# Patient Record
Sex: Male | Born: 1940 | Race: White | Hispanic: No | Marital: Married | Smoking: Former smoker
Health system: Southern US, Community
[De-identification: ages and names within clinical notes are randomized; demographics above are authoritative.]

## PROBLEM LIST (undated history)

## (undated) DIAGNOSIS — M545 Low back pain, unspecified: Secondary | ICD-10-CM

## (undated) DIAGNOSIS — I255 Ischemic cardiomyopathy: Secondary | ICD-10-CM

## (undated) DIAGNOSIS — I509 Heart failure, unspecified: Secondary | ICD-10-CM

## (undated) DIAGNOSIS — I4821 Permanent atrial fibrillation: Secondary | ICD-10-CM

## (undated) DIAGNOSIS — I350 Nonrheumatic aortic (valve) stenosis: Secondary | ICD-10-CM

## (undated) DIAGNOSIS — I499 Cardiac arrhythmia, unspecified: Secondary | ICD-10-CM

## (undated) DIAGNOSIS — E7800 Pure hypercholesterolemia, unspecified: Secondary | ICD-10-CM

## (undated) DIAGNOSIS — I482 Chronic atrial fibrillation, unspecified: Secondary | ICD-10-CM

## (undated) DIAGNOSIS — I2510 Atherosclerotic heart disease of native coronary artery without angina pectoris: Secondary | ICD-10-CM

## (undated) DIAGNOSIS — I10 Essential (primary) hypertension: Secondary | ICD-10-CM

## (undated) DIAGNOSIS — G609 Hereditary and idiopathic neuropathy, unspecified: Secondary | ICD-10-CM

## (undated) DIAGNOSIS — E785 Hyperlipidemia, unspecified: Secondary | ICD-10-CM

## (undated) DIAGNOSIS — I4892 Unspecified atrial flutter: Secondary | ICD-10-CM

## (undated) DIAGNOSIS — G8929 Other chronic pain: Secondary | ICD-10-CM

## (undated) HISTORY — DX: Permanent atrial fibrillation: I48.21

## (undated) HISTORY — PX: BACK SURGERY: SHX140

## (undated) HISTORY — DX: Heart failure, unspecified: I50.9

## (undated) HISTORY — PX: REVISION TOTAL HIP ARTHROPLASTY: SHX766

## (undated) HISTORY — PX: SHOULDER OPEN ROTATOR CUFF REPAIR: SHX2407

## (undated) HISTORY — DX: Hyperlipidemia, unspecified: E78.5

## (undated) HISTORY — DX: Hereditary and idiopathic neuropathy, unspecified: G60.9

## (undated) HISTORY — PX: LUMBAR DISC SURGERY: SHX700

## (undated) HISTORY — DX: Nonrheumatic aortic (valve) stenosis: I35.0

## (undated) HISTORY — DX: Atherosclerotic heart disease of native coronary artery without angina pectoris: I25.10

## (undated) HISTORY — PX: JOINT REPLACEMENT: SHX530

## (undated) HISTORY — PX: CARDIAC CATHETERIZATION: SHX172

---
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
## 1997-09-03 HISTORY — PX: TOTAL HIP ARTHROPLASTY: SHX124

## 1998-01-17 ENCOUNTER — Ambulatory Visit (HOSPITAL_COMMUNITY): Admission: RE | Admit: 1998-01-17 | Discharge: 1998-01-17 | Payer: Self-pay | Admitting: Cardiovascular Disease

## 1998-04-11 ENCOUNTER — Inpatient Hospital Stay (HOSPITAL_COMMUNITY): Admission: AD | Admit: 1998-04-11 | Discharge: 1998-04-12 | Payer: Self-pay | Admitting: Psychiatry

## 1998-05-15 ENCOUNTER — Inpatient Hospital Stay (HOSPITAL_COMMUNITY): Admission: RE | Admit: 1998-05-15 | Discharge: 1998-05-17 | Payer: Self-pay | Admitting: Cardiology

## 1998-08-01 ENCOUNTER — Ambulatory Visit (HOSPITAL_COMMUNITY): Admission: RE | Admit: 1998-08-01 | Discharge: 1998-08-01 | Payer: Self-pay | Admitting: Cardiology

## 1998-08-08 ENCOUNTER — Ambulatory Visit (HOSPITAL_COMMUNITY): Admission: RE | Admit: 1998-08-08 | Discharge: 1998-08-08 | Payer: Self-pay | Admitting: Neurosurgery

## 1998-08-08 ENCOUNTER — Encounter: Payer: Self-pay | Admitting: Neurosurgery

## 1998-09-07 ENCOUNTER — Encounter: Payer: Self-pay | Admitting: Neurosurgery

## 1998-09-09 ENCOUNTER — Encounter: Payer: Self-pay | Admitting: Neurosurgery

## 1998-09-09 ENCOUNTER — Inpatient Hospital Stay (HOSPITAL_COMMUNITY): Admission: RE | Admit: 1998-09-09 | Discharge: 1998-09-10 | Payer: Self-pay | Admitting: Neurosurgery

## 2001-07-16 ENCOUNTER — Encounter: Payer: Self-pay | Admitting: *Deleted

## 2001-07-16 ENCOUNTER — Inpatient Hospital Stay (HOSPITAL_COMMUNITY): Admission: EM | Admit: 2001-07-16 | Discharge: 2001-07-22 | Payer: Self-pay | Admitting: *Deleted

## 2001-07-18 ENCOUNTER — Encounter: Payer: Self-pay | Admitting: Thoracic Surgery (Cardiothoracic Vascular Surgery)

## 2001-07-19 ENCOUNTER — Encounter: Payer: Self-pay | Admitting: Thoracic Surgery (Cardiothoracic Vascular Surgery)

## 2001-07-20 ENCOUNTER — Encounter: Payer: Self-pay | Admitting: Thoracic Surgery (Cardiothoracic Vascular Surgery)

## 2001-08-11 ENCOUNTER — Encounter: Payer: Self-pay | Admitting: Thoracic Surgery (Cardiothoracic Vascular Surgery)

## 2001-08-11 ENCOUNTER — Encounter
Admission: RE | Admit: 2001-08-11 | Discharge: 2001-08-11 | Payer: Self-pay | Admitting: Thoracic Surgery (Cardiothoracic Vascular Surgery)

## 2001-09-03 HISTORY — PX: CORONARY ARTERY BYPASS GRAFT: SHX141

## 2001-09-08 ENCOUNTER — Encounter (HOSPITAL_COMMUNITY): Admission: RE | Admit: 2001-09-08 | Discharge: 2001-10-08 | Payer: Self-pay | Admitting: *Deleted

## 2001-10-09 ENCOUNTER — Encounter (HOSPITAL_COMMUNITY): Admission: RE | Admit: 2001-10-09 | Discharge: 2001-11-08 | Payer: Self-pay | Admitting: *Deleted

## 2001-11-10 ENCOUNTER — Encounter (HOSPITAL_COMMUNITY): Admission: RE | Admit: 2001-11-10 | Discharge: 2001-12-10 | Payer: Self-pay | Admitting: *Deleted

## 2001-11-18 ENCOUNTER — Ambulatory Visit (HOSPITAL_COMMUNITY): Admission: RE | Admit: 2001-11-18 | Discharge: 2001-11-18 | Payer: Self-pay | Admitting: Urology

## 2001-11-18 ENCOUNTER — Encounter: Payer: Self-pay | Admitting: Urology

## 2001-12-12 ENCOUNTER — Encounter (HOSPITAL_COMMUNITY): Admission: RE | Admit: 2001-12-12 | Discharge: 2002-01-11 | Payer: Self-pay | Admitting: *Deleted

## 2002-06-04 ENCOUNTER — Ambulatory Visit (HOSPITAL_COMMUNITY): Admission: RE | Admit: 2002-06-04 | Discharge: 2002-06-04 | Payer: Self-pay | Admitting: Internal Medicine

## 2002-06-04 HISTORY — PX: COLONOSCOPY: SHX174

## 2002-07-24 ENCOUNTER — Encounter: Payer: Self-pay | Admitting: *Deleted

## 2002-07-24 ENCOUNTER — Ambulatory Visit (HOSPITAL_COMMUNITY): Admission: RE | Admit: 2002-07-24 | Discharge: 2002-07-24 | Payer: Self-pay | Admitting: *Deleted

## 2002-11-23 ENCOUNTER — Ambulatory Visit (HOSPITAL_COMMUNITY): Admission: RE | Admit: 2002-11-23 | Discharge: 2002-11-23 | Payer: Self-pay | Admitting: *Deleted

## 2003-05-03 ENCOUNTER — Encounter: Payer: Self-pay | Admitting: Family Medicine

## 2003-05-03 ENCOUNTER — Ambulatory Visit (HOSPITAL_COMMUNITY): Admission: RE | Admit: 2003-05-03 | Discharge: 2003-05-03 | Payer: Self-pay | Admitting: Family Medicine

## 2003-05-27 ENCOUNTER — Ambulatory Visit (HOSPITAL_COMMUNITY): Admission: RE | Admit: 2003-05-27 | Discharge: 2003-05-27 | Payer: Self-pay | Admitting: Orthopedic Surgery

## 2003-06-01 ENCOUNTER — Encounter (HOSPITAL_COMMUNITY): Admission: RE | Admit: 2003-06-01 | Discharge: 2003-07-01 | Payer: Self-pay | Admitting: Orthopedic Surgery

## 2003-07-06 ENCOUNTER — Encounter (HOSPITAL_COMMUNITY): Admission: RE | Admit: 2003-07-06 | Discharge: 2003-08-05 | Payer: Self-pay | Admitting: Orthopedic Surgery

## 2004-01-18 ENCOUNTER — Encounter: Admission: RE | Admit: 2004-01-18 | Discharge: 2004-01-18 | Payer: Self-pay | Admitting: Orthopedic Surgery

## 2004-05-03 ENCOUNTER — Inpatient Hospital Stay (HOSPITAL_COMMUNITY): Admission: RE | Admit: 2004-05-03 | Discharge: 2004-05-06 | Payer: Self-pay | Admitting: Orthopedic Surgery

## 2004-06-20 ENCOUNTER — Ambulatory Visit (HOSPITAL_COMMUNITY): Admission: RE | Admit: 2004-06-20 | Discharge: 2004-06-20 | Payer: Self-pay | Admitting: Urology

## 2005-07-02 ENCOUNTER — Ambulatory Visit: Payer: Self-pay | Admitting: Internal Medicine

## 2005-07-10 ENCOUNTER — Ambulatory Visit (HOSPITAL_COMMUNITY): Admission: RE | Admit: 2005-07-10 | Discharge: 2005-07-10 | Payer: Self-pay | Admitting: Internal Medicine

## 2005-07-25 ENCOUNTER — Ambulatory Visit: Payer: Self-pay | Admitting: Internal Medicine

## 2005-11-23 ENCOUNTER — Ambulatory Visit: Payer: Self-pay | Admitting: Internal Medicine

## 2005-12-12 ENCOUNTER — Ambulatory Visit: Payer: Self-pay | Admitting: Internal Medicine

## 2005-12-19 ENCOUNTER — Ambulatory Visit: Payer: Self-pay | Admitting: Internal Medicine

## 2005-12-19 ENCOUNTER — Ambulatory Visit (HOSPITAL_COMMUNITY): Admission: RE | Admit: 2005-12-19 | Discharge: 2005-12-20 | Payer: Self-pay | Admitting: Internal Medicine

## 2006-01-08 ENCOUNTER — Ambulatory Visit: Payer: Self-pay | Admitting: Internal Medicine

## 2006-01-18 ENCOUNTER — Ambulatory Visit: Payer: Self-pay | Admitting: Internal Medicine

## 2006-03-06 ENCOUNTER — Emergency Department (HOSPITAL_COMMUNITY): Admission: EM | Admit: 2006-03-06 | Discharge: 2006-03-06 | Payer: Self-pay | Admitting: Emergency Medicine

## 2006-07-16 ENCOUNTER — Ambulatory Visit: Payer: Self-pay | Admitting: Internal Medicine

## 2006-09-03 HISTORY — PX: TOTAL HIP ARTHROPLASTY: SHX124

## 2006-09-04 ENCOUNTER — Inpatient Hospital Stay (HOSPITAL_COMMUNITY): Admission: RE | Admit: 2006-09-04 | Discharge: 2006-09-07 | Payer: Self-pay | Admitting: Orthopedic Surgery

## 2010-05-03 ENCOUNTER — Encounter (HOSPITAL_COMMUNITY): Admission: RE | Admit: 2010-05-03 | Discharge: 2010-05-03 | Payer: Self-pay | Admitting: Orthopedic Surgery

## 2010-11-02 DIAGNOSIS — I509 Heart failure, unspecified: Secondary | ICD-10-CM

## 2010-11-02 HISTORY — DX: Heart failure, unspecified: I50.9

## 2010-11-28 ENCOUNTER — Other Ambulatory Visit (HOSPITAL_COMMUNITY): Payer: Self-pay | Admitting: Orthopedic Surgery

## 2010-11-28 ENCOUNTER — Other Ambulatory Visit: Payer: Self-pay | Admitting: Orthopedic Surgery

## 2010-11-28 ENCOUNTER — Ambulatory Visit (HOSPITAL_COMMUNITY)
Admission: RE | Admit: 2010-11-28 | Discharge: 2010-11-28 | Disposition: A | Discharge status: Home / Self Care | Payer: Medicare Other | Source: Ambulatory Visit | Attending: Orthopedic Surgery | Admitting: Orthopedic Surgery

## 2010-11-28 ENCOUNTER — Encounter (HOSPITAL_COMMUNITY): Payer: Medicare Other

## 2010-11-28 DIAGNOSIS — T84038A Mechanical loosening of other internal prosthetic joint, initial encounter: Secondary | ICD-10-CM

## 2010-11-28 DIAGNOSIS — T84099A Other mechanical complication of unspecified internal joint prosthesis, initial encounter: Secondary | ICD-10-CM | POA: Insufficient documentation

## 2010-11-28 DIAGNOSIS — Y831 Surgical operation with implant of artificial internal device as the cause of abnormal reaction of the patient, or of later complication, without mention of misadventure at the time of the procedure: Secondary | ICD-10-CM | POA: Insufficient documentation

## 2010-11-28 DIAGNOSIS — I517 Cardiomegaly: Secondary | ICD-10-CM | POA: Insufficient documentation

## 2010-11-28 DIAGNOSIS — Z01818 Encounter for other preprocedural examination: Secondary | ICD-10-CM | POA: Insufficient documentation

## 2010-11-28 DIAGNOSIS — Z96649 Presence of unspecified artificial hip joint: Secondary | ICD-10-CM

## 2010-11-28 DIAGNOSIS — Z01812 Encounter for preprocedural laboratory examination: Secondary | ICD-10-CM | POA: Insufficient documentation

## 2010-11-28 LAB — COMPREHENSIVE METABOLIC PANEL
AST: 23 U/L (ref 0–37)
Albumin: 4.4 g/dL (ref 3.5–5.2)
Calcium: 9.4 mg/dL (ref 8.4–10.5)
Chloride: 106 mEq/L (ref 96–112)
Creatinine, Ser: 0.8 mg/dL (ref 0.4–1.5)
GFR calc non Af Amer: >60 mL/min (ref >60)
Total Protein: 7.5 g/dL (ref 6.0–8.3)

## 2010-11-28 LAB — URINALYSIS, ROUTINE W REFLEX MICROSCOPIC
Hgb urine dipstick: NEGATIVE
Nitrite: NEGATIVE
Protein, ur: NEGATIVE mg/dL
Specific Gravity, Urine: 1.015 (ref 1.005–1.030)

## 2010-11-28 LAB — CBC
HCT: 43.5 % (ref 39.0–52.0)
Hemoglobin: 14.3 g/dL (ref 13.0–17.0)
RBC: 4.5 MIL/uL (ref 4.22–5.81)
WBC: 5.8 10*3/uL (ref 4.0–10.5)

## 2010-11-28 LAB — PROTIME-INR: Prothrombin Time: 24.4 seconds — ABNORMAL HIGH (ref 11.6–15.2)

## 2010-12-06 ENCOUNTER — Inpatient Hospital Stay (HOSPITAL_COMMUNITY): Payer: Medicare Other

## 2010-12-06 ENCOUNTER — Inpatient Hospital Stay (HOSPITAL_COMMUNITY)
Admission: RE | Admit: 2010-12-06 | Discharge: 2010-12-08 | DRG: 468 | Disposition: A | Discharge status: Home Health | Payer: Medicare Other | Source: Ambulatory Visit | Attending: Orthopedic Surgery | Admitting: Orthopedic Surgery

## 2010-12-06 DIAGNOSIS — I4891 Unspecified atrial fibrillation: Secondary | ICD-10-CM | POA: Diagnosis present

## 2010-12-06 DIAGNOSIS — M1610 Unilateral primary osteoarthritis, unspecified hip: Secondary | ICD-10-CM | POA: Diagnosis present

## 2010-12-06 DIAGNOSIS — Z96649 Presence of unspecified artificial hip joint: Secondary | ICD-10-CM

## 2010-12-06 DIAGNOSIS — T84099A Other mechanical complication of unspecified internal joint prosthesis, initial encounter: Principal | ICD-10-CM | POA: Diagnosis present

## 2010-12-06 DIAGNOSIS — I2510 Atherosclerotic heart disease of native coronary artery without angina pectoris: Secondary | ICD-10-CM | POA: Diagnosis present

## 2010-12-06 DIAGNOSIS — Z951 Presence of aortocoronary bypass graft: Secondary | ICD-10-CM

## 2010-12-06 DIAGNOSIS — Y831 Surgical operation with implant of artificial internal device as the cause of abnormal reaction of the patient, or of later complication, without mention of misadventure at the time of the procedure: Secondary | ICD-10-CM | POA: Diagnosis present

## 2010-12-06 DIAGNOSIS — M169 Osteoarthritis of hip, unspecified: Secondary | ICD-10-CM | POA: Diagnosis present

## 2010-12-06 DIAGNOSIS — Z01812 Encounter for preprocedural laboratory examination: Secondary | ICD-10-CM

## 2010-12-06 LAB — GRAM STAIN

## 2010-12-06 LAB — PROTIME-INR
INR: 1.09 (ref 0.00–1.49)
Prothrombin Time: 14.3 seconds (ref 11.6–15.2)

## 2010-12-06 LAB — TYPE AND SCREEN

## 2010-12-07 LAB — BASIC METABOLIC PANEL
BUN: 13 mg/dL (ref 6–23)
CO2: 28 mEq/L (ref 19–32)
Calcium: 8.9 mg/dL (ref 8.4–10.5)
Chloride: 103 mEq/L (ref 96–112)
Creatinine, Ser: 0.9 mg/dL (ref 0.4–1.5)
GFR calc Af Amer: >60 mL/min (ref >60)
Glucose, Bld: 182 mg/dL — ABNORMAL HIGH (ref 70–99)

## 2010-12-07 LAB — CBC
HCT: 36.6 % — ABNORMAL LOW (ref 39.0–52.0)
MCH: 31.8 pg (ref 26.0–34.0)
MCHC: 33.3 g/dL (ref 30.0–36.0)
MCV: 95.3 fL (ref 78.0–100.0)
Platelets: 152 10*3/uL (ref 150–400)
RDW: 12.5 % (ref 11.5–15.5)
WBC: 8.7 10*3/uL (ref 4.0–10.5)

## 2010-12-08 LAB — BASIC METABOLIC PANEL
BUN: 15 mg/dL (ref 6–23)
Chloride: 108 mEq/L (ref 96–112)
Creatinine, Ser: 0.91 mg/dL (ref 0.4–1.5)
GFR calc non Af Amer: >60 mL/min (ref >60)
Glucose, Bld: 161 mg/dL — ABNORMAL HIGH (ref 70–99)
Potassium: 4.6 mEq/L (ref 3.5–5.1)

## 2010-12-08 LAB — PROTIME-INR: Prothrombin Time: 14.8 seconds (ref 11.6–15.2)

## 2010-12-08 LAB — CBC
Hemoglobin: 10.9 g/dL — ABNORMAL LOW (ref 13.0–17.0)
MCH: 32.3 pg (ref 26.0–34.0)
MCHC: 33.1 g/dL (ref 30.0–36.0)
RDW: 12.9 % (ref 11.5–15.5)

## 2010-12-10 LAB — BODY FLUID CULTURE

## 2010-12-11 LAB — ANAEROBIC CULTURE: Gram Stain: NONE SEEN

## 2010-12-11 NOTE — H&P (Addendum)
NAME:  Leonard, Cisneros NO.:  [id number]  MEDICAL RECORD NO.:  [id number]           PATIENT TYPE:  LOCATION:                                 FACILITY:  PHYSICIAN:  Ollen Gross, M.D.    DATE OF BIRTH:  1941-09-03  DATE OF ADMISSION: DATE OF DISCHARGE:                             HISTORY & PHYSICAL   CHIEF COMPLAINT:  Left hip pain.  BRIEF HISTORY:  Mr. Leonard Cisneros came in to see Dr. Lequita Halt for recheck on the left hip back in March.  He is about 4-1/2 to 5 years out from a left total hip arthroplasty.  He is complaining of worsening pain and he did have documentation of loosening of the femoral component when he was seen in September.  X-rays and bone scan suggested this.  At that point, he was not ready to do anything surgically.  On his visit in March, things have progressed.  He was having more and more pain with weightbearing.  He continues to state that he not really have any pain at rest.  Radiographs taken in March show definitive loosening around the femoral stem as well as broken component of one of the flutes on the femoral stem.  So, he now presents for revision arthroplasty.  In addition, it is requested that C-reactive protein and sed rate be drawn preoperatively to look for signs of infection.  If those levels are elevated preoperatively, Dr. Lequita Halt is requesting a preoperative aspiration of the left hip.  MEDICATION ALLERGIES:  None.  PRIMARY CARE PHYSICIAN:  Donna Bernard, M.D.  CARDIOLOGIST:  Nicki Guadalajara, M.D.  CURRENT MEDICATIONS: 1. Metoprolol. 2. Ramipril. 3. Diltiazem. 4. Warfarin which he stopped 5 days prior to surgery.  PAST MEDICAL HISTORY: 1. History of shingles. 2. Dentures. 3. Atrial fibrillation. 4. Bypass surgery. 5. Thyroid disease.  PAST SURGICAL HISTORY: 1. Right hip replacement in 2000. 2. Back surgery in 2003. 3. Coronary artery bypass in 2004. 4. Rotator cuff surgery. 5. Hip revision arthroplasty on the  right in 2005. 6. Left total hip arthroplasty in 2008.  FAMILY HISTORY:  His father passed at age of 49, he had cancer.  Mother passed at the age of 71, she had respiratory problems.  SOCIAL HISTORY:  The patient is married.  He is retired.  He admits past use of tobacco products.  States that he drinks alcohol socially.  He has 2 children.  He lives at home with his family.  He does plan to go home following his hospital stay.  REVIEW OF SYSTEMS:  GENERAL:  Negative for fevers, chills or weight change.  HEENT:  NEURO:  Positive for hearing loss and dentures. Negative for insomnia or blurred vision.  DERMATOLOGIC:  Negative for rash or lesion.  RESPIRATORY:  Negative for shortness of breath at rest or with exertion.  CARDIOVASCULAR:  Negative for chest pain or palpitations.  GI:  Negative for nausea, vomiting, diarrhea.  GU: Negative for hematuria, dysuria.  MUSCULOSKELETAL:  Positive for back pain and morning stiffness.  PHYSICAL EXAMINATION:  VITAL SIGNS:  Pulse 78, respirations 18, blood pressure 128/76 in the left arm. GENERAL:  Mr. Leonard Cisneros is alert and oriented x3.  He is a pleasant 70 year old male.  He is of stated height of 6 feet 1 inch and a stated weight of 218 pounds.  He is in no apparent distress.  He is accompanied today by his wife. HEENT:  Normocephalic, atraumatic.  Extraocular movements intact.  The patient wears reading glasses. NECK:  Supple.  Full range of motion without lymphadenopathy. CHEST:  Lungs are clear to auscultation bilaterally without wheezes. HEART:  Regular rate and rhythm without murmur. ABDOMEN:  Bowel sounds present in all 4 quadrants. EXTREMITIES:  Left hip is able to be flexed to 100 degrees, 20 degrees of internal rotation, 30 degrees of external rotation and 30 degrees of abduction; all with pain. SKIN:  Unremarkable. NEUROLOGIC:  Intact. PERIPHERAL VASCULAR:  Carotid pulses 2+ bilaterally without bruit.  RADIOGRAPH:  AP and lateral  views of the patient's left hip revealed loosening around the femoral stem and one of the flutes of the closed component of the stem is broken off.  IMPRESSION:  Left hip pain with failure of prosthesis and questionable infection.  PLAN:  Left total hip arthroplasty revision to be performed by Dr. Lequita Halt.     Rozell Searing, PAC   ______________________________ Ollen Gross, M.D.    LD/MEDQ  D:  12/06/2010  T:  12/06/2010  Job:  [id number]  cc:   Donna Bernard, M.D. Fax: [phone]  Nicki Guadalajara, M.D. Fax: [phone]  Electronically Signed by Ollen Gross M.D. on 12/11/2010 06:39:04 AM Electronically Signed by Rozell Searing  on 12/11/2010 08:44:14 AM

## 2010-12-11 NOTE — Op Note (Signed)
NAME:  Leonard Cisneros, Leonard Cisneros NO.:  [id number]  MEDICAL RECORD NO.:  [id number]           PATIENT TYPE:  I  LOCATION:  1605                         FACILITY:  [facility]  PHYSICIAN:  Ollen Gross, M.D.    DATE OF BIRTH:  13-Jan-1941  DATE OF PROCEDURE:  12/06/2010 DATE OF DISCHARGE:                              OPERATIVE REPORT   PREOPERATIVE DIAGNOSIS:  Failed left total-hip arthroplasty.  POSTOPERATIVE DIAGNOSIS:  Failed left total-hip arthroplasty.  PROCEDURE:  Left total-hip arthroplasty revision.  SURGEON:  Ollen Gross, M.D.  ASSISTANT:  Alexzandrew L. Perkins, P.A.C.  ANESTHESIA:  General.  ESTIMATED BLOOD LOSS:  600.  DRAINS:  Hemovac x1.  COMPLICATIONS:  None.  CONDITION:  Stable to recovery room.  BRIEF CLINICAL NOTE:  Leonard Cisneros is a 70 year old male who had a left total- hip arthroplasty done in January 2008.  He did well initially and then over the past year has noticed thigh pain.  X-rays suggested loosening and a bone scan in the fall of last year confirmed loosening of the femoral component.  He was not having a tremendous amount of pain and wanted to hold off on doing anything surgical at the time.  We saw him again about 2 months ago and the pain had increased significantly.  The femoral stem had broken.  He presents now for revision of at least the femoral and possibly total-hip revision.  PROCEDURE IN DETAIL:  After successful administration of general anesthetic, the patient was placed in the right lateral decubitus position with the left side up and held with the hip positioner.  The left lower extremity was isolated from his perineum with plastic drapes and prepped and draped in the usual sterile fashion.  The previous posterolateral incision was re-utilized.  The skin was cut with a 10- blade through subcutaneous tissue to the fascia lata, which was incised in line with the skin incision.  We did not encounter any fluid in that space.   Sciatic nerve was palpated and protected and then the short rotators and capsule were isolated off the femur.  The previous repair had healed well.  When we got into the joint capsule, there was some clear fluid present and some fibrinous debris consistent with finding mechanical loosening.  This fluid was sent for a stat Gram stain, which showed no organisms and no white cells.  The tissue was debrided.  I did not see any metallosis.  We cleared off the rim around the acetabular component and then I dislocated the hip.  I removed the femoral head, which was a 44 +0.  There was a little corrosion at the interface between the head and the neck.  The femoral component was grossly moving within the canal.  I cleared the soft tissue around the proximal femur and then cleared out some overgrowth of the greater trochanter.  I was then able to remove the femoral component easily and the stem and sleeve of the S-ROM together.  The broken portion of the stem, which was one of the "clothespins" of the component was identified and was easily removed with the pituitary rongeur.  We curetted  out the soft tissue from the canal.  There was __________ distally and I drilled through this and then reamed through it.  I passed a long instrument down the canal and found that we were in the femoral canal and did not perforate out of it. I then used the thin shaft reamers to ream up to 19.5 mm for placement of a 19.5 Solution stem.  We then broached up for a large body.  The femur was then retracted anteriorly and gained acetabular exposure. The cup was in excellent position.  I was able to remove the metal liner from the acetabular shell.  There were two dome screws, which were both removed and I removed the apex hole eliminator also.  The impactor was threaded into the cup and the cup and pelvis were moving as a solid unit, thus there was no loosening of the cup.  We then replaced the apex hole eliminator  and placed a 36-mm neutral +4 Marathon polyethylene liner for the 62 acetabular shell.  This was impacted into position.  We then placed the trial 8-inch bowed 19.5 Solution stem with a large proximal body in about 25 degrees of anteversion.  It was then impacted down to the appropriate level and then a 36 +1.5 head was placed.  Hip was reduced with outstanding stability.  There was full extension, full external rotation, 70 degrees flexion, 40 degrees adduction, 90 degrees internal rotation and 90 degrees of flexion and 70 degrees of internal rotation.  The hip was then dislocated and trials were removed.  We then gained access to the proximal femur with retractors and impacted the 8- inches bowed Solution stem which was 19.5 mm with a large proximal body. It was impacted in the same anteversion as the trial and at the same depth as the trial.  We then placed a 36 +1.5 trial head and reduced the hip with the same stability parameters.  We removed the trial and placed the 36 +1.5 permanent head.  The same stability parameters were noted. By placing the left leg on top of the right, it was within about 5 mm of being exactly equal.  The wound was then copiously irrigated with saline solution.  X-rays were taken AP and lateral to show the distal aspect of the stem and I could not see any fractures along the femoral shaft or along the tip of the stem.  The posterior structures were then reattached to the femur through drill holes.  The fascia lata was closed over a Hemovac drain with interrupted #1 Vicryl, subcutaneous closed with #1-0 and #2-0 Vicryl and skin closed with staples.  Drains hooked to suction.  The catheter for the Marcaine pain pump was placed and the pump was initiated.  Incisions were cleaned and dried and a bulky sterile dressing applied.  He was placed into a knee immobilizer, awakened and transferred to recovery in stable condition.     Ollen Gross,  M.D.     FA/MEDQ  D:  12/06/2010  T:  12/07/2010  Job:  [id number]  Electronically Signed by Ollen Gross M.D. on 12/11/2010 06:39:06 AM

## 2011-01-01 NOTE — Discharge Summary (Signed)
NAME:  Leonard Cisneros, Leonard Cisneros NO.:  [id number]  MEDICAL RECORD NO.:  [id number]           PATIENT TYPE:  I  LOCATION:  1605                         FACILITY:  [facility]  PHYSICIAN:  Ollen Gross, M.D.    DATE OF BIRTH:  1941/03/09  DATE OF ADMISSION:  12/06/2010 DATE OF DISCHARGE:  12/08/2010                              DISCHARGE SUMMARY   ADMITTING DIAGNOSES: 1. Failed left total hip arthroplasty. 2. History of shingles. 3. Atrial fibrillation. 4. 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 5. Hypothyroidism.  DISCHARGE DIAGNOSIS: 1. Failed left total hip arthroplasty status post left total hip     arthroplasty revision. 2. History of shingles. 3. Atrial fibrillation. 4. 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 5. Hypothyroidism.  PROCEDURE:  December 06, 2010, revision left total hip arthroplasty. Surgeon Dr. Lequita Halt, assistant, Avel Peace, PA-C.  Anesthesia general.  CONSULTS:  None.  BRIEF HISTORY:  The patient is a 70 year old male with a left total hip done January 2008, initially did well but noticed thigh pain.  X-rays suggested loosening bone scan and confirmed loosening of the femoral component and now presents for hip arthroplasty.  LABORATORY DATA:  Preop CBC is not scanned into the chart at the night of access.  Serial CBCs were followed.  Hemoglobin dropped down to 12.2. Last night, H and H is 10.9 and 32.9.  Serial pro times followed.  Last night, PT/INR 14.8 and 1.14.  Chem panel on admission was not scanned to the chart but BMETs were followed for 48 hours.  Electrolytes remained within normal limits.  Glucose was 182 on postop day #1, back down to 161 on postop day #2.  X-RAYS:  Portable pelvis and hip films show left hip prosthesis and femur films 2 views, no evidence of hardware complication involving the distal aspect of the femoral component.  HOSPITAL COURSE:  The patient was admitted to [facility], taken to OR, underwent above-stated procedure without complication.  The patient tolerated the procedure well, later transferred from recovery room to the orthopedic floor, started on p.o. and IV analgesics, doing very well on the morning of day one.  With the amount of surgery he had, he was placed back on his Coumadin and given Lovenox bridge.  He has a history of atrial fib and he was started back on his home meds except his ACE inhibitor which was held because of his low pressure.  He had excellent urinary output.  Hemovac drain placed at surgery was pulled on day one.  He started getting up and actually did pretty well with physical therapy and wanted to go home after the hospital stay.  He was seen on day 2 and actually doing very well, walking well with his therapy, tolerating his meds.  His INR had not yet become therapeutic but we have decided we will send him home on Lovenox and he was discharged home at that time.  DISCHARGE PLAN: 1. Discharged home on December 08, 2010. 2. Discharge diagnoses, please see above. 3. Discharge meds, Lovenox, Robaxin, Percocet, Coumadin, diltiazem,     metoprolol, ramipril.  FOLLOWUP:  Follow  up in 2 weeks.  ACTIVITY:  Partial weightbearing 25% to 50% hip precaution total protocol.  DIET:  Heart-healthy diet.  DISPOSITION:  Home.  CONDITION ON DISCHARGE:  Improved.     Alexzandrew L. Julien Girt, P.A.C.   ______________________________ Ollen Gross, M.D.    ALP/MEDQ  D:  12/21/2010  T:  12/21/2010  Job:  [id number]  cc:   Nicki Guadalajara, M.D. Fax: [phone]  W. Simone Curia, M.D. Fax: [phone]  Electronically Signed by Patrica Duel P.A.C. on 12/25/2010 07:58:19 AM Electronically Signed by Ollen Gross M.D. on 01/01/2011 07:09:57 AM

## 2011-01-19 NOTE — Op Note (Signed)
   NAME:  Leonard Cisneros, LANGSTON NO.:  [id number]   MEDICAL RECORD NO.:  [id number]                   PATIENT TYPE:  OIB   LOCATION:  2899                                 FACILITY:  [facility]   PHYSICIAN:  Darlin Priestly, M.D.             DATE OF BIRTH:  Apr 19, 1941   DATE OF PROCEDURE:  11/23/2002  DATE OF DISCHARGE:                                 OPERATIVE REPORT   PROCEDURE:  Direct current cardioversion.   INDICATIONS:  The patient is a 70 year old male patient of Kirk Ruths, M.D., with a history of CAD, status post coronary bypass surgery  and chronic atrial fibrillation.  The patient was previously on Rythmol;  however, he continued to have episodes of PAF and ultimately was switched to  amiodarone.  He did recently have a Holter monitor revealing intermittent  episodes of atrial flutter and repeat office visit with recurrent atrial  flutter.  He is now brought for repeat attempt at cardioversion.   DESCRIPTION OF PROCEDURE:  After obtaining informed consent, patient brought  to the short stay C section in a fasting state.  ECG monitoring was  established.  The patient then received sodium penthothal by Sheldon Silvan,  M.D.  After adequate anesthesia, the patient then received one biphasic  shock at 100 joules with restoration of sinus rhythm.  He remained  hemodynamically stable.  He awoke in a satisfactory condition.   CONCLUSION:  Successful direct current cardioversion to sinus rhythm.                                               Darlin Priestly, M.D.    RHM/MEDQ  D:  11/23/2002  T:  11/23/2002  Job:  [id number]   cc:   Kirk Ruths, M.D.  [address]  [place]  [place] [address]  Fax: [phone]

## 2011-01-19 NOTE — Op Note (Signed)
NAME:  THANOS, COUSINEAU NO.:  [id number]   MEDICAL RECORD NO.:  [id number]          PATIENT TYPE:  INP   LOCATION:  X005                         FACILITY:  [facility]   PHYSICIAN:  Ollen Gross, M.D.    DATE OF BIRTH:  1940/12/01   DATE OF PROCEDURE:  09/04/2006  DATE OF DISCHARGE:                               OPERATIVE REPORT   PREOPERATIVE DIAGNOSIS:  Osteoarthritis, left hip.   PROCEDURE:  Left total hip arthroplasty.   SURGEON:  Ollen Gross, M.D.   ASSISTANT:  Alexzandrew L. Julien Girt, P.A.   ANESTHESIA:  General.   ESTIMATED BLOOD LOSS:  300.   DRAINS:  Hemovac x1.   COMPLICATIONS:  None.   CONDITION:  Stable to recovery.   BRIEF CLINICAL NOTE:  Leonard Cisneros is a 70 year old male who has developed  end-stage osteoarthritis of the left hip, now with intractable pain.  He  presents for total hip arthroplasty.  I had previously done an  acetabular revision on his right side.   PROCEDURE IN DETAIL:  After successful initiation of general anesthetic,  the patient is placed in the right lateral decubitus position with the  left side up and held with the hip positioner.  The left lower extremity  is isolated from his perineum with plastic drapes and prepped and draped  in the usual sterile fashion.  A short posterolateral incision is made  with a 10 blade through subcutaneous tissue to the level of fascia lata,  which is incised in line with the skin incision.  The sciatic nerve was  palpated and protected and the short rotators isolated off the femur.  Capsulectomy is performed and the hip is dislocated.  The center of the  femoral head is marked and the trial prosthesis placed such that the  center of the trial head corresponds to the center of his native head.  Osteotomy lines are marked on the femoral neck and osteotomy made with  an oscillating saw.  The head is then removed and the femur retracted  anteriorly to gain acetabular exposure.   Acetabular  labrum and osteophytes are removed.  Reaming begins at 47 mm,  coursing in increments of 2 up to 61 mm and then a 62 mm pinnacle  acetabular shell is placed in anatomic position and transfixed with two  domed screws.  The permanent apex hole eliminator and permanent 44 mm  neutral Ultramet metal liner is placed for a metal-on-metal hip  replacement.   On the femoral side we prepared with the canal finder and irrigation.  Axial reaming is performed to 15.5 mm, proximal reaming to an oversized  22-F and the sleeve is machined to a large.  The 22-F large trial sleeve  with the internal diameter 20 is then placed and then a 20 x 15 stem  with a 36 +12 neck is placed matching native anteversion.  The trial 44-  0 head is placed.  The 0 is a little tight so we went to the 44 -3.  We  had excellent soft tissue tension.  There was excellent stability with  full extension, flexion, and  rotation to 70 degrees flexion, 40 degrees  adduction and about 90 degrees internal rotation in 90 degrees of  flexion and about 70 degrees of internal rotation.  By placing the left  leg on the right, I felt as though the leg lengths are equal.  The  trials are removed and the permanent 20-F large sleeve, this is the  oversized sleeve, is placed with the 20 x 15 stem and 36 +12 neck,  matching native anteversion.  Permanent 44 -3 head is placed.  The hip  is reduced with great stability as in the trial.  The wound is copiously  irrigated with saline solution and short rotators reattached to the  femur through drill holes.  Fascia lata is closed over a Hemovac drain  with interrupted #1 Vicryl, subcu closed with #1 and #2-0 Vicryl and  subcuticular running 4-0 Monocryl.  The drain is hooked to suction,  incision cleaned and dried and Steri-Strips and a bulky sterile dressing  applied.  He is placed into a knee immobilizer, awakened, and  transported to recovery in stable condition.      Ollen Gross, M.D.   Electronically Signed     FA/MEDQ  D:  09/04/2006  T:  09/04/2006  Job:  [id number]

## 2011-01-19 NOTE — Op Note (Signed)
NAME:  Leonard Cisneros, Leonard Cisneros NO.:  [id number]   MEDICAL RECORD NO.:  [id number]                   PATIENT TYPE:  INP   LOCATION:  0004                                 FACILITY:  [facility]   PHYSICIAN:  Ollen Gross, M.D.                 DATE OF BIRTH:  21-Jun-1941   DATE OF PROCEDURE:  05/03/2004  DATE OF DISCHARGE:                                 OPERATIVE REPORT   PREOPERATIVE DIAGNOSES:  Loose acetabular component right total hip.   POSTOPERATIVE DIAGNOSES:  Loose acetabular component right total hip.   PROCEDURE:  Right acetabular revision with bone grafting.   SURGEON:  Ollen Gross, M.D.   ASSISTANT:  Madlyn Frankel. Charlann Boxer, M.D.   ANESTHESIA:  General.   ESTIMATED BLOOD LOSS:  400   DRAINS:  Hemovac x1.   COMPLICATIONS:  None.   CONDITION:  Stable to recovery.   BRIEF CLINICAL NOTE:  Leonard Cisneros is a 70 year old male who had a right total  hip arthroplasty done in [place] approximately 6-7 years ago.  He has had  progressive groin pain over the past year and radiographs show massive  osteolysis with complete loosening of the cup, central defect of the  acetabulum and questionable discontinuity of the pelvis. He presents now for  acetabular and possible total hip revision.   DESCRIPTION OF PROCEDURE:  After successful administration of general  anesthetic, the patient was placed in the left lateral decubitus position  with the right side up and help with the hip positioner. The right lower  extremity was isolated from his perineum with plastic drapes and prepped and  draped in the usual sterile fashion.  A posterolateral incision was made  with a 10 blade through the subcutaneous tissue to the fascia lata which was  incised in line with the skin incision. The sciatic nerve was palpated and  protected and the posterior pseudocapsule removed off the femur.  He did not  have a significant amount of fluid in the joint. There was a tremendous  amount of  osteolytic debris present. I debrided some of that and dislocated  the hip removing the femoral head.  I inspected the femoral component and  although there was some proximal lysis, there was no evidence of any  structural bone loss and the component was found to be stable both through  torsional stresses and axial stresses.  I then retracted the femur  anteriorly to gain acetabular exposure.  The acetabular liner came easily  dislodged from the cup but the cup was grossly loose and easily removed that  just by picking it up and even needing to dislodge it from the bone. We then  assessed the deformity and as a central defect about 3 cm in diameter and  some superior bone loss but no significant loss of either the anterior or  posterior column.  The pelvic columns are contiguous and there is no  discontinuity.  I then reamed centrally with the 57 reamer just to remove  the rest of the membrane off of the bone and subsequently reamed up to a 63.  We placed 30 mL of allograft bone using the I.C. graft chamber bone as well  as symphony graft material.  This effectively filled in the defect and gave  the concentric appearance to the acetabulum.  A 64 acetabular shell pinnacle  is then impacted in anatomic position and transfixed with four dome screws  with excellent purchase.  I put the impactor back into the cup and the  entire pelvis moved as a single unit this indicating stability of the  construct. The trial 32 mm neutral plus 4 liner is placed. He had a PCA  femoral stem and we used the 32 plus 5 PCA head. The hip is reduced with  excellent stability throughout. By placing the right leg on top of the left,  the leg lengths were found to be equal. The trials were then removed and the  permanent 32 mm neutral plus 4 marathon liner was placed into the acetabular  shell and the permanent 32 plus 5 PCA head is placed onto the femoral stem.  The hip is then reduced with the same stability  parameters. The wound was  copiously irrigated with antibiotic solution and the posterior tissue was  reattached to the femur through drill holes. The fascia lata was closed over  a Hemovac drain with interrupted #1 Vicryl, subcu closed with #1 and 2-0  Vicryl and skin with staples. Drains hooked to suction. Incision cleaned and  dried and bulky sterile dressing applied. He was placed into a knee  immobilizer, awakened and transported to recovery in stable condition.                                               Ollen Gross, M.D.    FA/MEDQ  D:  05/03/2004  T:  05/04/2004  Job:  [id number]

## 2011-01-19 NOTE — H&P (Signed)
NAME:  Leonard Cisneros, Leonard Cisneros NO.:  [id number]   MEDICAL RECORD NO.:  [id number]          PATIENT TYPE:  INP   LOCATION:  NA                           FACILITY:  [facility]   PHYSICIAN:  Ollen Gross, M.D.    DATE OF BIRTH:  12/14/1940   DATE OF ADMISSION:  09/04/2006  DATE OF DISCHARGE:                              HISTORY & PHYSICAL   DATE OF OFFICE VISIT/HISTORY AND PHYSICAL:  08/13/2006   CHIEF COMPLAINT:  Left hip pain.   HISTORY OF PRESENT ILLNESS:  The patient is a 70 year old male who has  been seen by Dr. Lequita Halt for ongoing left hip pain.  He is known to have  end-stage arthritis.  The left hip pain has been progressive in nature.  He has undergone an intra-articular injection back in the summer of this  past year.  He states the injection made a big difference, however was  only limited in its relief.  The hip pain has continued to progress in  nature, to the point where he would like to have something done about  it.  He has been seen preoperatively by Cardiology and felt to be stable  for up-and-coming surgery.  He has had a recent myocardial perfusion  scan.  Post-stress myocardial perfusion scan showed a normal pattern of  perfusion in all the regions, felt to be essentially a low-risk scan.  No significant ischemia was demonstrated.  It was compared to a previous  study, where there was no significant change.  Risks and benefits of the  procedure have been discussed with the patient, and he would like to  proceed with surgery.   ALLERGIES:  No known drug allergies.   CURRENT MEDICATIONS:  1. Cardizem.  2. Altace.  3. Metoprolol.  4. Coumadin, which will be stopped prior to surgery.   PAST MEDICAL HISTORY:  Hypertension.  Hypercholesterolemia.  Coronary  arterial disease.  History of paroxysmal atrial fibrillation.  Transient  hyperthyroidism secondary to medications, but this was resolved.   PAST SURGICAL HISTORY:  He has had a cardiac  catheterization, which led  to coronary artery bypass grafting of three vessels.  He has had a right  total hip in 1999.  He had lumbar surgery in 2000, right rotator cuff  repair in 2001, revision of the right total hip in August 2005.  He has  also had a cardiac ablation procedure.  He has recently undergone a  cardiac stress test, and he has a previously documented ejection  fraction of about 61%, but this was dating back to 2005.   SOCIAL HISTORY:  Married.  Event organiser, Management consultant.  Nonsmoker.  Occasional beer and wine.  Two children.   FAMILY HISTORY:  Mother deceased, age 14, with history of emphysema.  Father deceased, age 88, with a history of cancer.  Brother deceased  with a history of heart disease and bypass grafting at age 69.   REVIEW OF SYSTEMS:  GENERAL:  No fevers, chills, night sweats.  NEUROLOGIC:  No seizures, syncope, paralysis.  RESPIRATORY:  No shortness of breath, productive cough, or hemoptysis.  CARDIOVASCULAR:  No chest pain, PND, orthopnea.  GI:  No nausea, vomiting, diarrhea, constipation.  GU:  No dysuria, hematuria, discharge.  MUSCULOSKELETAL:  Left hip.   PHYSICAL EXAMINATION:  VITAL SIGNS:  Pulse 80, respirations 12, blood  pressure 122/78.  GENERAL:  [age]-year-old white male, well nourished, well  developed, in no acute distress.  He is alert, oriented, and  cooperative, appears to be a good historian.  HEENT:  Normocephalic, atraumatic.  Pupils are round and reactive.  Oropharynx clear.  EOMs intact.  NECK:  Supple.  No carotid bruits are appreciated.  HEART:  He does have an irregular rhythm, sounds like somewhat of a  split S1 and normal S2, secondary to ectopic beats, which is felt to be  due to his atrial fibrillation.  ABDOMEN:  Soft, nontender.  Bowel sounds present.  RECTAL/BREASTS/GENITALIA:  Not done--not pertinent to present illness.  EXTREMITIES:  Left hip:  Flexion 90, zero internal rotation, zero  external  rotation, abduction/adduction of 30 degrees, antalgic gait.   IMPRESSION:  1. Osteoarthritis, left hip.  2. Hypertension.  3. Hypercholesterolemia.  4. Coronary arterial disease.  5. History of transient hyperthyroidism secondary to medications, but      this is resolved.  6. Paroxysmal atrial fibrillation.   PLAN:  Patient admitted to [facility] to undergo a left total  hip arthroplasty.  Surgery will be performed by Dr. Ollen Gross.  He  has been seen by Dr. Jenne Campus preoperatively.  His Coumadin was stopped  preoperatively 5 days, and he was placed on Lovenox for 4 days, subcu  injection, per recommendations by Dr. Jenne Campus, and he will undergo  Lovenox and Coumadin treatment postoperatively until his INR is  therapeutic.  Dr. Jenne Campus will also be notified __________  be consulted  to assist with cardiac management of the patient in the postoperative  period.      Alexzandrew L. Julien Girt, P.A.      Ollen Gross, M.D.  Electronically Signed    ALP/MEDQ  D:  09/03/2006  T:  09/03/2006  Job:  [id number]   cc:   Darlin Priestly, MD  Fax: [phone]

## 2011-01-19 NOTE — Op Note (Signed)
NAME:  Leonard, Cisneros NO.:  [id number]   MEDICAL RECORD NO.:  [id number]                   PATIENT TYPE:  AMB   LOCATION:  DAY                                  FACILITY:  [facility]   PHYSICIAN:  Vickki Hearing, M.D.           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  He was referred to [place] by Dr. Gerda Diss.  He had increasing pain over the last  one to two months.  It became really bad, and he sought medical attention by  Dr. Gerda Diss.  He was sent for an MRI and started on Vicodin for pain relief.  His MRI showed a partial rotator cuff tear, a large AC joint inferior  impinging spur, and he had obvious impingement syndrome with rotator cuff  weakness.   INDICATIONS FOR PROCEDURE:  Pain and weakness of the right shoulder.   PREOPERATIVE DIAGNOSIS:  Partial rotator cuff tear, impingement syndrome,  osteoarthritis of the acromioclavicular joint, right shoulder.   POSTOPERATIVE DIAGNOSIS:  Partial rotator cuff tear, impingement syndrome,  osteoarthritis of the acromioclavicular joint, right shoulder.   PROCEDURE:  Open rotator cuff repair and distal clavicle excision.   SURGEON:  Vickki Hearing, M.D.   ASSISTANT:  None.   ANESTHESIA:  General.   OPERATIVE FINDINGS:  There was a superior partial tear of his rotator cuff,  but greater than 50%.  It was a longitudinal tear.  He had a large  acromioclavicular spur inferiorly impinging on the rotator cuff.   DESCRIPTION OF PROCEDURE:  The patient was identified as Leonard Cisneros.  His right shoulder was marked for surgery.  His medical record and consent  form were used to confirm the proper procedure.  He was given Ancef and  taken to the operating room for general  anesthesia.  He was placed in the  beachchair position.  He was prepped and draped using sterile technique.  Time out was taken.  The procedure and patient were confirmed to be correct.   We proceeded with an anterior incision extended over the [facility] joint and into  the anterior third of the deltoid and middle third raphe.  The subcutaneous  tissue was divided down to the deltoid fascia.  The deltoid fascia was split  in continuity with the superior musculature and periosteum.  Subperiosteal  dissection exposed the AC joint.  The distal clavicle was taken  approximately 1 cm.  Bone wax was placed over the distal end of the bone.  A  rasp was used to smooth out the inferior surface of the clavicle.  Acromioplasty was performed as well.  The rasp was also used to flatten and  smooth it.  The [facility] ligament was released.   At this time, inspection of the rotator cuff was performed.  There was a  longitudinal tear in the supraspinatus tendon.  There was no detachment.  The humeral head was completely covered when viewed in internal, neutral,  and external rotation.   Side-to-side sutures were placed, #4, with #2 Tycron suture.   The wound was irrigated, and 10 cc of 0.5% Sensorcaine was placed in the  joint through the rotator cuff, and the remaining 20 were placed in the  subcutaneous tissue.  Layered closure was done with interrupted  nonabsorbable sutures for the subperiosteal and deltoid repair and then a 2-  0 Vicryl was used to close the subcutaneous tissue.  Staples were used to  close the skin.  A sterile dressing  and shoulder immobilizer were applied.   The patient was extubated and taken to the recovery room in stable  condition.   PLAN:  The plan is for him to go home unless there is a pain control issue.  If that is noted, he will be kept for observation.      ___________________________________________                                            Vickki Hearing, M.D.    SEH/MEDQ  D:  05/27/2003  T:  05/27/2003  Job:  [id number]

## 2011-01-19 NOTE — Op Note (Signed)
NAME:  Leonard Cisneros, Leonard Cisneros                        ACCOUNT NO.:  [id number]   MEDICAL RECORD NO.:  [id number]                   PATIENT TYPE:  AMB   LOCATION:  DAY                                  FACILITY:  [facility]   PHYSICIAN:  Gerrit Friends. Rourk, M.D.               DATE OF BIRTH:  [birth year]   DATE OF PROCEDURE:  06/04/2002  DATE OF DISCHARGE:                                 OPERATIVE REPORT   PROCEDURE:  Screening colonoscopy.   ENDOSCOPIST:  Gerrit Friends. Rourk, M.D.   INDICATIONS FOR PROCEDURE:  The patient is a 70 year old gentleman devoid of  any lower GI tract symptoms.  He has never had his lower GI tract imaged,  referred for screening colonoscopy.  No family history of colorectal  neoplasia.  Colonoscopy is now being done as a screening maneuver.  He is on  Coumadin for atrial fibrillation which he stopped taking 4 days earlier.  His ProTime today is 13.0.  Colonoscopy is now being done as a screening  maneuver.  This approach has been discussed with the patient at length.  The  potential risks, benefits, and alternatives have been reviewed, questions  answered.  He is agreeable.  Please see my handwritten H&P for more  information.   DESCRIPTION OF PROCEDURE:  The patient was placed in the left lateral  decubitus position.  O2 saturation, blood pressure, pulse and respirations  were monitored throughout the entire procedure.   CONSCIOUS SEDATION:  Versed 2 mg IV, Demerol 50 mg IV in divided doses.   INSTRUMENT:  Olympus video chip adult colonoscope.   FINDINGS:  Digital rectal exam revealed no abnormalities.   ENDOSCOPIC FINDINGS:  The prep was good.   RECTUM:  Examination of the rectal mucosa including a retroflex view of the  anal verge revealed no abnormalities.   COLON:  The colonic mucosa was surveyed from the rectosigmoid junction  through the left transverse and right colon to the area of the appendiceal  orifice, ileocecal valve, and cecum.  These structures were  well seen and  photographed for the record.  The colonic mucosa appeared entirely normal to  the cecum.   From the level of the cecum and ileocecal valve the scope was slowly  withdrawn.  All previously mentioned mucosal surfaces were again seen; and,  again, no abnormalities were observed.  The patient tolerated the procedure  well and was reacted in endoscopy.   IMPRESSION:  1. Normal colon.  2. Normal rectum.    RECOMMENDATIONS:  1. Repeat colonoscopy in 10 years.  2. The patient is to resume his Coumadin today.                                               Gerrit Friends. Rourk, M.D.    RMR/MEDQ  D:  06/04/2002  T:  06/05/2002  Job:  [id number]   cc:   Dr. Gerda Diss

## 2011-01-19 NOTE — Discharge Summary (Signed)
NAME:  Leonard Cisneros, Leonard Cisneros NO.:  [id number]   MEDICAL RECORD NO.:  [id number]          PATIENT TYPE:  OIB   LOCATION:  6522                         FACILITY:  [facility]   PHYSICIAN:  Doylene Canning. Ladona Ridgel, M.D.  DATE OF BIRTH:  11-26-1940   DATE OF ADMISSION:  12/19/2005  DATE OF DISCHARGE:  12/20/2005                                 DISCHARGE SUMMARY   This patient has no known drug allergies.   PRINCIPAL DIAGNOSIS:  1.  Discharging day one status post electrophysiology study/radiofrequency      catheter ablation of typical atrial flutter.  2.  Failed amiodarone therapy secondary to visual disturbances.   SECONDARY DIAGNOSES:  1.  History of coronary artery disease status post coronary artery bypass      graft surgery 2003.  2.  Degenerative joint disease status post right hip surgery 1999 with      revision in 2006.  3.  Hypertension.  4.  Dyslipidemia.  5.  Ongoing Coumadin therapy.   PROCEDURE:  December 19, 2005:  Electrophysiology study/radiofrequency catheter  ablation of typical counterclockwise reentry tachycardia with restoration of  sinus rhythm.  Bidirectional block was created at the atrial flutter  isthmus, Dr. Lewayne Bunting.  Patient has had no post procedural  complications.   BRIEF HISTORY:  Leonard Cisneros is a 70 year old male.  He has a history of  coronary artery disease.  He is status post CABG.  He has a history of  atrial flutter.  This has been treated with amiodarone but he developed  vision problems and this was discontinued.  The patient has now developed a  recurrence of atrial flutter.  He was seen by Dr. Jenne Campus and referred for  additional evaluation and treatment.   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  Treatment  options have been discussed with the patient.  He wishes to proceed with  electrophysiology study and catheter ablation.  Risks and benefits have been  described to the patient.  He wishes to proceed.   HOSPITAL COURSE:  Patient presented electively on April 18 to [facility].  He underwent electrophysiology study and radiofrequency catheter  ablation as mentioned above by Dr. Lewayne Bunting.  Patient has been observed  for a post procedure period of about 18 hours.  He has remained in sinus  rhythm, has no complaints.  His right groin has no hematoma.  His vital  signs have been stable.  His laboratory studies for this admission:  White  cells 5.3, hemoglobin 14.7, hematocrit 42, platelets 145.  PT was 22.3, INR  3.  Serum electrolytes:  Sodium 141, potassium 4.2, chloride 106, carbonate  27, glucose 110, BUN 12, creatinine 0.7.  Patient discharges on the  following diet:  A low sodium, low cholesterol diet.  He is asked not to  lift anything heavier than 10 pounds the next two weeks and to avoid driving  for the next two days.   DISCHARGE MEDICATIONS:  1.  Cardizem 240 mg daily.  2.  Metoprolol 100 mg daily.  3.  Coumadin 10 mg daily  except for 15 mg Monday, Wednesday, Friday.  4.  UroXatral 10 mg daily.  5.  Altace 2.5 mg daily.   He has follow-up with Dr. Lewayne Bunting at [facility] [address] and this will be for Wednesday, May 18 at 4:00.      Maple Mirza, P.A.    ______________________________  Doylene Canning. Ladona Ridgel, M.D.    GM/MEDQ  D:  12/20/2005  T:  12/21/2005  Job:  [id number]   cc:   Leonard Cisneros, M.D.  Fax: [phone]   Darlin Priestly, MD  Fax: [phone]

## 2011-01-19 NOTE — H&P (Signed)
NAME:  Leonard Cisneros, Leonard Cisneros NO.:  [id number]   MEDICAL RECORD NO.:  [id number]                   PATIENT TYPE:  INP   LOCATION:  NA                                   FACILITY:  [facility]   PHYSICIAN:  Ollen Gross, M.D.                 DATE OF BIRTH:  10/14/1940   DATE OF ADMISSION:  05/03/2004  DATE OF DISCHARGE:                                HISTORY & PHYSICAL   Date of office visit and history and physical:  April 27, 2004.   CHIEF COMPLAINT:  Right hip pain.   HISTORY OF PRESENT ILLNESS:  The patient is a 70 year old male seen by Dr.  Lequita Halt for ongoing progressive right hip pain.  He states he had a hip  replacement done in 1999 by Dr. Cecelia Byars.  He initially did well but over  the past year or so he has had slight discomfort that has worsened over the  past several months.  He describes the pain as laterally and in the groin.  It occurs at rest as well as with activity, hurts with every step.  Lately  it has been more difficult to walk.  He is not having any opposite-side hip  pain, no back pain, no lower extremity weakness.  He is seen in the office,  and x-rays and CT which were initially ordered by Dr. Madelon Lips were brought  over with the patient.  X-ray shows well-ingrowth femoral component with no  evidence of loosening, but the acetabular component looks grossly loose with  migration superior medially.  It is felt that he has a grossly unstable  acetabular component and he felt he would benefit from undergoing surgical  intervention.  Risks and benefits have been discussed and the patient is  subsequently admitted to the hospital.  He has been seen by Darlin Priestly, M.D. preoperatively, who felt he is at low cardiovascular risk.   ALLERGIES:  No known drug allergies.   CURRENT MEDICATIONS:  Cardizem, amiodarone, Altace, Lipitor, metoprolol.   PAST MEDICAL HISTORY:  1. Hypertension.  2. Hypercholesterolemia.  3. Coronary arterial  disease.   PAST SURGICAL HISTORY:  1. Cardiac catheterization, status post coronary artery bypass grafting of     three vessels.  2. Right total hip arthroplasty in 1999.  3. Lumbar surgery in 2000.  4. Right rotator cuff repair in October 2004.   SOCIAL HISTORY:  Married.  He works as an Art gallery manager.  Nonsmoker.  Occasional wine and beer daily.  Two children.  He has  a two-story home with 12 steps.   FAMILY HISTORY:  Mother deceased at age 75 with a history of emphysema.  Father deceased, age 16, with a history of cancer.  He also has a brother  deceased with a history of heart disease and CABG at age 6.   REVIEW OF SYSTEMS:  GENERAL:  No fevers, chills, night  sweats.  NEUROLOGIC:  No seizure, syncope, or paralysis.  RESPIRATORY:  No shortness of breath,  productive cough, hemoptysis.  CARDIOVASCULAR:  No chest pain, angina, or  orthopnea.  GASTROINTESTINAL:  No nausea, vomiting, diarrhea, or  constipation.  GENITOURINARY:  No dysuria, hematuria, or discharge.  MUSCULOSKELETAL:  Pertinent to the hip, found in the history of present  illness.   PHYSICAL EXAMINATION:  VITAL SIGNS:  Pulse 56, respirations 12, blood  pressure 122/68.  GENERAL:  A 70 year old white male, well-developed, well-nourished, large-  framed.  He is alert and oriented and cooperative.  Appears to be a good  historian.  HEENT:  Normocephalic, atraumatic.  Pupils equal, round, and reactive to  light.  Oropharynx clear.  EOMs are intact.  NECK:  Supple, no carotid bruits.  CHEST:  Clear anterior and posterior chest walls.  No rhonchi, rales, or  wheezing.  CARDIAC:  He has a bradycardic rhythm, a pulse of 56, although it is a  regular rhythm.  No murmurs, rubs, thrills, palpitations.  ABDOMEN:  Soft, nontender, bowel sounds present.  RECTAL, BREASTS, GENITALIA:  Not done, not pertinent to present illness.  EXTREMITIES:  Right hip:  He only has hip flexion of about 110 degrees,   internal rotation of 20, external rotation of 30, abduction of 30.  He does  ambulate with a moderately antalgic gait.  Motor function is intact.   IMPRESSION:  1. Loose right total hip arthroplasty.  2. Hypertension.  3. Hypercholesterolemia.  4. Coronary arterial disease.  5. Status post coronary artery bypass graft of three vessels.  6. Left ventricular systolic function estimated at approximately 61% per     stress test.   PLAN:  The patient will be admitted to [facility] to undergo  revision right total hip arthroplasty.  Surgery will be performed by Ollen Gross, M.D.     Alexzandrew L. Julien Girt, P.A.              Ollen Gross, M.D.    ALP/MEDQ  D:  04/27/2004  T:  04/28/2004  Job:  [id number]   cc:   Donna Bernard, M.D.  [address]  [place]  [place] [address]  Fax: [phone]   Darlin Priestly, M.D.  [phone] [address], [address]  [place]  [place] [address]  Fax: [phone]

## 2011-01-19 NOTE — Op Note (Signed)
NAME:  Leonard Cisneros, Leonard Cisneros NO.:  [id number]   MEDICAL RECORD NO.:  [id number]          PATIENT TYPE:  OIB   LOCATION:  6522                         FACILITY:  [facility]   PHYSICIAN:  Doylene Canning. Ladona Ridgel, M.D.  DATE OF BIRTH:  1940/10/31   DATE OF PROCEDURE:  12/19/2005  DATE OF DISCHARGE:                                 OPERATIVE REPORT   PROCEDURES PERFORMED:  1.  Electrophysiology study; and,  2.  Catheter ablation of atrial flutter.   CARDIOLOGIST:  Doylene Canning. Ladona Ridgel, M.D.   INDICATIONS:  The patient is an 70 year old male with known coronary disease  status post bypass surgery and a history of atrial flutter for many years.  He was treated with amiodarone, but developed vision problems and had the  amiodarone discontinued.  He developed recurrent atrial flutter and was  referred for ablation.   DESCRIPTION OF THE PROCEDURE:  After informed consent was obtained the  patient was brought to the diagnostic EP lab in the fasting state.  After  the usual preparation and draping intravenous fentanyl and midazolam were  given for sedation.  A 6 French hexapolar catheter was inserted  percutaneously into the right jugular vein and advanced to the coronary  sinus.  A 7 [place] __________  halo catheter was inserted percutaneously in  the right femoral vein and advanced to the right atrium.  A 5 French  quadripolar catheter was inserted percutaneously in the right femoral vein  and advanced to the right atrium.  Mapping was subsequently was carried out.  The patient was in sinus rhythm.  Programmed atrial stimulation was carried  out from the coronary sinus with a paced cycle length of 600 msec.  The S1  and S2 interval was stepwise decreased from 540 msec down to 320 msec where  an AV nodal ERP was observed.  During programmed atrial stimulation there  were no AV shunts, no echo beats and no inducible SVT.   Next, rapid atrial pacing was carried out in the coronary sinus and the  high  right atrial at a paced cycle length of 600 msec, which was stepwise  decreased down to 380 msec where AV Wenckebach was observed.  During rapid  atrial pacing the P-R interval was less than the R interval and there was no  inducible SVT.  Next, additional rapid atrial pacing was carried out from  the coronary sinus at 290 msec and was stepwise decreased down to 180 msec  where atrial flutter was induced.  This was typical counterclockwise atrial  flutter with a cycle length of 230 msec.  Mapping was carried out  demonstrating typical tricuspid annular re-entry.   A 7 French quadripolar ablation catheter was then maneuvered into the right  atrium and mapping was carried out. This demonstrated the usual size and  orientation the atrial flutter isthmus.  Four radiofrequency energy  applications were delivered resulting in restoration of sinus rhythm and  creation of bidirectional isthmus block.  A single bonus radiofrequency  energy application was delivered and the patient was observed for 30  minutes, and had no recurrent atrial  flutter isthmus conduction.   At this point rapid ventricular pacing was carried out from the RV apex by  way of the ablation catheter and stepwise decreased down to 310 msec where  PA Wenckebach was observed.  During rapid ventricular pacing the __________  was midline and decremental. Next, programmed ventricular stimulation was  carried out from the RV apex to the paced cycle length of 500 msec.  The S1  and S2 interval were stepwise decreased from 440 msec down to 250 msec with  ventricular contractions observed.  During programmed stimulation the atrial  activation was again midline and decremental.  At this point additional  rapid atrial pacing was carried out demonstrating residual isthmus block.   The catheters were removed.  Hemostasis was assured.   The patient was returned to his room in satisfactory condition.   COMPLICATIONS:  There were no  immediately procedural complications.   RESULTS:  Echocardiogram:  Baseline ECG demonstrates sinus rhythm with  normal axis and intervals.   Baseline Intervals:  The sinus node cycle length was 709 msec.  H-V interval  54 msec.  QRS duration was 80 msec.   Rapid Ventricular Pacing:  Rapid ventricular pacing following ablation  demonstrated a V-A Wenckebach cycle length of 310 msec.  During rapid  ventricular pacing the atrial activation was midline and decremental.   Programmed Stimulation:  Programmed stimulation was carried out from the RV  apex and had a basic __________  cycle length of 500 msec.  The S1 and S2  interval was stepwise decreased from 440 msec down to 250 msec where  ventricular contractions were observed.  During programmed ventricular  stimulation the atrial activation was midline and decremental.   Rapid Atrial Pacing:  Rapid atrial pacing was carried out from the coronary  sinus and the high right atrium, and paced rapid cycle lengths of 600 msec  and stepwise decreased down to 380 msec where A-V Wenckebach was observed.  Additional decrements were carried out down to 180 msec where atrial flutter  was induced.   Programmed Atrial Stimulation:  Programmed atrial stimulation was carried  out from the coronary sinus to a paced cycle length of 600 msec.  The S1 and  S2 interval was stepwise decreased from 540 msec down to 320 msec where the  AV node ERP was observed.  During programmed atrial stimulation V-A  reactivation was midline and decremental.   __________  rhythm was observed from atrial flutter initiation, after atrial  pacing duration sustained, cycle length 230 msec and method of termination  was with catheter ablation.   A-H Mapping:  Mapping of the atrial flutter demonstrated typical  counterclockwise tricuspid annular re-entry atrial flutter with the usual size and orientation of the atrial flutter isthmus.   Radiofrequency Energy Application:  A  total of five radiofrequency energy  applications were delivered.  During the fourth radiofrequency energy  application sinus rhythm was restored and atrial flutter isthmus block was  demonstrated.  A single bonus radiofrequency energy application was  delivered.  The patient was observed for 30 minutes.   CONCLUSION:  The study demonstrated successful electrophysiologic study and  radiofrequency catheter ablation of typical atrial flutter with a total of  five radiofrequency energy applications delivered at the usual atrial  flutter isthmus resulting in termination of atrial flutter, restoration of  sinus rhythm, increased bidirectional block and the atrial flutter isthmus.           ______________________________  Doylene Canning. Ladona Ridgel, M.D.  GWT/MEDQ  D:  12/19/2005  T:  12/20/2005  Job:  [id number]   cc:   Darlin Priestly, MD  Fax: [phone]   W. Simone Curia, M.D.  Fax: [phone]

## 2011-01-19 NOTE — Consult Note (Signed)
[place]  Patient:    Leonard Cisneros, Leonard Cisneros Visit Number: [id number] MRN: [id number]          Service Type: MED Location: [phone] Attending Physician:  Darlin Priestly Dictated by:   Salvatore Decent Cornelius Moras, M.D. Proc. Date: 07/17/01 Admit Date:  07/16/2001   CC:         [name], M.D.  [name], M.D.   Consultation Report  REQUESTING PHYSICIAN:  [name], M.D.  PRIMARY CARE PHYSICIAN:  [name], M.D.  REASON FOR CONSULTATION:  Severe two-vessel coronary artery disease with class II progressive angina and positive stress Cardiolite exam.  HISTORY OF PRESENT ILLNESS:  Mr. Leonard Cisneros is a 70 year old gentleman from [place] with history of atrial flutter originally diagnosed in 1999 for which was initially followed by Armanda Magic, M.D.  Apparently the patient had multiple failed cardioversion and ultimately was treated with Rythmol and returned to sinus rhythm.  He presented in October with progressive symptoms of dyspnea on exertion and exertional chest discomfort.  He underwent stress Cardiolite exam on July 14, 2001, which documented anterior wall ischemia associated with chest pain and recurrent atrial flutter.  The patient was subsequently was admitted to the hospital and underwent cardiac catheterization today.  This was performed by Dr. Jenne Campus and demonstrated severe two-vessel coronary artery disease with mild to moderate left ventricular dysfunction.  Percutaneous coronary intervention is felt not to be appropriate for anatomical concerns and cardiac surgical consultation is requested for revascularization.  REVIEW OF SYSTEMS:  Notable for the evidence of any chest pain or shortness of breath occurring at rest.  The patient has otherwise been fairly healthy and active physically.  He reports class II symptoms of progressive exertional shortness of breath and chest pain.  He has occasional palpitations,  but denies any previous syncopal episodes or near syncopal spells.  He reports no problems with orthopnea, PND, or lower extremity edema.  The remainder of his review of systems is notable for the following categories and associated data. GENERAL:  The patient is otherwise well and reports no recent change in appetite or constitutional symptoms.  He does have progressive exertional fatigue.  RESPIRATORY:  Negative, notable for the absence of any productive cough, wheezing, or hemoptysis.  GASTROINTESTINAL:  Notable for occasional dyspepsia.  The patient reports normal bowel function with no history of hematemesis, hematochezia, melena, or constipation.  NEUROLOGIC:  Negative. The patient specifically denies any history of TIA or amaurosis fugax. MUSCULOSKELETAL:  Notable for arthritis involving his right hip, right knee, and lower back.  This is stable from a symptomatic standpoint and not limiting significantly.  GENITOURINARY:  Negative.  The patient specifically denies any dysuria or urinary frequency.  INFECTIOUS:  The patient specifically denies any fevers or chills.  HEMATOLOGIC:  Negative and notable for the absence of any problems with bleeding diaphysis, frequent epistaxis, or easy bruising. ENDOCRINE:  Negative.  PSYCHIATRIC:  Negative.  PERIPHERAL VASCULAR:  Negative and notable for the absence of any symptoms of claudication.  HEENT:  Negative and notable for normal dentition.  The patient wears glasses for reading purposes.  His eye sight has not changed recently.  PAST MEDICAL HISTORY: 1. History of atrial fibrillation, atrial flutter as previously described.    The patient otherwise has no previous cardiac history and underwent cardiac    catheterization in March 1999 which revealed insignificant coronary disease    at that time.  He has no documented history of myocardial infarction  or    previous episode of congestive heart failure. 2. Hypertension.  His lipid status is  unknown.  He does not have diabetes.  He reports no previous stroke.  FAMILY HISTORY:  Notable in that he had one brother who suffered myocardial infarction and underwent bypass surgery in his late 9s.  There is otherwise no history of significant coronary artery disease in his family.  SOCIAL HISTORY:  The patient lives with his wife and works as a Brewing technologist for houses.  He is very active physically.  He is a nonsmoker.  He has history of some smoking in the remote past, although he quit completely more than 35 years ago.  He denies any history of any excessive alcohol consumption.  MEDICATIONS PRIOR TO ADMISSION: 1. Rythmol 300 mg p.o. 3 x q.d. 2. Cardizem CD 300 mg p.o. 1 x q.d. 3. Enteric coated aspirin 325 mg p.o. 1 x q.d. 4. He takes a multivitamin q.d.  ALLERGIES:  No known drug allergies or sensitivities.  PHYSICAL EXAMINATION:  VITAL SIGNS:  Heart rate in 70s and on channel telemetry monitor the patient is now in atrial flutter with variable ventricular response.  He is afebrile and normotensive.  GENERAL:  Well-appearing white male who appears his stated age in no acute distress.  SKIN:  Clean and dry and healthy-appearing throughout.  HEENT:  Within normal limits.  NECK:  Supple.  There is no cervical, supraclavicular lymphadenopathy.  There is no jugulovenous distention.  There are no carotid bruits.  LUNGS:  Auscultation of the chest reveals clear and symmetrical breath sounds bilaterally.  No wheezes or rhonchi noted.  HEART:  Irregular rhythm.  No murmurs, rubs or gallops are noted.  ABDOMEN:  Soft and nontender.  There is mild obesity.  No palpable masses are identified.  The liver edge is not enlarged.  EXTREMITIES:  Warm and well perfused.  There is no lower extremity edema. Distal pulses are palpable in both lower legs at the ankles.  RECTAL/GU:  Deferred.   NEUROLOGIC:  Grossly nonfocal and symmetric  throughout.  DIAGNOSTIC TESTS:  Cardiac catheterization performed today is reviewed.  This demonstrates severe two-vessel coronary artery disease with mild to moderate left ventricular dysfunction.  Specifically, there is severe high-grade proximal stenosis of the left anterior descending coronary artery disease with 99% stenosis.  There is 99% ostial stenosis of a medium sized ramus intermediate branch.  The left circumflex coronary artery has no significant disease.  The right coronary artery has a 60% stenosis in the mid portion of the vessel.  Left ventricular function appears mild to moderately reduced with ejection fraction estimated at approximately 40% with moderate anterior apical hypokinesis.  IMPRESSION:  Severe two-vessel coronary artery disease with mild to moderate left ventricular dysfunction and class II progressive angina.  I believe that Mr. Stene would certainly benefit from elective coronary artery bypass graft.  PLAN:  I have outlined at length the indications, risks, and potential benefits of coronary artery bypass grafting with Mr. [place] and his wife. They understand and accept all associated risks of surgery including, but not limited to, risks of death, stroke, myocardial infarction, bleeding requiring blood transfusion, arrhythmia, infection, and recurrent coronary artery disease.  The associated risks and potential benefits of use of bilateral internal mammary artery harvest and grafting as conduit has been discussed. All questions have been addressed.  We plan surgery tomorrow morning, first case. Dictated by:   Salvatore Decent Cornelius Moras, M.D. Attending Physician:  Darlin Priestly DD:  07/17/01 TD:  07/17/01 Job: [id number]

## 2011-01-19 NOTE — Discharge Summary (Signed)
NAME:  Leonard Cisneros, Leonard Cisneros NO.:  [id number]   MEDICAL RECORD NO.:  [id number]          PATIENT TYPE:  INP   LOCATION:  0482                         [facility]ACILITY[facility]  [facility]   PHYSICIAN:  Ollen Gross, M.D.    DATE OF BIRTH:  [birth year]   DATE OF ADMISSION:  05/03/2004  DATE OF DISCHARGE:  05/06/2004                                 DISCHARGE SUMMARY   ADMISSION DIAGNOSES:  1.  Loose right total hip arthroplasty.  2.  Hypertension.  3.  Hypercholesterolemia.  4.  Coronary arterial disease.  5.  Status post coronary artery bypass grafting of the great vessels.  6.  Left ventricular systolic function, estimated at approximately 61% per      stress test.   DISCHARGE DIAGNOSES:  1.  Loose acetabular component, right hip, status post right acetabular      revision with bone grafting.  2.  Mild postoperative anemia.  3.  Mild hyponatremia.  4.  Hypertension.  5.  Hypercholesterolemia.  6.  Coronary arterial disease.  7.  Status post coronary artery bypass grafting of the great vessels.  8.  Left ventricular systolic function, estimated at approximately 61% per      stress test.   PROCEDURE:  On May 03, 2004, right acetabular revision with bone  grafting.   SURGEON:  Ollen Gross, M.D.   ASSISTANT:  Madlyn Frankel. Charlann Boxer, M.D.   ANESTHESIA:  General.   BLOOD LOSS:  About 400 cc.   DRAINS:  Hemovac x1.   CONSULTS:  None.   BRIEF HISTORY:  Mr. Carmer is a 70 year old male with a right total hip done  in [place] about 6-7 years ago.  He has had progressive groin pain.  Radiograph  shows massive osteolysis with complete loosening of the tops with the cup,  central defect of the acetabulum in connection with discontinuity of the  pelvis, now presents for acetabular and possible hip revision.   LABORATORY DATA:  CBC showed a hemoglobin of 15.1, hematocrit 43.5, white  cell count 5.5, differential within normal limits with the exception of  eosinophils elevated at 7.   Postop H&H 12.7 and 36.2.  Last noted H&H 11.3  and 32.8.  PT/PTT preop 12.1 and 26, respectively with an INR of 0.9.  Serial pro times followed.  Last noted PT/INR of 13.9 and 1.1.  Chem panel  on admission all within normal limits with the exception of an ALT, which  was elevated at 50.  Serial BMETs are followed.  Sodium dropped from 138 to  134.  Glucose 109 and 150.  Calcium dropped to 8.3.  Urinalysis negative.  Blood group type O+.   Right hip films:  Superomedial migration, protrusion and deformity, and  right bipolar total hip arthroplasty.  Findings consistent with loosening.  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   Two-view chest:  Chronic lung changes.  Prior CABG.  No active disease.   Pelvis film from May 03, 2004:  Satisfactory right total hip  arthroplasty.   HOSPITAL COURSE:  The patient admitted to [facility] , taken to  the OR, and underwent the  above-stated procedure without complications.  The  patient tolerated the procedure well and was later sent to the recovery room  and then to the orthopedic floor for continued postoperative care.  Vital  signs were followed.  Hemovac drain placed at the time of surgery.  By day  #1, patient had already ambulated 150 feet and was doing well with pain  control.  By day #2, did so well they felt like he would be going home very  quickly, and arrangements were made.  PCA and IV's were discontinued on day  #2.  Incision was healing well after the dressing change.  Did so well by  day #3 that the patient was ready to go home.   DISCHARGE PLAN:  1.  Patient was discharged home.  2.  Discharge diagnoses:  Please see above.  3.  Discharge meds:  Percocet, Robaxin, Coumadin.  4.  Follow up in 1-1/2 from discharge, which is about two weeks from the      date of surgery.  Contact the office for an appointment.   DISPOSITION:  Home.   CONDITION ON DISCHARGE:  Improved.     Alex   ALP/MEDQ  D:  06/14/2004   T:  06/14/2004  Job:  [id number]   cc:   Donna Bernard, M.D.  [address]  [place]  [place] [address]  Fax: [phone]   Darlin Priestly, MD  [phone] [address]  [place]  [place] [address]  Fax: [phone]

## 2011-01-19 NOTE — Cardiovascular Report (Signed)
[place]. [facility]  Patient:    Leonard Cisneros, Leonard Cisneros Visit Number: [id number] MRN: [id number]          Service Type: MED Location: [phone] Attending Physician:  Darlin Priestly Dictated by:   [name], M.D. Proc. Date: 07/17/01 Admit Date:  07/16/2001                          Cardiac Catheterization  PROCEDURES: 1. Left heart catheterization. 2. Coronary angiography. 3. Left ventriculogram.  ATTENDING:  [name], M.D.  COMPLICATIONS:  None.  INDICATIONS:  [name] is a 70 year old white male with a history of tobacco abuse, hypertension, a history of cardiac catheterization in 1999 with no significant CAD.  The patient recently developed chest pain and underwent treadmill Cardiolite on July 14, 2001, revealing anterior wall ischemia with recurrent atrial flutter.  He is now brought for definitive catheterization.  DESCRIPTION OF PROCEDURE:  After giving informed written consent, the patient was brought to the cardiac catheterization lab where the right and left groins were shaved, prepped, and draped in the usual sterile fashion.  ECG monitoring was established.  Using the modified Seldinger technique, a 6-French arterial sheath was inserted in the right femoral artery.  The 6-French diagnostic catheters were then used to perform diagnostic angiography.  FINDINGS: 1. This reveals a short left main with no significant disease. 2. The LAD is a medium-sized vessel which courses toward the mid ventricle and    gives rise to one diagonal branch.  The LAD is noted to have a 99% proximal    stenotic lesion.  The first diagonal is a medium-sized vessel with no    significant disease. 3. The left coronary artery also gives rise to a medium-sized ramus    intermedius which has a 99% ostial stenosis. 4. The left circumflex is a large vessel which coursed in the AV groove and    gave rise to two obtuse marginal branches.  There is  no significant disease    in the AV groove circumflex or OM-1 or 2. 5. The right coronary artery is a large vessel which is dominant and gives    rise to a PDA.  There is a 60% mid RCA stenotic lesion which appears hazy    with a small aneurysm noted.  There is TIMI-3 flow to the distal vessel.  Left ventriculogram reveals a mildly depressed EF of 35-40% with anterolateral, apical, and inferoapical hypokinesis.  HEMODYNAMICS:  Systemic arterial pressure 120/78, LV systemic pressure 118/12, LVEDP of 14.  CONCLUSIONS: 1. Significant two-vessel coronary artery disease. 2. Moderately depressed left ventricular systolic function. Dictated by:   [name], M.D. Attending Physician:  Darlin Priestly DD:  07/17/01 TD:  07/17/01 Job: [id number]

## 2011-01-19 NOTE — Discharge Summary (Signed)
NAME:  Leonard Cisneros, [place] NO.:  [id number]   MEDICAL RECORD NO.:  [id number]          PATIENT TYPE:  INP   LOCATION:  1437                         FACILITY:  [facility]   PHYSICIAN:  Ollen Gross, M.D.    DATE OF BIRTH:  April 10, 1941   DATE OF ADMISSION:  09/04/2006  DATE OF DISCHARGE:  09/07/2006                               DISCHARGE SUMMARY   ADMISSION DIAGNOSES:  1. Osteoarthritis, left hip.  2. Hypertension.  3. Hypercholesterolemia.  4. Coronary arterial disease.  5. History of transient hyperthyroidism secondary to medications but      resolved.  6. Paroxysmal atrial fibrillation.   DISCHARGE DIAGNOSES:  1. Osteoarthritis, left hip, status post left total hip arthroplasty.  2. Mild postoperative acute blood loss anemia.  Did not require      transfusion.  3. Postoperative hyponatremia, improved.  4. Chronic atrial fibrillation with rapid ventricular response,      improved.  5. Hypertension.  6. Hypercholesterolemia.  7. Coronary arterial disease.  8. History of transient hyperthyroidism secondary to medications but      resolved.  9. Paroxysmal atrial fibrillation.   PROCEDURE:  On September 04, 2006, left total hip arthroplasty.   SURGEON:  Ollen Gross, M.D.   ASSISTANT:  Alexzandrew L. Julien Girt, P.A.-C.   ANESTHESIA:  General.   CONSULTS:  [facility], cardiology consult.   BRIEF HISTORY:  Patient is a 70 year old male who has developed end-  stage arthritis of the left hip with intractable pain.  Now presents for  a total hip arthroplasty.   LABORATORY DATA:  Preop CBC showed a hemoglobin of 15.8, hematocrit 45,  white cell count 7.5.  He does have a hemoglobin of 12.6, drifted down  to 11.7.  Last noted H&H 10 and 28.  PT/PTT preop 14.5 and 28,  respectively.  INR 1.1.  Serial pro times followed:  Last noted PT/INR  are 17.8 and 1.4.  Chem panel on admission, all within normal limits  with the exception of an elevated AST  of 42, elevated ALP of 146, and  slightly elevated total bilirubin of 1.5.  Serial BMETs are followed.  Sodium dropped from 140 to 134, back up to 136.  Preop UA:  Small  leukocyte esterase, rare epithelials, 0-2 white cells, rare bacteria.  Blood group type O+.   EKG preop, atrial fibrillation, irregular atrial activity.  Pulse rate  98.  Confirmed by Dr. Jenne Campus.  This is dated February 04, 2006.  Follow-up  EKG on September 05, 2006, atrial fibrillation with rapid ventricular  response.  When compared, atrial fibrillation has replaced normal sinus  rhythm, confirmed by Dr. Dione Booze.   Chest x-ray on August 22, 2006, stable chest x-ray with no evidence of  acute cardiopulmonary process.   Left hip films on August 22, 2006, marked arthritis, left hip, with a  right total hip prosthesis with no gross hardware complication.  Postop  pelvis film, no complications about left total hip arthroplasty.   HOSPITAL COURSE:  Patient was admitted to [facility],  tolerated the procedure well, and was later transferred  to the recovery  room and then to the orthopedic floor.  Started on PCA and p.o.  analgesics for pain control following surgery.  Cardiology was consulted  to assist with cardiac management of the patient on postop.  Patient was  seen by [facility] for Dr. Jenne Campus.  Was noted to  be stable after surgery.  Coumadin was placed for DVT prophylaxis.  The  rhythm and rate were stable.  Did not need telemetry bed immediately  after surgery.  Had a fairly difficult night due to pain but was feeling  a little bit better.  On the morning of day #1, could already tell a  difference.  The deep pain was improved.  It was more surgical pain.  Hemovac drain placed at the time of surgery was pulled without  difficulty, and fluids were reduced.  The PCA was discontinued and  weaned over to p.o. meds.  Unfortunately, later that day, the patient  started having a rapid  ventricular rate.  EKG was done and had chronic A  fib with rapid ventricular response.  Cardiology was consulted.  Started  on IV Cardizem and switched to a telemetry bed.  Placed on Lovenox and  concurrent treatment with Coumadin.  The patient responded to the IV  Cardizem and Cardizem drip well.  That was on the afternoon of day #1.   By day #2, the patient was doing much better.  Rate was under better  control using the Cardizem drip.  Fluids were reduced and were able to  hep-lock the IV once the Cardizem was discontinued.  Continued on the  Lovenox until the patient was therapeutic.  The dressing was changed on  day #2.  The incision looked good.  Was down on the telemetry floor but  did get up and ambulate well.  Went about 140 feet and later 200 feet  that day, doing very well from the orthopedic standpoint.  He was  improving from the cardiology standpoint.  The rate was improved.  He  was switched over to p.o. meds after the Cardizem was discontinued.  Continued to progress well.   By the following day of September 07, 2006, he was cleared from a cardiac  standpoint.  His rate was stable.  He is doing well from an orthopedic  standpoint and was discharged home.   DISCHARGE PLAN:  1. Patient was discharged home on September 07, 2006.  2. Discharge diagnoses:  Please see above.  3. Discharge meds:  Percocet, Robaxin, Coumadin.  4. Diet:  Cardiac diet.  5. Activity:  Partial weightbearing, 25-50%, left lower extremity.      Home health PT, home health nursing, total hip protocol.  6. Followup:  Two weeks from the date of surgery.  Contact the office      for an appointment to follow up with Dr. Despina Hick.  Patient is also      instructed by cardiology to follow up with Dr. Jenne Campus in about 3-4      weeks.  Given Lovenox for a couple of additional days until INR      therapeutic.   DISPOSITION:  Home.   CONDITION ON DISCHARGE:  Improving.     Alexzandrew L. Julien Girt,  P.A.      Ollen Gross, M.D.  Electronically Signed    ALP/MEDQ  D:  10/15/2006  T:  10/15/2006  Job:  [id number]   cc:   Darlin Priestly, MD  Fax: [phone]

## 2011-01-19 NOTE — Discharge Summary (Signed)
[place]. [facility]  Patient:    Leonard Cisneros, Leonard Cisneros Visit Number: [id number] MRN: [id number]          Service Type: MED Location: 2000 2004 01 Attending Physician:  Tressie Stalker Dictated by:   Tollie Pizza. Collins, P.A.-C. Admit Date:  07/16/2001 Disc. Date: 07/22/01   CC:         Leonard Cisneros, M.D.  Donna Bernard, M.D.   Discharge Summary  PRIMARY ADMISSION DIAGNOSES: 1. Severe two vessel coronary artery disease. 2. Class IV progressive angina.  ADDITIONAL DIAGNOSES: 1. History of atrial fibrillation and atrial flutter. 2. Hypertension.  PROCEDURES PERFORMED: 1. Cardiac catheterization. 2. Coronary artery bypass grafting x 3 with left internal mammary artery to    the LAD, right internal mammary artery to the right coronary artery,    saphenous vein graft to ramus intermedius.  HISTORY OF PRESENT ILLNESS:  The patient is a 70 year old white male who has a known history of atrial fibrillation and atrial flutter which has been present since 1999.  He has been followed by Armanda Magic, M.D., in the past and failed multiple attempts at direct current cardioversion.  He was finally able to convert to normal sinus rhythm with Rythmol.  He had done well until October at which point he presented with increasing dyspnea on exertion as well as exertional chest pressure. A Cardiolite study was performed on July 14, 2001, which showed anterior wall ischemia with chest pain and recurrent atrial flutter.  He was admitted for further evaluation and treatment.  HOSPITAL COURSE:  He underwent cardiac catheterization on July 16, 2001, performed by Leonard Cisneros, M.D., which showed severe two-vessel coronary artery disease with mild to moderate left ventricular dysfunction.  These lesions were felt to be not amenable to percutaneous intervention, therefore, a cardiothoracic surgery consultation was obtained.  He was seen by Salvatore Decent. Cornelius Moras, M.D.,  and it was agreed that he should proceed with surgery.  He was taken to the operating room on July 18, 2001, where he underwent CABG x 3 with the above noted grafts.  He tolerated the procedure well and was transferred to the ICU in stable condition.  He was extubated shortly after surgery and was hemodynamically stable on postop day #1.  At that time he was felt to be ready for transfer to the step-down unit.  Since that time, he has done well postoperatively.  He has been ambulating in the halls without difficulty.  He has maintained normal sinus rhythm and has been restarted on home dose of Rythmol.  He is tolerating a regular diet and having normal bowel and bladder function.  His incisions are healing well.  He has remained afebrile and all vital signs have been stable.  He has been somewhat volume overloaded and has been started on Lasix and is diuresing nicely on his current dose.  It is felt if he remains stable and continues to do well, he will be ready for discharge home on November 70, 2002.  DISCHARGE MEDICATIONS: 1. Enteric coated aspirin 325 mg q.d. 2. Rythmol 300 mg q.8h. 3. Lopressor 25 mg b.i.d. 4. Lasix 40 mg q.d. x 1 week. 5. K-Dur 20 mEq q.d. x 1 week. 6. Tylox one to two q.4h. p.r.n. for pain.  DISCHARGE FOLLOW-UP:  He will see Dr. Jenne Campus in the office in two weeks.  He will then follow up in three weeks with Dr. Cornelius Moras in the [facility] office.  He was asked to have a chest x-ray  at [facility] one hour prior to his appointment with Dr. Cornelius Moras and should bring his chest x-ray with him to the [facility] office.  DISCHARGE INSTRUCTIONS:  He is asked to refrain from driving, lifting anything heavier than 10 pounds or any strenuous activity. He should shower daily and clean his incisions with soap and water.  If his incisions become reddened or develop purulent drainage or if he develops fever greater than 101, he is asked to call our office immediately. He is  also asked to continue a low fat, low sodium diet.  If any problems occur in the interim, he is asked to call our office immediately. Dictated by:   Tollie Pizza Collins, P.A.-C. Attending Physician:  Tressie Stalker DD:  07/21/01 TD:  07/21/01 Job: [id number]

## 2011-01-19 NOTE — Op Note (Signed)
[place]. [facility]  Patient:    Leonard Cisneros, Leonard Cisneros Visit Number: [id number] MRN: [id number]          Service Type: MED Location: 2300 2309 [id number] Attending Physician:  Tressie Stalker Dictated by:   Salvatore Decent. Cornelius Moras, M.D. Proc. Date: 07/18/01 Admit Date:  07/16/2001                             Operative Report  PREOPERATIVE DIAGNOSIS:  Severe three-vessel coronary artery disease with class II progressive angina.  POSTOPERATIVE DIAGNOSIS:  Severe three-vessel coronary artery disease with class II progressive angina.  PROCEDURE:  Median sternotomy for coronary artery bypass grafting x3 (left internal mammary artery to distal left anterior descending coronary artery, right internal mammary artery to distal right coronary artery, saphenous vein graft to ramus intermediate branch).  SURGEON:  Salvatore Decent. Cornelius Moras, M.D.  ASSISTANT:  Mr. Maple Mirza.  ANESTHESIA:  General.  BRIEF CLINICAL NOTE:  The patient is a 70 year old gentleman from [place], followed by Leonard Cisneros, M.D., and referred by Leonard Cisneros, M.D., for management of coronary artery disease.  The patient has a history of atrial fibrillation and atrial flutter, as well as hypertension.  He has no previous history of coronary artery disease.  He presents with a six-week history of progressive symptoms of angina.  He underwent exercise Cardiolite exam, which documented both symptoms and radionuclide evidence of acute ischemia. Elective cardiac catheter performed by Leonard Cisneros, M.D., documents the presence of severe two-vessel coronary artery disease, not amenable to percutaneous-based therapy.  There is mild to moderate left ventricular dysfunction with ejection fraction estimated at 35-40%.  A full consultation note has been dictated previously.  The patient and his wife have been counseled at length regarding the indications and the potential benefits of coronary artery bypass  grafting. They understand and accept all associated risks of surgery, including but not limited to risk of death, stroke, myocardial infarction, bleeding requiring blood transfusion, arrhythmia, infection, recurrent coronary artery disease. All of their questions have been addressed.  OPERATIVE NOTE IN DETAIL:  The patient is brought to the operating room on the above-mentioned date, and invasive hemodynamic monitoring is established by the anesthesia service under the care and direction of Burna Forts, M.D. Intravenous antibiotics are administered.  The patient is placed in the supine position on the operating table.  Following induction with general endotracheal anesthesia, the patients chest, abdomen, both groins, and both lower extremities are prepared and draped in a sterile manner.  A median sternotomy incision is performed, and the left internal mammary artery is dissected from the chest wall and prepared for bypass graft.  The left internal mammary artery is notably good quality conduit.  Subsequently, the right internal mammary artery is also dissected from the chest wall and prepared for bypass graft.  This vessel is also felt to be a good quality conduit.  Simultaneously, a segment of saphenous vein is obtained from the patients right lower leg through a longitudinal incision.  The saphenous vein is notably good quality conduit.  The patient is heparinized systemically.  The pericardium is opened.  The ascending aorta is normal in appearance, although slightly enlarged.  The ascending aorta and the right atrium are cannulated for cardiopulmonary bypass.  Adequate heparinization is verified. Cardiopulmonary bypass is begun, and the surface of heart is inspected.  The distal sites are selected for coronary bypass grafting. A portion of the saphenous vein and  both internal mammary arteries are prepared for bypass grafting and trimmed to appropriate length.  A temperature  probe is placed in the left ventricular septum, and a styrofoam pad is placed to protect the left phrenic nerve from thermal injury.  A cardioplegic catheter is placed in the ascending aorta.  The patient is cooled to 32 degrees systemic temperature.  The aortic cross clamp is applied, and cardioplegia is delivered in antegrade fashion through the aortic root.  Ice saline slush is applied for topical hypothermia.  The initial cardioplegic arrest is rapid and excellent, although myocardial cooling is a little bit slow and requires a large dose of cardioplegia to sustain septal temperature below 15 degrees centigrade.  Repeat doses of cardioplegia are administered intermittently throughout the cross-clamped portion of the operation, both through the aortic root and down the subsequently placed vein graft to maintain septal temperature below 15 degrees centigrade.  The following distal coronary anastomoses are performed:  1. The ramus intermediate branch is grafted with a saphenous vein graft in an end-to-side fashion.  This coronary measures 1.8 mm in diameter and is of good quality.  It is intramyocardial. 2. The distal right coronary artery is grafted with the right internal mammary artery in an end-to-side fashion.  This coronary measures 2.2 mm in diameter and is of good quality. 3. The distal left anterior descending coronary artery is grafted with a left internal mammary artery in an end-to-side fashion.  This coronary measures 2.0 mm in diameter and is of good quality.  The single proximal saphenous vein anastomosis is performed directly to the ascending aorta prior to removal of the aortic cross clamp.  The septal temperature is noted to rise rapidly and dramatically upon reperfusion of the internal mammary artery grafts.  The aortic cross clamp is removed after total cross clamped time is 70 minutes.  The heart begins to beat spontaneously, and normal sinus rhythm resumes.   The proximal and all distal anastomoses are inspected for hemostasis and appropriate graft orientation.  Epicardial pacing wires are fixed to the right  ventricular outflow tract into the right atrial appendage.  The patient is rewarmed to greater than 37 degrees centigrade temperature.  The patient is weaned from cardiopulmonary bypass without difficulty.  The patients rhythm ______ from bypass is normal sinus rhythm.  No inotropic support is required. Total cardiopulmonary bypass time of the operation is 88 minutes.  The venous and arterial cannulae are removed uneventfully. Protamine is administered to reverse anticoagulation. The mediastinum and both left and right pleural spaces are irrigated with saline solution containing ______. Meticulous surgical hemostasis is ascertained.  The mediastinum and both left and right pleural spaces are drained with four chest tubes placed through separate stab incisions inferiorly.  The median sternotomy is closed in routine fashion.  The right lower extremity incision is closed in multiple layers in routine fashion.  All skin incisions are closed with subcuticular skin closures.  The patient tolerated the procedure well and is transported to the surgical intensive care unit in stable condition. There are no intraoperative complications.  All sponge and instrument counts are verified as correct at the completion of the operation.  One needle count is incorrect, consisting of a very tiny needle from an 8-0 suture, which was lost off the back table. Radiograph obtained in the intensive care unit verifies the absence of any foreign bodies.  The patient was transported to the surgical intensive care unit in a stable condition.  No blood products were  administered. Dictated by:   Salvatore Decent Cornelius Moras, M.D. Attending Physician:  Tressie Stalker DD:  07/18/01 TD:  07/18/01 Job: [id number]

## 2011-01-25 ENCOUNTER — Ambulatory Visit (HOSPITAL_COMMUNITY)
Admission: RE | Admit: 2011-01-25 | Discharge: 2011-01-25 | Disposition: A | Discharge status: Home / Self Care | Payer: Medicare Other | Source: Ambulatory Visit | Attending: Family Medicine | Admitting: Family Medicine

## 2011-01-25 ENCOUNTER — Other Ambulatory Visit: Payer: Self-pay | Admitting: Family Medicine

## 2011-01-25 DIAGNOSIS — M25512 Pain in left shoulder: Secondary | ICD-10-CM

## 2011-01-25 DIAGNOSIS — W19XXXA Unspecified fall, initial encounter: Secondary | ICD-10-CM | POA: Insufficient documentation

## 2011-01-25 DIAGNOSIS — M25519 Pain in unspecified shoulder: Secondary | ICD-10-CM | POA: Insufficient documentation

## 2011-01-25 DIAGNOSIS — IMO0002 Reserved for concepts with insufficient information to code with codable children: Secondary | ICD-10-CM | POA: Insufficient documentation

## 2011-01-25 DIAGNOSIS — R52 Pain, unspecified: Secondary | ICD-10-CM

## 2011-01-25 DIAGNOSIS — R0781 Pleurodynia: Secondary | ICD-10-CM

## 2011-01-25 DIAGNOSIS — R0789 Other chest pain: Secondary | ICD-10-CM | POA: Insufficient documentation

## 2011-11-13 HISTORY — PX: US ECHOCARDIOGRAPHY: HXRAD669

## 2012-01-22 ENCOUNTER — Other Ambulatory Visit (HOSPITAL_COMMUNITY): Payer: Self-pay | Admitting: Orthopedic Surgery

## 2012-01-22 DIAGNOSIS — T84031A Mechanical loosening of internal left hip prosthetic joint, initial encounter: Secondary | ICD-10-CM

## 2012-01-24 ENCOUNTER — Other Ambulatory Visit (HOSPITAL_COMMUNITY): Payer: Medicare Other

## 2012-01-29 ENCOUNTER — Encounter (HOSPITAL_COMMUNITY): Payer: Self-pay

## 2012-01-29 ENCOUNTER — Encounter (HOSPITAL_COMMUNITY)
Admission: RE | Admit: 2012-01-29 | Discharge: 2012-01-29 | Disposition: A | Discharge status: Home / Self Care | Payer: Medicare Other | Source: Ambulatory Visit | Attending: Orthopedic Surgery | Admitting: Orthopedic Surgery

## 2012-01-29 DIAGNOSIS — T84031A Mechanical loosening of internal left hip prosthetic joint, initial encounter: Secondary | ICD-10-CM

## 2012-01-29 DIAGNOSIS — M25559 Pain in unspecified hip: Secondary | ICD-10-CM | POA: Insufficient documentation

## 2012-01-29 MED ORDER — TECHNETIUM TC 99M MEDRONATE IV KIT
25.0000 | PACK | Freq: Once | INTRAVENOUS | Status: AC | PRN
  Administered 2012-01-29: 24.7 via INTRAVENOUS

## 2012-06-12 ENCOUNTER — Telehealth: Payer: Self-pay

## 2012-06-12 NOTE — Telephone Encounter (Signed)
Pt received a letter that it is time to have his next colonoscopy. His last was in 06/2002 by [facility].  His next was recommended in 10 years. He is on coumadin and will need an OV prior to colonoscopy. He works part time at [employer] and will get his schedule tomorrow. He will either call Friday morning before noon or Mon and ask to speak with [facility] for an appt next week.

## 2012-06-17 ENCOUNTER — Encounter: Payer: Self-pay | Admitting: Internal Medicine

## 2012-06-17 NOTE — Telephone Encounter (Signed)
Pt has OV on 06/18/2012.

## 2012-06-18 ENCOUNTER — Ambulatory Visit (INDEPENDENT_AMBULATORY_CARE_PROVIDER_SITE_OTHER): Payer: Medicare Other | Admitting: Urgent Care

## 2012-06-18 ENCOUNTER — Other Ambulatory Visit: Payer: Self-pay | Admitting: Internal Medicine

## 2012-06-18 ENCOUNTER — Encounter: Payer: Self-pay | Admitting: Urgent Care

## 2012-06-18 VITALS — BP 125/79 | HR 88 | Temp 97.6°F | Ht 73.0 in | Wt 228.8 lb

## 2012-06-18 DIAGNOSIS — Z1211 Encounter for screening for malignant neoplasm of colon: Secondary | ICD-10-CM

## 2012-06-18 DIAGNOSIS — Z79899 Other long term (current) drug therapy: Secondary | ICD-10-CM

## 2012-06-18 DIAGNOSIS — Z0000 Encounter for general adult medical examination without abnormal findings: Secondary | ICD-10-CM | POA: Insufficient documentation

## 2012-06-18 MED ORDER — PEG 3350-KCL-NA BICARB-NACL 420 G PO SOLR: 4000.0000 mL | ORAL | Status: DC

## 2012-06-18 NOTE — Progress Notes (Signed)
Primary Care Physician:  Harlow Asa, MD Primary Gastroenterologist:  Dr. Jena Gauss  Chief Complaint  Patient presents with  . Colonoscopy    screening    HPI:  Leonard Cisneros is a 71 y.o. male here to set up screening colonoscopy.   He is doing very well.  He is on chronic coumadin for atrial fibrillation.  Denies any lower GI symptoms including constipation, diarrhea, rectal bleeding, melena or weight loss.  Last colonoscopy 10 years ago was normal.   Past Medical History  Diagnosis Date  . Atrial fibrillation     chronic coumadin    Past Surgical History  Procedure Date  . Colonoscopy 06/04/2002    Normal colon and rectum  . Hip surgery     x4  both hips  . Back surgery   . Rotator cuff repair   . Triple by-pass     Current Outpatient Prescriptions  Medication Sig Dispense Refill  . diltiazem (CARDIZEM CD) 240 MG 24 hr capsule Take 240 mg by mouth daily.       [name] ibuprofen (ADVIL,MOTRIN) 200 MG tablet Take 200 mg by mouth every 6 (six) hours as needed.      . metoprolol succinate (TOPROL-XL) 50 MG 24 hr tablet Take 50 mg by mouth daily.       . ramipril (ALTACE) 2.5 MG capsule Take 2.5 mg by mouth daily.       [name] warfarin (COUMADIN) 7.5 MG tablet Take 7.5 mg by mouth daily. Monday,Wednesday, Friday 7.5 mg Tuesday, Thursday, Saturday, and Sunday 10 mg        Allergies as of 06/18/2012  . (No Known Allergies)   Family History:  There is no known family history of colorectal carcinoma , liver disease, or inflammatory bowel disease.  History   Social History  . Marital Status: Married    Spouse Name: N/A    Number of Children: N/A  . Years of Education: N/A   Occupational History  . Not on file.   Social History Main Topics  . Smoking status: Former Smoker -- 0.5 packs/day    Types: Cigarettes  . Smokeless tobacco: Not on file   Comment: Quit about 40 years ago  . Alcohol Use: Yes     a glass of wine with dinner/ beer on the weekend  . Drug Use: No  . Sexually  Active: Not on file   Review of Systems: Gen: Denies any fever, chills, sweats, anorexia, fatigue, weakness, malaise, weight loss, and sleep disorder CV: Denies chest pain, angina, palpitations, syncope, orthopnea, PND, peripheral edema, and claudication. Resp: Denies dyspnea at rest, dyspnea with exercise, cough, sputum, wheezing, coughing up blood, and pleurisy. GI: Denies vomiting blood, jaundice, and fecal incontinence.   Denies dysphagia or odynophagia. GU : Denies urinary burning, blood in urine, urinary frequency, urinary hesitancy, nocturnal urination, and urinary incontinence. MS: Denies joint pain, limitation of movement, and swelling, stiffness, low back pain, extremity pain. Denies muscle weakness, cramps, atrophy.  Derm: Denies rash, itching, dry skin, hives, moles, warts, or unhealing ulcers.  Psych: Denies depression, anxiety, memory loss, suicidal ideation, hallucinations, paranoia, and confusion. Heme: Denies bruising, bleeding, and enlarged lymph nodes. Neuro:  Denies any headaches, dizziness, paresthesias. Endo:  Denies any problems with DM, thyroid, adrenal function.  Physical Exam: BP 125/79  Pulse 88  Temp 97.6 F (36.4 C) (Temporal)  Ht 6\' 1"  (1.854 m)  Wt 228 lb 12.8 oz (103.783 kg)  BMI 30.19 kg/m2 No LMP for male patient. General:  Alert,  Well-developed, well-nourished, pleasant and cooperative in NAD Head:  Normocephalic and atraumatic. Eyes:  Sclera clear, no icterus.   Conjunctiva pink. Ears:  Normal auditory acuity. Nose:  No deformity, discharge, or lesions. Mouth:  No deformity or lesions,oropharynx pink & moist. Neck:  Supple; no masses or thyromegaly. Lungs:  Clear throughout to auscultation.   No wheezes, crackles, or rhonchi. No acute distress. Heart:  HR irregularly regular. Abdomen:  Normal bowel sounds.  No bruits.  Soft, non-tender and non-distended without masses, hepatosplenomegaly or hernias noted.  No guarding or rebound tenderness.     Rectal:  Deferred. Msk:  Symmetrical without gross deformities. Normal posture. Pulses:  Normal pulses noted. Extremities:  No clubbing or edema. Neurologic:  Alert and  oriented x4;  grossly normal neurologically. Skin:  Intact without significant lesions or rashes. Lymph Nodes:  No significant cervical adenopathy. Psych:  Alert and cooperative. Normal mood and affect.

## 2012-06-18 NOTE — Assessment & Plan Note (Signed)
See screening colonoscopy

## 2012-06-18 NOTE — Patient Instructions (Addendum)
Colonoscopy with Dr. Rourk.  

## 2012-06-18 NOTE — Progress Notes (Signed)
Faxed to PCP

## 2012-06-18 NOTE — Assessment & Plan Note (Signed)
Leonard Cisneros is a pleasant 71 y.o. male due for average risk screening colonoscopy.  He is on chronic coumadin for atrial fibrillation.  We did discuss the fact that he is at a slightly higher risk of GI bleeding given the fact that he is on Coumadin, however the benefits of remaining on the Coumadin outweigh the risk of bleeding. We discussed the life threatening nature of an embolic event. He agrees to remain on Coumadin for this procedure. He also understands that a second procedure may be required since he is on Coumadin if he shows signs of significant bleeding or is in need of significant intervention that cannot be performed while on Coumadin. Other risks discussed include reaction to the medication, perforation, and infection. He agrees with all the above and consent will be obtained.

## 2012-06-26 ENCOUNTER — Other Ambulatory Visit: Payer: Self-pay | Admitting: Family Medicine

## 2012-06-26 DIAGNOSIS — G629 Polyneuropathy, unspecified: Secondary | ICD-10-CM

## 2012-06-26 DIAGNOSIS — I878 Other specified disorders of veins: Secondary | ICD-10-CM

## 2012-06-30 ENCOUNTER — Ambulatory Visit (HOSPITAL_COMMUNITY)
Admission: RE | Admit: 2012-06-30 | Discharge: 2012-06-30 | Disposition: A | Discharge status: Home / Self Care | Payer: Medicare Other | Source: Ambulatory Visit | Attending: Family Medicine | Admitting: Family Medicine

## 2012-06-30 DIAGNOSIS — I872 Venous insufficiency (chronic) (peripheral): Secondary | ICD-10-CM | POA: Insufficient documentation

## 2012-06-30 DIAGNOSIS — G629 Polyneuropathy, unspecified: Secondary | ICD-10-CM

## 2012-06-30 DIAGNOSIS — I878 Other specified disorders of veins: Secondary | ICD-10-CM

## 2012-06-30 DIAGNOSIS — G5790 Unspecified mononeuropathy of unspecified lower limb: Secondary | ICD-10-CM | POA: Insufficient documentation

## 2012-06-30 DIAGNOSIS — M79609 Pain in unspecified limb: Secondary | ICD-10-CM | POA: Insufficient documentation

## 2012-07-03 ENCOUNTER — Encounter (HOSPITAL_COMMUNITY): Payer: Self-pay | Admitting: Pharmacy Technician

## 2012-07-08 ENCOUNTER — Encounter (HOSPITAL_COMMUNITY): Payer: Self-pay | Admitting: *Deleted

## 2012-07-08 ENCOUNTER — Ambulatory Visit (HOSPITAL_COMMUNITY)
Admission: RE | Admit: 2012-07-08 | Discharge: 2012-07-08 | Disposition: A | Discharge status: Home / Self Care | Payer: Medicare Other | Source: Ambulatory Visit | Attending: Internal Medicine | Admitting: Internal Medicine

## 2012-07-08 ENCOUNTER — Encounter (HOSPITAL_COMMUNITY)
Admission: RE | Disposition: A | Discharge status: Home / Self Care | Payer: Self-pay | Source: Ambulatory Visit | Attending: Internal Medicine

## 2012-07-08 DIAGNOSIS — D126 Benign neoplasm of colon, unspecified: Secondary | ICD-10-CM

## 2012-07-08 DIAGNOSIS — Z1211 Encounter for screening for malignant neoplasm of colon: Secondary | ICD-10-CM

## 2012-07-08 DIAGNOSIS — Z7901 Long term (current) use of anticoagulants: Secondary | ICD-10-CM | POA: Insufficient documentation

## 2012-07-08 HISTORY — PX: COLONOSCOPY: SHX5424

## 2012-07-08 SURGERY — COLONOSCOPY
Anesthesia: Moderate Sedation

## 2012-07-08 MED ORDER — MEPERIDINE HCL 100 MG/ML IJ SOLN
INTRAMUSCULAR | Status: DC | PRN
  Administered 2012-07-08: 50 mg via INTRAVENOUS
  Administered 2012-07-08: 25 mg via INTRAVENOUS

## 2012-07-08 MED ORDER — STERILE WATER FOR IRRIGATION IR SOLN
Status: DC | PRN
  Administered 2012-07-08: 11:00:00

## 2012-07-08 MED ORDER — SODIUM CHLORIDE 0.45 % IV SOLN
INTRAVENOUS | Status: DC
  Administered 2012-07-08: 11:00:00 via INTRAVENOUS

## 2012-07-08 MED ORDER — MIDAZOLAM HCL 5 MG/5ML IJ SOLN
INTRAMUSCULAR | Status: AC
  Filled 2012-07-08: qty 10

## 2012-07-08 MED ORDER — MEPERIDINE HCL 100 MG/ML IJ SOLN
INTRAMUSCULAR | Status: AC
  Filled 2012-07-08: qty 1

## 2012-07-08 MED ORDER — MIDAZOLAM HCL 5 MG/5ML IJ SOLN
INTRAMUSCULAR | Status: DC | PRN
  Administered 2012-07-08: 1 mg via INTRAVENOUS
  Administered 2012-07-08: 2 mg via INTRAVENOUS

## 2012-07-08 NOTE — Interval H&P Note (Signed)
History and Physical Interval Note:  07/08/2012 11:20 AM  Leonard Cisneros  has presented today for surgery, with the diagnosis of Screening Colonoscopy  The various methods of treatment have been discussed with the patient and family. After consideration of risks, benefits and other options for treatment, the patient has consented to  Procedure(s) (LRB) with comments: COLONOSCOPY (N/A) - 11:30 as a surgical intervention .  The patient's history has been reviewed, patient examined, no change in status, stable for surgery.  I have reviewed the patient's chart and labs.  Questions were answered to the patient's satisfaction.     Leonard Cisneros  Patient here for a screening colonoscopy.  He remains on Coumadin per plan.The risks, benefits, limitations, alternatives and imponderables have been reviewed with the patient. Questions have been answered. All parties are agreeable.

## 2012-07-08 NOTE — Op Note (Signed)
[facility] [address] [place], [address]   COLONOSCOPY PROCEDURE REPORT  PATIENT: Leonard Cisneros, Leonard Cisneros  MR#:         [id number] BIRTHDATE: 08-01-1941 , 70  yrs. old GENDER: Male ENDOSCOPIST: R.  Roetta Sessions, MD FACP FACG REFERRED BY:  Simone Curia, M.D. PROCEDURE DATE:  07/08/2012 PROCEDURE:     Colonoscopy with biopsy  INDICATIONS:  Average risk colorectal cancer screening; Patient allowed to remain on Coumadin for the screening examination  INFORMED CONSENT:  The risks, benefits, alternatives and imponderables including but not limited to bleeding, perforation as well as the possibility of a missed lesion have been reviewed.  The potential for biopsy, lesion removal, etc. have also been discussed.  Questions have been answered.  All parties agreeable. Please see the history and physical in the medical record for more information.  MEDICATIONS: Versed 3 mg IV Demerol 75 mg IV in divided doses.  DESCRIPTION OF PROCEDURE:  After a digital rectal exam was performed, the Pentax Colonoscope [id number]  colonoscope was advanced from the anus through the rectum and colon to the area of the cecum, ileocecal valve and appendiceal orifice.  The cecum was deeply intubated.  These structures were well-seen and photographed for the record.  From the level of the cecum and ileocecal valve, the scope was slowly and cautiously withdrawn.  The mucosal surfaces were carefully surveyed utilizing scope tip deflection to facilitate fold flattening as needed.  The scope was pulled down into the rectum where a thorough examination including retroflexion was performed.    FINDINGS:  Adequate preparation. Normal rectum. The patient had a single diminutive 3 mm polyp in the mid ascending segment; otherwise, the remainder of the colonic mucosa appeared normal.  THERAPEUTIC / DIAGNOSTIC MANEUVERS PERFORMED:  The single above-mentioned polyp was gently pinched off with the  biopsy forceps. There was essentially nil bleeding with this maneuver.  COMPLICATIONS: None  CECAL WITHDRAWAL TIME:  10 minutes  IMPRESSION:  Single diminutive colon polyp - Removed as described above  RECOMMENDATIONS: Followup on pathology.   _______________________________ eSigned:  R. Roetta Sessions, MD FACP [facility] 07/08/2012 11:51 AM   CC:

## 2012-07-08 NOTE — H&P (View-Only) (Signed)
Primary Care Physician:  LUKING,W S, MD Primary Gastroenterologist:  Dr. Rourk  Chief Complaint  Patient presents with  . Colonoscopy    screening    HPI:  Leonard Cisneros is a 70 y.o. male here to set up screening colonoscopy.   He is doing very well.  He is on chronic coumadin for atrial fibrillation.  Denies any lower GI symptoms including constipation, diarrhea, rectal bleeding, melena or weight loss.  Last colonoscopy 10 years ago was normal.   Past Medical History  Diagnosis Date  . Atrial fibrillation     chronic coumadin    Past Surgical History  Procedure Date  . Colonoscopy 06/04/2002    Normal colon and rectum  . Hip surgery     x4  both hips  . Back surgery   . Rotator cuff repair   . Triple by-pass     Current Outpatient Prescriptions  Medication Sig Dispense Refill  . diltiazem (CARDIZEM CD) 240 MG 24 hr capsule Take 240 mg by mouth daily.       . ibuprofen (ADVIL,MOTRIN) 200 MG tablet Take 200 mg by mouth every 6 (six) hours as needed.      . metoprolol succinate (TOPROL-XL) 50 MG 24 hr tablet Take 50 mg by mouth daily.       . ramipril (ALTACE) 2.5 MG capsule Take 2.5 mg by mouth daily.       . warfarin (COUMADIN) 7.5 MG tablet Take 7.5 mg by mouth daily. Monday,Wednesday, Friday 7.5 mg Tuesday, Thursday, Saturday, and Sunday 10 mg        Allergies as of 06/18/2012  . (No Known Allergies)   Family History:  There is no known family history of colorectal carcinoma , liver disease, or inflammatory bowel disease.  History   Social History  . Marital Status: Married    Spouse Name: N/A    Number of Children: N/A  . Years of Education: N/A   Occupational History  . Not on file.   Social History Main Topics  . Smoking status: Former Smoker -- 0.5 packs/day    Types: Cigarettes  . Smokeless tobacco: Not on file   Comment: Quit about 40 years ago  . Alcohol Use: Yes     a glass of wine with dinner/ beer on the weekend  . Drug Use: No  . Sexually  Active: Not on file   Review of Systems: Gen: Denies any fever, chills, sweats, anorexia, fatigue, weakness, malaise, weight loss, and sleep disorder CV: Denies chest pain, angina, palpitations, syncope, orthopnea, PND, peripheral edema, and claudication. Resp: Denies dyspnea at rest, dyspnea with exercise, cough, sputum, wheezing, coughing up blood, and pleurisy. GI: Denies vomiting blood, jaundice, and fecal incontinence.   Denies dysphagia or odynophagia. GU : Denies urinary burning, blood in urine, urinary frequency, urinary hesitancy, nocturnal urination, and urinary incontinence. MS: Denies joint pain, limitation of movement, and swelling, stiffness, low back pain, extremity pain. Denies muscle weakness, cramps, atrophy.  Derm: Denies rash, itching, dry skin, hives, moles, warts, or unhealing ulcers.  Psych: Denies depression, anxiety, memory loss, suicidal ideation, hallucinations, paranoia, and confusion. Heme: Denies bruising, bleeding, and enlarged lymph nodes. Neuro:  Denies any headaches, dizziness, paresthesias. Endo:  Denies any problems with DM, thyroid, adrenal function.  Physical Exam: BP 125/79  Pulse 88  Temp 97.6 F (36.4 C) (Temporal)  Ht 6' 1" (1.854 m)  Wt 228 lb 12.8 oz (103.783 kg)  BMI 30.19 kg/m2 No LMP for male patient. General:     Alert,  Well-developed, well-nourished, pleasant and cooperative in NAD Head:  Normocephalic and atraumatic. Eyes:  Sclera clear, no icterus.   Conjunctiva pink. Ears:  Normal auditory acuity. Nose:  No deformity, discharge, or lesions. Mouth:  No deformity or lesions,oropharynx pink & moist. Neck:  Supple; no masses or thyromegaly. Lungs:  Clear throughout to auscultation.   No wheezes, crackles, or rhonchi. No acute distress. Heart:  HR irregularly regular. Abdomen:  Normal bowel sounds.  No bruits.  Soft, non-tender and non-distended without masses, hepatosplenomegaly or hernias noted.  No guarding or rebound tenderness.     Rectal:  Deferred. Msk:  Symmetrical without gross deformities. Normal posture. Pulses:  Normal pulses noted. Extremities:  No clubbing or edema. Neurologic:  Alert and  oriented x4;  grossly normal neurologically. Skin:  Intact without significant lesions or rashes. Lymph Nodes:  No significant cervical adenopathy. Psych:  Alert and cooperative. Normal mood and affect.   

## 2012-07-10 ENCOUNTER — Encounter: Payer: Self-pay | Admitting: *Deleted

## 2012-07-10 ENCOUNTER — Encounter: Payer: Self-pay | Admitting: Internal Medicine

## 2012-07-10 ENCOUNTER — Encounter (HOSPITAL_COMMUNITY): Payer: Self-pay | Admitting: Internal Medicine

## 2012-10-29 ENCOUNTER — Other Ambulatory Visit (HOSPITAL_COMMUNITY): Payer: Self-pay | Admitting: Cardiovascular Disease

## 2012-10-29 DIAGNOSIS — I2510 Atherosclerotic heart disease of native coronary artery without angina pectoris: Secondary | ICD-10-CM

## 2012-10-29 DIAGNOSIS — I119 Hypertensive heart disease without heart failure: Secondary | ICD-10-CM

## 2012-10-29 DIAGNOSIS — I4891 Unspecified atrial fibrillation: Secondary | ICD-10-CM

## 2012-11-18 ENCOUNTER — Encounter (HOSPITAL_COMMUNITY): Payer: Medicare Other

## 2012-11-21 ENCOUNTER — Ambulatory Visit (HOSPITAL_COMMUNITY)
Admission: RE | Admit: 2012-11-21 | Discharge: 2012-11-21 | Disposition: A | Discharge status: Home / Self Care | Payer: Medicare Other | Source: Ambulatory Visit | Attending: Cardiovascular Disease | Admitting: Cardiovascular Disease

## 2012-11-21 DIAGNOSIS — I2510 Atherosclerotic heart disease of native coronary artery without angina pectoris: Secondary | ICD-10-CM

## 2012-11-21 DIAGNOSIS — I4891 Unspecified atrial fibrillation: Secondary | ICD-10-CM | POA: Insufficient documentation

## 2012-11-21 DIAGNOSIS — I119 Hypertensive heart disease without heart failure: Secondary | ICD-10-CM

## 2012-11-21 MED ORDER — TECHNETIUM TC 99M SESTAMIBI GENERIC - CARDIOLITE
10.5000 | Freq: Once | INTRAVENOUS | Status: AC | PRN
  Administered 2012-11-21: 11 via INTRAVENOUS

## 2012-11-21 MED ORDER — TECHNETIUM TC 99M SESTAMIBI GENERIC - CARDIOLITE
30.9000 | Freq: Once | INTRAVENOUS | Status: AC | PRN
  Administered 2012-11-21: 30.9 via INTRAVENOUS

## 2012-11-21 NOTE — Procedures (Addendum)
[place] [facility] [address] [place] [address] [place] [address] [phone]  Cardiology Nuclear Med Study  Leonard Cisneros is a 72 y.o. male     MRN : [id number]     DOB: 02-11-1941  Procedure Date: 11/21/2012  Nuclear Med Background Indication for Stress Test:  Graft Patency and Abnormal EKG History:  CAD;CABG X3-07/18/2001;PTCA 07/17/2001 Cardiac Risk Factors: Family History - CAD, Hypertension, Lipids and Overweight  Symptoms:  PT DENIES BEING SYMPTOMATIC    Nuclear Pre-Procedure Caffeine/Decaff Intake:  7:00pm NPO After: 5:00am   IV Site: R Hand  IV 0.9% NS with Angio Cath:  22g  Chest Size (in):  48" IV Started by: Emmit Pomfret, RN  Height: 6\' 1"  (1.854 m)  Cup Size: n/a  BMI:  Body mass index is 27.45 kg/(m^2). Weight:  208 lb (94.348 kg)   Tech Comments:  N/A    Nuclear Med Study 1 or 2 day study: 1 day  Stress Test Type:  Stress  Order Authorizing Provider:  Nicki Guadalajara, MD   Resting Radionuclide: Technetium 55m Sestamibi  Resting Radionuclide Dose: 10.5 mCi   Stress Radionuclide:  Technetium 35m Sestamibi  Stress Radionuclide Dose: 30.9 mCi           Stress Protocol Rest HR: 116 Stress HR: 176  Rest BP: 146/88 Stress BP: 177/97  Exercise Time (min): 5:20 METS: 7.0   Predicted Max HR: 149 bpm % Max HR: 118.12 bpm Rate Pressure Product: 21308  Dose of Adenosine (mg):  n/a Dose of Lexiscan: n/a mg  Dose of Atropine (mg): n/a Dose of Dobutamine: n/a mcg/kg/min (at max HR)  Stress Test Technologist: Esperanza Sheets, CCT Nuclear Technologist: Gonzella Lex, CNMT   Rest Procedure:  Myocardial perfusion imaging was performed at rest 45 minutes following the intravenous administration of Technetium 73m Sestamibi. Stress Procedure:  The patient performed treadmill exercise using a Bruce  Protocol for 5:20 minutes. The patient stopped due to SOB and bilateral hip discomfort and denied any chest pain.  There were no  significant ST-T wave changes.  Technetium 65m Sestamibi was injected at peak exercise and myocardial perfusion imaging was performed after a brief delay.  Transient Ischemic Dilatation (Normal <1.22):  1.18 Lung/Heart Ratio (Normal <0.45):  0.34 QGS EDV:n/a   ml QGS ESV:  n/a ml LV Ejection Fraction: Study not gated   Rest ECG: Atrial Fibrilliation  Stress ECG: A-fib with RVR, upsloping mild ST depression  QPS Raw Data Images:  Normal; no motion artifact; normal heart/lung ratio. Stress Images:  Normal homogeneous uptake in all areas of the myocardium. Rest Images:  Normal homogeneous uptake in all areas of the myocardium. Subtraction (SDS):  No evidence of ischemia.  Impression Exercise Capacity:  Poor exercise capacity. BP Response:  Hypertensive blood pressure response. Clinical Symptoms:  Shortness of breath, hip pain, rest tachycardia ECG Impression:  A-fib with RVR, no ischemia Comparison with Prior Nuclear Study: No significant change from previous study in 2012 which was negative for ischemia.  Overall Impression:  Low risk stress nuclear study.  THR was achieved, but only because the patient has a-fib and RVR. Rest HR was 157 and target HR was 126. He only reached Stage 2 of the Bruce protocol. Caution is advised in interpreting images for prognostic purposes due to poor exercise capacity, however, there is no reversible ischemia.  Lexiscan imaging is advised in future studies.  LV Wall Motion:  Not gated due to atrial fibrillation and rapid ventricular response.  Chrystie Nose, MD, [facility] Board Certified in Nuclear Cardiology Attending Cardiologist [facility]  Chrystie Nose, MD  11/21/2012 1:29 PM

## 2013-01-14 ENCOUNTER — Encounter: Payer: Self-pay | Admitting: *Deleted

## 2013-01-28 ENCOUNTER — Other Ambulatory Visit: Payer: Self-pay | Admitting: *Deleted

## 2013-01-28 MED ORDER — METOPROLOL SUCCINATE ER 50 MG PO TB24: 50.0000 mg | ORAL_TABLET | Freq: Every day | ORAL | Status: DC

## 2013-01-28 NOTE — Telephone Encounter (Signed)
Metoprolol ER refilled taking Qd w/5 refills

## 2013-02-27 ENCOUNTER — Other Ambulatory Visit: Payer: Self-pay

## 2013-02-27 MED ORDER — RAMIPRIL 2.5 MG PO CAPS: 2.5000 mg | ORAL_CAPSULE | Freq: Every day | ORAL | Status: DC

## 2013-02-27 NOTE — Telephone Encounter (Signed)
Rx was sent to pharmacy electronically. 

## 2013-03-04 ENCOUNTER — Other Ambulatory Visit: Payer: Self-pay

## 2013-03-04 MED ORDER — DILTIAZEM HCL ER COATED BEADS 240 MG PO CP24: 240.0000 mg | ORAL_CAPSULE | Freq: Every day | ORAL | Status: DC

## 2013-03-04 NOTE — Telephone Encounter (Signed)
Rx was sent to pharmacy electronically. 

## 2013-03-11 ENCOUNTER — Other Ambulatory Visit: Payer: Self-pay | Admitting: Cardiovascular Disease

## 2013-03-12 LAB — PROTIME-INR
INR: 3.47 — ABNORMAL HIGH (ref <1.50)
Prothrombin Time: 33.2 seconds — ABNORMAL HIGH (ref 11.6–15.2)

## 2013-04-27 ENCOUNTER — Other Ambulatory Visit: Payer: Self-pay | Admitting: Pharmacist Clinician (PhC)/ Clinical Pharmacy Specialist

## 2013-04-27 MED ORDER — WARFARIN SODIUM 7.5 MG PO TABS: ORAL_TABLET | ORAL | Status: DC

## 2013-05-13 ENCOUNTER — Other Ambulatory Visit: Payer: Self-pay | Admitting: Cardiovascular Disease

## 2013-05-14 LAB — PROTIME-INR
INR: 3.17 — ABNORMAL HIGH (ref <1.50)
Prothrombin Time: 31 seconds — ABNORMAL HIGH (ref 11.6–15.2)

## 2013-06-15 ENCOUNTER — Other Ambulatory Visit: Payer: Self-pay | Admitting: Cardiovascular Disease

## 2013-06-16 LAB — PROTIME-INR: Prothrombin Time: 37.8 seconds — ABNORMAL HIGH (ref 11.6–15.2)

## 2013-06-17 ENCOUNTER — Other Ambulatory Visit: Payer: Self-pay | Admitting: Pharmacist Clinician (PhC)/ Clinical Pharmacy Specialist

## 2013-06-18 ENCOUNTER — Ambulatory Visit (INDEPENDENT_AMBULATORY_CARE_PROVIDER_SITE_OTHER): Payer: Medicare Other | Admitting: Pharmacist Clinician (PhC)/ Clinical Pharmacy Specialist

## 2013-06-18 ENCOUNTER — Telehealth: Payer: Self-pay | Admitting: Pharmacist Clinician (PhC)/ Clinical Pharmacy Specialist

## 2013-06-18 DIAGNOSIS — I482 Chronic atrial fibrillation, unspecified: Secondary | ICD-10-CM | POA: Insufficient documentation

## 2013-06-18 DIAGNOSIS — Z7901 Long term (current) use of anticoagulants: Secondary | ICD-10-CM

## 2013-06-18 DIAGNOSIS — I4891 Unspecified atrial fibrillation: Secondary | ICD-10-CM

## 2013-06-18 NOTE — Telephone Encounter (Signed)
Patient returned your call.

## 2013-06-18 NOTE — Telephone Encounter (Signed)
See anticoag appt

## 2013-07-03 NOTE — Progress Notes (Signed)
Forwarded result to kristen per Dr. Tresa Endo.

## 2013-07-04 LAB — HM COLONOSCOPY: HM COLON: NORMAL

## 2013-07-21 ENCOUNTER — Other Ambulatory Visit: Payer: Self-pay | Admitting: Cardiovascular Disease

## 2013-07-21 LAB — PROTIME-INR: INR: 2.05 — ABNORMAL HIGH (ref <1.50)

## 2013-07-29 ENCOUNTER — Other Ambulatory Visit: Payer: Self-pay | Admitting: *Deleted

## 2013-07-29 MED ORDER — METOPROLOL SUCCINATE ER 50 MG PO TB24: 50.0000 mg | ORAL_TABLET | Freq: Every day | ORAL | Status: DC

## 2013-07-29 NOTE — Telephone Encounter (Signed)
Rx was sent to pharmacy electronically. 

## 2013-08-04 ENCOUNTER — Ambulatory Visit (INDEPENDENT_AMBULATORY_CARE_PROVIDER_SITE_OTHER): Payer: Medicare Other | Admitting: Pharmacist Clinician (PhC)/ Clinical Pharmacy Specialist

## 2013-08-04 ENCOUNTER — Telehealth: Payer: Self-pay | Admitting: Cardiovascular Disease

## 2013-08-04 DIAGNOSIS — I4891 Unspecified atrial fibrillation: Secondary | ICD-10-CM

## 2013-08-04 DIAGNOSIS — Z7901 Long term (current) use of anticoagulants: Secondary | ICD-10-CM

## 2013-08-04 NOTE — Telephone Encounter (Signed)
Pt likely referring to PT/INR results on 11.18.14.  Message forwarded to K. Alvstad, PharmD.

## 2013-08-04 NOTE — Telephone Encounter (Signed)
Wants PT results from 2 weeks ago.Had this is at [facility] at [employer].

## 2013-09-07 ENCOUNTER — Other Ambulatory Visit: Payer: Self-pay | Admitting: *Deleted

## 2013-09-07 MED ORDER — DILTIAZEM HCL ER COATED BEADS 240 MG PO CP24: 240.0000 mg | ORAL_CAPSULE | Freq: Every day | ORAL | Status: DC

## 2013-09-09 ENCOUNTER — Ambulatory Visit (HOSPITAL_COMMUNITY)
Admission: RE | Admit: 2013-09-09 | Discharge: 2013-09-09 | Disposition: A | Discharge status: Home / Self Care | Payer: Medicare Other | Source: Ambulatory Visit | Attending: Family Medicine | Admitting: Family Medicine

## 2013-09-09 ENCOUNTER — Encounter: Payer: Self-pay | Admitting: Family Medicine

## 2013-09-09 ENCOUNTER — Other Ambulatory Visit: Payer: Self-pay | Admitting: Cardiovascular Disease

## 2013-09-09 ENCOUNTER — Ambulatory Visit (INDEPENDENT_AMBULATORY_CARE_PROVIDER_SITE_OTHER): Payer: Medicare Other | Admitting: Family Medicine

## 2013-09-09 VITALS — BP 120/88 | Ht 73.0 in | Wt 226.0 lb

## 2013-09-09 DIAGNOSIS — M25519 Pain in unspecified shoulder: Secondary | ICD-10-CM

## 2013-09-09 DIAGNOSIS — M25512 Pain in left shoulder: Secondary | ICD-10-CM

## 2013-09-09 NOTE — Progress Notes (Signed)
   Subjective:    Patient ID: Leonard Cisneros, male    DOB: 05-Oct-1940, 73 y.o.   MRN: [id number]  Coronary Artery Disease Presents for follow-up visit. The symptoms have been stable. Compliance with diet is good. Compliance with medications is good.  Arm Pain  The incident occurred more than 1 week ago. The injury mechanism was a fall. The pain is present in the upper left arm. The quality of the pain is described as aching. The pain does not radiate. The pain is moderate. The pain has been intermittent since the incident. Nothing aggravates the symptoms. He has tried nothing for the symptoms. The treatment provided no relief.   Shoulder pain, back in august took a tumble out of a chair, then fell, got better  achey distal mid arm, worse in the morning  pAin with rot of left shoulder  No noct awakening  Sleeps ok with it,  Sleeps on left sie   Review of Systems No neck pain no back pain no loss and hand strength. No numbness and hand.    Objective:   Physical Exam  Alert no apparent distress. Lungs clear. Heart regular in rhythm. H&T normal. Left shoulder positive impingement sign.      Assessment & Plan:  Impression probable rotator cuff injury discussed plan x-ray of all shoulder. No anti-inflammatories because of Coumadin. No pain meds at patient request. Or so consult rationale discussed. WSL

## 2013-09-10 LAB — PROTIME-INR
INR: 2.64 — AB (ref <1.50)
PROTHROMBIN TIME: 27.5 s — AB (ref 11.6–15.2)

## 2013-09-10 NOTE — Progress Notes (Signed)
Patient notified and verbalized understanding of results 

## 2013-09-22 ENCOUNTER — Ambulatory Visit (INDEPENDENT_AMBULATORY_CARE_PROVIDER_SITE_OTHER): Payer: Medicare Other | Admitting: Pharmacist Clinician (PhC)/ Clinical Pharmacy Specialist

## 2013-09-22 ENCOUNTER — Telehealth: Payer: Self-pay | Admitting: *Deleted

## 2013-09-22 DIAGNOSIS — Z7901 Long term (current) use of anticoagulants: Secondary | ICD-10-CM

## 2013-09-22 DIAGNOSIS — I4891 Unspecified atrial fibrillation: Secondary | ICD-10-CM

## 2013-09-22 NOTE — Telephone Encounter (Signed)
Message forwarded to K. Alvstad, PharmD.  

## 2013-09-22 NOTE — Telephone Encounter (Signed)
Pt stated that he had PT/INR drawn on January 7th and he would like for someone to call him back with the results. He stated that this happens often where he does not get a call back.   TK

## 2013-09-22 NOTE — Telephone Encounter (Signed)
See anticoag note

## 2013-09-30 ENCOUNTER — Other Ambulatory Visit: Payer: Self-pay | Admitting: *Deleted

## 2013-09-30 ENCOUNTER — Other Ambulatory Visit: Payer: Self-pay | Admitting: Pharmacist Clinician (PhC)/ Clinical Pharmacy Specialist

## 2013-09-30 MED ORDER — RAMIPRIL 2.5 MG PO CAPS: 2.5000 mg | ORAL_CAPSULE | Freq: Every day | ORAL | Status: DC

## 2013-10-06 ENCOUNTER — Other Ambulatory Visit: Payer: Self-pay | Admitting: *Deleted

## 2013-10-22 ENCOUNTER — Ambulatory Visit (INDEPENDENT_AMBULATORY_CARE_PROVIDER_SITE_OTHER): Payer: Medicare Other | Admitting: Orthopedic Surgery

## 2013-10-22 ENCOUNTER — Encounter: Payer: Self-pay | Admitting: Orthopedic Surgery

## 2013-10-22 VITALS — BP 122/69 | Ht 73.0 in | Wt 232.0 lb

## 2013-10-22 DIAGNOSIS — S43429A Sprain of unspecified rotator cuff capsule, initial encounter: Secondary | ICD-10-CM

## 2013-10-22 DIAGNOSIS — M75102 Unspecified rotator cuff tear or rupture of left shoulder, not specified as traumatic: Secondary | ICD-10-CM | POA: Insufficient documentation

## 2013-10-22 DIAGNOSIS — M719 Bursopathy, unspecified: Secondary | ICD-10-CM

## 2013-10-22 DIAGNOSIS — M67919 Unspecified disorder of synovium and tendon, unspecified shoulder: Secondary | ICD-10-CM

## 2013-10-22 NOTE — Progress Notes (Signed)
Patient ID: Leonard Cisneros, male   DOB: 09/23/1940, 72 y.o.   MRN: [id number]  Chief Complaint  Patient presents with  . Shoulder Pain    Left shoulder pain, Had a fall August 2014. Referred by Dr Mickie Hillier    History. 73 year old male status post right rotator cuff repair several years ago fell onto his left shoulder last year. Initially did well but over the last few months has noted increased pain and pain when he is performing exercises such as biceps curls with pain starting in the anterior shoulder radiating into the posterior aspect of the upper arm. He reports he can do things overhead straight overhead but has difficulty with her light.  Past family social history and review of systems have been recorded Review of Systems  Musculoskeletal: Positive for joint pain.  Neurological: Positive for tingling.       Gait unsteady   Endo/Heme/Allergies: Positive for environmental allergies. Bruises/bleeds easily.     His exam shows the following. Vital signs are stable. General appearance is normal, the patient is alert and oriented x3 with normal mood and affect. Ambulation shows minimal limp and abnormality secondary to previous surgeries but overall acceptable ambulation without assistive device  The left shoulder shows tenderness over the anterior joint line and biceps tendon this is exacerbated by the impingement maneuver and there is weakness in the supraspinatus tendon is passive range of motion is normal his active range of motion shows 150 of forward elevation and 45 of external rotation. The shoulder is stable in abduction external rotation and the skin is clean dry and intact pulses are intact in the left upper extremity sensation is normal  He has normal contour the biceps muscle bilaterally with resisted elbow flexion  X-rays show no fracture  Impression rotator cuff syndrome versus rotator cuff tear  Recommend subacromial injection and home exercise program followup 6  weeks  If no improvement recommend MRI of the shoulder  Procedure inject subacromial space left shoulder Diagnosis rotator cuff syndrome left shoulder Medication Depo-Medrol 40 mg, 1 cc and lidocaine 1% 3 cc Verbal consent Timeout completed  The injection site was cleaned with alcohol and sprayed with ethyl chloride. From a posterior approach a 20-gauge needle was injected in the subacromial space. The medication went in easily. There were no complications. The wound was covered with a sterile bandage. Appropriate precautions were given.

## 2013-10-22 NOTE — Patient Instructions (Addendum)
You have received a steroid shot. 15% of patients experience increased pain at the injection site with in the next 24 hours. This is best treated with ice and tylenol extra strength 2 tabs every 8 hours. If you are still having pain please call the office.   Diagnosis: Rotator cuff syndrome   Home exercises

## 2013-10-26 ENCOUNTER — Other Ambulatory Visit: Payer: Self-pay | Admitting: Cardiovascular Disease

## 2013-10-27 LAB — PROTIME-INR
INR: 2.92 — ABNORMAL HIGH (ref <1.50)
Prothrombin Time: 29.7 seconds — ABNORMAL HIGH (ref 11.6–15.2)

## 2013-11-05 ENCOUNTER — Ambulatory Visit (INDEPENDENT_AMBULATORY_CARE_PROVIDER_SITE_OTHER): Payer: Medicare Other | Admitting: Pharmacist Clinician (PhC)/ Clinical Pharmacy Specialist

## 2013-11-05 ENCOUNTER — Telehealth: Payer: Self-pay | Admitting: Cardiovascular Disease

## 2013-11-05 DIAGNOSIS — I4891 Unspecified atrial fibrillation: Secondary | ICD-10-CM

## 2013-11-05 DIAGNOSIS — Z7901 Long term (current) use of anticoagulants: Secondary | ICD-10-CM

## 2013-11-05 NOTE — Telephone Encounter (Signed)
Message forwarded to Tommy Medal, PharmD.

## 2013-11-05 NOTE — Telephone Encounter (Signed)
Pt had his PT on 10-26-13 at [facility] never received his results. He says this happen all the time since he started going to [facility] in [place].

## 2013-12-15 ENCOUNTER — Encounter: Payer: Self-pay | Admitting: Orthopedic Surgery

## 2013-12-15 ENCOUNTER — Ambulatory Visit (INDEPENDENT_AMBULATORY_CARE_PROVIDER_SITE_OTHER): Payer: Medicare Other | Admitting: Orthopedic Surgery

## 2013-12-15 VITALS — BP 125/54 | Ht 73.0 in | Wt 232.0 lb

## 2013-12-15 DIAGNOSIS — M719 Bursopathy, unspecified: Secondary | ICD-10-CM

## 2013-12-15 DIAGNOSIS — M75102 Unspecified rotator cuff tear or rupture of left shoulder, not specified as traumatic: Secondary | ICD-10-CM

## 2013-12-15 DIAGNOSIS — M67919 Unspecified disorder of synovium and tendon, unspecified shoulder: Secondary | ICD-10-CM

## 2013-12-15 NOTE — Progress Notes (Signed)
Patient ID: Leonard Cisneros, male   DOB: December 04, 1940, 73 y.o.   MRN: [id number]  Chief Complaint  Patient presents with  . Follow-up    recheck left shoulder s/p i njection and exercises    73 year old male fell injured left shoulder now doing well without pain has regained his normal range of motion is good strength in his rotator cuff   BP 125/54  Ht 6\' 1"  (1.854 m)  Wt 232 lb (105.235 kg)  BMI 30.62 kg/m2 General appearance is normal, the patient is alert and oriented x3 with normal mood and affect.  He will continue exercising at the gym followup with [place] as needed

## 2013-12-15 NOTE — Patient Instructions (Signed)
As tolerated

## 2013-12-29 ENCOUNTER — Other Ambulatory Visit: Payer: Self-pay | Admitting: Internal Medicine

## 2013-12-29 LAB — PT WITH INR/FINGERSTICK
INR, fingerstick: 3.1 — ABNORMAL HIGH (ref 0.80–1.20)
PT, fingerstick: 36.9 seconds — ABNORMAL HIGH (ref 10.4–12.5)

## 2013-12-29 LAB — PROTIME-INR: INR: 3.1 — AB (ref <=1.1)

## 2013-12-31 ENCOUNTER — Encounter: Payer: Self-pay | Admitting: *Deleted

## 2013-12-31 ENCOUNTER — Ambulatory Visit (INDEPENDENT_AMBULATORY_CARE_PROVIDER_SITE_OTHER): Payer: Medicare Other | Admitting: Pharmacist Clinician (PhC)/ Clinical Pharmacy Specialist

## 2013-12-31 DIAGNOSIS — I4891 Unspecified atrial fibrillation: Secondary | ICD-10-CM

## 2013-12-31 DIAGNOSIS — Z7901 Long term (current) use of anticoagulants: Secondary | ICD-10-CM

## 2014-01-04 ENCOUNTER — Ambulatory Visit (INDEPENDENT_AMBULATORY_CARE_PROVIDER_SITE_OTHER): Payer: Medicare Other | Admitting: Cardiovascular Disease

## 2014-01-04 ENCOUNTER — Encounter: Payer: Self-pay | Admitting: Cardiovascular Disease

## 2014-01-04 VITALS — BP 118/78 | HR 67 | Ht 73.0 in | Wt 226.6 lb

## 2014-01-04 DIAGNOSIS — R5383 Other fatigue: Secondary | ICD-10-CM

## 2014-01-04 DIAGNOSIS — Z7901 Long term (current) use of anticoagulants: Secondary | ICD-10-CM

## 2014-01-04 DIAGNOSIS — R5381 Other malaise: Secondary | ICD-10-CM

## 2014-01-04 DIAGNOSIS — Z951 Presence of aortocoronary bypass graft: Secondary | ICD-10-CM | POA: Insufficient documentation

## 2014-01-04 DIAGNOSIS — I4891 Unspecified atrial fibrillation: Secondary | ICD-10-CM

## 2014-01-04 DIAGNOSIS — I2510 Atherosclerotic heart disease of native coronary artery without angina pectoris: Secondary | ICD-10-CM

## 2014-01-04 DIAGNOSIS — Z79899 Other long term (current) drug therapy: Secondary | ICD-10-CM

## 2014-01-04 DIAGNOSIS — E782 Mixed hyperlipidemia: Secondary | ICD-10-CM

## 2014-01-04 NOTE — Patient Instructions (Signed)
Your physician recommends that you schedule a follow-up appointment in: 1 year  Your physician recommends that you return for lab work

## 2014-01-04 NOTE — Progress Notes (Signed)
Patient ID: Leonard Cisneros, male   DOB: 02-Jun-1941, 73 y.o.   MRN: [id number]     HPI: Leonard Cisneros is a 73 y.o. male who presents to the office today for a 1 year follow up cardiology evaluation.  [name] has established coronary artery disease in 2003 underwent CABG surgery by Dr. Roxy Manns with LIMA to the LAD, RIMA to the distal RCA, and vein graft to his ramus intermediate vessel.  Initially, he did have postoperative atrial fibrillation, which was controlled with Rythmol.  He is status post atrial flutter ablation by Dr. [name].  Over the last several years.  He has been in permanent atrial fibrillation and has been on warfarin anticoagulation.  His last nuclear perfusion study in March 2004 was essentially unchanged from February 2012 and did not reveal significant scar or ischemia.  He does have a history of her left hip surgery with revision.  Over the past year, he has been maintained on diltiazem CD 240 mg and metoprolol extended release 50 mg for rate control.  He also is on low-dose ACE inhibitor with [place] 2.5 mg daily.  He exercises regularly, typically 6 days per week.  He walks.  He is unaware of any rapid heartbeat.  He denies presyncope or syncope.  He denies anginal symptoms.  He's not had blood work drawn in over a year.  Past Medical History  Diagnosis Date  . Atrial fibrillation     chronic coumadin  . CAD (coronary artery disease)   . CHF (congestive heart failure)     Past Surgical History  Procedure Laterality Date  . Colonoscopy  06/04/2002    Normal colon and rectum  . Hip surgery      x4  both hips  . Back surgery    . Rotator cuff repair    . Coronary artery bypass graft  2003    LIMA to LAD,LIMA to distal RCA,SVG to ramus intermediate vessel.  . 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 Colonoscopy  07/08/2012    Procedure: COLONOSCOPY;  Surgeon: [name], MD;   Location: [facility];  Service: Endoscopy;  Laterality: N/A;  11:30  . US echocardiography  11/13/2011    LV mildly dilated,mild concentric LVH,LA mod - severely dilated,RA mildly dilated,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    Allergies  Allergen Reactions  . Augmentin [Amoxicillin-Pot Clavulanate] Nausea And Vomiting    Current Outpatient Prescriptions  Medication Sig Dispense Refill  . diltiazem (CARDIZEM CD) 240 MG 24 hr capsule Take 1 capsule (240 mg total) by mouth daily.  30 capsule  3  . ibuprofen (ADVIL,MOTRIN) 200 MG tablet Take 200 mg by mouth every 6 (six) hours as needed. Pain.      . metoprolol succinate (TOPROL-XL) 50 MG 24 hr tablet Take 1 tablet (50 mg total) by mouth daily.  30 tablet  5  . ramipril (ALTACE) 2.5 MG capsule Take 1 capsule (2.5 mg total) by mouth daily.  30 capsule  5  . warfarin (COUMADIN) 7.5 MG tablet TAKE 1 TO 1 AND 1/2 TABLETS BY MOUTH DAILY AS DIRECTED  40 tablet  5   No current facility-administered medications for this visit.    History   Social History  . Marital Status: Married    Spouse Name: N/A    Number of  Children: 2  . Years of Education: N/A   Occupational History  . 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 Smoking status: Former Smoker -- 0.50 packs/day for 2 years    Types: Cigarettes    Quit date: 09/03/1966  . Smokeless tobacco: Not on file     Comment: Quit about 40 years ago  . Alcohol Use: Yes     Comment: a glass of wine with dinner/ beer on the weekend  . Drug Use: No  . Sexual Activity: Not on file   Other Topics Concern  . Not on file   Social History Narrative  . No narrative on file    Family History  Problem Relation Age of Onset  . Colon cancer Neg Hx   . Heart attack Brother     ROS General: Negative; No fevers, chills, or night sweats HEENT: Negative; No changes in vision or hearing, sinus  congestion, difficulty swallowing Pulmonary: Negative; No cough, wheezing, shortness of breath, hemoptysis Cardiovascular: As per history of present illness, positive for CABG revascularization surgery, positive for atrial flutter ablation, positive for permanent atrial fibrillation; No chest pain, presyncope, syncope, palpatations GI: Negative; No nausea, vomiting, diarrhea, or abdominal pain GU: Negative; No dysuria, hematuria, or difficulty voiding Musculoskeletal: Positive for left hip surgery with revision; no myalgias, joint pain, or weakness Hematologic: Negative; no easy bruising, bleeding Endocrine: Negative; no heat/cold intolerance; no diabetes Neuro: Negative; no changes in balance, headaches Skin: Negative; No rashes or skin lesions Psychiatric: Negative; No behavioral problems, depression Sleep: Negative; No snoring,  daytime sleepiness, hypersomnolence, bruxism, restless legs, hypnogognic hallucinations. Other comprehensive 14 point system review is negative     Physical Exam BP 118/78  Pulse 67  Ht _0  (1.854 m)  Wt 226 lb 9.6 oz (102.785 kg)  BMI 29.90 kg/m2 General: Alert, oriented, no distress.  Skin: normal turgor, no rashes, warm and dry HEENT: Normocephalic, atraumatic. Pupils equal round and reactive to light; sclera anicteric; extraocular muscles intact, No lid lag; Nose without nasal septal hypertrophy; Mouth/Parynx benign; Mallinpatti scale 2 Neck: No JVD, no carotid bruits; normal carotid upstroke Lungs: clear to ausculatation and percussion bilaterally; no wheezing or rales, normal inspiratory and expiratory effort Chest wall: without tenderness to palpitation Heart: PMI not displaced, RRR, s1 s2 normal, 1/6systolic murmur, No diastolic murmur, no rubs, gallops, thrills, or heaves Abdomen: soft, nontender; no hepatosplenomehaly, BS+; abdominal aorta nontender and not dilated by palpation. Back: no CVA tenderness Pulses: 2+  Musculoskeletal: full range of  motion, normal strength, no joint deformities Extremities: Pulses 2+, no clubbing cyanosis or edema, Homan's sign negative  Neurologic: grossly nonfocal; Cranial nerves grossly wnl Psychologic: Normal mood and affect    ECG (independently read by me): Atrial fibrillation at 67 beats per minute.  Rate control.  QTc interval 420 ms.  LABS:  BMET    Component Value Date/Time   NA 140 12/08/2010 0446   K 4.6 12/08/2010 0446   CL 108 12/08/2010 0446   CO2 27 12/08/2010 0446   GLUCOSE 161* 12/08/2010 0446   BUN 15 12/08/2010 0446   CREATININE 0.91 12/08/2010 0446   CALCIUM 8.7 12/08/2010 0446   GFRNONAA >60 12/08/2010 0446   GFRAA  Value: >60        The eGFR has been calculated using the MDRD equation. This calculation has not been validated in all clinical situations. eGFR's persistently <60 mL/min signify possible Chronic Kidney Disease. 12/08/2010 0446  Hepatic Function Panel     Component Value Date/Time   PROT 7.5 11/28/2010 0925   ALBUMIN 4.4 11/28/2010 0925   AST 23 11/28/2010 0925   ALT 28 11/28/2010 0925   ALKPHOS 75 11/28/2010 0925   BILITOT 1.1 11/28/2010 0925     CBC    Component Value Date/Time   WBC 11.4* 12/08/2010 0446   RBC 3.37* 12/08/2010 0446   HGB 10.9* 12/08/2010 0446   HCT 32.9* 12/08/2010 0446   PLT 145* 12/08/2010 0446   MCV 97.6 12/08/2010 0446   MCH 32.3 12/08/2010 0446   MCHC 33.1 12/08/2010 0446   RDW 12.9 12/08/2010 0446     BNP No results found for this basename: probnp    Lipid Panel  No results found for this basename: chol, trig, hdl, cholhdl, vldl, ldlcalc     RADIOLOGY: No results found.    ASSESSMENT AND PLAN: Mr. Leonard Cisneros is now 73 years old and is 12 years status post CABG revascularization surgery by Dr. Roxy Manns and has bilateral arterial conduits to his LAD and RCA, as well as a vein graft to the intermediate vessel.  He is in permanent atrial fibrillation and is on chronic Coumadin therapy.  He tells me his INR last week was 3.1.  I am recommending  a complete set of blood work be obtained in the fasting state.  Currently, his atrial fibrillation rate is controlled with combination Cardizem, and metoprolol extended release.  He is on low-dose ACE inhibition for his CAD.  Presently he's not on any lipid-lowering therapy.  Two years ago his LDL was elevated at 116.  In the past he had been on atorvastatin.  We will contact him regarding the results of his blood work and adjustments will be made if necessary to his medical regimen.  I encouraged him on his continued exercise program.  We discussed weight loss.  He has lost 4 pounds since last year.  I will see him in one year for cardiology reevaluation.     [name], MD, [facility]  01/04/2014 9:21 AM

## 2014-01-05 ENCOUNTER — Encounter: Payer: Self-pay | Admitting: Cardiovascular Disease

## 2014-01-05 ENCOUNTER — Other Ambulatory Visit: Payer: Self-pay | Admitting: *Deleted

## 2014-01-05 MED ORDER — DILTIAZEM HCL ER COATED BEADS 240 MG PO CP24: 240.0000 mg | ORAL_CAPSULE | Freq: Every day | ORAL | Status: DC

## 2014-01-05 NOTE — Telephone Encounter (Signed)
Rx was sent to pharmacy electronically. 

## 2014-01-13 LAB — COMPREHENSIVE METABOLIC PANEL
ALBUMIN: 4.3 g/dL (ref 3.5–5.2)
ALT: 22 U/L (ref 0–53)
AST: 20 U/L (ref 0–37)
Alkaline Phosphatase: 55 U/L (ref 39–117)
BUN: 14 mg/dL (ref 6–23)
CALCIUM: 9 mg/dL (ref 8.4–10.5)
CHLORIDE: 108 meq/L (ref 96–112)
CO2: 24 meq/L (ref 19–32)
CREATININE: 0.8 mg/dL (ref 0.50–1.35)
Glucose, Bld: 96 mg/dL (ref 70–99)
POTASSIUM: 4.4 meq/L (ref 3.5–5.3)
Sodium: 140 mEq/L (ref 135–145)
Total Bilirubin: 0.7 mg/dL (ref 0.2–1.2)
Total Protein: 6.5 g/dL (ref 6.0–8.3)

## 2014-01-13 LAB — LIPID PANEL
CHOL/HDL RATIO: 2.8 ratio
CHOLESTEROL: 163 mg/dL (ref 0–200)
HDL: 59 mg/dL (ref >39)
LDL Cholesterol: 87 mg/dL (ref 0–99)
Triglycerides: 84 mg/dL (ref <150)
VLDL: 17 mg/dL (ref 0–40)

## 2014-01-13 LAB — CBC
HCT: 40.5 % (ref 39.0–52.0)
HEMOGLOBIN: 14.2 g/dL (ref 13.0–17.0)
MCH: 33 pg (ref 26.0–34.0)
MCHC: 35.1 g/dL (ref 30.0–36.0)
MCV: 94.2 fL (ref 78.0–100.0)
Platelets: 185 10*3/uL (ref 150–400)
RBC: 4.3 MIL/uL (ref 4.22–5.81)
RDW: 14.2 % (ref 11.5–15.5)
WBC: 5.1 10*3/uL (ref 4.0–10.5)

## 2014-01-14 LAB — TSH: TSH: 1.901 u[IU]/mL (ref 0.350–4.500)

## 2014-01-21 ENCOUNTER — Encounter: Payer: Self-pay | Admitting: *Deleted

## 2014-01-28 ENCOUNTER — Other Ambulatory Visit: Payer: Self-pay

## 2014-01-28 MED ORDER — METOPROLOL SUCCINATE ER 50 MG PO TB24: 50.0000 mg | ORAL_TABLET | Freq: Every day | ORAL | Status: DC

## 2014-01-28 NOTE — Telephone Encounter (Signed)
Rx was sent to pharmacy electronically. 

## 2014-03-10 ENCOUNTER — Other Ambulatory Visit: Payer: Self-pay | Admitting: Pharmacist Clinician (PhC)/ Clinical Pharmacy Specialist

## 2014-03-10 ENCOUNTER — Ambulatory Visit (INDEPENDENT_AMBULATORY_CARE_PROVIDER_SITE_OTHER): Payer: Medicare Other | Admitting: Pharmacist

## 2014-03-10 DIAGNOSIS — Z7901 Long term (current) use of anticoagulants: Secondary | ICD-10-CM

## 2014-03-10 DIAGNOSIS — I4891 Unspecified atrial fibrillation: Secondary | ICD-10-CM

## 2014-03-10 LAB — PT WITH INR/FINGERSTICK
INR, fingerstick: 2.8 — ABNORMAL HIGH (ref 0.80–1.20)
PT, fingerstick: 33.6 seconds — ABNORMAL HIGH (ref 10.4–12.5)

## 2014-03-29 ENCOUNTER — Encounter: Payer: Self-pay | Admitting: Family Medicine

## 2014-03-29 ENCOUNTER — Ambulatory Visit (INDEPENDENT_AMBULATORY_CARE_PROVIDER_SITE_OTHER): Payer: Medicare Other | Admitting: Family Medicine

## 2014-03-29 VITALS — BP 132/86 | Ht 73.0 in | Wt 223.4 lb

## 2014-03-29 DIAGNOSIS — M216X9 Other acquired deformities of unspecified foot: Secondary | ICD-10-CM

## 2014-03-29 DIAGNOSIS — G609 Hereditary and idiopathic neuropathy, unspecified: Secondary | ICD-10-CM

## 2014-03-29 DIAGNOSIS — M21371 Foot drop, right foot: Secondary | ICD-10-CM

## 2014-03-29 DIAGNOSIS — L0291 Cutaneous abscess, unspecified: Secondary | ICD-10-CM

## 2014-03-29 DIAGNOSIS — L0390 Cellulitis, unspecified: Secondary | ICD-10-CM

## 2014-03-29 MED ORDER — CLINDAMYCIN HCL 150 MG PO CAPS: 150.0000 mg | ORAL_CAPSULE | Freq: Four times a day (QID) | ORAL | Status: DC

## 2014-03-29 NOTE — Progress Notes (Signed)
   Subjective:    Patient ID: Leonard Cisneros, male    DOB: Jan 04, 1941, 73 y.o.   MRN: [id number]  HPI  Patietn arrives with cut to bottom of right foot-occurred Sat in house-not sure what he stepped on but wonders if something is in it due to level of pain.  Started hurting yesterday significantly   Felt shooting pain and worse  No fever or chills  History of progressive numbness in the feet. See prior note. We did some tremor workup in the past 4 peripheral neuropathy. He feels if anything it's gotten somewhat worse.  Review of Systems No headache no chest pain no back pain no vomiting no diarrhea ROS otherwise negative    Objective:   Physical Exam  Alert no acute distress. Lungs clear heart rare rhythm HET normal feet sensation diminished distally pulses intact. Impulse foot erythema tenderness warmth small puncture wound noted. Sent for x-ray. Of note x-ray showed foreign body      Assessment & Plan:  Impression 1 cellulitis discussed allergic to penicillin Augmentin etc. #2 peripheral neuropathy progressive plan appropriate blood work. Initiate clindamycin. Surgery referral. WSL

## 2014-03-30 ENCOUNTER — Other Ambulatory Visit: Payer: Self-pay | Admitting: *Deleted

## 2014-03-30 ENCOUNTER — Telehealth: Payer: Self-pay | Admitting: Family Medicine

## 2014-03-30 ENCOUNTER — Ambulatory Visit (HOSPITAL_COMMUNITY)
Admission: RE | Admit: 2014-03-30 | Discharge: 2014-03-30 | Disposition: A | Discharge status: Home / Self Care | Payer: Medicare Other | Source: Ambulatory Visit | Attending: Family Medicine | Admitting: Family Medicine

## 2014-03-30 DIAGNOSIS — Z189 Retained foreign body fragments, unspecified material: Secondary | ICD-10-CM

## 2014-03-30 DIAGNOSIS — S91309A Unspecified open wound, unspecified foot, initial encounter: Secondary | ICD-10-CM

## 2014-03-30 DIAGNOSIS — M7989 Other specified soft tissue disorders: Secondary | ICD-10-CM | POA: Diagnosis not present

## 2014-03-30 LAB — FOLATE: Folate: 10.1 ng/mL

## 2014-03-30 LAB — HEMOGLOBIN A1C
HEMOGLOBIN A1C: 5.5 % (ref <5.7)
Mean Plasma Glucose: 111 mg/dL (ref <117)

## 2014-03-30 LAB — VITAMIN B12: VITAMIN B 12: 360 pg/mL (ref 211–911)

## 2014-03-30 MED ORDER — RAMIPRIL 2.5 MG PO CAPS: 2.5000 mg | ORAL_CAPSULE | Freq: Every day | ORAL | Status: DC

## 2014-03-30 MED ORDER — OXYCODONE-ACETAMINOPHEN 5-325 MG PO TABS: ORAL_TABLET | ORAL | Status: DC

## 2014-03-30 NOTE — Progress Notes (Signed)
Pt notified and verbalized understanding. Percocet waiting up front for pt to pick up. He will then make a f/u infection assessment with Dr. Richardson Landry on Friday. Referral is in for surgery. Pt has crutches at home that he will use to keep weight off of foot.

## 2014-03-30 NOTE — Telephone Encounter (Signed)
Calling to get results to xray he had on his foot yesterday, also would like Rx for percocet.

## 2014-03-30 NOTE — Telephone Encounter (Signed)
One q 4 to 6 prn pain

## 2014-03-30 NOTE — Telephone Encounter (Signed)
Would like a rx for percocet for the pain in his right foot

## 2014-03-30 NOTE — Telephone Encounter (Signed)
Yes, I put in the referral for the surgery.   Need the directions for the percocet, please.

## 2014-03-30 NOTE — Telephone Encounter (Signed)
Tell pt i already had signed off on his xray earleir and you were all to call him shortly see results. Perc 5/325, also have pt wear crutches, rx if necessary to keep weight off involved foot pending ref

## 2014-03-30 NOTE — Telephone Encounter (Signed)
Pt notified and verbalized understanding. Percocet waiting up front for pt to pick up. He will then make a f/u infection assessment with Dr. Richardson Landry on Friday. Referral is in for surgery. Pt has crutches at home that he will use to keep weight off of foot.

## 2014-03-31 ENCOUNTER — Encounter (HOSPITAL_COMMUNITY): Payer: Self-pay | Admitting: Emergency Medicine

## 2014-03-31 ENCOUNTER — Inpatient Hospital Stay (HOSPITAL_COMMUNITY)
Admission: EM | Admit: 2014-03-31 | Discharge: 2014-04-02 | DRG: 908 | Disposition: A | Discharge status: Home / Self Care | Payer: Medicare Other | Attending: Internal Medicine | Admitting: Internal Medicine

## 2014-03-31 DIAGNOSIS — I4891 Unspecified atrial fibrillation: Secondary | ICD-10-CM | POA: Diagnosis present

## 2014-03-31 DIAGNOSIS — I482 Chronic atrial fibrillation, unspecified: Secondary | ICD-10-CM

## 2014-03-31 DIAGNOSIS — Z1211 Encounter for screening for malignant neoplasm of colon: Secondary | ICD-10-CM

## 2014-03-31 DIAGNOSIS — Z6829 Body mass index (BMI) 29.0-29.9, adult: Secondary | ICD-10-CM | POA: Diagnosis not present

## 2014-03-31 DIAGNOSIS — R7989 Other specified abnormal findings of blood chemistry: Secondary | ICD-10-CM | POA: Diagnosis present

## 2014-03-31 DIAGNOSIS — I509 Heart failure, unspecified: Secondary | ICD-10-CM | POA: Diagnosis present

## 2014-03-31 DIAGNOSIS — S90851A Superficial foreign body, right foot, initial encounter: Secondary | ICD-10-CM

## 2014-03-31 DIAGNOSIS — Z1881 Retained glass fragments: Secondary | ICD-10-CM | POA: Diagnosis not present

## 2014-03-31 DIAGNOSIS — R7309 Other abnormal glucose: Secondary | ICD-10-CM

## 2014-03-31 DIAGNOSIS — Z96649 Presence of unspecified artificial hip joint: Secondary | ICD-10-CM | POA: Diagnosis not present

## 2014-03-31 DIAGNOSIS — I2510 Atherosclerotic heart disease of native coronary artery without angina pectoris: Secondary | ICD-10-CM | POA: Diagnosis present

## 2014-03-31 DIAGNOSIS — L03115 Cellulitis of right lower limb: Secondary | ICD-10-CM

## 2014-03-31 DIAGNOSIS — L02619 Cutaneous abscess of unspecified foot: Secondary | ICD-10-CM

## 2014-03-31 DIAGNOSIS — M795 Residual foreign body in soft tissue: Secondary | ICD-10-CM

## 2014-03-31 DIAGNOSIS — Z7901 Long term (current) use of anticoagulants: Secondary | ICD-10-CM

## 2014-03-31 DIAGNOSIS — E7489 Other specified disorders of carbohydrate metabolism: Secondary | ICD-10-CM

## 2014-03-31 DIAGNOSIS — M7989 Other specified soft tissue disorders: Secondary | ICD-10-CM | POA: Diagnosis present

## 2014-03-31 DIAGNOSIS — S91309A Unspecified open wound, unspecified foot, initial encounter: Secondary | ICD-10-CM | POA: Diagnosis present

## 2014-03-31 DIAGNOSIS — R791 Abnormal coagulation profile: Secondary | ICD-10-CM

## 2014-03-31 DIAGNOSIS — M75102 Unspecified rotator cuff tear or rupture of left shoulder, not specified as traumatic: Secondary | ICD-10-CM

## 2014-03-31 DIAGNOSIS — W268XXA Contact with other sharp object(s), not elsewhere classified, initial encounter: Secondary | ICD-10-CM | POA: Diagnosis present

## 2014-03-31 DIAGNOSIS — E785 Hyperlipidemia, unspecified: Secondary | ICD-10-CM | POA: Diagnosis present

## 2014-03-31 DIAGNOSIS — Z951 Presence of aortocoronary bypass graft: Secondary | ICD-10-CM | POA: Diagnosis not present

## 2014-03-31 DIAGNOSIS — L089 Local infection of the skin and subcutaneous tissue, unspecified: Secondary | ICD-10-CM

## 2014-03-31 DIAGNOSIS — Z8249 Family history of ischemic heart disease and other diseases of the circulatory system: Secondary | ICD-10-CM

## 2014-03-31 DIAGNOSIS — Z79899 Other long term (current) drug therapy: Secondary | ICD-10-CM

## 2014-03-31 DIAGNOSIS — L03119 Cellulitis of unspecified part of limb: Secondary | ICD-10-CM | POA: Diagnosis not present

## 2014-03-31 DIAGNOSIS — I739 Peripheral vascular disease, unspecified: Secondary | ICD-10-CM | POA: Diagnosis present

## 2014-03-31 DIAGNOSIS — E669 Obesity, unspecified: Secondary | ICD-10-CM

## 2014-03-31 DIAGNOSIS — Z87891 Personal history of nicotine dependence: Secondary | ICD-10-CM | POA: Diagnosis not present

## 2014-03-31 DIAGNOSIS — I10 Essential (primary) hypertension: Secondary | ICD-10-CM | POA: Diagnosis present

## 2014-03-31 LAB — CBC WITH DIFFERENTIAL/PLATELET
Basophils Absolute: 0 10*3/uL (ref 0.0–0.1)
Basophils Relative: 0 % (ref 0–1)
EOS ABS: 0.4 10*3/uL (ref 0.0–0.7)
Eosinophils Relative: 4 % (ref 0–5)
HCT: 38.9 % — ABNORMAL LOW (ref 39.0–52.0)
HEMOGLOBIN: 13.6 g/dL (ref 13.0–17.0)
Lymphocytes Relative: 16 % (ref 12–46)
Lymphs Abs: 1.6 10*3/uL (ref 0.7–4.0)
MCH: 33.7 pg (ref 26.0–34.0)
MCHC: 35 g/dL (ref 30.0–36.0)
MCV: 96.5 fL (ref 78.0–100.0)
MONOS PCT: 11 % (ref 3–12)
Monocytes Absolute: 1.1 10*3/uL — ABNORMAL HIGH (ref 0.1–1.0)
NEUTROS PCT: 69 % (ref 43–77)
Neutro Abs: 6.9 10*3/uL (ref 1.7–7.7)
Platelets: 156 10*3/uL (ref 150–400)
RBC: 4.03 MIL/uL — ABNORMAL LOW (ref 4.22–5.81)
RDW: 13.3 % (ref 11.5–15.5)
WBC: 10 10*3/uL (ref 4.0–10.5)

## 2014-03-31 LAB — BASIC METABOLIC PANEL
Anion gap: 14 (ref 5–15)
BUN: 17 mg/dL (ref 6–23)
CO2: 20 mEq/L (ref 19–32)
Calcium: 8.8 mg/dL (ref 8.4–10.5)
Chloride: 106 mEq/L (ref 96–112)
Creatinine, Ser: 0.84 mg/dL (ref 0.50–1.35)
GFR, EST NON AFRICAN AMERICAN: 85 mL/min — AB (ref >90)
Glucose, Bld: 115 mg/dL — ABNORMAL HIGH (ref 70–99)
POTASSIUM: 4.3 meq/L (ref 3.7–5.3)
Sodium: 140 mEq/L (ref 137–147)

## 2014-03-31 LAB — PROTIME-INR
INR: 4.09 — ABNORMAL HIGH (ref 0.00–1.49)
INR: 4.71 — AB (ref 0.00–1.49)
PROTHROMBIN TIME: 44.3 s — AB (ref 11.6–15.2)
Prothrombin Time: 39.7 seconds — ABNORMAL HIGH (ref 11.6–15.2)

## 2014-03-31 MED ORDER — METOPROLOL SUCCINATE ER 50 MG PO TB24
50.0000 mg | ORAL_TABLET | Freq: Every day | ORAL | Status: DC
  Administered 2014-04-01 – 2014-04-02 (×2): 50 mg via ORAL
  Filled 2014-03-31 (×2): qty 1

## 2014-03-31 MED ORDER — VANCOMYCIN HCL IN DEXTROSE 1-5 GM/200ML-% IV SOLN
1000.0000 mg | INTRAVENOUS | Status: AC
  Administered 2014-03-31: 1000 mg via INTRAVENOUS
  Filled 2014-03-31: qty 200

## 2014-03-31 MED ORDER — OXYCODONE-ACETAMINOPHEN 5-325 MG PO TABS
1.0000 | ORAL_TABLET | ORAL | Status: DC | PRN
  Administered 2014-03-31 – 2014-04-01 (×2): 2 via ORAL
  Administered 2014-04-01: 1 via ORAL
  Administered 2014-04-01 – 2014-04-02 (×2): 2 via ORAL
  Filled 2014-03-31 (×3): qty 2
  Filled 2014-03-31: qty 1
  Filled 2014-03-31: qty 2

## 2014-03-31 MED ORDER — VANCOMYCIN HCL IN DEXTROSE 1-5 GM/200ML-% IV SOLN
1000.0000 mg | Freq: Once | INTRAVENOUS | Status: AC
  Administered 2014-03-31: 1000 mg via INTRAVENOUS
  Filled 2014-03-31: qty 200

## 2014-03-31 MED ORDER — SODIUM CHLORIDE 0.9 % IJ SOLN
3.0000 mL | Freq: Two times a day (BID) | INTRAMUSCULAR | Status: DC
  Administered 2014-03-31 – 2014-04-01 (×4): 3 mL via INTRAVENOUS

## 2014-03-31 MED ORDER — DILTIAZEM HCL ER COATED BEADS 240 MG PO CP24
240.0000 mg | ORAL_CAPSULE | Freq: Every day | ORAL | Status: DC
  Administered 2014-04-01: 240 mg via ORAL
  Filled 2014-03-31: qty 1

## 2014-03-31 MED ORDER — RAMIPRIL 2.5 MG PO CAPS
2.5000 mg | ORAL_CAPSULE | Freq: Every day | ORAL | Status: DC
  Administered 2014-04-01: 2.5 mg via ORAL
  Filled 2014-03-31: qty 1

## 2014-03-31 MED ORDER — CEFAZOLIN SODIUM-DEXTROSE 2-3 GM-% IV SOLR
2.0000 g | Freq: Three times a day (TID) | INTRAVENOUS | Status: DC
  Administered 2014-03-31 – 2014-04-02 (×7): 2 g via INTRAVENOUS
  Filled 2014-03-31 (×16): qty 50

## 2014-03-31 MED ORDER — VANCOMYCIN HCL 10 G IV SOLR
1250.0000 mg | Freq: Two times a day (BID) | INTRAVENOUS | Status: DC
  Administered 2014-03-31 – 2014-04-02 (×4): 1250 mg via INTRAVENOUS
  Filled 2014-03-31 (×10): qty 1250

## 2014-03-31 MED ORDER — PHYTONADIONE 5 MG PO TABS
5.0000 mg | ORAL_TABLET | Freq: Once | ORAL | Status: AC
  Administered 2014-03-31: 5 mg via ORAL
  Filled 2014-03-31: qty 1

## 2014-03-31 NOTE — ED Notes (Signed)
Called floor to give report, unit secretary reported unaware that pt is assigned to bed 334. Receiving unit will call back to give update.

## 2014-03-31 NOTE — ED Provider Notes (Signed)
CSN: [id number]     Arrival date & time 03/31/14  4235 History   First MD Initiated Contact with Patient 03/31/14 [phone]     Chief Complaint  Patient presents with  . Foot Pain    stepped on a piece of glass sat. pain bottom of right foot hurting     (Consider location/radiation/quality/duration/timing/severity/associated sxs/prior Treatment) Patient is a 73 y.o. male presenting with lower extremity pain. The history is provided by the patient.  Foot Pain  He injured his right foot about 4 days ago and saw his PCP at 2 days ago. He was noted to have some infection in his foot and was started on clindamycin and was sent for x-rays of the foot. X-rays were done yesterday and did show a foreign body which is presumed to be a piece of glass. He was going to be referred to a surgeon but he was concerned because his foot was getting more red and more swollen in spite of the antibiotic. He has not had any fever, chills, sweats. He has pain in the foot when he walks on it but not at rest. He does have peripheral neuropathy.  Past Medical History  Diagnosis Date  . Atrial fibrillation     chronic coumadin  . CAD (coronary artery disease)   . CHF (congestive heart failure)    Past Surgical History  Procedure Laterality Date  . Colonoscopy  06/04/2002    Normal colon and rectum  . Hip surgery      x4  both hips  . Back surgery    . Rotator cuff repair    . Coronary artery bypass graft  2003    LIMA to LAD,LIMA to distal RCA,SVG to ramus intermediate vessel.  . 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 Colonoscopy  07/08/2012    Procedure: COLONOSCOPY;  Surgeon: [name], MD;  Location: [facility];  Service: Endoscopy;  Laterality: N/A;  11:30  . US echocardiography  11/13/2011    LV mildly dilated,mild concentric LVH,LA mod - severely dilated,RA mildly dilated,mild to mod. mitral annular ca+,mild to mod  MR,aortic root ca+ w/mild dilatation,bicuspid AOV cannot be excluded.   Family History  Problem Relation Age of Onset  . Colon cancer Neg Hx   . Heart attack Brother    History  Substance Use Topics  . Smoking status: Former Smoker -- 0.50 packs/day for 2 years    Types: Cigarettes    Quit date: 09/03/1966  . Smokeless tobacco: Not on file     Comment: Quit about 40 years ago  . Alcohol Use: Yes     Comment: a glass of wine with dinner/ beer on the weekend    Review of Systems  All other systems reviewed and are negative.     Allergies  Augmentin  Home Medications   Prior to Admission medications   Medication Sig Start Date End Date Taking? Authorizing Provider  clindamycin (CLEOCIN) 150 MG capsule Take 1 capsule (150 mg total) by mouth 4 (four) times daily. 03/29/14   [name], MD  diltiazem (CARDIZEM CD) 240 MG 24 hr capsule Take 1 capsule (240 mg total) by mouth daily. 01/05/14   [name], MD  ibuprofen (ADVIL,MOTRIN) 200 MG tablet Take 200 mg by mouth every 6 (six) hours as needed. Pain.    Historical Provider, MD  metoprolol succinate (TOPROL-XL) 50 MG 24  hr tablet Take 1 tablet (50 mg total) by mouth daily. 01/28/14   [name], MD  oxyCODONE-acetaminophen (PERCOCET/ROXICET) 5-325 MG per tablet Take 1 tablet every 4-6 hours for pain as needed. 03/30/14   [name], MD  ramipril (ALTACE) 2.5 MG capsule Take 1 capsule (2.5 mg total) by mouth daily. 03/30/14   [name], MD  warfarin (COUMADIN) 7.5 MG tablet TAKE 1 TO 1 AND 1/2 TABLETS BY MOUTH DAILY AS DIRECTED 09/30/13   Tommy Medal, RPH-CPP   There were no vitals taken for this visit. Physical Exam  Nursing note and vitals reviewed.  73 year old male, resting comfortably and in no acute distress. Vital signs are significant for mild hypertension with blood pressure 144/75. Oxygen saturation is 96%, which is normal. Head is normocephalic and atraumatic. PERRLA, EOMI. Oropharynx is  clear. Neck is nontender and supple without adenopathy or JVD. Back is nontender and there is no CVA tenderness. Lungs are clear without rales, wheezes, or rhonchi. Chest is nontender. Heart has regular rate and rhythm without murmur. Abdomen is soft, flat, nontender without masses or hepatosplenomegaly and peristalsis is normoactive. Extremities: Venous stasis changes are present bilaterally. The right foot is very erythematous almost to the point of being violaceous with a mild to moderate swelling. The erythema does extend to the mid calf but there no lymphangitic streaks. The right foot and distal lower leg are warm to the touch compared with the left leg. Capillary refill is delayed. There is no clear puncture or laceration of the right foot but there is an indurated area overlying the second toe just distal to the MTP joint. Skin is warm and dry without rash. Neurologic: Mental status is normal, cranial nerves are intact, there are no motor deficits. Decreased sensation is noted in the feet and distal lower leg with normal sensation starting at about midcalf consistent with peripheral neuropathy.   ED Course  Procedures (including critical care time) Labs Review Results for orders placed during the hospital encounter of 03/31/14  CBC WITH DIFFERENTIAL      Result Value Ref Range   WBC 10.0  4.0 - 10.5 K/uL   RBC 4.03 (*) 4.22 - 5.81 MIL/uL   Hemoglobin 13.6  13.0 - 17.0 g/dL   HCT 38.9 (*) 39.0 - 52.0 %   MCV 96.5  78.0 - 100.0 fL   MCH 33.7  26.0 - 34.0 pg   MCHC 35.0  30.0 - 36.0 g/dL   RDW 13.3  11.5 - 15.5 %   Platelets 156  150 - 400 K/uL   Neutrophils Relative % 69  43 - 77 %   Neutro Abs 6.9  1.7 - 7.7 K/uL   Lymphocytes Relative 16  12 - 46 %   Lymphs Abs 1.6  0.7 - 4.0 K/uL   Monocytes Relative 11  3 - 12 %   Monocytes Absolute 1.1 (*) 0.1 - 1.0 K/uL   Eosinophils Relative 4  0 - 5 %   Eosinophils Absolute 0.4  0.0 - 0.7 K/uL   Basophils Relative 0  0 - 1 %    Basophils Absolute 0.0  0.0 - 0.1 K/uL  BASIC METABOLIC PANEL      Result Value Ref Range   Sodium 140  137 - 147 mEq/L   Potassium 4.3  3.7 - 5.3 mEq/L   Chloride 106  96 - 112 mEq/L   CO2 20  19 - 32 mEq/L   Glucose, Bld 115 (*)  70 - 99 mg/dL   BUN 17  6 - 23 mg/dL   Creatinine, Ser 0.84  0.50 - 1.35 mg/dL   Calcium 8.8  8.4 - 10.5 mg/dL   GFR calc non Af Amer 85 (*) >90 mL/min   GFR calc Af Amer >90  >90 mL/min   Anion gap 14  5 - 15  PROTIME-INR      Result Value Ref Range   Prothrombin Time 39.7 (*) 11.6 - 15.2 seconds   INR 4.09 (*) 0.00 - 1.49   Imaging Review Dg Foot Complete Right  03/30/2014   CLINICAL DATA:  Recent puncture wound, possible foreign body  EXAM: RIGHT FOOT COMPLETE - 3+ VIEW  COMPARISON:  None.  FINDINGS: No acute fracture or dislocation is noted. Diffuse vascular calcifications are noted. On the lateral projection there is a 4 mm radiopaque density identified projecting in the soft tissues just beyond the metatarsal-phalangeal articulation. This is not well appreciated on the frontal or oblique images unlikely obscured by overlying structures.  IMPRESSION: No acute fracture is noted.  Apparent soft tissue foreign body, possible shard of glass lying in the ball of the foot just beyond the metatarsophalangeal articulation.   Electronically Signed   By: Inez Catalina M.D.   On: 03/30/2014 08:42   Images viewed by me.  MDM   Final diagnoses:  Cellulitis of right foot  Foreign body in right foot with infection, initial encounter  Supratherapeutic INR    Cellulitis of the right foot secondary to foreign body. Mrs. getting worse in spite of oral antibiotics. He'll need to be admitted for IV antibiotics as well as surgical referral to have the foreign body removed. Case is discussed with Dr. Verlon Au of [facility] who agrees to come and see the patient and also Dr. Arnoldo Morale of general surgery who also agrees to come and see the patient. Of note, he does have  atrial fibrillation and is on warfarin. INR is slightly high at 4.09.    Delora Fuel, MD 78/24/23 [id number]

## 2014-03-31 NOTE — Progress Notes (Signed)
ANTICOAGULATION CONSULT NOTE - Initial Consult  Pharmacy Consult for Heparin Indication: atrial fibrillation  Allergies  Allergen Reactions  . Augmentin [Amoxicillin-Pot Clavulanate] Nausea And Vomiting    Patient Measurements: Height: 6\' 1"  (185.4 cm) Weight: 222 lb (100.699 kg) IBW/kg (Calculated) : 79.9 Heparin Dosing Weight: 100 kg  Vital Signs: Temp: 97.9 F (36.6 C) (07/29 1501) Temp src: Oral (07/29 1501) BP: 120/59 mmHg (07/29 1501) Pulse Rate: 65 (07/29 1501)  Labs:  Recent Labs  03/31/14 0640 03/31/14 1547  HGB 13.6  --   HCT 38.9*  --   PLT 156  --   LABPROT 39.7* 44.3*  INR 4.09* 4.71*  CREATININE 0.84  --     Estimated Creatinine Clearance: 99.2 ml/min (by C-G formula based on Cr of 0.84).   Medical History: Past Medical History  Diagnosis Date  . Atrial fibrillation     chronic coumadin  . CAD (coronary artery disease)   . CHF (congestive heart failure)     Medications:  Prescriptions prior to admission  Medication Sig Dispense Refill  . clindamycin (CLEOCIN) 150 MG capsule Take 1 capsule (150 mg total) by mouth 4 (four) times daily.  40 capsule  0  . diltiazem (CARDIZEM CD) 240 MG 24 hr capsule Take 1 capsule (240 mg total) by mouth daily.  30 capsule  11  . ibuprofen (ADVIL,MOTRIN) 200 MG tablet Take 200 mg by mouth every 6 (six) hours as needed. Pain.      . metoprolol succinate (TOPROL-XL) 50 MG 24 hr tablet Take 1 tablet (50 mg total) by mouth daily.  30 tablet  11  . oxyCODONE-acetaminophen (PERCOCET/ROXICET) 5-325 MG per tablet Take 1 tablet by mouth every 4 (four) hours as needed for moderate pain or severe pain.      . ramipril (ALTACE) 2.5 MG capsule Take 1 capsule (2.5 mg total) by mouth daily.  30 capsule  9  . warfarin (COUMADIN) 7.5 MG tablet Take 7.5-10 mg by mouth See admin instructions. 7.5 mg on Sunday, Tuesday, Thursday, Saturday.  10 mg on Monday, Wednesday, Friday.        Assessment: 73 yo M on chronic warfarin for Afib.   Home dose listed above.  INR elevated on admission.  He was admitted with foreign body in foot and needs surgery.   He received 5mg  IV Vit K x1 today.  Repeat INR still elevated.  No bleeding noted.   Goal of Therapy:  Heparin level 0.3-0.7 units/ml Monitor platelets by anticoagulation protocol: Yes INR 2-3   Plan:  Start heparin once INR <2 F/U INR in am  Biagio Borg 03/31/2014,4:35 PM

## 2014-03-31 NOTE — ED Notes (Signed)
Saw dr Wolfgang Phoenix for foot and was given antibiotics for it

## 2014-03-31 NOTE — ED Notes (Signed)
Surgeon at bedside.  

## 2014-03-31 NOTE — Consult Note (Signed)
Reason for Consult: Foreign body with cellulitis, right foot Referring Physician: Hospitalist  Leonard Cisneros is an 73 y.o. male.  HPI: Patient is a 73 year old white male who was found on x-ray of his right foot to have a foreign body, possible sliver of glass, in the right foot. He was being followed by Dr. Mickie Cisneros as an outpatient. Developed worsening redness and swelling in the right foot presented emergency room for further evaluation treatment. He is anticoagulated and his INR is 4.  Past Medical History  Diagnosis Date  . Atrial fibrillation     chronic coumadin  . CAD (coronary artery disease)   . CHF (congestive heart failure)     Past Surgical History  Procedure Laterality Date  . Colonoscopy  06/04/2002    Normal colon and rectum  . Hip surgery      x4  both hips  . Back surgery    . Rotator cuff repair    . Coronary artery bypass graft  2003    LIMA to LAD,LIMA to distal RCA,SVG to ramus intermediate vessel.  . 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 Colonoscopy  07/08/2012    Procedure: COLONOSCOPY;  Surgeon: Leonard Dolin, MD;  [facility];  Service: Endoscopy;  Laterality: N/A;  11:30  . US echocardiography  11/13/2011    LV mildly dilated,mild concentric LVH,LA mod - severely dilated,RA mildly dilated,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    Family History  Problem Relation Age of Onset  . Colon cancer Neg Hx   . Heart attack Brother     Social History:  reports that he quit smoking about 47 years ago. His smoking use included Cigarettes. He has a 1 pack-year smoking history. He does not have any smokeless tobacco history on file. He reports that he drinks alcohol. He reports that he does not use illicit drugs.  Allergies:  Allergies  Allergen Reactions  . Augmentin [Amoxicillin-Pot  Clavulanate] Nausea And Vomiting    Medications: I have reviewed the patient's current medications.  Results for orders placed during the hospital encounter of 03/31/14 (from the past 48 hour(s))  CBC WITH DIFFERENTIAL     Status: Abnormal   Collection Time    03/31/14  6:40 AM      Result Value Ref Range   WBC 10.0  4.0 - 10.5 K/uL   RBC 4.03 (*) 4.22 - 5.81 MIL/uL   Hemoglobin 13.6  13.0 - 17.0 g/dL   HCT 38.9 (*) 39.0 - 52.0 %   MCV 96.5  78.0 - 100.0 fL   MCH 33.7  26.0 - 34.0 pg   MCHC 35.0  30.0 - 36.0 g/dL   RDW 13.3  11.5 - 15.5 %   Platelets 156  150 - 400 K/uL   Neutrophils Relative % 69  43 - 77 %   Neutro Abs 6.9  1.7 - 7.7 K/uL   Lymphocytes Relative 16  12 - 46 %   Lymphs Abs 1.6  0.7 - 4.0 K/uL   Monocytes Relative 11  3 - 12 %   Monocytes Absolute 1.1 (*) 0.1 - 1.0 K/uL   Eosinophils Relative 4  0 - 5 %   Eosinophils Absolute 0.4  0.0 - 0.7 K/uL   Basophils Relative 0  0 - 1 %   Basophils Absolute  0.0  0.0 - 0.1 K/uL  BASIC METABOLIC PANEL     Status: Abnormal   Collection Time    03/31/14  6:40 AM      Result Value Ref Range   Sodium 140  137 - 147 mEq/L   Potassium 4.3  3.7 - 5.3 mEq/L   Chloride 106  96 - 112 mEq/L   CO2 20  19 - 32 mEq/L   Glucose, Bld 115 (*) 70 - 99 mg/dL   BUN 17  6 - 23 mg/dL   Creatinine, Ser 0.84  0.50 - 1.35 mg/dL   Calcium 8.8  8.4 - 10.5 mg/dL   GFR calc non Af Amer 85 (*) >90 mL/min   GFR calc Af Amer >90  >90 mL/min   Comment: (NOTE)     The eGFR has been calculated using the CKD EPI equation.     This calculation has not been validated in all clinical situations.     eGFR's persistently <90 mL/min signify possible Chronic Kidney     Disease.   Anion gap 14  5 - 15  PROTIME-INR     Status: Abnormal   Collection Time    03/31/14  6:40 AM      Result Value Ref Range   Prothrombin Time 39.7 (*) 11.6 - 15.2 seconds   INR 4.09 (*) 0.00 - 1.49    Dg Foot Complete Right  03/30/2014   CLINICAL DATA:  Recent puncture  wound, possible foreign body  EXAM: RIGHT FOOT COMPLETE - 3+ VIEW  COMPARISON:  None.  FINDINGS: No acute fracture or dislocation is noted. Diffuse vascular calcifications are noted. On the lateral projection there is a 4 mm radiopaque density identified projecting in the soft tissues just beyond the metatarsal-phalangeal articulation. This is not well appreciated on the frontal or oblique images unlikely obscured by overlying structures.  IMPRESSION: No acute fracture is noted.  Apparent soft tissue foreign body, possible shard of glass lying in the ball of the foot just beyond the metatarsophalangeal articulation.   Electronically Signed   By: Leonard Cisneros M.D.   On: 03/30/2014 08:42    ROS: See chart Blood pressure 127/76, pulse 67, temperature 97.8 F (36.6 C), temperature source Oral, resp. rate 20, height $RemoveBe'6\' 1"'IwNLwssxd$  (1.854 m), weight 100.699 kg (222 lb), SpO2 98.00%. Physical Exam: Pleasant white male no acute distress. Right foot with punctate wound along the saw the right foot at the second to third metatarsal head. No purulent drainage noted. Redness is noted around this region and also in the distal dorsal aspect of the right foot. Pulses are difficult to feel do to peripheral vascular disease. He also has evidence of vascular insufficiency. No open wound or gangrenous changes noted.  Assessment/Plan: Impression: Cellulitis of right foot secondary to foreign body, over anticoagulated Plan: [name] by the hospitalist to start IV antibiotics and I intend to taken to the operating room on Friday for exploration and removal of the foreign body in his right foot.  Leonard Cisneros A 03/31/2014, 1:37 PM

## 2014-03-31 NOTE — ED Notes (Signed)
Pt states "surgeon will perform surgery tomorrow afternoon, depending if my PT and INR levels come down."

## 2014-03-31 NOTE — Progress Notes (Signed)
ANTIBIOTIC CONSULT NOTE - INITIAL  Pharmacy Consult for Ancef & Vancomycin Indication: cellulitis  Allergies  Allergen Reactions  . Augmentin [Amoxicillin-Pot Clavulanate] Nausea And Vomiting    Patient Measurements: Height: 6\' 1"  (185.4 cm) Weight: 222 lb (100.699 kg) IBW/kg (Calculated) : 79.9  Vital Signs: Temp: 97.8 F (36.6 C) (07/29 0626) Temp src: Oral (07/29 0626) BP: 106/70 mmHg (07/29 0800) Pulse Rate: 75 (07/29 0800) Intake/Output from previous day:   Intake/Output from this shift:    Labs:  Recent Labs  03/31/14 0640  WBC 10.0  HGB 13.6  PLT 156  CREATININE 0.84   Estimated Creatinine Clearance: 99.2 ml/min (by C-G formula based on Cr of 0.84). No results found for this basename: VANCOTROUGH, VANCOPEAK, VANCORANDOM, GENTTROUGH, GENTPEAK, GENTRANDOM, TOBRATROUGH, TOBRAPEAK, TOBRARND, AMIKACINPEAK, AMIKACINTROU, AMIKACIN,  in the last 72 hours   Microbiology: No results found for this or any previous visit (from the past 720 hour(s)).  Medical History: Past Medical History  Diagnosis Date  . Atrial fibrillation     chronic coumadin  . CAD (coronary artery disease)   . CHF (congestive heart failure)     Medications:  Scheduled:    Assessment: 73 yo M admitted with foreign body in soft tissue of right foot.  He has failed outpatient therapy.   He is currently afebrile with normal WBC. Surgery consult pending.  Renal function is at patient's baseline.   Vancomycin 7/29>> Ancef 7/29>>  Goal of Therapy:  Vancomycin trough level 10-15 mcg/ml  Plan:  Ancef 2gm IV q8h Vancomycin 1gm IV x1 for total 2gm loading dose, then 1250mg  IV q12h Check Vancomycin trough at steady state Monitor renal function and cx data   Biagio Borg 03/31/2014,8:55 AM

## 2014-03-31 NOTE — ED Notes (Signed)
Dr. Verlon Au at bedside. NT obtaining EKG per verbal order from Dr. Verlon Au.

## 2014-03-31 NOTE — H&P (Signed)
[facility] History and Physical  Leonard Cisneros YBO:[id number] DOB: [birth year] DOA: 03/31/2014  Referring physician: ED PCP: Rubbie Battiest, MD  Specialists: Gen surgery  Chief Complaint: foot pain  HPI: 73 y/o ? h/o Afib CHad2Vasc2 score=2 on coumadin, CAD s/p CABG2003, EF in 11/2011 45-50%, Nuc study 11/21/12 low risk, HLD, rotator cuff syndrome, tubular adenoma on colonoscopy 07/08/12.  He was outside on 724/15 with his family having a meal and felt a twinge of pain and noted blood coming out of his right lower foot. He states that [facility] bandage on and sought medical care at his current care physician's office who gave him an antibiotic clindamycin 150 4 times a day to take to treat this. Because it was not improving an x-ray was performed yesterday showing soft tissue foreign body in the ball of the foot beyond the MTP articulation. He denies any constitutional symptoms as below He currently is not having chest pain He took his Coumadin this morning    Review of Systems: The patient denies  Nausea Vomiting Dark stool explained by stool Shortness of breath Weakness on one side of the body Fever Chills Blurred vision Double vision  Past Medical History  Diagnosis Date  . Atrial fibrillation     chronic coumadin  . CAD (coronary artery disease)   . CHF (congestive heart failure)    Past Surgical History  Procedure Laterality Date  . Colonoscopy  06/04/2002    Normal colon and rectum  . Hip surgery      x4  both hips  . Back surgery    . Rotator cuff repair    . Coronary artery bypass graft  2003    LIMA to LAD,LIMA to distal RCA,SVG to ramus intermediate vessel.  . 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 Colonoscopy  07/08/2012    Procedure: COLONOSCOPY;  Surgeon: [name], MD;  [facility];  Service: Endoscopy;  Laterality: N/A;  11:30  . US echocardiography  11/13/2011     LV mildly dilated,mild concentric LVH,LA mod - severely dilated,RA mildly dilated,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   Social History:  History   Social History Narrative  . No narrative on file    Allergies  Allergen Reactions  . Augmentin [Amoxicillin-Pot Clavulanate] Nausea And Vomiting    Family History  Problem Relation Age of Onset  . Colon cancer Neg Hx   . Heart attack Brother     Prior to Admission medications   Medication Sig Start Date End Date Taking? Authorizing Provider  clindamycin (CLEOCIN) 150 MG capsule Take 1 capsule (150 mg total) by mouth 4 (four) times daily. 03/29/14  Yes [name], MD  diltiazem (CARDIZEM CD) 240 MG 24 hr capsule Take 1 capsule (240 mg total) by mouth daily. 01/05/14  Yes [name], MD  metoprolol succinate (TOPROL-XL) 50 MG 24 hr tablet Take 1 tablet (50 mg total) by mouth daily. 01/28/14  Yes [name], MD  oxyCODONE-acetaminophen (PERCOCET/ROXICET) 5-325 MG per tablet Take 1 tablet every 4-6 hours for pain as needed. 03/30/14  Yes [name], MD  ramipril (ALTACE) 2.5 MG capsule Take 1 capsule (2.5 mg total) by mouth daily. 03/30/14  Yes [name], MD  warfarin (COUMADIN) 7.5 MG tablet TAKE 1 TO 1 AND 1/2 TABLETS BY MOUTH  DAILY AS DIRECTED 09/30/13  Yes Tommy Medal, RPH-CPP  ibuprofen (ADVIL,MOTRIN) 200 MG tablet Take 200 mg by mouth every 6 (six) hours as needed. Pain.    Historical Provider, MD   Physical Exam: Filed Vitals:   03/31/14 0626 03/31/14 0700  BP: 144/75 116/68  Pulse: 92 84  Temp: 97.8 F (36.6 C)   TempSrc: Oral   Resp: 16 20  Height: 6\' 1"  (1.854 m)   Weight: 100.699 kg (222 lb)   SpO2: 96% 93%     General:  EOMI NCAT  Eyes: PERRLA  ENT: Soft supple no JVD noted no bruits  Neck: Thick neck, Mallampati 2  Cardiovascular: S1-S2 irregularly irregular but rate controlled  Respiratory: Clinically clear  no added sound  Abdomen: Soft nontender nondistended no rebound  Skin: Diffuse erythema over dorsum of right foot compared to left foot. Puncture wound located in the middle of the forefoot with surrounding erythema  Musculoskeletal: Range of motion Moving all 4 limbs equally  Psychiatric: Euthymic  Neurologic: Grossly intact Maj. cranial nerves intact, major muscle groups moving equally without issue. Power 5/5.  Labs on Admission:  Basic Metabolic Panel:  Recent Labs Lab 03/31/14 0640  NA 140  K 4.3  CL 106  CO2 20  GLUCOSE 115*  BUN 17  CREATININE 0.84  CALCIUM 8.8   Liver Function Tests: No results found for this basename: AST, ALT, ALKPHOS, BILITOT, PROT, ALBUMIN,  in the last 168 hours No results found for this basename: LIPASE, AMYLASE,  in the last 168 hours No results found for this basename: AMMONIA,  in the last 168 hours CBC:  Recent Labs Lab 03/31/14 0640  WBC 10.0  NEUTROABS 6.9  HGB 13.6  HCT 38.9*  MCV 96.5  PLT 156   Cardiac Enzymes: No results found for this basename: CKTOTAL, CKMB, CKMBINDEX, TROPONINI,  in the last 168 hours  BNP (last 3 results) No results found for this basename: PROBNP,  in the last 8760 hours CBG: No results found for this basename: GLUCAP,  in the last 168 hours  Radiological Exams on Admission: Dg Foot Complete Right  03/30/2014   CLINICAL DATA:  Recent puncture wound, possible foreign body  EXAM: RIGHT FOOT COMPLETE - 3+ VIEW  COMPARISON:  None.  FINDINGS: No acute fracture or dislocation is noted. Diffuse vascular calcifications are noted. On the lateral projection there is a 4 mm radiopaque density identified projecting in the soft tissues just beyond the metatarsal-phalangeal articulation. This is not well appreciated on the frontal or oblique images unlikely obscured by overlying structures.  IMPRESSION: No acute fracture is noted.  Apparent soft tissue foreign body, possible shard of glass lying in the ball of the  foot just beyond the metatarsophalangeal articulation.   Electronically Signed   By: Inez Catalina M.D.   On: 03/30/2014 08:42    EKG: Independently reviewed. Nature fibrillation, QRS axis 90, no ST-T wave elevations. Shows A. fib at baseline  Assessment/Plan Principal Problem:   Foreign body (FB) in soft tissue-patient has failed outpatient therapy in addition has a radio opaque foreign body in right foot.  Will give 5 milligrams of vitamin K and right now as patient's INR on admission was 4.09-potentially secondary to use of clindamycin for this.  I presume he has had a tetanus shot. He received vancomycin in the emergency room which we will continue. Added Cefzil for MSSA/group B strep coverage. General surgery has been consulted.  Active Problems:   Chronic atrial fibrillation-CHad2Vasc2 score=3.  If his INR comes below 2.0 he'll need to be on a separate for secondary prophylaxis of stroke.  Monitor INR this evening as well as tomorrow morning. Continue her Toprol 50 daily for rate control   CAD (coronary artery disease)-currently stable. Presumably not on antiplatelet agent as is on systemic anticoagulation with Coumadin. Continue metoprolol XL 50 daily, ramipril 2.5 daily   Impaired glucose metabolism- monitor CBG orders for sliding scale will be given of 140   Morbidly obese-outpatient discussion as per PCP   55 minutes   inpatient Telemetry Full code   [place], [facility] Pager [phone]   If 7PM-7AM, please contact night-coverage [email or web address] Password [email or web address] 03/31/2014, 8:08 AM

## 2014-03-31 NOTE — ED Notes (Signed)
Pt room assignment changed to 320. Report attempted. Receiving RN called at same time, report given.

## 2014-04-01 ENCOUNTER — Inpatient Hospital Stay (HOSPITAL_COMMUNITY): Payer: Medicare Other

## 2014-04-01 LAB — CBC
HEMATOCRIT: 38.1 % — AB (ref 39.0–52.0)
Hemoglobin: 12.9 g/dL — ABNORMAL LOW (ref 13.0–17.0)
MCH: 33.2 pg (ref 26.0–34.0)
MCHC: 33.9 g/dL (ref 30.0–36.0)
MCV: 97.9 fL (ref 78.0–100.0)
Platelets: 181 10*3/uL (ref 150–400)
RBC: 3.89 MIL/uL — ABNORMAL LOW (ref 4.22–5.81)
RDW: 13.4 % (ref 11.5–15.5)
WBC: 9.6 10*3/uL (ref 4.0–10.5)

## 2014-04-01 LAB — COMPREHENSIVE METABOLIC PANEL
ALK PHOS: 116 U/L (ref 39–117)
ALT: 66 U/L — ABNORMAL HIGH (ref 0–53)
ANION GAP: 10 (ref 5–15)
AST: 46 U/L — ABNORMAL HIGH (ref 0–37)
Albumin: 3.5 g/dL (ref 3.5–5.2)
BILIRUBIN TOTAL: 1.5 mg/dL — AB (ref 0.3–1.2)
BUN: 15 mg/dL (ref 6–23)
CHLORIDE: 106 meq/L (ref 96–112)
CO2: 26 mEq/L (ref 19–32)
Calcium: 9.1 mg/dL (ref 8.4–10.5)
Creatinine, Ser: 0.85 mg/dL (ref 0.50–1.35)
GFR calc non Af Amer: 85 mL/min — ABNORMAL LOW (ref >90)
Glucose, Bld: 106 mg/dL — ABNORMAL HIGH (ref 70–99)
Potassium: 4.9 mEq/L (ref 3.7–5.3)
Sodium: 142 mEq/L (ref 137–147)
Total Protein: 6.9 g/dL (ref 6.0–8.3)

## 2014-04-01 LAB — TYPE AND SCREEN
ABO/RH(D): O POS
Antibody Screen: NEGATIVE

## 2014-04-01 LAB — GLUCOSE, CAPILLARY: Glucose-Capillary: 98 mg/dL (ref 70–99)

## 2014-04-01 LAB — PROTIME-INR
INR: 2.93 — ABNORMAL HIGH (ref 0.00–1.49)
INR: 1.8 — AB (ref 0.00–1.49)
PROTHROMBIN TIME: 30.6 s — AB (ref 11.6–15.2)
Prothrombin Time: 20.9 seconds — ABNORMAL HIGH (ref 11.6–15.2)

## 2014-04-01 MED ORDER — HEPARIN BOLUS VIA INFUSION
2000.0000 [IU] | Freq: Once | INTRAVENOUS | Status: AC
  Administered 2014-04-01: 2000 [IU] via INTRAVENOUS
  Filled 2014-04-01: qty 2000

## 2014-04-01 MED ORDER — PHYTONADIONE 5 MG PO TABS
5.0000 mg | ORAL_TABLET | Freq: Once | ORAL | Status: AC
  Administered 2014-04-01: 5 mg via ORAL
  Filled 2014-04-01: qty 1

## 2014-04-01 MED ORDER — CHLORHEXIDINE GLUCONATE 4 % EX LIQD
1.0000 "application " | Freq: Once | CUTANEOUS | Status: AC
  Administered 2014-04-01: 1 via TOPICAL
  Filled 2014-04-01: qty 15

## 2014-04-01 MED ORDER — HEPARIN (PORCINE) IN NACL 100-0.45 UNIT/ML-% IJ SOLN
1300.0000 [IU]/h | INTRAMUSCULAR | Status: DC
  Administered 2014-04-01: 1300 [IU]/h via INTRAVENOUS
  Filled 2014-04-01: qty 250

## 2014-04-01 NOTE — Progress Notes (Signed)
[place] for Heparin Indication: atrial fibrillation  Allergies  Allergen Reactions  . Augmentin [Amoxicillin-Pot Clavulanate] Nausea And Vomiting    Patient Measurements: Height: 6\' 1"  (185.4 cm) Weight: 222 lb (100.699 kg) IBW/kg (Calculated) : 79.9 Heparin Dosing Weight: 100 kg  Vital Signs: Temp: 98.8 F (37.1 C) (07/30 1500) Temp src: Oral (07/30 1500) BP: 110/73 mmHg (07/30 1500) Pulse Rate: 67 (07/30 1500)  Labs:  Recent Labs  03/31/14 0640 03/31/14 1547 04/01/14 0536 04/01/14 1538  HGB 13.6  --  12.9*  --   HCT 38.9*  --  38.1*  --   PLT 156  --  181  --   LABPROT 39.7* 44.3* 30.6* 20.9*  INR 4.09* 4.71* 2.93* 1.80*  CREATININE 0.84  --  0.85  --     Estimated Creatinine Clearance: 98 ml/min (by C-G formula based on Cr of 0.85).   Medical History: Past Medical History  Diagnosis Date  . Atrial fibrillation     chronic coumadin  . CAD (coronary artery disease)   . CHF (congestive heart failure)     Medications:  Prescriptions prior to admission  Medication Sig Dispense Refill  . clindamycin (CLEOCIN) 150 MG capsule Take 1 capsule (150 mg total) by mouth 4 (four) times daily.  40 capsule  0  . diltiazem (CARDIZEM CD) 240 MG 24 hr capsule Take 1 capsule (240 mg total) by mouth daily.  30 capsule  11  . ibuprofen (ADVIL,MOTRIN) 200 MG tablet Take 200 mg by mouth every 6 (six) hours as needed. Pain.      . metoprolol succinate (TOPROL-XL) 50 MG 24 hr tablet Take 1 tablet (50 mg total) by mouth daily.  30 tablet  11  . oxyCODONE-acetaminophen (PERCOCET/ROXICET) 5-325 MG per tablet Take 1 tablet by mouth every 4 (four) hours as needed for moderate pain or severe pain.      . ramipril (ALTACE) 2.5 MG capsule Take 1 capsule (2.5 mg total) by mouth daily.  30 capsule  9  . warfarin (COUMADIN) 7.5 MG tablet Take 7.5-10 mg by mouth See admin instructions. 7.5 mg on Sunday, Tuesday, Thursday, Saturday.  10 mg on Monday,  Wednesday, Friday.        Assessment: 73 yo M on chronic warfarin for Afib.  Home dose listed above.  INR elevated on admission.  He was admitted with foreign body in foot and needs surgery.   He received 5mg  IV Vit K x1 on 7/29 and Vit K 5mg  PO + FFP earlier today. No bleeding noted.  Noted plans for surgery tomorrow once INR appropriately reversed.  Discussed with Dr Jolee Ewing begin heparin for INR<2 and discontinue the heparin 4hrs prior to surgery.  Goal of Therapy:  Heparin level 0.3-0.7 units/ml Monitor platelets by anticoagulation protocol: Yes INR 2-3   Plan:  Heparin 2000 unit IV bolus (1/2 bolus since INR elevated) Heparin infusion at 1300 units/hr Check 6 hr heparin level Stop heparin 4hrs prior to surgery (~6am) Daily heparin level & CBC while on heparin F/U plans to resume Coumadin post-op  Biagio Borg 04/01/2014,5:00 PM

## 2014-04-01 NOTE — Progress Notes (Signed)
[place] for Heparin Indication: atrial fibrillation  Allergies  Allergen Reactions  . Augmentin [Amoxicillin-Pot Clavulanate] Nausea And Vomiting    Patient Measurements: Height: 6\' 1"  (185.4 cm) Weight: 222 lb (100.699 kg) IBW/kg (Calculated) : 79.9 Heparin Dosing Weight: 100 kg  Vital Signs: Temp: 98.6 F (37 C) (07/30 0604) Temp src: Oral (07/30 0604) BP: 136/64 mmHg (07/30 0604) Pulse Rate: 72 (07/30 0604)  Labs:  Recent Labs  03/31/14 0640 03/31/14 1547 04/01/14 0536  HGB 13.6  --  12.9*  HCT 38.9*  --  38.1*  PLT 156  --  181  LABPROT 39.7* 44.3* 30.6*  INR 4.09* 4.71* 2.93*  CREATININE 0.84  --  0.85    Estimated Creatinine Clearance: 98 ml/min (by C-G formula based on Cr of 0.85).   Medical History: Past Medical History  Diagnosis Date  . Atrial fibrillation     chronic coumadin  . CAD (coronary artery disease)   . CHF (congestive heart failure)     Medications:  Prescriptions prior to admission  Medication Sig Dispense Refill  . clindamycin (CLEOCIN) 150 MG capsule Take 1 capsule (150 mg total) by mouth 4 (four) times daily.  40 capsule  0  . diltiazem (CARDIZEM CD) 240 MG 24 hr capsule Take 1 capsule (240 mg total) by mouth daily.  30 capsule  11  . ibuprofen (ADVIL,MOTRIN) 200 MG tablet Take 200 mg by mouth every 6 (six) hours as needed. Pain.      . metoprolol succinate (TOPROL-XL) 50 MG 24 hr tablet Take 1 tablet (50 mg total) by mouth daily.  30 tablet  11  . oxyCODONE-acetaminophen (PERCOCET/ROXICET) 5-325 MG per tablet Take 1 tablet by mouth every 4 (four) hours as needed for moderate pain or severe pain.      . ramipril (ALTACE) 2.5 MG capsule Take 1 capsule (2.5 mg total) by mouth daily.  30 capsule  9  . warfarin (COUMADIN) 7.5 MG tablet Take 7.5-10 mg by mouth See admin instructions. 7.5 mg on Sunday, Tuesday, Thursday, Saturday.  10 mg on Monday, Wednesday, Friday.        Assessment: 73 yo M on  chronic warfarin for Afib.  Home dose listed above.  INR elevated on admission.  He was admitted with foreign body in foot and needs surgery.   He received 5mg  IV Vit K x1 on 7/29.  Repeat INR still elevated. Vit K 5mg  PO ordered for today. No bleeding noted.  Noted plans for surgery tomorrow if INR appropriately reversed.  Goal of Therapy:  Heparin level 0.3-0.7 units/ml Monitor platelets by anticoagulation protocol: Yes INR 2-3   Plan:  Start heparin once INR <2 F/U INR in am  Biagio Borg 04/01/2014,8:18 AM

## 2014-04-01 NOTE — Care Management Note (Addendum)
    Page 1 of 1   04/02/2014     2:32:43 PM CARE MANAGEMENT NOTE 04/02/2014  Patient:  Leonard Cisneros, Leonard Cisneros   Account Number:  [id number]  Date Initiated:  04/01/2014  Documentation initiated by:  Jolene Provost  Subjective/Objective Assessment:   Patient from home, lives with wife. Patient is independent with ADL's and has no DME needs. Patient is chronically taking coumadin for his A-fib and it is managed by Dr. Claiborne Billings through [facility].     Action/Plan:   If HH is needed at discharge patient requests [place] at discharge. Will continue to follow for CM needs.   Anticipated DC Date:     Anticipated DC Plan:        DC Planning Services  CM consult      Choice offered to / List presented to:             Status of service:  Completed, signed off Medicare Important Message given?  YES (If response is "NO", the following Medicare IM given date fields will be blank) Date Medicare IM given:  04/02/2014 Medicare IM given by:  Jolene Provost Date Additional Medicare IM given:   Additional Medicare IM given by:    Discharge Disposition:  HOME/SELF CARE  Per UR Regulation:    If discussed at Long Length of Stay Meetings, dates discussed:    Comments:  04/02/14 [place], RN BSN CM Pt discharged home today. No CM needs noted. No HH needs at this time.  04/02/2014 [place], RN, MSN, PCCN 04/01/2014 [place], RN, MSN, [employer]

## 2014-04-01 NOTE — Progress Notes (Signed)
UR chart review completed.  

## 2014-04-01 NOTE — Progress Notes (Signed)
Right foot is slightly improved. INR still elevated. It is coming down. We'll proceed with removal of foreign body in right foot tomorrow once INR has improved. The risks and benefits of the procedure were fully explained to the patient, who gave informed consent.

## 2014-04-01 NOTE — Progress Notes (Signed)
Note: This document was prepared with digital dictation and possible smart phrase technology. Any transcriptional errors that result from this process are unintentional.   CASHIS RILL WPY:[id number] DOB: 10-Mar-1941 DOA: 03/31/2014 PCP: Rubbie Battiest, MD  Brief narrative: 73 y/o ? h/o Afib CHad2Vasc2 score=2 on coumadin, CAD s/p CABG2003, EF in 11/2011 45-50%, Nuc study 11/21/12 low risk, HLD, rotator cuff syndrome, tubular adenoma on colonoscopy 07/08/12 admitted with increasing warmth, erythema RLE 2/2 to FB impacted in R forefoot in a setting of previously Rx cellulitis.  Foot xray showed glass shard INR on admit 4.  Gen surgery consulted    Past medical history-As per Problem list Chart reviewed as below- reviewed  Consultants:  Surgery  Procedures:  None yet  Antibiotics:  Vacnomycin 7/29  Cefazolin 7/29   Subjective  Alert pleasant in nad Pain improved   Objective    Interim History:   Telemetry: nsr   Objective: Filed Vitals:   04/01/14 1255 04/01/14 1315 04/01/14 1330 04/01/14 1419  BP: 106/63 104/65 110/53 115/66  Pulse: 74 91 73 74  Temp: 98.6 F (37 C) 98.7 F (37.1 C) 98.9 F (37.2 C) 98.6 F (37 C)  TempSrc: Oral Oral Oral Oral  Resp: 18 18 18 20   Height:      Weight:      SpO2: 96% 94% 98% 99%    Intake/Output Summary (Last 24 hours) at 04/01/14 1453 Last data filed at 04/01/14 1315  Gross per 24 hour  Intake 1432.5 ml  Output      0 ml  Net 1432.5 ml    Exam:  General: eomi ncat Cardiovascular: s1 s2 no m/r/g irreg Respiratory: clear no added sound Abdomen: soft, nt nd  Skin-RLE looks less erythematous Neuro intact  Data Reviewed: Basic Metabolic Panel:  Recent Labs Lab 03/31/14 0640 04/01/14 0536  NA 140 142  K 4.3 4.9  CL 106 106  CO2 20 26  GLUCOSE 115* 106*  BUN 17 15  CREATININE 0.84 0.85  CALCIUM 8.8 9.1   Liver Function Tests:  Recent Labs Lab 04/01/14 0536  AST 46*  ALT 66*  ALKPHOS 116  BILITOT  1.5*  PROT 6.9  ALBUMIN 3.5   No results found for this basename: LIPASE, AMYLASE,  in the last 168 hours No results found for this basename: AMMONIA,  in the last 168 hours CBC:  Recent Labs Lab 03/31/14 0640 04/01/14 0536  WBC 10.0 9.6  NEUTROABS 6.9  --   HGB 13.6 12.9*  HCT 38.9* 38.1*  MCV 96.5 97.9  PLT 156 181   Cardiac Enzymes: No results found for this basename: CKTOTAL, CKMB, CKMBINDEX, TROPONINI,  in the last 168 hours BNP: No components found with this basename: POCBNP,  CBG:  Recent Labs Lab 04/01/14 0718  GLUCAP 98    No results found for this or any previous visit (from the past 240 hour(s)).   Studies:              All Imaging reviewed and is as per above notation   Scheduled Meds: .  ceFAZolin (ANCEF) IV  2 g Intravenous Q8H  . diltiazem  240 mg Oral Daily  . metoprolol succinate  50 mg Oral Daily  . ramipril  2.5 mg Oral Daily  . sodium chloride  3 mL Intravenous Q12H  . vancomycin  1,250 mg Intravenous Q12H   Continuous Infusions:    Assessment/Plan:  Foreign body (FB) in soft tissue- cont vancomycin continue Cefzil for MSSA/group  B strep coverage. General surgery to perform surgery am if INR below 1.7.  Chronic atrial fibrillation-CHad2Vasc2 score=3. INR 2.4.  Given 2 U ffp as well as vit k on 7/30.  Pharmacy to dose heparin once INR below 2.0.  Continue her Toprol 50 daily for rate control  CAD (coronary artery disease)-currently stable. Not on antiplatelet agent as is on systemic anticoagulation with Coumadin. Continue metoprolol XL 50 daily, ramipril 2.5 daily  Impaired glucose metabolism- monitor CBG orders for sliding scale will be given of 140  Morbidly obese-outpatient discussion as per PCP Elevated LFT's-could be fatty liver.  Would recommend GI input as an OP.   Code Status: Full Family Communication:  None bedside Disposition Plan: inpatient tele   Verneita Griffes, MD  [facility] Pager [phone] 04/01/2014, 2:53 PM     LOS: 1 day

## 2014-04-02 ENCOUNTER — Ambulatory Visit: Payer: Medicare Other | Admitting: Family Medicine

## 2014-04-02 ENCOUNTER — Encounter (HOSPITAL_COMMUNITY): Payer: Self-pay | Admitting: *Deleted

## 2014-04-02 ENCOUNTER — Inpatient Hospital Stay (HOSPITAL_COMMUNITY): Payer: Medicare Other | Admitting: Anesthesiology

## 2014-04-02 ENCOUNTER — Encounter (HOSPITAL_COMMUNITY): Payer: Medicare Other | Admitting: Anesthesiology

## 2014-04-02 ENCOUNTER — Encounter (HOSPITAL_COMMUNITY)
Admission: EM | Disposition: A | Discharge status: Home / Self Care | Payer: Self-pay | Source: Home / Self Care | Attending: Family Medicine

## 2014-04-02 DIAGNOSIS — L089 Local infection of the skin and subcutaneous tissue, unspecified: Secondary | ICD-10-CM

## 2014-04-02 DIAGNOSIS — I4891 Unspecified atrial fibrillation: Secondary | ICD-10-CM

## 2014-04-02 DIAGNOSIS — Z7901 Long term (current) use of anticoagulants: Secondary | ICD-10-CM

## 2014-04-02 DIAGNOSIS — S90859A Superficial foreign body, unspecified foot, initial encounter: Secondary | ICD-10-CM

## 2014-04-02 HISTORY — PX: FOREIGN BODY REMOVAL: SHX962

## 2014-04-02 LAB — PREPARE FRESH FROZEN PLASMA
Unit division: 0
Unit division: 0

## 2014-04-02 LAB — PROTIME-INR
INR: 1.36 (ref 0.00–1.49)
PROTHROMBIN TIME: 16.8 s — AB (ref 11.6–15.2)

## 2014-04-02 LAB — BASIC METABOLIC PANEL
ANION GAP: 14 (ref 5–15)
BUN: 14 mg/dL (ref 6–23)
CALCIUM: 9.2 mg/dL (ref 8.4–10.5)
CO2: 24 mEq/L (ref 19–32)
Chloride: 102 mEq/L (ref 96–112)
Creatinine, Ser: 0.78 mg/dL (ref 0.50–1.35)
GFR calc Af Amer: >90 mL/min (ref >90)
GFR, EST NON AFRICAN AMERICAN: 88 mL/min — AB (ref >90)
Glucose, Bld: 118 mg/dL — ABNORMAL HIGH (ref 70–99)
Potassium: 4.1 mEq/L (ref 3.7–5.3)
SODIUM: 140 meq/L (ref 137–147)

## 2014-04-02 LAB — GLUCOSE, CAPILLARY: Glucose-Capillary: 117 mg/dL — ABNORMAL HIGH (ref 70–99)

## 2014-04-02 LAB — HEPARIN LEVEL (UNFRACTIONATED): Heparin Unfractionated: <0.1 IU/mL — ABNORMAL LOW (ref 0.30–0.70)

## 2014-04-02 LAB — SURGICAL PCR SCREEN
MRSA, PCR: NEGATIVE
Staphylococcus aureus: NEGATIVE

## 2014-04-02 SURGERY — FOREIGN BODY REMOVAL ADULT
Anesthesia: General | Site: Foot | Laterality: Right

## 2014-04-02 MED ORDER — FENTANYL CITRATE 0.05 MG/ML IJ SOLN: 25.0000 ug | INTRAMUSCULAR | Status: DC | PRN

## 2014-04-02 MED ORDER — FENTANYL CITRATE 0.05 MG/ML IJ SOLN
INTRAMUSCULAR | Status: AC
  Filled 2014-04-02: qty 2

## 2014-04-02 MED ORDER — PROPOFOL 10 MG/ML IV EMUL
INTRAVENOUS | Status: AC
  Filled 2014-04-02: qty 20

## 2014-04-02 MED ORDER — 0.9 % SODIUM CHLORIDE (POUR BTL) OPTIME
TOPICAL | Status: DC | PRN
  Administered 2014-04-02: 1000 mL

## 2014-04-02 MED ORDER — MIDAZOLAM HCL 2 MG/2ML IJ SOLN
INTRAMUSCULAR | Status: AC
  Filled 2014-04-02: qty 2

## 2014-04-02 MED ORDER — LIDOCAINE HCL 1 % IJ SOLN
INTRAMUSCULAR | Status: DC | PRN
  Administered 2014-04-02: 50 mg via INTRADERMAL

## 2014-04-02 MED ORDER — LIDOCAINE HCL (PF) 1 % IJ SOLN
INTRAMUSCULAR | Status: AC
  Filled 2014-04-02: qty 5

## 2014-04-02 MED ORDER — MIDAZOLAM HCL 2 MG/2ML IJ SOLN
1.0000 mg | INTRAMUSCULAR | Status: DC | PRN
  Administered 2014-04-02 (×2): 2 mg via INTRAVENOUS

## 2014-04-02 MED ORDER — LACTATED RINGERS IV SOLN: INTRAVENOUS | Status: DC

## 2014-04-02 MED ORDER — ONDANSETRON HCL 4 MG/2ML IJ SOLN
INTRAMUSCULAR | Status: AC
  Filled 2014-04-02: qty 2

## 2014-04-02 MED ORDER — KETOROLAC TROMETHAMINE 30 MG/ML IJ SOLN
30.0000 mg | Freq: Once | INTRAMUSCULAR | Status: AC
  Administered 2014-04-02: 30 mg via INTRAVENOUS
  Filled 2014-04-02: qty 1

## 2014-04-02 MED ORDER — PROPOFOL 10 MG/ML IV BOLUS
INTRAVENOUS | Status: DC | PRN
  Administered 2014-04-02: 100 mg via INTRAVENOUS
  Administered 2014-04-02: 20 mg via INTRAVENOUS

## 2014-04-02 MED ORDER — MIDAZOLAM HCL 5 MG/5ML IJ SOLN
INTRAMUSCULAR | Status: DC | PRN
  Administered 2014-04-02 (×2): 1 mg via INTRAVENOUS

## 2014-04-02 MED ORDER — FENTANYL CITRATE 0.05 MG/ML IJ SOLN
25.0000 ug | INTRAMUSCULAR | Status: AC
  Administered 2014-04-02 (×2): 25 ug via INTRAVENOUS

## 2014-04-02 MED ORDER — FENTANYL CITRATE 0.05 MG/ML IJ SOLN
INTRAMUSCULAR | Status: DC | PRN
  Administered 2014-04-02: 25 ug via INTRAVENOUS
  Administered 2014-04-02: 50 ug via INTRAVENOUS
  Administered 2014-04-02: 25 ug via INTRAVENOUS

## 2014-04-02 MED ORDER — ONDANSETRON HCL 4 MG/2ML IJ SOLN
4.0000 mg | Freq: Once | INTRAMUSCULAR | Status: AC
  Administered 2014-04-02: 4 mg via INTRAVENOUS

## 2014-04-02 MED ORDER — DOXYCYCLINE HYCLATE 100 MG PO TABS: 100.0000 mg | ORAL_TABLET | Freq: Two times a day (BID) | ORAL | Status: DC

## 2014-04-02 MED ORDER — POVIDONE-IODINE 10 % EX OINT
TOPICAL_OINTMENT | CUTANEOUS | Status: AC
  Filled 2014-04-02: qty 1

## 2014-04-02 MED ORDER — SULFAMETHOXAZOLE-TMP DS 800-160 MG PO TABS: 1.0000 | ORAL_TABLET | Freq: Two times a day (BID) | ORAL | Status: DC

## 2014-04-02 MED ORDER — POVIDONE-IODINE 10 % EX OINT
TOPICAL_OINTMENT | CUTANEOUS | Status: DC | PRN
  Administered 2014-04-02: 1 via TOPICAL

## 2014-04-02 MED ORDER — LACTATED RINGERS IV SOLN
INTRAVENOUS | Status: DC
  Administered 2014-04-02: 11:00:00 via INTRAVENOUS

## 2014-04-02 MED ORDER — OXYCODONE-ACETAMINOPHEN 5-325 MG PO TABS: 1.0000 | ORAL_TABLET | ORAL | Status: DC | PRN

## 2014-04-02 MED ORDER — ONDANSETRON HCL 4 MG/2ML IJ SOLN: 4.0000 mg | Freq: Once | INTRAMUSCULAR | Status: DC | PRN

## 2014-04-02 MED ORDER — HEPARIN (PORCINE) IN NACL 100-0.45 UNIT/ML-% IJ SOLN: 1600.0000 [IU]/h | INTRAMUSCULAR | Status: AC

## 2014-04-02 SURGICAL SUPPLY — 27 items
BAG HAMPER (MISCELLANEOUS) ×2 IMPLANT
BLADE SURG 15 STRL LF DISP TIS (BLADE) ×1 IMPLANT
BLADE SURG 15 STRL SS (BLADE) ×1
CLOTH BEACON ORANGE TIMEOUT ST (SAFETY) ×2 IMPLANT
COVER LIGHT HANDLE STERIS (MISCELLANEOUS) ×4 IMPLANT
COVER MAYO STAND XLG (DRAPE) ×2 IMPLANT
DRESSING COVERLET 3X1 FLEXIBLE (GAUZE/BANDAGES/DRESSINGS) ×2 IMPLANT
DRSG TEGADERM 2-3/8X2-3/4 SM (GAUZE/BANDAGES/DRESSINGS) ×2 IMPLANT
ELECT NEEDLE TIP 2.8 STRL (NEEDLE) ×2 IMPLANT
ELECT REM PT RETURN 9FT ADLT (ELECTROSURGICAL) ×2
ELECTRODE REM PT RTRN 9FT ADLT (ELECTROSURGICAL) ×1 IMPLANT
GLOVE BIOGEL PI IND STRL 7.0 (GLOVE) ×2 IMPLANT
GLOVE BIOGEL PI INDICATOR 7.0 (GLOVE) ×2
GLOVE SS BIOGEL STRL SZ 6.5 (GLOVE) ×1 IMPLANT
GLOVE SUPERSENSE BIOGEL SZ 6.5 (GLOVE) ×1
GLOVE SURG SS PI 7.5 STRL IVOR (GLOVE) ×4 IMPLANT
GOWN STRL REUS W/TWL LRG LVL3 (GOWN DISPOSABLE) ×6 IMPLANT
KIT ROOM TURNOVER APOR (KITS) ×2 IMPLANT
MANIFOLD NEPTUNE II (INSTRUMENTS) ×2 IMPLANT
NS IRRIG 1000ML POUR BTL (IV SOLUTION) ×2 IMPLANT
PACK MINOR (CUSTOM PROCEDURE TRAY) ×2 IMPLANT
PAD ARMBOARD 7.5X6 YLW CONV (MISCELLANEOUS) ×2 IMPLANT
SET BASIN LINEN APH (SET/KITS/TRAYS/PACK) ×2 IMPLANT
SOL PREP PROV IODINE SCRUB 4OZ (MISCELLANEOUS) ×2 IMPLANT
SPONGE GAUZE 2X2 8PLY STRL LF (GAUZE/BANDAGES/DRESSINGS) ×2 IMPLANT
SUT VIC AB 4-0 PS2 27 (SUTURE) ×2 IMPLANT
SYR BULB IRRIGATION 50ML (SYRINGE) ×2 IMPLANT

## 2014-04-02 NOTE — Anesthesia Postprocedure Evaluation (Signed)
  Anesthesia Post-op Note  Patient: Leonard Cisneros  Procedure(s) Performed: Procedure(s): FOREIGN BODY REMOVAL RIGHT FOOT (Right)  Patient Location: PACU  Anesthesia Type:General  Level of Consciousness: awake, alert , oriented and patient cooperative  Airway and Oxygen Therapy: Patient Spontanous Breathing  Post-op Pain: 3 /10, mild  Post-op Assessment: Post-op Vital signs reviewed, [name] Cardiovascular Status Stable, Respiratory Function Stable, Patent Airway and Pain level controlled  Post-op Vital Signs: Reviewed and stable  Last Vitals:  Filed Vitals:   04/02/14 1130  BP: 120/78  Pulse:   Temp:   Resp: 19    Complications: No apparent anesthesia complications

## 2014-04-02 NOTE — Transfer of Care (Signed)
Immediate Anesthesia Transfer of Care Note  Patient: Leonard Cisneros  Procedure(s) Performed: Procedure(s): FOREIGN BODY REMOVAL RIGHT FOOT (Right)  Patient Location: PACU  Anesthesia Type:General  Level of Consciousness: awake, alert  and patient cooperative  Airway & Oxygen Therapy: Patient Spontanous Breathing and Patient connected to face mask oxygen  Post-op Assessment: Report given to PACU RN, Post -op Vital signs reviewed and stable and Patient moving all extremities  Post vital signs: Reviewed and stable  Complications: No apparent anesthesia complications

## 2014-04-02 NOTE — Anesthesia Preprocedure Evaluation (Signed)
Anesthesia Evaluation  Patient identified by MRN, date of birth, ID band Patient awake    Reviewed: Allergy & Precautions, H&P , NPO status , [name] Chart, lab work & pertinent test results  Airway Mallampati: II TM Distance: >3 FB Neck ROM: Full    Dental  (+) Partial Upper   Pulmonary former smoker,  breath sounds clear to auscultation        Cardiovascular + CAD, + CABG and +CHF + dysrhythmias Atrial Fibrillation Rhythm:Regular Rate:Normal     Neuro/Psych    GI/Hepatic negative GI ROS,   Endo/Other    Renal/GU      Musculoskeletal   Abdominal   Peds  Hematology   Anesthesia Other Findings   Reproductive/Obstetrics                           Anesthesia Physical Anesthesia Plan  ASA: III  Anesthesia Plan: General   Post-op Pain Management:    Induction: Intravenous  Airway Management Planned: LMA  Additional Equipment:   Intra-op Plan:   Post-operative Plan: Extubation in OR  Informed Consent: I have reviewed the patients History and Physical, chart, labs and discussed the procedure including the risks, benefits and alternatives for the proposed anesthesia with the patient or authorized representative who has indicated his/her understanding and acceptance.     Plan Discussed with:   Anesthesia Plan Comments:         Anesthesia Quick Evaluation

## 2014-04-02 NOTE — Op Note (Signed)
Patient:  Leonard Cisneros  DOB:  1941-02-28  MRN:  [id number]   Preop Diagnosis:  Foreign body, right foot  Postop Diagnosis:  Same  Procedure:  Removal foreign body, right foot  Surgeon:  Aviva Signs, M.D.  Anes:  General  Indications:  Patient is a 73 year old white male with multiple medical problems who presents with cellulitis and abscess of the right foot. This occurred after sustaining an injury to the right foot. Stepped on some broken glass. His INR had to be corrected prior to surgical intervention. X-ray confirms a shard of glass in the right foot. The risks and benefits of the procedure were fully explained to the patient, who gave informed consent.  Procedure note:  The patient is placed the supine position. After general anesthesia was administered, the right foot was prepped and draped using usual sterile technique with Betadine. Surgical site confirmation was performed.  On the sole of the right foot, puncture wound is noted. This was slightly enlarged and probe. Some purulent fluid was noted. 2 small slivers of glass were removed. The rest of the wound is examined and no other foreign bodies were noted. Betadine ointment and dry sterile dressings were applied.  All tape and needle counts were correct at the end of the procedure. Patient was awakened and transferred to PACU in stable condition.  Complications:  None  EBL:  Minimal  Specimen:  Glass, disposed of

## 2014-04-02 NOTE — Progress Notes (Signed)
AVS reviewed with patient and patient's wife.  Prescription provided to patient.  Verbalized understanding of discharge instructions, wound care, physician follow-up, and medications.  Patient removed his IV.  MD aware.  Bleeding to site d/t heparin/Coumadin.  Bleeding stopped.  Site WNL.  Patient transported by NT via w/c to main entrance for discharge.  Patient stable at time of discharge.

## 2014-04-02 NOTE — Progress Notes (Signed)
[facility] for Heparin Indication: atrial fibrillation  Allergies  Allergen Reactions  . Augmentin [Amoxicillin-Pot Clavulanate] Nausea And Vomiting    Patient Measurements: Height: 6\' 1"  (185.4 cm) Weight: 222 lb (100.699 kg) IBW/kg (Calculated) : 79.9 Heparin Dosing Weight: 100 kg  Vital Signs: Temp: 98.6 F (37 C) (07/31 0641) Temp src: Oral (07/31 0641) BP: 121/74 mmHg (07/31 0641) Pulse Rate: 83 (07/31 0641)  Labs:  Recent Labs  03/31/14 0640  04/01/14 0536 04/01/14 1538 04/02/14 0013 04/02/14 0611  HGB 13.6  --  12.9*  --   --   --   HCT 38.9*  --  38.1*  --   --   --   PLT 156  --  181  --   --   --   LABPROT 39.7*  < > 30.6* 20.9*  --  16.8*  INR 4.09*  < > 2.93* 1.80*  --  1.36  HEPARINUNFRC  --   --   --   --  <0.10*  --   CREATININE 0.84  --  0.85  --   --  0.78  < > = values in this interval not displayed.  Estimated Creatinine Clearance: 104.1 ml/min (by C-G formula based on Cr of 0.78).   Medical History: Past Medical History  Diagnosis Date  . Atrial fibrillation     chronic coumadin  . CAD (coronary artery disease)   . CHF (congestive heart failure)     Medications:  Prescriptions prior to admission  Medication Sig Dispense Refill  . clindamycin (CLEOCIN) 150 MG capsule Take 1 capsule (150 mg total) by mouth 4 (four) times daily.  40 capsule  0  . diltiazem (CARDIZEM CD) 240 MG 24 hr capsule Take 1 capsule (240 mg total) by mouth daily.  30 capsule  11  . ibuprofen (ADVIL,MOTRIN) 200 MG tablet Take 200 mg by mouth every 6 (six) hours as needed. Pain.      . metoprolol succinate (TOPROL-XL) 50 MG 24 hr tablet Take 1 tablet (50 mg total) by mouth daily.  30 tablet  11  . oxyCODONE-acetaminophen (PERCOCET/ROXICET) 5-325 MG per tablet Take 1 tablet by mouth every 4 (four) hours as needed for moderate pain or severe pain.      . ramipril (ALTACE) 2.5 MG capsule Take 1 capsule (2.5 mg total) by mouth daily.  30  capsule  9  . warfarin (COUMADIN) 7.5 MG tablet Take 7.5-10 mg by mouth See admin instructions. 7.5 mg on Sunday, Tuesday, Thursday, Saturday.  10 mg on Monday, Wednesday, Friday.        Assessment: 73 yo M on chronic warfarin for Afib.  Home dose listed above.  INR elevated on admission.  He was admitted with foreign body in foot and needs surgery.   He received 5mg  IV Vit K x1 on 7/29 and Vit K 5mg  PO + FFP on7/30. No bleeding noted.  Discussed with Dr Delsa Grana started for [facility]. Initial heparin level sub-therapeutic.   Goal of Therapy:  Heparin level 0.3-0.7 units/ml Monitor platelets by anticoagulation protocol: Yes INR 2-3   Plan:  Increase Heparin infusion at 1600 units/hr Stop heparin 4hrs prior to surgery (~6am) F/U [facility] plans post-op  Biagio Borg 04/02/2014,8:23 AM

## 2014-04-02 NOTE — Progress Notes (Signed)
Right toes pink in color/warm to touch. Right foot red in color with mild edema. Moves right leg without difficulty.

## 2014-04-02 NOTE — Discharge Instructions (Signed)
Keep wound clean and dry daily.  Apply antibiotic ointment.

## 2014-04-02 NOTE — Progress Notes (Deleted)
Physician Discharge Summary  Leonard Cisneros DPO:[id number] DOB: 1941-04-12 DOA: 03/31/2014  PCP: Rubbie Battiest, MD  Admit date: 03/31/2014 Discharge date: 04/02/2014  Time spent: 35 minutes  Recommendations for Outpatient Follow-up:  1. Follow up with Surgery as an outpatient  Discharge Diagnoses:  Principal Problem:   Foreign body (FB) in soft tissue Active Problems:   High risk medication use   CAD (coronary artery disease)   Impaired glucose metabolism   Morbidly obese   Discharge Condition: stable  Diet recommendation: heart healthy  Filed Weights   03/31/14 0626 04/02/14 1015  Weight: 100.699 kg (222 lb) 100.699 kg (222 lb)    History of present illness:  73 y/o ? h/o Afib CHad2Vasc2 score=2 on coumadin, CAD s/p CABG2003, EF in 11/2011 45-50%, Nuc study 11/21/12 low risk, HLD, rotator cuff syndrome, tubular adenoma on colonoscopy 07/08/12. He was outside on 724/15 with his family having a meal and felt a twinge of pain and noted blood coming out of his right lower foot. He states that [facility] bandage on and sought medical care at his current care physician's office who gave him an antibiotic clindamycin 150 4 times a day to take to treat this. Because it was not improving an x-ray was performed yesterday showing soft tissue foreign body in the ball of the foot beyond the MTP articulation.  He denies any constitutional symptoms as below   Hospital Course:  Foreign body (FB) in soft tissue - started empirically on  vancomycin continue Cefzil for MSSA/group B strep coverage.  - General surgery to perform surgery to piece of glass. - de-escalated treatment to doxy. - pt want to go home i explained to him that it would benfit to be in house one more day but still wanted to go home despite risk.  Chronic atrial fibrillation: - CHad2Vasc2 score=3. INR 2.4. Given 2 U ffp as well as vit k on 7/30.  - resume coumadin as an outpatient  CAD (coronary artery disease): -currently  stable. Not on antiplatelet agent as is on systemic anticoagulation with Coumadin. Continue metoprolol XL 50 daily, ramipril 2.5 daily .   Procedures:  I and D 7.31.2015  Consultations:  General surgery  Discharge Exam: Filed Vitals:   04/02/14 1319  BP: 132/84  Pulse: 80  Temp: 97.8 F (36.6 C)  Resp: 20    General: A&O x3 Cardiovascular: IRR Respiratory: good air movement CTA B/L  Discharge Instructions You were cared for by a hospitalist during your hospital stay. If you have any questions about your discharge medications or the care you received while you were in the hospital after you are discharged, you can call the unit and asked to speak with the hospitalist on call if the hospitalist that took care of you is not available. Once you are discharged, your primary care physician will handle any further medical issues. Please note that NO REFILLS for any discharge medications will be authorized once you are discharged, as it is imperative that you return to your primary care physician (or establish a relationship with a primary care physician if you do not have one) for your aftercare needs so that they can reassess your need for medications and monitor your lab values.      Discharge Instructions   Diet - low sodium heart healthy    Complete by:  As directed      Increase activity slowly    Complete by:  As directed  Medication List    STOP taking these medications       clindamycin 150 MG capsule  Commonly known as:  CLEOCIN     ibuprofen 200 MG tablet  Commonly known as:  ADVIL,MOTRIN      TAKE these medications       diltiazem 240 MG 24 hr capsule  Commonly known as:  CARDIZEM CD  Take 1 capsule (240 mg total) by mouth daily.     doxycycline 100 MG tablet  Commonly known as:  VIBRA-TABS  Take 1 tablet (100 mg total) by mouth every 12 (twelve) hours.     metoprolol succinate 50 MG 24 hr tablet  Commonly known as:  TOPROL-XL  Take 1  tablet (50 mg total) by mouth daily.     oxyCODONE-acetaminophen 5-325 MG per tablet  Commonly known as:  PERCOCET/ROXICET  Take 1 tablet by mouth every 4 (four) hours as needed for moderate pain or severe pain.     ramipril 2.5 MG capsule  Commonly known as:  ALTACE  Take 1 capsule (2.5 mg total) by mouth daily.     warfarin 7.5 MG tablet  Commonly known as:  COUMADIN  Take 7.5-10 mg by mouth See admin instructions. 7.5 mg on Sunday, Tuesday, Thursday, Saturday.  10 mg on Monday, Wednesday, Friday.       Allergies  Allergen Reactions  . Augmentin [Amoxicillin-Pot Clavulanate] Nausea And Vomiting   Follow-up Information   Follow up with [name], MD. Schedule an appointment as soon as possible for a visit on 04/15/2014. (As needed)    Specialty:  General Surgery   Contact information:   [address] [place] [address] [phone]        The results of significant diagnostics from this hospitalization (including imaging, microbiology, ancillary and laboratory) are listed below for reference.    Significant Diagnostic Studies: Dg Chest 2 View  04/01/2014   CLINICAL DATA:  Preop, hypertension  EXAM: CHEST  2 VIEW  COMPARISON:  01/25/2011  FINDINGS: Borderline cardiomegaly. Status post CABG. No acute infiltrate or pleural effusion. No pulmonary edema. Degenerative changes thoracic spine.  IMPRESSION: Borderline cardiomegaly.  Status post CABG.  No active disease.   Electronically Signed   By: Lahoma Crocker M.D.   On: 04/01/2014 09:29   Dg Foot Complete Right  03/30/2014   CLINICAL DATA:  Recent puncture wound, possible foreign body  EXAM: RIGHT FOOT COMPLETE - 3+ VIEW  COMPARISON:  None.  FINDINGS: No acute fracture or dislocation is noted. Diffuse vascular calcifications are noted. On the lateral projection there is a 4 mm radiopaque density identified projecting in the soft tissues just beyond the metatarsal-phalangeal articulation. This is not well appreciated on  the frontal or oblique images unlikely obscured by overlying structures.  IMPRESSION: No acute fracture is noted.  Apparent soft tissue foreign body, possible shard of glass lying in the ball of the foot just beyond the metatarsophalangeal articulation.   Electronically Signed   By: Inez Catalina M.D.   On: 03/30/2014 08:42    Microbiology: Recent Results (from the past 240 hour(s))  SURGICAL PCR SCREEN     Status: None   Collection Time    04/02/14  1:00 AM      Result Value Ref Range Status   MRSA, PCR NEGATIVE  NEGATIVE Final   Staphylococcus aureus NEGATIVE  NEGATIVE Final   Comment:            The Xpert SA Assay (FDA  approved for NASAL specimens     in patients over 9 years of age),     is one component of     a comprehensive surveillance     program.  Test performance has     been validated by [employer] for patients greater     than or equal to 68 year old.     It is not intended     to diagnose infection nor to     guide or monitor treatment.     Labs: Basic Metabolic Panel:  Recent Labs Lab 03/31/14 0640 04/01/14 0536 04/02/14 0611  NA 140 142 140  K 4.3 4.9 4.1  CL 106 106 102  CO2 20 26 24   GLUCOSE 115* 106* 118*  BUN 17 15 14   CREATININE 0.84 0.85 0.78  CALCIUM 8.8 9.1 9.2   Liver Function Tests:  Recent Labs Lab 04/01/14 0536  AST 46*  ALT 66*  ALKPHOS 116  BILITOT 1.5*  PROT 6.9  ALBUMIN 3.5   No results found for this basename: LIPASE, AMYLASE,  in the last 168 hours No results found for this basename: AMMONIA,  in the last 168 hours CBC:  Recent Labs Lab 03/31/14 0640 04/01/14 0536  WBC 10.0 9.6  NEUTROABS 6.9  --   HGB 13.6 12.9*  HCT 38.9* 38.1*  MCV 96.5 97.9  PLT 156 181   Cardiac Enzymes: No results found for this basename: CKTOTAL, CKMB, CKMBINDEX, TROPONINI,  in the last 168 hours BNP: BNP (last 3 results) No results found for this basename: PROBNP,  in the last 8760 hours CBG:  Recent Labs Lab  04/01/14 0718 04/02/14 0726  GLUCAP 98 117*       Signed:  FELIZ ORTIZ, ABRAHAM  [facility] 04/02/2014, 1:53 PM

## 2014-04-02 NOTE — Anesthesia Procedure Notes (Signed)
Procedure Name: LMA Insertion Date/Time: 04/02/2014 11:54 AM Performed by: Charmaine Downs Pre-anesthesia Checklist: Emergency Drugs available, Patient identified, Suction available and Patient being monitored Patient Re-evaluated:Patient Re-evaluated prior to inductionOxygen Delivery Method: Circle system utilized Preoxygenation: Pre-oxygenation with 100% oxygen Intubation Type: IV induction Ventilation: Mask ventilation without difficulty LMA: LMA inserted LMA Size: 4.0 Tube type: Oral Number of attempts: 1 Placement Confirmation: breath sounds checked- equal and bilateral and positive ETCO2 Tube secured with: Tape Dental Injury: Teeth and Oropharynx as per pre-operative assessment

## 2014-04-03 NOTE — Discharge Summary (Signed)
Physician Discharge Summary  Leonard Cisneros:[id number] DOB: 1940/10/21 DOA: 03/31/2014  PCP: Rubbie Battiest, MD  Admit date: 03/31/2014 Discharge date: 04/03/2014  Time spent: 35 minutes  Recommendations for Outpatient Follow-up:  1. Follow up with Surgery as an outpatient  Discharge Diagnoses:  Principal Problem:   Foreign body (FB) in soft tissue Active Problems:   High risk medication use   CAD (coronary artery disease)   Impaired glucose metabolism   Morbidly obese   Discharge Condition: stable  Diet recommendation: heart healthy  Filed Weights   03/31/14 0626 04/02/14 1015  Weight: 100.699 kg (222 lb) 100.699 kg (222 lb)    History of present illness:  73 y/o ? h/o Afib CHad2Vasc2 score=2 on coumadin, CAD s/p CABG2003, EF in 11/2011 45-50%, Nuc study 11/21/12 low risk, HLD, rotator cuff syndrome, tubular adenoma on colonoscopy 07/08/12. He was outside on 724/15 with his family having a meal and felt a twinge of pain and noted blood coming out of his right lower foot. He states that [facility] bandage on and sought medical care at his current care physician's office who gave him an antibiotic clindamycin 150 4 times a day to take to treat this. Because it was not improving an x-ray was performed yesterday showing soft tissue foreign body in the ball of the foot beyond the MTP articulation.  He denies any constitutional symptoms as below   Hospital Course:  Foreign body (FB) in soft tissue - started empirically on  vancomycin continue Cefzil for MSSA/group B strep coverage.  - General surgery to perform surgery to piece of glass. - de-escalated treatment to doxy. - pt want to go home i explained to him that it would benfit to be in house one more day but still wanted to go home despite risk.  Chronic atrial fibrillation: - CHad2Vasc2 score=3. INR 2.4. Given 2 U ffp as well as vit k on 7/30.  - resume coumadin as an outpatient  CAD (coronary artery disease): -currently stable.  Not on antiplatelet agent as is on systemic anticoagulation with Coumadin. Continue metoprolol XL 50 daily, ramipril 2.5 daily .   Procedures:  I and D 7.31.2015  Consultations:  General surgery  Discharge Exam: Filed Vitals:   04/02/14 1420  BP: 111/72  Pulse: 84  Temp: 97.9 F (36.6 C)  Resp: 20    General: A&O x3 Cardiovascular: IRR Respiratory: good air movement CTA B/L  Discharge Instructions You were cared for by a hospitalist during your hospital stay. If you have any questions about your discharge medications or the care you received while you were in the hospital after you are discharged, you can call the unit and asked to speak with the hospitalist on call if the hospitalist that took care of you is not available. Once you are discharged, your primary care physician will handle any further medical issues. Please note that NO REFILLS for any discharge medications will be authorized once you are discharged, as it is imperative that you return to your primary care physician (or establish a relationship with a primary care physician if you do not have one) for your aftercare needs so that they can reassess your need for medications and monitor your lab values.      Discharge Instructions   Diet - low sodium heart healthy    Complete by:  As directed      Increase activity slowly    Complete by:  As directed  Medication List    STOP taking these medications       clindamycin 150 MG capsule  Commonly known as:  CLEOCIN     ibuprofen 200 MG tablet  Commonly known as:  ADVIL,MOTRIN      TAKE these medications       diltiazem 240 MG 24 hr capsule  Commonly known as:  CARDIZEM CD  Take 1 capsule (240 mg total) by mouth daily.     doxycycline 100 MG tablet  Commonly known as:  VIBRA-TABS  Take 1 tablet (100 mg total) by mouth every 12 (twelve) hours.     metoprolol succinate 50 MG 24 hr tablet  Commonly known as:  TOPROL-XL  Take 1 tablet (50 mg  total) by mouth daily.     oxyCODONE-acetaminophen 5-325 MG per tablet  Commonly known as:  PERCOCET/ROXICET  Take 1 tablet by mouth every 4 (four) hours as needed for moderate pain or severe pain.     ramipril 2.5 MG capsule  Commonly known as:  ALTACE  Take 1 capsule (2.5 mg total) by mouth daily.     warfarin 7.5 MG tablet  Commonly known as:  COUMADIN  Take 7.5-10 mg by mouth See admin instructions. 7.5 mg on Sunday, Tuesday, Thursday, Saturday.  10 mg on Monday, Wednesday, Friday.       Allergies  Allergen Reactions  . Augmentin [Amoxicillin-Pot Clavulanate] Nausea And Vomiting   Follow-up Information   Follow up with [name], MD. Schedule an appointment as soon as possible for a visit on 04/15/2014. (As needed)    Specialty:  General Surgery   Contact information:   [address] [place] [address] [phone]        The results of significant diagnostics from this hospitalization (including imaging, microbiology, ancillary and laboratory) are listed below for reference.    Significant Diagnostic Studies: Dg Chest 2 View  04/01/2014   CLINICAL DATA:  Preop, hypertension  EXAM: CHEST  2 VIEW  COMPARISON:  01/25/2011  FINDINGS: Borderline cardiomegaly. Status post CABG. No acute infiltrate or pleural effusion. No pulmonary edema. Degenerative changes thoracic spine.  IMPRESSION: Borderline cardiomegaly.  Status post CABG.  No active disease.   Electronically Signed   By: Lahoma Crocker M.D.   On: 04/01/2014 09:29   Dg Foot Complete Right  03/30/2014   CLINICAL DATA:  Recent puncture wound, possible foreign body  EXAM: RIGHT FOOT COMPLETE - 3+ VIEW  COMPARISON:  None.  FINDINGS: No acute fracture or dislocation is noted. Diffuse vascular calcifications are noted. On the lateral projection there is a 4 mm radiopaque density identified projecting in the soft tissues just beyond the metatarsal-phalangeal articulation. This is not well appreciated on the frontal or  oblique images unlikely obscured by overlying structures.  IMPRESSION: No acute fracture is noted.  Apparent soft tissue foreign body, possible shard of glass lying in the ball of the foot just beyond the metatarsophalangeal articulation.   Electronically Signed   By: Inez Catalina M.D.   On: 03/30/2014 08:42    Microbiology: Recent Results (from the past 240 hour(s))  SURGICAL PCR SCREEN     Status: None   Collection Time    04/02/14  1:00 AM      Result Value Ref Range Status   MRSA, PCR NEGATIVE  NEGATIVE Final   Staphylococcus aureus NEGATIVE  NEGATIVE Final   Comment:            The Xpert SA Assay (FDA  approved for NASAL specimens     in patients over 69 years of age),     is one component of     a comprehensive surveillance     program.  Test performance has     been validated by [employer] for patients greater     than or equal to 58 year old.     It is not intended     to diagnose infection nor to     guide or monitor treatment.     Labs: Basic Metabolic Panel:  Recent Labs Lab 03/31/14 0640 04/01/14 0536 04/02/14 0611  NA 140 142 140  K 4.3 4.9 4.1  CL 106 106 102  CO2 20 26 24   GLUCOSE 115* 106* 118*  BUN 17 15 14   CREATININE 0.84 0.85 0.78  CALCIUM 8.8 9.1 9.2   Liver Function Tests:  Recent Labs Lab 04/01/14 0536  AST 46*  ALT 66*  ALKPHOS 116  BILITOT 1.5*  PROT 6.9  ALBUMIN 3.5   No results found for this basename: LIPASE, AMYLASE,  in the last 168 hours No results found for this basename: AMMONIA,  in the last 168 hours CBC:  Recent Labs Lab 03/31/14 0640 04/01/14 0536  WBC 10.0 9.6  NEUTROABS 6.9  --   HGB 13.6 12.9*  HCT 38.9* 38.1*  MCV 96.5 97.9  PLT 156 181   Cardiac Enzymes: No results found for this basename: CKTOTAL, CKMB, CKMBINDEX, TROPONINI,  in the last 168 hours BNP: BNP (last 3 results) No results found for this basename: PROBNP,  in the last 8760 hours CBG:  Recent Labs Lab 04/01/14 0718  04/02/14 0726  GLUCAP 98 117*       Signed:  FELIZ ORTIZ, Nikiya Starn  [facility] 04/03/2014, 7:26 AM

## 2014-04-04 ENCOUNTER — Encounter: Payer: Self-pay | Admitting: Family Medicine

## 2014-04-04 DIAGNOSIS — G64 Other disorders of peripheral nervous system: Secondary | ICD-10-CM | POA: Insufficient documentation

## 2014-04-04 DIAGNOSIS — G609 Hereditary and idiopathic neuropathy, unspecified: Secondary | ICD-10-CM | POA: Insufficient documentation

## 2014-04-04 DIAGNOSIS — G629 Polyneuropathy, unspecified: Secondary | ICD-10-CM

## 2014-04-04 HISTORY — DX: Polyneuropathy, unspecified: G62.9

## 2014-04-26 ENCOUNTER — Other Ambulatory Visit: Payer: Self-pay

## 2014-04-27 ENCOUNTER — Other Ambulatory Visit: Payer: Self-pay | Admitting: Pharmacist Clinician (PhC)/ Clinical Pharmacy Specialist

## 2014-04-27 MED ORDER — WARFARIN SODIUM 7.5 MG PO TABS: ORAL_TABLET | ORAL | Status: DC

## 2014-04-28 ENCOUNTER — Other Ambulatory Visit: Payer: Self-pay | Admitting: Pharmacist Clinician (PhC)/ Clinical Pharmacy Specialist

## 2014-04-29 LAB — PROTIME-INR
INR: 2.74 — ABNORMAL HIGH (ref <1.50)
Prothrombin Time: 29 seconds — ABNORMAL HIGH (ref 11.6–15.2)

## 2014-04-30 ENCOUNTER — Ambulatory Visit (INDEPENDENT_AMBULATORY_CARE_PROVIDER_SITE_OTHER): Payer: Medicare Other | Admitting: Pharmacist Clinician (PhC)/ Clinical Pharmacy Specialist

## 2014-04-30 DIAGNOSIS — Z7901 Long term (current) use of anticoagulants: Secondary | ICD-10-CM

## 2014-04-30 DIAGNOSIS — I4891 Unspecified atrial fibrillation: Secondary | ICD-10-CM

## 2014-06-25 ENCOUNTER — Other Ambulatory Visit: Payer: Self-pay | Admitting: Pharmacist Clinician (PhC)/ Clinical Pharmacy Specialist

## 2014-06-26 LAB — PROTIME-INR
INR: 3.27 — AB (ref <1.50)
Prothrombin Time: 33.3 seconds — ABNORMAL HIGH (ref 11.6–15.2)

## 2014-06-28 ENCOUNTER — Ambulatory Visit (INDEPENDENT_AMBULATORY_CARE_PROVIDER_SITE_OTHER): Payer: Medicare Other | Admitting: Pharmacist Clinician (PhC)/ Clinical Pharmacy Specialist

## 2014-06-28 DIAGNOSIS — I4891 Unspecified atrial fibrillation: Secondary | ICD-10-CM

## 2014-07-23 ENCOUNTER — Telehealth: Payer: Self-pay | Admitting: Cardiovascular Disease

## 2014-07-23 ENCOUNTER — Other Ambulatory Visit: Payer: Self-pay | Admitting: Pharmacist Clinician (PhC)/ Clinical Pharmacy Specialist

## 2014-07-23 NOTE — Telephone Encounter (Signed)
Ok to do vein puncture rather than finger stick for pt/inr

## 2014-07-24 LAB — PROTIME-INR
INR: 4.44 — ABNORMAL HIGH (ref <1.50)
Prothrombin Time: 42.3 seconds — ABNORMAL HIGH (ref 11.6–15.2)

## 2014-07-26 ENCOUNTER — Ambulatory Visit (INDEPENDENT_AMBULATORY_CARE_PROVIDER_SITE_OTHER): Payer: Medicare Other | Admitting: Pharmacist Clinician (PhC)/ Clinical Pharmacy Specialist

## 2014-07-26 DIAGNOSIS — I4891 Unspecified atrial fibrillation: Secondary | ICD-10-CM

## 2014-08-13 ENCOUNTER — Other Ambulatory Visit: Payer: Self-pay | Admitting: Cardiovascular Disease

## 2014-08-14 LAB — PROTIME-INR
INR: 2.92 — ABNORMAL HIGH
Prothrombin Time: 30.5 s — ABNORMAL HIGH (ref 11.6–15.2)

## 2014-08-17 ENCOUNTER — Ambulatory Visit (INDEPENDENT_AMBULATORY_CARE_PROVIDER_SITE_OTHER): Payer: Medicare Other | Admitting: Pharmacist Clinician (PhC)/ Clinical Pharmacy Specialist

## 2014-08-17 DIAGNOSIS — I4891 Unspecified atrial fibrillation: Secondary | ICD-10-CM

## 2014-09-21 ENCOUNTER — Other Ambulatory Visit: Payer: Self-pay | Admitting: Internal Medicine

## 2014-09-21 DIAGNOSIS — Z7901 Long term (current) use of anticoagulants: Secondary | ICD-10-CM | POA: Diagnosis not present

## 2014-09-21 DIAGNOSIS — Z86718 Personal history of other venous thrombosis and embolism: Secondary | ICD-10-CM | POA: Diagnosis not present

## 2014-09-21 LAB — PROTIME-INR
INR: 2.51 — AB (ref <1.50)
Prothrombin Time: 27.1 seconds — ABNORMAL HIGH (ref 11.6–15.2)

## 2014-09-23 ENCOUNTER — Ambulatory Visit (INDEPENDENT_AMBULATORY_CARE_PROVIDER_SITE_OTHER): Payer: Self-pay | Admitting: Pharmacist Clinician (PhC)/ Clinical Pharmacy Specialist

## 2014-09-23 DIAGNOSIS — I4891 Unspecified atrial fibrillation: Secondary | ICD-10-CM

## 2014-11-04 ENCOUNTER — Telehealth: Payer: Self-pay | Admitting: *Deleted

## 2014-11-04 NOTE — Telephone Encounter (Signed)
Faxed renewal for standing PT order

## 2014-11-22 DIAGNOSIS — D225 Melanocytic nevi of trunk: Secondary | ICD-10-CM | POA: Diagnosis not present

## 2014-11-22 DIAGNOSIS — Z08 Encounter for follow-up examination after completed treatment for malignant neoplasm: Secondary | ICD-10-CM | POA: Diagnosis not present

## 2014-11-22 DIAGNOSIS — L570 Actinic keratosis: Secondary | ICD-10-CM | POA: Diagnosis not present

## 2014-11-22 DIAGNOSIS — X32XXXD Exposure to sunlight, subsequent encounter: Secondary | ICD-10-CM | POA: Diagnosis not present

## 2014-11-22 DIAGNOSIS — Z85828 Personal history of other malignant neoplasm of skin: Secondary | ICD-10-CM | POA: Diagnosis not present

## 2014-12-09 ENCOUNTER — Other Ambulatory Visit: Payer: Self-pay | Admitting: Internal Medicine

## 2014-12-09 DIAGNOSIS — Z86718 Personal history of other venous thrombosis and embolism: Secondary | ICD-10-CM | POA: Diagnosis not present

## 2014-12-09 DIAGNOSIS — Z7901 Long term (current) use of anticoagulants: Secondary | ICD-10-CM | POA: Diagnosis not present

## 2014-12-10 ENCOUNTER — Ambulatory Visit (INDEPENDENT_AMBULATORY_CARE_PROVIDER_SITE_OTHER): Payer: Medicare Other | Admitting: Pharmacist Clinician (PhC)/ Clinical Pharmacy Specialist

## 2014-12-10 DIAGNOSIS — I4891 Unspecified atrial fibrillation: Secondary | ICD-10-CM

## 2014-12-10 LAB — PROTIME-INR
INR: 2.96 — ABNORMAL HIGH (ref <1.50)
Prothrombin Time: 30.8 seconds — ABNORMAL HIGH (ref 11.6–15.2)

## 2015-01-03 ENCOUNTER — Other Ambulatory Visit: Payer: Self-pay

## 2015-01-03 ENCOUNTER — Other Ambulatory Visit: Payer: Self-pay | Admitting: Pharmacist Clinician (PhC)/ Clinical Pharmacy Specialist

## 2015-01-03 MED ORDER — RAMIPRIL 2.5 MG PO CAPS: 2.5000 mg | ORAL_CAPSULE | Freq: Every day | ORAL | Status: DC

## 2015-01-03 MED ORDER — WARFARIN SODIUM 7.5 MG PO TABS: ORAL_TABLET | ORAL | Status: DC

## 2015-01-03 MED ORDER — DILTIAZEM HCL ER COATED BEADS 240 MG PO CP24
240.0000 mg | ORAL_CAPSULE | Freq: Every day | ORAL | Status: DC
Start: 2015-01-03 — End: 2015-01-26

## 2015-01-03 NOTE — Telephone Encounter (Signed)
Rx(s) sent to pharmacy electronically.  

## 2015-01-12 ENCOUNTER — Telehealth: Payer: Self-pay | Admitting: Cardiovascular Disease

## 2015-01-12 DIAGNOSIS — Z7901 Long term (current) use of anticoagulants: Secondary | ICD-10-CM

## 2015-01-12 NOTE — Telephone Encounter (Signed)
Leonard Cisneros is calling because the [facility] lab is no longer in [place] and he needs a fax order to [facility] at [facility] for his standing coumadin appointments . Please call if you have any questions .[name] Thanks

## 2015-01-12 NOTE — Telephone Encounter (Signed)
Orders entered for protime INR for [facility].

## 2015-01-18 ENCOUNTER — Telehealth: Payer: Self-pay | Admitting: Cardiovascular Disease

## 2015-01-18 ENCOUNTER — Other Ambulatory Visit: Payer: Self-pay | Admitting: Cardiovascular Disease

## 2015-01-18 DIAGNOSIS — Z7901 Long term (current) use of anticoagulants: Secondary | ICD-10-CM | POA: Diagnosis not present

## 2015-01-18 NOTE — Telephone Encounter (Signed)
Leonard Cisneros needs standing PT/INR order. Informed Lonn Georgia that order was placed. Requisition print out faxed to [phone]

## 2015-01-19 LAB — PROTIME-INR
INR: 2.7 — ABNORMAL HIGH (ref 0.8–1.2)
Prothrombin Time: 28.3 s — ABNORMAL HIGH (ref 9.1–12.0)

## 2015-01-26 ENCOUNTER — Other Ambulatory Visit: Payer: Self-pay | Admitting: *Deleted

## 2015-01-26 MED ORDER — METOPROLOL SUCCINATE ER 50 MG PO TB24: 50.0000 mg | ORAL_TABLET | Freq: Every day | ORAL | Status: DC

## 2015-01-26 MED ORDER — DILTIAZEM HCL ER COATED BEADS 240 MG PO CP24: 240.0000 mg | ORAL_CAPSULE | Freq: Every day | ORAL | Status: DC

## 2015-01-26 NOTE — Telephone Encounter (Signed)
Metoprolol and Diltiazem refilled electronically #20 only - needs appt for future refills.

## 2015-02-21 ENCOUNTER — Other Ambulatory Visit: Payer: Self-pay | Admitting: *Deleted

## 2015-02-21 MED ORDER — METOPROLOL SUCCINATE ER 50 MG PO TB24: 50.0000 mg | ORAL_TABLET | Freq: Every day | ORAL | Status: DC

## 2015-02-21 MED ORDER — DILTIAZEM HCL ER COATED BEADS 240 MG PO CP24: 240.0000 mg | ORAL_CAPSULE | Freq: Every day | ORAL | Status: DC

## 2015-02-21 NOTE — Telephone Encounter (Signed)
ERX sent to pharmacy 

## 2015-02-23 ENCOUNTER — Other Ambulatory Visit: Payer: Self-pay | Admitting: *Deleted

## 2015-02-23 MED ORDER — RAMIPRIL 2.5 MG PO CAPS: 2.5000 mg | ORAL_CAPSULE | Freq: Every day | ORAL | Status: DC

## 2015-03-22 ENCOUNTER — Telehealth: Payer: Self-pay | Admitting: Cardiovascular Disease

## 2015-03-22 ENCOUNTER — Other Ambulatory Visit: Payer: Self-pay | Admitting: Cardiovascular Disease

## 2015-03-22 DIAGNOSIS — Z7901 Long term (current) use of anticoagulants: Secondary | ICD-10-CM | POA: Diagnosis not present

## 2015-03-22 LAB — COAGUCHEK XS/INR WAIVED
INR: 2.2 — AB (ref 0.9–1.1)
PROTHROMBIN TIME: 26.9 s

## 2015-03-22 NOTE — Telephone Encounter (Signed)
protime standing order faxed to [phone].

## 2015-03-23 ENCOUNTER — Other Ambulatory Visit: Payer: Self-pay | Admitting: *Deleted

## 2015-03-23 ENCOUNTER — Ambulatory Visit (INDEPENDENT_AMBULATORY_CARE_PROVIDER_SITE_OTHER): Payer: Medicare Other | Admitting: Pharmacist Clinician (PhC)/ Clinical Pharmacy Specialist

## 2015-03-23 DIAGNOSIS — I4891 Unspecified atrial fibrillation: Secondary | ICD-10-CM | POA: Diagnosis not present

## 2015-03-23 MED ORDER — RAMIPRIL 2.5 MG PO CAPS: 2.5000 mg | ORAL_CAPSULE | Freq: Every day | ORAL | Status: DC

## 2015-04-18 ENCOUNTER — Ambulatory Visit (INDEPENDENT_AMBULATORY_CARE_PROVIDER_SITE_OTHER): Payer: Medicare Other | Admitting: Cardiovascular Disease

## 2015-04-18 VITALS — BP 132/64 | HR 67 | Ht 72.0 in | Wt 223.8 lb

## 2015-04-18 DIAGNOSIS — I25810 Atherosclerosis of coronary artery bypass graft(s) without angina pectoris: Secondary | ICD-10-CM

## 2015-04-18 DIAGNOSIS — Z79899 Other long term (current) drug therapy: Secondary | ICD-10-CM

## 2015-04-18 DIAGNOSIS — E669 Obesity, unspecified: Secondary | ICD-10-CM | POA: Diagnosis not present

## 2015-04-18 DIAGNOSIS — I4891 Unspecified atrial fibrillation: Secondary | ICD-10-CM

## 2015-04-18 DIAGNOSIS — I10 Essential (primary) hypertension: Secondary | ICD-10-CM

## 2015-04-18 NOTE — Patient Instructions (Signed)
Your physician has requested that you have en exercise stress myoview. For further information please visit [email or web address]. Please follow instruction sheet, as given. This will be scheduled for March 2017.  Your physician recommends that you return for lab work fasting.  Your physician recommends that you schedule a follow-up appointment in: March 2017.

## 2015-04-20 ENCOUNTER — Other Ambulatory Visit: Payer: Self-pay | Admitting: Cardiovascular Disease

## 2015-04-20 ENCOUNTER — Encounter: Payer: Self-pay | Admitting: Cardiovascular Disease

## 2015-04-20 DIAGNOSIS — I10 Essential (primary) hypertension: Secondary | ICD-10-CM | POA: Insufficient documentation

## 2015-04-20 DIAGNOSIS — I25810 Atherosclerosis of coronary artery bypass graft(s) without angina pectoris: Secondary | ICD-10-CM | POA: Diagnosis not present

## 2015-04-20 DIAGNOSIS — I4891 Unspecified atrial fibrillation: Secondary | ICD-10-CM | POA: Diagnosis not present

## 2015-04-20 DIAGNOSIS — E669 Obesity, unspecified: Secondary | ICD-10-CM | POA: Insufficient documentation

## 2015-04-20 DIAGNOSIS — Z79899 Other long term (current) drug therapy: Secondary | ICD-10-CM | POA: Diagnosis not present

## 2015-04-20 NOTE — Progress Notes (Signed)
Patient ID: DRAE MITZEL, male   DOB: 11/14/1940, 74 y.o.   MRN: [id number]     HPI: Leonard Cisneros is a 74 y.o. male who presents to the office today for a 15 month follow up cardiology evaluation.  Leonard Cisneros has established CAD and in 2003 underwent CABG surgery by Dr. Roxy Manns with LIMA to the LAD, RIMA to the distal RCA, and vein graft to his ramus intermediate vessel.  Initially, he did have postoperative atrial fibrillation, which was controlled with Rythmol.  He is status post atrial flutter ablation by Dr. [name].  Over the last several years he has been in permanent atrial fibrillation and has been on warfarin anticoagulation.  His last nuclear perfusion study in March 2014 was essentially unchanged from February 2012 and did not reveal significant scar or ischemia.  He has a history of her left hip surgery with revision.  He exercises regularly, typically 6 days per week.  He walks for at least 35-45 minutes.  He denies any associated chest pressure.  He is unaware of fast heartbeats.  He denies presyncope or syncope.  He is unaware of any rapid heartbeat.  He denies presyncope or syncope.  He presents for follow-up evaluation.  Past Medical History  Diagnosis Date  . Atrial fibrillation     chronic coumadin  . CAD (coronary artery disease)   . CHF (congestive heart failure)   . Hypertension   . Dysrhythmia     afib  . Arthritis     knee left  . Unspecified hereditary and idiopathic peripheral neuropathy 04/04/2014    Past Surgical History  Procedure Laterality Date  . Colonoscopy  06/04/2002    Normal colon and rectum  . Hip surgery      x4  both hips  . Back surgery    . Rotator cuff repair    . Coronary artery bypass graft  2003    LIMA to LAD,LIMA to distal RCA,SVG to ramus intermediate vessel.  . 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 Colonoscopy  07/08/2012    Procedure: COLONOSCOPY;   Surgeon: [name], MD;  Location: [facility];  Service: Endoscopy;  Laterality: N/A;  11:30  . US echocardiography  11/13/2011    LV mildly dilated,mild concentric LVH,LA mod - severely dilated,RA mildly dilated,mild to mod. 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  . Foreign body removal Right 04/02/2014    Procedure: FOREIGN BODY REMOVAL RIGHT FOOT;  Surgeon: [name], MD;  Location: [facility] ORS;  Service: General;  Laterality: Right;    Allergies  Allergen Reactions  . Augmentin [Amoxicillin-Pot Clavulanate] Nausea And Vomiting    Current Outpatient Prescriptions  Medication Sig Dispense Refill  . diltiazem (CARDIZEM CD) 240 MG 24 hr capsule Take 1 capsule (240 mg total) by mouth daily. Keep September appt 30 capsule 2  . doxycycline (VIBRA-TABS) 100 MG tablet Take 1 tablet (100 mg total) by mouth every 12 (twelve) hours. 20 tablet 0  . metoprolol succinate (TOPROL-XL) 50 MG 24 hr tablet Take 1 tablet (50 mg total) by mouth daily. Keep September appt 30 tablet 2  . oxyCODONE-acetaminophen (PERCOCET/ROXICET) 5-325 MG per tablet Take 1 tablet by mouth every 4 (four) hours as needed for moderate pain or severe pain.    . ramipril (ALTACE) 2.5 MG capsule Take 1 capsule (2.5 mg  total) by mouth daily. KEEP OV. 30 capsule 1  . warfarin (COUMADIN) 7.5 MG tablet TAKE 1 TO 1.5 TABLETS BY MOUTH DAILY AS DIRECTED BY COUMADIN CLINIC 40 tablet 5   No current facility-administered medications for this visit.    Social History   Social History  . Marital Status: Married    Spouse Name: N/A  . Number of Children: 2  . Years of Education: N/A   Occupational History  . 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 Smoking status: Former Smoker -- 0.50 packs/day for 2 years    Types: Cigarettes    Quit date: 09/03/1966  . Smokeless tobacco: Not on file     Comment: Quit about 40 years ago  . Alcohol Use: Yes      Comment: a glass of wine with dinner/ beer on the weekend  . Drug Use: No  . Sexual Activity: Not on file   Other Topics Concern  . Not on file   Social History Narrative    Family History  Problem Relation Age of Onset  . Colon cancer Neg Hx   . Heart attack Brother     ROS General: Negative; No fevers, chills, or night sweats HEENT: Negative; No changes in vision or hearing, sinus congestion, difficulty swallowing Pulmonary: Negative; No cough, wheezing, shortness of breath, hemoptysis Cardiovascular: As per history of present illness, positive for CABG revascularization surgery, positive for atrial flutter ablation, positive for permanent atrial fibrillation; No chest pain, presyncope, syncope, palpatations GI: Negative; No nausea, vomiting, diarrhea, or abdominal pain GU: Negative; No dysuria, hematuria, or difficulty voiding Musculoskeletal: Positive for left hip surgery with revision; no myalgias, joint pain, or weakness Hematologic: Negative; no easy bruising, bleeding Endocrine: Negative; no heat/cold intolerance; no diabetes Neuro: Negative; no changes in balance, headaches Skin: Negative; No rashes or skin lesions Psychiatric: Negative; No behavioral problems, depression Sleep: Negative; No snoring,  daytime sleepiness, hypersomnolence, bruxism, restless legs, hypnogognic hallucinations. Other comprehensive 14 point system review is negative     Physical Exam BP 132/64 mmHg  Pulse 67  Ht 6' (1.829 m)  Wt 223 lb 12.8 oz (101.515 kg)  BMI 30.35 kg/m2  Wt Readings from Last 3 Encounters:  04/18/15 223 lb 12.8 oz (101.515 kg)  04/02/14 222 lb (100.699 kg)  03/29/14 223 lb 6.4 oz (101.334 kg)   General: Alert, oriented, no distress.  Skin: normal turgor, no rashes, warm and dry HEENT: Normocephalic, atraumatic. Pupils equal round and reactive to light; sclera anicteric; extraocular muscles intact, No lid lag; Nose without nasal septal hypertrophy; Mouth/Parynx  benign; Mallinpatti scale 2 Neck: No JVD, no carotid bruits; normal carotid upstroke Lungs: clear to ausculatation and percussion bilaterally; no wheezing or rales, normal inspiratory and expiratory effort Chest wall: without tenderness to palpitation Heart: PMI not displaced, irregularly irregular rhythm with a controlled ventricular rate in the 60s, s1 s2 normal, 1/6systolic murmur, No diastolic murmur, no rubs, gallops, thrills, or heaves Abdomen: soft, nontender; no hepatosplenomehaly, BS+; abdominal aorta nontender and not dilated by palpation. Back: no CVA tenderness Pulses: 2+  Musculoskeletal: full range of motion, normal strength, no joint deformities Extremities: Pulses 2+, no clubbing cyanosis or edema, Homan's sign negative  Neurologic: grossly nonfocal; Cranial nerves grossly wnl Psychologic: Normal mood and affect  ECG (independently read by me): Atrial fibrillation at 67 bpm.  No significant ST-T changes.  May 2015 ECG (independently read by me): Atrial fibrillation at 67 beats per minute.  Rate control.  QTc interval 420 ms.  LABS:  BMP Latest Ref Rng 04/02/2014 04/01/2014 03/31/2014  Glucose 70 - 99 mg/dL 118(H) 106(H) 115(H)  BUN 6 - 23 mg/dL _0 Creatinine 0.50 - 1.35 mg/dL 0.78 0.85 0.84  Sodium 137 - 147 mEq/L 140 142 140  Potassium 3.7 - 5.3 mEq/L 4.1 4.9 4.3  Chloride 96 - 112 mEq/L 102 106 106  CO2 19 - 32 mEq/L _1 Calcium 8.4 - 10.5 mg/dL 9.2 9.1 8.8   Hepatic Function Latest Ref Rng 04/01/2014 01/13/2014 11/28/2010  Total Protein 6.0 - 8.3 g/dL 6.9 6.5 7.5  Albumin 3.5 - 5.2 g/dL 3.5 4.3 4.4  AST 0 - 37 U/L 46(H) 20 23  ALT 0 - 53 U/L 66(H) 22 28  Alk Phosphatase 39 - 117 U/L 116 55 75  Total Bilirubin 0.3 - 1.2 mg/dL 1.5(H) 0.7 1.1   CBC Latest Ref Rng 04/01/2014 03/31/2014 01/13/2014  WBC 4.0 - 10.5 K/uL 9.6 10.0 5.1  Hemoglobin 13.0 - 17.0 g/dL 12.9(L) 13.6 14.2  Hematocrit 39.0 - 52.0 % 38.1(L) 38.9(L) 40.5  Platelets 150 - 400 K/uL 181 156  185   Lab Results  Component Value Date   MCV 97.9 04/01/2014   MCV 96.5 03/31/2014   MCV 94.2 01/13/2014   Lab Results  Component Value Date   TSH 1.901 01/13/2014   Lab Results  Component Value Date   HGBA1C 5.5 03/29/2014   Lipid Panel     Component Value Date/Time   CHOL 163 01/13/2014 0737   TRIG 84 01/13/2014 0737   HDL 59 01/13/2014 0737   CHOLHDL 2.8 01/13/2014 0737   VLDL 17 01/13/2014 0737   LDLCALC 87 01/13/2014 0737    RADIOLOGY: No results found.    ASSESSMENT AND PLAN: Leonard Cisneros is a 74 year old WM who is is 13 years status post CABG revascularization surgery by Dr. Roxy Manns and has bilateral arterial conduits to his LAD and RCA, as well as a vein graft to the intermediate vessel.  He is in permanent atrial fibrillation and is on chronic Coumadin therapy.  He has a history of hypertension and his blood pressure today is stable on ramipril2.5 mg, Toprol-XL 50 mg and Cardizem CD 240 mg daily.  He is on Coumadin for anticoagulation and denies any bleeding.  I am recommending a complete set of blood work.  He is not on any lipid lowering therapy.  Target LDL is less than 70.  We discussed continued exercise.  His body mass index is 30.35 compatible with mild obesity and weight reduction was recommended.  His atrial fibrillation rate is controlled.  In March 2017.  I'm scheduling him for a 3-year follow-up nuclear perfusion study to assess his coronary perfusion/graft patency.  I will see him in the office for follow-up evaluation.   [name], MD, [facility]  04/20/2015 5:20 PM

## 2015-04-21 LAB — CBC WITH DIFFERENTIAL/PLATELET
BASOS: 0 %
Basophils Absolute: 0 10*3/uL (ref 0.0–0.2)
EOS (ABSOLUTE): 0.6 10*3/uL — ABNORMAL HIGH (ref 0.0–0.4)
EOS: 8 %
Hematocrit: 41.9 % (ref 37.5–51.0)
Hemoglobin: 15 g/dL (ref 12.6–17.7)
LYMPHS ABS: 1.7 10*3/uL (ref 0.7–3.1)
Lymphs: 24 %
MCH: 33.6 pg — AB (ref 26.6–33.0)
MCHC: 35.8 g/dL — AB (ref 31.5–35.7)
MCV: 94 fL (ref 79–97)
MONOS ABS: 0.8 10*3/uL (ref 0.1–0.9)
Monocytes: 12 %
NEUTROS ABS: 3.9 10*3/uL (ref 1.4–7.0)
Neutrophils: 56 %
PLATELETS: 192 10*3/uL (ref 150–379)
RBC: 4.46 x10E6/uL (ref 4.14–5.80)
RDW: 13.6 % (ref 12.3–15.4)
WBC: 7 10*3/uL (ref 3.4–10.8)

## 2015-04-21 LAB — LIPID PANEL W/O CHOL/HDL RATIO
CHOLESTEROL TOTAL: 208 mg/dL — AB (ref 100–199)
HDL: 88 mg/dL (ref >39)
LDL Calculated: 109 mg/dL — ABNORMAL HIGH (ref 0–99)
Triglycerides: 54 mg/dL (ref 0–149)
VLDL CHOLESTEROL CAL: 11 mg/dL (ref 5–40)

## 2015-04-21 LAB — COMPREHENSIVE METABOLIC PANEL
A/G RATIO: 2 (ref 1.1–2.5)
ALK PHOS: 95 IU/L (ref 39–117)
ALT: 22 IU/L (ref 0–44)
AST: 22 IU/L (ref 0–40)
Albumin: 4.5 g/dL (ref 3.5–4.8)
BILIRUBIN TOTAL: 0.5 mg/dL (ref 0.0–1.2)
BUN / CREAT RATIO: 21 (ref 10–22)
BUN: 19 mg/dL (ref 8–27)
CHLORIDE: 108 mmol/L (ref 97–108)
CO2: 24 mmol/L (ref 18–29)
Calcium: 9.2 mg/dL (ref 8.6–10.2)
Creatinine, Ser: 0.91 mg/dL (ref 0.76–1.27)
GFR calc Af Amer: 96 mL/min/{1.73_m2} (ref >59)
GFR calc non Af Amer: 83 mL/min/{1.73_m2} (ref >59)
GLUCOSE: 97 mg/dL (ref 65–99)
Globulin, Total: 2.2 g/dL (ref 1.5–4.5)
POTASSIUM: 4.4 mmol/L (ref 3.5–5.2)
Sodium: 143 mmol/L (ref 134–144)
Total Protein: 6.7 g/dL (ref 6.0–8.5)

## 2015-04-21 LAB — COAGUCHEK XS/INR WAIVED
INR: 3.4 — ABNORMAL HIGH (ref 0.9–1.1)
Prothrombin Time: 40.8 s

## 2015-04-21 LAB — TSH: TSH: 2.22 u[IU]/mL (ref 0.450–4.500)

## 2015-04-25 ENCOUNTER — Ambulatory Visit (INDEPENDENT_AMBULATORY_CARE_PROVIDER_SITE_OTHER): Payer: Medicare Other | Admitting: Pharmacist Clinician (PhC)/ Clinical Pharmacy Specialist

## 2015-04-25 ENCOUNTER — Other Ambulatory Visit: Payer: Self-pay | Admitting: *Deleted

## 2015-04-25 DIAGNOSIS — I4891 Unspecified atrial fibrillation: Secondary | ICD-10-CM

## 2015-04-25 MED ORDER — RAMIPRIL 2.5 MG PO CAPS
2.5000 mg | ORAL_CAPSULE | Freq: Every day | ORAL | Status: DC
Start: 2015-04-25 — End: 2015-05-24

## 2015-04-26 ENCOUNTER — Telehealth: Payer: Self-pay | Admitting: *Deleted

## 2015-04-26 NOTE — Telephone Encounter (Signed)
-----   Message from [name], MD sent at 04/25/2015  1:40 PM EDT ----- Labs good x pt is s/p cabg; if can take atorvastatin start 20 mg for LDL 109; INR to [facility]

## 2015-04-26 NOTE — Telephone Encounter (Signed)
Patient returned a call to me. Lab results and recommendations were given. Patient states that he was taken off of atorvastatin a few years back due to some type of problem. He does not remember why. Message will be sent to Dr. Claiborne Billings for further recommendations.

## 2015-04-26 NOTE — Telephone Encounter (Signed)
-----   Message from [name], MD sent at 04/25/2015  1:40 PM EDT ----- Labs good x pt is s/p cabg; if can take atorvastatin start 20 mg for LDL 109; INR to [facility]

## 2015-04-28 NOTE — Telephone Encounter (Signed)
If can't take a statin then try zetia 10 mg

## 2015-05-04 ENCOUNTER — Other Ambulatory Visit: Payer: Self-pay | Admitting: *Deleted

## 2015-05-04 DIAGNOSIS — Z79899 Other long term (current) drug therapy: Secondary | ICD-10-CM

## 2015-05-04 DIAGNOSIS — E785 Hyperlipidemia, unspecified: Secondary | ICD-10-CM

## 2015-05-04 MED ORDER — EZETIMIBE 10 MG PO TABS: 10.0000 mg | ORAL_TABLET | Freq: Every day | ORAL | Status: DC

## 2015-05-04 NOTE — Telephone Encounter (Signed)
Informed patient Dr. Claiborne Billings would like for him to try zetia. Patient is agreeable. Prescription sent to [facility] in [place]. Lab order sent to patient per protocol to have follow up blood work in 8 weeks.

## 2015-05-13 ENCOUNTER — Other Ambulatory Visit: Payer: Self-pay | Admitting: Cardiovascular Disease

## 2015-05-13 NOTE — Telephone Encounter (Signed)
Rx request sent to pharmacy.  

## 2015-05-20 ENCOUNTER — Ambulatory Visit: Payer: Medicare Other | Admitting: Cardiovascular Disease

## 2015-05-24 ENCOUNTER — Other Ambulatory Visit: Payer: Self-pay | Admitting: Cardiovascular Disease

## 2015-05-24 NOTE — Telephone Encounter (Signed)
Rx request sent to pharmacy.  

## 2015-05-26 ENCOUNTER — Other Ambulatory Visit: Payer: Self-pay | Admitting: Cardiovascular Disease

## 2015-05-26 DIAGNOSIS — Z7901 Long term (current) use of anticoagulants: Secondary | ICD-10-CM | POA: Diagnosis not present

## 2015-05-26 LAB — COAGUCHEK XS/INR WAIVED
INR: 3.4 — AB (ref 0.9–1.1)
PROTHROMBIN TIME: 40.4 s

## 2015-06-02 ENCOUNTER — Ambulatory Visit (INDEPENDENT_AMBULATORY_CARE_PROVIDER_SITE_OTHER): Payer: Medicare Other | Admitting: Pharmacist Clinician (PhC)/ Clinical Pharmacy Specialist

## 2015-06-02 DIAGNOSIS — I4891 Unspecified atrial fibrillation: Secondary | ICD-10-CM

## 2015-06-13 ENCOUNTER — Encounter: Payer: Self-pay | Admitting: Family Medicine

## 2015-06-13 ENCOUNTER — Ambulatory Visit (INDEPENDENT_AMBULATORY_CARE_PROVIDER_SITE_OTHER): Payer: Medicare Other | Admitting: Family Medicine

## 2015-06-13 VITALS — BP 138/82 | Ht 73.0 in | Wt 224.2 lb

## 2015-06-13 DIAGNOSIS — M21371 Foot drop, right foot: Secondary | ICD-10-CM | POA: Diagnosis not present

## 2015-06-13 DIAGNOSIS — M792 Neuralgia and neuritis, unspecified: Secondary | ICD-10-CM | POA: Diagnosis not present

## 2015-06-13 NOTE — Progress Notes (Signed)
   Subjective:    Patient ID: Leonard Cisneros, male    DOB: [birth year], 74 y.o.   MRN: [id number]  Foot Pain This is a recurrent problem. The current episode started more than 1 year ago. The problem occurs intermittently. Nothing aggravates the symptoms.   Patient in today for pain to right great toe. Going on al ong time, year or so ish  Sudden quick shock like feeling in th4e big toe  Sudden painful shock like feeling  Patient saw an advertisement for peripheral arterial disease at his cardiologist's office. Concerned that he may have this.  Patient states pain is like an electric shock that last about 2 seconds. Pain re occurs within a few moments.  Had back surgery many years ago. Involved fairly severe neuropathic pain into the right leg. Notes has had numbness since. Also sense of weakness at times. In addition has now developed this progressive pain.  Patient states no other concerns this visit.   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 Along with a spinal etiology status post surgery with some residual weakness discussed at length patient wishes to hold off on major workup now 25 minutes spent most in discussion offer medication patient also wishes to hold off WSL

## 2015-06-27 ENCOUNTER — Other Ambulatory Visit: Payer: Self-pay | Admitting: Cardiovascular Disease

## 2015-06-27 DIAGNOSIS — Z7901 Long term (current) use of anticoagulants: Secondary | ICD-10-CM | POA: Diagnosis not present

## 2015-06-27 LAB — COAGUCHEK XS/INR WAIVED
INR: 2.7 — ABNORMAL HIGH (ref 0.9–1.1)
PROTHROMBIN TIME: 32.8 s

## 2015-06-29 ENCOUNTER — Ambulatory Visit (INDEPENDENT_AMBULATORY_CARE_PROVIDER_SITE_OTHER): Payer: Medicare Other | Admitting: Pharmacist Clinician (PhC)/ Clinical Pharmacy Specialist

## 2015-06-29 DIAGNOSIS — Z7901 Long term (current) use of anticoagulants: Secondary | ICD-10-CM

## 2015-06-29 DIAGNOSIS — I4891 Unspecified atrial fibrillation: Secondary | ICD-10-CM

## 2015-07-05 ENCOUNTER — Encounter: Payer: Self-pay | Admitting: Family Medicine

## 2015-07-05 ENCOUNTER — Ambulatory Visit (INDEPENDENT_AMBULATORY_CARE_PROVIDER_SITE_OTHER): Payer: Medicare Other | Admitting: Family Medicine

## 2015-07-05 VITALS — BP 110/70 | Ht 71.5 in | Wt 224.0 lb

## 2015-07-05 DIAGNOSIS — Z23 Encounter for immunization: Secondary | ICD-10-CM

## 2015-07-05 DIAGNOSIS — Z0000 Encounter for general adult medical examination without abnormal findings: Secondary | ICD-10-CM

## 2015-07-05 DIAGNOSIS — Z125 Encounter for screening for malignant neoplasm of prostate: Secondary | ICD-10-CM

## 2015-07-05 DIAGNOSIS — M792 Neuralgia and neuritis, unspecified: Secondary | ICD-10-CM

## 2015-07-05 NOTE — Progress Notes (Signed)
Subjective:    Patient ID: Leonard Cisneros, male    DOB: Jan 12, 1941, 74 y.o.   MRN: [id number]  HPI AWV- Annual Wellness Visit  The patient was seen for their annual wellness visit. The patient's past medical history, surgical history, and family history were reviewed. Pertinent vaccines were reviewed ( tetanus, pneumonia, shingles, flu) The patient's medication list was reviewed and updated.  The height and weight were entered. The patient's current BMI is: 30.81  Cognitive screening was completed. Outcome of Mini - Cog: passed  Falls within the past 6 months: none  Current tobacco usage: non smoker (All patients who use tobacco were given written and verbal information on quitting)  Recent listing of emergency department/hospitalizations over the past year were reviewed.  current specialist the patient sees on a regular basis: cardiology   Medicare annual wellness visit patient questionnaire was reviewed.  A written screening schedule for the patient for the next 5-10 years was given. Appropriate discussion of followup regarding next visit was discussed.  Last colonoscopy- 2 years ago, normal. Pt due in two years  Pneumonia shot due, will give today  Already had flu shot   Walks every day 35 to 40 minutres  Sees card once per yr,  warchin diet well   Diet good good variety    Patient notes ongoing back pain low back in nature. Also hip pain also numbness into legs. Wonders if he needs to see a neurosurgeon again. Please see prior note.  Also fitness centr three times per wk fo r45 min   Patient has no new concerns at this time.    Review of Systems  Constitutional: Negative for fever, activity change and appetite change.  HENT: Negative for congestion and rhinorrhea.   Eyes: Negative for discharge.  Respiratory: Negative for cough and wheezing.   Cardiovascular: Negative for chest pain.  Gastrointestinal: Negative for vomiting, abdominal pain and blood in  stool.  Genitourinary: Negative for frequency and difficulty urinating.  Musculoskeletal: Negative for neck pain.  Skin: Negative for rash.  Allergic/Immunologic: Negative for environmental allergies and food allergies.  Neurological: Negative for weakness and headaches.  Psychiatric/Behavioral: Negative for agitation.  All other systems reviewed and are negative.      Objective:   Physical Exam  Constitutional: He appears well-developed and well-nourished.  HENT:  Head: Normocephalic and atraumatic.  Right Ear: External ear normal.  Left Ear: External ear normal.  Nose: Nose normal.  Mouth/Throat: Oropharynx is clear and moist.  Eyes: EOM are normal. Pupils are equal, round, and reactive to light.  Neck: Normal range of motion. Neck supple. No thyromegaly present.  Cardiovascular: Normal rate, regular rhythm and normal heart sounds.   No murmur heard. Pulmonary/Chest: Effort normal and breath sounds normal. No respiratory distress. He has no wheezes.  Abdominal: Soft. Bowel sounds are normal. He exhibits no distension and no mass. There is no tenderness.  Genitourinary: Penis normal.  Musculoskeletal: Normal range of motion. He exhibits no edema.  Lymphadenopathy:    He has no cervical adenopathy.  Neurological: He is alert. He exhibits normal muscle tone.  Skin: Skin is warm and dry. No erythema.  Psychiatric: He has a normal mood and affect. His behavior is normal. Judgment normal.  Vitals reviewed.   Negative straight leg raise, positive pain percussion low back. Some crepitations of knees. See prior neurological result      Assessment & Plan:  Impression 1 wellness exam #2 chronic back leg pain. Not clear if this  is true sciatica discussed. Neurosurgery referral would require an MRI. This is discussed also. After discussion patient elects to maintain same approach for now. Pneumonia shot today. Flu shot Ivor given. Appropriate blood work. Diet exercise discussed WSL

## 2015-07-08 ENCOUNTER — Other Ambulatory Visit: Payer: Self-pay | Admitting: Cardiovascular Disease

## 2015-07-08 DIAGNOSIS — Z125 Encounter for screening for malignant neoplasm of prostate: Secondary | ICD-10-CM | POA: Diagnosis not present

## 2015-07-08 DIAGNOSIS — E785 Hyperlipidemia, unspecified: Secondary | ICD-10-CM | POA: Diagnosis not present

## 2015-07-08 DIAGNOSIS — Z79899 Other long term (current) drug therapy: Secondary | ICD-10-CM | POA: Diagnosis not present

## 2015-07-09 LAB — COMPREHENSIVE METABOLIC PANEL
A/G RATIO: 2.1 (ref 1.1–2.5)
ALBUMIN: 4.1 g/dL (ref 3.5–4.8)
ALK PHOS: 69 IU/L (ref 39–117)
ALT: 23 IU/L (ref 0–44)
AST: 24 IU/L (ref 0–40)
BUN / CREAT RATIO: 23 — AB (ref 10–22)
BUN: 19 mg/dL (ref 8–27)
Bilirubin Total: 0.8 mg/dL (ref 0.0–1.2)
CO2: 21 mmol/L (ref 18–29)
CREATININE: 0.84 mg/dL (ref 0.76–1.27)
Calcium: 8.9 mg/dL (ref 8.6–10.2)
Chloride: 102 mmol/L (ref 97–106)
GFR calc Af Amer: 100 mL/min/{1.73_m2} (ref >59)
GFR calc non Af Amer: 87 mL/min/{1.73_m2} (ref >59)
GLOBULIN, TOTAL: 2 g/dL (ref 1.5–4.5)
Glucose: 90 mg/dL (ref 65–99)
POTASSIUM: 4.7 mmol/L (ref 3.5–5.2)
SODIUM: 139 mmol/L (ref 136–144)
Total Protein: 6.1 g/dL (ref 6.0–8.5)

## 2015-07-09 LAB — LIPID PANEL W/O CHOL/HDL RATIO
CHOLESTEROL TOTAL: 153 mg/dL (ref 100–199)
HDL: 80 mg/dL (ref >39)
LDL CALC: 62 mg/dL (ref 0–99)
TRIGLYCERIDES: 53 mg/dL (ref 0–149)
VLDL Cholesterol Cal: 11 mg/dL (ref 5–40)

## 2015-07-09 LAB — PSA: Prostate Specific Ag, Serum: 0.9 ng/mL (ref 0.0–4.0)

## 2015-07-12 ENCOUNTER — Encounter: Payer: Self-pay | Admitting: Family Medicine

## 2015-07-15 ENCOUNTER — Other Ambulatory Visit: Payer: Self-pay | Admitting: Cardiovascular Disease

## 2015-07-15 ENCOUNTER — Other Ambulatory Visit: Payer: Self-pay | Admitting: *Deleted

## 2015-07-15 MED ORDER — WARFARIN SODIUM 7.5 MG PO TABS
ORAL_TABLET | ORAL | Status: DC
Start: 2015-07-15 — End: 2015-08-12

## 2015-07-15 NOTE — Telephone Encounter (Signed)
Refill done as requested 

## 2015-07-25 ENCOUNTER — Other Ambulatory Visit: Payer: Self-pay | Admitting: Cardiovascular Disease

## 2015-07-25 DIAGNOSIS — Z7901 Long term (current) use of anticoagulants: Secondary | ICD-10-CM | POA: Diagnosis not present

## 2015-07-25 LAB — COAGUCHEK XS/INR WAIVED
INR: 3.1 — ABNORMAL HIGH (ref 0.9–1.1)
Prothrombin Time: 37.8 s

## 2015-08-01 ENCOUNTER — Encounter: Payer: Self-pay | Admitting: *Deleted

## 2015-08-02 ENCOUNTER — Telehealth: Payer: Self-pay | Admitting: Cardiovascular Disease

## 2015-08-02 NOTE — Telephone Encounter (Signed)
Pt wants to know if he needs to continue taking Zetia. He had his lab work,but never received results and whether he needs to continue taking the Zetia.

## 2015-08-02 NOTE — Telephone Encounter (Signed)
Returned call to patient.Leonard Cisneros mailed lab results 08/01/15.Advised labs good,continue Zetia.

## 2015-08-09 ENCOUNTER — Ambulatory Visit (INDEPENDENT_AMBULATORY_CARE_PROVIDER_SITE_OTHER): Payer: Medicare Other | Admitting: Pharmacist Clinician (PhC)/ Clinical Pharmacy Specialist

## 2015-08-09 DIAGNOSIS — I4891 Unspecified atrial fibrillation: Secondary | ICD-10-CM

## 2015-08-09 DIAGNOSIS — Z7901 Long term (current) use of anticoagulants: Secondary | ICD-10-CM

## 2015-08-12 ENCOUNTER — Other Ambulatory Visit: Payer: Self-pay | Admitting: Cardiovascular Disease

## 2015-08-31 ENCOUNTER — Other Ambulatory Visit: Payer: Self-pay | Admitting: Cardiovascular Disease

## 2015-08-31 DIAGNOSIS — Z7901 Long term (current) use of anticoagulants: Secondary | ICD-10-CM | POA: Diagnosis not present

## 2015-08-31 LAB — COAGUCHEK XS/INR WAIVED
INR: 2.8 — AB (ref 0.9–1.1)
Prothrombin Time: 33.5 s

## 2015-08-31 LAB — POCT INR: INR: 2.8

## 2015-09-06 ENCOUNTER — Ambulatory Visit (INDEPENDENT_AMBULATORY_CARE_PROVIDER_SITE_OTHER): Payer: Medicare Other | Admitting: Pharmacist Clinician (PhC)/ Clinical Pharmacy Specialist

## 2015-09-06 DIAGNOSIS — Z7901 Long term (current) use of anticoagulants: Secondary | ICD-10-CM

## 2015-09-06 DIAGNOSIS — I4891 Unspecified atrial fibrillation: Secondary | ICD-10-CM

## 2015-09-16 ENCOUNTER — Other Ambulatory Visit: Payer: Self-pay | Admitting: Cardiovascular Disease

## 2015-09-21 ENCOUNTER — Other Ambulatory Visit: Payer: Self-pay | Admitting: Cardiovascular Disease

## 2015-09-21 NOTE — Telephone Encounter (Signed)
Rx request sent to pharmacy.  

## 2015-10-11 ENCOUNTER — Other Ambulatory Visit: Payer: Self-pay | Admitting: Cardiovascular Disease

## 2015-10-11 DIAGNOSIS — Z7901 Long term (current) use of anticoagulants: Secondary | ICD-10-CM | POA: Diagnosis not present

## 2015-10-11 LAB — COAGUCHEK XS/INR WAIVED
INR: 2.8 — ABNORMAL HIGH (ref 0.9–1.1)
Prothrombin Time: 33.8 s

## 2015-10-12 ENCOUNTER — Ambulatory Visit (INDEPENDENT_AMBULATORY_CARE_PROVIDER_SITE_OTHER): Payer: Medicare Other | Admitting: Pharmacist Clinician (PhC)/ Clinical Pharmacy Specialist

## 2015-10-12 DIAGNOSIS — I4891 Unspecified atrial fibrillation: Secondary | ICD-10-CM

## 2015-10-12 DIAGNOSIS — Z7901 Long term (current) use of anticoagulants: Secondary | ICD-10-CM

## 2015-10-26 ENCOUNTER — Ambulatory Visit (INDEPENDENT_AMBULATORY_CARE_PROVIDER_SITE_OTHER): Payer: Medicare Other | Admitting: Orthopedic Surgery

## 2015-10-26 ENCOUNTER — Encounter: Payer: Self-pay | Admitting: Orthopedic Surgery

## 2015-10-26 ENCOUNTER — Ambulatory Visit (INDEPENDENT_AMBULATORY_CARE_PROVIDER_SITE_OTHER): Payer: Medicare Other

## 2015-10-26 VITALS — BP 110/70 | Ht 71.5 in | Wt 237.0 lb

## 2015-10-26 DIAGNOSIS — M25512 Pain in left shoulder: Secondary | ICD-10-CM

## 2015-10-26 DIAGNOSIS — M67912 Unspecified disorder of synovium and tendon, left shoulder: Secondary | ICD-10-CM

## 2015-10-26 DIAGNOSIS — M7522 Bicipital tendinitis, left shoulder: Secondary | ICD-10-CM | POA: Diagnosis not present

## 2015-10-26 MED ORDER — PREDNISONE 5 MG (21) PO TBPK: ORAL_TABLET | ORAL | Status: DC

## 2015-10-26 NOTE — Patient Instructions (Addendum)
Pick prescription up at pharmacy  Biceps Tendon Tendinitis (Proximal) and Tenosynovitis With Rehab Tendonitis and tenosynovitis involve inflammation of the tendon and the tendon lining (sheath). The proximal biceps tendon is vulnerable to tendonitis and tenosynovitis, which causes pain and discomfort in the front of the shoulder and upper arm. The tendon lining secretes a fluid that helps lubricate the tendon, allowing for proper function without pain. When the tendon and its lining become inflamed, the tendon can no longer glide smoothly, causing pain. The proximal biceps tendon connects the biceps muscle to two bones of the shoulder. It is important for proper function of the elbow and turning the palm upward (supination) using the wrist. Proximal biceps tendon tendinitis may include a grade 1 or 2 strain of the tendon. Grade 1 strains involve a slight pull of the tendon without signs of tearing and no observed tendon lengthening. There is also no loss of strength. Grade 2 strains involve small tears in the tendon fibers. The tendon or muscle is stretched and strength is usually decreased.  SYMPTOMS   Pain, tenderness, swelling, warmth, or redness over the front of the shoulder.  Pain that gets worse with shoulder and elbow use, especially against resistance.  Limited motion of the shoulder or elbow.  Crackling sound (crepitation) when the tendon or shoulder is moved or touched. CAUSES  The symptoms of biceps tendonitis are due to inflammation of the tendon. Inflammation may be caused by:  Strain from sudden increase in amount or intensity of activity.  Direct blow or injury to the elbow (uncommon).  Overuse or repetitive elbow bending or wrist rotation, particularly when turning the palm up, or with elbow hyperextension. RISK INCREASES WITH:  Sports that involve contact or overhead arm activity (throwing sports, gymnastics, weightlifting, bodybuilding, rock climbing).  Heavy  labor.  Poor strength and flexibility.  Failure to warm up properly before activity. PREVENTION  Warm up and stretch properly before activity.  Allow time for recovery between activities.  Maintain physical fitness:  Strength, flexibility, and endurance.  Cardiovascular fitness.  Learn and use proper exercise technique. PROGNOSIS  With proper treatment, proximal biceps tendon tendonitis and tenosynovitis is usually curable within 6 weeks. Healing is usually quicker if the cause was a direct blow, not overuse.  RELATED COMPLICATIONS   Longer healing time if not properly treated or if not given enough time to heal.  Chronically inflamed tendon that causes persistent pain with activity, that may progress to constant pain and potentially rupture of the tendon.  Recurring symptoms, especially if activity is resumed too soon or with overuse, a direct blow, or use of poor exercise technique. TREATMENT Treatment first involves ice and medicine, to reduce pain and inflammation. It is helpful to modify activities that cause pain, to reduce the chances of causing the condition to get worse. Strengthening and stretching exercises should be performed to promote proper use of the muscles of the shoulder. These exercises may be performed at home or with a therapist. Other treatments may be given such as ultrasound or heat therapy. A corticosteroid injection may be recommended to help reduce inflammation of the tendon lining. Surgery is usually not necessary. Sometimes, if symptoms last for greater than 6 months, surgery will be advised to detach the tendon and re-insert it into the arm bone. Surgery to correct other shoulder problems that may be contributing to tendinitis may be advised before surgery for the tendinitis itself.  MEDICATION  If pain medicine is needed, nonsteroidal anti-inflammatory medicines (aspirin and  ibuprofen), or other minor pain relievers (acetaminophen), are often  advised.  Do not take pain medicine for 7 days before surgery.  Prescription pain relievers may be given if your caregiver thinks they are needed. Use only as directed and only as much as you need.  Corticosteroid injections may be given. These injections should only be used on the most severe cases, as one can only receive a limited number of them. HEAT AND COLD   Cold treatment (icing) should be applied for 10 to 15 minutes every 2 to 3 hours for inflammation and pain, and immediately after activity that aggravates your symptoms. Use ice packs or an ice massage.  Heat treatment may be used before performing stretching and strengthening activities prescribed by your caregiver, physical therapist, or athletic trainer. Use a heat pack or a warm water soak. SEEK MEDICAL CARE IF:   Symptoms get worse or do not improve in 2 weeks, despite treatment.  New, unexplained symptoms develop. (Drugs used in treatment may produce side effects.) EXERCISES hold for 2 sec  Repeat 10 x 3 sets, once daily for all exercises   RANGE OF MOTION (ROM) AND EXERCISES - Biceps Tendon (Proximal) and Tenosynovitis These exercises may help you when beginning to rehabilitate your injury. Your symptoms may go away with or without further involvement from your physician, physical therapist, or athletic trainer. While completing these exercises, remember:   Restoring tissue flexibility helps normal motion to return to the joints. This allows healthier, less painful movement and activity.  An effective stretch should be held for at least 30 seconds.  A stretch should never be painful. You should only feel a gentle lengthening or release in the stretched tissue. STRETCH - Flexion, Standing  Stand with good posture. With an underhand grip on your right / left hand and an overhand grip on the opposite hand, grasp a broomstick or cane so that your hands are a little more than shoulder width apart.  Keeping your right /  left elbow straight and shoulder muscles relaxed, push the stick with your opposite hand to raise your right / left arm in front of your body and then overhead. Raise your arm until you feel a stretch in your right / left shoulder, but before you have increased shoulder pain.  Try to avoid shrugging your right / left shoulder as your arm rises, by keeping your shoulder blade tucked down and toward your mid-back spine. Hold for __________ seconds.  Slowly return to the starting position. Repeat __________ times. Complete this exercise __________ times per day. STRETCH - Abduction, Supine  Lie on your back. With an underhand grip on your right / left hand and an overhand grip on the opposite hand, grasp a broomstick or cane so that your hands are a little more than shoulder width apart.  Keeping your right / left elbow straight and shoulder muscles relaxed, push the stick with your opposite hand to raise your right / left arm out to the side of your body and then overhead. Raise your arm until you feel a stretch in your right / left shoulder, but before you have increased shoulder pain.  Try to avoid shrugging your right / left shoulder as your arm rises, by keeping your shoulder blade tucked down and toward your mid-back spine. Hold for __________ seconds.  Slowly return to the starting position. Repeat __________ times. Complete this exercise __________ times per day. ROM - Flexion, Active-Assisted  Lie on your back. You may bend your  knees for comfort.  Grasp a broomstick or cane so your hands are about shoulder width apart. Your right / left hand should grip the end of the stick so that your hand is positioned "thumbs-up," as if you were about to shake hands.  Using your healthy arm to lead, raise your right / left arm overhead until you feel a gentle stretch in your shoulder. Hold for __________ seconds.  Use the stick to assist in returning your right / left arm to its starting  position. Repeat __________ times. Complete this exercise __________ times per day.  STRETCH - Flexion, Standing   Stand facing a wall. Walk your right / left fingers up the wall until you feel a moderate stretch in your shoulder. As your hand gets higher, you may need to step closer to the wall or use a door frame to walk through.  Try to avoid shrugging your right / left shoulder as your arm rises, by keeping your shoulder blade tucked down and toward your mid-back spine.  Hold for __________ seconds. Use your other hand, if needed, to ease out of the stretch and return to the starting position. Repeat __________ times. Complete this exercise __________ times per day.  ROM - Internal Rotation   Using underhand grips, grasp a stick behind your back with both hands.  While standing upright with good posture, slide the stick up your back until you feel a mild stretch in the front of your shoulder.  Hold for __________ seconds. Slowly return to your starting position. Repeat __________ times. Complete this exercise __________ times per day.  STRETCH - Internal Rotation  Place your right / left hand behind your back, palm-up.  Throw a towel or belt over your opposite shoulder. Grasp the towel with your right / left hand.  While keeping an upright posture, gently pull up on the towel until you feel a stretch in the front of your right / left shoulder.  Avoid shrugging your right / left shoulder as your arm rises, by keeping your shoulder blade tucked down and toward your mid-back spine.  Hold for __________ seconds. Release the stretch by lowering your opposite hand. Repeat __________ times. Complete this exercise __________ times per day. STRENGTHENING EXERCISES - Biceps Tendon Tendinitis (Proximal) and Tenosynovitis These exercises may help you regain your strength after your physician has discontinued your restraint in a cast or brace. They may resolve your symptoms with or without  further involvement from your physician, physical therapist or athletic trainer. While completing these exercises, remember:   Muscles can gain both the endurance and the strength needed for everyday activities through controlled exercises.  Complete these exercises as instructed by your physician, physical therapist or athletic trainer. Increase the resistance and repetitions only as guided.  You may experience muscle soreness or fatigue, but the pain or discomfort you are trying to eliminate should never worsen during these exercises. If this pain does get worse, stop and make sure you are following the directions exactly. If the pain is still present after adjustments, discontinue the exercise until you can discuss the trouble with your caregiver. STRENGTH - Elbow Flexors, Isometric  Stand or sit upright on a firm surface. Place your right / left arm so that your hand is palm-up and at the height of your waist.  Place your opposite hand on top of your forearm. Gently push down as your right / left arm resists. Push as hard as you can with both arms, without causing any  pain or movement at your right / left elbow. Hold this stationary position for __________ seconds.  Gradually release the tension in both arms. Allow your muscles to relax completely before repeating. Repeat __________ times. Complete this exercise __________ times per day. STRENGTH - Shoulder Flexion, Isometric  With good posture and facing a wall, stand or sit about 4-6 inches away.  Keeping your right / left elbow straight, gently press the top of your fist into the wall. Increase the pressure gradually until you are pressing as hard as you can, without shrugging your shoulder or increasing any shoulder discomfort.  Hold for __________ seconds.  Release the tension slowly. Relax your shoulder muscles completely before you start the next repetition. Repeat __________ times. Complete this exercise __________ times per day.   STRENGTH - Elbow Flexors, Supinated  With good posture, stand or sit on a firm chair without armrests. Allow your right / left arm to rest at your side with your palm facing forward.  Holding a __________ weight, or gripping a rubber exercise band or tubing,  bring your hand toward your shoulder.  Allow your muscles to control the resistance as your hand returns to your side. Repeat __________ times. Complete this exercise __________ times per day.  STRENGTH - Shoulder Flexion  Stand or sit with good posture. Grasp a __________ weight, or an exercise band or tubing, so that your hand is "thumbs-up," like when you shake hands.  Slowly lift your right / left arm as far as you can, without increasing any shoulder pain. At first, many people can only raise their hand to shoulder height.  Avoid shrugging your right / left shoulder as your arm rises, by keeping your shoulder blade tucked down and toward your mid-back spine.  Hold for __________ seconds. Control the descent of your hand as you slowly return to your starting position. Repeat __________ times. Complete this exercise __________ times per day.   This information is not intended to replace advice given to you by your health care provider. Make sure you discuss any questions you have with your health care provider.   Document Released: 08/20/2005 Document Revised: 09/10/2014 Document Reviewed: 12/02/2008 Elsevier Interactive Patient Education 2016 [place] have received an injection of steroids into the joint. 15% of patients will have increased pain within the 24 hours postinjection.   This is transient and will go away.   We recommend that you use ice packs on the injection site for 20 minutes every 2 hours and extra strength Tylenol 2 tablets every 8 as needed until the pain resolves.  If you continue to have pain after taking the Tylenol and using the ice please call the office for further instructions.

## 2015-10-26 NOTE — Progress Notes (Signed)
Patient ID: Leonard Cisneros, male   DOB: 1941/05/11, 75 y.o.   MRN: [id number]  Chief Complaint  Patient presents with  . Shoulder Pain    Left shoulder pain    HPI Leonard Cisneros is a 75 y.o. male.  Presents for evaluation of left shoulder  Patient says he's been working out in his usual fashion and then over the last 4-5 weeks noted bilateral shoulder pain left worse than right painful forward elevation with aching pain in the left shoulder anterior joint line and decreased range of motion mild weakness is also noted  Review of systems troubles with his left hip has had a revision has some mild back pain  Bowel bladder function intact  No numbness or tingling or cervical spine symptoms  Review of Systems Review of Systems  See review of systems   Past Medical History  Diagnosis Date  . Atrial fibrillation (HCC)     chronic coumadin  . CAD (coronary artery disease)   . CHF (congestive heart failure) ([place])   . Hypertension   . Dysrhythmia     afib  . Arthritis     knee left  . Unspecified hereditary and idiopathic peripheral neuropathy 04/04/2014    Past Surgical History  Procedure Laterality Date  . Colonoscopy  06/04/2002    Normal colon and rectum  . Hip surgery      x4  both hips  . Back surgery    . Rotator cuff repair    . Coronary artery bypass graft  2003    LIMA to LAD,LIMA to distal RCA,SVG to ramus intermediate vessel.  . 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 Colonoscopy  07/08/2012    Procedure: COLONOSCOPY;  Surgeon: [name], MD;  Location: [facility];  Service: Endoscopy;  Laterality: N/A;  11:30  . US echocardiography  11/13/2011    LV mildly dilated,mild concentric LVH,LA mod - severely dilated,RA mildly dilated,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  . Foreign body removal Right  04/02/2014    Procedure: FOREIGN BODY REMOVAL RIGHT FOOT;  Surgeon: [name], MD;  Location: [facility];  Service: General;  Laterality: Right;    Family History  Problem Relation Age of Onset  . Colon cancer Neg Hx   . Heart attack Brother     Social History Social History  Substance Use Topics  . Smoking status: Former Smoker -- 0.50 packs/day for 2 years    Types: Cigarettes    Quit date: 09/03/1966  . Smokeless tobacco: None     Comment: Quit about 40 years ago  . Alcohol Use: Yes     Comment: a glass of wine with dinner/ beer on the weekend    Allergies  Allergen Reactions  . Augmentin [Amoxicillin-Pot Clavulanate] Nausea And Vomiting    Current Outpatient Prescriptions  Medication Sig Dispense Refill  . diltiazem (DILACOR XR) 240 MG 24 hr capsule TAKE 1 CAPSULE BY MOUTH ONCE DAILY. 30 capsule 5  . ezetimibe (ZETIA) 10 MG tablet Take 1 tablet (10 mg total) by mouth daily. 90 tablet 3  . metoprolol succinate (TOPROL-XL) 50 MG 24 hr tablet TAKE 1 TABLET BY MOUTH ONCE DAILY. 30 tablet 5  . oxyCODONE-acetaminophen (PERCOCET/ROXICET) 5-325 MG per tablet Take 1 tablet by mouth every 4 (four) hours as needed for moderate pain  or severe pain.    . ramipril (ALTACE) 2.5 MG capsule take 1 capsule by mouth once daily 15 capsule 6  . warfarin (COUMADIN) 7.5 MG tablet TAKE 1 TO 1 & 1/2 TABLET BY MOUTH ONCE DAILY AS DIRECTED. 40 tablet 0   No current facility-administered medications for this visit.       Physical Exam Physical Exam Blood pressure 110/70, height 5' 11.5" (1.816 m), weight 237 lb (107.502 kg). Appearance, there are no abnormalities in terms of appearance the patient was well-developed and well-nourished. The grooming and hygiene were normal.  Mental status orientation, there was normal alertness and orientation Mood pleasant Ambulatory status normal with no assistive devices  Examination of the left shoulder reveals tenderness in the anterior joint line  rotator interval Range of motion flexion = 110 active 150 passive Range of motion external rotation = 50 Range of motion internal rotation = sacral level I  The patient is stable in abduction external rotation  Rotator cuff strength internal and external rotation grade 5 Strength of the supraspinatus grade  4  /5  Skin warm dry and intact without laceration or ulceration or erythema Neurologic examination normal sensation Vascular examination normal pulses with warm extremity and normal capillary refill  The opposite extremity right shoulder shows passive range of motion 150 active 110 Internal rotation approximately T12  Data Reviewed Imaging of the left shoulder are independently reviewed and I interpreted these as proximal migration of the humerus break in [place]'s line of the shoulder greater tuberosity sclerosis type II acromion  This is consistent with chronic rotator cuff disease and possible chronic rotator cuff tear  Assessment  Chronic rotator cuff disease  Biceps tendinitis  Inject left biceps tendon  He gave verbal consent, timeout to confirm injection site, alcohol and ethyl chloride to prep the skin injection given with 25-gauge needle 40 mg of Depo-Medrol 1% lidocaine 3 mL  Tolerated well without crepitation    Plan  Injection, physical therapy at home Return in 6 weeks

## 2015-11-11 ENCOUNTER — Other Ambulatory Visit: Payer: Self-pay | Admitting: Cardiovascular Disease

## 2015-11-22 ENCOUNTER — Ambulatory Visit: Payer: Medicare Other | Admitting: Orthopedic Surgery

## 2015-11-28 ENCOUNTER — Other Ambulatory Visit: Payer: Self-pay | Admitting: Cardiovascular Disease

## 2015-11-28 DIAGNOSIS — Z7901 Long term (current) use of anticoagulants: Secondary | ICD-10-CM | POA: Diagnosis not present

## 2015-11-28 LAB — PROTIME-INR: INR: 3.5 — AB (ref <=1.1)

## 2015-11-29 ENCOUNTER — Ambulatory Visit: Payer: Medicare Other | Admitting: Orthopedic Surgery

## 2015-11-29 LAB — COAGUCHEK XS/INR WAIVED
INR: 3.5 — AB (ref 0.9–1.1)
PROTHROMBIN TIME: 42.4 s

## 2015-11-30 ENCOUNTER — Encounter: Payer: Self-pay | Admitting: Orthopedic Surgery

## 2015-11-30 ENCOUNTER — Ambulatory Visit (INDEPENDENT_AMBULATORY_CARE_PROVIDER_SITE_OTHER): Payer: Medicare Other | Admitting: Orthopedic Surgery

## 2015-11-30 VITALS — BP 114/85 | Ht 71.5 in | Wt 237.0 lb

## 2015-11-30 DIAGNOSIS — M7522 Bicipital tendinitis, left shoulder: Secondary | ICD-10-CM | POA: Diagnosis not present

## 2015-11-30 DIAGNOSIS — M25512 Pain in left shoulder: Secondary | ICD-10-CM | POA: Diagnosis not present

## 2015-11-30 NOTE — Progress Notes (Signed)
Patient ID: Leonard Cisneros, male   DOB: [birth year], 75 y.o.   MRN: [id number]  Chief Complaint  Patient presents with  . Follow-up    FOLLOW UP LEFT BICEPS TENDONITIS    HPI status post injection biceps tendon for biceps tendinitis. He did well until last Thursday symptoms started to return  Presents for reevaluation  ROS as noted on previous visit with his left hip status post revision and mild back pain  BP 114/85 mmHg  Ht 5' 11.5" (1.816 m)  Wt 237 lb (107.502 kg)  BMI 32.60 kg/m2  Physical Exam Tenderness over the left biceps tendon Ortho Exam    ASSESSMENT AND PLAN   Second injection given left biceps tendon   A steroid injection was performed at left biceps tendon using 1% plain Lidocaine and 40 mg of Depo-Medrol and 1% lidocaine as stated  Tolerated well, patient  Patient stable at discharge   Return in 4 weeks

## 2015-12-02 ENCOUNTER — Telehealth (HOSPITAL_COMMUNITY): Payer: Self-pay

## 2015-12-02 NOTE — Telephone Encounter (Signed)
Encounter complete. 

## 2015-12-05 ENCOUNTER — Ambulatory Visit (INDEPENDENT_AMBULATORY_CARE_PROVIDER_SITE_OTHER): Payer: Medicare Other | Admitting: Pharmacist Clinician (PhC)/ Clinical Pharmacy Specialist

## 2015-12-05 DIAGNOSIS — I4891 Unspecified atrial fibrillation: Secondary | ICD-10-CM

## 2015-12-05 DIAGNOSIS — Z7901 Long term (current) use of anticoagulants: Secondary | ICD-10-CM

## 2015-12-07 ENCOUNTER — Ambulatory Visit (HOSPITAL_COMMUNITY)
Admission: RE | Admit: 2015-12-07 | Discharge: 2015-12-07 | Disposition: A | Discharge status: Home / Self Care | Payer: Medicare Other | Source: Ambulatory Visit | Attending: Cardiovascular Disease | Admitting: Cardiovascular Disease

## 2015-12-07 DIAGNOSIS — I4891 Unspecified atrial fibrillation: Secondary | ICD-10-CM | POA: Diagnosis not present

## 2015-12-07 DIAGNOSIS — I10 Essential (primary) hypertension: Secondary | ICD-10-CM | POA: Insufficient documentation

## 2015-12-07 DIAGNOSIS — Z8249 Family history of ischemic heart disease and other diseases of the circulatory system: Secondary | ICD-10-CM | POA: Insufficient documentation

## 2015-12-07 DIAGNOSIS — E663 Overweight: Secondary | ICD-10-CM | POA: Insufficient documentation

## 2015-12-07 DIAGNOSIS — Z87891 Personal history of nicotine dependence: Secondary | ICD-10-CM | POA: Insufficient documentation

## 2015-12-07 DIAGNOSIS — I25810 Atherosclerosis of coronary artery bypass graft(s) without angina pectoris: Secondary | ICD-10-CM | POA: Insufficient documentation

## 2015-12-07 DIAGNOSIS — Z6832 Body mass index (BMI) 32.0-32.9, adult: Secondary | ICD-10-CM | POA: Insufficient documentation

## 2015-12-07 LAB — MYOCARDIAL PERFUSION IMAGING
CHL CUP MPHR: 146 {beats}/min
CHL CUP NUCLEAR SDS: 3
CHL CUP RESTING HR STRESS: 91 {beats}/min
CHL RATE OF PERCEIVED EXERTION: 17
CSEPEDS: 31 s
CSEPEW: 7 METS
CSEPPHR: 176 {beats}/min
Exercise duration (min): 5 min
NUC STRESS TID: 1.12
Percent HR: 120 %
SRS: 1
SSS: 4

## 2015-12-07 MED ORDER — TECHNETIUM TC 99M SESTAMIBI GENERIC - CARDIOLITE: 31.0000 | Freq: Once | INTRAVENOUS | Status: DC | PRN

## 2015-12-07 MED ORDER — TECHNETIUM TC 99M SESTAMIBI GENERIC - CARDIOLITE: 10.5000 | Freq: Once | INTRAVENOUS | Status: DC | PRN

## 2015-12-14 ENCOUNTER — Other Ambulatory Visit: Payer: Self-pay | Admitting: Cardiovascular Disease

## 2015-12-15 ENCOUNTER — Other Ambulatory Visit: Payer: Self-pay | Admitting: Cardiovascular Disease

## 2015-12-15 NOTE — Telephone Encounter (Signed)
Ramipril refilled 12/15/15

## 2015-12-15 NOTE — Telephone Encounter (Signed)
Rx request sent to pharmacy.  

## 2015-12-28 ENCOUNTER — Ambulatory Visit: Payer: Medicare Other | Admitting: Orthopedic Surgery

## 2016-01-09 ENCOUNTER — Encounter: Payer: Self-pay | Admitting: *Deleted

## 2016-01-11 ENCOUNTER — Telehealth: Payer: Self-pay | Admitting: Pharmacist

## 2016-01-11 NOTE — Telephone Encounter (Signed)
Called patient to follow-up since INR is overdue. He stated he would go to the lab tomorrow then immediately hung up.

## 2016-01-12 ENCOUNTER — Other Ambulatory Visit: Payer: Self-pay | Admitting: Cardiovascular Disease

## 2016-01-12 DIAGNOSIS — Z7901 Long term (current) use of anticoagulants: Secondary | ICD-10-CM | POA: Diagnosis not present

## 2016-01-12 LAB — COAGUCHEK XS/INR WAIVED
INR: 2.8 — ABNORMAL HIGH (ref 0.9–1.1)
Prothrombin Time: 33.5 s

## 2016-01-18 ENCOUNTER — Ambulatory Visit (INDEPENDENT_AMBULATORY_CARE_PROVIDER_SITE_OTHER): Payer: Medicare Other | Admitting: Pharmacist Clinician (PhC)/ Clinical Pharmacy Specialist

## 2016-01-18 DIAGNOSIS — Z7901 Long term (current) use of anticoagulants: Secondary | ICD-10-CM

## 2016-01-18 DIAGNOSIS — I4891 Unspecified atrial fibrillation: Secondary | ICD-10-CM

## 2016-01-24 ENCOUNTER — Other Ambulatory Visit: Payer: Self-pay

## 2016-01-24 NOTE — Telephone Encounter (Signed)
patient called in to have meds sent over to [facility] because his meds will be cheaper.  meds sent over to pharmacy, patient notified.

## 2016-01-26 ENCOUNTER — Other Ambulatory Visit: Payer: Self-pay | Admitting: Cardiovascular Disease

## 2016-01-26 ENCOUNTER — Other Ambulatory Visit: Payer: Self-pay | Admitting: *Deleted

## 2016-01-26 ENCOUNTER — Ambulatory Visit
Admission: RE | Admit: 2016-01-26 | Discharge: 2016-01-26 | Disposition: A | Discharge status: Home / Self Care | Payer: Medicare Other | Source: Ambulatory Visit | Attending: Cardiovascular Disease | Admitting: Cardiovascular Disease

## 2016-01-26 ENCOUNTER — Ambulatory Visit (INDEPENDENT_AMBULATORY_CARE_PROVIDER_SITE_OTHER): Payer: Medicare Other | Admitting: Cardiovascular Disease

## 2016-01-26 ENCOUNTER — Encounter: Payer: Self-pay | Admitting: Cardiovascular Disease

## 2016-01-26 VITALS — BP 127/78 | HR 75 | Ht 72.0 in | Wt 235.6 lb

## 2016-01-26 DIAGNOSIS — E785 Hyperlipidemia, unspecified: Secondary | ICD-10-CM

## 2016-01-26 DIAGNOSIS — D689 Coagulation defect, unspecified: Secondary | ICD-10-CM

## 2016-01-26 DIAGNOSIS — I10 Essential (primary) hypertension: Secondary | ICD-10-CM

## 2016-01-26 DIAGNOSIS — I2510 Atherosclerotic heart disease of native coronary artery without angina pectoris: Secondary | ICD-10-CM

## 2016-01-26 DIAGNOSIS — R0789 Other chest pain: Secondary | ICD-10-CM

## 2016-01-26 DIAGNOSIS — I25110 Atherosclerotic heart disease of native coronary artery with unstable angina pectoris: Secondary | ICD-10-CM

## 2016-01-26 DIAGNOSIS — Z01818 Encounter for other preprocedural examination: Secondary | ICD-10-CM

## 2016-01-26 DIAGNOSIS — I208 Other forms of angina pectoris: Secondary | ICD-10-CM

## 2016-01-26 DIAGNOSIS — Z7901 Long term (current) use of anticoagulants: Secondary | ICD-10-CM | POA: Diagnosis not present

## 2016-01-26 DIAGNOSIS — E669 Obesity, unspecified: Secondary | ICD-10-CM

## 2016-01-26 DIAGNOSIS — I4891 Unspecified atrial fibrillation: Secondary | ICD-10-CM | POA: Diagnosis not present

## 2016-01-26 DIAGNOSIS — R079 Chest pain, unspecified: Secondary | ICD-10-CM | POA: Diagnosis not present

## 2016-01-26 MED ORDER — METOPROLOL SUCCINATE ER 50 MG PO TB24: ORAL_TABLET | ORAL | Status: DC

## 2016-01-26 MED ORDER — RAMIPRIL 2.5 MG PO CAPS: 2.5000 mg | ORAL_CAPSULE | Freq: Every day | ORAL | Status: DC

## 2016-01-26 MED ORDER — WARFARIN SODIUM 7.5 MG PO TABS: 7.5000 mg | ORAL_TABLET | Freq: Every day | ORAL | Status: DC

## 2016-01-26 MED ORDER — NITROGLYCERIN 0.4 MG SL SUBL: 0.4000 mg | SUBLINGUAL_TABLET | SUBLINGUAL | Status: DC | PRN

## 2016-01-26 MED ORDER — ASPIRIN EC 81 MG PO TBEC: 81.0000 mg | DELAYED_RELEASE_TABLET | Freq: Every day | ORAL | Status: DC

## 2016-01-26 MED ORDER — DILTIAZEM HCL ER 240 MG PO CP24: 240.0000 mg | ORAL_CAPSULE | Freq: Every day | ORAL | Status: DC

## 2016-01-26 MED ORDER — EZETIMIBE 10 MG PO TABS: 10.0000 mg | ORAL_TABLET | Freq: Every day | ORAL | Status: DC

## 2016-01-26 MED ORDER — METOPROLOL SUCCINATE ER 50 MG PO TB24: 50.0000 mg | ORAL_TABLET | Freq: Every day | ORAL | Status: DC

## 2016-01-26 MED ORDER — ISOSORBIDE MONONITRATE ER 30 MG PO TB24: 30.0000 mg | ORAL_TABLET | Freq: Every day | ORAL | Status: DC

## 2016-01-26 NOTE — Patient Instructions (Signed)
Your physician has recommended you make the following change in your medication:   1.) start Aspirin 81 mg daily.  2.) start new prescription for isosorbide 30 mg .  3.) the metoprolol has been increased to 75 mg daily ( 1.5 tablets).  4.) HOLD YOUR WARFARIN SUN, MON, TUES  Your physician has requested that you have a cardiac catheterization. Cardiac catheterization is used to diagnose and/or treat various heart conditions. Doctors may recommend this procedure for a number of different reasons. The most common reason is to evaluate chest pain. Chest pain can be a symptom of coronary artery disease (CAD), and cardiac catheterization can show whether plaque is narrowing or blocking your heart's arteries. This procedure is also used to evaluate the valves, as well as measure the blood flow and oxygen levels in different parts of your heart. For further information please visit [email or web address].  THIS WILL BE DONE ON Wednesday MAY 31st  Following your catheterization, you will not be allowed to drive for 3 days.  No lifting, pushing, or pulling greater that 10 pounds is allowed for 1 week.  You will be required to have the following tests prior to the procedure:  1. Blood work-the blood work can be done no more than 7 days prior to the procedure.  It can be done at any [facility] lab.  There is one downstairs on the first floor of this building and one in the [facility] building [id number]).  2. Chest Xray-the chest xray order has already been placed at the [place].      Puncture site

## 2016-01-27 ENCOUNTER — Other Ambulatory Visit: Payer: Self-pay | Admitting: Cardiovascular Disease

## 2016-01-27 NOTE — Telephone Encounter (Signed)
REFILL 

## 2016-01-28 ENCOUNTER — Encounter: Payer: Self-pay | Admitting: Cardiovascular Disease

## 2016-01-28 DIAGNOSIS — E785 Hyperlipidemia, unspecified: Secondary | ICD-10-CM | POA: Insufficient documentation

## 2016-01-28 DIAGNOSIS — I2089 Other forms of angina pectoris: Secondary | ICD-10-CM | POA: Insufficient documentation

## 2016-01-28 DIAGNOSIS — I208 Other forms of angina pectoris: Secondary | ICD-10-CM | POA: Insufficient documentation

## 2016-01-28 NOTE — Progress Notes (Signed)
Patient ID: Leonard Cisneros, male   DOB: 05-13-1941, 75 y.o.   MRN: [id number]     Primary M.D.: Dr. [name]  HPI: Leonard Cisneros is a 75 y.o. male who presents to the office today for a 9 month follow up cardiology evaluation.  Chief complaint is that of recurrent episodes of exertionally precipitated chest pain.  Leonard Cisneros has CAD and in 2003 underwent CABG surgery by Dr. Roxy Manns with LIMA to the LAD, RIMA to the distal RCA, and vein graft to his ramus intermediate vessel.  Initially, he had postoperative atrial fibrillation, which was controlled with Rythmol.  He is status post atrial flutter ablation by Dr. [name].  Over the last several years he has been in permanent atrial fibrillation and has been on warfarin anticoagulation.  His last nuclear perfusion study in March 2014 was essentially unchanged from February 2012 and did not reveal significant scar or ischemia.  He has a history of her left hip surgery with revision.  When I saw him last year he was exercising regularly, typically 6 days per week.   He denied any associated chest pressure.  On 12/07/2015.  He underwent a nuclear stress test for three-year follow-up evaluation.  At that time, he still remained asymptomatic.  On the stress test he developed 1-2 mm of asymptomatic horizontal ST segment depression in the inferolateral leads at peak stress, and had a hypertensive blood pressure response to exercise.  Scintigraphic images continues to show normal perfusion.  However, over the past month he has started to notice that when he pushes his lawnmower.  He has to stop multiple times to cut the grass because of development of a chest pressure and shortness of breath.  In addition, he has noticed that even with walking without pushing a lawnmower.  He has developed chest discomfort described as tightness and shortness of breath.  He denies any symptoms which occur at rest.  He presents for evaluation.   Past Medical History    Diagnosis Date  . Atrial fibrillation (HCC)     chronic coumadin  . CAD (coronary artery disease)   . CHF (congestive heart failure) ([place])   . Hypertension   . Dysrhythmia     afib  . Arthritis     knee left  . Unspecified hereditary and idiopathic peripheral neuropathy 04/04/2014    Past Surgical History  Procedure Laterality Date  . Colonoscopy  06/04/2002    Normal colon and rectum  . Hip surgery      x4  both hips  . Back surgery    . Rotator cuff repair    . Coronary artery bypass graft  2003    LIMA to LAD,LIMA to distal RCA,SVG to ramus intermediate vessel.  . 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 Colonoscopy  07/08/2012    Procedure: COLONOSCOPY;  Surgeon: [name], MD;  Location: [facility];  Service: Endoscopy;  Laterality: N/A;  11:30  . US echocardiography  11/13/2011    LV mildly dilated,mild concentric LVH,LA mod - severely dilated,RA mildly dilated,mild to mod. 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  . Foreign body removal Right 04/02/2014    Procedure: FOREIGN BODY REMOVAL RIGHT FOOT;  Surgeon: [name], MD;  Location: [facility] ORS;  Service: General;  Laterality: Right;    Allergies  Allergen Reactions  .  Augmentin [Amoxicillin-Pot Clavulanate] Nausea And Vomiting    Current Outpatient Prescriptions  Medication Sig Dispense Refill  . ezetimibe (ZETIA) 10 MG tablet Take 1 tablet (10 mg total) by mouth daily. (Patient taking differently: Take 10 mg by mouth every morning. ) 90 tablet 0  . metoprolol succinate (TOPROL-XL) 50 MG 24 hr tablet Take 1.5 tablets daily ( 75 mg). (Patient taking differently: Take 75 mg by mouth every morning. ) 90 tablet 0  . oxyCODONE-acetaminophen (PERCOCET/ROXICET) 5-325 MG per tablet Take 1 tablet by mouth every 4 (four) hours as needed for moderate pain or severe pain.    [name] warfarin  (COUMADIN) 7.5 MG tablet Take 1 tablet (7.5 mg total) by mouth daily. (Patient taking differently: Take 7.5 mg by mouth every morning. ) 90 tablet 0  . aspirin EC 81 MG tablet Take 1 tablet (81 mg total) by mouth daily. (Patient taking differently: Take 81 mg by mouth every morning. ) 90 tablet 3  . diltiazem (DILACOR XR) 240 MG 24 hr capsule TAKE 1 CAPSULE BY MOUTH ONCE DAILY. 30 capsule 2  . ibuprofen (ADVIL,MOTRIN) 200 MG tablet Take 400 mg by mouth 3 (three) times daily as needed for moderate pain.    . isosorbide mononitrate (IMDUR) 30 MG 24 hr tablet Take 1 tablet (30 mg total) by mouth daily. (Patient taking differently: Take 30 mg by mouth every morning. ) 30 tablet 3  . nitroGLYCERIN (NITROSTAT) 0.4 MG SL tablet Place 1 tablet (0.4 mg total) under the tongue every 5 (five) minutes as needed for chest pain. 25 tablet 3  . ramipril (ALTACE) 2.5 MG capsule TAKE 1 CAPSULE BY MOUTH ONCE DAILY. 30 capsule 3   No current facility-administered medications for this visit.    Social History   Social History  . Marital Status: Married    Spouse Name: N/A  . Number of Children: 2  . Years of Education: N/A   Occupational History  . 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 Smoking status: Former Smoker -- 0.50 packs/day for 2 years    Types: Cigarettes    Quit date: 09/03/1966  . Smokeless tobacco: Not on file     Comment: Quit about 40 years ago  . Alcohol Use: 0.0 oz/week    0 Standard drinks or equivalent per week     Comment: a glass of wine with dinner/ beer on the weekend  . Drug Use: No  . Sexual Activity: Not on file   Other Topics Concern  . Not on file   Social History Narrative    Family History  Problem Relation Age of Onset  . Colon cancer Neg Hx   . Heart attack Brother     ROS General: Negative; No fevers, chills, or night sweats HEENT: Negative; No changes in vision or hearing, sinus congestion, difficulty swallowing Pulmonary: Negative;  No cough, wheezing, shortness of breath, hemoptysis Cardiovascular:   See HPI;positive for CABG revascularization surgery, positive for atrial flutter ablation, positive for permanent atrial fibrillation;  GI: Negative; No nausea, vomiting, diarrhea, or abdominal pain GU: Negative; No dysuria, hematuria, or difficulty voiding Musculoskeletal: Positive for left hip surgery with revision; no myalgias, joint pain, or weakness Hematologic: Negative; no easy bruising, bleeding Endocrine: Negative; no heat/cold intolerance; no diabetes Neuro: Negative; no changes in balance, headaches Skin: Negative; No rashes or skin lesions Psychiatric: Negative; No behavioral problems, depression Sleep: Negative; No snoring,  daytime sleepiness, hypersomnolence, bruxism, restless legs, hypnogognic hallucinations. Other  comprehensive 14 point system review is negative   Physical Exam BP 127/78 mmHg  Pulse 75  Ht 6' (1.829 m)  Wt 235 lb 9.6 oz (106.867 kg)  BMI 31.95 kg/m2   Repeat blood pressure by me was 158/78.  Wt Readings from Last 3 Encounters:  01/26/16 235 lb 9.6 oz (106.867 kg)  12/07/15 237 lb (107.502 kg)  11/30/15 237 lb (107.502 kg)   General: Alert, oriented, no distress.  Skin: normal turgor, no rashes, warm and dry HEENT: Normocephalic, atraumatic. Pupils equal round and reactive to light; sclera anicteric; extraocular muscles intact, No lid lag; Nose without nasal septal hypertrophy; Mouth/Parynx benign; Mallinpatti scale 2 Neck: No JVD, no carotid bruits; normal carotid upstroke Lungs: clear to ausculatation and percussion bilaterally; no wheezing or rales, normal inspiratory and expiratory effort Chest wall: without tenderness to palpitation Heart: PMI not displaced, irregularly irregular rhythm with a controlled ventricular rate in the 60s, s1 s2 normal, 1/6systolic murmur, No diastolic murmur, no rubs, gallops, thrills, or heaves Abdomen: soft, nontender; no hepatosplenomehaly, BS+;  abdominal aorta nontender and not dilated by palpation. Back: no CVA tenderness Pulses: 2+  Musculoskeletal: full range of motion, normal strength, no joint deformities Extremities: Pulses 2+, no clubbing cyanosis or edema, Homan's sign negative  Neurologic: grossly nonfocal; Cranial nerves grossly wnl Psychologic: Normal mood and affect  ECG (independently read by me): Atrial fibrillation with a ventricular rate at 70 bpm.  One PVC.  June 2016 ECG (independently read by me): Atrial fibrillation at 67 bpm.  No significant ST-T changes.  May 2015 ECG (independently read by me): Atrial fibrillation at 67 beats per minute.  Rate control.  QTc interval 420 ms.  LABS:  BMP Latest Ref Rng 07/08/2015 04/20/2015 04/02/2014  Glucose 65 - 99 mg/dL 90 97 118(H)  BUN 8 - 27 mg/dL _0 Creatinine 0.76 - 1.27 mg/dL 0.84 0.91 0.78  BUN/Creat Ratio 10 - 22 23(H) 21 -  Sodium 136 - 144 mmol/L 139 143 140  Potassium 3.5 - 5.2 mmol/L 4.7 4.4 4.1  Chloride 97 - 106 mmol/L 102 108 102  CO2 18 - 29 mmol/L _1 Calcium 8.6 - 10.2 mg/dL 8.9 9.2 9.2   Hepatic Function Latest Ref Rng 07/08/2015 04/20/2015 04/01/2014  Total Protein 6.0 - 8.5 g/dL 6.1 6.7 6.9  Albumin 3.5 - 4.8 g/dL 4.1 4.5 3.5  AST 0 - 40 IU/L 24 22 46(H)  ALT 0 - 44 IU/L 23 22 66(H)  Alk Phosphatase 39 - 117 IU/L 69 95 116  Total Bilirubin 0.0 - 1.2 mg/dL 0.8 0.5 1.5(H)   CBC Latest Ref Rng 04/20/2015 04/01/2014 03/31/2014  WBC 3.4 - 10.8 x10E3/uL 7.0 9.6 10.0  Hemoglobin 13.0 - 17.0 g/dL - 12.9(L) 13.6  Hematocrit 37.5 - 51.0 % 41.9 38.1(L) 38.9(L)  Platelets 150 - 379 x10E3/uL 192 181 156   Lab Results  Component Value Date   MCV 94 04/20/2015   MCV 97.9 04/01/2014   MCV 96.5 03/31/2014   Lab Results  Component Value Date   TSH 2.220 04/20/2015   Lab Results  Component Value Date   HGBA1C 5.5 03/29/2014   Lipid Panel     Component Value Date/Time   CHOL 153 07/08/2015 1119   CHOL 163 01/13/2014 0737   TRIG 53  07/08/2015 1119   HDL 80 07/08/2015 1119   HDL 59 01/13/2014 0737   CHOLHDL 2.8 01/13/2014 0737   VLDL 17 01/13/2014 0737   LDLCALC 62 07/08/2015  [place] 01/13/2014 0737    RADIOLOGY: No results found.    ASSESSMENT AND PLAN: Leonard Cisneros is a 75 year old WM who is is 14 years status post CABG revascularization surgery by Dr. Roxy Manns and has bilateral arterial conduits to his LAD and RCA, as well as a vein graft to the intermediate vessel.  He has permanent atrial fibrillation and is on chronic Coumadin therapy.  He has a history of hypertension and has been on long-acting diltiazem at 240 mg, Toprol-XL 50 mg, and ramipril 2.5 mg daily.  He has not been on a statin but has been on Zetia 10 mg and his LDL in November 2016 was 62.  I reviewed his most recent nuclear stress test with him in detail.  This was done as a three-year follow-up surveillance evaluation.  At the time he was asymptomatic but developed 1-2 mm of horizontal ST segment depression and had normal scintigraphic images.  However, over the past month.  He has developed symptomatic exertionally precipitated chest tightness compatible with angina pectoris.  He denies any rest symptomatology or awareness of tachycardia dysrhythmias.  I am recommending he increase his beta blocker and will take Toprol-XL 75 mg daily.  I am also adding isosorbide mononitrate 30 mg.  It is my recommendation that he undergo definitive repeat cardiac catheterization in light of his change in symptomatology and suggestion of recurrent  progressive angina.  He will need to hold his Coumadin for at least 3-4 days prior to the procedure.  His catheterization will be scheduled for Wednesday, 02/01/2016, and he will hold his Coumadin on Sunday, Monday and Tuesday.  I have recommended he initiate a 81 mg aspirin. The risks and benefits of a cardiac catheterization including, but not limited to, death, stroke, MI, kidney damage and bleeding were discussed  with the patient who indicates understanding and agrees to proceed.  Laboratory will be obtained prior to his procedure.  Time spent: 40 minutes  [name], MD, [facility]  01/28/2016 1:19 PM

## 2016-01-30 LAB — COMPREHENSIVE METABOLIC PANEL
A/G RATIO: 1.9 (ref 1.2–2.2)
ALK PHOS: 74 IU/L (ref 39–117)
ALT: 22 IU/L (ref 0–44)
AST: 23 IU/L (ref 0–40)
Albumin: 4.3 g/dL (ref 3.5–4.8)
BILIRUBIN TOTAL: 0.8 mg/dL (ref 0.0–1.2)
BUN/Creatinine Ratio: 18 (ref 10–24)
BUN: 14 mg/dL (ref 8–27)
CHLORIDE: 106 mmol/L (ref 96–106)
CO2: 21 mmol/L (ref 18–29)
Calcium: 9.3 mg/dL (ref 8.6–10.2)
Creatinine, Ser: 0.8 mg/dL (ref 0.76–1.27)
GFR calc Af Amer: 102 mL/min/{1.73_m2} (ref >59)
GFR calc non Af Amer: 88 mL/min/{1.73_m2} (ref >59)
Globulin, Total: 2.3 g/dL (ref 1.5–4.5)
Glucose: 100 mg/dL — ABNORMAL HIGH (ref 65–99)
POTASSIUM: 4.9 mmol/L (ref 3.5–5.2)
Sodium: 144 mmol/L (ref 134–144)
Total Protein: 6.6 g/dL (ref 6.0–8.5)

## 2016-01-30 LAB — CBC, NO DIFFERENTIAL/PLATELET
Hematocrit: 39.4 % (ref 37.5–51.0)
Hemoglobin: 13.7 g/dL (ref 12.6–17.7)
MCH: 33.1 pg — AB (ref 26.6–33.0)
MCHC: 34.8 g/dL (ref 31.5–35.7)
MCV: 95 fL (ref 79–97)
RBC: 4.14 x10E6/uL (ref 4.14–5.80)
RDW: 14.6 % (ref 12.3–15.4)
WBC: 7.3 10*3/uL (ref 3.4–10.8)

## 2016-01-30 LAB — PROTIME-INR
INR: 3.2 — AB (ref 0.8–1.2)
Prothrombin Time: 32.4 s — ABNORMAL HIGH (ref 9.1–12.0)

## 2016-01-30 LAB — PTT FACTOR INHIBITOR (MIXING STUDY)
APTT 1 1 NORMAL PLASMA: 25.8 s (ref 22.9–30.2)
APTT 11 NP INCUB. MIX CTL: 29 s (ref 22.9–30.2)
APTT: 33.1 s — AB (ref 22.9–30.2)
aPTT 1:1 Mix Saline: 48.6 s
aPTT 1:1 NP Mix, 60 Min,Incub.: 27.2 s (ref 22.9–30.2)

## 2016-02-01 ENCOUNTER — Ambulatory Visit (HOSPITAL_COMMUNITY)
Admission: RE | Admit: 2016-02-01 | Discharge: 2016-02-02 | Disposition: A | Discharge status: Home / Self Care | Payer: Medicare Other | Source: Ambulatory Visit | Attending: Cardiovascular Disease | Admitting: Cardiovascular Disease

## 2016-02-01 ENCOUNTER — Encounter (HOSPITAL_COMMUNITY)
Admission: RE | Disposition: A | Discharge status: Home / Self Care | Payer: Self-pay | Source: Ambulatory Visit | Attending: Cardiovascular Disease

## 2016-02-01 ENCOUNTER — Encounter (HOSPITAL_COMMUNITY): Payer: Self-pay | Admitting: General Practice

## 2016-02-01 DIAGNOSIS — I10 Essential (primary) hypertension: Secondary | ICD-10-CM | POA: Diagnosis present

## 2016-02-01 DIAGNOSIS — Z955 Presence of coronary angioplasty implant and graft: Secondary | ICD-10-CM

## 2016-02-01 DIAGNOSIS — Z7982 Long term (current) use of aspirin: Secondary | ICD-10-CM | POA: Insufficient documentation

## 2016-02-01 DIAGNOSIS — Z87891 Personal history of nicotine dependence: Secondary | ICD-10-CM | POA: Insufficient documentation

## 2016-02-01 DIAGNOSIS — I482 Chronic atrial fibrillation, unspecified: Secondary | ICD-10-CM | POA: Diagnosis present

## 2016-02-01 DIAGNOSIS — M549 Dorsalgia, unspecified: Secondary | ICD-10-CM | POA: Insufficient documentation

## 2016-02-01 DIAGNOSIS — I25710 Atherosclerosis of autologous vein coronary artery bypass graft(s) with unstable angina pectoris: Secondary | ICD-10-CM | POA: Insufficient documentation

## 2016-02-01 DIAGNOSIS — Z6831 Body mass index (BMI) 31.0-31.9, adult: Secondary | ICD-10-CM | POA: Diagnosis not present

## 2016-02-01 DIAGNOSIS — Z7901 Long term (current) use of anticoagulants: Secondary | ICD-10-CM | POA: Insufficient documentation

## 2016-02-01 DIAGNOSIS — I255 Ischemic cardiomyopathy: Secondary | ICD-10-CM | POA: Diagnosis not present

## 2016-02-01 DIAGNOSIS — G8929 Other chronic pain: Secondary | ICD-10-CM | POA: Diagnosis not present

## 2016-02-01 DIAGNOSIS — E785 Hyperlipidemia, unspecified: Secondary | ICD-10-CM | POA: Diagnosis present

## 2016-02-01 DIAGNOSIS — G609 Hereditary and idiopathic neuropathy, unspecified: Secondary | ICD-10-CM | POA: Diagnosis not present

## 2016-02-01 DIAGNOSIS — I2510 Atherosclerotic heart disease of native coronary artery without angina pectoris: Secondary | ICD-10-CM | POA: Diagnosis present

## 2016-02-01 DIAGNOSIS — Z880 Allergy status to penicillin: Secondary | ICD-10-CM | POA: Diagnosis not present

## 2016-02-01 DIAGNOSIS — I509 Heart failure, unspecified: Secondary | ICD-10-CM | POA: Insufficient documentation

## 2016-02-01 DIAGNOSIS — I110 Hypertensive heart disease with heart failure: Secondary | ICD-10-CM | POA: Diagnosis not present

## 2016-02-01 DIAGNOSIS — I25700 Atherosclerosis of coronary artery bypass graft(s), unspecified, with unstable angina pectoris: Secondary | ICD-10-CM | POA: Diagnosis not present

## 2016-02-01 DIAGNOSIS — I2582 Chronic total occlusion of coronary artery: Secondary | ICD-10-CM | POA: Insufficient documentation

## 2016-02-01 DIAGNOSIS — M1990 Unspecified osteoarthritis, unspecified site: Secondary | ICD-10-CM | POA: Diagnosis not present

## 2016-02-01 DIAGNOSIS — Z01818 Encounter for other preprocedural examination: Secondary | ICD-10-CM

## 2016-02-01 DIAGNOSIS — Z9861 Coronary angioplasty status: Secondary | ICD-10-CM

## 2016-02-01 DIAGNOSIS — E669 Obesity, unspecified: Secondary | ICD-10-CM

## 2016-02-01 HISTORY — DX: Unspecified atrial flutter: I48.92

## 2016-02-01 HISTORY — DX: Ischemic cardiomyopathy: I25.5

## 2016-02-01 HISTORY — DX: Essential (primary) hypertension: I10

## 2016-02-01 HISTORY — DX: Low back pain, unspecified: M54.50

## 2016-02-01 HISTORY — DX: Other chronic pain: G89.29

## 2016-02-01 HISTORY — DX: Low back pain: M54.5

## 2016-02-01 HISTORY — DX: Pure hypercholesterolemia, unspecified: E78.00

## 2016-02-01 HISTORY — PX: CORONARY ANGIOPLASTY WITH STENT PLACEMENT: SHX49

## 2016-02-01 HISTORY — DX: Chronic atrial fibrillation, unspecified: I48.20

## 2016-02-01 HISTORY — PX: CARDIAC CATHETERIZATION: SHX172

## 2016-02-01 LAB — POCT ACTIVATED CLOTTING TIME: Activated Clotting Time: 441 seconds

## 2016-02-01 LAB — PLATELET COUNT: PLATELETS: 176 10*3/uL (ref 150–400)

## 2016-02-01 LAB — PROTIME-INR
INR: 1.3 (ref 0.00–1.49)
Prothrombin Time: 16.3 seconds — ABNORMAL HIGH (ref 11.6–15.2)

## 2016-02-01 SURGERY — LEFT HEART CATH AND CORS/GRAFTS ANGIOGRAPHY

## 2016-02-01 MED ORDER — SODIUM CHLORIDE 0.9 % IV SOLN
INTRAVENOUS | Status: DC
  Administered 2016-02-01: 10:00:00 via INTRAVENOUS

## 2016-02-01 MED ORDER — NITROGLYCERIN 1 MG/10 ML FOR IR/CATH LAB
INTRA_ARTERIAL | Status: DC | PRN
  Administered 2016-02-01: 200 ug via INTRACORONARY

## 2016-02-01 MED ORDER — FENTANYL CITRATE (PF) 100 MCG/2ML IJ SOLN
INTRAMUSCULAR | Status: AC
  Filled 2016-02-01: qty 2

## 2016-02-01 MED ORDER — IOPAMIDOL (ISOVUE-370) INJECTION 76%
INTRAVENOUS | Status: AC
  Filled 2016-02-01: qty 100

## 2016-02-01 MED ORDER — NITROGLYCERIN 1 MG/10 ML FOR IR/CATH LAB
INTRA_ARTERIAL | Status: AC
  Filled 2016-02-01: qty 10

## 2016-02-01 MED ORDER — WARFARIN SODIUM 7.5 MG PO TABS: 7.5000 mg | ORAL_TABLET | Freq: Every day | ORAL | Status: DC

## 2016-02-01 MED ORDER — CLOPIDOGREL BISULFATE 75 MG PO TABS
75.0000 mg | ORAL_TABLET | Freq: Every day | ORAL | Status: DC
  Administered 2016-02-02: 09:00:00 75 mg via ORAL
  Filled 2016-02-01: qty 1

## 2016-02-01 MED ORDER — HEPARIN (PORCINE) IN NACL 2-0.9 UNIT/ML-% IJ SOLN
INTRAMUSCULAR | Status: AC
  Filled 2016-02-01: qty 500

## 2016-02-01 MED ORDER — HEPARIN (PORCINE) IN NACL 2-0.9 UNIT/ML-% IJ SOLN
INTRAMUSCULAR | Status: AC
  Filled 2016-02-01: qty 1000

## 2016-02-01 MED ORDER — DIAZEPAM 5 MG PO TABS
5.0000 mg | ORAL_TABLET | ORAL | Status: AC
  Administered 2016-02-01: 5 mg via ORAL

## 2016-02-01 MED ORDER — ISOSORBIDE MONONITRATE ER 30 MG PO TB24
30.0000 mg | ORAL_TABLET | ORAL | Status: DC
  Administered 2016-02-02: 30 mg via ORAL
  Filled 2016-02-01: qty 1

## 2016-02-01 MED ORDER — DIAZEPAM 5 MG PO TABS: 5.0000 mg | ORAL_TABLET | ORAL | Status: DC | PRN

## 2016-02-01 MED ORDER — METOPROLOL SUCCINATE ER 50 MG PO TB24
75.0000 mg | ORAL_TABLET | ORAL | Status: DC
  Administered 2016-02-02: 75 mg via ORAL
  Filled 2016-02-01: qty 1

## 2016-02-01 MED ORDER — ASPIRIN EC 81 MG PO TBEC
81.0000 mg | DELAYED_RELEASE_TABLET | Freq: Every day | ORAL | Status: DC
  Administered 2016-02-02: 09:00:00 81 mg via ORAL
  Filled 2016-02-01: qty 1

## 2016-02-01 MED ORDER — DILTIAZEM HCL ER 240 MG PO CP24
240.0000 mg | ORAL_CAPSULE | Freq: Every day | ORAL | Status: DC
  Administered 2016-02-02: 240 mg via ORAL
  Filled 2016-02-01 (×2): qty 1

## 2016-02-01 MED ORDER — CLOPIDOGREL BISULFATE 300 MG PO TABS
ORAL_TABLET | ORAL | Status: DC | PRN
  Administered 2016-02-01: 600 mg via ORAL

## 2016-02-01 MED ORDER — MIDAZOLAM HCL 2 MG/2ML IJ SOLN
INTRAMUSCULAR | Status: DC | PRN
  Administered 2016-02-01: 1 mg via INTRAVENOUS
  Administered 2016-02-01: 2 mg via INTRAVENOUS
  Administered 2016-02-01: 1 mg via INTRAVENOUS

## 2016-02-01 MED ORDER — BIVALIRUDIN BOLUS VIA INFUSION - CUPID
INTRAVENOUS | Status: DC | PRN
  Administered 2016-02-01: 78.9 mg via INTRAVENOUS

## 2016-02-01 MED ORDER — LIDOCAINE HCL (PF) 1 % IJ SOLN
INTRAMUSCULAR | Status: AC
  Filled 2016-02-01: qty 30

## 2016-02-01 MED ORDER — FENTANYL CITRATE (PF) 100 MCG/2ML IJ SOLN
INTRAMUSCULAR | Status: DC | PRN
  Administered 2016-02-01 (×2): 25 ug via INTRAVENOUS
  Administered 2016-02-01: 50 ug via INTRAVENOUS

## 2016-02-01 MED ORDER — ASPIRIN EC 81 MG PO TBEC: 81.0000 mg | DELAYED_RELEASE_TABLET | Freq: Every day | ORAL | Status: DC

## 2016-02-01 MED ORDER — NITROGLYCERIN 0.4 MG SL SUBL: 0.4000 mg | SUBLINGUAL_TABLET | SUBLINGUAL | Status: DC | PRN

## 2016-02-01 MED ORDER — WARFARIN - PHYSICIAN DOSING INPATIENT: Freq: Every day | Status: DC

## 2016-02-01 MED ORDER — RAMIPRIL 2.5 MG PO CAPS
2.5000 mg | ORAL_CAPSULE | Freq: Every day | ORAL | Status: DC
  Administered 2016-02-02: 2.5 mg via ORAL
  Filled 2016-02-01: qty 1

## 2016-02-01 MED ORDER — IOPAMIDOL (ISOVUE-370) INJECTION 76%
INTRAVENOUS | Status: DC | PRN
  Administered 2016-02-01: 290 mL via INTRAVENOUS

## 2016-02-01 MED ORDER — WARFARIN SODIUM 5 MG PO TABS
5.0000 mg | ORAL_TABLET | Freq: Once | ORAL | Status: AC
  Administered 2016-02-01: 21:00:00 5 mg via ORAL
  Filled 2016-02-01: qty 1

## 2016-02-01 MED ORDER — SODIUM CHLORIDE 0.9% FLUSH
3.0000 mL | Freq: Two times a day (BID) | INTRAVENOUS | Status: DC
  Administered 2016-02-01: 21:00:00 3 mL via INTRAVENOUS

## 2016-02-01 MED ORDER — HEPARIN (PORCINE) IN NACL 2-0.9 UNIT/ML-% IJ SOLN
INTRAMUSCULAR | Status: DC | PRN
  Administered 2016-02-01: 1500 mL

## 2016-02-01 MED ORDER — ASPIRIN EC 81 MG PO TBEC: 81.0000 mg | DELAYED_RELEASE_TABLET | ORAL | Status: DC

## 2016-02-01 MED ORDER — SODIUM CHLORIDE 0.9% FLUSH: 3.0000 mL | INTRAVENOUS | Status: DC | PRN

## 2016-02-01 MED ORDER — SODIUM CHLORIDE 0.9% FLUSH: 3.0000 mL | Freq: Two times a day (BID) | INTRAVENOUS | Status: DC

## 2016-02-01 MED ORDER — CLOPIDOGREL BISULFATE 300 MG PO TABS
ORAL_TABLET | ORAL | Status: AC
  Filled 2016-02-01: qty 2

## 2016-02-01 MED ORDER — SODIUM CHLORIDE 0.9 % IV SOLN
250.0000 mg | INTRAVENOUS | Status: DC | PRN
  Administered 2016-02-01: 1.75 mg/kg/h via INTRAVENOUS

## 2016-02-01 MED ORDER — DIAZEPAM 5 MG PO TABS
ORAL_TABLET | ORAL | Status: AC
  Administered 2016-02-01: 5 mg via ORAL
  Filled 2016-02-01: qty 1

## 2016-02-01 MED ORDER — SODIUM CHLORIDE 0.9 % IV SOLN: 250.0000 mL | INTRAVENOUS | Status: DC | PRN

## 2016-02-01 MED ORDER — SODIUM CHLORIDE 0.9 % IV SOLN
INTRAVENOUS | Status: DC
  Administered 2016-02-01: 16:00:00 via INTRAVENOUS

## 2016-02-01 MED ORDER — LIDOCAINE HCL (PF) 1 % IJ SOLN
INTRAMUSCULAR | Status: DC | PRN
  Administered 2016-02-01: 20 mL

## 2016-02-01 MED ORDER — SODIUM CHLORIDE 0.9% FLUSH
3.0000 mL | INTRAVENOUS | Status: DC | PRN
Start: 2016-02-01 — End: 2016-02-02

## 2016-02-01 MED ORDER — MIDAZOLAM HCL 2 MG/2ML IJ SOLN
INTRAMUSCULAR | Status: AC
  Filled 2016-02-01: qty 2

## 2016-02-01 MED ORDER — HEPARIN SODIUM (PORCINE) 1000 UNIT/ML IJ SOLN
INTRAMUSCULAR | Status: AC
  Filled 2016-02-01: qty 1

## 2016-02-01 MED ORDER — ACETAMINOPHEN 325 MG PO TABS: 650.0000 mg | ORAL_TABLET | ORAL | Status: DC | PRN

## 2016-02-01 MED ORDER — ONDANSETRON HCL 4 MG/2ML IJ SOLN: 4.0000 mg | Freq: Four times a day (QID) | INTRAMUSCULAR | Status: DC | PRN

## 2016-02-01 MED ORDER — EZETIMIBE 10 MG PO TABS
10.0000 mg | ORAL_TABLET | ORAL | Status: DC
  Administered 2016-02-02: 10 mg via ORAL
  Filled 2016-02-01: qty 1

## 2016-02-01 MED ORDER — ATORVASTATIN CALCIUM 80 MG PO TABS
80.0000 mg | ORAL_TABLET | Freq: Every day | ORAL | Status: DC
  Filled 2016-02-01: qty 1

## 2016-02-01 MED ORDER — BIVALIRUDIN 250 MG IV SOLR
INTRAVENOUS | Status: AC
  Filled 2016-02-01: qty 250

## 2016-02-01 SURGICAL SUPPLY — 24 items
BALLN EUPHORA RX 2.0X10 (BALLOONS) ×2
BALLN EUPHORA RX 2.25X10 (BALLOONS) ×2 IMPLANT
BALLN ~~LOC~~ EMERGE MR 2.75X8 (BALLOONS) ×2
BALLOON EUPHORA RX 2.0X10 (BALLOONS) ×1 IMPLANT
BALLOON ~~LOC~~ EMERGE MR 2.75X8 (BALLOONS) ×1 IMPLANT
CATH INFINITI 5 FR 3DRC (CATHETERS) ×2 IMPLANT
CATH INFINITI 5 FR IM (CATHETERS) ×2 IMPLANT
CATH INFINITI 5 FR LCB (CATHETERS) ×2 IMPLANT
CATH INFINITI 5FR MULTPACK ANG (CATHETERS) ×2 IMPLANT
CATH VISTA GUIDE 6FR LCB (CATHETERS) ×2 IMPLANT
GUIDEWIRE ANGLED .035X260CM (WIRE) ×2 IMPLANT
KIT ENCORE 26 ADVANTAGE (KITS) ×2 IMPLANT
KIT HEART LEFT (KITS) ×2 IMPLANT
PACK CARDIAC CATHETERIZATION (CUSTOM PROCEDURE TRAY) ×2 IMPLANT
SHEATH PINNACLE 5F 10CM (SHEATH) ×2 IMPLANT
SHEATH PINNACLE 6F 10CM (SHEATH) ×2 IMPLANT
STENT NIRXCELL 2.5X12 (Permanent Stent) ×2 IMPLANT
SYR MEDRAD MARK V 150ML (SYRINGE) ×2 IMPLANT
TRANSDUCER W/STOPCOCK (MISCELLANEOUS) ×2 IMPLANT
TUBING CIL FLEX 10 FLL-RA (TUBING) ×2 IMPLANT
WIRE COUGAR XT STRL 190CM (WIRE) ×2 IMPLANT
WIRE EMERALD 3MM-J .035X150CM (WIRE) ×2 IMPLANT
WIRE EMERALD 3MM-J .035X260CM (WIRE) ×2 IMPLANT
WIRE HI TORQ VERSACORE J 260CM (WIRE) ×2 IMPLANT

## 2016-02-01 NOTE — H&P (View-Only) (Signed)
Patient ID: Leonard Cisneros, male   DOB: 05-13-1941, 75 y.o.   MRN: [id number]     Primary M.D.: Dr. [name]  HPI: Leonard Cisneros is a 75 y.o. male who presents to the office today for a 9 month follow up cardiology evaluation.  Chief complaint is that of recurrent episodes of exertionally precipitated chest pain.  Leonard Cisneros has CAD and in 2003 underwent CABG surgery by Dr. Roxy Manns with LIMA to the LAD, RIMA to the distal RCA, and vein graft to his ramus intermediate vessel.  Initially, he had postoperative atrial fibrillation, which was controlled with Rythmol.  He is status post atrial flutter ablation by Dr. [name].  Over the last several years he has been in permanent atrial fibrillation and has been on warfarin anticoagulation.  His last nuclear perfusion study in March 2014 was essentially unchanged from February 2012 and did not reveal significant scar or ischemia.  He has a history of her left hip surgery with revision.  When I saw him last year he was exercising regularly, typically 6 days per week.   He denied any associated chest pressure.  On 12/07/2015.  He underwent a nuclear stress test for three-year follow-up evaluation.  At that time, he still remained asymptomatic.  On the stress test he developed 1-2 mm of asymptomatic horizontal ST segment depression in the inferolateral leads at peak stress, and had a hypertensive blood pressure response to exercise.  Scintigraphic images continues to show normal perfusion.  However, over the past month he has started to notice that when he pushes his lawnmower.  He has to stop multiple times to cut the grass because of development of a chest pressure and shortness of breath.  In addition, he has noticed that even with walking without pushing a lawnmower.  He has developed chest discomfort described as tightness and shortness of breath.  He denies any symptoms which occur at rest.  He presents for evaluation.   Past Medical History    Diagnosis Date  . Atrial fibrillation (HCC)     chronic coumadin  . CAD (coronary artery disease)   . CHF (congestive heart failure) ([place])   . Hypertension   . Dysrhythmia     afib  . Arthritis     knee left  . Unspecified hereditary and idiopathic peripheral neuropathy 04/04/2014    Past Surgical History  Procedure Laterality Date  . Colonoscopy  06/04/2002    Normal colon and rectum  . Hip surgery      x4  both hips  . Back surgery    . Rotator cuff repair    . Coronary artery bypass graft  2003    LIMA to LAD,LIMA to distal RCA,SVG to ramus intermediate vessel.  . 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 Colonoscopy  07/08/2012    Procedure: COLONOSCOPY;  Surgeon: [name], MD;  Location: [facility];  Service: Endoscopy;  Laterality: N/A;  11:30  . US echocardiography  11/13/2011    LV mildly dilated,mild concentric LVH,LA mod - severely dilated,RA mildly dilated,mild to mod. 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  . Foreign body removal Right 04/02/2014    Procedure: FOREIGN BODY REMOVAL RIGHT FOOT;  Surgeon: [name], MD;  Location: [facility];  Service: General;  Laterality: Right;    Allergies  Allergen Reactions  .  Augmentin [Amoxicillin-Pot Clavulanate] Nausea And Vomiting    Current Outpatient Prescriptions  Medication Sig Dispense Refill  . ezetimibe (ZETIA) 10 MG tablet Take 1 tablet (10 mg total) by mouth daily. (Patient taking differently: Take 10 mg by mouth every morning. ) 90 tablet 0  . metoprolol succinate (TOPROL-XL) 50 MG 24 hr tablet Take 1.5 tablets daily ( 75 mg). (Patient taking differently: Take 75 mg by mouth every morning. ) 90 tablet 0  . oxyCODONE-acetaminophen (PERCOCET/ROXICET) 5-325 MG per tablet Take 1 tablet by mouth every 4 (four) hours as needed for moderate pain or severe pain.    [name] warfarin  (COUMADIN) 7.5 MG tablet Take 1 tablet (7.5 mg total) by mouth daily. (Patient taking differently: Take 7.5 mg by mouth every morning. ) 90 tablet 0  . aspirin EC 81 MG tablet Take 1 tablet (81 mg total) by mouth daily. (Patient taking differently: Take 81 mg by mouth every morning. ) 90 tablet 3  . diltiazem (DILACOR XR) 240 MG 24 hr capsule TAKE 1 CAPSULE BY MOUTH ONCE DAILY. 30 capsule 2  . ibuprofen (ADVIL,MOTRIN) 200 MG tablet Take 400 mg by mouth 3 (three) times daily as needed for moderate pain.    . isosorbide mononitrate (IMDUR) 30 MG 24 hr tablet Take 1 tablet (30 mg total) by mouth daily. (Patient taking differently: Take 30 mg by mouth every morning. ) 30 tablet 3  . nitroGLYCERIN (NITROSTAT) 0.4 MG SL tablet Place 1 tablet (0.4 mg total) under the tongue every 5 (five) minutes as needed for chest pain. 25 tablet 3  . ramipril (ALTACE) 2.5 MG capsule TAKE 1 CAPSULE BY MOUTH ONCE DAILY. 30 capsule 3   No current facility-administered medications for this visit.    Social History   Social History  . Marital Status: Married    Spouse Name: N/A  . Number of Children: 2  . Years of Education: N/A   Occupational History  . 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 Smoking status: Former Smoker -- 0.50 packs/day for 2 years    Types: Cigarettes    Quit date: 09/03/1966  . Smokeless tobacco: Not on file     Comment: Quit about 40 years ago  . Alcohol Use: 0.0 oz/week    0 Standard drinks or equivalent per week     Comment: a glass of wine with dinner/ beer on the weekend  . Drug Use: No  . Sexual Activity: Not on file   Other Topics Concern  . Not on file   Social History Narrative    Family History  Problem Relation Age of Onset  . Colon cancer Neg Hx   . Heart attack Brother     ROS General: Negative; No fevers, chills, or night sweats HEENT: Negative; No changes in vision or hearing, sinus congestion, difficulty swallowing Pulmonary: Negative;  No cough, wheezing, shortness of breath, hemoptysis Cardiovascular:   See HPI;positive for CABG revascularization surgery, positive for atrial flutter ablation, positive for permanent atrial fibrillation;  GI: Negative; No nausea, vomiting, diarrhea, or abdominal pain GU: Negative; No dysuria, hematuria, or difficulty voiding Musculoskeletal: Positive for left hip surgery with revision; no myalgias, joint pain, or weakness Hematologic: Negative; no easy bruising, bleeding Endocrine: Negative; no heat/cold intolerance; no diabetes Neuro: Negative; no changes in balance, headaches Skin: Negative; No rashes or skin lesions Psychiatric: Negative; No behavioral problems, depression Sleep: Negative; No snoring,  daytime sleepiness, hypersomnolence, bruxism, restless legs, hypnogognic hallucinations. Other  comprehensive 14 point system review is negative   Physical Exam BP 127/78 mmHg  Pulse 75  Ht 6' (1.829 m)  Wt 235 lb 9.6 oz (106.867 kg)  BMI 31.95 kg/m2   Repeat blood pressure by me was 158/78.  Wt Readings from Last 3 Encounters:  01/26/16 235 lb 9.6 oz (106.867 kg)  12/07/15 237 lb (107.502 kg)  11/30/15 237 lb (107.502 kg)   General: Alert, oriented, no distress.  Skin: normal turgor, no rashes, warm and dry HEENT: Normocephalic, atraumatic. Pupils equal round and reactive to light; sclera anicteric; extraocular muscles intact, No lid lag; Nose without nasal septal hypertrophy; Mouth/Parynx benign; Mallinpatti scale 2 Neck: No JVD, no carotid bruits; normal carotid upstroke Lungs: clear to ausculatation and percussion bilaterally; no wheezing or rales, normal inspiratory and expiratory effort Chest wall: without tenderness to palpitation Heart: PMI not displaced, irregularly irregular rhythm with a controlled ventricular rate in the 60s, s1 s2 normal, 1/6systolic murmur, No diastolic murmur, no rubs, gallops, thrills, or heaves Abdomen: soft, nontender; no hepatosplenomehaly, BS+;  abdominal aorta nontender and not dilated by palpation. Back: no CVA tenderness Pulses: 2+  Musculoskeletal: full range of motion, normal strength, no joint deformities Extremities: Pulses 2+, no clubbing cyanosis or edema, Homan's sign negative  Neurologic: grossly nonfocal; Cranial nerves grossly wnl Psychologic: Normal mood and affect  ECG (independently read by me): Atrial fibrillation with a ventricular rate at 70 bpm.  One PVC.  June 2016 ECG (independently read by me): Atrial fibrillation at 67 bpm.  No significant ST-T changes.  May 2015 ECG (independently read by me): Atrial fibrillation at 67 beats per minute.  Rate control.  QTc interval 420 ms.  LABS:  BMP Latest Ref Rng 07/08/2015 04/20/2015 04/02/2014  Glucose 65 - 99 mg/dL 90 97 118(H)  BUN 8 - 27 mg/dL _0 Creatinine 0.76 - 1.27 mg/dL 0.84 0.91 0.78  BUN/Creat Ratio 10 - 22 23(H) 21 -  Sodium 136 - 144 mmol/L 139 143 140  Potassium 3.5 - 5.2 mmol/L 4.7 4.4 4.1  Chloride 97 - 106 mmol/L 102 108 102  CO2 18 - 29 mmol/L _1 Calcium 8.6 - 10.2 mg/dL 8.9 9.2 9.2   Hepatic Function Latest Ref Rng 07/08/2015 04/20/2015 04/01/2014  Total Protein 6.0 - 8.5 g/dL 6.1 6.7 6.9  Albumin 3.5 - 4.8 g/dL 4.1 4.5 3.5  AST 0 - 40 IU/L 24 22 46(H)  ALT 0 - 44 IU/L 23 22 66(H)  Alk Phosphatase 39 - 117 IU/L 69 95 116  Total Bilirubin 0.0 - 1.2 mg/dL 0.8 0.5 1.5(H)   CBC Latest Ref Rng 04/20/2015 04/01/2014 03/31/2014  WBC 3.4 - 10.8 x10E3/uL 7.0 9.6 10.0  Hemoglobin 13.0 - 17.0 g/dL - 12.9(L) 13.6  Hematocrit 37.5 - 51.0 % 41.9 38.1(L) 38.9(L)  Platelets 150 - 379 x10E3/uL 192 181 156   Lab Results  Component Value Date   MCV 94 04/20/2015   MCV 97.9 04/01/2014   MCV 96.5 03/31/2014   Lab Results  Component Value Date   TSH 2.220 04/20/2015   Lab Results  Component Value Date   HGBA1C 5.5 03/29/2014   Lipid Panel     Component Value Date/Time   CHOL 153 07/08/2015 1119   CHOL 163 01/13/2014 0737   TRIG 53  07/08/2015 1119   HDL 80 07/08/2015 1119   HDL 59 01/13/2014 0737   CHOLHDL 2.8 01/13/2014 0737   VLDL 17 01/13/2014 0737   LDLCALC 62 07/08/2015  [place] 01/13/2014 0737    RADIOLOGY: No results found.    ASSESSMENT AND PLAN: Leonard Cisneros is a 75 year old WM who is is 14 years status post CABG revascularization surgery by Dr. Roxy Manns and has bilateral arterial conduits to his LAD and RCA, as well as a vein graft to the intermediate vessel.  He has permanent atrial fibrillation and is on chronic Coumadin therapy.  He has a history of hypertension and has been on long-acting diltiazem at 240 mg, Toprol-XL 50 mg, and ramipril 2.5 mg daily.  He has not been on a statin but has been on Zetia 10 mg and his LDL in November 2016 was 62.  I reviewed his most recent nuclear stress test with him in detail.  This was done as a three-year follow-up surveillance evaluation.  At the time he was asymptomatic but developed 1-2 mm of horizontal ST segment depression and had normal scintigraphic images.  However, over the past month.  He has developed symptomatic exertionally precipitated chest tightness compatible with angina pectoris.  He denies any rest symptomatology or awareness of tachycardia dysrhythmias.  I am recommending he increase his beta blocker and will take Toprol-XL 75 mg daily.  I am also adding isosorbide mononitrate 30 mg.  It is my recommendation that he undergo definitive repeat cardiac catheterization in light of his change in symptomatology and suggestion of recurrent  progressive angina.  He will need to hold his Coumadin for at least 3-4 days prior to the procedure.  His catheterization will be scheduled for Wednesday, 02/01/2016, and he will hold his Coumadin on Sunday, Monday and Tuesday.  I have recommended he initiate a 81 mg aspirin. The risks and benefits of a cardiac catheterization including, but not limited to, death, stroke, MI, kidney damage and bleeding were discussed  with the patient who indicates understanding and agrees to proceed.  Laboratory will be obtained prior to his procedure.  Time spent: 40 minutes  [name], MD, [facility]  01/28/2016 1:19 PM

## 2016-02-01 NOTE — Interval H&P Note (Signed)
Cath Lab Visit (complete for each Cath Lab visit)  Clinical Evaluation Leading to the Procedure:   ACS: No.  Non-ACS:    Anginal Classification: CCS III  Anti-ischemic medical therapy: Maximal Therapy (2 or more classes of medications)  Non-Invasive Test Results: No non-invasive testing performed  Prior CABG: Previous CABG      History and Physical Interval Note:  02/01/2016 12:34 PM  [name]  has presented today for surgery, with the diagnosis of cad  The various methods of treatment have been discussed with the patient and family. After consideration of risks, benefits and other options for treatment, the patient has consented to  Procedure(s): Left Heart Cath and Coronary Angiography (N/A) as a surgical intervention .  The patient's history has been reviewed, patient examined, no change in status, stable for surgery.  I have reviewed the patient's chart and labs.  Questions were answered to the patient's satisfaction.     [name]

## 2016-02-01 NOTE — Care Management Note (Signed)
Case Management Note  Patient Details  Name: KYRYN BONNICI MRN: [id number] Date of Birth: April 12, 1941  Subjective/Objective:     Patient lives with wife at home, indep pta with cane.  Will be on plavix.  NCM will cont to follow for dc needs.               Action/Plan:   Expected Discharge Date:                  Expected Discharge Plan:  Home/Self Care  In-House Referral:     Discharge planning Services  CM Consult  Post Acute Care Choice:    Choice offered to:     DME Arranged:    DME Agency:     HH Arranged:    [place] Agency:     Status of Service:  Completed, signed off  Medicare Important Message Given:    Date Medicare IM Given:    Medicare IM give by:    Date Additional Medicare IM Given:    Additional Medicare Important Message give by:     If discussed at [place] of Stay Meetings, dates discussed:    Additional Comments:  Zenon Mayo, RN 02/01/2016, 6:09 PM

## 2016-02-01 NOTE — Research (Signed)
[facility] Research Study Informed Consent   Subject Name: Leonard Cisneros  Subject met inclusion and exclusion criteria.  The informed consent form, study requirements and expectations were reviewed with the subject and questions and concerns were addressed prior to the signing of the consent form.  The subject verbalized understanding of the trial requirements.  The subject agreed to participate in the trial and signed the informed consent.  The informed consent was obtained prior to performance of any protocol-specific procedures for the subject.  A copy of the signed informed consent was given to the subject and a copy was placed in the subject's medical record.    Blossom Hoops 02/01/2016, 2:37 PM

## 2016-02-01 NOTE — Progress Notes (Signed)
Site area: right groin  Site Prior to Removal:  Level 0  Pressure Applied For 20 MINUTES    Minutes Beginning at 1740  Manual:   Yes.    Patient Status During Pull:  Stable   Post Pull Groin Site:  Level 0  Post Pull Instructions Given:  Yes.    Post Pull Pulses Present:  Yes.    Dressing Applied:  Yes.    Comments:  Rechecked at 1830 with no change in assessment, dressing dry and intact, palpable pedal pulse.

## 2016-02-02 ENCOUNTER — Encounter (HOSPITAL_COMMUNITY): Payer: Self-pay | Admitting: Cardiovascular Disease

## 2016-02-02 DIAGNOSIS — I509 Heart failure, unspecified: Secondary | ICD-10-CM | POA: Diagnosis not present

## 2016-02-02 DIAGNOSIS — Z87891 Personal history of nicotine dependence: Secondary | ICD-10-CM | POA: Diagnosis not present

## 2016-02-02 DIAGNOSIS — I25700 Atherosclerosis of coronary artery bypass graft(s), unspecified, with unstable angina pectoris: Secondary | ICD-10-CM | POA: Diagnosis not present

## 2016-02-02 DIAGNOSIS — I482 Chronic atrial fibrillation: Secondary | ICD-10-CM

## 2016-02-02 DIAGNOSIS — M549 Dorsalgia, unspecified: Secondary | ICD-10-CM | POA: Diagnosis not present

## 2016-02-02 DIAGNOSIS — I255 Ischemic cardiomyopathy: Secondary | ICD-10-CM

## 2016-02-02 DIAGNOSIS — M1990 Unspecified osteoarthritis, unspecified site: Secondary | ICD-10-CM | POA: Diagnosis not present

## 2016-02-02 DIAGNOSIS — G8929 Other chronic pain: Secondary | ICD-10-CM | POA: Diagnosis not present

## 2016-02-02 DIAGNOSIS — Z7982 Long term (current) use of aspirin: Secondary | ICD-10-CM | POA: Diagnosis not present

## 2016-02-02 DIAGNOSIS — I2582 Chronic total occlusion of coronary artery: Secondary | ICD-10-CM | POA: Diagnosis not present

## 2016-02-02 DIAGNOSIS — I25710 Atherosclerosis of autologous vein coronary artery bypass graft(s) with unstable angina pectoris: Secondary | ICD-10-CM | POA: Diagnosis not present

## 2016-02-02 DIAGNOSIS — E785 Hyperlipidemia, unspecified: Secondary | ICD-10-CM | POA: Diagnosis not present

## 2016-02-02 DIAGNOSIS — E669 Obesity, unspecified: Secondary | ICD-10-CM

## 2016-02-02 DIAGNOSIS — Z880 Allergy status to penicillin: Secondary | ICD-10-CM | POA: Diagnosis not present

## 2016-02-02 DIAGNOSIS — I110 Hypertensive heart disease with heart failure: Secondary | ICD-10-CM | POA: Diagnosis not present

## 2016-02-02 DIAGNOSIS — Z7901 Long term (current) use of anticoagulants: Secondary | ICD-10-CM | POA: Diagnosis not present

## 2016-02-02 LAB — CBC
HEMATOCRIT: 39.4 % (ref 39.0–52.0)
Hemoglobin: 12.8 g/dL — ABNORMAL LOW (ref 13.0–17.0)
MCH: 31.8 pg (ref 26.0–34.0)
MCHC: 32.5 g/dL (ref 30.0–36.0)
MCV: 97.8 fL (ref 78.0–100.0)
Platelets: 166 10*3/uL (ref 150–400)
RBC: 4.03 MIL/uL — ABNORMAL LOW (ref 4.22–5.81)
RDW: 13.7 % (ref 11.5–15.5)
WBC: 6.9 10*3/uL (ref 4.0–10.5)

## 2016-02-02 LAB — MAGNESIUM: Magnesium: 2 mg/dL (ref 1.7–2.4)

## 2016-02-02 LAB — BASIC METABOLIC PANEL
Anion gap: 6 (ref 5–15)
BUN: 11 mg/dL (ref 6–20)
CALCIUM: 9.1 mg/dL (ref 8.9–10.3)
CO2: 24 mmol/L (ref 22–32)
Chloride: 109 mmol/L (ref 101–111)
Creatinine, Ser: 0.86 mg/dL (ref 0.61–1.24)
GFR calc Af Amer: >60 mL/min (ref >60)
GLUCOSE: 102 mg/dL — AB (ref 65–99)
Potassium: 4.8 mmol/L (ref 3.5–5.1)
Sodium: 139 mmol/L (ref 135–145)

## 2016-02-02 LAB — PROTIME-INR
INR: 1.22 (ref 0.00–1.49)
PROTHROMBIN TIME: 15.6 s — AB (ref 11.6–15.2)

## 2016-02-02 MED ORDER — CLOPIDOGREL BISULFATE 75 MG PO TABS: 75.0000 mg | ORAL_TABLET | Freq: Every day | ORAL | Status: DC

## 2016-02-02 MED ORDER — ANGIOPLASTY BOOK
Freq: Once | Status: AC
  Administered 2016-02-02: 05:00:00
  Filled 2016-02-02: qty 1

## 2016-02-02 MED ORDER — ATORVASTATIN CALCIUM 80 MG PO TABS: 80.0000 mg | ORAL_TABLET | Freq: Every evening | ORAL | Status: DC

## 2016-02-02 NOTE — Progress Notes (Signed)
Patient: Leonard Cisneros / Admit Date: 02/01/2016 / Date of Encounter: 02/02/2016, 8:36 AM   Subjective: Feeling great. No CP or SOB.   Objective: Telemetry: chronic afib - HR variable, mid 40s yesterday PM transiently, 60s-90s this AM Physical Exam: Blood pressure 158/70, pulse 100, temperature 97.7 F (36.5 C), temperature source Oral, resp. rate 23, height 6' (1.829 m), weight 231 lb 7.7 oz (105 kg), SpO2 96 %.  Body mass index is 31.39 kg/(m^2). General: Well developed, well nourished WM, in no acute distress. Head: Normocephalic, atraumatic, sclera non-icteric, no xanthomas, nares are without discharge. Neck: Negative for carotid bruits. JVP not elevated. Lungs: Clear bilaterally to auscultation without wheezes, rales, or rhonchi. Breathing is unlabored. Heart: Irregularly irregular S1 S2 without murmurs, rubs, or gallops.  Abdomen: Soft, non-tender, non-distended with normoactive bowel sounds. No rebound/guarding. Extremities: No clubbing or cyanosis. No edema. Distal pedal pulses are 2+ and equal bilaterally. Right Groin cath site without hematoma, ecchymosis, or bruit. Neuro: Alert and oriented X 3. Moves all extremities spontaneously. Psych:  Responds to questions appropriately with a normal affect.   Intake/Output Summary (Last 24 hours) at 02/02/16 0836 Last data filed at 02/02/16 0620  Gross per 24 hour  Intake  487.5 ml  Output   1800 ml  Net -1312.5 ml    Inpatient Medications:  . aspirin EC  81 mg Oral Daily  . atorvastatin  80 mg Oral q1800  . clopidogrel  75 mg Oral Q breakfast  . diltiazem  240 mg Oral Daily  . ezetimibe  10 mg Oral BH-q7a  . isosorbide mononitrate  30 mg Oral BH-q7a  . metoprolol succinate  75 mg Oral BH-q7a  . ramipril  2.5 mg Oral Daily  . sodium chloride flush  3 mL Intravenous Q12H  . warfarin  7.5 mg Oral q1800  . Warfarin - Physician Dosing Inpatient   Does not apply q1800   Infusions:  . sodium chloride 150 mL/hr at 02/01/16 1545      Labs:  Recent Labs  02/02/16 0602  NA 139  K 4.8  CL 109  CO2 24  GLUCOSE 102*  BUN 11  CREATININE 0.86  CALCIUM 9.1  MG 2.0   No results for input(s): AST, ALT, ALKPHOS, BILITOT, PROT, ALBUMIN in the last 72 hours.  Recent Labs  02/01/16 1019 02/02/16 0602  WBC  --  6.9  HGB  --  12.8*  HCT  --  39.4  MCV  --  97.8  PLT 176 166    Radiology/Studies:  Dg Chest 2 View  01/26/2016  CLINICAL DATA:  Chest pain. EXAM: CHEST  2 VIEW COMPARISON:  04/01/2014. FINDINGS: The heart is enlarged. Previous CABG has been performed. Calcified tortuous aorta. There is mild vascular congestion but no focal infiltrates or congestive failure. No acute osseous findings. IMPRESSION: No active cardiopulmonary disease. Electronically Signed   By: Staci Righter M.D.   On: 01/26/2016 15:11     Assessment and Plan  88M with CAD s/p CABG 2003, post-op AF after CABG which later became permanent afib (on Coumadin), atrial flutter ablation, HTN presented for elective cath due to recurrent angina.  1. CAD s/p CABG 2003 with recent angina - s/p BMS to SVG-ramus intermediate, patent LIMA-mLAD, patent RIMA-dRCA. Per cath notes, triple therapy with ASA/Plavix/Coumadin x 1 month then transition to dual therapy. Per d/w Dr. Claiborne Billings, stop Plavix after 1 month. Continue BB, statin, zetia.  2. Chronic atrial fib - continue rate control and anticoagulation.  3. Essential HTN - BP variable. Follow at home.  4. ICM EF 50-55% with mild distal inferior hypocontractility - continue ACEI.  Signed, Melina Copa PA-C Pager: [phone]   Patient seen and examined. Agree with assessment and plan. Feels well; no recurrent chest pain.  Received coumadin 5 mg last night;  Resume home dose today.  Groin site stable.  DC today. Triple therapy x 1 month; then with BMS can dc plavix and continue with ASA/Coumadin.   [name], MD, [facility] 02/02/2016 8:51 AM

## 2016-02-02 NOTE — Progress Notes (Signed)
CARDIAC REHAB PHASE I   PRE:  Rate/Rhythm: 88 afib  BP:  Sitting: 115/68        SaO2: 96 RA  MODE:  Ambulation: 500 ft   POST:  Rate/Rhythm: 113 afib  BP:  Sitting: 158/70         SaO2: 97 RA  Pt ambulated 500 ft on RA, handheld assist, steady gait, tolerated well.  Pt c/o mild DOE, chronic back pain to which he attributes his shortness of breath, denies any other complaints, declined rest stop. Completed PCI/stent education.  Reviewed risk factors, stent book, anti-platelet therapy, stent card, activity restrictions, ntg, exercise, heart healthy diet,  portion control, and phase 2 cardiac rehab. Pt verbalized understanding, receptive to education. Pt agrees to phase 2 cardiac rehab referral, will send to [facility] per pt request. Pt to recliner after walk, call bell within reach.   [phone]  Lenna Sciara, RN, BSN 02/02/2016 8:44 AM

## 2016-02-02 NOTE — Discharge Summary (Signed)
Discharge Summary    Patient ID: Leonard Cisneros,  MRN: [id number], DOB/AGE: 75/13/1942 75 y.o.  Admit date: 02/01/2016 Discharge date: 02/02/2016  Primary Care Provider: Mickie Hillier Primary Cardiologist: Claiborne Billings  Discharge Diagnoses    Principal Problem:   Coronary artery disease involving coronary bypass graft of native heart with unstable angina pectoris [facility]) Active Problems:   Chronic atrial fibrillation ([place])   Essential hypertension   Hyperlipidemia LDL goal <70   CAD (coronary artery disease) of artery bypass graft   Cardiomyopathy, ischemic   Obesity   Allergies Allergies  Allergen Reactions  . Augmentin [Amoxicillin-Pot Clavulanate] Nausea And Vomiting    Diagnostic Studies/Procedures    Cardiac catheterization this admission, please see full report and below for summary. _____________   Hospital Course     Consultants: N/A   Leonard Cisneros is a 75M with CAD s/p CABG 2003, post-op AF after CABG which later became permanent afib (on Coumadin), atrial flutter ablation, HTN presented for elective cath due to recurrent angina. He was recently seen in the office for symptoms of shortness of breath and chest pressure. Coumadin was held in prep for [facility] - see full report for details. He ultimately received BMS to SVG-ramus intermediate,otherwise patent LIMA-mLAD, patent RIMA-dRCA. The plan is for triple therapy with ASA/Plavix/Coumadin x 1 month then transition to dual therapy by dropping Plavix. Bleeding precautions reviewed with patient. The patient did well post-procedure. He ambulated with cardiac rehab without complication. Dr. Claiborne Billings has seen and examined the patient today and feels he is stable for discharge. He was instructed to avoid ibuprofen (on his med list previously) given triple therapy. INR was 3.2 one week prior thus will continue Coumadin at 7.5mg  daily (previously was taking 1.5 tablets one day per week). INR check scheduled for 6/5.  Statin started this  admission. If the patient is tolerating statin at time of follow-up appointment, would consider rechecking liver function/lipid panel in 6-8 weeks. _____________  Discharge Vitals Blood pressure 158/70, pulse 100, temperature 97.7 F (36.5 C), temperature source Oral, resp. rate 23, height 6' (1.829 m), weight 231 lb 7.7 oz (105 kg), SpO2 96 %.  Filed Weights   02/01/16 0946 02/02/16 0600  Weight: 232 lb (105.235 kg) 231 lb 7.7 oz (105 kg)    Labs & Radiologic Studies     CBC  Recent Labs  02/01/16 1019 02/02/16 0602  WBC  --  6.9  HGB  --  12.8*  HCT  --  39.4  MCV  --  97.8  PLT 176 [id number]   Basic Metabolic Panel  Recent Labs  02/02/16 0602  NA 139  K 4.8  CL 109  CO2 24  GLUCOSE 102*  BUN 11  CREATININE 0.86  CALCIUM 9.1  MG 2.0   Dg Chest 2 View  01/26/2016  CLINICAL DATA:  Chest pain. EXAM: CHEST  2 VIEW COMPARISON:  04/01/2014. FINDINGS: The heart is enlarged. Previous CABG has been performed. Calcified tortuous aorta. There is mild vascular congestion but no focal infiltrates or congestive failure. No acute osseous findings. IMPRESSION: No active cardiopulmonary disease. Electronically Signed   By: Staci Righter M.D.   On: 01/26/2016 15:11    Disposition   Pt is being discharged home today in good condition.  Follow-up Plans & Appointments    Follow-up Information    Follow up with [facility].   Specialty:  Cardiology   Why:  Coumadin Clinic visit 02/06/16 at 8am   Contact information:  [address] [place] [phone]      Follow up with [name], MD.   Specialty:  Cardiology   Why:  02/13/16 at 8am   Contact information:   [address] [place] [address] [phone]      Discharge Instructions    Amb Referral to Cardiac Rehabilitation    Complete by:  As directed   Diagnosis:  Coronary Stents     Diet - low sodium heart healthy    Complete by:  As directed        Increase activity slowly    Complete by:  As directed   No driving for 2 days. No lifting over 5 lbs for 1 week. No sexual activity for 1 week. Keep procedure site clean & dry. If you notice increased pain, swelling, bleeding or pus, call/return!  You may shower, but no soaking baths/hot tubs/pools for 1 week. If you notice any bleeding such as blood in stool, black tarry stools, blood in urine, nosebleeds or any other unusual bleeding, call your doctor immediately.  You will continue aspirin, Plavix, and Coumadin all together for 1 month. After 30 days, please stop Plavix. You will then continue aspirin and Coumadin together. Your doctor has started a new cholesterol medicine called atorvastatin. Patients taking blood thinners should generally stay away from medicines like ibuprofen, Advil, Motrin, naproxen, and Aleve due to risk of stomach bleeding. You may take Tylenol as directed or talk to your primary doctor about alternatives.           Discharge Medications   Current Discharge Medication List    START taking these medications   Details  atorvastatin (LIPITOR) 80 MG tablet Take 1 tablet (80 mg total) by mouth every evening. Qty: 30 tablet, Refills: 6    clopidogrel (PLAVIX) 75 MG tablet Take 1 tablet (75 mg total) by mouth daily. For 30 days then stop. Qty: 30 tablet, Refills: 0      CONTINUE these medications which have NOT CHANGED   Details  aspirin EC 81 MG tablet Take 1 tablet (81 mg total) by mouth daily.     diltiazem (DILACOR XR) 240 MG 24 hr capsule TAKE 1 CAPSULE BY MOUTH ONCE DAILY.     ezetimibe (ZETIA) 10 MG tablet Take 1 tablet (10 mg total) by mouth daily.     isosorbide mononitrate (IMDUR) 30 MG 24 hr tablet Take 1 tablet (30 mg total) by mouth daily.     metoprolol succinate (TOPROL-XL) 50 MG 24 hr tablet Take 1.5 tablets daily ( 75 mg).     nitroGLYCERIN (NITROSTAT) 0.4 MG SL tablet Place 1 tablet (0.4 mg total) under the tongue every 5 (five)  minutes as needed for chest pain.     ramipril (ALTACE) 2.5 MG capsule TAKE 1 CAPSULE BY MOUTH ONCE DAILY.     warfarin (COUMADIN) 7.5 MG tablet Take 1 tablet (7.5 mg total) by mouth daily.     oxyCODONE-acetaminophen (PERCOCET/ROXICET) 5-325 MG per tablet Take 1 tablet by mouth every 4 (four) hours as needed for moderate pain or severe pain.      STOP taking these medications     ibuprofen (ADVIL,MOTRIN) 200 MG tablet           Outstanding Labs/Studies   NA  Duration of Discharge Encounter   Greater than 30 minutes including physician time.  Signed, Nedra Hai Breyana Follansbee PA-C 02/02/2016, 10:21 AM

## 2016-02-06 ENCOUNTER — Ambulatory Visit (INDEPENDENT_AMBULATORY_CARE_PROVIDER_SITE_OTHER): Payer: Medicare Other | Admitting: Pharmacist

## 2016-02-06 DIAGNOSIS — I482 Chronic atrial fibrillation, unspecified: Secondary | ICD-10-CM

## 2016-02-06 DIAGNOSIS — Z7901 Long term (current) use of anticoagulants: Secondary | ICD-10-CM | POA: Diagnosis not present

## 2016-02-06 LAB — POCT INR: INR: 1.2

## 2016-02-13 ENCOUNTER — Encounter: Payer: Self-pay | Admitting: Cardiovascular Disease

## 2016-02-13 ENCOUNTER — Ambulatory Visit (INDEPENDENT_AMBULATORY_CARE_PROVIDER_SITE_OTHER): Payer: Medicare Other | Admitting: Cardiovascular Disease

## 2016-02-13 VITALS — BP 124/62 | HR 78 | Ht 72.0 in | Wt 230.0 lb

## 2016-02-13 DIAGNOSIS — I25700 Atherosclerosis of coronary artery bypass graft(s), unspecified, with unstable angina pectoris: Secondary | ICD-10-CM

## 2016-02-13 DIAGNOSIS — Z7901 Long term (current) use of anticoagulants: Secondary | ICD-10-CM | POA: Diagnosis not present

## 2016-02-13 DIAGNOSIS — E785 Hyperlipidemia, unspecified: Secondary | ICD-10-CM | POA: Diagnosis not present

## 2016-02-13 DIAGNOSIS — E669 Obesity, unspecified: Secondary | ICD-10-CM

## 2016-02-13 DIAGNOSIS — I25110 Atherosclerotic heart disease of native coronary artery with unstable angina pectoris: Secondary | ICD-10-CM

## 2016-02-13 DIAGNOSIS — I2510 Atherosclerotic heart disease of native coronary artery without angina pectoris: Secondary | ICD-10-CM

## 2016-02-13 DIAGNOSIS — I10 Essential (primary) hypertension: Secondary | ICD-10-CM | POA: Diagnosis not present

## 2016-02-13 NOTE — Patient Instructions (Signed)
Your physician has recommended you make the following change in your medication:   1.) stop CLOPIDOGREL AND ZETIA.  Your physician recommends that you return for lab work in:  2 months.  Your physician recommends that you schedule a follow-up appointment in:  3 months.

## 2016-02-13 NOTE — Progress Notes (Signed)
Patient ID: Leonard Cisneros, male   DOB: 10-Jun-1941, 75 y.o.   MRN: [id number]     Primary M.D.: Dr. [name]  HPI: Leonard Cisneros is a 75 y.o. male who presents to the office today in follow-up of his recent cardiac catheterization and percutaneous coronary intervention.  Leonard Cisneros has CAD and in 2003 underwent CABG surgery by Dr. Roxy Manns with LIMA to the LAD, RIMA to the distal RCA, and vein graft to his ramus intermediate vessel.  Initially, he had postoperative atrial fibrillation, which was controlled with Rythmol.  He is status post atrial flutter ablation by Dr. [name].  Over the last several years he has been in permanent atrial fibrillation and has been on warfarin anticoagulation.  His last nuclear perfusion study in March 2014 was essentially unchanged from February 2012 and did not reveal significant scar or ischemia.  He has a history of her left hip surgery with revision.  When I saw him last year he was exercising regularly, typically 6 days per week.   He denied any associated chest pressure.  On 12/07/2015  a nuclear stress test was done for a three-year follow-up evaluation.  At that time, he still remained asymptomatic.  On the stress test he developed 1-2 mm of asymptomatic horizontal ST segment depression in the inferolateral leads at peak stress, and had a hypertensive blood pressure response to exercise.  Scintigraphic images continues to show normal perfusion.  However, one month later in May.  He developed increasing episodes of exertionally precipitated chest pain.  I saw him on 01/26/2016.  I was concerned that he had developed significant anginal symptoms.  Recommended definitive cardiac catheterization.  This was done on 02/01/2016.  He was found to have low normal LV function with a subtle region of distal inferior hypocontractility.  There was significant 2 vessel, native CAD with total occlusion of the LAD and ramus intermediate, and its origin, normal left  circumflex coronary artery, and RCA with 70% proximal diffuse 50% mid stenosis with a "flush and fill "phenomena in the distal RCA due to competitive filling from the [place] graft.  He had a patent LIMA supplying the mid LAD and a patent RIMA supplying the distal RCA.  However, the vein graft which had supplied the ramus immediate vessel had a 99% near ostial subtotal stenosis with thrombus.  He underwent successful intervention with ultimate insertion of a 2.512 mm NirXLcell bare-metal stent postdilated to 2.64 mm with a 99% stenosis being reduced to 0%.  With his history of atrial fibrillation and chronic Coumadin therapy.  A bare metal stent rather than a DES stent was placed.  Subtotally, he has noticed complete resolution of prior symptomatology.  He has been active.  He will be participating in cardiac rehabilitation and [facility] hospital commencing in 2 weeks.  Past Medical History  Diagnosis Date  . Chronic atrial fibrillation (HCC)     a. initially post op CABG then chronic. On Coumadin.  [name] CAD (coronary artery disease)     a. s/p CABG 2003. b. 01/2016: unstable angina - s/p BMS to SVG-ramus intermediate, patent LIMA-mLAD, patent RIMA-dRCA.   [name] CHF (congestive heart failure) ([place])   . Essential hypertension   . Unspecified hereditary and idiopathic peripheral neuropathy 04/04/2014  . High cholesterol   . Chronic lower back pain   . Ischemic cardiomyopathy     a. Cath 01/2016 -  EF 50-55% with mild distal inferior hypocontractility.  . Atrial flutter ([place])  a. s/p remote ablation by Dr. Ladona Ridgel.    Past Surgical History  Procedure Laterality Date  . Colonoscopy  06/04/2002    Normal colon and rectum  . Joint replacement    . Lumbar disc surgery    . Shoulder open rotator cuff repair Right   . Coronary artery bypass graft  2003    LIMA to LAD,LIMA to distal RCA,SVG to ramus intermediate vessel.  . Total hip arthroplasty Right 1999  . Total hip arthroplasty Left 2008  . Colonoscopy   07/08/2012    Procedure: COLONOSCOPY;  Surgeon: Corbin Ade, MD;  Location: [facility] ENDO SUITE;  Service: Endoscopy;  Laterality: N/A;  11:30  . US echocardiography  11/13/2011    LV mildly dilated,mild concentric LVH,LA mod - severely dilated,RA mildly dilated,mild to mod. 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  . Foreign body removal Right 04/02/2014    Procedure: FOREIGN BODY REMOVAL RIGHT FOOT;  Surgeon: Dalia Heading, MD;  Location: [facility] ORS;  Service: General;  Laterality: Right;  . Revision total hip arthroplasty Bilateral 2005-2012    right-left  . Joint replacement    . Back surgery    . Cardiac catheterization  ~ 2000; 2003  . Coronary angioplasty with stent placement  02/01/2016  . Cardiac catheterization N/A 02/01/2016    Procedure: Left Heart Cath and Cors/Grafts Angiography;  Surgeon: Lennette Bihari, MD;  Location: [facility] INVASIVE CV LAB;  Service: Cardiovascular;  Laterality: N/A;  . Cardiac catheterization N/A 02/01/2016    Procedure: Coronary Stent Intervention;  Surgeon: Lennette Bihari, MD;  Location: [facility] INVASIVE CV LAB;  Service: Cardiovascular;  Laterality: N/A;    Allergies  Allergen Reactions  . Augmentin [Amoxicillin-Pot Clavulanate] Nausea And Vomiting    Current Outpatient Prescriptions  Medication Sig Dispense Refill  . aspirin EC 81 MG tablet Take 1 tablet (81 mg total) by mouth daily. 90 tablet 3  . atorvastatin (LIPITOR) 80 MG tablet Take 1 tablet (80 mg total) by mouth every evening. 30 tablet 6  . diltiazem (DILACOR XR) 240 MG 24 hr capsule TAKE 1 CAPSULE BY MOUTH ONCE DAILY. 30 capsule 2  . isosorbide mononitrate (IMDUR) 30 MG 24 hr tablet Take 1 tablet (30 mg total) by mouth daily. 30 tablet 3  . metoprolol succinate (TOPROL-XL) 50 MG 24 hr tablet Take 1.5 tablets daily ( 75 mg). 90 tablet 0  . nitroGLYCERIN (NITROSTAT) 0.4 MG SL tablet Place 1 tablet (0.4 mg total) under the tongue every 5 (five) minutes  as needed for chest pain. 25 tablet 3  . oxyCODONE-acetaminophen (PERCOCET/ROXICET) 5-325 MG per tablet Take 1 tablet by mouth every 4 (four) hours as needed for moderate pain or severe pain.    . ramipril (ALTACE) 2.5 MG capsule TAKE 1 CAPSULE BY MOUTH ONCE DAILY. 30 capsule 3  . warfarin (COUMADIN) 7.5 MG tablet Take 1 tablet (7.5 mg total) by mouth daily. 90 tablet 0   No current facility-administered medications for this visit.    Social History   Social History  . Marital Status: Married    Spouse Name: N/A  . Number of Children: 2  . Years of Education: N/A   Occupational History  . 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 Smoking status: Former Smoker -- 0.50 packs/day for 2 years    Types: Cigarettes    Quit date: 09/03/1966  . Smokeless tobacco: Never Used  . Alcohol  Use: 6.6 oz/week    0 Standard drinks or equivalent, 7 Glasses of wine, 4 Cans of beer per week     Comment: a glass of wine with dinner/ beer on the weekend  . Drug Use: No  . Sexual Activity: Yes   Other Topics Concern  . Not on file   Social History Narrative    Family History  Problem Relation Age of Onset  . Colon cancer Neg Hx   . Heart attack Brother     ROS General: Negative; No fevers, chills, or night sweats HEENT: Negative; No changes in vision or hearing, sinus congestion, difficulty swallowing Pulmonary: Negative; No cough, wheezing, shortness of breath, hemoptysis Cardiovascular:   See HPI;positive for CABG revascularization surgery, positive for atrial flutter ablation, positive for permanent atrial fibrillation;  GI: Negative; No nausea, vomiting, diarrhea, or abdominal pain GU: Negative; No dysuria, hematuria, or difficulty voiding Musculoskeletal: Positive for left hip surgery with revision; no myalgias, joint pain, or weakness Hematologic: Negative; no easy bruising, bleeding Endocrine: Negative; no heat/cold intolerance; no diabetes Neuro: Negative; no  changes in balance, headaches Skin: Negative; No rashes or skin lesions Psychiatric: Negative; No behavioral problems, depression Sleep: Negative; No snoring,  daytime sleepiness, hypersomnolence, bruxism, restless legs, hypnogognic hallucinations. Other comprehensive 14 point system review is negative   Physical Exam BP 124/62 mmHg  Pulse 78  Ht 6' (1.829 m)  Wt 230 lb (104.327 kg)  BMI 31.19 kg/m2   Wt Readings from Last 3 Encounters:  02/13/16 230 lb (104.327 kg)  02/02/16 231 lb 7.7 oz (105 kg)  01/26/16 235 lb 9.6 oz (106.867 kg)   General: Alert, oriented, no distress.  Skin: normal turgor, no rashes, warm and dry HEENT: Normocephalic, atraumatic. Pupils equal round and reactive to light; sclera anicteric; extraocular muscles intact, No lid lag; Nose without nasal septal hypertrophy; Mouth/Parynx benign; Mallinpatti scale 2 Neck: No JVD, no carotid bruits; normal carotid upstroke Lungs: clear to ausculatation and percussion bilaterally; no wheezing or rales, normal inspiratory and expiratory effort Chest wall: without tenderness to palpitation Heart: PMI not displaced, irregularly irregular rhythm with a controlled ventricular rate in the 60s, s1 s2 normal, 1/6systolic murmur, No diastolic murmur, no rubs, gallops, thrills, or heaves Abdomen: soft, nontender; no hepatosplenomehaly, BS+; abdominal aorta nontender and not dilated by palpation. Back: no CVA tenderness Pulses: 2+; right groin catheterization site well-healed.   Musculoskeletal: full range of motion, normal strength, no joint deformities Extremities: Pulses 2+, no clubbing cyanosis or edema, Homan's sign negative  Neurologic: grossly nonfocal; Cranial nerves grossly wnl Psychologic: Normal mood and affect  ECG (independently read by me): Atrial fibriillation of 78 bpm.  No ST segment abnormalities.  01/26/2016 ECG (independently read by me): Atrial fibrillation with a ventricular rate at 70 bpm.  One PVC.  June  2016 ECG (independently read by me): Atrial fibrillation at 67 bpm.  No significant ST-T changes.  May 2015 ECG (independently read by me): Atrial fibrillation at 67 beats per minute.  Rate control.  QTc interval 420 ms.  LABS:  BMP Latest Ref Rng 02/02/2016 01/26/2016 07/08/2015  Glucose 65 - 99 mg/dL 102(H) 100(H) 90  BUN 6 - 20 mg/dL '11 14 19  '$ Creatinine 0.61 - 1.24 mg/dL 0.86 0.80 0.84  BUN/Creat Ratio 10 - 24 - 18 23(H)  Sodium 135 - 145 mmol/L 139 144 139  Potassium 3.5 - 5.1 mmol/L 4.8 4.9 4.7  Chloride 101 - 111 mmol/L 109 106 102  CO2 22 - 32 mmol/L  $'24 21 21  'B$ Calcium 8.9 - 10.3 mg/dL 9.1 9.3 8.9   Hepatic Function Latest Ref Rng 01/26/2016 07/08/2015 04/20/2015  Total Protein 6.0 - 8.5 g/dL 6.6 6.1 6.7  Albumin 3.5 - 4.8 g/dL 4.3 4.1 4.5  AST 0 - 40 IU/L '23 24 22  '$ ALT 0 - 44 IU/L '22 23 22  '$ Alk Phosphatase 39 - 117 IU/L 74 69 95  Total Bilirubin 0.0 - 1.2 mg/dL 0.8 0.8 0.5   CBC Latest Ref Rng 02/02/2016 02/01/2016 01/26/2016  WBC 4.0 - 10.5 K/uL 6.9 - 7.3  Hemoglobin 13.0 - 17.0 g/dL 12.8(L) - -  Hematocrit 39.0 - 52.0 % 39.4 - 39.4  Platelets 150 - 400 K/uL 166 176 -   Lab Results  Component Value Date   MCV 97.8 02/02/2016   MCV 95 01/26/2016   MCV 94 04/20/2015   Lab Results  Component Value Date   TSH 2.220 04/20/2015   Lab Results  Component Value Date   HGBA1C 5.5 03/29/2014   Lipid Panel     Component Value Date/Time   CHOL 153 07/08/2015 1119   CHOL 163 01/13/2014 0737   TRIG 53 07/08/2015 1119   HDL 80 07/08/2015 1119   HDL 59 01/13/2014 0737   CHOLHDL 2.8 01/13/2014 0737   VLDL 17 01/13/2014 0737   LDLCALC 62 07/08/2015 1119   LDLCALC 87 01/13/2014 0737    RADIOLOGY: No results found.    ASSESSMENT AND PLAN: Leonard Cisneros is a 75 year old WM who is is 14 years status post CABG revascularization surgery by Dr. Roxy Manns and has bilateral arterial conduits to his LAD and RCA, as well as a vein graft to the intermediate vessel.  He has permanent  atrial fibrillation and is on chronic Coumadin therapy.  He has a history of hypertension and has been on long-acting diltiazem at 240 mg, Toprol-XL 50 mg, and ramipril 2.5 mg daily.  He had been on a statin but had been on Zetia 10 mg and his LDL in November 2016 was 62.  When I last saw him, he had developed symptoms suggestive of unstable angina.  Previously a nuclear stress test revealed ST segment changes without significant abnormality, but over the month prior to his catheterization.  His chest symptoms complex had significantly change.  I reviewed his catheterization findings in detail.  He was found to have subtotal stenosis of the vein graft supplying his ramus immediate vessel, which was successfully intervened upon and treated with a bare metal stent.  He is on triple therapy including aspirin, Plavix, and Coumadin for one month.  After one month Plavix will be discontinued.  He was started on high potency statin therapy following his intervention and I will discontinue his Zetia.  He used to be on warfarin for anticoagulation.  We discussed participation in the cardiac rehabilitation program.  His blood pressure today is stable on ramipril 2.5 mg, Toprol-XL 75 mg, diltiazem 240 mg, and his isosorbide.  He is in atrial fibrillation with a controlled ventricular rate with metoprolol and diltiazem for rate control.  He is on Coumadin for anticoagulation.  There is no bleeding.  He is asymptomatic with reference to recurrent chest pain.  In 3 months repeat blood work will be obtained and I will see him in the office for follow-up evaluation.  Time spent: 25 minutes  [name], MD, [facility]  02/13/2016 1:29 PM

## 2016-02-15 ENCOUNTER — Encounter: Payer: Self-pay | Admitting: *Deleted

## 2016-02-27 ENCOUNTER — Other Ambulatory Visit: Payer: Self-pay | Admitting: Cardiovascular Disease

## 2016-02-27 NOTE — Telephone Encounter (Signed)
Rx request sent to pharmacy.  

## 2016-03-01 ENCOUNTER — Encounter: Payer: Self-pay | Admitting: Orthopaedic Surgery

## 2016-03-01 ENCOUNTER — Encounter (HOSPITAL_COMMUNITY)
Admission: RE | Admit: 2016-03-01 | Discharge: 2016-03-01 | Disposition: A | Discharge status: Home / Self Care | Payer: Medicare Other | Source: Ambulatory Visit | Attending: Cardiovascular Disease | Admitting: Cardiovascular Disease

## 2016-03-01 ENCOUNTER — Ambulatory Visit (INDEPENDENT_AMBULATORY_CARE_PROVIDER_SITE_OTHER): Payer: Medicare Other | Admitting: Orthopaedic Surgery

## 2016-03-01 VITALS — BP 108/65 | HR 96 | Temp 97.7°F | Resp 16 | Ht 70.0 in | Wt 224.0 lb

## 2016-03-01 VITALS — BP 122/70 | HR 87 | Ht 72.0 in | Wt 227.6 lb

## 2016-03-01 DIAGNOSIS — Z955 Presence of coronary angioplasty implant and graft: Secondary | ICD-10-CM

## 2016-03-01 DIAGNOSIS — M25512 Pain in left shoulder: Secondary | ICD-10-CM

## 2016-03-01 DIAGNOSIS — E7800 Pure hypercholesterolemia, unspecified: Secondary | ICD-10-CM | POA: Diagnosis not present

## 2016-03-01 DIAGNOSIS — Z79899 Other long term (current) drug therapy: Secondary | ICD-10-CM | POA: Insufficient documentation

## 2016-03-01 DIAGNOSIS — I110 Hypertensive heart disease with heart failure: Secondary | ICD-10-CM | POA: Diagnosis not present

## 2016-03-01 DIAGNOSIS — I509 Heart failure, unspecified: Secondary | ICD-10-CM | POA: Insufficient documentation

## 2016-03-01 DIAGNOSIS — Z87891 Personal history of nicotine dependence: Secondary | ICD-10-CM | POA: Insufficient documentation

## 2016-03-01 DIAGNOSIS — I2510 Atherosclerotic heart disease of native coronary artery without angina pectoris: Secondary | ICD-10-CM | POA: Diagnosis not present

## 2016-03-01 DIAGNOSIS — I255 Ischemic cardiomyopathy: Secondary | ICD-10-CM | POA: Diagnosis not present

## 2016-03-01 DIAGNOSIS — Z7982 Long term (current) use of aspirin: Secondary | ICD-10-CM | POA: Insufficient documentation

## 2016-03-01 DIAGNOSIS — M7522 Bicipital tendinitis, left shoulder: Secondary | ICD-10-CM | POA: Diagnosis not present

## 2016-03-01 DIAGNOSIS — I482 Chronic atrial fibrillation: Secondary | ICD-10-CM | POA: Insufficient documentation

## 2016-03-01 DIAGNOSIS — I4892 Unspecified atrial flutter: Secondary | ICD-10-CM | POA: Diagnosis not present

## 2016-03-01 DIAGNOSIS — M67912 Unspecified disorder of synovium and tendon, left shoulder: Secondary | ICD-10-CM

## 2016-03-01 NOTE — Progress Notes (Signed)
Cardiac/Pulmonary Rehab Medication Review by a Pharmacist  Does the patient  feel that his/her medications are working for him/her?  yes  Has the patient been experiencing any side effects to the medications prescribed?  no  Does the patient measure his/her own blood pressure or blood glucose at home?  no   Does the patient have any problems obtaining medications due to transportation or finances?   no  Understanding of regimen: fair Understanding of indications: fair Potential of compliance: excellent  Questions asked to Determine Patient Understanding of Medication Regimen:  1. What is the name of the medication?  2. What is the medication used for?  3. When should it be taken?  4. How much should be taken?  5. How will you take it?  6. What side effects should you report?  Understanding Defined as: Excellent: All questions above are correct Good: Questions 1-4 are correct Fair: Questions 1-2 are correct  Poor: 1 or none of the above questions are correct   Pharmacist comments: Pt does not c/o any side effects.  Does not check BP at home.  Pt states INR is typically very stable.  No problems identified.  Med list updated.   Hart Robinsons A 03/01/2016 3:49 PM

## 2016-03-01 NOTE — Progress Notes (Signed)
CC:  My left shoulder is hurting again  He has chronic left shoulder rotator cuff symptoms and recurrent biceps tendinitis.  He has more pain recently.  He has no known trauma.  Examination of left Upper Extremity is done.  Inspection:   Overall:  Elbow non-tender without crepitus or defects, forearm non-tender without crepitus or defects, wrist non-tender without crepitus or defects, hand non-tender.    Shoulder: with glenohumeral joint tenderness, without effusion.   Upper arm: without swelling and tenderness   Range of motion:   Overall:  Full range of motion of the elbow, full range of motion of wrist and full range of motion in fingers.   Shoulder:  left  160 degrees forward flexion; 140 degrees abduction; 35 degrees internal rotation, 35 degrees external rotation, 20 degrees extension, 40 degrees adduction.   Stability:   Overall:  Shoulder, elbow and wrist stable   Strength and Tone:   Overall full shoulder muscles strength, full upper arm strength and normal upper arm bulk and tone.  PROCEDURE NOTE:  The patient request injection, verbal consent was obtained.  The left shoulder was prepped appropriately after time out was performed.   Sterile technique was observed and injection of 1 cc of Depo-Medrol 40 mg with several cc's of plain xylocaine. Anesthesia was provided by ethyl chloride and a 20-gauge needle was used to inject the shoulder area. A posterior approach was used.  The injection was tolerated well.  A band aid dressing was applied.  The patient was advised to apply ice later today and tomorrow to the injection sight as needed.  Encounter Diagnoses  Name Primary?  . Left shoulder pain Yes  . Biceps tendonitis on left   . Disorder of left rotator cuff    Return as needed  Continue exercises.  Call if any problem.  Electronically Signed Sanjuana Kava, MD 6/29/20179:09 AM

## 2016-03-01 NOTE — Progress Notes (Signed)
Cardiac Individual Treatment Plan  Patient Details  Name: Leonard Cisneros MRN: [id number] Date of Birth: [birth year] Referring Provider:        CARDIAC REHAB PHASE II EXERCISE from 03/01/2016 in [place]   Referring Provider  Dr. Claiborne Billings      Initial Encounter Date:       CARDIAC REHAB PHASE II EXERCISE from 03/01/2016 in [place]   Date  03/01/16   Referring Provider  Dr. Claiborne Billings      Visit Diagnosis: Stented coronary artery  [name] Home Medications on Admission:  Current outpatient prescriptions:  .  aspirin EC 81 MG tablet, Take 1 tablet (81 mg total) by mouth daily., Disp: 90 tablet, Rfl: 3 .  atorvastatin (LIPITOR) 80 MG tablet, Take 1 tablet (80 mg total) by mouth every evening., Disp: 30 tablet, Rfl: 6 .  diltiazem (DILACOR XR) 240 MG 24 hr capsule, TAKE 1 CAPSULE BY MOUTH ONCE DAILY., Disp: 30 capsule, Rfl: 2 .  isosorbide mononitrate (IMDUR) 30 MG 24 hr tablet, Take 1 tablet (30 mg total) by mouth daily., Disp: 30 tablet, Rfl: 3 .  metoprolol succinate (TOPROL-XL) 50 MG 24 hr tablet, TAKE 1 & 1/2 TABLET BY MOUTH ONCE DAILY., Disp: 90 tablet, Rfl: 1 .  nitroGLYCERIN (NITROSTAT) 0.4 MG SL tablet, Place 1 tablet (0.4 mg total) under the tongue every 5 (five) minutes as needed for chest pain., Disp: 25 tablet, Rfl: 3 .  oxyCODONE-acetaminophen (PERCOCET/ROXICET) 5-325 MG per tablet, Take 1 tablet by mouth every 4 (four) hours as needed for moderate pain or severe pain., Disp: , Rfl:  .  ramipril (ALTACE) 2.5 MG capsule, TAKE 1 CAPSULE BY MOUTH ONCE DAILY., Disp: 30 capsule, Rfl: 3 .  warfarin (COUMADIN) 7.5 MG tablet, Take 1 tablet (7.5 mg total) by mouth daily., Disp: 90 tablet, Rfl: 0  Past Medical History: Past Medical History  Diagnosis Date  . Chronic atrial fibrillation (HCC)     a. initially post op CABG then chronic. On Coumadin.  [name] CAD (coronary artery disease)     a. s/p CABG 2003. b. 01/2016: unstable angina - s/p BMS  to SVG-ramus intermediate, patent LIMA-mLAD, patent RIMA-dRCA.   [name] CHF (congestive heart failure) ([place])   . Essential hypertension   . Unspecified hereditary and idiopathic peripheral neuropathy 04/04/2014  . High cholesterol   . Chronic lower back pain   . Ischemic cardiomyopathy     a. Cath 01/2016 -  EF 50-55% with mild distal inferior hypocontractility.  . Atrial flutter ([place])     a. s/p remote ablation by Dr. [name].    Tobacco Use: History  Smoking status  . Former Smoker -- 0.50 packs/day for 2 years  . Types: Cigarettes  . Quit date: 09/03/1966  Smokeless tobacco  . Never Used    Labs:     Recent Review Flowsheet Data    Labs for ITP Cardiac and Pulmonary Rehab Latest Ref Rng 01/13/2014 03/29/2014 04/20/2015 07/08/2015   Cholestrol 100 - 199 mg/dL 163 - 208(H) 153   LDLCALC 0 - 99 mg/dL 87 - 109(H) 62   HDL >39 mg/dL 59 - 88 80   Trlycerides 0 - 149 mg/dL 84 - 54 53   Hemoglobin A1c <5.7 % - 5.5 - -      Capillary Blood Glucose: Lab Results  Component Value Date   GLUCAP 117* 04/02/2014   GLUCAP 98 04/01/2014     Exercise Target Goals: Date: 03/01/16  Exercise Program Goal:  Individual exercise prescription set with THRR, safety & activity barriers. Participant demonstrates ability to understand and report RPE using BORG scale, to self-measure pulse accurately, and to acknowledge the importance of the exercise prescription.  Exercise Prescription Goal: Starting with aerobic activity 30 plus minutes a day, 3 days per week for initial exercise prescription. Provide home exercise prescription and guidelines that participant acknowledges understanding prior to discharge.  Activity Barriers & Risk Stratification:     Activity Barriers & Cardiac Risk Stratification - 03/01/16 1639    Activity Barriers & Cardiac Risk Stratification   Activity Barriers Back Problems;Left Hip Replacement;Right Hip Replacement   Cardiac Risk Stratification High      6 Minute  Walk:     6 Minute Walk      03/01/16 1706       6 Minute Walk   Phase Initial     Distance 1100 feet     Walk Time 6 minutes     # of Rest Breaks 0     MPH 2.08     METS 2.59     RPE 13     Perceived Dyspnea  11     VO2 Peak 10.31     Symptoms Yes (comment)     Comments Back pain 7/10 during the walk. Pain stopped after 2 minute rest     Resting HR 87 bpm     Resting BP 122/78 mmHg     Max Ex. HR 134 bpm     Max Ex. BP 172/74 mmHg     2 Minute Post BP 138/74 mmHg        Initial Exercise Prescription:     Initial Exercise Prescription - 03/01/16 1700    Date of Initial Exercise RX and Referring Provider   Date 03/01/16   Referring Provider Dr. Claiborne Billings   Treadmill   MPH 1.3   Grade 0   Minutes 15   METs 1.9   NuStep   Level 2   Watts 21   Minutes 20   METs 1.9   Arm Ergometer   Level 2   Watts 29   Minutes 20   METs 3   Prescription Details   Frequency (times per week) 3   Duration Progress to 30 minutes of continuous aerobic without signs/symptoms of physical distress   Intensity   THRR REST +  30   THRR 40-80% of Max Heartrate 111-122-134   Ratings of Perceived Exertion 11-13   Perceived Dyspnea 0-4   Progression   Progression Continue to progress workloads to maintain intensity without signs/symptoms of physical distress.   Resistance Training   Training Prescription Yes   Weight 1   Reps 10-12      Perform Capillary Blood Glucose checks as needed.  Exercise Prescription Changes:   Exercise Comments:    Discharge Exercise Prescription (Final Exercise Prescription Changes):   Nutrition:  Target Goals: Understanding of nutrition guidelines, daily intake of sodium 1500mg , cholesterol 200mg , calories 30% from fat and 7% or less from saturated fats, daily to have 5 or more servings of fruits and vegetables.  Biometrics:     Pre Biometrics - 03/01/16 1711    Pre Biometrics   Height 6' (1.829 m)   Weight 227 lb 9.6 oz (103.239 kg)    Waist Circumference 43.5 inches   Hip Circumference 45 inches   Waist to Hip Ratio 0.97 %   BMI (Calculated) 30.9   Triceps Skinfold 12 mm   % Body Fat  29.2 %   Grip Strength 56 kg   Flexibility 0 in  Back problems and has had Total Hip Replacementx4   Single Leg Stand 2 seconds       Nutrition Therapy Plan and Nutrition Goals:   Nutrition Discharge: Rate Your Plate Scores:     Nutrition Assessments - 03/01/16 1714    MEDFICTS Scores   Pre Score 22      Nutrition Goals Re-Evaluation:   Psychosocial: Target Goals: Acknowledge presence or absence of depression, maximize coping skills, provide positive support system. Participant is able to verbalize types and ability to use techniques and skills needed for reducing stress and depression.  Initial Review & Psychosocial Screening:     Initial Psych Review & Screening - 03/01/16 1718    Initial Review   Current issues with --  None   Family Dynamics   Good Support System? Yes   Concerns --  None   Barriers   Psychosocial barriers to participate in program There are no identifiable barriers or psychosocial needs.   Screening Interventions   Interventions Encouraged to exercise      Quality of Life Scores:     Quality of Life - 03/01/16 1719    Quality of Life Scores   Health/Function Pre 24.8 %   Socioeconomic Pre 29.64 %   Psych/Spiritual Pre 27.21 %   Family Pre 27.6 %   GLOBAL Pre 26.71 %      PHQ-9:     Recent Review Flowsheet Data    Depression screen [facility] 2/9 03/01/2016 07/05/2015   Decreased Interest 0 0   Down, Depressed, Hopeless 0 0   PHQ - 2 Score 0 0   Altered sleeping 0 -   Tired, decreased energy 0 -   Change in appetite 0 -   Feeling bad or failure about yourself  0 -   Trouble concentrating 0 -   Moving slowly or fidgety/restless 0 -   Suicidal thoughts 0 -   PHQ-9 Score 0 -   Difficult doing work/chores Not difficult at all -      Psychosocial Evaluation and Intervention:      Psychosocial Evaluation - 03/01/16 1719    Psychosocial Evaluation & Interventions   Interventions Encouraged to exercise with the program and follow exercise prescription  No referral needed. Patient is not depressed.    Continued Psychosocial Services Needed No      Psychosocial Re-Evaluation:   Vocational Rehabilitation: Provide vocational rehab assistance to qualifying candidates.   Vocational Rehab Evaluation & Intervention:     Vocational Rehab - 03/01/16 1705    Initial Vocational Rehab Evaluation & Intervention   Assessment shows need for Vocational Rehabilitation No      Education: Education Goals: Education classes will be provided on a weekly basis, covering required topics. Participant will state understanding/return demonstration of topics presented.  Learning Barriers/Preferences:     Learning Barriers/Preferences - 03/01/16 1640    Learning Barriers/Preferences   Learning Barriers (p) None   Learning Preferences (p) Written Material  Reading      Education Topics: Hypertension, Hypertension Reduction -Define heart disease and high blood pressure. Discus how high blood pressure affects the body and ways to reduce high blood pressure.   Exercise and Your Heart -Discuss why it is important to exercise, the FITT principles of exercise, normal and abnormal responses to exercise, and how to exercise safely.   Angina -Discuss definition of angina, causes of angina, treatment of angina, and how to  decrease risk of having angina.   Cardiac Medications -Review what the following cardiac medications are used for, how they affect the body, and side effects that may occur when taking the medications.  Medications include Aspirin, Beta blockers, calcium channel blockers, ACE Inhibitors, angiotensin receptor blockers, diuretics, digoxin, and antihyperlipidemics.   Congestive Heart Failure -Discuss the definition of CHF, how to live with CHF, the signs and  symptoms of CHF, and how keep track of weight and sodium intake.   Heart Disease and Intimacy -Discus the effect sexual activity has on the heart, how changes occur during intimacy as we age, and safety during sexual activity.   Smoking Cessation / COPD -Discuss different methods to quit smoking, the health benefits of quitting smoking, and the definition of COPD.   Nutrition I: Fats -Discuss the types of cholesterol, what cholesterol does to the heart, and how cholesterol levels can be controlled.   Nutrition II: Labels -Discuss the different components of food labels and how to read food label   Heart Parts and Heart Disease -Discuss the anatomy of the heart, the pathway of blood circulation through the heart, and these are affected by heart disease.   Stress I: Signs and Symptoms -Discuss the causes of stress, how stress may lead to anxiety and depression, and ways to limit stress.   Stress II: Relaxation -Discuss different types of relaxation techniques to limit stress.   Warning Signs of Stroke / TIA -Discuss definition of a stroke, what the signs and symptoms are of a stroke, and how to identify when someone is having stroke.   Knowledge Questionnaire Score:     Knowledge Questionnaire Score - 03/01/16 1704    Knowledge Questionnaire Score   Pre Score 22/24      Core Components/Risk Factors/Patient Goals at Admission:     Personal Goals and Risk Factors at Admission - 03/01/16 1714    Core Components/Risk Factors/Patient Goals on Admission    Weight Management Weight Maintenance   Increase Strength and Stamina Yes   Intervention Provide advice, education, support and counseling about physical activity/exercise needs.;Develop an individualized exercise prescription for aerobic and resistive training based on initial evaluation findings, risk stratification, comorbidities and participant's personal goals.   Expected Outcomes Achievement of increased  cardiorespiratory fitness and enhanced flexibility, muscular endurance and strength shown through measurements of functional capacity and personal statement of participant.   Personal Goal Other Yes   Personal Goal Go back to walking 45 minutes per day, Get back to normal ADL's   Intervention Come to Cardiac Rehab 3 days a week and to supplement workout 2 days week at home or at local gym.    Expected Outcomes To regain strength and stamina      Core Components/Risk Factors/Patient Goals Review:    Core Components/Risk Factors/Patient Goals at Discharge (Final Review):    ITP Comments:     ITP Comments      03/01/16 1728           ITP Comments Mr. Conner is a 75 year old Caucasian male that has been through our Cardiac Rehab program before 14 years ago after having CABGx3 in 2004. He is eager to get started.           Comments: Patient arrived for 1st visit/orientation/education at 1430. Patient was referred to CR by Dr. [name] due to Stent Placement(Z95.5). During orientation advised patient on arrival and appointment times what to wear, what to do before, during and after exercise. Reviewed  attendance and class policy. Talked about inclement weather and class consultation policy. Pt is scheduled to return Cardiac Rehab on 03/05/16. Pt was advised to come to class 15 minutes before class starts. He was also given instructions on meeting with the dietician and attending the Family Structure classes. Pt is eager to get started. Patient tolerated the exercise walk test for assessment and functional fitness for 31 minutes. Patient did c/o back pain during test at 7/10. Pain subsided after 2 minute rest. Patient was measured for the equipment. Discussed equipment safety with patient. Took patient pre-anthropometric measurements. Patient finished visit at 1645.

## 2016-03-05 ENCOUNTER — Encounter (HOSPITAL_COMMUNITY)
Admission: RE | Admit: 2016-03-05 | Discharge: 2016-03-05 | Disposition: A | Discharge status: Home / Self Care | Payer: Medicare Other | Source: Ambulatory Visit | Attending: Cardiovascular Disease | Admitting: Cardiovascular Disease

## 2016-03-05 DIAGNOSIS — Z955 Presence of coronary angioplasty implant and graft: Secondary | ICD-10-CM | POA: Diagnosis not present

## 2016-03-05 DIAGNOSIS — I2510 Atherosclerotic heart disease of native coronary artery without angina pectoris: Secondary | ICD-10-CM | POA: Insufficient documentation

## 2016-03-05 DIAGNOSIS — I509 Heart failure, unspecified: Secondary | ICD-10-CM | POA: Diagnosis not present

## 2016-03-05 DIAGNOSIS — Z7982 Long term (current) use of aspirin: Secondary | ICD-10-CM | POA: Insufficient documentation

## 2016-03-05 DIAGNOSIS — I255 Ischemic cardiomyopathy: Secondary | ICD-10-CM | POA: Diagnosis not present

## 2016-03-05 DIAGNOSIS — Z87891 Personal history of nicotine dependence: Secondary | ICD-10-CM | POA: Diagnosis not present

## 2016-03-05 DIAGNOSIS — I4892 Unspecified atrial flutter: Secondary | ICD-10-CM | POA: Insufficient documentation

## 2016-03-05 DIAGNOSIS — I110 Hypertensive heart disease with heart failure: Secondary | ICD-10-CM | POA: Insufficient documentation

## 2016-03-05 DIAGNOSIS — E7800 Pure hypercholesterolemia, unspecified: Secondary | ICD-10-CM | POA: Diagnosis not present

## 2016-03-05 DIAGNOSIS — I482 Chronic atrial fibrillation: Secondary | ICD-10-CM | POA: Diagnosis not present

## 2016-03-05 DIAGNOSIS — Z79899 Other long term (current) drug therapy: Secondary | ICD-10-CM | POA: Diagnosis not present

## 2016-03-05 NOTE — Progress Notes (Signed)
Daily Session Note  Patient Details  Name: Leonard Cisneros MRN: [id number] Date of Birth: 11-Sep-1940 Referring Provider:        CARDIAC REHAB PHASE II EXERCISE from 03/01/2016 in [place]   Referring Provider  Dr. Claiborne Billings      Encounter Date: 03/05/2016  Check In:     Session Check In - 03/05/16 0815    Check-In   Location [facility]   Staff Present Diane Angelina Pih, MS, EP, [facility], Exercise Physiologist;Gregory Luther Parody, BS, EP, Exercise Physiologist;Win Guajardo Wynetta Emery, RN, BSN   Supervising physician immediately available to respond to emergencies See telemetry face sheet for immediately available MD   Medication changes reported     No   Fall or balance concerns reported    No   Warm-up and Cool-down Performed as group-led instruction   Resistance Training Performed Yes   VAD Patient? No   Pain Assessment   Currently in Pain? No/denies   Pain Score 0-No pain   Multiple Pain Sites No      Capillary Blood Glucose: No results found for this or any previous visit (from the past 24 hour(s)).   Goals Met:  Independence with exercise equipment Exercise tolerated well No report of cardiac concerns or symptoms Strength training completed today  Goals Unmet:  Not Applicable  Comments: Check out 9:15.   Dr. Kate Sable is Medical Director for [facility] Cardiac and Pulmonary Rehab.

## 2016-03-07 ENCOUNTER — Encounter (HOSPITAL_COMMUNITY)
Admission: RE | Admit: 2016-03-07 | Discharge: 2016-03-07 | Disposition: A | Discharge status: Home / Self Care | Payer: Medicare Other | Source: Ambulatory Visit | Attending: Cardiovascular Disease | Admitting: Cardiovascular Disease

## 2016-03-07 ENCOUNTER — Other Ambulatory Visit: Payer: Self-pay | Admitting: Cardiovascular Disease

## 2016-03-07 ENCOUNTER — Telehealth: Payer: Self-pay | Admitting: Pharmacist

## 2016-03-07 DIAGNOSIS — I509 Heart failure, unspecified: Secondary | ICD-10-CM | POA: Diagnosis not present

## 2016-03-07 DIAGNOSIS — Z7982 Long term (current) use of aspirin: Secondary | ICD-10-CM | POA: Diagnosis not present

## 2016-03-07 DIAGNOSIS — I255 Ischemic cardiomyopathy: Secondary | ICD-10-CM | POA: Diagnosis not present

## 2016-03-07 DIAGNOSIS — E7800 Pure hypercholesterolemia, unspecified: Secondary | ICD-10-CM | POA: Diagnosis not present

## 2016-03-07 DIAGNOSIS — I4892 Unspecified atrial flutter: Secondary | ICD-10-CM | POA: Diagnosis not present

## 2016-03-07 DIAGNOSIS — Z955 Presence of coronary angioplasty implant and graft: Secondary | ICD-10-CM | POA: Diagnosis not present

## 2016-03-07 DIAGNOSIS — I110 Hypertensive heart disease with heart failure: Secondary | ICD-10-CM | POA: Diagnosis not present

## 2016-03-07 DIAGNOSIS — I2510 Atherosclerotic heart disease of native coronary artery without angina pectoris: Secondary | ICD-10-CM | POA: Diagnosis not present

## 2016-03-07 DIAGNOSIS — Z7901 Long term (current) use of anticoagulants: Secondary | ICD-10-CM | POA: Diagnosis not present

## 2016-03-07 DIAGNOSIS — I482 Chronic atrial fibrillation: Secondary | ICD-10-CM | POA: Diagnosis not present

## 2016-03-07 DIAGNOSIS — Z79899 Other long term (current) drug therapy: Secondary | ICD-10-CM | POA: Diagnosis not present

## 2016-03-07 DIAGNOSIS — Z87891 Personal history of nicotine dependence: Secondary | ICD-10-CM | POA: Diagnosis not present

## 2016-03-07 LAB — COAGUCHEK XS/INR WAIVED
INR: 5.5 — ABNORMAL HIGH (ref 0.9–1.1)
Prothrombin Time: 65.4 s

## 2016-03-07 NOTE — Progress Notes (Signed)
Daily Session Note  Patient Details  Name: Leonard Cisneros MRN: [id number] Date of Birth: Sep 16, 1940 Referring Provider:        CARDIAC REHAB PHASE II EXERCISE from 03/01/2016 in [place]   Referring Provider  Dr. Claiborne Billings      Encounter Date: 03/07/2016  Check In:     Session Check In - 03/07/16 0815    Check-In   Location [facility]   Staff Present Diane Angelina Pih, MS, EP, [facility], Exercise Physiologist;Gregory Luther Parody, BS, EP, Exercise Physiologist;Trini Christiansen Wynetta Emery, RN, BSN   Supervising physician immediately available to respond to emergencies See telemetry face sheet for immediately available MD   Medication changes reported     No   Fall or balance concerns reported    No   Warm-up and Cool-down Performed as group-led instruction   Resistance Training Performed Yes   VAD Patient? No   Pain Assessment   Currently in Pain? No/denies   Pain Score 0-No pain   Multiple Pain Sites No      Capillary Blood Glucose: No results found for this or any previous visit (from the past 24 hour(s)).   Goals Met:  Independence with exercise equipment Exercise tolerated well No report of cardiac concerns or symptoms Strength training completed today  Goals Unmet:  Not Applicable  Comments: Check out 9:15.   Dr. Kate Sable is Medical Director for [facility] Cardiac and Pulmonary Rehab.

## 2016-03-07 NOTE — Telephone Encounter (Signed)
Spoke to patient about going to lab to have INR checked as he is about 2 week overdue. He states he was planning to go tomorrow.

## 2016-03-08 ENCOUNTER — Ambulatory Visit (INDEPENDENT_AMBULATORY_CARE_PROVIDER_SITE_OTHER): Payer: Medicare Other | Admitting: Pharmacist

## 2016-03-08 DIAGNOSIS — Z7901 Long term (current) use of anticoagulants: Secondary | ICD-10-CM

## 2016-03-08 DIAGNOSIS — I482 Chronic atrial fibrillation, unspecified: Secondary | ICD-10-CM

## 2016-03-09 ENCOUNTER — Encounter (HOSPITAL_COMMUNITY)
Admission: RE | Admit: 2016-03-09 | Discharge: 2016-03-09 | Disposition: A | Discharge status: Home / Self Care | Payer: Medicare Other | Source: Ambulatory Visit | Attending: Cardiovascular Disease | Admitting: Cardiovascular Disease

## 2016-03-09 DIAGNOSIS — I255 Ischemic cardiomyopathy: Secondary | ICD-10-CM | POA: Diagnosis not present

## 2016-03-09 DIAGNOSIS — Z79899 Other long term (current) drug therapy: Secondary | ICD-10-CM | POA: Diagnosis not present

## 2016-03-09 DIAGNOSIS — I482 Chronic atrial fibrillation: Secondary | ICD-10-CM | POA: Diagnosis not present

## 2016-03-09 DIAGNOSIS — Z955 Presence of coronary angioplasty implant and graft: Secondary | ICD-10-CM | POA: Diagnosis not present

## 2016-03-09 DIAGNOSIS — I110 Hypertensive heart disease with heart failure: Secondary | ICD-10-CM | POA: Diagnosis not present

## 2016-03-09 DIAGNOSIS — I4892 Unspecified atrial flutter: Secondary | ICD-10-CM | POA: Diagnosis not present

## 2016-03-09 DIAGNOSIS — Z7982 Long term (current) use of aspirin: Secondary | ICD-10-CM | POA: Diagnosis not present

## 2016-03-09 DIAGNOSIS — I2510 Atherosclerotic heart disease of native coronary artery without angina pectoris: Secondary | ICD-10-CM | POA: Diagnosis not present

## 2016-03-09 DIAGNOSIS — I509 Heart failure, unspecified: Secondary | ICD-10-CM | POA: Diagnosis not present

## 2016-03-09 DIAGNOSIS — Z87891 Personal history of nicotine dependence: Secondary | ICD-10-CM | POA: Diagnosis not present

## 2016-03-09 DIAGNOSIS — E7800 Pure hypercholesterolemia, unspecified: Secondary | ICD-10-CM | POA: Diagnosis not present

## 2016-03-09 NOTE — Progress Notes (Signed)
Daily Session Note  Patient Details  Name: CARSYN TAUBMAN MRN: [id number] Date of Birth: 19-Dec-1940 Referring Provider:        CARDIAC REHAB PHASE II EXERCISE from 03/01/2016 in [place]   Referring Provider  Dr. Claiborne Billings      Encounter Date: 03/09/2016  Check In:     Session Check In - 03/09/16 1545    Check-In   Location [facility]   Staff Present Diane Angelina Pih, MS, EP, [facility], Exercise Physiologist;Gregory Luther Parody, BS, EP, Exercise Physiologist;Zimir Kittleson Wynetta Emery, RN, BSN   Supervising physician immediately available to respond to emergencies See telemetry face sheet for immediately available MD   Medication changes reported     No   Fall or balance concerns reported    No   Warm-up and Cool-down Performed as group-led instruction   Resistance Training Performed Yes   VAD Patient? No   Pain Assessment   Currently in Pain? No/denies   Pain Score 0-No pain   Multiple Pain Sites No      Capillary Blood Glucose: No results found for this or any previous visit (from the past 24 hour(s)).   Goals Met:  Independence with exercise equipment Exercise tolerated well No report of cardiac concerns or symptoms Strength training completed today  Goals Unmet:  Not Applicable  Comments: Check out 445   Dr. Kate Sable is Medical Director for [place] and Pulmonary Rehab.

## 2016-03-12 ENCOUNTER — Encounter (HOSPITAL_COMMUNITY): Payer: Medicare Other

## 2016-03-14 ENCOUNTER — Encounter (HOSPITAL_COMMUNITY): Payer: Medicare Other

## 2016-03-16 ENCOUNTER — Encounter (HOSPITAL_COMMUNITY): Payer: Medicare Other

## 2016-03-19 ENCOUNTER — Encounter (HOSPITAL_COMMUNITY): Payer: Medicare Other

## 2016-03-21 ENCOUNTER — Encounter (HOSPITAL_COMMUNITY): Payer: Medicare Other

## 2016-03-22 NOTE — Progress Notes (Signed)
Discharge Summary  Patient Details  Name: Leonard Cisneros MRN: [id number] Date of Birth: 1941-03-17 Referring Provider:            [place] from 03/01/2016 in [place]   Referring Provider  Dr. Claiborne Billings       Number of Visits: 4  Reason for Discharge:  Early Exit:  Patient stopped coming after 4 sessions d/t he felt the program was not challenging enough b/c he was exercising on his own.   Smoking History:  History  Smoking status  . Former Smoker -- 0.50 packs/day for 2 years  . Types: Cigarettes  . Quit date: 09/03/1966  Smokeless tobacco  . Never Used    Diagnosis:  Stented coronary artery  ADL UCSD:   Initial Exercise Prescription:     Initial Exercise Prescription - 03/01/16 1700    Date of Initial Exercise RX and Referring Provider   Date 03/01/16   Referring Provider Dr. Claiborne Billings   Treadmill   MPH 1.3   Grade 0   Minutes 15   METs 1.9   NuStep   Level 2   Watts 21   Minutes 20   METs 1.9   Arm Ergometer   Level 2   Watts 29   Minutes 20   METs 3   Prescription Details   Frequency (times per week) 3   Duration Progress to 30 minutes of continuous aerobic without signs/symptoms of physical distress   Intensity   THRR REST +  30   THRR 40-80% of Max Heartrate 111-122-134   Ratings of Perceived Exertion 11-13   Perceived Dyspnea 0-4   Progression   Progression Continue to progress workloads to maintain intensity without signs/symptoms of physical distress.   Resistance Training   Training Prescription Yes   Weight 1   Reps 10-12      Discharge Exercise Prescription (Final Exercise Prescription Changes):     Exercise Prescription Changes - 03/20/16 1200    Exercise Review   Progression Yes   Response to Exercise   Blood Pressure (Admit) 110/70 mmHg   Blood Pressure (Exercise) 118/70 mmHg   Blood Pressure (Exit) 110/70 mmHg   Heart Rate (Admit) 47 bpm   Heart Rate (Exercise) 94 bpm   Heart  Rate (Exit) 78 bpm   Rating of Perceived Exertion (Exercise) 8   Symptoms None   Duration Progress to 30 minutes of continuous aerobic without signs/symptoms of physical distress   Intensity Rest + 30   Progression   Progression Continue to progress workloads to maintain intensity without signs/symptoms of physical distress.   Resistance Training   Training Prescription Yes   Weight 3   Reps 10-12   Treadmill   MPH 1.5   Grade 0   Minutes 15   METs 2.1   NuStep   Level 2   Watts 17   Minutes 20   METs 3.68   Arm Ergometer   Level 2   Watts 29   Minutes 20   METs 3   Home Exercise Plan   Plans to continue exercise at Home   Frequency Add 2 additional days to program exercise sessions.      Functional Capacity:     6 Minute Walk      03/01/16 1706       6 Minute Walk   Phase Initial     Distance 1100 feet     Walk Time 6 minutes     #  of Rest Breaks 0     MPH 2.08     METS 2.59     RPE 13     Perceived Dyspnea  11     VO2 Peak 10.31     Symptoms Yes (comment)     Comments Back pain 7/10 during the walk. Pain stopped after 2 minute rest     Resting HR 87 bpm     Resting BP 122/78 mmHg     Max Ex. HR 134 bpm     Max Ex. BP 172/74 mmHg     2 Minute Post BP 138/74 mmHg        Psychological, QOL, Others - Outcomes: PHQ 2/9: Depression screen [facility] 2/9 03/01/2016 07/05/2015  Decreased Interest 0 0  Down, Depressed, Hopeless 0 0  PHQ - 2 Score 0 0  Altered sleeping 0 -  Tired, decreased energy 0 -  Change in appetite 0 -  Feeling bad or failure about yourself  0 -  Trouble concentrating 0 -  Moving slowly or fidgety/restless 0 -  Suicidal thoughts 0 -  PHQ-9 Score 0 -  Difficult doing work/chores Not difficult at all -    Quality of Life:     Quality of Life - 03/01/16 1719    Quality of Life Scores   Health/Function Pre 24.8 %   Socioeconomic Pre 29.64 %   Psych/Spiritual Pre 27.21 %   Family Pre 27.6 %   GLOBAL Pre 26.71 %      Personal  Goals: Goals established at orientation with interventions provided to work toward goal.     Personal Goals and Risk Factors at Admission - 03/01/16 1714    Core Components/Risk Factors/Patient Goals on Admission    Weight Management Weight Maintenance   Increase Strength and Stamina Yes   Intervention Provide advice, education, support and counseling about physical activity/exercise needs.;Develop an individualized exercise prescription for aerobic and resistive training based on initial evaluation findings, risk stratification, comorbidities and participant's personal goals.   Expected Outcomes Achievement of increased cardiorespiratory fitness and enhanced flexibility, muscular endurance and strength shown through measurements of functional capacity and personal statement of participant.   Personal Goal Other Yes   Personal Goal Go back to walking 45 minutes per day, Get back to normal ADL's   Intervention Come to Cardiac Rehab 3 days a week and to supplement workout 2 days week at home or at local gym.    Expected Outcomes To regain strength and stamina       Personal Goals Discharge:   Nutrition & Weight - Outcomes:     Pre Biometrics - 03/01/16 1711    Pre Biometrics   Height 6' (1.829 m)   Weight 227 lb 9.6 oz (103.239 kg)   Waist Circumference 43.5 inches   Hip Circumference 45 inches   Waist to Hip Ratio 0.97 %   BMI (Calculated) 30.9   Triceps Skinfold 12 mm   % Body Fat 29.2 %   Grip Strength 56 kg   Flexibility 0 in  Back problems and has had Total Hip Replacementx4   Single Leg Stand 2 seconds       Nutrition:   Nutrition Discharge:     Nutrition Assessments - 03/01/16 1714    MEDFICTS Scores   Pre Score 22      Education Questionnaire Score:     Knowledge Questionnaire Score - 03/01/16 1704    Knowledge Questionnaire Score   Pre Score 22/24

## 2016-03-23 ENCOUNTER — Encounter (HOSPITAL_COMMUNITY): Payer: Medicare Other

## 2016-03-26 ENCOUNTER — Encounter (HOSPITAL_COMMUNITY): Payer: Medicare Other

## 2016-03-27 ENCOUNTER — Other Ambulatory Visit: Payer: Self-pay | Admitting: Cardiovascular Disease

## 2016-03-27 ENCOUNTER — Encounter: Payer: Self-pay | Admitting: Family Medicine

## 2016-03-27 ENCOUNTER — Ambulatory Visit (INDEPENDENT_AMBULATORY_CARE_PROVIDER_SITE_OTHER): Payer: Medicare Other | Admitting: Family Medicine

## 2016-03-27 VITALS — BP 118/72 | Ht 70.0 in | Wt 225.6 lb

## 2016-03-27 DIAGNOSIS — M792 Neuralgia and neuritis, unspecified: Secondary | ICD-10-CM | POA: Diagnosis not present

## 2016-03-27 DIAGNOSIS — Z7901 Long term (current) use of anticoagulants: Secondary | ICD-10-CM | POA: Diagnosis not present

## 2016-03-27 LAB — COAGUCHEK XS/INR WAIVED
INR: 4.7 — ABNORMAL HIGH (ref 0.9–1.1)
Prothrombin Time: 56.4 s

## 2016-03-27 MED ORDER — OXYCODONE-ACETAMINOPHEN 5-325 MG PO TABS: 1.0000 | ORAL_TABLET | Freq: Two times a day (BID) | ORAL | 0 refills | Status: DC | PRN

## 2016-03-27 NOTE — Progress Notes (Signed)
   Subjective:    Patient ID: Leonard Cisneros, male    DOB: [birth year], 75 y.o.   MRN: [id number]  HPI Patient arrives with c/o chronic hip and back pain-is going on a trip to [place] on Friday and would like rx of pain med for trip overseas.  Going to [place] on Friday  Needs some pain med on hand during a flare  Using tylenol as needed  huritn g both hip and low back, walked thirty five min notes imro  Has had two surg times two, no need for more per surg    Review of Systems No headache, no major weight loss or weight gain, no chest pain no back pain abdominal pain no change in bowel habits complete ROS otherwise negative     Objective:   Physical Exam  Alert vitals stable, NAD. Blood pressure good on repeat. HEENT normal. Lungs clear. Heart regular rate and rhythm. Low back tender to percussion left hip painful with rotation      Assessment & Plan:  Impression chronic back pain and left hip pain. Post surgery. Generally does okay with over-the-counter is. Has experienced recent flare and would like something stronger for his trip overseas plan oxycodone prescribed. Proper use side effects discussed WSL

## 2016-03-28 ENCOUNTER — Ambulatory Visit: Payer: Medicare Other | Admitting: Orthopedic Surgery

## 2016-03-28 ENCOUNTER — Encounter (HOSPITAL_COMMUNITY): Payer: Medicare Other

## 2016-03-29 ENCOUNTER — Telehealth: Payer: Self-pay | Admitting: Cardiovascular Disease

## 2016-03-29 ENCOUNTER — Ambulatory Visit (INDEPENDENT_AMBULATORY_CARE_PROVIDER_SITE_OTHER): Payer: Medicare Other | Admitting: Pharmacist Clinician (PhC)/ Clinical Pharmacy Specialist

## 2016-03-29 DIAGNOSIS — I482 Chronic atrial fibrillation, unspecified: Secondary | ICD-10-CM

## 2016-03-29 DIAGNOSIS — Z7901 Long term (current) use of anticoagulants: Secondary | ICD-10-CM

## 2016-03-29 NOTE — Telephone Encounter (Signed)
Received call from [place] at [employer] in [place].  Pt had coumadin check 7/25 and still didn't have an order placed, needed order faxed to them.  Order placed and faxed to [phone].  Results from 7/25 routed to [facility].

## 2016-03-30 ENCOUNTER — Encounter (HOSPITAL_COMMUNITY): Payer: Medicare Other

## 2016-04-02 ENCOUNTER — Encounter (HOSPITAL_COMMUNITY): Payer: Medicare Other

## 2016-04-04 ENCOUNTER — Encounter (HOSPITAL_COMMUNITY): Payer: Medicare Other

## 2016-04-06 ENCOUNTER — Encounter (HOSPITAL_COMMUNITY): Payer: Medicare Other

## 2016-04-09 ENCOUNTER — Encounter (HOSPITAL_COMMUNITY): Payer: Medicare Other

## 2016-04-09 ENCOUNTER — Other Ambulatory Visit: Payer: Self-pay | Admitting: Cardiovascular Disease

## 2016-04-11 ENCOUNTER — Encounter (HOSPITAL_COMMUNITY): Payer: Medicare Other

## 2016-04-13 ENCOUNTER — Other Ambulatory Visit: Payer: Self-pay | Admitting: Cardiovascular Disease

## 2016-04-13 ENCOUNTER — Encounter (HOSPITAL_COMMUNITY): Payer: Medicare Other

## 2016-04-13 NOTE — Telephone Encounter (Signed)
REFILL 

## 2016-04-16 ENCOUNTER — Encounter (HOSPITAL_COMMUNITY): Payer: Medicare Other

## 2016-04-16 ENCOUNTER — Other Ambulatory Visit: Payer: Self-pay | Admitting: Cardiovascular Disease

## 2016-04-16 DIAGNOSIS — Z7901 Long term (current) use of anticoagulants: Secondary | ICD-10-CM | POA: Diagnosis not present

## 2016-04-16 LAB — COAGUCHEK XS/INR WAIVED
INR: 3 — ABNORMAL HIGH (ref 0.9–1.1)
Prothrombin Time: 36.5 s

## 2016-04-18 ENCOUNTER — Encounter (HOSPITAL_COMMUNITY): Payer: Medicare Other

## 2016-04-20 ENCOUNTER — Encounter (HOSPITAL_COMMUNITY): Payer: Medicare Other

## 2016-04-23 ENCOUNTER — Encounter (HOSPITAL_COMMUNITY): Payer: Medicare Other

## 2016-04-23 ENCOUNTER — Ambulatory Visit (INDEPENDENT_AMBULATORY_CARE_PROVIDER_SITE_OTHER): Payer: Medicare Other | Admitting: Pharmacist Clinician (PhC)/ Clinical Pharmacy Specialist

## 2016-04-23 DIAGNOSIS — I482 Chronic atrial fibrillation, unspecified: Secondary | ICD-10-CM

## 2016-04-23 DIAGNOSIS — Z7901 Long term (current) use of anticoagulants: Secondary | ICD-10-CM

## 2016-04-25 ENCOUNTER — Encounter (HOSPITAL_COMMUNITY): Payer: Medicare Other

## 2016-04-27 ENCOUNTER — Encounter (HOSPITAL_COMMUNITY): Payer: Medicare Other

## 2016-04-30 ENCOUNTER — Encounter (HOSPITAL_COMMUNITY): Payer: Medicare Other

## 2016-05-02 ENCOUNTER — Encounter (HOSPITAL_COMMUNITY): Payer: Medicare Other

## 2016-05-04 ENCOUNTER — Encounter (HOSPITAL_COMMUNITY): Payer: Medicare Other

## 2016-05-07 ENCOUNTER — Other Ambulatory Visit: Payer: Self-pay | Admitting: Cardiovascular Disease

## 2016-05-07 ENCOUNTER — Encounter (HOSPITAL_COMMUNITY): Payer: Medicare Other

## 2016-05-09 ENCOUNTER — Encounter (HOSPITAL_COMMUNITY): Payer: Medicare Other

## 2016-05-11 ENCOUNTER — Encounter (HOSPITAL_COMMUNITY): Payer: Medicare Other

## 2016-05-14 ENCOUNTER — Encounter (HOSPITAL_COMMUNITY): Payer: Medicare Other

## 2016-05-16 ENCOUNTER — Encounter (HOSPITAL_COMMUNITY): Payer: Medicare Other

## 2016-05-18 ENCOUNTER — Ambulatory Visit (INDEPENDENT_AMBULATORY_CARE_PROVIDER_SITE_OTHER): Payer: Medicare Other | Admitting: Cardiovascular Disease

## 2016-05-18 ENCOUNTER — Encounter: Payer: Self-pay | Admitting: Cardiovascular Disease

## 2016-05-18 ENCOUNTER — Encounter (HOSPITAL_COMMUNITY): Payer: Medicare Other

## 2016-05-18 VITALS — BP 104/60 | HR 65 | Ht 71.75 in | Wt 220.4 lb

## 2016-05-18 DIAGNOSIS — I10 Essential (primary) hypertension: Secondary | ICD-10-CM

## 2016-05-18 DIAGNOSIS — I482 Chronic atrial fibrillation, unspecified: Secondary | ICD-10-CM

## 2016-05-18 DIAGNOSIS — I2510 Atherosclerotic heart disease of native coronary artery without angina pectoris: Secondary | ICD-10-CM | POA: Diagnosis not present

## 2016-05-18 DIAGNOSIS — Z7901 Long term (current) use of anticoagulants: Secondary | ICD-10-CM | POA: Diagnosis not present

## 2016-05-18 DIAGNOSIS — E785 Hyperlipidemia, unspecified: Secondary | ICD-10-CM

## 2016-05-18 NOTE — Patient Instructions (Signed)
Medication Instructions:  Your physician recommends that you continue on your current medications as directed. Please refer to the Current Medication list given to you today.   Labwork: None ordered  Testing/Procedures: None ordered  Follow-Up: Your physician wants you to follow-up in: 6 months with Dr.Kelly You will receive a reminder letter in the mail two months in advance. If you don't receive a letter, please call our office to schedule the follow-up appointment.   Any Other Special Instructions Will Be Listed Below (If Applicable).     If you need a refill on your cardiac medications before your next appointment, please call your pharmacy.

## 2016-05-19 NOTE — Progress Notes (Signed)
Patient ID: Leonard Cisneros, male   DOB: 1940/11/26, 75 y.o.   MRN: [id number]     Primary M.D.: Dr. [name]  HPI: Leonard Cisneros is a 75 y.o. male who presents to the office today for a 3 month follow-up of his recent cardiac catheterization and percutaneous coronary intervention.  Leonard Cisneros has CAD and in 2003 underwent CABG surgery by Dr. Roxy Cisneros with LIMA to the LAD, RIMA to the distal RCA, and vein graft to his ramus intermediate vessel.  Initially, he had postoperative atrial fibrillation, which was controlled with Rythmol.  He is status post atrial flutter ablation by Dr. Lovena Cisneros.  Over the last several years he has been in permanent atrial fibrillation and has been on warfarin anticoagulation.  His last nuclear perfusion study in March 2014 was essentially unchanged from February 2012 and did not reveal significant scar or ischemia.  He has a history of her left hip surgery with revision.  When I saw him last year he was exercising regularly, typically 6 days per week.   He denied any associated chest pressure.  On 12/07/2015  a nuclear stress test was done for a three-year follow-up evaluation.  At that time, he still remained asymptomatic.  On the stress test he developed 1-2 mm of asymptomatic horizontal ST segment depression in the inferolateral leads at peak stress, and had a hypertensive blood pressure response to exercise.  Scintigraphic images continues to show normal perfusion.  However, one month later in May.  He developed increasing episodes of exertionally precipitated chest pain.  I saw him on 01/26/2016.  I was concerned that he had developed significant anginal symptoms.  Recommended definitive cardiac catheterization.  This was done on 02/01/2016.  He was found to have low normal LV function with a subtle region of distal inferior hypocontractility.  There was significant 2 vessel, native CAD with total occlusion of the LAD and ramus intermediate, and its origin, normal  left circumflex coronary artery, and RCA with 70% proximal diffuse 50% mid stenosis with a "flush and fill "phenomena in the distal RCA due to competitive filling from the [place] graft.  He had a patent LIMA supplying the mid LAD and a patent RIMA supplying the distal RCA.  However, the vein graft which had supplied the ramus immediate vessel had a 99% near ostial subtotal stenosis with thrombus.  He underwent successful intervention with ultimate insertion of a 2.512 mm NirXLcell bare-metal stent postdilated to 2.64 mm with a 99% stenosis being reduced to 0%.  With his history of atrial fibrillation and chronic Coumadin therap a bare metal stent rather than a DES stent was placed.  He has noticed complete resolution of prior symptomatology.  He has been active.  He will be participating in cardiac rehabilitation and [facility] hospital commencing in 2 weeks.  Since I saw him 3 months ago, he feels great.  He is walking daily.  He is doing aggressive yardwork.  He denies palpitations.  He presents for follow-up evaluation  Past Medical History:  Diagnosis Date  . Atrial flutter (Leonard Cisneros)    a. s/p remote ablation by Dr. Lovena Cisneros.  Leonard Cisneros CAD (coronary artery disease)    a. s/p CABG 2003. b. 01/2016: unstable angina - s/p BMS to SVG-ramus intermediate, patent LIMA-mLAD, patent RIMA-dRCA.   Leonard Cisneros CHF (congestive heart failure) ([place])   . Chronic atrial fibrillation (HCC)    a. initially post op CABG then chronic. On Coumadin.  . Chronic lower back pain   .  Essential hypertension   . High cholesterol   . Ischemic cardiomyopathy    a. Cath 01/2016 -  EF 50-55% with mild distal inferior hypocontractility.  Leonard Cisneros Unspecified hereditary and idiopathic peripheral neuropathy 04/04/2014    Past Surgical History:  Procedure Laterality Date  . BACK SURGERY    . CARDIAC CATHETERIZATION  ~ 2000; 2003  . CARDIAC CATHETERIZATION N/A 02/01/2016   Procedure: Left Heart Cath and Cors/Grafts Angiography;  Surgeon: Leonard Sine, MD;   Location: [place] [facility];  Service: Cardiovascular;  Laterality: N/A;  . CARDIAC CATHETERIZATION N/A 02/01/2016   Procedure: Coronary Stent Intervention;  Surgeon: Leonard Sine, MD;  Location: [place] [facility];  Service: Cardiovascular;  Laterality: N/A;  . COLONOSCOPY  06/04/2002   Normal colon and rectum  . COLONOSCOPY  07/08/2012   Procedure: COLONOSCOPY;  Surgeon: [name], MD;  [facility];  Service: Endoscopy;  Laterality: N/A;  11:30  . CORONARY ANGIOPLASTY WITH STENT PLACEMENT  02/01/2016  . CORONARY ARTERY BYPASS GRAFT  2003   LIMA to LAD,LIMA to distal RCA,SVG to ramus intermediate vessel.  . FOREIGN BODY REMOVAL Right 04/02/2014   Procedure: FOREIGN BODY REMOVAL RIGHT FOOT;  Surgeon: Leonard So, MD;  Location: [facility];  Service: General;  Laterality: Right;  . JOINT REPLACEMENT    . JOINT REPLACEMENT    . LUMBAR DISC SURGERY    . REVISION TOTAL HIP ARTHROPLASTY Bilateral 2005-2012   right-left  . SHOULDER OPEN ROTATOR CUFF REPAIR Right   . TOTAL HIP ARTHROPLASTY Right 1999  . TOTAL HIP ARTHROPLASTY Left 2008  . US ECHOCARDIOGRAPHY  11/13/2011   LV mildly dilated,mild concentric LVH,LA mod - severely dilated,RA mildly dilated,mild to mod. 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    Allergies  Allergen Reactions  . Augmentin [Amoxicillin-Pot Clavulanate] Nausea And Vomiting    Current Outpatient Prescriptions  Medication Sig Dispense Refill  . aspirin EC 81 MG tablet Take 1 tablet (81 mg total) by mouth daily. 90 tablet 3  . atorvastatin (LIPITOR) 80 MG tablet Take 1 tablet (80 mg total) by mouth every evening. 30 tablet 6  . diltiazem (DILACOR XR) 240 MG 24 hr capsule Take 1 capsule (240 mg total) by mouth daily. KEEP OV. 30 capsule 0  . isosorbide mononitrate (IMDUR) 30 MG 24 hr tablet TAKE ONE TABLET BY MOUTH ONCE DAILY. 30 tablet 8  . metoprolol succinate (TOPROL-XL) 50 MG 24 hr tablet  TAKE 1 & 1/2 TABLET BY MOUTH ONCE DAILY. 90 tablet 1  . nitroGLYCERIN (NITROSTAT) 0.4 MG SL tablet Place 1 tablet (0.4 mg total) under the tongue every 5 (five) minutes as needed for chest pain. 25 tablet 3  . oxyCODONE-acetaminophen (PERCOCET/ROXICET) 5-325 MG tablet Take 1 tablet by mouth 2 (two) times daily as needed for moderate pain or severe pain. 36 tablet 0  . ramipril (ALTACE) 2.5 MG capsule TAKE 1 CAPSULE BY MOUTH ONCE DAILY. 30 capsule 8  . warfarin (COUMADIN) 7.5 MG tablet TAKE 1 TO 1 & 1/2 TABLET BY MOUTH ONCE DAILY AS DIRECTED. 40 tablet 1   No current facility-administered medications for this visit.     Social History   Social History  . Marital status: Married    Spouse name: N/A  . Number of children: 2  . Years of education: N/A   Occupational History  . 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  Smoking status: Former Smoker    Packs/day: 0.50    Years: 2.00    Types: Cigarettes    Quit date: 09/03/1966  . Smokeless tobacco: Never Used  . Alcohol use 6.6 oz/week    7 Glasses of wine, 4 Cans of beer per week     Comment: a glass of wine with dinner/ beer on the weekend  . Drug use: No  . Sexual activity: Yes   Other Topics Concern  . Not on file   Social History Narrative  . No narrative on file    Family History  Problem Relation Age of Onset  . Heart attack Brother   . Colon cancer Neg Hx     ROS General: Negative; No fevers, chills, or night sweats HEENT: Negative; No changes in vision or hearing, sinus congestion, difficulty swallowing Pulmonary: Negative; No cough, wheezing, shortness of breath, hemoptysis Cardiovascular:   See HPI;positive for CABG revascularization surgery, positive for atrial flutter ablation, positive for permanent atrial fibrillation;  GI: Negative; No nausea, vomiting, diarrhea, or abdominal pain GU: Negative; No dysuria, hematuria, or difficulty voiding Musculoskeletal: Positive for left hip  surgery with revision; no myalgias, joint pain, or weakness Hematologic: Negative; no easy bruising, bleeding Endocrine: Negative; no heat/cold intolerance; no diabetes Neuro: Negative; no changes in balance, headaches Skin: Negative; No rashes or skin lesions Psychiatric: Negative; No behavioral problems, depression Sleep: Negative; No snoring,  daytime sleepiness, hypersomnolence, bruxism, restless legs, hypnogognic hallucinations. Other comprehensive 14 point system review is negative   Physical Exam BP 104/60 (BP Location: Left Arm, Patient Position: Sitting, Cuff Size: Normal)   Pulse 65   Ht 5' 11.75" (1.822 m)   Wt 220 lb 6.4 oz (100 kg)   BMI 30.10 kg/m    Repeat BP 110/70  Wt Readings from Last 3 Encounters:  05/18/16 220 lb 6.4 oz (100 kg)  03/27/16 225 lb 9.6 oz (102.3 kg)  03/01/16 227 lb 9.6 oz (103.2 kg)   General: Alert, oriented, no distress.  Skin: normal turgor, no rashes, warm and dry HEENT: Normocephalic, atraumatic. Pupils equal round and reactive to light; sclera anicteric; extraocular muscles intact, No lid lag; Nose without nasal septal hypertrophy; Mouth/Parynx benign; Mallinpatti scale 2 Neck: No JVD, no carotid bruits; normal carotid upstroke Lungs: clear to ausculatation and percussion bilaterally; no wheezing or rales, normal inspiratory and expiratory effort Chest wall: without tenderness to palpitation Heart: PMI not displaced, irregularly irregular rhythm with a controlled ventricular rate in the 60s, s1 s2 normal, 1/6systolic murmur, No diastolic murmur, no rubs, gallops, thrills, or heaves Abdomen: soft, nontender; no hepatosplenomehaly, BS+; abdominal aorta nontender and not dilated by palpation. Back: no CVA tenderness Pulses: 2+; right groin catheterization site well-healed.   Musculoskeletal: full range of motion, normal strength, no joint deformities Extremities: Pulses 2+, no clubbing cyanosis or edema, Homan's sign negative  Neurologic:  grossly nonfocal; Cranial nerves grossly wnl Psychologic: Normal mood and affect  ECG (independently read by me): Atrial fibrillation with controlled ventricular response at 65 bpm. QTc interval 436 ms.  June 2017 ECG (independently read by me): Atrial fibriillation of 78 bpm.  No ST segment abnormalities.  01/26/2016 ECG (independently read by me): Atrial fibrillation with a ventricular rate at 70 bpm.  One PVC.  June 2016 ECG (independently read by me): Atrial fibrillation at 67 bpm.  No significant ST-T changes.  May 2015 ECG (independently read by me): Atrial fibrillation at 67 beats per minute.  Rate control.  QTc interval 420 ms.  LABS:  BMP Latest Ref Rng & Units 02/02/2016 01/26/2016 07/08/2015  Glucose 65 - 99 mg/dL 102(H) 100(H) 90  BUN 6 - 20 mg/dL '11 14 19  '$ Creatinine 0.61 - 1.24 mg/dL 0.86 0.80 0.84  BUN/Creat Ratio 10 - 24 - 18 23(H)  Sodium 135 - 145 mmol/L 139 144 139  Potassium 3.5 - 5.1 mmol/L 4.8 4.9 4.7  Chloride 101 - 111 mmol/L 109 106 102  CO2 22 - 32 mmol/L '24 21 21  '$ Calcium 8.9 - 10.3 mg/dL 9.1 9.3 8.9   Hepatic Function Latest Ref Rng & Units 01/26/2016 07/08/2015 04/20/2015  Total Protein 6.0 - 8.5 g/dL 6.6 6.1 6.7  Albumin 3.5 - 4.8 g/dL 4.3 4.1 4.5  AST 0 - 40 IU/L '23 24 22  '$ ALT 0 - 44 IU/L '22 23 22  '$ Alk Phosphatase 39 - 117 IU/L 74 69 95  Total Bilirubin 0.0 - 1.2 mg/dL 0.8 0.8 0.5   CBC Latest Ref Rng & Units 02/02/2016 02/01/2016 01/26/2016  WBC 4.0 - 10.5 K/uL 6.9 - 7.3  Hemoglobin 13.0 - 17.0 g/dL 12.8(L) - -  Hematocrit 39.0 - 52.0 % 39.4 - 39.4  Platelets 150 - 400 K/uL 166 176 -   Lab Results  Component Value Date   MCV 97.8 02/02/2016   MCV 95 01/26/2016   MCV 94 04/20/2015   Lab Results  Component Value Date   TSH 2.220 04/20/2015   Lab Results  Component Value Date   HGBA1C 5.5 03/29/2014   Lipid Panel     Component Value Date/Time   CHOL 153 07/08/2015 1119   TRIG 53 07/08/2015 1119   HDL 80 07/08/2015 1119   CHOLHDL 2.8  01/13/2014 0737   VLDL 17 01/13/2014 0737   LDLCALC 62 07/08/2015 1119    RADIOLOGY: No results found.    ASSESSMENT AND PLAN: Leonard. Kallon [name] is a 75 year old WM who is is 14 years status post CABG revascularization surgery by Dr. Roxy Cisneros in 2003 and has bilateral arterial conduits to his LAD and RCA, as well as a vein graft to the intermediate vessel.  He has permanent atrial fibrillation and is on chronic Coumadin therapy.  He has a history of hypertension and has been on long-acting diltiazem at 240 mg, Toprol-XL 50 mg, and ramipril 2.5 mg daily.  He had been on a statin but had been on Zetia 10 mg and his LDL in November 2016 was 62.  He had developed symptoms suggestive of unstable angina.which led to repeat cardiac catheterization on 02/01/2016.  He was found to have subtotal stenosis of the vein graft supplying his ramus immediate vessel, which was successfully intervened upon and treated with a bare metal stent.  He was on triple therapy including aspirin, Plavix, and Coumadin for one month and after one month Plavix was discontinued.  He was started on high potency statin therapy following his intervention and I discontinued his Zetia.  He used to be on warfarin for anticoagulation.  We discussed participation in the cardiac rehabilitation program.  His blood pressure today is excellent at 110/70 on ramipril 2.5 mg, Toprol-XL 75 mg, diltiazem 240 mg, and his isosorbide.  He is in atrial fibrillation with a controlled ventricular rate with metoprolol and diltiazem for rate control.  He is on Coumadin for anticoagulation.  There is no bleeding.  He feels great following his intervention.  He will continue current therapy.  I will see him in 6 months for cardiology reevaluation.  Time spent: 25 minutes  Leonard Sine, MD, [facility]  05/19/2016 6:24 PM

## 2016-05-21 ENCOUNTER — Encounter (HOSPITAL_COMMUNITY): Payer: Medicare Other

## 2016-05-23 ENCOUNTER — Encounter (HOSPITAL_COMMUNITY): Payer: Medicare Other

## 2016-05-25 ENCOUNTER — Encounter (HOSPITAL_COMMUNITY): Payer: Medicare Other

## 2016-05-31 ENCOUNTER — Other Ambulatory Visit: Payer: Self-pay | Admitting: Cardiovascular Disease

## 2016-05-31 DIAGNOSIS — Z7901 Long term (current) use of anticoagulants: Secondary | ICD-10-CM | POA: Diagnosis not present

## 2016-05-31 LAB — COAGUCHEK XS/INR WAIVED
INR: 2.9 — AB (ref 0.9–1.1)
PROTHROMBIN TIME: 35.3 s

## 2016-06-04 ENCOUNTER — Ambulatory Visit (INDEPENDENT_AMBULATORY_CARE_PROVIDER_SITE_OTHER): Payer: Medicare Other | Admitting: Pharmacist Clinician (PhC)/ Clinical Pharmacy Specialist

## 2016-06-04 DIAGNOSIS — I482 Chronic atrial fibrillation, unspecified: Secondary | ICD-10-CM

## 2016-06-14 ENCOUNTER — Other Ambulatory Visit: Payer: Self-pay

## 2016-06-14 ENCOUNTER — Other Ambulatory Visit: Payer: Self-pay | Admitting: Cardiovascular Disease

## 2016-06-14 MED ORDER — DILTIAZEM HCL ER 240 MG PO CP24: 240.0000 mg | ORAL_CAPSULE | Freq: Every day | ORAL | 3 refills | Status: DC

## 2016-06-25 DIAGNOSIS — L570 Actinic keratosis: Secondary | ICD-10-CM | POA: Diagnosis not present

## 2016-06-25 DIAGNOSIS — X32XXXD Exposure to sunlight, subsequent encounter: Secondary | ICD-10-CM | POA: Diagnosis not present

## 2016-06-25 DIAGNOSIS — D225 Melanocytic nevi of trunk: Secondary | ICD-10-CM | POA: Diagnosis not present

## 2016-06-25 DIAGNOSIS — B078 Other viral warts: Secondary | ICD-10-CM | POA: Diagnosis not present

## 2016-07-04 ENCOUNTER — Other Ambulatory Visit: Payer: Self-pay | Admitting: Cardiovascular Disease

## 2016-07-04 NOTE — Telephone Encounter (Signed)
Rx(s) sent to pharmacy electronically.  

## 2016-07-10 ENCOUNTER — Telehealth: Payer: Self-pay

## 2016-07-10 ENCOUNTER — Ambulatory Visit (INDEPENDENT_AMBULATORY_CARE_PROVIDER_SITE_OTHER): Payer: Medicare Other | Admitting: Pharmacist

## 2016-07-10 ENCOUNTER — Other Ambulatory Visit: Payer: Self-pay | Admitting: Cardiovascular Disease

## 2016-07-10 DIAGNOSIS — I482 Chronic atrial fibrillation, unspecified: Secondary | ICD-10-CM

## 2016-07-10 DIAGNOSIS — Z7901 Long term (current) use of anticoagulants: Secondary | ICD-10-CM | POA: Diagnosis not present

## 2016-07-10 LAB — COAGUCHEK XS/INR WAIVED
INR: 5.1 — ABNORMAL HIGH (ref 0.9–1.1)
Prothrombin Time: 61.1 s

## 2016-07-10 LAB — PROTIME-INR: INR: 5.1 — AB (ref <=1.1)

## 2016-07-10 NOTE — Telephone Encounter (Signed)
Received call from [place] with [employer] calling to report patient's INR 5.1.Message given to our pharmacist Claiborne Billings.

## 2016-08-01 ENCOUNTER — Ambulatory Visit (INDEPENDENT_AMBULATORY_CARE_PROVIDER_SITE_OTHER): Payer: Medicare Other | Admitting: Pharmacist

## 2016-08-01 ENCOUNTER — Other Ambulatory Visit: Payer: Self-pay | Admitting: Cardiovascular Disease

## 2016-08-01 DIAGNOSIS — I482 Chronic atrial fibrillation, unspecified: Secondary | ICD-10-CM

## 2016-08-01 DIAGNOSIS — Z7901 Long term (current) use of anticoagulants: Secondary | ICD-10-CM | POA: Diagnosis not present

## 2016-08-01 LAB — COAGUCHEK XS/INR WAIVED
INR: 3.7 — ABNORMAL HIGH (ref 0.9–1.1)
Prothrombin Time: 44.9 s

## 2016-08-01 LAB — POCT INR: INR: 3.7

## 2016-08-08 ENCOUNTER — Other Ambulatory Visit: Payer: Self-pay | Admitting: Cardiovascular Disease

## 2016-08-10 ENCOUNTER — Ambulatory Visit: Payer: Medicare Other | Admitting: Orthopedic Surgery

## 2016-08-14 ENCOUNTER — Ambulatory Visit (INDEPENDENT_AMBULATORY_CARE_PROVIDER_SITE_OTHER): Payer: Medicare Other | Admitting: Orthopedic Surgery

## 2016-08-14 ENCOUNTER — Encounter: Payer: Self-pay | Admitting: Orthopedic Surgery

## 2016-08-14 DIAGNOSIS — G8929 Other chronic pain: Secondary | ICD-10-CM

## 2016-08-14 DIAGNOSIS — M25512 Pain in left shoulder: Secondary | ICD-10-CM | POA: Diagnosis not present

## 2016-08-14 DIAGNOSIS — M75122 Complete rotator cuff tear or rupture of left shoulder, not specified as traumatic: Secondary | ICD-10-CM | POA: Diagnosis not present

## 2016-08-14 MED ORDER — HYDROCODONE-ACETAMINOPHEN 5-325 MG PO TABS: 1.0000 | ORAL_TABLET | Freq: Four times a day (QID) | ORAL | 0 refills | Status: DC | PRN

## 2016-08-14 NOTE — Progress Notes (Signed)
Patient ID: Leonard Cisneros, male   DOB: 1941-04-30, 75 y.o.   MRN: [id number]  Chief Complaint  Patient presents with  . Follow-up    left shoulder pain    HPI Leonard Cisneros is a 75 y.o. male.  75 year old male previously seen for biceps tendinitis and rotator cuff disease presents back after 2 injections in his left subacromial space 1 in his biceps tendon with increasing pain weakness loss of motion and difficulties with activities of daily living such as putting his shirt or coat on    Review of Systems Review of Systems  Musculoskeletal:       NO NECK PAIN   Neurological: Negative for numbness.    Past Medical History:  Diagnosis Date  . Atrial flutter ([place])    a. s/p remote ablation by Dr. [name].  [name] CAD (coronary artery disease)    a. s/p CABG 2003. b. 01/2016: unstable angina - s/p BMS to SVG-ramus intermediate, patent LIMA-mLAD, patent RIMA-dRCA.   [name] CHF (congestive heart failure) ([place])   . Chronic atrial fibrillation (HCC)    a. initially post op CABG then chronic. On Coumadin.  . Chronic lower back pain   . Essential hypertension   . High cholesterol   . Ischemic cardiomyopathy    a. Cath 01/2016 -  EF 50-55% with mild distal inferior hypocontractility.  [name] Unspecified hereditary and idiopathic peripheral neuropathy 04/04/2014    Past Surgical History:  Procedure Laterality Date  . BACK SURGERY    . CARDIAC CATHETERIZATION  ~ 2000; 2003  . CARDIAC CATHETERIZATION N/A 02/01/2016   Procedure: Left Heart Cath and Cors/Grafts Angiography;  Surgeon: [name], MD;  Location: [place] [facility];  Service: Cardiovascular;  Laterality: N/A;  . CARDIAC CATHETERIZATION N/A 02/01/2016   Procedure: Coronary Stent Intervention;  Surgeon: [name], MD;  Location: [place] [facility];  Service: Cardiovascular;  Laterality: N/A;  . COLONOSCOPY  06/04/2002   Normal colon and rectum  . COLONOSCOPY  07/08/2012   Procedure: COLONOSCOPY;  Surgeon: [name], MD;   [facility];  Service: Endoscopy;  Laterality: N/A;  11:30  . CORONARY ANGIOPLASTY WITH STENT PLACEMENT  02/01/2016  . CORONARY ARTERY BYPASS GRAFT  2003   LIMA to LAD,LIMA to distal RCA,SVG to ramus intermediate vessel.  . FOREIGN BODY REMOVAL Right 04/02/2014   Procedure: FOREIGN BODY REMOVAL RIGHT FOOT;  Surgeon: [name], MD;  Location: [facility];  Service: General;  Laterality: Right;  . JOINT REPLACEMENT    . JOINT REPLACEMENT    . LUMBAR DISC SURGERY    . REVISION TOTAL HIP ARTHROPLASTY Bilateral 2005-2012   right-left  . SHOULDER OPEN ROTATOR CUFF REPAIR Right   . TOTAL HIP ARTHROPLASTY Right 1999  . TOTAL HIP ARTHROPLASTY Left 2008  . 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    Family History  Problem Relation Age of Onset  . Heart attack Brother   . Colon cancer Neg Hx     Social History Social History  Substance Use Topics  . Smoking status: Former Smoker    Packs/day: 0.50    Years: 2.00    Types: Cigarettes    Quit date: 09/03/1966  . Smokeless tobacco: Never Used  . Alcohol use 6.6 oz/week    7 Glasses of wine, 4 Cans  of beer per week     Comment: a glass of wine with dinner/ beer on the weekend    Allergies  Allergen Reactions  . Augmentin [Amoxicillin-Pot Clavulanate] Nausea And Vomiting    Current Outpatient Prescriptions  Medication Sig Dispense Refill  . aspirin EC 81 MG tablet Take 1 tablet (81 mg total) by mouth daily. 90 tablet 3  . atorvastatin (LIPITOR) 80 MG tablet Take 1 tablet (80 mg total) by mouth every evening. 30 tablet 6  . diltiazem (DILACOR XR) 240 MG 24 hr capsule Take 1 capsule (240 mg total) by mouth daily. KEEP OV. 90 capsule 3  . isosorbide mononitrate (IMDUR) 30 MG 24 hr tablet TAKE ONE TABLET BY MOUTH ONCE DAILY. 30 tablet 8  .  metoprolol succinate (TOPROL-XL) 50 MG 24 hr tablet TAKE 1 & 1/2 TABLET BY MOUTH ONCE DAILY. 90 tablet 1  . nitroGLYCERIN (NITROSTAT) 0.4 MG SL tablet Place 1 tablet (0.4 mg total) under the tongue every 5 (five) minutes as needed for chest pain. 25 tablet 3  . ramipril (ALTACE) 2.5 MG capsule TAKE 1 CAPSULE BY MOUTH ONCE DAILY. 30 capsule 8  . warfarin (COUMADIN) 7.5 MG tablet Take 1/2 to 1 tablet by mouth daily as directed by coumadin clinic 90 tablet 0  . HYDROcodone-acetaminophen (NORCO/VICODIN) 5-325 MG tablet Take 1 tablet by mouth every 6 (six) hours as needed for moderate pain. 30 tablet 0   No current facility-administered medications for this visit.     Physical Exam There were no vitals taken for this visit. Physical Exam  Constitutional: He is oriented to person, place, and time. He appears well-developed and well-nourished. No distress.  Cardiovascular: Normal rate and intact distal pulses.   Neurological: He is alert and oriented to person, place, and time.  Skin: Skin is warm and dry. No rash noted. He is not diaphoretic. No erythema. No pallor.  Psychiatric: He has a normal mood and affect. His behavior is normal. Judgment and thought content normal.   Ambulatory status  no assistive devices Right Shoulder Exam   Tenderness  The patient is experiencing no tenderness.    Range of Motion  Active Abduction: normal  Forward Flexion: normal   Muscle Strength  The patient has normal right shoulder strength.  Other  Erythema: absent Scars: present Sensation: normal Pulse: present   Left Shoulder Exam   Tenderness  The patient is experiencing tenderness in the acromion.  Range of Motion  Active Abduction: abnormal  Passive Abduction: normal  Forward Flexion: abnormal  External Rotation: normal   Muscle Strength  Abduction: 3/5  Supraspinatus: 3/5   Tests  Apprehension: positive Drop Arm: negative Impingement: positive  Other  Erythema:  absent Sensation: normal Pulse: present        Data Reviewed Imaging of the LEFT shoulder are independently reviewed and I interpreted these as Abnormal with proximal migration of the humerus break in Shenton's line of the shoulder greater tuberosity sclerosis suggestive of chronic rotator cuff insufficiency  Assessment  Encounter Diagnoses  Name Primary?  . Pain in joint of left shoulder Yes  . Chronic left shoulder pain   . Complete tear of left rotator cuff       Plan  Injection left subacromial space  Procedure note the subacromial injection shoulder left   Verbal consent was obtained to inject the  Left   Shoulder  Timeout was completed to confirm the injection site is a subacromial space of the  left  shoulder  Medication used Depo-Medrol 40 mg and lidocaine 1% 3 cc  Anesthesia was provided by ethyl chloride  The injection was performed in the left  posterior subacromial space. After pinning the skin with alcohol and anesthetized the skin with ethyl chloride the subacromial space was injected using a 20-gauge needle. There were no complications  Sterile dressing was applied.   MRI left shoulder

## 2016-08-17 ENCOUNTER — Ambulatory Visit (HOSPITAL_COMMUNITY)
Admission: RE | Admit: 2016-08-17 | Discharge: 2016-08-17 | Disposition: A | Discharge status: Home / Self Care | Payer: Medicare Other | Source: Ambulatory Visit | Attending: Orthopedic Surgery | Admitting: Orthopedic Surgery

## 2016-08-17 DIAGNOSIS — M25512 Pain in left shoulder: Secondary | ICD-10-CM

## 2016-08-17 DIAGNOSIS — G8929 Other chronic pain: Secondary | ICD-10-CM | POA: Diagnosis not present

## 2016-08-17 DIAGNOSIS — M75102 Unspecified rotator cuff tear or rupture of left shoulder, not specified as traumatic: Secondary | ICD-10-CM | POA: Insufficient documentation

## 2016-08-17 DIAGNOSIS — S43402A Unspecified sprain of left shoulder joint, initial encounter: Secondary | ICD-10-CM | POA: Diagnosis not present

## 2016-08-21 ENCOUNTER — Ambulatory Visit (INDEPENDENT_AMBULATORY_CARE_PROVIDER_SITE_OTHER): Payer: Medicare Other | Admitting: Orthopedic Surgery

## 2016-08-21 ENCOUNTER — Encounter: Payer: Self-pay | Admitting: Orthopedic Surgery

## 2016-08-21 DIAGNOSIS — M25512 Pain in left shoulder: Secondary | ICD-10-CM

## 2016-08-21 DIAGNOSIS — M75122 Complete rotator cuff tear or rupture of left shoulder, not specified as traumatic: Secondary | ICD-10-CM

## 2016-08-21 NOTE — Progress Notes (Signed)
Patient ID: Leonard Cisneros, male   DOB: 02-28-1941, 75 y.o.   MRN: [id number]  Follow up   Chief Complaint  Patient presents with  . Follow-up    MRI review left shoudler   Past Medical History:  Diagnosis Date  . Atrial flutter ([place])    a. s/p remote ablation by Dr. [name].  [name] CAD (coronary artery disease)    a. s/p CABG 2003. b. 01/2016: unstable angina - s/p BMS to SVG-ramus intermediate, patent LIMA-mLAD, patent RIMA-dRCA.   [name] CHF (congestive heart failure) ([place])   . Chronic atrial fibrillation (HCC)    a. initially post op CABG then chronic. On Coumadin.  . Chronic lower back pain   . Essential hypertension   . High cholesterol   . Ischemic cardiomyopathy    a. Cath 01/2016 -  EF 50-55% with mild distal inferior hypocontractility.  [name] Unspecified hereditary and idiopathic peripheral neuropathy 04/04/2014   Review of Systems  Respiratory: Negative.   Cardiovascular: Negative.     MRI   IMPRESSION: 1. 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     Electronically Signed   By: Kathreen Devoid   On: 08/17/2016 15:10  Addendum to MRI. After careful review of the MRI that there is at the musculotendinous junction. Making this a somewhat difficult repair. Although not retracted very far or large anterior to posterior the tendon is attached to bone where the musculotendinous junction is a side of the tear   Note patient received injection got significantly better has full range of motion his shoulder minimal pain  He will call us if he gets worse over the next few months otherwise will see him in 6 months for follow-up visit  He is using Norco for back pain 1-2 tablets per day and he can call for refill when needed

## 2016-08-29 ENCOUNTER — Other Ambulatory Visit: Payer: Self-pay | Admitting: Physician Assistant

## 2016-08-31 ENCOUNTER — Other Ambulatory Visit: Payer: Self-pay | Admitting: Cardiovascular Disease

## 2016-08-31 ENCOUNTER — Ambulatory Visit (INDEPENDENT_AMBULATORY_CARE_PROVIDER_SITE_OTHER): Payer: Medicare Other | Admitting: Pharmacist

## 2016-08-31 DIAGNOSIS — I482 Chronic atrial fibrillation, unspecified: Secondary | ICD-10-CM

## 2016-08-31 DIAGNOSIS — Z7901 Long term (current) use of anticoagulants: Secondary | ICD-10-CM | POA: Diagnosis not present

## 2016-08-31 LAB — COAGUCHEK XS/INR WAIVED
INR: 1.6 — AB (ref 0.9–1.1)
Prothrombin Time: 19.8 s

## 2016-08-31 LAB — PROTIME-INR: INR: 1.6 — AB (ref <=1.1)

## 2016-09-12 ENCOUNTER — Other Ambulatory Visit: Payer: Self-pay | Admitting: Cardiovascular Disease

## 2016-09-12 ENCOUNTER — Other Ambulatory Visit: Payer: Self-pay | Admitting: Physician Assistant

## 2016-09-12 NOTE — Telephone Encounter (Signed)
Atorvastatin refilled 08/29/16 #90 with 2 refills

## 2016-10-01 ENCOUNTER — Other Ambulatory Visit: Payer: Self-pay | Admitting: Physician Assistant

## 2016-10-03 ENCOUNTER — Other Ambulatory Visit: Payer: Self-pay | Admitting: Cardiovascular Disease

## 2016-10-03 ENCOUNTER — Ambulatory Visit (INDEPENDENT_AMBULATORY_CARE_PROVIDER_SITE_OTHER): Payer: Medicare Other | Admitting: Pharmacist

## 2016-10-03 DIAGNOSIS — I482 Chronic atrial fibrillation, unspecified: Secondary | ICD-10-CM

## 2016-10-03 DIAGNOSIS — Z7901 Long term (current) use of anticoagulants: Secondary | ICD-10-CM | POA: Diagnosis not present

## 2016-10-03 LAB — COAGUCHEK XS/INR WAIVED
INR: 2.2 — ABNORMAL HIGH (ref 0.9–1.1)
Prothrombin Time: 26.3 s

## 2016-10-03 LAB — PROTIME-INR: INR: 2.2 — AB (ref <=1.1)

## 2016-10-11 ENCOUNTER — Other Ambulatory Visit: Payer: Self-pay | Admitting: Physician Assistant

## 2016-10-19 ENCOUNTER — Telehealth: Payer: Self-pay | Admitting: Orthopedic Surgery

## 2016-10-19 NOTE — Telephone Encounter (Signed)
Hydrocodone-Acetaminophen 5/325 mg  Qty 30 Tablets   ° ° °Take 1 tablet by mouth every 6 (six) hours as needed for moderate pain. °

## 2016-10-19 NOTE — Telephone Encounter (Signed)
ROUTING TO DR HARRISON FOR APPROVAL 

## 2016-10-22 ENCOUNTER — Encounter: Payer: Self-pay | Admitting: Orthopedic Surgery

## 2016-10-22 ENCOUNTER — Ambulatory Visit (HOSPITAL_COMMUNITY)
Admission: RE | Admit: 2016-10-22 | Discharge: 2016-10-22 | Disposition: A | Discharge status: Home / Self Care | Payer: Medicare HMO | Source: Ambulatory Visit | Attending: Orthopedic Surgery | Admitting: Orthopedic Surgery

## 2016-10-22 ENCOUNTER — Ambulatory Visit (INDEPENDENT_AMBULATORY_CARE_PROVIDER_SITE_OTHER): Payer: Medicare HMO | Admitting: Orthopedic Surgery

## 2016-10-22 VITALS — BP 154/78 | HR 78 | Wt 233.0 lb

## 2016-10-22 DIAGNOSIS — M1711 Unilateral primary osteoarthritis, right knee: Secondary | ICD-10-CM | POA: Diagnosis not present

## 2016-10-22 DIAGNOSIS — M25061 Hemarthrosis, right knee: Secondary | ICD-10-CM | POA: Diagnosis not present

## 2016-10-22 DIAGNOSIS — M25561 Pain in right knee: Secondary | ICD-10-CM | POA: Diagnosis present

## 2016-10-22 DIAGNOSIS — M25512 Pain in left shoulder: Secondary | ICD-10-CM | POA: Diagnosis not present

## 2016-10-22 MED ORDER — HYDROCODONE-ACETAMINOPHEN 5-325 MG PO TABS: 1.0000 | ORAL_TABLET | Freq: Four times a day (QID) | ORAL | 0 refills | Status: DC | PRN

## 2016-10-22 NOTE — Progress Notes (Signed)
Patient ID: Leonard Cisneros, male   DOB: 1941/07/14, 76 y.o.   MRN: [id number]  Chief Complaint  Patient presents with  . Knee Pain    right knee pain    76 years old no trauma to the knee other than out blowing some leaves acute pain swelling decreased range of motion right knee now 1 week    Review of Systems  Constitutional: Negative for malaise/fatigue and weight loss.  Neurological: Negative for tingling and weakness.    Past Medical History:  Diagnosis Date  . Atrial flutter ([place])    a. s/p remote ablation by Dr. [name].  [name] CAD (coronary artery disease)    a. s/p CABG 2003. b. 01/2016: unstable angina - s/p BMS to SVG-ramus intermediate, patent LIMA-mLAD, patent RIMA-dRCA.   [name] CHF (congestive heart failure) ([place])   . Chronic atrial fibrillation (HCC)    a. initially post op CABG then chronic. On Coumadin.  . Chronic lower back pain   . Essential hypertension   . High cholesterol   . Ischemic cardiomyopathy    a. Cath 01/2016 -  EF 50-55% with mild distal inferior hypocontractility.  [name] Unspecified hereditary and idiopathic peripheral neuropathy 04/04/2014    Past Surgical History:  Procedure Laterality Date  . BACK SURGERY    . CARDIAC CATHETERIZATION  ~ 2000; 2003  . CARDIAC CATHETERIZATION N/A 02/01/2016   Procedure: Left Heart Cath and Cors/Grafts Angiography;  Surgeon: [name], MD;  Location: [place] [facility];  Service: Cardiovascular;  Laterality: N/A;  . CARDIAC CATHETERIZATION N/A 02/01/2016   Procedure: Coronary Stent Intervention;  Surgeon: [name], MD;  Location: [place] [facility];  Service: Cardiovascular;  Laterality: N/A;  . COLONOSCOPY  06/04/2002   Normal colon and rectum  . COLONOSCOPY  07/08/2012   Procedure: COLONOSCOPY;  Surgeon: [name], MD;  [facility];  Service: Endoscopy;  Laterality: N/A;  11:30  . CORONARY ANGIOPLASTY WITH STENT PLACEMENT  02/01/2016  . CORONARY ARTERY BYPASS GRAFT  2003   LIMA to LAD,LIMA to  distal RCA,SVG to ramus intermediate vessel.  . FOREIGN BODY REMOVAL Right 04/02/2014   Procedure: FOREIGN BODY REMOVAL RIGHT FOOT;  Surgeon: [name], MD;  Location: [facility];  Service: General;  Laterality: Right;  . JOINT REPLACEMENT    . JOINT REPLACEMENT    . LUMBAR DISC SURGERY    . REVISION TOTAL HIP ARTHROPLASTY Bilateral 2005-2012   right-left  . SHOULDER OPEN ROTATOR CUFF REPAIR Right   . TOTAL HIP ARTHROPLASTY Right 1999  . TOTAL HIP ARTHROPLASTY Left 2008  . 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    PHYSICAL EXAM  There were no vitals taken for this visit. GENERAL appearance reveals no gross abnormalities, normal development grooming and hygiene   MENTAL STATUS we note that the patient is awake alert and oriented to person place and time MOOD/AFFECT ARE NORMAL   GAIT reveals mild limp in the effected limb  EXAM OF THE right  KNEE SKIN no erythema lacerations or ecchymosis  INSPECTION mild tenderness over the lateral  joint line and moderate  joint effusion  ROM is 5-120 STABILITY tests for the acl (anterior drawer) and pcl posterior drawer are normal. the collateral ligaments are stable with varus valgus stress testing  MOTOR GRADE 5/5 in the quad  muscles    examination of the  left knee  No effusion   VASC 2+ dorsalis pedis pulse normal capillary refill excellent warmth to the extremity  NEURO normal sensation and no pathologic reflexes  LYMPH deferred noncontributory   IMAGING STUDIES  I have independently reviewed the x-rays and I interpreted the x-rays as follows:  Arthritis with effusion right knee  Dx   primary osteoarthritis of the  Right  knee  PLAN  Aspiration injection   Meds ordered this encounter  Medications  . HYDROcodone-acetaminophen  (NORCO/VICODIN) 5-325 MG tablet    Sig: Take 1 tablet by mouth every 6 (six) hours as needed for moderate pain.    Dispense:  30 tablet    Refill:  0   Procedure note injection and aspiration right knee joint  Verbal consent was obtained to aspirate and inject the right knee joint   Timeout was completed to confirm the site of aspiration and injection  An 18-gauge needle was used to aspirate the knee joint from a suprapatellar lateral approach.  The medications used were 40 mg of Depo-Medrol and 1% lidocaine 3 cc  Anesthesia was provided by ethyl chloride and the skin was prepped with alcohol.  After cleaning the skin with alcohol an 18-gauge needle was used to aspirate the right knee joint.  We obtained 30  cc of old blood   We follow this by injection of 40 mg of Depo-Medrol and 3 cc 1% lidocaine.  There were no complications. A sterile bandage was applied.    3:06 PM Arther Abbott, MD 10/22/2016

## 2016-10-31 ENCOUNTER — Other Ambulatory Visit: Payer: Self-pay | Admitting: *Deleted

## 2016-10-31 MED ORDER — ATORVASTATIN CALCIUM 80 MG PO TABS: 80.0000 mg | ORAL_TABLET | Freq: Every evening | ORAL | 0 refills | Status: DC

## 2016-10-31 NOTE — Telephone Encounter (Signed)
E-SENT TO PHARMACY 90 DAY SUPPLY  NO REFILLS ,NEED APPOINTMENT FOR 2018

## 2016-11-07 ENCOUNTER — Other Ambulatory Visit: Payer: Self-pay | Admitting: Cardiovascular Disease

## 2016-11-07 NOTE — Telephone Encounter (Signed)
REFILL 

## 2016-11-08 ENCOUNTER — Other Ambulatory Visit: Payer: Self-pay | Admitting: Cardiovascular Disease

## 2016-11-08 DIAGNOSIS — Z7901 Long term (current) use of anticoagulants: Secondary | ICD-10-CM | POA: Diagnosis not present

## 2016-11-08 LAB — PROTIME-INR: INR: 2.1 — AB (ref <=1.1)

## 2016-11-09 ENCOUNTER — Ambulatory Visit (INDEPENDENT_AMBULATORY_CARE_PROVIDER_SITE_OTHER): Payer: Medicare HMO | Admitting: Pharmacist Clinician (PhC)/ Clinical Pharmacy Specialist

## 2016-11-09 DIAGNOSIS — I482 Chronic atrial fibrillation, unspecified: Secondary | ICD-10-CM

## 2016-11-09 LAB — COAGUCHEK XS/INR WAIVED
INR: 2.1 — ABNORMAL HIGH (ref 0.9–1.1)
PROTHROMBIN TIME: 25.5 s

## 2016-12-18 ENCOUNTER — Other Ambulatory Visit: Payer: Self-pay | Admitting: Cardiovascular Disease

## 2016-12-18 ENCOUNTER — Ambulatory Visit (INDEPENDENT_AMBULATORY_CARE_PROVIDER_SITE_OTHER): Payer: Medicare HMO | Admitting: Pharmacist Clinician (PhC)/ Clinical Pharmacy Specialist

## 2016-12-18 DIAGNOSIS — I482 Chronic atrial fibrillation, unspecified: Secondary | ICD-10-CM

## 2016-12-18 DIAGNOSIS — Z7901 Long term (current) use of anticoagulants: Secondary | ICD-10-CM | POA: Diagnosis not present

## 2016-12-18 LAB — COAGUCHEK XS/INR WAIVED
INR: 3.1 — AB (ref 0.9–1.1)
PROTHROMBIN TIME: 37.8 s

## 2016-12-21 ENCOUNTER — Other Ambulatory Visit: Payer: Self-pay

## 2016-12-21 MED ORDER — RAMIPRIL 2.5 MG PO CAPS: 2.5000 mg | ORAL_CAPSULE | Freq: Every day | ORAL | 1 refills | Status: DC

## 2016-12-21 NOTE — Telephone Encounter (Signed)
Rx(s) sent to pharmacy electronically.  

## 2016-12-24 ENCOUNTER — Telehealth: Payer: Self-pay | Admitting: Orthopedic Surgery

## 2016-12-24 NOTE — Telephone Encounter (Signed)
ROUTING TO DR HARRISON TO ADVISE 

## 2016-12-24 NOTE — Telephone Encounter (Signed)
SCHEDULE APPT FIRST AVAILABLE

## 2016-12-24 NOTE — Telephone Encounter (Signed)
Are you saying that the shoulder injection was 3 months ago ??

## 2016-12-24 NOTE — Telephone Encounter (Signed)
Patient came by office relaying that his left shoulder pain is recurring; he is asking if he may schedule appointment for another injection.  Chart notes indicate last injection done at office visit 10/22/16 - on right knee, and shoulder problem was also addressed..  Please advise.  [name] cell ph# is [phone]

## 2016-12-25 NOTE — Telephone Encounter (Signed)
Spoke with patient; appointment scheduled; aware. 

## 2016-12-28 ENCOUNTER — Encounter: Payer: Self-pay | Admitting: Orthopedic Surgery

## 2016-12-28 ENCOUNTER — Ambulatory Visit (INDEPENDENT_AMBULATORY_CARE_PROVIDER_SITE_OTHER): Payer: Medicare HMO | Admitting: Orthopedic Surgery

## 2016-12-28 DIAGNOSIS — M25512 Pain in left shoulder: Secondary | ICD-10-CM

## 2016-12-28 DIAGNOSIS — G8929 Other chronic pain: Secondary | ICD-10-CM

## 2016-12-28 MED ORDER — HYDROCODONE-ACETAMINOPHEN 5-325 MG PO TABS: 1.0000 | ORAL_TABLET | Freq: Four times a day (QID) | ORAL | 0 refills | Status: DC | PRN

## 2016-12-28 NOTE — Progress Notes (Signed)
Chief Complaint  Patient presents with  . Follow-up    Recheck left shoulder.    76 year old male with pain in his left shoulder thought to be to rotator cuff disease  Injection worked well last time he would like another one  Procedure note the subacromial injection shoulder left   Verbal consent was obtained to inject the  Left   Shoulder  Timeout was completed to confirm the injection site is a subacromial space of the  left  shoulder  Medication used Depo-Medrol 40 mg and lidocaine 1% 3 cc  Anesthesia was provided by ethyl chloride  The injection was performed in the left  posterior subacromial space. After pinning the skin with alcohol and anesthetized the skin with ethyl chloride the subacromial space was injected using a 20-gauge needle. There were no complications  Sterile dressing was applied.  Meds ordered this encounter  Medications  . HYDROcodone-acetaminophen (NORCO/VICODIN) 5-325 MG tablet    Sig: Take 1 tablet by mouth every 6 (six) hours as needed for moderate pain.    Dispense:  30 tablet    Refill:  0   Encounter Diagnoses  Name Primary?  . Pain in joint of left shoulder Yes  . Chronic left shoulder pain     Follow-up as needed

## 2016-12-28 NOTE — Patient Instructions (Signed)

## 2017-01-27 ENCOUNTER — Other Ambulatory Visit: Payer: Self-pay | Admitting: Cardiovascular Disease

## 2017-01-31 ENCOUNTER — Other Ambulatory Visit: Payer: Self-pay | Admitting: Cardiovascular Disease

## 2017-01-31 DIAGNOSIS — Z7901 Long term (current) use of anticoagulants: Secondary | ICD-10-CM | POA: Diagnosis not present

## 2017-01-31 LAB — COAGUCHEK XS/INR WAIVED
INR: 5.3 — AB (ref 0.9–1.1)
PROTHROMBIN TIME: 64.1 s

## 2017-02-01 ENCOUNTER — Ambulatory Visit (INDEPENDENT_AMBULATORY_CARE_PROVIDER_SITE_OTHER): Payer: Medicare HMO | Admitting: Pharmacist Clinician (PhC)/ Clinical Pharmacy Specialist

## 2017-02-01 DIAGNOSIS — I482 Chronic atrial fibrillation, unspecified: Secondary | ICD-10-CM

## 2017-02-01 DIAGNOSIS — Z7901 Long term (current) use of anticoagulants: Secondary | ICD-10-CM

## 2017-02-12 ENCOUNTER — Other Ambulatory Visit: Payer: Self-pay | Admitting: Cardiovascular Disease

## 2017-02-12 DIAGNOSIS — Z7901 Long term (current) use of anticoagulants: Secondary | ICD-10-CM | POA: Diagnosis not present

## 2017-02-12 LAB — PROTIME-INR: INR: 1.7 — AB (ref 0.9–1.1)

## 2017-02-12 LAB — COAGUCHEK XS/INR WAIVED
INR: 1.7 — ABNORMAL HIGH (ref 0.9–1.1)
PROTHROMBIN TIME: 20.2 s

## 2017-02-13 ENCOUNTER — Ambulatory Visit (INDEPENDENT_AMBULATORY_CARE_PROVIDER_SITE_OTHER): Payer: Medicare HMO | Admitting: Pharmacist Clinician (PhC)/ Clinical Pharmacy Specialist

## 2017-02-13 DIAGNOSIS — I482 Chronic atrial fibrillation, unspecified: Secondary | ICD-10-CM

## 2017-02-13 DIAGNOSIS — Z7901 Long term (current) use of anticoagulants: Secondary | ICD-10-CM

## 2017-02-22 ENCOUNTER — Other Ambulatory Visit: Payer: Self-pay | Admitting: Cardiovascular Disease

## 2017-03-04 ENCOUNTER — Other Ambulatory Visit: Payer: Self-pay | Admitting: Cardiovascular Disease

## 2017-03-07 ENCOUNTER — Other Ambulatory Visit: Payer: Self-pay | Admitting: Cardiovascular Disease

## 2017-03-07 DIAGNOSIS — Z7901 Long term (current) use of anticoagulants: Secondary | ICD-10-CM | POA: Diagnosis not present

## 2017-03-07 LAB — COAGUCHEK XS/INR WAIVED
INR: 3.3 — ABNORMAL HIGH (ref 0.9–1.1)
PROTHROMBIN TIME: 39.6 s

## 2017-03-11 ENCOUNTER — Ambulatory Visit (INDEPENDENT_AMBULATORY_CARE_PROVIDER_SITE_OTHER): Payer: Medicare HMO | Admitting: Pharmacist

## 2017-03-11 DIAGNOSIS — I482 Chronic atrial fibrillation, unspecified: Secondary | ICD-10-CM

## 2017-03-11 DIAGNOSIS — Z7901 Long term (current) use of anticoagulants: Secondary | ICD-10-CM

## 2017-04-15 ENCOUNTER — Other Ambulatory Visit: Payer: Self-pay | Admitting: Cardiovascular Disease

## 2017-04-22 ENCOUNTER — Ambulatory Visit (INDEPENDENT_AMBULATORY_CARE_PROVIDER_SITE_OTHER): Payer: Medicare HMO | Admitting: Pharmacist Clinician (PhC)/ Clinical Pharmacy Specialist

## 2017-04-22 ENCOUNTER — Other Ambulatory Visit: Payer: Self-pay | Admitting: Cardiovascular Disease

## 2017-04-22 DIAGNOSIS — Z7901 Long term (current) use of anticoagulants: Secondary | ICD-10-CM | POA: Diagnosis not present

## 2017-04-22 DIAGNOSIS — I482 Chronic atrial fibrillation, unspecified: Secondary | ICD-10-CM

## 2017-04-22 LAB — COAGUCHEK XS/INR WAIVED
INR: 2.2 — ABNORMAL HIGH (ref 0.9–1.1)
Prothrombin Time: 26.7 s

## 2017-05-02 ENCOUNTER — Telehealth: Payer: Self-pay | Admitting: Pharmacist Clinician (PhC)/ Clinical Pharmacy Specialist

## 2017-05-02 NOTE — Telephone Encounter (Signed)
Coumadin letter 

## 2017-05-21 ENCOUNTER — Ambulatory Visit (INDEPENDENT_AMBULATORY_CARE_PROVIDER_SITE_OTHER): Payer: Medicare HMO | Admitting: Cardiovascular Disease

## 2017-05-21 ENCOUNTER — Encounter: Payer: Self-pay | Admitting: Cardiovascular Disease

## 2017-05-21 VITALS — BP 128/80 | HR 79 | Ht 71.0 in | Wt 226.0 lb

## 2017-05-21 DIAGNOSIS — E785 Hyperlipidemia, unspecified: Secondary | ICD-10-CM

## 2017-05-21 DIAGNOSIS — M25472 Effusion, left ankle: Secondary | ICD-10-CM

## 2017-05-21 DIAGNOSIS — I482 Chronic atrial fibrillation, unspecified: Secondary | ICD-10-CM

## 2017-05-21 DIAGNOSIS — Z79899 Other long term (current) drug therapy: Secondary | ICD-10-CM

## 2017-05-21 DIAGNOSIS — M25471 Effusion, right ankle: Secondary | ICD-10-CM | POA: Diagnosis not present

## 2017-05-21 DIAGNOSIS — Z7901 Long term (current) use of anticoagulants: Secondary | ICD-10-CM

## 2017-05-21 MED ORDER — HYDROCHLOROTHIAZIDE 12.5 MG PO CAPS: 12.5000 mg | ORAL_CAPSULE | Freq: Every day | ORAL | 3 refills | Status: DC

## 2017-05-21 NOTE — Progress Notes (Signed)
Patient ID: Leonard Cisneros, male   DOB: 02-25-1941, 76 y.o.   MRN: [id number]     Primary M.D.: Dr. [name]  HPI: Leonard Cisneros is a 76 y.o. male who presents to the office today for a 12 month follow-up cardiology evaluation.  Leonard Cisneros has CAD and in 2003 underwent CABG surgery by Dr. Roxy Manns with LIMA to the LAD, RIMA to the distal RCA, and vein graft to his ramus intermediate vessel.  Initially, he had postoperative atrial fibrillation, which was controlled with Rythmol.  He is status post atrial flutter ablation by Dr. [name].  Over the last several years he has been in permanent atrial fibrillation and has been on warfarin anticoagulation.  His last nuclear perfusion study in March 2014 was essentially unchanged from February 2012 and did not reveal significant scar or ischemia.  He has a history of her left hip surgery with revision.  When I saw him last in 2016 he was exercising regularly, typically 6 days per week.   He denied any associated chest pressure.  On 12/07/2015 a nuclear stress test was done for a three-year follow-up evaluation.  At that time, he still remained asymptomatic.  On the stress test he developed 1-2 mm of asymptomatic horizontal ST segment depression in the inferolateral leads at peak stress, and had a hypertensive blood pressure response to exercise.  Scintigraphic images continued to show normal perfusion.  However, one month later in May 2017 he developed increasing episodes of exertionally precipitated chest pain.  I saw him on 01/26/2016.  I was concerned that he had developed significant anginal symptoms and recommended definitive cardiac catheterization.  This was done on 02/01/2016.  He was found to have low normal LV function with a subtle region of distal inferior hypocontractility.  There was significant 2 vessel, native CAD with total occlusion of the LAD and ramus intermediate, and its origin, normal left circumflex coronary artery, and RCA with 70%  proximal diffuse 50% mid stenosis with a "flush and fill "phenomena in the distal RCA due to competitive filling from the [place] graft.  He had a patent LIMA supplying the mid LAD and a patent RIMA supplying the distal RCA.  However, the vein graft which had supplied the ramus immediate vessel had a 99% near ostial subtotal stenosis with thrombus.  He underwent successful intervention with ultimate insertion of a 2.512 mm NirXLcell bare-metal stent postdilated to 2.64 mm with a 99% stenosis being reduced to 0%.  With his history of atrial fibrillation and chronic Coumadin therap a bare metal stent rather than a DES stent was placed.  He has noticed complete resolution of prior symptomatology.  He has been active.  He will be participating in cardiac rehabilitation and [facility] commencing in 2 weeks.  When I saw him in September 2017.  He was continuing to do well asked year, he has remained asymptomatic without chest pain, or change in exercise symptomatology.  He denies palpitations. He has noticed some mild swelling of his ankles and feet.  He has chronic atrial fibrillation with a controlled ventricular response and denies any episodes of presyncope or syncope.  He denies bleeding on Coumadin.He presents for evaluation.  Past Medical History:  Diagnosis Date  . Atrial flutter ([place])    a. s/p remote ablation by Dr. [name].  [name] CAD (coronary artery disease)    a. s/p CABG 2003. b. 01/2016: unstable angina - s/p BMS to SVG-ramus intermediate, patent LIMA-mLAD, patent RIMA-dRCA.   [name]  CHF (congestive heart failure) ([place])   . Chronic atrial fibrillation (HCC)    a. initially post op CABG then chronic. On Coumadin.  . Chronic lower back pain   . Essential hypertension   . High cholesterol   . Ischemic cardiomyopathy    a. Cath 01/2016 -  EF 50-55% with mild distal inferior hypocontractility.  [name] Unspecified hereditary and idiopathic peripheral neuropathy 04/04/2014    Past Surgical History:    Procedure Laterality Date  . BACK SURGERY    . CARDIAC CATHETERIZATION  ~ 2000; 2003  . CARDIAC CATHETERIZATION N/A 02/01/2016   Procedure: Left Heart Cath and Cors/Grafts Angiography;  Surgeon: [name], MD;  Location: [place] [facility];  Service: Cardiovascular;  Laterality: N/A;  . CARDIAC CATHETERIZATION N/A 02/01/2016   Procedure: Coronary Stent Intervention;  Surgeon: [name], MD;  Location: [facility];  Service: Cardiovascular;  Laterality: N/A;  . COLONOSCOPY  06/04/2002   Normal colon and rectum  . COLONOSCOPY  07/08/2012   Procedure: COLONOSCOPY;  Surgeon: [name], MD;  [facility];  Service: Endoscopy;  Laterality: N/A;  11:30  . CORONARY ANGIOPLASTY WITH STENT PLACEMENT  02/01/2016  . CORONARY ARTERY BYPASS GRAFT  2003   LIMA to LAD,LIMA to distal RCA,SVG to ramus intermediate vessel.  . FOREIGN BODY REMOVAL Right 04/02/2014   Procedure: FOREIGN BODY REMOVAL RIGHT FOOT;  Surgeon: [name], MD;  Location: [facility];  Service: General;  Laterality: Right;  . JOINT REPLACEMENT    . JOINT REPLACEMENT    . LUMBAR DISC SURGERY    . REVISION TOTAL HIP ARTHROPLASTY Bilateral 2005-2012   right-left  . SHOULDER OPEN ROTATOR CUFF REPAIR Right   . TOTAL HIP ARTHROPLASTY Right 1999  . TOTAL HIP ARTHROPLASTY Left 2008  . US ECHOCARDIOGRAPHY  11/13/2011   LV mildly dilated,mild concentric LVH,LA mod - severely dilated,RA mildly dilated,mild to mod. 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    Allergies  Allergen Reactions  . Augmentin [Amoxicillin-Pot Clavulanate] Nausea And Vomiting    Current Outpatient Prescriptions  Medication Sig Dispense Refill  . aspirin EC 81 MG tablet Take 1 tablet (81 mg total) by mouth daily. 90 tablet 3  . atorvastatin (LIPITOR) 80 MG tablet TAKE 1 TABLET (80 MG TOTAL) BY MOUTH EVERY EVENING. 30 tablet 0  . diltiazem (DILACOR XR) 240 MG 24 hr capsule Take 1  capsule (240 mg total) by mouth daily. KEEP OV. 90 capsule 3  . diltiazem (TIAZAC) 240 MG 24 hr capsule TAKE ONE CAPSULE BY MOUTH EVERY DAY 90 capsule 0  . HYDROcodone-acetaminophen (NORCO/VICODIN) 5-325 MG tablet Take 1 tablet by mouth every 6 (six) hours as needed for moderate pain. 30 tablet 0  . isosorbide mononitrate (IMDUR) 30 MG 24 hr tablet TAKE 1 TABLET BY MOUTH EVERY DAY 30 tablet 3  . metoprolol succinate (TOPROL-XL) 50 MG 24 hr tablet TAKE 1 & 1/2 TABLET BY MOUTH ONCE DAILY. 90 tablet 2  . nitroGLYCERIN (NITROSTAT) 0.4 MG SL tablet Place 1 tablet (0.4 mg total) under the tongue every 5 (five) minutes as needed for chest pain. 25 tablet 3  . ramipril (ALTACE) 2.5 MG capsule Take 1 capsule (2.5 mg total) by mouth daily. 90 capsule 1  . warfarin (COUMADIN) 7.5 MG tablet TAKE 1/2 TO 1 TABLET BY MOUTH ONCE DAILY AS DIRECTED. 90 tablet 0  . hydrochlorothiazide (MICROZIDE) 12.5 MG capsule Take 1 capsule (12.5  mg total) by mouth daily. 90 capsule 3   No current facility-administered medications for this visit.     Social History   Social History  . Marital status: Married    Spouse name: N/A  . Number of children: 2  . Years of education: N/A   Occupational History  . 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 Smoking status: Former Smoker    Packs/day: 0.50    Years: 2.00    Types: Cigarettes    Quit date: 09/03/1966  . Smokeless tobacco: Never Used  . Alcohol use 6.6 oz/week    7 Glasses of wine, 4 Cans of beer per week     Comment: a glass of wine with dinner/ beer on the weekend  . Drug use: No  . Sexual activity: Yes   Other Topics Concern  . Not on file   Social History Narrative  . No narrative on file    Family History  Problem Relation Age of Onset  . Heart attack Brother   . Colon cancer Neg Hx     ROS General: Negative; No fevers, chills, or night sweats HEENT: Negative; No changes in vision or hearing, sinus congestion,  difficulty swallowing Pulmonary: Negative; No cough, wheezing, shortness of breath, hemoptysis Cardiovascular:   See HPI;positive for CABG revascularization surgery, positive for atrial flutter ablation, positive for permanent atrial fibrillation;  GI: Negative; No nausea, vomiting, diarrhea, or abdominal pain GU: Negative; No dysuria, hematuria, or difficulty voiding Musculoskeletal: Positive for left hip surgery with revision; no myalgias, joint pain, or weakness Hematologic: Negative; no easy bruising, bleeding Endocrine: Negative; no heat/cold intolerance; no diabetes Neuro: Negative; no changes in balance, headaches Skin: Negative; No rashes or skin lesions Psychiatric: Negative; No behavioral problems, depression Sleep: Negative; No snoring,  daytime sleepiness, hypersomnolence, bruxism, restless legs, hypnogognic hallucinations. Other comprehensive 14 point system review is negative   Physical Exam BP 128/80   Pulse 79   Ht 5\' 11"  (1.803 m)   Wt 226 lb (102.5 kg)   BMI 31.52 kg/m    Repeat BP 118/78  Wt Readings from Last 3 Encounters:  05/21/17 226 lb (102.5 kg)  10/22/16 233 lb (105.7 kg)  05/18/16 220 lb 6.4 oz (100 kg)   General: Alert, oriented, no distress.  Skin: normal turgor, no rashes, warm and dry HEENT: Normocephalic, atraumatic. Pupils equal round and reactive to light; sclera anicteric; extraocular muscles intact;  Nose without nasal septal hypertrophy Mouth/Parynx benign; Mallinpatti scale Neck: No JVD, no carotid bruits; normal carotid upstroke Lungs: clear to ausculatation and percussion; no wheezing or rales Chest wall: without tenderness to palpitation Heart: PMI not displaced, irregularly irregular rhythm with a controlled ventricular response, s1 s2 normal, 1/6 systolic murmur, no diastolic murmur, no rubs, gallops, thrills, or heaves Abdomen: soft, nontender; no hepatosplenomehaly, BS+; abdominal aorta nontender and not dilated by palpation. Back:  no CVA tenderness Pulses 2+ Musculoskeletal: full range of motion, normal strength, no joint deformities Extremities: mild bilateral ankle edema; no clubbing cyanosis , Homan's sign negative  Neurologic: grossly nonfocal; Cranial nerves grossly wnl Psychologic: Normal mood and affect   ECG (independently read by me): atrial fibrillation with ventricular rate at 79 bpm.  QTc interval 447 ms.  No significant ST-T changes.  September 2017 ECG (independently read by me): Atrial fibrillation with controlled ventricular response at 65 bpm. QTc interval 436 ms.  June 2017 ECG (independently read by me): Atrial fibriillation of 78 bpm.  No  ST segment abnormalities.  01/26/2016 ECG (independently read by me): Atrial fibrillation with a ventricular rate at 70 bpm.  One PVC.  June 2016 ECG (independently read by me): Atrial fibrillation at 67 bpm.  No significant ST-T changes.  May 2015 ECG (independently read by me): Atrial fibrillation at 67 beats per minute.  Rate control.  QTc interval 420 ms.  LABS:  BMP Latest Ref Rng & Units 05/22/2017 02/02/2016 01/26/2016  Glucose 65 - 99 mg/dL 108(H) 102(H) 100(H)  BUN 8 - 27 mg/dL '11 11 14  '$ Creatinine 0.76 - 1.27 mg/dL 0.80 0.86 0.80  BUN/Creat Ratio 10 - 24 14 - 18  Sodium 134 - 144 mmol/L 139 139 144  Potassium 3.5 - 5.2 mmol/L 4.6 4.8 4.9  Chloride 96 - 106 mmol/L 105 109 106  CO2 20 - 29 mmol/L 18(L) 24 21  Calcium 8.6 - 10.2 mg/dL 9.2 9.1 9.3   Hepatic Function Latest Ref Rng & Units 05/22/2017 01/26/2016 07/08/2015  Total Protein 6.0 - 8.5 g/dL 6.7 6.6 6.1  Albumin 3.5 - 4.8 g/dL 4.3 4.3 4.1  AST 0 - 40 IU/L '25 23 24  '$ ALT 0 - 44 IU/L '29 22 23  '$ Alk Phosphatase 39 - 117 IU/L 110 74 69  Total Bilirubin 0.0 - 1.2 mg/dL 0.6 0.8 0.8   CBC Latest Ref Rng & Units 05/22/2017 02/02/2016 02/01/2016  WBC 3.4 - 10.8 x10E3/uL 7.7 6.9 -  Hemoglobin 13.0 - 17.7 g/dL 14.1 12.8(L) -  Hematocrit 37.5 - 51.0 % 39.6 39.4 -  Platelets 150 - 379 x10E3/uL 204 166 176    Lab Results  Component Value Date   MCV 96 05/22/2017   MCV 97.8 02/02/2016   MCV 95 01/26/2016   Lab Results  Component Value Date   TSH 1.580 05/22/2017   Lab Results  Component Value Date   HGBA1C 5.5 03/29/2014   Lipid Panel     Component Value Date/Time   CHOL 163 05/22/2017 0919   TRIG 58 05/22/2017 0919   HDL 78 05/22/2017 0919   CHOLHDL 2.1 05/22/2017 0919   CHOLHDL 2.8 01/13/2014 0737   VLDL 17 01/13/2014 0737   LDLCALC 73 05/22/2017 0919    RADIOLOGY: No results found.  IMPRESSION:  1. Chronic atrial fibrillation ([place])   2. Long term (current) use of anticoagulants   3. Hyperlipidemia LDL goal <70   4. Medication management   5. Ankle edema, bilateral     ASSESSMENT AND PLAN: Leonard Cisneros is a 76 year old WM who is 15 years status post CABG revascularization surgery by Dr. Roxy Manns in 2003 and has bilateral arterial conduits to his LAD and RCA, as well as a vein graft to the intermediate vessel.  He has permanent atrial fibrillation and is on chronic Coumadin therapy.   He has a history of hypertension and has been on long-acting diltiazem at 240 mg, Toprol-XL 50 mg, and ramipril 2.5 mg daily.   He had developed symptoms suggestive of unstable angina.which led to repeat cardiac catheterization on 02/01/2016.  He was found to have subtotal stenosis of the vein graft supplying his ramus immediate vessel, which was successfully intervened upon and treated with a bare metal stent.  He was on triple therapy including aspirin, Plavix, and Coumadin for one month and after one month Plavix was discontinued.  He was started on high potency statin therapy following his intervention and I discontinued his Zetia.  He used to be on warfarin for anticoagulation.  Exam today  his blood pressure is stable.  His atrial fibrillation rate is well-controlled.  He is not having any bleeding on Coumadin.he is not having any anginal symptomatology on isosorbide, Toprol, diltiazem, and  ramipril.  His INR is being followed by Dr. Geralyn Flash.  He has experienced some leg swelling.  I am adding HCTZ 12.5 mg to his medical regimen.  He will continue to exercise regularly.  He is mildly obese with a BMI of 31.52. I will see him in one year for cardiology reevaluation.    Time spent: 25 minutes  [name], MD, [facility]  05/23/2017 3:47 PM

## 2017-05-21 NOTE — Patient Instructions (Signed)
Medication Instructions:  START HCTZ 12.5 mg (1 tablet) daily  Labwork: Please have FASTING labs (CBC, CMET, Lipid, TSH).  Testing/Procedures: NONE  Follow-Up: Your physician wants you to follow-up in: 1 YEAR with Dr. Claiborne Billings.  You will receive a reminder letter in the mail two months in advance. If you don't receive a letter, please call our office to schedule the follow-up appointment.   Any Other Special Instructions Will Be Listed Below (If Applicable).     If you need a refill on your cardiac medications before your next appointment, please call your pharmacy.

## 2017-05-22 DIAGNOSIS — Z7901 Long term (current) use of anticoagulants: Secondary | ICD-10-CM | POA: Diagnosis not present

## 2017-05-22 DIAGNOSIS — Z79899 Other long term (current) drug therapy: Secondary | ICD-10-CM | POA: Diagnosis not present

## 2017-05-22 DIAGNOSIS — I482 Chronic atrial fibrillation: Secondary | ICD-10-CM | POA: Diagnosis not present

## 2017-05-22 DIAGNOSIS — E785 Hyperlipidemia, unspecified: Secondary | ICD-10-CM | POA: Diagnosis not present

## 2017-05-23 LAB — COMPREHENSIVE METABOLIC PANEL
ALBUMIN: 4.3 g/dL (ref 3.5–4.8)
ALK PHOS: 110 IU/L (ref 39–117)
ALT: 29 IU/L (ref 0–44)
AST: 25 IU/L (ref 0–40)
Albumin/Globulin Ratio: 1.8 (ref 1.2–2.2)
BUN / CREAT RATIO: 14 (ref 10–24)
BUN: 11 mg/dL (ref 8–27)
Bilirubin Total: 0.6 mg/dL (ref 0.0–1.2)
CALCIUM: 9.2 mg/dL (ref 8.6–10.2)
CO2: 18 mmol/L — AB (ref 20–29)
CREATININE: 0.8 mg/dL (ref 0.76–1.27)
Chloride: 105 mmol/L (ref 96–106)
GFR calc Af Amer: 101 mL/min/{1.73_m2} (ref >59)
GFR, EST NON AFRICAN AMERICAN: 87 mL/min/{1.73_m2} (ref >59)
GLOBULIN, TOTAL: 2.4 g/dL (ref 1.5–4.5)
GLUCOSE: 108 mg/dL — AB (ref 65–99)
Potassium: 4.6 mmol/L (ref 3.5–5.2)
Sodium: 139 mmol/L (ref 134–144)
Total Protein: 6.7 g/dL (ref 6.0–8.5)

## 2017-05-23 LAB — CBC
HEMATOCRIT: 39.6 % (ref 37.5–51.0)
HEMOGLOBIN: 14.1 g/dL (ref 13.0–17.7)
MCH: 34.2 pg — ABNORMAL HIGH (ref 26.6–33.0)
MCHC: 35.6 g/dL (ref 31.5–35.7)
MCV: 96 fL (ref 79–97)
Platelets: 204 10*3/uL (ref 150–379)
RBC: 4.12 x10E6/uL — AB (ref 4.14–5.80)
RDW: 12.9 % (ref 12.3–15.4)
WBC: 7.7 10*3/uL (ref 3.4–10.8)

## 2017-05-23 LAB — LIPID PANEL
CHOLESTEROL TOTAL: 163 mg/dL (ref 100–199)
Chol/HDL Ratio: 2.1 ratio (ref 0.0–5.0)
HDL: 78 mg/dL (ref >39)
LDL CALC: 73 mg/dL (ref 0–99)
TRIGLYCERIDES: 58 mg/dL (ref 0–149)
VLDL CHOLESTEROL CAL: 12 mg/dL (ref 5–40)

## 2017-05-23 LAB — TSH: TSH: 1.58 u[IU]/mL (ref 0.450–4.500)

## 2017-05-28 ENCOUNTER — Other Ambulatory Visit: Payer: Self-pay | Admitting: Cardiovascular Disease

## 2017-06-06 ENCOUNTER — Encounter: Payer: Self-pay | Admitting: Internal Medicine

## 2017-06-12 ENCOUNTER — Other Ambulatory Visit: Payer: Self-pay | Admitting: Cardiovascular Disease

## 2017-06-12 DIAGNOSIS — Z7901 Long term (current) use of anticoagulants: Secondary | ICD-10-CM | POA: Diagnosis not present

## 2017-06-12 LAB — COAGUCHEK XS/INR WAIVED
INR: 2.6 — ABNORMAL HIGH (ref 0.9–1.1)
PROTHROMBIN TIME: 30.8 s

## 2017-06-13 ENCOUNTER — Ambulatory Visit (INDEPENDENT_AMBULATORY_CARE_PROVIDER_SITE_OTHER): Payer: Medicare HMO | Admitting: Pharmacist Clinician (PhC)/ Clinical Pharmacy Specialist

## 2017-06-13 DIAGNOSIS — Z7901 Long term (current) use of anticoagulants: Secondary | ICD-10-CM

## 2017-06-13 DIAGNOSIS — I482 Chronic atrial fibrillation, unspecified: Secondary | ICD-10-CM

## 2017-06-16 ENCOUNTER — Other Ambulatory Visit: Payer: Self-pay | Admitting: Cardiovascular Disease

## 2017-06-17 NOTE — Telephone Encounter (Signed)
Rx(s) sent to pharmacy electronically.  

## 2017-06-18 ENCOUNTER — Other Ambulatory Visit: Payer: Self-pay | Admitting: Cardiovascular Disease

## 2017-06-21 DIAGNOSIS — R69 Illness, unspecified: Secondary | ICD-10-CM | POA: Diagnosis not present

## 2017-06-27 ENCOUNTER — Other Ambulatory Visit: Payer: Self-pay | Admitting: Cardiovascular Disease

## 2017-07-04 ENCOUNTER — Telehealth: Payer: Self-pay

## 2017-07-04 NOTE — Telephone Encounter (Signed)
[phone] patient due for tcs recall,  No current gi issues

## 2017-07-08 ENCOUNTER — Telehealth: Payer: Self-pay

## 2017-07-08 NOTE — Telephone Encounter (Signed)
Pt is on coumadin and is scheduled an Ov with Neil Crouch, PA on 08/20/2017 at 8:00 AM.

## 2017-07-08 NOTE — Telephone Encounter (Signed)
See previous note

## 2017-07-10 ENCOUNTER — Other Ambulatory Visit: Payer: Self-pay | Admitting: Cardiovascular Disease

## 2017-07-10 NOTE — Telephone Encounter (Signed)
Rx has been sent to the pharmacy electronically. ° °

## 2017-07-11 ENCOUNTER — Other Ambulatory Visit: Payer: Self-pay | Admitting: Cardiovascular Disease

## 2017-07-11 NOTE — Telephone Encounter (Signed)
Rx(s) sent to pharmacy electronically.  

## 2017-07-30 ENCOUNTER — Other Ambulatory Visit: Payer: Self-pay | Admitting: Cardiovascular Disease

## 2017-07-30 DIAGNOSIS — Z7901 Long term (current) use of anticoagulants: Secondary | ICD-10-CM | POA: Diagnosis not present

## 2017-07-30 LAB — COAGUCHEK XS/INR WAIVED
INR: 2.8 — ABNORMAL HIGH (ref 0.9–1.1)
Prothrombin Time: 34 s

## 2017-08-01 ENCOUNTER — Ambulatory Visit (INDEPENDENT_AMBULATORY_CARE_PROVIDER_SITE_OTHER): Payer: Medicare HMO | Admitting: Pharmacist Clinician (PhC)/ Clinical Pharmacy Specialist

## 2017-08-01 DIAGNOSIS — Z7901 Long term (current) use of anticoagulants: Secondary | ICD-10-CM | POA: Diagnosis not present

## 2017-08-01 DIAGNOSIS — I482 Chronic atrial fibrillation, unspecified: Secondary | ICD-10-CM

## 2017-08-20 ENCOUNTER — Ambulatory Visit: Payer: Medicare HMO | Admitting: Gastroenterology

## 2017-08-20 ENCOUNTER — Encounter: Payer: Self-pay | Admitting: Gastroenterology

## 2017-08-20 VITALS — BP 120/70 | HR 101 | Temp 97.2°F | Ht 71.0 in | Wt 214.4 lb

## 2017-08-20 DIAGNOSIS — Z86010 Personal history of colonic polyps: Secondary | ICD-10-CM | POA: Diagnosis not present

## 2017-08-20 DIAGNOSIS — Z7901 Long term (current) use of anticoagulants: Secondary | ICD-10-CM | POA: Diagnosis not present

## 2017-08-20 NOTE — Patient Instructions (Signed)
1. We will contact you to schedule colonoscopy once Dr. Evette Georges office gives instructions regarding your coumadin. If you have not heard anything within next 48 hours, please call us at [phone].

## 2017-08-20 NOTE — Progress Notes (Signed)
cc'ed to pcp °

## 2017-08-20 NOTE — Progress Notes (Signed)
cc'd to pcp 

## 2017-08-20 NOTE — Assessment & Plan Note (Signed)
76 year old gentleman on chronic Coumadin for A. fib.  Resents for surveillance colonoscopy for history of adenomatous colon polyp in 2013.  2013 his screening colonoscopy that was done on Coumadin, patient did very well.  To discuss management of Coumadin with Dr. Gala Romney as far as pursuing procedure on Coumadin or not. Further recommendations to follow.

## 2017-08-20 NOTE — Progress Notes (Signed)
Primary Care Physician:  [name], MD  Primary Gastroenterologist:  Garfield Cornea, MD   Chief Complaint  Patient presents with  . Colonoscopy    consult, no problems    HPI:  Leonard Cisneros is a 76 y.o. male here to schedule surveillance colonoscopy for history of adenomatous colon polyp.  He is on chronic Coumadin for history of A. fib.  Last coronary artery stent placement May 2017.  Clinically he is doing well. No constipation, diarrhea, melena, brbpr, abdominal pain. No heartburn, vomiting, dysphagia.    Back in 2013 patient had screening colonoscopy while on Coumadin therapy with removal of small colon polyp and did very well without any bleeding.  Current Outpatient Medications  Medication Sig Dispense Refill  . aspirin EC 81 MG tablet Take 1 tablet (81 mg total) by mouth daily. 90 tablet 3  . diltiazem (DILACOR XR) 240 MG 24 hr capsule Take 1 capsule (240 mg total) by mouth daily. KEEP OV. 90 capsule 3  . diltiazem (TIAZAC) 240 MG 24 hr capsule TAKE ONE CAPSULE BY MOUTH EVERY DAY 90 capsule 2  . hydrochlorothiazide (MICROZIDE) 12.5 MG capsule Take 1 capsule (12.5 mg total) by mouth daily. 90 capsule 3  . HYDROcodone-acetaminophen (NORCO/VICODIN) 5-325 MG tablet Take 1 tablet by mouth every 6 (six) hours as needed for moderate pain. 30 tablet 0  . isosorbide mononitrate (IMDUR) 30 MG 24 hr tablet TAKE 1 TABLET BY MOUTH EVERY DAY 30 tablet 6  . metoprolol succinate (TOPROL-XL) 50 MG 24 hr tablet TAKE 1 & 1/2 TABLETS BY MOUTH EVERY DAY 90 tablet 2  . nitroGLYCERIN (NITROSTAT) 0.4 MG SL tablet Place 1 tablet (0.4 mg total) under the tongue every 5 (five) minutes as needed for chest pain. 25 tablet 3  . ramipril (ALTACE) 2.5 MG capsule TAKE ONE CAPSULE BY MOUTH EVERY DAY 90 capsule 2  . warfarin (COUMADIN) 7.5 MG tablet TAKE 1/2 TO 1 TABLET BY MOUTH DAILY AS DIRECTED. NEED INR CHECK FOR FURTHER REFILLS 30 tablet 3   No current facility-administered medications for this visit.      Allergies as of 08/20/2017 - Review Complete 08/20/2017  Allergen Reaction Noted  . Augmentin [amoxicillin-pot clavulanate] Nausea And Vomiting 01/14/2013    Past Medical History:  Diagnosis Date  . Atrial flutter ([place])    a. s/p remote ablation by Dr. [name].  [name] CAD (coronary artery disease)    a. s/p CABG 2003. b. 01/2016: unstable angina - s/p BMS to SVG-ramus intermediate, patent LIMA-mLAD, patent RIMA-dRCA.   [name] CHF (congestive heart failure) ([place])   . Chronic atrial fibrillation ([facility])    a. initially post op CABG then chronic. On Coumadin.  . Chronic lower back pain   . Essential hypertension   . High cholesterol   . Ischemic cardiomyopathy    a. Cath 01/2016 -  EF 50-55% with mild distal inferior hypocontractility.  [name] Unspecified hereditary and idiopathic peripheral neuropathy 04/04/2014    Past Surgical History:  Procedure Laterality Date  . BACK SURGERY    . CARDIAC CATHETERIZATION  ~ 2000; 2003  . CARDIAC CATHETERIZATION N/A 02/01/2016   Procedure: Left Heart Cath and Cors/Grafts Angiography;  Surgeon: [name], MD;  Location: [facility];  Service: Cardiovascular;  Laterality: N/A;  . CARDIAC CATHETERIZATION N/A 02/01/2016   Procedure: Coronary Stent Intervention;  Surgeon: [name], MD;  Location: [facility];  Service: Cardiovascular;  Laterality: N/A;  . COLONOSCOPY  06/04/2002   Normal  colon and rectum  . COLONOSCOPY  07/08/2012   Procedure: COLONOSCOPY;  Surgeon: [name], MD;  Location: [facility];  Service: Endoscopy;  Laterality: N/A;  11:30  . CORONARY ANGIOPLASTY WITH STENT PLACEMENT  02/01/2016  . CORONARY ARTERY BYPASS GRAFT  2003   LIMA to LAD,LIMA to distal RCA,SVG to ramus intermediate vessel.  . FOREIGN BODY REMOVAL Right 04/02/2014   Procedure: FOREIGN BODY REMOVAL RIGHT FOOT;  Surgeon: [name], MD;  Location: [facility];  Service: General;  Laterality: Right;  . JOINT REPLACEMENT    . JOINT REPLACEMENT    .  LUMBAR DISC SURGERY    . REVISION TOTAL HIP ARTHROPLASTY Bilateral 2005-2012   right-left  . SHOULDER OPEN ROTATOR CUFF REPAIR Right   . TOTAL HIP ARTHROPLASTY Right 1999  . TOTAL HIP ARTHROPLASTY Left 2008  . 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    Family History  Problem Relation Age of Onset  . Heart attack Brother   . Colon cancer Neg Hx     Social History   Socioeconomic History  . Marital status: Married    Spouse name: Not on file  . Number of children: 2  . Years of education: Not on file  . Highest education level: Not on file  Social Needs  . Financial resource strain: Not on file  . Food insecurity - worry: Not on file  . Food insecurity - inability: Not on file  . Transportation needs - medical: Not on file  . Transportation needs - non-medical: Not on file  Occupational History  . 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 Smoking status: Former Smoker    Packs/day: 0.50    Years: 2.00    Pack years: 1.00    Types: Cigarettes    Last attempt to quit: 09/03/1966    Years since quitting: 50.9  . Smokeless tobacco: Never Used  Substance and Sexual Activity  . Alcohol use: Yes    Alcohol/week: 6.6 oz    Types: 7 Glasses of wine, 4 Cans of beer per week    Comment: a glass of wine with dinner/ beer on the weekend  . Drug use: No  . Sexual activity: Yes  Other Topics Concern  . Not on file  Social History Narrative  . Not on file      ROS:  General: Negative for anorexia, weight loss, fever, chills, fatigue, weakness. Eyes: Negative for vision changes.  ENT: Negative for hoarseness, difficulty swallowing , nasal congestion. CV: Negative for chest pain, angina, palpitations, dyspnea on exertion, peripheral edema.  Respiratory:  Negative for dyspnea at rest, dyspnea on exertion, cough, sputum, wheezing.  GI: See history of present illness. GU:  Negative for dysuria, hematuria, urinary incontinence, urinary frequency, nocturnal urination.  MS: Negative for joint pain, low back pain.  Derm: Negative for rash or itching.  Neuro: Negative for weakness, abnormal sensation, seizure, frequent headaches, memory loss, confusion.  Psych: Negative for anxiety, depression, suicidal ideation, hallucinations.  Endo: Negative for unusual weight change.  Heme: Negative for bruising or bleeding. Allergy: Negative for rash or hives.    Physical Examination:  BP 120/70   Pulse (!) 101   Temp (!) 97.2 F (36.2 C) (Oral)   Ht 5\' 11"  (1.803 m)   Wt  214 lb 6.4 oz (97.3 kg)   BMI 29.90 kg/m    General: Well-nourished, well-developed in no acute distress.  Head: Normocephalic, atraumatic.   Eyes: Conjunctiva pink, no icterus. Mouth: Oropharyngeal mucosa moist and pink , no lesions erythema or exudate. Neck: Supple without thyromegaly, masses, or lymphadenopathy.  Lungs: Clear to auscultation bilaterally.  Heart: Regular rate and rhythm, no murmurs rubs or gallops.  Abdomen: Bowel sounds are normal, nontender, nondistended, no hepatosplenomegaly or masses, no abdominal bruits or    hernia , no rebound or guarding.   Rectal: Not performed Extremities: No lower extremity edema. No clubbing or deformities.  Neuro: Alert and oriented x 4 , grossly normal neurologically.  Skin: Warm and dry, no rash or jaundice.   Psych: Alert and cooperative, normal mood and affect.  Labs: Lab Results  Component Value Date   WBC 7.7 05/22/2017   HGB 14.1 05/22/2017   HCT 39.6 05/22/2017   MCV 96 05/22/2017   PLT 204 05/22/2017   Lab Results  Component Value Date   CREATININE 0.80 05/22/2017   BUN 11 05/22/2017   NA 139 05/22/2017   K 4.6 05/22/2017   CL 105 05/22/2017   CO2 18 (L) 05/22/2017   Lab Results  Component Value Date    ALT 29 05/22/2017   AST 25 05/22/2017   ALKPHOS 110 05/22/2017   BILITOT 0.6 05/22/2017   Lab Results  Component Value Date   INR 2.8 (H) 07/30/2017   INR 2.6 (H) 06/12/2017   INR 2.2 (H) 04/22/2017     Imaging Studies: No results found.

## 2017-08-26 ENCOUNTER — Telehealth: Payer: Self-pay | Admitting: *Deleted

## 2017-08-26 MED ORDER — WARFARIN SODIUM 7.5 MG PO TABS: ORAL_TABLET | ORAL | 3 refills | Status: DC

## 2017-08-26 NOTE — Telephone Encounter (Signed)
Refill sent.

## 2017-08-28 ENCOUNTER — Other Ambulatory Visit: Payer: Self-pay

## 2017-08-28 MED ORDER — WARFARIN SODIUM 7.5 MG PO TABS: ORAL_TABLET | ORAL | 3 refills | Status: DC

## 2017-08-29 DIAGNOSIS — R69 Illness, unspecified: Secondary | ICD-10-CM | POA: Diagnosis not present

## 2017-09-04 ENCOUNTER — Other Ambulatory Visit: Payer: Self-pay | Admitting: *Deleted

## 2017-09-04 ENCOUNTER — Telehealth: Payer: Self-pay | Admitting: Gastroenterology

## 2017-09-04 ENCOUNTER — Encounter: Payer: Self-pay | Admitting: *Deleted

## 2017-09-04 DIAGNOSIS — Z86010 Personal history of colonic polyps: Secondary | ICD-10-CM

## 2017-09-04 MED ORDER — NA SULFATE-K SULFATE-MG SULF 17.5-3.13-1.6 GM/177ML PO SOLN: 1.0000 | ORAL | 0 refills | Status: DC

## 2017-09-04 NOTE — Telephone Encounter (Signed)
Patient with diagnosis of atrial fibrillation on warfarin for anticoagulation.    Procedure: colonoscopy Date of procedure: tbd  CHADS2-VASc score of  5 (CHF, HTN, AGE x 2,CAD)  CrCl 108 Platelet count 204  Per office protocol, patient can hold warfarin for 4 days prior to procedure.   Patient will not need bridging with Lovenox (enoxaparin) around procedure.  If not bridging, patient should restart warfarin on the evening of procedure or day after, at discretion of procedure MD

## 2017-09-04 NOTE — Telephone Encounter (Signed)
OK to schedule colonoscopy. Hold coumadin four days prior to procedure.

## 2017-09-04 NOTE — Telephone Encounter (Signed)
Spoke with pt and TCS scheduled for 10/02/17 at 10:30am. Confirmed pt pharmacy. Instructions mailed to pt. Patient also aware will need to hold coumadin 4 days prior to procedure.

## 2017-09-04 NOTE — Telephone Encounter (Signed)
Dr. Gala Romney is planning on colonoscopy on patient and would prefer him to come off anticoagulation.   Can we hold coumadin for four days before procedure? Does he need Lovenox bridge?

## 2017-09-09 ENCOUNTER — Telehealth: Payer: Self-pay | Admitting: Internal Medicine

## 2017-09-09 ENCOUNTER — Other Ambulatory Visit: Payer: Self-pay | Admitting: Cardiovascular Disease

## 2017-09-09 DIAGNOSIS — Z7901 Long term (current) use of anticoagulants: Secondary | ICD-10-CM | POA: Diagnosis not present

## 2017-09-09 LAB — COAGUCHEK XS/INR WAIVED
INR: 3.3 — AB (ref 0.9–1.1)
PROTHROMBIN TIME: 39.3 s

## 2017-09-09 NOTE — Telephone Encounter (Signed)
[phone]  Please call patient, he has question s about insurance and his tcs

## 2017-09-09 NOTE — Telephone Encounter (Signed)
Spoke with pt. He wanted to know how much he would have to pay for his TCS. Advised pt he would need to contact his insurance and they will let him know. Nothing further needed

## 2017-09-10 ENCOUNTER — Ambulatory Visit (INDEPENDENT_AMBULATORY_CARE_PROVIDER_SITE_OTHER): Payer: Medicare HMO | Admitting: Pharmacist Clinician (PhC)/ Clinical Pharmacy Specialist

## 2017-09-10 DIAGNOSIS — Z7901 Long term (current) use of anticoagulants: Secondary | ICD-10-CM | POA: Diagnosis not present

## 2017-09-10 DIAGNOSIS — I482 Chronic atrial fibrillation, unspecified: Secondary | ICD-10-CM

## 2017-09-17 ENCOUNTER — Encounter: Payer: Self-pay | Admitting: Family Medicine

## 2017-09-17 ENCOUNTER — Ambulatory Visit (INDEPENDENT_AMBULATORY_CARE_PROVIDER_SITE_OTHER): Payer: Medicare HMO | Admitting: Family Medicine

## 2017-09-17 VITALS — BP 122/78 | Ht 70.0 in | Wt 227.1 lb

## 2017-09-17 DIAGNOSIS — Z23 Encounter for immunization: Secondary | ICD-10-CM

## 2017-09-17 DIAGNOSIS — Z0000 Encounter for general adult medical examination without abnormal findings: Secondary | ICD-10-CM

## 2017-09-17 NOTE — Progress Notes (Signed)
   Subjective:    Patient ID: [name], male    DOB: 1941/03/04, 77 y.o.   MRN: [id number]  HPI  The patient comes in today for a wellness visit.  walkng and exrcising and   A review of their health history was completed.  A review of medications was also completed.  Any needed refills; No  Eating habits: Good  Falls/  MVA accidents in past few months: None  Re see the due to see with the nurse gular exercise: Yes  Specialist pt sees on regular basis: [facility] Cardiologist  Preventative health issues were discussed.   Additional concerns: none  Three days per [facility]  Colonoscopy due later this mo       Flu shot this yr Review of Systems  Constitutional: Negative for activity change, appetite change and fever.  HENT: Negative for congestion and rhinorrhea.   Eyes: Negative for discharge.  Respiratory: Negative for cough and wheezing.   Cardiovascular: Negative for chest pain.  Gastrointestinal: Negative for abdominal pain, blood in stool and vomiting.  Genitourinary: Negative for difficulty urinating and frequency.  Musculoskeletal: Negative for neck pain.  Skin: Negative for rash.  Allergic/Immunologic: Negative for environmental allergies and food allergies.  Neurological: Negative for weakness and headaches.  Psychiatric/Behavioral: Negative for agitation.  All other systems reviewed and are negative.      Objective:   Physical Exam  Constitutional: He appears well-developed and well-nourished.  HENT:  Head: Normocephalic and atraumatic.  Right Ear: External ear normal.  Left Ear: External ear normal.  Nose: Nose normal.  Mouth/Throat: Oropharynx is clear and moist.  Eyes: EOM are normal. Pupils are equal, round, and reactive to light.  Neck: Normal range of motion. Neck supple. No thyromegaly present.  Cardiovascular: Normal rate, regular rhythm and normal heart sounds.  No murmur heard. Pulmonary/Chest: Effort normal and breath sounds normal.  No respiratory distress. He has no wheezes.  Abdominal: Soft. Bowel sounds are normal. He exhibits no distension and no mass. There is no tenderness.  Genitourinary: Penis normal.  Musculoskeletal: Normal range of motion. He exhibits no edema.  Lymphadenopathy:    He has no cervical adenopathy.  Neurological: He is alert. He exhibits normal muscle tone.  Skin: Skin is warm and dry. No erythema.  Psychiatric: He has a normal mood and affect. His behavior is normal. Judgment normal.  Vitals reviewed.         Assessment & Plan:  Impression wellness exam.  Vaccines discussed and are reviewed.  Will administer pneumococcal vaccine today.  Up-to-date on colonoscopy Just 4 weeks.  Diet exercise discussed.  Mental health reviewed.  No falls.  Blood work reviewed.  Elevated glucose last blood work.  Which means patient does have element of prediabetes.  Discussed.  Appropriate dietary measures in this regard discussed.  Patient mention chronic back pain and encouraged him to return for a full visit if he would like my input

## 2017-10-02 ENCOUNTER — Encounter (HOSPITAL_COMMUNITY)
Admission: RE | Disposition: A | Discharge status: Home / Self Care | Payer: Self-pay | Source: Ambulatory Visit | Attending: Internal Medicine

## 2017-10-02 ENCOUNTER — Other Ambulatory Visit: Payer: Self-pay

## 2017-10-02 ENCOUNTER — Ambulatory Visit (HOSPITAL_COMMUNITY)
Admission: RE | Admit: 2017-10-02 | Discharge: 2017-10-02 | Disposition: A | Discharge status: Home / Self Care | Payer: Medicare HMO | Source: Ambulatory Visit | Attending: Internal Medicine | Admitting: Internal Medicine

## 2017-10-02 ENCOUNTER — Encounter (HOSPITAL_COMMUNITY): Payer: Self-pay | Admitting: *Deleted

## 2017-10-02 DIAGNOSIS — Z87891 Personal history of nicotine dependence: Secondary | ICD-10-CM | POA: Insufficient documentation

## 2017-10-02 DIAGNOSIS — Z1211 Encounter for screening for malignant neoplasm of colon: Secondary | ICD-10-CM | POA: Diagnosis not present

## 2017-10-02 DIAGNOSIS — Z79899 Other long term (current) drug therapy: Secondary | ICD-10-CM | POA: Diagnosis not present

## 2017-10-02 DIAGNOSIS — I25110 Atherosclerotic heart disease of native coronary artery with unstable angina pectoris: Secondary | ICD-10-CM | POA: Insufficient documentation

## 2017-10-02 DIAGNOSIS — Z96643 Presence of artificial hip joint, bilateral: Secondary | ICD-10-CM | POA: Insufficient documentation

## 2017-10-02 DIAGNOSIS — Z951 Presence of aortocoronary bypass graft: Secondary | ICD-10-CM | POA: Diagnosis not present

## 2017-10-02 DIAGNOSIS — E7800 Pure hypercholesterolemia, unspecified: Secondary | ICD-10-CM | POA: Diagnosis not present

## 2017-10-02 DIAGNOSIS — Z7901 Long term (current) use of anticoagulants: Secondary | ICD-10-CM | POA: Insufficient documentation

## 2017-10-02 DIAGNOSIS — I482 Chronic atrial fibrillation: Secondary | ICD-10-CM | POA: Insufficient documentation

## 2017-10-02 DIAGNOSIS — Z7982 Long term (current) use of aspirin: Secondary | ICD-10-CM | POA: Insufficient documentation

## 2017-10-02 DIAGNOSIS — G8929 Other chronic pain: Secondary | ICD-10-CM | POA: Diagnosis not present

## 2017-10-02 DIAGNOSIS — I255 Ischemic cardiomyopathy: Secondary | ICD-10-CM | POA: Diagnosis not present

## 2017-10-02 DIAGNOSIS — G589 Mononeuropathy, unspecified: Secondary | ICD-10-CM | POA: Diagnosis not present

## 2017-10-02 DIAGNOSIS — Z86010 Personal history of colonic polyps: Secondary | ICD-10-CM | POA: Diagnosis not present

## 2017-10-02 DIAGNOSIS — I4892 Unspecified atrial flutter: Secondary | ICD-10-CM | POA: Insufficient documentation

## 2017-10-02 DIAGNOSIS — Z881 Allergy status to other antibiotic agents status: Secondary | ICD-10-CM | POA: Insufficient documentation

## 2017-10-02 DIAGNOSIS — G629 Polyneuropathy, unspecified: Secondary | ICD-10-CM | POA: Diagnosis not present

## 2017-10-02 DIAGNOSIS — M545 Low back pain: Secondary | ICD-10-CM | POA: Insufficient documentation

## 2017-10-02 DIAGNOSIS — I10 Essential (primary) hypertension: Secondary | ICD-10-CM | POA: Diagnosis not present

## 2017-10-02 HISTORY — PX: COLONOSCOPY: SHX5424

## 2017-10-02 SURGERY — COLONOSCOPY
Anesthesia: Moderate Sedation

## 2017-10-02 MED ORDER — MEPERIDINE HCL 100 MG/ML IJ SOLN
INTRAMUSCULAR | Status: AC
  Filled 2017-10-02: qty 2

## 2017-10-02 MED ORDER — STERILE WATER FOR IRRIGATION IR SOLN
Status: DC | PRN
  Administered 2017-10-02: 10:00:00

## 2017-10-02 MED ORDER — SODIUM CHLORIDE 0.9 % IV SOLN
INTRAVENOUS | Status: DC
  Administered 2017-10-02: 10:00:00 via INTRAVENOUS

## 2017-10-02 MED ORDER — MIDAZOLAM HCL 5 MG/5ML IJ SOLN
INTRAMUSCULAR | Status: AC
  Filled 2017-10-02: qty 10

## 2017-10-02 MED ORDER — MEPERIDINE HCL 100 MG/ML IJ SOLN
INTRAMUSCULAR | Status: DC | PRN
  Administered 2017-10-02: 25 mg via INTRAVENOUS
  Administered 2017-10-02: 50 mg via INTRAVENOUS

## 2017-10-02 MED ORDER — ONDANSETRON HCL 4 MG/2ML IJ SOLN
INTRAMUSCULAR | Status: AC
  Filled 2017-10-02: qty 2

## 2017-10-02 MED ORDER — ONDANSETRON HCL 4 MG/2ML IJ SOLN
INTRAMUSCULAR | Status: DC | PRN
  Administered 2017-10-02: 4 mg via INTRAVENOUS

## 2017-10-02 MED ORDER — MIDAZOLAM HCL 5 MG/5ML IJ SOLN
INTRAMUSCULAR | Status: DC | PRN
  Administered 2017-10-02: 1 mg via INTRAVENOUS
  Administered 2017-10-02: 2 mg via INTRAVENOUS

## 2017-10-02 NOTE — Op Note (Signed)
[facility] Patient Name: Leonard Cisneros Procedure Date: 10/02/2017 9:57 AM MRN: [id number] Date of Birth: 02-25-1941 Attending MD: Norvel Richards , MD CSN: [id number] Age: 77 Admit Type: Outpatient Procedure:                Colonoscopy Indications:              High risk colon cancer surveillance: Personal                            history of colonic polyps Providers:                Norvel Richards, MD, Janeece Riggers, RN, Nelma Rothman, Technician Referring MD:              Medicines:                Midazolam 3 mg IV, Meperidine 75 mg IV Complications:            No immediate complications. Estimated Blood Loss:     Estimated blood loss: none. Procedure:                Pre-Anesthesia Assessment:                           - Prior to the procedure, a History and Physical                            was performed, and patient medications and                            allergies were reviewed. The patient's tolerance of                            previous anesthesia was also reviewed. The risks                            and benefits of the procedure and the sedation                            options and risks were discussed with the patient.                            All questions were answered, and informed consent                            was obtained. Prior Anticoagulants: The patient                            last took Coumadin (warfarin) 4 days prior to the                            procedure. ASA Grade Assessment: III - A patient  with severe systemic disease. After reviewing the                            risks and benefits, the patient was deemed in                            satisfactory condition to undergo the procedure.                           After obtaining informed consent, the colonoscope                            was passed under direct vision. Throughout the                            procedure,  the patient's blood pressure, pulse, and                            oxygen saturations were monitored continuously. The                            EC-[id number] ([id number]) scope was introduced through                            the anus and advanced to the the cecum, identified                            by appendiceal orifice and ileocecal valve. The                            colonoscopy was performed without difficulty. The                            patient tolerated the procedure well. The quality                            of the bowel preparation was adequate. The                            ileocecal valve, appendiceal orifice, and rectum                            were photographed. Scope In: 10:16:45 AM Scope Out: 10:25:00 AM Scope Withdrawal Time: 0 hours 5 minutes 29 seconds  Total Procedure Duration: 0 hours 8 minutes 15 seconds  Findings:      The perianal and digital rectal examinations were normal.      The colon (entire examined portion) appeared normal.      The retroflexed view of the distal rectum and anal verge was normal and       showed no anal or rectal abnormalities. Impression:               - The entire examined colon is normal.                           -  The distal rectum and anal verge are normal on                            retroflexion view.                           - No specimens collected. Moderate Sedation:      Moderate (conscious) sedation was administered by the endoscopy nurse       and supervised by the endoscopist. The following parameters were       monitored: oxygen saturation, heart rate, blood pressure, respiratory       rate, EKG, adequacy of pulmonary ventilation, and response to care.       Total physician intraservice time was 17 minutes. Recommendation:           - Patient has a contact number available for                            emergencies. The signs and symptoms of potential                            delayed complications were  discussed with the                            patient. Return to normal activities tomorrow.                            Written discharge instructions were provided to the                            patient.                           - Resume previous diet.                           - Continue present medications. Resume Coumadin                            today.                           - No repeat colonoscopy due to age.                           - Return to GI clinic PRN. Procedure Code(s):        --- Professional ---                           [phone], Colonoscopy, flexible; diagnostic, including                            collection of specimen(s) by brushing or washing,                            when performed (separate procedure)  22979, Moderate sedation services provided by the                            same physician or other qualified health care                            professional performing the diagnostic or                            therapeutic service that the sedation supports,                            requiring the presence of an independent trained                            observer to assist in the monitoring of the                            patient's level of consciousness and physiological                            status; initial 15 minutes of intraservice time,                            patient age 26 years or older Diagnosis Code(s):        --- Professional ---                           Z86.010, Personal history of colonic polyps CPT copyright 2016 [employer]. All rights reserved. The codes documented in this report are preliminary and upon coder review may  be revised to meet current compliance requirements. Cristopher Estimable. Holland Nickson, MD Norvel Richards, MD 10/02/2017 10:35:31 AM This report has been signed electronically. Number of Addenda: 0

## 2017-10-02 NOTE — H&P (Signed)
$'@LOGO'z$ @   Primary Care Physician:  [name], MD Primary Gastroenterologist:  Dr. Gala Romney  Pre-Procedure History & Physical: HPI:  Leonard Cisneros is a 77 y.o. male here for as well as colonoscopy. History of colonic polyps.  Past Medical History:  Diagnosis Date  . Atrial flutter ([place])    a. s/p remote ablation by Dr. [name].  [name] CAD (coronary artery disease)    a. s/p CABG 2003. b. 01/2016: unstable angina - s/p BMS to SVG-ramus intermediate, patent LIMA-mLAD, patent RIMA-dRCA.   [name] CHF (congestive heart failure) ([place])   . Chronic atrial fibrillation ([facility])    a. initially post op CABG then chronic. On Coumadin.  . Chronic lower back pain   . Essential hypertension   . High cholesterol   . Ischemic cardiomyopathy    a. Cath 01/2016 -  EF 50-55% with mild distal inferior hypocontractility.  [name] Unspecified hereditary and idiopathic peripheral neuropathy 04/04/2014    Past Surgical History:  Procedure Laterality Date  . BACK SURGERY    . CARDIAC CATHETERIZATION  ~ 2000; 2003  . CARDIAC CATHETERIZATION N/A 02/01/2016   Procedure: Left Heart Cath and Cors/Grafts Angiography;  Surgeon: [name], MD;  Location: [facility];  Service: Cardiovascular;  Laterality: N/A;  . CARDIAC CATHETERIZATION N/A 02/01/2016   Procedure: Coronary Stent Intervention;  Surgeon: [name], MD;  Location: [facility];  Service: Cardiovascular;  Laterality: N/A;  . COLONOSCOPY  06/04/2002   Normal colon and rectum  . COLONOSCOPY  07/08/2012   Procedure: COLONOSCOPY;  Surgeon: [name], MD;  Location: [facility];  Service: Endoscopy;  Laterality: N/A;  11:30  . CORONARY ANGIOPLASTY WITH STENT PLACEMENT  02/01/2016  . CORONARY ARTERY BYPASS GRAFT  2003   LIMA to LAD,LIMA to distal RCA,SVG to ramus intermediate vessel.  . FOREIGN BODY REMOVAL Right 04/02/2014   Procedure: FOREIGN BODY REMOVAL RIGHT FOOT;  Surgeon: [name], MD;  Location: [facility];  Service: General;   Laterality: Right;  . JOINT REPLACEMENT    . JOINT REPLACEMENT    . LUMBAR DISC SURGERY    . REVISION TOTAL HIP ARTHROPLASTY Bilateral 2005-2012   right-left  . SHOULDER OPEN ROTATOR CUFF REPAIR Right   . TOTAL HIP ARTHROPLASTY Right 1999  . TOTAL HIP ARTHROPLASTY Left 2008  . 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    Prior to Admission medications   Medication Sig Start Date End Date Taking? Authorizing Provider  acetaminophen (TYLENOL) 500 MG tablet Take 1,000 mg by mouth every 6 (six) hours as needed for moderate pain or headache.   Yes [provider]  aspirin EC 81 MG tablet Take 1 tablet (81 mg total) by mouth daily. 01/26/16  Yes [name], MD  diltiazem (DILACOR XR) 240 MG 24 hr capsule Take 1 capsule (240 mg total) by mouth daily. KEEP OV. 06/14/16  Yes [name], MD  hydrochlorothiazide (MICROZIDE) 12.5 MG capsule Take 1 capsule (12.5 mg total) by mouth daily. 05/21/17 09/20/17 Yes [name], MD  HYDROcodone-acetaminophen (NORCO/VICODIN) 5-325 MG tablet Take 1 tablet by mouth every 6 (six) hours as needed for moderate pain. 12/28/16  Yes Carole Civil, MD  isosorbide mononitrate (IMDUR) 30 MG 24 hr tablet TAKE 1 TABLET BY MOUTH EVERY DAY Patient taking differently:  TAKE 30 MG BY MOUTH EVERY DAY 06/17/17  Yes [name], MD  metoprolol succinate (TOPROL-XL) 50 MG 24 hr tablet TAKE 1 & 1/2 TABLETS BY MOUTH EVERY DAY Patient taking differently: TAKE 75 MG BY MOUTH EVERY DAY 06/18/17  Yes [name], MD  Na Sulfate-K Sulfate-Mg Sulf 17.5-3.13-1.6 GM/177ML SOLN Take 1 kit by mouth as directed. 09/04/17  Yes Tamotsu Wiederholt, Cristopher Estimable, MD  nitroGLYCERIN (NITROSTAT) 0.4 MG SL tablet Place 1 tablet (0.4 mg total) under the tongue every 5 (five) minutes as needed for chest pain.  01/26/16  Yes [name], MD  ramipril (ALTACE) 2.5 MG capsule TAKE ONE CAPSULE BY MOUTH EVERY DAY Patient taking differently: TAKE 2.5 MG BY MOUTH EVERY DAY 07/11/17  Yes [name], MD  warfarin (COUMADIN) 7.5 MG tablet TAKE 1/2 TO 1 TABLET BY MOUTH DAILY AS DIRECTED Patient taking differently: Take 3.75-7.5 mg by mouth See admin instructions. Take 3.75 mg by mouth daily on Monday and Friday. Take 7.5 mg by mouth daily on all other days 08/28/17  Yes [name], MD    Allergies as of 09/04/2017 - Review Complete 08/20/2017  Allergen Reaction Noted  . Augmentin [amoxicillin-pot clavulanate] Nausea And Vomiting 01/14/2013    Family History  Problem Relation Age of Onset  . Heart attack Brother   . Colon cancer Neg Hx     Social History   Socioeconomic History  . Marital status: Married    Spouse name: Not on file  . Number of children: 2  . Years of education: Not on file  . Highest education level: Not on file  Social Needs  . Financial resource strain: Not on file  . Food insecurity - worry: Not on file  . Food insecurity - inability: Not on file  . Transportation needs - medical: Not on file  . Transportation needs - non-medical: Not on file  Occupational History  . 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 Smoking status: Former Smoker    Packs/day: 0.50    Years: 2.00    Pack years: 1.00    Types: Cigarettes    Last attempt to quit: 09/03/1966    Years since quitting: 51.1  . Smokeless tobacco: Never Used  Substance and Sexual Activity  . Alcohol use: Yes    Alcohol/week: 6.6 oz    Types: 7 Glasses of wine, 4 Cans of beer per week    Comment: a glass of wine with dinner/ beer on the weekend  . Drug use: No  . Sexual activity: Yes  Other Topics Concern  . Not on file  Social History Narrative  . Not on file    Review of Systems: See HPI, otherwise negative ROS  Physical Exam: BP 126/73   Pulse (!) 108    Temp 97.9 F (36.6 C) (Oral)   Ht '5\' 11"'$  (1.803 m)   Wt 223 lb (101.2 kg)   SpO2 96%   BMI 31.10 kg/m  General:   Alert,  Well-developed, well-nourished, pleasant and cooperative in NAD Neck:  Supple; no masses or thyromegaly. No significant cervical adenopathy. Lungs:  Clear throughout to auscultation.   No wheezes, crackles, or rhonchi. No acute distress. Heart:  Regular rate and rhythm; no murmurs, clicks, rubs,  or gallops. Abdomen: Non-distended, normal bowel sounds.  Soft and nontender without appreciable mass or hepatosplenomegaly.  Pulses:  Normal pulses noted. Extremities:  Without clubbing or edema.  Impression:  Pleasant 77 year old gentleman with the history of colonic polyps. Here for surveillance examination.  Coumadin held 4 days per cardiology.  Recommendations:  I have offered the patient a surveillance colonoscopy.  The risks, benefits, limitations, alternatives and imponderables have been reviewed with the patient. Questions have been answered. All parties are agreeable.    Notice: This dictation was prepared with Dragon dictation along with smaller phrase technology. Any transcriptional errors that result from this process are unintentional and may not be corrected upon review.

## 2017-10-02 NOTE — Discharge Instructions (Signed)
°  Colonoscopy Discharge Instructions  Read the instructions outlined below and refer to this sheet in the next few weeks. These discharge instructions provide you with general information on caring for yourself after you leave the hospital. Your doctor may also give you specific instructions. While your treatment has been planned according to the most current medical practices available, unavoidable complications occasionally occur. If you have any problems or questions after discharge, call Dr. Gala Romney at [phone]. ACTIVITY  You may resume your regular activity, but move at a slower pace for the next 24 hours.   Take frequent rest periods for the next 24 hours.   Walking will help get rid of the air and reduce the bloated feeling in your belly (abdomen).   No driving for 24 hours (because of the medicine (anesthesia) used during the test).    Do not sign any important legal documents or operate any machinery for 24 hours (because of the anesthesia used during the test).  NUTRITION  Drink plenty of fluids.   You may resume your normal diet as instructed by your doctor.   Begin with a light meal and progress to your normal diet. Heavy or fried foods are harder to digest and may make you feel sick to your stomach (nauseated).   Avoid alcoholic beverages for 24 hours or as instructed.  MEDICATIONS  You may resume your normal medications unless your doctor tells you otherwise.  WHAT YOU CAN EXPECT TODAY  Some feelings of bloating in the abdomen.   Passage of more gas than usual.   Spotting of blood in your stool or on the toilet paper.  IF YOU HAD POLYPS REMOVED DURING THE COLONOSCOPY:  No aspirin products for 7 days or as instructed.   No alcohol for 7 days or as instructed.   Eat a soft diet for the next 24 hours.  FINDING OUT THE RESULTS OF YOUR TEST Not all test results are available during your visit. If your test results are not back during the visit, make an appointment  with your caregiver to find out the results. Do not assume everything is normal if you have not heard from your caregiver or the medical facility. It is important for you to follow up on all of your test results.  SEEK IMMEDIATE MEDICAL ATTENTION IF:  You have more than a spotting of blood in your stool.   Your belly is swollen (abdominal distention).   You are nauseated or vomiting.   You have a temperature over 101.   You have abdominal pain or discomfort that is severe or gets worse throughout the day.    I do not recommend a future colonoscopy unless new symptoms develop  Resume Coumadin today

## 2017-10-04 ENCOUNTER — Encounter (HOSPITAL_COMMUNITY): Payer: Self-pay | Admitting: Internal Medicine

## 2017-10-15 ENCOUNTER — Ambulatory Visit (INDEPENDENT_AMBULATORY_CARE_PROVIDER_SITE_OTHER): Payer: Medicare HMO | Admitting: *Deleted

## 2017-10-15 DIAGNOSIS — Z111 Encounter for screening for respiratory tuberculosis: Secondary | ICD-10-CM | POA: Diagnosis not present

## 2017-10-17 LAB — TB SKIN TEST
Induration: 0 mm
TB SKIN TEST: NEGATIVE

## 2017-10-22 ENCOUNTER — Other Ambulatory Visit: Payer: Self-pay | Admitting: Cardiovascular Disease

## 2017-10-22 DIAGNOSIS — Z7901 Long term (current) use of anticoagulants: Secondary | ICD-10-CM | POA: Diagnosis not present

## 2017-10-22 LAB — COAGUCHEK XS/INR WAIVED
INR: 3.7 — ABNORMAL HIGH (ref 0.9–1.1)
PROTHROMBIN TIME: 44.2 s

## 2017-10-24 ENCOUNTER — Other Ambulatory Visit: Payer: Self-pay | Admitting: Cardiovascular Disease

## 2017-10-25 ENCOUNTER — Ambulatory Visit (INDEPENDENT_AMBULATORY_CARE_PROVIDER_SITE_OTHER): Payer: Medicare HMO | Admitting: Pharmacist Clinician (PhC)/ Clinical Pharmacy Specialist

## 2017-10-25 DIAGNOSIS — I482 Chronic atrial fibrillation, unspecified: Secondary | ICD-10-CM

## 2017-10-25 DIAGNOSIS — Z7901 Long term (current) use of anticoagulants: Secondary | ICD-10-CM

## 2017-11-11 ENCOUNTER — Other Ambulatory Visit: Payer: Self-pay | Admitting: *Deleted

## 2017-11-11 MED ORDER — HYDROCHLOROTHIAZIDE 12.5 MG PO CAPS: 12.5000 mg | ORAL_CAPSULE | Freq: Every day | ORAL | 1 refills | Status: DC

## 2017-11-11 NOTE — Telephone Encounter (Signed)
Rx has been sent to the pharmacy electronically. ° °

## 2017-11-22 ENCOUNTER — Other Ambulatory Visit: Payer: Self-pay | Admitting: Cardiovascular Disease

## 2017-12-02 ENCOUNTER — Other Ambulatory Visit: Payer: Self-pay | Admitting: Cardiovascular Disease

## 2017-12-02 DIAGNOSIS — Z7901 Long term (current) use of anticoagulants: Secondary | ICD-10-CM | POA: Diagnosis not present

## 2017-12-02 LAB — COAGUCHEK XS/INR WAIVED
INR: 2 — ABNORMAL HIGH (ref 0.9–1.1)
PROTHROMBIN TIME: 23.4 s

## 2017-12-04 ENCOUNTER — Ambulatory Visit (INDEPENDENT_AMBULATORY_CARE_PROVIDER_SITE_OTHER): Payer: Medicare HMO | Admitting: Pharmacist Clinician (PhC)/ Clinical Pharmacy Specialist

## 2017-12-04 ENCOUNTER — Other Ambulatory Visit: Payer: Self-pay | Admitting: Cardiovascular Disease

## 2017-12-04 DIAGNOSIS — Z7901 Long term (current) use of anticoagulants: Secondary | ICD-10-CM | POA: Diagnosis not present

## 2017-12-04 DIAGNOSIS — I482 Chronic atrial fibrillation, unspecified: Secondary | ICD-10-CM

## 2017-12-04 NOTE — Telephone Encounter (Signed)
REFILL 

## 2017-12-08 ENCOUNTER — Other Ambulatory Visit: Payer: Self-pay | Admitting: Cardiovascular Disease

## 2018-01-01 ENCOUNTER — Other Ambulatory Visit: Payer: Self-pay | Admitting: Cardiovascular Disease

## 2018-01-01 DIAGNOSIS — Z7901 Long term (current) use of anticoagulants: Secondary | ICD-10-CM | POA: Diagnosis not present

## 2018-01-01 LAB — COAGUCHEK XS/INR WAIVED
INR: 2.4 — ABNORMAL HIGH (ref 0.9–1.1)
Prothrombin Time: 29 s

## 2018-01-03 ENCOUNTER — Ambulatory Visit (INDEPENDENT_AMBULATORY_CARE_PROVIDER_SITE_OTHER): Payer: Medicare HMO | Admitting: Pharmacist Clinician (PhC)/ Clinical Pharmacy Specialist

## 2018-01-03 DIAGNOSIS — Z7901 Long term (current) use of anticoagulants: Secondary | ICD-10-CM | POA: Diagnosis not present

## 2018-01-03 DIAGNOSIS — I482 Chronic atrial fibrillation, unspecified: Secondary | ICD-10-CM

## 2018-01-13 DIAGNOSIS — R69 Illness, unspecified: Secondary | ICD-10-CM | POA: Diagnosis not present

## 2018-01-29 ENCOUNTER — Other Ambulatory Visit: Payer: Self-pay | Admitting: Cardiovascular Disease

## 2018-01-29 ENCOUNTER — Other Ambulatory Visit: Payer: Self-pay

## 2018-01-29 MED ORDER — NITROGLYCERIN 0.4 MG SL SUBL: SUBLINGUAL_TABLET | SUBLINGUAL | 0 refills | Status: DC

## 2018-01-29 NOTE — Telephone Encounter (Signed)
Rx sent to pharmacy   

## 2018-02-04 ENCOUNTER — Other Ambulatory Visit: Payer: Self-pay | Admitting: Cardiovascular Disease

## 2018-02-04 DIAGNOSIS — Z7901 Long term (current) use of anticoagulants: Secondary | ICD-10-CM | POA: Diagnosis not present

## 2018-02-04 LAB — COAGUCHEK XS/INR WAIVED
INR: 2.2 — AB (ref 0.9–1.1)
PROTHROMBIN TIME: 26.7 s

## 2018-02-05 ENCOUNTER — Ambulatory Visit (INDEPENDENT_AMBULATORY_CARE_PROVIDER_SITE_OTHER): Payer: Medicare HMO | Admitting: Pharmacist Clinician (PhC)/ Clinical Pharmacy Specialist

## 2018-02-05 DIAGNOSIS — I482 Chronic atrial fibrillation, unspecified: Secondary | ICD-10-CM

## 2018-02-05 DIAGNOSIS — Z7901 Long term (current) use of anticoagulants: Secondary | ICD-10-CM | POA: Diagnosis not present

## 2018-02-10 ENCOUNTER — Other Ambulatory Visit: Payer: Self-pay | Admitting: Cardiovascular Disease

## 2018-03-04 ENCOUNTER — Other Ambulatory Visit: Payer: Self-pay | Admitting: Cardiovascular Disease

## 2018-03-04 DIAGNOSIS — Z7901 Long term (current) use of anticoagulants: Secondary | ICD-10-CM | POA: Diagnosis not present

## 2018-03-04 LAB — COAGUCHEK XS/INR WAIVED
INR: 1.4 — AB (ref 0.9–1.1)
Prothrombin Time: 17.1 s

## 2018-03-05 ENCOUNTER — Ambulatory Visit (INDEPENDENT_AMBULATORY_CARE_PROVIDER_SITE_OTHER): Payer: Medicare HMO | Admitting: Family Medicine

## 2018-03-05 VITALS — BP 110/68 | Ht 71.0 in | Wt 214.0 lb

## 2018-03-05 DIAGNOSIS — M5430 Sciatica, unspecified side: Secondary | ICD-10-CM | POA: Diagnosis not present

## 2018-03-05 DIAGNOSIS — M48062 Spinal stenosis, lumbar region with neurogenic claudication: Secondary | ICD-10-CM

## 2018-03-05 NOTE — Progress Notes (Signed)
   Subjective:    Patient ID: Leonard Cisneros, male    DOB: Jan 30, 1941, 77 y.o.   MRN: [id number]  Back Pain  This is a chronic problem. The current episode started more than 1 year ago. The pain is present in the lumbar spine. Exacerbated by: walking, twisting, bending. Treatments tried: tylenol, exercises.   Back progressively worsening  Gets to hurting when standing for few minutes  Even walking develops bad deep ache  Pos hx of back surgery times two in the past, told in the past if needed anything further would go to fusion  Taking tylenol not helping at all  Was taking     Patient notes progressive severe pain low back.  Has had low back pain for a long time but this has been progressing in recent months.  Patient unable to stand or walk for more than just a few minutes.  Then develops a deep ache in the low back and hips.  This radiates into the thighs.  Patient is forced to actually sit down before he can continue.  Prior history of back surgery x2 reviewed with the patient.  Intermittent spells of sciatica off and on since his surgery long ago.      Review of Systems  Musculoskeletal: Positive for back pain.  No headache, no major weight loss or weight gain, no chest pain  abdominal pain no change in bowel habits complete ROS otherwise negative .     Objective:   Physical Exam  Alert and oriented, vitals reviewed and stable, NAD ENT-TM's and ext canals WNL bilat via otoscopic exam Soft palate, tonsils and post pharynx WNL via oropharyngeal exam Neck-symmetric, no masses; thyroid nonpalpable and nontender Pulmonary-no tachypnea or accessory muscle use; Clear without wheezes via auscultation Card--no abnrml murmurs, rhythm reg and rate WNL Carotid pulses symmetric, without bruits Positive straight leg raise bilateral.  Positive sciatic notch tenderness bilateral positive presacral pain with percussion.  mild hyperreflexia patellar bilateral      Assessment &  Plan:  Impression probable spinal stenosis.  Concerning.  Progressive in nature.  Patient needs an MRI.  With history of lumbar disc surgery x2 in the past.  Spinal stenosis highest on the list warning signs discussed at length.  Numerous questions answered.  Further recommendations based on MRI result  Greater than 50% of this 25 minute face to face visit was spent in counseling and discussion and coordination of care regarding the above diagnosis/diagnosies

## 2018-03-10 ENCOUNTER — Ambulatory Visit (INDEPENDENT_AMBULATORY_CARE_PROVIDER_SITE_OTHER): Payer: Medicare HMO | Admitting: Pharmacist Clinician (PhC)/ Clinical Pharmacy Specialist

## 2018-03-10 DIAGNOSIS — Z7901 Long term (current) use of anticoagulants: Secondary | ICD-10-CM

## 2018-03-10 DIAGNOSIS — I482 Chronic atrial fibrillation, unspecified: Secondary | ICD-10-CM

## 2018-03-20 ENCOUNTER — Telehealth: Payer: Self-pay | Admitting: Family Medicine

## 2018-03-20 NOTE — Telephone Encounter (Signed)
MRI was DENIED - please see denial information in green folder in yellow box  Please advise

## 2018-03-20 NOTE — Telephone Encounter (Signed)
Tried calling busy

## 2018-03-20 NOTE — Telephone Encounter (Signed)
Inform pt his insur co is insisting on regular low back xrays and  conservative therapy first. If he want to come in and talk about that first he can or we can order lumbosacral xrays  And an adult pred taper to use for inflammation, and  Then re ck mid next wk

## 2018-03-21 NOTE — Telephone Encounter (Signed)
Tried calling again today the phone busy.

## 2018-03-21 NOTE — Telephone Encounter (Signed)
I called spouse Jeani Hawking # left number to r/c.

## 2018-04-01 ENCOUNTER — Other Ambulatory Visit: Payer: Self-pay | Admitting: Family Medicine

## 2018-04-01 MED ORDER — PREDNISONE 20 MG PO TABS: ORAL_TABLET | ORAL | 0 refills | Status: DC

## 2018-04-01 NOTE — Telephone Encounter (Signed)
Med sent in and pt is setting up appt with Dr.Steve to discuss lower back

## 2018-04-07 ENCOUNTER — Ambulatory Visit (INDEPENDENT_AMBULATORY_CARE_PROVIDER_SITE_OTHER): Payer: Medicare HMO | Admitting: Family Medicine

## 2018-04-07 ENCOUNTER — Encounter: Payer: Self-pay | Admitting: Family Medicine

## 2018-04-07 ENCOUNTER — Ambulatory Visit (HOSPITAL_COMMUNITY)
Admission: RE | Admit: 2018-04-07 | Discharge: 2018-04-07 | Disposition: A | Discharge status: Home / Self Care | Payer: Medicare HMO | Source: Ambulatory Visit | Attending: Family Medicine | Admitting: Family Medicine

## 2018-04-07 VITALS — BP 142/72 | Ht 71.0 in | Wt 226.0 lb

## 2018-04-07 DIAGNOSIS — M5430 Sciatica, unspecified side: Secondary | ICD-10-CM | POA: Insufficient documentation

## 2018-04-07 DIAGNOSIS — M545 Low back pain: Secondary | ICD-10-CM | POA: Diagnosis not present

## 2018-04-07 DIAGNOSIS — I700 Atherosclerosis of aorta: Secondary | ICD-10-CM | POA: Insufficient documentation

## 2018-04-07 DIAGNOSIS — M48062 Spinal stenosis, lumbar region with neurogenic claudication: Secondary | ICD-10-CM

## 2018-04-07 NOTE — Progress Notes (Signed)
   Subjective:    Patient ID: Leonard Cisneros, male    DOB: [birth year], 77 y.o.   MRN: [id number]  HPI  Patient is here today to discuss lower back pain. Says he has discussed with you in the past. He states the Mri was denied and he is here today to see what steps he needs to take.  Patient continues to experience substantial pain.  See prior note for description.  This is still identical.  Severe pain that settles into the low back radiates into both thighs.  Occurs just a few minutes after standing.  Patient has to sit down more regularly history of 2 prior spinal surgeries.  This pain reminds patient of the same type discomfort.  Discomfort  We ordered an MRI because of the high likelihood of neurogenic claudication and spinal stenosis, however patient's insurance company denied it last month.  We went ahead and did a round of steroids.  Patient has been trying to do some stretching exercises.  Pain has not improved at all  Review of Systems No headache, no major weight loss or weight gain, no chest pain  abdominal pain no change in bowel habits complete ROS otherwise negative     Objective:   Physical Exam  Alert and oriented, vitals reviewed and stable, NAD ENT-TM's and ext canals WNL bilat via otoscopic exam Soft palate, tonsils and post pharynx WNL via oropharyngeal exam Neck-symmetric, no masses; thyroid nonpalpable and nontender Pulmonary-no tachypnea or accessory muscle use; Clear without wheezes via auscultation Card--no abnrml murmurs, rhythm reg and rate WNL Carotid pulses symmetric, without bruits Feet pulses intact symmetric positive bilateral straight leg raise.  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  Important to press on for further intervention.  Unfortunately dealing with insurance company denying appropriate testing.  We  will do the plain x-ray films as they are to name.  This likely will be unhelpful.  And will order MRI.  This likely injected.  Then we will get insurance but needs to do the right pain for this patient  Greater than 50% of this 25 minute face to face visit was spent in counseling and discussion and coordination of care regarding the above diagnosis/diagnosies

## 2018-04-08 ENCOUNTER — Other Ambulatory Visit: Payer: Self-pay | Admitting: *Deleted

## 2018-04-08 DIAGNOSIS — G8911 Acute pain due to trauma: Secondary | ICD-10-CM

## 2018-04-15 ENCOUNTER — Other Ambulatory Visit: Payer: Self-pay | Admitting: Cardiovascular Disease

## 2018-04-15 DIAGNOSIS — Z7901 Long term (current) use of anticoagulants: Secondary | ICD-10-CM | POA: Diagnosis not present

## 2018-04-15 LAB — COAGUCHEK XS/INR WAIVED
INR: 2.7 — ABNORMAL HIGH (ref 0.9–1.1)
PROTHROMBIN TIME: 32.6 s

## 2018-04-18 ENCOUNTER — Ambulatory Visit (INDEPENDENT_AMBULATORY_CARE_PROVIDER_SITE_OTHER): Payer: Medicare HMO | Admitting: Cardiology

## 2018-04-18 DIAGNOSIS — I482 Chronic atrial fibrillation, unspecified: Secondary | ICD-10-CM

## 2018-04-18 DIAGNOSIS — Z7901 Long term (current) use of anticoagulants: Secondary | ICD-10-CM | POA: Diagnosis not present

## 2018-04-20 ENCOUNTER — Encounter (HOSPITAL_COMMUNITY): Payer: Self-pay | Admitting: Emergency Medicine

## 2018-04-20 ENCOUNTER — Emergency Department (HOSPITAL_COMMUNITY): Payer: Medicare HMO

## 2018-04-20 ENCOUNTER — Inpatient Hospital Stay (HOSPITAL_COMMUNITY)
Admission: EM | Admit: 2018-04-20 | Discharge: 2018-04-28 | DRG: 536 | Disposition: A | Discharge status: Home Health | Payer: Medicare HMO | Attending: Family Medicine | Admitting: Family Medicine

## 2018-04-20 ENCOUNTER — Other Ambulatory Visit (HOSPITAL_COMMUNITY): Payer: Self-pay

## 2018-04-20 ENCOUNTER — Other Ambulatory Visit: Payer: Self-pay

## 2018-04-20 DIAGNOSIS — I739 Peripheral vascular disease, unspecified: Secondary | ICD-10-CM | POA: Diagnosis present

## 2018-04-20 DIAGNOSIS — E669 Obesity, unspecified: Secondary | ICD-10-CM | POA: Diagnosis present

## 2018-04-20 DIAGNOSIS — I10 Essential (primary) hypertension: Secondary | ICD-10-CM | POA: Diagnosis not present

## 2018-04-20 DIAGNOSIS — M47816 Spondylosis without myelopathy or radiculopathy, lumbar region: Secondary | ICD-10-CM | POA: Diagnosis present

## 2018-04-20 DIAGNOSIS — S8991XA Unspecified injury of right lower leg, initial encounter: Secondary | ICD-10-CM | POA: Diagnosis not present

## 2018-04-20 DIAGNOSIS — Z951 Presence of aortocoronary bypass graft: Secondary | ICD-10-CM

## 2018-04-20 DIAGNOSIS — I25810 Atherosclerosis of coronary artery bypass graft(s) without angina pectoris: Secondary | ICD-10-CM | POA: Diagnosis present

## 2018-04-20 DIAGNOSIS — W010XXA Fall on same level from slipping, tripping and stumbling without subsequent striking against object, initial encounter: Secondary | ICD-10-CM | POA: Diagnosis present

## 2018-04-20 DIAGNOSIS — R5381 Other malaise: Secondary | ICD-10-CM | POA: Diagnosis present

## 2018-04-20 DIAGNOSIS — I482 Chronic atrial fibrillation, unspecified: Secondary | ICD-10-CM | POA: Diagnosis present

## 2018-04-20 DIAGNOSIS — Z96643 Presence of artificial hip joint, bilateral: Secondary | ICD-10-CM | POA: Diagnosis present

## 2018-04-20 DIAGNOSIS — S79911A Unspecified injury of right hip, initial encounter: Secondary | ICD-10-CM | POA: Diagnosis not present

## 2018-04-20 DIAGNOSIS — S72111D Displaced fracture of greater trochanter of right femur, subsequent encounter for closed fracture with routine healing: Secondary | ICD-10-CM

## 2018-04-20 DIAGNOSIS — M549 Dorsalgia, unspecified: Secondary | ICD-10-CM | POA: Diagnosis not present

## 2018-04-20 DIAGNOSIS — W19XXXA Unspecified fall, initial encounter: Secondary | ICD-10-CM | POA: Diagnosis not present

## 2018-04-20 DIAGNOSIS — Z9861 Coronary angioplasty status: Secondary | ICD-10-CM

## 2018-04-20 DIAGNOSIS — E785 Hyperlipidemia, unspecified: Secondary | ICD-10-CM | POA: Diagnosis present

## 2018-04-20 DIAGNOSIS — Z87891 Personal history of nicotine dependence: Secondary | ICD-10-CM

## 2018-04-20 DIAGNOSIS — M792 Neuralgia and neuritis, unspecified: Secondary | ICD-10-CM | POA: Diagnosis present

## 2018-04-20 DIAGNOSIS — S72111A Displaced fracture of greater trochanter of right femur, initial encounter for closed fracture: Secondary | ICD-10-CM | POA: Diagnosis not present

## 2018-04-20 DIAGNOSIS — Z955 Presence of coronary angioplasty implant and graft: Secondary | ICD-10-CM

## 2018-04-20 DIAGNOSIS — Z7901 Long term (current) use of anticoagulants: Secondary | ICD-10-CM

## 2018-04-20 DIAGNOSIS — I255 Ischemic cardiomyopathy: Secondary | ICD-10-CM | POA: Diagnosis present

## 2018-04-20 DIAGNOSIS — I110 Hypertensive heart disease with heart failure: Secondary | ICD-10-CM | POA: Diagnosis present

## 2018-04-20 DIAGNOSIS — Z880 Allergy status to penicillin: Secondary | ICD-10-CM

## 2018-04-20 DIAGNOSIS — G8929 Other chronic pain: Secondary | ICD-10-CM | POA: Diagnosis present

## 2018-04-20 DIAGNOSIS — M25551 Pain in right hip: Secondary | ICD-10-CM

## 2018-04-20 DIAGNOSIS — M79604 Pain in right leg: Secondary | ICD-10-CM | POA: Diagnosis not present

## 2018-04-20 DIAGNOSIS — S72113A Displaced fracture of greater trochanter of unspecified femur, initial encounter for closed fracture: Secondary | ICD-10-CM

## 2018-04-20 DIAGNOSIS — G629 Polyneuropathy, unspecified: Secondary | ICD-10-CM | POA: Diagnosis present

## 2018-04-20 DIAGNOSIS — Z7982 Long term (current) use of aspirin: Secondary | ICD-10-CM

## 2018-04-20 DIAGNOSIS — S72114A Nondisplaced fracture of greater trochanter of right femur, initial encounter for closed fracture: Secondary | ICD-10-CM | POA: Diagnosis not present

## 2018-04-20 DIAGNOSIS — S79921A Unspecified injury of right thigh, initial encounter: Secondary | ICD-10-CM | POA: Diagnosis not present

## 2018-04-20 DIAGNOSIS — Z6832 Body mass index (BMI) 32.0-32.9, adult: Secondary | ICD-10-CM

## 2018-04-20 DIAGNOSIS — I2510 Atherosclerotic heart disease of native coronary artery without angina pectoris: Secondary | ICD-10-CM | POA: Diagnosis present

## 2018-04-20 DIAGNOSIS — S3992XA Unspecified injury of lower back, initial encounter: Secondary | ICD-10-CM | POA: Diagnosis not present

## 2018-04-20 DIAGNOSIS — Z8249 Family history of ischemic heart disease and other diseases of the circulatory system: Secondary | ICD-10-CM

## 2018-04-20 DIAGNOSIS — D68318 Other hemorrhagic disorder due to intrinsic circulating anticoagulants, antibodies, or inhibitors: Secondary | ICD-10-CM | POA: Diagnosis not present

## 2018-04-20 DIAGNOSIS — I25700 Atherosclerosis of coronary artery bypass graft(s), unspecified, with unstable angina pectoris: Secondary | ICD-10-CM | POA: Diagnosis not present

## 2018-04-20 DIAGNOSIS — M79606 Pain in leg, unspecified: Secondary | ICD-10-CM | POA: Diagnosis present

## 2018-04-20 DIAGNOSIS — N390 Urinary tract infection, site not specified: Secondary | ICD-10-CM | POA: Diagnosis not present

## 2018-04-20 DIAGNOSIS — S0990XA Unspecified injury of head, initial encounter: Secondary | ICD-10-CM | POA: Diagnosis not present

## 2018-04-20 DIAGNOSIS — R2681 Unsteadiness on feet: Secondary | ICD-10-CM | POA: Diagnosis present

## 2018-04-20 DIAGNOSIS — I5032 Chronic diastolic (congestive) heart failure: Secondary | ICD-10-CM | POA: Diagnosis present

## 2018-04-20 MED ORDER — HYDROMORPHONE HCL 1 MG/ML IJ SOLN
0.5000 mg | Freq: Once | INTRAMUSCULAR | Status: AC
  Administered 2018-04-21: 0.5 mg via INTRAVENOUS
  Filled 2018-04-20: qty 1

## 2018-04-20 NOTE — ED Triage Notes (Signed)
Pt states fell around 1200 today on asphalt, denies LOC or hitting head, pain to right hip, hx of bilateral hip replacements

## 2018-04-20 NOTE — ED Provider Notes (Signed)
[facility] EMERGENCY DEPARTMENT Provider Note   CSN: [id number] Arrival date & time: 04/20/18  2037     History   Chief Complaint Chief Complaint  Patient presents with  . Fall    HPI Leonard Cisneros is a 77 y.o. male.  Leonard Cisneros is a 77 y.o. Male story of  CAD, CHF, chronic A. Fib on Coumadin, hypertension, hyperlipidemia, PAD and peripheral neuropathy, presents to the emergency department for evaluation of right hip pain after a fall.  Patient reports around noon on 8/18 he tripped [place] and fell on asphalt landing directly on his right hip.  Patient reports he had a slight scrape to the right knee, but did not hit his back, neck or head.  No LOC, no associated headache, nausea, vomiting, vision changes or dizziness.  A bystander helped him up and he was initially able to walk around his house a little bit but over the course of the day pain has become increasingly worse particularly with range of motion or weightbearing.  Pain primarily over the lateral and anterior hip.  Patient has not looked to see if there is any bruising.  He has some chronic neuropathy in the leg but denies new numbness or weakness.  Patient reports when he tries to move the knee this causes worsening pain in the hip.  History of bilateral hip replacements with bilateral revisions.  Surgery done by Dr.Alusio.  Took some Tylenol without relief, has not tried anything else for pain prior to arrival.  Denies any chest pain or shortness of breath, no abdominal pain.  Patient has chronic low back pain which she is scheduled for an upcoming MRI for reports this is not acutely worsened after fall.     Past Medical History:  Diagnosis Date  . Atrial flutter ([place])    a. s/p remote ablation by Dr. [name].  [name] CAD (coronary artery disease)    a. s/p CABG 2003. b. 01/2016: unstable angina - s/p BMS to SVG-ramus intermediate, patent LIMA-mLAD, patent RIMA-dRCA.   [name] CHF (congestive heart  failure) ([place])   . Chronic atrial fibrillation (HCC)    a. initially post op CABG then chronic. On Coumadin.  . Chronic lower back pain   . Essential hypertension   . High cholesterol   . Ischemic cardiomyopathy    a. Cath 01/2016 -  EF 50-55% with mild distal inferior hypocontractility.  [name] Unspecified hereditary and idiopathic peripheral neuropathy 04/04/2014    Patient Active Problem List   Diagnosis Date Noted  . Hx of adenomatous colonic polyps 08/20/2017  . Cardiomyopathy, ischemic 02/02/2016  . Obesity 02/02/2016  . CAD (coronary artery disease) of artery bypass graft 02/01/2016  . Coronary artery disease involving coronary bypass graft of native heart with unstable angina pectoris ([place])   . Exertional angina ([place]) 01/28/2016  . Hyperlipidemia LDL goal <70 01/28/2016  . Neuropathic pain 06/13/2015  . Mild obesity 04/20/2015  . Essential hypertension 04/20/2015  . Unspecified hereditary and idiopathic peripheral neuropathy 04/04/2014  . Foreign body (FB) in soft tissue 03/31/2014  . Impaired glucose metabolism 03/31/2014  . Morbidly obese ([place]) 03/31/2014  . CAD (coronary artery disease) 01/04/2014  . Rotator cuff tear, left 10/22/2013  . Rotator cuff syndrome of left shoulder 10/22/2013  . Chronic atrial fibrillation ([place]) 06/18/2013  . Long term (current) use of anticoagulants 06/18/2013  . Screening for colon cancer 06/18/2012  . High risk medication use 06/18/2012    Past Surgical  History:  Procedure Laterality Date  . BACK SURGERY    . CARDIAC CATHETERIZATION  ~ 2000; 2003  . CARDIAC CATHETERIZATION N/A 02/01/2016   Procedure: Left Heart Cath and Cors/Grafts Angiography;  Surgeon: [name], MD;  Location: [place] [facility];  Service: Cardiovascular;  Laterality: N/A;  . CARDIAC CATHETERIZATION N/A 02/01/2016   Procedure: Coronary Stent Intervention;  Surgeon: [name], MD;  Location: [place] [facility];  Service: Cardiovascular;  Laterality: N/A;  .  COLONOSCOPY  06/04/2002   Normal colon and rectum  . COLONOSCOPY  07/08/2012   Procedure: COLONOSCOPY;  Surgeon: [name], MD;  [facility];  Service: Endoscopy;  Laterality: N/A;  11:30  . COLONOSCOPY N/A 10/02/2017   Procedure: COLONOSCOPY;  Surgeon: [name], MD;  Location: [facility];  Service: Endoscopy;  Laterality: N/A;  10:30am  . CORONARY ANGIOPLASTY WITH STENT PLACEMENT  02/01/2016  . CORONARY ARTERY BYPASS GRAFT  2003   LIMA to LAD,LIMA to distal RCA,SVG to ramus intermediate vessel.  . FOREIGN BODY REMOVAL Right 04/02/2014   Procedure: FOREIGN BODY REMOVAL RIGHT FOOT;  Surgeon: [name], MD;  Location: [facility];  Service: General;  Laterality: Right;  . JOINT REPLACEMENT    . JOINT REPLACEMENT    . LUMBAR DISC SURGERY    . REVISION TOTAL HIP ARTHROPLASTY Bilateral 2005-2012   right-left  . SHOULDER OPEN ROTATOR CUFF REPAIR Right   . TOTAL HIP ARTHROPLASTY Right 1999  . TOTAL HIP ARTHROPLASTY Left 2008  . 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        Home Medications    Prior to Admission medications   Medication Sig Start Date End Date Taking? Authorizing Provider  acetaminophen (TYLENOL) 500 MG tablet Take 1,000 mg by mouth every 6 (six) hours as needed for moderate pain or headache.    [provider]  aspirin EC 81 MG tablet Take 1 tablet (81 mg total) by mouth daily. 01/26/16   [name], MD  diltiazem (DILACOR XR) 240 MG 24 hr capsule Take 1 capsule (240 mg total) by mouth daily. KEEP OV. 06/14/16   [name], MD  hydrochlorothiazide (MICROZIDE) 12.5 MG capsule Take 1 capsule (12.5 mg total) by mouth daily. 11/11/17 02/09/18  [name], MD  HYDROcodone-acetaminophen (NORCO/VICODIN) 5-325 MG tablet Take 1 tablet by mouth every 6  (six) hours as needed for moderate pain. 12/28/16   Carole Civil, MD  isosorbide mononitrate (IMDUR) 30 MG 24 hr tablet TAKE 1 TABLET BY MOUTH EVERY DAY 12/04/17   [name], MD  metoprolol succinate (TOPROL-XL) 50 MG 24 hr tablet TAKE 1 & 1/2 TABLETS BY MOUTH EVERY DAY 12/09/17   [name], MD  Na Sulfate-K Sulfate-Mg Sulf 17.5-3.13-1.6 GM/177ML SOLN Take 1 kit by mouth as directed. 09/04/17   Rourk, Cristopher Estimable, MD  nitroGLYCERIN (NITROSTAT) 0.4 MG SL tablet DISSOLVE 1 TABLET UNDER THE TONGUE EVERY 5 MINUTES UP TO 15 MINUTES FOR CHEST PAIN 01/29/18   [name], MD  predniSONE (DELTASONE) 20 MG tablet Take 3 tablets for 3 days, then take 2 tablets for 3 days, then take one tablet for 3 days. 04/01/18   [name], MD  ramipril (ALTACE) 2.5 MG capsule TAKE ONE CAPSULE BY MOUTH EVERY DAY Patient taking  differently: TAKE 2.5 MG BY MOUTH EVERY DAY 07/11/17   [name], MD  warfarin (COUMADIN) 7.5 MG tablet TAKE 1/2 TO 1 TABLET BY MOUTH DAILY AS DIRECTED Patient taking differently: Take 3.75-7.5 mg by mouth See admin instructions. Take 3.75 mg by mouth daily on Monday and Friday. Take 7.5 mg by mouth daily on all other days 08/28/17   [name], MD  warfarin (COUMADIN) 7.5 MG tablet Take 1/2 to 1 tablet by mouth daily as directed. 02/10/18   [name], MD    Family History Family History  Problem Relation Age of Onset  . Heart attack Brother   . Colon cancer Neg Hx     Social History Social History   Tobacco Use  . Smoking status: Former Smoker    Packs/day: 0.50    Years: 2.00    Pack years: 1.00    Types: Cigarettes    Last attempt to quit: 09/03/1966    Years since quitting: 51.6  . Smokeless tobacco: Never Used  Substance Use Topics  . Alcohol use: Yes    Alcohol/week: 11.0 standard drinks    Types: 7 Glasses of wine, 4 Cans of beer per week    Comment: a glass of wine with dinner/ beer on the weekend  . Drug use: No     Allergies     Augmentin [amoxicillin-pot clavulanate]   Review of Systems Review of Systems  Constitutional: Negative for chills and fever.  HENT: Negative.   Eyes: Negative for visual disturbance.  Respiratory: Negative for cough and shortness of breath.   Cardiovascular: Negative for chest pain.  Gastrointestinal: Negative for abdominal pain, nausea and vomiting.  Genitourinary: Negative for flank pain.  Musculoskeletal: Positive for arthralgias, gait problem, joint swelling and myalgias. Negative for back pain and neck pain.  Skin: Negative for color change, rash and wound.  Neurological: Negative for weakness, numbness and headaches.     Physical Exam Updated Vital Signs BP (!) 133/107 (BP Location: Left Arm)   Pulse 81   Temp 98.3 F (36.8 C) (Oral)   Resp 18   Ht '5\' 11"'$  (1.803 m)   Wt 104.3 kg   SpO2 97%   BMI 32.08 kg/m   Physical Exam  Constitutional: He is oriented to person, place, and time. He appears well-developed and well-nourished. No distress.  HENT:  Head: Normocephalic and atraumatic.  Mouth/Throat: Oropharynx is clear and moist.  Scalp without signs of trauma, no palpable hematoma, no step-off, negative battle sign  Eyes: Pupils are equal, round, and reactive to light. EOM are normal. Right eye exhibits no discharge. Left eye exhibits no discharge.  Neck: Normal range of motion. Neck supple.  No midline c-spine tenderness, normal ROM  Cardiovascular: Normal rate and normal heart sounds.  Pulses:      Dorsalis pedis pulses are Detected w/ doppler on the right side.       Posterior tibial pulses are Detected w/ doppler on the right side.  Rhythm irregularly irregular  Pulmonary/Chest: Effort normal and breath sounds normal. No respiratory distress.  Respirations equal and unlabored, patient able to speak in full sentences, lungs clear to auscultation bilaterally  Abdominal: Soft. Bowel sounds are normal. He exhibits no distension and no mass. There is no  tenderness. There is no guarding.  Abdomen soft, nondistended, nontender to palpation in all quadrants without guarding or peritoneal signs  Musculoskeletal:       Legs: Tenderness to palpation over the anterior and lateral aspects  of the right hip, prior surgical scars noted with small amount of ecchymosis present along the lateral hip.  In comparison to left, compartment seems more firm, but is not hard to the point of compartment syndrome.  2+ femoral and DP pulse, 1+ PT pulse, pulses confirmed with Doppler.  Hip is not held in internal or external rotation, no obvious shortening.  Significant pain with range of motion of the hip or knee.  No pain with range of motion of the ankle, no calf tenderness.  Small superficial abrasion just below the patella.   Neurological: He is alert and oriented to person, place, and time. Coordination normal.  Speech is clear, able to follow commands CN III-XII intact Normal strength in upper and lower extremities bilaterally including dorsiflexion and plantar flexion, strong and equal grip strength Sensation normal to light and sharp touch Moves extremities without ataxia, coordination intact  Skin: Skin is warm and dry. Capillary refill takes less than 2 seconds. He is not diaphoretic.  Psychiatric: He has a normal mood and affect. His behavior is normal.  Nursing note and vitals reviewed.    ED Treatments / Results  Labs (all labs ordered are listed, but only abnormal results are displayed) Labs Reviewed  CBC - Abnormal; Notable for the following components:      Result Value   WBC 11.0 (*)    RBC 3.90 (*)    MCV 102.3 (*)    MCH 35.1 (*)    All other components within normal limits  BASIC METABOLIC PANEL - Abnormal; Notable for the following components:   Glucose, Bld 128 (*)    All other components within normal limits  PROTIME-INR - Abnormal; Notable for the following components:   Prothrombin Time 24.9 (*)    All other components within  normal limits  TYPE AND SCREEN    EKG None  Radiology Ct Head Wo Contrast  Result Date: 04/21/2018 CLINICAL DATA:  Patient fell today on asphalt. No loss of consciousness. EXAM: CT HEAD WITHOUT CONTRAST TECHNIQUE: Contiguous axial images were obtained from the base of the skull through the vertex without intravenous contrast. COMPARISON:  None. FINDINGS: Brain: No evidence of acute infarction, hemorrhage, hydrocephalus, extra-axial collection or mass lesion/mass effect. Diffuse cerebral atrophy. Vascular: Moderate intracranial arterial vascular calcifications are present. Skull: Calvarium appears intact. No acute depressed skull fractures. Sinuses/Orbits: Mucosal thickening of the paranasal sinuses with opacification of multiple ethmoid air cells. No acute air-fluid levels. Mastoid air cells are clear. Other: None. IMPRESSION: No acute intracranial abnormalities. Chronic atrophy. Probable inflammatory changes in the paranasal sinuses. Electronically Signed   By: Lucienne Capers M.D.   On: 04/21/2018 00:44   Dg Knee Complete 4 Views Right  Result Date: 04/21/2018 CLINICAL DATA:  Fall EXAM: RIGHT KNEE - COMPLETE 4+ VIEW COMPARISON:  10/22/2016 FINDINGS: No fracture or malalignment. Mild patellofemoral and medial joint space degenerative change. Mild degenerative change of the lateral compartment. No significant the fusion. Vascular calcification. IMPRESSION: Degenerative changes without acute osseous abnormality. Electronically Signed   By: Donavan Foil M.D.   On: 04/21/2018 00:36   Dg Hip Unilat W Or Wo Pelvis 2-3 Views Right  Result Date: 04/20/2018 CLINICAL DATA:  Fall with history of hip replacements EXAM: DG HIP (WITH OR WITHOUT PELVIS) 2-3V RIGHT COMPARISON:  None. FINDINGS: Pubic symphysis and rami are intact. Status post left hip replacement with normal alignment. Status post right hip replacement without fracture or malalignment. IMPRESSION: Status post bilateral hip replacements without  acute osseous  abnormality Electronically Signed   By: Donavan Foil M.D.   On: 04/20/2018 22:23   Dg Femur 1 View Right  Result Date: 04/21/2018 CLINICAL DATA:  Fall with leg pain EXAM: RIGHT FEMUR 1 VIEW COMPARISON:  None. FINDINGS: Status post right hip replacement with normal alignment. No fracture or dislocation. Vascular calcification IMPRESSION: No acute osseous abnormality Electronically Signed   By: Donavan Foil M.D.   On: 04/21/2018 00:37    Procedures Procedures (including critical care time)  Medications Ordered in ED Medications  HYDROmorphone (DILAUDID) injection 0.5 mg (0.5 mg Intravenous Given 04/21/18 0009)     Initial Impression / Assessment and Plan / ED Course  I have reviewed the triage vital signs and the nursing notes.  Pertinent labs & imaging results that were available during my care of the patient were reviewed by me and considered in my medical decision making (see chart for details).  Patient presents for evaluation of right hip pain after a fall around noon today.  Initially with mild pain which has become persistently worse throughout the day.  Pain worse with range of motion and weightbearing.  Denies hitting his head.  Patient is on Coumadin for chronic A. Fib.  On arrival patient with normal vitals, appears uncomfortable.  Tenderness to palpation along the lateral and anterior right hip, pain significantly worse with range of motion.  In comparison to the left right lateral hip compartment seems more firm with small amount of ecchymosis concerning for hematoma.  All patient does have increasing firmness, does not seem to have reached compartment syndrome at this time.  Femoral pulses intact, and distal pulses confirmed with Doppler.  Neurovascularly intact.  Initial x-ray done from triage shows no evidence of hip fracture and intact hardware from prior replacements.  Exam very much concerning for hematoma versus occult fracture.  Will get x-rays of the right  femur and knee, and CT of the right hip and head given patient's anticoagulated state.  We will get basic labs, INR and type and screen.  Dilaudid for pain.  X-rays of the femur and knee show some degenerative and postsurgical changes but no acute fracture or abnormality.  CT head unremarkable.  CT hip pending.  Normal hemoglobin, minimal leukocytosis of 11.  No acute electrolyte derangements.  INR is therapeutic at 2.28.  At shift change care signed out to Dr. Wyvonnia Dusky who saw and evaluated the patient as well.  At time of signout CT of the hip pending.  Feel patient will likely require admission for observation and serial exams of the hip, and continued pain control.  Dr. Wyvonnia Dusky will follow up on CT and disposition appropriately.   Final Clinical Impressions(s) / ED Diagnoses   Final diagnoses:  Fall, initial encounter  Right hip pain  Anticoagulated    ED Discharge Orders    None       Janet Berlin 04/21/18 0120    Ezequiel Essex, MD 04/21/18 [phone]

## 2018-04-21 ENCOUNTER — Encounter (HOSPITAL_COMMUNITY): Payer: Self-pay

## 2018-04-21 ENCOUNTER — Other Ambulatory Visit: Payer: Self-pay

## 2018-04-21 ENCOUNTER — Other Ambulatory Visit (HOSPITAL_COMMUNITY): Payer: Self-pay

## 2018-04-21 ENCOUNTER — Emergency Department (HOSPITAL_COMMUNITY): Payer: Medicare HMO

## 2018-04-21 DIAGNOSIS — Z7901 Long term (current) use of anticoagulants: Secondary | ICD-10-CM

## 2018-04-21 DIAGNOSIS — I482 Chronic atrial fibrillation: Secondary | ICD-10-CM | POA: Diagnosis not present

## 2018-04-21 DIAGNOSIS — I10 Essential (primary) hypertension: Secondary | ICD-10-CM | POA: Diagnosis not present

## 2018-04-21 DIAGNOSIS — I739 Peripheral vascular disease, unspecified: Secondary | ICD-10-CM | POA: Diagnosis present

## 2018-04-21 DIAGNOSIS — M79651 Pain in right thigh: Secondary | ICD-10-CM

## 2018-04-21 DIAGNOSIS — M79604 Pain in right leg: Secondary | ICD-10-CM | POA: Diagnosis not present

## 2018-04-21 DIAGNOSIS — S79911A Unspecified injury of right hip, initial encounter: Secondary | ICD-10-CM | POA: Diagnosis not present

## 2018-04-21 DIAGNOSIS — S0990XA Unspecified injury of head, initial encounter: Secondary | ICD-10-CM | POA: Diagnosis not present

## 2018-04-21 DIAGNOSIS — S8991XA Unspecified injury of right lower leg, initial encounter: Secondary | ICD-10-CM | POA: Diagnosis not present

## 2018-04-21 DIAGNOSIS — S79921A Unspecified injury of right thigh, initial encounter: Secondary | ICD-10-CM | POA: Diagnosis not present

## 2018-04-21 DIAGNOSIS — W19XXXA Unspecified fall, initial encounter: Secondary | ICD-10-CM | POA: Diagnosis not present

## 2018-04-21 DIAGNOSIS — I25700 Atherosclerosis of coronary artery bypass graft(s), unspecified, with unstable angina pectoris: Secondary | ICD-10-CM | POA: Diagnosis not present

## 2018-04-21 LAB — TYPE AND SCREEN
ABO/RH(D): O POS
Antibody Screen: NEGATIVE

## 2018-04-21 LAB — CBC
HEMATOCRIT: 39.9 % (ref 39.0–52.0)
HEMOGLOBIN: 13.7 g/dL (ref 13.0–17.0)
MCH: 35.1 pg — ABNORMAL HIGH (ref 26.0–34.0)
MCHC: 34.3 g/dL (ref 30.0–36.0)
MCV: 102.3 fL — AB (ref 78.0–100.0)
Platelets: 170 10*3/uL (ref 150–400)
RBC: 3.9 MIL/uL — ABNORMAL LOW (ref 4.22–5.81)
RDW: 13.8 % (ref 11.5–15.5)
WBC: 11 10*3/uL — AB (ref 4.0–10.5)

## 2018-04-21 LAB — BASIC METABOLIC PANEL
ANION GAP: 9 (ref 5–15)
BUN: 20 mg/dL (ref 8–23)
CHLORIDE: 104 mmol/L (ref 98–111)
CO2: 24 mmol/L (ref 22–32)
Calcium: 9.2 mg/dL (ref 8.9–10.3)
Creatinine, Ser: 0.91 mg/dL (ref 0.61–1.24)
GFR calc non Af Amer: >60 mL/min (ref >60)
Glucose, Bld: 128 mg/dL — ABNORMAL HIGH (ref 70–99)
Potassium: 4.1 mmol/L (ref 3.5–5.1)
Sodium: 137 mmol/L (ref 135–145)

## 2018-04-21 LAB — PROTIME-INR
INR: 2.28
Prothrombin Time: 24.9 seconds — ABNORMAL HIGH (ref 11.4–15.2)

## 2018-04-21 MED ORDER — WARFARIN SODIUM 7.5 MG PO TABS
3.7500 mg | ORAL_TABLET | ORAL | Status: DC
Start: 2018-04-21 — End: 2018-04-21

## 2018-04-21 MED ORDER — DILTIAZEM HCL ER COATED BEADS 240 MG PO CP24
240.0000 mg | ORAL_CAPSULE | Freq: Every day | ORAL | Status: DC
  Administered 2018-04-21 – 2018-04-28 (×8): 240 mg via ORAL
  Filled 2018-04-21 (×15): qty 1

## 2018-04-21 MED ORDER — WARFARIN SODIUM 7.5 MG PO TABS
3.7500 mg | ORAL_TABLET | Freq: Once | ORAL | Status: AC
  Administered 2018-04-21: 3.75 mg via ORAL
  Filled 2018-04-21: qty 1
  Filled 2018-04-21: qty 0.5

## 2018-04-21 MED ORDER — SODIUM CHLORIDE 0.9 % IV SOLN: 250.0000 mL | INTRAVENOUS | Status: DC | PRN

## 2018-04-21 MED ORDER — LIDOCAINE 5 % EX PTCH
1.0000 | MEDICATED_PATCH | CUTANEOUS | Status: DC
  Administered 2018-04-21 – 2018-04-28 (×8): 1 via TRANSDERMAL
  Filled 2018-04-21 (×10): qty 1

## 2018-04-21 MED ORDER — SENNOSIDES-DOCUSATE SODIUM 8.6-50 MG PO TABS
1.0000 | ORAL_TABLET | Freq: Once | ORAL | Status: AC
  Administered 2018-04-21: 1 via ORAL
  Filled 2018-04-21 (×2): qty 1

## 2018-04-21 MED ORDER — RAMIPRIL 1.25 MG PO CAPS
2.5000 mg | ORAL_CAPSULE | Freq: Every day | ORAL | Status: DC
  Administered 2018-04-21 – 2018-04-28 (×8): 2.5 mg via ORAL
  Filled 2018-04-21 (×6): qty 2
  Filled 2018-04-21: qty 1
  Filled 2018-04-21 (×3): qty 2

## 2018-04-21 MED ORDER — WARFARIN - PHARMACIST DOSING INPATIENT
Freq: Every day | Status: DC
  Administered 2018-04-21 – 2018-04-24 (×3)

## 2018-04-21 MED ORDER — HYDROCHLOROTHIAZIDE 12.5 MG PO CAPS
12.5000 mg | ORAL_CAPSULE | Freq: Every day | ORAL | Status: DC
  Administered 2018-04-21 – 2018-04-28 (×8): 12.5 mg via ORAL
  Filled 2018-04-21 (×10): qty 1

## 2018-04-21 MED ORDER — WARFARIN SODIUM 7.5 MG PO TABS
ORAL_TABLET | ORAL | Status: AC
  Filled 2018-04-21: qty 1

## 2018-04-21 MED ORDER — OXYCODONE-ACETAMINOPHEN 5-325 MG PO TABS
1.0000 | ORAL_TABLET | ORAL | Status: DC | PRN
  Administered 2018-04-21 – 2018-04-24 (×24): 1 via ORAL
  Filled 2018-04-21 (×24): qty 1

## 2018-04-21 MED ORDER — NITROGLYCERIN 0.4 MG SL SUBL: 0.4000 mg | SUBLINGUAL_TABLET | SUBLINGUAL | Status: DC | PRN

## 2018-04-21 MED ORDER — ACETAMINOPHEN 650 MG RE SUPP: 650.0000 mg | Freq: Four times a day (QID) | RECTAL | Status: DC | PRN

## 2018-04-21 MED ORDER — METOPROLOL SUCCINATE ER 50 MG PO TB24
75.0000 mg | ORAL_TABLET | Freq: Every day | ORAL | Status: DC
  Administered 2018-04-21 – 2018-04-28 (×8): 75 mg via ORAL
  Filled 2018-04-21 (×6): qty 1
  Filled 2018-04-21: qty 3
  Filled 2018-04-21 (×2): qty 1

## 2018-04-21 MED ORDER — HYDROMORPHONE HCL 1 MG/ML IJ SOLN: 0.5000 mg | INTRAMUSCULAR | Status: DC | PRN

## 2018-04-21 MED ORDER — SODIUM CHLORIDE 0.9% FLUSH
3.0000 mL | INTRAVENOUS | Status: DC | PRN
  Administered 2018-04-26: 3 mL via INTRAVENOUS
  Filled 2018-04-21: qty 3

## 2018-04-21 MED ORDER — ISOSORBIDE MONONITRATE ER 60 MG PO TB24
30.0000 mg | ORAL_TABLET | Freq: Every day | ORAL | Status: DC
  Administered 2018-04-21 – 2018-04-28 (×8): 30 mg via ORAL
  Filled 2018-04-21 (×10): qty 1

## 2018-04-21 MED ORDER — ASPIRIN EC 81 MG PO TBEC
81.0000 mg | DELAYED_RELEASE_TABLET | Freq: Every day | ORAL | Status: DC
  Administered 2018-04-21 – 2018-04-28 (×8): 81 mg via ORAL
  Filled 2018-04-21 (×8): qty 1

## 2018-04-21 MED ORDER — SODIUM CHLORIDE 0.9% FLUSH
3.0000 mL | Freq: Two times a day (BID) | INTRAVENOUS | Status: DC
  Administered 2018-04-21 – 2018-04-27 (×14): 3 mL via INTRAVENOUS

## 2018-04-21 MED ORDER — ACETAMINOPHEN 325 MG PO TABS: 650.0000 mg | ORAL_TABLET | Freq: Four times a day (QID) | ORAL | Status: DC | PRN

## 2018-04-21 MED ORDER — ONDANSETRON HCL 4 MG/2ML IJ SOLN: 4.0000 mg | Freq: Four times a day (QID) | INTRAMUSCULAR | Status: DC | PRN

## 2018-04-21 MED ORDER — WARFARIN SODIUM 7.5 MG PO TABS: 7.5000 mg | ORAL_TABLET | Freq: Every day | ORAL | Status: DC

## 2018-04-21 MED ORDER — ONDANSETRON HCL 4 MG PO TABS: 4.0000 mg | ORAL_TABLET | Freq: Four times a day (QID) | ORAL | Status: DC | PRN

## 2018-04-21 NOTE — Evaluation (Signed)
Physical Therapy Evaluation Patient Details Name: Leonard Cisneros MRN: [id number] DOB: 1941-08-31 Today's Date: 04/21/2018  History of Present Illness  Leonard Cisneros is a 77 y/o male s/p fall with c/o severe right hip pain.  PMHx: CAD with prior CABG, chronic A-fib, dyslipidemia   Clinical Impression  Patient demonstrates slow labored movement for sitting up at bedside with assistance to move RLE due to c/o increasing right hip pain with movement, when standing unable to tolerated weightbearing on RLE due to pain and limited to shuffling on LLE to take 1 side step.  Patient put back to bed with Mod assist to reposition.  Patient will benefit from continued physical therapy in hospital and recommended venue below to increase strength, balance, endurance for safe ADLs and gait.    Follow Up Recommendations SNF    Equipment Recommendations  None recommended by PT    Recommendations for Other Services       Precautions / Restrictions Precautions Precautions: Fall Restrictions Weight Bearing Restrictions: No      Mobility  Bed Mobility Overal bed mobility: Needs Assistance Bed Mobility: Supine to Sit;Sit to Supine     Supine to sit: Mod assist;Max assist Sit to supine: Mod assist      Transfers Overall transfer level: Needs assistance Equipment used: Rolling walker (2 wheeled) Transfers: Sit to/from Stand Sit to Stand: Mod assist         General transfer comment: slow labored movement, unable to tolerate weighbearing on RLE  Ambulation/Gait Ambulation/Gait assistance: Mod assist;Max assist Gait Distance (Feet): 1 Feet(1) Assistive device: Rolling walker (2 wheeled)   Gait velocity: slow   General Gait Details: limited to 1 shuffling side step on LLE, unable to tolerate weight bearing on RLE  Stairs            Wheelchair Mobility    Modified Rankin (Stroke Patients Only)       Balance Overall balance assessment: Needs assistance Sitting-balance  support: Bilateral upper extremity supported;Feet supported Sitting balance-Leahy Scale: Fair Sitting balance - Comments: fair/good supporting with BUE   Standing balance support: Bilateral upper extremity supported;During functional activity Standing balance-Leahy Scale: Fair Standing balance comment: unable to weightbear on RLE due to pain                             Pertinent Vitals/Pain Pain Assessment: Faces Faces Pain Scale: Hurts whole lot Pain Location: right hip, no pain at rest Pain Descriptors / Indicators: Discomfort;Grimacing;Guarding;[place] expects to be discharged to:: Private residence Living Arrangements: Spouse/significant other Available Help at Discharge: Family Type of Home: House                Prior Function Level of Independence: Independent with assistive device(s)         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[employer], drives     Hand Dominance        Extremity/Trunk Assessment   Upper Extremity Assessment Upper Extremity Assessment: Overall WFL for tasks assessed    Lower Extremity Assessment Lower Extremity Assessment: Generalized weakness;RLE deficits/detail RLE Deficits / Details: grossly -3/5 due to pain    Cervical / Trunk Assessment Cervical / Trunk Assessment: Normal  Communication   Communication: No difficulties  Cognition Arousal/Alertness: Awake/alert Behavior During Therapy: WFL for tasks assessed/performed Overall Cognitive Status: Within Functional Limits for tasks assessed  General Comments      Exercises     Assessment/Plan    PT Assessment Patient needs continued PT services  PT Problem List Decreased strength;Decreased activity tolerance;Decreased balance;Decreased mobility       PT Treatment Interventions Therapeutic activities    PT Goals (Current goals can be found in the Care Plan section)  Acute Rehab PT  Goals Patient Stated Goal: return home after rehab PT Goal Formulation: With patient/family Time For Goal Achievement: 05/05/18    Frequency Min 3X/week   Barriers to discharge        Co-evaluation               AM-PAC PT "6 Clicks" Daily Activity  Outcome Measure Difficulty turning over in bed (including adjusting bedclothes, sheets and blankets)?: A Lot Difficulty moving from lying on back to sitting on the side of the bed? : A Lot Difficulty sitting down on and standing up from a chair with arms (e.g., wheelchair, bedside commode, etc,.)?: A Lot Help needed moving to and from a bed to chair (including a wheelchair)?: A Lot Help needed walking in hospital room?: Total Help needed climbing 3-5 steps with a railing? : Total 6 Click Score: 10    End of Session   Activity Tolerance: Patient tolerated treatment well;Patient limited by pain Patient left: in bed;with call bell/phone within reach;with family/visitor present Nurse Communication: Mobility status PT Visit Diagnosis: Unsteadiness on feet (R26.81);Other abnormalities of gait and mobility (R26.89);Muscle weakness (generalized) (M62.81);History of falling (Z91.81)    Time: 0737-1062 PT Time Calculation (min) (ACUTE ONLY): 22 min   Charges:   PT Evaluation $PT Eval Moderate Complexity: 1 Mod PT Treatments $Therapeutic Activity: 8-22 mins        11:27 AM, 04/21/18 [name], MPT Physical Therapist with [facility] [phone] office [phone] mobile phone

## 2018-04-21 NOTE — Plan of Care (Signed)
  Problem: Acute Rehab PT Goals(only PT should resolve) Goal: Pt Will Go Supine/Side To Sit Outcome: Progressing Flowsheets (Taken 04/21/2018 1128) Pt will go Supine/Side to Sit: with minimal assist Goal: Patient Will Transfer Sit To/From Stand Outcome: Progressing Flowsheets (Taken 04/21/2018 1128) Patient will transfer sit to/from stand: with minimal assist Goal: Pt Will Transfer Bed To Chair/Chair To Bed Outcome: Progressing Flowsheets (Taken 04/21/2018 1128) Pt will Transfer Bed to Chair/Chair to Bed: with min assist Goal: Pt Will Ambulate Outcome: Progressing Flowsheets (Taken 04/21/2018 1128) Pt will Ambulate: 25 feet; with rolling walker; with moderate assist   11:29 AM, 04/21/18 [name], MPT Physical Therapist with [facility] [phone] office [phone] mobile phone

## 2018-04-21 NOTE — NC FL2 (Signed)
[place] MEDICAID FL2 LEVEL OF CARE SCREENING TOOL     IDENTIFICATION  Patient Name: Leonard Cisneros Birthdate: 06-10-1941 Sex: male Admission Date (Current Location): 04/20/2018  [facility] and [place] Number:  [employer] and Address:  [place] [address] [place]      Provider Number: [id number]  Attending Physician Name and Address:  Annita Brod, MD  Relative Name and Phone Number:  Syair Fricker (wife) [phone]    Current Level of Care: Hospital Recommended Level of Care: [place] Prior Approval Number:    Date Approved/Denied:   PASRR Number: [id number]  Discharge Plan: SNF    Current Diagnoses: Patient Active Problem List   Diagnosis Date Noted  . Fall 04/21/2018  . PAD (peripheral artery disease) ([place]) 04/21/2018  . Hx of adenomatous colonic polyps 08/20/2017  . Cardiomyopathy, ischemic 02/02/2016  . Obesity 02/02/2016  . CAD (coronary artery disease) of artery bypass graft 02/01/2016  . Coronary artery disease involving coronary bypass graft of native heart with unstable angina pectoris ([place])   . Exertional angina ([place]) 01/28/2016  . Hyperlipidemia LDL goal <70 01/28/2016  . Neuropathic pain 06/13/2015  . Mild obesity 04/20/2015  . Essential hypertension 04/20/2015  . Unspecified hereditary and idiopathic peripheral neuropathy 04/04/2014  . Foreign body (FB) in soft tissue 03/31/2014  . Impaired glucose metabolism 03/31/2014  . Morbidly obese ([place]) 03/31/2014  . CAD (coronary artery disease) 01/04/2014  . Rotator cuff tear, left 10/22/2013  . Rotator cuff syndrome of left shoulder 10/22/2013  . Chronic atrial fibrillation ([place]) 06/18/2013  . Long term (current) use of anticoagulants 06/18/2013  . Screening for colon cancer 06/18/2012  . High risk medication use 06/18/2012    Orientation RESPIRATION BLADDER Height & Weight     Self, Time, Situation, Place  Normal Continent Weight:  230 lb (104.3 kg) Height:  5\' 11"  (180.3 cm)  BEHAVIORAL SYMPTOMS/MOOD NEUROLOGICAL BOWEL NUTRITION STATUS      Continent Diet(see dc summary)  AMBULATORY STATUS COMMUNICATION OF NEEDS Skin   Extensive Assist Verbally Skin abrasions(R knee)                       Personal Care Assistance Level of Assistance  Bathing, Feeding, Dressing Bathing Assistance: Limited assistance Feeding assistance: Independent Dressing Assistance: Limited assistance     Functional Limitations Info  Sight, Hearing, Speech Sight Info: Adequate Hearing Info: Adequate Speech Info: Adequate    SPECIAL CARE FACTORS FREQUENCY  PT (By licensed PT)     PT Frequency: 5 times week              Contractures Contractures Info: Not present    Additional Factors Info  Code Status, Allergies Code Status Info: full Allergies Info: Augmentin           Current Medications (04/21/2018):  This is the current hospital active medication list Current Facility-Administered Medications  Medication Dose Route Frequency Provider Last Rate Last Dose  . 0.9 %  sodium chloride infusion  250 mL Intravenous PRN Manuella Ghazi, Pratik D, DO      . acetaminophen (TYLENOL) tablet 650 mg  650 mg Oral Q6H PRN Manuella Ghazi, Pratik D, DO       Or  . acetaminophen (TYLENOL) suppository 650 mg  650 mg Rectal Q6H PRN Manuella Ghazi, Pratik D, DO      . aspirin EC tablet 81 mg  81 mg Oral Daily Shah, Pratik D, DO      .  diltiazem (DILACOR XR) 24 hr capsule 240 mg  240 mg Oral Daily Shah, Pratik D, DO      . hydrochlorothiazide (MICROZIDE) capsule 12.5 mg  12.5 mg Oral Daily Shah, Pratik D, DO      . isosorbide mononitrate (IMDUR) 24 hr tablet 30 mg  30 mg Oral Daily Shah, Pratik D, DO      . lidocaine (LIDODERM) 5 % 1 patch  1 patch Transdermal Q24H Annita Brod, MD      . metoprolol succinate (TOPROL-XL) 24 hr tablet 75 mg  75 mg Oral Daily Shah, Pratik D, DO      . nitroGLYCERIN (NITROSTAT) SL tablet 0.4 mg  0.4 mg Sublingual Q5 min PRN Manuella Ghazi,  Pratik D, DO      . ondansetron (ZOFRAN) tablet 4 mg  4 mg Oral Q6H PRN Manuella Ghazi, Pratik D, DO       Or  . ondansetron (ZOFRAN) injection 4 mg  4 mg Intravenous Q6H PRN Manuella Ghazi, Pratik D, DO      . oxyCODONE-acetaminophen (PERCOCET/ROXICET) 5-325 MG per tablet 1 tablet  1 tablet Oral Q3H PRN Manuella Ghazi, Pratik D, DO   1 tablet at 04/21/18 0951  . ramipril (ALTACE) capsule 2.5 mg  2.5 mg Oral Daily Shah, Pratik D, DO      . sodium chloride flush (NS) 0.9 % injection 3 mL  3 mL Intravenous Q12H Shah, Pratik D, DO   3 mL at 04/21/18 0254  . sodium chloride flush (NS) 0.9 % injection 3 mL  3 mL Intravenous PRN Manuella Ghazi, Pratik D, DO      . warfarin (COUMADIN) tablet 3.75 mg  3.75 mg Oral Once Annita Brod, MD      . Warfarin - Pharmacist Dosing Inpatient   Does not apply [id number] Annita Brod, MD       Current Outpatient Medications  Medication Sig Dispense Refill  . acetaminophen (TYLENOL) 500 MG tablet Take 1,000 mg by mouth every 6 (six) hours as needed for moderate pain or headache.    [name] aspirin EC 81 MG tablet Take 1 tablet (81 mg total) by mouth daily. 90 tablet 3  . diltiazem (DILACOR XR) 240 MG 24 hr capsule Take 1 capsule (240 mg total) by mouth daily. KEEP OV. 90 capsule 3  . hydrochlorothiazide (MICROZIDE) 12.5 MG capsule Take 1 capsule (12.5 mg total) by mouth daily. 90 capsule 1  . HYDROcodone-acetaminophen (NORCO/VICODIN) 5-325 MG tablet Take 1 tablet by mouth every 6 (six) hours as needed for moderate pain. 30 tablet 0  . isosorbide mononitrate (IMDUR) 30 MG 24 hr tablet TAKE 1 TABLET BY MOUTH EVERY DAY 30 tablet 9  . metoprolol succinate (TOPROL-XL) 50 MG 24 hr tablet TAKE 1 & 1/2 TABLETS BY MOUTH EVERY DAY 90 tablet 2  . nitroGLYCERIN (NITROSTAT) 0.4 MG SL tablet DISSOLVE 1 TABLET UNDER THE TONGUE EVERY 5 MINUTES UP TO 15 MINUTES FOR CHEST PAIN 25 tablet 0  . ramipril (ALTACE) 2.5 MG capsule TAKE ONE CAPSULE BY MOUTH EVERY DAY (Patient taking differently: TAKE 2.5 MG BY MOUTH EVERY DAY) 90  capsule 2  . warfarin (COUMADIN) 7.5 MG tablet TAKE 1/2 TO 1 TABLET BY MOUTH DAILY AS DIRECTED (Patient taking differently: Take 3.75-7.5 mg by mouth See admin instructions. Take 3.75 mg by mouth daily on Monday and Friday. Take 7.5 mg by mouth daily on all other days) 30 tablet 3  . warfarin (COUMADIN) 7.5 MG tablet Take 1/2 to 1 tablet by mouth daily  as directed. 90 tablet 0  . predniSONE (DELTASONE) 20 MG tablet Take 3 tablets for 3 days, then take 2 tablets for 3 days, then take one tablet for 3 days. (Patient not taking: Reported on 04/21/2018) 18 tablet 0     Discharge Medications: Please see discharge summary for a list of discharge medications.  Relevant Imaging Results:  Relevant Lab Results:   Additional Information SSN: [id number] [place]

## 2018-04-21 NOTE — H&P (Addendum)
History and Physical    DOVER HEAD JJO:[id number] DOB: August 21, 1941 DOA: 04/20/2018  PCP: [name], MD   Patient coming from: Home  Chief Complaint: Fall with R hip pain  HPI: Leonard Cisneros is a 77 y.o. male with medical history significant for CAD with prior CABG, CHF, chronic atrial fibrillation on Coumadin, dyslipidemia, hypertension, PAD, peripheral neuropathy, and prior bilateral hip replacements with revisions last performed in 2012.  He presented to the ED after development of severe right-sided hip pain after a mechanical fall [place] [employer].  He states that he was walking back towards his home and landed directly on his right hip on the asphalt after tripping over his Crocs.  He also had a slight scrape to his right knee but denies hitting his head or back and did not have any loss of consciousness.  He was actually able to walk around his house for a little while and even climb up the stairs, but shortly after lunchtime at approximately 2 PM his pain suddenly intensified and he could not even come down the stairs.  He had to call EMS to assist him into his vehicle and he drove with his wife here for further evaluation as the pain continued to worsen especially with movement.  Hip flexion and elevation of his leg causes the most severe pain in his anterior medial thigh and groin.  He did not notice any bruising or swelling to the area.  He tried taking some Tylenol without any relief.  Of note, patient does have chronic low back pain and is scheduled for an upcoming MRI, but denies any worsening of his low back pain.   ED Course: Vital signs are stable in the ED and labs demonstrate only a mild leukocytosis of 11,000 and a glucose of 128.  His INR is therapeutic at 2.28.  Multiple imaging studies were done of the pelvic hip and knee regions with no acute fractures identified.  CT scan of the hip is also negative for any acute fractures.  There also appears  to be no soft tissue edema or hematoma present.  CT of the head is also negative for any acute findings.  Unfortunately, the patient can hardly get up without severe pain and cannot even weight-bear to walk despite receiving Dilaudid in the ED.  Review of Systems: All others reviewed and otherwise negative.  Past Medical History:  Diagnosis Date  . Atrial flutter ([place])    a. s/p remote ablation by Dr. [name].  [name] CAD (coronary artery disease)    a. s/p CABG 2003. b. 01/2016: unstable angina - s/p BMS to SVG-ramus intermediate, patent LIMA-mLAD, patent RIMA-dRCA.   [name] CHF (congestive heart failure) ([place])   . Chronic atrial fibrillation ([facility])    a. initially post op CABG then chronic. On Coumadin.  . Chronic lower back pain   . Essential hypertension   . High cholesterol   . Ischemic cardiomyopathy    a. Cath 01/2016 -  EF 50-55% with mild distal inferior hypocontractility.  [name] Unspecified hereditary and idiopathic peripheral neuropathy 04/04/2014    Past Surgical History:  Procedure Laterality Date  . BACK SURGERY    . CARDIAC CATHETERIZATION  ~ 2000; 2003  . CARDIAC CATHETERIZATION N/A 02/01/2016   Procedure: Left Heart Cath and Cors/Grafts Angiography;  Surgeon: [name], MD;  Location: [facility];  Service: Cardiovascular;  Laterality: N/A;  . CARDIAC CATHETERIZATION N/A 02/01/2016   Procedure: Coronary  Stent Intervention;  Surgeon: [name], MD;  Location: [facility];  Service: Cardiovascular;  Laterality: N/A;  . COLONOSCOPY  06/04/2002   Normal colon and rectum  . COLONOSCOPY  07/08/2012   Procedure: COLONOSCOPY;  Surgeon: [name], MD;  [facility];  Service: Endoscopy;  Laterality: N/A;  11:30  . COLONOSCOPY N/A 10/02/2017   Procedure: COLONOSCOPY;  Surgeon: [name], MD;  [facility];  Service: Endoscopy;  Laterality: N/A;  10:30am  . CORONARY ANGIOPLASTY WITH STENT PLACEMENT  02/01/2016  . CORONARY ARTERY BYPASS GRAFT   2003   LIMA to LAD,LIMA to distal RCA,SVG to ramus intermediate vessel.  . FOREIGN BODY REMOVAL Right 04/02/2014   Procedure: FOREIGN BODY REMOVAL RIGHT FOOT;  Surgeon: [name], MD;  Location: [facility];  Service: General;  Laterality: Right;  . JOINT REPLACEMENT    . JOINT REPLACEMENT    . LUMBAR DISC SURGERY    . REVISION TOTAL HIP ARTHROPLASTY Bilateral 2005-2012   right-left  . SHOULDER OPEN ROTATOR CUFF REPAIR Right   . TOTAL HIP ARTHROPLASTY Right 1999  . TOTAL HIP ARTHROPLASTY Left 2008  . 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     reports that he quit smoking about 51 years ago. His smoking use included cigarettes. He has a 1.00 pack-year smoking history. He has never used smokeless tobacco. He reports that he drinks about 11.0 standard drinks of alcohol per week. He reports that he does not use drugs.  Allergies  Allergen Reactions  . Augmentin [Amoxicillin-Pot Clavulanate] Nausea And Vomiting and Other (See Comments)    Has patient had a PCN reaction causing immediate rash, facial/tongue/throat swelling, SOB or lightheadedness with hypotension: Unknown Has patient had a PCN reaction causing severe rash involving mucus membranes or skin necrosis: No Has patient had a PCN reaction that required hospitalization: No Has patient had a PCN reaction occurring within the last 10 years: No If all of the above answers are "NO", then may proceed with Cephalosporin use.     Family History  Problem Relation Age of Onset  . Heart attack Brother   . Colon cancer Neg Hx     Prior to Admission medications   Medication Sig Start Date End Date Taking? Authorizing Provider  acetaminophen (TYLENOL) 500 MG tablet Take 1,000 mg by mouth every 6 (six) hours as needed for moderate pain or headache.     [provider]  aspirin EC 81 MG tablet Take 1 tablet (81 mg total) by mouth daily. 01/26/16   [name], MD  diltiazem (DILACOR XR) 240 MG 24 hr capsule Take 1 capsule (240 mg total) by mouth daily. KEEP OV. 06/14/16   [name], MD  hydrochlorothiazide (MICROZIDE) 12.5 MG capsule Take 1 capsule (12.5 mg total) by mouth daily. 11/11/17 02/09/18  [name], MD  HYDROcodone-acetaminophen (NORCO/VICODIN) 5-325 MG tablet Take 1 tablet by mouth every 6 (six) hours as needed for moderate pain. 12/28/16   Carole Civil, MD  isosorbide mononitrate (IMDUR) 30 MG 24 hr tablet TAKE 1 TABLET BY MOUTH EVERY DAY 12/04/17   [name], MD  metoprolol succinate (TOPROL-XL) 50 MG 24 hr tablet TAKE 1 & 1/2 TABLETS BY MOUTH EVERY DAY 12/09/17   [name], MD  Na Sulfate-K Sulfate-Mg  Sulf 17.5-3.13-1.6 GM/177ML SOLN Take 1 kit by mouth as directed. 09/04/17   Rourk, Cristopher Estimable, MD  nitroGLYCERIN (NITROSTAT) 0.4 MG SL tablet DISSOLVE 1 TABLET UNDER THE TONGUE EVERY 5 MINUTES UP TO 15 MINUTES FOR CHEST PAIN 01/29/18   [name], MD  predniSONE (DELTASONE) 20 MG tablet Take 3 tablets for 3 days, then take 2 tablets for 3 days, then take one tablet for 3 days. 04/01/18   [name], MD  ramipril (ALTACE) 2.5 MG capsule TAKE ONE CAPSULE BY MOUTH EVERY DAY Patient taking differently: TAKE 2.5 MG BY MOUTH EVERY DAY 07/11/17   [name], MD  warfarin (COUMADIN) 7.5 MG tablet TAKE 1/2 TO 1 TABLET BY MOUTH DAILY AS DIRECTED Patient taking differently: Take 3.75-7.5 mg by mouth See admin instructions. Take 3.75 mg by mouth daily on Monday and Friday. Take 7.5 mg by mouth daily on all other days 08/28/17   [name], MD  warfarin (COUMADIN) 7.5 MG tablet Take 1/2 to 1 tablet by mouth daily as directed. 02/10/18   [name], MD    Physical Exam: Vitals:   04/20/18 2121 04/20/18 2122 04/21/18 0007 04/21/18 0013  BP: (!) 133/107  133/84   Pulse: 81  77 77  Resp: 18  18    Temp: 98.3 F (36.8 C)     TempSrc: Oral     SpO2: 97%  96% 95%  Weight:  104.3 kg    Height:  _0  (1.803 m)      Constitutional: NAD, calm, comfortable Vitals:   04/20/18 2121 04/20/18 2122 04/21/18 0007 04/21/18 0013  BP: (!) 133/107  133/84   Pulse: 81  77 77  Resp: 18  18   Temp: 98.3 F (36.8 C)     TempSrc: Oral     SpO2: 97%  96% 95%  Weight:  104.3 kg    Height:  _1  (1.803 m)     Eyes: lids and conjunctivae normal ENMT: Mucous membranes are moist.  Neck: normal, supple Respiratory: clear to auscultation bilaterally. Normal respiratory effort. No accessory muscle use.  Cardiovascular: Regular rate and rhythm, no murmurs. No extremity edema. Abdomen: no tenderness, no distention. Bowel sounds positive.  Musculoskeletal:  No joint deformity upper and lower extremities.  No tenderness to palpation over the right hip.  Severe pain to the groin and anterior thigh region with passive elevation of right leg. Skin: no rashes, lesions, ulcers.  Psychiatric: Normal judgment and insight. Alert and oriented x 3. Normal mood.   Labs on Admission: I have personally reviewed following labs and imaging studies  CBC: Recent Labs  Lab 04/21/18 0005  WBC 11.0*  HGB 13.7  HCT 39.9  MCV 102.3*  PLT 324   Basic Metabolic Panel: Recent Labs  Lab 04/21/18 0005  NA 137  K 4.1  CL 104  CO2 24  GLUCOSE 128*  BUN 20  CREATININE 0.91  CALCIUM 9.2   GFR: Estimated Creatinine Clearance: 84.9 mL/min (by C-G formula based on SCr of 0.91 mg/dL). Liver Function Tests: No results for input(s): AST, ALT, ALKPHOS, BILITOT, PROT, ALBUMIN in the last 168 hours. No results for input(s): LIPASE, AMYLASE in the last 168 hours. No results for input(s): AMMONIA in the last 168 hours. Coagulation Profile: Recent Labs  Lab 04/15/18 1405 04/21/18 0005  INR 2.7* 2.28   Cardiac Enzymes: No results for input(s): CKTOTAL, CKMB, CKMBINDEX, TROPONINI in the last 168 hours. BNP (last  3 results) No  results for input(s): PROBNP in the last 8760 hours. HbA1C: No results for input(s): HGBA1C in the last 72 hours. CBG: No results for input(s): GLUCAP in the last 168 hours. Lipid Profile: No results for input(s): CHOL, HDL, LDLCALC, TRIG, CHOLHDL, LDLDIRECT in the last 72 hours. Thyroid Function Tests: No results for input(s): TSH, T4TOTAL, FREET4, T3FREE, THYROIDAB in the last 72 hours. Anemia Panel: No results for input(s): VITAMINB12, FOLATE, FERRITIN, TIBC, IRON, RETICCTPCT in the last 72 hours. Urine analysis:    Component Value Date/Time   COLORURINE YELLOW 11/28/2010 0915   APPEARANCEUR CLEAR 11/28/2010 0915   LABSPEC 1.015 11/28/2010 0915   PHURINE 6.0 11/28/2010 0915   GLUCOSEU NEGATIVE 11/28/2010 0915   HGBUR NEGATIVE 11/28/2010 0915   BILIRUBINUR NEGATIVE 11/28/2010 0915   KETONESUR NEGATIVE 11/28/2010 0915   PROTEINUR NEGATIVE 11/28/2010 0915   UROBILINOGEN 0.2 11/28/2010 0915   NITRITE NEGATIVE 11/28/2010 0915   LEUKOCYTESUR  11/28/2010 0915    NEGATIVE MICROSCOPIC NOT DONE ON URINES WITH NEGATIVE PROTEIN, BLOOD, LEUKOCYTES, NITRITE, OR GLUCOSE <1000 mg/dL.    Radiological Exams on Admission: Ct Head Wo Contrast  Result Date: 04/21/2018 CLINICAL DATA:  Patient fell today on asphalt. No loss of consciousness. EXAM: CT HEAD WITHOUT CONTRAST TECHNIQUE: Contiguous axial images were obtained from the base of the skull through the vertex without intravenous contrast. COMPARISON:  None. FINDINGS: Brain: No evidence of acute infarction, hemorrhage, hydrocephalus, extra-axial collection or mass lesion/mass effect. Diffuse cerebral atrophy. Vascular: Moderate intracranial arterial vascular calcifications are present. Skull: Calvarium appears intact. No acute depressed skull fractures. Sinuses/Orbits: Mucosal thickening of the paranasal sinuses with opacification of multiple ethmoid air cells. No acute air-fluid levels. Mastoid air cells are clear. Other: None.  IMPRESSION: No acute intracranial abnormalities. Chronic atrophy. Probable inflammatory changes in the paranasal sinuses. Electronically Signed   By: Lucienne Capers M.D.   On: 04/21/2018 00:44   Ct Hip Right Wo Contrast  Result Date: 04/21/2018 CLINICAL DATA:  Hip trauma EXAM: CT OF THE RIGHT HIP WITHOUT CONTRAST TECHNIQUE: Multidetector CT imaging of the right hip was performed according to the standard protocol. Multiplanar CT image reconstructions were also generated. COMPARISON:  Radiographs 04/20/2018, 12/06/2010, 05/03/2010 FINDINGS: Bones/Joint/Cartilage Status post right hip replacement with associated artifact. No dislocation evident. No definitive fracture lucency is seen. The right pubic rami appear intact. Amorphous material along the inter margin of the right acetabulum, may reflect mild dystrophic calcification or postsurgical change, radiographically this appears stable. No large hip effusion. Ligaments Suboptimally assessed by CT. Muscles and Tendons No intramuscular fluid collections. Soft tissues Mild soft tissue thickening adjacent to the trochanter, likely due to postsurgical change. No hyperdense fluid collections within the soft tissues to suggest soft tissue hematoma. Dense vascular calcifications. IMPRESSION: Status post right hip replacement.  No definitive fracture is seen. Electronically Signed   By: Donavan Foil M.D.   On: 04/21/2018 01:16   Dg Knee Complete 4 Views Right  Result Date: 04/21/2018 CLINICAL DATA:  Fall EXAM: RIGHT KNEE - COMPLETE 4+ VIEW COMPARISON:  10/22/2016 FINDINGS: No fracture or malalignment. Mild patellofemoral and medial joint space degenerative change. Mild degenerative change of the lateral compartment. No significant the fusion. Vascular calcification. IMPRESSION: Degenerative changes without acute osseous abnormality. Electronically Signed   By: Donavan Foil M.D.   On: 04/21/2018 00:36   Dg Hip Unilat W Or Wo Pelvis 2-3 Views Right  Result Date:  04/20/2018 CLINICAL DATA:  Fall with history of hip replacements EXAM: DG HIP (WITH OR WITHOUT  PELVIS) 2-3V RIGHT COMPARISON:  None. FINDINGS: Pubic symphysis and rami are intact. Status post left hip replacement with normal alignment. Status post right hip replacement without fracture or malalignment. IMPRESSION: Status post bilateral hip replacements without acute osseous abnormality Electronically Signed   By: Donavan Foil M.D.   On: 04/20/2018 22:23   Dg Femur 1 View Right  Result Date: 04/21/2018 CLINICAL DATA:  Fall with leg pain EXAM: RIGHT FEMUR 1 VIEW COMPARISON:  None. FINDINGS: Status post right hip replacement with normal alignment. No fracture or dislocation. Vascular calcification IMPRESSION: No acute osseous abnormality Electronically Signed   By: Donavan Foil M.D.   On: 04/21/2018 00:37    Assessment/Plan Principal Problem:   Fall Active Problems:   Chronic atrial fibrillation ([facility])   Morbidly obese ([facility])   Essential hypertension   Neuropathic pain   Hyperlipidemia LDL goal <70   CAD (coronary artery disease) of artery bypass graft   PAD (peripheral artery disease) ([place])    1. Right-sided hip pain status post mechanical fall.  No acute fractures, dislocation, or soft tissue edema/hematoma identified.  Patient does have complicated history of hip replacements and revisions in the past and therefore, will consult orthopedic surgery for further evaluation prior to consideration of further imaging such as MRI.  PT evaluation and fall precautions.  Continue pain management with Percocet every 3 hours as needed.  Apply ice to the area. 2. Chronic atrial fibrillation.  Appears rate controlled.  Continue diltiazem and warfarin per pharmacy management.  Currently therapeutic INR. 3. CAD with prior CABG.  Continue aspirin, metoprolol, and Imdur.  Maintain on heart healthy diet. 4. Hypertension-controlled.  Continue HCTZ, diltiazem, metoprolol, Imdur, and ramipril.   DVT prophylaxis:  On warfarin Code Status: Full Family Communication: None at bedside Disposition Plan:PT and Ortho evaluation in am; further imaging as indicated Consults called:Ortho in Computer Admission status: Obs, Med-Surg   Donne Baley D [facility] Pager [phone]  If 7PM-7AM, please contact night-coverage [email or web address] Password [email or web address]  04/21/2018, 2:12 AM

## 2018-04-21 NOTE — Care Management Obs Status (Signed)
[facility] NOTIFICATION   Patient Details  Name: Leonard Cisneros MRN: [id number] Date of Birth: Jan 17, 1941   Medicare Observation Status Notification Given:  Yes    Sherald Barge, RN 04/21/2018, 11:19 AM

## 2018-04-21 NOTE — Progress Notes (Signed)
ANTICOAGULATION CONSULT NOTE - Initial Consult  Pharmacy Consult for warfarin Indication: atrial fibrillation  Allergies  Allergen Reactions  . Augmentin [Amoxicillin-Pot Clavulanate] Nausea And Vomiting and Other (See Comments)    Has patient had a PCN reaction causing immediate rash, facial/tongue/throat swelling, SOB or lightheadedness with hypotension: Unknown Has patient had a PCN reaction causing severe rash involving mucus membranes or skin necrosis: No Has patient had a PCN reaction that required hospitalization: No Has patient had a PCN reaction occurring within the last 10 years: No If all of the above answers are "NO", then may proceed with Cephalosporin use.     Patient Measurements: Height: 5\' 11"  (180.3 cm) Weight: 230 lb (104.3 kg) IBW/kg (Calculated) : 75.3   Vital Signs: BP: 133/73 (08/19 0300) Pulse Rate: 96 (08/19 0300)  Labs: Recent Labs    04/21/18 0005  HGB 13.7  HCT 39.9  PLT 170  LABPROT 24.9*  INR 2.28  CREATININE 0.91    Estimated Creatinine Clearance: 84.9 mL/min (by C-G formula based on SCr of 0.91 mg/dL).   Medical History: Past Medical History:  Diagnosis Date  . Atrial flutter ([place])    a. s/p remote ablation by Dr. [name].  [name] CAD (coronary artery disease)    a. s/p CABG 2003. b. 01/2016: unstable angina - s/p BMS to SVG-ramus intermediate, patent LIMA-mLAD, patent RIMA-dRCA.   [name] CHF (congestive heart failure) ([place])   . Chronic atrial fibrillation (HCC)    a. initially post op CABG then chronic. On Coumadin.  . Chronic lower back pain   . Essential hypertension   . High cholesterol   . Ischemic cardiomyopathy    a. Cath 01/2016 -  EF 50-55% with mild distal inferior hypocontractility.  [name] Unspecified hereditary and idiopathic peripheral neuropathy 04/04/2014    Medications:   (Not in a hospital admission)  Assessment: Pharmacy consulted to dose warfarin in patient with history of atrial fibrillation.  [name] INR on admission  is therapeutic at 2.28. [name] home dose of warfarin is 3.75 mg MF and 7.5 mg ROW.  Goal of Therapy:  INR 2-3 Monitor platelets by anticoagulation protocol: Yes   Plan:  Warfarin 3.75 mg x 1 dose Monitor daily INR and s/s of bleeding  Revonda Standard Dianara Smullen 04/21/2018,10:53 AM

## 2018-04-21 NOTE — ED Notes (Signed)
Pt states that he is unable to ambulate due to pain.

## 2018-04-21 NOTE — Progress Notes (Signed)
   Follow Up Note  HPI: 77 year old male with past medical history of CAD status post CABG, chronic atrial fibrillation and diastolic heart failure who is underwent bilateral hip replacements with bilateral revisions for different reasons last done in 2012 presented to the emergency room on 8/18 evening after having a mechanical fall and landing on his right side.  He started having severe pain on the frontal and lateral aspect of his right thigh being unable to stand or bear weight.  Pain was severe whenever he tried to flex at the area of the hip.  Patient was brought in CT scan of the hip was negative for acute fracture and hip and femur x-rays were negative as well.  Patient continued to have severe pain and was unable to bear weight.  Rest of labs were unremarkable other than a minimally elevated white blood cell count of 11,000.  Hospitalist were called for further evaluation.  Orthopedics have been consulted. Pt admitted earlier this morning.  Seen while still in the emergency room.  Still complaining of severe pain although only when flexing at the area of the hip or bending of his knee.  He has no pain when his leg is straight and he is not moving.  Otherwise he is doing okay no complaints.  Exam: CV: Irregular rhythm, rate controlled Lungs: Clear to auscultation bilaterally Abd: Soft, nontender, nondistended, positive bowel sounds Musculoskeletal: Patient has severe 9/10 pain relieved with minimal straight leg raising or attempts to flex his left knee.  His pain is located exclusively in the frontal and lateral right thigh with no pain up in the acetabular area with no radiation of pain over into the groin.  There is some tenderness with moderate palpation. Principal Problem:   Fall with left leg pain.  I do not think that this is in any way related to his previous hip replacement or occult hip fracture.  The pain is not migratory to those areas.  He do not think that this is a bursitis given  the location of his pain.  Have started a lidocaine patch and physical therapy seeing recommending short-term skilled nursing.  Awaiting orthopedic evaluation follow-up. Active Problems:   Chronic atrial fibrillation [facility]): Chads 2 score of 4.  On Coumadin, INR therapeutic, pharmacy monitoring. Obesity [facility]): Patient meets criteria with BMI greater than 30   Essential hypertension: Stable continue home medications   Neuropathic pain: Continue home medications   Hyperlipidemia LDL goal <70   CAD (coronary artery disease) of artery bypass graft: Stable   PAD (peripheral artery disease) (HCC)   Disposition: To short-term skilled nursing

## 2018-04-21 NOTE — Clinical Social Work Note (Signed)
Clinical Social Work Assessment  Patient Details  Name: Leonard Cisneros MRN: [id number] Date of Birth: Jan 14, 1941  Date of referral:  04/21/18               Reason for consult:  Facility Placement, Discharge Planning                Permission sought to share information with:  Chartered certified accountant granted to share information::  Yes, Verbal Permission Granted  Name::        Agency::  [place]  Relationship::     Contact Information:     Housing/Transportation Living arrangements for the past 2 months:  Single Family Home Source of Information:  Patient Patient Interpreter Needed:  None Criminal Activity/Legal Involvement Pertinent to Current Situation/Hospitalization:  No - Comment as needed Significant Relationships:  Spouse Lives with:  Spouse Do you feel safe going back to the place where you live?  Yes Need for family participation in patient care:  No (Coment)  Care giving concerns: PT recommending SNF rehab   Social Worker assessment / plan: Pt is a 77 year old male referred to CSW for SNF rehab placement. Met with pt and his wife to assess. Pt has previously been independent in ADLs at home. He is agreeable to SNF rehab. Pt requests referral to [facility]. Will start referral and PASSR screen. Pt will need authorization from his [facility] before he can dc to SNF.  Employment status:  Retired Nurse, adult PT Recommendations:  [place] / Referral to community resources:  [place]  Patient/Family's Response to care: Pt accepting of care.  Patient/Family's Understanding of and Emotional Response to Diagnosis, Current Treatment, and Prognosis: Pt has a good understanding of diagnosis and treatment recommendations. No emotional distress identified.  Emotional Assessment Appearance:  Appears stated age Attitude/Demeanor/Rapport:  Engaged Affect (typically observed):  Calm,  Pleasant Orientation:  Oriented to Self, Oriented to Place, Oriented to  Time, Oriented to Situation Alcohol / Substance use:  Not Applicable Psych involvement (Current and /or in the community):  No (Comment)  Discharge Needs  Concerns to be addressed:  Discharge Planning Concerns Readmission within the last 30 days:  No Current discharge risk:  Physical Impairment Barriers to Discharge:  [place], LCSW 04/21/2018, 10:57 AM

## 2018-04-22 ENCOUNTER — Observation Stay (HOSPITAL_COMMUNITY): Payer: Medicare HMO

## 2018-04-22 DIAGNOSIS — M25551 Pain in right hip: Secondary | ICD-10-CM

## 2018-04-22 DIAGNOSIS — E785 Hyperlipidemia, unspecified: Secondary | ICD-10-CM | POA: Diagnosis not present

## 2018-04-22 DIAGNOSIS — M79651 Pain in right thigh: Secondary | ICD-10-CM | POA: Diagnosis not present

## 2018-04-22 DIAGNOSIS — E669 Obesity, unspecified: Secondary | ICD-10-CM | POA: Diagnosis not present

## 2018-04-22 DIAGNOSIS — I482 Chronic atrial fibrillation: Secondary | ICD-10-CM | POA: Diagnosis not present

## 2018-04-22 DIAGNOSIS — M549 Dorsalgia, unspecified: Secondary | ICD-10-CM | POA: Diagnosis not present

## 2018-04-22 DIAGNOSIS — W19XXXA Unspecified fall, initial encounter: Secondary | ICD-10-CM

## 2018-04-22 DIAGNOSIS — S3992XA Unspecified injury of lower back, initial encounter: Secondary | ICD-10-CM | POA: Diagnosis not present

## 2018-04-22 DIAGNOSIS — S72114A Nondisplaced fracture of greater trochanter of right femur, initial encounter for closed fracture: Secondary | ICD-10-CM | POA: Diagnosis not present

## 2018-04-22 DIAGNOSIS — I25700 Atherosclerosis of coronary artery bypass graft(s), unspecified, with unstable angina pectoris: Secondary | ICD-10-CM | POA: Diagnosis not present

## 2018-04-22 DIAGNOSIS — I739 Peripheral vascular disease, unspecified: Secondary | ICD-10-CM | POA: Diagnosis not present

## 2018-04-22 DIAGNOSIS — I10 Essential (primary) hypertension: Secondary | ICD-10-CM | POA: Diagnosis not present

## 2018-04-22 DIAGNOSIS — M792 Neuralgia and neuritis, unspecified: Secondary | ICD-10-CM | POA: Diagnosis not present

## 2018-04-22 LAB — PROTIME-INR
INR: 1.85
Prothrombin Time: 21.1 seconds — ABNORMAL HIGH (ref 11.4–15.2)

## 2018-04-22 MED ORDER — WARFARIN SODIUM 7.5 MG PO TABS
7.5000 mg | ORAL_TABLET | Freq: Once | ORAL | Status: AC
  Administered 2018-04-22: 7.5 mg via ORAL
  Filled 2018-04-22: qty 1

## 2018-04-22 NOTE — Progress Notes (Signed)
Rounding completed. Patient resting quietly in supine position. Call bell within reach, bed in low position. Will continue to monitor.

## 2018-04-22 NOTE — Progress Notes (Addendum)
Physical Therapy Treatment Patient Details Name: Leonard Cisneros MRN: [id number] DOB: [birth year] Today's Date: 04/22/2018    History of Present Illness Leonard Cisneros is a 77 y/o male s/p fall with c/o severe right hip pain.  PMHx: CAD with prior CABG, chronic A-fib, dyslipidemia    PT Comments    Patient demonstrates slight improvement for moving RLE during bed mobility and able to take a few steps at bedside with poor tolerance for weightbearing on RLE, was NWB on right foot most of time with occasional touchdown, but unable to touch right heel to floor due to increased hip pain.  Patient tolerated sitting up in chair with his spouse present in room after therapy - RN notified.  Patient will benefit from continued physical therapy in hospital and recommended venue below to increase strength, balance, endurance for safe ADLs and gait.    Follow Up Recommendations  SNF     Equipment Recommendations  None recommended by PT    Recommendations for Other Services       Precautions / Restrictions Precautions Precautions: Fall Restrictions Weight Bearing Restrictions: No    Mobility  Bed Mobility Overal bed mobility: Needs Assistance Bed Mobility: Supine to Sit     Supine to sit: Min assist;Mod assist     General bed mobility comments: has difficulty propping up on elbows to use UE's for sitting up  Transfers Overall transfer level: Needs assistance Equipment used: Rolling walker (2 wheeled) Transfers: Sit to/from [facility] Sit to Stand: Min assist;Mod assist Stand pivot transfers: Min assist;Mod assist       General transfer comment: slow labored, poor tolerance for weighbearing on RLE  Ambulation/Gait Ambulation/Gait assistance: Mod assist Gait Distance (Feet): 2 Feet Assistive device: Rolling walker (2 wheeled) Gait Pattern/deviations: Decreased step length - right;Decreased stance time - left;Decreased stride length Gait velocity: slow    General Gait Details: demonstrates improvement for using RW, limited to 5-6 short steps at bedside with mostly non weightbearing on RLE, poor tolerance for touching right foot down due to increased pain   Stairs             Wheelchair Mobility    Modified Rankin (Stroke Patients Only)       Balance Overall balance assessment: Needs assistance Sitting-balance support: Feet supported;No upper extremity supported Sitting balance-Leahy Scale: Good     Standing balance support: Bilateral upper extremity supported;During functional activity Standing balance-Leahy Scale: Fair Standing balance comment: using RW, limited weightbearing on RLE                            Cognition Arousal/Alertness: Awake/alert Behavior During Therapy: WFL for tasks assessed/performed Overall Cognitive Status: Within Functional Limits for tasks assessed                                        Exercises      General Comments        Pertinent Vitals/Pain Pain Assessment: Faces Faces Pain Scale: Hurts even more Pain Location: right hip, no pain at rest Pain Descriptors / Indicators: Discomfort;Grimacing;Guarding;Sharp Pain Intervention(s): Limited activity within patient's tolerance;Monitored during session;Premedicated before session    Home Living                      Prior Function  PT Goals (current goals can now be found in the care plan section) Acute Rehab PT Goals Patient Stated Goal: return home after rehab PT Goal Formulation: With patient/family Time For Goal Achievement: 05/05/18 Progress towards PT goals: Progressing toward goals    Frequency    Min 3X/week      PT Plan Current plan remains appropriate    Co-evaluation              AM-PAC PT "6 Clicks" Daily Activity  Outcome Measure  Difficulty turning over in bed (including adjusting bedclothes, sheets and blankets)?: A Little Difficulty moving from  lying on back to sitting on the side of the bed? : A Lot Difficulty sitting down on and standing up from a chair with arms (e.g., wheelchair, bedside commode, etc,.)?: A Lot Help needed moving to and from a bed to chair (including a wheelchair)?: A Lot Help needed walking in hospital room?: A Lot Help needed climbing 3-5 steps with a railing? : Total 6 Click Score: 12    End of Session   Activity Tolerance: Patient tolerated treatment well;Patient limited by pain Patient left: in chair;with call bell/phone within reach;with family/visitor present Nurse Communication: Mobility status PT Visit Diagnosis: Unsteadiness on feet (R26.81);Other abnormalities of gait and mobility (R26.89);Muscle weakness (generalized) (M62.81)     Time: 6761-9509 PT Time Calculation (min) (ACUTE ONLY): 20 min  Charges:  $Therapeutic Activity: 8-22 mins                     2:02 PM, 04/22/18 [name], MPT Physical Therapist with [facility] [phone] office [phone] mobile phone w

## 2018-04-22 NOTE — Consult Note (Addendum)
Reason for Consult: Right thigh pain status post fall Referring Physician: J MEMON  Leonard Cisneros is an 77 y.o. male.  HPI: 77 year old [place] the beach [place], [place] 19 fell after being tangled up in his crocs.  He landed on his right side he was helped to his feet he had painful weightbearing drove home came to the emergency room with increasing pain in the right thigh not able to walk and was admitted on initial films have been negative  Pain right thigh Date of injury August 19, 2 days ago Pain is described as a dull ache Pain is 0 at rest 10 out of 10 with weightbearing Associated with decreased range of motion in the entire right leg including hip and knee as well as painful weightbearing  Past Medical History:  Diagnosis Date  . Atrial flutter ([employer])    a. s/p remote ablation by Dr. [name].  [name] CAD (coronary artery disease)    a. s/p CABG 2003. b. 01/2016: unstable angina - s/p BMS to SVG-ramus intermediate, patent LIMA-mLAD, patent RIMA-dRCA.   [name] CHF (congestive heart failure) ([place])   . Chronic atrial fibrillation (HCC)    a. initially post op CABG then chronic. On Coumadin.  . Chronic lower back pain   . Essential hypertension   . High cholesterol   . Ischemic cardiomyopathy    a. Cath 01/2016 -  EF 50-55% with mild distal inferior hypocontractility.  [name] Unspecified hereditary and idiopathic peripheral neuropathy 04/04/2014    Past Surgical History:  Procedure Laterality Date  . BACK SURGERY    . CARDIAC CATHETERIZATION  ~ 2000; 2003  . CARDIAC CATHETERIZATION N/A 02/01/2016   Procedure: Left Heart Cath and Cors/Grafts Angiography;  Surgeon: [name], MD;  Location: [place] [facility];  Service: Cardiovascular;  Laterality: N/A;  . CARDIAC CATHETERIZATION N/A 02/01/2016   Procedure: Coronary Stent Intervention;  Surgeon: [name], MD;  Location: [place] [facility];  Service: Cardiovascular;  Laterality: N/A;  . COLONOSCOPY  06/04/2002   Normal colon and  rectum  . COLONOSCOPY  07/08/2012   Procedure: COLONOSCOPY;  Surgeon: [name], MD;  [facility];  Service: Endoscopy;  Laterality: N/A;  11:30  . COLONOSCOPY N/A 10/02/2017   Procedure: COLONOSCOPY;  Surgeon: [name], MD;  [facility];  Service: Endoscopy;  Laterality: N/A;  10:30am  . CORONARY ANGIOPLASTY WITH STENT PLACEMENT  02/01/2016  . CORONARY ARTERY BYPASS GRAFT  2003   LIMA to LAD,LIMA to distal RCA,SVG to ramus intermediate vessel.  . FOREIGN BODY REMOVAL Right 04/02/2014   Procedure: FOREIGN BODY REMOVAL RIGHT FOOT;  Surgeon: [name], MD;  Location: [facility];  Service: General;  Laterality: Right;  . JOINT REPLACEMENT    . JOINT REPLACEMENT    . LUMBAR DISC SURGERY    . REVISION TOTAL HIP ARTHROPLASTY Bilateral 2005-2012   right-left  . SHOULDER OPEN ROTATOR CUFF REPAIR Right   . TOTAL HIP ARTHROPLASTY Right 1999  . TOTAL HIP ARTHROPLASTY Left 2008  . 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    Family History  Problem Relation Age of Onset  . Heart attack Brother   . Colon cancer Neg Hx     Social History:  reports that he quit smoking about 51 years ago. His  smoking use included cigarettes. He has a 1.00 pack-year smoking history. He has never used smokeless tobacco. He reports that he drinks about 11.0 standard drinks of alcohol per week. He reports that he does not use drugs.  Allergies:  Allergies  Allergen Reactions  . Augmentin [Amoxicillin-Pot Clavulanate] Nausea And Vomiting and Other (See Comments)    Has patient had a PCN reaction causing immediate rash, facial/tongue/throat swelling, SOB or lightheadedness with hypotension: Unknown Has patient had a PCN reaction causing severe rash involving mucus membranes or skin necrosis: No Has  patient had a PCN reaction that required hospitalization: No Has patient had a PCN reaction occurring within the last 10 years: No If all of the above answers are "NO", then may proceed with Cephalosporin use.     Medications: I have reviewed the patient's current medications.  Results for orders placed or performed during the hospital encounter of 04/20/18 (from the past 48 hour(s))  CBC     Status: Abnormal   Collection Time: 04/21/18 12:05 AM  Result Value Ref Range   WBC 11.0 (H) 4.0 - 10.5 K/uL   RBC 3.90 (L) 4.22 - 5.81 MIL/uL   Hemoglobin 13.7 13.0 - 17.0 g/dL   HCT 39.9 39.0 - 52.0 %   MCV 102.3 (H) 78.0 - 100.0 fL   MCH 35.1 (H) 26.0 - 34.0 pg   MCHC 34.3 30.0 - 36.0 g/dL   RDW 13.8 11.5 - 15.5 %   Platelets 170 150 - 400 K/uL    Comment: Performed at [facility], [address] [place], [place] [address]  Basic metabolic panel     Status: Abnormal   Collection Time: 04/21/18 12:05 AM  Result Value Ref Range   Sodium 137 135 - 145 mmol/L   Potassium 4.1 3.5 - 5.1 mmol/L   Chloride 104 98 - 111 mmol/L   CO2 24 22 - 32 mmol/L   Glucose, Bld 128 (H) 70 - 99 mg/dL   BUN 20 8 - 23 mg/dL   Creatinine, Ser 0.91 0.61 - 1.24 mg/dL   Calcium 9.2 8.9 - 10.3 mg/dL   GFR calc non Af Amer >60 >60 mL/min   GFR calc Af Amer >60 >60 mL/min    Comment: (NOTE) The eGFR has been calculated using the CKD EPI equation. This calculation has not been validated in all clinical situations. eGFR's persistently <60 mL/min signify possible Chronic Kidney Disease.    Anion gap 9 5 - 15    Comment: Performed at [facility], [address], [place], [place] [address]  Protime-INR     Status: Abnormal   Collection Time: 04/21/18 12:05 AM  Result Value Ref Range   Prothrombin Time 24.9 (H) 11.4 - 15.2 seconds   INR 2.28     Comment: Performed at [facility], [address] [place], [place] [address]  Type and screen [facility]     Status: None   Collection Time: 04/21/18 12:05 AM   Result Value Ref Range   ABO/RH(D) O POS    Antibody Screen NEG    Sample Expiration      04/24/2018 Performed at [facility], [address], [place], [place] [address]   Protime-INR     Status: Abnormal   Collection Time: 04/22/18  6:04 AM  Result Value Ref Range   Prothrombin Time 21.1 (H) 11.4 - 15.2 seconds   INR 1.85     Comment: Performed at [facility], [address], [place],  [address]  Ct Head Wo Contrast  Result Date: 04/21/2018 CLINICAL DATA:  Patient fell today on asphalt. No loss of consciousness. EXAM: CT HEAD WITHOUT CONTRAST TECHNIQUE: Contiguous axial images were obtained from the base of the skull through the vertex without intravenous contrast. COMPARISON:  None. FINDINGS: Brain: No evidence of acute infarction, hemorrhage, hydrocephalus, extra-axial collection or mass lesion/mass effect. Diffuse cerebral atrophy. Vascular: Moderate intracranial arterial vascular calcifications are present. Skull: Calvarium appears intact. No acute depressed skull fractures. Sinuses/Orbits: Mucosal thickening of the paranasal sinuses with opacification of multiple ethmoid air cells. No acute air-fluid levels. Mastoid air cells are clear. Other: None. IMPRESSION: No acute intracranial abnormalities. Chronic atrophy. Probable inflammatory changes in the paranasal sinuses. Electronically Signed   By: Lucienne Capers M.D.   On: 04/21/2018 00:44   Ct Hip Right Wo Contrast  Result Date: 04/21/2018 CLINICAL DATA:  Hip trauma EXAM: CT OF THE RIGHT HIP WITHOUT CONTRAST TECHNIQUE: Multidetector CT imaging of the right hip was performed according to the standard protocol. Multiplanar CT image reconstructions were also generated. COMPARISON:  Radiographs 04/20/2018, 12/06/2010, 05/03/2010 FINDINGS: Bones/Joint/Cartilage Status post right hip replacement with associated artifact. No dislocation evident. No definitive fracture lucency is seen. The right pubic rami appear intact. Amorphous  material along the inter margin of the right acetabulum, may reflect mild dystrophic calcification or postsurgical change, radiographically this appears stable. No large hip effusion. Ligaments Suboptimally assessed by CT. Muscles and Tendons No intramuscular fluid collections. Soft tissues Mild soft tissue thickening adjacent to the trochanter, likely due to postsurgical change. No hyperdense fluid collections within the soft tissues to suggest soft tissue hematoma. Dense vascular calcifications. IMPRESSION: Status post right hip replacement.  No definitive fracture is seen. Electronically Signed   By: Donavan Foil M.D.   On: 04/21/2018 01:16   Dg Knee Complete 4 Views Right  Result Date: 04/21/2018 CLINICAL DATA:  Fall EXAM: RIGHT KNEE - COMPLETE 4+ VIEW COMPARISON:  10/22/2016 FINDINGS: No fracture or malalignment. Mild patellofemoral and medial joint space degenerative change. Mild degenerative change of the lateral compartment. No significant the fusion. Vascular calcification. IMPRESSION: Degenerative changes without acute osseous abnormality. Electronically Signed   By: Donavan Foil M.D.   On: 04/21/2018 00:36   Dg Hip Unilat W Or Wo Pelvis 2-3 Views Right  Result Date: 04/20/2018 CLINICAL DATA:  Fall with history of hip replacements EXAM: DG HIP (WITH OR WITHOUT PELVIS) 2-3V RIGHT COMPARISON:  None. FINDINGS: Pubic symphysis and rami are intact. Status post left hip replacement with normal alignment. Status post right hip replacement without fracture or malalignment. IMPRESSION: Status post bilateral hip replacements without acute osseous abnormality Electronically Signed   By: Donavan Foil M.D.   On: 04/20/2018 22:23   Dg Femur 1 View Right  Result Date: 04/21/2018 CLINICAL DATA:  Fall with leg pain EXAM: RIGHT FEMUR 1 VIEW COMPARISON:  None. FINDINGS: Status post right hip replacement with normal alignment. No fracture or dislocation. Vascular calcification IMPRESSION: No acute osseous  abnormality Electronically Signed   By: Donavan Foil M.D.   On: 04/21/2018 00:37    Review of Systems  Constitutional: Negative for chills and fever.  Skin: Negative.   Neurological: Negative for tingling, sensory change, focal weakness and weakness.   Blood pressure 113/69, pulse 79, temperature 98.1 F (36.7 C), temperature source Oral, resp. rate 18, height '5\' 11"'$  (1.803 m), weight 104.3 kg, SpO2 95 %. Physical Exam  General appearance well-developed well-nourished mesomorphic body habitus Mood and affect are normal and pleasant  He is awake alert and oriented x3 Right leg sensation is normal no pathologic reflexes Right leg pulse and perfusion normal no edema  Skin right leg normal Tenderness right thigh without swelling Muscle tone normal no tremor Knee and hip are stable Painful range of motion and had to be stopped because of pain  Skin left leg normal No tenderness with normal alignment Full range of motion Normal muscle tone and strength No instability or subluxation knee hip or ankle  Assessment/Plan: Right thigh pain status post fall Patient with history of total hip implant Initial films negative including CAT scan, treat for pain, mobilize as tolerated, DVT prevention.  Follow clinically.  A separate note will be included for the images for independent interpretation  Arther Abbott 04/22/2018, 2:14 PM

## 2018-04-22 NOTE — Progress Notes (Signed)
PROGRESS NOTE    Leonard Cisneros  CHY:[id number] DOB: [birth year] DOA: 04/20/2018 PCP: [name], MD    Brief Narrative:  77 year old male admitted to the hospital with right-sided leg pain after suffering a fall.  He has a history of prior hip replacement.  Imaging of his right hip/leg has been unrevealing, but he does continue to have significant right leg pain.  Orthopedic surgery is following.   Assessment & Plan:   Principal Problem:   Fall Active Problems:   Chronic atrial fibrillation (HCC)   Obesity (BMI 30-39.9)   Essential hypertension   Neuropathic pain   Hyperlipidemia LDL goal <70   CAD (coronary artery disease) of artery bypass graft   PAD (peripheral artery disease) ([place])   1. Fall with right leg pain.  Patient has had extensive imaging done including x-rays and CT of his right hip.  He does not appear to have any new fractures.  Orthopedics is following.  He underwent MRI of his lumbar spine to see if there is any referred pain.  He does have some degenerative changes on MRI, but no acute findings.  Etiology of pain is not entirely clear.  Will check CK.  Continue observation. 2. Chronic atrial fibrillation.  Anticoagulated with Coumadin.  Heart rate is stable.  Continue diltiazem and metoprolol 3. Hypertension.  Continue home medications.  Stable. 4. CAD status post CABG.  Continue aspirin, metoprolol and Imdur. 5. Chronic back pain.  Noted to have degenerative changes in the lumbar spine, but this does not appear to be the cause of his leg pain.   DVT prophylaxis: Coumadin Code Status: Full code Family Communication: No family present Disposition Plan: May need nursing facility placement   Consultants:   Orthopedics  Procedures:     Antimicrobials:      Subjective: Reports that pain is controlled when he is not moving his right leg.  Once he starts moving his right leg, he has significant pain.  Describes the pain as starting above his  knee and radiating up his thigh.  Objective: Vitals:   04/21/18 2053 04/21/18 2150 04/22/18 0559 04/22/18 1446  BP: 105/67  113/69 101/67  Pulse: 77  79 93  Resp: 18  18 20   Temp: 98.2 F (36.8 C)  98.1 F (36.7 C) 98.1 F (36.7 C)  TempSrc: Oral  Oral Oral  SpO2: 95% 97% 95% 97%  Weight:      Height:        Intake/Output Summary (Last 24 hours) at 04/22/2018 1827 Last data filed at 04/22/2018 1006 Gross per 24 hour  Intake 6 ml  Output 1400 ml  Net -1394 ml   Filed Weights   04/20/18 2122 04/21/18 1415  Weight: 104.3 kg 104.3 kg    Examination:  General exam: Appears calm and comfortable  Respiratory system: Clear to auscultation. Respiratory effort normal. Cardiovascular system: S1 & S2 heard, RRR. No JVD, murmurs, rubs, gallops or clicks. No pedal edema. Gastrointestinal system: Abdomen is nondistended, soft and nontender. No organomegaly or masses felt. Normal bowel sounds heard. Central nervous system: Alert and oriented. No focal neurological deficits. Extremities: Significant tenderness to palpation over right thigh.  Increase pain with range of motion Skin: No rashes, lesions or ulcers Psychiatry: Judgement and insight appear normal. Mood & affect appropriate.     Data Reviewed: I have personally reviewed following labs and imaging studies  CBC: Recent Labs  Lab 04/21/18 0005  WBC 11.0*  HGB 13.7  HCT 39.9  MCV 102.3*  PLT 323   Basic Metabolic Panel: Recent Labs  Lab 04/21/18 0005  NA 137  K 4.1  CL 104  CO2 24  GLUCOSE 128*  BUN 20  CREATININE 0.91  CALCIUM 9.2   GFR: Estimated Creatinine Clearance: 84.9 mL/min (by C-G formula based on SCr of 0.91 mg/dL). Liver Function Tests: No results for input(s): AST, ALT, ALKPHOS, BILITOT, PROT, ALBUMIN in the last 168 hours. No results for input(s): LIPASE, AMYLASE in the last 168 hours. No results for input(s): AMMONIA in the last 168 hours. Coagulation Profile: Recent Labs  Lab  04/21/18 0005 04/22/18 0604  INR 2.28 1.85   Cardiac Enzymes: No results for input(s): CKTOTAL, CKMB, CKMBINDEX, TROPONINI in the last 168 hours. BNP (last 3 results) No results for input(s): PROBNP in the last 8760 hours. HbA1C: No results for input(s): HGBA1C in the last 72 hours. CBG: No results for input(s): GLUCAP in the last 168 hours. Lipid Profile: No results for input(s): CHOL, HDL, LDLCALC, TRIG, CHOLHDL, LDLDIRECT in the last 72 hours. Thyroid Function Tests: No results for input(s): TSH, T4TOTAL, FREET4, T3FREE, THYROIDAB in the last 72 hours. Anemia Panel: No results for input(s): VITAMINB12, FOLATE, FERRITIN, TIBC, IRON, RETICCTPCT in the last 72 hours. Sepsis Labs: No results for input(s): PROCALCITON, LATICACIDVEN in the last 168 hours.  No results found for this or any previous visit (from the past 240 hour(s)).       Radiology Studies: Ct Head Wo Contrast  Result Date: 04/21/2018 CLINICAL DATA:  Patient fell today on asphalt. No loss of consciousness. EXAM: CT HEAD WITHOUT CONTRAST TECHNIQUE: Contiguous axial images were obtained from the base of the skull through the vertex without intravenous contrast. COMPARISON:  None. FINDINGS: Brain: No evidence of acute infarction, hemorrhage, hydrocephalus, extra-axial collection or mass lesion/mass effect. Diffuse cerebral atrophy. Vascular: Moderate intracranial arterial vascular calcifications are present. Skull: Calvarium appears intact. No acute depressed skull fractures. Sinuses/Orbits: Mucosal thickening of the paranasal sinuses with opacification of multiple ethmoid air cells. No acute air-fluid levels. Mastoid air cells are clear. Other: None. IMPRESSION: No acute intracranial abnormalities. Chronic atrophy. Probable inflammatory changes in the paranasal sinuses. Electronically Signed   By: Lucienne Capers M.D.   On: 04/21/2018 00:44   Mr Lumbar Spine Wo Contrast  Result Date: 04/22/2018 CLINICAL DATA:  Back and  right thigh pain after fall 2 days ago. EXAM: MRI LUMBAR SPINE WITHOUT CONTRAST TECHNIQUE: Multiplanar, multisequence MR imaging of the lumbar spine was performed. No intravenous contrast was administered. COMPARISON:  Lumbar spine x-rays dated April 07, 2018. FINDINGS: Segmentation:  Standard. Alignment: Straightening of the normal lumbar lordosis. Trace retrolisthesis at L3-L4. Vertebrae: No fracture, evidence of discitis, or bone lesion. Scattered degenerative endplate marrow changes. Conus medullaris and cauda equina: Conus extends to the L1 level. Conus and cauda equina appear normal. Paraspinal and other soft tissues: Right greater than left paraspinous muscle atrophy. Disc levels: T12-L1:  Circumferential disc osteophyte complex.  No stenosis. L1-L2: Disc bulging and endplate spurring. Small right foraminal and extraforaminal disc protrusion encroaching on the right L1 nerve root. Mild spinal canal stenosis. No neuroforaminal stenosis. L2-L3: Large circumferential disc osteophyte complex resulting in moderate central spinal canal stenosis. No neuroforaminal stenosis. L3-L4: Circumferential disc osteophyte complex and mild-to-moderate bilateral facet arthropathy mild central spinal canal stenosis and moderate right lateral recess stenosis potentially affecting the descending right L4 nerve root. Mild bilateral neuroforaminal stenosis. L4-L5: Circumferential disc osteophyte complex and mild bilateral facet arthropathy. Mild bilateral neuroforaminal stenosis.  No spinal canal stenosis. L5-S1: Diffuse disc bulge with large right foraminal and extraforaminal disc osteophyte complex potentially affecting the exiting right L5 nerve root. Tiny superimposed central disc protrusion. Mild bilateral facet arthropathy. Moderate bilateral neuroforaminal stenosis. No spinal canal stenosis. IMPRESSION: 1.  No acute osseous abnormality. 2. 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  3. Moderate spinal canal stenosis at L2-L3. 4. Large right foraminal and extraforaminal disc osteophyte complex at L5-S1 potentially affecting the exiting right L5 nerve root. Moderate bilateral neuroforaminal stenosis at this level. Electronically Signed   By: Titus Dubin M.D.   On: 04/22/2018 17:17   Ct Hip Right Wo Contrast  Result Date: 04/21/2018 CLINICAL DATA:  Hip trauma EXAM: CT OF THE RIGHT HIP WITHOUT CONTRAST TECHNIQUE: Multidetector CT imaging of the right hip was performed according to the standard protocol. Multiplanar CT image reconstructions were also generated. COMPARISON:  Radiographs 04/20/2018, 12/06/2010, 05/03/2010 FINDINGS: Bones/Joint/Cartilage Status post right hip replacement with associated artifact. No dislocation evident. No definitive fracture lucency is seen. The right pubic rami appear intact. Amorphous material along the inter margin of the right acetabulum, may reflect mild dystrophic calcification or postsurgical change, radiographically this appears stable. No large hip effusion. Ligaments Suboptimally assessed by CT. Muscles and Tendons No intramuscular fluid collections. Soft tissues Mild soft tissue thickening adjacent to the trochanter, likely due to postsurgical change. No hyperdense fluid collections within the soft tissues to suggest soft tissue hematoma. Dense vascular calcifications. IMPRESSION: Status post right hip replacement.  No definitive fracture is seen. Electronically Signed   By: Donavan Foil M.D.   On: 04/21/2018 01:16   Dg Knee Complete 4 Views Right  Result Date: 04/21/2018 CLINICAL DATA:  Fall EXAM: RIGHT KNEE - COMPLETE 4+ VIEW COMPARISON:  10/22/2016 FINDINGS: No fracture or malalignment. Mild patellofemoral and medial joint space degenerative change. Mild degenerative change of the lateral compartment. No significant the fusion. Vascular calcification. IMPRESSION: Degenerative changes without acute osseous abnormality. Electronically Signed    By: Donavan Foil M.D.   On: 04/21/2018 00:36   Dg Hip Unilat W Or Wo Pelvis 2-3 Views Right  Result Date: 04/20/2018 CLINICAL DATA:  Fall with history of hip replacements EXAM: DG HIP (WITH OR WITHOUT PELVIS) 2-3V RIGHT COMPARISON:  None. FINDINGS: Pubic symphysis and rami are intact. Status post left hip replacement with normal alignment. Status post right hip replacement without fracture or malalignment. IMPRESSION: Status post bilateral hip replacements without acute osseous abnormality Electronically Signed   By: Donavan Foil M.D.   On: 04/20/2018 22:23   Dg Femur 1 View Right  Result Date: 04/21/2018 CLINICAL DATA:  Fall with leg pain EXAM: RIGHT FEMUR 1 VIEW COMPARISON:  None. FINDINGS: Status post right hip replacement with normal alignment. No fracture or dislocation. Vascular calcification IMPRESSION: No acute osseous abnormality Electronically Signed   By: Donavan Foil M.D.   On: 04/21/2018 00:37        Scheduled Meds: . aspirin EC  81 mg Oral Daily  . diltiazem  240 mg Oral Daily  . hydrochlorothiazide  12.5 mg Oral Daily  . isosorbide mononitrate  30 mg Oral Daily  . lidocaine  1 patch Transdermal Q24H  . metoprolol succinate  75 mg Oral Daily  . ramipril  2.5 mg Oral Daily  . sodium chloride flush  3 mL Intravenous Q12H  . Warfarin - Pharmacist Dosing Inpatient   Does not apply q1800   Continuous Infusions: . sodium chloride  LOS: 0 days    Time spent: 62mins    Kathie Dike, MD [facility] Pager [phone]  If 7PM-7AM, please contact night-coverage [email or web address] Password [facility] 04/22/2018, 6:27 PM

## 2018-04-22 NOTE — Progress Notes (Signed)
[place] for warfarin Indication: atrial fibrillation  Allergies  Allergen Reactions  . Augmentin [Amoxicillin-Pot Clavulanate] Nausea And Vomiting and Other (See Comments)    Has patient had a PCN reaction causing immediate rash, facial/tongue/throat swelling, SOB or lightheadedness with hypotension: Unknown Has patient had a PCN reaction causing severe rash involving mucus membranes or skin necrosis: No Has patient had a PCN reaction that required hospitalization: No Has patient had a PCN reaction occurring within the last 10 years: No If all of the above answers are "NO", then may proceed with Cephalosporin use.     Patient Measurements: Height: 5\' 11"  (180.3 cm) Weight: 230 lb (104.3 kg) IBW/kg (Calculated) : 75.3   Vital Signs: Temp: 98.1 F (36.7 C) (08/20 0559) Temp Source: Oral (08/20 0559) BP: 113/69 (08/20 0559) Pulse Rate: 79 (08/20 0559)  Labs: Recent Labs    04/21/18 0005 04/22/18 0604  HGB 13.7  --   HCT 39.9  --   PLT 170  --   LABPROT 24.9* 21.1*  INR 2.28 1.85  CREATININE 0.91  --     Estimated Creatinine Clearance: 84.9 mL/min (by C-G formula based on SCr of 0.91 mg/dL).   Medical History: Past Medical History:  Diagnosis Date  . Atrial flutter ([place])    a. s/p remote ablation by Dr. [name].  [name] CAD (coronary artery disease)    a. s/p CABG 2003. b. 01/2016: unstable angina - s/p BMS to SVG-ramus intermediate, patent LIMA-mLAD, patent RIMA-dRCA.   [name] CHF (congestive heart failure) ([place])   . Chronic atrial fibrillation (HCC)    a. initially post op CABG then chronic. On Coumadin.  . Chronic lower back pain   . Essential hypertension   . High cholesterol   . Ischemic cardiomyopathy    a. Cath 01/2016 -  EF 50-55% with mild distal inferior hypocontractility.  [name] Unspecified hereditary and idiopathic peripheral neuropathy 04/04/2014    Medications:  Medications Prior to Admission  Medication Sig Dispense  Refill Last Dose  . acetaminophen (TYLENOL) 500 MG tablet Take 1,000 mg by mouth every 6 (six) hours as needed for moderate pain or headache.   Taking  . aspirin EC 81 MG tablet Take 1 tablet (81 mg total) by mouth daily. 90 tablet 3 04/20/2018 at 730  . diltiazem (DILACOR XR) 240 MG 24 hr capsule Take 1 capsule (240 mg total) by mouth daily. KEEP OV. 90 capsule 3 04/20/2018 at Unknown time  . hydrochlorothiazide (MICROZIDE) 12.5 MG capsule Take 1 capsule (12.5 mg total) by mouth daily. 90 capsule 1 04/20/2018 at Unknown time  . HYDROcodone-acetaminophen (NORCO/VICODIN) 5-325 MG tablet Take 1 tablet by mouth every 6 (six) hours as needed for moderate pain. 30 tablet 0 04/20/2018 at Unknown time  . isosorbide mononitrate (IMDUR) 30 MG 24 hr tablet TAKE 1 TABLET BY MOUTH EVERY DAY 30 tablet 9 04/20/2018 at Unknown time  . metoprolol succinate (TOPROL-XL) 50 MG 24 hr tablet TAKE 1 & 1/2 TABLETS BY MOUTH EVERY DAY 90 tablet 2 04/20/2018 at 730  . nitroGLYCERIN (NITROSTAT) 0.4 MG SL tablet DISSOLVE 1 TABLET UNDER THE TONGUE EVERY 5 MINUTES UP TO 15 MINUTES FOR CHEST PAIN 25 tablet 0 Taking  . ramipril (ALTACE) 2.5 MG capsule TAKE ONE CAPSULE BY MOUTH EVERY DAY (Patient taking differently: TAKE 2.5 MG BY MOUTH EVERY DAY) 90 capsule 2 04/20/2018 at Unknown time  . warfarin (COUMADIN) 7.5 MG tablet TAKE 1/2 TO 1 TABLET BY MOUTH DAILY AS DIRECTED (Patient  taking differently: Take 3.75-7.5 mg by mouth See admin instructions. Take 3.75 mg by mouth daily on Monday and Friday. Take 7.5 mg by mouth daily on all other days) 30 tablet 3 04/20/2018 at 730  . warfarin (COUMADIN) 7.5 MG tablet Take 1/2 to 1 tablet by mouth daily as directed. 90 tablet 0 Taking  . predniSONE (DELTASONE) 20 MG tablet Take 3 tablets for 3 days, then take 2 tablets for 3 days, then take one tablet for 3 days. (Patient not taking: Reported on 04/21/2018) 18 tablet 0 Completed Course at Unknown time    Assessment: Pharmacy consulted to dose warfarin  in patient with history of atrial fibrillation.  [name] INR  is subtherapeutic at 1.85. [name] home dose of warfarin is 3.75 mg MF and 7.5 mg ROW.  Goal of Therapy:  INR 2-3 Monitor platelets by anticoagulation protocol: Yes   Plan:  Warfarin 7.5 mg x 1 dose Monitor daily INR and s/s of bleeding  Revonda Standard Merryn Thaker 04/22/2018,9:15 AM

## 2018-04-22 NOTE — Progress Notes (Signed)
Patient ID: Leonard Cisneros, male   DOB: Apr 04, 1941, 77 y.o.   MRN: [id number] Consult addendum  AP pelvis right hip 2 views Patient has 2 total hip prostheses in place.  On the right side the cup has acetabular screws the stem is in slight varus but there is excellent spot welding of the lateral cortex indicating no acute loosening there is no fracture noted  4 views right knee mild OA is noted on the medial side no fracture dislocation is noted  2 views right femur no evidence of fracture  CT scan of the hip with particular attention plate to the right side shows no evidence of fracture dislocation loosening or poor cup positioning  Impression normal position of right hip prosthesis, noted left hip prosthesis also stable  Mild OA medial compartment right knee  No evidence of fracture right femur  CT scan negative for acute process.

## 2018-04-23 DIAGNOSIS — I10 Essential (primary) hypertension: Secondary | ICD-10-CM | POA: Diagnosis not present

## 2018-04-23 DIAGNOSIS — I482 Chronic atrial fibrillation: Secondary | ICD-10-CM | POA: Diagnosis not present

## 2018-04-23 DIAGNOSIS — S72114A Nondisplaced fracture of greater trochanter of right femur, initial encounter for closed fracture: Secondary | ICD-10-CM | POA: Diagnosis not present

## 2018-04-23 DIAGNOSIS — E785 Hyperlipidemia, unspecified: Secondary | ICD-10-CM | POA: Diagnosis not present

## 2018-04-23 DIAGNOSIS — M25551 Pain in right hip: Secondary | ICD-10-CM | POA: Diagnosis not present

## 2018-04-23 DIAGNOSIS — W19XXXA Unspecified fall, initial encounter: Secondary | ICD-10-CM | POA: Diagnosis not present

## 2018-04-23 DIAGNOSIS — I25700 Atherosclerosis of coronary artery bypass graft(s), unspecified, with unstable angina pectoris: Secondary | ICD-10-CM | POA: Diagnosis not present

## 2018-04-23 LAB — CBC
HCT: 38.5 % — ABNORMAL LOW (ref 39.0–52.0)
HEMOGLOBIN: 13 g/dL (ref 13.0–17.0)
MCH: 34.5 pg — AB (ref 26.0–34.0)
MCHC: 33.8 g/dL (ref 30.0–36.0)
MCV: 102.1 fL — ABNORMAL HIGH (ref 78.0–100.0)
PLATELETS: 153 10*3/uL (ref 150–400)
RBC: 3.77 MIL/uL — ABNORMAL LOW (ref 4.22–5.81)
RDW: 13.7 % (ref 11.5–15.5)
WBC: 8.7 10*3/uL (ref 4.0–10.5)

## 2018-04-23 LAB — CK: Total CK: 89 U/L (ref 49–397)

## 2018-04-23 LAB — BASIC METABOLIC PANEL
Anion gap: 10 (ref 5–15)
BUN: 19 mg/dL (ref 8–23)
CALCIUM: 9.1 mg/dL (ref 8.9–10.3)
CO2: 25 mmol/L (ref 22–32)
CREATININE: 0.79 mg/dL (ref 0.61–1.24)
Chloride: 101 mmol/L (ref 98–111)
GFR calc Af Amer: >60 mL/min (ref >60)
GLUCOSE: 112 mg/dL — AB (ref 70–99)
Potassium: 3.9 mmol/L (ref 3.5–5.1)
Sodium: 136 mmol/L (ref 135–145)

## 2018-04-23 LAB — PROTIME-INR
INR: 1.76
PROTHROMBIN TIME: 20.3 s — AB (ref 11.4–15.2)

## 2018-04-23 MED ORDER — METHOCARBAMOL 500 MG PO TABS
500.0000 mg | ORAL_TABLET | Freq: Four times a day (QID) | ORAL | Status: DC
  Administered 2018-04-23 – 2018-04-28 (×19): 500 mg via ORAL
  Filled 2018-04-23 (×19): qty 1

## 2018-04-23 MED ORDER — WARFARIN SODIUM 7.5 MG PO TABS
7.5000 mg | ORAL_TABLET | Freq: Once | ORAL | Status: AC
  Administered 2018-04-23: 7.5 mg via ORAL
  Filled 2018-04-23: qty 1

## 2018-04-23 NOTE — Plan of Care (Signed)
Patient still unable to tolerate movement to right hip. Will continue to monitor.

## 2018-04-23 NOTE — Progress Notes (Signed)
PROGRESS NOTE    Leonard Cisneros  ERX:[id number] DOB: May 20, 1941 DOA: 04/20/2018 PCP: [name], MD    Brief Narrative:  77 year old male admitted to the hospital with right-sided leg pain after suffering a fall.  He has a history of prior hip replacement.  Imaging of his right hip/leg has been unrevealing, but he does continue to have significant right leg pain.  Orthopedic surgery is following.   Assessment & Plan:   Principal Problem:   Fall Active Problems:   Chronic atrial fibrillation (HCC)   Obesity (BMI 30-39.9)   Essential hypertension   Neuropathic pain   Hyperlipidemia LDL goal <70   CAD (coronary artery disease) of artery bypass graft   PAD (peripheral artery disease) ([place])   1. Fall with right leg pain.  Patient has had extensive imaging done including x-rays and CT of his right hip.  He does not appear to have any new fractures.  Orthopedics is following.  He underwent MRI of his lumbar spine to see if there is any referred pain.  He does have some degenerative changes on MRI, but no acute findings.  CK level normal.  Per orthopedics, likely has symptomatic thigh contusion.  Continue supportive treatment.  We will add Robaxin.  Continue physical therapy. 2. Chronic atrial fibrillation.  Anticoagulated with Coumadin.  Heart rate is stable.  Continue diltiazem and metoprolol 3. Hypertension.  Continue home medications.  Stable. 4. CAD status post CABG.  Continue aspirin, metoprolol and Imdur. 5. Chronic back pain.  Noted to have degenerative changes in the lumbar spine, but this does not appear to be the cause of his leg pain.   DVT prophylaxis: Coumadin Code Status: Full code Family Communication: No family present Disposition Plan: May need nursing facility placement   Consultants:   Orthopedics  Procedures:     Antimicrobials:      Subjective: Continues to complain of pain in his right leg which is worse with movement.  Feels that it may be  a little better than yesterday.  Objective: Vitals:   04/22/18 1446 04/22/18 2230 04/23/18 0518 04/23/18 1439  BP: 101/67 100/64 112/79 (!) 89/64  Pulse: 93 63 77 61  Resp: 20 16  20   Temp: 98.1 F (36.7 C) 98.2 F (36.8 C) 98.2 F (36.8 C) 98.4 F (36.9 C)  TempSrc: Oral Oral Oral Oral  SpO2: 97% 96% 96% 93%  Weight:      Height:        Intake/Output Summary (Last 24 hours) at 04/23/2018 1800 Last data filed at 04/23/2018 1300 Gross per 24 hour  Intake 483 ml  Output 1550 ml  Net -1067 ml   Filed Weights   04/20/18 2122 04/21/18 1415  Weight: 104.3 kg 104.3 kg    Examination:  General exam: Alert, awake, oriented x 3 Respiratory system: Clear to auscultation. Respiratory effort normal. Cardiovascular system:RRR. No murmurs, rubs, gallops. Gastrointestinal system: Abdomen is nondistended, soft and nontender. No organomegaly or masses felt. Normal bowel sounds heard. Central nervous system: Alert and oriented. No focal neurological deficits. Extremities: Still has tenderness to right thigh and increased pain on movement of right leg Skin: No rashes, lesions or ulcers Psychiatry: Judgement and insight appear normal. Mood & affect appropriate.    Data Reviewed: I have personally reviewed following labs and imaging studies  CBC: Recent Labs  Lab 04/21/18 0005 04/23/18 0544  WBC 11.0* 8.7  HGB 13.7 13.0  HCT 39.9 38.5*  MCV 102.3* 102.1*  PLT 170 153  Basic Metabolic Panel: Recent Labs  Lab 04/21/18 0005 04/23/18 0544  NA 137 136  K 4.1 3.9  CL 104 101  CO2 24 25  GLUCOSE 128* 112*  BUN 20 19  CREATININE 0.91 0.79  CALCIUM 9.2 9.1   GFR: Estimated Creatinine Clearance: 96.6 mL/min (by C-G formula based on SCr of 0.79 mg/dL). Liver Function Tests: No results for input(s): AST, ALT, ALKPHOS, BILITOT, PROT, ALBUMIN in the last 168 hours. No results for input(s): LIPASE, AMYLASE in the last 168 hours. No results for input(s): AMMONIA in the last 168  hours. Coagulation Profile: Recent Labs  Lab 04/21/18 0005 04/22/18 0604 04/23/18 0544  INR 2.28 1.85 1.76   Cardiac Enzymes: Recent Labs  Lab 04/23/18 0544  CKTOTAL 89   BNP (last 3 results) No results for input(s): PROBNP in the last 8760 hours. HbA1C: No results for input(s): HGBA1C in the last 72 hours. CBG: No results for input(s): GLUCAP in the last 168 hours. Lipid Profile: No results for input(s): CHOL, HDL, LDLCALC, TRIG, CHOLHDL, LDLDIRECT in the last 72 hours. Thyroid Function Tests: No results for input(s): TSH, T4TOTAL, FREET4, T3FREE, THYROIDAB in the last 72 hours. Anemia Panel: No results for input(s): VITAMINB12, FOLATE, FERRITIN, TIBC, IRON, RETICCTPCT in the last 72 hours. Sepsis Labs: No results for input(s): PROCALCITON, LATICACIDVEN in the last 168 hours.  No results found for this or any previous visit (from the past 240 hour(s)).       Radiology Studies: Mr Lumbar Spine Wo Contrast  Result Date: 04/22/2018 CLINICAL DATA:  Back and right thigh pain after fall 2 days ago. EXAM: MRI LUMBAR SPINE WITHOUT CONTRAST TECHNIQUE: Multiplanar, multisequence MR imaging of the lumbar spine was performed. No intravenous contrast was administered. COMPARISON:  Lumbar spine x-rays dated April 07, 2018. FINDINGS: Segmentation:  Standard. Alignment: Straightening of the normal lumbar lordosis. Trace retrolisthesis at L3-L4. Vertebrae: No fracture, evidence of discitis, or bone lesion. Scattered degenerative endplate marrow changes. Conus medullaris and cauda equina: Conus extends to the L1 level. Conus and cauda equina appear normal. Paraspinal and other soft tissues: Right greater than left paraspinous muscle atrophy. Disc levels: T12-L1:  Circumferential disc osteophyte complex.  No stenosis. L1-L2: Disc bulging and endplate spurring. Small right foraminal and extraforaminal disc protrusion encroaching on the right L1 nerve root. Mild spinal canal stenosis. No  neuroforaminal stenosis. L2-L3: Large circumferential disc osteophyte complex resulting in moderate central spinal canal stenosis. No neuroforaminal stenosis. L3-L4: Circumferential disc osteophyte complex and mild-to-moderate bilateral facet arthropathy mild central spinal canal stenosis and moderate right lateral recess stenosis potentially affecting the descending right L4 nerve root. Mild bilateral neuroforaminal stenosis. L4-L5: Circumferential disc osteophyte complex and mild bilateral facet arthropathy. Mild bilateral neuroforaminal stenosis. No spinal canal stenosis. L5-S1: Diffuse disc bulge with large right foraminal and extraforaminal disc osteophyte complex potentially affecting the exiting right L5 nerve root. Tiny superimposed central disc protrusion. Mild bilateral facet arthropathy. Moderate bilateral neuroforaminal stenosis. No spinal canal stenosis. IMPRESSION: 1.  No acute osseous abnormality. 2. 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 3. Moderate spinal canal stenosis at L2-L3. 4. Large right foraminal and extraforaminal disc osteophyte complex at L5-S1 potentially affecting the exiting right L5 nerve root. Moderate bilateral neuroforaminal stenosis at this level. Electronically Signed   By: Titus Dubin M.D.   On: 04/22/2018 17:17        Scheduled Meds: . aspirin EC  81 mg Oral Daily  . diltiazem  240 mg Oral  Daily  . hydrochlorothiazide  12.5 mg Oral Daily  . isosorbide mononitrate  30 mg Oral Daily  . lidocaine  1 patch Transdermal Q24H  . methocarbamol  500 mg Oral QID  . metoprolol succinate  75 mg Oral Daily  . ramipril  2.5 mg Oral Daily  . sodium chloride flush  3 mL Intravenous Q12H  . Warfarin - Pharmacist Dosing Inpatient   Does not apply q1800   Continuous Infusions: . sodium chloride       LOS: 0 days    Time spent: 71mins    Kathie Dike, MD [facility] Pager [phone]  If 7PM-7AM,  please contact night-coverage [email or web address] Password [facility] 04/23/2018, 6:00 PM

## 2018-04-23 NOTE — Clinical Social Work Note (Signed)
LCSW following. Informed by Marianna Fuss at [facility] that [place] denied pt's SNF rehab at this point stating that they feel he is still acute care and not ready for rehab. Discussed with MD who agrees. Will have to restart referral to [facility] and/or other SNF's when pt closer to medical stability for dc. Holli Humbles will have to be re-submitted by SNF at that time. Met with pt and his wife to inform of above. Will follow.

## 2018-04-23 NOTE — Progress Notes (Signed)
Patient ID: Leonard Cisneros, male   DOB: 09-29-1940, 77 y.o.   MRN: [id number] Consult follow-up  Right thigh pain improving  MRI scan negative  Symptomatic treatment for thigh contusion no evidence of acute disc process

## 2018-04-23 NOTE — Progress Notes (Signed)
[facility] for warfarin Indication: atrial fibrillation  Allergies  Allergen Reactions  . Augmentin [Amoxicillin-Pot Clavulanate] Nausea And Vomiting and Other (See Comments)    Has patient had a PCN reaction causing immediate rash, facial/tongue/throat swelling, SOB or lightheadedness with hypotension: Unknown Has patient had a PCN reaction causing severe rash involving mucus membranes or skin necrosis: No Has patient had a PCN reaction that required hospitalization: No Has patient had a PCN reaction occurring within the last 10 years: No If all of the above answers are "NO", then may proceed with Cephalosporin use.     Patient Measurements: Height: 5\' 11"  (180.3 cm) Weight: 230 lb (104.3 kg) IBW/kg (Calculated) : 75.3   Vital Signs: Temp: 98.2 F (36.8 C) (08/21 0518) Temp Source: Oral (08/21 0518) BP: 112/79 (08/21 0518) Pulse Rate: 77 (08/21 0518)  Labs: Recent Labs    04/21/18 0005 04/22/18 0604 04/23/18 0544  HGB 13.7  --  13.0  HCT 39.9  --  38.5*  PLT 170  --  153  LABPROT 24.9* 21.1* 20.3*  INR 2.28 1.85 1.76  CREATININE 0.91  --  0.79  CKTOTAL  --   --  89    Estimated Creatinine Clearance: 96.6 mL/min (by C-G formula based on SCr of 0.79 mg/dL).   Medical History: Past Medical History:  Diagnosis Date  . Atrial flutter ([place])    a. s/p remote ablation by Dr. [name].  [name] CAD (coronary artery disease)    a. s/p CABG 2003. b. 01/2016: unstable angina - s/p BMS to SVG-ramus intermediate, patent LIMA-mLAD, patent RIMA-dRCA.   [name] CHF (congestive heart failure) ([place])   . Chronic atrial fibrillation (HCC)    a. initially post op CABG then chronic. On Coumadin.  . Chronic lower back pain   . Essential hypertension   . High cholesterol   . Ischemic cardiomyopathy    a. Cath 01/2016 -  EF 50-55% with mild distal inferior hypocontractility.  [name] Unspecified hereditary and idiopathic peripheral neuropathy 04/04/2014     Medications:  Medications Prior to Admission  Medication Sig Dispense Refill Last Dose  . acetaminophen (TYLENOL) 500 MG tablet Take 1,000 mg by mouth every 6 (six) hours as needed for moderate pain or headache.   Taking  . aspirin EC 81 MG tablet Take 1 tablet (81 mg total) by mouth daily. 90 tablet 3 04/20/2018 at 730  . diltiazem (DILACOR XR) 240 MG 24 hr capsule Take 1 capsule (240 mg total) by mouth daily. KEEP OV. 90 capsule 3 04/20/2018 at Unknown time  . hydrochlorothiazide (MICROZIDE) 12.5 MG capsule Take 1 capsule (12.5 mg total) by mouth daily. 90 capsule 1 04/20/2018 at Unknown time  . HYDROcodone-acetaminophen (NORCO/VICODIN) 5-325 MG tablet Take 1 tablet by mouth every 6 (six) hours as needed for moderate pain. 30 tablet 0 04/20/2018 at Unknown time  . isosorbide mononitrate (IMDUR) 30 MG 24 hr tablet TAKE 1 TABLET BY MOUTH EVERY DAY 30 tablet 9 04/20/2018 at Unknown time  . metoprolol succinate (TOPROL-XL) 50 MG 24 hr tablet TAKE 1 & 1/2 TABLETS BY MOUTH EVERY DAY 90 tablet 2 04/20/2018 at 730  . nitroGLYCERIN (NITROSTAT) 0.4 MG SL tablet DISSOLVE 1 TABLET UNDER THE TONGUE EVERY 5 MINUTES UP TO 15 MINUTES FOR CHEST PAIN 25 tablet 0 Taking  . ramipril (ALTACE) 2.5 MG capsule TAKE ONE CAPSULE BY MOUTH EVERY DAY (Patient taking differently: TAKE 2.5 MG BY MOUTH EVERY DAY) 90 capsule 2 04/20/2018 at Unknown time  .  warfarin (COUMADIN) 7.5 MG tablet TAKE 1/2 TO 1 TABLET BY MOUTH DAILY AS DIRECTED (Patient taking differently: Take 3.75-7.5 mg by mouth See admin instructions. Take 3.75 mg by mouth daily on Monday and Friday. Take 7.5 mg by mouth daily on all other days) 30 tablet 3 04/20/2018 at 730  . warfarin (COUMADIN) 7.5 MG tablet Take 1/2 to 1 tablet by mouth daily as directed. 90 tablet 0 Taking  . predniSONE (DELTASONE) 20 MG tablet Take 3 tablets for 3 days, then take 2 tablets for 3 days, then take one tablet for 3 days. (Patient not taking: Reported on 04/21/2018) 18 tablet 0 Completed  Course at Unknown time    Assessment: Pharmacy consulted to dose warfarin in patient with history of atrial fibrillation.  [name] INR  is subtherapeutic at 1.76. [name] home dose of warfarin is 3.75 mg MF and 7.5 mg ROW.  Goal of Therapy:  INR 2-3 Monitor platelets by anticoagulation protocol: Yes   Plan:  Warfarin 7.5 mg x 1 dose Monitor daily INR and s/s of bleeding  Ramond Craver 04/23/2018,8:16 AM

## 2018-04-24 ENCOUNTER — Inpatient Hospital Stay (HOSPITAL_COMMUNITY): Payer: Medicare HMO

## 2018-04-24 DIAGNOSIS — R5381 Other malaise: Secondary | ICD-10-CM | POA: Diagnosis not present

## 2018-04-24 DIAGNOSIS — Z951 Presence of aortocoronary bypass graft: Secondary | ICD-10-CM | POA: Diagnosis not present

## 2018-04-24 DIAGNOSIS — S79911A Unspecified injury of right hip, initial encounter: Secondary | ICD-10-CM | POA: Diagnosis not present

## 2018-04-24 DIAGNOSIS — I25700 Atherosclerosis of coronary artery bypass graft(s), unspecified, with unstable angina pectoris: Secondary | ICD-10-CM | POA: Diagnosis not present

## 2018-04-24 DIAGNOSIS — S72114D Nondisplaced fracture of greater trochanter of right femur, subsequent encounter for closed fracture with routine healing: Secondary | ICD-10-CM | POA: Diagnosis not present

## 2018-04-24 DIAGNOSIS — M25551 Pain in right hip: Secondary | ICD-10-CM | POA: Diagnosis not present

## 2018-04-24 DIAGNOSIS — Z955 Presence of coronary angioplasty implant and graft: Secondary | ICD-10-CM | POA: Diagnosis not present

## 2018-04-24 DIAGNOSIS — N390 Urinary tract infection, site not specified: Secondary | ICD-10-CM | POA: Diagnosis not present

## 2018-04-24 DIAGNOSIS — M79651 Pain in right thigh: Secondary | ICD-10-CM | POA: Diagnosis not present

## 2018-04-24 DIAGNOSIS — Z7901 Long term (current) use of anticoagulants: Secondary | ICD-10-CM | POA: Diagnosis not present

## 2018-04-24 DIAGNOSIS — I25810 Atherosclerosis of coronary artery bypass graft(s) without angina pectoris: Secondary | ICD-10-CM | POA: Diagnosis not present

## 2018-04-24 DIAGNOSIS — G8929 Other chronic pain: Secondary | ICD-10-CM | POA: Diagnosis not present

## 2018-04-24 DIAGNOSIS — E669 Obesity, unspecified: Secondary | ICD-10-CM | POA: Diagnosis not present

## 2018-04-24 DIAGNOSIS — I739 Peripheral vascular disease, unspecified: Secondary | ICD-10-CM | POA: Diagnosis not present

## 2018-04-24 DIAGNOSIS — Z6832 Body mass index (BMI) 32.0-32.9, adult: Secondary | ICD-10-CM | POA: Diagnosis not present

## 2018-04-24 DIAGNOSIS — I10 Essential (primary) hypertension: Secondary | ICD-10-CM | POA: Diagnosis not present

## 2018-04-24 DIAGNOSIS — I482 Chronic atrial fibrillation: Secondary | ICD-10-CM | POA: Diagnosis not present

## 2018-04-24 DIAGNOSIS — I255 Ischemic cardiomyopathy: Secondary | ICD-10-CM | POA: Diagnosis not present

## 2018-04-24 DIAGNOSIS — M47816 Spondylosis without myelopathy or radiculopathy, lumbar region: Secondary | ICD-10-CM | POA: Diagnosis not present

## 2018-04-24 DIAGNOSIS — S3992XA Unspecified injury of lower back, initial encounter: Secondary | ICD-10-CM | POA: Diagnosis not present

## 2018-04-24 DIAGNOSIS — D68318 Other hemorrhagic disorder due to intrinsic circulating anticoagulants, antibodies, or inhibitors: Secondary | ICD-10-CM | POA: Diagnosis not present

## 2018-04-24 DIAGNOSIS — W19XXXD Unspecified fall, subsequent encounter: Secondary | ICD-10-CM | POA: Diagnosis not present

## 2018-04-24 DIAGNOSIS — Z880 Allergy status to penicillin: Secondary | ICD-10-CM | POA: Diagnosis not present

## 2018-04-24 DIAGNOSIS — I2510 Atherosclerotic heart disease of native coronary artery without angina pectoris: Secondary | ICD-10-CM | POA: Diagnosis present

## 2018-04-24 DIAGNOSIS — S72111A Displaced fracture of greater trochanter of right femur, initial encounter for closed fracture: Secondary | ICD-10-CM | POA: Diagnosis not present

## 2018-04-24 DIAGNOSIS — E785 Hyperlipidemia, unspecified: Secondary | ICD-10-CM | POA: Diagnosis not present

## 2018-04-24 DIAGNOSIS — Z8249 Family history of ischemic heart disease and other diseases of the circulatory system: Secondary | ICD-10-CM | POA: Diagnosis not present

## 2018-04-24 DIAGNOSIS — W010XXA Fall on same level from slipping, tripping and stumbling without subsequent striking against object, initial encounter: Secondary | ICD-10-CM | POA: Diagnosis not present

## 2018-04-24 DIAGNOSIS — Z87891 Personal history of nicotine dependence: Secondary | ICD-10-CM | POA: Diagnosis not present

## 2018-04-24 DIAGNOSIS — G629 Polyneuropathy, unspecified: Secondary | ICD-10-CM | POA: Diagnosis not present

## 2018-04-24 DIAGNOSIS — I110 Hypertensive heart disease with heart failure: Secondary | ICD-10-CM | POA: Diagnosis present

## 2018-04-24 DIAGNOSIS — M549 Dorsalgia, unspecified: Secondary | ICD-10-CM | POA: Diagnosis not present

## 2018-04-24 DIAGNOSIS — W19XXXA Unspecified fall, initial encounter: Secondary | ICD-10-CM | POA: Diagnosis not present

## 2018-04-24 DIAGNOSIS — M79604 Pain in right leg: Secondary | ICD-10-CM | POA: Diagnosis not present

## 2018-04-24 DIAGNOSIS — S8991XA Unspecified injury of right lower leg, initial encounter: Secondary | ICD-10-CM | POA: Diagnosis not present

## 2018-04-24 DIAGNOSIS — S72114A Nondisplaced fracture of greater trochanter of right femur, initial encounter for closed fracture: Secondary | ICD-10-CM | POA: Diagnosis not present

## 2018-04-24 DIAGNOSIS — S79921A Unspecified injury of right thigh, initial encounter: Secondary | ICD-10-CM | POA: Diagnosis not present

## 2018-04-24 DIAGNOSIS — M792 Neuralgia and neuritis, unspecified: Secondary | ICD-10-CM | POA: Diagnosis not present

## 2018-04-24 DIAGNOSIS — I5032 Chronic diastolic (congestive) heart failure: Secondary | ICD-10-CM | POA: Diagnosis not present

## 2018-04-24 DIAGNOSIS — M79661 Pain in right lower leg: Secondary | ICD-10-CM | POA: Diagnosis not present

## 2018-04-24 DIAGNOSIS — R2681 Unsteadiness on feet: Secondary | ICD-10-CM | POA: Diagnosis not present

## 2018-04-24 DIAGNOSIS — Z7982 Long term (current) use of aspirin: Secondary | ICD-10-CM | POA: Diagnosis not present

## 2018-04-24 DIAGNOSIS — Z96643 Presence of artificial hip joint, bilateral: Secondary | ICD-10-CM | POA: Diagnosis not present

## 2018-04-24 DIAGNOSIS — M79606 Pain in leg, unspecified: Secondary | ICD-10-CM | POA: Diagnosis present

## 2018-04-24 DIAGNOSIS — S0990XA Unspecified injury of head, initial encounter: Secondary | ICD-10-CM | POA: Diagnosis not present

## 2018-04-24 LAB — PROTIME-INR
INR: 1.66
Prothrombin Time: 19.4 seconds — ABNORMAL HIGH (ref 11.4–15.2)

## 2018-04-24 MED ORDER — MORPHINE SULFATE (PF) 2 MG/ML IV SOLN
2.0000 mg | INTRAVENOUS | Status: DC | PRN
  Administered 2018-04-24 – 2018-04-25 (×3): 2 mg via INTRAVENOUS
  Filled 2018-04-24 (×3): qty 1

## 2018-04-24 MED ORDER — WARFARIN SODIUM 7.5 MG PO TABS
7.5000 mg | ORAL_TABLET | Freq: Once | ORAL | Status: AC
  Administered 2018-04-24: 7.5 mg via ORAL
  Filled 2018-04-24: qty 1

## 2018-04-24 MED ORDER — OXYCODONE HCL 5 MG PO TABS
10.0000 mg | ORAL_TABLET | Freq: Four times a day (QID) | ORAL | Status: DC
  Administered 2018-04-24 – 2018-04-28 (×16): 10 mg via ORAL
  Filled 2018-04-24 (×16): qty 2

## 2018-04-24 NOTE — Progress Notes (Signed)
Leonard Cisneros for warfarin Indication: atrial fibrillation  Allergies  Allergen Reactions  . Augmentin [Amoxicillin-Pot Clavulanate] Nausea And Vomiting and Other (See Comments)    Has patient had a PCN reaction causing immediate rash, facial/tongue/throat swelling, SOB or lightheadedness with hypotension: Unknown Has patient had a PCN reaction causing severe rash involving mucus membranes or skin necrosis: No Has patient had a PCN reaction that required hospitalization: No Has patient had a PCN reaction occurring within the last 10 years: No If all of the above answers are "NO", then may proceed with Cephalosporin use.     Patient Measurements: Height: 5\' 11"  (180.3 cm) Weight: 230 lb (104.3 kg) IBW/kg (Calculated) : 75.3   Vital Signs: Temp: 98.7 F (37.1 C) (08/22 0546) Temp Source: Oral (08/22 0546) BP: 126/75 (08/22 0926) Pulse Rate: 115 (08/22 0926)  Labs: Recent Labs    04/22/18 0604 04/23/18 0544 04/24/18 0513  HGB  --  13.0  --   HCT  --  38.5*  --   PLT  --  153  --   LABPROT 21.1* 20.3* 19.4*  INR 1.85 1.76 1.66  CREATININE  --  0.79  --   CKTOTAL  --  89  --     Estimated Creatinine Clearance: 96.6 mL/min (by C-G formula based on SCr of 0.79 mg/dL).   Medical History: Past Medical History:  Diagnosis Date  . Atrial flutter ([place])    a. s/p remote ablation by Dr. [name].  [name] CAD (coronary artery disease)    a. s/p CABG 2003. b. 01/2016: unstable angina - s/p BMS to SVG-ramus intermediate, patent LIMA-mLAD, patent RIMA-dRCA.   [name] CHF (congestive heart failure) ([place])   . Chronic atrial fibrillation (HCC)    a. initially post op CABG then chronic. On Coumadin.  . Chronic lower back pain   . Essential hypertension   . High cholesterol   . Ischemic cardiomyopathy    a. Cath 01/2016 -  EF 50-55% with mild distal inferior hypocontractility.  [name] Unspecified hereditary and idiopathic peripheral neuropathy 04/04/2014     Assessment: Pharmacy consulted to dose warfarin in patient with history of atrial fibrillation.  [name] INR  is subtherapeutic at 1.66. [name] home dose of warfarin is 3.75 mg MF and 7.5 mg ROW.  Goal of Therapy:  INR 2-3 Monitor platelets by anticoagulation protocol: Yes   Plan:  Repeat warfarin 7.5 mg x 1 dose today Monitor daily INR and s/s of bleeding  Despina Pole, Pharm. D. Clinical Pharmacist 04/24/2018 12:38 PM

## 2018-04-24 NOTE — Progress Notes (Signed)
PROGRESS NOTE    Leonard Cisneros  GUR:[id number] DOB: 1941/05/13 DOA: 04/20/2018 PCP: [name], MD    Brief Narrative:  77 year old male admitted to the hospital with right-sided leg pain after suffering a fall.  He has a history of prior hip replacement.  Imaging of his right hip/leg has been unrevealing, but he does continue to have significant right leg pain.  Orthopedic surgery is following.   Assessment & Plan:   Principal Problem:   Fall Active Problems:   Chronic atrial fibrillation (HCC)   Obesity (BMI 30-39.9)   Essential hypertension   Neuropathic pain   Hyperlipidemia LDL goal <70   CAD (coronary artery disease) of artery bypass graft   PAD (peripheral artery disease) (HCC)   Leg pain   1. Fall with right leg pain.  Patient has had extensive imaging done including x-rays and CT of his right hip.  He does not appear to have any new fractures.  Orthopedics is following.  He underwent MRI of his lumbar spine to see if there is any referred pain.  He does have some degenerative changes on MRI, but no acute findings.  CK level normal.  Unfortunately, he continues to have significant pain and is unable to walk/bear weight on his leg.  Discussed with Dr. Aline Brochure, will pursue bone scan to evaluate for any loosening of his prosthesis.  Will check venous Dopplers to rule out DVT, although this will likely be low yield since his INR was therapeutic on arrival.  Will intensify pain management with scheduled oral pain meds and PRN IV pain medications in the efforts of we control his pain enough that he can walk.  Continue Robaxin 2. Chronic atrial fibrillation.  Anticoagulated with Coumadin.  Heart rate is stable.  Continue diltiazem and metoprolol 3. Hypertension.  Continue home medications.  Stable. 4. CAD status post CABG.  Continue aspirin, metoprolol and Imdur. 5. Chronic back pain.  Noted to have degenerative changes in the lumbar spine, but this does not appear to be the  cause of his leg pain.   DVT prophylaxis: Coumadin Code Status: Full code Family Communication: Discussed with wife at the bedside Disposition Plan: May need nursing facility placement   Consultants:   Orthopedics  Procedures:     Antimicrobials:      Subjective: Still has significant pain with moving his right leg.  Unable to bear any weight on his leg.  Cannot take even a few steps.  Objective: Vitals:   04/23/18 1952 04/24/18 0546 04/24/18 0926 04/24/18 1406  BP: 92/65 (!) 113/59 126/75 (!) 94/55  Pulse: 66 79 (!) 115 96  Resp: 18 20  18   Temp: 98.5 F (36.9 C) 98.7 F (37.1 C)  97.6 F (36.4 C)  TempSrc: Oral Oral  Oral  SpO2: 97% 94%  95%  Weight:      Height:        Intake/Output Summary (Last 24 hours) at 04/24/2018 1414 Last data filed at 04/24/2018 0942 Gross per 24 hour  Intake 483 ml  Output 550 ml  Net -67 ml   Filed Weights   04/20/18 2122 04/21/18 1415  Weight: 104.3 kg 104.3 kg    Examination:  General exam: Alert, awake, oriented x 3 Respiratory system: Clear to auscultation. Respiratory effort normal. Cardiovascular system: Irregular. No murmurs, rubs, gallops. Gastrointestinal system: Abdomen is nondistended, soft and nontender. No organomegaly or masses felt. Normal bowel sounds heard. Central nervous system: Alert and oriented. No focal neurological deficits. Extremities:  Tenderness to palpation over right thigh with increased pain upon movement of hip joint Skin: No rashes, lesions or ulcers Psychiatry: Judgement and insight appear normal. Mood & affect appropriate.  .    Data Reviewed: I have personally reviewed following labs and imaging studies  CBC: Recent Labs  Lab 04/21/18 0005 04/23/18 0544  WBC 11.0* 8.7  HGB 13.7 13.0  HCT 39.9 38.5*  MCV 102.3* 102.1*  PLT 170 401   Basic Metabolic Panel: Recent Labs  Lab 04/21/18 0005 04/23/18 0544  NA 137 136  K 4.1 3.9  CL 104 101  CO2 24 25  GLUCOSE 128* 112*    BUN 20 19  CREATININE 0.91 0.79  CALCIUM 9.2 9.1   GFR: Estimated Creatinine Clearance: 96.6 mL/min (by C-G formula based on SCr of 0.79 mg/dL). Liver Function Tests: No results for input(s): AST, ALT, ALKPHOS, BILITOT, PROT, ALBUMIN in the last 168 hours. No results for input(s): LIPASE, AMYLASE in the last 168 hours. No results for input(s): AMMONIA in the last 168 hours. Coagulation Profile: Recent Labs  Lab 04/21/18 0005 04/22/18 0604 04/23/18 0544 04/24/18 0513  INR 2.28 1.85 1.76 1.66   Cardiac Enzymes: Recent Labs  Lab 04/23/18 0544  CKTOTAL 89   BNP (last 3 results) No results for input(s): PROBNP in the last 8760 hours. HbA1C: No results for input(s): HGBA1C in the last 72 hours. CBG: No results for input(s): GLUCAP in the last 168 hours. Lipid Profile: No results for input(s): CHOL, HDL, LDLCALC, TRIG, CHOLHDL, LDLDIRECT in the last 72 hours. Thyroid Function Tests: No results for input(s): TSH, T4TOTAL, FREET4, T3FREE, THYROIDAB in the last 72 hours. Anemia Panel: No results for input(s): VITAMINB12, FOLATE, FERRITIN, TIBC, IRON, RETICCTPCT in the last 72 hours. Sepsis Labs: No results for input(s): PROCALCITON, LATICACIDVEN in the last 168 hours.  No results found for this or any previous visit (from the past 240 hour(s)).       Radiology Studies: Mr Lumbar Spine Wo Contrast  Result Date: 04/22/2018 CLINICAL DATA:  Back and right thigh pain after fall 2 days ago. EXAM: MRI LUMBAR SPINE WITHOUT CONTRAST TECHNIQUE: Multiplanar, multisequence MR imaging of the lumbar spine was performed. No intravenous contrast was administered. COMPARISON:  Lumbar spine x-rays dated April 07, 2018. FINDINGS: Segmentation:  Standard. Alignment: Straightening of the normal lumbar lordosis. Trace retrolisthesis at L3-L4. Vertebrae: No fracture, evidence of discitis, or bone lesion. Scattered degenerative endplate marrow changes. Conus medullaris and cauda equina: Conus  extends to the L1 level. Conus and cauda equina appear normal. Paraspinal and other soft tissues: Right greater than left paraspinous muscle atrophy. Disc levels: T12-L1:  Circumferential disc osteophyte complex.  No stenosis. L1-L2: Disc bulging and endplate spurring. Small right foraminal and extraforaminal disc protrusion encroaching on the right L1 nerve root. Mild spinal canal stenosis. No neuroforaminal stenosis. L2-L3: Large circumferential disc osteophyte complex resulting in moderate central spinal canal stenosis. No neuroforaminal stenosis. L3-L4: Circumferential disc osteophyte complex and mild-to-moderate bilateral facet arthropathy mild central spinal canal stenosis and moderate right lateral recess stenosis potentially affecting the descending right L4 nerve root. Mild bilateral neuroforaminal stenosis. L4-L5: Circumferential disc osteophyte complex and mild bilateral facet arthropathy. Mild bilateral neuroforaminal stenosis. No spinal canal stenosis. L5-S1: Diffuse disc bulge with large right foraminal and extraforaminal disc osteophyte complex potentially affecting the exiting right L5 nerve root. Tiny superimposed central disc protrusion. Mild bilateral facet arthropathy. Moderate bilateral neuroforaminal stenosis. No spinal canal stenosis. IMPRESSION: 1.  No acute osseous abnormality. 2.  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 3. Moderate spinal canal stenosis at L2-L3. 4. Large right foraminal and extraforaminal disc osteophyte complex at L5-S1 potentially affecting the exiting right L5 nerve root. Moderate bilateral neuroforaminal stenosis at this level. Electronically Signed   By: Titus Dubin M.D.   On: 04/22/2018 17:17        Scheduled Meds: . aspirin EC  81 mg Oral Daily  . diltiazem  240 mg Oral Daily  . hydrochlorothiazide  12.5 mg Oral Daily  . isosorbide mononitrate  30 mg Oral Daily  . lidocaine  1 patch Transdermal Q24H  .  methocarbamol  500 mg Oral QID  . metoprolol succinate  75 mg Oral Daily  . oxyCODONE  10 mg Oral Q6H  . ramipril  2.5 mg Oral Daily  . sodium chloride flush  3 mL Intravenous Q12H  . warfarin  7.5 mg Oral Once  . Warfarin - Pharmacist Dosing Inpatient   Does not apply q1800   Continuous Infusions: . sodium chloride       LOS: 0 days    Time spent: 36mins    Kathie Dike, MD [facility] Pager [phone]  If 7PM-7AM, please contact night-coverage [email or web address] Password [facility] 04/24/2018, 2:14 PM

## 2018-04-24 NOTE — Progress Notes (Signed)
Physical Therapy Treatment Patient Details Name: Leonard Cisneros MRN: [id number] DOB: Dec 15, 1940 Today's Date: 04/24/2018    History of Present Illness Leonard Cisneros is a 77 y/o male s/p fall with c/o severe right hip pain.  PMHx: CAD with prior CABG, chronic A-fib, dyslipidemia    PT Comments    Patient demonstrates good return for completing exercises with RLE, limited ROM for completing heel slides due to increased right hip pain, demonstrates improvement for use BUE for sitting up at bedside requiring less assistance, able to take a few shuffling steps on LLE when standing with RW, but unable to take steps away from bedside due to poor tolerance for attempting toe touch weightbearing on RLE due to c/o severe pain.  Patient tolerated sitting up in chair with spouse present at bedside.  Patient will benefit from continued physical therapy in hospital and recommended venue below to increase strength, balance, endurance for safe ADLs and gait.   Follow Up Recommendations  SNF     Equipment Recommendations  None recommended by PT    Recommendations for Other Services       Precautions / Restrictions Precautions Precautions: Fall Restrictions Weight Bearing Restrictions: No    Mobility  Bed Mobility Overal bed mobility: Needs Assistance Bed Mobility: Supine to Sit     Supine to sit: Min assist;Mod assist     General bed mobility comments: demonstrates improvement for propping up on hands for supine to sit, required assistance to move RLE  Transfers Overall transfer level: Needs assistance Equipment used: Rolling walker (2 wheeled) Transfers: Sit to/from [facility] Sit to Stand: Min assist Stand pivot transfers: Min assist       General transfer comment: slow labored, poor tolerance for weighbearing on RLE  Ambulation/Gait Ambulation/Gait assistance: Mod assist Gait Distance (Feet): 2 Feet Assistive device: Rolling walker (2 wheeled) Gait  Pattern/deviations: Decreased step length - right;Decreased stance time - right;Decreased stance time - left;Decreased step length - left;Decreased stride length Gait velocity: slow   General Gait Details: limited to 5-6 shuffling steps on LLE due to poor tolerance for weightbearing on RLE, unable to do toe touch on RLE due to pain   Stairs             Wheelchair Mobility    Modified Rankin (Stroke Patients Only)       Balance Overall balance assessment: Needs assistance Sitting-balance support: Feet supported;No upper extremity supported Sitting balance-Leahy Scale: Good     Standing balance support: Bilateral upper extremity supported;During functional activity Standing balance-Leahy Scale: Fair                              Cognition Arousal/Alertness: Awake/alert Behavior During Therapy: WFL for tasks assessed/performed Overall Cognitive Status: Within Functional Limits for tasks assessed                                        Exercises General Exercises - Lower Extremity Ankle Circles/Pumps: Supine;AROM;Strengthening;Both;10 reps Quad Sets: Supine;AROM;Strengthening;Right;10 reps Gluteal Sets: Supine;AROM;Strengthening;Both;10 reps Short Arc Quad: Supine;AROM;Strengthening;10 reps Heel Slides: Supine;AROM;Strengthening;Both;10 reps    General Comments        Pertinent Vitals/Pain Pain Assessment: Faces Faces Pain Scale: Hurts whole lot Pain Location: right hip with movement, no pain at rest Pain Descriptors / Indicators: Sharp;Discomfort;Grimacing Pain Intervention(s): Limited activity within patient's tolerance;Monitored during session;Premedicated  before session    Home Living                      Prior Function            PT Goals (current goals can now be found in the care plan section) Acute Rehab PT Goals Patient Stated Goal: return home after rehab PT Goal Formulation: With patient/family Time For Goal  Achievement: 05/05/18 Progress towards PT goals: Progressing toward goals    Frequency    Min 3X/week      PT Plan Current plan remains appropriate    Co-evaluation              AM-PAC PT "6 Clicks" Daily Activity  Outcome Measure  Difficulty turning over in bed (including adjusting bedclothes, sheets and blankets)?: A Little Difficulty moving from lying on back to sitting on the side of the bed? : A Lot Difficulty sitting down on and standing up from a chair with arms (e.g., wheelchair, bedside commode, etc,.)?: A Little Help needed moving to and from a bed to chair (including a wheelchair)?: A Lot Help needed walking in hospital room?: A Lot Help needed climbing 3-5 steps with a railing? : Total 6 Click Score: 13    End of Session Equipment Utilized During Treatment: Gait belt Activity Tolerance: Patient limited by fatigue;Patient limited by pain;Patient tolerated treatment well Patient left: in chair;with call bell/phone within reach Nurse Communication: Mobility status PT Visit Diagnosis: Unsteadiness on feet (R26.81);Other abnormalities of gait and mobility (R26.89);Muscle weakness (generalized) (M62.81)     Time: 2876-8115 PT Time Calculation (min) (ACUTE ONLY): 30 min  Charges:  $Therapeutic Exercise: 8-22 mins $Therapeutic Activity: 8-22 mins                     11:47 AM, 04/24/18 [name], MPT Physical Therapist with [facility] [phone] office [phone] mobile phone

## 2018-04-25 ENCOUNTER — Inpatient Hospital Stay (HOSPITAL_COMMUNITY): Payer: Medicare HMO

## 2018-04-25 ENCOUNTER — Encounter (HOSPITAL_COMMUNITY): Payer: Self-pay

## 2018-04-25 ENCOUNTER — Other Ambulatory Visit: Payer: Self-pay | Admitting: Cardiovascular Disease

## 2018-04-25 DIAGNOSIS — I25700 Atherosclerosis of coronary artery bypass graft(s), unspecified, with unstable angina pectoris: Secondary | ICD-10-CM | POA: Diagnosis not present

## 2018-04-25 DIAGNOSIS — S79911A Unspecified injury of right hip, initial encounter: Secondary | ICD-10-CM | POA: Diagnosis not present

## 2018-04-25 DIAGNOSIS — I10 Essential (primary) hypertension: Secondary | ICD-10-CM | POA: Diagnosis not present

## 2018-04-25 DIAGNOSIS — I482 Chronic atrial fibrillation: Secondary | ICD-10-CM | POA: Diagnosis not present

## 2018-04-25 DIAGNOSIS — M25551 Pain in right hip: Secondary | ICD-10-CM | POA: Diagnosis not present

## 2018-04-25 DIAGNOSIS — M79661 Pain in right lower leg: Secondary | ICD-10-CM | POA: Diagnosis not present

## 2018-04-25 DIAGNOSIS — S72111D Displaced fracture of greater trochanter of right femur, subsequent encounter for closed fracture with routine healing: Secondary | ICD-10-CM

## 2018-04-25 DIAGNOSIS — E785 Hyperlipidemia, unspecified: Secondary | ICD-10-CM | POA: Diagnosis not present

## 2018-04-25 DIAGNOSIS — W19XXXA Unspecified fall, initial encounter: Secondary | ICD-10-CM | POA: Diagnosis not present

## 2018-04-25 DIAGNOSIS — S8991XA Unspecified injury of right lower leg, initial encounter: Secondary | ICD-10-CM | POA: Diagnosis not present

## 2018-04-25 DIAGNOSIS — S72113A Displaced fracture of greater trochanter of unspecified femur, initial encounter for closed fracture: Secondary | ICD-10-CM

## 2018-04-25 DIAGNOSIS — S72114A Nondisplaced fracture of greater trochanter of right femur, initial encounter for closed fracture: Secondary | ICD-10-CM | POA: Diagnosis not present

## 2018-04-25 LAB — PROTIME-INR
INR: 1.72
Prothrombin Time: 20 seconds — ABNORMAL HIGH (ref 11.4–15.2)

## 2018-04-25 MED ORDER — TECHNETIUM TC 99M MEDRONATE IV KIT
20.0000 | PACK | Freq: Once | INTRAVENOUS | Status: AC | PRN
  Administered 2018-04-25: 20.5 via INTRAVENOUS

## 2018-04-25 MED ORDER — WARFARIN SODIUM 7.5 MG PO TABS
7.5000 mg | ORAL_TABLET | Freq: Once | ORAL | Status: AC
  Administered 2018-04-25: 7.5 mg via ORAL
  Filled 2018-04-25: qty 1

## 2018-04-25 NOTE — Progress Notes (Signed)
[place] for warfarin Indication: atrial fibrillation  Allergies  Allergen Reactions  . Augmentin [Amoxicillin-Pot Clavulanate] Nausea And Vomiting and Other (See Comments)    Has patient had a PCN reaction causing immediate rash, facial/tongue/throat swelling, SOB or lightheadedness with hypotension: Unknown Has patient had a PCN reaction causing severe rash involving mucus membranes or skin necrosis: No Has patient had a PCN reaction that required hospitalization: No Has patient had a PCN reaction occurring within the last 10 years: No If all of the above answers are "NO", then may proceed with Cephalosporin use.     Patient Measurements: Height: 5\' 11"  (180.3 cm) Weight: 230 lb (104.3 kg) IBW/kg (Calculated) : 75.3  Vital Signs: Temp: 98.5 F (36.9 C) (08/23 0545) Temp Source: Oral (08/23 0545) BP: 131/79 (08/23 0545) Pulse Rate: 76 (08/23 0545)  Labs: Recent Labs    04/23/18 0544 04/24/18 0513 04/25/18 0405  HGB 13.0  --   --   HCT 38.5*  --   --   PLT 153  --   --   LABPROT 20.3* 19.4* 20.0*  INR 1.76 1.66 1.72  CREATININE 0.79  --   --   CKTOTAL 89  --   --     Estimated Creatinine Clearance: 96.6 mL/min (by C-G formula based on SCr of 0.79 mg/dL).   Medical History: Past Medical History:  Diagnosis Date  . Atrial flutter ([place])    a. s/p remote ablation by Dr. [name].  [name] CAD (coronary artery disease)    a. s/p CABG 2003. b. 01/2016: unstable angina - s/p BMS to SVG-ramus intermediate, patent LIMA-mLAD, patent RIMA-dRCA.   [name] CHF (congestive heart failure) ([place])   . Chronic atrial fibrillation (HCC)    a. initially post op CABG then chronic. On Coumadin.  . Chronic lower back pain   . Essential hypertension   . High cholesterol   . Ischemic cardiomyopathy    a. Cath 01/2016 -  EF 50-55% with mild distal inferior hypocontractility.  [name] Unspecified hereditary and idiopathic peripheral neuropathy 04/04/2014     Assessment: Pharmacy consulted to dose warfarin in patient with history of atrial fibrillation.  [name] INR remains  Subtherapeutic but trending upward.   [name] home dose of warfarin is 3.75 mg MF and 7.5 mg ROW.  Goal of Therapy:  INR 2-3 Monitor platelets by anticoagulation protocol: Yes   Plan:  Warfarin 7.5 mg x 1 dose today Monitor daily INR and s/s of bleeding  Isac Sarna, BS [place], [place] Clinical Pharmacist Pager [phone] 04/25/2018 11:14 AM

## 2018-04-25 NOTE — Progress Notes (Signed)
Physical Therapy Treatment Patient Details Name: Leonard Cisneros MRN: [id number] DOB: 07-03-1941 Today's Date: 04/25/2018    History of Present Illness Leonard Cisneros is a 77 y/o male s/p fall with c/o severe right hip pain.  PMHx: CAD with prior CABG, chronic A-fib, dyslipidemia, hip replacements.    PT Comments    Patient refused mobility today after transferring for two procedures today. Tired; fatigue; painful.  Agreeable to performing bed exercises for continued strengthening in preparation for mobility. Recommended patient transfer bed to chair with nursing over the weekend. Patient would continue to benefit from skilled physical therapy in current environment and next venue to continue return to prior function and increase strength, endurance, balance, coordination, and functional mobility and gait skills.    Follow Up Recommendations  SNF     Equipment Recommendations  None recommended by PT    Recommendations for Other Services       Precautions / Restrictions Precautions Precautions: Fall Restrictions Weight Bearing Restrictions: No    Mobility  Bed Mobility               General bed mobility comments: patient refused mobility today after transferring for two procedures today.  Transfers                 General transfer comment: patient refused mobility today after transferring for two procedures today.  Ambulation/Gait             General Gait Details: patient refused mobility today after transferring for two procedures today.   Stairs             Wheelchair Mobility    Modified Rankin (Stroke Patients Only)       Arboriculturist Exercises - Lower Extremity Ankle Circles/Pumps: Supine;AROM;Strengthening;Both;10 reps Quad Sets: Supine;AROM;Strengthening;Right;10 reps Gluteal  Sets: Supine;AROM;Strengthening;Both;10 reps Short Arc Quad: Supine;AROM;Strengthening;10 reps;Right Heel Slides: Supine;AROM;Strengthening;10 reps;Right    General Comments        Pertinent Vitals/Pain Pain Assessment: Faces Faces Pain Scale: Hurts whole lot Pain Location: right hip with movement, no pain at rest Pain Descriptors / Indicators: Sharp;Discomfort;Grimacing Pain Intervention(s): Limited activity within patient's tolerance;Repositioned;Monitored during session    Home Living                      Prior Function            PT Goals (current goals can now be found in the care plan section) Progress towards PT goals: Progressing toward goals    Frequency    Min 3X/week      PT Plan Current plan remains appropriate    Co-evaluation              AM-PAC PT "6 Clicks" Daily Activity  Outcome Measure                   End of Session   Activity Tolerance: Patient limited by fatigue;Patient limited by pain;Patient tolerated treatment well  Patient left: in bed;with bed alarm set;with call bell/phone within reach Nurse Communication: Mobility status PT Visit Diagnosis: Unsteadiness on feet (R26.81);Other abnormalities of gait and mobility (R26.89);Muscle weakness (generalized) (M62.81)     Time: 1255-1310 PT Time Calculation (min) (ACUTE ONLY): 15 min  Charges:  $Therapeutic Exercise: 8-22 mins                     Floria Raveling. Hartnett-Rands, MS, PT [facility] [place] [phone] 04/25/2018, 1:40 PM

## 2018-04-25 NOTE — Progress Notes (Signed)
PROGRESS NOTE    Leonard Cisneros  OVZ:[id number] DOB: [birth year] DOA: 04/20/2018 PCP: [name], MD    Brief Narrative:  77 year old male admitted to the hospital with right-sided leg pain after suffering a fall.  He has a history of prior hip replacement.  Imaging of his right hip/leg has been unrevealing, but he does continue to have significant right leg pain.  Orthopedic surgery is following.  Bone scan indicates fracture of greater trochanter of right femur.  Management is nonoperative.  Continue pain management.  Seen by physical therapy who recommended skilled nursing facility placement   Assessment & Plan:   Principal Problem:   Fall Active Problems:   Chronic atrial fibrillation (HCC)   Obesity (BMI 30-39.9)   Essential hypertension   Neuropathic pain   Hyperlipidemia LDL goal <70   CAD (coronary artery disease) of artery bypass graft   PAD (peripheral artery disease) (HCC)   Leg pain   Fracture of greater trochanter of femur ([place])   1. Fracture of greater trochanter of right femur.  Patient presented with a fall and right-sided leg pain.  Initial imaging including x-rays and CT of right hip did not show any obvious fracture..  Orthopedics is following.  He underwent MRI of his lumbar spine to see if there is any referred pain.  He does have some degenerative changes on MRI, but no acute findings.  CK level normal.  Further imaging including bone scan indicated fracture of right greater trochanter femur.  Discussed with Dr. Aline Brochure considering the patient's clinical presentation, this is a likely etiology of his pain.  Management is nonoperative.  Unfortunately, due to significant pain, patient is unable to bear weight or ambulate.  His pain regimen is being adjusted so that he can work with physical therapy.  Physical therapy recommends skilled nursing facility placement. 2. Chronic atrial fibrillation.  Anticoagulated with Coumadin.  Heart rate is stable.  Continue  diltiazem and metoprolol 3. Hypertension.  Continue home medications.  Stable. 4. CAD status post CABG.  Continue aspirin, metoprolol and Imdur. 5. Chronic back pain.  Noted to have degenerative changes in the lumbar spine, but this does not appear to be the cause of his leg pain.   DVT prophylaxis: Coumadin Code Status: Full code Family Communication: No family present Disposition Plan: Awaiting nursing facility placement   Consultants:   Orthopedics  Procedures:     Antimicrobials:      Subjective: Pain is mildly better with adjustment of pain medicine, patient still cannot bear weight and cannot ambulate  Objective: Vitals:   04/24/18 1406 04/24/18 2021 04/24/18 2123 04/25/18 0545  BP: (!) 94/55  124/87 131/79  Pulse: 96  80 76  Resp: 18     Temp: 97.6 F (36.4 C)  98.7 F (37.1 C) 98.5 F (36.9 C)  TempSrc: Oral  Oral Oral  SpO2: 95% 92% 97% 96%  Weight:      Height:        Intake/Output Summary (Last 24 hours) at 04/25/2018 1639 Last data filed at 04/25/2018 1300 Gross per 24 hour  Intake 720 ml  Output 1700 ml  Net -980 ml   Filed Weights   04/20/18 2122 04/21/18 1415  Weight: 104.3 kg 104.3 kg    Examination:  General exam: Alert, awake, oriented x 3 Respiratory system: Clear to auscultation. Respiratory effort normal. Cardiovascular system:RRR. No murmurs, rubs, gallops. Gastrointestinal system: Abdomen is nondistended, soft and nontender. No organomegaly or masses felt. Normal bowel sounds heard.  Central nervous system: Alert and oriented. No focal neurological deficits. Extremities: Tenderness palpation over right hip and thigh.  Increase pain on range of motion of right leg Skin: No rashes, lesions or ulcers Psychiatry: Judgement and insight appear normal. Mood & affect appropriate.   Data Reviewed: I have personally reviewed following labs and imaging studies  CBC: Recent Labs  Lab 04/21/18 0005 04/23/18 0544  WBC 11.0* 8.7  HGB  13.7 13.0  HCT 39.9 38.5*  MCV 102.3* 102.1*  PLT 170 626   Basic Metabolic Panel: Recent Labs  Lab 04/21/18 0005 04/23/18 0544  NA 137 136  K 4.1 3.9  CL 104 101  CO2 24 25  GLUCOSE 128* 112*  BUN 20 19  CREATININE 0.91 0.79  CALCIUM 9.2 9.1   GFR: Estimated Creatinine Clearance: 96.6 mL/min (by C-G formula based on SCr of 0.79 mg/dL). Liver Function Tests: No results for input(s): AST, ALT, ALKPHOS, BILITOT, PROT, ALBUMIN in the last 168 hours. No results for input(s): LIPASE, AMYLASE in the last 168 hours. No results for input(s): AMMONIA in the last 168 hours. Coagulation Profile: Recent Labs  Lab 04/21/18 0005 04/22/18 0604 04/23/18 0544 04/24/18 0513 04/25/18 0405  INR 2.28 1.85 1.76 1.66 1.72   Cardiac Enzymes: Recent Labs  Lab 04/23/18 0544  CKTOTAL 89   BNP (last 3 results) No results for input(s): PROBNP in the last 8760 hours. HbA1C: No results for input(s): HGBA1C in the last 72 hours. CBG: No results for input(s): GLUCAP in the last 168 hours. Lipid Profile: No results for input(s): CHOL, HDL, LDLCALC, TRIG, CHOLHDL, LDLDIRECT in the last 72 hours. Thyroid Function Tests: No results for input(s): TSH, T4TOTAL, FREET4, T3FREE, THYROIDAB in the last 72 hours. Anemia Panel: No results for input(s): VITAMINB12, FOLATE, FERRITIN, TIBC, IRON, RETICCTPCT in the last 72 hours. Sepsis Labs: No results for input(s): PROCALCITON, LATICACIDVEN in the last 168 hours.  No results found for this or any previous visit (from the past 240 hour(s)).       Radiology Studies: Nm Bone Scan 3 Phase  Result Date: 04/25/2018 CLINICAL DATA:  Right hip pain since falling 04/20/2018. Hip arthroplasty performed more than 5 years ago. EXAM: NUCLEAR MEDICINE 3-PHASE BONE SCAN TECHNIQUE: Radionuclide angiographic images, immediate static blood pool images, and 3-hour delayed static images were obtained of the pelvis and proximal thighs after intravenous injection of  radiopharmaceutical. RADIOPHARMACEUTICALS:  20.5 mCi Tc-63m MDP IV COMPARISON:  Prior three-phase bone scans done 05/03/2010 and 01/21/2012. Radiographs and right hip CT 04/21/2018. FINDINGS: Vascular phase: Normal symmetric perfusion bilaterally. Blood pool phase: Minimal asymmetric blood pool activity surrounding the proximal right femur. Delayed phase: There is mildly increased focal delayed phase activity along the right greater trochanter. In review of the recent CT, there is some cortical irregularity in this area, best seen on coronal images 56 through 65/6 which could reflect a fracture. There is no abnormal activity surrounding the femoral or acetabular components of the bilateral total hip arthroplasties. IMPRESSION: 1. Low level asymmetric blood pool and delayed phase activity associated with the right femoral greater trochanter. In correlation with recent CT, this could be due to a nondisplaced fracture of the greater trochanter. 2. No evidence of hardware loosening of either total hip arthroplasty. Electronically Signed   By: Richardean Sale M.D.   On: 04/25/2018 11:15   US Venous Img Lower Unilateral Right  Result Date: 04/25/2018 CLINICAL DATA:  Pain post fall EXAM: RIGHT LOWER EXTREMITY VENOUS DOPPLER ULTRASOUND TECHNIQUE: Gray-scale sonography  with compression, as well as color and duplex ultrasound, were performed to evaluate the deep venous system from the level of the common femoral vein through the popliteal and proximal calf veins. COMPARISON:  None FINDINGS: Normal compressibility of the common femoral, superficial femoral, and popliteal veins, as well as the proximal calf veins. No filling defects to suggest DVT on grayscale or color Doppler imaging. Doppler waveforms show normal direction of venous flow, normal respiratory phasicity and response to augmentation. Visualized segments of the saphenous venous system normal in caliber and compressibility. Survey views of the contralateral  common femoral vein are unremarkable. IMPRESSION: No evidence of right lower extremity deep vein thrombosis. Electronically Signed   By: Lucrezia Europe M.D.   On: 04/25/2018 12:04        Scheduled Meds: . aspirin EC  81 mg Oral Daily  . diltiazem  240 mg Oral Daily  . hydrochlorothiazide  12.5 mg Oral Daily  . isosorbide mononitrate  30 mg Oral Daily  . lidocaine  1 patch Transdermal Q24H  . methocarbamol  500 mg Oral QID  . metoprolol succinate  75 mg Oral Daily  . oxyCODONE  10 mg Oral Q6H  . ramipril  2.5 mg Oral Daily  . sodium chloride flush  3 mL Intravenous Q12H  . warfarin  7.5 mg Oral Once  . Warfarin - Pharmacist Dosing Inpatient   Does not apply q1800   Continuous Infusions: . sodium chloride       LOS: 1 day    Time spent: 55mins    Kathie Dike, MD [facility] Pager [phone]  If 7PM-7AM, please contact night-coverage [email or web address] Password [email or web address] 04/25/2018, 4:39 PM

## 2018-04-25 NOTE — Clinical Social Work Note (Signed)
LCSW following with daily chart review and discussion with MD in [place]. Workup continues. Will follow up Monday and further assist with SNF/insurance issues if needed.

## 2018-04-26 DIAGNOSIS — W19XXXD Unspecified fall, subsequent encounter: Secondary | ICD-10-CM | POA: Diagnosis not present

## 2018-04-26 DIAGNOSIS — I25700 Atherosclerosis of coronary artery bypass graft(s), unspecified, with unstable angina pectoris: Secondary | ICD-10-CM | POA: Diagnosis not present

## 2018-04-26 DIAGNOSIS — I255 Ischemic cardiomyopathy: Secondary | ICD-10-CM | POA: Diagnosis not present

## 2018-04-26 DIAGNOSIS — I10 Essential (primary) hypertension: Secondary | ICD-10-CM | POA: Diagnosis not present

## 2018-04-26 DIAGNOSIS — I482 Chronic atrial fibrillation: Secondary | ICD-10-CM

## 2018-04-26 DIAGNOSIS — M792 Neuralgia and neuritis, unspecified: Secondary | ICD-10-CM | POA: Diagnosis not present

## 2018-04-26 DIAGNOSIS — I739 Peripheral vascular disease, unspecified: Secondary | ICD-10-CM | POA: Diagnosis not present

## 2018-04-26 DIAGNOSIS — S72114D Nondisplaced fracture of greater trochanter of right femur, subsequent encounter for closed fracture with routine healing: Secondary | ICD-10-CM | POA: Diagnosis not present

## 2018-04-26 DIAGNOSIS — R5381 Other malaise: Secondary | ICD-10-CM | POA: Diagnosis present

## 2018-04-26 DIAGNOSIS — R2681 Unsteadiness on feet: Secondary | ICD-10-CM | POA: Diagnosis not present

## 2018-04-26 DIAGNOSIS — E785 Hyperlipidemia, unspecified: Secondary | ICD-10-CM | POA: Diagnosis not present

## 2018-04-26 LAB — URINALYSIS, ROUTINE W REFLEX MICROSCOPIC
BILIRUBIN URINE: NEGATIVE
Glucose, UA: NEGATIVE mg/dL
KETONES UR: NEGATIVE mg/dL
NITRITE: NEGATIVE
Protein, ur: NEGATIVE mg/dL
Specific Gravity, Urine: 1.015 (ref 1.005–1.030)
WBC, UA: >50 WBC/hpf — ABNORMAL HIGH (ref 0–5)
pH: 5 (ref 5.0–8.0)

## 2018-04-26 LAB — PROTIME-INR
INR: 1.66
PROTHROMBIN TIME: 19.5 s — AB (ref 11.4–15.2)

## 2018-04-26 MED ORDER — WARFARIN SODIUM 7.5 MG PO TABS
7.5000 mg | ORAL_TABLET | Freq: Once | ORAL | Status: AC
  Administered 2018-04-26: 7.5 mg via ORAL
  Filled 2018-04-26: qty 1

## 2018-04-26 NOTE — Progress Notes (Signed)
Patient ID: Leonard Cisneros, male   DOB: [birth year], 77 y.o.   MRN: [id number]  Consult follow-up  Bone scan shows greater trochanteric fracture  Fracture does not require surgery the patient can be weightbearing as tolerated with physical therapy for 6 weeks with assistive device Pain control as required

## 2018-04-26 NOTE — Progress Notes (Signed)
PROGRESS NOTE    Leonard Cisneros  RJJ:[id number] DOB: [birth year] DOA: 04/20/2018 PCP: [name], MD    Brief Narrative:  77 year old male admitted to the hospital with right-sided leg pain after suffering a fall.  He has a history of prior hip replacement.  Imaging of his right hip/leg has been unrevealing, but he does continue to have significant right leg pain.  Orthopedic surgery is following.  Bone scan indicates fracture of greater trochanter of right femur.  Management is nonoperative.  Continue pain management.  Seen by physical therapy who recommended skilled nursing facility placement  Assessment & Plan:   Principal Problem:   Closed fracture of greater trochanter of right femur with routine healing Active Problems:   Chronic atrial fibrillation (HCC)   Obesity (BMI 30-39.9)   Essential hypertension   Neuropathic pain   Hyperlipidemia LDL goal <70   CAD (coronary artery disease) of artery bypass graft   Ischemic cardiomyopathy   Fall   PAD (peripheral artery disease) (HCC)   Leg pain   Physical deconditioning   Gait instability   1. Fracture of greater trochanter of right femur.  Patient presented with a fall and right-sided leg pain.  Initial imaging including x-rays and CT of right hip did not show any obvious fracture. Bone scan did confirm fracture.   Orthopedics is recommending weightbearing as tolerated with physical therapy for 6 weeks with assistive device and pain controlled as needed.[name]  He underwent MRI of his lumbar spine to see if there is any referred pain.  He does have some degenerative changes on MRI, but no acute findings.  CK level normal.   Management is nonoperative.  Physical therapy recommends skilled nursing facility placement. 2. Chronic atrial fibrillation.  Anticoagulated with Coumadin.  Heart rate is stable.  Continue diltiazem and metoprolol for rate control.   3. Hypertension.  Continue home medications.  Stable. 4. CAD status post CABG.   Continue aspirin, metoprolol and Imdur. 5. Chronic back pain.  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   DVT prophylaxis: Coumadin (dosing managed with clinical pharmacist) Code Status: Full code Family Communication: Patient Disposition Plan: Awaiting nursing facility placement  Consultants:   Orthopedics  Subjective: Patient says he can appreciate some improvement in the acute pain.  Objective: Vitals:   04/25/18 0545 04/25/18 1936 04/25/18 2201 04/26/18 0522  BP: 131/79 103/72 115/80 122/76  Pulse: 76 78 77 82  Resp:  20 20 12   Temp: 98.5 F (36.9 C) 98.3 F (36.8 C) 98.5 F (36.9 C) 98.3 F (36.8 C)  TempSrc: Oral Oral Oral Oral  SpO2: 96% 94% 94% 94%  Weight:      Height:        Intake/Output Summary (Last 24 hours) at 04/26/2018 1027 Last data filed at 04/26/2018 0930 Gross per 24 hour  Intake 723 ml  Output 1200 ml  Net -477 ml   Filed Weights   04/20/18 2122 04/21/18 1415  Weight: 104.3 kg 104.3 kg    Examination:  General exam: Alert, awake, oriented x 3.   Respiratory system: Clear to auscultation. Respiratory effort normal. Cardiovascular system:normal s1, s2 sounds. No murmurs, rubs, gallops. Gastrointestinal system: Abdomen is nondistended, soft and nontender. No organomegaly or masses felt. Normal bowel sounds heard. Central nervous system: Alert and oriented. No focal neurological deficits. Extremities: Tenderness palpation over right hip and thigh.  Increase pain on range of motion of right leg Skin: No rashes, lesions or ulcers.   Psychiatry:  Judgement and insight appear normal. Mood & affect appropriate.   Data Reviewed: I have personally reviewed following labs and imaging studies  CBC: Recent Labs  Lab 04/21/18 0005 04/23/18 0544  WBC 11.0* 8.7  HGB 13.7 13.0  HCT 39.9 38.5*  MCV 102.3* 102.1*  PLT 170 245   Basic Metabolic Panel: Recent Labs  Lab 04/21/18 0005 04/23/18 0544  NA 137 136  K 4.1 3.9  CL 104 101   CO2 24 25  GLUCOSE 128* 112*  BUN 20 19  CREATININE 0.91 0.79  CALCIUM 9.2 9.1   GFR: Estimated Creatinine Clearance: 96.6 mL/min (by C-G formula based on SCr of 0.79 mg/dL). Liver Function Tests: No results for input(s): AST, ALT, ALKPHOS, BILITOT, PROT, ALBUMIN in the last 168 hours. No results for input(s): LIPASE, AMYLASE in the last 168 hours. No results for input(s): AMMONIA in the last 168 hours. Coagulation Profile: Recent Labs  Lab 04/22/18 0604 04/23/18 0544 04/24/18 0513 04/25/18 0405 04/26/18 0534  INR 1.85 1.76 1.66 1.72 1.66   Cardiac Enzymes: Recent Labs  Lab 04/23/18 0544  CKTOTAL 89   BNP (last 3 results) No results for input(s): PROBNP in the last 8760 hours. HbA1C: No results for input(s): HGBA1C in the last 72 hours. CBG: No results for input(s): GLUCAP in the last 168 hours. Lipid Profile: No results for input(s): CHOL, HDL, LDLCALC, TRIG, CHOLHDL, LDLDIRECT in the last 72 hours. Thyroid Function Tests: No results for input(s): TSH, T4TOTAL, FREET4, T3FREE, THYROIDAB in the last 72 hours. Anemia Panel: No results for input(s): VITAMINB12, FOLATE, FERRITIN, TIBC, IRON, RETICCTPCT in the last 72 hours. Sepsis Labs: No results for input(s): PROCALCITON, LATICACIDVEN in the last 168 hours.  No results found for this or any previous visit (from the past 240 hour(s)).   Radiology Studies: Nm Bone Scan 3 Phase  Result Date: 04/25/2018 CLINICAL DATA:  Right hip pain since falling 04/20/2018. Hip arthroplasty performed more than 5 years ago. EXAM: NUCLEAR MEDICINE 3-PHASE BONE SCAN TECHNIQUE: Radionuclide angiographic images, immediate static blood pool images, and 3-hour delayed static images were obtained of the pelvis and proximal thighs after intravenous injection of radiopharmaceutical. RADIOPHARMACEUTICALS:  20.5 mCi Tc-39m MDP IV COMPARISON:  Prior three-phase bone scans done 05/03/2010 and 01/21/2012. Radiographs and right hip CT 04/21/2018.  FINDINGS: Vascular phase: Normal symmetric perfusion bilaterally. Blood pool phase: Minimal asymmetric blood pool activity surrounding the proximal right femur. Delayed phase: There is mildly increased focal delayed phase activity along the right greater trochanter. In review of the recent CT, there is some cortical irregularity in this area, best seen on coronal images 56 through 65/6 which could reflect a fracture. There is no abnormal activity surrounding the femoral or acetabular components of the bilateral total hip arthroplasties. IMPRESSION: 1. Low level asymmetric blood pool and delayed phase activity associated with the right femoral greater trochanter. In correlation with recent CT, this could be due to a nondisplaced fracture of the greater trochanter. 2. No evidence of hardware loosening of either total hip arthroplasty. Electronically Signed   By: Richardean Sale M.D.   On: 04/25/2018 11:15   US Venous Img Lower Unilateral Right  Result Date: 04/25/2018 CLINICAL DATA:  Pain post fall EXAM: RIGHT LOWER EXTREMITY VENOUS DOPPLER ULTRASOUND TECHNIQUE: Gray-scale sonography with compression, as well as color and duplex ultrasound, were performed to evaluate the deep venous system from the level of the common femoral vein through the popliteal and proximal calf veins. COMPARISON:  None FINDINGS: Normal compressibility of  the common femoral, superficial femoral, and popliteal veins, as well as the proximal calf veins. No filling defects to suggest DVT on grayscale or color Doppler imaging. Doppler waveforms show normal direction of venous flow, normal respiratory phasicity and response to augmentation. Visualized segments of the saphenous venous system normal in caliber and compressibility. Survey views of the contralateral common femoral vein are unremarkable. IMPRESSION: No evidence of right lower extremity deep vein thrombosis. Electronically Signed   By: Lucrezia Europe M.D.   On: 04/25/2018 12:04    Scheduled Meds: . aspirin EC  81 mg Oral Daily  . diltiazem  240 mg Oral Daily  . hydrochlorothiazide  12.5 mg Oral Daily  . isosorbide mononitrate  30 mg Oral Daily  . lidocaine  1 patch Transdermal Q24H  . methocarbamol  500 mg Oral QID  . metoprolol succinate  75 mg Oral Daily  . oxyCODONE  10 mg Oral Q6H  . ramipril  2.5 mg Oral Daily  . sodium chloride flush  3 mL Intravenous Q12H  . warfarin  7.5 mg Oral Once  . Warfarin - Pharmacist Dosing Inpatient   Does not apply q1800   Continuous Infusions: . sodium chloride      LOS: 2 days   Time spent: 24 mins  Irwin Brakeman, MD [facility] Pager [phone]  If 7PM-7AM, please contact night-coverage [email or web address] Password [facility] 04/26/2018, 10:27 AM

## 2018-04-26 NOTE — Progress Notes (Signed)
[place] for warfarin Indication: atrial fibrillation  Allergies  Allergen Reactions  . Augmentin [Amoxicillin-Pot Clavulanate] Nausea And Vomiting and Other (See Comments)    Has patient had a PCN reaction causing immediate rash, facial/tongue/throat swelling, SOB or lightheadedness with hypotension: Unknown Has patient had a PCN reaction causing severe rash involving mucus membranes or skin necrosis: No Has patient had a PCN reaction that required hospitalization: No Has patient had a PCN reaction occurring within the last 10 years: No If all of the above answers are "NO", then may proceed with Cephalosporin use.     Patient Measurements: Height: 5\' 11"  (180.3 cm) Weight: 230 lb (104.3 kg) IBW/kg (Calculated) : 75.3  Vital Signs: Temp: 98.3 F (36.8 C) (08/24 0522) Temp Source: Oral (08/24 0522) BP: 122/76 (08/24 0522) Pulse Rate: 82 (08/24 0522)  Labs: Recent Labs    04/24/18 0513 04/25/18 0405 04/26/18 0534  LABPROT 19.4* 20.0* 19.5*  INR 1.66 1.72 1.66    Estimated Creatinine Clearance: 96.6 mL/min (by C-G formula based on SCr of 0.79 mg/dL).   Medical History: Past Medical History:  Diagnosis Date  . Atrial flutter ([place])    a. s/p remote ablation by Dr. [name].  [name] CAD (coronary artery disease)    a. s/p CABG 2003. b. 01/2016: unstable angina - s/p BMS to SVG-ramus intermediate, patent LIMA-mLAD, patent RIMA-dRCA.   [name] CHF (congestive heart failure) ([place])   . Chronic atrial fibrillation (HCC)    a. initially post op CABG then chronic. On Coumadin.  . Chronic lower back pain   . Essential hypertension   . High cholesterol   . Ischemic cardiomyopathy    a. Cath 01/2016 -  EF 50-55% with mild distal inferior hypocontractility.  [name] Unspecified hereditary and idiopathic peripheral neuropathy 04/04/2014    Assessment: Pharmacy consulted to dose warfarin in patient with history of atrial fibrillation.  [name] INR remains  subtherapeutic .  [name] home dose of warfarin is 3.75 mg MF and 7.5 mg ROW.  Goal of Therapy:  INR 2-3 Monitor platelets by anticoagulation protocol: Yes   Plan:  Warfarin 7.5 mg x 1 dose again today Monitor daily INR and s/s of bleeding  Isac Sarna, BS [place], [place] Clinical Pharmacist Pager [phone] 04/26/2018 10:17 AM

## 2018-04-27 DIAGNOSIS — W19XXXD Unspecified fall, subsequent encounter: Secondary | ICD-10-CM | POA: Diagnosis not present

## 2018-04-27 DIAGNOSIS — M792 Neuralgia and neuritis, unspecified: Secondary | ICD-10-CM | POA: Diagnosis not present

## 2018-04-27 DIAGNOSIS — E785 Hyperlipidemia, unspecified: Secondary | ICD-10-CM | POA: Diagnosis not present

## 2018-04-27 DIAGNOSIS — R2681 Unsteadiness on feet: Secondary | ICD-10-CM | POA: Diagnosis not present

## 2018-04-27 DIAGNOSIS — I255 Ischemic cardiomyopathy: Secondary | ICD-10-CM | POA: Diagnosis not present

## 2018-04-27 DIAGNOSIS — S72114D Nondisplaced fracture of greater trochanter of right femur, subsequent encounter for closed fracture with routine healing: Secondary | ICD-10-CM | POA: Diagnosis not present

## 2018-04-27 DIAGNOSIS — I482 Chronic atrial fibrillation: Secondary | ICD-10-CM | POA: Diagnosis not present

## 2018-04-27 DIAGNOSIS — I25700 Atherosclerosis of coronary artery bypass graft(s), unspecified, with unstable angina pectoris: Secondary | ICD-10-CM | POA: Diagnosis not present

## 2018-04-27 DIAGNOSIS — I10 Essential (primary) hypertension: Secondary | ICD-10-CM | POA: Diagnosis not present

## 2018-04-27 DIAGNOSIS — I739 Peripheral vascular disease, unspecified: Secondary | ICD-10-CM | POA: Diagnosis not present

## 2018-04-27 LAB — PROTIME-INR
INR: 1.78
Prothrombin Time: 20.5 seconds — ABNORMAL HIGH (ref 11.4–15.2)

## 2018-04-27 MED ORDER — POLYETHYLENE GLYCOL 3350 17 G PO PACK
17.0000 g | PACK | Freq: Every day | ORAL | Status: DC
  Filled 2018-04-27: qty 1

## 2018-04-27 MED ORDER — SODIUM CHLORIDE 0.9 % IV SOLN
1.0000 g | INTRAVENOUS | Status: DC
  Administered 2018-04-27 – 2018-04-28 (×2): 1 g via INTRAVENOUS
  Filled 2018-04-27: qty 1
  Filled 2018-04-27: qty 10
  Filled 2018-04-27: qty 1

## 2018-04-27 MED ORDER — WARFARIN SODIUM 7.5 MG PO TABS
7.5000 mg | ORAL_TABLET | Freq: Once | ORAL | Status: AC
  Administered 2018-04-27: 7.5 mg via ORAL
  Filled 2018-04-27: qty 1

## 2018-04-27 NOTE — Progress Notes (Signed)
PROGRESS NOTE   Leonard Cisneros  HGD:[id number] DOB: 05-Dec-1940 DOA: 04/20/2018 PCP: [name], MD   Brief Narrative:  77 year old male admitted to the hospital with right-sided leg pain after suffering a fall.  He has a history of prior hip replacement.  Imaging of his right hip/leg has been unrevealing, but he does continue to have significant right leg pain.  Orthopedic surgery is following.  Bone scan indicates fracture of greater trochanter of right femur.  Management is nonoperative.  Continue pain management.  Seen by physical therapy who recommended skilled nursing facility placement  Assessment & Plan:   Principal Problem:   Closed fracture of greater trochanter of right femur with routine healing Active Problems:   Chronic atrial fibrillation (HCC)   Obesity (BMI 30-39.9)   Essential hypertension   Neuropathic pain   Hyperlipidemia LDL goal <70   CAD (coronary artery disease) of artery bypass graft   Ischemic cardiomyopathy   Fall   PAD (peripheral artery disease) (HCC)   Leg pain   Physical deconditioning   Gait instability  1. Fracture of greater trochanter of right femur.  Patient presented with a fall and right-sided leg pain.  Initial imaging including x-rays and CT of right hip did not show any obvious fracture.  Bone scan did confirm fracture.   Orthopedics is recommending weightbearing as tolerated with physical therapy for 6 weeks with assistive device and pain controlled as needed.[name]  He underwent MRI of his lumbar spine to see if there is any referred pain.  He does have some degenerative changes on MRI, but no acute findings.  CK level normal.   Management is nonoperative.  Physical therapy recommends skilled nursing facility placement. 2. Chronic atrial fibrillation.  Anticoagulated with Coumadin.  Heart rate is stable.  Continue diltiazem and metoprolol for rate control.   3. UTI - Pt reporting foul smelling cloudy urine with burning sensation, it was  tested and results suggest UTI, will treat with ceftriaxone IV.  4. Hypertension.  Continue home medications.  Stable. 5. CAD status post CABG.  Continue aspirin, metoprolol and Imdur. 6. Chronic back pain.  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   DVT prophylaxis: Coumadin (dosing managed with clinical pharmacist) Code Status: Full code Family Communication: Patient Disposition Plan: Awaiting nursing facility placement  Consultants:   Orthopedics  Subjective: Patient wants to try to ambulate more, no PT available to work with him this weekend. Reports foul smelling urine.   Objective: Vitals:   04/26/18 0522 04/26/18 1446 04/26/18 2118 04/27/18 0548  BP: 122/76 103/66 107/69 106/75  Pulse: 82 80 77 60  Resp: 12 18 20 19   Temp: 98.3 F (36.8 C) 98.7 F (37.1 C) 98.4 F (36.9 C) (!) 97.5 F (36.4 C)  TempSrc: Oral Oral Oral Oral  SpO2: 94% 95% 95% 96%  Weight:      Height:        Intake/Output Summary (Last 24 hours) at 04/27/2018 0852 Last data filed at 04/27/2018 0700 Gross per 24 hour  Intake 480 ml  Output 1850 ml  Net -1370 ml   Filed Weights   04/20/18 2122 04/21/18 1415  Weight: 104.3 kg 104.3 kg    Examination:  General exam: Alert, awake, oriented x 3.   Respiratory system: Clear to auscultation. Respiratory effort normal. Cardiovascular system:normal s1, s2 sounds. No murmurs, rubs, gallops. Gastrointestinal system: Abdomen is nondistended, soft and nontender. No organomegaly or masses felt. Normal bowel sounds heard. Central nervous system:  Alert and oriented. No focal neurological deficits. Extremities: Tenderness palpation over right hip and thigh.  Increase pain on range of motion of right leg Skin: No rashes, lesions or ulcers.   Psychiatry: Judgement and insight appear normal. Mood & affect appropriate.   Data Reviewed: I have personally reviewed following labs and imaging studies  CBC: Recent Labs  Lab 04/21/18 0005  04/23/18 0544  WBC 11.0* 8.7  HGB 13.7 13.0  HCT 39.9 38.5*  MCV 102.3* 102.1*  PLT 170 174   Basic Metabolic Panel: Recent Labs  Lab 04/21/18 0005 04/23/18 0544  NA 137 136  K 4.1 3.9  CL 104 101  CO2 24 25  GLUCOSE 128* 112*  BUN 20 19  CREATININE 0.91 0.79  CALCIUM 9.2 9.1   GFR: Estimated Creatinine Clearance: 96.6 mL/min (by C-G formula based on SCr of 0.79 mg/dL). Liver Function Tests: No results for input(s): AST, ALT, ALKPHOS, BILITOT, PROT, ALBUMIN in the last 168 hours. No results for input(s): LIPASE, AMYLASE in the last 168 hours. No results for input(s): AMMONIA in the last 168 hours. Coagulation Profile: Recent Labs  Lab 04/23/18 0544 04/24/18 0513 04/25/18 0405 04/26/18 0534 04/27/18 0651  INR 1.76 1.66 1.72 1.66 1.78   Cardiac Enzymes: Recent Labs  Lab 04/23/18 0544  CKTOTAL 89   BNP (last 3 results) No results for input(s): PROBNP in the last 8760 hours. HbA1C: No results for input(s): HGBA1C in the last 72 hours. CBG: No results for input(s): GLUCAP in the last 168 hours. Lipid Profile: No results for input(s): CHOL, HDL, LDLCALC, TRIG, CHOLHDL, LDLDIRECT in the last 72 hours. Thyroid Function Tests: No results for input(s): TSH, T4TOTAL, FREET4, T3FREE, THYROIDAB in the last 72 hours. Anemia Panel: No results for input(s): VITAMINB12, FOLATE, FERRITIN, TIBC, IRON, RETICCTPCT in the last 72 hours. Sepsis Labs: No results for input(s): PROCALCITON, LATICACIDVEN in the last 168 hours.  No results found for this or any previous visit (from the past 240 hour(s)).   Radiology Studies: Nm Bone Scan 3 Phase  Result Date: 04/25/2018 CLINICAL DATA:  Right hip pain since falling 04/20/2018. Hip arthroplasty performed more than 5 years ago. EXAM: NUCLEAR MEDICINE 3-PHASE BONE SCAN TECHNIQUE: Radionuclide angiographic images, immediate static blood pool images, and 3-hour delayed static images were obtained of the pelvis and proximal thighs after  intravenous injection of radiopharmaceutical. RADIOPHARMACEUTICALS:  20.5 mCi Tc-27m MDP IV COMPARISON:  Prior three-phase bone scans done 05/03/2010 and 01/21/2012. Radiographs and right hip CT 04/21/2018. FINDINGS: Vascular phase: Normal symmetric perfusion bilaterally. Blood pool phase: Minimal asymmetric blood pool activity surrounding the proximal right femur. Delayed phase: There is mildly increased focal delayed phase activity along the right greater trochanter. In review of the recent CT, there is some cortical irregularity in this area, best seen on coronal images 56 through 65/6 which could reflect a fracture. There is no abnormal activity surrounding the femoral or acetabular components of the bilateral total hip arthroplasties. IMPRESSION: 1. Low level asymmetric blood pool and delayed phase activity associated with the right femoral greater trochanter. In correlation with recent CT, this could be due to a nondisplaced fracture of the greater trochanter. 2. No evidence of hardware loosening of either total hip arthroplasty. Electronically Signed   By: Richardean Sale M.D.   On: 04/25/2018 11:15   Scheduled Meds: . aspirin EC  81 mg Oral Daily  . diltiazem  240 mg Oral Daily  . hydrochlorothiazide  12.5 mg Oral Daily  . isosorbide mononitrate  30 mg Oral Daily  . lidocaine  1 patch Transdermal Q24H  . methocarbamol  500 mg Oral QID  . metoprolol succinate  75 mg Oral Daily  . oxyCODONE  10 mg Oral Q6H  . ramipril  2.5 mg Oral Daily  . sodium chloride flush  3 mL Intravenous Q12H  . warfarin  7.5 mg Oral Once  . Warfarin - Pharmacist Dosing Inpatient   Does not apply q1800   Continuous Infusions: . sodium chloride    . cefTRIAXone (ROCEPHIN)  IV      LOS: 3 days   Time spent: 19 mins  Irwin Brakeman, MD [facility] Pager [phone]  If 7PM-7AM, please contact night-coverage [email or web address] Password [facility] 04/27/2018, 8:52 AM

## 2018-04-27 NOTE — Progress Notes (Signed)
[place] for warfarin Indication: atrial fibrillation  Allergies  Allergen Reactions  . Augmentin [Amoxicillin-Pot Clavulanate] Nausea And Vomiting and Other (See Comments)    Has patient had a PCN reaction causing immediate rash, facial/tongue/throat swelling, SOB or lightheadedness with hypotension: Unknown Has patient had a PCN reaction causing severe rash involving mucus membranes or skin necrosis: No Has patient had a PCN reaction that required hospitalization: No Has patient had a PCN reaction occurring within the last 10 years: No If all of the above answers are "NO", then may proceed with Cephalosporin use.     Patient Measurements: Height: 5\' 11"  (180.3 cm) Weight: 230 lb (104.3 kg) IBW/kg (Calculated) : 75.3  Vital Signs: Temp: 97.5 F (36.4 C) (08/25 0548) Temp Source: Oral (08/25 0548) BP: 106/75 (08/25 0548) Pulse Rate: 60 (08/25 0548)  Labs: Recent Labs    04/25/18 0405 04/26/18 0534 04/27/18 0651  LABPROT 20.0* 19.5* 20.5*  INR 1.72 1.66 1.78    Estimated Creatinine Clearance: 96.6 mL/min (by C-G formula based on SCr of 0.79 mg/dL).   Medical History: Past Medical History:  Diagnosis Date  . Atrial flutter ([place])    a. s/p remote ablation by Dr. [name].  [name] CAD (coronary artery disease)    a. s/p CABG 2003. b. 01/2016: unstable angina - s/p BMS to SVG-ramus intermediate, patent LIMA-mLAD, patent RIMA-dRCA.   [name] CHF (congestive heart failure) ([place])   . Chronic atrial fibrillation (HCC)    a. initially post op CABG then chronic. On Coumadin.  . Chronic lower back pain   . Essential hypertension   . High cholesterol   . Ischemic cardiomyopathy    a. Cath 01/2016 -  EF 50-55% with mild distal inferior hypocontractility.  [name] Unspecified hereditary and idiopathic peripheral neuropathy 04/04/2014    Assessment: Pharmacy consulted to dose warfarin in patient with history of atrial fibrillation.  [name] INR remains  subtherapeutic, trending upwards .  [name] home dose of warfarin is 3.75 mg MF and 7.5 mg ROW.  Goal of Therapy:  INR 2-3 Monitor platelets by anticoagulation protocol: Yes   Plan:  Warfarin 7.5 mg x 1 dose again today Monitor daily INR and s/s of bleeding  Isac Sarna, BS [place], [place] Clinical Pharmacist Pager [phone] 04/27/2018 8:33 AM

## 2018-04-28 DIAGNOSIS — S72114D Nondisplaced fracture of greater trochanter of right femur, subsequent encounter for closed fracture with routine healing: Secondary | ICD-10-CM | POA: Diagnosis not present

## 2018-04-28 DIAGNOSIS — I739 Peripheral vascular disease, unspecified: Secondary | ICD-10-CM | POA: Diagnosis not present

## 2018-04-28 DIAGNOSIS — W19XXXD Unspecified fall, subsequent encounter: Secondary | ICD-10-CM | POA: Diagnosis not present

## 2018-04-28 DIAGNOSIS — I255 Ischemic cardiomyopathy: Secondary | ICD-10-CM | POA: Diagnosis not present

## 2018-04-28 DIAGNOSIS — R2681 Unsteadiness on feet: Secondary | ICD-10-CM | POA: Diagnosis not present

## 2018-04-28 DIAGNOSIS — I10 Essential (primary) hypertension: Secondary | ICD-10-CM | POA: Diagnosis not present

## 2018-04-28 DIAGNOSIS — I482 Chronic atrial fibrillation: Secondary | ICD-10-CM | POA: Diagnosis not present

## 2018-04-28 DIAGNOSIS — M792 Neuralgia and neuritis, unspecified: Secondary | ICD-10-CM | POA: Diagnosis not present

## 2018-04-28 DIAGNOSIS — E785 Hyperlipidemia, unspecified: Secondary | ICD-10-CM | POA: Diagnosis not present

## 2018-04-28 DIAGNOSIS — I25700 Atherosclerosis of coronary artery bypass graft(s), unspecified, with unstable angina pectoris: Secondary | ICD-10-CM | POA: Diagnosis not present

## 2018-04-28 LAB — PROTIME-INR
INR: 1.8
Prothrombin Time: 20.8 seconds — ABNORMAL HIGH (ref 11.4–15.2)

## 2018-04-28 MED ORDER — METHOCARBAMOL 500 MG PO TABS: 500.0000 mg | ORAL_TABLET | Freq: Three times a day (TID) | ORAL | 0 refills | Status: DC | PRN

## 2018-04-28 MED ORDER — SENNOSIDES-DOCUSATE SODIUM 8.6-50 MG PO TABS: 2.0000 | ORAL_TABLET | Freq: Every day | ORAL | 1 refills | Status: AC

## 2018-04-28 MED ORDER — OXYCODONE-ACETAMINOPHEN 5-325 MG PO TABS: 1.0000 | ORAL_TABLET | Freq: Four times a day (QID) | ORAL | 0 refills | Status: DC | PRN

## 2018-04-28 MED ORDER — CEFPODOXIME PROXETIL 200 MG PO TABS: 200.0000 mg | ORAL_TABLET | Freq: Two times a day (BID) | ORAL | 0 refills | Status: AC

## 2018-04-28 NOTE — Progress Notes (Signed)
PROGRESS NOTE   Leonard Cisneros  XTG:[id number] DOB: September 08, 1940 DOA: 04/20/2018 PCP: [name], MD   Brief Narrative:  77 year old male admitted to the hospital with right-sided leg pain after suffering a fall.  He has a history of prior hip replacement.  Imaging of his right hip/leg has been unrevealing, but he does continue to have significant right leg pain.  Orthopedic surgery is following.  Bone scan indicates fracture of greater trochanter of right femur.  Management is nonoperative.  Continue pain management.  Seen by physical therapy who recommended skilled nursing facility placement  Assessment & Plan:   Principal Problem:   Closed fracture of greater trochanter of right femur with routine healing Active Problems:   Chronic atrial fibrillation (HCC)   Obesity (BMI 30-39.9)   Essential hypertension   Neuropathic pain   Hyperlipidemia LDL goal <70   CAD (coronary artery disease) of artery bypass graft   Ischemic cardiomyopathy   Fall   PAD (peripheral artery disease) (HCC)   Leg pain   Physical deconditioning   Gait instability  1. Fracture of greater trochanter of right femur.  Patient presented with a fall and right-sided leg pain.  Initial imaging including x-rays and CT of right hip did not show any obvious fracture.  Bone scan did confirm fracture.   Orthopedics is recommending weightbearing as tolerated with physical therapy for 6 weeks with assistive device and pain controlled as needed.[name]  He underwent MRI of his lumbar spine to see if there is any referred pain.  He does have some degenerative changes on MRI, but no acute findings.  CK level normal.   Management is nonoperative.  Physical therapy recommends skilled nursing facility placement.  If he cannot get authorization, will discharge home with home health PT.  2. Chronic atrial fibrillation.  Anticoagulated with Coumadin.  Heart rate is stable.  Continue diltiazem and metoprolol for rate control.   3. UTI -  Pt reporting foul smelling cloudy urine with burning sensation, it was tested and results suggest UTI, will treat with ceftriaxone IV.  Urine culture pending.  4. Hypertension.  Continue home medications.  Stable. 5. CAD status post CABG.  Continue aspirin, metoprolol and Imdur. 6. Chronic back pain.  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   DVT prophylaxis: Coumadin (dosing managed with clinical pharmacist) Code Status: Full code Family Communication: Patient Disposition Plan: Awaiting nursing facility placement vs home with [facility]  Consultants:   Orthopedics  Subjective: Patient says he walked the hall some yesterday.  Pain better tolerated.   Objective: Vitals:   04/27/18 0548 04/27/18 1328 04/27/18 2246 04/28/18 0647  BP: 106/75 109/73 97/71 104/61  Pulse: 60 60 72 70  Resp: 19 20  19   Temp: (!) 97.5 F (36.4 C) 98.4 F (36.9 C) 98.3 F (36.8 C) 98.3 F (36.8 C)  TempSrc: Oral  Oral Oral  SpO2: 96% 95% 95% 97%  Weight:      Height:        Intake/Output Summary (Last 24 hours) at 04/28/2018 1044 Last data filed at 04/28/2018 0700 Gross per 24 hour  Intake 1176.67 ml  Output 1100 ml  Net 76.67 ml   Filed Weights   04/20/18 2122 04/21/18 1415  Weight: 104.3 kg 104.3 kg    Examination:  General exam: Alert, awake, oriented x 3.   Respiratory system: Clear to auscultation. Respiratory effort normal. Cardiovascular system:normal s1, s2 sounds. No murmurs, rubs, gallops. Gastrointestinal system: Abdomen is nondistended, soft  and nontender. No organomegaly or masses felt. Normal bowel sounds heard. Central nervous system: Alert and oriented. No focal neurological deficits. Extremities: Tenderness palpation over right hip and thigh. improved range of motion of right leg.  Skin: No rashes, lesions or ulcers.   Psychiatry: Judgement and insight appear normal. Mood & affect appropriate.   Data Reviewed: I have personally reviewed following labs and imaging  studies  CBC: Recent Labs  Lab 04/23/18 0544  WBC 8.7  HGB 13.0  HCT 38.5*  MCV 102.1*  PLT 696   Basic Metabolic Panel: Recent Labs  Lab 04/23/18 0544  NA 136  K 3.9  CL 101  CO2 25  GLUCOSE 112*  BUN 19  CREATININE 0.79  CALCIUM 9.1   GFR: Estimated Creatinine Clearance: 96.6 mL/min (by C-G formula based on SCr of 0.79 mg/dL). Liver Function Tests: No results for input(s): AST, ALT, ALKPHOS, BILITOT, PROT, ALBUMIN in the last 168 hours. No results for input(s): LIPASE, AMYLASE in the last 168 hours. No results for input(s): AMMONIA in the last 168 hours. Coagulation Profile: Recent Labs  Lab 04/24/18 0513 04/25/18 0405 04/26/18 0534 04/27/18 0651 04/28/18 0555  INR 1.66 1.72 1.66 1.78 1.80   Cardiac Enzymes: Recent Labs  Lab 04/23/18 0544  CKTOTAL 89   BNP (last 3 results) No results for input(s): PROBNP in the last 8760 hours. HbA1C: No results for input(s): HGBA1C in the last 72 hours. CBG: No results for input(s): GLUCAP in the last 168 hours. Lipid Profile: No results for input(s): CHOL, HDL, LDLCALC, TRIG, CHOLHDL, LDLDIRECT in the last 72 hours. Thyroid Function Tests: No results for input(s): TSH, T4TOTAL, FREET4, T3FREE, THYROIDAB in the last 72 hours. Anemia Panel: No results for input(s): VITAMINB12, FOLATE, FERRITIN, TIBC, IRON, RETICCTPCT in the last 72 hours. Sepsis Labs: No results for input(s): PROCALCITON, LATICACIDVEN in the last 168 hours.  Recent Results (from the past 240 hour(s))  Culture, Urine     Status: Abnormal (Preliminary result)   Collection Time: 04/26/18  5:57 PM  Result Value Ref Range Status   Specimen Description URINE, CLEAN CATCH  Final   Special Requests   Final    NONE Performed at [facility], [address], [place], [place] [address]    Culture >=100,000 COLONIES/mL ESCHERICHIA COLI (A)  Final   Report Status PENDING  Incomplete     Radiology Studies: No results found. Scheduled Meds: . aspirin EC   81 mg Oral Daily  . diltiazem  240 mg Oral Daily  . hydrochlorothiazide  12.5 mg Oral Daily  . isosorbide mononitrate  30 mg Oral Daily  . lidocaine  1 patch Transdermal Q24H  . methocarbamol  500 mg Oral QID  . metoprolol succinate  75 mg Oral Daily  . oxyCODONE  10 mg Oral Q6H  . polyethylene glycol  17 g Oral Daily  . ramipril  2.5 mg Oral Daily  . sodium chloride flush  3 mL Intravenous Q12H  . Warfarin - Pharmacist Dosing Inpatient   Does not apply q1800   Continuous Infusions: . sodium chloride    . cefTRIAXone (ROCEPHIN)  IV Stopped (04/27/18 1521)    LOS: 4 days   Time spent: 16 mins  Irwin Brakeman, MD [facility] Pager [phone]  If 7PM-7AM, please contact night-coverage [email or web address] Password [facility] 04/28/2018, 10:44 AM

## 2018-04-28 NOTE — Discharge Summary (Addendum)
Physician Discharge Summary  Leonard Cisneros NKN:[id number] DOB: 1941/04/06 DOA: 04/20/2018  PCP: [name], MD Orthopedics: [facility] date: 04/20/2018 Discharge date: 04/28/2018  Admitted From: Home  Disposition: Home  Recommendations for Outpatient Follow-up:  1. Follow up with PCP in 1 week 2. Follow up with orthopedics in 2-3 weeks  3. Please obtain BMP/CBC in 1-2 weeks 4. Please follow up on the following pending results: Final urine culture  Home Health: Physical Therapy   Discharge Condition: STABLE   CODE STATUS: FULL    Brief Hospitalization Summary: Please see all hospital notes, images, labs for full details of the hospitalization.  HPI: Leonard Cisneros is a 77 y.o. male with medical history significant for CAD with prior CABG, CHF, chronic atrial fibrillation on Coumadin, dyslipidemia, hypertension, PAD, peripheral neuropathy, and prior bilateral hip replacements with revisions last performed in 2012.  He presented to the ED after development of severe right-sided hip pain after a mechanical [place] [employer].  He states that he was walking back towards his home and landed directly on his right hip on the asphalt after tripping over his Crocs.  He also had a slight scrape to his right knee but denies hitting his head or back and did not have any loss of consciousness.  He was actually able to walk around his house for a little while and even climb up the stairs, but shortly after lunchtime at approximately 2 PM his pain suddenly intensified and he could not even come down the stairs.  He had to call EMS to assist him into his vehicle and he drove with his wife here for further evaluation as the pain continued to worsen especially with movement.  Hip flexion and elevation of his leg causes the most severe pain in his anterior medial thigh and groin.  He did not notice any bruising or swelling to the area.  He tried taking some Tylenol without  any relief.  Of note, patient does have chronic low back pain and is scheduled for an upcoming MRI, but denies any worsening of his low back pain.   ED Course: Vital signs are stable in the ED and labs demonstrate only a mild leukocytosis of 11,000 and a glucose of 128.  His INR is therapeutic at 2.28.  Multiple imaging studies were done of the pelvic hip and knee regions with no acute fractures identified.  CT scan of the hip is also negative for any acute fractures.  There also appears to be no soft tissue edema or hematoma present.  CT of the head is also negative for any acute findings.  Unfortunately, the patient could hardly get up without severe pain and cannot even weight-bear to walk despite receiving Dilaudid in the ED.  Brief Narrative:  77 year old male admitted to the hospital with right-sided leg pain after suffering a fall.  He has a history of prior hip replacement.  Imaging of his right hip/leg has been unrevealing, but he does continue to have significant right leg pain.  Orthopedic surgery is following.  Bone scan indicates fracture of greater trochanter of right femur.  Management is nonoperative.  Continue pain management.  Seen by physical therapy who recommended skilled nursing facility placement.  His insurance company did not authorize for him to go to SNF and therefore he was discharged home with home health physical therapy.    Assessment & Plan:   Principal Problem:   Closed fracture of greater trochanter  of right femur with routine healing Active Problems:   Chronic atrial fibrillation (HCC)   Obesity (BMI 30-39.9)   Essential hypertension   Neuropathic pain   Hyperlipidemia LDL goal <70   CAD (coronary artery disease) of artery bypass graft   Ischemic cardiomyopathy   Fall   PAD (peripheral artery disease) (HCC)   Leg pain   Physical deconditioning   Gait instability  1. Fracture of greater trochanter of right femur.  Patient presented with a fall and  right-sided leg pain.  Initial imaging including x-rays and CT of right hip did not show any obvious fracture.  Bone scan did confirm fracture.   Orthopedics is recommending weightbearing as tolerated with physical therapy for 6 weeks with assistive device and pain controlled as needed.  He underwent MRI of his lumbar spine to see if there is any referred pain.  He does have some degenerative changes on MRI, but no acute findings.  CK level normal.   Management is nonoperative.  Physical therapy recommends skilled nursing facility placement.  He is discharging home with home health PT.   Follow up with orthopedics in 2 - 3 weeks for recheck.  2. Chronic atrial fibrillation.  Anticoagulated with Coumadin.  Heart rate is stable.  Continue diltiazem and metoprolol for rate control.  Follow up with PCP for further PT/INR testing and dosing management.  3. UTI - Pt reporting foul smelling cloudy urine with burning sensation, it was tested and results suggest UTI, will treat with ceftriaxone IV.  Urine culture pending.   Discharge on oral cefpodoxime to complete course.  4. Hypertension.  Continue home medications.  Stable. 5. CAD status post CABG.  Continue aspirin, metoprolol and Imdur. 6. Chronic back pain.  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   DVT prophylaxis: Coumadin (dosing managed with clinical pharmacist) Code Status: Full code Family Communication: Patient Disposition Plan: Home with home health PT.   Consultants:   Orthopedics  Discharge Diagnoses:  Principal Problem:   Closed fracture of greater trochanter of right femur with routine healing Active Problems:   Chronic atrial fibrillation (HCC)   Obesity (BMI 30-39.9)   Essential hypertension   Neuropathic pain   Hyperlipidemia LDL goal <70   CAD (coronary artery disease) of artery bypass graft   Ischemic cardiomyopathy   Fall   PAD (peripheral artery disease) (HCC)   Leg pain   Physical deconditioning    Gait instability  Discharge Instructions: Discharge Instructions    Call MD for:  difficulty breathing, headache or visual disturbances   Complete by:  As directed    Call MD for:  extreme fatigue   Complete by:  As directed    Call MD for:  persistant dizziness or light-headedness   Complete by:  As directed    Call MD for:  persistant nausea and vomiting   Complete by:  As directed    Call MD for:  severe uncontrolled pain   Complete by:  As directed    Increase activity slowly   Complete by:  As directed      Allergies as of 04/28/2018      Reactions   Augmentin [amoxicillin-pot Clavulanate] Nausea And Vomiting, Other (See Comments)   Has patient had a PCN reaction causing immediate rash, facial/tongue/throat swelling, SOB or lightheadedness with hypotension: Unknown Has patient had a PCN reaction causing severe rash involving mucus membranes or skin necrosis: No Has patient had a PCN reaction that required hospitalization: No Has patient  had a PCN reaction occurring within the last 10 years: No If all of the above answers are "NO", then may proceed with Cephalosporin use.      Medication List    STOP taking these medications   acetaminophen 500 MG tablet Commonly known as:  TYLENOL   hydrochlorothiazide 12.5 MG capsule Commonly known as:  MICROZIDE   HYDROcodone-acetaminophen 5-325 MG tablet Commonly known as:  NORCO/VICODIN   predniSONE 20 MG tablet Commonly known as:  DELTASONE     TAKE these medications   aspirin EC 81 MG tablet Take 1 tablet (81 mg total) by mouth daily.   cefpodoxime 200 MG tablet Commonly known as:  VANTIN Take 1 tablet (200 mg total) by mouth 2 (two) times daily for 3 days.   diltiazem 240 MG 24 hr capsule Commonly known as:  DILACOR XR Take 1 capsule (240 mg total) by mouth daily. KEEP OV.   isosorbide mononitrate 30 MG 24 hr tablet Commonly known as:  IMDUR TAKE 1 TABLET BY MOUTH EVERY DAY   methocarbamol 500 MG tablet Commonly  known as:  ROBAXIN Take 1 tablet (500 mg total) by mouth every 8 (eight) hours as needed for muscle spasms.   metoprolol succinate 50 MG 24 hr tablet Commonly known as:  TOPROL-XL TAKE 1 & 1/2 TABLETS BY MOUTH EVERY DAY   nitroGLYCERIN 0.4 MG SL tablet Commonly known as:  NITROSTAT DISSOLVE 1 TABLET UNDER THE TONGUE EVERY 5 MINUTES UP TO 15 MINUTES FOR CHEST PAIN   oxyCODONE-acetaminophen 5-325 MG tablet Commonly known as:  PERCOCET/ROXICET Take 1 tablet by mouth every 6 (six) hours as needed for up to 5 days for severe pain.   ramipril 2.5 MG capsule Commonly known as:  ALTACE TAKE ONE CAPSULE BY MOUTH EVERY DAY What changed:    how much to take  how to take this  when to take this   senna-docusate 8.6-50 MG tablet Commonly known as:  Senokot-S Take 2 tablets by mouth at bedtime.   warfarin 7.5 MG tablet Commonly known as:  COUMADIN Take as directed. If you are unsure how to take this medication, talk to your nurse or doctor. Original instructions:  TAKE 1/2 TO 1 TABLET BY MOUTH DAILY AS DIRECTED What changed:    how much to take  how to take this  when to take this  additional instructions  Another medication with the same name was removed. Continue taking this medication, and follow the directions you see here.      Follow-up Information    [name], MD. Schedule an appointment as soon as possible for a visit on 05/07/2018.   Specialty:  Family Medicine Why:  Hospital Follow Up 2:00 Contact information: [address] [place] [address] [phone]        Carole Civil, MD. Schedule an appointment as soon as possible for a visit in 3 week(s).   Specialties:  Orthopedic Surgery, Radiology Why:  Hospital Follow Up  Contact information: [address] [place] [address] [phone]          Allergies  Allergen Reactions  . Augmentin [Amoxicillin-Pot Clavulanate] Nausea And Vomiting and Other (See Comments)     Has patient had a PCN reaction causing immediate rash, facial/tongue/throat swelling, SOB or lightheadedness with hypotension: Unknown Has patient had a PCN reaction causing severe rash involving mucus membranes or skin necrosis: No Has patient had a PCN reaction that required hospitalization: No Has patient had a PCN  reaction occurring within the last 10 years: No If all of the above answers are "NO", then may proceed with Cephalosporin use.    Allergies as of 04/28/2018      Reactions   Augmentin [amoxicillin-pot Clavulanate] Nausea And Vomiting, Other (See Comments)   Has patient had a PCN reaction causing immediate rash, facial/tongue/throat swelling, SOB or lightheadedness with hypotension: Unknown Has patient had a PCN reaction causing severe rash involving mucus membranes or skin necrosis: No Has patient had a PCN reaction that required hospitalization: No Has patient had a PCN reaction occurring within the last 10 years: No If all of the above answers are "NO", then may proceed with Cephalosporin use.      Medication List    STOP taking these medications   acetaminophen 500 MG tablet Commonly known as:  TYLENOL   hydrochlorothiazide 12.5 MG capsule Commonly known as:  MICROZIDE   HYDROcodone-acetaminophen 5-325 MG tablet Commonly known as:  NORCO/VICODIN   predniSONE 20 MG tablet Commonly known as:  DELTASONE     TAKE these medications   aspirin EC 81 MG tablet Take 1 tablet (81 mg total) by mouth daily.   cefpodoxime 200 MG tablet Commonly known as:  VANTIN Take 1 tablet (200 mg total) by mouth 2 (two) times daily for 3 days.   diltiazem 240 MG 24 hr capsule Commonly known as:  DILACOR XR Take 1 capsule (240 mg total) by mouth daily. KEEP OV.   isosorbide mononitrate 30 MG 24 hr tablet Commonly known as:  IMDUR TAKE 1 TABLET BY MOUTH EVERY DAY   methocarbamol 500 MG tablet Commonly known as:  ROBAXIN Take 1 tablet (500 mg total) by mouth every 8 (eight)  hours as needed for muscle spasms.   metoprolol succinate 50 MG 24 hr tablet Commonly known as:  TOPROL-XL TAKE 1 & 1/2 TABLETS BY MOUTH EVERY DAY   nitroGLYCERIN 0.4 MG SL tablet Commonly known as:  NITROSTAT DISSOLVE 1 TABLET UNDER THE TONGUE EVERY 5 MINUTES UP TO 15 MINUTES FOR CHEST PAIN   oxyCODONE-acetaminophen 5-325 MG tablet Commonly known as:  PERCOCET/ROXICET Take 1 tablet by mouth every 6 (six) hours as needed for up to 5 days for severe pain.   ramipril 2.5 MG capsule Commonly known as:  ALTACE TAKE ONE CAPSULE BY MOUTH EVERY DAY What changed:    how much to take  how to take this  when to take this   senna-docusate 8.6-50 MG tablet Commonly known as:  Senokot-S Take 2 tablets by mouth at bedtime.   warfarin 7.5 MG tablet Commonly known as:  COUMADIN Take as directed. If you are unsure how to take this medication, talk to your nurse or doctor. Original instructions:  TAKE 1/2 TO 1 TABLET BY MOUTH DAILY AS DIRECTED What changed:    how much to take  how to take this  when to take this  additional instructions  Another medication with the same name was removed. Continue taking this medication, and follow the directions you see here.       Procedures/Studies: Dg Lumbar Spine Complete  Result Date: 04/07/2018 CLINICAL DATA:  Low back pain with no known injury. Remote history of back surgery. Previous bilateral hip replacement. EXAM: LUMBAR SPINE - COMPLETE 4+ VIEW COMPARISON:  No recent studies in [facility] FINDINGS: The lumbar vertebral bodies are preserved in height. There is moderate multilevel disc space height loss from L2-3 through L5-4 5. There is no spondylolisthesis. There are anterior and posterior endplate  osteophytes at all lumbar levels. There is facet joint hypertrophy in the mid and lower lumbar spine. The pedicles and transverse processes are intact where visualized. IMPRESSION: There is multilevel moderate degenerative disc space narrowing and  facet joint hypertrophy. No compression fracture or spondylolisthesis. Abdominal aortic atherosclerosis. Electronically Signed   By: David  Martinique M.D.   On: 04/07/2018 11:36   Ct Head Wo Contrast  Result Date: 04/21/2018 CLINICAL DATA:  Patient fell today on asphalt. No loss of consciousness. EXAM: CT HEAD WITHOUT CONTRAST TECHNIQUE: Contiguous axial images were obtained from the base of the skull through the vertex without intravenous contrast. COMPARISON:  None. FINDINGS: Brain: No evidence of acute infarction, hemorrhage, hydrocephalus, extra-axial collection or mass lesion/mass effect. Diffuse cerebral atrophy. Vascular: Moderate intracranial arterial vascular calcifications are present. Skull: Calvarium appears intact. No acute depressed skull fractures. Sinuses/Orbits: Mucosal thickening of the paranasal sinuses with opacification of multiple ethmoid air cells. No acute air-fluid levels. Mastoid air cells are clear. Other: None. IMPRESSION: No acute intracranial abnormalities. Chronic atrophy. Probable inflammatory changes in the paranasal sinuses. Electronically Signed   By: Lucienne Capers M.D.   On: 04/21/2018 00:44   Mr Lumbar Spine Wo Contrast  Result Date: 04/22/2018 CLINICAL DATA:  Back and right thigh pain after fall 2 days ago. EXAM: MRI LUMBAR SPINE WITHOUT CONTRAST TECHNIQUE: Multiplanar, multisequence MR imaging of the lumbar spine was performed. No intravenous contrast was administered. COMPARISON:  Lumbar spine x-rays dated April 07, 2018. FINDINGS: Segmentation:  Standard. Alignment: Straightening of the normal lumbar lordosis. Trace retrolisthesis at L3-L4. Vertebrae: No fracture, evidence of discitis, or bone lesion. Scattered degenerative endplate marrow changes. Conus medullaris and cauda equina: Conus extends to the L1 level. Conus and cauda equina appear normal. Paraspinal and other soft tissues: Right greater than left paraspinous muscle atrophy. Disc levels: T12-L1:   Circumferential disc osteophyte complex.  No stenosis. L1-L2: Disc bulging and endplate spurring. Small right foraminal and extraforaminal disc protrusion encroaching on the right L1 nerve root. Mild spinal canal stenosis. No neuroforaminal stenosis. L2-L3: Large circumferential disc osteophyte complex resulting in moderate central spinal canal stenosis. No neuroforaminal stenosis. L3-L4: Circumferential disc osteophyte complex and mild-to-moderate bilateral facet arthropathy mild central spinal canal stenosis and moderate right lateral recess stenosis potentially affecting the descending right L4 nerve root. Mild bilateral neuroforaminal stenosis. L4-L5: Circumferential disc osteophyte complex and mild bilateral facet arthropathy. Mild bilateral neuroforaminal stenosis. No spinal canal stenosis. L5-S1: Diffuse disc bulge with large right foraminal and extraforaminal disc osteophyte complex potentially affecting the exiting right L5 nerve root. Tiny superimposed central disc protrusion. Mild bilateral facet arthropathy. Moderate bilateral neuroforaminal stenosis. No spinal canal stenosis. IMPRESSION: 1.  No acute osseous abnormality. 2. 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 3. Moderate spinal canal stenosis at L2-L3. 4. Large right foraminal and extraforaminal disc osteophyte complex at L5-S1 potentially affecting the exiting right L5 nerve root. Moderate bilateral neuroforaminal stenosis at this level. Electronically Signed   By: Titus Dubin M.D.   On: 04/22/2018 17:17   Nm Bone Scan 3 Phase  Result Date: 04/25/2018 CLINICAL DATA:  Right hip pain since falling 04/20/2018. Hip arthroplasty performed more than 5 years ago. EXAM: NUCLEAR MEDICINE 3-PHASE BONE SCAN TECHNIQUE: Radionuclide angiographic images, immediate static blood pool images, and 3-hour delayed static images were obtained of the pelvis and proximal thighs after intravenous injection of  radiopharmaceutical. RADIOPHARMACEUTICALS:  20.5 mCi Tc-35m MDP IV COMPARISON:  Prior three-phase bone scans done 05/03/2010 and 01/21/2012.  Radiographs and right hip CT 04/21/2018. FINDINGS: Vascular phase: Normal symmetric perfusion bilaterally. Blood pool phase: Minimal asymmetric blood pool activity surrounding the proximal right femur. Delayed phase: There is mildly increased focal delayed phase activity along the right greater trochanter. In review of the recent CT, there is some cortical irregularity in this area, best seen on coronal images 56 through 65/6 which could reflect a fracture. There is no abnormal activity surrounding the femoral or acetabular components of the bilateral total hip arthroplasties. IMPRESSION: 1. Low level asymmetric blood pool and delayed phase activity associated with the right femoral greater trochanter. In correlation with recent CT, this could be due to a nondisplaced fracture of the greater trochanter. 2. No evidence of hardware loosening of either total hip arthroplasty. Electronically Signed   By: Richardean Sale M.D.   On: 04/25/2018 11:15   Ct Hip Right Wo Contrast  Result Date: 04/21/2018 CLINICAL DATA:  Hip trauma EXAM: CT OF THE RIGHT HIP WITHOUT CONTRAST TECHNIQUE: Multidetector CT imaging of the right hip was performed according to the standard protocol. Multiplanar CT image reconstructions were also generated. COMPARISON:  Radiographs 04/20/2018, 12/06/2010, 05/03/2010 FINDINGS: Bones/Joint/Cartilage Status post right hip replacement with associated artifact. No dislocation evident. No definitive fracture lucency is seen. The right pubic rami appear intact. Amorphous material along the inter margin of the right acetabulum, may reflect mild dystrophic calcification or postsurgical change, radiographically this appears stable. No large hip effusion. Ligaments Suboptimally assessed by CT. Muscles and Tendons No intramuscular fluid collections. Soft tissues Mild soft  tissue thickening adjacent to the trochanter, likely due to postsurgical change. No hyperdense fluid collections within the soft tissues to suggest soft tissue hematoma. Dense vascular calcifications. IMPRESSION: Status post right hip replacement.  No definitive fracture is seen. Electronically Signed   By: Donavan Foil M.D.   On: 04/21/2018 01:16   US Venous Img Lower Unilateral Right  Result Date: 04/25/2018 CLINICAL DATA:  Pain post fall EXAM: RIGHT LOWER EXTREMITY VENOUS DOPPLER ULTRASOUND TECHNIQUE: Gray-scale sonography with compression, as well as color and duplex ultrasound, were performed to evaluate the deep venous system from the level of the common femoral vein through the popliteal and proximal calf veins. COMPARISON:  None FINDINGS: Normal compressibility of the common femoral, superficial femoral, and popliteal veins, as well as the proximal calf veins. No filling defects to suggest DVT on grayscale or color Doppler imaging. Doppler waveforms show normal direction of venous flow, normal respiratory phasicity and response to augmentation. Visualized segments of the saphenous venous system normal in caliber and compressibility. Survey views of the contralateral common femoral vein are unremarkable. IMPRESSION: No evidence of right lower extremity deep vein thrombosis. Electronically Signed   By: Lucrezia Europe M.D.   On: 04/25/2018 12:04   Dg Knee Complete 4 Views Right  Result Date: 04/21/2018 CLINICAL DATA:  Fall EXAM: RIGHT KNEE - COMPLETE 4+ VIEW COMPARISON:  10/22/2016 FINDINGS: No fracture or malalignment. Mild patellofemoral and medial joint space degenerative change. Mild degenerative change of the lateral compartment. No significant the fusion. Vascular calcification. IMPRESSION: Degenerative changes without acute osseous abnormality. Electronically Signed   By: Donavan Foil M.D.   On: 04/21/2018 00:36   Dg Hip Unilat W Or Wo Pelvis 2-3 Views Right  Result Date: 04/20/2018 CLINICAL  DATA:  Fall with history of hip replacements EXAM: DG HIP (WITH OR WITHOUT PELVIS) 2-3V RIGHT COMPARISON:  None. FINDINGS: Pubic symphysis and rami are intact. Status post left hip replacement with normal alignment. Status  post right hip replacement without fracture or malalignment. IMPRESSION: Status post bilateral hip replacements without acute osseous abnormality Electronically Signed   By: Donavan Foil M.D.   On: 04/20/2018 22:23   Dg Femur 1 View Right  Result Date: 04/21/2018 CLINICAL DATA:  Fall with leg pain EXAM: RIGHT FEMUR 1 VIEW COMPARISON:  None. FINDINGS: Status post right hip replacement with normal alignment. No fracture or dislocation. Vascular calcification IMPRESSION: No acute osseous abnormality Electronically Signed   By: Donavan Foil M.D.   On: 04/21/2018 00:37     Subjective: Pt says that he agrees to go home with HHPT.  He was able to ambulate the hall yesterday and he feels good about that.  His pain is controlled.    Discharge Exam: Vitals:   04/27/18 2246 04/28/18 0647  BP: 97/71 104/61  Pulse: 72 70  Resp:  19  Temp: 98.3 F (36.8 C) 98.3 F (36.8 C)  SpO2: 95% 97%   Vitals:   04/27/18 0548 04/27/18 1328 04/27/18 2246 04/28/18 0647  BP: 106/75 109/73 97/71 104/61  Pulse: 60 60 72 70  Resp: 19 20  19   Temp: (!) 97.5 F (36.4 C) 98.4 F (36.9 C) 98.3 F (36.8 C) 98.3 F (36.8 C)  TempSrc: Oral  Oral Oral  SpO2: 96% 95% 95% 97%  Weight:      Height:        General exam: Alert, awake, oriented x 3.   Respiratory system: Clear to auscultation. Respiratory effort normal. Cardiovascular system:normal s1, s2 sounds. Irregular.  No murmurs, rubs, gallops. Gastrointestinal system: Abdomen is nondistended, soft and nontender. No organomegaly or masses felt. Normal bowel sounds heard. Central nervous system: Alert and oriented. No focal neurological deficits. Extremities: Tenderness palpation over right hip and thigh. improved range of motion of right leg.   Skin: No rashes, lesions or ulcers.   Psychiatry: Judgement and insight appear normal. Mood & affect appropriate.    The results of significant diagnostics from this hospitalization (including imaging, microbiology, ancillary and laboratory) are listed below for reference.     Microbiology: Recent Results (from the past 240 hour(s))  Culture, Urine     Status: Abnormal (Preliminary result)   Collection Time: 04/26/18  5:57 PM  Result Value Ref Range Status   Specimen Description URINE, CLEAN CATCH  Final   Special Requests   Final    NONE Performed at [facility], [address], [place], [place] [address]    Culture >=100,000 COLONIES/mL ESCHERICHIA COLI (A)  Final   Report Status PENDING  Incomplete     Labs: BNP (last 3 results) No results for input(s): BNP in the last 8760 hours. Basic Metabolic Panel: Recent Labs  Lab 04/23/18 0544  NA 136  K 3.9  CL 101  CO2 25  GLUCOSE 112*  BUN 19  CREATININE 0.79  CALCIUM 9.1   Liver Function Tests: No results for input(s): AST, ALT, ALKPHOS, BILITOT, PROT, ALBUMIN in the last 168 hours. No results for input(s): LIPASE, AMYLASE in the last 168 hours. No results for input(s): AMMONIA in the last 168 hours. CBC: Recent Labs  Lab 04/23/18 0544  WBC 8.7  HGB 13.0  HCT 38.5*  MCV 102.1*  PLT 153   Cardiac Enzymes: Recent Labs  Lab 04/23/18 0544  CKTOTAL 89   BNP: Invalid input(s): POCBNP CBG: No results for input(s): GLUCAP in the last 168 hours. D-Dimer No results for input(s): DDIMER in the last 72 hours. Hgb A1c No results  for input(s): HGBA1C in the last 72 hours. Lipid Profile No results for input(s): CHOL, HDL, LDLCALC, TRIG, CHOLHDL, LDLDIRECT in the last 72 hours. Thyroid function studies No results for input(s): TSH, T4TOTAL, T3FREE, THYROIDAB in the last 72 hours.  Invalid input(s): FREET3 Anemia work up No results for input(s): VITAMINB12, FOLATE, FERRITIN, TIBC, IRON, RETICCTPCT in the last 72  hours. Urinalysis    Component Value Date/Time   COLORURINE AMBER (A) 04/26/2018 1751   APPEARANCEUR HAZY (A) 04/26/2018 1751   LABSPEC 1.015 04/26/2018 1751   PHURINE 5.0 04/26/2018 1751   GLUCOSEU NEGATIVE 04/26/2018 1751   HGBUR SMALL (A) 04/26/2018 1751   BILIRUBINUR NEGATIVE 04/26/2018 1751   KETONESUR NEGATIVE 04/26/2018 1751   PROTEINUR NEGATIVE 04/26/2018 1751   UROBILINOGEN 0.2 11/28/2010 0915   NITRITE NEGATIVE 04/26/2018 1751   LEUKOCYTESUR MODERATE (A) 04/26/2018 1751   Sepsis Labs Invalid input(s): PROCALCITONIN,  WBC,  LACTICIDVEN Microbiology Recent Results (from the past 240 hour(s))  Culture, Urine     Status: Abnormal (Preliminary result)   Collection Time: 04/26/18  5:57 PM  Result Value Ref Range Status   Specimen Description URINE, CLEAN CATCH  Final   Special Requests   Final    NONE Performed at [facility], [address], [place], [place] [address]    Culture >=100,000 COLONIES/mL ESCHERICHIA COLI (A)  Final   Report Status PENDING  Incomplete    Time coordinating discharge: 35 Minutes  SIGNED:  Irwin Brakeman, MD  [facility] 04/28/2018, 1:31 PM Pager [phone]  If 7PM-7AM, please contact night-coverage [email or web address] Password [email or web address]

## 2018-04-28 NOTE — Progress Notes (Signed)
[place] for warfarin Indication: atrial fibrillation  Patient Measurements: Height: 5\' 11"  (180.3 cm) Weight: 230 lb (104.3 kg) IBW/kg (Calculated) : 75.3  Vital Signs: Temp: 98.3 F (36.8 C) (08/26 0647) Temp Source: Oral (08/26 0647) BP: 104/61 (08/26 0647) Pulse Rate: 70 (08/26 0647)  Labs: Recent Labs    04/26/18 0534 04/27/18 0651 04/28/18 0555  LABPROT 19.5* 20.5* 20.8*  INR 1.66 1.78 1.80    Estimated Creatinine Clearance: 96.6 mL/min (by C-G formula based on SCr of 0.79 mg/dL).    Assessment: Pharmacy consulted to dose warfarin in patient with history of atrial fibrillation.  [name] INR remains subtherapeutic, so will give booster dose today.  [name] home dose of warfarin is 3.75 mg MF and 7.5 mg ROW.  Goal of Therapy:  INR 2-3 Monitor platelets by anticoagulation protocol: Yes   Plan: INR 1.8 today Warfarin 10 mg x 1 dose today Monitor daily INR and s/s of bleeding  Despina Pole, Pharm. D. Clinical Pharmacist 04/28/2018 1:34 PM

## 2018-04-28 NOTE — Care Management Note (Signed)
Case Management Note  Patient Details  Name: BENSYN BORNEMANN MRN: [id number] Date of Birth: [birth year]        Acute hip fx, non-operative, WBAT with PT. Initially recommended for SNF now with [facility] PT recommendation. Pt DC home today. Has chosen [facility] from list of [facility] options. Pt aware HH has 48 hrs to make first visit. Vaughan Basta, [facility] rep, given referral.               Expected Discharge Date:  04/28/18               Expected Discharge Plan:  [place]  In-House Referral:  Clinical Social Work  Discharge planning Services  CM Consult  Post Acute Care Choice:  Home Health, [place] Choice offered to:    Patient  HH Arranged:  PT [place] Agency:  [place]  Status of Service:  Completed, signed off  If discussed at [place] of Stay Meetings, dates discussed:    Additional Comments:  Sherald Barge, RN 04/28/2018, 3:32 PM

## 2018-04-28 NOTE — Progress Notes (Signed)
Physical Therapy Treatment Patient Details Name: Leonard Cisneros MRN: [id number] DOB: Jan 17, 1941 Today's Date: 04/28/2018    History of Present Illness Leonard Cisneros is a 77 y/o male s/p fall with c/o severe right hip pain.  PMHx: CAD with prior CABG, chronic A-fib, dyslipidemia, hip replacements.    PT Comments    Patient presents seated in chair with his spouse present in room.  Patient c/o less pain in right hip with movement and demonstrates increased endurance/distance for gait training with axillary crutches and RW without loss of balance.  Patient patient stated he is able to tolerate more weightbearing on RLE when using RW and feels that it easier using RW for ambulation.  Patient verbally instructed in proper steps sequence for going up/down steps and encouraged to have assistance for going up/down his 2 steps at home with understanding acknowledged.  Patient will benefit from continued physical therapy in hospital and recommended venue below to increase strength, balance, endurance for safe ADLs and gait.   Follow Up Recommendations  Home health PT;Supervision for mobility/OOB;Supervision/Assistance - 24 hour     Equipment Recommendations  None recommended by PT    Recommendations for Other Services       Precautions / Restrictions Precautions Precautions: Fall Restrictions Weight Bearing Restrictions: No    Mobility  Bed Mobility Overal bed mobility: Modified Independent Bed Mobility: Supine to Sit;Sit to Supine           General bed mobility comments: use of siderail for sitting up, increased time  Transfers Overall transfer level: Needs assistance Equipment used: Rolling walker (2 wheeled);Crutches;None Transfers: Sit to/from [facility] Sit to Stand: Min guard Stand pivot transfers: Min guard       General transfer comment: Min assist for sit to stand without using AD, Min guard using crutches for sit to stand/transfers, Supervised  using RW  Ambulation/Gait Ambulation/Gait assistance: Supervision Gait Distance (Feet): 65 Feet Assistive device: Crutches;Rolling walker (2 wheeled) Gait Pattern/deviations: Decreased step length - right;Decreased stance time - right;Decreased stride length Gait velocity: decreased   General Gait Details: demonstrates mostly 3 point gait pattern using axillary crutches without loss of balance, but limited secondary to fatigue, patient stated more stability and feels safer when taking steps in room using RW   Stairs             Wheelchair Mobility    Modified Rankin (Stroke Patients Only)       Balance Overall balance assessment: Needs assistance Sitting-balance support: Feet supported;No upper extremity supported Sitting balance-Leahy Scale: Good     Standing balance support: During functional activity;Bilateral upper extremity supported Standing balance-Leahy Scale: Fair Standing balance comment: using RW or crutches, requires Min hand held assist when not using an AD                            Cognition Arousal/Alertness: Awake/alert Behavior During Therapy: WFL for tasks assessed/performed Overall Cognitive Status: Within Functional Limits for tasks assessed                                        Exercises General Exercises - Lower Extremity Long Arc Quad: Seated;AROM;Strengthening;Right;10 reps Hip Flexion/Marching: Seated;AROM;Strengthening;Both;10 reps Toe Raises: Seated;AROM;Strengthening;Both;10 reps Heel Raises: Seated;AROM;Strengthening;Both;10 reps    General Comments        Pertinent Vitals/Pain Pain Assessment: Faces Faces Pain Scale:  Hurts little more Pain Location: right hip with movement, no pain at rest Pain Descriptors / Indicators: Sore;Discomfort Pain Intervention(s): Limited activity within patient's tolerance;Monitored during session;Premedicated before session    Home Living                       Prior Function            PT Goals (current goals can now be found in the care plan section) Acute Rehab PT Goals Patient Stated Goal: return home with family to assist PT Goal Formulation: With patient/family Time For Goal Achievement: 05/05/18 Progress towards PT goals: Progressing toward goals    Frequency    Min 3X/week      PT Plan Current plan remains appropriate    Co-evaluation              AM-PAC PT "6 Clicks" Daily Activity  Outcome Measure  Difficulty turning over in bed (including adjusting bedclothes, sheets and blankets)?: None Difficulty moving from lying on back to sitting on the side of the bed? : None Difficulty sitting down on and standing up from a chair with arms (e.g., wheelchair, bedside commode, etc,.)?: A Little Help needed moving to and from a bed to chair (including a wheelchair)?: A Little Help needed walking in hospital room?: A Little Help needed climbing 3-5 steps with a railing? : A Lot 6 Click Score: 19    End of Session   Activity Tolerance: Patient tolerated treatment well;Patient limited by fatigue Patient left: in chair;with call bell/phone within reach;with family/visitor present Nurse Communication: Mobility status PT Visit Diagnosis: Unsteadiness on feet (R26.81);Other abnormalities of gait and mobility (R26.89);Muscle weakness (generalized) (M62.81)     Time: 8270-7867 PT Time Calculation (min) (ACUTE ONLY): 26 min  Charges:  $Gait Training: 8-22 mins $Therapeutic Exercise: 8-22 mins                     2:00 PM, 04/28/18 [name], MPT Physical Therapist with [facility] [phone] office [phone] mobile phone

## 2018-04-28 NOTE — Clinical Social Work Note (Signed)
Patient was recommended for HHPT.  Case management is aware.   LCSW signing off.    Subrena Devereux, Clydene Pugh, LCSW

## 2018-04-28 NOTE — Care Management Important Message (Signed)
Important Message  Patient Details  Name: Leonard Cisneros MRN: [id number] Date of Birth: Apr 27, 1941   Medicare Important Message Given:  Yes    Shelda Altes 04/28/2018, 11:36 AM

## 2018-04-28 NOTE — Progress Notes (Signed)
Patient discharged home with wife, crutches, persnal belongings and prescriptions. IV removed and site intact.

## 2018-04-28 NOTE — Discharge Instructions (Signed)
HOME HEALTH PHYSICAL THERAPY  Weight bear as tolerated for next 6 weeks with physical therapy and assistive device as needed.    Fall Prevention in the Home Falls can cause injuries. They can happen to people of all ages. There are many things you can do to make your home safe and to help prevent falls. What can I do on the outside of my home?  Regularly fix the edges of walkways and driveways and fix any cracks.  Remove anything that might make you trip as you walk through a door, such as a raised step or threshold.  Trim any bushes or trees on the path to your home.  Use bright outdoor lighting.  Clear any walking paths of anything that might make someone trip, such as rocks or tools.  Regularly check to see if handrails are loose or broken. Make sure that both sides of any steps have handrails.  Any raised decks and porches should have guardrails on the edges.  Have any leaves, snow, or ice cleared regularly.  Use sand or salt on walking paths during winter.  Clean up any spills in your garage right away. This includes oil or grease spills. What can I do in the bathroom?  Use night lights.  Install grab bars by the toilet and in the tub and shower. Do not use towel bars as grab bars.  Use non-skid mats or decals in the tub or shower.  If you need to sit down in the shower, use a plastic, non-slip stool.  Keep the floor dry. Clean up any water that spills on the floor as soon as it happens.  Remove soap buildup in the tub or shower regularly.  Attach bath mats securely with double-sided non-slip rug tape.  Do not have throw rugs and other things on the floor that can make you trip. What can I do in the bedroom?  Use night lights.  Make sure that you have a light by your bed that is easy to reach.  Do not use any sheets or blankets that are too big for your bed. They should not hang down onto the floor.  Have a firm chair that has side arms. You can use this for  support while you get dressed.  Do not have throw rugs and other things on the floor that can make you trip. What can I do in the kitchen?  Clean up any spills right away.  Avoid walking on wet floors.  Keep items that you use a lot in easy-to-reach places.  If you need to reach something above you, use a strong step stool that has a grab bar.  Keep electrical cords out of the way.  Do not use floor polish or wax that makes floors slippery. If you must use wax, use non-skid floor wax.  Do not have throw rugs and other things on the floor that can make you trip. What can I do with my stairs?  Do not leave any items on the stairs.  Make sure that there are handrails on both sides of the stairs and use them. Fix handrails that are broken or loose. Make sure that handrails are as long as the stairways.  Check any carpeting to make sure that it is firmly attached to the stairs. Fix any carpet that is loose or worn.  Avoid having throw rugs at the top or bottom of the stairs. If you do have throw rugs, attach them to the floor with carpet tape.  Make sure that you have a light switch at the top of the stairs and the bottom of the stairs. If you do not have them, ask someone to add them for you. What else can I do to help prevent falls?  Wear shoes that: ? Do not have high heels. ? Have rubber bottoms. ? Are comfortable and fit you well. ? Are closed at the toe. Do not wear sandals.  If you use a stepladder: ? Make sure that it is fully opened. Do not climb a closed stepladder. ? Make sure that both sides of the stepladder are locked into place. ? Ask someone to hold it for you, if possible.  Clearly mark and make sure that you can see: ? Any grab bars or handrails. ? First and last steps. ? Where the edge of each step is.  Use tools that help you move around (mobility aids) if they are needed. These include: ? Canes. ? Walkers. ? Scooters. ? Crutches.  Turn on the  lights when you go into a dark area. Replace any light bulbs as soon as they burn out.  Set up your furniture so you have a clear path. Avoid moving your furniture around.  If any of your floors are uneven, fix them.  If there are any pets around you, be aware of where they are.  Review your medicines with your doctor. Some medicines can make you feel dizzy. This can increase your chance of falling. Ask your doctor what other things that you can do to help prevent falls. This information is not intended to replace advice given to you by your health care provider. Make sure you discuss any questions you have with your health care provider. Document Released: 06/16/2009 Document Revised: 01/26/2016 Document Reviewed: 09/24/2014 Elsevier Interactive Patient Education  2018 [employer].  Follow with Primary MD  [name], MD  and other consultant's as instructed your Hospitalist MD  Please get a complete blood count and chemistry panel checked by your Primary MD at your next visit, and again as instructed by your Primary MD.  Get Medicines reviewed and adjusted: Please take all your medications with you for your next visit with your Primary MD  Laboratory/radiological data: Please request your Primary MD to go over all hospital tests and procedure/radiological results at the follow up, please ask your Primary MD to get all Hospital records sent to his/her office.  In some cases, they will be blood work, cultures and biopsy results pending at the time of your discharge. Please request that your primary care M.D. follows up on these results.  Also Note the following: If you experience worsening of your admission symptoms, develop shortness of breath, life threatening emergency, suicidal or homicidal thoughts you must seek medical attention immediately by calling 911 or calling your MD immediately  if symptoms less severe.  You must read complete instructions/literature along with all  the possible adverse reactions/side effects for all the Medicines you take and that have been prescribed to you. Take any new Medicines after you have completely understood and accpet all the possible adverse reactions/side effects.   Do not drive when taking Pain medications or sleeping medications (Benzodaizepines)  Do not take more than prescribed Pain, Sleep and Anxiety Medications. It is not advisable to combine anxiety,sleep and pain medications without talking with your primary care practitioner  Special Instructions: If you have smoked or chewed Tobacco  in the last 2 yrs please stop smoking, stop any regular Alcohol  and or any Recreational drug use.  Wear Seat belts while driving.  Please note: You were cared for by a hospitalist during your hospital stay. Once you are discharged, your primary care physician will handle any further medical issues. Please note that NO REFILLS for any discharge medications will be authorized once you are discharged, as it is imperative that you return to your primary care physician (or establish a relationship with a primary care physician if you do not have one) for your post hospital discharge needs so that they can reassess your need for medications and monitor your lab values.

## 2018-04-29 DIAGNOSIS — I482 Chronic atrial fibrillation: Secondary | ICD-10-CM | POA: Diagnosis not present

## 2018-04-29 DIAGNOSIS — I110 Hypertensive heart disease with heart failure: Secondary | ICD-10-CM | POA: Diagnosis not present

## 2018-04-29 DIAGNOSIS — E669 Obesity, unspecified: Secondary | ICD-10-CM | POA: Diagnosis not present

## 2018-04-29 DIAGNOSIS — Z6830 Body mass index (BMI) 30.0-30.9, adult: Secondary | ICD-10-CM | POA: Diagnosis not present

## 2018-04-29 DIAGNOSIS — I2510 Atherosclerotic heart disease of native coronary artery without angina pectoris: Secondary | ICD-10-CM | POA: Diagnosis not present

## 2018-04-29 DIAGNOSIS — E785 Hyperlipidemia, unspecified: Secondary | ICD-10-CM | POA: Diagnosis not present

## 2018-04-29 DIAGNOSIS — G629 Polyneuropathy, unspecified: Secondary | ICD-10-CM | POA: Diagnosis not present

## 2018-04-29 DIAGNOSIS — I509 Heart failure, unspecified: Secondary | ICD-10-CM | POA: Diagnosis not present

## 2018-04-29 DIAGNOSIS — I739 Peripheral vascular disease, unspecified: Secondary | ICD-10-CM | POA: Diagnosis not present

## 2018-04-29 DIAGNOSIS — S72111D Displaced fracture of greater trochanter of right femur, subsequent encounter for closed fracture with routine healing: Secondary | ICD-10-CM | POA: Diagnosis not present

## 2018-04-29 LAB — URINE CULTURE: Culture: >=100000 — AB

## 2018-04-30 ENCOUNTER — Telehealth: Payer: Self-pay | Admitting: Family Medicine

## 2018-04-30 NOTE — Telephone Encounter (Signed)
Erline Levine with [facility] calling to make sure Dr. Richardson Landry is ok with the plan of care she has set up for Leonard Cisneros. She would like to see him for PT 2 times a week for 3 weeks. CB# [phone].

## 2018-04-30 NOTE — Telephone Encounter (Signed)
Per Dr.Steve; yes that is fine. Left message for Erline Levine to return call

## 2018-05-02 DIAGNOSIS — E785 Hyperlipidemia, unspecified: Secondary | ICD-10-CM | POA: Diagnosis not present

## 2018-05-02 DIAGNOSIS — Z6830 Body mass index (BMI) 30.0-30.9, adult: Secondary | ICD-10-CM | POA: Diagnosis not present

## 2018-05-02 DIAGNOSIS — G629 Polyneuropathy, unspecified: Secondary | ICD-10-CM | POA: Diagnosis not present

## 2018-05-02 DIAGNOSIS — I739 Peripheral vascular disease, unspecified: Secondary | ICD-10-CM | POA: Diagnosis not present

## 2018-05-02 DIAGNOSIS — S72111D Displaced fracture of greater trochanter of right femur, subsequent encounter for closed fracture with routine healing: Secondary | ICD-10-CM | POA: Diagnosis not present

## 2018-05-02 DIAGNOSIS — I110 Hypertensive heart disease with heart failure: Secondary | ICD-10-CM | POA: Diagnosis not present

## 2018-05-02 DIAGNOSIS — I2510 Atherosclerotic heart disease of native coronary artery without angina pectoris: Secondary | ICD-10-CM | POA: Diagnosis not present

## 2018-05-02 DIAGNOSIS — E669 Obesity, unspecified: Secondary | ICD-10-CM | POA: Diagnosis not present

## 2018-05-02 DIAGNOSIS — I509 Heart failure, unspecified: Secondary | ICD-10-CM | POA: Diagnosis not present

## 2018-05-02 DIAGNOSIS — I482 Chronic atrial fibrillation: Secondary | ICD-10-CM | POA: Diagnosis not present

## 2018-05-02 MED ORDER — OXYCODONE-ACETAMINOPHEN 5-325 MG PO TABS: ORAL_TABLET | ORAL | 0 refills | Status: DC

## 2018-05-02 NOTE — Telephone Encounter (Signed)
Thirty six one q four to six prn pain

## 2018-05-02 NOTE — Telephone Encounter (Signed)
AFO- an ankle foot orthosis intended to control the position and motion of the ankle while compensating for weakness or correcting deformities

## 2018-05-02 NOTE — Telephone Encounter (Signed)
Per Leonard Cisneros pt is seeing you Wednesday and she gave him a note to bring to you stating pt had right drop foot. She would like to know if you could write a rx for a right afo. Please advise.

## 2018-05-02 NOTE — Telephone Encounter (Signed)
sure

## 2018-05-02 NOTE — Telephone Encounter (Addendum)
Patient is requesting a refill of Percocet 5/325- Patient was given Percocet 5/325 #20 from hospitalist on discharge 04/28/18 and stated he will run out of med today and wondered if Dr Richardson Landry could write a new script.   Patient to get script for AFO at follow up office visit Wednesday.

## 2018-05-02 NOTE — Telephone Encounter (Signed)
I called and left a message for Leonard Cisneros to r/c.

## 2018-05-02 NOTE — Telephone Encounter (Signed)
What is an afo?

## 2018-05-02 NOTE — Telephone Encounter (Signed)
Prescription upfront for pick up. Patient notified. 

## 2018-05-02 NOTE — Telephone Encounter (Signed)
Leonard Cisneros called back and is aware of all.

## 2018-05-07 ENCOUNTER — Ambulatory Visit (INDEPENDENT_AMBULATORY_CARE_PROVIDER_SITE_OTHER): Payer: Medicare HMO | Admitting: Family Medicine

## 2018-05-07 ENCOUNTER — Encounter: Payer: Self-pay | Admitting: Family Medicine

## 2018-05-07 VITALS — BP 114/74 | Ht 71.0 in | Wt 226.0 lb

## 2018-05-07 DIAGNOSIS — M5430 Sciatica, unspecified side: Secondary | ICD-10-CM

## 2018-05-07 DIAGNOSIS — M48062 Spinal stenosis, lumbar region with neurogenic claudication: Secondary | ICD-10-CM

## 2018-05-07 NOTE — Progress Notes (Signed)
   Subjective:    Patient ID: Leonard Cisneros, male    DOB: [birth year], 77 y.o.   MRN: [id number]  HPI Patient arrives for a follow up on a recent hospitalization for a fall.  took a fall and fx greater trochanter  Happened at the [place], developed severe pain in the right hip and leg  w u revealed trochanteric fracture  Also went on and got an  Mri in the [place] spine   Pt already doing physical therapy and its helping some   Taking three pain tabs per day  Patient continues to have substantial pain.  Sharp in nature.  Worse with movement.  Hip pain has improved somewhat.  Please see prior notes.  Patient also complicated by substantial neurogenic pain.  He was in the process of getting an MRI when this fall and hip fracture occurred.  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         Review of Systems No headache, no major weight loss or weight gain, no chest pain  abdominal pain no change in bowel habits complete ROS otherwise negative     Objective:   Physical Exam  Alert and oriented, vitals reviewed and stable, NAD ENT-TM's and ext canals WNL bilat via otoscopic exam Soft palate, tonsils and post pharynx WNL via oropharyngeal exam Neck-symmetric, no masses; thyroid nonpalpable and nontender Pulmonary-no tachypnea or accessory muscle use; Clear without wheezes via auscultation Card--no abnrml murmurs, rhythm reg and rate WNL Carotid pulses symmetric, without bruits Right hip distinct tenderness to palpation./Positive straight leg raise.     Assessment & Plan:  Impression 1 status post greater trochanteric fracture.  Petra Kuba of this discussed with patient.  Picture drawn  2.  Continued pain secondary to #1 and #3.  Further pain medicine written.  Appropriate use discussed.  3.  Compression L5-S1.  Distinct substantial and now a couple months in duration time per neurosurgery  referral discussed  Greater than 50% of this 25 minute face to face visit was spent in counseling and discussion and coordination of care regarding the above diagnosis/diagnosies

## 2018-05-09 DIAGNOSIS — M79651 Pain in right thigh: Secondary | ICD-10-CM | POA: Diagnosis not present

## 2018-05-09 DIAGNOSIS — Z6829 Body mass index (BMI) 29.0-29.9, adult: Secondary | ICD-10-CM | POA: Diagnosis not present

## 2018-05-13 ENCOUNTER — Other Ambulatory Visit: Payer: Self-pay

## 2018-05-13 ENCOUNTER — Telehealth: Payer: Self-pay | Admitting: Family Medicine

## 2018-05-13 MED ORDER — OXYCODONE-ACETAMINOPHEN 5-325 MG PO TABS: ORAL_TABLET | ORAL | 0 refills | Status: DC

## 2018-05-13 NOTE — Telephone Encounter (Signed)
Print script; awaiting signature. Pt notified and states that his wife will pick the script up in the morning due to him not being able to drive. Script up front

## 2018-05-13 NOTE — Telephone Encounter (Signed)
Wants refill on pain meds.  Was just here on 05-07-18.

## 2018-05-13 NOTE — Telephone Encounter (Signed)
Oxycodone 5/325 #36 given 05/02/18

## 2018-05-13 NOTE — Telephone Encounter (Signed)
Repeat 36 try not to exceed four per day

## 2018-05-19 ENCOUNTER — Ambulatory Visit: Payer: Medicare HMO | Admitting: Orthopedic Surgery

## 2018-05-19 ENCOUNTER — Ambulatory Visit (INDEPENDENT_AMBULATORY_CARE_PROVIDER_SITE_OTHER): Payer: Medicare HMO

## 2018-05-19 ENCOUNTER — Encounter: Payer: Self-pay | Admitting: Orthopedic Surgery

## 2018-05-19 ENCOUNTER — Telehealth: Payer: Self-pay | Admitting: Family Medicine

## 2018-05-19 VITALS — BP 110/64 | HR 84 | Ht 71.0 in | Wt 211.0 lb

## 2018-05-19 DIAGNOSIS — S72001D Fracture of unspecified part of neck of right femur, subsequent encounter for closed fracture with routine healing: Secondary | ICD-10-CM

## 2018-05-19 DIAGNOSIS — I2510 Atherosclerotic heart disease of native coronary artery without angina pectoris: Secondary | ICD-10-CM | POA: Diagnosis not present

## 2018-05-19 DIAGNOSIS — S72111D Displaced fracture of greater trochanter of right femur, subsequent encounter for closed fracture with routine healing: Secondary | ICD-10-CM | POA: Diagnosis not present

## 2018-05-19 DIAGNOSIS — I509 Heart failure, unspecified: Secondary | ICD-10-CM | POA: Diagnosis not present

## 2018-05-19 DIAGNOSIS — I110 Hypertensive heart disease with heart failure: Secondary | ICD-10-CM | POA: Diagnosis not present

## 2018-05-19 DIAGNOSIS — W01198D Fall on same level from slipping, tripping and stumbling with subsequent striking against other object, subsequent encounter: Secondary | ICD-10-CM | POA: Diagnosis not present

## 2018-05-19 NOTE — Telephone Encounter (Signed)
Patient advised Dr Richardson Landry stated since cardiology prescribed and they are managing needs to talk to cardiology office. Patient verbalized understanding.

## 2018-05-19 NOTE — Telephone Encounter (Signed)
Since card prescribed and they are managing needs to talk to card office

## 2018-05-19 NOTE — Progress Notes (Signed)
Hospital follow-up  Encounter Diagnosis  Name Primary?  . Closed right hip fracture, with routine healing, subsequent encounter Yes   77 year old male had a total hip done by Dr. Salome Holmes with cup revision by Dr[place] Reyn[place].  Came to the hospital unable to walk had x-rays CAT scan ventral bone scan which showed increased uptake in the greater trochanter of the right hip  We treated him for greater trochanteric hip fracture  He is on crutches now he is able to bear weight without pain  He does complain of intermittent pain in his right leg but overall he is much better than he was when he was in the hospital  His x-ray today was negative for fracture as it was in the hospital  His implant looks stable  His exam showed tenderness in the mid thigh region  Recommend continue crutches No driving yet    X-ray again 4 weeks

## 2018-05-19 NOTE — Telephone Encounter (Signed)
Pt is having swelling in bilateral ankles from being in the hospital. Dr. Claiborne Billings has put him hydrochlorothiazide 12.5 mg 1 time a day. Pt is wondering if that should be increased. He ment to ask Dr. Richardson Landry last Thursday during his hospital follow up and forgot. He saw Dr. Aline Brochure today and he suggest calling and seeing if something needs to be changed.

## 2018-05-27 ENCOUNTER — Other Ambulatory Visit: Payer: Self-pay | Admitting: Cardiovascular Disease

## 2018-05-27 DIAGNOSIS — Z7901 Long term (current) use of anticoagulants: Secondary | ICD-10-CM | POA: Diagnosis not present

## 2018-05-27 LAB — COAGUCHEK XS/INR WAIVED
INR: 3.2 — AB (ref 0.9–1.1)
Prothrombin Time: 38.1 s

## 2018-05-27 LAB — POCT INR: INR: 3.2 — AB (ref 2.0–3.0)

## 2018-05-28 ENCOUNTER — Ambulatory Visit (INDEPENDENT_AMBULATORY_CARE_PROVIDER_SITE_OTHER): Payer: Medicare HMO | Admitting: Pharmacist

## 2018-05-28 DIAGNOSIS — I482 Chronic atrial fibrillation, unspecified: Secondary | ICD-10-CM

## 2018-05-28 DIAGNOSIS — Z7901 Long term (current) use of anticoagulants: Secondary | ICD-10-CM | POA: Diagnosis not present

## 2018-06-02 ENCOUNTER — Encounter: Payer: Self-pay | Admitting: Cardiology

## 2018-06-02 ENCOUNTER — Other Ambulatory Visit: Payer: Self-pay | Admitting: Cardiovascular Disease

## 2018-06-02 ENCOUNTER — Ambulatory Visit: Payer: Medicare HMO | Admitting: Cardiology

## 2018-06-02 VITALS — BP 104/64 | HR 91 | Ht 71.0 in | Wt 218.5 lb

## 2018-06-02 DIAGNOSIS — S72114D Nondisplaced fracture of greater trochanter of right femur, subsequent encounter for closed fracture with routine healing: Secondary | ICD-10-CM

## 2018-06-02 DIAGNOSIS — I482 Chronic atrial fibrillation, unspecified: Secondary | ICD-10-CM

## 2018-06-02 DIAGNOSIS — Z9861 Coronary angioplasty status: Secondary | ICD-10-CM | POA: Diagnosis not present

## 2018-06-02 DIAGNOSIS — Z7901 Long term (current) use of anticoagulants: Secondary | ICD-10-CM

## 2018-06-02 DIAGNOSIS — Z951 Presence of aortocoronary bypass graft: Secondary | ICD-10-CM | POA: Diagnosis not present

## 2018-06-02 DIAGNOSIS — I2510 Atherosclerotic heart disease of native coronary artery without angina pectoris: Secondary | ICD-10-CM

## 2018-06-02 DIAGNOSIS — E785 Hyperlipidemia, unspecified: Secondary | ICD-10-CM | POA: Diagnosis not present

## 2018-06-02 DIAGNOSIS — I255 Ischemic cardiomyopathy: Secondary | ICD-10-CM

## 2018-06-02 NOTE — Assessment & Plan Note (Signed)
Status post CABG surgery in 2003, by Dr. Patrice Paradise with a LIMA to the LAD, a LIMA to the distal RCA, and a vein graft to the ramus intermediate vessel.

## 2018-06-02 NOTE — Assessment & Plan Note (Signed)
Coumadin CHADS VASC=4

## 2018-06-02 NOTE — Assessment & Plan Note (Signed)
LDL was 73 Sept 2018- he is not on a statin now, not sure why. I have ordered fasting lipids and will make recommendations based on that.

## 2018-06-02 NOTE — Patient Instructions (Addendum)
Medication Instructions:   Your physician recommends that you continue on your current medications as directed. Please refer to the Current Medication list given to you today.  Labwork:  Your physician recommends that you return for a FASTING lipid profile as soon as possible  Testing/Procedures:  NONE  Follow-Up:  Your physician recommends that you schedule a follow-up appointment in: 1 year with Dr. Claiborne Billings. You will receive a reminder letter in the mail in about 10 months reminding you to call and schedule your appointment. If you don't receive this letter, please contact our office.  Any Other Special Instructions Will Be Listed Below (If Applicable).  If you need a refill on your cardiac medications before your next appointment, please call your pharmacy.

## 2018-06-02 NOTE — Assessment & Plan Note (Signed)
CVR- asymptomatic

## 2018-06-02 NOTE — Assessment & Plan Note (Signed)
SVG-RI PCI with BMS May 2017

## 2018-06-02 NOTE — Progress Notes (Signed)
06/02/2018 Leonard Cisneros   [birth year]  [id number]  Primary Physician [name], MD Primary Cardiologist: Dr Claiborne Billings  HPI:  Pleasant 77 y/o male from [place] followed by Dr Claiborne Billings with a history of CAD.and CAF The patient is s/p CABG x 3 in 2003 and subsequently had an SVG-RI PCI with a BMS May 2017. He had low normal LVF at cath. He has CAF and is on Coumadin. His LOV with Dr Claiborne Billings was in Sept 2018.   The pt was at his house at [facility] in August this year when he fell and suffered a non displaced hip fracture. He was admitted to [facility] and has been treated conservatively. He is recovering from his injury but it "slow going". He denies any chest pain or dyspnea. His atrial fibrillation doesn't bother him. In reviewing his medications it became apparent that his is not on a statin. He knows he took Lipitor in the past but is not sure if he takes it now and doesn't know if or why it was stopped.    Current Outpatient Medications  Medication Sig Dispense Refill  . aspirin EC 81 MG tablet Take 1 tablet (81 mg total) by mouth daily. 90 tablet 3  . diltiazem (DILACOR XR) 240 MG 24 hr capsule Take 1 capsule (240 mg total) by mouth daily. KEEP OV. 90 capsule 3  . isosorbide mononitrate (IMDUR) 30 MG 24 hr tablet TAKE 1 TABLET BY MOUTH EVERY DAY 30 tablet 9  . methocarbamol (ROBAXIN) 500 MG tablet Take 1 tablet (500 mg total) by mouth every 8 (eight) hours as needed for muscle spasms. 12 tablet 0  . metoprolol succinate (TOPROL-XL) 50 MG 24 hr tablet TAKE 1 & 1/2 TABLETS BY MOUTH EVERY DAY 90 tablet 2  . nitroGLYCERIN (NITROSTAT) 0.4 MG SL tablet DISSOLVE 1 TABLET UNDER THE TONGUE EVERY 5 MINUTES UP TO 15 MINUTES FOR CHEST PAIN 25 tablet 0  . oxyCODONE-acetaminophen (PERCOCET/ROXICET) 5-325 MG tablet One tablet every 4-6 hours as needed for pain 36 tablet 0  . ramipril (ALTACE) 2.5 MG capsule TAKE ONE CAPSULE BY MOUTH EVERY DAY 90 capsule 2  . warfarin (COUMADIN) 7.5 MG tablet TAKE 1/2  TO 1 TABLET BY MOUTH DAILY AS DIRECTED (Patient taking differently: Take 3.75-7.5 mg by mouth See admin instructions. Take 3.75 mg by mouth daily on Monday and Friday. Take 7.5 mg by mouth daily on all other days) 30 tablet 3   No current facility-administered medications for this visit.     Allergies  Allergen Reactions  . Augmentin [Amoxicillin-Pot Clavulanate] Nausea And Vomiting and Other (See Comments)    Has patient had a PCN reaction causing immediate rash, facial/tongue/throat swelling, SOB or lightheadedness with hypotension: Unknown Has patient had a PCN reaction causing severe rash involving mucus membranes or skin necrosis: No Has patient had a PCN reaction that required hospitalization: No Has patient had a PCN reaction occurring within the last 10 years: No If all of the above answers are "NO", then may proceed with Cephalosporin use.     Past Medical History:  Diagnosis Date  . Atrial flutter ([place])    a. s/p remote ablation by Dr. [name].  [name] CAD (coronary artery disease)    a. s/p CABG 2003. b. 01/2016: unstable angina - s/p BMS to SVG-ramus intermediate, patent LIMA-mLAD, patent RIMA-dRCA.   [name] CHF (congestive heart failure) ([place])   . Chronic atrial fibrillation (HCC)    a. initially post op CABG then chronic. On  Coumadin.  . Chronic lower back pain   . Essential hypertension   . High cholesterol   . Ischemic cardiomyopathy    a. Cath 01/2016 -  EF 50-55% with mild distal inferior hypocontractility.  [name] Unspecified hereditary and idiopathic peripheral neuropathy 04/04/2014    Social History   Socioeconomic History  . Marital status: Married    Spouse name: Not on file  . Number of children: 2  . Years of education: Not on file  . Highest education level: Not on file  Occupational History  . 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 Financial resource strain: Not hard at all  . Food insecurity:    Worry: Never true     Inability: Never true  . Transportation needs:    Medical: No    Non-medical: No  Tobacco Use  . Smoking status: Former Smoker    Packs/day: 0.50    Years: 2.00    Pack years: 1.00    Types: Cigarettes    Last attempt to quit: 09/03/1966    Years since quitting: 51.7  . Smokeless tobacco: Never Used  Substance and Sexual Activity  . Alcohol use: Yes    Alcohol/week: 11.0 standard drinks    Types: 7 Glasses of wine, 4 Cans of beer per week    Comment: a glass of wine with dinner/ beer on the weekend  . Drug use: No  . Sexual activity: Yes  Lifestyle  . Physical activity:    Days per week: Not on file    Minutes per session: Not on file  . Stress: To some extent  Relationships  . Social connections:    Talks on phone: More than three times a week    Gets together: Three times a week    Attends religious service: Patient refused    Active member of club or organization: Patient refused    Attends meetings of clubs or organizations: Patient refused    Relationship status: Married  . Intimate partner violence:    Fear of current or ex partner: No    Emotionally abused: No    Physically abused: No    Forced sexual activity: No  Other Topics Concern  . Not on file  Social History Narrative  . Not on file     Family History  Problem Relation Age of Onset  . Heart attack Brother   . Colon cancer Neg Hx      Review of Systems: General: negative for chills, fever, night sweats or weight changes.  Cardiovascular: negative for chest pain, dyspnea on exertion, edema, orthopnea, palpitations, paroxysmal nocturnal dyspnea or shortness of breath Dermatological: negative for rash Respiratory: negative for cough or wheezing Urologic: negative for hematuria Abdominal: negative for nausea, vomiting, diarrhea, bright red blood per rectum, melena, or hematemesis Neurologic: negative for visual changes, syncope, or dizziness All other systems reviewed and are otherwise negative  except as noted above.    Blood pressure 104/64, pulse 91, height 5\' 11"  (1.803 m), weight 218 lb 8 oz (99.1 kg), SpO2 97 %.  General appearance: alert, cooperative and no distress Neck: no carotid bruit and no JVD Lungs: clear to auscultation bilaterally Heart: irregularly irregular rhythm and 2/6 systolic murmur AOV and LSB Extremities: trace edema Skin: warm and dry Neurologic: Grossly normal  EKG AF with CVR  ASSESSMENT AND PLAN:   Closed fracture of greater trochanter of right femur with routine healing Conservative therapy- Aug 2019  Hx of CABG Status post CABG surgery in 2003, by Dr. Patrice Paradise with a LIMA to the LAD, a LIMA to the distal RCA, and a vein graft to the ramus intermediate vessel.  CAD S/P percutaneous coronary angioplasty SVG-RI PCI with BMS May 2017  Chronic atrial fibrillation (HCC) CVR- asymptomatic  Long term (current) use of anticoagulants Coumadin CHADS VASC=4  Ischemic cardiomyopathy EF low normal at cath May 2017  Hyperlipidemia LDL goal <70 LDL was 73 Sept 2018- he is not on a statin now, not sure why. I have ordered fasting lipids and will make recommendations based on that.    PLAN  Check lipids, otherwise continue current medical Rx and f/u with Dr Claiborne Billings in a year.   Kerin Ransom PA-C 06/02/2018 9:30 AM

## 2018-06-02 NOTE — Assessment & Plan Note (Signed)
EF low normal at cath May 2017

## 2018-06-02 NOTE — Assessment & Plan Note (Signed)
Conservative therapy- Aug 2019

## 2018-06-03 DIAGNOSIS — E785 Hyperlipidemia, unspecified: Secondary | ICD-10-CM | POA: Diagnosis not present

## 2018-06-03 LAB — LIPID PANEL
Chol/HDL Ratio: 2.4 ratio (ref 0.0–5.0)
Cholesterol, Total: 187 mg/dL (ref 100–199)
HDL: 77 mg/dL (ref >39)
LDL Calculated: 95 mg/dL (ref 0–99)
Triglycerides: 77 mg/dL (ref 0–149)
VLDL Cholesterol Cal: 15 mg/dL (ref 5–40)

## 2018-06-05 ENCOUNTER — Other Ambulatory Visit: Payer: Self-pay

## 2018-06-05 DIAGNOSIS — Z79899 Other long term (current) drug therapy: Secondary | ICD-10-CM

## 2018-06-05 DIAGNOSIS — E785 Hyperlipidemia, unspecified: Secondary | ICD-10-CM

## 2018-06-05 MED ORDER — ATORVASTATIN CALCIUM 80 MG PO TABS: 80.0000 mg | ORAL_TABLET | Freq: Every day | ORAL | 2 refills | Status: DC

## 2018-06-16 ENCOUNTER — Ambulatory Visit (INDEPENDENT_AMBULATORY_CARE_PROVIDER_SITE_OTHER): Payer: Medicare HMO

## 2018-06-16 ENCOUNTER — Ambulatory Visit (INDEPENDENT_AMBULATORY_CARE_PROVIDER_SITE_OTHER): Payer: Medicare HMO | Admitting: Orthopedic Surgery

## 2018-06-16 VITALS — BP 140/74 | HR 106 | Ht 71.0 in | Wt 218.0 lb

## 2018-06-16 DIAGNOSIS — S72114D Nondisplaced fracture of greater trochanter of right femur, subsequent encounter for closed fracture with routine healing: Secondary | ICD-10-CM

## 2018-06-16 NOTE — Patient Instructions (Signed)
Call our office on the 31st to alert US of the findings at [facility] orthopedics/[facility]

## 2018-06-16 NOTE — Progress Notes (Signed)
Fracture care follow-up  Periprosthetic fracture right hip implant  Chief Complaint  Patient presents with  . Follow-up    Recheck on right hip fracture, DOI 04-20-18.    He is now almost 2 months after the fall onto his right leg with periprosthetic fracture of the right hip prosthesis along the femoral stem  He is not improving  He complains of start up pain which seems to be relieved with weightbearing  His x-rays today again show the periprosthetic fracture in the cortex on the lateral femur near the tip but there is subtle radiolucency around the proximal part of the prosthesis  I suggested that he see Dr. Ricki Rodriguez or Dr. Alvan Dame for evaluation of the implant and he says he already made an appointment for 30 October  Today's examination show mild tenderness to palpation in the mid thigh region no pain with flexion extension of the right hip  He will see Dr. Reynaldo Minium on the 30th and then call us on the 31st to see if we need to see him again  Currently on crutches, continue

## 2018-06-17 ENCOUNTER — Other Ambulatory Visit: Payer: Self-pay | Admitting: Cardiovascular Disease

## 2018-07-02 ENCOUNTER — Other Ambulatory Visit: Payer: Self-pay | Admitting: *Deleted

## 2018-07-02 DIAGNOSIS — Z96643 Presence of artificial hip joint, bilateral: Secondary | ICD-10-CM | POA: Diagnosis not present

## 2018-07-02 DIAGNOSIS — S72111A Displaced fracture of greater trochanter of right femur, initial encounter for closed fracture: Secondary | ICD-10-CM | POA: Diagnosis not present

## 2018-07-02 DIAGNOSIS — M25551 Pain in right hip: Secondary | ICD-10-CM | POA: Diagnosis not present

## 2018-07-02 MED ORDER — DILTIAZEM HCL ER 240 MG PO CP24: 240.0000 mg | ORAL_CAPSULE | Freq: Every day | ORAL | 1 refills | Status: DC

## 2018-07-09 DIAGNOSIS — S72111A Displaced fracture of greater trochanter of right femur, initial encounter for closed fracture: Secondary | ICD-10-CM | POA: Diagnosis not present

## 2018-07-16 DIAGNOSIS — H25812 Combined forms of age-related cataract, left eye: Secondary | ICD-10-CM | POA: Diagnosis not present

## 2018-07-20 DIAGNOSIS — R69 Illness, unspecified: Secondary | ICD-10-CM | POA: Diagnosis not present

## 2018-07-23 ENCOUNTER — Other Ambulatory Visit: Payer: Self-pay | Admitting: Cardiovascular Disease

## 2018-07-23 DIAGNOSIS — Z7901 Long term (current) use of anticoagulants: Secondary | ICD-10-CM | POA: Diagnosis not present

## 2018-07-23 LAB — COAGUCHEK XS/INR WAIVED
INR: 1.8 — ABNORMAL HIGH (ref 0.9–1.1)
PROTHROMBIN TIME: 21.3 s

## 2018-07-23 LAB — PROTIME-INR: INR: 1.8 — AB (ref <=1.1)

## 2018-07-24 ENCOUNTER — Ambulatory Visit (INDEPENDENT_AMBULATORY_CARE_PROVIDER_SITE_OTHER): Payer: Medicare HMO | Admitting: Pharmacist

## 2018-07-24 DIAGNOSIS — I482 Chronic atrial fibrillation, unspecified: Secondary | ICD-10-CM | POA: Diagnosis not present

## 2018-07-24 DIAGNOSIS — Z7901 Long term (current) use of anticoagulants: Secondary | ICD-10-CM

## 2018-07-28 ENCOUNTER — Encounter: Payer: Self-pay | Admitting: Pharmacist Clinician (PhC)/ Clinical Pharmacy Specialist

## 2018-07-28 DIAGNOSIS — Z7901 Long term (current) use of anticoagulants: Secondary | ICD-10-CM

## 2018-07-28 DIAGNOSIS — I482 Chronic atrial fibrillation, unspecified: Secondary | ICD-10-CM

## 2018-07-28 NOTE — Progress Notes (Signed)
This encounter was created in error - please disregard.

## 2018-08-18 ENCOUNTER — Other Ambulatory Visit: Payer: Self-pay | Admitting: Cardiovascular Disease

## 2018-08-22 DIAGNOSIS — S72111D Displaced fracture of greater trochanter of right femur, subsequent encounter for closed fracture with routine healing: Secondary | ICD-10-CM | POA: Diagnosis not present

## 2018-08-25 ENCOUNTER — Other Ambulatory Visit: Payer: Self-pay | Admitting: Cardiovascular Disease

## 2018-09-17 ENCOUNTER — Other Ambulatory Visit: Payer: Self-pay | Admitting: Cardiovascular Disease

## 2018-09-17 ENCOUNTER — Telehealth: Payer: Self-pay

## 2018-09-17 DIAGNOSIS — Z7901 Long term (current) use of anticoagulants: Secondary | ICD-10-CM | POA: Diagnosis not present

## 2018-09-17 LAB — COAGUCHEK XS/INR WAIVED
INR: 2.2 — ABNORMAL HIGH (ref 0.9–1.1)
Prothrombin Time: 25.9 s

## 2018-09-17 NOTE — Telephone Encounter (Signed)
Called pt for overdue inr but pt stated that they had it drawn elsewhere and will be faxed to [place] today

## 2018-09-18 ENCOUNTER — Ambulatory Visit (INDEPENDENT_AMBULATORY_CARE_PROVIDER_SITE_OTHER): Payer: Medicare HMO | Admitting: Pharmacist

## 2018-09-18 DIAGNOSIS — Z7901 Long term (current) use of anticoagulants: Secondary | ICD-10-CM | POA: Diagnosis not present

## 2018-09-18 DIAGNOSIS — I482 Chronic atrial fibrillation, unspecified: Secondary | ICD-10-CM

## 2018-09-29 ENCOUNTER — Telehealth: Payer: Self-pay

## 2018-09-29 NOTE — Telephone Encounter (Signed)
Patient called today to report he has had bilateral knee and calve edema on going since January 17,2020. He states there is no pain, no redness, no tenderness,no shortness of breath. He is not on a diuretic. Per Dr. Mickie Hillier, pt can have an appointment tomorrow. Switched up front to set up.

## 2018-09-30 ENCOUNTER — Ambulatory Visit (INDEPENDENT_AMBULATORY_CARE_PROVIDER_SITE_OTHER): Payer: Medicare HMO | Admitting: Family Medicine

## 2018-09-30 ENCOUNTER — Other Ambulatory Visit: Payer: Self-pay | Admitting: Family Medicine

## 2018-09-30 ENCOUNTER — Encounter: Payer: Self-pay | Admitting: Family Medicine

## 2018-09-30 ENCOUNTER — Other Ambulatory Visit (HOSPITAL_COMMUNITY)
Admission: RE | Admit: 2018-09-30 | Discharge: 2018-09-30 | Disposition: A | Discharge status: Home / Self Care | Payer: Medicare HMO | Source: Ambulatory Visit | Attending: Family Medicine | Admitting: Family Medicine

## 2018-09-30 ENCOUNTER — Ambulatory Visit (HOSPITAL_COMMUNITY)
Admission: RE | Admit: 2018-09-30 | Discharge: 2018-09-30 | Disposition: A | Discharge status: Home / Self Care | Payer: Medicare HMO | Source: Ambulatory Visit | Attending: Family Medicine | Admitting: Family Medicine

## 2018-09-30 VITALS — BP 138/88 | Ht 71.0 in

## 2018-09-30 DIAGNOSIS — I878 Other specified disorders of veins: Secondary | ICD-10-CM | POA: Diagnosis not present

## 2018-09-30 DIAGNOSIS — R600 Localized edema: Secondary | ICD-10-CM

## 2018-09-30 LAB — BASIC METABOLIC PANEL
Anion gap: 8 (ref 5–15)
BUN: 12 mg/dL (ref 8–23)
CO2: 22 mmol/L (ref 22–32)
Calcium: 9.1 mg/dL (ref 8.9–10.3)
Chloride: 107 mmol/L (ref 98–111)
Creatinine, Ser: 0.77 mg/dL (ref 0.61–1.24)
GFR calc non Af Amer: >60 mL/min (ref >60)
Glucose, Bld: 107 mg/dL — ABNORMAL HIGH (ref 70–99)
Potassium: 4.2 mmol/L (ref 3.5–5.1)
Sodium: 137 mmol/L (ref 135–145)

## 2018-09-30 LAB — BRAIN NATRIURETIC PEPTIDE: B Natriuretic Peptide: 123 pg/mL — ABNORMAL HIGH (ref 0.0–100.0)

## 2018-09-30 LAB — D-DIMER, QUANTITATIVE: D-Dimer, Quant: 1 ug/mL-FEU — ABNORMAL HIGH (ref 0.00–0.50)

## 2018-09-30 MED ORDER — FUROSEMIDE 20 MG PO TABS: 20.0000 mg | ORAL_TABLET | Freq: Every day | ORAL | 5 refills | Status: DC

## 2018-09-30 NOTE — Progress Notes (Signed)
   Subjective:    Patient ID: Leonard Cisneros, male    DOB: [birth year], 78 y.o.   MRN: [id number]  HPI  Patient arrives with swelling in both of his legs for 10-14 days. Patient is currently dealing with stress fracture that has him on crutches as well.  Pt had hair line fx with right hip  Then developed stress fx in right thigh, now on crutches    Therefore not walking much these days.  Also has had some change in therapy.  See prior notes.  See cardiologist note.  History of chronic atrial fibrillation.  On chronic anticoagulation.  Claims compliance.  Also history of cardiomyopathy                          Review of Systems No headache, no major weight loss or weight gain, no chest pain no back pain abdominal pain no change in bowel habits complete ROS otherwise negative     Objective:   Physical Exam  Alert and oriented, vitals reviewed and stable, NAD ENT-TM's and ext canals WNL bilat via otoscopic exam Soft palate, tonsils and post pharynx WNL via oropharyngeal exam Neck-symmetric, no masses; thyroid nonpalpable and nontender Pulmonary-no tachypnea or accessory muscle use; Clear without wheezes via auscultation Card--no abnrml murmurs, rhythm not reg and rate WNL Carotid pulses symmetric, without bruits Ankles 1-2+ bilateral edema.  Old right venous harvest scar evident.  Impression probable     Assessment & Plan:  Impression probable worsening venous stasis.  However patient does have some risk factors.  With history of cardiomyopathy and atrial fibrillation recommend further work-up.  Sent for stat ultrasound.  BNP and d-dimer.  Further recommendations based on results  Greater than 50% of this 25 minute face to face visit was spent in counseling and discussion and coordination of care regarding the above diagnosis/diagnosies

## 2018-10-03 DIAGNOSIS — S72111D Displaced fracture of greater trochanter of right femur, subsequent encounter for closed fracture with routine healing: Secondary | ICD-10-CM | POA: Diagnosis not present

## 2018-10-06 ENCOUNTER — Ambulatory Visit (HOSPITAL_COMMUNITY)
Admission: RE | Admit: 2018-10-06 | Discharge: 2018-10-06 | Disposition: A | Discharge status: Home / Self Care | Payer: Medicare HMO | Source: Ambulatory Visit | Attending: Family Medicine | Admitting: Family Medicine

## 2018-10-06 DIAGNOSIS — R600 Localized edema: Secondary | ICD-10-CM | POA: Insufficient documentation

## 2018-10-07 ENCOUNTER — Telehealth: Payer: Self-pay | Admitting: Cardiovascular Disease

## 2018-10-07 ENCOUNTER — Other Ambulatory Visit: Payer: Self-pay

## 2018-10-07 MED ORDER — WARFARIN SODIUM 7.5 MG PO TABS: ORAL_TABLET | ORAL | 0 refills | Status: DC

## 2018-10-07 NOTE — Telephone Encounter (Signed)
New Message    *STAT* If patient is at the pharmacy, call can be transferred to refill team.   1. Which medications need to be refilled? (please list name of each medication and dose if known) Warfarin 7.5mg   2. Which pharmacy/location (including street and city if local pharmacy) is medication to be sent to? [name] [place]  3. Do they need a 30 day or 90 day supply? 90 day supply

## 2018-10-14 ENCOUNTER — Other Ambulatory Visit: Payer: Self-pay | Admitting: Cardiovascular Disease

## 2018-10-14 LAB — COAGUCHEK XS/INR WAIVED
INR: 2.3 — AB (ref 0.9–1.1)
Prothrombin Time: 27.3 s

## 2018-10-15 LAB — BASIC METABOLIC PANEL
BUN/Creatinine Ratio: 19 (ref 10–24)
BUN: 17 mg/dL (ref 8–27)
CALCIUM: 9.4 mg/dL (ref 8.6–10.2)
CO2: 22 mmol/L (ref 20–29)
Chloride: 99 mmol/L (ref 96–106)
Creatinine, Ser: 0.91 mg/dL (ref 0.76–1.27)
GFR calc Af Amer: 94 mL/min/{1.73_m2} (ref >59)
GFR calc non Af Amer: 81 mL/min/{1.73_m2} (ref >59)
Glucose: 105 mg/dL — ABNORMAL HIGH (ref 65–99)
Potassium: 4.8 mmol/L (ref 3.5–5.2)
Sodium: 140 mmol/L (ref 134–144)

## 2018-10-20 ENCOUNTER — Telehealth: Payer: Self-pay | Admitting: Cardiovascular Disease

## 2018-10-20 ENCOUNTER — Ambulatory Visit (INDEPENDENT_AMBULATORY_CARE_PROVIDER_SITE_OTHER): Payer: Medicare HMO | Admitting: Pharmacist Clinician (PhC)/ Clinical Pharmacy Specialist

## 2018-10-20 DIAGNOSIS — I482 Chronic atrial fibrillation, unspecified: Secondary | ICD-10-CM

## 2018-10-20 DIAGNOSIS — Z7901 Long term (current) use of anticoagulants: Secondary | ICD-10-CM | POA: Diagnosis not present

## 2018-10-20 DIAGNOSIS — H25813 Combined forms of age-related cataract, bilateral: Secondary | ICD-10-CM | POA: Diagnosis not present

## 2018-10-20 NOTE — Telephone Encounter (Signed)
-----   Message from Rockne Menghini, [place] sent at 10/20/2018  1:23 PM EST ----- Mr. Shiner is having some concerns and needs to be followed up with his provider.  Can you call and get him on Dr. Evette Georges schedule sometime this spring?  Thanks,  Erasmo Downer

## 2018-10-20 NOTE — Telephone Encounter (Signed)
Patient would like to switch from Dr. Claiborne Billings to Dr. Bronson Ing, as he lives closer to the [place] office.  Is this ok with both of you?

## 2018-10-22 ENCOUNTER — Other Ambulatory Visit: Payer: Self-pay

## 2018-10-22 ENCOUNTER — Emergency Department (HOSPITAL_COMMUNITY): Payer: Medicare HMO

## 2018-10-22 ENCOUNTER — Encounter (HOSPITAL_COMMUNITY): Payer: Self-pay

## 2018-10-22 ENCOUNTER — Observation Stay (HOSPITAL_COMMUNITY)
Admission: EM | Admit: 2018-10-22 | Discharge: 2018-10-23 | Disposition: A | Discharge status: Home / Self Care | Payer: Medicare HMO | Attending: Family Medicine | Admitting: Family Medicine

## 2018-10-22 ENCOUNTER — Other Ambulatory Visit (HOSPITAL_COMMUNITY): Payer: Self-pay

## 2018-10-22 DIAGNOSIS — Z955 Presence of coronary angioplasty implant and graft: Secondary | ICD-10-CM | POA: Insufficient documentation

## 2018-10-22 DIAGNOSIS — Z794 Long term (current) use of insulin: Secondary | ICD-10-CM | POA: Insufficient documentation

## 2018-10-22 DIAGNOSIS — I509 Heart failure, unspecified: Secondary | ICD-10-CM | POA: Insufficient documentation

## 2018-10-22 DIAGNOSIS — Z951 Presence of aortocoronary bypass graft: Secondary | ICD-10-CM | POA: Insufficient documentation

## 2018-10-22 DIAGNOSIS — I4891 Unspecified atrial fibrillation: Secondary | ICD-10-CM | POA: Diagnosis not present

## 2018-10-22 DIAGNOSIS — Z79899 Other long term (current) drug therapy: Secondary | ICD-10-CM | POA: Insufficient documentation

## 2018-10-22 DIAGNOSIS — Z87891 Personal history of nicotine dependence: Secondary | ICD-10-CM | POA: Insufficient documentation

## 2018-10-22 DIAGNOSIS — I110 Hypertensive heart disease with heart failure: Secondary | ICD-10-CM | POA: Insufficient documentation

## 2018-10-22 DIAGNOSIS — I482 Chronic atrial fibrillation, unspecified: Principal | ICD-10-CM | POA: Insufficient documentation

## 2018-10-22 DIAGNOSIS — Z7982 Long term (current) use of aspirin: Secondary | ICD-10-CM | POA: Insufficient documentation

## 2018-10-22 DIAGNOSIS — R0789 Other chest pain: Secondary | ICD-10-CM | POA: Diagnosis not present

## 2018-10-22 DIAGNOSIS — R0602 Shortness of breath: Secondary | ICD-10-CM | POA: Diagnosis not present

## 2018-10-22 DIAGNOSIS — R079 Chest pain, unspecified: Secondary | ICD-10-CM | POA: Diagnosis not present

## 2018-10-22 DIAGNOSIS — Z9861 Coronary angioplasty status: Secondary | ICD-10-CM | POA: Diagnosis not present

## 2018-10-22 DIAGNOSIS — I2510 Atherosclerotic heart disease of native coronary artery without angina pectoris: Secondary | ICD-10-CM | POA: Insufficient documentation

## 2018-10-22 DIAGNOSIS — E785 Hyperlipidemia, unspecified: Secondary | ICD-10-CM | POA: Insufficient documentation

## 2018-10-22 DIAGNOSIS — R011 Cardiac murmur, unspecified: Secondary | ICD-10-CM | POA: Diagnosis not present

## 2018-10-22 DIAGNOSIS — I10 Essential (primary) hypertension: Secondary | ICD-10-CM

## 2018-10-22 DIAGNOSIS — Z96643 Presence of artificial hip joint, bilateral: Secondary | ICD-10-CM | POA: Insufficient documentation

## 2018-10-22 LAB — CBC WITH DIFFERENTIAL/PLATELET
Abs Immature Granulocytes: 0.07 10*3/uL (ref 0.00–0.07)
Basophils Absolute: 0 10*3/uL (ref 0.0–0.1)
Basophils Relative: 1 %
Eosinophils Absolute: 0.6 10*3/uL — ABNORMAL HIGH (ref 0.0–0.5)
Eosinophils Relative: 10 %
HCT: 33.4 % — ABNORMAL LOW (ref 39.0–52.0)
Hemoglobin: 10.7 g/dL — ABNORMAL LOW (ref 13.0–17.0)
Immature Granulocytes: 1 %
Lymphocytes Relative: 17 %
Lymphs Abs: 1.1 10*3/uL (ref 0.7–4.0)
MCH: 33.8 pg (ref 26.0–34.0)
MCHC: 32 g/dL (ref 30.0–36.0)
MCV: 105.4 fL — ABNORMAL HIGH (ref 80.0–100.0)
MONO ABS: 0.7 10*3/uL (ref 0.1–1.0)
Monocytes Relative: 11 %
Neutro Abs: 4 10*3/uL (ref 1.7–7.7)
Neutrophils Relative %: 60 %
PLATELETS: 181 10*3/uL (ref 150–400)
RBC: 3.17 MIL/uL — AB (ref 4.22–5.81)
RDW: 14.7 % (ref 11.5–15.5)
WBC: 6.5 10*3/uL (ref 4.0–10.5)
nRBC: 0 % (ref 0.0–0.2)

## 2018-10-22 LAB — BASIC METABOLIC PANEL
Anion gap: 8 (ref 5–15)
BUN: 18 mg/dL (ref 8–23)
CO2: 23 mmol/L (ref 22–32)
Calcium: 9.1 mg/dL (ref 8.9–10.3)
Chloride: 105 mmol/L (ref 98–111)
Creatinine, Ser: 0.76 mg/dL (ref 0.61–1.24)
GFR calc Af Amer: >60 mL/min (ref >60)
Glucose, Bld: 112 mg/dL — ABNORMAL HIGH (ref 70–99)
Potassium: 4.3 mmol/L (ref 3.5–5.1)
Sodium: 136 mmol/L (ref 135–145)

## 2018-10-22 LAB — TROPONIN I
Troponin I: <0.03 ng/mL (ref <0.03)
Troponin I: <0.03 ng/mL (ref <0.03)
Troponin I: <0.03 ng/mL (ref <0.03)
Troponin I: <0.03 ng/mL (ref <0.03)

## 2018-10-22 MED ORDER — ASPIRIN EC 81 MG PO TBEC
81.0000 mg | DELAYED_RELEASE_TABLET | Freq: Every day | ORAL | Status: DC
  Administered 2018-10-22: 81 mg via ORAL
  Filled 2018-10-22: qty 1

## 2018-10-22 MED ORDER — MORPHINE SULFATE (PF) 2 MG/ML IV SOLN: 2.0000 mg | INTRAVENOUS | Status: DC | PRN

## 2018-10-22 MED ORDER — ONDANSETRON HCL 4 MG/2ML IJ SOLN: 4.0000 mg | Freq: Four times a day (QID) | INTRAMUSCULAR | Status: DC | PRN

## 2018-10-22 MED ORDER — ISOSORBIDE MONONITRATE ER 60 MG PO TB24
30.0000 mg | ORAL_TABLET | Freq: Every day | ORAL | Status: DC
  Administered 2018-10-23: 30 mg via ORAL
  Filled 2018-10-22: qty 1

## 2018-10-22 MED ORDER — FUROSEMIDE 10 MG/ML IJ SOLN
40.0000 mg | Freq: Once | INTRAMUSCULAR | Status: AC
  Administered 2018-10-22: 40 mg via INTRAVENOUS
  Filled 2018-10-22: qty 4

## 2018-10-22 MED ORDER — DILTIAZEM HCL ER COATED BEADS 240 MG PO CP24
ORAL_CAPSULE | ORAL | Status: AC
  Filled 2018-10-22: qty 1

## 2018-10-22 MED ORDER — FUROSEMIDE 20 MG PO TABS: 20.0000 mg | ORAL_TABLET | Freq: Every day | ORAL | Status: DC

## 2018-10-22 MED ORDER — ACETAMINOPHEN 325 MG PO TABS: 650.0000 mg | ORAL_TABLET | ORAL | Status: DC | PRN

## 2018-10-22 MED ORDER — FUROSEMIDE 10 MG/ML IJ SOLN
40.0000 mg | Freq: Every day | INTRAMUSCULAR | Status: DC
  Administered 2018-10-23: 40 mg via INTRAVENOUS
  Filled 2018-10-22: qty 4

## 2018-10-22 MED ORDER — ATORVASTATIN CALCIUM 40 MG PO TABS
80.0000 mg | ORAL_TABLET | Freq: Every day | ORAL | Status: DC
  Administered 2018-10-23: 80 mg via ORAL
  Filled 2018-10-22: qty 2

## 2018-10-22 MED ORDER — RAMIPRIL 1.25 MG PO CAPS
2.5000 mg | ORAL_CAPSULE | Freq: Every day | ORAL | Status: DC
  Administered 2018-10-23: 2.5 mg via ORAL
  Filled 2018-10-22: qty 2

## 2018-10-22 MED ORDER — METOPROLOL SUCCINATE ER 50 MG PO TB24
75.0000 mg | ORAL_TABLET | Freq: Every day | ORAL | Status: DC
  Administered 2018-10-23: 75 mg via ORAL
  Filled 2018-10-22: qty 1

## 2018-10-22 MED ORDER — NITROGLYCERIN 0.4 MG SL SUBL: 0.4000 mg | SUBLINGUAL_TABLET | SUBLINGUAL | Status: DC | PRN

## 2018-10-22 MED ORDER — DILTIAZEM HCL ER 240 MG PO CP24
240.0000 mg | ORAL_CAPSULE | Freq: Every day | ORAL | Status: DC
  Administered 2018-10-23: 240 mg via ORAL
  Filled 2018-10-22 (×3): qty 1

## 2018-10-22 MED ORDER — METHOCARBAMOL 500 MG PO TABS: 500.0000 mg | ORAL_TABLET | Freq: Three times a day (TID) | ORAL | Status: DC | PRN

## 2018-10-22 NOTE — H&P (Signed)
History and Physical    Leonard Cisneros HMC:[id number] DOB: [birth year] DOA: 10/22/2018  PCP: [name], MD  Patient coming from: Home  I have personally briefly reviewed patient's old medical records in [place]  Chief Complaint: Chest pain  HPI: Leonard Cisneros is a 78 y.o. male with medical history significant of coronary artery disease, chronic atrial fib, hypertension, presents to the hospital with complaints of chest pain.  Patient describes left-sided chest pain that was occurring at rest or with activity.  He did not have any associated nausea, lightheadedness, diaphoresis.  He did describe having shortness of breath this morning.  His chest pain did improve with nitroglycerin.  He describes having lower extremity edema which he discussed with his primary care physician and was recently started on Lasix.  He reports taking Lasix, but did not have much urine output.  ED Course: Vitals were noted to be stable in the emergency room.  Cardiac enzymes were unrevealing.  EKG did not show any acute findings.  Chest x-ray showed pulmonary vascular congestion without frank pulmonary edema.  He was seen by cardiology in the emergency room and recommended admission for further cardiac work-up.  Review of Systems: As per HPI otherwise 10 point review of systems negative.    Past Medical History:  Diagnosis Date  . Atrial flutter ([place])    a. s/p remote ablation by Dr. [name].  [name] CAD (coronary artery disease)    a. s/p CABG 2003. b. 01/2016: unstable angina - s/p BMS to SVG-ramus intermediate, patent LIMA-mLAD, patent RIMA-dRCA.   [name] CHF (congestive heart failure) ([place])   . Chronic atrial fibrillation    a. initially post op CABG then chronic. On Coumadin.  . Chronic lower back pain   . Essential hypertension   . High cholesterol   . Ischemic cardiomyopathy    a. Cath 01/2016 -  EF 50-55% with mild distal inferior hypocontractility.  [name] Unspecified hereditary and idiopathic  peripheral neuropathy 04/04/2014    Past Surgical History:  Procedure Laterality Date  . BACK SURGERY    . CARDIAC CATHETERIZATION  ~ 2000; 2003  . CARDIAC CATHETERIZATION N/A 02/01/2016   Procedure: Left Heart Cath and Cors/Grafts Angiography;  Surgeon: [name], MD;  Location: [facility];  Service: Cardiovascular;  Laterality: N/A;  . CARDIAC CATHETERIZATION N/A 02/01/2016   Procedure: Coronary Stent Intervention;  Surgeon: [name], MD;  Location: [facility];  Service: Cardiovascular;  Laterality: N/A;  . COLONOSCOPY  06/04/2002   Normal colon and rectum  . COLONOSCOPY  07/08/2012   Procedure: COLONOSCOPY;  Surgeon: [name], MD;  [facility];  Service: Endoscopy;  Laterality: N/A;  11:30  . COLONOSCOPY N/A 10/02/2017   Procedure: COLONOSCOPY;  Surgeon: [name], MD;  [facility];  Service: Endoscopy;  Laterality: N/A;  10:30am  . CORONARY ANGIOPLASTY WITH STENT PLACEMENT  02/01/2016  . CORONARY ARTERY BYPASS GRAFT  2003   LIMA to LAD,LIMA to distal RCA,SVG to ramus intermediate vessel.  . FOREIGN BODY REMOVAL Right 04/02/2014   Procedure: FOREIGN BODY REMOVAL RIGHT FOOT;  Surgeon: [name], MD;  Location: [facility];  Service: General;  Laterality: Right;  . JOINT REPLACEMENT    . JOINT REPLACEMENT    . LUMBAR DISC SURGERY    . REVISION TOTAL HIP ARTHROPLASTY Bilateral 2005-2012   right-left  . SHOULDER OPEN ROTATOR CUFF REPAIR Right   . TOTAL HIP ARTHROPLASTY Right  [place] ARTHROPLASTY Left 2008  . 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    Social History:  reports that he quit smoking about 52 years ago. His smoking use included cigarettes. He has a 1.00 pack-year smoking history. He has never used smokeless tobacco. He  reports current alcohol use of about 11.0 standard drinks of alcohol per week. He reports that he does not use drugs.  Allergies  Allergen Reactions  . Augmentin [Amoxicillin-Pot Clavulanate] Nausea And Vomiting and Other (See Comments)    Has patient had a PCN reaction causing immediate rash, facial/tongue/throat swelling, SOB or lightheadedness with hypotension: Unknown Has patient had a PCN reaction causing severe rash involving mucus membranes or skin necrosis: No Has patient had a PCN reaction that required hospitalization: No Has patient had a PCN reaction occurring within the last 10 years: No If all of the above answers are "NO", then may proceed with Cephalosporin use.     Family History  Problem Relation Age of Onset  . Heart attack Brother   . Colon cancer Neg Hx     Prior to Admission medications   Medication Sig Start Date End Date Taking? Authorizing Provider  aspirin EC 81 MG tablet Take 1 tablet (81 mg total) by mouth daily. 01/26/16  Yes [name], MD  atorvastatin (LIPITOR) 80 MG tablet Take 1 tablet (80 mg total) by mouth daily. 06/05/18 10/22/18 Yes Kilroy, Doreene Burke, PA-C  diltiazem (DILACOR XR) 240 MG 24 hr capsule Take 1 capsule (240 mg total) by mouth daily. KEEP OV. 07/02/18  Yes [name], MD  furosemide (LASIX) 20 MG tablet Take 1 tablet (20 mg total) by mouth daily. 09/30/18  Yes [name], MD  isosorbide mononitrate (IMDUR) 30 MG 24 hr tablet TAKE 1 TABLET BY MOUTH EVERY DAY 12/04/17  Yes [name], MD  methocarbamol (ROBAXIN) 500 MG tablet Take 1 tablet (500 mg total) by mouth every 8 (eight) hours as needed for muscle spasms. 04/28/18  Yes Johnson, Clanford L, MD  metoprolol succinate (TOPROL-XL) 50 MG 24 hr tablet TAKE 1 & 1/2 TABLETS BY MOUTH EVERY DAY 06/02/18  Yes [name], MD  nitroGLYCERIN (NITROSTAT) 0.4 MG SL tablet DISSOLVE 1 TABLET UNDER THE TONGUE EVERY 5 MINUTES UP TO 15 MINUTES FOR CHEST PAIN 01/29/18  Yes [name],  MD  ramipril (ALTACE) 2.5 MG capsule TAKE ONE CAPSULE BY MOUTH EVERY DAY 04/25/18  Yes [name], MD  warfarin (COUMADIN) 7.5 MG tablet Take as directed by the coumadin clinic Patient taking differently: Take 7.5 mg by mouth daily.  10/07/18  Yes [name], MD  oxyCODONE-acetaminophen (PERCOCET/ROXICET) 5-325 MG tablet One tablet every 4-6 hours as needed for pain Patient not taking: Reported on 10/22/2018 05/13/18   [name], MD    Physical Exam: Vitals:   10/22/18 1645 10/22/18 1700 10/22/18 1730 10/22/18 1849  BP:  138/75 123/72 121/62  Pulse: 79 74 80 84  Resp:  20 16 18   Temp:    98.9 F (37.2 C)  TempSrc:    Oral  SpO2: 92% 94% 96% 94%  Weight:      Height:        Constitutional: NAD, calm, comfortable Eyes: PERRL, lids and conjunctivae normal ENMT: Mucous membranes are moist. Posterior pharynx clear  of any exudate or lesions.Normal dentition.  Neck: normal, supple, no masses, no thyromegaly Respiratory: clear to auscultation bilaterally, no wheezing, no crackles. Normal respiratory effort. No accessory muscle use.  Cardiovascular: Regular rate and rhythm, no murmurs / rubs / gallops. 1+ extremity edema. 2+ pedal pulses. No carotid bruits.  Abdomen: no tenderness, no masses palpated. No hepatosplenomegaly. Bowel sounds positive.  Musculoskeletal: no clubbing / cyanosis. No joint deformity upper and lower extremities. Good ROM, no contractures. Normal muscle tone.  Skin: no rashes, lesions, ulcers. No induration Neurologic: CN 2-12 grossly intact. Sensation intact, DTR normal. Strength 5/5 in all 4.  Psychiatric: Normal judgment and insight. Alert and oriented x 3. Normal mood.    Labs on Admission: I have personally reviewed following labs and imaging studies  CBC: Recent Labs  Lab 10/22/18 0806  WBC 6.5  NEUTROABS 4.0  HGB 10.7*  HCT 33.4*  MCV 105.4*  PLT 462   Basic Metabolic Panel: Recent Labs  Lab 10/22/18 0806  NA 136  K 4.3  CL 105    CO2 23  GLUCOSE 112*  BUN 18  CREATININE 0.76  CALCIUM 9.1   GFR: Estimated Creatinine Clearance: 95 mL/min (by C-G formula based on SCr of 0.76 mg/dL). Liver Function Tests: No results for input(s): AST, ALT, ALKPHOS, BILITOT, PROT, ALBUMIN in the last 168 hours. No results for input(s): LIPASE, AMYLASE in the last 168 hours. No results for input(s): AMMONIA in the last 168 hours. Coagulation Profile: No results for input(s): INR, PROTIME in the last 168 hours. Cardiac Enzymes: Recent Labs  Lab 10/22/18 0806 10/22/18 1240  TROPONINI <0.03 <0.03   BNP (last 3 results) No results for input(s): PROBNP in the last 8760 hours. HbA1C: No results for input(s): HGBA1C in the last 72 hours. CBG: No results for input(s): GLUCAP in the last 168 hours. Lipid Profile: No results for input(s): CHOL, HDL, LDLCALC, TRIG, CHOLHDL, LDLDIRECT in the last 72 hours. Thyroid Function Tests: No results for input(s): TSH, T4TOTAL, FREET4, T3FREE, THYROIDAB in the last 72 hours. Anemia Panel: No results for input(s): VITAMINB12, FOLATE, FERRITIN, TIBC, IRON, RETICCTPCT in the last 72 hours. Urine analysis:    Component Value Date/Time   COLORURINE AMBER (A) 04/26/2018 1751   APPEARANCEUR HAZY (A) 04/26/2018 1751   LABSPEC 1.015 04/26/2018 1751   PHURINE 5.0 04/26/2018 1751   GLUCOSEU NEGATIVE 04/26/2018 1751   HGBUR SMALL (A) 04/26/2018 1751   BILIRUBINUR NEGATIVE 04/26/2018 1751   KETONESUR NEGATIVE 04/26/2018 1751   PROTEINUR NEGATIVE 04/26/2018 1751   UROBILINOGEN 0.2 11/28/2010 0915   NITRITE NEGATIVE 04/26/2018 1751   LEUKOCYTESUR MODERATE (A) 04/26/2018 1751    Radiological Exams on Admission: Dg Chest Portable 1 View  Result Date: 10/22/2018 CLINICAL DATA:  Chest pain and shortness of breath EXAM: PORTABLE CHEST 1 VIEW COMPARISON:  Jan 26, 2016 FINDINGS: There is cardiomegaly with pulmonary venous hypertension. There is no appreciable edema or consolidation. Patient is status  post coronary artery bypass grafting. No adenopathy. There is evidence of old trauma involving the lateral right clavicle. IMPRESSION: Status post coronary artery bypass grafting. Pulmonary vascular congestion without frank edema or consolidation. Electronically Signed   By: Lowella Grip III M.D.   On: 10/22/2018 08:34    EKG: Independently reviewed.  Sinus rhythm without acute changes  Assessment/Plan Active Problems:   Chronic atrial fibrillation   Hx of CABG   Essential hypertension   Hyperlipidemia LDL goal <70   CAD S/P percutaneous coronary angioplasty   Chest  pain    1. Chest pain.  Patient has typical and atypical features.  Plan is to admit to telemetry.  Cycle cardiac markers.  Echocardiogram.  Keep n.p.o. after midnight for possible stress test in a.m.  Appreciate cardiology assistance. 2. Chronic atrial fibrillation.  Continue beta-blockers for rate control.  Holding anticoagulation in case invasive procedures are needed.  He is chronically on Coumadin and current INR is 2.3 3. Pedal edema.  Patient has bilateral pedal edema which is been present for the last few weeks.  He has been taking oral Lasix.  He may need a higher dose.  We will use intravenous Lasix while in the hospital. 4. Hyperlipidemia.  Continue statin 5. Hypertension.  Continue on metoprolol, ramipril and diltiazem. 6. Coronary artery disease.  Continue on beta-blockers, Imdur, aspirin.  DVT prophylaxis: Coumadin currently on hold, use SCDs Code Status: Full code Family Communication: No family present Disposition Plan: Discharge home once work-up is complete Consults called: Cardiology Admission status: Observation, telemetry  Kathie Dike MD [facility]   If 7PM-7AM, please contact night-coverage [email or web address]   10/22/2018, 7:02 PM

## 2018-10-22 NOTE — ED Triage Notes (Signed)
Pt reports cp and sob x 4 days.  Has been taking nitro prn with relief.

## 2018-10-22 NOTE — Progress Notes (Signed)
ANTICOAGULATION CONSULT NOTE - Initial Consult  Pharmacy Consult for Coumadin Indication: atrial fibrillation  Allergies  Allergen Reactions  . Augmentin [Amoxicillin-Pot Clavulanate] Nausea And Vomiting and Other (See Comments)    Has patient had a PCN reaction causing immediate rash, facial/tongue/throat swelling, SOB or lightheadedness with hypotension: Unknown Has patient had a PCN reaction causing severe rash involving mucus membranes or skin necrosis: No Has patient had a PCN reaction that required hospitalization: No Has patient had a PCN reaction occurring within the last 10 years: No If all of the above answers are "NO", then may proceed with Cephalosporin use.     Patient Measurements: Height: 5\' 11"  (180.3 cm) Weight: 230 lb (104.3 kg) IBW/kg (Calculated) : 75.3   Vital Signs: Temp: 97.9 F (36.6 C) (02/19 0756) Temp Source: Oral (02/19 0756) BP: 118/72 (02/19 1500) Pulse Rate: 72 (02/19 1500)  Labs: Recent Labs    10/22/18 0806 10/22/18 1240  HGB 10.7*  --   HCT 33.4*  --   PLT 181  --   CREATININE 0.76  --   TROPONINI <0.03 <0.03    Estimated Creatinine Clearance: 95 mL/min (by C-G formula based on SCr of 0.76 mg/dL).   Medical History: Past Medical History:  Diagnosis Date  . Atrial flutter ([place])    a. s/p remote ablation by Dr. [name].  [name] CAD (coronary artery disease)    a. s/p CABG 2003. b. 01/2016: unstable angina - s/p BMS to SVG-ramus intermediate, patent LIMA-mLAD, patent RIMA-dRCA.   [name] CHF (congestive heart failure) ([place])   . Chronic atrial fibrillation    a. initially post op CABG then chronic. On Coumadin.  . Chronic lower back pain   . Essential hypertension   . High cholesterol   . Ischemic cardiomyopathy    a. Cath 01/2016 -  EF 50-55% with mild distal inferior hypocontractility.  [name] Unspecified hereditary and idiopathic peripheral neuropathy 04/04/2014    Medications:  See med rec  Assessment: Pt on chronic coumadin for afib.  Took daily dose this AM at 0700.  Home dose is 7.5mg  daily, except 3.75mg  on Monday and Friday per anticoag visit.  Goal of Therapy:  INR 2-3 Monitor platelets by anticoagulation protocol: Yes   Plan:  No coumadin now, already taken dose today PT-INR daily Monitor for S/S of bleeding  Isac Sarna, BS Vena Austria, BCPS Clinical Pharmacist Pager [phone] 10/22/2018,4:56 PM

## 2018-10-22 NOTE — Consult Note (Signed)
Cardiology Consultation:   Patient ID: [name] MRN: [id number]; DOB: 1940-12-19  Admit date: 10/22/2018 Date of Consult: 10/22/2018  Primary Care Provider: [name], Cisneros Primary Cardiologist: Leonard Majestic, Cisneros  Primary Electrophysiologist:  None    Patient Profile:   Leonard Cisneros is a 78 y.o. male with a hx of CAD with prior CABG and PCI who is being seen today for the evaluation of chest pain at the request of Leonard Cisneros.  History of Present Illness:   Leonard Cisneros 78 yo male history of CAD with prior CABG in 2003 with subsequent PCI to SVG-RI in 2017. History of aflutter sp ablation, chronic afib, HTN presents with chest pain  Symptoms started about 10 day ago, about 6 total epsidoes over that time. Left sided pain (can't describe character") that can occur at rest or with actvity, No other associated symptoms, though did have some isolated SOB this morning in absence of chest pain. Chest pain improves with SL NG. Has not noticed any overall DOE with his activities.    K 4.3 Cr 0.76 BUN 18 WBC 6.5 Hgb 10.7 (down from 13) Plt 181  Trop neg x2 CXR pul congestion without frank edema EKG SR, no ischemic changes 01/2016 cath: LAD occluded, ramus occluded, prox RCA 70%. RIMA to post atrio graft patent, SVG-ramus 99% stenosis, LIMA-LAD patent. Received stent to SVG-ramus.     Past Medical History:  Diagnosis Date  . Atrial flutter (Leonard Cisneros)    a. s/p remote ablation by Leonard. Lovena Cisneros.  [name] CAD (coronary artery disease)    a. s/p CABG 2003. b. 01/2016: unstable angina - s/p BMS to SVG-ramus intermediate, patent LIMA-mLAD, patent RIMA-dRCA.   [name] CHF (congestive heart failure) ([place])   . Chronic atrial fibrillation    a. initially post op CABG then chronic. On Coumadin.  . Chronic lower back pain   . Essential hypertension   . High cholesterol   . Ischemic cardiomyopathy    a. Cath 01/2016 -  EF 50-55% with mild distal inferior hypocontractility.  [name] Unspecified hereditary and  idiopathic peripheral neuropathy 04/04/2014    Past Surgical History:  Procedure Laterality Date  . BACK SURGERY    . CARDIAC CATHETERIZATION  ~ 2000; 2003  . CARDIAC CATHETERIZATION N/A 02/01/2016   Procedure: Left Heart Cath and Cors/Grafts Angiography;  Surgeon: Leonard Sine, Cisneros;  Location: [facility];  Service: Cardiovascular;  Laterality: N/A;  . CARDIAC CATHETERIZATION N/A 02/01/2016   Procedure: Coronary Stent Intervention;  Surgeon: Leonard Sine, Cisneros;  Location: [facility];  Service: Cardiovascular;  Laterality: N/A;  . COLONOSCOPY  06/04/2002   Normal colon and rectum  . COLONOSCOPY  07/08/2012   Procedure: COLONOSCOPY;  Surgeon: Leonard Dolin, Cisneros;  [facility];  Service: Endoscopy;  Laterality: N/A;  11:30  . COLONOSCOPY N/A 10/02/2017   Procedure: COLONOSCOPY;  Surgeon: Leonard Dolin, Cisneros;  [facility];  Service: Endoscopy;  Laterality: N/A;  10:30am  . CORONARY ANGIOPLASTY WITH STENT PLACEMENT  02/01/2016  . CORONARY ARTERY BYPASS GRAFT  2003   LIMA to LAD,LIMA to distal RCA,SVG to ramus intermediate vessel.  . FOREIGN BODY REMOVAL Right 04/02/2014   Procedure: FOREIGN BODY REMOVAL RIGHT FOOT;  Surgeon: Leonard So, Cisneros;  Location: [facility];  Service: General;  Laterality: Right;  . JOINT REPLACEMENT    . JOINT REPLACEMENT    . LUMBAR DISC SURGERY    . REVISION TOTAL HIP ARTHROPLASTY  Bilateral 2005-2012   right-left  . SHOULDER OPEN ROTATOR CUFF REPAIR Right   . TOTAL HIP ARTHROPLASTY Right 1999  . TOTAL HIP ARTHROPLASTY Left 2008  . 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     Inpatient Medications: Scheduled Meds:  Continuous Infusions:  PRN Meds:   Allergies:    Allergies  Allergen Reactions  . Augmentin [Amoxicillin-Pot Clavulanate]  Nausea And Vomiting and Other (See Comments)    Has patient had a PCN reaction causing immediate rash, facial/tongue/throat swelling, SOB or lightheadedness with hypotension: Unknown Has patient had a PCN reaction causing severe rash involving mucus membranes or skin necrosis: No Has patient had a PCN reaction that required hospitalization: No Has patient had a PCN reaction occurring within the last 10 years: No If all of the above answers are "NO", then may proceed with Cephalosporin use.     Social History:   Social History   Socioeconomic History  . Marital status: Married    Spouse name: Not on file  . Number of children: 2  . Years of education: Not on file  . Highest education level: Not on file  Occupational History  . 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 Financial resource strain: Not hard at all  . Food insecurity:    Worry: Never true    Inability: Never true  . Transportation needs:    Medical: No    Non-medical: No  Tobacco Use  . Smoking status: Former Smoker    Packs/day: 0.50    Years: 2.00    Pack years: 1.00    Types: Cigarettes    Last attempt to quit: 09/03/1966    Years since quitting: 52.1  . Smokeless tobacco: Never Used  Substance and Sexual Activity  . Alcohol use: Yes    Alcohol/week: 11.0 standard drinks    Types: 7 Glasses of wine, 4 Cans of beer per week    Comment: a glass of wine with dinner/ beer on the weekend  . Drug use: No  . Sexual activity: Yes  Lifestyle  . Physical activity:    Days per week: Not on file    Minutes per session: Not on file  . Stress: To some extent  Relationships  . Social connections:    Talks on phone: More than three times a week    Gets together: Three times a week    Attends religious service: Patient refused    Active member of club or organization: Patient refused    Attends meetings of clubs or organizations: Patient refused    Relationship status: Married  .  Intimate partner violence:    Fear of current or ex partner: No    Emotionally abused: No    Physically abused: No    Forced sexual activity: No  Other Topics Concern  . Not on file  Social History Narrative  . Not on file    Family History:    Family History  Problem Relation Age of Onset  . Heart attack Brother   . Colon cancer Neg Hx      ROS:  Please see the history of present illness.  All other ROS reviewed and negative.     Physical Exam/Data:   Vitals:   10/22/18 1300 10/22/18 1315 10/22/18 1330 10/22/18 1500  BP: 116/66  124/61 118/72  Pulse: 80 75 80 72  Resp:    (!) 21  Temp:      TempSrc:      SpO2: 93% 94% 93% 90%  Weight:      Height:        Intake/Output Summary (Last 24 hours) at 10/22/2018 1557 Last data filed at 10/22/2018 1434 Gross per 24 hour  Intake -  Output 2250 ml  Net -2250 ml   Last 3 Weights 10/22/2018 06/16/2018 06/02/2018  Weight (lbs) 230 lb 218 lb 218 lb 8 oz  Weight (kg) 104.327 kg 98.884 kg 99.111 kg     Body mass index is 32.08 kg/m.  General:  Well nourished, well developed, in no acute distress HEENT: normal Lymph: no adenopathy Neck: no JVD Endocrine:  No thryomegaly Vascular: No carotid bruits; FA pulses 2+ bilaterally without bruits  Cardiac:  RRR, 3/6 systolic murmu rursb Lungs:  clear to auscultation bilaterally, no wheezing, rhonchi or rales  Abd: soft, nontender, no hepatomegaly  Ext: no edema Musculoskeletal:  No deformities, BUE and BLE strength normal and equal Skin: warm and dry  Neuro:  CNs 2-12 intact, no focal abnormalities noted Psych:  Normal affect    Laboratory Data:  Chemistry Recent Labs  Lab 10/22/18 0806  NA 136  K 4.3  CL 105  CO2 23  GLUCOSE 112*  BUN 18  CREATININE 0.76  CALCIUM 9.1  GFRNONAA >60  GFRAA >60  ANIONGAP 8    No results for input(s): PROT, ALBUMIN, AST, ALT, ALKPHOS, BILITOT in the last 168 hours. Hematology Recent Labs  Lab 10/22/18 0806  WBC 6.5  RBC 3.17*    HGB 10.7*  HCT 33.4*  MCV 105.4*  MCH 33.8  MCHC 32.0  RDW 14.7  PLT 181   Cardiac Enzymes Recent Labs  Lab 10/22/18 0806 10/22/18 1240  TROPONINI <0.03 <0.03   No results for input(s): TROPIPOC in the last 168 hours.  BNPNo results for input(s): BNP, PROBNP in the last 168 hours.  DDimer No results for input(s): DDIMER in the last 168 hours.  Radiology/Studies:  Dg Chest Portable 1 View  Result Date: 10/22/2018 CLINICAL DATA:  Chest pain and shortness of breath EXAM: PORTABLE CHEST 1 VIEW COMPARISON:  Jan 26, 2016 FINDINGS: There is cardiomegaly with pulmonary venous hypertension. There is no appreciable edema or consolidation. Patient is status post coronary artery bypass grafting. No adenopathy. There is evidence of old trauma involving the lateral right clavicle. IMPRESSION: Status post coronary artery bypass grafting. Pulmonary vascular congestion without frank edema or consolidation. Electronically Signed   By: Lowella Grip III M.D.   On: 10/22/2018 08:34    Assessment and Plan:   1. CAD - presents with chest pain symptoms. Some aspects are suggestive of angina, however the details are not the degree I would consider the story diagnostic in itself.  - cycle enzymes, EKGs overnight. Obtain echo. Make npo tonight, likely stress test tomorrow - If recurrent pain, elevated enzymes, or ekg changes would start heparin at that time.   2. Afib - continue toprol for rate control. Hold coumadin in case invasive proceudres are required.   3. Heart murmur - AS murmur, obtain echo    For questions or updates, please contact [place] Please consult [email or web address] for contact info under     Signed, Carlyle Dolly, Cisneros  10/22/2018 3:57 PM

## 2018-10-22 NOTE — ED Provider Notes (Addendum)
[facility] EMERGENCY DEPARTMENT Provider Note   CSN: [id number] Arrival date & time: 10/22/18  2703    History   Chief Complaint Chief Complaint  Patient presents with  . Chest Pain    HPI Leonard Cisneros is a 78 y.o. male.  HPI: A 78 year old patient with a history of hypertension and hypercholesterolemia presents for evaluation of chest pain. Initial onset of pain was more than 6 hours ago. The patient's chest pain is sharp, is worse with exertion and is relieved by nitroglycerin. The patient's chest pain is middle- or left-sided, is not well-localized, is not described as heaviness/pressure/tightness and does not radiate to the arms/jaw/neck. The patient does not complain of nausea and denies diaphoresis. The patient has no history of stroke, has no history of peripheral artery disease, has not smoked in the past 90 days, denies any history of treated diabetes, has no relevant family history of coronary artery disease (first degree relative at less than age 85) and does not have an elevated BMI (>=30).   HPI  Past Medical History:  Diagnosis Date  . Atrial flutter ([place])    a. s/p remote ablation by Dr. [name].  [name] CAD (coronary artery disease)    a. s/p CABG 2003. b. 01/2016: unstable angina - s/p BMS to SVG-ramus intermediate, patent LIMA-mLAD, patent RIMA-dRCA.   [name] CHF (congestive heart failure) ([place])   . Chronic atrial fibrillation    a. initially post op CABG then chronic. On Coumadin.  . Chronic lower back pain   . Essential hypertension   . High cholesterol   . Ischemic cardiomyopathy    a. Cath 01/2016 -  EF 50-55% with mild distal inferior hypocontractility.  [name] Unspecified hereditary and idiopathic peripheral neuropathy 04/04/2014    Patient Active Problem List   Diagnosis Date Noted  . Physical deconditioning 04/26/2018  . Gait instability 04/26/2018  . Closed fracture of greater trochanter of right femur with routine healing 04/20/18 04/25/2018  . Leg pain  04/24/2018  . Fall 04/21/2018  . PAD (peripheral artery disease) ([place]) 04/21/2018  . Hx of adenomatous colonic polyps 08/20/2017  . Ischemic cardiomyopathy 02/02/2016  . Obesity 02/02/2016  . CAD S/P percutaneous coronary angioplasty 02/01/2016  . Exertional angina ([place]) 01/28/2016  . Hyperlipidemia LDL goal <70 01/28/2016  . Neuropathic pain 06/13/2015  . Mild obesity 04/20/2015  . Essential hypertension 04/20/2015  . Unspecified hereditary and idiopathic peripheral neuropathy 04/04/2014  . Foreign body (FB) in soft tissue 03/31/2014  . Impaired glucose metabolism 03/31/2014  . Obesity (BMI 30-39.9) 03/31/2014  . Hx of CABG 01/04/2014  . Rotator cuff tear, left 10/22/2013  . Rotator cuff syndrome of left shoulder 10/22/2013  . Chronic atrial fibrillation 06/18/2013  . Long term (current) use of anticoagulants 06/18/2013  . Screening for colon cancer 06/18/2012  . High risk medication use 06/18/2012    Past Surgical History:  Procedure Laterality Date  . BACK SURGERY    . CARDIAC CATHETERIZATION  ~ 2000; 2003  . CARDIAC CATHETERIZATION N/A 02/01/2016   Procedure: Left Heart Cath and Cors/Grafts Angiography;  Surgeon: [name], MD;  Location: [facility];  Service: Cardiovascular;  Laterality: N/A;  . CARDIAC CATHETERIZATION N/A 02/01/2016   Procedure: Coronary Stent Intervention;  Surgeon: [name], MD;  Location: [facility];  Service: Cardiovascular;  Laterality: N/A;  . COLONOSCOPY  06/04/2002   Normal colon and rectum  . COLONOSCOPY  07/08/2012   Procedure: COLONOSCOPY;  Surgeon: [name],  MD;  Location[facility];  Service: Endoscopy;  Laterality: N/A;  11:30  . COLONOSCOPY N/A 10/02/2017   Procedure: COLONOSCOPY;  Surgeon: [name], MD;  Location: [facility];  Service: Endoscopy;  Laterality: N/A;  10:30am  . CORONARY ANGIOPLASTY WITH STENT PLACEMENT  02/01/2016  . CORONARY ARTERY BYPASS GRAFT  2003   LIMA to LAD,LIMA to distal  RCA,SVG to ramus intermediate vessel.  . FOREIGN BODY REMOVAL Right 04/02/2014   Procedure: FOREIGN BODY REMOVAL RIGHT FOOT;  Surgeon: [name], MD;  Location: [facility];  Service: General;  Laterality: Right;  . JOINT REPLACEMENT    . JOINT REPLACEMENT    . LUMBAR DISC SURGERY    . REVISION TOTAL HIP ARTHROPLASTY Bilateral 2005-2012   right-left  . SHOULDER OPEN ROTATOR CUFF REPAIR Right   . TOTAL HIP ARTHROPLASTY Right 1999  . TOTAL HIP ARTHROPLASTY Left 2008  . 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        Home Medications    Prior to Admission medications   Medication Sig Start Date End Date Taking? Authorizing Provider  aspirin EC 81 MG tablet Take 1 tablet (81 mg total) by mouth daily. 01/26/16  Yes [name], MD  atorvastatin (LIPITOR) 80 MG tablet Take 1 tablet (80 mg total) by mouth daily. 06/05/18 10/22/18 Yes Kilroy, Doreene Burke, PA-C  diltiazem (DILACOR XR) 240 MG 24 hr capsule Take 1 capsule (240 mg total) by mouth daily. KEEP OV. 07/02/18  Yes [name], MD  furosemide (LASIX) 20 MG tablet Take 1 tablet (20 mg total) by mouth daily. 09/30/18  Yes [name], MD  isosorbide mononitrate (IMDUR) 30 MG 24 hr tablet TAKE 1 TABLET BY MOUTH EVERY DAY 12/04/17  Yes [name], MD  methocarbamol (ROBAXIN) 500 MG tablet Take 1 tablet (500 mg total) by mouth every 8 (eight) hours as needed for muscle spasms. 04/28/18  Yes Johnson, Clanford L, MD  metoprolol succinate (TOPROL-XL) 50 MG 24 hr tablet TAKE 1 & 1/2 TABLETS BY MOUTH EVERY DAY 06/02/18  Yes [name], MD  nitroGLYCERIN (NITROSTAT) 0.4 MG SL tablet DISSOLVE 1 TABLET UNDER THE TONGUE EVERY 5 MINUTES UP TO 15 MINUTES FOR CHEST PAIN 01/29/18  Yes [name], MD  ramipril (ALTACE) 2.5 MG capsule TAKE ONE CAPSULE BY  MOUTH EVERY DAY 04/25/18  Yes [name], MD  warfarin (COUMADIN) 7.5 MG tablet Take as directed by the coumadin clinic Patient taking differently: Take 7.5 mg by mouth daily.  10/07/18  Yes [name], MD  oxyCODONE-acetaminophen (PERCOCET/ROXICET) 5-325 MG tablet One tablet every 4-6 hours as needed for pain Patient not taking: Reported on 10/22/2018 05/13/18   [name], MD    Family History Family History  Problem Relation Age of Onset  . Heart attack Brother   . Colon cancer Neg Hx     Social History Social History   Tobacco Use  . Smoking status: Former Smoker    Packs/day: 0.50    Years: 2.00    Pack years: 1.00    Types: Cigarettes    Last attempt to quit: 09/03/1966    Years since quitting: 52.1  . Smokeless tobacco: Never Used  Substance Use Topics  . Alcohol use: Yes    Alcohol/week: 11.0 standard drinks  Types: 7 Glasses of wine, 4 Cans of beer per week    Comment: a glass of wine with dinner/ beer on the weekend  . Drug use: No     Allergies   Augmentin [amoxicillin-pot clavulanate]   Review of Systems Review of Systems   Physical Exam Updated Vital Signs BP 124/61   Pulse 80   Temp 97.9 F (36.6 C) (Oral)   Resp (!) 28   Ht 5\' 11"  (1.803 m)   Wt 104.3 kg   SpO2 93%   BMI 32.08 kg/m   Physical Exam Vitals signs and nursing note reviewed.  Constitutional:      Appearance: He is well-developed.  HENT:     Head: Normocephalic and atraumatic.  Eyes:     Conjunctiva/sclera: Conjunctivae normal.  Neck:     Musculoskeletal: Neck supple.  Cardiovascular:     Rate and Rhythm: Normal rate and regular rhythm.  Pulmonary:     Effort: Pulmonary effort is normal.     Breath sounds: Normal breath sounds.  Abdominal:     General: Bowel sounds are normal.     Palpations: Abdomen is soft.  Musculoskeletal: Normal range of motion.  Skin:    General: Skin is warm and dry.  Neurological:     Mental Status: He is alert and oriented to  person, place, and time.  Psychiatric:        Behavior: Behavior normal.      ED Treatments / Results  Labs (all labs ordered are listed, but only abnormal results are displayed) Labs Reviewed  CBC WITH DIFFERENTIAL/PLATELET - Abnormal; Notable for the following components:      Result Value   RBC 3.17 (*)    Hemoglobin 10.7 (*)    HCT 33.4 (*)    MCV 105.4 (*)    Eosinophils Absolute 0.6 (*)    All other components within normal limits  BASIC METABOLIC PANEL - Abnormal; Notable for the following components:   Glucose, Bld 112 (*)    All other components within normal limits  TROPONIN I  TROPONIN I    EKG EKG Interpretation  Date/Time:  Wednesday October 22 2018 07:59:23 EST Ventricular Rate:  72 PR Interval:    QRS Duration: 96 QT Interval:  406 QTC Calculation: 445 R Axis:   108 Text Interpretation:  Atrial fibrillation Right axis deviation Low voltage, extremity leads Confirmed by Nat Christen [id number]) on 10/22/2018 10:18:22 AM Also confirmed by Nat Christen [phone])  on 10/22/2018 10:18:37 AM   Radiology Dg Chest Portable 1 View  Result Date: 10/22/2018 CLINICAL DATA:  Chest pain and shortness of breath EXAM: PORTABLE CHEST 1 VIEW COMPARISON:  Jan 26, 2016 FINDINGS: There is cardiomegaly with pulmonary venous hypertension. There is no appreciable edema or consolidation. Patient is status post coronary artery bypass grafting. No adenopathy. There is evidence of old trauma involving the lateral right clavicle. IMPRESSION: Status post coronary artery bypass grafting. Pulmonary vascular congestion without frank edema or consolidation. Electronically Signed   By: Lowella Grip III M.D.   On: 10/22/2018 08:34    Procedures Procedures (including critical care time)  Medications Ordered in ED Medications  furosemide (LASIX) injection 40 mg (40 mg Intravenous Given 10/22/18 1046)     Initial Impression / Assessment and Plan / ED Course  I have reviewed the triage vital  signs and the nursing notes.  Pertinent labs & imaging results that were available during my care of the patient were reviewed by me and considered  in my medical decision making (see chart for details).     HEAR Score: 5  Patient with multiple cardiovascular risk factors presents with persistent chest pain.  Initial work-up including EKG and troponin negative.  Lasix 40 mg IV initiated in the ED.  I consulted cardiology.  Recommend inpatient evaluation via hospitalist.   CRITICAL CARE Performed by: Nat Christen Total critical care time: 30 minutes Critical care time was exclusive of separately billable procedures and treating other patients. Critical care was necessary to treat or prevent imminent or life-threatening deterioration. Critical care was time spent personally by me on the following activities: development of treatment plan with patient and/or surrogate as well as nursing, discussions with consultants, evaluation of patient's response to treatment, examination of patient, obtaining history from patient or surrogate, ordering and performing treatments and interventions, ordering and review of laboratory studies, ordering and review of radiographic studies, pulse oximetry and re-evaluation of patient's condition. Final Clinical Impressions(s) / ED Diagnoses   Final diagnoses:  Chest pain, unspecified type    ED Discharge Orders    None       Nat Christen, MD 10/22/18 1349    Nat Christen, MD 11/10/18 [phone]

## 2018-10-23 ENCOUNTER — Encounter (HOSPITAL_COMMUNITY): Payer: Self-pay

## 2018-10-23 ENCOUNTER — Observation Stay (HOSPITAL_BASED_OUTPATIENT_CLINIC_OR_DEPARTMENT_OTHER): Payer: Medicare HMO

## 2018-10-23 DIAGNOSIS — R079 Chest pain, unspecified: Secondary | ICD-10-CM | POA: Diagnosis not present

## 2018-10-23 DIAGNOSIS — R0602 Shortness of breath: Secondary | ICD-10-CM

## 2018-10-23 DIAGNOSIS — Z955 Presence of coronary angioplasty implant and graft: Secondary | ICD-10-CM | POA: Diagnosis not present

## 2018-10-23 DIAGNOSIS — Z7982 Long term (current) use of aspirin: Secondary | ICD-10-CM | POA: Diagnosis not present

## 2018-10-23 DIAGNOSIS — R011 Cardiac murmur, unspecified: Secondary | ICD-10-CM | POA: Diagnosis not present

## 2018-10-23 DIAGNOSIS — I4891 Unspecified atrial fibrillation: Secondary | ICD-10-CM | POA: Diagnosis not present

## 2018-10-23 DIAGNOSIS — Z951 Presence of aortocoronary bypass graft: Secondary | ICD-10-CM | POA: Diagnosis not present

## 2018-10-23 DIAGNOSIS — E785 Hyperlipidemia, unspecified: Secondary | ICD-10-CM | POA: Diagnosis not present

## 2018-10-23 DIAGNOSIS — Z9861 Coronary angioplasty status: Secondary | ICD-10-CM | POA: Diagnosis not present

## 2018-10-23 DIAGNOSIS — I110 Hypertensive heart disease with heart failure: Secondary | ICD-10-CM | POA: Diagnosis not present

## 2018-10-23 DIAGNOSIS — I482 Chronic atrial fibrillation, unspecified: Secondary | ICD-10-CM | POA: Diagnosis not present

## 2018-10-23 DIAGNOSIS — Z79899 Other long term (current) drug therapy: Secondary | ICD-10-CM | POA: Diagnosis not present

## 2018-10-23 DIAGNOSIS — I10 Essential (primary) hypertension: Secondary | ICD-10-CM | POA: Diagnosis not present

## 2018-10-23 DIAGNOSIS — I2510 Atherosclerotic heart disease of native coronary artery without angina pectoris: Secondary | ICD-10-CM | POA: Diagnosis not present

## 2018-10-23 DIAGNOSIS — I509 Heart failure, unspecified: Secondary | ICD-10-CM | POA: Diagnosis not present

## 2018-10-23 DIAGNOSIS — R0789 Other chest pain: Secondary | ICD-10-CM

## 2018-10-23 DIAGNOSIS — Z96643 Presence of artificial hip joint, bilateral: Secondary | ICD-10-CM | POA: Diagnosis not present

## 2018-10-23 LAB — LIPID PANEL
CHOL/HDL RATIO: 1.7 ratio
CHOLESTEROL: 126 mg/dL (ref 0–200)
HDL: 74 mg/dL (ref >40)
LDL Cholesterol: 41 mg/dL (ref 0–99)
Triglycerides: 53 mg/dL (ref <150)
VLDL: 11 mg/dL (ref 0–40)

## 2018-10-23 LAB — NM MYOCAR MULTI W/SPECT W/WALL MOTION / EF
LV dias vol: 106 mL (ref 62–150)
LV sys vol: 38 mL
Peak HR: 112 {beats}/min
RATE: 0.48
Rest HR: 85 {beats}/min
SDS: 2
SRS: 0
SSS: 2
TID: 1.08

## 2018-10-23 LAB — PROTIME-INR
INR: 1.78
Prothrombin Time: 20.5 seconds — ABNORMAL HIGH (ref 11.4–15.2)

## 2018-10-23 LAB — ECHOCARDIOGRAM COMPLETE
Height: 71 in
Weight: 3680 oz

## 2018-10-23 LAB — TROPONIN I: Troponin I: <0.03 ng/mL (ref <0.03)

## 2018-10-23 LAB — BRAIN NATRIURETIC PEPTIDE: B Natriuretic Peptide: 104 pg/mL — ABNORMAL HIGH (ref 0.0–100.0)

## 2018-10-23 LAB — BASIC METABOLIC PANEL
Anion gap: 10 (ref 5–15)
BUN: 19 mg/dL (ref 8–23)
CO2: 24 mmol/L (ref 22–32)
Calcium: 9 mg/dL (ref 8.9–10.3)
Chloride: 102 mmol/L (ref 98–111)
Creatinine, Ser: 0.92 mg/dL (ref 0.61–1.24)
GFR calc Af Amer: >60 mL/min (ref >60)
GFR calc non Af Amer: >60 mL/min (ref >60)
Glucose, Bld: 95 mg/dL (ref 70–99)
Potassium: 4.1 mmol/L (ref 3.5–5.1)
Sodium: 136 mmol/L (ref 135–145)

## 2018-10-23 MED ORDER — TECHNETIUM TC 99M TETROFOSMIN IV KIT
10.0000 | PACK | Freq: Once | INTRAVENOUS | Status: AC | PRN
  Administered 2018-10-23: 9.5 via INTRAVENOUS

## 2018-10-23 MED ORDER — FUROSEMIDE 40 MG PO TABS: 40.0000 mg | ORAL_TABLET | Freq: Every day | ORAL | 0 refills | Status: DC

## 2018-10-23 MED ORDER — REGADENOSON 0.4 MG/5ML IV SOLN
INTRAVENOUS | Status: AC
  Administered 2018-10-23: 0.4 mg via INTRAVENOUS
  Filled 2018-10-23: qty 5

## 2018-10-23 MED ORDER — TECHNETIUM TC 99M TETROFOSMIN IV KIT
30.0000 | PACK | Freq: Once | INTRAVENOUS | Status: AC | PRN
  Administered 2018-10-23: 31 via INTRAVENOUS

## 2018-10-23 MED ORDER — POTASSIUM CHLORIDE ER 10 MEQ PO TBCR: 10.0000 meq | EXTENDED_RELEASE_TABLET | ORAL | 0 refills | Status: DC

## 2018-10-23 MED ORDER — SODIUM CHLORIDE 0.9% FLUSH
INTRAVENOUS | Status: AC
  Administered 2018-10-23: 10 mL via INTRAVENOUS
  Filled 2018-10-23: qty 10

## 2018-10-23 MED ORDER — ATORVASTATIN CALCIUM 80 MG PO TABS: 80.0000 mg | ORAL_TABLET | Freq: Every day | ORAL | 0 refills | Status: DC

## 2018-10-23 NOTE — Progress Notes (Addendum)
Progress Note  Patient Name: Leonard Cisneros Date of Encounter: 10/23/2018  Primary Cardiologist: [name], MD   Subjective   "Feels great" this morning. He denies any recurrent chest pain overnight. Reports significant improvement in his lower extremity edema.  Inpatient Medications    Scheduled Meds: . aspirin EC  81 mg Oral Daily  . atorvastatin  80 mg Oral Daily  . diltiazem  240 mg Oral Daily  . furosemide  40 mg Intravenous Daily  . isosorbide mononitrate  30 mg Oral Daily  . metoprolol succinate  75 mg Oral Daily  . ramipril  2.5 mg Oral Daily   Continuous Infusions:  PRN Meds: acetaminophen, methocarbamol, morphine injection, nitroGLYCERIN, ondansetron (ZOFRAN) IV   Vital Signs    Vitals:   10/22/18 1730 10/22/18 1849 10/22/18 2309 10/23/18 0533  BP: 123/72 121/62 126/71 123/68  Pulse: 80 84 81 73  Resp: 16 18 18 18   Temp:  98.9 F (37.2 C) 98.7 F (37.1 C) 97.8 F (36.6 C)  TempSrc:  Oral Oral Oral  SpO2: 96% 94% 96% 95%  Weight:      Height:        Intake/Output Summary (Last 24 hours) at 10/23/2018 0855 Last data filed at 10/23/2018 6378 Gross per 24 hour  Intake -  Output 3275 ml  Net -3275 ml   Filed Weights   10/22/18 0751  Weight: 104.3 kg    Telemetry    Rate-controlled atrial fibrillation, HR in 60's to 70's.  - Personally Reviewed  ECG    No new tracings.   Physical Exam   General: Well developed, well nourished Caucasian male appearing in no acute distress. Head: Normocephalic, atraumatic.  Neck: Supple without bruits, JVD not elevated. Lungs:  Resp regular and unlabored, CTA without wheezing or rales. Heart: Irregularly irregular, S1, S2, no S3, S4,  no rub. 2/6 SEM along RUSB.  Abdomen: Soft, non-tender, non-distended with normoactive bowel sounds. No hepatomegaly. No rebound/guarding. No obvious abdominal masses. Extremities: No clubbing or cyanosis, trace lower extremity edema. Distal pedal pulses are 2+  bilaterally. Neuro: Alert and oriented X 3. Moves all extremities spontaneously. Psych: Normal affect.  Labs    Chemistry Recent Labs  Lab 10/22/18 0806 10/23/18 0050  NA 136 136  K 4.3 4.1  CL 105 102  CO2 23 24  GLUCOSE 112* 95  BUN 18 19  CREATININE 0.76 0.92  CALCIUM 9.1 9.0  GFRNONAA >60 >60  GFRAA >60 >60  ANIONGAP 8 10     Hematology Recent Labs  Lab 10/22/18 0806  WBC 6.5  RBC 3.17*  HGB 10.7*  HCT 33.4*  MCV 105.4*  MCH 33.8  MCHC 32.0  RDW 14.7  PLT 181    Cardiac Enzymes Recent Labs  Lab 10/22/18 1240 10/22/18 1913 10/22/18 2204 10/23/18 0050  TROPONINI <0.03 <0.03 <0.03 <0.03   No results for input(s): TROPIPOC in the last 168 hours.   BNPNo results for input(s): BNP, PROBNP in the last 168 hours.   DDimer No results for input(s): DDIMER in the last 168 hours.   Radiology    Dg Chest Portable 1 View  Result Date: 10/22/2018 CLINICAL DATA:  Chest pain and shortness of breath EXAM: PORTABLE CHEST 1 VIEW COMPARISON:  Jan 26, 2016 FINDINGS: There is cardiomegaly with pulmonary venous hypertension. There is no appreciable edema or consolidation. Patient is status post coronary artery bypass grafting. No adenopathy. There is evidence of old trauma involving the lateral right clavicle. IMPRESSION: Status  post coronary artery bypass grafting. Pulmonary vascular congestion without frank edema or consolidation. Electronically Signed   By: Lowella Grip III M.D.   On: 10/22/2018 08:34    Cardiac Studies   Cardiac Catheterization: 01/2016  Ost LAD lesion, 100% stenosed.  Ost Ramus lesion, 100% stenosed.  Prox RCA lesion, 70% stenosed.  Mid RCA lesion, 50% stenosed.  RIMA .  LIMA .  Prox LAD to Mid LAD lesion, 75% stenosed.  SVG .  Origin lesion, 99% stenosed. Post intervention, there is a 0% residual stenosis.  The left ventricular systolic function is normal.   Low normal LV function with a subtle region of distal inferior  hypocontractility.  Significant 2 vessel CAD with total occlusion of the LAD and ramus intermediate at its origin; normal left circumflex coronary artery; and RCA with 70% proximal stenosis with diffuse 50% mid stenoses with a " flush and fill" phenomena seen in the distal RCA due to competitive filling the the RIMA graft.  Patent LIMA graft supplying the mid LAD.  Patent RIMA graft supplying the distal RCA.  SVG supplying the ramus intermediate vessel with a 99% near ostial subtotal stenosis with thrombus.  Successful PCI to the SVG with ultimate insertion of a 2.512 mm NirXcell bare metal stent postdilated to 2.64 mm with a 99% stenosis being reduced to 0% resumption of brisk TIMI-3 flow.  RECOMMENDATION: The patient will be on triple therapy with aspirin/Plavix/Coumadin for a month.  Coumadin will be restarted tonight and after 1 month he will will be transitioned to dual therapy.    Patient Profile     78 y.o. male w/ PMH of CAD (s/p CABG in 2003 with cath in 2017 showing patent LIMA-LAD, patent RIMA-dRCA, and 99% stenosis along SVG-RI treated with BMS), chronic atrial fibrillation, HTN, and HLD who presented to [facility] ED on 10/22/2018 for evaluation of chest pain.   Assessment & Plan   1.  Chest Pain with Mixed Typical and Atypical Features - presented with episodes of chest pain occurring for the past two weeks while at rest or with activity. Symptoms were improving with the use of SL NTG but also improved with belching at times. Not as intense as when he required stent placement in 1610. - cyclic enzymes have been negative and EKG shows no acute ischemic changes.  - will plan for a Lexiscan Myoview for ischemic evaluation. Unable to walk on the treadmill due to recent hip fracture.   2. CAD - s/p CABG in 2003 with cath in 2017 showing patent LIMA-LAD, patent RIMA-dRCA, and 99% stenosis along SVG-RI treated with BMS.  - cyclic enzymes have been negative and EKG shows no  acute ischemic changes. Plan for NST as outlined above.  - continue ASA (had been on both ASA and Coumadin PTA), statin, Imdur, and BB therapy.   3. Chronic Atrial Fibrillation  - rates have been well-controlled in the 60's to 70's by review of telemetry. Remains on Cardizem CD and Toprol-XL for rate-control.  - on Coumadin PTA but held at this time in case catheterization is warranted. INR 1.78 this AM. If stress test is high-risk and Coumadin is unable to be restarted today, would start Heparin for bridging.   4. Nonischemic Cardiomyopathy - EF low-normal at 50-55% by cath in 2017. Repeat echocardiogram pending to assess LV function. Received IV Lasix 40mg  yesterday with a recorded output of -3.2L thus far. Scheduled for additional IV dose today (would wait to administer until back from stress test).  -  continue BB and ACE-I. If EF further reduced, would need to readdress the use of Cardizem.   5. HTN - BP has been well-controlled at 108/53 - 138/91 since admission. He has been continued on Cardizem CD 240mg  daily, Imdur 30mg  daily, Toprol-XL 75mg  daily, and Ramipril 2.5mg  daily.   6. HLD - LDL was elevated to 95 in 06/2018 and he was restarted on Atorvastatin 80mg  daily at that time. Will add-on FLP to prior labs.   7. Murmur - 2/6 SEM noted along RUSB. Echocardiogram pending for further evaluation.   For questions or updates, please contact [place] Please consult [email or web address] for contact info under Cardiology/STEMI.   Arna Medici , PA-C 8:55 AM 10/23/2018 Pager: [phone]  Attending note Patient seen and discussed with PA Ahmed Prima, I agree with her documentation above. Enzymes negative overnight. Echo pending. Stress test is normal without evidence of ishcemia. Symptoms have resolved. Continue current medical therapy, pending LVEF may be some changes we need to make. Had some LE edema on presentation improved with IV lasix, would change his home regimen to  40mg  daily and f/u echo. Perhaps chest pain was related to some volume overload. Likely discharge today pending echo results.    245 pm addendum LVEF is normal. Cannot eval diastolic function in setting of afib but signifcant biatrial enlargement would suggest abnormal diastolic function. Mild AS.  Would change his home lasix to 40mg  daily.   Zandra Abts MD

## 2018-10-23 NOTE — Progress Notes (Signed)
*  PRELIMINARY RESULTS* Echocardiogram 2D Echocardiogram has been performed.  Leavy Cella 10/23/2018, 10:09 AM

## 2018-10-23 NOTE — Progress Notes (Signed)
**Note De-identified Marialuiza Car Obfuscation** EKG complete RN notified  and placed in patient chart.  

## 2018-10-23 NOTE — Care Management Obs Status (Signed)
[place] NOTIFICATION   Patient Details  Name: BRADDOCK SERVELLON MRN: [id number] Date of Birth: May 01, 1941   Medicare Observation Status Notification Given:  Yes    Sherald Barge, RN 10/23/2018, 10:46 AM

## 2018-10-23 NOTE — Progress Notes (Signed)
Patient discharged home today per MD orders. Patient vital signs WDL. IV removed and site WDL. Discharge Instructions including follow up appointments, medications, and education reviewed with patient. Patient verbalizes understanding. Patient is transported out via wheelchair.  

## 2018-10-23 NOTE — Discharge Summary (Signed)
Physician Discharge Summary  Leonard Cisneros LHT:[id number] DOB: 1941/04/09 DOA: 10/22/2018  PCP: [name], MD Cardiologist: Dr. Claiborne Billings  Admit date: 10/22/2018 Discharge date: 10/23/2018  Admitted From: Home  Disposition:  Home   Recommendations for Outpatient Follow-up:  1. Follow up with PCP in 1 weeks 2. Follow up with cardiology in 1 month  Discharge Condition: STABLE   CODE STATUS: FULL    Brief Hospitalization Summary: Please see all hospital notes, images, labs for full details of the hospitalization. HPI: Leonard Cisneros is a 78 y.o. male with medical history significant of coronary artery disease, chronic atrial fib, hypertension, presents to the hospital with complaints of chest pain.  Patient describes left-sided chest pain that was occurring at rest or with activity.  He did not have any associated nausea, lightheadedness, diaphoresis.  He did describe having shortness of breath this morning.  His chest pain did improve with nitroglycerin.  He describes having lower extremity edema which he discussed with his primary care physician and was recently started on Lasix.  He reports taking Lasix, but did not have much urine output.  ED Course: Vitals were noted to be stable in the emergency room.  Cardiac enzymes were unrevealing.  EKG did not show any acute findings.  Chest x-ray showed pulmonary vascular congestion without frank pulmonary edema.  He was seen by cardiology in the emergency room and recommended admission for further cardiac work-up.  This patient was admitted for observation and for serial troponin testing to rule out acute myocardial ischemia.  Serial troponins have tested negative x4.  The patient was seen by the inpatient cardiology service.  The patient was sent for stress testing which did not reveal any reversible ischemia.  The patient also had a 2D echocardiogram that did not show any significant wall motion abnormalities.  Please see echocardiogram  report below.  The patient's Lasix was increased to 40 mg daily at discharge.  While in the hospital he received IV furosemide for diuresis and tolerated that very well.  He was noted to be slightly volume overloaded on admission with increased edema in the legs which has completely resolved.  The patient was again seen by cardiology today and noted to be stable for discharge home with outpatient follow-up.  The only change to his medication was the increase of Lasix to 40 mg daily.  He was also given supplemental potassium 10 mEq every other day.  Discharge Diagnoses:  Active Problems:   Chronic atrial fibrillation   Hx of CABG   Essential hypertension   Hyperlipidemia LDL goal <70   CAD S/P percutaneous coronary angioplasty   Chest pain  Discharge Instructions: Discharge Instructions    (HEART FAILURE PATIENTS) Call MD:  Anytime you have any of the following symptoms: 1) 3 pound weight gain in 24 hours or 5 pounds in 1 week 2) shortness of breath, with or without a dry hacking cough 3) swelling in the hands, feet or stomach 4) if you have to sleep on extra pillows at night in order to breathe.   Complete by:  As directed    Call MD for:  difficulty breathing, headache or visual disturbances   Complete by:  As directed    Call MD for:  extreme fatigue   Complete by:  As directed    Call MD for:  persistant dizziness or light-headedness   Complete by:  As directed    Call MD for:  persistant nausea and vomiting   Complete by:  As directed    Call MD for:  severe uncontrolled pain   Complete by:  As directed    Diet - low sodium heart healthy   Complete by:  As directed    Increase activity slowly   Complete by:  As directed      Allergies as of 10/23/2018      Reactions   Augmentin [amoxicillin-pot Clavulanate] Nausea And Vomiting, Other (See Comments)   Has patient had a PCN reaction causing immediate rash, facial/tongue/throat swelling, SOB or lightheadedness with hypotension:  Unknown Has patient had a PCN reaction causing severe rash involving mucus membranes or skin necrosis: No Has patient had a PCN reaction that required hospitalization: No Has patient had a PCN reaction occurring within the last 10 years: No If all of the above answers are "NO", then may proceed with Cephalosporin use.      Medication List    STOP taking these medications   oxyCODONE-acetaminophen 5-325 MG tablet Commonly known as:  PERCOCET/ROXICET     TAKE these medications   aspirin EC 81 MG tablet Take 1 tablet (81 mg total) by mouth daily.   atorvastatin 80 MG tablet Commonly known as:  LIPITOR Take 1 tablet (80 mg total) by mouth daily for 30 days.   diltiazem 240 MG 24 hr capsule Commonly known as:  DILACOR XR Take 1 capsule (240 mg total) by mouth daily. KEEP OV.   furosemide 40 MG tablet Commonly known as:  LASIX Take 1 tablet (40 mg total) by mouth daily for 30 days. Start taking on:  October 24, 2018 What changed:    medication strength  how much to take   isosorbide mononitrate 30 MG 24 hr tablet Commonly known as:  IMDUR TAKE 1 TABLET BY MOUTH EVERY DAY   methocarbamol 500 MG tablet Commonly known as:  ROBAXIN Take 1 tablet (500 mg total) by mouth every 8 (eight) hours as needed for muscle spasms.   metoprolol succinate 50 MG 24 hr tablet Commonly known as:  TOPROL-XL TAKE 1 & 1/2 TABLETS BY MOUTH EVERY DAY   nitroGLYCERIN 0.4 MG SL tablet Commonly known as:  NITROSTAT DISSOLVE 1 TABLET UNDER THE TONGUE EVERY 5 MINUTES UP TO 15 MINUTES FOR CHEST PAIN   potassium chloride 10 MEQ tablet Commonly known as:  K-DUR Take 1 tablet (10 mEq total) by mouth every other day for 30 days. Start taking on:  October 24, 2018   ramipril 2.5 MG capsule Commonly known as:  ALTACE TAKE ONE CAPSULE BY MOUTH EVERY DAY   warfarin 7.5 MG tablet Commonly known as:  COUMADIN Take as directed. If you are unsure how to take this medication, talk to your nurse or  doctor. Original instructions:  Take as directed by the coumadin clinic What changed:    how much to take  how to take this  when to take this  additional instructions      Follow-up Information    Luking, Grace Bushy, MD. Schedule an appointment as soon as possible for a visit in 1 week(s).   Specialty:  Family Medicine Why:  Hospital Follow Up  Contact information: [address] [place] [address] [phone]        [name], MD. Schedule an appointment as soon as possible for a visit in 1 month(s).   Specialty:  Cardiology Why:  Hospital Follow Up  Contact information: [address] [place] [address] [phone]  Allergies  Allergen Reactions  . Augmentin [Amoxicillin-Pot Clavulanate] Nausea And Vomiting and Other (See Comments)    Has patient had a PCN reaction causing immediate rash, facial/tongue/throat swelling, SOB or lightheadedness with hypotension: Unknown Has patient had a PCN reaction causing severe rash involving mucus membranes or skin necrosis: No Has patient had a PCN reaction that required hospitalization: No Has patient had a PCN reaction occurring within the last 10 years: No If all of the above answers are "NO", then may proceed with Cephalosporin use.    Allergies as of 10/23/2018      Reactions   Augmentin [amoxicillin-pot Clavulanate] Nausea And Vomiting, Other (See Comments)   Has patient had a PCN reaction causing immediate rash, facial/tongue/throat swelling, SOB or lightheadedness with hypotension: Unknown Has patient had a PCN reaction causing severe rash involving mucus membranes or skin necrosis: No Has patient had a PCN reaction that required hospitalization: No Has patient had a PCN reaction occurring within the last 10 years: No If all of the above answers are "NO", then may proceed with Cephalosporin use.      Medication List    STOP taking these medications    oxyCODONE-acetaminophen 5-325 MG tablet Commonly known as:  PERCOCET/ROXICET     TAKE these medications   aspirin EC 81 MG tablet Take 1 tablet (81 mg total) by mouth daily.   atorvastatin 80 MG tablet Commonly known as:  LIPITOR Take 1 tablet (80 mg total) by mouth daily for 30 days.   diltiazem 240 MG 24 hr capsule Commonly known as:  DILACOR XR Take 1 capsule (240 mg total) by mouth daily. KEEP OV.   furosemide 40 MG tablet Commonly known as:  LASIX Take 1 tablet (40 mg total) by mouth daily for 30 days. Start taking on:  October 24, 2018 What changed:    medication strength  how much to take   isosorbide mononitrate 30 MG 24 hr tablet Commonly known as:  IMDUR TAKE 1 TABLET BY MOUTH EVERY DAY   methocarbamol 500 MG tablet Commonly known as:  ROBAXIN Take 1 tablet (500 mg total) by mouth every 8 (eight) hours as needed for muscle spasms.   metoprolol succinate 50 MG 24 hr tablet Commonly known as:  TOPROL-XL TAKE 1 & 1/2 TABLETS BY MOUTH EVERY DAY   nitroGLYCERIN 0.4 MG SL tablet Commonly known as:  NITROSTAT DISSOLVE 1 TABLET UNDER THE TONGUE EVERY 5 MINUTES UP TO 15 MINUTES FOR CHEST PAIN   potassium chloride 10 MEQ tablet Commonly known as:  K-DUR Take 1 tablet (10 mEq total) by mouth every other day for 30 days. Start taking on:  October 24, 2018   ramipril 2.5 MG capsule Commonly known as:  ALTACE TAKE ONE CAPSULE BY MOUTH EVERY DAY   warfarin 7.5 MG tablet Commonly known as:  COUMADIN Take as directed. If you are unsure how to take this medication, talk to your nurse or doctor. Original instructions:  Take as directed by the coumadin clinic What changed:    how much to take  how to take this  when to take this  additional instructions      Procedures/Studies: Echocardiogram 10/23/18 IMPRESSIONS  1. The left ventricle has normal systolic function, with an ejection fraction of 55-60%. The cavity size was normal. There is moderately  increased left ventricular wall thickness. Left ventricular diastology could not be evaluated secondary to atrial  fibrillation.  2. The right ventricle has normal systolic function. The cavity was normal. There  is no increase in right ventricular wall thickness.  3. Left atrial size was severely dilated.  4. Right atrial size was moderately dilated.  5. The mitral valve is normal in structure. Moderate thickening of the mitral valve leaflet. No evidence of mitral valve stenosis.  6. The tricuspid valve is normal in structure.  7. The aortic valve is tricuspid Moderate thickening of the aortic valve mild stenosis of the aortic valve.  8. The aortic root is normal in size and structure.  9. Pulmonary hypertension is indeterminant, inadequate TR jet. 10. The inferior vena cava was dilated in size with >50% respiratory variability.  Nm Myocar Multi W/spect W/wall Motion / Ef  Result Date: 10/23/2018  There was no ST segment deviation noted during stress.  The study is normal. There are no perfusion defects  This is a low risk study.  The left ventricular ejection fraction is normal (55-65%).    US Venous Img Lower Bilateral  Result Date: 10/06/2018 CLINICAL DATA:  Post fall 6 months ago with persistent pedal edema. Elevated D-dimer. Patient is currently on anticoagulation. History of harvesting of the right greater saphenous vein for previous CABG. Evaluate for DVT. EXAM: BILATERAL LOWER EXTREMITY VENOUS DOPPLER ULTRASOUND TECHNIQUE: Gray-scale sonography with graded compression, as well as color Doppler and duplex ultrasound were performed to evaluate the lower extremity deep venous systems from the level of the common femoral vein and including the common femoral, femoral, profunda femoral, popliteal and calf veins including the posterior tibial, peroneal and gastrocnemius veins when visible. The superficial great saphenous vein was also interrogated. Spectral Doppler was utilized to evaluate  flow at rest and with distal augmentation maneuvers in the common femoral, femoral and popliteal veins. COMPARISON:  None. FINDINGS: RIGHT LOWER EXTREMITY Common Femoral Vein: No evidence of thrombus. Normal compressibility, respiratory phasicity and response to augmentation. Saphenofemoral Junction: No evidence of thrombus. Normal compressibility and flow on color Doppler imaging. Profunda Femoral Vein: No evidence of thrombus. Normal compressibility and flow on color Doppler imaging. Femoral Vein: No evidence of thrombus. Normal compressibility, respiratory phasicity and response to augmentation. Popliteal Vein: No evidence of thrombus. Normal compressibility, respiratory phasicity and response to augmentation. Calf Veins: No evidence of thrombus. Normal compressibility and flow on color Doppler imaging. Superficial Great Saphenous Vein: Surgically absent. Venous Reflux:  None. Other Findings: Benign-appearing non pathologically enlarged right inguinal lymph node within this node measuring 0.8 cm in greatest short axis diameter and maintaining a benign fatty hilum (image 10). A moderate amount of subcutaneous edema is seen at the level of the right lower leg (image 27). LEFT LOWER EXTREMITY Common Femoral Vein: No evidence of thrombus. Normal compressibility, respiratory phasicity and response to augmentation. Saphenofemoral Junction: No evidence of thrombus. Normal compressibility and flow on color Doppler imaging. Profunda Femoral Vein: No evidence of thrombus. Normal compressibility and flow on color Doppler imaging. Femoral Vein: No evidence of thrombus. Normal compressibility, respiratory phasicity and response to augmentation. Popliteal Vein: No evidence of thrombus. Normal compressibility, respiratory phasicity and response to augmentation. Calf Veins: No evidence of thrombus. Normal compressibility and flow on color Doppler imaging. Superficial Great Saphenous Vein: No evidence of thrombus. Normal  compressibility. Venous Reflux:  None. Other Findings: There is a large amount of subcutaneous edema at the level the left lower leg and calf. IMPRESSION: No evidence of DVT within either lower extremity. Electronically Signed   By: Sandi Mariscal M.D.   On: 10/06/2018 13:17   US Venous Img Lower Bilateral  Result Date:  09/30/2018 CLINICAL DATA:  Bilateral lower extremity edema. History of fall 5 months ago. Patient is currently on anticoagulation. Evaluate for DVT. EXAM: BILATERAL LOWER EXTREMITY VENOUS DOPPLER ULTRASOUND TECHNIQUE: Gray-scale sonography with graded compression, as well as color Doppler and duplex ultrasound were performed to evaluate the lower extremity deep venous systems from the level of the common femoral vein and including the common femoral, femoral, profunda femoral, popliteal and calf veins including the posterior tibial, peroneal and gastrocnemius veins when visible. The superficial great saphenous vein was also interrogated. Spectral Doppler was utilized to evaluate flow at rest and with distal augmentation maneuvers in the common femoral, femoral and popliteal veins. COMPARISON:  None. FINDINGS: RIGHT LOWER EXTREMITY Common Femoral Vein: No evidence of thrombus. Normal compressibility, respiratory phasicity and response to augmentation. Saphenofemoral Junction: No evidence of thrombus. Normal compressibility and flow on color Doppler imaging. Profunda Femoral Vein: No evidence of thrombus. Normal compressibility and flow on color Doppler imaging. Femoral Vein: No evidence of thrombus. Normal compressibility, respiratory phasicity and response to augmentation. Popliteal Vein: No evidence of thrombus. Normal compressibility, respiratory phasicity and response to augmentation. Calf Veins: No evidence of thrombus. Normal compressibility and flow on color Doppler imaging. Superficial Great Saphenous Vein: No evidence of thrombus. Normal compressibility. Venous Reflux:  None. Other Findings:  Note is made of a benign appearing non pathologically enlarged right inguinal lymph node which is not enlarged by size criteria measuring 0.9 cm in greatest short axis diameter and maintains a benign fatty hilum (representative image 27), likely reactive in etiology. Moderate amount subcutaneous edema is noted at the level the right lower leg and calf. LEFT LOWER EXTREMITY Common Femoral Vein: No evidence of thrombus. Normal compressibility, respiratory phasicity and response to augmentation. Saphenofemoral Junction: No evidence of thrombus. Normal compressibility and flow on color Doppler imaging. Profunda Femoral Vein: No evidence of thrombus. Normal compressibility and flow on color Doppler imaging. Femoral Vein: No evidence of thrombus. Normal compressibility, respiratory phasicity and response to augmentation. Popliteal Vein: No evidence of thrombus. Normal compressibility, respiratory phasicity and response to augmentation. Calf Veins: No evidence of thrombus. Normal compressibility and flow on color Doppler imaging. Superficial Great Saphenous Vein: No evidence of thrombus. Normal compressibility. Venous Reflux:  None. Other Findings: Moderate amount of subcutaneous edema at the level of the left lower leg and calf. IMPRESSION: No evidence of DVT within either lower extremity. Electronically Signed   By: Sandi Mariscal M.D.   On: 09/30/2018 12:53   Dg Chest Portable 1 View  Result Date: 10/22/2018 CLINICAL DATA:  Chest pain and shortness of breath EXAM: PORTABLE CHEST 1 VIEW COMPARISON:  Jan 26, 2016 FINDINGS: There is cardiomegaly with pulmonary venous hypertension. There is no appreciable edema or consolidation. Patient is status post coronary artery bypass grafting. No adenopathy. There is evidence of old trauma involving the lateral right clavicle. IMPRESSION: Status post coronary artery bypass grafting. Pulmonary vascular congestion without frank edema or consolidation. Electronically Signed   By:  Lowella Grip III M.D.   On: 10/22/2018 08:34    Subjective: The patient reports that he is feeling much better today.  He has no chest pain or shortness of breath.  He has been ambulating without difficulty.  He is happy that the edema in the lower extremities has resolved.   Discharge Exam: Vitals:   10/23/18 0533 10/23/18 1450  BP: 123/68 96/64  Pulse: 73 (!) 102  Resp: 18 16  Temp: 97.8 F (36.6 C) 98.2 F (36.8 C)  SpO2:  95% 92%   Vitals:   10/22/18 1849 10/22/18 2309 10/23/18 0533 10/23/18 1450  BP: 121/62 126/71 123/68 96/64  Pulse: 84 81 73 (!) 102  Resp: 18 18 18 16   Temp: 98.9 F (37.2 C) 98.7 F (37.1 C) 97.8 F (36.6 C) 98.2 F (36.8 C)  TempSrc: Oral Oral Oral Oral  SpO2: 94% 96% 95% 92%  Weight:      Height:       General: Pt is alert, awake, not in acute distress Cardiovascular: normal S1/S2 +, no rubs, no gallops Respiratory: CTA bilaterally, no wheezing, no rhonchi Abdominal: Soft, NT, ND, bowel sounds + Extremities: trace pretibial edema bilateral, no cyanosis   The results of significant diagnostics from this hospitalization (including imaging, microbiology, ancillary and laboratory) are listed below for reference.     Microbiology: No results found for this or any previous visit (from the past 240 hour(s)).   Labs: BNP (last 3 results) Recent Labs    09/30/18 1132 10/23/18 0030  BNP 123.0* 826.4*   Basic Metabolic Panel: Recent Labs  Lab 10/22/18 0806 10/23/18 0050  NA 136 136  K 4.3 4.1  CL 105 102  CO2 23 24  GLUCOSE 112* 95  BUN 18 19  CREATININE 0.76 0.92  CALCIUM 9.1 9.0   Liver Function Tests: No results for input(s): AST, ALT, ALKPHOS, BILITOT, PROT, ALBUMIN in the last 168 hours. No results for input(s): LIPASE, AMYLASE in the last 168 hours. No results for input(s): AMMONIA in the last 168 hours. CBC: Recent Labs  Lab 10/22/18 0806  WBC 6.5  NEUTROABS 4.0  HGB 10.7*  HCT 33.4*  MCV 105.4*  PLT 181    Cardiac Enzymes: Recent Labs  Lab 10/22/18 0806 10/22/18 1240 10/22/18 1913 10/22/18 2204 10/23/18 0050  TROPONINI <0.03 <0.03 <0.03 <0.03 <0.03   BNP: Invalid input(s): POCBNP CBG: No results for input(s): GLUCAP in the last 168 hours. D-Dimer No results for input(s): DDIMER in the last 72 hours. Hgb A1c No results for input(s): HGBA1C in the last 72 hours. Lipid Profile Recent Labs    10/23/18 0050  CHOL 126  HDL 74  LDLCALC 41  TRIG 53  CHOLHDL 1.7   Thyroid function studies No results for input(s): TSH, T4TOTAL, T3FREE, THYROIDAB in the last 72 hours.  Invalid input(s): FREET3 Anemia work up No results for input(s): VITAMINB12, FOLATE, FERRITIN, TIBC, IRON, RETICCTPCT in the last 72 hours. Urinalysis    Component Value Date/Time   COLORURINE AMBER (A) 04/26/2018 1751   APPEARANCEUR HAZY (A) 04/26/2018 1751   LABSPEC 1.015 04/26/2018 1751   PHURINE 5.0 04/26/2018 1751   GLUCOSEU NEGATIVE 04/26/2018 1751   HGBUR SMALL (A) 04/26/2018 1751   BILIRUBINUR NEGATIVE 04/26/2018 1751   KETONESUR NEGATIVE 04/26/2018 1751   PROTEINUR NEGATIVE 04/26/2018 1751   UROBILINOGEN 0.2 11/28/2010 0915   NITRITE NEGATIVE 04/26/2018 1751   LEUKOCYTESUR MODERATE (A) 04/26/2018 1751   Sepsis Labs Invalid input(s): PROCALCITONIN,  WBC,  LACTICIDVEN Microbiology No results found for this or any previous visit (from the past 240 hour(s)).  Time coordinating discharge:   SIGNED:  Irwin Brakeman, MD  [facility] 10/23/2018, 2:57 PM How to contact the [facility] Attending or Consulting provider [place] or covering provider during after hours [place], for this patient?  1. Check the care team in [facility] and look for a) attending/consulting [facility] provider listed and b) the [facility] team listed 2. Log into [email or web address] and use [place]'s universal password to access.  If you do not have the password, please contact the hospital operator. 3. Locate the [facility] provider you are looking for  under [facility] and page to a number that you can be directly reached. 4. If you still have difficulty reaching the provider, please page the [facility] (Director on Call) for the Hospitalists listed on amion for assistance.

## 2018-10-23 NOTE — Discharge Instructions (Signed)
Your lasix dose has increased to 40 mg daily.  Please follow up with your primary care physician and cardiologist.     IMPORTANT INFORMATION: PAY CLOSE ATTENTION   PHYSICIAN DISCHARGE INSTRUCTIONS  Follow with Primary care provider  [name], MD  and other consultants as instructed your Hospitalist Physician  [place] IF SYMPTOMS COME BACK, WORSEN OR NEW PROBLEM DEVELOPS.   Please note: You were cared for by a hospitalist during your hospital stay. Every effort will be made to forward records to your primary care provider.  You can request that your primary care provider send for your hospital records if they have not received them.  Once you are discharged, your primary care physician will handle any further medical issues. Please note that NO REFILLS for any discharge medications will be authorized once you are discharged, as it is imperative that you return to your primary care physician (or establish a relationship with a primary care physician if you do not have one) for your post hospital discharge needs so that they can reassess your need for medications and monitor your lab values.  Please get a complete blood count and chemistry panel checked by your Primary MD at your next visit, and again as instructed by your Primary MD.  Get Medicines reviewed and adjusted: Please take all your medications with you for your next visit with your Primary MD  Laboratory/radiological data: Please request your Primary MD to go over all hospital tests and procedure/radiological results at the follow up, please ask your primary care provider to get all Hospital records sent to his/her office.  In some cases, they will be blood work, cultures and biopsy results pending at the time of your discharge. Please request that your primary care provider follow up on these results.  If you are diabetic, please bring your blood sugar readings with you to your follow up  appointment with primary care.    Please call and make your follow up appointments as soon as possible.    Also Note the following: If you experience worsening of your admission symptoms, develop shortness of breath, life threatening emergency, suicidal or homicidal thoughts you must seek medical attention immediately by calling 911 or calling your MD immediately  if symptoms less severe.  You must read complete instructions/literature along with all the possible adverse reactions/side effects for all the Medicines you take and that have been prescribed to you. Take any new Medicines after you have completely understood and accpet all the possible adverse reactions/side effects.   Do not drive when taking Pain medications or sleeping medications (Benzodiazepines)  Do not take more than prescribed Pain, Sleep and Anxiety Medications. It is not advisable to combine anxiety,sleep and pain medications without talking with your primary care practitioner  Special Instructions: If you have smoked or chewed Tobacco  in the last 2 yrs please stop smoking, stop any regular Alcohol  and or any Recreational drug use.  Wear Seat belts while driving.

## 2018-10-24 ENCOUNTER — Telehealth: Payer: Self-pay | Admitting: *Deleted

## 2018-10-24 NOTE — Telephone Encounter (Signed)
Leonard Kirschner, MD        Contact pt, see how he's doing, document, sched appt mid next week

## 2018-10-24 NOTE — Telephone Encounter (Signed)
Patient states he is doing well- the swelling is gone from his legs and he is having no chest pain or any other symptoms Patient has and appointment for a hospital follow up 10/30/18 at 1:10 with Dr Richardson Landry

## 2018-10-26 NOTE — Telephone Encounter (Signed)
Ok by me

## 2018-10-27 NOTE — Telephone Encounter (Signed)
That is fine 

## 2018-10-30 ENCOUNTER — Ambulatory Visit (INDEPENDENT_AMBULATORY_CARE_PROVIDER_SITE_OTHER): Payer: Medicare HMO | Admitting: Family Medicine

## 2018-10-30 ENCOUNTER — Encounter: Payer: Self-pay | Admitting: Family Medicine

## 2018-10-30 VITALS — BP 128/86 | Ht 71.0 in | Wt 236.0 lb

## 2018-10-30 DIAGNOSIS — I482 Chronic atrial fibrillation, unspecified: Secondary | ICD-10-CM

## 2018-10-30 DIAGNOSIS — I878 Other specified disorders of veins: Secondary | ICD-10-CM

## 2018-10-30 DIAGNOSIS — I739 Peripheral vascular disease, unspecified: Secondary | ICD-10-CM | POA: Diagnosis not present

## 2018-10-30 DIAGNOSIS — I10 Essential (primary) hypertension: Secondary | ICD-10-CM | POA: Diagnosis not present

## 2018-10-30 MED ORDER — FUROSEMIDE 40 MG PO TABS: ORAL_TABLET | ORAL | 1 refills | Status: DC

## 2018-10-30 MED ORDER — POTASSIUM CHLORIDE ER 10 MEQ PO TBCR: EXTENDED_RELEASE_TABLET | ORAL | 1 refills | Status: DC

## 2018-10-30 NOTE — Progress Notes (Signed)
   Subjective:    Patient ID: Leonard Cisneros, male    DOB: 1941/03/06, 78 y.o.   MRN: [id number]  HPI  Patient arrives for a follow up for a recent hospitalization for chest pain and swelling in legs.  Patient states he is feeling better but is still having some swelling in legs but not as bad as when in hospital.  Pt s p hospitalization for  p and sob nd some pedal edema  Complete hospital record reviewed today in presence of patient.  Also all consultant notes.  Also all imaging studies.  Also all features.  And all blood work.  Admission H&P and hospital discharge summary was reviewed today in presence of patient  Patient states feeling overall better now.  No longer any chest discomfort.  No substantial shortness of breath.  On a new dose of Lasix.  On a somewhat small dose of potassium.    Review of Systems No headache, no major weight loss or weight gain, no chest pain no back pain abdominal pain no change in bowel habits complete ROS otherwise negative     Objective:   Physical Exam   Alert and oriented, vitals reviewed and stable, NAD ENT-TM's and ext canals WNL bilat via otoscopic exam Soft palate, tonsils and post pharynx WNL via oropharyngeal exam Neck-symmetric, no masses; thyroid nonpalpable and nontender Pulmonary-no tachypnea or accessory muscle use; Clear without wheezes via auscultation Card--no abnrml murmurs, rhythm not reg and controlled rate WNL Carotid pulses symmetric, without bruits 1+ venous stasis bilateral.  Diminished but decent pulses both feet.  Old vein harvest scar right anterior leg mild dependent rubor     Assessment & Plan:  Impression #1 leg edema/  Venous stasis certainly a component..  Multiple etiologies.  Certainly loss of atrial kick with chronic atrial fibrillation contributes.  Cardizem can be contributing to edema.  CHF was listed within the hospital notes.  Discussed with patient.  Does appear to have an element of controlled  CHF.  BNP was only mildly elevated.  Chest x-ray did show a little pulmonary congestion.  Echocardiogram revealed a retained ejection fraction but with substantial dilated atrium along with element of aortic stenosis.  All of this contributing to edema.  Will maintain same dose of Lasix.  Need to evaluate him at 7 next week.  For now stay on same dose of Lasix and potassium.  Not inclined to increase Lasix rationale discussed.  Bilateral venous stockings encouraged.  Patient already has a pair follow-up in several months.  Warning signs discussed

## 2018-11-06 DIAGNOSIS — I10 Essential (primary) hypertension: Secondary | ICD-10-CM | POA: Diagnosis not present

## 2018-11-07 LAB — BASIC METABOLIC PANEL WITH GFR
BUN/Creatinine Ratio: 20 (ref 10–24)
BUN: 20 mg/dL (ref 8–27)
CO2: 20 mmol/L (ref 20–29)
Calcium: 9.3 mg/dL (ref 8.6–10.2)
Chloride: 105 mmol/L (ref 96–106)
Creatinine, Ser: 1 mg/dL (ref 0.76–1.27)
GFR calc Af Amer: 84 mL/min/1.73
GFR calc non Af Amer: 72 mL/min/1.73
Glucose: 96 mg/dL (ref 65–99)
Potassium: 4.2 mmol/L (ref 3.5–5.2)
Sodium: 140 mmol/L (ref 134–144)

## 2018-11-10 ENCOUNTER — Other Ambulatory Visit: Payer: Self-pay | Admitting: Cardiovascular Disease

## 2018-11-18 ENCOUNTER — Encounter (HOSPITAL_COMMUNITY)
Admission: RE | Admit: 2018-11-18 | Discharge: 2018-11-18 | Disposition: A | Discharge status: Home / Self Care | Payer: Medicare HMO | Source: Ambulatory Visit | Attending: Ophthalmology | Admitting: Ophthalmology

## 2018-11-18 ENCOUNTER — Other Ambulatory Visit: Payer: Self-pay

## 2018-11-18 ENCOUNTER — Encounter (HOSPITAL_COMMUNITY): Payer: Self-pay

## 2018-11-19 ENCOUNTER — Ambulatory Visit: Payer: Medicare HMO | Admitting: Student

## 2018-11-19 NOTE — Progress Notes (Deleted)
Cardiology Office Note    Date:  11/19/2018   ID:  Leonard Cisneros, DOB [birth year], MRN [id number]  PCP:  [name], MD  Cardiologist: [name], MD    No chief complaint on file.   History of Present Illness:    Leonard Cisneros is a 78 y.o. male with past medical history of CAD (s/p CABG in 2003 with cath in 2017 showing patent LIMA-LAD, patent RIMA-dRCA, and 99% stenosis along SVG-RI treated with BMS), chronic atrial fibrillation, HTN, and HLD who presents to the office today for hospital follow-up.     Past Medical History:  Diagnosis Date  . Atrial flutter ([place])    a. s/p remote ablation by Dr. [name].  [name] CAD (coronary artery disease)    a. s/p CABG 2003. b. 01/2016: unstable angina - s/p BMS to SVG-ramus intermediate, patent LIMA-mLAD, patent RIMA-dRCA.   [name] CHF (congestive heart failure) ([place])   . Chronic atrial fibrillation    a. initially post op CABG then chronic. On Coumadin.  . Chronic lower back pain   . Essential hypertension   . High cholesterol   . Ischemic cardiomyopathy    a. Cath 01/2016 -  EF 50-55% with mild distal inferior hypocontractility.  [name] Unspecified hereditary and idiopathic peripheral neuropathy 04/04/2014    Past Surgical History:  Procedure Laterality Date  . BACK SURGERY    . CARDIAC CATHETERIZATION  ~ 2000; 2003  . CARDIAC CATHETERIZATION N/A 02/01/2016   Procedure: Left Heart Cath and Cors/Grafts Angiography;  Surgeon: [name], MD;  Location: [facility];  Service: Cardiovascular;  Laterality: N/A;  . CARDIAC CATHETERIZATION N/A 02/01/2016   Procedure: Coronary Stent Intervention;  Surgeon: [name], MD;  Location: [facility];  Service: Cardiovascular;  Laterality: N/A;  . COLONOSCOPY  06/04/2002   Normal colon and rectum  . COLONOSCOPY  07/08/2012   Procedure: COLONOSCOPY;  Surgeon: [name], MD;  [facility];  Service: Endoscopy;  Laterality: N/A;  11:30  . COLONOSCOPY N/A  10/02/2017   Procedure: COLONOSCOPY;  Surgeon: [name], MD;  [facility]: [facility];  Service: Endoscopy;  Laterality: N/A;  10:30am  . CORONARY ANGIOPLASTY WITH STENT PLACEMENT  02/01/2016  . CORONARY ARTERY BYPASS GRAFT  2003   LIMA to LAD,LIMA to distal RCA,SVG to ramus intermediate vessel.  . FOREIGN BODY REMOVAL Right 04/02/2014   Procedure: FOREIGN BODY REMOVAL RIGHT FOOT;  Surgeon: [name], MD;  Location: [facility];  Service: General;  Laterality: Right;  . JOINT REPLACEMENT    . JOINT REPLACEMENT    . LUMBAR DISC SURGERY    . REVISION TOTAL HIP ARTHROPLASTY Bilateral 2005-2012   right-left  . SHOULDER OPEN ROTATOR CUFF REPAIR Right   . TOTAL HIP ARTHROPLASTY Right 1999  . TOTAL HIP ARTHROPLASTY Left 2008  . 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    Current Medications: Outpatient Medications Prior to Visit  Medication Sig Dispense Refill  . aspirin EC 81 MG tablet Take 1 tablet (81 mg total) by mouth daily. 90 tablet 3  . atorvastatin (LIPITOR) 80 MG tablet Take 1 tablet (80 mg total) by mouth daily for 30 days. 30 tablet 0  . diltiazem (DILACOR XR) 240 MG 24 hr capsule Take 1 capsule (240 mg total) by mouth  daily. KEEP OV. 90 capsule 1  . furosemide (LASIX) 40 MG tablet Take one daily (Patient taking differently: Take 40 mg by mouth daily. Take one daily) 90 tablet 1  . isosorbide mononitrate (IMDUR) 30 MG 24 hr tablet TAKE 1 TABLET BY MOUTH EVERY DAY (Patient taking differently: Take 30 mg by mouth daily. ) 90 tablet 3  . methocarbamol (ROBAXIN) 500 MG tablet Take 1 tablet (500 mg total) by mouth every 8 (eight) hours as needed for muscle spasms. (Patient not taking: Reported on 11/14/2018) 12 tablet 0  . metoprolol succinate (TOPROL-XL) 50 MG 24 hr tablet TAKE 1 & 1/2 TABLETS BY  MOUTH EVERY DAY (Patient taking differently: Take 75 mg by mouth daily. ) 135 tablet 3  . nitroGLYCERIN (NITROSTAT) 0.4 MG SL tablet DISSOLVE 1 TABLET UNDER THE TONGUE EVERY 5 MINUTES UP TO 15 MINUTES FOR CHEST PAIN (Patient taking differently: Place 0.4 mg under the tongue every 5 (five) minutes as needed for chest pain. DISSOLVE 1 TABLET UNDER THE TONGUE EVERY 5 MINUTES UP TO 15 MINUTES FOR CHEST PAIN) 25 tablet 0  . potassium chloride (K-DUR) 10 MEQ tablet Take one every other day (Patient taking differently: Take 10 mEq by mouth every other day. Take one every other day) 45 tablet 1  . ramipril (ALTACE) 2.5 MG capsule TAKE ONE CAPSULE BY MOUTH EVERY DAY (Patient taking differently: Take 2.5 mg by mouth daily. ) 90 capsule 2  . warfarin (COUMADIN) 7.5 MG tablet Take as directed by the coumadin clinic (Patient taking differently: Take 7.5 mg by mouth daily. Take 7.5 mg by mouth on Tuesday, Wednesday, Thursday, Saturday and Sunday, take 3.75 mg by mouth on Monday and Friday) 90 tablet 0   No facility-administered medications prior to visit.      Allergies:   Augmentin [amoxicillin-pot clavulanate]   Social History   Socioeconomic History  . Marital status: Married    Spouse name: Not on file  . Number of children: 2  . Years of education: Not on file  . Highest education level: Not on file  Occupational History  . 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 Financial resource strain: Not hard at all  . Food insecurity:    Worry: Never true    Inability: Never true  . Transportation needs:    Medical: No    Non-medical: No  Tobacco Use  . Smoking status: Former Smoker    Packs/day: 0.50    Years: 2.00    Pack years: 1.00    Types: Cigarettes    Last attempt to quit: 09/03/1966    Years since quitting: 52.2  . Smokeless tobacco: Never Used  Substance and Sexual Activity  . Alcohol use: Yes    Alcohol/week: 11.0 standard drinks    Types: 7  Glasses of wine, 4 Cans of beer per week    Comment: a glass of wine with dinner/ beer on the weekend  . Drug use: No  . Sexual activity: Yes  Lifestyle  . Physical activity:    Days per week: Not on file    Minutes per session: Not on file  . Stress: To some extent  Relationships  . Social connections:    Talks on phone: More than three times a week    Gets together: Three times a week    Attends religious service: Patient refused    Active member of club or organization: Patient refused    Attends  meetings of clubs or organizations: Patient refused    Relationship status: Married  Other Topics Concern  . Not on file  Social History Narrative  . Not on file     Family History:  The patient's ***family history includes Heart attack in his brother.   Review of Systems:   Please see the history of present illness.     General:  No chills, fever, night sweats or weight changes.  Cardiovascular:  No chest pain, dyspnea on exertion, edema, orthopnea, palpitations, paroxysmal nocturnal dyspnea. Dermatological: No rash, lesions/masses Respiratory: No cough, dyspnea Urologic: No hematuria, dysuria Abdominal:   No nausea, vomiting, diarrhea, bright red blood per rectum, melena, or hematemesis Neurologic:  No visual changes, wkns, changes in mental status. All other systems reviewed and are otherwise negative except as noted above.   Physical Exam:    VS:  There were no vitals taken for this visit.   General: Well developed, well nourished,male appearing in no acute distress. Head: Normocephalic, atraumatic, sclera non-icteric, no xanthomas, nares are without discharge.  Neck: No carotid bruits. JVD not elevated.  Lungs: Respirations regular and unlabored, without wheezes or rales.  Heart: ***Regular rate and rhythm. No S3 or S4.  No murmur, no rubs, or gallops appreciated. Abdomen: Soft, non-tender, non-distended with normoactive bowel sounds. No hepatomegaly. No  rebound/guarding. No obvious abdominal masses. Msk:  Strength and tone appear normal for age. No joint deformities or effusions. Extremities: No clubbing or cyanosis. No edema.  Distal pedal pulses are 2+ bilaterally. Neuro: Alert and oriented X 3. Moves all extremities spontaneously. No focal deficits noted. Psych:  Responds to questions appropriately with a normal affect. Skin: No rashes or lesions noted  Wt Readings from Last 3 Encounters:  10/30/18 236 lb (107 kg)  10/22/18 230 lb (104.3 kg)  06/16/18 218 lb (98.9 kg)        Studies/Labs Reviewed:   EKG:  EKG is*** ordered today.  The ekg ordered today demonstrates ***  Recent Labs: 10/22/2018: Hemoglobin 10.7; Platelets 181 10/23/2018: B Natriuretic Peptide 104.0 11/06/2018: BUN 20; Creatinine, Ser 1.00; Potassium 4.2; Sodium 140   Lipid Panel    Component Value Date/Time   CHOL 126 10/23/2018 0050   CHOL 187 06/03/2018 0749   TRIG 53 10/23/2018 0050   HDL 74 10/23/2018 0050   HDL 77 06/03/2018 0749   CHOLHDL 1.7 10/23/2018 0050   VLDL 11 10/23/2018 0050   LDLCALC 41 10/23/2018 0050   LDLCALC 95 06/03/2018 0749    Additional studies/ records that were reviewed today include:   NST: 10/23/2018  There was no ST segment deviation noted during stress.  The study is normal. There are no perfusion defects  This is a low risk study.  The left ventricular ejection fraction is normal (55-65%).  Echocardiogram: 10/23/2018 IMPRESSIONS    1. The left ventricle has normal systolic function, with an ejection fraction of 55-60%. The cavity size was normal. There is moderately increased left ventricular wall thickness. Left ventricular diastology could not be evaluated secondary to atrial  fibrillation.  2. The right ventricle has normal systolic function. The cavity was normal. There is no increase in right ventricular wall thickness.  3. Left atrial size was severely dilated.  4. Right atrial size was moderately  dilated.  5. The mitral valve is normal in structure. Moderate thickening of the mitral valve leaflet. No evidence of mitral valve stenosis.  6. The tricuspid valve is normal in structure.  7. The aortic valve is  tricuspid Moderate thickening of the aortic valve mild stenosis of the aortic valve.  8. The aortic root is normal in size and structure.  9. Pulmonary hypertension is indeterminant, inadequate TR jet. 10. The inferior vena cava was dilated in size with >50% respiratory variability.  Assessment:    No diagnosis found.   Plan:   In order of problems listed above:  1. ***    Medication Adjustments/Labs and Tests Ordered: Current medicines are reviewed at length with the patient today.  Concerns regarding medicines are outlined above.  Medication changes, Labs and Tests ordered today are listed in the Patient Instructions below. There are no Patient Instructions on file for this visit.   Signed, Erma Heritage, PA-C  11/19/2018 7:43 AM    [place] [address] [place], [place] [address] Phone: [phone] Fax: [phone]

## 2018-11-21 DIAGNOSIS — M25562 Pain in left knee: Secondary | ICD-10-CM | POA: Diagnosis not present

## 2018-11-21 DIAGNOSIS — M25551 Pain in right hip: Secondary | ICD-10-CM | POA: Diagnosis not present

## 2018-11-21 DIAGNOSIS — M25561 Pain in right knee: Secondary | ICD-10-CM | POA: Diagnosis not present

## 2018-11-21 DIAGNOSIS — S72111D Displaced fracture of greater trochanter of right femur, subsequent encounter for closed fracture with routine healing: Secondary | ICD-10-CM | POA: Diagnosis not present

## 2018-11-24 ENCOUNTER — Encounter (HOSPITAL_COMMUNITY): Admission: RE | Payer: Self-pay | Source: Home / Self Care

## 2018-11-24 ENCOUNTER — Ambulatory Visit (HOSPITAL_COMMUNITY): Admission: RE | Admit: 2018-11-24 | Payer: Medicare HMO | Source: Home / Self Care | Admitting: Ophthalmology

## 2018-11-24 SURGERY — PHACOEMULSIFICATION, CATARACT, WITH IOL INSERTION
Anesthesia: Monitor Anesthesia Care | Laterality: Left

## 2018-12-03 ENCOUNTER — Other Ambulatory Visit (HOSPITAL_COMMUNITY): Payer: Self-pay | Admitting: Orthopedic Surgery

## 2018-12-03 DIAGNOSIS — M25551 Pain in right hip: Secondary | ICD-10-CM

## 2018-12-10 ENCOUNTER — Other Ambulatory Visit (HOSPITAL_COMMUNITY): Payer: Medicare HMO

## 2018-12-15 ENCOUNTER — Ambulatory Visit: Admit: 2018-12-15 | Payer: Medicare HMO | Admitting: Ophthalmology

## 2018-12-15 SURGERY — PHACOEMULSIFICATION, CATARACT, WITH IOL INSERTION
Anesthesia: Monitor Anesthesia Care | Laterality: Right

## 2018-12-17 ENCOUNTER — Telehealth (INDEPENDENT_AMBULATORY_CARE_PROVIDER_SITE_OTHER): Payer: Medicare HMO | Admitting: Student

## 2018-12-17 ENCOUNTER — Encounter: Payer: Self-pay | Admitting: Student

## 2018-12-17 VITALS — BP 132/70 | HR 79

## 2018-12-17 DIAGNOSIS — I2510 Atherosclerotic heart disease of native coronary artery without angina pectoris: Secondary | ICD-10-CM

## 2018-12-17 DIAGNOSIS — Z7189 Other specified counseling: Secondary | ICD-10-CM | POA: Diagnosis not present

## 2018-12-17 DIAGNOSIS — I482 Chronic atrial fibrillation, unspecified: Secondary | ICD-10-CM | POA: Diagnosis not present

## 2018-12-17 DIAGNOSIS — E785 Hyperlipidemia, unspecified: Secondary | ICD-10-CM

## 2018-12-17 DIAGNOSIS — Z7982 Long term (current) use of aspirin: Secondary | ICD-10-CM | POA: Diagnosis not present

## 2018-12-17 DIAGNOSIS — I10 Essential (primary) hypertension: Secondary | ICD-10-CM

## 2018-12-17 DIAGNOSIS — Z79899 Other long term (current) drug therapy: Secondary | ICD-10-CM

## 2018-12-17 DIAGNOSIS — Z7901 Long term (current) use of anticoagulants: Secondary | ICD-10-CM | POA: Diagnosis not present

## 2018-12-17 DIAGNOSIS — Z87891 Personal history of nicotine dependence: Secondary | ICD-10-CM

## 2018-12-17 NOTE — Progress Notes (Signed)
Virtual Visit via Telephone Note   This visit type was conducted due to national recommendations for restrictions regarding the COVID-19 Pandemic (e.g. social distancing) in an effort to limit this patient's exposure and mitigate transmission in our community.  Due to his co-morbid illnesses, this patient is at least at moderate risk for complications without adequate follow up.  This format is felt to be most appropriate for this patient at this time.  The patient did not have access to video technology/had technical difficulties with video requiring transitioning to audio format only (telephone).  All issues noted in this document were discussed and addressed.  No physical exam could be performed with this format.  Please refer to the patient's chart for his  consent to telehealth for [facility].   Evaluation Performed:  Follow-up visit  Date:  12/18/2018   ID:  Leonard Cisneros, DOB [birth year], MRN [id number]  Patient Location: Home Provider Location: Home  PCP:  [name], MD  Cardiologist:  [name], MD  Electrophysiologist:  None   Chief Complaint: Hospital Follow-up  History of Present Illness:    Leonard Cisneros is a 78 y.o. male with past medical history of CAD (s/p CABG in 2003 with cath in 2017 showing patent LIMA-LAD, patent RIMA-dRCA, and 99% stenosis along SVG-RI treated with BMS), chronic atrial fibrillation, HTN, and HLD.   He was most recently admitted to [facility] in 10/2018 for evaluation of chest pain occurring for the past several weeks. Cyclic enzymes remained negative and EKG showed no acute ischemic changes. Echocardiogram showed a preserved EF of 55-60% with no regional WMA. Did have severely dilated LA and moderately dilated RA. NST was performed and showed no significant perfusion defects, overall being a low-risk study. It was thought symptoms might have been secondary to volume overload and Lasix was titrated to 40mg  daily upon discharge.    In talking with the patient today, he reports overall doing well from a cardiac perspective since discharge. He denies any recurrent chest pain or dyspnea. No recent orthopnea, PND, or edema. Weight has been stable on his home scales. He does experience occasional palpitations which typically occur in the morning hours and only last for 30-45 seconds. No symptoms throughout the day or night. No associated dizziness or presyncope. He does mention he is still walking with crutches due to his recent hip fracture. Scheduled for a bone scan next week.   The patient does not have symptoms concerning for COVID-19 infection (fever, chills, cough, or new shortness of breath).    Past Medical History:  Diagnosis Date  . Atrial flutter ([place])    a. s/p remote ablation by Dr. [name].  [name] CAD (coronary artery disease)    a. s/p CABG 2003. b. 01/2016: unstable angina - s/p BMS to SVG-ramus intermediate, patent LIMA-mLAD, patent RIMA-dRCA.   [name] CHF (congestive heart failure) ([place])   . Chronic atrial fibrillation    a. initially post op CABG then chronic. On Coumadin.  . Chronic lower back pain   . Essential hypertension   . High cholesterol   . Ischemic cardiomyopathy    a. Cath 01/2016 -  EF 50-55% with mild distal inferior hypocontractility.  [name] Unspecified hereditary and idiopathic peripheral neuropathy 04/04/2014   Past Surgical History:  Procedure Laterality Date  . BACK SURGERY    . CARDIAC CATHETERIZATION  ~ 2000; 2003  . CARDIAC CATHETERIZATION N/A 02/01/2016   Procedure: Left Heart Cath and Cors/Grafts Angiography;  Surgeon: Joyice Faster  Claiborne Billings, MD;  Location: [place] [facility];  Service: Cardiovascular;  Laterality: N/A;  . CARDIAC CATHETERIZATION N/A 02/01/2016   Procedure: Coronary Stent Intervention;  Surgeon: [name], MD;  Location: [place] [facility];  Service: Cardiovascular;  Laterality: N/A;  . COLONOSCOPY  06/04/2002   Normal colon and rectum  . COLONOSCOPY  07/08/2012   Procedure:  COLONOSCOPY;  Surgeon: [name], MD;  [facility];  Service: Endoscopy;  Laterality: N/A;  11:30  . COLONOSCOPY N/A 10/02/2017   Procedure: COLONOSCOPY;  Surgeon: [name], MD;  [facility];  Service: Endoscopy;  Laterality: N/A;  10:30am  . CORONARY ANGIOPLASTY WITH STENT PLACEMENT  02/01/2016  . CORONARY ARTERY BYPASS GRAFT  2003   LIMA to LAD,LIMA to distal RCA,SVG to ramus intermediate vessel.  . FOREIGN BODY REMOVAL Right 04/02/2014   Procedure: FOREIGN BODY REMOVAL RIGHT FOOT;  Surgeon: [name], MD;  Location: [facility];  Service: General;  Laterality: Right;  . JOINT REPLACEMENT    . JOINT REPLACEMENT    . LUMBAR DISC SURGERY    . REVISION TOTAL HIP ARTHROPLASTY Bilateral 2005-2012   right-left  . SHOULDER OPEN ROTATOR CUFF REPAIR Right   . TOTAL HIP ARTHROPLASTY Right 1999  . TOTAL HIP ARTHROPLASTY Left 2008  . 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     Current Meds  Medication Sig  . aspirin EC 81 MG tablet Take 1 tablet (81 mg total) by mouth daily.  [name] atorvastatin (LIPITOR) 80 MG tablet Take 1 tablet (80 mg total) by mouth daily for 30 days.  [name] diltiazem (DILACOR XR) 240 MG 24 hr capsule Take 1 capsule (240 mg total) by mouth daily. KEEP OV.  . furosemide (LASIX) 40 MG tablet Take one daily (Patient taking differently: Take 40 mg by mouth daily. Take one daily)  . isosorbide mononitrate (IMDUR) 30 MG 24 hr tablet TAKE 1 TABLET BY MOUTH EVERY DAY (Patient taking differently: Take 30 mg by mouth daily. )  . methocarbamol (ROBAXIN) 500 MG tablet Take 1 tablet (500 mg total) by mouth every 8 (eight) hours as needed for muscle spasms.  . metoprolol succinate (TOPROL-XL) 50 MG 24 hr tablet TAKE 1 & 1/2 TABLETS BY MOUTH EVERY DAY (Patient taking differently:  Take 75 mg by mouth daily. )  . nitroGLYCERIN (NITROSTAT) 0.4 MG SL tablet DISSOLVE 1 TABLET UNDER THE TONGUE EVERY 5 MINUTES UP TO 15 MINUTES FOR CHEST PAIN (Patient taking differently: Place 0.4 mg under the tongue every 5 (five) minutes as needed for chest pain. DISSOLVE 1 TABLET UNDER THE TONGUE EVERY 5 MINUTES UP TO 15 MINUTES FOR CHEST PAIN)  . potassium chloride (K-DUR) 10 MEQ tablet Take one every other day (Patient taking differently: Take 10 mEq by mouth every other day. Take one every other day)  . ramipril (ALTACE) 2.5 MG capsule TAKE ONE CAPSULE BY MOUTH EVERY DAY (Patient taking differently: Take 2.5 mg by mouth daily. )  . warfarin (COUMADIN) 7.5 MG tablet Take as directed by the coumadin clinic (Patient taking differently: Take 7.5 mg by mouth daily. Take 7.5 mg by mouth on Tuesday, Wednesday, Thursday, Saturday and Sunday, take 3.75 mg by mouth on Monday and Friday)     Allergies:   Augmentin [amoxicillin-pot clavulanate]   Social History   Tobacco  Use  . Smoking status: Former Smoker    Packs/day: 0.50    Years: 2.00    Pack years: 1.00    Types: Cigarettes    Last attempt to quit: 09/03/1966    Years since quitting: 52.3  . Smokeless tobacco: Never Used  Substance Use Topics  . Alcohol use: Yes    Alcohol/week: 11.0 standard drinks    Types: 7 Glasses of wine, 4 Cans of beer per week    Comment: a glass of wine with dinner/ beer on the weekend  . Drug use: No     Family Hx: The patient's family history includes Heart attack in his brother. There is no history of Colon cancer.  ROS:   Please see the history of present illness.     All other systems reviewed and are negative.   Prior CV studies:   The following studies were reviewed today:  Echocardiogram: 10/2018 IMPRESSIONS    1. The left ventricle has normal systolic function, with an ejection fraction of 55-60%. The cavity size was normal. There is moderately increased left ventricular wall thickness.  Left ventricular diastology could not be evaluated secondary to atrial  fibrillation.  2. The right ventricle has normal systolic function. The cavity was normal. There is no increase in right ventricular wall thickness.  3. Left atrial size was severely dilated.  4. Right atrial size was moderately dilated.  5. The mitral valve is normal in structure. Moderate thickening of the mitral valve leaflet. No evidence of mitral valve stenosis.  6. The tricuspid valve is normal in structure.  7. The aortic valve is tricuspid Moderate thickening of the aortic valve mild stenosis of the aortic valve.  8. The aortic root is normal in size and structure.  9. Pulmonary hypertension is indeterminant, inadequate TR jet. 10. The inferior vena cava was dilated in size with >50% respiratory variability.  NST: 10/2018  There was no ST segment deviation noted during stress.  The study is normal. There are no perfusion defects  This is a low risk study.  The left ventricular ejection fraction is normal (55-65%).   Labs/Other Tests and Data Reviewed:    EKG:  An ECG dated 10/23/2018 was personally reviewed today and demonstrated:  atrial fibrillation, HR 83, with no acute ST changes.   Recent Labs: 10/22/2018: Hemoglobin 10.7; Platelets 181 10/23/2018: B Natriuretic Peptide 104.0 11/06/2018: BUN 20; Creatinine, Ser 1.00; Potassium 4.2; Sodium 140   Recent Lipid Panel Lab Results  Component Value Date/Time   CHOL 126 10/23/2018 12:50 AM   CHOL 187 06/03/2018 07:49 AM   TRIG 53 10/23/2018 12:50 AM   HDL 74 10/23/2018 12:50 AM   HDL 77 06/03/2018 07:49 AM   CHOLHDL 1.7 10/23/2018 12:50 AM   LDLCALC 41 10/23/2018 12:50 AM   LDLCALC 95 06/03/2018 07:49 AM    Wt Readings from Last 3 Encounters:  10/30/18 236 lb (107 kg)  10/22/18 230 lb (104.3 kg)  06/16/18 218 lb (98.9 kg)     Objective:    Vital Signs:  BP 132/70   Pulse 79    General: Pleasant male sounding in NAD Psych: Normal affect.  Neuro: Alert and oriented X 3. Lungs:  Respirations sound regular and unlabored while talking on the phone.    ASSESSMENT & PLAN:    1. CAD - he is s/p CABG in 2003 with cath in 2017 showing patent LIMA-LAD, patent RIMA-dRCA, and 99% stenosis along SVG-RI treated with BMS. Recent NST in 10/2018  showed no significant perfusion defects, overall being a low-risk study. - he denies any recurrent chest pain or dyspnea on exertion since discharge. Remains on Lasix 40mg  daily and recent labs by his PCP showed stable kidney function and electrolytes.  - continue ASA (has been on this along with Coumadin per his Primary Cardiologist), BB, and statin therapy.   2. Chronic Atrial Fibrillation/ Use of Long-Term Anticoagulation - he does report intermittent palpitations which usually last for 30 seconds then resolve. Typically occurs before taking his AM medications. I encouraged him to check his HR and BP with these events. He is currently taking Cardizem CD 240mg  daily and Toprol-XL 75mg  daily. Recommend taking one in the AM and one in the PM if symptoms persist. Could also try dividing out Toprol-XL and taking 50mg  in AM and 25mg  in PM.  - he denies any evidence of active bleeding. Remains on Coumadin for anticoagulation.   3. HTN - BP has been well-controlled when checked at home. Continue current medication regimen.    4. HLD - FLP in 10/2018 showed total cholesterol of 126, HDL 74, and LDL 41. At goal of LDL less than 70. Continue Atorvastatin 80mg  daily.   5. COVID-19 Education: The signs and symptoms of COVID-19 were discussed with the patient. The importance of social distancing was discussed today.  Time:   Today, I have spent 15 minutes with the patient with telehealth technology discussing the above problems.     Medication Adjustments/Labs and Tests Ordered: Current medicines are reviewed at length with the patient today.  Concerns regarding medicines are outlined above.   Tests  Ordered: No orders of the defined types were placed in this encounter.   Medication Changes: No orders of the defined types were placed in this encounter.   Disposition:  Follow up with Dr. Claiborne Billings in 1 year.   Signed, Leonard Heritage, PA-C  12/18/2018 7:27 AM    [facility]

## 2018-12-17 NOTE — Patient Instructions (Signed)
Medication Instructions:  Your physician recommends that you continue on your current medications as directed. Please refer to the Current Medication list given to you today.   Labwork: NONE   Testing/Procedures: NONE   Follow-Up: Your physician wants you to follow-up in: 1 Year with Dr. Claiborne Billings.  You will receive a reminder letter in the mail two months in advance. If you don't receive a letter, please call our office to schedule the follow-up appointment.   Any Other Special Instructions Will Be Listed Below (If Applicable). Your physician has requested that you regularly monitor and record your blood pressure readings at home. Please use the same machine at the same time of day to check your readings and record them to bring to your follow-up visit.  Notify our office of your blood pressure becomes elevated.     If you need a refill on your cardiac medications before your next appointment, please call your pharmacy.  Thank you for choosing [facility]!

## 2018-12-18 ENCOUNTER — Encounter: Payer: Self-pay | Admitting: Student

## 2018-12-23 ENCOUNTER — Encounter (HOSPITAL_COMMUNITY)
Admission: RE | Admit: 2018-12-23 | Discharge: 2018-12-23 | Disposition: A | Discharge status: Home / Self Care | Payer: Medicare HMO | Source: Ambulatory Visit | Attending: Orthopedic Surgery | Admitting: Orthopedic Surgery

## 2018-12-23 ENCOUNTER — Encounter (HOSPITAL_COMMUNITY): Payer: Self-pay

## 2018-12-23 ENCOUNTER — Other Ambulatory Visit: Payer: Self-pay

## 2018-12-23 DIAGNOSIS — M25551 Pain in right hip: Secondary | ICD-10-CM | POA: Diagnosis not present

## 2018-12-23 MED ORDER — TECHNETIUM TC 99M MEDRONATE IV KIT
20.0000 | PACK | Freq: Once | INTRAVENOUS | Status: AC | PRN
  Administered 2018-12-23: 11:00:00 20 via INTRAVENOUS

## 2018-12-25 ENCOUNTER — Other Ambulatory Visit: Payer: Self-pay | Admitting: Cardiovascular Disease

## 2018-12-25 MED ORDER — DILTIAZEM HCL ER 240 MG PO CP24: 240.0000 mg | ORAL_CAPSULE | Freq: Every day | ORAL | 3 refills | Status: DC

## 2018-12-25 NOTE — Telephone Encounter (Signed)
Diltiazem 240 mg refilled.

## 2019-01-09 DIAGNOSIS — Z96641 Presence of right artificial hip joint: Secondary | ICD-10-CM | POA: Diagnosis not present

## 2019-01-09 DIAGNOSIS — S72111D Displaced fracture of greater trochanter of right femur, subsequent encounter for closed fracture with routine healing: Secondary | ICD-10-CM | POA: Diagnosis not present

## 2019-01-09 DIAGNOSIS — Z96642 Presence of left artificial hip joint: Secondary | ICD-10-CM | POA: Diagnosis not present

## 2019-01-19 ENCOUNTER — Other Ambulatory Visit: Payer: Self-pay | Admitting: Cardiovascular Disease

## 2019-01-19 ENCOUNTER — Other Ambulatory Visit: Payer: Self-pay | Admitting: Pharmacist

## 2019-01-19 DIAGNOSIS — Z7901 Long term (current) use of anticoagulants: Secondary | ICD-10-CM | POA: Diagnosis not present

## 2019-01-19 LAB — COAGUCHEK XS/INR WAIVED
INR: 2 — ABNORMAL HIGH (ref 0.9–1.1)
Prothrombin Time: 23.7 s

## 2019-01-19 NOTE — Telephone Encounter (Signed)
Patient overdue for INR check. Will check @ [facility] on Tuesday 5/19

## 2019-01-19 NOTE — Telephone Encounter (Signed)
Wrong patient

## 2019-01-20 ENCOUNTER — Ambulatory Visit (INDEPENDENT_AMBULATORY_CARE_PROVIDER_SITE_OTHER): Payer: Medicare HMO | Admitting: Pharmacist

## 2019-01-20 DIAGNOSIS — Z7901 Long term (current) use of anticoagulants: Secondary | ICD-10-CM | POA: Diagnosis not present

## 2019-01-20 DIAGNOSIS — I482 Chronic atrial fibrillation, unspecified: Secondary | ICD-10-CM

## 2019-01-20 MED ORDER — ATORVASTATIN CALCIUM 80 MG PO TABS: 80.0000 mg | ORAL_TABLET | Freq: Every day | ORAL | 1 refills | Status: DC

## 2019-01-20 MED ORDER — WARFARIN SODIUM 7.5 MG PO TABS: ORAL_TABLET | ORAL | 1 refills | Status: DC

## 2019-01-27 ENCOUNTER — Other Ambulatory Visit: Payer: Self-pay | Admitting: Cardiovascular Disease

## 2019-01-28 ENCOUNTER — Telehealth: Payer: Self-pay | Admitting: *Deleted

## 2019-01-28 ENCOUNTER — Other Ambulatory Visit: Payer: Self-pay | Admitting: Cardiovascular Disease

## 2019-01-28 NOTE — Telephone Encounter (Signed)
Please comment on coumadin. 

## 2019-01-28 NOTE — Telephone Encounter (Signed)
I called and spoke with the patient. He denies any new or worsening symptoms. Stress test in Feb 2020 low risk.

## 2019-01-28 NOTE — Telephone Encounter (Signed)
Pt takes warfarin for afib with CHADS2VASc score of 5 (age x2, CHF, HTN, CAD). Ok to hold warfarin for 5 days prior to procedure.

## 2019-01-28 NOTE — Telephone Encounter (Signed)
   Primary Cardiologist: [name], MD  Chart reviewed as part of pre-operative protocol coverage. Patient was contacted 01/28/2019 in reference to pre-operative risk assessment for pending surgery as outlined below.  [name] was last seen on 12/17/18 by Bernerd Pho PAC.  Since that day, TOMOYA RINGWALD has done well. He denies any further anginal symptoms. His stress test was encouraging in Feb 2020. He can't complete 4.0 METS given his hip pain. This will be his fifth hip surgery. He understands that he is at higher risk for cardiac complications.   Per our pharmacy staff: Pt takes warfarin for afib with CHADS2VASc score of 5 (age x2, CHF, HTN, CAD). Ok to hold warfarin for 5 days prior to procedure.  Therefore, based on ACC/AHA guidelines, the patient would be at moderate, but acceptable, risk for the planned procedure without further cardiovascular testing.   I will route this recommendation to the requesting party via Epic fax function and remove from pre-op pool.  Please call with questions.  [place], PA 01/28/2019, 4:23 PM

## 2019-01-28 NOTE — Telephone Encounter (Signed)
    [facility] Pre-operative Risk Assessment    Request for surgical clearance:  1. What type of surgery is being performed? Right total hip revision  2. When is this surgery scheduled? 03/18/2019  3. What type of clearance is required (medical clearance vs. Pharmacy clearance to hold med vs. Both)? both  4. Are there any medications that need to be held prior to surgery and how long? Warfarin-please advise  5. Practice name and name of physician performing surgery? Dr frank aluisio   6. What is your office phone number [phone]   7.   What is your office fax number [phone] attn kelly hancock  8.   Anesthesia type (None, local, MAC, general) ? choice   Leonard Cisneros 01/28/2019, 2:53 PM  _________________________________________________________________   (provider comments below)

## 2019-01-29 NOTE — Telephone Encounter (Signed)
Called patient - he is aware to stop warfarin x 5 days prior to procedure.

## 2019-02-02 ENCOUNTER — Other Ambulatory Visit: Payer: Self-pay

## 2019-02-02 ENCOUNTER — Ambulatory Visit (INDEPENDENT_AMBULATORY_CARE_PROVIDER_SITE_OTHER): Payer: Medicare HMO | Admitting: Family Medicine

## 2019-02-02 ENCOUNTER — Encounter (HOSPITAL_COMMUNITY)
Admission: RE | Admit: 2019-02-02 | Discharge: 2019-02-02 | Disposition: A | Discharge status: Home / Self Care | Payer: Medicare HMO | Source: Ambulatory Visit | Attending: Ophthalmology | Admitting: Ophthalmology

## 2019-02-02 ENCOUNTER — Encounter (HOSPITAL_COMMUNITY): Payer: Self-pay

## 2019-02-02 DIAGNOSIS — I878 Other specified disorders of veins: Secondary | ICD-10-CM

## 2019-02-02 DIAGNOSIS — H2522 Age-related cataract, morgagnian type, left eye: Secondary | ICD-10-CM | POA: Diagnosis not present

## 2019-02-02 DIAGNOSIS — I739 Peripheral vascular disease, unspecified: Secondary | ICD-10-CM

## 2019-02-02 DIAGNOSIS — I482 Chronic atrial fibrillation, unspecified: Secondary | ICD-10-CM

## 2019-02-02 DIAGNOSIS — I10 Essential (primary) hypertension: Secondary | ICD-10-CM | POA: Diagnosis not present

## 2019-02-02 MED ORDER — FUROSEMIDE 40 MG PO TABS: ORAL_TABLET | ORAL | 1 refills | Status: DC

## 2019-02-02 MED ORDER — POTASSIUM CHLORIDE ER 10 MEQ PO TBCR: EXTENDED_RELEASE_TABLET | ORAL | 1 refills | Status: DC

## 2019-02-02 NOTE — Progress Notes (Signed)
   Subjective:    Patient ID: Leonard Cisneros, male    DOB: [birth year], 78 y.o.   MRN: [id number]  Hypertension  This is a chronic problem. The current episode started more than 1 year ago. Risk factors for coronary artery disease include dyslipidemia and male gender. Treatments tried: diltiazem, lasix, imdur, toprol, altace. There are no compliance problems.    Virtual Visit via Video Note  I connected with Leonard Cisneros on 02/02/19 at 10:00 AM EDT by a video enabled telemedicine application and verified that I am speaking with the correct person using two identifiers.  Location: Patient: home Provider: office   I discussed the limitations of evaluation and management by telemedicine and the availability of in person appointments. The patient expressed understanding and agreed to proceed.  History of Present Illness:    Observations/Objective:   Assessment and Plan:   Follow Up Instructions:    I discussed the assessment and treatment plan with the patient. The patient was provided an opportunity to ask questions and all were answered. The patient agreed with the plan and demonstrated an understanding of the instructions.   The patient was advised to call back or seek an in-person evaluation if the symptoms worsen or if the condition fails to improve as anticipated.  I provided 25 minutes of non-face-to-face time during this encounter.  Blood pressure medicine and blood pressure levels reviewed today with patient. Compliant with blood pressure medicine. States does not miss a dose. No obvious side effects. Blood pressure generally good when checked elsewhere. Watching salt intake.   Leg swelling is overall better still tight some  bp numbers overall good  Patient due to have both hip surgery and cataract surgery within the next 6 weeks.  Compliant with diet.  On medication for high cholesterol.  Claims compliance does not miss a dose  Claims compliance with blood  pressure medicines.  Watching salt intake.  Blood pressures good once checked elsewhere  Has had ongoing issues with venous stasis.  Complicated by medication reactions and potential element of CHF.  Overall decent control with current dose of Lasix discussed Review of Systems No headache, no major weight loss or weight gain, no chest pain no back pain abdominal pain no change in bowel habits complete ROS otherwise negative     Objective:   Physical Exam   Virtual visit     Assessment & Plan:  Impression 1 hypertension good control discussed maintain same meds  2.  Peripheral edema multifactorial including venous stasis medication reactions and even potential element of CHF.  Clinically stable per patient on Lasix.  3.  Hyperlipidemia compliant with meds blood work done 6 months ago showed good results  4.  Peripheral arterial disease asymptomatic per patient currently  Medications refilled diet exercise discussed encourage recheck in 6 months for wellness plus chronic

## 2019-02-03 NOTE — Telephone Encounter (Signed)
Please see all notes. Clearance already done. Patient aware of plan.

## 2019-02-05 ENCOUNTER — Other Ambulatory Visit (HOSPITAL_COMMUNITY)
Admission: RE | Admit: 2019-02-05 | Discharge: 2019-02-05 | Disposition: A | Discharge status: Home / Self Care | Payer: Medicare HMO | Source: Ambulatory Visit | Attending: Ophthalmology | Admitting: Ophthalmology

## 2019-02-05 ENCOUNTER — Other Ambulatory Visit: Payer: Self-pay

## 2019-02-05 DIAGNOSIS — Z1159 Encounter for screening for other viral diseases: Secondary | ICD-10-CM | POA: Diagnosis not present

## 2019-02-06 LAB — NOVEL CORONAVIRUS, NAA (HOSP ORDER, SEND-OUT TO REF LAB; TAT 18-24 HRS): SARS-CoV-2, NAA: NOT DETECTED

## 2019-02-09 ENCOUNTER — Ambulatory Visit (HOSPITAL_COMMUNITY): Payer: Medicare HMO | Admitting: Anesthesiology

## 2019-02-09 ENCOUNTER — Encounter (HOSPITAL_COMMUNITY): Payer: Self-pay | Admitting: *Deleted

## 2019-02-09 ENCOUNTER — Other Ambulatory Visit: Payer: Self-pay

## 2019-02-09 ENCOUNTER — Ambulatory Visit (HOSPITAL_COMMUNITY)
Admission: RE | Admit: 2019-02-09 | Discharge: 2019-02-09 | Disposition: A | Discharge status: Home / Self Care | Payer: Medicare HMO | Attending: Ophthalmology | Admitting: Ophthalmology

## 2019-02-09 ENCOUNTER — Encounter (HOSPITAL_COMMUNITY)
Admission: RE | Disposition: A | Discharge status: Home / Self Care | Payer: Self-pay | Source: Home / Self Care | Attending: Ophthalmology

## 2019-02-09 DIAGNOSIS — H2512 Age-related nuclear cataract, left eye: Secondary | ICD-10-CM | POA: Diagnosis not present

## 2019-02-09 DIAGNOSIS — H2522 Age-related cataract, morgagnian type, left eye: Secondary | ICD-10-CM | POA: Diagnosis not present

## 2019-02-09 DIAGNOSIS — I2510 Atherosclerotic heart disease of native coronary artery without angina pectoris: Secondary | ICD-10-CM | POA: Diagnosis not present

## 2019-02-09 DIAGNOSIS — I509 Heart failure, unspecified: Secondary | ICD-10-CM | POA: Diagnosis not present

## 2019-02-09 DIAGNOSIS — Z951 Presence of aortocoronary bypass graft: Secondary | ICD-10-CM | POA: Insufficient documentation

## 2019-02-09 DIAGNOSIS — H259 Unspecified age-related cataract: Secondary | ICD-10-CM | POA: Diagnosis not present

## 2019-02-09 DIAGNOSIS — Z87891 Personal history of nicotine dependence: Secondary | ICD-10-CM | POA: Diagnosis not present

## 2019-02-09 DIAGNOSIS — I110 Hypertensive heart disease with heart failure: Secondary | ICD-10-CM | POA: Diagnosis not present

## 2019-02-09 DIAGNOSIS — I4891 Unspecified atrial fibrillation: Secondary | ICD-10-CM | POA: Diagnosis not present

## 2019-02-09 DIAGNOSIS — I739 Peripheral vascular disease, unspecified: Secondary | ICD-10-CM | POA: Insufficient documentation

## 2019-02-09 HISTORY — PX: CATARACT EXTRACTION W/PHACO: SHX586

## 2019-02-09 SURGERY — PHACOEMULSIFICATION, CATARACT, WITH IOL INSERTION
Anesthesia: Monitor Anesthesia Care | Site: Eye | Laterality: Left

## 2019-02-09 MED ORDER — CYCLOPENTOLATE-PHENYLEPHRINE 0.2-1 % OP SOLN
1.0000 [drp] | OPHTHALMIC | Status: AC
  Administered 2019-02-09 (×3): 1 [drp] via OPHTHALMIC

## 2019-02-09 MED ORDER — LACTATED RINGERS IV SOLN: INTRAVENOUS | Status: DC

## 2019-02-09 MED ORDER — PROVISC 10 MG/ML IO SOLN
INTRAOCULAR | Status: DC | PRN
  Administered 2019-02-09: 0.85 mL via INTRAOCULAR

## 2019-02-09 MED ORDER — NEOMYCIN-POLYMYXIN-DEXAMETH 3.5-10000-0.1 OP SUSP
OPHTHALMIC | Status: DC | PRN
  Administered 2019-02-09: 2 [drp] via OPHTHALMIC

## 2019-02-09 MED ORDER — EPINEPHRINE PF 1 MG/ML IJ SOLN
INTRAOCULAR | Status: DC | PRN
  Administered 2019-02-09: 500 mL

## 2019-02-09 MED ORDER — TRYPAN BLUE 0.06 % OP SOLN
OPHTHALMIC | Status: DC | PRN
  Administered 2019-02-09: 0.5 mL via INTRAOCULAR

## 2019-02-09 MED ORDER — POVIDONE-IODINE 5 % OP SOLN
OPHTHALMIC | Status: DC | PRN
  Administered 2019-02-09: 1 via OPHTHALMIC

## 2019-02-09 MED ORDER — LIDOCAINE HCL 3.5 % OP GEL
1.0000 "application " | Freq: Once | OPHTHALMIC | Status: AC
  Administered 2019-02-09: 1 via OPHTHALMIC

## 2019-02-09 MED ORDER — BSS IO SOLN
INTRAOCULAR | Status: DC | PRN
  Administered 2019-02-09: 15 mL

## 2019-02-09 MED ORDER — SODIUM HYALURONATE 23 MG/ML IO SOLN
INTRAOCULAR | Status: DC | PRN
  Administered 2019-02-09: 0.6 mL via INTRAOCULAR

## 2019-02-09 MED ORDER — PHENYLEPHRINE HCL 2.5 % OP SOLN
1.0000 [drp] | OPHTHALMIC | Status: AC
  Administered 2019-02-09 (×3): 1 [drp] via OPHTHALMIC

## 2019-02-09 MED ORDER — TRYPAN BLUE 0.06 % OP SOLN
OPHTHALMIC | Status: AC
  Filled 2019-02-09: qty 0.5

## 2019-02-09 MED ORDER — LIDOCAINE HCL (PF) 1 % IJ SOLN
INTRAOCULAR | Status: DC | PRN
  Administered 2019-02-09: 1 mL via OPHTHALMIC

## 2019-02-09 MED ORDER — TETRACAINE HCL 0.5 % OP SOLN
1.0000 [drp] | OPHTHALMIC | Status: AC
  Administered 2019-02-09 (×3): 1 [drp] via OPHTHALMIC

## 2019-02-09 SURGICAL SUPPLY — 15 items
CLOTH BEACON ORANGE TIMEOUT ST (SAFETY) ×1 IMPLANT
GLOVE BIOGEL PI IND STRL 6.5 (GLOVE) IMPLANT
GLOVE BIOGEL PI IND STRL 7.0 (GLOVE) IMPLANT
GLOVE BIOGEL PI INDICATOR 6.5 (GLOVE) ×1
GLOVE BIOGEL PI INDICATOR 7.0 (GLOVE) ×1
LENS ALC ACRYL/TECN (Ophthalmic Related) ×1 IMPLANT
NDL HYPO 18GX1.5 BLUNT FILL (NEEDLE) IMPLANT
NEEDLE HYPO 18GX1.5 BLUNT FILL (NEEDLE) ×2 IMPLANT
PAD ARMBOARD 7.5X6 YLW CONV (MISCELLANEOUS) ×1 IMPLANT
SHIELD EYE LENSE ONLY DISP (GAUZE/BANDAGES/DRESSINGS) ×1 IMPLANT
SYR TB 1ML LL NO SAFETY (SYRINGE) ×1 IMPLANT
TAPE SURG TRANSPORE 1 IN (GAUZE/BANDAGES/DRESSINGS) IMPLANT
TAPE SURGICAL TRANSPORE 1 IN (GAUZE/BANDAGES/DRESSINGS) ×1
VISCOELASTIC ADDITIONAL (OPHTHALMIC RELATED) ×1 IMPLANT
WATER STERILE IRR 250ML POUR (IV SOLUTION) ×1 IMPLANT

## 2019-02-09 NOTE — Anesthesia Preprocedure Evaluation (Signed)
Anesthesia Evaluation  Patient identified by MRN, date of birth, ID band Patient awake    Reviewed: Allergy & Precautions, NPO status , [name] Chart, lab work & pertinent test results, reviewed documented beta blocker date and time   Airway Mallampati: III  TM Distance: >3 FB Neck ROM: Full    Dental no notable dental hx. (+) Teeth Intact   Pulmonary neg pulmonary ROS, former smoker,    Pulmonary exam normal breath sounds clear to auscultation       Cardiovascular Exercise Tolerance: Poor hypertension, Pt. on medications and Pt. on home beta blockers + angina with exertion + CAD, + CABG, + Peripheral Vascular Disease and +CHF  Normal cardiovascular exam+ dysrhythmias Atrial Fibrillation II Rhythm:Regular Rate:Normal  Limited ET-denies SL NTG after 11/2018. C/w imdur /Dilt/ lasix/ toprol /altace    Neuro/Psych Has a hip that needs fixing -using crutches currently   Neuromuscular disease negative psych ROS   GI/Hepatic negative GI ROS, Neg liver ROS,   Endo/Other  negative endocrine ROS  Renal/GU negative Renal ROS  negative genitourinary   Musculoskeletal negative musculoskeletal ROS (+)   Abdominal   Peds negative pediatric ROS (+)  Hematology negative hematology ROS (+)   Anesthesia Other Findings   Reproductive/Obstetrics negative OB ROS                             Anesthesia Physical Anesthesia Plan  ASA: III  Anesthesia Plan: MAC   Post-op Pain Management:    Induction: Intravenous  PONV Risk Score and Plan:   Airway Management Planned: Nasal Cannula and Simple Face Mask  Additional Equipment:   Intra-op Plan:   Post-operative Plan:   Informed Consent: I have reviewed the patients History and Physical, chart, labs and discussed the procedure including the risks, benefits and alternatives for the proposed anesthesia with the patient or authorized representative who has  indicated his/her understanding and acceptance.     Dental advisory given  Plan Discussed with: CRNA  Anesthesia Plan Comments: (Plan Full PPE use  Plan MAC with GA back up as needed -WTP)        Anesthesia Quick Evaluation

## 2019-02-09 NOTE — Transfer of Care (Signed)
Immediate Anesthesia Transfer of Care Note  Patient: Leonard Cisneros  Procedure(s) Performed: CATARACT EXTRACTION PHACO AND INTRAOCULAR LENS PLACEMENT (IOC) (Left Eye)  Patient Location: PACU  Anesthesia Type:MAC  Level of Consciousness: awake, alert  and oriented  Airway & Oxygen Therapy: Patient Spontanous Breathing  Post-op Assessment: Report given to RN and Post -op Vital signs reviewed and stable  Post vital signs: Reviewed and stable  Last Vitals:  Vitals Value Taken Time  BP    Temp    Pulse    Resp    SpO2      Last Pain:  Vitals:   02/09/19 1009  TempSrc: Oral  PainSc: 0-No pain      Patients Stated Pain Goal: 7 (19/75/88 3254)  Complications: No apparent anesthesia complications

## 2019-02-09 NOTE — Anesthesia Postprocedure Evaluation (Signed)
Anesthesia Post Note  Patient: Leonard Cisneros  Procedure(s) Performed: CATARACT EXTRACTION PHACO AND INTRAOCULAR LENS PLACEMENT (IOC) (Left Eye)  Patient location during evaluation: PACU Anesthesia Type: MAC Level of consciousness: awake and awake and alert Pain management: pain level controlled Vital Signs Assessment: post-procedure vital signs reviewed and stable Respiratory status: spontaneous breathing Cardiovascular status: stable Postop Assessment: adequate PO intake Anesthetic complications: no     Last Vitals:  Vitals:   02/09/19 1009 02/09/19 1103  BP: 119/67 129/71  Pulse:  82  Resp:  16  Temp: 36.7 C 36.7 C  SpO2: 95% 96%    Last Pain:  Vitals:   02/09/19 1103  TempSrc: Oral  PainSc: 0-No pain                 Everette Rank

## 2019-02-09 NOTE — H&P (Signed)
The H and P was reviewed and updated. The patient was examined.  No changes were found after exam.  The surgical eye was marked.  

## 2019-02-09 NOTE — Discharge Instructions (Addendum)
Please discharge patient when stable, will follow up today with Dr. Wrzosek at the [facility] office immediately following discharge.  Leave shield in place until visit.  All paperwork with discharge instructions will be given at the office. ° °

## 2019-02-09 NOTE — Op Note (Signed)
Date of procedure: 02/09/19  Pre-operative diagnosis: Mature, Visually significant age-related cataract, Left Eye (H25.22)  Post-operative diagnosis: Mature Visually significant age-related cataract, Left Eye  Procedure: Complex Removal of cataract via phacoemulsification and insertion of intra-ocular lens AMO PCB00 +21.5D into the capsular bag of the Left Eye  Attending surgeon: Gerda Diss. Lovelle Deitrick, MD, MA  Anesthesia: MAC, Topical Akten  Complications: None  Estimated Blood Loss: <17m (minimal)  Specimens: None  Implants: As above  Indications:  Mature Visually significant age-related cataract, Left Eye  Procedure:  The patient was seen and identified in the pre-operative area. The operative eye was identified and dilated.  The operative eye was marked.  Topical anesthesia was administered to the operative eye.     The patient was then to the operative suite and placed in the supine position.  A timeout was performed confirming the patient, procedure to be performed, and all other relevant information.   The patient's face was prepped and draped in the usual fashion for intra-ocular surgery.  A lid speculum was placed into the operative eye and the surgical microscope moved into place and focused.  A lack of red reflex due to a mature cataract was confirmed.  An inferotemporal paracentesis was created using a 20 gauge paracentesis blade.  Vision blue was injected into the anterior chamber.  Shugarcaine was injected into the anterior chamber.  Viscoelastic was injected into the anterior chamber.  A temporal clear-corneal main wound incision was created using a 2.472mmicrokeratome.  A continuous curvilinear capsulorrhexis was initiated using an irrigating cystitome and completed using capsulorrhexis forceps.  Hydrodissection and hydrodeliniation were performed.  Viscoelastic was injected into the anterior chamber.  A phacoemulsification handpiece and a chopper as a second instrument were used  to remove the nucleus and epinucleus. The irrigation/aspiration handpiece was used to remove any remaining cortical material.   The capsular bag was reinflated with viscoelastic, checked, and found to be intact. The intraocular lens was inserted into the capsular bag and dialed into place using a kuglen hook.  The irrigation/aspiration handpiece was used to remove any remaining viscoelastic.  The clear corneal wound and paracentesis wounds were then hydrated and checked with Weck-Cels to be watertight.  The lid-speculum and drape was removed, and the patient's face was cleaned with a wet and dry 4x4.  Maxitrol was instilled in the eye before a clear shield was taped over the eye. The patient was taken to the post-operative care unit in good condition, having tolerated the procedure well.  Post-Op Instructions: [facility] and will receive all other orders and instructions.

## 2019-02-10 ENCOUNTER — Encounter (HOSPITAL_COMMUNITY): Payer: Self-pay | Admitting: Ophthalmology

## 2019-02-16 DIAGNOSIS — H25811 Combined forms of age-related cataract, right eye: Secondary | ICD-10-CM | POA: Diagnosis not present

## 2019-02-18 ENCOUNTER — Encounter (HOSPITAL_COMMUNITY)
Admission: RE | Admit: 2019-02-18 | Discharge: 2019-02-18 | Disposition: A | Discharge status: Home / Self Care | Payer: Medicare HMO | Source: Ambulatory Visit | Attending: Ophthalmology | Admitting: Ophthalmology

## 2019-02-18 ENCOUNTER — Encounter (HOSPITAL_COMMUNITY): Payer: Self-pay

## 2019-02-18 ENCOUNTER — Other Ambulatory Visit: Payer: Self-pay

## 2019-02-18 MED ORDER — PROMETHAZINE HCL 25 MG/ML IJ SOLN: 6.2500 mg | INTRAMUSCULAR | Status: DC | PRN

## 2019-02-18 MED ORDER — HYDROCODONE-ACETAMINOPHEN 7.5-325 MG PO TABS: 1.0000 | ORAL_TABLET | Freq: Once | ORAL | Status: DC | PRN

## 2019-02-18 MED ORDER — HYDROMORPHONE HCL 1 MG/ML IJ SOLN: 0.2500 mg | INTRAMUSCULAR | Status: DC | PRN

## 2019-02-18 MED ORDER — MIDAZOLAM HCL 2 MG/2ML IJ SOLN: 0.5000 mg | Freq: Once | INTRAMUSCULAR | Status: DC | PRN

## 2019-02-19 ENCOUNTER — Other Ambulatory Visit (HOSPITAL_COMMUNITY)
Admission: RE | Admit: 2019-02-19 | Discharge: 2019-02-19 | Disposition: A | Discharge status: Home / Self Care | Payer: Medicare HMO | Source: Ambulatory Visit | Attending: Ophthalmology | Admitting: Ophthalmology

## 2019-02-19 DIAGNOSIS — Z1159 Encounter for screening for other viral diseases: Secondary | ICD-10-CM | POA: Diagnosis not present

## 2019-02-20 LAB — NOVEL CORONAVIRUS, NAA (HOSP ORDER, SEND-OUT TO REF LAB; TAT 18-24 HRS): SARS-CoV-2, NAA: NOT DETECTED

## 2019-02-23 ENCOUNTER — Ambulatory Visit (HOSPITAL_COMMUNITY): Payer: Medicare HMO | Admitting: Anesthesiology

## 2019-02-23 ENCOUNTER — Encounter (HOSPITAL_COMMUNITY): Payer: Self-pay | Admitting: *Deleted

## 2019-02-23 ENCOUNTER — Encounter (HOSPITAL_COMMUNITY)
Admission: RE | Disposition: A | Discharge status: Home / Self Care | Payer: Self-pay | Source: Home / Self Care | Attending: Ophthalmology

## 2019-02-23 ENCOUNTER — Ambulatory Visit (HOSPITAL_COMMUNITY)
Admission: RE | Admit: 2019-02-23 | Discharge: 2019-02-23 | Disposition: A | Discharge status: Home / Self Care | Payer: Medicare HMO | Attending: Ophthalmology | Admitting: Ophthalmology

## 2019-02-23 ENCOUNTER — Other Ambulatory Visit: Payer: Self-pay

## 2019-02-23 DIAGNOSIS — I509 Heart failure, unspecified: Secondary | ICD-10-CM | POA: Insufficient documentation

## 2019-02-23 DIAGNOSIS — H259 Unspecified age-related cataract: Secondary | ICD-10-CM | POA: Diagnosis present

## 2019-02-23 DIAGNOSIS — Z87891 Personal history of nicotine dependence: Secondary | ICD-10-CM | POA: Diagnosis not present

## 2019-02-23 DIAGNOSIS — H2511 Age-related nuclear cataract, right eye: Secondary | ICD-10-CM | POA: Diagnosis not present

## 2019-02-23 DIAGNOSIS — G709 Myoneural disorder, unspecified: Secondary | ICD-10-CM | POA: Insufficient documentation

## 2019-02-23 DIAGNOSIS — I25119 Atherosclerotic heart disease of native coronary artery with unspecified angina pectoris: Secondary | ICD-10-CM | POA: Insufficient documentation

## 2019-02-23 DIAGNOSIS — Z7982 Long term (current) use of aspirin: Secondary | ICD-10-CM | POA: Diagnosis not present

## 2019-02-23 DIAGNOSIS — I739 Peripheral vascular disease, unspecified: Secondary | ICD-10-CM | POA: Insufficient documentation

## 2019-02-23 DIAGNOSIS — Z79899 Other long term (current) drug therapy: Secondary | ICD-10-CM | POA: Insufficient documentation

## 2019-02-23 DIAGNOSIS — I110 Hypertensive heart disease with heart failure: Secondary | ICD-10-CM | POA: Diagnosis not present

## 2019-02-23 DIAGNOSIS — I4891 Unspecified atrial fibrillation: Secondary | ICD-10-CM | POA: Insufficient documentation

## 2019-02-23 DIAGNOSIS — Z9842 Cataract extraction status, left eye: Secondary | ICD-10-CM | POA: Insufficient documentation

## 2019-02-23 DIAGNOSIS — I2510 Atherosclerotic heart disease of native coronary artery without angina pectoris: Secondary | ICD-10-CM | POA: Diagnosis not present

## 2019-02-23 DIAGNOSIS — E7800 Pure hypercholesterolemia, unspecified: Secondary | ICD-10-CM | POA: Insufficient documentation

## 2019-02-23 DIAGNOSIS — Z9841 Cataract extraction status, right eye: Secondary | ICD-10-CM | POA: Diagnosis not present

## 2019-02-23 DIAGNOSIS — Z961 Presence of intraocular lens: Secondary | ICD-10-CM | POA: Diagnosis not present

## 2019-02-23 DIAGNOSIS — I10 Essential (primary) hypertension: Secondary | ICD-10-CM | POA: Diagnosis not present

## 2019-02-23 DIAGNOSIS — H25811 Combined forms of age-related cataract, right eye: Secondary | ICD-10-CM | POA: Diagnosis not present

## 2019-02-23 HISTORY — PX: CATARACT EXTRACTION W/PHACO: SHX586

## 2019-02-23 SURGERY — PHACOEMULSIFICATION, CATARACT, WITH IOL INSERTION
Anesthesia: Monitor Anesthesia Care | Site: Eye | Laterality: Right

## 2019-02-23 MED ORDER — POVIDONE-IODINE 5 % OP SOLN
OPHTHALMIC | Status: DC | PRN
  Administered 2019-02-23: 1 via OPHTHALMIC

## 2019-02-23 MED ORDER — NEOMYCIN-POLYMYXIN-DEXAMETH 3.5-10000-0.1 OP SUSP
OPHTHALMIC | Status: DC | PRN
  Administered 2019-02-23: 2 [drp] via OPHTHALMIC

## 2019-02-23 MED ORDER — EPINEPHRINE PF 1 MG/ML IJ SOLN
INTRAMUSCULAR | Status: AC
  Filled 2019-02-23: qty 2

## 2019-02-23 MED ORDER — CYCLOPENTOLATE-PHENYLEPHRINE 0.2-1 % OP SOLN
1.0000 [drp] | OPHTHALMIC | Status: AC
  Administered 2019-02-23 (×3): 1 [drp] via OPHTHALMIC

## 2019-02-23 MED ORDER — SODIUM HYALURONATE 23 MG/ML IO SOLN
INTRAOCULAR | Status: DC | PRN
  Administered 2019-02-23: 0.6 mL via INTRAOCULAR

## 2019-02-23 MED ORDER — TETRACAINE HCL 0.5 % OP SOLN
1.0000 [drp] | OPHTHALMIC | Status: AC
  Administered 2019-02-23 (×3): 1 [drp] via OPHTHALMIC

## 2019-02-23 MED ORDER — PHENYLEPHRINE HCL 2.5 % OP SOLN
1.0000 [drp] | OPHTHALMIC | Status: AC
  Administered 2019-02-23 (×3): 1 [drp] via OPHTHALMIC

## 2019-02-23 MED ORDER — LIDOCAINE HCL 3.5 % OP GEL
1.0000 "application " | Freq: Once | OPHTHALMIC | Status: AC
  Administered 2019-02-23: 1 via OPHTHALMIC

## 2019-02-23 MED ORDER — BSS IO SOLN
INTRAOCULAR | Status: DC | PRN
  Administered 2019-02-23: 15 mL via INTRAOCULAR

## 2019-02-23 MED ORDER — EPINEPHRINE PF 1 MG/ML IJ SOLN
INTRAOCULAR | Status: DC | PRN
  Administered 2019-02-23: 10:00:00 500 mL

## 2019-02-23 MED ORDER — PROVISC 10 MG/ML IO SOLN
INTRAOCULAR | Status: DC | PRN
  Administered 2019-02-23: 0.85 mL via INTRAOCULAR

## 2019-02-23 MED ORDER — LIDOCAINE HCL (PF) 1 % IJ SOLN
INTRAOCULAR | Status: DC | PRN
  Administered 2019-02-23: 10:00:00 1 mL via OPHTHALMIC

## 2019-02-23 SURGICAL SUPPLY — 13 items
CLOTH BEACON ORANGE TIMEOUT ST (SAFETY) ×1 IMPLANT
EYE SHIELD UNIVERSAL CLEAR (GAUZE/BANDAGES/DRESSINGS) ×1 IMPLANT
GLOVE BIOGEL PI IND STRL 7.0 (GLOVE) IMPLANT
GLOVE BIOGEL PI INDICATOR 7.0 (GLOVE) ×2
LENS ALC ACRYL/TECN (Ophthalmic Related) ×1 IMPLANT
NDL HYPO 18GX1.5 BLUNT FILL (NEEDLE) IMPLANT
NEEDLE HYPO 18GX1.5 BLUNT FILL (NEEDLE) ×2 IMPLANT
PAD ARMBOARD 7.5X6 YLW CONV (MISCELLANEOUS) ×1 IMPLANT
SYR TB 1ML LL NO SAFETY (SYRINGE) ×1 IMPLANT
TAPE SURG TRANSPORE 1 IN (GAUZE/BANDAGES/DRESSINGS) IMPLANT
TAPE SURGICAL TRANSPORE 1 IN (GAUZE/BANDAGES/DRESSINGS) ×1
VISCOELASTIC ADDITIONAL (OPHTHALMIC RELATED) ×1 IMPLANT
WATER STERILE IRR 250ML POUR (IV SOLUTION) ×1 IMPLANT

## 2019-02-23 NOTE — Transfer of Care (Signed)
Immediate Anesthesia Transfer of Care Note  Patient: Leonard Cisneros  Procedure(s) Performed: CATARACT EXTRACTION PHACO AND INTRAOCULAR LENS PLACEMENT RIGHT EYE (Right Eye)  Patient Location: PACU  Anesthesia Type:MAC  Level of Consciousness: awake  Airway & Oxygen Therapy: Patient Spontanous Breathing  Post-op Assessment: Report given to RN  Post vital signs: Reviewed  Last Vitals:  Vitals Value Taken Time  BP    Temp    Pulse    Resp    SpO2      Last Pain:  Vitals:   02/23/19 0844  TempSrc: Oral  PainSc: 0-No pain         Complications: No apparent anesthesia complications

## 2019-02-23 NOTE — Anesthesia Postprocedure Evaluation (Signed)
Anesthesia Post Note  Patient: Leonard Cisneros  Procedure(s) Performed: CATARACT EXTRACTION PHACO AND INTRAOCULAR LENS PLACEMENT RIGHT EYE (Right Eye)  Patient location during evaluation: PACU Anesthesia Type: MAC Level of consciousness: awake and alert and oriented Pain management: pain level controlled Vital Signs Assessment: post-procedure vital signs reviewed and stable Respiratory status: spontaneous breathing Cardiovascular status: blood pressure returned to baseline and stable Postop Assessment: no apparent nausea or vomiting and adequate PO intake Anesthetic complications: no     Last Vitals:  Vitals:   02/23/19 0844  BP: 114/65  Pulse: 72  Resp: (!) 21  Temp: 36.7 C  SpO2: 95%    Last Pain:  Vitals:   02/23/19 0844  TempSrc: Oral  PainSc: 0-No pain                 Oda Lansdowne

## 2019-02-23 NOTE — Anesthesia Preprocedure Evaluation (Signed)
Anesthesia Evaluation    Airway Mallampati: II       Dental  (+) Teeth Intact   Pulmonary former smoker,    breath sounds clear to auscultation       Cardiovascular hypertension, + angina with exertion + CAD, + Peripheral Vascular Disease and +CHF   Rhythm:regular     Neuro/Psych  Neuromuscular disease    GI/Hepatic   Endo/Other    Renal/GU      Musculoskeletal   Abdominal   Peds  Hematology   Anesthesia Other Findings   Reproductive/Obstetrics                             Anesthesia Physical Anesthesia Plan  ASA: IV  Anesthesia Plan: MAC   Post-op Pain Management:    Induction:   PONV Risk Score and Plan:   Airway Management Planned:   Additional Equipment:   Intra-op Plan:   Post-operative Plan:   Informed Consent: I have reviewed the patients History and Physical, chart, labs and discussed the procedure including the risks, benefits and alternatives for the proposed anesthesia with the patient or authorized representative who has indicated his/her understanding and acceptance.       Plan Discussed with: Anesthesiologist  Anesthesia Plan Comments:         Anesthesia Quick Evaluation

## 2019-02-23 NOTE — Op Note (Signed)
Date of procedure: 02/23/19  Pre-operative diagnosis: Visually significant age-related cataract, Right Eye (H25.811)  Post-operative diagnosis: Visually significant age-related cataract, Right Eye  Procedure: Removal of cataract via phacoemulsification and insertion of intra-ocular lens [employer] PCB00  +20.5D into the capsular bag of the Right Eye  Attending surgeon: Gerda Diss. Sabiha Sura, MD, MA  Anesthesia: MAC, Topical Akten  Complications: None  Estimated Blood Loss: <26m (minimal)  Specimens: None  Implants: As above  Indications:  Visually significant age-related cataract, Right Eye  Procedure:  The patient was seen and identified in the pre-operative area. The operative eye was identified and dilated.  The operative eye was marked.  Topical anesthesia was administered to the operative eye.     The patient was then to the operative suite and placed in the supine position.  A timeout was performed confirming the patient, procedure to be performed, and all other relevant information.   The patient's face was prepped and draped in the usual fashion for intra-ocular surgery.  A lid speculum was placed into the operative eye and the surgical microscope moved into place and focused.  A superotemporal paracentesis was created using a 20 gauge paracentesis blade.  Shugarcaine was injected into the anterior chamber.  Viscoelastic was injected into the anterior chamber.  A temporal clear-corneal main wound incision was created using a 2.446mmicrokeratome.  A continuous curvilinear capsulorrhexis was initiated using an irrigating cystitome and completed using capsulorrhexis forceps.  Hydrodissection and hydrodeliniation were performed.  Viscoelastic was injected into the anterior chamber.  A phacoemulsification handpiece and a chopper as a second instrument were used to remove the nucleus and epinucleus. The irrigation/aspiration handpiece was used to remove any remaining cortical  material.   The capsular bag was reinflated with viscoelastic, checked, and found to be intact.  The intraocular lens was inserted into the capsular bag and dialed into place using a Kuglen hook.  The irrigation/aspiration handpiece was used to remove any remaining viscoelastic.  The clear corneal wound and paracentesis wounds were then hydrated and checked with Weck-Cels to be watertight.  The lid-speculum and drape was removed, and the patient's face was cleaned with a wet and dry 4x4.  Maxitrol was instilled in the eye before a clear shield was taped over the eye. The patient was taken to the post-operative care unit in good condition, having tolerated the procedure well.  Post-Op Instructions: [facility] and will receive all other orders and instructions.

## 2019-02-23 NOTE — Discharge Instructions (Addendum)
PATIENT INSTRUCTIONS °POST-ANESTHESIA ° °IMMEDIATELY FOLLOWING SURGERY:  Do not drive or operate machinery for the first twenty four hours after surgery.  Do not make any important decisions for twenty four hours after surgery or while taking narcotic pain medications or sedatives.  If you develop intractable nausea and vomiting or a severe headache please notify your doctor immediately. ° °FOLLOW-UP:  Please make an appointment with your surgeon as instructed. You do not need to follow up with anesthesia unless specifically instructed to do so. ° °WOUND CARE INSTRUCTIONS (if applicable):  Keep a dry clean dressing on the anesthesia/puncture wound site if there is drainage.  Once the wound has quit draining you may leave it open to air.  Generally you should leave the bandage intact for twenty four hours unless there is drainage.  If the epidural site drains for more than 36-48 hours please call the anesthesia department. ° °QUESTIONS?:  Please feel free to call your physician or the hospital operator if you have any questions, and they will be happy to assist you.    ° ° °Please discharge patient when stable, will follow up today with Dr. Wrzosek at the [facility] office immediately following discharge.  Leave shield in place until visit.  All paperwork with discharge instructions will be given at the office. ° °

## 2019-02-23 NOTE — H&P (Signed)
The H and P was reviewed and updated. The patient was examined.  No changes were found after exam.  The surgical eye was marked.  

## 2019-02-24 ENCOUNTER — Encounter (HOSPITAL_COMMUNITY): Payer: Self-pay | Admitting: Ophthalmology

## 2019-03-04 ENCOUNTER — Other Ambulatory Visit: Payer: Self-pay | Admitting: Cardiovascular Disease

## 2019-03-04 DIAGNOSIS — Z7901 Long term (current) use of anticoagulants: Secondary | ICD-10-CM | POA: Diagnosis not present

## 2019-03-05 LAB — PROTIME-INR
INR: 2.6 — ABNORMAL HIGH (ref 0.8–1.2)
Prothrombin Time: 26.1 s — ABNORMAL HIGH (ref 9.1–12.0)

## 2019-03-09 ENCOUNTER — Ambulatory Visit (INDEPENDENT_AMBULATORY_CARE_PROVIDER_SITE_OTHER): Payer: Medicare HMO | Admitting: Pharmacist

## 2019-03-09 DIAGNOSIS — I482 Chronic atrial fibrillation, unspecified: Secondary | ICD-10-CM

## 2019-03-09 DIAGNOSIS — Z7901 Long term (current) use of anticoagulants: Secondary | ICD-10-CM

## 2019-03-09 NOTE — H&P (View-Only) (Signed)
TOTAL HIP REVISION ADMISSION H&P  Patient is admitted for right revision total hip arthroplasty.  Subjective:  Chief Complaint: right hip pain  HPI: Leonard Cisneros, 78 y.o. 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 The indications for the revision total hip arthroplasty are loosening of one or more components.  He has significant weight bearing thigh pain which has persisted for approximately 8 months and is not getting better despite limited weight bearing. The bone scan does not show evidence of fracture, but revealed some increased activity at the tip of the stem and proximally. Given his overall clinical picture, this suggests loosening of the stem. The patient currently has a diagnosis of right total hip arthroplasty loosening and has failed conservative treatments including activity modification and use of assistive devices. The patient denies an active infection.   Patient Active Problem List   Diagnosis Date Noted  . Chest pain 10/22/2018  . Physical deconditioning 04/26/2018  . Gait instability 04/26/2018  . Closed fracture of greater trochanter of right femur with routine healing 04/20/18 04/25/2018  . Leg pain 04/24/2018  . Fall 04/21/2018  . PAD (peripheral artery disease) ([place]) 04/21/2018  . Hx of adenomatous colonic polyps 08/20/2017  . Ischemic cardiomyopathy 02/02/2016  . Obesity 02/02/2016  . CAD S/P percutaneous coronary angioplasty 02/01/2016  . Exertional angina ([place]) 01/28/2016  . Hyperlipidemia LDL goal <70 01/28/2016  . Neuropathic pain 06/13/2015  . Mild obesity 04/20/2015  . Essential hypertension 04/20/2015  . Unspecified hereditary and idiopathic peripheral neuropathy 04/04/2014  . Foreign body (FB) in soft tissue 03/31/2014  . Impaired glucose metabolism 03/31/2014   . Obesity (BMI 30-39.9) 03/31/2014  . Hx of CABG 01/04/2014  . Rotator cuff tear, left 10/22/2013  . Rotator cuff syndrome of left shoulder 10/22/2013  . Chronic atrial fibrillation 06/18/2013  . Long term (current) use of anticoagulants 06/18/2013  . Screening for colon cancer 06/18/2012  . High risk medication use 06/18/2012   Past Medical History:  Diagnosis Date  . Atrial flutter ([place])    a. s/p remote ablation by Dr. [name].  [name] CAD (coronary artery disease)    a. s/p CABG 2003. b. 01/2016: unstable angina - s/p BMS to SVG-ramus intermediate, patent LIMA-mLAD, patent [id number]-dRCA.   [name] CHF (congestive heart failure) ([place])   . Chronic atrial fibrillation    a. initially post op CABG then chronic. On Coumadin.  . Chronic lower back pain   . Essential hypertension   . High cholesterol   . Ischemic cardiomyopathy    a. Cath 01/2016 -  EF 50-55% with mild distal inferior hypocontractility.  [name] Unspecified hereditary and idiopathic peripheral neuropathy 04/04/2014    Past Surgical History:  Procedure Laterality Date  . BACK SURGERY    . CARDIAC CATHETERIZATION  ~ 2000; 2003  . CARDIAC CATHETERIZATION N/A 02/01/2016   Procedure: Left Heart Cath and Cors/Grafts Angiography;  Surgeon: [name], MD;  Location: [place] [facility];  Service: Cardiovascular;  Laterality: N/A;  . CARDIAC CATHETERIZATION N/A 02/01/2016   Procedure: Coronary Stent Intervention;  Surgeon: [name], MD;  Location: [place] [facility];  Service: Cardiovascular;  Laterality: N/A;  . CATARACT EXTRACTION W/PHACO Left 02/09/2019   Procedure: CATARACT EXTRACTION PHACO AND INTRAOCULAR LENS PLACEMENT (IOC);  Surgeon: [name], MD;  Location: [facility];  Service:  Ophthalmology;  Laterality: Left;  CDE: 27.70  . CATARACT EXTRACTION W/PHACO Right 02/23/2019   Procedure: CATARACT EXTRACTION PHACO AND INTRAOCULAR LENS PLACEMENT RIGHT EYE;  Surgeon: [name], MD;  Location: [facility];  Service: Ophthalmology;   Laterality: Right;  CDE: 10.15  . COLONOSCOPY  06/04/2002   Normal colon and rectum  . COLONOSCOPY  07/08/2012   Procedure: COLONOSCOPY;  Surgeon: [name], MD;  Location: [facility];  Service: Endoscopy;  Laterality: N/A;  11:30  . COLONOSCOPY N/A 10/02/2017   Procedure: COLONOSCOPY;  Surgeon: [name], MD;  Location: [facility];  Service: Endoscopy;  Laterality: N/A;  10:30am  . CORONARY ANGIOPLASTY WITH STENT PLACEMENT  02/01/2016  . CORONARY ARTERY BYPASS GRAFT  2003   LIMA to LAD,LIMA to distal RCA,SVG to ramus intermediate vessel.  . FOREIGN BODY REMOVAL Right 04/02/2014   Procedure: FOREIGN BODY REMOVAL RIGHT FOOT;  Surgeon: [name], MD;  Location: [facility];  Service: General;  Laterality: Right;  . JOINT REPLACEMENT    . JOINT REPLACEMENT    . LUMBAR DISC SURGERY    . REVISION TOTAL HIP ARTHROPLASTY Bilateral 2005-2012   right-left  . SHOULDER OPEN ROTATOR CUFF REPAIR Right   . TOTAL HIP ARTHROPLASTY Right 1999  . TOTAL HIP ARTHROPLASTY Left 2008  . 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    No current facility-administered medications for this encounter.    Current Outpatient Medications  Medication Sig Dispense Refill Last Dose  . aspirin EC 81 MG tablet Take 1 tablet (81 mg total) by mouth daily. 90 tablet 3   . atorvastatin (LIPITOR) 80 MG tablet Take 1 tablet (80 mg total) by mouth daily for 30 days. 90 tablet 1   . diltiazem (DILACOR XR) 240 MG 24 hr capsule Take 1 capsule (240 mg total) by mouth daily. 90 capsule 3   . furosemide (LASIX) 40 MG tablet Take one daily 90 tablet 1   . isosorbide mononitrate (IMDUR) 30 MG 24 hr tablet TAKE 1 TABLET BY MOUTH EVERY DAY (Patient taking differently: Take 30 mg by mouth daily. ) 90 tablet 3   . metoprolol succinate  (TOPROL-XL) 50 MG 24 hr tablet TAKE 1 & 1/2 TABLETS BY MOUTH EVERY DAY (Patient taking differently: Take 75 mg by mouth daily. ) 135 tablet 3   . nitroGLYCERIN (NITROSTAT) 0.4 MG SL tablet DISSOLVE 1 TABLET UNDER THE TONGUE EVERY 5 MINUTES UP TO 15 MINUTES FOR CHEST PAIN (Patient taking differently: Place 0.4 mg under the tongue every 5 (five) minutes as needed for chest pain. DISSOLVE 1 TABLET UNDER THE TONGUE EVERY 5 MINUTES UP TO 15 MINUTES FOR CHEST PAIN) 25 tablet 0   . potassium chloride (K-DUR) 10 MEQ tablet Take one every other day 45 tablet 1   . ramipril (ALTACE) 2.5 MG capsule TAKE ONE CAPSULE BY MOUTH EVERY DAY 90 capsule 2   . warfarin (COUMADIN) 7.5 MG tablet Take 1/2 to 1 tablet daily as directed by coumadin clinic (Patient taking differently: Take 3.25-7.5 mg by mouth See admin instructions. Take 1/2 to 1 tablet daily as directed by coumadin clinic; Mon and Fri 3.25 mg daily, and all other days is 7.5 mg) 90 tablet 1    Allergies  Allergen Reactions  . Augmentin [Amoxicillin-Pot Clavulanate] Nausea And Vomiting and Other (See Comments)  Has patient had a PCN reaction causing immediate rash, facial/tongue/throat swelling, SOB or lightheadedness with hypotension: Unknown Has patient had a PCN reaction causing severe rash involving mucus membranes or skin necrosis: No Has patient had a PCN reaction that required hospitalization: No Has patient had a PCN reaction occurring within the last 10 years: No If all of the above answers are "NO", then may proceed with Cephalosporin use.     Social History   Tobacco Use  . Smoking status: Former Smoker    Packs/day: 0.50    Years: 2.00    Pack years: 1.00    Types: Cigarettes    Quit date: 09/03/1966    Years since quitting: 52.5  . Smokeless tobacco: Never Used  Substance Use Topics  . Alcohol use: Yes    Alcohol/week: 11.0 standard drinks    Types: 7 Glasses of wine, 4 Cans of beer per week    Comment: a glass of wine with dinner/  beer on the weekend    Family History  Problem Relation Age of Onset  . Heart attack Brother   . Colon cancer Neg Hx       Review of Systems  Constitutional: Negative for chills and fever.  Respiratory: Negative for cough and shortness of breath.   Cardiovascular: Negative for chest pain and palpitations.  Gastrointestinal: Negative for nausea and vomiting.  Musculoskeletal: Positive for joint pain.  Neurological: Negative for tingling.   Objective:  Physical Exam Patient is a 78 year old male. Well nourished and well developed. General: Alert and oriented x3, cooperative and pleasant, no acute distress. Head: normocephalic, atraumatic, neck supple. Eyes: EOMI. Respiratory: breath sounds clear in all fields, no wheezing, rales, or rhonchi. Cardiovascular: Regular rate and rhythm, no murmurs, gallops or rubs. Abdomen: non-tender to palpation and soft, normoactive bowel sounds. Musculoskeletal: Right hip exam: No swelling. Normal range of motion with pain on rotation. Hip rotation causes pain to radiate down to his right knee. There is slight tenderness over the greater trochanter.  Calves soft and nontender. Motor function intact in LE. Strength 5/5 LE bilaterally. Neuro: Distal pulses 2+. Sensation to light touch intact in LE.  Vital signs in last 24 hours:  BP: 116/68 mmHg Pulse: 80 bpm   Labs:   Estimated body mass index is 32.08 kg/m as calculated from the following:   Height as of 02/23/19: 5\' 11"  (1.803 m).   Weight as of 02/23/19: 104.3 kg.  Imaging Review:   AP pelvis, AP and lateral of the right hip dated 11/21/2018 demonstrate the prosthesis remaining in good position with no definitive abnormalities.  Bone Scan: He has slightly increased uptake at the tip of the stem in phase 3 and has increased uptake proximally. This is not consistent with a fracture and does not show definitive loosening  Assessment/Plan:  End stage arthritis, right hip(s) with  failed previous arthroplasty.  The patient history, physical examination, clinical judgement of the provider and imaging studies are consistent with end stage degenerative joint disease of the right hip(s), previous total hip arthroplasty. Revision total hip arthroplasty is deemed medically necessary. The treatment options including medical management, injection therapy, arthroscopy and arthroplasty were discussed at length. The risks and benefits of total hip arthroplasty were presented and reviewed. The risks due to aseptic loosening, infection, stiffness, dislocation/subluxation,  thromboembolic complications and other imponderables were discussed.  The patient acknowledged the explanation, agreed to proceed with the plan and consent was signed. Patient is being admitted for inpatient treatment for surgery,  pain control, PT, OT, prophylactic antibiotics, VTE prophylaxis, progressive ambulation and ADL's and discharge planning. The patient is planning to be discharged home.   Therapy Plans: HEP Disposition: Home with wife Planned DVT Prophylaxis: Warfarin (takes for atrial fibrillation) DME needed: None PCP: Baltazar Apo, MD Cardiologist: [name], MD TXA: IV Allergies: NKDA Anesthesia Concerns: None BMI: 32.1 Other: No lovenox bridge prior to surgery per cardiologist  - Patient was instructed on what medications to stop prior to surgery. - Follow-up visit in 2 weeks with Dr. Wynelle Link - Begin physical therapy following surgery - Pre-operative lab work as pre-surgical testing - Prescriptions will be provided in hospital at time of discharge  Griffith Citron, PA-C Orthopedic Surgery [facility] [place]

## 2019-03-09 NOTE — H&P (Signed)
TOTAL HIP REVISION ADMISSION H&P  Patient is admitted for right revision total hip arthroplasty.  Subjective:  Chief Complaint: right hip pain  HPI: Leonard Cisneros, 78 y.o. 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 The indications for the revision total hip arthroplasty are loosening of one or more components.  He has significant weight bearing thigh pain which has persisted for approximately 8 months and is not getting better despite limited weight bearing. The bone scan does not show evidence of fracture, but revealed some increased activity at the tip of the stem and proximally. Given his overall clinical picture, this suggests loosening of the stem. The patient currently has a diagnosis of right total hip arthroplasty loosening and has failed conservative treatments including activity modification and use of assistive devices. The patient denies an active infection.   Patient Active Problem List   Diagnosis Date Noted  . Chest pain 10/22/2018  . Physical deconditioning 04/26/2018  . Gait instability 04/26/2018  . Closed fracture of greater trochanter of right femur with routine healing 04/20/18 04/25/2018  . Leg pain 04/24/2018  . Fall 04/21/2018  . PAD (peripheral artery disease) ([place]) 04/21/2018  . Hx of adenomatous colonic polyps 08/20/2017  . Ischemic cardiomyopathy 02/02/2016  . Obesity 02/02/2016  . CAD S/P percutaneous coronary angioplasty 02/01/2016  . Exertional angina ([place]) 01/28/2016  . Hyperlipidemia LDL goal <70 01/28/2016  . Neuropathic pain 06/13/2015  . Mild obesity 04/20/2015  . Essential hypertension 04/20/2015  . Unspecified hereditary and idiopathic peripheral neuropathy 04/04/2014  . Foreign body (FB) in soft tissue 03/31/2014  . Impaired glucose metabolism 03/31/2014   . Obesity (BMI 30-39.9) 03/31/2014  . Hx of CABG 01/04/2014  . Rotator cuff tear, left 10/22/2013  . Rotator cuff syndrome of left shoulder 10/22/2013  . Chronic atrial fibrillation 06/18/2013  . Long term (current) use of anticoagulants 06/18/2013  . Screening for colon cancer 06/18/2012  . High risk medication use 06/18/2012   Past Medical History:  Diagnosis Date  . Atrial flutter ([place])    a. s/p remote ablation by Dr. [name].  [name] CAD (coronary artery disease)    a. s/p CABG 2003. b. 01/2016: unstable angina - s/p BMS to SVG-ramus intermediate, patent LIMA-mLAD, patent RIMA-dRCA.   [name] CHF (congestive heart failure) ([place])   . Chronic atrial fibrillation    a. initially post op CABG then chronic. On Coumadin.  . Chronic lower back pain   . Essential hypertension   . High cholesterol   . Ischemic cardiomyopathy    a. Cath 01/2016 -  EF 50-55% with mild distal inferior hypocontractility.  [name] Unspecified hereditary and idiopathic peripheral neuropathy 04/04/2014    Past Surgical History:  Procedure Laterality Date  . BACK SURGERY    . CARDIAC CATHETERIZATION  ~ 2000; 2003  . CARDIAC CATHETERIZATION N/A 02/01/2016   Procedure: Left Heart Cath and Cors/Grafts Angiography;  Surgeon: [name], MD;  Location: [facility];  Service: Cardiovascular;  Laterality: N/A;  . CARDIAC CATHETERIZATION N/A 02/01/2016   Procedure: Coronary Stent Intervention;  Surgeon: [name], MD;  Location: [facility];  Service: Cardiovascular;  Laterality: N/A;  . CATARACT EXTRACTION W/PHACO Left 02/09/2019   Procedure: CATARACT EXTRACTION PHACO AND INTRAOCULAR LENS PLACEMENT (IOC);  Surgeon: [name], MD;  Location: [facility];  Service:  Ophthalmology;  Laterality: Left;  CDE: 27.70  . CATARACT EXTRACTION W/PHACO Right 02/23/2019   Procedure: CATARACT EXTRACTION PHACO AND INTRAOCULAR LENS PLACEMENT RIGHT EYE;  Surgeon: [name], MD;  Location: [facility];  Service: Ophthalmology;   Laterality: Right;  CDE: 10.15  . COLONOSCOPY  06/04/2002   Normal colon and rectum  . COLONOSCOPY  07/08/2012   Procedure: COLONOSCOPY;  Surgeon: [name], MD;  [facility];  Service: Endoscopy;  Laterality: N/A;  11:30  . COLONOSCOPY N/A 10/02/2017   Procedure: COLONOSCOPY;  Surgeon: [name], MD;  [facility];  Service: Endoscopy;  Laterality: N/A;  10:30am  . CORONARY ANGIOPLASTY WITH STENT PLACEMENT  02/01/2016  . CORONARY ARTERY BYPASS GRAFT  2003   LIMA to LAD,LIMA to distal RCA,SVG to ramus intermediate vessel.  . FOREIGN BODY REMOVAL Right 04/02/2014   Procedure: FOREIGN BODY REMOVAL RIGHT FOOT;  Surgeon: [name], MD;  Location: [facility];  Service: General;  Laterality: Right;  . JOINT REPLACEMENT    . JOINT REPLACEMENT    . LUMBAR DISC SURGERY    . REVISION TOTAL HIP ARTHROPLASTY Bilateral 2005-2012   right-left  . SHOULDER OPEN ROTATOR CUFF REPAIR Right   . TOTAL HIP ARTHROPLASTY Right 1999  . TOTAL HIP ARTHROPLASTY Left 2008  . 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    No current facility-administered medications for this encounter.    Current Outpatient Medications  Medication Sig Dispense Refill Last Dose  . aspirin EC 81 MG tablet Take 1 tablet (81 mg total) by mouth daily. 90 tablet 3   . atorvastatin (LIPITOR) 80 MG tablet Take 1 tablet (80 mg total) by mouth daily for 30 days. 90 tablet 1   . diltiazem (DILACOR XR) 240 MG 24 hr capsule Take 1 capsule (240 mg total) by mouth daily. 90 capsule 3   . furosemide (LASIX) 40 MG tablet Take one daily 90 tablet 1   . isosorbide mononitrate (IMDUR) 30 MG 24 hr tablet TAKE 1 TABLET BY MOUTH EVERY DAY (Patient taking differently: Take 30 mg by mouth daily. ) 90 tablet 3   . metoprolol succinate  (TOPROL-XL) 50 MG 24 hr tablet TAKE 1 & 1/2 TABLETS BY MOUTH EVERY DAY (Patient taking differently: Take 75 mg by mouth daily. ) 135 tablet 3   . nitroGLYCERIN (NITROSTAT) 0.4 MG SL tablet DISSOLVE 1 TABLET UNDER THE TONGUE EVERY 5 MINUTES UP TO 15 MINUTES FOR CHEST PAIN (Patient taking differently: Place 0.4 mg under the tongue every 5 (five) minutes as needed for chest pain. DISSOLVE 1 TABLET UNDER THE TONGUE EVERY 5 MINUTES UP TO 15 MINUTES FOR CHEST PAIN) 25 tablet 0   . potassium chloride (K-DUR) 10 MEQ tablet Take one every other day 45 tablet 1   . ramipril (ALTACE) 2.5 MG capsule TAKE ONE CAPSULE BY MOUTH EVERY DAY 90 capsule 2   . warfarin (COUMADIN) 7.5 MG tablet Take 1/2 to 1 tablet daily as directed by coumadin clinic (Patient taking differently: Take 3.25-7.5 mg by mouth See admin instructions. Take 1/2 to 1 tablet daily as directed by coumadin clinic; Mon and Fri 3.25 mg daily, and all other days is 7.5 mg) 90 tablet 1    Allergies  Allergen Reactions  . Augmentin [Amoxicillin-Pot Clavulanate] Nausea And Vomiting and Other (See Comments)  Has patient had a PCN reaction causing immediate rash, facial/tongue/throat swelling, SOB or lightheadedness with hypotension: Unknown Has patient had a PCN reaction causing severe rash involving mucus membranes or skin necrosis: No Has patient had a PCN reaction that required hospitalization: No Has patient had a PCN reaction occurring within the last 10 years: No If all of the above answers are "NO", then may proceed with Cephalosporin use.     Social History   Tobacco Use  . Smoking status: Former Smoker    Packs/day: 0.50    Years: 2.00    Pack years: 1.00    Types: Cigarettes    Quit date: 09/03/1966    Years since quitting: 52.5  . Smokeless tobacco: Never Used  Substance Use Topics  . Alcohol use: Yes    Alcohol/week: 11.0 standard drinks    Types: 7 Glasses of wine, 4 Cans of beer per week    Comment: a glass of wine with dinner/  beer on the weekend    Family History  Problem Relation Age of Onset  . Heart attack Brother   . Colon cancer Neg Hx       Review of Systems  Constitutional: Negative for chills and fever.  Respiratory: Negative for cough and shortness of breath.   Cardiovascular: Negative for chest pain and palpitations.  Gastrointestinal: Negative for nausea and vomiting.  Musculoskeletal: Positive for joint pain.  Neurological: Negative for tingling.   Objective:  Physical Exam Patient is a 78 year old male. Well nourished and well developed. General: Alert and oriented x3, cooperative and pleasant, no acute distress. Head: normocephalic, atraumatic, neck supple. Eyes: EOMI. Respiratory: breath sounds clear in all fields, no wheezing, rales, or rhonchi. Cardiovascular: Regular rate and rhythm, no murmurs, gallops or rubs. Abdomen: non-tender to palpation and soft, normoactive bowel sounds. Musculoskeletal: Right hip exam: No swelling. Normal range of motion with pain on rotation. Hip rotation causes pain to radiate down to his right knee. There is slight tenderness over the greater trochanter.  Calves soft and nontender. Motor function intact in LE. Strength 5/5 LE bilaterally. Neuro: Distal pulses 2+. Sensation to light touch intact in LE.  Vital signs in last 24 hours:  BP: 116/68 mmHg Pulse: 80 bpm   Labs:   Estimated body mass index is 32.08 kg/m as calculated from the following:   Height as of 02/23/19: 5\' 11"  (1.803 m).   Weight as of 02/23/19: 104.3 kg.  Imaging Review:   AP pelvis, AP and lateral of the right hip dated 11/21/2018 demonstrate the prosthesis remaining in good position with no definitive abnormalities.  Bone Scan: He has slightly increased uptake at the tip of the stem in phase 3 and has increased uptake proximally. This is not consistent with a fracture and does not show definitive loosening  Assessment/Plan:  End stage arthritis, right hip(s) with  failed previous arthroplasty.  The patient history, physical examination, clinical judgement of the provider and imaging studies are consistent with end stage degenerative joint disease of the right hip(s), previous total hip arthroplasty. Revision total hip arthroplasty is deemed medically necessary. The treatment options including medical management, injection therapy, arthroscopy and arthroplasty were discussed at length. The risks and benefits of total hip arthroplasty were presented and reviewed. The risks due to aseptic loosening, infection, stiffness, dislocation/subluxation,  thromboembolic complications and other imponderables were discussed.  The patient acknowledged the explanation, agreed to proceed with the plan and consent was signed. Patient is being admitted for inpatient treatment for surgery,  pain control, PT, OT, prophylactic antibiotics, VTE prophylaxis, progressive ambulation and ADL's and discharge planning. The patient is planning to be discharged home.   Therapy Plans: HEP Disposition: Home with wife Planned DVT Prophylaxis: Warfarin (takes for atrial fibrillation) DME needed: None PCP: Baltazar Apo, MD Cardiologist: [name], MD TXA: IV Allergies: NKDA Anesthesia Concerns: None BMI: 32.1 Other: No lovenox bridge prior to surgery per cardiologist  - Patient was instructed on what medications to stop prior to surgery. - Follow-up visit in 2 weeks with Dr. Wynelle Link - Begin physical therapy following surgery - Pre-operative lab work as pre-surgical testing - Prescriptions will be provided in hospital at time of discharge  Griffith Citron, PA-C Orthopedic Surgery [facility] [place]

## 2019-03-12 NOTE — Patient Instructions (Addendum)
YOU HAD   A COVID 19 TEST ON 03-14-2019 . ONCE YOUR COVID TEST IS COMPLETED, PLEASE BEGIN THE QUARANTINE INSTRUCTIONS AS OUTLINED IN YOUR HANDOUT.                Leonard Cisneros    Your procedure is scheduled on: 03-18-2019   Report to [facility] Main  Entrance  Report to admitting at 1110 AM      Call this number if you have problems the morning of surgery [phone]     Remember: [place], NO [place].   NO SOLID FOOD AFTER MIDNIGHT THE NIGHT PRIOR TO SURGERY.  NOTHING BY MOUTH EXCEPT CLEAR LIQUIDS UNTIL 1040 AM.  PLEASE FINISH ENSURE DRINK PER SURGEON ORDER 3 HOURS PRIOR TO SCHEDULED SURGERY TIME WHICH NEEDS TO BE COMPLETED AT 1040 AM.   CLEAR LIQUID DIET   Foods Allowed                                                                     Foods Excluded  Coffee and tea, regular and decaf                             liquids that you cannot  Plain Jell-O in any flavor                                             see through such as: Fruit ices (not with fruit pulp)                                     milk, soups, orange juice  Iced Popsicles                                    All solid food Carbonated beverages, regular and diet                                    Cranberry, grape and apple juices Sports drinks like Gatorade Lightly seasoned clear broth or consume(fat free) Sugar, honey syrup  Sample Menu Breakfast                                Lunch                                     Supper Cranberry juice                    Beef broth                            Chicken broth Jell-O  Grape juice                           Apple juice Coffee or tea                        Jell-O                                      Popsicle                                                Coffee or tea                        Coffee or  tea  _____________________________________________________________________     Take these medicines the morning of surgery with A SIP OF WATER: Isosorbide mononitrate, Toprol-XL, Diltiazam                                You may not have any metal on your body including piercings             Do not wear jewelry, lotions, powders or , deodorant                         Men may shave face and neck.   Do not bring valuables to the hospital. [place].  Contacts, dentures or bridgework may not be worn into surgery.      _____________________________________________________________________             [facility] - Preparing for Surgery Before surgery, you can play an important role.   Because skin is not sterile, your skin needs to be as free of germs as possible.   You can reduce the number of germs on your skin by washing with CHG (chlorahexidine gluconate) soap before surgery.   CHG is an antiseptic cleaner which kills germs and bonds with the skin to continue killing germs even after washing. Please DO NOT use if you have an allergy to CHG or antibacterial soaps .  If your skin becomes reddened/irritated stop using the CHG and inform your nurse when you arrive at Short Stay.   You may shave your face/neck. Please follow these instructions carefully:  1.  Shower with CHG Soap the night before surgery and the  morning of Surgery.  2.  If you choose to wash your hair, wash your hair first as usual with your  normal  shampoo.  3.  After you shampoo, rinse your hair and body thoroughly to remove the  shampoo.                                        4.  Use CHG as you would any other liquid soap.  You can apply chg directly  to the skin and wash  Gently with a scrungie or clean washcloth.  5.  Apply the CHG Soap to your body ONLY FROM THE NECK DOWN.   Do not use on face/ open                           Wound or open  sores. Avoid contact with eyes, ears mouth and genitals (private parts).                       Wash face,  Genitals (private parts) with your normal soap.             6.  Wash thoroughly, paying special attention to the area where your surgery  will be performed.  7.  Thoroughly rinse your body with warm water from the neck down.  8.  DO NOT shower/wash with your normal soap after using and rinsing off  the CHG Soap.                9.  Pat yourself dry with a clean towel.            10.  Wear clean pajamas.            11.  Place clean sheets on your bed the night of your first shower and do not  sleep with pets.  Day of Surgery : Do not apply any lotions/deodorants the morning of surgery.  Please wear clean clothes to the hospital/surgery center.   FAILURE TO FOLLOW THESE INSTRUCTIONS MAY RESULT IN THE CANCELLATION OF YOUR SURGERY PATIENT SIGNATURE_________________________________  NURSE SIGNATURE__________________________________  ________________________________________________________________________   Adam Phenix  An incentive spirometer is a tool that can help keep your lungs clear and active. This tool measures how well you are filling your lungs with each breath. Taking long deep breaths may help reverse or decrease the chance of developing breathing (pulmonary) problems (especially infection) following:  A long period of time when you are unable to move or be active. BEFORE THE PROCEDURE   If the spirometer includes an indicator to show your best effort, your nurse or respiratory therapist will set it to a desired goal.  If possible, sit up straight or lean slightly forward. Try not to slouch.  Hold the incentive spirometer in an upright position. INSTRUCTIONS FOR USE  1. Sit on the edge of your bed if possible, or sit up as far as you can in bed or on a chair. 2. Hold the incentive spirometer in an upright position. 3. Breathe out normally. 4. Place the  mouthpiece in your mouth and seal your lips tightly around it. 5. Breathe in slowly and as deeply as possible, raising the piston or the ball toward the top of the column. 6. Hold your breath for 3-5 seconds or for as long as possible. Allow the piston or ball to fall to the bottom of the column. 7. Remove the mouthpiece from your mouth and breathe out normally. 8. Rest for a few seconds and repeat Steps 1 through 7 at least 10 times every 1-2 hours when you are awake. Take your time and take a few normal breaths between deep breaths. 9. The spirometer may include an indicator to show your best effort. Use the indicator as a goal to work toward during each repetition. 10. After each set of 10 deep breaths, practice coughing to be sure your lungs are clear. If you have an incision (the cut made at the  time of surgery), support your incision when coughing by placing a pillow or rolled up towels firmly against it. Once you are able to get out of bed, walk around indoors and cough well. You may stop using the incentive spirometer when instructed by your caregiver.  RISKS AND COMPLICATIONS  Take your time so you do not get dizzy or light-headed.  If you are in pain, you may need to take or ask for pain medication before doing incentive spirometry. It is harder to take a deep breath if you are having pain. AFTER USE  Rest and breathe slowly and easily.  It can be helpful to keep track of a log of your progress. Your caregiver can provide you with a simple table to help with this. If you are using the spirometer at home, follow these instructions: [place] IF:   You are having difficultly using the spirometer.  You have trouble using the spirometer as often as instructed.  Your pain medication is not giving enough relief while using the spirometer.  You develop fever of 100.5 F (38.1 C) or higher. SEEK IMMEDIATE MEDICAL CARE IF:   You cough up bloody sputum that had not been present  before.  You develop fever of 102 F (38.9 C) or greater.  You develop worsening pain at or near the incision site. MAKE SURE YOU:   Understand these instructions.  Will watch your condition.  Will get help right away if you are not doing well or get worse. Document Released: 12/31/2006 Document Revised: 11/12/2011 Document Reviewed: 03/03/2007 ExitCare Patient Information 2014 Exit[employer], [place].   ________________________________________________________________________  WHAT IS A BLOOD TRANSFUSION? Blood Transfusion Information  A transfusion is the replacement of blood or some of its parts. Blood is made up of multiple cells which provide different functions.  Red blood cells carry oxygen and are used for blood loss replacement.  White blood cells fight against infection.  Platelets control bleeding.  Plasma helps clot blood.  Other blood products are available for specialized needs, such as hemophilia or other clotting disorders. BEFORE THE TRANSFUSION  Who gives blood for transfusions?   Healthy volunteers who are fully evaluated to make sure their blood is safe. This is blood bank blood. Transfusion therapy is the safest it has ever been in the practice of medicine. Before blood is taken from a donor, a complete history is taken to make sure that person has no history of diseases nor engages in risky social behavior (examples are intravenous drug use or sexual activity with multiple partners). The donor's travel history is screened to minimize risk of transmitting infections, such as malaria. The donated blood is tested for signs of infectious diseases, such as HIV and hepatitis. The blood is then tested to be sure it is compatible with you in order to minimize the chance of a transfusion reaction. If you or a relative donates blood, this is often done in anticipation of surgery and is not appropriate for emergency situations. It takes many days to process the donated  blood. RISKS AND COMPLICATIONS Although transfusion therapy is very safe and saves many lives, the main dangers of transfusion include:   Getting an infectious disease.  Developing a transfusion reaction. This is an allergic reaction to something in the blood you were given. Every precaution is taken to prevent this. The decision to have a blood transfusion has been considered carefully by your caregiver before blood is given. Blood is not given unless the benefits outweigh the risks.  AFTER THE TRANSFUSION  Right after receiving a blood transfusion, you will usually feel much better and more energetic. This is especially true if your red blood cells have gotten low (anemic). The transfusion raises the level of the red blood cells which carry oxygen, and this usually causes an energy increase.  The nurse administering the transfusion will monitor you carefully for complications. HOME CARE INSTRUCTIONS  No special instructions are needed after a transfusion. You may find your energy is better. Speak with your caregiver about any limitations on activity for underlying diseases you may have. SEEK MEDICAL CARE IF:   Your condition is not improving after your transfusion.  You develop redness or irritation at the intravenous (IV) site. SEEK IMMEDIATE MEDICAL CARE IF:  Any of the following symptoms occur over the next 12 hours:  Shaking chills.  You have a temperature by mouth above 102 F (38.9 C), not controlled by medicine.  Chest, back, or muscle pain.  People around you feel you are not acting correctly or are confused.  Shortness of breath or difficulty breathing.  Dizziness and fainting.  You get a rash or develop hives.  You have a decrease in urine output.  Your urine turns a dark color or changes to pink, red, or brown. Any of the following symptoms occur over the next 10 days:  You have a temperature by mouth above 102 F (38.9 C), not controlled by  medicine.  Shortness of breath.  Weakness after normal activity.  The white part of the eye turns yellow (jaundice).  You have a decrease in the amount of urine or are urinating less often.  Your urine turns a dark color or changes to pink, red, or brown. Document Released: 08/17/2000 Document Revised: 11/12/2011 Document Reviewed: 04/05/2008 [facility] Patient Information 2014 [place], [place].  _______________________________________________________________________

## 2019-03-12 NOTE — Progress Notes (Signed)
CARDIAC CLEARANCE NOTE ANGELA DUKE PA 01-28-19 Epic EKG 10-23-18 Epic STRESS TEST 10-23-18 Epic ECHO 10-23-18 Epic CHEST 1 VIEW XRAY 10-22-18 Epic NOTE TO HOLD WARFARIN X Winter [facility] 01-28-19 EPIC

## 2019-03-14 ENCOUNTER — Other Ambulatory Visit (HOSPITAL_COMMUNITY)
Admission: RE | Admit: 2019-03-14 | Discharge: 2019-03-14 | Disposition: A | Discharge status: Home / Self Care | Payer: Medicare HMO | Source: Ambulatory Visit | Attending: Orthopedic Surgery | Admitting: Orthopedic Surgery

## 2019-03-14 DIAGNOSIS — Z01812 Encounter for preprocedural laboratory examination: Secondary | ICD-10-CM | POA: Diagnosis not present

## 2019-03-14 DIAGNOSIS — Z1159 Encounter for screening for other viral diseases: Secondary | ICD-10-CM | POA: Insufficient documentation

## 2019-03-14 LAB — SARS CORONAVIRUS 2 (TAT 6-24 HRS): SARS Coronavirus 2: NEGATIVE

## 2019-03-16 ENCOUNTER — Encounter (HOSPITAL_COMMUNITY): Payer: Self-pay

## 2019-03-16 ENCOUNTER — Encounter (HOSPITAL_COMMUNITY)
Admission: RE | Admit: 2019-03-16 | Discharge: 2019-03-16 | Disposition: A | Discharge status: Home / Self Care | Payer: Medicare HMO | Source: Ambulatory Visit | Attending: Orthopedic Surgery | Admitting: Orthopedic Surgery

## 2019-03-16 ENCOUNTER — Other Ambulatory Visit: Payer: Self-pay

## 2019-03-16 DIAGNOSIS — I255 Ischemic cardiomyopathy: Secondary | ICD-10-CM | POA: Diagnosis not present

## 2019-03-16 DIAGNOSIS — Z87891 Personal history of nicotine dependence: Secondary | ICD-10-CM | POA: Diagnosis not present

## 2019-03-16 DIAGNOSIS — M1611 Unilateral primary osteoarthritis, right hip: Secondary | ICD-10-CM | POA: Diagnosis not present

## 2019-03-16 DIAGNOSIS — Z7901 Long term (current) use of anticoagulants: Secondary | ICD-10-CM | POA: Diagnosis not present

## 2019-03-16 DIAGNOSIS — Z96641 Presence of right artificial hip joint: Secondary | ICD-10-CM | POA: Diagnosis not present

## 2019-03-16 DIAGNOSIS — I739 Peripheral vascular disease, unspecified: Secondary | ICD-10-CM | POA: Diagnosis not present

## 2019-03-16 DIAGNOSIS — I482 Chronic atrial fibrillation, unspecified: Secondary | ICD-10-CM | POA: Diagnosis not present

## 2019-03-16 DIAGNOSIS — S80829A Blister (nonthermal), unspecified lower leg, initial encounter: Secondary | ICD-10-CM | POA: Diagnosis not present

## 2019-03-16 DIAGNOSIS — Z538 Procedure and treatment not carried out for other reasons: Secondary | ICD-10-CM | POA: Diagnosis not present

## 2019-03-16 DIAGNOSIS — I2510 Atherosclerotic heart disease of native coronary artery without angina pectoris: Secondary | ICD-10-CM | POA: Diagnosis not present

## 2019-03-16 DIAGNOSIS — Z79899 Other long term (current) drug therapy: Secondary | ICD-10-CM | POA: Diagnosis not present

## 2019-03-16 DIAGNOSIS — E785 Hyperlipidemia, unspecified: Secondary | ICD-10-CM | POA: Diagnosis not present

## 2019-03-16 DIAGNOSIS — I110 Hypertensive heart disease with heart failure: Secondary | ICD-10-CM | POA: Diagnosis not present

## 2019-03-16 DIAGNOSIS — M25551 Pain in right hip: Secondary | ICD-10-CM | POA: Diagnosis present

## 2019-03-16 DIAGNOSIS — Z951 Presence of aortocoronary bypass graft: Secondary | ICD-10-CM | POA: Diagnosis not present

## 2019-03-16 DIAGNOSIS — Z7982 Long term (current) use of aspirin: Secondary | ICD-10-CM | POA: Diagnosis not present

## 2019-03-16 LAB — COMPREHENSIVE METABOLIC PANEL
ALT: 33 U/L (ref 0–44)
AST: 29 U/L (ref 15–41)
Albumin: 4.4 g/dL (ref 3.5–5.0)
Alkaline Phosphatase: 127 U/L — ABNORMAL HIGH (ref 38–126)
Anion gap: 10 (ref 5–15)
BUN: 20 mg/dL (ref 8–23)
CO2: 22 mmol/L (ref 22–32)
Calcium: 9.2 mg/dL (ref 8.9–10.3)
Chloride: 109 mmol/L (ref 98–111)
Creatinine, Ser: 0.96 mg/dL (ref 0.61–1.24)
GFR calc Af Amer: >60 mL/min (ref >60)
GFR calc non Af Amer: >60 mL/min (ref >60)
Glucose, Bld: 117 mg/dL — ABNORMAL HIGH (ref 70–99)
Potassium: 4.6 mmol/L (ref 3.5–5.1)
Sodium: 141 mmol/L (ref 135–145)
Total Bilirubin: 1.2 mg/dL (ref 0.3–1.2)
Total Protein: 7.3 g/dL (ref 6.5–8.1)

## 2019-03-16 LAB — CBC
HCT: 36 % — ABNORMAL LOW (ref 39.0–52.0)
Hemoglobin: 11.8 g/dL — ABNORMAL LOW (ref 13.0–17.0)
MCH: 35.2 pg — ABNORMAL HIGH (ref 26.0–34.0)
MCHC: 32.8 g/dL (ref 30.0–36.0)
MCV: 107.5 fL — ABNORMAL HIGH (ref 80.0–100.0)
Platelets: 203 10*3/uL (ref 150–400)
RBC: 3.35 MIL/uL — ABNORMAL LOW (ref 4.22–5.81)
RDW: 13.8 % (ref 11.5–15.5)
WBC: 8.6 10*3/uL (ref 4.0–10.5)
nRBC: 0 % (ref 0.0–0.2)

## 2019-03-16 LAB — PROTIME-INR
INR: 1.7 — ABNORMAL HIGH (ref 0.8–1.2)
Prothrombin Time: 20 seconds — ABNORMAL HIGH (ref 11.4–15.2)

## 2019-03-16 LAB — APTT: aPTT: 33 seconds (ref 24–36)

## 2019-03-16 LAB — SURGICAL PCR SCREEN
MRSA, PCR: NEGATIVE
Staphylococcus aureus: NEGATIVE

## 2019-03-16 NOTE — Progress Notes (Addendum)
Patient has stopped his warfarin ans ASa 7/10 /20 per Dr. Anne Fu office. At PAT visit pt has two open areas . Posterior and shin of rt leg.  He reports that they are from a trip to the beach 7/4. Labs are in Epic Pt INR is 20.0 and 1.7  Patient called 7/14. Advised to call surgeon's office and let them know about the open areas on his Rt leg. He reports that they happened at the beach. They looked superficial at PAT but surgeon should be aware.

## 2019-03-17 NOTE — Anesthesia Preprocedure Evaluation (Addendum)
Anesthesia Evaluation  Patient identified by MRN, date of birth, ID band Patient awake    Reviewed: Allergy & Precautions, H&P , NPO status , [name] Chart, lab work & pertinent test results, reviewed documented beta blocker date and time   Airway Mallampati: II  TM Distance: >3 FB Neck ROM: Full    Dental  (+) Dental Advisory Given, Partial Upper   Pulmonary neg pulmonary ROS, former smoker,    Pulmonary exam normal breath sounds clear to auscultation       Cardiovascular hypertension, Pt. on medications and Pt. on home beta blockers + angina with exertion + CAD, + Cardiac Stents, + CABG, + Peripheral Vascular Disease and +CHF  + dysrhythmias Atrial Fibrillation  Rhythm:Irregular Rate:Normal  Last EF 55-60% S/P 3v CABG Chronic A. Fib rate controlled  Cardiac Cath 02/01/2016  Ost LAD lesion, 100% stenosed.  Ost Ramus lesion, 100% stenosed.  Prox RCA lesion, 70% stenosed.  Mid RCA lesion, 50% stenosed.  RIMA .  LIMA .  Prox LAD to Mid LAD lesion, 75% stenosed.  SVG .  Origin lesion, 99% stenosed. Post intervention, there is a 0% residual stenosis.  The left ventricular systolic function is normal.   Low normal LV function with a subtle region of distal inferior hypocontractility.  Significant 2 vessel CAD with total occlusion of the LAD and ramus intermediate at its origin; normal left circumflex coronary artery; and RCA with 70% proximal stenosis with diffuse 50% mid stenoses with a " flush and fill" phenomena seen in the distal RCA due to competitive filling the the RIMA graft.  Patent LIMA graft supplying the mid LAD.  Patent RIMA graft supplying the distal RCA.  SVG supplying the ramus intermediate vessel with a 99% near ostial subtotal stenosis with thrombus.  Successful PCI to the SVG with ultimate insertion of a 2.512 mm NirXcell bare metal stent postdilated to 2.64 mm with a 99% stenosis being  reduced to 0% resumption of brisk TIMI-3 flow.    Neuro/Psych Peripheral neuropathy  Neuromuscular disease negative psych ROS   GI/Hepatic negative GI ROS, Neg liver ROS,   Endo/Other  negative endocrine ROS  Renal/GU negative Renal ROS  negative genitourinary   Musculoskeletal Mechanical complication of Right THA ( loosening)   Abdominal   Peds  Hematology negative hematology ROS (+) Chronic Anticoagulation   Anesthesia Other Findings   Reproductive/Obstetrics negative OB ROS                          Anesthesia Physical Anesthesia Plan  ASA: III  Anesthesia Plan: Spinal   Post-op Pain Management:    Induction: Intravenous  PONV Risk Score and Plan: 3 and Ondansetron, Dexamethasone and Midazolam  Airway Management Planned: Natural Airway, Nasal Cannula and Simple Face Mask  Additional Equipment:   Intra-op Plan:   Post-operative Plan: Extubation in OR  Informed Consent: I have reviewed the patients History and Physical, chart, labs and discussed the procedure including the risks, benefits and alternatives for the proposed anesthesia with the patient or authorized representative who has indicated his/her understanding and acceptance.     Dental advisory given  Plan Discussed with: CRNA and Surgeon  Anesthesia Plan Comments: (See PAT note 03/26/2019, Konrad Felix, PA-C)     Anesthesia Quick Evaluation

## 2019-03-17 NOTE — Progress Notes (Addendum)
Anesthesia Chart Review:   Case: [id number] Date/Time: 03/18/19 1310   Procedure: Femoral revision right total hip arthroplasty (Right ) - 169min   Anesthesia type: Choice   Pre-op diagnosis: right total hip arthroplasty loosening   Location: [facility] ROOM 10 / [facility]   Surgeon: [name], MD      DISCUSSION:  Pt is a 78 year old male with hx CAD (s/p CABG 2003; BMS to SVG-ramus 2017), CHF, ischemic cardiomyopathy, chronic atrial fibrillation  - Pt has cardiac clearance for surgery at moderate risk.   - Takes coumadin for afib; pt to hold for 5 days; last dose 03/12/19  - PT/INR elevated. Will recheck day of surgery.   - Per presurgical testing RN who saw pt, he has 2 small open sores on RLE from trip to the beach.  He was instructed to notify surgeon's office.    VS: BP 123/72   Pulse 75   Temp 36.5 C   Resp 16   Ht 5\' 11"  (1.803 m)   Wt 110.3 kg   SpO2 97%   BMI 33.92 kg/m    PROVIDERS: - PCP is [name], MD - Cardiologist is [name], MD. Last office visit 12/17/18 with Bernerd Pho, [place]. Cleared for surgery 01/28/19 by Fabian Sharp, PA.    LABS:  - PT 20.  Pt takes coumadin- last dose 03/12/19.  Will recheck day of surgery.   (all labs ordered are listed, but only abnormal results are displayed)  Labs Reviewed  CBC - Abnormal; Notable for the following components:      Result Value   RBC 3.35 (*)    Hemoglobin 11.8 (*)    HCT 36.0 (*)    MCV 107.5 (*)    MCH 35.2 (*)    All other components within normal limits  COMPREHENSIVE METABOLIC PANEL - Abnormal; Notable for the following components:   Glucose, Bld 117 (*)    Alkaline Phosphatase 127 (*)    All other components within normal limits  PROTIME-INR - Abnormal; Notable for the following components:   Prothrombin Time 20.0 (*)    INR 1.7 (*)    All other components within normal limits  SURGICAL PCR SCREEN  APTT  TYPE AND SCREEN     IMAGES:  1 view CXR 10/22/18:  - Status post coronary  artery bypass grafting. Pulmonary vascular congestion without frank edema or consolidation.   EKG 10/23/18: Atrial fibrillation. Nonspecific ST abnormality   CV:  Nuclear stress test 10/23/18:   There was no ST segment deviation noted during stress.  The study is normal. There are no perfusion defects  This is a low risk study.  The left ventricular ejection fraction is normal (55-65%).  Echo 10/23/18:  1. LV has normal systolic function, EF 83-15%. Cavity size normal. Moderately increased LV wall thickness. LV diastology could not be evaluated secondary to atrial fibrillation.  2. RV normal systolic function. Cavity normal. No increase in RV wall thickness.  3. LA severely dilated.  4. RA moderately dilated.  5. The mitral valve is normal in structure. Moderate thickening of the mitral valve leaflet. No evidence of mitral valve stenosis.  6. The tricuspid valve is normal in structure.  7. The aortic valve is tricuspid Moderate thickening of the aortic valve mild stenosis of the aortic valve.  8. The aortic root is normal in size and structure.  9. Pulmonary hypertension is indeterminant, inadequate TR jet. 10. The inferior vena cava was dilated in size  with >50% respiratory variability.  Cardiac cath 02/01/16:   Ost LAD lesion, 100% stenosed.  Ost Ramus lesion, 100% stenosed.  Prox RCA lesion, 70% stenosed.  Mid RCA lesion, 50% stenosed.  RIMA .  LIMA .  Prox LAD to Mid LAD lesion, 75% stenosed.  SVG .  Origin lesion, 99% stenosed. Post intervention, there is a 0% residual stenosis.  The left ventricular systolic function is normal.   Past Medical History:  Diagnosis Date  . Atrial flutter ([place])    a. s/p remote ablation by Dr. [name].  [name] CAD (coronary artery disease)    a. s/p CABG 2003. b. 01/2016: unstable angina - s/p BMS to SVG-ramus intermediate, patent LIMA-mLAD, patent RIMA-dRCA.   [name] CHF (congestive heart failure) ([place]) 11/2010   seen at [facility] then started  lasix  . Chronic atrial fibrillation    a. initially post op CABG then chronic. On Coumadin.  . Chronic lower back pain   . Dysrhythmia 2000   4 or 5 cardioversions.  . Essential hypertension   . High cholesterol   . Ischemic cardiomyopathy    a. Cath 01/2016 -  EF 50-55% with mild distal inferior hypocontractility.  [name] Unspecified hereditary and idiopathic peripheral neuropathy 04/04/2014    Past Surgical History:  Procedure Laterality Date  . BACK SURGERY    . CARDIAC CATHETERIZATION  ~ 2000; 2003  . CARDIAC CATHETERIZATION N/A 02/01/2016   Procedure: Left Heart Cath and Cors/Grafts Angiography;  Surgeon: [name], MD;  Location: [facility];  Service: Cardiovascular;  Laterality: N/A;  . CARDIAC CATHETERIZATION N/A 02/01/2016   Procedure: Coronary Stent Intervention;  Surgeon: [name], MD;  Location: [facility];  Service: Cardiovascular;  Laterality: N/A;  . CATARACT EXTRACTION W/PHACO Left 02/09/2019   Procedure: CATARACT EXTRACTION PHACO AND INTRAOCULAR LENS PLACEMENT (IOC);  Surgeon: [name], MD;  Location: [facility];  Service: Ophthalmology;  Laterality: Left;  CDE: 27.70  . CATARACT EXTRACTION W/PHACO Right 02/23/2019   Procedure: CATARACT EXTRACTION PHACO AND INTRAOCULAR LENS PLACEMENT RIGHT EYE;  Surgeon: [name], MD;  Location: [facility];  Service: Ophthalmology;  Laterality: Right;  CDE: 10.15  . COLONOSCOPY  06/04/2002   Normal colon and rectum  . COLONOSCOPY  07/08/2012   Procedure: COLONOSCOPY;  Surgeon: [name], MD;  [facility];  Service: Endoscopy;  Laterality: N/A;  11:30  . COLONOSCOPY N/A 10/02/2017   Procedure: COLONOSCOPY;  Surgeon: [name], MD;  [facility];  Service: Endoscopy;  Laterality: N/A;  10:30am  . CORONARY ANGIOPLASTY WITH STENT PLACEMENT  02/01/2016  . CORONARY ARTERY BYPASS GRAFT  2003   LIMA to LAD,LIMA to distal RCA,SVG to ramus intermediate vessel.  . FOREIGN BODY REMOVAL Right  04/02/2014   Procedure: FOREIGN BODY REMOVAL RIGHT FOOT;  Surgeon: [name], MD;  Location: [facility];  Service: General;  Laterality: Right;  . JOINT REPLACEMENT    . JOINT REPLACEMENT    . LUMBAR DISC SURGERY    . REVISION TOTAL HIP ARTHROPLASTY Bilateral 2005-2012   right-left  . SHOULDER OPEN ROTATOR CUFF REPAIR Right   . TOTAL HIP ARTHROPLASTY Right 1999  . TOTAL HIP ARTHROPLASTY Left 2008  . 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    MEDICATIONS: . aspirin EC 81 MG tablet  .  atorvastatin (LIPITOR) 80 MG tablet  . diltiazem (DILACOR XR) 240 MG 24 hr capsule  . furosemide (LASIX) 40 MG tablet  . isosorbide mononitrate (IMDUR) 30 MG 24 hr tablet  . metoprolol succinate (TOPROL-XL) 50 MG 24 hr tablet  . nitroGLYCERIN (NITROSTAT) 0.4 MG SL tablet  . potassium chloride (K-DUR) 10 MEQ tablet  . ramipril (ALTACE) 2.5 MG capsule  . warfarin (COUMADIN) 7.5 MG tablet   No current facility-administered medications for this encounter.    - Pt stopped coumadin and ASA 03/13/19  If labs acceptable day of surgery, I anticipate pt can proceed with surgery as scheduled.  Willeen Cass, FNP-BC [facility] Short Stay [facility]/Anesthesiology Phone: [phone] 03/17/2019 10:35 AM

## 2019-03-18 ENCOUNTER — Encounter (HOSPITAL_COMMUNITY): Payer: Self-pay | Admitting: Anesthesiology

## 2019-03-18 ENCOUNTER — Other Ambulatory Visit: Payer: Self-pay

## 2019-03-18 ENCOUNTER — Encounter (HOSPITAL_COMMUNITY)
Admission: RE | Disposition: A | Discharge status: Home / Self Care | Payer: Self-pay | Source: Home / Self Care | Attending: Orthopedic Surgery

## 2019-03-18 ENCOUNTER — Telehealth (HOSPITAL_COMMUNITY): Payer: Self-pay | Admitting: *Deleted

## 2019-03-18 ENCOUNTER — Ambulatory Visit (HOSPITAL_COMMUNITY)
Admission: RE | Admit: 2019-03-18 | Discharge: 2019-03-18 | Disposition: A | Discharge status: Home / Self Care | Payer: Medicare HMO | Attending: Orthopedic Surgery | Admitting: Orthopedic Surgery

## 2019-03-18 DIAGNOSIS — Z96641 Presence of right artificial hip joint: Secondary | ICD-10-CM | POA: Diagnosis not present

## 2019-03-18 DIAGNOSIS — S80829A Blister (nonthermal), unspecified lower leg, initial encounter: Secondary | ICD-10-CM | POA: Insufficient documentation

## 2019-03-18 DIAGNOSIS — Z7901 Long term (current) use of anticoagulants: Secondary | ICD-10-CM | POA: Insufficient documentation

## 2019-03-18 DIAGNOSIS — I255 Ischemic cardiomyopathy: Secondary | ICD-10-CM | POA: Insufficient documentation

## 2019-03-18 DIAGNOSIS — M1611 Unilateral primary osteoarthritis, right hip: Secondary | ICD-10-CM | POA: Diagnosis not present

## 2019-03-18 DIAGNOSIS — I739 Peripheral vascular disease, unspecified: Secondary | ICD-10-CM | POA: Insufficient documentation

## 2019-03-18 DIAGNOSIS — I2510 Atherosclerotic heart disease of native coronary artery without angina pectoris: Secondary | ICD-10-CM | POA: Diagnosis not present

## 2019-03-18 DIAGNOSIS — Z79899 Other long term (current) drug therapy: Secondary | ICD-10-CM | POA: Insufficient documentation

## 2019-03-18 DIAGNOSIS — Z7982 Long term (current) use of aspirin: Secondary | ICD-10-CM | POA: Insufficient documentation

## 2019-03-18 DIAGNOSIS — Z951 Presence of aortocoronary bypass graft: Secondary | ICD-10-CM | POA: Insufficient documentation

## 2019-03-18 DIAGNOSIS — Z538 Procedure and treatment not carried out for other reasons: Secondary | ICD-10-CM | POA: Diagnosis not present

## 2019-03-18 DIAGNOSIS — I482 Chronic atrial fibrillation, unspecified: Secondary | ICD-10-CM | POA: Insufficient documentation

## 2019-03-18 DIAGNOSIS — E785 Hyperlipidemia, unspecified: Secondary | ICD-10-CM | POA: Insufficient documentation

## 2019-03-18 DIAGNOSIS — Z87891 Personal history of nicotine dependence: Secondary | ICD-10-CM | POA: Insufficient documentation

## 2019-03-18 DIAGNOSIS — I110 Hypertensive heart disease with heart failure: Secondary | ICD-10-CM | POA: Diagnosis not present

## 2019-03-18 LAB — TYPE AND SCREEN
ABO/RH(D): O POS
Antibody Screen: NEGATIVE

## 2019-03-18 LAB — PROTIME-INR
INR: 1.2 (ref 0.8–1.2)
Prothrombin Time: 15.1 seconds (ref 11.4–15.2)

## 2019-03-18 SURGERY — FEMORAL REVISION
Anesthesia: General | Laterality: Right

## 2019-03-18 MED ORDER — FENTANYL CITRATE (PF) 100 MCG/2ML IJ SOLN
INTRAMUSCULAR | Status: AC
  Filled 2019-03-18: qty 2

## 2019-03-18 MED ORDER — CEFAZOLIN SODIUM-DEXTROSE 2-4 GM/100ML-% IV SOLN
2.0000 g | INTRAVENOUS | Status: DC
  Filled 2019-03-18: qty 100

## 2019-03-18 MED ORDER — POVIDONE-IODINE 10 % EX SWAB
2.0000 "application " | Freq: Once | CUTANEOUS | Status: AC
  Administered 2019-03-18: 2 via TOPICAL

## 2019-03-18 MED ORDER — ACETAMINOPHEN 10 MG/ML IV SOLN
1000.0000 mg | Freq: Four times a day (QID) | INTRAVENOUS | Status: DC
  Filled 2019-03-18: qty 100

## 2019-03-18 MED ORDER — CHLORHEXIDINE GLUCONATE 4 % EX LIQD: 60.0000 mL | Freq: Once | CUTANEOUS | Status: DC

## 2019-03-18 MED ORDER — LACTATED RINGERS IV SOLN
INTRAVENOUS | Status: DC
  Administered 2019-03-18: 12:00:00 via INTRAVENOUS

## 2019-03-18 MED ORDER — TRANEXAMIC ACID-NACL 1000-0.7 MG/100ML-% IV SOLN
1000.0000 mg | INTRAVENOUS | Status: DC
  Filled 2019-03-18: qty 100

## 2019-03-18 MED ORDER — DEXAMETHASONE SODIUM PHOSPHATE 10 MG/ML IJ SOLN: 8.0000 mg | Freq: Once | INTRAMUSCULAR | Status: DC

## 2019-03-18 MED ORDER — LACTATED RINGERS IV SOLN: INTRAVENOUS | Status: DC

## 2019-03-18 NOTE — Discharge Instructions (Signed)
Dr. [name] Total Joint Specialist [facility] [address] [place], [place] [address] [phone]  POSTERIOR TOTAL HIP REVISION POSTOPERATIVE DIRECTIONS  Hip Rehabilitation, Guidelines Following Surgery  The results of a hip operation are greatly improved after range of motion and muscle strengthening exercises. Follow all safety measures which are given to protect your hip. If any of these exercises cause increased pain or swelling in your joint, decrease the amount until you are comfortable again. Then slowly increase the exercises. Call your caregiver if you have problems or questions.   HOME CARE INSTRUCTIONS   Remove items at home which could result in a fall. This includes throw rugs or furniture in walking pathways.   ICE to the affected hip every three hours for 30 minutes at a time and then as needed for pain and swelling.  Continue to use ice on the hip for pain and swelling from surgery. You may notice swelling that will progress down to the foot and ankle.  This is normal after surgery.  Elevate the leg when you are not up walking on it.    Continue to use the breathing machine which will help keep your temperature down.  It is common for your temperature to cycle up and down following surgery, especially at night when you are not up moving around and exerting yourself.  The breathing machine keeps your lungs expanded and your temperature down.  DIET You may resume your previous home diet once your are discharged from the hospital.  DRESSING / WOUND CARE / SHOWERING You may change your dressing 3-5 days after surgery.  Then change the dressing every day with sterile gauze.  Please use good hand washing techniques before changing the dressing.  Do not use any lotions or creams on the incision until instructed by your surgeon. You may start showering once you are discharged home but do not submerge the incision under water. Just pat the incision dry and  apply a dry gauze dressing on daily. Change the surgical dressing daily and reapply a dry dressing each time.  ACTIVITY Walk with your walker as instructed. Use walker as long as suggested by your caregivers. Avoid periods of inactivity such as sitting longer than an hour when not asleep. This helps prevent blood clots.  You may resume a sexual relationship in one month or when given the OK by your doctor.  You may return to work once you are cleared by your doctor.  Do not drive a car for 6 weeks or until released by you surgeon.  Do not drive while taking narcotics.  WEIGHT BEARING Weight bearing as tolerated with assist device (walker, cane, etc) as directed, use it as long as suggested by your surgeon or therapist, typically at least 4-6 weeks.  POSTOPERATIVE CONSTIPATION PROTOCOL Constipation - defined medically as fewer than three stools per week and severe constipation as less than one stool per week.  One of the most common issues patients have following surgery is constipation.  Even if you have a regular bowel pattern at home, your normal regimen is likely to be disrupted due to multiple reasons following surgery.  Combination of anesthesia, postoperative narcotics, change in appetite and fluid intake all can affect your bowels.  In order to avoid complications following surgery, here are some recommendations in order to help you during your recovery period.  Colace (docusate) - Pick up an over-the-counter form of Colace or another stool softener and take twice a day as  long as you are requiring postoperative pain medications.  Take with a full glass of water daily.  If you experience loose stools or diarrhea, hold the colace until you stool forms back up.  If your symptoms do not get better within 1 week or if they get worse, check with your doctor.  Dulcolax (bisacodyl) - Pick up over-the-counter and take as directed by the product packaging as needed to assist with the movement of  your bowels.  Take with a full glass of water.  Use this product as needed if not relieved by Colace only.   MiraLax (polyethylene glycol) - Pick up over-the-counter to have on hand.  MiraLax is a solution that will increase the amount of water in your bowels to assist with bowel movements.  Take as directed and can mix with a glass of water, juice, soda, coffee, or tea.  Take if you go more than two days without a movement. Do not use MiraLax more than once per day. Call your doctor if you are still constipated or irregular after using this medication for 7 days in a row.  If you continue to have problems with postoperative constipation, please contact the office for further assistance and recommendations.  If you experience "the worst abdominal pain ever" or develop nausea or vomiting, please contact the office immediatly for further recommendations for treatment.  ITCHING  If you experience itching with your medications, try taking only a single pain pill, or even half a pain pill at a time.  You can also use Benadryl over the counter for itching or also to help with sleep.   TED HOSE STOCKINGS Wear the elastic stockings on both legs for three weeks following surgery during the day but you may remove then at night for sleeping.  MEDICATIONS See your medication summary on the After Visit Summary that the nursing staff will review with you prior to discharge.  You may have some home medications which will be placed on hold until you complete the course of blood thinner medication.  It is important for you to complete the blood thinner medication as prescribed by your surgeon.  Continue your approved medications as instructed at time of discharge.  PRECAUTIONS If you experience chest pain or shortness of breath - call 911 immediately for transfer to the hospital emergency department.  If you develop a fever greater that 101 F, purulent drainage from wound, increased redness or drainage from  wound, foul odor from the wound/dressing, or calf pain - CONTACT YOUR SURGEON.                                                   FOLLOW-UP APPOINTMENTS Make sure you keep all of your appointments after your operation with your surgeon and caregivers. You should call the office at the above phone number and make an appointment for approximately two weeks after the date of your surgery or on the date instructed by your surgeon outlined in the "After Visit Summary".  RANGE OF MOTION AND STRENGTHENING EXERCISES  These exercises are designed to help you keep full movement of your hip joint. Follow your caregiver's or physical therapist's instructions. Perform all exercises about fifteen times, three times per day or as directed. Exercise both hips, even if you have had only one joint replacement. These exercises can be done on a  training (exercise) mat, on the floor, on a table or on a bed. Use whatever works the best and is most comfortable for you. Use music or television while you are exercising so that the exercises are a pleasant break in your day. This will make your life better with the exercises acting as a break in routine you can look forward to.   Lying on your back, slowly slide your foot toward your buttocks, raising your knee up off the floor. Then slowly slide your foot back down until your leg is straight again.   Lying on your back spread your legs as far apart as you can without causing discomfort.   Lying on your side, raise your upper leg and foot straight up from the floor as far as is comfortable. Slowly lower the leg and repeat.   Lying on your back, tighten up the muscle in the front of your thigh (quadriceps muscles). You can do this by keeping your leg straight and trying to raise your heel off the floor. This helps strengthen the largest muscle supporting your knee.   Lying on your back, tighten up the muscles of your buttocks both with the legs straight and with the knee bent  at a comfortable angle while keeping your heel on the floor.   IF YOU ARE TRANSFERRED TO A SKILLED REHAB FACILITY If the patient is transferred to a skilled rehab facility following release from the hospital, a list of the current medications will be sent to the facility for the patient to continue.  When discharged from the skilled rehab facility, please have the facility set up the patient's [place] prior to being released. Also, the skilled facility will be responsible for providing the patient with their medications at time of release from the facility to include their pain medication, the muscle relaxants, and their blood thinner medication. If the patient is still at the rehab facility at time of the two week follow up appointment, the skilled rehab facility will also need to assist the patient in arranging follow up appointment in our office and any transportation needs.  MAKE SURE YOU:   Understand these instructions.   Get help right away if you are not doing well or get worse.    Pick up stool softner and laxative for home use following surgery while on pain medications. Do not submerge incision under water. Please use good hand washing techniques while changing dressing each day. May shower starting three days after surgery. Please use a clean towel to pat the incision dry following showers. Continue to use ice for pain and swelling after surgery. Do not use any lotions or creams on the incision until instructed by your surgeon.

## 2019-03-18 NOTE — Transfer of Care (Addendum)
Immediate Anesthesia Transfer of Care Note  Patient: Leonard Cisneros  Procedure(s) Performed: Femoral revision right total hip arthroplasty (Right Hip)  Patient Location: PACU  Anesthesia Type: Spinal  Level of Consciousness: awake, alert  and patient cooperative  Airway and Oxygen Therapy: Patient Spontanous Breathing and Patient connected to supplemental oxygen  Post-op Assessment: Post-op Vital signs reviewed, [name] Cardiovascular Status Stable, Respiratory Function Stable, Patent Airway and No signs of Nausea or vomiting  Post-op Vital Signs: Reviewed and stable  Complications: No apparent anesthesia complications

## 2019-03-25 NOTE — Patient Instructions (Addendum)
YOU NEED TO HAVE A COVID 19 TEST ON 03-26-2019 AT 11:05 AM.  THIS TEST MUST BE DONE BEFORE SURGERY, COME TO [facility] ENTRANCE. ONCE YOUR COVID TEST IS COMPLETED, PLEASE BEGIN THE QUARANTINE INSTRUCTIONS AS OUTLINED IN YOUR HANDOUT.                Leonard Cisneros    Your procedure is scheduled on: 03-30-2019   Report to [facility] Main  Entrance    Report to admitting at 12:35 PM      Call this number if you have problems the morning of surgery [phone]    Remember: Do not eat food after Midnight. CLEAR LIQUIDS FROM MIDNIGHT UNTIL 9:00 AM. NOTHING BY MOUTH AFTER 9:00 AM.   BRUSH YOUR TEETH MORNING OF SURGERY AND RINSE YOUR MOUTH OUT, NO CHEWING GUM CANDY OR MINTS.     CLEAR LIQUID DIET   Foods Allowed                                                                     Foods Excluded  Coffee and tea, regular and decaf                             liquids that you cannot  Plain Jell-O any favor except red or purple                                           see through such as: Fruit ices (not with fruit pulp)                                     milk, soups, orange juice  Iced Popsicles                                    All solid food Carbonated beverages, regular and diet                                    Cranberry, grape and apple juices Sports drinks like Gatorade Lightly seasoned clear broth or consume(fat free) Sugar, honey syrup  Sample Menu Breakfast                                Lunch                                     Supper Cranberry juice                    Beef broth                            Chicken broth Jell-O  Grape juice                           Apple juice Coffee or tea                        Jell-O                                      Popsicle                                                Coffee or tea                        Coffee or  tea  _____________________________________________________________________     Take these medicines the morning of surgery with A SIP OF WATER: DILTIAZEM, METOPROLOL, ISOSORBIDE,                                You may not have any metal on your body including hair pins and              piercings  Do not wear jewelry, make-up, lotions, powders or perfumes, deodorant                       Men may shave face and neck.   Do not bring valuables to the hospital. [place].  Contacts, dentures or bridgework may not be worn into surgery.  Leave suitcase in the car. After surgery it may be brought to your room.      _____________________________________________________________________             [facility] - Preparing for Surgery Before surgery, you can play an important role.  Because skin is not sterile, your skin needs to be as free of germs as possible.  You can reduce the number of germs on your skin by washing with CHG (chlorahexidine gluconate) soap before surgery.  CHG is an antiseptic cleaner which kills germs and bonds with the skin to continue killing germs even after washing. Please DO NOT use if you have an allergy to CHG or antibacterial soaps.  If your skin becomes reddened/irritated stop using the CHG and inform your nurse when you arrive at Short Stay. Do not shave (including legs and underarms) for at least 48 hours prior to the first CHG shower.  You may shave your face/neck. Please follow these instructions carefully:  1.  Shower with CHG Soap the night before surgery and the  morning of Surgery.  2.  If you choose to wash your hair, wash your hair first as usual with your  normal  shampoo.  3.  After you shampoo, rinse your hair and body thoroughly to remove the  shampoo.                           4.  Use CHG as you would any other liquid soap.  You can apply chg directly  to  the skin and wash                       Gently  with a scrungie or clean washcloth.  5.  Apply the CHG Soap to your body ONLY FROM THE NECK DOWN.   Do not use on face/ open                           Wound or open sores. Avoid contact with eyes, ears mouth and genitals (private parts).                       Wash face,  Genitals (private parts) with your normal soap.             6.  Wash thoroughly, paying special attention to the area where your surgery  will be performed.  7.  Thoroughly rinse your body with warm water from the neck down.  8.  DO NOT shower/wash with your normal soap after using and rinsing off  the CHG Soap.                9.  Pat yourself dry with a clean towel.            10.  Wear clean pajamas.            11.  Place clean sheets on your bed the night of your first shower and do not  sleep with pets. Day of Surgery : Do not apply any lotions/deodorants the morning of surgery.  Please wear clean clothes to the hospital/surgery center.  FAILURE TO FOLLOW THESE INSTRUCTIONS MAY RESULT IN THE CANCELLATION OF YOUR SURGERY PATIENT SIGNATURE_________________________________  NURSE SIGNATURE__________________________________  ________________________________________________________________________

## 2019-03-25 NOTE — Progress Notes (Signed)
NEED ORDERS FOR 7-27 SURGERY IN Epic PRE OP IS 7-23- 1000 AM.

## 2019-03-25 NOTE — Progress Notes (Signed)
LOV PCP DR Wolfgang Phoenix 02-02-19 Epic STRESS TEST 10-23-18 Epic NOTE TO STOP WARFARIN 5 DAYS BEFORE SURGERY KRISTIN ALVSTAD 01-28-19 FOR 03-18-19 SURGERY

## 2019-03-26 ENCOUNTER — Other Ambulatory Visit (HOSPITAL_COMMUNITY)
Admission: RE | Admit: 2019-03-26 | Discharge: 2019-03-26 | Disposition: A | Discharge status: Home / Self Care | Payer: Medicare HMO | Source: Ambulatory Visit | Attending: Orthopedic Surgery | Admitting: Orthopedic Surgery

## 2019-03-26 ENCOUNTER — Other Ambulatory Visit: Payer: Self-pay

## 2019-03-26 ENCOUNTER — Encounter (HOSPITAL_COMMUNITY): Payer: Self-pay

## 2019-03-26 ENCOUNTER — Encounter (HOSPITAL_COMMUNITY)
Admission: RE | Admit: 2019-03-26 | Discharge: 2019-03-26 | Disposition: A | Discharge status: Home / Self Care | Payer: Medicare HMO | Source: Ambulatory Visit | Attending: Orthopedic Surgery | Admitting: Orthopedic Surgery

## 2019-03-26 DIAGNOSIS — I482 Chronic atrial fibrillation, unspecified: Secondary | ICD-10-CM | POA: Insufficient documentation

## 2019-03-26 DIAGNOSIS — I509 Heart failure, unspecified: Secondary | ICD-10-CM | POA: Diagnosis not present

## 2019-03-26 DIAGNOSIS — Z951 Presence of aortocoronary bypass graft: Secondary | ICD-10-CM | POA: Diagnosis not present

## 2019-03-26 DIAGNOSIS — I255 Ischemic cardiomyopathy: Secondary | ICD-10-CM | POA: Insufficient documentation

## 2019-03-26 DIAGNOSIS — I4892 Unspecified atrial flutter: Secondary | ICD-10-CM | POA: Insufficient documentation

## 2019-03-26 DIAGNOSIS — I110 Hypertensive heart disease with heart failure: Secondary | ICD-10-CM | POA: Diagnosis not present

## 2019-03-26 DIAGNOSIS — E7800 Pure hypercholesterolemia, unspecified: Secondary | ICD-10-CM | POA: Diagnosis not present

## 2019-03-26 DIAGNOSIS — I272 Pulmonary hypertension, unspecified: Secondary | ICD-10-CM | POA: Diagnosis not present

## 2019-03-26 DIAGNOSIS — G609 Hereditary and idiopathic neuropathy, unspecified: Secondary | ICD-10-CM | POA: Insufficient documentation

## 2019-03-26 DIAGNOSIS — G8929 Other chronic pain: Secondary | ICD-10-CM | POA: Insufficient documentation

## 2019-03-26 DIAGNOSIS — Z79899 Other long term (current) drug therapy: Secondary | ICD-10-CM | POA: Insufficient documentation

## 2019-03-26 DIAGNOSIS — T84038A Mechanical loosening of other internal prosthetic joint, initial encounter: Secondary | ICD-10-CM | POA: Diagnosis not present

## 2019-03-26 DIAGNOSIS — I350 Nonrheumatic aortic (valve) stenosis: Secondary | ICD-10-CM | POA: Insufficient documentation

## 2019-03-26 DIAGNOSIS — Z7982 Long term (current) use of aspirin: Secondary | ICD-10-CM | POA: Diagnosis not present

## 2019-03-26 DIAGNOSIS — Z01812 Encounter for preprocedural laboratory examination: Secondary | ICD-10-CM | POA: Diagnosis present

## 2019-03-26 DIAGNOSIS — Z7901 Long term (current) use of anticoagulants: Secondary | ICD-10-CM | POA: Insufficient documentation

## 2019-03-26 DIAGNOSIS — Z96643 Presence of artificial hip joint, bilateral: Secondary | ICD-10-CM | POA: Insufficient documentation

## 2019-03-26 DIAGNOSIS — I25810 Atherosclerosis of coronary artery bypass graft(s) without angina pectoris: Secondary | ICD-10-CM | POA: Insufficient documentation

## 2019-03-26 LAB — CBC
HCT: 36.6 % — ABNORMAL LOW (ref 39.0–52.0)
Hemoglobin: 11.7 g/dL — ABNORMAL LOW (ref 13.0–17.0)
MCH: 34.3 pg — ABNORMAL HIGH (ref 26.0–34.0)
MCHC: 32 g/dL (ref 30.0–36.0)
MCV: 107.3 fL — ABNORMAL HIGH (ref 80.0–100.0)
Platelets: 227 10*3/uL (ref 150–400)
RBC: 3.41 MIL/uL — ABNORMAL LOW (ref 4.22–5.81)
RDW: 13.9 % (ref 11.5–15.5)
WBC: 9.2 10*3/uL (ref 4.0–10.5)
nRBC: 0 % (ref 0.0–0.2)

## 2019-03-26 LAB — BASIC METABOLIC PANEL
Anion gap: 9 (ref 5–15)
BUN: 18 mg/dL (ref 8–23)
CO2: 23 mmol/L (ref 22–32)
Calcium: 9.3 mg/dL (ref 8.9–10.3)
Chloride: 108 mmol/L (ref 98–111)
Creatinine, Ser: 0.92 mg/dL (ref 0.61–1.24)
GFR calc Af Amer: >60 mL/min (ref >60)
GFR calc non Af Amer: >60 mL/min (ref >60)
Glucose, Bld: 106 mg/dL — ABNORMAL HIGH (ref 70–99)
Potassium: 4.4 mmol/L (ref 3.5–5.1)
Sodium: 140 mmol/L (ref 135–145)

## 2019-03-26 LAB — SURGICAL PCR SCREEN
MRSA, PCR: NEGATIVE
Staphylococcus aureus: NEGATIVE

## 2019-03-26 LAB — SARS CORONAVIRUS 2 (TAT 6-24 HRS): SARS Coronavirus 2: NEGATIVE

## 2019-03-26 NOTE — Progress Notes (Signed)
Pre-op appt progress note:   Patient presents with wounds to lower right leg. Wounds are scattered across shin area with varying sizes. Wounds interior is pale and displays a weeping-like clear discharge. Patient states the wounds appeared after trip to the beach. patient states they are a result of "bad sunburn". He reports that the trip was the weekend before his planned hip surgery on  and on the day of the surgery 03-18-2019 , went as far as being prepped with IV for the surgery and was suddenly cancelled due to the appearance of the wound. Patient reports now he is applying neosporin to the wound and is seeing significant improvment . He Reports Aluisio is aware of the condition of the wound. APP Janett Billow called in to assess wound today . APP to f/u

## 2019-03-26 NOTE — Progress Notes (Signed)
Cardiac clearance, see telephone encounter dated  01-28-2019  Echo 10-23-2018 epic

## 2019-03-27 NOTE — Progress Notes (Signed)
Anesthesia Chart Review   Case: [id number] Date/Time: 03/30/19 1450   Procedure: Femoral revision right total hip arthroplasty (Right ) - 160min   Anesthesia type: General   Pre-op diagnosis: right total hip arthroplasty loosening   Location: [facility] ROOM 10 / [facility]   Surgeon: [name], MD      DISCUSSION:Pt is a 78 year old male with hx CAD (s/p CABG 2003; BMS to SVG-ramus 2017), CHF, ischemic cardiomyopathy, chronic atrial fibrillation  - Pt has cardiac clearance for surgery at moderate risk.   Per Leonard Sharp, PA-C, " Leonard Cisneros was last seen on 12/17/18 by Leonard Cisneros PAC.  Since that day, Leonard Cisneros has done well. He denies any further anginal symptoms. His stress test was encouraging in Feb 2020. He can't complete 4.0 METS given his hip pain. This will be his fifth hip surgery. He understands that he is at higher risk for cardiac complications. Per our pharmacy staff: Pt takes warfarin for afib with CHADS2VASc score of 5 (age x2, CHF, HTN, CAD). Ok to hold warfarin for 5 days prior to procedure. Therefore, based on ACC/AHA guidelines, the patient would be at moderate, but acceptable, risk for the planned procedure without further cardiovascular testing."  - Takes coumadin for afib; pt to hold for 5 days; last dose 03/12/19  - PT/INR elevated. Will recheck day of surgery.   Cancelled 03/17/2019 due to 2 small open sores on RLE from sunburn after a recent beach trip.  Seen today at PAT.  Pt reports wounds are healing.  They appear superficial, but actively draining.  Picture of wound routed to Dr. Wynelle Link for review.   VS: BP 127/70   Pulse 75   Temp 37 C (Oral)   Resp 18   Ht 5\' 11"  (1.803 m)   Wt 108 kg   SpO2 98%   BMI 33.20 kg/m   PROVIDERS: Leonard Kirschner, MD is PCP   Leonard Majestic, MD is Cardiologist LABS: Labs reviewed: Acceptable for surgery. (all labs ordered are listed, but only abnormal results are displayed)  Labs Reviewed  BASIC METABOLIC  PANEL - Abnormal; Notable for the following components:      Result Value   Glucose, Bld 106 (*)    All other components within normal limits  CBC - Abnormal; Notable for the following components:   RBC 3.41 (*)    Hemoglobin 11.7 (*)    HCT 36.6 (*)    MCV 107.3 (*)    MCH 34.3 (*)    All other components within normal limits  SURGICAL PCR SCREEN     IMAGES:   EKG: 10/23/18: Atrial fibrillation. Nonspecific ST abnormality  CV: Nuclear stress test 10/23/18:   There was no ST segment deviation noted during stress.  The study is normal. There are no perfusion defects  This is a low risk study.  The left ventricular ejection fraction is normal (55-65%).  Echo 10/23/18:  1. LV has normal systolic function, EF 98-33%. Cavity size normal. Moderately increased LV wall thickness. LV diastology could not be evaluated secondary to atrial fibrillation. 2. RV normal systolic function. Cavity normal. No increase in RV wall thickness. 3. LA severely dilated. 4. RA moderately dilated. 5. The mitral valve is normal in structure. Moderate thickening of the mitral valve leaflet. No evidence of mitral valve stenosis. 6. The tricuspid valve is normal in structure. 7. The aortic valve is tricuspid Moderate thickening of the aortic valve mild stenosis of the aortic valve. 8.  The aortic root is normal in size and structure. 9. Pulmonary hypertension is indeterminant, inadequate TR jet. 10. The inferior vena cava was dilated in size with >50% respiratory variability.  Cardiac cath 02/01/16:   Ost LAD lesion, 100% stenosed.  Ost Ramus lesion, 100% stenosed.  Prox RCA lesion, 70% stenosed.  Mid RCA lesion, 50% stenosed.  RIMA .  LIMA .  Prox LAD to Mid LAD lesion, 75% stenosed.  SVG .  Origin lesion, 99% stenosed. Post intervention, there is a 0% residual stenosis.  The left ventricular systolic function is normal. Past Medical History:  Diagnosis Date  . Atrial flutter  ([place])    a. s/p remote ablation by Dr. [name].  [name] CAD (coronary artery disease)    a. s/p CABG 2003. b. 01/2016: unstable angina - s/p BMS to SVG-ramus intermediate, patent LIMA-mLAD, patent RIMA-dRCA.   [name] CHF (congestive heart failure) ([place]) 11/2010   seen at [facility] then started lasix  . Chronic atrial fibrillation    a. initially post op CABG then chronic. On Coumadin.  . Chronic lower back pain   . Dysrhythmia 2000   4 or 5 cardioversions.  . Essential hypertension   . High cholesterol   . Ischemic cardiomyopathy    a. Cath 01/2016 -  EF 50-55% with mild distal inferior hypocontractility.  . Mild aortic valve stenosis    PER ECHO 10-23-2018 IN EPIC   . Unspecified hereditary and idiopathic peripheral neuropathy 04/04/2014    Past Surgical History:  Procedure Laterality Date  . BACK SURGERY    . CARDIAC CATHETERIZATION  ~ 2000; 2003  . CARDIAC CATHETERIZATION N/A 02/01/2016   Procedure: Left Heart Cath and Cors/Grafts Angiography;  Surgeon: [name], MD;  Location: [place] [facility];  Service: Cardiovascular;  Laterality: N/A;  . CARDIAC CATHETERIZATION N/A 02/01/2016   Procedure: Coronary Stent Intervention;  Surgeon: [name], MD;  Location: [place] [facility];  Service: Cardiovascular;  Laterality: N/A;  . CATARACT EXTRACTION W/PHACO Left 02/09/2019   Procedure: CATARACT EXTRACTION PHACO AND INTRAOCULAR LENS PLACEMENT (IOC);  Surgeon: [name], MD;  Location: [facility];  Service: Ophthalmology;  Laterality: Left;  CDE: 27.70  . CATARACT EXTRACTION W/PHACO Right 02/23/2019   Procedure: CATARACT EXTRACTION PHACO AND INTRAOCULAR LENS PLACEMENT RIGHT EYE;  Surgeon: [name], MD;  Location: [facility];  Service: Ophthalmology;  Laterality: Right;  CDE: 10.15  . COLONOSCOPY  06/04/2002   Normal colon and rectum  . COLONOSCOPY  07/08/2012   Procedure: COLONOSCOPY;  Surgeon: [name], MD;  [facility];  Service: Endoscopy;  Laterality: N/A;  11:30  . COLONOSCOPY  N/A 10/02/2017   Procedure: COLONOSCOPY;  Surgeon: [name], MD;  [facility];  Service: Endoscopy;  Laterality: N/A;  10:30am  . CORONARY ANGIOPLASTY WITH STENT PLACEMENT  02/01/2016  . CORONARY ARTERY BYPASS GRAFT  2003   LIMA to LAD,LIMA to distal RCA,SVG to ramus intermediate vessel.  . FOREIGN BODY REMOVAL Right 04/02/2014   Procedure: FOREIGN BODY REMOVAL RIGHT FOOT;  Surgeon: [name], MD;  Location: [facility];  Service: General;  Laterality: Right;  . JOINT REPLACEMENT    . JOINT REPLACEMENT    . LUMBAR DISC SURGERY    . REVISION TOTAL HIP ARTHROPLASTY Bilateral 2005-2012   right-left  . SHOULDER OPEN ROTATOR CUFF REPAIR Right   . TOTAL HIP ARTHROPLASTY Right 1999  . TOTAL HIP ARTHROPLASTY Left 2008  . 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    MEDICATIONS: . aspirin EC 81 MG tablet  . atorvastatin (LIPITOR) 80 MG tablet  . diltiazem (DILACOR XR) 240 MG 24 hr capsule  . furosemide (LASIX) 40 MG tablet  . isosorbide mononitrate (IMDUR) 30 MG 24 hr tablet  . metoprolol succinate (TOPROL-XL) 50 MG 24 hr tablet  . nitroGLYCERIN (NITROSTAT) 0.4 MG SL tablet  . potassium chloride (K-DUR) 10 MEQ tablet  . ramipril (ALTACE) 2.5 MG capsule  . warfarin (COUMADIN) 7.5 MG tablet   No current facility-administered medications for this encounter.     Maia Plan [facility] Pre-Surgical Testing [phone] 03/27/19  11:15 AM

## 2019-03-30 ENCOUNTER — Encounter (HOSPITAL_COMMUNITY)
Admission: RE | Disposition: A | Discharge status: Home / Self Care | Payer: Self-pay | Source: Home / Self Care | Attending: Orthopedic Surgery

## 2019-03-30 ENCOUNTER — Encounter (HOSPITAL_COMMUNITY): Payer: Self-pay | Admitting: Anesthesiology

## 2019-03-30 ENCOUNTER — Other Ambulatory Visit: Payer: Self-pay

## 2019-03-30 ENCOUNTER — Inpatient Hospital Stay (HOSPITAL_COMMUNITY): Payer: Medicare HMO | Admitting: Emergency Medicine

## 2019-03-30 ENCOUNTER — Inpatient Hospital Stay (HOSPITAL_COMMUNITY): Payer: Medicare HMO

## 2019-03-30 ENCOUNTER — Inpatient Hospital Stay (HOSPITAL_COMMUNITY)
Admission: RE | Admit: 2019-03-30 | Discharge: 2019-03-31 | DRG: 467 | Disposition: A | Discharge status: Home / Self Care | Payer: Medicare HMO | Attending: Orthopedic Surgery | Admitting: Orthopedic Surgery

## 2019-03-30 DIAGNOSIS — Z538 Procedure and treatment not carried out for other reasons: Secondary | ICD-10-CM | POA: Diagnosis not present

## 2019-03-30 DIAGNOSIS — Y831 Surgical operation with implant of artificial internal device as the cause of abnormal reaction of the patient, or of later complication, without mention of misadventure at the time of the procedure: Secondary | ICD-10-CM | POA: Diagnosis present

## 2019-03-30 DIAGNOSIS — E7800 Pure hypercholesterolemia, unspecified: Secondary | ICD-10-CM | POA: Diagnosis present

## 2019-03-30 DIAGNOSIS — Z96649 Presence of unspecified artificial hip joint: Secondary | ICD-10-CM

## 2019-03-30 DIAGNOSIS — I2510 Atherosclerotic heart disease of native coronary artery without angina pectoris: Secondary | ICD-10-CM | POA: Diagnosis present

## 2019-03-30 DIAGNOSIS — Z8249 Family history of ischemic heart disease and other diseases of the circulatory system: Secondary | ICD-10-CM | POA: Diagnosis not present

## 2019-03-30 DIAGNOSIS — I739 Peripheral vascular disease, unspecified: Secondary | ICD-10-CM | POA: Diagnosis not present

## 2019-03-30 DIAGNOSIS — Z7982 Long term (current) use of aspirin: Secondary | ICD-10-CM | POA: Diagnosis not present

## 2019-03-30 DIAGNOSIS — Z96641 Presence of right artificial hip joint: Secondary | ICD-10-CM | POA: Diagnosis not present

## 2019-03-30 DIAGNOSIS — I509 Heart failure, unspecified: Secondary | ICD-10-CM | POA: Diagnosis present

## 2019-03-30 DIAGNOSIS — Z955 Presence of coronary angioplasty implant and graft: Secondary | ICD-10-CM

## 2019-03-30 DIAGNOSIS — Z87891 Personal history of nicotine dependence: Secondary | ICD-10-CM

## 2019-03-30 DIAGNOSIS — I482 Chronic atrial fibrillation, unspecified: Secondary | ICD-10-CM | POA: Diagnosis not present

## 2019-03-30 DIAGNOSIS — S80829A Blister (nonthermal), unspecified lower leg, initial encounter: Secondary | ICD-10-CM | POA: Diagnosis not present

## 2019-03-30 DIAGNOSIS — M1611 Unilateral primary osteoarthritis, right hip: Secondary | ICD-10-CM | POA: Diagnosis not present

## 2019-03-30 DIAGNOSIS — I10 Essential (primary) hypertension: Secondary | ICD-10-CM | POA: Diagnosis not present

## 2019-03-30 DIAGNOSIS — I110 Hypertensive heart disease with heart failure: Secondary | ICD-10-CM | POA: Diagnosis present

## 2019-03-30 DIAGNOSIS — Z7901 Long term (current) use of anticoagulants: Secondary | ICD-10-CM | POA: Diagnosis not present

## 2019-03-30 DIAGNOSIS — Z951 Presence of aortocoronary bypass graft: Secondary | ICD-10-CM

## 2019-03-30 DIAGNOSIS — I255 Ischemic cardiomyopathy: Secondary | ICD-10-CM | POA: Diagnosis not present

## 2019-03-30 DIAGNOSIS — Z471 Aftercare following joint replacement surgery: Secondary | ICD-10-CM | POA: Diagnosis not present

## 2019-03-30 DIAGNOSIS — Z96642 Presence of left artificial hip joint: Secondary | ICD-10-CM | POA: Diagnosis present

## 2019-03-30 DIAGNOSIS — T84030A Mechanical loosening of internal right hip prosthetic joint, initial encounter: Principal | ICD-10-CM | POA: Diagnosis present

## 2019-03-30 DIAGNOSIS — T84018A Broken internal joint prosthesis, other site, initial encounter: Secondary | ICD-10-CM

## 2019-03-30 DIAGNOSIS — Z79899 Other long term (current) drug therapy: Secondary | ICD-10-CM | POA: Diagnosis not present

## 2019-03-30 HISTORY — PX: FEMORAL REVISION: SHX5830

## 2019-03-30 LAB — PROTIME-INR
INR: 1.2 (ref 0.8–1.2)
Prothrombin Time: 14.7 seconds (ref 11.4–15.2)

## 2019-03-30 LAB — TYPE AND SCREEN
ABO/RH(D): O POS
Antibody Screen: NEGATIVE

## 2019-03-30 SURGERY — FEMORAL REVISION
Anesthesia: Spinal | Site: Hip | Laterality: Right

## 2019-03-30 MED ORDER — BISACODYL 10 MG RE SUPP: 10.0000 mg | Freq: Every day | RECTAL | Status: DC | PRN

## 2019-03-30 MED ORDER — PROPOFOL 10 MG/ML IV BOLUS
INTRAVENOUS | Status: AC
  Filled 2019-03-30: qty 20

## 2019-03-30 MED ORDER — DOCUSATE SODIUM 100 MG PO CAPS
100.0000 mg | ORAL_CAPSULE | Freq: Two times a day (BID) | ORAL | Status: DC
  Administered 2019-03-30 – 2019-03-31 (×2): 100 mg via ORAL
  Filled 2019-03-30 (×2): qty 1

## 2019-03-30 MED ORDER — LACTATED RINGERS IV SOLN: INTRAVENOUS | Status: DC

## 2019-03-30 MED ORDER — NITROGLYCERIN 0.4 MG SL SUBL: 0.4000 mg | SUBLINGUAL_TABLET | SUBLINGUAL | Status: DC | PRN

## 2019-03-30 MED ORDER — SODIUM CHLORIDE 0.9 % IR SOLN
Status: DC | PRN
  Administered 2019-03-30: 1000 mL

## 2019-03-30 MED ORDER — SUFENTANIL CITRATE 50 MCG/ML IV SOLN
INTRAVENOUS | Status: AC
  Filled 2019-03-30: qty 1

## 2019-03-30 MED ORDER — BUPIVACAINE HCL 0.25 % IJ SOLN
INTRAMUSCULAR | Status: DC | PRN
  Administered 2019-03-30: 30 mL

## 2019-03-30 MED ORDER — MORPHINE SULFATE (PF) 2 MG/ML IV SOLN: 0.5000 mg | INTRAVENOUS | Status: DC | PRN

## 2019-03-30 MED ORDER — PROPOFOL 10 MG/ML IV BOLUS
INTRAVENOUS | Status: AC
  Filled 2019-03-30: qty 60

## 2019-03-30 MED ORDER — ISOSORBIDE MONONITRATE ER 30 MG PO TB24
30.0000 mg | ORAL_TABLET | Freq: Every day | ORAL | Status: DC
  Administered 2019-03-31: 30 mg via ORAL
  Filled 2019-03-30: qty 1

## 2019-03-30 MED ORDER — SUGAMMADEX SODIUM 500 MG/5ML IV SOLN
INTRAVENOUS | Status: AC
  Filled 2019-03-30: qty 5

## 2019-03-30 MED ORDER — DEXAMETHASONE SODIUM PHOSPHATE 10 MG/ML IJ SOLN
INTRAMUSCULAR | Status: DC | PRN
  Administered 2019-03-30: 10 mg via INTRAVENOUS

## 2019-03-30 MED ORDER — WARFARIN SODIUM 5 MG PO TABS
7.5000 mg | ORAL_TABLET | Freq: Once | ORAL | Status: AC
  Administered 2019-03-30: 7.5 mg via ORAL
  Filled 2019-03-30: qty 1

## 2019-03-30 MED ORDER — ATORVASTATIN CALCIUM 40 MG PO TABS
80.0000 mg | ORAL_TABLET | Freq: Every day | ORAL | Status: DC
  Administered 2019-03-31: 80 mg via ORAL
  Filled 2019-03-30 (×2): qty 2

## 2019-03-30 MED ORDER — DEXAMETHASONE SODIUM PHOSPHATE 10 MG/ML IJ SOLN
10.0000 mg | Freq: Once | INTRAMUSCULAR | Status: AC
  Administered 2019-03-31: 10 mg via INTRAVENOUS
  Filled 2019-03-30: qty 1

## 2019-03-30 MED ORDER — ONDANSETRON HCL 4 MG PO TABS: 4.0000 mg | ORAL_TABLET | Freq: Four times a day (QID) | ORAL | Status: DC | PRN

## 2019-03-30 MED ORDER — WARFARIN - PHARMACIST DOSING INPATIENT: Freq: Every day | Status: DC

## 2019-03-30 MED ORDER — LIDOCAINE 2% (20 MG/ML) 5 ML SYRINGE
INTRAMUSCULAR | Status: AC
  Filled 2019-03-30: qty 5

## 2019-03-30 MED ORDER — HYDROCODONE-ACETAMINOPHEN 5-325 MG PO TABS
1.0000 | ORAL_TABLET | ORAL | Status: DC | PRN
  Administered 2019-03-30 – 2019-03-31 (×2): 1 via ORAL
  Administered 2019-03-31 (×2): 2 via ORAL
  Filled 2019-03-30: qty 2
  Filled 2019-03-30 (×2): qty 1
  Filled 2019-03-30: qty 2

## 2019-03-30 MED ORDER — PROPOFOL 500 MG/50ML IV EMUL
INTRAVENOUS | Status: DC | PRN
  Administered 2019-03-30: 75 ug/kg/min via INTRAVENOUS

## 2019-03-30 MED ORDER — TRANEXAMIC ACID-NACL 1000-0.7 MG/100ML-% IV SOLN
1000.0000 mg | INTRAVENOUS | Status: AC
  Administered 2019-03-30: 1000 mg via INTRAVENOUS
  Filled 2019-03-30: qty 100

## 2019-03-30 MED ORDER — ONDANSETRON HCL 4 MG/2ML IJ SOLN
INTRAMUSCULAR | Status: DC | PRN
  Administered 2019-03-30: 4 mg via INTRAVENOUS

## 2019-03-30 MED ORDER — SODIUM CHLORIDE (PF) 0.9 % IJ SOLN
INTRAMUSCULAR | Status: AC
  Filled 2019-03-30: qty 10

## 2019-03-30 MED ORDER — ONDANSETRON HCL 4 MG/2ML IJ SOLN: 4.0000 mg | Freq: Four times a day (QID) | INTRAMUSCULAR | Status: DC | PRN

## 2019-03-30 MED ORDER — BUPIVACAINE IN DEXTROSE 0.75-8.25 % IT SOLN
INTRATHECAL | Status: DC | PRN
  Administered 2019-03-30: 2 mL via INTRATHECAL

## 2019-03-30 MED ORDER — ACETAMINOPHEN 325 MG PO TABS: 325.0000 mg | ORAL_TABLET | Freq: Four times a day (QID) | ORAL | Status: DC | PRN

## 2019-03-30 MED ORDER — METHOCARBAMOL 500 MG IVPB - SIMPLE MED
500.0000 mg | Freq: Four times a day (QID) | INTRAVENOUS | Status: DC | PRN
  Filled 2019-03-30: qty 50

## 2019-03-30 MED ORDER — CEFAZOLIN SODIUM-DEXTROSE 2-4 GM/100ML-% IV SOLN
2.0000 g | Freq: Once | INTRAVENOUS | Status: AC
  Administered 2019-03-30: 2 g via INTRAVENOUS
  Filled 2019-03-30: qty 100

## 2019-03-30 MED ORDER — LACTATED RINGERS IV SOLN
INTRAVENOUS | Status: DC | PRN
  Administered 2019-03-30 (×3): via INTRAVENOUS

## 2019-03-30 MED ORDER — METOPROLOL SUCCINATE ER 50 MG PO TB24
75.0000 mg | ORAL_TABLET | Freq: Every day | ORAL | Status: DC
  Administered 2019-03-31: 75 mg via ORAL
  Filled 2019-03-30: qty 1

## 2019-03-30 MED ORDER — SUCCINYLCHOLINE CHLORIDE 200 MG/10ML IV SOSY
PREFILLED_SYRINGE | INTRAVENOUS | Status: AC
  Filled 2019-03-30: qty 10

## 2019-03-30 MED ORDER — METHOCARBAMOL 500 MG PO TABS
500.0000 mg | ORAL_TABLET | Freq: Four times a day (QID) | ORAL | Status: DC | PRN
  Administered 2019-03-30 – 2019-03-31 (×2): 500 mg via ORAL
  Filled 2019-03-30 (×2): qty 1

## 2019-03-30 MED ORDER — TRAMADOL HCL 50 MG PO TABS: 50.0000 mg | ORAL_TABLET | Freq: Four times a day (QID) | ORAL | Status: DC | PRN

## 2019-03-30 MED ORDER — METOCLOPRAMIDE HCL 5 MG/ML IJ SOLN: 5.0000 mg | Freq: Three times a day (TID) | INTRAMUSCULAR | Status: DC | PRN

## 2019-03-30 MED ORDER — SODIUM CHLORIDE 0.9 % IV SOLN
INTRAVENOUS | Status: DC | PRN
  Administered 2019-03-30: 25 ug/min via INTRAVENOUS

## 2019-03-30 MED ORDER — TRANEXAMIC ACID-NACL 1000-0.7 MG/100ML-% IV SOLN
1000.0000 mg | Freq: Once | INTRAVENOUS | Status: AC
  Administered 2019-03-30: 1000 mg via INTRAVENOUS
  Filled 2019-03-30: qty 100

## 2019-03-30 MED ORDER — CEFAZOLIN SODIUM-DEXTROSE 2-4 GM/100ML-% IV SOLN
2.0000 g | Freq: Four times a day (QID) | INTRAVENOUS | Status: AC
  Administered 2019-03-30 – 2019-03-31 (×2): 2 g via INTRAVENOUS
  Filled 2019-03-30 (×2): qty 100

## 2019-03-30 MED ORDER — ONDANSETRON HCL 4 MG/2ML IJ SOLN
INTRAMUSCULAR | Status: AC
  Filled 2019-03-30: qty 2

## 2019-03-30 MED ORDER — DILTIAZEM HCL ER COATED BEADS 240 MG PO CP24
240.0000 mg | ORAL_CAPSULE | Freq: Every day | ORAL | Status: DC
  Administered 2019-03-31: 240 mg via ORAL
  Filled 2019-03-30: qty 1

## 2019-03-30 MED ORDER — PROPOFOL 10 MG/ML IV BOLUS
INTRAVENOUS | Status: DC | PRN
  Administered 2019-03-30: 30 mg via INTRAVENOUS
  Administered 2019-03-30: 20 mg via INTRAVENOUS

## 2019-03-30 MED ORDER — PHENOL 1.4 % MT LIQD: 1.0000 | OROMUCOSAL | Status: DC | PRN

## 2019-03-30 MED ORDER — MENTHOL 3 MG MT LOZG: 1.0000 | LOZENGE | OROMUCOSAL | Status: DC | PRN

## 2019-03-30 MED ORDER — PHENYLEPHRINE 40 MCG/ML (10ML) SYRINGE FOR IV PUSH (FOR BLOOD PRESSURE SUPPORT)
PREFILLED_SYRINGE | INTRAVENOUS | Status: DC | PRN
  Administered 2019-03-30 (×2): 80 ug via INTRAVENOUS

## 2019-03-30 MED ORDER — ROCURONIUM BROMIDE 10 MG/ML (PF) SYRINGE
PREFILLED_SYRINGE | INTRAVENOUS | Status: AC
  Filled 2019-03-30: qty 10

## 2019-03-30 MED ORDER — SUFENTANIL CITRATE 50 MCG/ML IV SOLN
INTRAVENOUS | Status: DC | PRN
  Administered 2019-03-30: 10 ug via INTRAVENOUS

## 2019-03-30 MED ORDER — MAGNESIUM CITRATE PO SOLN: 1.0000 | Freq: Once | ORAL | Status: DC | PRN

## 2019-03-30 MED ORDER — DEXAMETHASONE SODIUM PHOSPHATE 10 MG/ML IJ SOLN
INTRAMUSCULAR | Status: AC
  Filled 2019-03-30: qty 1

## 2019-03-30 MED ORDER — METOCLOPRAMIDE HCL 5 MG PO TABS: 5.0000 mg | ORAL_TABLET | Freq: Three times a day (TID) | ORAL | Status: DC | PRN

## 2019-03-30 MED ORDER — BUPIVACAINE HCL (PF) 0.25 % IJ SOLN
INTRAMUSCULAR | Status: AC
  Filled 2019-03-30: qty 30

## 2019-03-30 MED ORDER — FUROSEMIDE 40 MG PO TABS
40.0000 mg | ORAL_TABLET | Freq: Every day | ORAL | Status: DC
  Administered 2019-03-31: 40 mg via ORAL
  Filled 2019-03-30: qty 1

## 2019-03-30 MED ORDER — SODIUM CHLORIDE 0.9 % IV SOLN
INTRAVENOUS | Status: DC
  Administered 2019-03-30: 20:00:00 via INTRAVENOUS

## 2019-03-30 MED ORDER — ACETAMINOPHEN 10 MG/ML IV SOLN
1000.0000 mg | Freq: Once | INTRAVENOUS | Status: AC
  Administered 2019-03-30: 1000 mg via INTRAVENOUS
  Filled 2019-03-30: qty 100

## 2019-03-30 MED ORDER — POLYETHYLENE GLYCOL 3350 17 G PO PACK: 17.0000 g | PACK | Freq: Every day | ORAL | Status: DC | PRN

## 2019-03-30 SURGICAL SUPPLY — 74 items
BAG DECANTER FOR FLEXI CONT (MISCELLANEOUS) ×2 IMPLANT
BAG ZIPLOCK 12X15 (MISCELLANEOUS) ×4 IMPLANT
BIT DRILL 2.8X128 (BIT) ×2 IMPLANT
BLADE EXTENDED COATED 6.5IN (ELECTRODE) ×2 IMPLANT
BLADE SAW SAG 73X25 THK (BLADE)
BLADE SAW SGTL 73X25 THK (BLADE) IMPLANT
BRUSH FEMORAL CANAL (MISCELLANEOUS) IMPLANT
CLSR STERI-STRIP ANTIMIC 1/2X4 (GAUZE/BANDAGES/DRESSINGS) ×1 IMPLANT
COVER SURGICAL LIGHT HANDLE (MISCELLANEOUS) ×2 IMPLANT
COVER WAND RF STERILE (DRAPES) IMPLANT
DRAPE INCISE IOBAN 66X45 STRL (DRAPES) ×2 IMPLANT
DRAPE ORTHO SPLIT 77X108 STRL (DRAPES) ×2
DRAPE POUCH INSTRU U-SHP 10X18 (DRAPES) ×2 IMPLANT
DRAPE SURG ORHT 6 SPLT 77X108 (DRAPES) ×2 IMPLANT
DRAPE U-SHAPE 47X51 STRL (DRAPES) ×2 IMPLANT
DRSG ADAPTIC 3X8 NADH LF (GAUZE/BANDAGES/DRESSINGS) ×1 IMPLANT
DRSG EMULSION OIL 3X16 NADH (GAUZE/BANDAGES/DRESSINGS) ×1 IMPLANT
DRSG MEPILEX BORDER 4X12 (GAUZE/BANDAGES/DRESSINGS) ×1 IMPLANT
DRSG MEPILEX BORDER 4X4 (GAUZE/BANDAGES/DRESSINGS) ×3 IMPLANT
DRSG MEPILEX BORDER 4X8 (GAUZE/BANDAGES/DRESSINGS) ×1 IMPLANT
DRSG TEGADERM 2-3/8X2-3/4 SM (GAUZE/BANDAGES/DRESSINGS) ×1 IMPLANT
DRSG TEGADERM 4X4.75 (GAUZE/BANDAGES/DRESSINGS) ×1 IMPLANT
DURAPREP 26ML APPLICATOR (WOUND CARE) ×2 IMPLANT
ELECT REM PT RETURN 15FT ADLT (MISCELLANEOUS) ×2 IMPLANT
EVACUATOR 1/8 PVC DRAIN (DRAIN) ×2 IMPLANT
GAUZE SPONGE 4X4 12PLY STRL (GAUZE/BANDAGES/DRESSINGS) ×1 IMPLANT
GLOVE BIO SURGEON STRL SZ7 (GLOVE) ×2 IMPLANT
GLOVE BIO SURGEON STRL SZ8 (GLOVE) ×2 IMPLANT
GLOVE BIOGEL PI IND STRL 7.0 (GLOVE) ×1 IMPLANT
GLOVE BIOGEL PI IND STRL 8 (GLOVE) ×1 IMPLANT
GLOVE BIOGEL PI INDICATOR 7.0 (GLOVE) ×1
GLOVE BIOGEL PI INDICATOR 8 (GLOVE) ×1
GOWN STRL REUS W/TWL LRG LVL3 (GOWN DISPOSABLE) ×4 IMPLANT
GUIDEWIRE BALL NOSE 80CM (WIRE) ×1 IMPLANT
HANDPIECE INTERPULSE COAX TIP (DISPOSABLE)
HEAD FEMORAL SROM 32 PLUS 0 (Hips) IMPLANT
IMMOBILIZER KNEE 20 (SOFTGOODS)
IMMOBILIZER KNEE 20 THIGH 36 (SOFTGOODS) IMPLANT
KIT TURNOVER KIT A (KITS) IMPLANT
LINER ACET PINNACLE +4X32X64 (Orthopedic Implant) ×1 IMPLANT
MANIFOLD NEPTUNE II (INSTRUMENTS) ×2 IMPLANT
MARKER SKIN DUAL TIP RULER LAB (MISCELLANEOUS) ×2 IMPLANT
NDL SAFETY ECLIPSE 18X1.5 (NEEDLE) ×1 IMPLANT
NEEDLE HYPO 18GX1.5 SHARP (NEEDLE) ×1
NS IRRIG 1000ML POUR BTL (IV SOLUTION) ×2 IMPLANT
PACK TOTAL JOINT (CUSTOM PROCEDURE TRAY) ×2 IMPLANT
PASSER SUT SWANSON 36MM LOOP (INSTRUMENTS) ×1 IMPLANT
PRESSURIZER FEMORAL UNIV (MISCELLANEOUS) IMPLANT
PROTECTOR NERVE ULNAR (MISCELLANEOUS) ×2 IMPLANT
SET HNDPC FAN SPRY TIP SCT (DISPOSABLE) IMPLANT
SLEEVE SROM 22F XXL (Hips) IMPLANT
SPONGE LAP 18X18 RF (DISPOSABLE) ×2 IMPLANT
SROM FEMORAL HEAD 32 PLUS 0 (Hips) ×2 IMPLANT
SROM SLEEVE 22F XXL (Hips) ×2 IMPLANT
STAPLER VISISTAT 35W (STAPLE) IMPLANT
STEM FEM SROM 17/22 230X36+8 (Stem) ×1 IMPLANT
SUCTION FRAZIER HANDLE 12FR (TUBING) ×1
SUCTION TUBE FRAZIER 12FR DISP (TUBING) ×1 IMPLANT
SUT ETHIBOND NAB CT1 #1 30IN (SUTURE) IMPLANT
SUT MNCRL AB 4-0 PS2 18 (SUTURE) ×1 IMPLANT
SUT STRATAFIX 0 PDS 27 VIOLET (SUTURE) ×2
SUT VIC AB 1 CT1 27 (SUTURE) ×3
SUT VIC AB 1 CT1 27XBRD ANTBC (SUTURE) ×3 IMPLANT
SUT VIC AB 2-0 CT1 27 (SUTURE) ×3
SUT VIC AB 2-0 CT1 TAPERPNT 27 (SUTURE) ×3 IMPLANT
SUTURE STRATFX 0 PDS 27 VIOLET (SUTURE) ×1 IMPLANT
SWAB COLLECTION DEVICE MRSA (MISCELLANEOUS) IMPLANT
SWAB CULTURE ESWAB REG 1ML (MISCELLANEOUS) IMPLANT
SYR 50ML LL SCALE MARK (SYRINGE) ×2 IMPLANT
TOWEL OR 17X26 10 PK STRL BLUE (TOWEL DISPOSABLE) ×4 IMPLANT
TOWER CARTRIDGE SMART MIX (DISPOSABLE) IMPLANT
TRAY FOLEY MTR SLVR 16FR STAT (SET/KITS/TRAYS/PACK) ×2 IMPLANT
WATER STERILE IRR 1000ML POUR (IV SOLUTION) ×4 IMPLANT
YANKAUER SUCT BULB TIP 10FT TU (MISCELLANEOUS) ×2 IMPLANT

## 2019-03-30 NOTE — Discharge Instructions (Addendum)
Dr. [name] Total Joint Specialist [facility] [address] [place], [place] [address] [phone]  POSTERIOR TOTAL HIP REVISION POSTOPERATIVE DIRECTIONS  Hip Rehabilitation, Guidelines Following Surgery  The results of a hip operation are greatly improved after range of motion and muscle strengthening exercises. Follow all safety measures which are given to protect your hip. If any of these exercises cause increased pain or swelling in your joint, decrease the amount until you are comfortable again. Then slowly increase the exercises. Call your caregiver if you have problems or questions.   BLOOD CLOT PREVENTION   Take 10 mg of warfarin tonight, and 10 mg of warfarin tomorrow  On Thursday, return to your normal warfarin dosing of 7.5 mg  See your coumadin clinic on Thursday to check PT/INR  [place] items at home which could result in a fall. This includes throw rugs or furniture in walking pathways.   ICE to the affected hip every three hours for 30 minutes at a time and then as needed for pain and swelling.  Continue to use ice on the hip for pain and swelling from surgery. You may notice swelling that will progress down to the foot and ankle.  This is normal after surgery.  Elevate the leg when you are not up walking on it.    Continue to use the breathing machine which will help keep your temperature down.  It is common for your temperature to cycle up and down following surgery, especially at night when you are not up moving around and exerting yourself.  The breathing machine keeps your lungs expanded and your temperature down.  DIET You may resume your previous home diet once your are discharged from the hospital.  DRESSING / WOUND CARE / SHOWERING You may change your dressing 3-5 days after surgery.  Then change the dressing every day with sterile gauze.  Please use good hand washing techniques before changing the dressing.  Do  not use any lotions or creams on the incision until instructed by your surgeon. You may start showering once you are discharged home but do not submerge the incision under water. Just pat the incision dry and apply a dry gauze dressing on daily. Change the surgical dressing daily and reapply a dry dressing each time.  ACTIVITY Walk with your walker as instructed. Use walker as long as suggested by your caregivers. Avoid periods of inactivity such as sitting longer than an hour when not asleep. This helps prevent blood clots.  You may resume a sexual relationship in one month or when given the OK by your doctor.  You may return to work once you are cleared by your doctor.  Do not drive a car for 6 weeks or until released by you surgeon.  Do not drive while taking narcotics.  WEIGHT BEARING Weight bearing as tolerated with assist device (walker, cane, etc) as directed, use it as long as suggested by your surgeon or therapist, typically at least 4-6 weeks.  POSTOPERATIVE CONSTIPATION PROTOCOL Constipation - defined medically as fewer than three stools per week and severe constipation as less than one stool per week.  One of the most common issues patients have following surgery is constipation.  Even if you have a regular bowel pattern at home, your normal regimen is likely to be disrupted due to multiple reasons following surgery.  Combination of anesthesia, postoperative narcotics, change in appetite and fluid intake all can affect your bowels.  In  order to avoid complications following surgery, here are some recommendations in order to help you during your recovery period.  Colace (docusate) - Pick up an over-the-counter form of Colace or another stool softener and take twice a day as long as you are requiring postoperative pain medications.  Take with a full glass of water daily.  If you experience loose stools or diarrhea, hold the colace until you stool forms back up.  If your symptoms do not  get better within 1 week or if they get worse, check with your doctor.  Dulcolax (bisacodyl) - Pick up over-the-counter and take as directed by the product packaging as needed to assist with the movement of your bowels.  Take with a full glass of water.  Use this product as needed if not relieved by Colace only.   MiraLax (polyethylene glycol) - Pick up over-the-counter to have on hand.  MiraLax is a solution that will increase the amount of water in your bowels to assist with bowel movements.  Take as directed and can mix with a glass of water, juice, soda, coffee, or tea.  Take if you go more than two days without a movement. Do not use MiraLax more than once per day. Call your doctor if you are still constipated or irregular after using this medication for 7 days in a row.  If you continue to have problems with postoperative constipation, please contact the office for further assistance and recommendations.  If you experience "the worst abdominal pain ever" or develop nausea or vomiting, please contact the office immediatly for further recommendations for treatment.  ITCHING  If you experience itching with your medications, try taking only a single pain pill, or even half a pain pill at a time.  You can also use Benadryl over the counter for itching or also to help with sleep.   TED HOSE STOCKINGS Wear the elastic stockings on both legs for three weeks following surgery during the day but you may remove then at night for sleeping.  MEDICATIONS See your medication summary on the After Visit Summary that the nursing staff will review with you prior to discharge.  You may have some home medications which will be placed on hold until you complete the course of blood thinner medication.  It is important for you to complete the blood thinner medication as prescribed by your surgeon.  Continue your approved medications as instructed at time of discharge.  PRECAUTIONS If you experience chest pain or  shortness of breath - call 911 immediately for transfer to the hospital emergency department.  If you develop a fever greater that 101 F, purulent drainage from wound, increased redness or drainage from wound, foul odor from the wound/dressing, or calf pain - CONTACT YOUR SURGEON.                                                   FOLLOW-UP APPOINTMENTS Make sure you keep all of your appointments after your operation with your surgeon and caregivers. You should call the office at the above phone number and make an appointment for approximately two weeks after the date of your surgery or on the date instructed by your surgeon outlined in the "After Visit Summary".  RANGE OF MOTION AND STRENGTHENING EXERCISES  These exercises are designed to help you keep full movement of your hip  joint. Follow your caregiver's or physical therapist's instructions. Perform all exercises about fifteen times, three times per day or as directed. Exercise both hips, even if you have had only one joint replacement. These exercises can be done on a training (exercise) mat, on the floor, on a table or on a bed. Use whatever works the best and is most comfortable for you. Use music or television while you are exercising so that the exercises are a pleasant break in your day. This will make your life better with the exercises acting as a break in routine you can look forward to.   Lying on your back, slowly slide your foot toward your buttocks, raising your knee up off the floor. Then slowly slide your foot back down until your leg is straight again.   Lying on your back spread your legs as far apart as you can without causing discomfort.   Lying on your side, raise your upper leg and foot straight up from the floor as far as is comfortable. Slowly lower the leg and repeat.   Lying on your back, tighten up the muscle in the front of your thigh (quadriceps muscles). You can do this by keeping your leg straight and trying to raise  your heel off the floor. This helps strengthen the largest muscle supporting your knee.   Lying on your back, tighten up the muscles of your buttocks both with the legs straight and with the knee bent at a comfortable angle while keeping your heel on the floor.   IF YOU ARE TRANSFERRED TO A SKILLED REHAB FACILITY If the patient is transferred to a skilled rehab facility following release from the hospital, a list of the current medications will be sent to the facility for the patient to continue.  When discharged from the skilled rehab facility, please have the facility set up the patient's [place] prior to being released. Also, the skilled facility will be responsible for providing the patient with their medications at time of release from the facility to include their pain medication, the muscle relaxants, and their blood thinner medication. If the patient is still at the rehab facility at time of the two week follow up appointment, the skilled rehab facility will also need to assist the patient in arranging follow up appointment in our office and any transportation needs.  MAKE SURE YOU:   Understand these instructions.   Get help right away if you are not doing well or get worse.    Pick up stool softner and laxative for home use following surgery while on pain medications. Do not submerge incision under water. Please use good hand washing techniques while changing dressing each day. May shower starting three days after surgery. Please use a clean towel to pat the incision dry following showers. Continue to use ice for pain and swelling after surgery. Do not use any lotions or creams on the incision until instructed by your surgeon.

## 2019-03-30 NOTE — Addendum Note (Signed)
Addendum  created 03/30/19 1914 by Josephine Igo, MD   Clinical Note Signed

## 2019-03-30 NOTE — Progress Notes (Signed)
ANTICOAGULATION CONSULT NOTE - Initial Consult  Pharmacy Consult for Coumadin Indication: VTE prophylaxis post-op, Afib  Allergies  Allergen Reactions  . Augmentin [Amoxicillin-Pot Clavulanate] Nausea And Vomiting and Other (See Comments)    Has patient had a PCN reaction causing immediate rash, facial/tongue/throat swelling, SOB or lightheadedness with hypotension: Unknown Has patient had a PCN reaction causing severe rash involving mucus membranes or skin necrosis: No Has patient had a PCN reaction that required hospitalization: No Has patient had a PCN reaction occurring within the last 10 years: No If all of the above answers are "NO", then may proceed with Cephalosporin use.     Patient Measurements: Height: 5\' 11"  (180.3 cm) Weight: 238 lb 1 oz (108 kg) IBW/kg (Calculated) : 75.3  Vital Signs: Temp: 97.7 F (36.5 C) (07/27 1933) Temp Source: Oral (07/27 1933) BP: 130/75 (07/27 1933) Pulse Rate: 69 (07/27 1933)  Labs: Recent Labs    03/30/19 1333  LABPROT 14.7  INR 1.2    Estimated Creatinine Clearance: 84.1 mL/min (by C-G formula based on SCr of 0.92 mg/dL).   Medical History: Past Medical History:  Diagnosis Date  . Atrial flutter ([place])    a. s/p remote ablation by Dr. [name].  [name] CAD (coronary artery disease)    a. s/p CABG 2003. b. 01/2016: unstable angina - s/p BMS to SVG-ramus intermediate, patent LIMA-mLAD, patent RIMA-dRCA.   [name] CHF (congestive heart failure) ([place]) 11/2010   seen at [facility] then started lasix  . Chronic atrial fibrillation    a. initially post op CABG then chronic. On Coumadin.  . Chronic lower back pain   . Dysrhythmia 2000   4 or 5 cardioversions.  . Essential hypertension   . High cholesterol   . Ischemic cardiomyopathy    a. Cath 01/2016 -  EF 50-55% with mild distal inferior hypocontractility.  . Mild aortic valve stenosis    PER ECHO 10-23-2018 IN [facility]   . Unspecified hereditary and idiopathic peripheral neuropathy 04/04/2014     Medications:  PTA: Coumadin 7.5mg  daily except 3.75mg  on Mon & Fri- LD 7/22  Assessment: 78 yo M admitted for THA revision.  On chronic Coumadin for AFib PTA.  Coumadin was held for 5 days pre-op and thus INR at baseline, however per outpatient records he has been on current regimen with therapeutic INR for >1 year. Pre-op CBC: Hg slightly low at 11.7, pltc WNL at 227.  No bleeding noted.   Goal of Therapy:  INR 2-3   Plan:  Coumadin 7.5mg  po x1 today Daily INR  Brennen Camper, Lavonia Drafts 03/30/2019,7:48 PM

## 2019-03-30 NOTE — Anesthesia Postprocedure Evaluation (Signed)
Anesthesia Post Note  Patient: Leonard Cisneros  Procedure(s) Performed: Femoral revision right total hip arthroplasty (Right Hip)     Patient location during evaluation: PACU Anesthesia Type: Spinal Level of consciousness: oriented and awake and alert Pain management: pain level controlled Vital Signs Assessment: post-procedure vital signs reviewed and stable Respiratory status: spontaneous breathing, respiratory function stable and nonlabored ventilation Cardiovascular status: blood pressure returned to baseline and stable Postop Assessment: no headache, no backache, no apparent nausea or vomiting, spinal receding and patient able to bend at knees Anesthetic complications: no    Last Vitals:  Vitals:   03/30/19 1845 03/30/19 1900  BP: 133/75 123/74  Pulse: (!) 54 62  Resp: 16 13  Temp:    SpO2: 98% 99%    Last Pain:  Vitals:   03/30/19 1900  TempSrc:   PainSc: 0-No pain    LLE Motor Response: Purposeful movement (03/30/19 1900) LLE Sensation: Numbness;Decreased;No pain (03/30/19 1900) RLE Motor Response: Purposeful movement (03/30/19 1900) RLE Sensation: Decreased;Numbness;No pain (03/30/19 1900) L Sensory Level: S1-Sole of foot, small toes (03/30/19 1900) R Sensory Level: L3-Anterior knee, lower leg (03/30/19 1900)  Amairany Schumpert A.

## 2019-03-30 NOTE — Anesthesia Postprocedure Evaluation (Signed)
Anesthesia Post Note  Patient: Leonard Cisneros  Procedure(s) Performed: Femoral revision right total hip arthroplasty (Right Hip)     Anesthesia Type: General    Last Vitals:  Vitals:   03/30/19 1249 03/30/19 1749  BP: (!) 152/73 96/63  Pulse: 82 65  Resp: 18 18  Temp: 37 C (!) 36.4 C  SpO2: 95% 97%    Last Pain:  Vitals:   03/30/19 1749  TempSrc:   PainSc: 0-No pain    LLE Motor Response: No movement due to regional block (03/30/19 1749) LLE Sensation: Numbness (03/30/19 1749) RLE Motor Response: No movement due to regional block (03/30/19 1749) RLE Sensation: Numbness (03/30/19 1749) L Sensory Level: T11 (03/30/19 1749) R Sensory Level: T11 (03/30/19 1749)  [name]

## 2019-03-30 NOTE — Addendum Note (Signed)
Addendum  created 03/30/19 1816 by Josephine Igo, MD   SmartForm saved

## 2019-03-30 NOTE — Anesthesia Procedure Notes (Signed)
Spinal  Patient location during procedure: OR Start time: 03/30/2019 3:20 PM End time: 03/30/2019 3:25 PM Staffing Resident/CRNA: Sharlette Dense, CRNA Performed: resident/CRNA  Preanesthetic Checklist Completed: patient identified, site marked, surgical consent, pre-op evaluation, timeout performed, IV checked, risks and benefits discussed and monitors and equipment checked Spinal Block Patient position: sitting Prep: site prepped and draped and DuraPrep Patient monitoring: heart rate, continuous pulse ox and blood pressure Approach: midline Location: L3-4 Injection technique: single-shot Needle Needle type: Sprotte  Needle gauge: 24 G Needle length: 9 cm Additional Notes Kit expiration date 07/04/2019 and lot # [id number] Clear free flow of CSF, negative heme, negative paresthesia Tolerated well.  Turned onto Right lateral position for 3 minutes then placed supine

## 2019-03-30 NOTE — Interval H&P Note (Signed)
History and Physical Interval Note:  03/30/2019 2:19 PM  Leonard Cisneros  has presented today for surgery, with the diagnosis of right total hip arthroplasty loosening.  The various methods of treatment have been discussed with the patient and family. After consideration of risks, benefits and other options for treatment, the patient has consented to  Procedure(s) with comments: Femoral revision right total hip arthroplasty (Right) - 129min as a surgical intervention.  The patient's history has been reviewed, patient examined, no change in status, stable for surgery.  I have reviewed the patient's chart and labs.  Questions were answered to the patient's satisfaction.     Pilar Plate Keithon Mccoin

## 2019-03-30 NOTE — Anesthesia Procedure Notes (Signed)
Date/Time: 03/30/2019 3:18 PM Performed by: Sharlette Dense, CRNA Oxygen Delivery Method: Simple face mask

## 2019-03-30 NOTE — Op Note (Signed)
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  PROCEDURE:  Right total hip arthroplasty revision.  SURGEON:  [name], MD  ASSISTANT:  Ardeen Jourdain, PA-C  ANESTHESIA:  Spinal.  ESTIMATED BLOOD LOSS:  625 mL.  DRAINS:  Hemovac x1.  COMPLICATIONS:  None.  CONDITION:  Stable to recovery.  BRIEF CLINICAL NOTE:  The patient is a 78 year old male status post right total hip arthroplasty many years ago.  He had a femoral revision done over 10 years ago.  He did fine and over the past year has had increasing right thigh pain.  His lab and  radiographic studies became equivocal.  Given his persistent pain and dysfunction, he presents now for femoral versus total hip arthroplasty revision.  PROCEDURE IN DETAIL:  After successful administration of spinal anesthetic, the patient was placed in the left lateral decubitus position with the right side up and held with a hip positioner.  Right lower extremity was isolated from his perineum with  plastic drapes and prepped and draped in the usual sterile fashion.  Previous posterolateral incisions were utilized.  Skin cut with a 10 blade through subcutaneous tissue to the fascia lata, which was incised in line with the skin incision.  Sciatic  nerve was palpated and protected.  He had a trochanteric nonunion.  I debrided the soft tissue from the posterior aspect of the greater trochanter, which was all scar tissue.  I was able to reflect the trochanter superior and anterior.  This gave  exposure of the joint.  The hip was dislocated.  The femoral head was removed from the stem.  Fortunately,  the stem was grossly loose.  I was able to place the ring extractor in the stem and remove it with no difficulty.  There was fibrous tissue  throughout the proximal femur, which was removed.  The pedestal was present distally.  I used a long drill to drill through the pedestal.  I then placed the guidewire for the flexible reamers.  The guidewire was in the canal distally as I was able to  feel all 4 quadrants of the femoral canal.  I reamed over the guidewire up to 17 mm, which was the largest diameter flexible reamer.  I then used a guidewire to palpate the canal, and I was in the canal, again all throughout the length of the canal.  We  then did the straight reamers for the S-ROM starting at 10 and coursing up to 18.5 mm, which was 1.5 mm over the outer diameter of the 22 x 17 stem, which would allow for passage of a long stem.  We then reamed proximally starting for the sleeve up to a  22 F and machined the sleeve to an extra extra large.  The trial sleeve was left intact, and then the femur was retracted anteriorly to gain acetabular exposure.  Given that he had a deficient abductor mechanism, I decided that I was going to revise the  acetabular liner to a constrained system to prevent instability as his soft tissue was not going to provide any benefit.  The greater trochanter was not amenable to fixation back to the femur given the longstanding nonunion.  I then removed the  acetabular liner  from the acetabular shell.  The shell is in excellent position and is well fixed.  I then thoroughly irrigated the shell to remove any fibrous tissue.  All the tissue was then removed from the edge of the shell.  I then placed the  Pinnacle constrained liner for the size 64 cup.  I had a 32 mm inner diameter for the 32 head.  We then went back to preparing and implanting the femoral component.  The trial sleeve was removed and the permanent 22 F extra extra large sleeve was impacted into the proximal femur.  The  22 x 17 long straight stem with a 36+8 neck was then impacted in  about 20 degrees of anteversion.  It had fantastic fit.  We then placed the 32.0 trial head and reduced the femoral head into the constrained liner.  The locking ring was then impacted over the lip of the liner.  The hip had fantastic motion with full  extension, full external rotation, 70 degrees flexion, 40 degrees adduction and about 70 degrees of internal rotation and 90 degrees of flexion and about 70 degrees of internal rotation.  There was absolutely no impingement that was occurring through  this range of motion.  The wound was then copiously irrigated with saline solution, and the posterior pseudocapsule was reattached to the trochanter and to the femoral shaft with interrupted #1 Ethibond suture.  The fascia lata was closed over a Hemovac  drain with a running 0 Stratafix suture.  Subcu was closed in 2 layers with 0 and 2-0 Vicryl and subcuticular running 4-0 Monocryl.  Incisions were cleaned and dried and Steri-Strips and a bulky sterile dressing applied.  The drain was hooked to suction,  and he was then awakened and transported to recovery in stable condition.  Note that a surgical assistant is a medical necessity for this procedure to do it in a safe and expeditious manner.  Surgical assistance is necessary for proper positioning of the limb and for retraction of vital neurovascular structures.  This proper  positioning of the limb allowed for safe removal of the old implant and for safe and accurate placement of the new implant.  LN/NUANCE  D:03/30/2019 T:03/30/2019 JOB:[id number]

## 2019-03-30 NOTE — Brief Op Note (Signed)
03/30/2019  5:29 PM  PATIENT:  Leonard Cisneros  78 y.o. male  PRE-OPERATIVE DIAGNOSIS:  right total hip arthroplasty loosening  POST-OPERATIVE DIAGNOSIS:  right total hip arthroplasty loosening  PROCEDURE:  Right Total Hip Arthroplasty Revision  SURGEON:  Surgeon(s) and Role:    [name], MD - Primary  PHYSICIAN ASSISTANT:   ASSISTANTS: Ardeen Jourdain, PA-C   ANESTHESIA:   spinal  EBL:  625 mL   BLOOD ADMINISTERED:none  DRAINS: (Medium) Hemovact drain(s) in the right hip with  Suction Open   LOCAL MEDICATIONS USED:  MARCAINE     COUNTS:  YES  TOURNIQUET:  * No tourniquets in log *  DICTATION: .Other Dictation: Dictation Number [id number]  PLAN OF CARE: Admit to inpatient   PATIENT DISPOSITION:  PACU - hemodynamically stable.

## 2019-03-31 ENCOUNTER — Encounter (HOSPITAL_COMMUNITY): Payer: Self-pay | Admitting: Orthopedic Surgery

## 2019-03-31 LAB — BASIC METABOLIC PANEL
Anion gap: 6 (ref 5–15)
BUN: 14 mg/dL (ref 8–23)
CO2: 22 mmol/L (ref 22–32)
Calcium: 8.7 mg/dL — ABNORMAL LOW (ref 8.9–10.3)
Chloride: 109 mmol/L (ref 98–111)
Creatinine, Ser: 0.72 mg/dL (ref 0.61–1.24)
GFR calc Af Amer: >60 mL/min (ref >60)
GFR calc non Af Amer: >60 mL/min (ref >60)
Glucose, Bld: 171 mg/dL — ABNORMAL HIGH (ref 70–99)
Potassium: 4.5 mmol/L (ref 3.5–5.1)
Sodium: 137 mmol/L (ref 135–145)

## 2019-03-31 LAB — CBC
HCT: 32 % — ABNORMAL LOW (ref 39.0–52.0)
Hemoglobin: 10.2 g/dL — ABNORMAL LOW (ref 13.0–17.0)
MCH: 34.5 pg — ABNORMAL HIGH (ref 26.0–34.0)
MCHC: 31.9 g/dL (ref 30.0–36.0)
MCV: 108.1 fL — ABNORMAL HIGH (ref 80.0–100.0)
Platelets: 160 10*3/uL (ref 150–400)
RBC: 2.96 MIL/uL — ABNORMAL LOW (ref 4.22–5.81)
RDW: 13.3 % (ref 11.5–15.5)
WBC: 10 10*3/uL (ref 4.0–10.5)
nRBC: 0 % (ref 0.0–0.2)

## 2019-03-31 LAB — PROTIME-INR
INR: 1.1 (ref 0.8–1.2)
Prothrombin Time: 14.4 seconds (ref 11.4–15.2)

## 2019-03-31 MED ORDER — METHOCARBAMOL 500 MG PO TABS: 500.0000 mg | ORAL_TABLET | Freq: Four times a day (QID) | ORAL | 0 refills | Status: DC | PRN

## 2019-03-31 MED ORDER — TRAMADOL HCL 50 MG PO TABS: 50.0000 mg | ORAL_TABLET | Freq: Four times a day (QID) | ORAL | 0 refills | Status: DC | PRN

## 2019-03-31 MED ORDER — WARFARIN SODIUM 5 MG PO TABS: 7.5000 mg | ORAL_TABLET | Freq: Once | ORAL | Status: DC

## 2019-03-31 MED ORDER — HYDROCODONE-ACETAMINOPHEN 5-325 MG PO TABS: 1.0000 | ORAL_TABLET | Freq: Four times a day (QID) | ORAL | 0 refills | Status: DC | PRN

## 2019-03-31 NOTE — Progress Notes (Signed)
   Subjective: 1 Day Post-Op Procedure(s) (LRB): Femoral revision right total hip arthroplasty (Right) Patient reports pain as mild.   Patient seen in rounds by Dr. Wynelle Link. Patient is well, and has had no acute complaints or problems, no issues overnight. Denies chest pain or SOB. Foley catheter removed this AM. We will begin therapy today.   Objective: Vital signs in last 24 hours: Temp:  [97.5 F (36.4 C)-98.6 F (37 C)] 98 F (36.7 C) (07/28 0422) Pulse Rate:  [54-89] 66 (07/28 0422) Resp:  [13-20] 18 (07/28 0422) BP: (96-152)/(63-82) 116/82 (07/28 0422) SpO2:  [95 %-100 %] 97 % (07/28 0422) Weight:  [557 kg] 108 kg (07/27 1327)  Intake/Output from previous day:  Intake/Output Summary (Last 24 hours) at 03/31/2019 0709 Last data filed at 03/31/2019 0645 Gross per 24 hour  Intake 3301.84 ml  Output 2950 ml  Net 351.84 ml    Labs: Recent Labs    03/31/19 0256  HGB 10.2*   Recent Labs    03/31/19 0256  WBC 10.0  RBC 2.96*  HCT 32.0*  PLT 160   Recent Labs    03/31/19 0256  NA 137  K 4.5  CL 109  CO2 22  BUN 14  CREATININE 0.72  GLUCOSE 171*  CALCIUM 8.7*   Recent Labs    03/30/19 1333 03/31/19 0256  INR 1.2 1.1    Exam: General - Patient is Alert and Oriented Extremity - Neurologically intact Neurovascular intact Sensation intact distally Dorsiflexion/Plantar flexion intact Dressing - dressing C/D/I Motor Function - intact, moving foot and toes well on exam.   Past Medical History:  Diagnosis Date  . Atrial flutter ([place])    a. s/p remote ablation by Dr. [name].  [name] CAD (coronary artery disease)    a. s/p CABG 2003. b. 01/2016: unstable angina - s/p BMS to SVG-ramus intermediate, patent LIMA-mLAD, patent RIMA-dRCA.   [name] CHF (congestive heart failure) ([place]) 11/2010   seen at [facility] then started lasix  . Chronic atrial fibrillation    a. initially post op CABG then chronic. On Coumadin.  . Chronic lower back pain   . Dysrhythmia 2000   4 or 5  cardioversions.  . Essential hypertension   . High cholesterol   . Ischemic cardiomyopathy    a. Cath 01/2016 -  EF 50-55% with mild distal inferior hypocontractility.  . Mild aortic valve stenosis    PER ECHO 10-23-2018 IN [facility]   . Unspecified hereditary and idiopathic peripheral neuropathy 04/04/2014    Assessment/Plan: 1 Day Post-Op Procedure(s) (LRB): Femoral revision right total hip arthroplasty (Right) Principal Problem:   Failed total hip arthroplasty ([place])  Estimated body mass index is 33.2 kg/m as calculated from the following:   Height as of this encounter: 5\' 11"  (1.803 m).   Weight as of this encounter: 108 kg. Advance diet Up with therapy  DVT Prophylaxis - Coumadin per pharmacy consult - no lovenox bridge per patient's cardiologist Weight bearing as tolerated D/C knee immobilizer Hemovac pulled without difficulty Begin therapy Hip precautions discussed with patient   Plan is to go Home after hospital stay. Plan for discharge tomorrow pending progress with therapy.  Theresa Duty, PA-C Orthopedic Surgery 03/31/2019, 7:09 AM

## 2019-03-31 NOTE — Progress Notes (Signed)
[place] for Warfarin  Indication: VTE prophylaxis post-op, a-fib  Allergies  Allergen Reactions  . Augmentin [Amoxicillin-Pot Clavulanate] Nausea And Vomiting and Other (See Comments)    Has patient had a PCN reaction causing immediate rash, facial/tongue/throat swelling, SOB or lightheadedness with hypotension: Unknown Has patient had a PCN reaction causing severe rash involving mucus membranes or skin necrosis: No Has patient had a PCN reaction that required hospitalization: No Has patient had a PCN reaction occurring within the last 10 years: No If all of the above answers are "NO", then may proceed with Cephalosporin use.     Patient Measurements: Height: 5\' 11"  (180.3 cm) Weight: 238 lb 1 oz (108 kg) IBW/kg (Calculated) : 75.3  Vital Signs: Temp: 98.5 F (36.9 C) (07/28 0941) Temp Source: Oral (07/28 0941) BP: 128/91 (07/28 0941) Pulse Rate: 108 (07/28 0941)  Labs: Recent Labs    03/30/19 1333 03/31/19 0256  HGB  --  10.2*  HCT  --  32.0*  PLT  --  160  LABPROT 14.7 14.4  INR 1.2 1.1  CREATININE  --  0.72    Estimated Creatinine Clearance: 96.7 mL/min (by C-G formula based on SCr of 0.72 mg/dL).   Medical History: Past Medical History:  Diagnosis Date  . Atrial flutter ([place])    a. s/p remote ablation by Dr. [name].  [name] CAD (coronary artery disease)    a. s/p CABG 2003. b. 01/2016: unstable angina - s/p BMS to SVG-ramus intermediate, patent LIMA-mLAD, patent RIMA-dRCA.   [name] CHF (congestive heart failure) ([place]) 11/2010   seen at [facility] then started lasix  . Chronic atrial fibrillation    a. initially post op CABG then chronic. On Coumadin.  . Chronic lower back pain   . Dysrhythmia 2000   4 or 5 cardioversions.  . Essential hypertension   . High cholesterol   . Ischemic cardiomyopathy    a. Cath 01/2016 -  EF 50-55% with mild distal inferior hypocontractility.  . Mild aortic valve stenosis    PER ECHO 10-23-2018 IN [facility]    . Unspecified hereditary and idiopathic peripheral neuropathy 04/04/2014    Medications:  PTA: Warfarin 7.5mg  daily except 3.75mg  on Mon & Fri- last dose 7/22  Assessment: 18 y/oM admitted for THA revision. On chronic warfarin for a-fib PTA. Warfarin was held for 5 days pre-op and thus INR at baseline, however per outpatient records he has been on current regimen with therapeutic INR for >1 year. Pharmacy consulted to resume warfarin post-operatively.   Today, 03/31/19: INR = 1.1 CBC: Hgb 10.2, Pltc WNL  No bleeding noted   Goal of Therapy:  INR 2-3   Plan:  Warfarin 7.5mg  PO x 1 today Daily PT/INR Monitor closely for s/sx of bleeding    Lindell Spar, PharmD, BCPS Clinical Pharmacist  03/31/2019,11:59 AM

## 2019-03-31 NOTE — Progress Notes (Signed)
Plan was originally for discharge tomorrow, however patient is feeling well and states he is ready to go home. Meeting goals with therapy.   Anticoagulation Plan (discussed with patient and Benedetto Goad, RN): 10 mg warfarin tonight 10 mg warfarin tomorrow (Wednesday) On Thursday he will return to normal dose of 7.5 mg and will schedule appt with coumadin clinic for INR draw.

## 2019-03-31 NOTE — Evaluation (Signed)
Physical Therapy Evaluation Patient Details Name: Leonard Cisneros MRN: [id number] DOB: 1940-12-05 Today's Date: 03/31/2019   History of Present Illness  Pt is a 78 year old male s/p Femoral revision right total hip arthroplasty on 03/30/19 with PMHx: CAD, CABG, chronic A-fib  Clinical Impression  Patient evaluated by Physical Therapy with no further acute PT needs identified. All education has been completed and the patient has no further questions.  Pt reports multiple hip surgeries and being familiar with precautions however educated on maintaining posterior hip precautions during mobility and exercises.  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  He states he has a script for AFO so recommend he f/u with this for safety as he compensates for decreased R DF during ambulation.  Pt provided with hip precaution handout and HEP.  Pt anticipates and eager for d/c home today. See below for any follow-up Physical Therapy or equipment needs. PT is signing off. Thank you for this referral.     Follow Up Recommendations Follow surgeon's recommendation for DC plan and follow-up therapies    Equipment Recommendations  None recommended by PT    Recommendations for Other Services       Precautions / Restrictions Precautions Precautions: Posterior Hip;Fall Precaution Comments: verbally reviewed and provided handout Restrictions Other Position/Activity Restrictions: WBAT      Mobility  Bed Mobility Overal bed mobility: Needs Assistance Bed Mobility: Supine to Sit     Supine to sit: Supervision     General bed mobility comments: cues for posterior hip precautions  Transfers Overall transfer level: Needs assistance Equipment used: Rolling walker (2 wheeled) Transfers: Sit to/from Stand Sit to Stand: Supervision         General transfer comment: cues for hip precautions  Ambulation/Gait Ambulation/Gait assistance: Supervision;Min guard Gait Distance (Feet):  320 Feet Assistive device: Rolling walker (2 wheeled) Gait Pattern/deviations: Step-to pattern;Decreased dorsiflexion - right;Decreased stance time - right;Antalgic     General Gait Details: verbal cues for sequence, RW positioning, observed increased hip/knee flexion on R LE to compensate for decreased active R DF  Stairs            Wheelchair Mobility    Modified Rankin (Stroke Patients Only)       Balance Overall balance assessment: (denies any recent falls)                                           Pertinent Vitals/Pain Pain Assessment: 0-10 Pain Score: 4  Pain Location: R hip Pain Descriptors / Indicators: Sore;Tightness Pain Intervention(s): Limited activity within patient's tolerance;Monitored during session;Repositioned;Ice applied    Home Living Family/patient expects to be discharged to:: Private residence Living Arrangements: Spouse/significant other Available Help at Discharge: Family Type of Home: House Home Access: Stairs to enter Entrance Stairs-Rails: Can reach both Entrance Stairs-Number of Steps: 4 Home Layout: One level Home Equipment: Environmental consultant - 2 wheels;Crutches      Prior Function Level of Independence: Independent with assistive device(s)               Hand Dominance        Extremity/Trunk Assessment        Lower Extremity Assessment Lower Extremity Assessment: RLE deficits/detail RLE Deficits / Details: presents with R foot drop (pt reports he has script for orthotic/AFO however never returned for f/u), grossly 3/5 hip strength observed within  precautions       Communication   Communication: No difficulties  Cognition Arousal/Alertness: Awake/alert Behavior During Therapy: WFL for tasks assessed/performed Overall Cognitive Status: Within Functional Limits for tasks assessed                                        General Comments      Exercises Total Joint Exercises Ankle  Circles/Pumps: AROM;Both;10 reps Quad Sets: AROM;Both;10 reps Heel Slides: Right;10 reps;AAROM(all exercises within precautions and explained to pt) Hip ABduction/ADduction: AROM;Right;10 reps Long Arc Quad: AROM;Right;10 reps   Assessment/Plan    PT Assessment All further PT needs can be met in the next venue of care  PT Problem List Decreased strength;Decreased range of motion;Decreased knowledge of use of DME;Decreased mobility;Decreased knowledge of precautions;Pain       PT Treatment Interventions      PT Goals (Current goals can be found in the Care Plan section)  Acute Rehab PT Goals PT Goal Formulation: All assessment and education complete, DC therapy    Frequency     Barriers to discharge        Co-evaluation               AM-PAC PT "6 Clicks" Mobility  Outcome Measure Help needed turning from your back to your side while in a flat bed without using bedrails?: None Help needed moving from lying on your back to sitting on the side of a flat bed without using bedrails?: None Help needed moving to and from a bed to a chair (including a wheelchair)?: A Little Help needed standing up from a chair using your arms (e.g., wheelchair or bedside chair)?: A Little Help needed to walk in hospital room?: A Little Help needed climbing 3-5 steps with a railing? : A Little 6 Click Score: 20    End of Session Equipment Utilized During Treatment: Gait belt Activity Tolerance: Patient tolerated treatment well Patient left: in chair;with call bell/phone within reach Nurse Communication: Mobility status PT Visit Diagnosis: Difficulty in walking, not elsewhere classified (R26.2)    Time: 3735-7897 PT Time Calculation (min) (ACUTE ONLY): 27 min   Charges:   PT Evaluation $PT Eval Low Complexity: 1 Low PT Treatments $Therapeutic Exercise: 8-22 mins   Carmelia Bake, PT, DPT Acute Rehabilitation Services Office: [phone] Pager: [phone]  Trena Platt 03/31/2019, 1:38 PM

## 2019-04-01 NOTE — Discharge Summary (Signed)
Physician Discharge Summary   Patient ID: Leonard Cisneros MRN: [id number] DOB/AGE: 1940/09/23 78 y.o.  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 Atrial flutter ([place])    a. s/p remote ablation by Dr. [name].  [name] CAD (coronary artery disease)    a. s/p CABG 2003. b. 01/2016: unstable angina - s/p BMS to SVG-ramus intermediate, patent LIMA-mLAD, patent RIMA-dRCA.   [name] CHF (congestive heart failure) ([place]) 11/2010   seen at MD then started lasix  . Chronic atrial fibrillation    a. initially post op CABG then chronic. On Coumadin.  . Chronic lower back pain   . Dysrhythmia 2000   4 or 5 cardioversions.  . Essential hypertension   . High cholesterol   . Ischemic cardiomyopathy    a. Cath 01/2016 -  EF 50-55% with mild distal inferior hypocontractility.  . Mild aortic valve stenosis    PER ECHO 10-23-2018 IN EPIC   . Unspecified hereditary and idiopathic peripheral neuropathy 04/04/2014   Discharge Diagnoses:   Principal Problem:   Failed total hip arthroplasty ([place])  Estimated body mass index is 33.2 kg/m as calculated from the following:   Height as of this encounter: 5\' 11"  (1.803 m).   Weight as of this encounter: 108 kg.  Procedure:  Procedure(s) (LRB): Femoral revision right total hip arthroplasty (Right)   Consults: None  HPI: The patient is a 78 year old male status post right total hip arthroplasty many years ago.  He had a femoral revision done over 10 years ago.  He did fine and over the past year has had increasing right thigh pain.  His lab and radiographic studies became equivocal.  Given his persistent pain and dysfunction, he presents now for femoral versus total hip arthroplasty revision.  Laboratory Data: Admission on 03/30/2019, Discharged on 03/31/2019  Component Date Value Ref Range Status  .  ABO/RH(D) 03/30/2019 O POS   Final  . Antibody Screen 03/30/2019 NEG   Final  . Sample Expiration 03/30/2019    Final                   Value:04/02/2019,2359 Performed at [facility], [place] [address], [place], [place] [address]   . Prothrombin Time 03/30/2019 14.7  11.4 - 15.2 seconds Final  . INR 03/30/2019 1.2  0.8 - 1.2 Final   Comment: (NOTE) INR goal varies based on device and disease states. Performed at [facility], [place] [address], [place], [place] [address]   . WBC 03/31/2019 10.0  4.0 - 10.5 K/uL Final  . RBC 03/31/2019 2.96* 4.22 - 5.81 MIL/uL Final  . Hemoglobin 03/31/2019 10.2* 13.0 - 17.0 g/dL Final  . HCT 03/31/2019 32.0* 39.0 - 52.0 % Final  . MCV 03/31/2019 108.1* 80.0 - 100.0 fL Final  . MCH 03/31/2019 34.5* 26.0 - 34.0 pg Final  . MCHC 03/31/2019 31.9  30.0 - 36.0 g/dL Final  . RDW 03/31/2019 13.3  11.5 - 15.5 % Final  . Platelets 03/31/2019 160  150 - 400 K/uL Final  . nRBC 03/31/2019 0.0  0.0 - 0.2 % Final   Performed at [facility], [place] [address], [place], [place] [address]  . Sodium 03/31/2019 137  135 - 145 mmol/L Final  . Potassium 03/31/2019 4.5  3.5 - 5.1 mmol/L Final  . Chloride 03/31/2019 109  98 -  111 mmol/L Final  . CO2 03/31/2019 22  22 - 32 mmol/L Final  . Glucose, Bld 03/31/2019 171* 70 - 99 mg/dL Final  . BUN 03/31/2019 14  8 - 23 mg/dL Final  . Creatinine, Ser 03/31/2019 0.72  0.61 - 1.24 mg/dL Final  . Calcium 03/31/2019 8.7* 8.9 - 10.3 mg/dL Final  . GFR calc non Af Amer 03/31/2019 >60  >60 mL/min Final  . GFR calc Af Amer 03/31/2019 >60  >60 mL/min Final  . Anion gap 03/31/2019 6  5 - 15 Final   Performed at [facility], [place] [address] [place], [place] [address]  . Prothrombin Time 03/31/2019 14.4  11.4 - 15.2 seconds Final  . INR 03/31/2019 1.1  0.8 - 1.2 Final   Comment: (NOTE) INR goal varies based on device and disease states. Performed at [facility], [place] [address], [place], [place] [address]   Hospital Outpatient Visit on 03/26/2019  Component Date Value Ref Range Status  . SARS Coronavirus 2 03/26/2019 NEGATIVE  NEGATIVE Final   Comment: (NOTE) SARS-CoV-2 target nucleic acids are NOT DETECTED. The SARS-CoV-2 RNA is generally detectable in upper and lower respiratory specimens during the acute phase of infection. Negative results do not preclude SARS-CoV-2 infection, do not rule out co-infections with other pathogens, and should not be used as the sole basis for treatment or other patient management decisions. Negative results must be combined with clinical observations, patient history, and epidemiological information. The expected result is Negative. Fact Sheet for Patients: SugarRoll.be Fact Sheet for Healthcare Providers: https://www.woods-mathews.com/ This test is not yet approved or cleared by the [place] FDA and  has been authorized for detection and/or diagnosis of SARS-CoV-2 by FDA under an Emergency Use Authorization (EUA). This EUA will remain  in effect (meaning this test can be used) for the duration of the COVID-19 declaration under Section 56                          4(b)(1) of the Act, 21 U.S.C. section 360bbb-3(b)(1), unless the authorization is terminated or revoked sooner. Performed at [facility], [place] [address], [place], [place] [address]   Hospital Outpatient Visit on 03/26/2019  Component Date Value Ref Range Status  . Sodium 03/26/2019 140  135 - 145 mmol/L Final  . Potassium 03/26/2019 4.4  3.5 - 5.1 mmol/L Final  . Chloride 03/26/2019 108  98 - 111 mmol/L Final  . CO2 03/26/2019 23  22 - 32 mmol/L Final  . Glucose, Bld 03/26/2019 106* 70 - 99 mg/dL Final  . BUN 03/26/2019 18  8 - 23 mg/dL Final  . Creatinine, Ser 03/26/2019 0.92  0.61 - 1.24 mg/dL Final  . Calcium 03/26/2019 9.3  8.9 - 10.3 mg/dL Final  . GFR calc non Af  Amer 03/26/2019 >60  >60 mL/min Final  . GFR calc Af Amer 03/26/2019 >60  >60 mL/min Final  . Anion gap 03/26/2019 9  5 - 15 Final   Performed at [facility], [place] [address], [place], [place] [address]  . WBC 03/26/2019 9.2  4.0 - 10.5 K/uL Final  . RBC 03/26/2019 3.41* 4.22 - 5.81 MIL/uL Final  . Hemoglobin 03/26/2019 11.7* 13.0 - 17.0 g/dL Final  . HCT 03/26/2019 36.6* 39.0 - 52.0 % Final  . MCV 03/26/2019 107.3* 80.0 - 100.0 fL Final  . MCH 03/26/2019 34.3* 26.0 - 34.0 pg Final  . MCHC 03/26/2019  32.0  30.0 - 36.0 g/dL Final  . RDW 03/26/2019 13.9  11.5 - 15.5 % Final  . Platelets 03/26/2019 227  150 - 400 K/uL Final  . nRBC 03/26/2019 0.0  0.0 - 0.2 % Final   Performed at [facility], [place] [address], [place], [place] [address]  . MRSA, PCR 03/26/2019 NEGATIVE  NEGATIVE Final  . Staphylococcus aureus 03/26/2019 NEGATIVE  NEGATIVE Final   Comment: (NOTE) The Xpert SA Assay (FDA approved for NASAL specimens in patients 44 years of age and older), is one component of a comprehensive surveillance program. It is not intended to diagnose infection nor to guide or monitor treatment. Performed at [facility], [place] [address], [place], [place] [address]   Admission on 03/18/2019, Discharged on 03/18/2019  Component Date Value Ref Range Status  . Prothrombin Time 03/18/2019 15.1  11.4 - 15.2 seconds Final  . INR 03/18/2019 1.2  0.8 - 1.2 Final   Comment: (NOTE) INR goal varies based on device and disease states. Performed at [facility], [place] [address] [place], [place] [address]   Hospital Outpatient Visit on 03/16/2019  Component Date Value Ref Range Status  . aPTT 03/16/2019 33  24 - 36 seconds Final   Performed at [facility], [place] [address] [place], [place] [address]  . WBC 03/16/2019 8.6  4.0 - 10.5 K/uL Final  . RBC 03/16/2019 3.35* 4.22 - 5.81 MIL/uL Final  . Hemoglobin  03/16/2019 11.8* 13.0 - 17.0 g/dL Final  . HCT 03/16/2019 36.0* 39.0 - 52.0 % Final  . MCV 03/16/2019 107.5* 80.0 - 100.0 fL Final  . MCH 03/16/2019 35.2* 26.0 - 34.0 pg Final  . MCHC 03/16/2019 32.8  30.0 - 36.0 g/dL Final  . RDW 03/16/2019 13.8  11.5 - 15.5 % Final  . Platelets 03/16/2019 203  150 - 400 K/uL Final  . nRBC 03/16/2019 0.0  0.0 - 0.2 % Final   Performed at [facility], [place] [address] [place], [place] [address]  . Sodium 03/16/2019 141  135 - 145 mmol/L Final  . Potassium 03/16/2019 4.6  3.5 - 5.1 mmol/L Final  . Chloride 03/16/2019 109  98 - 111 mmol/L Final  . CO2 03/16/2019 22  22 - 32 mmol/L Final  . Glucose, Bld 03/16/2019 117* 70 - 99 mg/dL Final  . BUN 03/16/2019 20  8 - 23 mg/dL Final  . Creatinine, Ser 03/16/2019 0.96  0.61 - 1.24 mg/dL Final  . Calcium 03/16/2019 9.2  8.9 - 10.3 mg/dL Final  . Total Protein 03/16/2019 7.3  6.5 - 8.1 g/dL Final  . Albumin 03/16/2019 4.4  3.5 - 5.0 g/dL Final  . AST 03/16/2019 29  15 - 41 U/L Final  . ALT 03/16/2019 33  0 - 44 U/L Final  . Alkaline Phosphatase 03/16/2019 127* 38 - 126 U/L Final  . Total Bilirubin 03/16/2019 1.2  0.3 - 1.2 mg/dL Final  . GFR calc non Af Amer 03/16/2019 >60  >60 mL/min Final  . GFR calc Af Amer 03/16/2019 >60  >60 mL/min Final  . Anion gap 03/16/2019 10  5 - 15 Final   Performed at [facility], [place] [address] [place], [place] [address]  . Prothrombin Time 03/16/2019 20.0* 11.4 - 15.2 seconds Final  . INR 03/16/2019 1.7* 0.8 - 1.2 Final   Comment: (NOTE) INR goal varies based on device and disease states. Performed at [facility], [address] [place], [place] [address]   . ABO/RH(D) 03/16/2019 O POS   Final  . Antibody Screen 03/16/2019 NEG   Final  . Sample Expiration 03/16/2019 03/21/2019,2359   Final  . Extend sample reason 03/16/2019    Final                   Value:NO TRANSFUSIONS OR PREGNANCY IN THE PAST 3 MONTHS  Performed at [facility], [place] [address], [place], [place] [address]   . MRSA, PCR 03/16/2019 NEGATIVE  NEGATIVE Final  . Staphylococcus aureus 03/16/2019 NEGATIVE  NEGATIVE Final   Comment: (NOTE) The Xpert SA Assay (FDA approved for NASAL specimens in patients 20 years of age and older), is one component of a comprehensive surveillance program. It is not intended to diagnose infection nor to guide or monitor treatment. Performed at [facility], [place] [address] [place], [place] [address]   Hospital Outpatient Visit on 03/14/2019  Component Date Value Ref Range Status  . SARS Coronavirus 2 03/14/2019 NEGATIVE  NEGATIVE Final   Comment: (NOTE) SARS-CoV-2 target nucleic acids are NOT DETECTED. The SARS-CoV-2 RNA is generally detectable in upper and lower respiratory specimens during the acute phase of infection. Negative results do not preclude SARS-CoV-2 infection, do not rule out co-infections with other pathogens, and should not be used as the sole basis for treatment or other patient management decisions. Negative results must be combined with clinical observations, patient history, and epidemiological information. The expected result is Negative. Fact Sheet for Patients: SugarRoll.be Fact Sheet for Healthcare Providers: https://www.woods-mathews.com/ This test is not yet approved or cleared by the [place] FDA and  has been authorized for detection and/or diagnosis of SARS-CoV-2 by FDA under an Emergency Use Authorization (EUA). This EUA will remain  in effect (meaning this test can be used) for the duration of the COVID-19 declaration under Section 56                          4(b)(1) of the Act, 21 U.S.C. section 360bbb-3(b)(1), unless the authorization is terminated or revoked sooner. Performed at [facility], [place] [address], [place], [place] [address]   Orders Only on 03/04/2019   Component Date Value Ref Range Status  . INR 03/04/2019 2.6* 0.8 - 1.2 Final   Comment: Reference interval is for non-anticoagulated patients. Suggested INR therapeutic range for Vitamin K antagonist therapy:    Standard Dose (moderate intensity                   therapeutic range):       2.0 - 3.0    Higher intensity therapeutic range       2.5 - 3.5   . Prothrombin Time 03/04/2019 26.1* 9.1 - 12.0 sec Final  Hospital Outpatient Visit on 02/19/2019  Component Date Value Ref Range Status  . SARS-CoV-2, NAA 02/19/2019 NOT DETECTED  NOT DETECTED Final   Comment: (NOTE) This test was developed and its performance characteristics determined by [employer]. This test has not been FDA cleared or approved. This test has been authorized by FDA under an Emergency Use Authorization (EUA). This test is only authorized for the duration of time the declaration that circumstances exist justifying the authorization of the emergency use of in vitro diagnostic tests for detection of SARS-CoV-2 virus and/or diagnosis of COVID-19 infection under section 564(b)(1) of the Act, 21 U.S.C. 967ELF-8(B)(0), unless the authorization is terminated or  revoked sooner. When diagnostic testing is negative, the possibility of a false negative result should be considered in the context of a patient's recent exposures and the presence of clinical signs and symptoms consistent with COVID-19. An individual without symptoms of COVID-19 and who is not shedding SARS-CoV-2 virus would expect to have a negative (not detected) result in this assay. Performed                           At: [facility] [place], [place] [id number] Rush Farmer MD [id number]   . Coronavirus Source 02/19/2019 NASOPHARYNGEAL   Final   Performed at [facility], [address], [place], [place] [address]  Hospital Outpatient Visit on 02/05/2019  Component Date Value Ref Range Status  . SARS-CoV-2, NAA  02/05/2019 NOT DETECTED  NOT DETECTED Final   Comment: (NOTE) This test was developed and its performance characteristics determined by [employer]. This test has not been FDA cleared or approved. This test has been authorized by FDA under an Emergency Use Authorization (EUA). This test is only authorized for the duration of time the declaration that circumstances exist justifying the authorization of the emergency use of in vitro diagnostic tests for detection of SARS-CoV-2 virus and/or diagnosis of COVID-19 infection under section 564(b)(1) of the Act, 21 U.S.C. 518ACZ-6(S)(0), unless the authorization is terminated or revoked sooner. When diagnostic testing is negative, the possibility of a false negative result should be considered in the context of a patient's recent exposures and the presence of clinical signs and symptoms consistent with COVID-19. An individual without symptoms of COVID-19 and who is not shedding SARS-CoV-2 virus would expect to have a negative (not detected) result in this assay. Performed                           At: [facility] [place], [place] [id number] Rush Farmer MD [id number]   . Coronavirus Source 02/05/2019 NASOPHARYNGEAL   Final   Performed at [facility], [address], [place], [place] [address]     X-Rays:Dg Pelvis Portable  Result Date: 03/30/2019 CLINICAL DATA:  Post RIGHT hip revision EXAM: PORTABLE PELVIS 1-2 VIEWS COMPARISON:  Portable exam 1818 hours compared to CT 04/21/2018 FINDINGS: BILATERAL hip prostheses identified, with interval revision on RIGHT. A radiopaque ring is seen surrounding the femoral head component, presumed to be part of the acetabular component, new since prior exam, uncertain if in expected position. No acute fracture or dislocation. Bones appear demineralized. Surgical drain overlies the operative site. IMPRESSION: Interval revision of RIGHT hip prosthesis. Note is made of a  radiopaque ring surrounding the femoral head component, presumed to be part of the acetabular component though uncertain as to its expected position, recommend correlation. Electronically Signed   By: Lavonia Dana M.D.   On: 03/30/2019 19:05    EKG: Orders placed or performed during the hospital encounter of 10/22/18  . ED EKG  . ED EKG  . EKG 12-Lead  . EKG 12-Lead  . EKG 12-Lead  . EKG 12-Lead  . EKG 12-Lead  . EKG 12-Lead  . EKG     Hospital Course: Leonard Cisneros is a 78 y.o. who was admitted to [facility]. They were brought to the operating room on 03/30/2019 and underwent Procedure(s): Femoral revision right total hip arthroplasty.  Patient tolerated the procedure well and was later transferred to the recovery  room and then to the orthopaedic floor for postoperative care. They were given PO and IV analgesics for pain control following their surgery. They were given 24 hours of postoperative antibiotics of  Anti-infectives (From admission, onward)   Start     Dose/Rate Route Frequency Ordered Stop   03/30/19 2130  ceFAZolin (ANCEF) IVPB 2g/100 mL premix     2 g 200 mL/hr over 30 Minutes Intravenous Every 6 hours 03/30/19 1936 03/31/19 0358   03/30/19 1315  ceFAZolin (ANCEF) IVPB 2g/100 mL premix     2 g 200 mL/hr over 30 Minutes Intravenous  Once 03/30/19 1300 03/30/19 1540     and started on DVT prophylaxis in the form of Coumadin.  PT and OT were ordered for total joint protocol. Discharge planning consulted to help with postop disposition and equipment needs. Patient had a good night on the evening of surgery. They started to get up OOB with therapy on POD #1. Pt was seen during rounds and was feeling well. Hemovac drain was pulled without difficulty. Original plan was for patient to discharge home on POD #2, however he was feeling well and wanted to go home. He was also meeting goals with therapy. Discussed case with RN, who felt patient was ready for discharge. He was  discharged home later that day in stable condition on POD #1.   Anticoagulation Regimen:  10 mg warfarin POD #1 10 mg warfarin POD #2 On Thursday (POD #3) he will return to normal dose of 7.5 mg and will schedule appt with coumadin clinic for INR draw.  Diet: Cardiac diet Activity: WBAT Follow-up: in 2 weeks Disposition: Home with HEP Discharged Condition: stable   Discharge Instructions    Call MD / Call 911   Complete by: As directed    If you experience chest pain or shortness of breath, CALL 911 and be transported to the hospital emergency room.  If you develope a fever above 101 F, pus (white drainage) or increased drainage or redness at the wound, or calf pain, call your surgeon's office.   Change dressing   Complete by: As directed    Change the dressing daily with sterile 4 x 4 inch gauze dressing and paper tape.   Constipation Prevention   Complete by: As directed    Drink plenty of fluids.  Prune juice may be helpful.  You may use a stool softener, such as Colace (over the counter) 100 mg twice a day.  Use MiraLax (over the counter) for constipation as needed.   Diet - low sodium heart healthy   Complete by: As directed    Driving restrictions   Complete by: As directed    No driving for 2 weeks   Follow the hip precautions as taught in Physical Therapy   Complete by: As directed    Weight bearing as tolerated   Complete by: As directed      Allergies as of 03/31/2019      Reactions   Augmentin [amoxicillin-pot Clavulanate] Nausea And Vomiting, Other (See Comments)   Has patient had a PCN reaction causing immediate rash, facial/tongue/throat swelling, SOB or lightheadedness with hypotension: Unknown Has patient had a PCN reaction causing severe rash involving mucus membranes or skin necrosis: No Has patient had a PCN reaction that required hospitalization: No Has patient had a PCN reaction occurring within the last 10 years: No If all of the above answers are "NO",  then may proceed with Cephalosporin use.  Medication List    STOP taking these medications   aspirin EC 81 MG tablet     TAKE these medications   atorvastatin 80 MG tablet Commonly known as: LIPITOR Take 1 tablet (80 mg total) by mouth daily for 30 days.   diltiazem 240 MG 24 hr capsule Commonly known as: DILACOR XR Take 1 capsule (240 mg total) by mouth daily.   furosemide 40 MG tablet Commonly known as: LASIX Take one daily   HYDROcodone-acetaminophen 5-325 MG tablet Commonly known as: NORCO/VICODIN Take 1-2 tablets by mouth every 6 (six) hours as needed for severe pain.   isosorbide mononitrate 30 MG 24 hr tablet Commonly known as: IMDUR TAKE 1 TABLET BY MOUTH EVERY DAY   methocarbamol 500 MG tablet Commonly known as: ROBAXIN Take 1 tablet (500 mg total) by mouth every 6 (six) hours as needed for muscle spasms.   metoprolol succinate 50 MG 24 hr tablet Commonly known as: TOPROL-XL TAKE 1 & 1/2 TABLETS BY MOUTH EVERY DAY   nitroGLYCERIN 0.4 MG SL tablet Commonly known as: NITROSTAT DISSOLVE 1 TABLET UNDER THE TONGUE EVERY 5 MINUTES UP TO 15 MINUTES FOR CHEST PAIN What changed:   how much to take  how to take this  when to take this  reasons to take this   potassium chloride 10 MEQ tablet Commonly known as: K-DUR Take one every other day   ramipril 2.5 MG capsule Commonly known as: ALTACE TAKE ONE CAPSULE BY MOUTH EVERY DAY   traMADol 50 MG tablet Commonly known as: ULTRAM Take 1-2 tablets (50-100 mg total) by mouth every 6 (six) hours as needed for moderate pain.   warfarin 7.5 MG tablet Commonly known as: COUMADIN Take as directed. If you are unsure how to take this medication, talk to your nurse or doctor. Original instructions: Take 1/2 to 1 tablet daily as directed by coumadin clinic What changed:   how much to take  how to take this  when to take this  additional instructions            Discharge Care Instructions  (From  admission, onward)         Start     Ordered   03/31/19 0000  Weight bearing as tolerated     03/31/19 0712   03/31/19 0000  Change dressing    Comments: Change the dressing daily with sterile 4 x 4 inch gauze dressing and paper tape.   03/31/19 3545         Follow-up Information    [name], MD. Schedule an appointment as soon as possible for a visit on 04/14/2019.   Specialty: Orthopedic Surgery Contact information: [address] [place] [address] [phone]           Signed: Theresa Duty, PA-C Orthopedic Surgery 04/01/2019, 9:41 AM

## 2019-04-02 ENCOUNTER — Other Ambulatory Visit: Payer: Self-pay | Admitting: Cardiovascular Disease

## 2019-04-02 DIAGNOSIS — Z7901 Long term (current) use of anticoagulants: Secondary | ICD-10-CM | POA: Diagnosis not present

## 2019-04-02 LAB — COAGUCHEK XS/INR WAIVED
INR: 2 — ABNORMAL HIGH (ref 0.9–1.1)
Prothrombin Time: 23.9 s

## 2019-04-06 ENCOUNTER — Ambulatory Visit (INDEPENDENT_AMBULATORY_CARE_PROVIDER_SITE_OTHER): Payer: Medicare HMO | Admitting: Pharmacist

## 2019-04-06 DIAGNOSIS — I482 Chronic atrial fibrillation, unspecified: Secondary | ICD-10-CM

## 2019-04-06 DIAGNOSIS — Z7901 Long term (current) use of anticoagulants: Secondary | ICD-10-CM

## 2019-04-08 ENCOUNTER — Other Ambulatory Visit: Payer: Self-pay

## 2019-04-08 ENCOUNTER — Emergency Department (HOSPITAL_BASED_OUTPATIENT_CLINIC_OR_DEPARTMENT_OTHER): Payer: Medicare HMO

## 2019-04-08 ENCOUNTER — Emergency Department (HOSPITAL_COMMUNITY): Payer: Medicare HMO

## 2019-04-08 ENCOUNTER — Encounter (HOSPITAL_COMMUNITY): Payer: Self-pay

## 2019-04-08 ENCOUNTER — Inpatient Hospital Stay (HOSPITAL_COMMUNITY)
Admission: EM | Admit: 2019-04-08 | Discharge: 2019-04-13 | DRG: 308 | Disposition: A | Discharge status: Home / Self Care | Payer: Medicare HMO | Attending: Internal Medicine | Admitting: Internal Medicine

## 2019-04-08 DIAGNOSIS — R0602 Shortness of breath: Secondary | ICD-10-CM | POA: Diagnosis not present

## 2019-04-08 DIAGNOSIS — D539 Nutritional anemia, unspecified: Secondary | ICD-10-CM | POA: Diagnosis present

## 2019-04-08 DIAGNOSIS — R069 Unspecified abnormalities of breathing: Secondary | ICD-10-CM | POA: Diagnosis not present

## 2019-04-08 DIAGNOSIS — Z96643 Presence of artificial hip joint, bilateral: Secondary | ICD-10-CM | POA: Diagnosis present

## 2019-04-08 DIAGNOSIS — Z79899 Other long term (current) drug therapy: Secondary | ICD-10-CM

## 2019-04-08 DIAGNOSIS — Z87891 Personal history of nicotine dependence: Secondary | ICD-10-CM

## 2019-04-08 DIAGNOSIS — R609 Edema, unspecified: Secondary | ICD-10-CM | POA: Diagnosis not present

## 2019-04-08 DIAGNOSIS — I482 Chronic atrial fibrillation, unspecified: Secondary | ICD-10-CM | POA: Diagnosis not present

## 2019-04-08 DIAGNOSIS — I4891 Unspecified atrial fibrillation: Secondary | ICD-10-CM

## 2019-04-08 DIAGNOSIS — I110 Hypertensive heart disease with heart failure: Secondary | ICD-10-CM | POA: Diagnosis present

## 2019-04-08 DIAGNOSIS — I2510 Atherosclerotic heart disease of native coronary artery without angina pectoris: Secondary | ICD-10-CM

## 2019-04-08 DIAGNOSIS — I4821 Permanent atrial fibrillation: Secondary | ICD-10-CM

## 2019-04-08 DIAGNOSIS — Z20828 Contact with and (suspected) exposure to other viral communicable diseases: Secondary | ICD-10-CM | POA: Diagnosis present

## 2019-04-08 DIAGNOSIS — I5033 Acute on chronic diastolic (congestive) heart failure: Secondary | ICD-10-CM | POA: Diagnosis present

## 2019-04-08 DIAGNOSIS — I5021 Acute systolic (congestive) heart failure: Secondary | ICD-10-CM | POA: Diagnosis not present

## 2019-04-08 DIAGNOSIS — R0902 Hypoxemia: Secondary | ICD-10-CM | POA: Diagnosis not present

## 2019-04-08 DIAGNOSIS — G8929 Other chronic pain: Secondary | ICD-10-CM | POA: Diagnosis present

## 2019-04-08 DIAGNOSIS — D62 Acute posthemorrhagic anemia: Secondary | ICD-10-CM | POA: Diagnosis not present

## 2019-04-08 DIAGNOSIS — R0989 Other specified symptoms and signs involving the circulatory and respiratory systems: Secondary | ICD-10-CM | POA: Diagnosis not present

## 2019-04-08 DIAGNOSIS — Z209 Contact with and (suspected) exposure to unspecified communicable disease: Secondary | ICD-10-CM | POA: Diagnosis not present

## 2019-04-08 DIAGNOSIS — M545 Low back pain: Secondary | ICD-10-CM | POA: Diagnosis present

## 2019-04-08 DIAGNOSIS — I350 Nonrheumatic aortic (valve) stenosis: Secondary | ICD-10-CM | POA: Diagnosis present

## 2019-04-08 DIAGNOSIS — Z951 Presence of aortocoronary bypass graft: Secondary | ICD-10-CM

## 2019-04-08 DIAGNOSIS — Z7901 Long term (current) use of anticoagulants: Secondary | ICD-10-CM

## 2019-04-08 DIAGNOSIS — Z9861 Coronary angioplasty status: Secondary | ICD-10-CM

## 2019-04-08 DIAGNOSIS — E876 Hypokalemia: Secondary | ICD-10-CM | POA: Diagnosis present

## 2019-04-08 DIAGNOSIS — Z79891 Long term (current) use of opiate analgesic: Secondary | ICD-10-CM

## 2019-04-08 DIAGNOSIS — E785 Hyperlipidemia, unspecified: Secondary | ICD-10-CM | POA: Diagnosis present

## 2019-04-08 DIAGNOSIS — I4819 Other persistent atrial fibrillation: Secondary | ICD-10-CM | POA: Diagnosis present

## 2019-04-08 LAB — BASIC METABOLIC PANEL
Anion gap: 12 (ref 5–15)
BUN: 18 mg/dL (ref 8–23)
CO2: 22 mmol/L (ref 22–32)
Calcium: 8.7 mg/dL — ABNORMAL LOW (ref 8.9–10.3)
Chloride: 102 mmol/L (ref 98–111)
Creatinine, Ser: 0.77 mg/dL (ref 0.61–1.24)
GFR calc Af Amer: >60 mL/min (ref >60)
GFR calc non Af Amer: >60 mL/min (ref >60)
Glucose, Bld: 138 mg/dL — ABNORMAL HIGH (ref 70–99)
Potassium: 4.3 mmol/L (ref 3.5–5.1)
Sodium: 136 mmol/L (ref 135–145)

## 2019-04-08 LAB — CBC WITH DIFFERENTIAL/PLATELET
Abs Immature Granulocytes: 0.65 10*3/uL — ABNORMAL HIGH (ref 0.00–0.07)
Basophils Absolute: 0.1 10*3/uL (ref 0.0–0.1)
Basophils Relative: 0 %
Eosinophils Absolute: 0.3 10*3/uL (ref 0.0–0.5)
Eosinophils Relative: 2 %
HCT: 28.1 % — ABNORMAL LOW (ref 39.0–52.0)
Hemoglobin: 9 g/dL — ABNORMAL LOW (ref 13.0–17.0)
Immature Granulocytes: 4 %
Lymphocytes Relative: 9 %
Lymphs Abs: 1.4 10*3/uL (ref 0.7–4.0)
MCH: 35 pg — ABNORMAL HIGH (ref 26.0–34.0)
MCHC: 32 g/dL (ref 30.0–36.0)
MCV: 109.3 fL — ABNORMAL HIGH (ref 80.0–100.0)
Monocytes Absolute: 2 10*3/uL — ABNORMAL HIGH (ref 0.1–1.0)
Monocytes Relative: 13 %
Neutro Abs: 11.4 10*3/uL — ABNORMAL HIGH (ref 1.7–7.7)
Neutrophils Relative %: 72 %
Platelets: 251 10*3/uL (ref 150–400)
RBC: 2.57 MIL/uL — ABNORMAL LOW (ref 4.22–5.81)
RDW: 15.1 % (ref 11.5–15.5)
WBC: 15.8 10*3/uL — ABNORMAL HIGH (ref 4.0–10.5)
nRBC: 0.2 % (ref 0.0–0.2)

## 2019-04-08 LAB — PROTIME-INR
INR: 1.9 — ABNORMAL HIGH (ref 0.8–1.2)
Prothrombin Time: 21.2 seconds — ABNORMAL HIGH (ref 11.4–15.2)

## 2019-04-08 LAB — MRSA PCR SCREENING: MRSA by PCR: NEGATIVE

## 2019-04-08 LAB — BRAIN NATRIURETIC PEPTIDE: B Natriuretic Peptide: 113.3 pg/mL — ABNORMAL HIGH (ref 0.0–100.0)

## 2019-04-08 LAB — SARS CORONAVIRUS 2 (TAT 6-24 HRS): SARS Coronavirus 2: NEGATIVE

## 2019-04-08 LAB — TROPONIN I (HIGH SENSITIVITY): Troponin I (High Sensitivity): 10 ng/L (ref <18)

## 2019-04-08 MED ORDER — SODIUM CHLORIDE (PF) 0.9 % IJ SOLN
INTRAMUSCULAR | Status: AC
  Administered 2019-04-08: 09:00:00
  Filled 2019-04-08: qty 50

## 2019-04-08 MED ORDER — ATORVASTATIN CALCIUM 40 MG PO TABS
80.0000 mg | ORAL_TABLET | Freq: Every day | ORAL | Status: DC
  Administered 2019-04-09 – 2019-04-13 (×5): 80 mg via ORAL
  Filled 2019-04-08 (×2): qty 2
  Filled 2019-04-08: qty 1
  Filled 2019-04-08 (×2): qty 2
  Filled 2019-04-08: qty 1
  Filled 2019-04-08: qty 2

## 2019-04-08 MED ORDER — DILTIAZEM HCL 100 MG IV SOLR
5.0000 mg/h | INTRAVENOUS | Status: DC
  Administered 2019-04-08: 16:00:00 10 mg/h via INTRAVENOUS
  Administered 2019-04-08: 5 mg/h via INTRAVENOUS
  Administered 2019-04-09: 01:00:00 15 mg/h via INTRAVENOUS
  Filled 2019-04-08 (×4): qty 100

## 2019-04-08 MED ORDER — FUROSEMIDE 10 MG/ML IJ SOLN
40.0000 mg | Freq: Once | INTRAMUSCULAR | Status: AC
  Administered 2019-04-08: 40 mg via INTRAVENOUS
  Filled 2019-04-08: qty 4

## 2019-04-08 MED ORDER — IOHEXOL 350 MG/ML SOLN
100.0000 mL | Freq: Once | INTRAVENOUS | Status: AC | PRN
  Administered 2019-04-08: 100 mL via INTRAVENOUS

## 2019-04-08 MED ORDER — FUROSEMIDE 10 MG/ML IJ SOLN
40.0000 mg | Freq: Two times a day (BID) | INTRAMUSCULAR | Status: DC
  Administered 2019-04-09 – 2019-04-13 (×10): 40 mg via INTRAVENOUS
  Filled 2019-04-08 (×10): qty 4

## 2019-04-08 MED ORDER — HYDROCODONE-ACETAMINOPHEN 5-325 MG PO TABS
1.0000 | ORAL_TABLET | Freq: Four times a day (QID) | ORAL | Status: DC | PRN
  Administered 2019-04-09 – 2019-04-11 (×8): 2 via ORAL
  Administered 2019-04-11 – 2019-04-13 (×5): 1 via ORAL
  Filled 2019-04-08 (×2): qty 1
  Filled 2019-04-08 (×2): qty 2
  Filled 2019-04-08 (×2): qty 1
  Filled 2019-04-08: qty 2
  Filled 2019-04-08 (×2): qty 1
  Filled 2019-04-08 (×4): qty 2
  Filled 2019-04-08: qty 1

## 2019-04-08 MED ORDER — WARFARIN - PHARMACIST DOSING INPATIENT: Freq: Every day | Status: DC

## 2019-04-08 MED ORDER — WARFARIN SODIUM 7.5 MG PO TABS
7.5000 mg | ORAL_TABLET | Freq: Once | ORAL | Status: AC
  Administered 2019-04-08: 19:00:00 7.5 mg via ORAL
  Filled 2019-04-08: qty 1

## 2019-04-08 NOTE — ED Triage Notes (Signed)
Pt had hip replacement surgery on the 27th, hasn't had any issues at all, yesterday he noticed his whole right leg was swollen and then an hour ago he started having shortness of breath Pt has has hx of a-fib and takes warfarin

## 2019-04-08 NOTE — H&P (Addendum)
History and Physical  Leonard Cisneros:[id number] DOB: 1940-10-27 DOA: 04/08/2019  Referring physician: EDP PCP: [name], MD   Chief Complaint: sob, tachycardia, right leg edema  HPI: Leonard Cisneros Leonard Cisneros is a 78 y.o. male   H/o CABG,  Afib on coumadin, dCHF who was recently underwent right hip surgery by Dr [name] on 7/27. Returned to the ED due to above complaints. . He reports right thigh become swollen in the last 24-48hrs, he denies pain at rest, does has right sided pain with activity. She also reports feeling sob and fast heart rate. He denies fever, no chest pain. He denies orthopnea.   ED course: he is in afib/rvr, tmax 99.3, bp stable, no hypoxia, CTA negative for PE, right lower extremity US negative for DVT.  Wbc 15.8, hgb 9, cr 0.77, bnp 113, high sensitivity troponin is 10, INR 1.9. He is started on cardizem drip, hospitalist called due to patient is in afib/rvr needing cardizem drip.  Patient reports to me, he has been having bilateral lower extremity edema for a few months, some days are worse than others. He was in the hospital in 10/2018 for heart failure and  is started on lasix 40mg  daily. He reports does not use extra salt, he drink 6-8 glasses of water daily.  Review of Systems:  Detail per HPI, Review of systems are otherwise negative  Past Medical History:  Diagnosis Date   Atrial flutter ([place])    a. s/p remote ablation by Dr. [name].   CAD (coronary artery disease)    a. s/p CABG 2003. b. 01/2016: unstable angina - s/p BMS to SVG-ramus intermediate, patent LIMA-mLAD, patent RIMA-dRCA.    CHF (congestive heart failure) ([place]) 11/2010   seen at [facility] then started lasix   Chronic atrial fibrillation    a. initially post op CABG then chronic. On Coumadin.   Chronic lower back pain    Dysrhythmia 2000   4 or 5 cardioversions.   Essential hypertension    High cholesterol    Ischemic cardiomyopathy    a. Cath 01/2016 -  EF 50-55% with mild distal  inferior hypocontractility.   Mild aortic valve stenosis    PER ECHO 10-23-2018 IN [facility]    Unspecified hereditary and idiopathic peripheral neuropathy 04/04/2014   Past Surgical History:  Procedure Laterality Date   BACK SURGERY     CARDIAC CATHETERIZATION  ~ 2000; 2003   CARDIAC CATHETERIZATION N/A 02/01/2016   Procedure: Left Heart Cath and Cors/Grafts Angiography;  Surgeon: [name], MD;  Location: [place] [facility];  Service: Cardiovascular;  Laterality: N/A;   CARDIAC CATHETERIZATION N/A 02/01/2016   Procedure: Coronary Stent Intervention;  Surgeon: [name], MD;  Location: [place] [facility];  Service: Cardiovascular;  Laterality: N/A;   CATARACT EXTRACTION W/PHACO Left 02/09/2019   Procedure: CATARACT EXTRACTION PHACO AND INTRAOCULAR LENS PLACEMENT (IOC);  Surgeon: [name], MD;  Location: [facility];  Service: Ophthalmology;  Laterality: Left;  CDE: 27.70   CATARACT EXTRACTION W/PHACO Right 02/23/2019   Procedure: CATARACT EXTRACTION PHACO AND INTRAOCULAR LENS PLACEMENT RIGHT EYE;  Surgeon: [name], MD;  Location: [facility];  Service: Ophthalmology;  Laterality: Right;  CDE: 10.15   COLONOSCOPY  06/04/2002   Normal colon and rectum   COLONOSCOPY  07/08/2012   Procedure: COLONOSCOPY;  Surgeon: [name], MD;  Location: [facility];  Service: Endoscopy;  Laterality: N/A;  11:30   COLONOSCOPY N/A 10/02/2017   Procedure: COLONOSCOPY;  Surgeon:  Rourk, Cristopher Estimable, MD;  Location: [facility];  Service: Endoscopy;  Laterality: N/A;  10:30am   CORONARY ANGIOPLASTY WITH STENT PLACEMENT  02/01/2016   CORONARY ARTERY BYPASS GRAFT  2003   LIMA to LAD,LIMA to distal RCA,SVG to ramus intermediate vessel.   FEMORAL REVISION Right 03/30/2019   Procedure: Femoral revision right total hip arthroplasty;  Surgeon: [name], MD;  Location: [facility];  Service: Orthopedics;  Laterality: Right;  137min   FOREIGN BODY REMOVAL Right 04/02/2014   Procedure: FOREIGN BODY  REMOVAL RIGHT FOOT;  Surgeon: [name], MD;  Location: [facility];  Service: General;  Laterality: Right;   JOINT REPLACEMENT     JOINT REPLACEMENT     LUMBAR Wall SURGERY     REVISION TOTAL HIP ARTHROPLASTY Bilateral 2005-2012   right-left   SHOULDER OPEN ROTATOR CUFF REPAIR Right    TOTAL HIP ARTHROPLASTY Right 1999   TOTAL HIP ARTHROPLASTY Left 2008   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   Social History:  reports that he quit smoking about 52 years ago. His smoking use included cigarettes. He has a 1.00 pack-year smoking history. He has never used smokeless tobacco. He reports current alcohol use of about 11.0 standard drinks of alcohol per week. He reports that he does not use drugs. Patient lives at home & is able to participate in activities of daily living independently   Allergies  Allergen Reactions   Augmentin [Amoxicillin-Pot Clavulanate] Nausea And Vomiting and Other (See Comments)    Has patient had a PCN reaction causing immediate rash, facial/tongue/throat swelling, SOB or lightheadedness with hypotension: Unknown Has patient had a PCN reaction causing severe rash involving mucus membranes or skin necrosis: No Has patient had a PCN reaction that required hospitalization: No Has patient had a PCN reaction occurring within the last 10 years: No If all of the above answers are "NO", then may proceed with Cephalosporin use.     Family History  Problem Relation Age of Onset   Heart attack Brother    Colon cancer Neg Hx       Prior to Admission medications   Medication Sig Start Date End Date Taking? Authorizing Provider  atorvastatin (LIPITOR) 80 MG tablet Take 1 tablet (80 mg total) by mouth daily for 30 days. 01/20/19 04/08/19 Yes [name], MD  diltiazem (DILACOR  XR) 240 MG 24 hr capsule Take 1 capsule (240 mg total) by mouth daily. 12/25/18  Yes [name], MD  furosemide (LASIX) 40 MG tablet Take one daily Patient taking differently: Take 40 mg by mouth daily.  02/02/19  Yes [name], MD  HYDROcodone-acetaminophen (NORCO/VICODIN) 5-325 MG tablet Take 1-2 tablets by mouth every 6 (six) hours as needed for severe pain. 03/31/19  Yes Edmisten, Kristie L, PA  isosorbide mononitrate (IMDUR) 30 MG 24 hr tablet TAKE 1 TABLET BY MOUTH EVERY DAY Patient taking differently: Take 30 mg by mouth daily.  11/10/18  Yes [name], MD  methocarbamol (ROBAXIN) 500 MG tablet Take 1 tablet (500 mg total) by mouth every 6 (six) hours as needed for muscle spasms. 03/31/19  Yes Edmisten, Kristie L, PA  metoprolol succinate (TOPROL-XL) 50 MG 24 hr tablet TAKE 1 & 1/2 TABLETS BY MOUTH EVERY DAY Patient taking differently: Take 75 mg by mouth daily.  06/02/18  Yes [name], MD  nitroGLYCERIN (NITROSTAT) 0.4 MG SL tablet DISSOLVE 1 TABLET UNDER THE TONGUE EVERY 5 MINUTES UP TO 15 MINUTES FOR CHEST PAIN Patient taking differently: Place 0.4 mg under the tongue every 5 (five) minutes as needed for chest pain. DISSOLVE 1 TABLET UNDER THE TONGUE EVERY 5 MINUTES UP TO 15 MINUTES FOR CHEST PAIN 01/29/18  Yes [name], MD  potassium chloride (K-DUR) 10 MEQ tablet Take one every other day Patient taking differently: Take 10 mEq by mouth every other day.  02/02/19  Yes [name], MD  ramipril (ALTACE) 2.5 MG capsule TAKE ONE CAPSULE BY MOUTH EVERY DAY Patient taking differently: Take 2.5 mg by mouth daily.  01/28/19  Yes [name], MD  traMADol (ULTRAM) 50 MG tablet Take 1-2 tablets (50-100 mg total) by mouth every 6 (six) hours as needed for moderate pain. 03/31/19  Yes Edmisten, Kristie L, PA  warfarin (COUMADIN) 7.5 MG tablet Take 1/2 to 1 tablet daily as directed by coumadin clinic Patient taking differently: Take 3.25-7.5 mg by mouth See admin  instructions. Take 3.25 mg by mouth on Mon and Fri and then take 7.5 mg by mouth on all other days (Tues, Wed, Thurs, Sat, Sun) 01/20/19  Yes [name], MD    Physical Exam: BP 131/73 (BP Location: Right Arm)    Pulse (!) 108    Temp 99.2 F (37.3 C) (Oral)    Resp 20    Ht 5\' 11"  (1.803 m)    Wt 108 kg    SpO2 96%    BMI 33.20 kg/m   General:  NAD Eyes: PERRL ENT: unremarkable Neck: supple, no JVD Cardiovascular: IRRR Respiratory: CTABL Abdomen: soft/ND/ND, positive bowel sounds Skin: no rash Musculoskeletal:  Right leg/thigh edema, left lower extremity mild pitting edema as well Psychiatric: calm/cooperative Neurologic: no focal findings            Labs on Admission:  Basic Metabolic Panel: Recent Labs  Lab 04/08/19 0543  NA 136  K 4.3  CL 102  CO2 22  GLUCOSE 138*  BUN 18  CREATININE 0.77  CALCIUM 8.7*   Liver Function Tests: No results for input(s): AST, ALT, ALKPHOS, BILITOT, PROT, ALBUMIN in the last 168 hours. No results for input(s): LIPASE, AMYLASE in the last 168 hours. No results for input(s): AMMONIA in the last 168 hours. CBC: Recent Labs  Lab 04/08/19 0543  WBC 15.8*  NEUTROABS 11.4*  HGB 9.0*  HCT 28.1*  MCV 109.3*  PLT 251   Cardiac Enzymes: No results for input(s): CKTOTAL, CKMB, CKMBINDEX, TROPONINI in the last 168 hours.  BNP (last 3 results) Recent Labs    09/30/18 1132 10/23/18 0030 04/08/19 0543  BNP 123.0* 104.0* 113.3*    ProBNP (last 3 results) No results for input(s): PROBNP in the last 8760 hours.  CBG: No results for input(s): GLUCAP in the last 168 hours.  Radiological Exams on Admission: Ct Angio Chest Pe W And/or Wo Contrast  Result Date: 04/08/2019 CLINICAL DATA:  Shortness of breath, right leg swelling EXAM: CT ANGIOGRAPHY CHEST WITH CONTRAST TECHNIQUE: Multidetector CT imaging of the chest was performed using the standard protocol during bolus administration of intravenous contrast. Multiplanar CT image  reconstructions and MIPs were obtained to evaluate the vascular anatomy. CONTRAST:  168mL OMNIPAQUE IOHEXOL 350 MG/ML SOLN COMPARISON:  Same day chest x-ray FINDINGS: Cardiovascular: Postoperative changes from CABG. Cardiomegaly. No pericardial effusion. The central pulmonary vasculature is adequately opacified. Respiratory motion  degrades evaluation of the distal pulmonary arterial tree. No filling defect to the level of the lobar branch pulmonary arteries. Mediastinum/Nodes: Mildly prominent mediastinal and right hilar lymph nodes including a precarinal node measuring 10 mm short axis. Unremarkable thyroid gland. Trachea and esophagus within normal limits. Lungs/Pleura: Lungs are clear without focal airspace consolidation, pleural effusion, or pneumothorax. Upper Abdomen: No acute abnormality. Musculoskeletal: No chest wall abnormality. No acute or significant osseous findings. Review of the MIP images confirms the above findings. IMPRESSION: 1. Negative for pulmonary embolism to the lobar branch level. Respiratory motion degrades evaluation of the more distal pulmonary arterial tree. 2. Lungs are clear. 3. Borderline prominent mediastinal and right hilar lymph nodes, nonspecific. 4. Cardiomegaly. Electronically Signed   By: Davina Poke M.D.   On: 04/08/2019 09:30   Dg Chest Portable 1 View  Result Date: 04/08/2019 CLINICAL DATA:  Shortness of breath EXAM: PORTABLE CHEST 1 VIEW COMPARISON:  10/22/2018 FINDINGS: Cardiomegaly with mild vascular prominence centrally. There has been CABG. No edema, consolidation, effusion, or pneumothorax. IMPRESSION: Cardiomegaly and mild vascular congestion. Electronically Signed   By: Monte Fantasia M.D.   On: 04/08/2019 06:38   Vas [place] Lower Extremity Venous (dvt) (only [facility])  Result Date: 04/08/2019  Lower Venous Study Indications: Edema.  Limitations: Body habitus and poor ultrasound/tissue interface. Comparison Study: no prior Performing Technologist: Abram Sander  RVS  Examination Guidelines: A complete evaluation includes B-mode imaging, spectral Doppler, color Doppler, and power Doppler as needed of all accessible portions of each vessel. Bilateral testing is considered an integral part of a complete examination. Limited examinations for reoccurring indications may be performed as noted.  +---------+---------------+---------+-----------+----------+--------------+  RIGHT     Compressibility Phasicity Spontaneity Properties Summary         +---------+---------------+---------+-----------+----------+--------------+  CFV       Full            Yes       Yes                                    +---------+---------------+---------+-----------+----------+--------------+  SFJ       Full                                                             +---------+---------------+---------+-----------+----------+--------------+  FV Prox   Full                                                             +---------+---------------+---------+-----------+----------+--------------+  FV Mid                    Yes       Yes                                    +---------+---------------+---------+-----------+----------+--------------+  FV Distal                 Yes       Yes                                    +---------+---------------+---------+-----------+----------+--------------+  PFV       Full                                                             +---------+---------------+---------+-----------+----------+--------------+  POP       Full            Yes       Yes                                    +---------+---------------+---------+-----------+----------+--------------+  PTV       Full                                                             +---------+---------------+---------+-----------+----------+--------------+  PERO                                                       Not visualized  +---------+---------------+---------+-----------+----------+--------------+    +----+---------------+---------+-----------+----------+-------+  LEFT Compressibility Phasicity Spontaneity Properties Summary  +----+---------------+---------+-----------+----------+-------+  CFV  Full            Yes       Yes                             +----+---------------+---------+-----------+----------+-------+     Summary: Right: There is no evidence of deep vein thrombosis in the lower extremity. However, portions of this examination were limited- see technologist comments above. No cystic structure found in the popliteal fossa. Left: No evidence of common femoral vein obstruction.  *See table(s) above for measurements and observations.    Preliminary     EKG: Independently reviewed. afib/RVR  Assessment/Plan Present on Admission:  A-fib ([place])     Afib/RVR -currently on cardizem drip, continue coumadin (INR1.9 )  -hold oral betablocker/ccb while on cardizem drip -observation in stepdown unit  -cardiology consulted  Acute on chronic diastolic CHF cxr "Cardiomegaly and mild vascular congestion.", mild volume overload on exam Iv lasix  Fluids restriction  -cardiology consult  Leukocytosis No fever, from recent surgery? Check ua, Monitor  Right leg/thigh  edema Negative for DVT Acute bleeding into right thigh? He denies pain at rest, no ecchymosis on exam, Monitor h/h Ortho Dr [name] consulted  Macrocytic Anemia  hgb around 11 prior to surgery, hgb 10 on 7/28, hgb today is 9 mcv chronically elevated but appear progressive from 102 to 109 Will check b12/folate/tsh  CAD s/p CABG No chest pain, tropnonin negative Continue home meds asa/statin/bb/imdur   SARS Cov2 screening in process, low suspicion for covid  DVT prophylaxis: coumadin   Consultants:  [facility] Dr Wynelle Link  cardiology  Code Status: full   Family Communication:  Patient   Disposition Plan: stepdown , observation   Time spent: 8mins  Florencia Reasons MD, PhD, FACP [facility] Pager [phone] If 7PM-7AM, please contact night-coverage  at [email or web address], password [facility]

## 2019-04-08 NOTE — ED Notes (Signed)
Patient transported to CT 

## 2019-04-08 NOTE — Progress Notes (Signed)
Subjective: Leonard Cisneros is well known to me from previous hip surgeries. He most recently had a right THA revision on 7/27 and did extremely well with discharge on 7/28. He said he was doing great at home until yesterday evening when he had shortness of breath after getting up from a chair after reading. He has not had any fever, chills or infectious symptoms. He noticed increased right thigh swelling yesterday afternoon which increased this morning and thus came to the ED for evaluation. He has a history of CHF and atrial fibrillation. Evaluation was negative for DVT/PE. It was determined that he was in CHF and he has been diuresed. He is also in Afib with RVR and is being treated for that. I was asked to see him in regards to his thigh swelling   Objective: Vital signs in last 24 hours: Temp:  [99.2 F (37.3 C)-99.3 F (37.4 C)] 99.2 F (37.3 C) (08/05 0730) Pulse Rate:  [80-139] 122 (08/05 1630) Resp:  [13-24] 16 (08/05 1630) BP: (112-150)/(60-89) 124/77 (08/05 1630) SpO2:  [91 %-100 %] 97 % (08/05 1630) Weight:  [828 kg] 108 kg (08/05 0730)  Intake/Output from previous day: No intake/output data recorded. Intake/Output this shift: Total I/O In: -  Out: 2200 [Urine:2200]  Recent Labs    04/08/19 0543  HGB 9.0*   Recent Labs    04/08/19 0543  WBC 15.8*  RBC 2.57*  HCT 28.1*  PLT 251   Recent Labs    04/08/19 0543  NA 136  K 4.3  CL 102  CO2 22  BUN 18  CREATININE 0.77  GLUCOSE 138*  CALCIUM 8.7*   Recent Labs    04/08/19 0543  INR 1.9*    His right hip incision is well healed with no erythema or exudate  He has significant swelling in the right thigh but compartments are soft. Neurovascular function is intact RLE There is 2 + edema in both lower legs    Assessment/Plan: S/P right THA revision- He has significant thigh swelling but no signs of infection. Workup for DVT/PE  was negative. Swelling is secondary to increased activity post-op as well as  significant fluid retention probably cardiac related. I do not suspect an active bleed as he would have needed to bleed 4-5 units to get that much thigh swelling and his hemoglobin does not reflect that. I suspect most of this will improve with diuresis. He is scheduled to see me in the office on 8/11. Please call with any other questions or concerns while in the hospital    Scarlette Hogston 04/08/2019, 4:41 PM

## 2019-04-08 NOTE — Progress Notes (Addendum)
Cardiology Consultation:   Patient ID: Leonard Cisneros MRN: [id number]; DOB: 1940/12/06  Admit date: 04/08/2019 Date of Consult: 04/08/2019  Primary Care Provider: [name], MD Primary Cardiologist: [name], MD  Primary Electrophysiologist:  None    Patient Profile:   Leonard Cisneros is a 78 y.o. male with a hx of CAD (s/p CABG in 2003 with cath in 2017 showing patent LIMA-LAD, patent RIMA-dRCA, and 99% stenosis along SVG-RI treated with BMS, low risk NST 10/2018 ), chronic atrial fibrillation, chronic anticoagulation w/ coumadin, HTN, HLD and recent hip surgery 7/27, who is being seen today for the evaluation of acute CHF and atrial fibrillation w/ RVR, at the request of Dr. Erlinda Hong, Internal Medicine.   History of Present Illness:   Mr. Leonard Cisneros has of h/o CAD s/p CABG in 2003 with cath in 2017 showing patent LIMA-LAD, patent RIMA-dRCA, and 99% stenosis along SVG-RI treated with BMS. He also has chronic atrial fibrillation that has been rate controlled w/ Cardizem and metoprolol. He is on coumadin for anticoagulation and his INRs have been followed in our coumadin clinic. He also has HTN and HLD. He has been followed by Dr. Claiborne Billings but lives in [place]. He was last seen by one of our providers in Feb 2020, when he was admitted to [facility] for chest pain. He was seen by Dr. Harl Bowie. He ruled out for MI with negative troponins.  His EKG showed no acute ischemic changes. Echocardiogram showed a preserved EF of 55-60% with no regional WMA. Did have severely dilated LA and moderately dilated RA. NST was performed and showed no significant perfusion defects, overall being a low-risk study. It was thought symptoms might have been secondary to volume overload and Lasix was titrated to 40mg  daily upon discharge. He had a telehealth visit w/ Bernerd Pho, PA-C, in April and was doing well w/o cardiac symptoms, denying CP, dyspnea, orthopnea, PND and LEE. He was asymptomatic w/ his afib  and his HR and BP were both controlled based on home monitor.   In May, we received clearance request for anticipated hip surgery. Pt was doing well w/o anginal or acute HF symptoms and was cleared by preop clinic to have surgery. 03/30/19, he had successful right hip arthoplasty revision by Dr. Wynelle Link. His post operative course was uncomplicated. He progressed well with PT and was discharged home next day on 7/28. Coumadin was resumed. INR day of d/c was 1.1. F/u outpatient check on 7/30 was 2.0.   Pt now reports to the [facility] ED w/ complaints of SOB, tachycardia and right LE edema. He was doing well post op until he noticed significant LE swelling and also was short of breath w/ activity. No chest pain. Reports compliance w/ home diuretic.   In the ED, INR is slightly subtherapeutic at 1.9. Chest CT is negative for PE. CXR shows cardiomegaly with mild vascular congestion. BNP elevated at 113.3. COVID test pending. His WBC ct is elevated at 15.8 (previously normal). He is afebrile. Slight drop in Hgb since his discharge on 7/28, down from 10.2>>9.0. Per review, he has had a steady decline in hgb since 2018, down from 14 >>9. MCV high at 109 c/w macrocytic anemia. EKG shows chronic atrial fibrillation. RVR 111 bpm. Hs Trop negative at 10 ng/dL.   He is feeling a bit better after IV lasix.    Heart Pathway Score:     Past Medical History:  Diagnosis Date   Atrial flutter ([place])    a.  s/p remote ablation by Dr. [name].   CAD (coronary artery disease)    a. s/p CABG 2003. b. 01/2016: unstable angina - s/p BMS to SVG-ramus intermediate, patent LIMA-mLAD, patent RIMA-dRCA.    CHF (congestive heart failure) ([place]) 11/2010   seen at [facility] then started lasix   Chronic atrial fibrillation    a. initially post op CABG then chronic. On Coumadin.   Chronic lower back pain    Dysrhythmia 2000   4 or 5 cardioversions.   Essential hypertension    High cholesterol    Ischemic cardiomyopathy    a. Cath  01/2016 -  EF 50-55% with mild distal inferior hypocontractility.   Mild aortic valve stenosis    PER ECHO 10-23-2018 IN EPIC    Unspecified hereditary and idiopathic peripheral neuropathy 04/04/2014    Past Surgical History:  Procedure Laterality Date   BACK SURGERY     CARDIAC CATHETERIZATION  ~ 2000; 2003   CARDIAC CATHETERIZATION N/A 02/01/2016   Procedure: Left Heart Cath and Cors/Grafts Angiography;  Surgeon: [name], MD;  Location: [place] [facility];  Service: Cardiovascular;  Laterality: N/A;   CARDIAC CATHETERIZATION N/A 02/01/2016   Procedure: Coronary Stent Intervention;  Surgeon: [name], MD;  Location: [place] [facility];  Service: Cardiovascular;  Laterality: N/A;   CATARACT EXTRACTION W/PHACO Left 02/09/2019   Procedure: CATARACT EXTRACTION PHACO AND INTRAOCULAR LENS PLACEMENT (IOC);  Surgeon: [name], MD;  Location: [facility];  Service: Ophthalmology;  Laterality: Left;  CDE: 27.70   CATARACT EXTRACTION W/PHACO Right 02/23/2019   Procedure: CATARACT EXTRACTION PHACO AND INTRAOCULAR LENS PLACEMENT RIGHT EYE;  Surgeon: [name], MD;  Location: [facility];  Service: Ophthalmology;  Laterality: Right;  CDE: 10.15   COLONOSCOPY  06/04/2002   Normal colon and rectum   COLONOSCOPY  07/08/2012   Procedure: COLONOSCOPY;  Surgeon: [name], MD;  Location: [facility];  Service: Endoscopy;  Laterality: N/A;  11:30   COLONOSCOPY N/A 10/02/2017   Procedure: COLONOSCOPY;  Surgeon: [name], MD;  Location: [facility];  Service: Endoscopy;  Laterality: N/A;  10:30am   CORONARY ANGIOPLASTY WITH STENT PLACEMENT  02/01/2016   CORONARY ARTERY BYPASS GRAFT  2003   LIMA to LAD,LIMA to distal RCA,SVG to ramus intermediate vessel.   FEMORAL REVISION Right 03/30/2019   Procedure: Femoral revision right total hip arthroplasty;  Surgeon: [name], MD;  Location: [facility];  Service: Orthopedics;  Laterality: Right;  15min   FOREIGN BODY REMOVAL Right  04/02/2014   Procedure: FOREIGN BODY REMOVAL RIGHT FOOT;  Surgeon: [name], MD;  Location: [facility];  Service: General;  Laterality: Right;   JOINT REPLACEMENT     JOINT REPLACEMENT     LUMBAR [place] SURGERY     REVISION TOTAL HIP ARTHROPLASTY Bilateral 2005-2012   right-left   SHOULDER OPEN ROTATOR CUFF REPAIR Right    TOTAL HIP ARTHROPLASTY Right 1999   TOTAL HIP ARTHROPLASTY Left 2008   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     Home Medications:  Prior to Admission medications   Medication Sig Start Date End Date Taking? Authorizing Provider  atorvastatin (LIPITOR) 80 MG tablet Take 1 tablet (80 mg total) by mouth daily for 30 days. 01/20/19 04/08/19 Yes [name], MD  diltiazem (DILACOR XR) 240 MG 24  hr capsule Take 1 capsule (240 mg total) by mouth daily. 12/25/18  Yes [name], MD  furosemide (LASIX) 40 MG tablet Take one daily Patient taking differently: Take 40 mg by mouth daily.  02/02/19  Yes [name], MD  HYDROcodone-acetaminophen (NORCO/VICODIN) 5-325 MG tablet Take 1-2 tablets by mouth every 6 (six) hours as needed for severe pain. 03/31/19  Yes Edmisten, Kristie L, PA  isosorbide mononitrate (IMDUR) 30 MG 24 hr tablet TAKE 1 TABLET BY MOUTH EVERY DAY Patient taking differently: Take 30 mg by mouth daily.  11/10/18  Yes [name], MD  methocarbamol (ROBAXIN) 500 MG tablet Take 1 tablet (500 mg total) by mouth every 6 (six) hours as needed for muscle spasms. 03/31/19  Yes Edmisten, Kristie L, PA  metoprolol succinate (TOPROL-XL) 50 MG 24 hr tablet TAKE 1 & 1/2 TABLETS BY MOUTH EVERY DAY Patient taking differently: Take 75 mg by mouth daily.  06/02/18  Yes [name], MD  nitroGLYCERIN (NITROSTAT) 0.4 MG SL tablet DISSOLVE 1 TABLET UNDER THE TONGUE EVERY 5  MINUTES UP TO 15 MINUTES FOR CHEST PAIN Patient taking differently: Place 0.4 mg under the tongue every 5 (five) minutes as needed for chest pain. DISSOLVE 1 TABLET UNDER THE TONGUE EVERY 5 MINUTES UP TO 15 MINUTES FOR CHEST PAIN 01/29/18  Yes [name], MD  potassium chloride (K-DUR) 10 MEQ tablet Take one every other day Patient taking differently: Take 10 mEq by mouth every other day.  02/02/19  Yes [name], MD  ramipril (ALTACE) 2.5 MG capsule TAKE ONE CAPSULE BY MOUTH EVERY DAY Patient taking differently: Take 2.5 mg by mouth daily.  01/28/19  Yes [name], MD  traMADol (ULTRAM) 50 MG tablet Take 1-2 tablets (50-100 mg total) by mouth every 6 (six) hours as needed for moderate pain. 03/31/19  Yes Edmisten, Kristie L, PA  warfarin (COUMADIN) 7.5 MG tablet Take 1/2 to 1 tablet daily as directed by coumadin clinic Patient taking differently: Take 3.25-7.5 mg by mouth See admin instructions. Take 3.75 mg by mouth on Mon and Fri and then take 7.5 mg by mouth on all other days (Tues, Wed, Thurs, Sat, Sun) 01/20/19  Yes [name], MD    Inpatient Medications: Scheduled Meds:  atorvastatin  80 mg Oral Daily   furosemide  40 mg Intravenous Once   Continuous Infusions:  diltiazem (CARDIZEM) infusion 7.5 mg/hr (04/08/19 0640)   PRN Meds: HYDROcodone-acetaminophen  Allergies:    Allergies  Allergen Reactions   Augmentin [Amoxicillin-Pot Clavulanate] Nausea And Vomiting and Other (See Comments)    Has patient had a PCN reaction causing immediate rash, facial/tongue/throat swelling, SOB or lightheadedness with hypotension: Unknown Has patient had a PCN reaction causing severe rash involving mucus membranes or skin necrosis: No Has patient had a PCN reaction that required hospitalization: No Has patient had a PCN reaction occurring within the last 10 years: No If all of the above answers are "NO", then may proceed with Cephalosporin use.     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  Social Needs   Financial resource strain: Not hard at all   Food insecurity  Worry: Never true    Inability: Never true   Transportation needs    Medical: No    Non-medical: No  Tobacco Use   Smoking status: Former Smoker    Packs/day: 0.50    Years: 2.00    Pack years: 1.00    Types: Cigarettes    Quit date: 09/03/1966    Years since quitting: 52.6   Smokeless tobacco: Never Used  Substance and Sexual Activity   Alcohol use: Yes    Alcohol/week: 11.0 standard drinks    Types: 7 Glasses of wine, 4 Cans of beer per week    Comment: a glass of wine with dinner/ beer on the weekend   Drug use: No   Sexual activity: Yes  Lifestyle   Physical activity    Days per week: Not on file    Minutes per session: Not on file   Stress: To some extent  Relationships   Social connections    Talks on phone: More than three times a week    Gets together: Three times a week    Attends religious service: Patient refused    Active member of club or organization: Patient refused    Attends meetings of clubs or organizations: Patient refused    Relationship status: Married   Intimate partner violence    Fear of current or ex partner: No    Emotionally abused: No    Physically abused: No    Forced sexual activity: No  Other Topics Concern   Not on file  Social History Narrative   Not on file    Family History:    Family History  Problem Relation Age of Onset   Heart attack Brother    Colon cancer Neg Hx      ROS:  Please see the history of present illness.   All other ROS reviewed and negative.     Physical Exam/Data:   Vitals:   04/08/19 1100 04/08/19 1130 04/08/19 1200  04/08/19 1215  BP: 124/77 134/77 137/74 132/70  Pulse: 98 (!) 116 (!) 106 92  Resp: 17 14 20    Temp:      TempSrc:      SpO2: 94% 95% 96% 97%  Weight:      Height:        Intake/Output Summary (Last 24 hours) at 04/08/2019 1229 Last data filed at 04/08/2019 0955 Gross per 24 hour  Intake 0 ml  Output 600 ml  Net -600 ml   Last 3 Weights 04/08/2019 03/30/2019 03/26/2019  Weight (lbs) 238 lb 1 oz 238 lb 1 oz 238 lb 1 oz  Weight (kg) 107.984 kg 107.984 kg 107.984 kg     Body mass index is 33.2 kg/m.  General:  Moderately obese elderly WM, Well nourished, well developed, in no acute distress HEENT: normal Lymph: no adenopathy Neck: no JVD Endocrine:  No thryomegaly Vascular: No carotid bruits; FA pulses 2+ bilaterally without bruits  Cardiac:  irregularly irregular rhythm, tachy rate  Lungs:  Bilateral wheezing at the bases Abd: soft, nontender, no hepatomegaly  Ext: 1+ bilateral LEE pitting edema, R>L, w/ overlying arrhythmia ? Cellulitis  Musculoskeletal:  No deformities, BUE and BLE strength normal and equal Skin: warm and dry  Neuro:  CNs 2-12 intact, no focal abnormalities noted Psych:  Normal affect   EKG:  The EKG was personally reviewed and demonstrates:  Atrial fibrillation 111 bpm, no ST abnormalties  Telemetry:  Telemetry was personally reviewed and demonstrates:  Atrial fibrillation 120s  currently   Relevant CV Studies: 2D Echo 10/23/18 IMPRESSIONS    1. The left ventricle has normal systolic function, with an ejection fraction of 55-60%. The cavity size was normal. There is moderately increased left ventricular wall thickness. Left ventricular diastology could not be evaluated secondary to atrial  fibrillation.  2. The right ventricle has normal systolic function. The cavity was normal. There is no increase in right ventricular wall thickness.  3. Left atrial size was severely dilated.  4. Right atrial size was moderately dilated.  5. The mitral valve is normal  in structure. Moderate thickening of the mitral valve leaflet. No evidence of mitral valve stenosis.  6. The tricuspid valve is normal in structure.  7. The aortic valve is tricuspid Moderate thickening of the aortic valve mild stenosis of the aortic valve.  8. The aortic root is normal in size and structure.  9. Pulmonary hypertension is indeterminant, inadequate TR jet. 10. The inferior vena cava was dilated in size with >50% respiratory variability.  NST 10/23/18 Study Result   There was no ST segment deviation noted during stress.  The study is normal. There are no perfusion defects  This is a low risk study.  The left ventricular ejection fraction is normal (55-65%).     Laboratory Data:  High Sensitivity Troponin:   Recent Labs  Lab 04/08/19 0543  TROPONINIHS 10     Cardiac EnzymesNo results for input(s): TROPONINI in the last 168 hours. No results for input(s): TROPIPOC in the last 168 hours.  Chemistry Recent Labs  Lab 04/08/19 0543  NA 136  K 4.3  CL 102  CO2 22  GLUCOSE 138*  BUN 18  CREATININE 0.77  CALCIUM 8.7*  GFRNONAA >60  GFRAA >60  ANIONGAP 12    No results for input(s): PROT, ALBUMIN, AST, ALT, ALKPHOS, BILITOT in the last 168 hours. Hematology Recent Labs  Lab 04/08/19 0543  WBC 15.8*  RBC 2.57*  HGB 9.0*  HCT 28.1*  MCV 109.3*  MCH 35.0*  MCHC 32.0  RDW 15.1  PLT 251   BNP Recent Labs  Lab 04/08/19 0543  BNP 113.3*    DDimer No results for input(s): DDIMER in the last 168 hours.   Radiology/Studies:  Ct Angio Chest Pe W And/or Wo Contrast  Result Date: 04/08/2019 CLINICAL DATA:  Shortness of breath, right leg swelling EXAM: CT ANGIOGRAPHY CHEST WITH CONTRAST TECHNIQUE: Multidetector CT imaging of the chest was performed using the standard protocol during bolus administration of intravenous contrast. Multiplanar CT image reconstructions and MIPs were obtained to evaluate the vascular anatomy. CONTRAST:  15mL OMNIPAQUE IOHEXOL  350 MG/ML SOLN COMPARISON:  Same day chest x-ray FINDINGS: Cardiovascular: Postoperative changes from CABG. Cardiomegaly. No pericardial effusion. The central pulmonary vasculature is adequately opacified. Respiratory motion degrades evaluation of the distal pulmonary arterial tree. No filling defect to the level of the lobar branch pulmonary arteries. Mediastinum/Nodes: Mildly prominent mediastinal and right hilar lymph nodes including a precarinal node measuring 10 mm short axis. Unremarkable thyroid gland. Trachea and esophagus within normal limits. Lungs/Pleura: Lungs are clear without focal airspace consolidation, pleural effusion, or pneumothorax. Upper Abdomen: No acute abnormality. Musculoskeletal: No chest wall abnormality. No acute or significant osseous findings. Review of the MIP images confirms the above findings. IMPRESSION: 1. Negative for pulmonary embolism to the lobar branch level. Respiratory motion degrades evaluation of the more distal pulmonary arterial tree. 2. Lungs are clear. 3. Borderline prominent mediastinal and right hilar lymph nodes, nonspecific. 4. Cardiomegaly.  Electronically Signed   By: Davina Poke M.D.   On: 04/08/2019 09:30   Dg Chest Portable 1 View  Result Date: 04/08/2019 CLINICAL DATA:  Shortness of breath EXAM: PORTABLE CHEST 1 VIEW COMPARISON:  10/22/2018 FINDINGS: Cardiomegaly with mild vascular prominence centrally. There has been CABG. No edema, consolidation, effusion, or pneumothorax. IMPRESSION: Cardiomegaly and mild vascular congestion. Electronically Signed   By: Monte Fantasia M.D.   On: 04/08/2019 06:38   Vas [place] Lower Extremity Venous (dvt) (only [facility])  Result Date: 04/08/2019  Lower Venous Study Indications: Edema.  Limitations: Body habitus and poor ultrasound/tissue interface. Comparison Study: no prior Performing Technologist: Abram Sander RVS  Examination Guidelines: A complete evaluation includes B-mode imaging, spectral Doppler, color  Doppler, and power Doppler as needed of all accessible portions of each vessel. Bilateral testing is considered an integral part of a complete examination. Limited examinations for reoccurring indications may be performed as noted.  +---------+---------------+---------+-----------+----------+--------------+  RIGHT     Compressibility Phasicity Spontaneity Properties Summary         +---------+---------------+---------+-----------+----------+--------------+  CFV       Full            Yes       Yes                                    +---------+---------------+---------+-----------+----------+--------------+  SFJ       Full                                                             +---------+---------------+---------+-----------+----------+--------------+  FV Prox   Full                                                             +---------+---------------+---------+-----------+----------+--------------+  FV Mid                    Yes       Yes                                    +---------+---------------+---------+-----------+----------+--------------+  FV Distal                 Yes       Yes                                    +---------+---------------+---------+-----------+----------+--------------+  PFV       Full                                                             +---------+---------------+---------+-----------+----------+--------------+  POP       Full  Yes       Yes                                    +---------+---------------+---------+-----------+----------+--------------+  PTV       Full                                                             +---------+---------------+---------+-----------+----------+--------------+  PERO                                                       Not visualized  +---------+---------------+---------+-----------+----------+--------------+   +----+---------------+---------+-----------+----------+-------+   LEFT Compressibility Phasicity Spontaneity Properties Summary  +----+---------------+---------+-----------+----------+-------+  CFV  Full            Yes       Yes                             +----+---------------+---------+-----------+----------+-------+     Summary: Right: There is no evidence of deep vein thrombosis in the lower extremity. However, portions of this examination were limited- see technologist comments above. No cystic structure found in the popliteal fossa. Left: No evidence of common femoral vein obstruction.  *See table(s) above for measurements and observations. Electronically signed by Curt Jews MD on 04/08/2019 at 11:10:09 AM.    Final     Assessment and Plan:   TORI CUPPS is a 78 y.o. male with a hx of CAD (s/p CABG in 2003 with cath in 2017 showing patent LIMA-LAD, patent RIMA-dRCA, and 99% stenosis along SVG-RI treated with BMS, low risk NST 10/2018 ), chronic atrial fibrillation, chronic anticoagulation w/ coumadin, HTN, HLD and recent hip surgery 7/27, who is being seen today for the evaluation of acute CHF and atrial fibrillation w/ RVR, at the request of Dr. Erlinda Hong, Internal Medicine.    1. Atrial Fibrillation w/ RVR: he has chronic atrial fibrillation that has been rate controlled w/ Cardizem and metoprolol. EKG in the ED shows RVR 111 bpm. Rapid ventricular response likely triggered by other acute underlying problem, such as volume overload from acute CHF, ? Infectious process given leukocytosis or anemia given further decline in hgb, now at 9.0. For now, we will continue with rate control and will titrate as needed. EDP has started IV Cardizem. We will treat acute CHF/ hypervolemia w/ diuretics. Primary team to w/u leukocytosis. Continue coumadin for a/c.   2. HFpEF:  CXR shows cardiomegaly with mild vascular congestion. BNP elevated at 113.3. SCr and K ok. He received a dose of IV Lasix in the ED @1300 , 40 mg x 1. Will monitor response. I will order strict I/Os and daily  weights. Will reassess response and will order additional Lasix if needed.   3. CAD: s/p CABG in 2003 with cath in 2017 showing patent LIMA-LAD, patent RIMA-dRCA, and 99% stenosis along SVG-RI treated with BMS. He had a recent NST 10/2018 that was low risk. No perfusion defects to suggest ischemia. Hs Trop in the ED is negative at 10 ng/dL.  He  denies CP.   4. Anemia: Slight drop in Hgb since his discharge on 7/28, down from 10.2>>9.0. Per review, he has had a steady decline in hgb since 2018, down from 14 >>9. MCV high at 109 c/w macrocytic anemia. Consider checking for folate and B-12 deficiency. Will defer to primary team.   5. Leukocytosis: WBC ct 15.8. Previously normal on 7/28 at 10.0. He is afebrile. Presenting w/ dyspnea and afib w/ RVR. COVID test pending. CXR w/o infiltrate. Continue to monitor. If further upward trend, consider blood cultures and antibiotics. Given recent hip surgery w/ hardware manipulation, cannot r/o septic arthritis, but doubtful if afebrile. Consider checking UA if WBC continues to rise.  Will defer further w/u to primary team.  6. Chronic Anticoagulation: on coumadin for chronic afib. INRs followed in our coumadin clinic. Recently held for hip surgery but resumed 7/28. INR slightly subtherapeutic at 1.9. Luckily chest CT negative for PE. Given unilateral LEE (although may be 2/2 acute CHF), consider LE venous doppler to w/o DVT given subtherapeutic INR and recent right sided hip surgery. Will defer to primary team.    7. LEE: bilateral pitting edema R>L with overlying erythema . ? Cellulitis. ? DVT. Further w/u per IM. CHF also likely contributing. Continue diuretics.     MD to follow with further recs.   For questions or updates, please contact [place] Please consult [email or web address] for contact info under    Signed, Lyda Jester, PA-C  04/08/2019 12:29 PM   History and all data above reviewed.  Patient examined.  I agree with the findings as above.  The  patient presents for evaluation of right leg swelling.  He is postop as above.  He said he was doing well and just a day and a half ago was able do physical therapy.  He has been up walking with a walker.  However, he says he had increased right leg swelling suddenly.  He is not had any fevers chills or shortness of breath.  He is in chronic atrial fibrillation.  He now presents with a rapid ventricular rate compared to his baseline.  He is not describing new PND or orthopnea.  He is not been having any chest pressure, neck or arm discomfort.  CT does not suggest pulmonary embolism or identified pulmonary edema.  Lower extremity ultrasound does not suggest DVT and his hemoglobin does not demonstrate a significant drop.  He does have elevated white blood cell count.  The patient exam reveals [id number]  ,  Lungs: Clear  ,  Abd: Positive bowel sounds, no rebound no guarding, Ext Severe edema right leg without erythema ecchymosis or wound drainage.  All available labs, radiology testing, previous records reviewed. Agree with documented assessment and plan. Acute diastolic HF: There is clearly a component of acute volume overload.  At this point we will proceed with diuresis.  He may be having some component of worsening ability to handle volume secondary to increased rate secondary to the pain and stress of rehab, ambulating and PT.  For now we can control his rate with the IV Cardizem as we diurese and see how he improves over time with lessening discomfort.  Jeneen Rinks Cieara Stierwalt  5:26 PM  04/08/2019

## 2019-04-08 NOTE — Progress Notes (Signed)
[facility] for warfarin Indication: atrial fibrillation  Allergies  Allergen Reactions  . Augmentin [Amoxicillin-Pot Clavulanate] Nausea And Vomiting and Other (See Comments)    Has patient had a PCN reaction causing immediate rash, facial/tongue/throat swelling, SOB or lightheadedness with hypotension: Unknown Has patient had a PCN reaction causing severe rash involving mucus membranes or skin necrosis: No Has patient had a PCN reaction that required hospitalization: No Has patient had a PCN reaction occurring within the last 10 years: No If all of the above answers are "NO", then may proceed with Cephalosporin use.     Patient Measurements: Height: 5\' 11"  (180.3 cm) Weight: 238 lb 1 oz (108 kg) IBW/kg (Calculated) : 75.3  Vital Signs: Temp: 99.2 F (37.3 C) (08/05 0730) Temp Source: Oral (08/05 0730) BP: 129/65 (08/05 1300) Pulse Rate: 93 (08/05 1300)  Labs: Recent Labs    04/08/19 0543  HGB 9.0*  HCT 28.1*  PLT 251  LABPROT 21.2*  INR 1.9*  CREATININE 0.77  TROPONINIHS 10    Estimated Creatinine Clearance: 96.7 mL/min (by C-G formula based on SCr of 0.77 mg/dL).   Medical History: Past Medical History:  Diagnosis Date  . Atrial flutter ([place])    a. s/p remote ablation by Dr. [name].  [name] CAD (coronary artery disease)    a. s/p CABG 2003. b. 01/2016: unstable angina - s/p BMS to SVG-ramus intermediate, patent LIMA-mLAD, patent RIMA-dRCA.   [name] CHF (congestive heart failure) ([place]) 11/2010   seen at [facility] then started lasix  . Chronic atrial fibrillation    a. initially post op CABG then chronic. On Coumadin.  . Chronic lower back pain   . Dysrhythmia 2000   4 or 5 cardioversions.  . Essential hypertension   . High cholesterol   . Ischemic cardiomyopathy    a. Cath 01/2016 -  EF 50-55% with mild distal inferior hypocontractility.  . Mild aortic valve stenosis    PER ECHO 10-23-2018 IN [facility]   . Unspecified hereditary and  idiopathic peripheral neuropathy 04/04/2014    Medications:  (Not in a hospital admission)  Scheduled:  . atorvastatin  80 mg Oral Daily   Assessment: 38 yoM with PMH Afib on warfarin, dCHF, CABG 2003, PCI 2017, HTN, s/p recent R THA 7/27. Now presenting with SOB, tachycardia, RLE edema; found to be in [place] and admitted for IV treatment. Pharmacy to continue warfarin dosing while inpatient.   Baseline INR slightly subtherapeutic  Prior anticoagulation: warfarin 7.5 mg daily except 3.75 mg Mon and Fri; LD 8/4  Significant events:  Today, 04/08/2019:  CBC: Hgb low, suspect d/t postop ABLA (baseline ~11-12 g)  INR slightly SUBtherapeutic  Major drug interactions: none  No bleeding issues per nursing  Diet ordered  Goal of Therapy: INR 2-3  Plan:  Warfarin 7.5 mg PO tonight at 18:00  Daily INR  CBC at least q72 hr while on warfarin  Monitor for signs of bleeding or thrombosis   Reuel Boom, PharmD, BCPS [phone] 04/08/2019, 2:32 PM

## 2019-04-08 NOTE — Progress Notes (Signed)
Lower extremity venous has been completed.   Preliminary results in CV Proc.   Abram Sander 04/08/2019 8:36 AM

## 2019-04-08 NOTE — ED Provider Notes (Signed)
TIME SEEN: 5:18 AM  CHIEF COMPLAINT: Shortness of breath, right leg swelling  HPI: Patient is a 78 year old male with history of atrial flutter on Coumadin, CAD status post CABG and subsequent stents, CHF, hypertension, hyperlipidemia who presents to the emergency department with shortness of breath.  Patient reports he underwent right hip replacement with Dr. Wynelle Link on July 27.  States he was doing well until the past couple of days where his right leg increased in size and has been very swollen and painful.  He is able to ambulate.  Denies any injury.  States the incision has been clean and dry without drainage.  He denies any fever.  No cough.  States tonight he started feeling very short of breath and he was worried that he could have a PE or CHF exacerbation.  No chest pain or chest discomfort.  ROS: See HPI Constitutional: no fever  Eyes: no drainage  ENT: no runny nose   Cardiovascular:   chest pain  Resp:  SOB  GI: no vomiting GU: no dysuria Integumentary: no rash  Allergy: no hives  Musculoskeletal: no leg swelling  Neurological: no slurred speech ROS otherwise negative  PAST MEDICAL HISTORY/PAST SURGICAL HISTORY:  Past Medical History:  Diagnosis Date  . Atrial flutter ([place])    a. s/p remote ablation by Dr. [name].  [name] CAD (coronary artery disease)    a. s/p CABG 2003. b. 01/2016: unstable angina - s/p BMS to SVG-ramus intermediate, patent LIMA-mLAD, patent RIMA-dRCA.   [name] CHF (congestive heart failure) ([place]) 11/2010   seen at [facility] then started lasix  . Chronic atrial fibrillation    a. initially post op CABG then chronic. On Coumadin.  . Chronic lower back pain   . Dysrhythmia 2000   4 or 5 cardioversions.  . Essential hypertension   . High cholesterol   . Ischemic cardiomyopathy    a. Cath 01/2016 -  EF 50-55% with mild distal inferior hypocontractility.  . Mild aortic valve stenosis    PER ECHO 10-23-2018 IN [facility]   . Unspecified hereditary and idiopathic peripheral  neuropathy 04/04/2014    MEDICATIONS:  Prior to Admission medications   Medication Sig Start Date End Date Taking? Authorizing Provider  atorvastatin (LIPITOR) 80 MG tablet Take 1 tablet (80 mg total) by mouth daily for 30 days. 01/20/19 03/30/19  [name], MD  diltiazem (DILACOR XR) 240 MG 24 hr capsule Take 1 capsule (240 mg total) by mouth daily. 12/25/18   [name], MD  furosemide (LASIX) 40 MG tablet Take one daily 02/02/19   [name], MD  HYDROcodone-acetaminophen (NORCO/VICODIN) 5-325 MG tablet Take 1-2 tablets by mouth every 6 (six) hours as needed for severe pain. 03/31/19   Edmisten, Kristie L, PA  isosorbide mononitrate (IMDUR) 30 MG 24 hr tablet TAKE 1 TABLET BY MOUTH EVERY DAY Patient taking differently: Take 30 mg by mouth daily.  11/10/18   [name], MD  methocarbamol (ROBAXIN) 500 MG tablet Take 1 tablet (500 mg total) by mouth every 6 (six) hours as needed for muscle spasms. 03/31/19   Edmisten, Kristie L, PA  metoprolol succinate (TOPROL-XL) 50 MG 24 hr tablet TAKE 1 & 1/2 TABLETS BY MOUTH EVERY DAY Patient taking differently: Take 75 mg by mouth daily.  06/02/18   [name], MD  nitroGLYCERIN (NITROSTAT) 0.4 MG SL tablet DISSOLVE 1 TABLET UNDER THE TONGUE EVERY 5 MINUTES UP TO 15 MINUTES FOR CHEST PAIN Patient taking differently: Place 0.4 mg  under the tongue every 5 (five) minutes as needed for chest pain. DISSOLVE 1 TABLET UNDER THE TONGUE EVERY 5 MINUTES UP TO 15 MINUTES FOR CHEST PAIN 01/29/18   [name], MD  potassium chloride (K-DUR) 10 MEQ tablet Take one every other day 02/02/19   [name], MD  ramipril (ALTACE) 2.5 MG capsule TAKE ONE CAPSULE BY MOUTH EVERY DAY 01/28/19   [name], MD  traMADol (ULTRAM) 50 MG tablet Take 1-2 tablets (50-100 mg total) by mouth every 6 (six) hours as needed for moderate pain. 03/31/19   Edmisten, Ok Anis, PA  warfarin (COUMADIN) 7.5 MG tablet Take 1/2 to 1 tablet daily as directed by coumadin  clinic Patient taking differently: Take 3.25-7.5 mg by mouth See admin instructions. Take 1/2 to 1 tablet daily as directed by coumadin clinic; Mon and Fri 3.25 mg daily, and all other days is 7.5 mg 01/20/19   [name], MD    ALLERGIES:  Allergies  Allergen Reactions  . Augmentin [Amoxicillin-Pot Clavulanate] Nausea And Vomiting and Other (See Comments)    Has patient had a PCN reaction causing immediate rash, facial/tongue/throat swelling, SOB or lightheadedness with hypotension: Unknown Has patient had a PCN reaction causing severe rash involving mucus membranes or skin necrosis: No Has patient had a PCN reaction that required hospitalization: No Has patient had a PCN reaction occurring within the last 10 years: No If all of the above answers are "NO", then may proceed with Cephalosporin use.     SOCIAL HISTORY:  Social History   Tobacco Use  . Smoking status: Former Smoker    Packs/day: 0.50    Years: 2.00    Pack years: 1.00    Types: Cigarettes    Quit date: 09/03/1966    Years since quitting: 52.6  . Smokeless tobacco: Never Used  Substance Use Topics  . Alcohol use: Yes    Alcohol/week: 11.0 standard drinks    Types: 7 Glasses of wine, 4 Cans of beer per week    Comment: a glass of wine with dinner/ beer on the weekend    FAMILY HISTORY: Family History  Problem Relation Age of Onset  . Heart attack Brother   . Colon cancer Neg Hx     EXAM: BP 127/71   Pulse (!) 131   Temp 99.3 F (37.4 C) (Oral)   Resp 20   SpO2 97%  CONSTITUTIONAL: Alert and oriented and responds appropriately to questions.  Elderly, in no distress HEAD: Normocephalic EYES: Conjunctivae clear, pupils appear equal, EOMI ENT: normal nose; moist mucous membranes NECK: Supple, no meningismus, no nuchal rigidity, no LAD  CARD: Irregularly irregular and tachycardic; S1 and S2 appreciated; no murmurs, no clicks, no rubs, no gallops RESP: Normal chest excursion without splinting or  tachypnea; breath sounds clear and equal bilaterally; no wheezes, no rhonchi, no rales, no hypoxia or respiratory distress, speaking full sentences ABD/GI: Normal bowel sounds; non-distended; soft, non-tender, no rebound, no guarding, no peritoneal signs, no hepatosplenomegaly BACK:  The back appears normal and is non-tender to palpation, there is no CVA tenderness EXT: Patient has peripheral edema in bilateral lower extremities that is 2+ pitting.  His right leg is significantly more swollen than the left diffusely.  His compartments however are soft and he reports normal sensation in this leg.  Right hip lateral incision is clean, dry and intact without bleeding or drainage.  He has no redness or warmth in his legs.  I am able to  Doppler biphasic DP pulses bilaterally. SKIN: Normal color for age and race; warm; no rash NEURO: Moves all extremities equally PSYCH: The patient's mood and manner are appropriate. Grooming and personal hygiene are appropriate.  MEDICAL DECISION MAKING: Patient here with shortness of breath.  He is in A. fib with RVR.  Will start diltiazem.  CHF is on the differential given his history and bilateral peripheral edema.  I am also concerned about PE given increased swelling of his right leg with recent surgery and immobilization.  He is on Coumadin.  I think he needs a CTA of his chest, Doppler of his right lower extremity.  I feel he will need admission.  ED PROGRESS: [name] labs show leukocytosis of 15,000 with left shift.  He is afebrile here.  Troponin is negative.  BNP is 113.  His INR is subtherapeutic at 1.9.  Chest x-ray shows cardiomegaly with mild vascular congestion but no overt edema seen.  Doppler of the right leg and CTA of the chest pending.  Patient will need admission to the hospital.  Signed out to oncoming ED physician to follow-up on patient's CT results.   I reviewed all nursing notes, vitals, pertinent previous records, EKGs, lab and urine results,  imaging (as available).      EKG Interpretation  Date/Time:  Wednesday April 08 2019 05:06:57 EDT Ventricular Rate:  111 PR Interval:    QRS Duration: 106 QT Interval:  337 QTC Calculation: 458 R Axis:   94 Text Interpretation:  Atrial fibrillation Right axis deviation Low voltage, extremity and precordial leads No significant change since last tracing Confirmed by Ward, Cyril Mourning [phone]) on 04/08/2019 5:15:07 AM         CRITICAL CARE Performed by: Cyril Mourning Ward   Total critical care time: 45 minutes  Critical care time was exclusive of separately billable procedures and treating other patients.  Critical care was necessary to treat or prevent imminent or life-threatening deterioration.  Critical care was time spent personally by me on the following activities: development of treatment plan with patient and/or surrogate as well as nursing, discussions with consultants, evaluation of patient's response to treatment, examination of patient, obtaining history from patient or surrogate, ordering and performing treatments and interventions, ordering and review of laboratory studies, ordering and review of radiographic studies, pulse oximetry and re-evaluation of patient's condition.    Ward, Delice Bison, DO 04/08/19 [id number]

## 2019-04-09 DIAGNOSIS — S7001XA Contusion of right hip, initial encounter: Secondary | ICD-10-CM | POA: Diagnosis not present

## 2019-04-09 DIAGNOSIS — Z96643 Presence of artificial hip joint, bilateral: Secondary | ICD-10-CM | POA: Diagnosis present

## 2019-04-09 DIAGNOSIS — R0989 Other specified symptoms and signs involving the circulatory and respiratory systems: Secondary | ICD-10-CM | POA: Diagnosis not present

## 2019-04-09 DIAGNOSIS — I350 Nonrheumatic aortic (valve) stenosis: Secondary | ICD-10-CM | POA: Diagnosis present

## 2019-04-09 DIAGNOSIS — Z79899 Other long term (current) drug therapy: Secondary | ICD-10-CM | POA: Diagnosis not present

## 2019-04-09 DIAGNOSIS — G8929 Other chronic pain: Secondary | ICD-10-CM | POA: Diagnosis present

## 2019-04-09 DIAGNOSIS — D62 Acute posthemorrhagic anemia: Secondary | ICD-10-CM | POA: Diagnosis not present

## 2019-04-09 DIAGNOSIS — Z951 Presence of aortocoronary bypass graft: Secondary | ICD-10-CM | POA: Diagnosis not present

## 2019-04-09 DIAGNOSIS — Z7901 Long term (current) use of anticoagulants: Secondary | ICD-10-CM | POA: Diagnosis not present

## 2019-04-09 DIAGNOSIS — I482 Chronic atrial fibrillation, unspecified: Secondary | ICD-10-CM | POA: Diagnosis present

## 2019-04-09 DIAGNOSIS — Z79891 Long term (current) use of opiate analgesic: Secondary | ICD-10-CM | POA: Diagnosis not present

## 2019-04-09 DIAGNOSIS — I4819 Other persistent atrial fibrillation: Secondary | ICD-10-CM | POA: Diagnosis present

## 2019-04-09 DIAGNOSIS — Z87891 Personal history of nicotine dependence: Secondary | ICD-10-CM | POA: Diagnosis not present

## 2019-04-09 DIAGNOSIS — R600 Localized edema: Secondary | ICD-10-CM | POA: Diagnosis not present

## 2019-04-09 DIAGNOSIS — E785 Hyperlipidemia, unspecified: Secondary | ICD-10-CM | POA: Diagnosis present

## 2019-04-09 DIAGNOSIS — I5033 Acute on chronic diastolic (congestive) heart failure: Secondary | ICD-10-CM | POA: Diagnosis not present

## 2019-04-09 DIAGNOSIS — I4821 Permanent atrial fibrillation: Secondary | ICD-10-CM | POA: Diagnosis not present

## 2019-04-09 DIAGNOSIS — I4891 Unspecified atrial fibrillation: Secondary | ICD-10-CM | POA: Diagnosis present

## 2019-04-09 DIAGNOSIS — I5021 Acute systolic (congestive) heart failure: Secondary | ICD-10-CM | POA: Diagnosis not present

## 2019-04-09 DIAGNOSIS — I110 Hypertensive heart disease with heart failure: Secondary | ICD-10-CM | POA: Diagnosis present

## 2019-04-09 DIAGNOSIS — E876 Hypokalemia: Secondary | ICD-10-CM | POA: Diagnosis present

## 2019-04-09 DIAGNOSIS — D539 Nutritional anemia, unspecified: Secondary | ICD-10-CM | POA: Diagnosis not present

## 2019-04-09 DIAGNOSIS — M545 Low back pain: Secondary | ICD-10-CM | POA: Diagnosis present

## 2019-04-09 DIAGNOSIS — N281 Cyst of kidney, acquired: Secondary | ICD-10-CM | POA: Diagnosis not present

## 2019-04-09 DIAGNOSIS — R0602 Shortness of breath: Secondary | ICD-10-CM | POA: Diagnosis present

## 2019-04-09 DIAGNOSIS — I2510 Atherosclerotic heart disease of native coronary artery without angina pectoris: Secondary | ICD-10-CM | POA: Diagnosis not present

## 2019-04-09 DIAGNOSIS — Z9861 Coronary angioplasty status: Secondary | ICD-10-CM | POA: Diagnosis not present

## 2019-04-09 DIAGNOSIS — Z20828 Contact with and (suspected) exposure to other viral communicable diseases: Secondary | ICD-10-CM | POA: Diagnosis present

## 2019-04-09 LAB — BASIC METABOLIC PANEL
Anion gap: 10 (ref 5–15)
BUN: 16 mg/dL (ref 8–23)
CO2: 25 mmol/L (ref 22–32)
Calcium: 8.7 mg/dL — ABNORMAL LOW (ref 8.9–10.3)
Chloride: 103 mmol/L (ref 98–111)
Creatinine, Ser: 0.84 mg/dL (ref 0.61–1.24)
GFR calc Af Amer: >60 mL/min (ref >60)
GFR calc non Af Amer: >60 mL/min (ref >60)
Glucose, Bld: 135 mg/dL — ABNORMAL HIGH (ref 70–99)
Potassium: 4.1 mmol/L (ref 3.5–5.1)
Sodium: 138 mmol/L (ref 135–145)

## 2019-04-09 LAB — CBC
HCT: 24.8 % — ABNORMAL LOW (ref 39.0–52.0)
Hemoglobin: 7.9 g/dL — ABNORMAL LOW (ref 13.0–17.0)
MCH: 35.9 pg — ABNORMAL HIGH (ref 26.0–34.0)
MCHC: 31.9 g/dL (ref 30.0–36.0)
MCV: 112.7 fL — ABNORMAL HIGH (ref 80.0–100.0)
Platelets: 407 10*3/uL — ABNORMAL HIGH (ref 150–400)
RBC: 2.2 MIL/uL — ABNORMAL LOW (ref 4.22–5.81)
RDW: 15.1 % (ref 11.5–15.5)
WBC: 12.5 10*3/uL — ABNORMAL HIGH (ref 4.0–10.5)
nRBC: 0.2 % (ref 0.0–0.2)

## 2019-04-09 LAB — URINALYSIS, ROUTINE W REFLEX MICROSCOPIC
Bilirubin Urine: NEGATIVE
Glucose, UA: NEGATIVE mg/dL
Hgb urine dipstick: NEGATIVE
Ketones, ur: NEGATIVE mg/dL
Leukocytes,Ua: NEGATIVE
Nitrite: NEGATIVE
Protein, ur: NEGATIVE mg/dL
Specific Gravity, Urine: 1.011 (ref 1.005–1.030)
pH: 5 (ref 5.0–8.0)

## 2019-04-09 LAB — PROTIME-INR
INR: 1.4 — ABNORMAL HIGH (ref 0.8–1.2)
Prothrombin Time: 16.6 seconds — ABNORMAL HIGH (ref 11.4–15.2)

## 2019-04-09 LAB — VITAMIN B12: Vitamin B-12: 338 pg/mL (ref 180–914)

## 2019-04-09 LAB — FOLATE: Folate: 8.9 ng/mL (ref >5.9)

## 2019-04-09 LAB — MAGNESIUM: Magnesium: 2 mg/dL (ref 1.7–2.4)

## 2019-04-09 LAB — TSH: TSH: 1.321 u[IU]/mL (ref 0.350–4.500)

## 2019-04-09 MED ORDER — CHLORHEXIDINE GLUCONATE CLOTH 2 % EX PADS
6.0000 | MEDICATED_PAD | Freq: Every day | CUTANEOUS | Status: DC
  Administered 2019-04-09: 09:00:00 6 via TOPICAL

## 2019-04-09 MED ORDER — DILTIAZEM HCL 60 MG PO TABS
60.0000 mg | ORAL_TABLET | Freq: Three times a day (TID) | ORAL | Status: DC
  Administered 2019-04-09 – 2019-04-13 (×12): 60 mg via ORAL
  Filled 2019-04-09 (×12): qty 1

## 2019-04-09 MED ORDER — WARFARIN SODIUM 5 MG PO TABS
10.0000 mg | ORAL_TABLET | Freq: Once | ORAL | Status: AC
  Administered 2019-04-09: 10 mg via ORAL
  Filled 2019-04-09: qty 2

## 2019-04-09 MED ORDER — ACETAMINOPHEN 325 MG PO TABS
650.0000 mg | ORAL_TABLET | Freq: Four times a day (QID) | ORAL | Status: DC | PRN
  Filled 2019-04-09: qty 2

## 2019-04-09 NOTE — Progress Notes (Signed)
[facility] for warfarin Indication: hx atrial fibrillation  Allergies  Allergen Reactions  . Augmentin [Amoxicillin-Pot Clavulanate] Nausea And Vomiting and Other (See Comments)    Has patient had a PCN reaction causing immediate rash, facial/tongue/throat swelling, SOB or lightheadedness with hypotension: Unknown Has patient had a PCN reaction causing severe rash involving mucus membranes or skin necrosis: No Has patient had a PCN reaction that required hospitalization: No Has patient had a PCN reaction occurring within the last 10 years: No If all of the above answers are "NO", then may proceed with Cephalosporin use.     Patient Measurements: Height: 5\' 11"  (180.3 cm) Weight: 236 lb 1.8 oz (107.1 kg) IBW/kg (Calculated) : 75.3 Heparin Dosing Weight:   Vital Signs: Temp: 98.6 F (37 C) (08/06 0753) Temp Source: Oral (08/06 0753) BP: 127/56 (08/06 0500) Pulse Rate: 120 (08/06 0500)  Labs: Recent Labs    04/08/19 0543 04/09/19 0433  HGB 9.0* 7.9*  HCT 28.1* 24.8*  PLT 251 407*  LABPROT 21.2* 16.6*  INR 1.9* 1.4*  CREATININE 0.77 0.84  TROPONINIHS 10  --     Estimated Creatinine Clearance: 91.7 mL/min (by C-G formula based on SCr of 0.84 mg/dL).   Medications:  - PTA warfarin regimen: home dose: 7.5 mg daily except 3.75 mg on Mon and Fri per [facility] note on 8/3  Assessment: [name] a 78 y.o M with hx recent right THA revision on 7/27 (discharge on 7/28), heart failure, and chronic afib on warfarin PTA, presented to the ED on 8/5 with c/o SOB and LE swelling.  Chest CTA was negative for PE and LE doppler negative for DVT.  Warfarin resumed on admission.  Today, 04/09/2019: - INR is subtherapeutic at 1.4 - hgb down 7.9--> Dr. Anne Fu note indicated that this may be dilutional d/t fluid overload - no bleeding documented - no significant drug-drug intxns - on cardiac diet  Goal of Therapy:  INR 2-3 Monitor platelets by  anticoagulation protocol: Yes   Plan:  - Warfarin 10 mg PO x1 - daily INR - monitor for s/s bleeding  Latecia Miler P 04/09/2019,8:51 AM

## 2019-04-09 NOTE — Progress Notes (Addendum)
Progress Note  Patient Name: Leonard Cisneros Date of Encounter: 04/09/2019  Primary Cardiologist: [name], MD   Subjective   Feeling better. Wife present at bedside. Edema improving. No resting dyspnea. Denies CP.   Inpatient Medications    Scheduled Meds:  atorvastatin  80 mg Oral Daily   furosemide  40 mg Intravenous BID   warfarin  10 mg Oral ONCE-1800   Warfarin - Pharmacist Dosing Inpatient   Does not apply q1800   Continuous Infusions:  diltiazem (CARDIZEM) infusion 15 mg/hr (04/09/19 0929)   PRN Meds: acetaminophen, HYDROcodone-acetaminophen   Vital Signs    Vitals:   04/09/19 0312 04/09/19 0500 04/09/19 0753 04/09/19 0800  BP:  (!) 127/56  131/85  Pulse:  (!) 120  (!) 104  Resp:  20  17  Temp: 99.2 F (37.3 C)  98.6 F (37 C)   TempSrc: Oral  Oral   SpO2:  96%  97%  Weight:  107.1 kg    Height:        Intake/Output Summary (Last 24 hours) at 04/09/2019 1058 Last data filed at 04/09/2019 0929 Gross per 24 hour  Intake 600.05 ml  Output 2650 ml  Net -2049.95 ml   Last 3 Weights 04/09/2019 04/08/2019 03/30/2019  Weight (lbs) 236 lb 1.8 oz 238 lb 1 oz 238 lb 1 oz  Weight (kg) 107.1 kg 107.984 kg 107.984 kg      Telemetry    Chronic atrial fibrillation, 90s- low 100s- Personally Reviewed  ECG    Not performed today - Personally Reviewed  Physical Exam   GEN: No acute distress.   Neck: No JVD Cardiac: irregularly irregular rhythm, mildly tachy rate , no murmurs, rubs, or gallops.  Respiratory: Clear to auscultation bilaterally. GI: Soft, nontender, non-distended  MS: 1+ LE edema; R>L Neuro:  Nonfocal  Psych: Normal affect   Labs    High Sensitivity Troponin:   Recent Labs  Lab 04/08/19 0543  TROPONINIHS 10      Cardiac EnzymesNo results for input(s): TROPONINI in the last 168 hours. No results for input(s): TROPIPOC in the last 168 hours.   Chemistry Recent Labs  Lab 04/08/19 0543 04/09/19 0433  NA 136 138  K 4.3 4.1  CL  102 103  CO2 22 25  GLUCOSE 138* 135*  BUN 18 16  CREATININE 0.77 0.84  CALCIUM 8.7* 8.7*  GFRNONAA >60 >60  GFRAA >60 >60  ANIONGAP 12 10     Hematology Recent Labs  Lab 04/08/19 0543 04/09/19 0433  WBC 15.8* 12.5*  RBC 2.57* 2.20*  HGB 9.0* 7.9*  HCT 28.1* 24.8*  MCV 109.3* 112.7*  MCH 35.0* 35.9*  MCHC 32.0 31.9  RDW 15.1 15.1  PLT 251 407*    BNP Recent Labs  Lab 04/08/19 0543  BNP 113.3*     DDimer No results for input(s): DDIMER in the last 168 hours.   Radiology    Ct Angio Chest Pe W And/or Wo Contrast  Result Date: 04/08/2019 CLINICAL DATA:  Shortness of breath, right leg swelling EXAM: CT ANGIOGRAPHY CHEST WITH CONTRAST TECHNIQUE: Multidetector CT imaging of the chest was performed using the standard protocol during bolus administration of intravenous contrast. Multiplanar CT image reconstructions and MIPs were obtained to evaluate the vascular anatomy. CONTRAST:  173mL OMNIPAQUE IOHEXOL 350 MG/ML SOLN COMPARISON:  Same day chest x-ray FINDINGS: Cardiovascular: Postoperative changes from CABG. Cardiomegaly. No pericardial effusion. The central pulmonary vasculature is adequately opacified. Respiratory motion degrades evaluation of the  distal pulmonary arterial tree. No filling defect to the level of the lobar branch pulmonary arteries. Mediastinum/Nodes: Mildly prominent mediastinal and right hilar lymph nodes including a precarinal node measuring 10 mm short axis. Unremarkable thyroid gland. Trachea and esophagus within normal limits. Lungs/Pleura: Lungs are clear without focal airspace consolidation, pleural effusion, or pneumothorax. Upper Abdomen: No acute abnormality. Musculoskeletal: No chest wall abnormality. No acute or significant osseous findings. Review of the MIP images confirms the above findings. IMPRESSION: 1. Negative for pulmonary embolism to the lobar branch level. Respiratory motion degrades evaluation of the more distal pulmonary arterial tree. 2.  Lungs are clear. 3. Borderline prominent mediastinal and right hilar lymph nodes, nonspecific. 4. Cardiomegaly. Electronically Signed   By: Davina Poke M.D.   On: 04/08/2019 09:30   Dg Chest Portable 1 View  Result Date: 04/08/2019 CLINICAL DATA:  Shortness of breath EXAM: PORTABLE CHEST 1 VIEW COMPARISON:  10/22/2018 FINDINGS: Cardiomegaly with mild vascular prominence centrally. There has been CABG. No edema, consolidation, effusion, or pneumothorax. IMPRESSION: Cardiomegaly and mild vascular congestion. Electronically Signed   By: Monte Fantasia M.D.   On: 04/08/2019 06:38   Vas [place] Lower Extremity Venous (dvt) (only [facility])  Result Date: 04/08/2019  Lower Venous Study Indications: Edema.  Limitations: Body habitus and poor ultrasound/tissue interface. Comparison Study: no prior Performing Technologist: Abram Sander RVS  Examination Guidelines: A complete evaluation includes B-mode imaging, spectral Doppler, color Doppler, and power Doppler as needed of all accessible portions of each vessel. Bilateral testing is considered an integral part of a complete examination. Limited examinations for reoccurring indications may be performed as noted.  +---------+---------------+---------+-----------+----------+--------------+  RIGHT     Compressibility Phasicity Spontaneity Properties Summary         +---------+---------------+---------+-----------+----------+--------------+  CFV       Full            Yes       Yes                                    +---------+---------------+---------+-----------+----------+--------------+  SFJ       Full                                                             +---------+---------------+---------+-----------+----------+--------------+  FV Prox   Full                                                             +---------+---------------+---------+-----------+----------+--------------+  FV Mid                    Yes       Yes                                     +---------+---------------+---------+-----------+----------+--------------+  FV Distal                 Yes       Yes                                    +---------+---------------+---------+-----------+----------+--------------+  PFV       Full                                                             +---------+---------------+---------+-----------+----------+--------------+  POP       Full            Yes       Yes                                    +---------+---------------+---------+-----------+----------+--------------+  PTV       Full                                                             +---------+---------------+---------+-----------+----------+--------------+  PERO                                                       Not visualized  +---------+---------------+---------+-----------+----------+--------------+   +----+---------------+---------+-----------+----------+-------+  LEFT Compressibility Phasicity Spontaneity Properties Summary  +----+---------------+---------+-----------+----------+-------+  CFV  Full            Yes       Yes                             +----+---------------+---------+-----------+----------+-------+     Summary: Right: There is no evidence of deep vein thrombosis in the lower extremity. However, portions of this examination were limited- see technologist comments above. No cystic structure found in the popliteal fossa. Left: No evidence of common femoral vein obstruction.  *See table(s) above for measurements and observations. Electronically signed by Curt Jews MD on 04/08/2019 at 11:10:09 AM.    Final     Cardiac Studies   2D Echo 10/23/18 IMPRESSIONS   1. The left ventricle has normal systolic function, with an ejection fraction of 55-60%. The cavity size was normal. There is moderately increased left ventricular wall thickness. Left ventricular diastology could not be evaluated secondary to atrial  fibrillation. 2. The right ventricle has normal systolic  function. The cavity was normal. There is no increase in right ventricular wall thickness. 3. Left atrial size was severely dilated. 4. Right atrial size was moderately dilated. 5. The mitral valve is normal in structure. Moderate thickening of the mitral valve leaflet. No evidence of mitral valve stenosis. 6. The tricuspid valve is normal in structure. 7. The aortic valve is tricuspid Moderate thickening of the aortic valve mild stenosis of the aortic valve. 8. The aortic root is normal in size and structure. 9. Pulmonary hypertension is indeterminant, inadequate TR jet. 10. The inferior vena cava was dilated in size with >50% respiratory variability.  NST 10/23/18 Study Result   There was no ST segment deviation noted during stress.  The study is normal. There are no perfusion defects  This is a low risk study.  The left ventricular ejection  fraction is normal (55-65%).     Patient Profile     Leonard Cisneros is a 78 y.o. male with a hx of CAD (s/p CABG in 2003 with cath in 2017 showing patent LIMA-LAD, patent RIMA-dRCA, and 99% stenosis along SVG-RI treated with BMS, low risk NST 10/2018 ), chronic atrial fibrillation, chronic anticoagulation w/ coumadin, HTN, HLD and recent hip surgery 7/27, who is being seen today for the evaluation of acute CHF and atrial fibrillation w/ RVR, at the request of Dr. Erlinda Hong, Internal Medicine.   Assessment & Plan    1. Acute on Chronic Diastolic CHF: presented w/ volume overload w/ LEE. BNP 113.3. IV Lasix given. 40 mg BID. Good urinary response. -2.5L out yesterday. Net I/Os - 2.6 L. SCr and K stable. BP soft but stable. LEE improved but still present. No resting dyspnea. Continue IV Lasix for another day. Hopefully we can transition back to PO and D/c in the next 24 hrs.   2. Atrial Fibrillation w/ RVR: afib is chronic. He presented w/ RVR in the 110s and started on IV Cardizem. HR improved but still borderline tachy. Resting rate in the  90s-low 100s. He remains on IV cardizem. RVR likely in response to volume overload. Hopefully rate will continue to improve w/ diuresis. Prior to admit, he was on Cardizem 240 and metoprolol 75 mg daily. He may need further titration of PO meds before d/c. Continue coumadin for a/c.  3. Chronic Anticoagulation: on coumadin for afib. INR subtherapeutic at 1.4. Pharmacy assisting w/ dosing. Monitor H/H.   4. Anemia: further drop in Hgb, now at 7.9. Per review, he has had a steady decline in hgb since 2018, down from 14 >>7.9. MCV high at 109 c/w macrocytic anemia, however folate and B12 WNL. ? FOBT. Further management/ work up per primary.   5. Leukocytosis: unknown etiology. WBC ct now down trending w/o any antibiotics, down from 15>>12.  Afebrile. Discussed with IM attending this morning. No source of infection has been identified.   6. LEE: recent hip surgery and w/ subtherapeutic INR but DVT has been ruled out with negative venous dopplers. Edema 2/2 a/c diastolic CHF. Continue diureses.   For questions or updates, please contact [facility] Please consult [email or web address] for contact info under     Signed, Lyda Jester, PA-C  04/09/2019, 10:58 AM    History and all data above reviewed.  Patient examined.  I agree with the findings as above.  No acute SOB.  Mildly reduced left leg edema.  Chronic atrial fib and rate is still elevated.  The patient exam reveals GBT:[id number]  ,  Lungs: Clear  ,  Abd: Positive bowel sounds, no rebound no guarding, Ext Severe right leg edema  .  All available labs, radiology testing, previous records reviewed. Agree with documented assessment and plan. Edema:  Continue IV diuresis and leg elevation.  No evidence of an acute process other than the surgery.  Atrial fib:  Rate is elevated.  I would like to try to transition back to PO meds.    Shakeela Rabadan  1:40 PM  04/09/2019

## 2019-04-09 NOTE — Progress Notes (Signed)
PROGRESS NOTE    Leonard Cisneros  VOZ:[id number] DOB: [birth year] DOA: 04/08/2019 PCP: [name], MD   Brief Narrative:   H/o CABG,  Afib on coumadin, dCHF who was recently underwent right hip surgery by Dr [name] on 7/27. Returned to the ED due to above complaints. . He reports right thigh become swollen in the last 24-48hrs, he denies pain at rest, does has right sided pain with activity. She also reports feeling sob and fast heart rate. He denies fever, no chest pain. He denies orthopnea.   ED course: he is in afib/rvr, tmax 99.3, bp stable, no hypoxia, CTA negative for PE, right lower extremity US negative for DVT.  Wbc 15.8, hgb 9, cr 0.77, bnp 113, high sensitivity troponin is 10, INR 1.9. He is started on cardizem drip, hospitalist called due to patient is in afib/rvr needing cardizem drip.  Patient reports to me, he has been having bilateral lower extremity edema for a few months, some days are worse than others. He was in the hospital in 10/2018 for heart failure and  is started on lasix 40mg  daily. He reports does not use extra salt, he drink 6-8 glasses of water daily.   Assessment & Plan:   Active Problems:   CAD S/P percutaneous coronary angioplasty   Atrial fibrillation with RVR (HCC)   Acute on chronic diastolic CHF (congestive heart failure) (HCC)   Macrocytic anemia   Afib (HCC)   Afib/RVR     - cardizem gtt transitioned to PO; monitor     - continue coumadin (INR1.9 )      - hold oral betablocker/ccb while on cardizem drip     - cardiology consulted  Acute on chronic diastolic CHF     - cxr "Cardiomegaly and mild vascular congestion.", mild volume overload on exam     - IV lasix 40mg  BID; wean as able      - Fluids restriction      - cardiology consulted  Leukocytosis     - No fever, from recent surgery?     - UA negative     - WBC improving; no abx at this time; monitor  Right leg/thigh edema     - Negative for DVT     - He denies pain at  rest, no ecchymosis on exam, Monitor h/h     - Ortho Dr [name] consulted; no intervention at this time     - says his swelling is improved  Macrocytic Anemia      - hgb around 11 prior to surgery, hgb 10 on 7/28, hgb today is 9     - mcv chronically elevated but appear progressive from 102 to 109     - Hgb is down to 7.9 today; no evidence of frank bleed, continue to trend, consider CT ab/pelvis if he continues to drop     - B12/THF wnl  CAD s/p CABG     - No chest pain, tropnonin negative     - Continue home meds asa/statin/bb/imdur  DVT prophylaxis: coumadin Code Status: FULL Family Communication: None at bedside   Disposition Plan: TBD  Consultants:   Cardiology  Orthopedics    ROS:  Denies CP, dyspnea, leg pain, N/V, palpitations . Remainder 10-pt ROS is negative for all not previously mentioned.  Subjective: "I actually feel better."  Objective: Vitals:   04/09/19 1100 04/09/19 1158 04/09/19 1200 04/09/19 1355  BP: (!) 109/57  (!) 129/58 110/79  Pulse: (!) 114  [name])  101   Resp: 15  17   Temp:  98.6 F (37 C)    TempSrc:  Oral    SpO2: 94%  95%   Weight:      Height:        Intake/Output Summary (Last 24 hours) at 04/09/2019 1446 Last data filed at 04/09/2019 1100 Gross per 24 hour  Intake 622.57 ml  Output 2150 ml  Net -1527.43 ml   Filed Weights   04/08/19 0730 04/09/19 0500  Weight: 108 kg 107.1 kg    Examination:  General: 78 y.o. male resting in bed in NAD Eyes: PERRL, normal sclera ENMT: Nares patent w/o discharge, orophaynx clear, dentition normal, ears w/o discharge/lesions/ulcers Cardiovascular: irregular, tachy, +S1, S2, 1/6 SEM, no g/r, equal pulses throughout Respiratory: CTABL, no w/r/r, normal WOB GI: BS+, NDNT, no masses noted, no organomegaly noted MSK: BLE edema, worse on right, no c/c Skin: No rashes, bruises, ulcerations noted Neuro: A&O x 3, no focal deficits Psyc: Appropriate interaction and affect, calm/cooperative   Data  Reviewed: I have personally reviewed following labs and imaging studies.  CBC: Recent Labs  Lab 04/08/19 0543 04/09/19 0433  WBC 15.8* 12.5*  NEUTROABS 11.4*  --   HGB 9.0* 7.9*  HCT 28.1* 24.8*  MCV 109.3* 112.7*  PLT 251 660*   Basic Metabolic Panel: Recent Labs  Lab 04/08/19 0543 04/09/19 0433  NA 136 138  K 4.3 4.1  CL 102 103  CO2 22 25  GLUCOSE 138* 135*  BUN 18 16  CREATININE 0.77 0.84  CALCIUM 8.7* 8.7*  MG  --  2.0   GFR: Estimated Creatinine Clearance: 91.7 mL/min (by C-G formula based on SCr of 0.84 mg/dL). Liver Function Tests: No results for input(s): AST, ALT, ALKPHOS, BILITOT, PROT, ALBUMIN in the last 168 hours. No results for input(s): LIPASE, AMYLASE in the last 168 hours. No results for input(s): AMMONIA in the last 168 hours. Coagulation Profile: Recent Labs  Lab 04/08/19 0543 04/09/19 0433  INR 1.9* 1.4*   Cardiac Enzymes: No results for input(s): CKTOTAL, CKMB, CKMBINDEX, TROPONINI in the last 168 hours. BNP (last 3 results) No results for input(s): PROBNP in the last 8760 hours. HbA1C: No results for input(s): HGBA1C in the last 72 hours. CBG: No results for input(s): GLUCAP in the last 168 hours. Lipid Profile: No results for input(s): CHOL, HDL, LDLCALC, TRIG, CHOLHDL, LDLDIRECT in the last 72 hours. Thyroid Function Tests: Recent Labs    04/09/19 0433  TSH 1.321   Anemia Panel: Recent Labs    04/09/19 0433  VITAMINB12 338  FOLATE 8.9   Sepsis Labs: No results for input(s): PROCALCITON, LATICACIDVEN in the last 168 hours.  Recent Results (from the past 240 hour(s))  SARS CORONAVIRUS 2 Nasal Swab Aptima Multi Swab     Status: None   Collection Time: 04/08/19  5:43 AM   Specimen: Aptima Multi Swab; Nasal Swab  Result Value Ref Range Status   SARS Coronavirus 2 NEGATIVE NEGATIVE Final    Comment: (NOTE) SARS-CoV-2 target nucleic acids are NOT DETECTED. The SARS-CoV-2 RNA is generally detectable in upper and  lower respiratory specimens during the acute phase of infection. Negative results do not preclude SARS-CoV-2 infection, do not rule out co-infections with other pathogens, and should not be used as the sole basis for treatment or other patient management decisions. Negative results must be combined with clinical observations, patient history, and epidemiological information. The expected result is Negative. Fact Sheet for Patients: SugarRoll.be Fact Sheet  for Healthcare Providers: https://www.woods-mathews.com/ This test is not yet approved or cleared by the [place] and  has been authorized for detection and/or diagnosis of SARS-CoV-2 by FDA under an Emergency Use Authorization (EUA). This EUA will remain  in effect (meaning this test can be used) for the duration of the COVID-19 declaration under Section 56 4(b)(1) of the Act, 21 U.S.C. section 360bbb-3(b)(1), unless the authorization is terminated or revoked sooner. Performed at [facility], [place] [address], [place], [place] [address]   MRSA PCR Screening     Status: None   Collection Time: 04/08/19  7:04 PM   Specimen: Nasopharyngeal  Result Value Ref Range Status   MRSA by PCR NEGATIVE NEGATIVE Final    Comment:        The GeneXpert MRSA Assay (FDA approved for NASAL specimens only), is one component of a comprehensive MRSA colonization surveillance program. It is not intended to diagnose MRSA infection nor to guide or monitor treatment for MRSA infections. Performed at [facility], [place] [address] [place], [place] [address]       Radiology Studies: Ct Angio Chest Pe W And/or Wo Contrast  Result Date: 04/08/2019 CLINICAL DATA:  Shortness of breath, right leg swelling EXAM: CT ANGIOGRAPHY CHEST WITH CONTRAST TECHNIQUE: Multidetector CT imaging of the chest was performed using the standard protocol during bolus administration of intravenous  contrast. Multiplanar CT image reconstructions and MIPs were obtained to evaluate the vascular anatomy. CONTRAST:  190mL OMNIPAQUE IOHEXOL 350 MG/ML SOLN COMPARISON:  Same day chest x-ray FINDINGS: Cardiovascular: Postoperative changes from CABG. Cardiomegaly. No pericardial effusion. The central pulmonary vasculature is adequately opacified. Respiratory motion degrades evaluation of the distal pulmonary arterial tree. No filling defect to the level of the lobar branch pulmonary arteries. Mediastinum/Nodes: Mildly prominent mediastinal and right hilar lymph nodes including a precarinal node measuring 10 mm short axis. Unremarkable thyroid gland. Trachea and esophagus within normal limits. Lungs/Pleura: Lungs are clear without focal airspace consolidation, pleural effusion, or pneumothorax. Upper Abdomen: No acute abnormality. Musculoskeletal: No chest wall abnormality. No acute or significant osseous findings. Review of the MIP images confirms the above findings. IMPRESSION: 1. Negative for pulmonary embolism to the lobar branch level. Respiratory motion degrades evaluation of the more distal pulmonary arterial tree. 2. Lungs are clear. 3. Borderline prominent mediastinal and right hilar lymph nodes, nonspecific. 4. Cardiomegaly. Electronically Signed   By: Davina Poke M.D.   On: 04/08/2019 09:30   Dg Chest Portable 1 View  Result Date: 04/08/2019 CLINICAL DATA:  Shortness of breath EXAM: PORTABLE CHEST 1 VIEW COMPARISON:  10/22/2018 FINDINGS: Cardiomegaly with mild vascular prominence centrally. There has been CABG. No edema, consolidation, effusion, or pneumothorax. IMPRESSION: Cardiomegaly and mild vascular congestion. Electronically Signed   By: Monte Fantasia M.D.   On: 04/08/2019 06:38   Vas [place] Lower Extremity Venous (dvt) (only Mc & [facility])  Result Date: 04/08/2019  Lower Venous Study Indications: Edema.  Limitations: Body habitus and poor ultrasound/tissue interface. Comparison Study: no prior  Performing Technologist: Abram Sander RVS  Examination Guidelines: A complete evaluation includes B-mode imaging, spectral Doppler, color Doppler, and power Doppler as needed of all accessible portions of each vessel. Bilateral testing is considered an integral part of a complete examination. Limited examinations for reoccurring indications may be performed as noted.  +---------+---------------+---------+-----------+----------+--------------+  RIGHT     Compressibility Phasicity Spontaneity Properties Summary         +---------+---------------+---------+-----------+----------+--------------+  CFV  Full            Yes       Yes                                    +---------+---------------+---------+-----------+----------+--------------+  SFJ       Full                                                             +---------+---------------+---------+-----------+----------+--------------+  FV Prox   Full                                                             +---------+---------------+---------+-----------+----------+--------------+  FV Mid                    Yes       Yes                                    +---------+---------------+---------+-----------+----------+--------------+  FV Distal                 Yes       Yes                                    +---------+---------------+---------+-----------+----------+--------------+  PFV       Full                                                             +---------+---------------+---------+-----------+----------+--------------+  POP       Full            Yes       Yes                                    +---------+---------------+---------+-----------+----------+--------------+  PTV       Full                                                             +---------+---------------+---------+-----------+----------+--------------+  PERO                                                       Not visualized   +---------+---------------+---------+-----------+----------+--------------+   +----+---------------+---------+-----------+----------+-------+  LEFT Compressibility Phasicity Spontaneity Properties Summary  +----+---------------+---------+-----------+----------+-------+  CFV  Full            Yes       Yes                             +----+---------------+---------+-----------+----------+-------+     Summary: Right: There is no evidence of deep vein thrombosis in the lower extremity. However, portions of this examination were limited- see technologist comments above. No cystic structure found in the popliteal fossa. Left: No evidence of common femoral vein obstruction.  *See table(s) above for measurements and observations. Electronically signed by Curt Jews MD on 04/08/2019 at 11:10:09 AM.    Final      Scheduled Meds:  atorvastatin  80 mg Oral Daily   Chlorhexidine Gluconate Cloth  6 each Topical Q0600   diltiazem  60 mg Oral Q8H   furosemide  40 mg Intravenous BID   warfarin  10 mg Oral ONCE-1800   Warfarin - Pharmacist Dosing Inpatient   Does not apply q1800   Continuous Infusions:   LOS: 0 days    Time spent: 25 minutes spent in the coordination of care today.   Jonnie Finner, DO [facility] Pager [phone]  If 7PM-7AM, please contact night-coverage [email or web address] Password [email or web address] 04/09/2019, 2:46 PM

## 2019-04-10 ENCOUNTER — Inpatient Hospital Stay (HOSPITAL_COMMUNITY): Payer: Medicare HMO

## 2019-04-10 LAB — CBC WITH DIFFERENTIAL/PLATELET
Abs Immature Granulocytes: 0.26 10*3/uL — ABNORMAL HIGH (ref 0.00–0.07)
Basophils Absolute: 0 10*3/uL (ref 0.0–0.1)
Basophils Relative: 0 %
Eosinophils Absolute: 0.6 10*3/uL — ABNORMAL HIGH (ref 0.0–0.5)
Eosinophils Relative: 5 %
HCT: 22.9 % — ABNORMAL LOW (ref 39.0–52.0)
Hemoglobin: 7.5 g/dL — ABNORMAL LOW (ref 13.0–17.0)
Immature Granulocytes: 2 %
Lymphocytes Relative: 10 %
Lymphs Abs: 1.2 10*3/uL (ref 0.7–4.0)
MCH: 35.9 pg — ABNORMAL HIGH (ref 26.0–34.0)
MCHC: 32.8 g/dL (ref 30.0–36.0)
MCV: 109.6 fL — ABNORMAL HIGH (ref 80.0–100.0)
Monocytes Absolute: 1 10*3/uL (ref 0.1–1.0)
Monocytes Relative: 8 %
Neutro Abs: 8.7 10*3/uL — ABNORMAL HIGH (ref 1.7–7.7)
Neutrophils Relative %: 75 %
Platelets: 208 10*3/uL (ref 150–400)
RBC: 2.09 MIL/uL — ABNORMAL LOW (ref 4.22–5.81)
RDW: 15.4 % (ref 11.5–15.5)
WBC: 11.8 10*3/uL — ABNORMAL HIGH (ref 4.0–10.5)
nRBC: 0 % (ref 0.0–0.2)

## 2019-04-10 LAB — RENAL FUNCTION PANEL
Albumin: 3.2 g/dL — ABNORMAL LOW (ref 3.5–5.0)
Anion gap: 12 (ref 5–15)
BUN: 18 mg/dL (ref 8–23)
CO2: 25 mmol/L (ref 22–32)
Calcium: 8.4 mg/dL — ABNORMAL LOW (ref 8.9–10.3)
Chloride: 100 mmol/L (ref 98–111)
Creatinine, Ser: 0.86 mg/dL (ref 0.61–1.24)
GFR calc Af Amer: >60 mL/min (ref >60)
GFR calc non Af Amer: >60 mL/min (ref >60)
Glucose, Bld: 118 mg/dL — ABNORMAL HIGH (ref 70–99)
Phosphorus: 3 mg/dL (ref 2.5–4.6)
Potassium: 3.6 mmol/L (ref 3.5–5.1)
Sodium: 137 mmol/L (ref 135–145)

## 2019-04-10 LAB — PROTIME-INR
INR: 1.4 — ABNORMAL HIGH (ref 0.8–1.2)
Prothrombin Time: 16.7 seconds — ABNORMAL HIGH (ref 11.4–15.2)

## 2019-04-10 MED ORDER — WARFARIN SODIUM 5 MG PO TABS
7.5000 mg | ORAL_TABLET | Freq: Once | ORAL | Status: AC
  Administered 2019-04-10: 7.5 mg via ORAL
  Filled 2019-04-10: qty 1

## 2019-04-10 MED ORDER — SODIUM CHLORIDE (PF) 0.9 % IJ SOLN
INTRAMUSCULAR | Status: AC
  Filled 2019-04-10: qty 50

## 2019-04-10 MED ORDER — CHLORHEXIDINE GLUCONATE CLOTH 2 % EX PADS
6.0000 | MEDICATED_PAD | Freq: Every day | CUTANEOUS | Status: DC
  Administered 2019-04-10: 08:00:00 6 via TOPICAL

## 2019-04-10 MED ORDER — IOHEXOL 300 MG/ML  SOLN
100.0000 mL | Freq: Once | INTRAMUSCULAR | Status: AC | PRN
  Administered 2019-04-10: 17:00:00 100 mL via INTRAVENOUS

## 2019-04-10 NOTE — Progress Notes (Addendum)
Progress Note  Patient Name: Leonard Cisneros Date of Encounter: 04/10/2019  Primary Cardiologist: [name], MD   Subjective   No complaints. No dyspnea, CP or palpitations. LEE gradually improving.   Inpatient Medications    Scheduled Meds: . atorvastatin  80 mg Oral Daily  . Chlorhexidine Gluconate Cloth  6 each Topical Q0600  . diltiazem  60 mg Oral Q8H  . furosemide  40 mg Intravenous BID  . warfarin  7.5 mg Oral ONCE-1800  . Warfarin - Pharmacist Dosing Inpatient   Does not apply q1800   Continuous Infusions:  PRN Meds: acetaminophen, HYDROcodone-acetaminophen   Vital Signs    Vitals:   04/10/19 0700 04/10/19 0800 04/10/19 0806 04/10/19 0900  BP: (!) 106/39  132/61 123/66  Pulse: 80 (!) 104 (!) 115 (!) 128  Resp: 16 (!) 23 18 18   Temp:      TempSrc:      SpO2: 95% 100% 97% 95%  Weight:      Height:        Intake/Output Summary (Last 24 hours) at 04/10/2019 0942 Last data filed at 04/10/2019 0804 Gross per 24 hour  Intake 44.63 ml  Output 900 ml  Net -855.37 ml   Last 3 Weights 04/10/2019 04/09/2019 04/08/2019  Weight (lbs) 236 lb 8.9 oz 236 lb 1.8 oz 238 lb 1 oz  Weight (kg) 107.3 kg 107.1 kg 107.984 kg      Telemetry    Atrial fibrillation, low 100s-110s - Personally Reviewed  ECG    Not performed  - Personally Reviewed  Physical Exam   GEN: No acute distress.   Neck: No JVD Cardiac: irregularly irregular rhythm, mildly tachy rate, no murmurs, rubs, or gallops.  Respiratory: Clear to auscultation bilaterally. GI: Soft, nontender, non-distended  MS: trace - 1 + bilateral LE pitting edema; No deformity. Neuro:  Nonfocal  Psych: Normal affect   Labs    High Sensitivity Troponin:   Recent Labs  Lab 04/08/19 0543  TROPONINIHS 10      Cardiac EnzymesNo results for input(s): TROPONINI in the last 168 hours. No results for input(s): TROPIPOC in the last 168 hours.   Chemistry Recent Labs  Lab 04/08/19 0543 04/09/19 0433 04/10/19 0758   NA 136 138 137  K 4.3 4.1 3.6  CL 102 103 100  CO2 22 25 25   GLUCOSE 138* 135* 118*  BUN 18 16 18   CREATININE 0.77 0.84 0.86  CALCIUM 8.7* 8.7* 8.4*  ALBUMIN  --   --  3.2*  GFRNONAA >60 >60 >60  GFRAA >60 >60 >60  ANIONGAP 12 10 12      Hematology Recent Labs  Lab 04/08/19 0543 04/09/19 0433 04/10/19 0758  WBC 15.8* 12.5* 11.8*  RBC 2.57* 2.20* 2.09*  HGB 9.0* 7.9* 7.5*  HCT 28.1* 24.8* 22.9*  MCV 109.3* 112.7* 109.6*  MCH 35.0* 35.9* 35.9*  MCHC 32.0 31.9 32.8  RDW 15.1 15.1 15.4  PLT 251 407* 208    BNP Recent Labs  Lab 04/08/19 0543  BNP 113.3*     DDimer No results for input(s): DDIMER in the last 168 hours.   Radiology    No results found.  Cardiac Studies   2D Echo 10/23/18 IMPRESSIONS   1. The left ventricle has normal systolic function, with an ejection fraction of 55-60%. The cavity size was normal. There is moderately increased left ventricular wall thickness. Left ventricular diastology could not be evaluated secondary to atrial  fibrillation. 2. The right ventricle has  normal systolic function. The cavity was normal. There is no increase in right ventricular wall thickness. 3. Left atrial size was severely dilated. 4. Right atrial size was moderately dilated. 5. The mitral valve is normal in structure. Moderate thickening of the mitral valve leaflet. No evidence of mitral valve stenosis. 6. The tricuspid valve is normal in structure. 7. The aortic valve is tricuspid Moderate thickening of the aortic valve mild stenosis of the aortic valve. 8. The aortic root is normal in size and structure. 9. Pulmonary hypertension is indeterminant, inadequate TR jet. 10. The inferior vena cava was dilated in size with >50% respiratory variability.  NST 10/23/18 Study Result   There was no ST segment deviation noted during stress.  The study is normal. There are no perfusion defects  This is a low risk study.  The left ventricular  ejection fraction is normal (55-65%).     Patient Profile     Alison B Pruittis a 78 y.o.malewith a hx of CAD (s/p CABG in 2003 with cath in 2017 showing patent LIMA-LAD, patent RIMA-dRCA, and 99% stenosis along SVG-RI treated with BMS, low risk NST 10/2018), chronic atrial fibrillation,chronic anticoagulation w/ coumadin,HTN, HLD and recent hip surgery 7/27,who is being seen today for the evaluation ofacute CHF and atrial fibrillation w/ RVR,at the request of Dr. Erlinda Hong, Internal Medicine.   Assessment & Plan    1. Acute on Chronic Diastolic CHF: presented w/ volume overload w/ LEE. BNP 113.3.   IV Lasix Given w/ good response  Additional 1L out yesterday in UOP. Net U/Os negative 3.2L  Scr stable, 0.86  K WNL, 3.6  No dypnea  LEE improved but still present, trace- 1+ bilateral LE pitting edema.   He received another dose of IV Lasix this morning  Plan transition to PO Lasix in the next 24 hrs  Continue stritc I/Os and close monitoring of renal function and electrolytes  2. Atrial Fibrillation w/ RVR: afib is chronic. He presented w/ RVR in the 110s and started on IV Cardizem.  Rates still mildly tachy in the low 100s-110s but fairly asymptomatic.   Currently getting short acting PO Cardizem, 60 mg Q8H; was on Cardizem CD 240 and Toprol XL 75 mg prior to admit   SBP has been a bit soft, in the low 100s-110s.  Hopefully, once we transition off of IV diuretics and back to PO, there will be more BP room to resume and uptitrate his home AV nodal blocking agents for better rate control  Continue coumadin for a/c.  3. Leukocytosis: 11.8 today  Improving w/o antibiotics  No source of infection identified   No further w/u  4. Anemia: further drop in Hgb, now at 7.5.   Per review, he has had a steady decline in hgb since 2018, down from 14 >>7.9. MCV high at 109 c/w macrocytic anemia, however folate and B12 WNL.   He denies melena but ? FOBT.   Further  management/ work up per primary.   5. Chronic Anticoagulation: coumadin for afib  INR is subtherapeutic at 1.4  Pharmacy to dose  6. LEE: recent hip surgery and w/ subtherapeutic INR but DVT has been ruled out with negative venous dopplers.  Edema 2/2 a/c diastolic HF  Improving w/ Lasix    For questions or updates, please contact [place] Please consult [email or web address] for contact info under     Signed, Lyda Jester, PA-C  04/10/2019, 9:42 AM    History and all data above reviewed.  Patient  examined.  I agree with the findings as above.  He feels OK.  Wants to go home.   The patient exam reveals PBA:[id number]  ,  Lungs: Clear  ,  Abd: Positive bowel sounds, no rebound no guarding, Ext  Mild left leg edema  .  All available labs, radiology testing, previous records reviewed. Agree with documented assessment and plan.   Atrial fib:  I think he could go home when OK with primary team.  I would hold his beta blocker at discharge but start the Cardizem CD 240.  I would suggest lasix 40 mg bid with 20 meq KDur.  We will arrange follow up TOC visit in our clinic.  He will need follow up to specifically be arranged for his anemia as we will not be following this work up specifically.    Minus Breeding  10:51 AM  04/10/2019

## 2019-04-10 NOTE — Progress Notes (Signed)
[place] for warfarin Indication: hx atrial fibrillation  Allergies  Allergen Reactions  . Augmentin [Amoxicillin-Pot Clavulanate] Nausea And Vomiting and Other (See Comments)    Has patient had a PCN reaction causing immediate rash, facial/tongue/throat swelling, SOB or lightheadedness with hypotension: Unknown Has patient had a PCN reaction causing severe rash involving mucus membranes or skin necrosis: No Has patient had a PCN reaction that required hospitalization: No Has patient had a PCN reaction occurring within the last 10 years: No If all of the above answers are "NO", then may proceed with Cephalosporin use.     Patient Measurements: Height: 5\' 11"  (180.3 cm) Weight: 236 lb 8.9 oz (107.3 kg) IBW/kg (Calculated) : 75.3 Heparin Dosing Weight:   Vital Signs: Temp: 98.1 F (36.7 C) (08/07 0306) Temp Source: Oral (08/07 0306) BP: 132/61 (08/07 0806) Pulse Rate: 115 (08/07 0806)  Labs: Recent Labs    04/08/19 0543 04/09/19 0433 04/10/19 0208 04/10/19 0758  HGB 9.0* 7.9*  --  7.5*  HCT 28.1* 24.8*  --  22.9*  PLT 251 407*  --  208  LABPROT 21.2* 16.6* 16.7*  --   INR 1.9* 1.4* 1.4*  --   CREATININE 0.77 0.84  --  0.86  TROPONINIHS 10  --   --   --     Estimated Creatinine Clearance: 89.6 mL/min (by C-G formula based on SCr of 0.86 mg/dL).   Medications:  - PTA warfarin regimen: home dose: 7.5 mg daily except 3.75 mg on Mon and Fri per [facility] clinic note on 8/3  Assessment: [name] a 78 y.o M with hx recent right THA revision on 7/27 (discharge on 7/28), heart failure, and chronic afib on warfarin PTA, presented to the ED on 8/5 with c/o SOB and LE swelling.  Chest CTA was negative for PE and LE doppler negative for DVT.  Warfarin resumed on admission.  Today, 04/10/2019: - INR is remains subtherapeutic at 1.4 - hgb down 7.5, plts 208 - no bleeding documented - no significant drug-drug intxns - on cardiac diet  Goal of  Therapy:  INR 2-3 Monitor platelets by anticoagulation protocol: Yes   Plan:  - Warfarin 7.5 mg PO x1 (double regular home dose) - daily INR - monitor for s/s bleeding  Leonard Cisneros P 04/10/2019,9:13 AM

## 2019-04-10 NOTE — Progress Notes (Signed)
[name]  PROGRESS NOTE    KENTRAIL SHEW  KZS:[id number] DOB: 1941-09-01 DOA: 04/08/2019 PCP: [name], MD   Brief Narrative:   H/o CABG, Afib on coumadin, dCHF who was recently underwent right hip surgery by Dr [name] on 7/27. Returned to the ED due to above complaints. . He reports right thigh become swollen in the last 24-48hrs, he denies pain at rest, does has right sided pain with activity. She also reports feeling sob and fast heart rate.He denies fever, no chest pain. He denies orthopnea.  ED course: he is in afib/rvr, tmax 99.3, bp stable, no hypoxia, CTA negative for PE, right lower extremity US negative for DVT. Wbc 15.8, hgb 9, cr 0.77, bnp 113, high sensitivity troponin is 10, INR 1.9. He is started on cardizem drip, hospitalist called due to patient is in afib/rvr needing cardizem drip.  Patient reports to me, he has been having bilateral lower extremity edema for a few months, some days are worse than others. He was in the hospital in 10/2018 for heart failure and is started on lasix 40mg  daily. He reports does not use extra salt, he drink 6-8 glasses of water daily.   Assessment & Plan:   Active Problems:   CAD S/P percutaneous coronary angioplasty   Atrial fibrillation with RVR (HCC)   Acute on chronic diastolic CHF (congestive heart failure) (HCC)   Macrocytic anemia   Afib (HCC)   Afib/RVR     - cardizem gtt transitioned to PO; monitor     - continue coumadin (INR1.9 )      - hold oral betablocker/ccb while on cardizem drip     - cardiology consulted  Acute on chronic diastolic CHF     - cxr "Cardiomegaly and mild vascular congestion.", mild volume overload on exam     - IV lasix 40mg  BID; wean as able      - Fluids restriction      - cardiology consulted  Leukocytosis     - No fever, from recent surgery?     - UA negative     - WBC improving; no abx at this time; monitor  Right leg/thigh edema     - Negative for DVT     - He denies pain at  rest, no ecchymosis on exam, Monitor h/h     - Ortho Dr [name] consulted; no intervention at this time     - says his swelling is improved  Macrocytic Anemia      - hgb around 11 prior to surgery, hgb 10 on 7/28, hgb today is 9     - mcv chronically elevated but appear progressive from 102 to 109     - Hgb is down to 7.9; no evidence of frank bleed, continue to trend, consider CT ab/pelvis if he continues to drop     - B12/THF wnl     - Hgb is down to 7.5.; will check CT ab/pelvis to look for bleed  CAD s/p CABG     - No chest pain, tropnonin negative     - Continue home meds asa/statin/bb/imdur  Hgb is still going down. I don't have any evidence of frank bleed. Will get ct ab/pelvis to look for bleed. Also check hemoccult. HR better. Now on orals. Can TTF - telemetry  DVT prophylaxis: coumadin Code Status: FULL Family Communication: None at bedside   Disposition Plan: TBD  Consultants:   Cardiology  Orthopedics  ROS:  Denies CP, palpitations, abdominal  pain. Reports improved thigh tightness. Remainder 10-pt ROS is negative for all not previously mentioned.  Subjective: "I don't feel jittery."  Objective: Vitals:   04/10/19 1000 04/10/19 1100 04/10/19 1200 04/10/19 1300  BP: 112/60 132/79 140/66 (!) 114/50  Pulse: (!) 119 (!) 119 (!) 106 67  Resp: 19 16 20 13   Temp:   98.5 F (36.9 C)   TempSrc:   Oral   SpO2: 97% 95% 97% 98%  Weight:      Height:        Intake/Output Summary (Last 24 hours) at 04/10/2019 1311 Last data filed at 04/10/2019 1035 Gross per 24 hour  Intake 262.11 ml  Output 1325 ml  Net -1062.89 ml   Filed Weights   04/08/19 0730 04/09/19 0500 04/10/19 0245  Weight: 108 kg 107.1 kg 107.3 kg    Examination:  General: 78 y.o. male resting in bed in NAD Eyes: PERRL, normal sclera ENMT: Nares patent w/o discharge, orophaynx clear, dentition normal, ears w/o discharge/lesions/ulcers Cardiovascular: irregular, +S1, S2, 1/6 SEM, no g/r, equal  pulses throughout Respiratory: CTABL, no w/r/r, normal WOB GI: BS+, NDNT, no masses noted, no organomegaly noted MSK: BLE edema, worse on right; although it is improving, no c/c Skin: No rashes, bruises, ulcerations noted Neuro: A&O x 3, no focal deficits Psyc: Appropriate interaction and affect, calm/cooperative   Data Reviewed: I have personally reviewed following labs and imaging studies.  CBC: Recent Labs  Lab 04/08/19 0543 04/09/19 0433 04/10/19 0758  WBC 15.8* 12.5* 11.8*  NEUTROABS 11.4*  --  8.7*  HGB 9.0* 7.9* 7.5*  HCT 28.1* 24.8* 22.9*  MCV 109.3* 112.7* 109.6*  PLT 251 407* 680   Basic Metabolic Panel: Recent Labs  Lab 04/08/19 0543 04/09/19 0433 04/10/19 0758  NA 136 138 137  K 4.3 4.1 3.6  CL 102 103 100  CO2 22 25 25   GLUCOSE 138* 135* 118*  BUN 18 16 18   CREATININE 0.77 0.84 0.86  CALCIUM 8.7* 8.7* 8.4*  MG  --  2.0  --   PHOS  --   --  3.0   GFR: Estimated Creatinine Clearance: 89.6 mL/min (by C-G formula based on SCr of 0.86 mg/dL). Liver Function Tests: Recent Labs  Lab 04/10/19 0758  ALBUMIN 3.2*   No results for input(s): LIPASE, AMYLASE in the last 168 hours. No results for input(s): AMMONIA in the last 168 hours. Coagulation Profile: Recent Labs  Lab 04/08/19 0543 04/09/19 0433 04/10/19 0208  INR 1.9* 1.4* 1.4*   Cardiac Enzymes: No results for input(s): CKTOTAL, CKMB, CKMBINDEX, TROPONINI in the last 168 hours. BNP (last 3 results) No results for input(s): PROBNP in the last 8760 hours. HbA1C: No results for input(s): HGBA1C in the last 72 hours. CBG: No results for input(s): GLUCAP in the last 168 hours. Lipid Profile: No results for input(s): CHOL, HDL, LDLCALC, TRIG, CHOLHDL, LDLDIRECT in the last 72 hours. Thyroid Function Tests: Recent Labs    04/09/19 0433  TSH 1.321   Anemia Panel: Recent Labs    04/09/19 0433  VITAMINB12 338  FOLATE 8.9   Sepsis Labs: No results for input(s): PROCALCITON, LATICACIDVEN  in the last 168 hours.  Recent Results (from the past 240 hour(s))  SARS CORONAVIRUS 2 Nasal Swab Aptima Multi Swab     Status: None   Collection Time: 04/08/19  5:43 AM   Specimen: Aptima Multi Swab; Nasal Swab  Result Value Ref Range Status   SARS Coronavirus 2 NEGATIVE NEGATIVE Final  Comment: (NOTE) SARS-CoV-2 target nucleic acids are NOT DETECTED. The SARS-CoV-2 RNA is generally detectable in upper and lower respiratory specimens during the acute phase of infection. Negative results do not preclude SARS-CoV-2 infection, do not rule out co-infections with other pathogens, and should not be used as the sole basis for treatment or other patient management decisions. Negative results must be combined with clinical observations, patient history, and epidemiological information. The expected result is Negative. Fact Sheet for Patients: SugarRoll.be Fact Sheet for Healthcare Providers: https://www.woods-mathews.com/ This test is not yet approved or cleared by the [place] FDA and  has been authorized for detection and/or diagnosis of SARS-CoV-2 by FDA under an Emergency Use Authorization (EUA). This EUA will remain  in effect (meaning this test can be used) for the duration of the COVID-19 declaration under Section 56 4(b)(1) of the Act, 21 U.S.C. section 360bbb-3(b)(1), unless the authorization is terminated or revoked sooner. Performed at [facility], [place] [address] [place], [place] [address]   MRSA PCR Screening     Status: None   Collection Time: 04/08/19  7:04 PM   Specimen: Nasopharyngeal  Result Value Ref Range Status   MRSA by PCR NEGATIVE NEGATIVE Final    Comment:        The GeneXpert MRSA Assay (FDA approved for NASAL specimens only), is one component of a comprehensive MRSA colonization surveillance program. It is not intended to diagnose MRSA infection nor to guide or monitor treatment for MRSA  infections. Performed at [facility], [place] [address], [place], [place] [address]   Culture, blood (routine x 2)     Status: None (Preliminary result)   Collection Time: 04/09/19  3:33 PM   Specimen: BLOOD  Result Value Ref Range Status   Specimen Description   Final    BLOOD LEFT ARM Performed at [facility] [address], [place], [place] [address]    Special Requests   Final    BOTTLES DRAWN AEROBIC AND ANAEROBIC Blood Culture adequate volume Performed at [facility] [address], [place], [place] [address]    Culture   Final    NO GROWTH < 12 HOURS Performed at [facility] [address] [place], [place] [address]    Report Status PENDING  Incomplete  Culture, blood (routine x 2)     Status: None (Preliminary result)   Collection Time: 04/09/19  3:33 PM   Specimen: BLOOD  Result Value Ref Range Status   Specimen Description   Final    BLOOD RIGHT HAND Performed at [facility] [address], [place], [place] [address]    Special Requests   Final    BOTTLES DRAWN AEROBIC ONLY Blood Culture results may not be optimal due to an inadequate volume of blood received in culture bottles Performed at [facility] [address] [place], [place] [address]    Culture   Final    NO GROWTH < 12 HOURS Performed at [facility] [address], [place],  [address]    Report Status PENDING  Incomplete      Radiology Studies: No results found.   Scheduled Meds: . atorvastatin  80 mg Oral Daily  . Chlorhexidine Gluconate Cloth  6 each Topical Q0600  . diltiazem  60 mg Oral Q8H  . furosemide  40 mg Intravenous BID  . warfarin  7.5 mg Oral ONCE-1800  . Warfarin - Pharmacist Dosing Inpatient  Does not apply q1800   Continuous Infusions:   LOS: 1 day    Time spent: 35 minutes spent in the coordination of care today.   Jonnie Finner, DO [facility] Pager [phone]  If 7PM-7AM, please contact night-coverage [email or web address] Password [email or web address] 04/10/2019, 1:11 PM

## 2019-04-11 DIAGNOSIS — I5033 Acute on chronic diastolic (congestive) heart failure: Secondary | ICD-10-CM

## 2019-04-11 DIAGNOSIS — I4891 Unspecified atrial fibrillation: Secondary | ICD-10-CM

## 2019-04-11 LAB — RENAL FUNCTION PANEL
Albumin: 3.2 g/dL — ABNORMAL LOW (ref 3.5–5.0)
Anion gap: 11 (ref 5–15)
BUN: 16 mg/dL (ref 8–23)
CO2: 25 mmol/L (ref 22–32)
Calcium: 8.3 mg/dL — ABNORMAL LOW (ref 8.9–10.3)
Chloride: 99 mmol/L (ref 98–111)
Creatinine, Ser: 0.82 mg/dL (ref 0.61–1.24)
GFR calc Af Amer: >60 mL/min (ref >60)
GFR calc non Af Amer: >60 mL/min (ref >60)
Glucose, Bld: 108 mg/dL — ABNORMAL HIGH (ref 70–99)
Phosphorus: 3.3 mg/dL (ref 2.5–4.6)
Potassium: 3.6 mmol/L (ref 3.5–5.1)
Sodium: 135 mmol/L (ref 135–145)

## 2019-04-11 LAB — CBC WITH DIFFERENTIAL/PLATELET
Abs Immature Granulocytes: 0.28 10*3/uL — ABNORMAL HIGH (ref 0.00–0.07)
Basophils Absolute: 0 10*3/uL (ref 0.0–0.1)
Basophils Relative: 0 %
Eosinophils Absolute: 0.7 10*3/uL — ABNORMAL HIGH (ref 0.0–0.5)
Eosinophils Relative: 6 %
HCT: 23.2 % — ABNORMAL LOW (ref 39.0–52.0)
Hemoglobin: 7.3 g/dL — ABNORMAL LOW (ref 13.0–17.0)
Immature Granulocytes: 3 %
Lymphocytes Relative: 12 %
Lymphs Abs: 1.3 10*3/uL (ref 0.7–4.0)
MCH: 34.8 pg — ABNORMAL HIGH (ref 26.0–34.0)
MCHC: 31.5 g/dL (ref 30.0–36.0)
MCV: 110.5 fL — ABNORMAL HIGH (ref 80.0–100.0)
Monocytes Absolute: 1.1 10*3/uL — ABNORMAL HIGH (ref 0.1–1.0)
Monocytes Relative: 10 %
Neutro Abs: 7.8 10*3/uL — ABNORMAL HIGH (ref 1.7–7.7)
Neutrophils Relative %: 69 %
Platelets: 226 10*3/uL (ref 150–400)
RBC: 2.1 MIL/uL — ABNORMAL LOW (ref 4.22–5.81)
RDW: 15.7 % — ABNORMAL HIGH (ref 11.5–15.5)
WBC: 11.2 10*3/uL — ABNORMAL HIGH (ref 4.0–10.5)
nRBC: 0.2 % (ref 0.0–0.2)

## 2019-04-11 LAB — OCCULT BLOOD X 1 CARD TO LAB, STOOL: Fecal Occult Bld: NEGATIVE

## 2019-04-11 LAB — PROTIME-INR
INR: 1.5 — ABNORMAL HIGH (ref 0.8–1.2)
Prothrombin Time: 17.5 seconds — ABNORMAL HIGH (ref 11.4–15.2)

## 2019-04-11 LAB — HEMOGLOBIN AND HEMATOCRIT, BLOOD
HCT: 27.3 % — ABNORMAL LOW (ref 39.0–52.0)
Hemoglobin: 8.9 g/dL — ABNORMAL LOW (ref 13.0–17.0)

## 2019-04-11 LAB — MAGNESIUM: Magnesium: 2.1 mg/dL (ref 1.7–2.4)

## 2019-04-11 LAB — PREPARE RBC (CROSSMATCH)

## 2019-04-11 MED ORDER — SODIUM CHLORIDE 0.9% IV SOLUTION
Freq: Once | INTRAVENOUS | Status: AC
  Administered 2019-04-11: 11:00:00 via INTRAVENOUS

## 2019-04-11 MED ORDER — METOPROLOL SUCCINATE ER 100 MG PO TB24
100.0000 mg | ORAL_TABLET | Freq: Every day | ORAL | Status: DC
  Administered 2019-04-11 – 2019-04-13 (×3): 100 mg via ORAL
  Filled 2019-04-11 (×3): qty 1

## 2019-04-11 MED ORDER — WARFARIN SODIUM 5 MG PO TABS: 7.5000 mg | ORAL_TABLET | Freq: Once | ORAL | Status: DC

## 2019-04-11 NOTE — Progress Notes (Signed)
Progress Note  Patient Name: Leonard Cisneros Date of Encounter: 04/11/2019  Primary Cardiologist: [name], MD   Subjective   Getting blood soon. Thigh hematomas. No CP, no SOB. Hg still dropping.  HR elevated afib,   Inpatient Medications    Scheduled Meds:  atorvastatin  80 mg Oral Daily   Chlorhexidine Gluconate Cloth  6 each Topical Q0600   diltiazem  60 mg Oral Q8H   furosemide  40 mg Intravenous BID   warfarin  7.5 mg Oral ONCE-1800   Warfarin - Pharmacist Dosing Inpatient   Does not apply q1800   Continuous Infusions:  PRN Meds: acetaminophen, HYDROcodone-acetaminophen   Vital Signs    Vitals:   04/10/19 1652 04/10/19 1658 04/10/19 2052 04/11/19 0455  BP:  124/61 114/73 103/68  Pulse:  82 (!) 111 97  Resp:  18 18 16   Temp:  98.5 F (36.9 C) 97.8 F (36.6 C) 98.7 F (37.1 C)  TempSrc:  Oral Oral Oral  SpO2:  99% 97% 94%  Weight: 107 kg     Height: 5\' 11"  (1.803 m)       Intake/Output Summary (Last 24 hours) at 04/11/2019 0854 Last data filed at 04/11/2019 0800 Gross per 24 hour  Intake 480 ml  Output 2175 ml  Net -1695 ml   Last 3 Weights 04/10/2019 04/10/2019 04/09/2019  Weight (lbs) 235 lb 14.3 oz 236 lb 8.9 oz 236 lb 1.8 oz  Weight (kg) 107 kg 107.3 kg 107.1 kg      Telemetry    Atrial fibrillation heart rate 123- Personally Reviewed  ECG    A. fib- Personally Reviewed  Physical Exam   GEN: No acute distress.   Neck: No JVD Cardiac:  Irregularly irregular, no murmurs, rubs, or gallops.  Respiratory: Clear to auscultation bilaterally. GI: Soft, nontender, non-distended  MS: No edema; No deformity. Neuro:  Nonfocal  Psych: Normal affect   Labs    High Sensitivity Troponin:   Recent Labs  Lab 04/08/19 0543  TROPONINIHS 10      Cardiac EnzymesNo results for input(s): TROPONINI in the last 168 hours. No results for input(s): TROPIPOC in the last 168 hours.   Chemistry Recent Labs  Lab 04/09/19 0433 04/10/19 0758  04/11/19 0345  NA 138 137 135  K 4.1 3.6 3.6  CL 103 100 99  CO2 25 25 25   GLUCOSE 135* 118* 108*  BUN 16 18 16   CREATININE 0.84 0.86 0.82  CALCIUM 8.7* 8.4* 8.3*  ALBUMIN  --  3.2* 3.2*  GFRNONAA >60 >60 >60  GFRAA >60 >60 >60  ANIONGAP 10 12 11      Hematology Recent Labs  Lab 04/09/19 0433 04/10/19 0758 04/11/19 0345  WBC 12.5* 11.8* 11.2*  RBC 2.20* 2.09* 2.10*  HGB 7.9* 7.5* 7.3*  HCT 24.8* 22.9* 23.2*  MCV 112.7* 109.6* 110.5*  MCH 35.9* 35.9* 34.8*  MCHC 31.9 32.8 31.5  RDW 15.1 15.4 15.7*  PLT 407* 208 226    BNP Recent Labs  Lab 04/08/19 0543  BNP 113.3*     DDimer No results for input(s): DDIMER in the last 168 hours.   Radiology    Ct Abdomen Pelvis W Contrast  Result Date: 04/10/2019 CLINICAL DATA:  Recent right total hip arthroplasty 03/30/2019. Right pelvic and thigh pain and swelling/hematoma. EXAM: CT ABDOMEN AND PELVIS WITH CONTRAST TECHNIQUE: Multidetector CT imaging of the abdomen and pelvis was performed using the standard protocol following bolus administration of intravenous contrast. CONTRAST:  128mL  OMNIPAQUE IOHEXOL 300 MG/ML  SOLN COMPARISON:  Pelvic radiographs 03/30/2019.  Chest CTA 04/08/2019. FINDINGS: Lower chest: Stable cardiomegaly with atherosclerosis of the aorta and coronary arteries. There is no significant pleural or pericardial effusion. The lung bases are clear. Hepatobiliary: The liver is normal in density without suspicious focal abnormality. No evidence of gallstones, gallbladder wall thickening or biliary dilatation. Pancreas: Mild fatty replacement. No focal lesion, ductal dilatation or surrounding inflammation. Spleen: Normal in size without focal abnormality. Adrenals/Urinary Tract: Both adrenal glands appear normal. There is a 1 cm cyst in the upper pole of the left kidney. The kidneys otherwise appear normal. No evidence of urinary tract calculus or hydronephrosis. The bladder is partly obscured by artifact from the  bilateral hip prostheses, but demonstrates no abnormality. Stomach/Bowel: No evidence of bowel wall thickening, distention or surrounding inflammatory change. The appendix appears normal. Vascular/Lymphatic: There are no enlarged abdominal or pelvic lymph nodes. Aortic and branch vessel atherosclerosis. No acute vascular findings. Reproductive: The prostate gland is partly obscured by artifact from the hip prostheses, but does not appear enlarged. Other: No ascites or intra-abdominal inflammatory changes. Musculoskeletal: Status post median sternotomy, lower back surgery and bilateral total hip arthroplasty. No evidence of acute fracture or dislocation. There is multilevel lumbar spondylosis. There is fatty atrophy within the gluteus and proximal hamstring musculature bilaterally. There is a subcutaneous hematoma posterolateral to the right hip, measuring up to 6.5 cm on axial image 77/2 and 10.2 cm on coronal image 84/5. There is also high density within the right vastus lateralis muscle, measuring up to 12.6 x 4.8 cm on image 107/2, likely an intramuscular hematoma. No intrapelvic hematoma identified. IMPRESSION: 1. High density intramuscular and subcutaneous collections lateral to the right hip consistent with hematomas. No evidence of foreign body or acute osseous findings status post bilateral total hip arthroplasty. 2. No intrapelvic hematoma or inflammatory process. 3. Coronary and Aortic Atherosclerosis (ICD10-I70.0). Electronically Signed   By: Richardean Sale M.D.   On: 04/10/2019 17:12    Cardiac Studies   Nuclear stress test low risk 10/2018  Echocardiogram EF 55 to 60%  Patient Profile     78 y.o. male Recent hip surgery with atrial fibrillation rapid ventricular response  Assessment & Plan    Acute on chronic diastolic heart failure -Volume overload, lower extremity edema on presentation BNP 113. -Good response to IV Lasix. -Dopplers are negative for DVT.  Atrial fibrillation with  rapid ventricular response - Seems longstanding persistent by history. - Still a bit tachycardic. - Increase Toprol-XL to 100 mg.  Was taking 75 at home.  This medication is not currently on his medication list here in the hospital.  Continue with p.o. Cardizem 60 every 8 hours.  Anemia - Quite low 7.3.  Has been a steady decline since 2018.  Down from 14-7.9.  MCV appears macrocytic.  B12 and folate are normal. - CT demonstrated subcutaneous hematoma posterolateral to the right hip, measuring up to 6.5 cm on axial image 77/2 and 10.2 cm on coronal image 84/5. There is also high density within the right vastus lateralis muscle, measuring up to 12.6 x 4.8 cm on image 107/2, likely an intramuscular hematoma. No intrapelvic hematoma identified.  Would not be unreasonable to hold Coumadin at this time until hemoglobin demonstrates stability  Chronic anticoagulation - On Coumadin for A. Fib. Would not be unreasonable to hold Coumadin at this time until hemoglobin demonstrates stability. Discussed with Dr. Marylyn Ishihara.      For questions or updates,  please contact [place] Please consult [email or web address] for contact info under        Signed, Candee Furbish, MD  04/11/2019, 8:54 AM

## 2019-04-11 NOTE — Evaluation (Signed)
Physical Therapy Evaluation Patient Details Name: Leonard Cisneros MRN: [id number] DOB: 1941-03-14 Today's Date: 04/11/2019   History of Present Illness  Pt is a 78 year old male with recent history of Femoral revision right total hip arthroplasty on 03/30/19 and presented to ED with right thigh swelling.  Pt found to be in afib with RVR and have Macrocytic Anemia (tranfusing one unit PRBCs during PT evaluation).  PMHx: CAD, CABG, chronic A-fib  Clinical Impression  Pt admitted with above diagnosis. Pt currently with functional limitations due to the deficits listed below (see PT Problem List).  Pt will benefit from skilled PT to increase their independence and safety with mobility to allow discharge to the venue listed below.  Pt assisted with ambulating in hallway.  Pt presents with R LE edema especially thigh so provided ice packs end of session.  Pt reports doing well s/p right hip revision until swelling occurred.  Spouse present for evaluation.     Follow Up Recommendations No PT follow up    Equipment Recommendations  None recommended by PT    Recommendations for Other Services       Precautions / Restrictions Precautions Precautions: Posterior Hip;Fall Restrictions Other Position/Activity Restrictions: WBAT      Mobility  Bed Mobility               General bed mobility comments: pt up in recliner on arrival  Transfers Overall transfer level: Needs assistance Equipment used: Rolling walker (2 wheeled) Transfers: Sit to/from Stand Sit to Stand: Min guard         General transfer comment: utilizes UE self assist, decreased control of descent, reminder for hip precautions  Ambulation/Gait Ambulation/Gait assistance: Min guard Gait Distance (Feet): 180 Feet Assistive device: Rolling walker (2 wheeled) Gait Pattern/deviations: Decreased dorsiflexion - right;Decreased stance time - right;Antalgic;Step-through pattern     General Gait Details: verbal cues for  posture, observed increased hip/knee flexion on R LE to compensate for decreased active R DF  Pt denies any symptoms during ambulation  Stairs            Wheelchair Mobility    Modified Rankin (Stroke Patients Only)       Balance                                             Pertinent Vitals/Pain Pain Assessment: 0-10 Pain Score: 3  Pain Location: right thigh Pain Descriptors / Indicators: Tightness Pain Intervention(s): Monitored during session;Ice applied    Home Living Family/patient expects to be discharged to:: Private residence Living Arrangements: Spouse/significant other Available Help at Discharge: Family Type of Home: House Home Access: Stairs to enter Entrance Stairs-Rails: Can reach both Entrance Stairs-Number of Steps: 4 Home Layout: One level Home Equipment: Environmental consultant - 2 wheels;Crutches      Prior Function Level of Independence: Independent with assistive device(s)         Comments: pt was ambulating without assistive device on Tues and then thigh swelling and decreased mobility     Hand Dominance        Extremity/Trunk Assessment        Lower Extremity Assessment Lower Extremity Assessment: RLE deficits/detail RLE Deficits / Details: R LE edematous, presents with R foot drop (has script for AFO and will return for follow up), functional hip weakness however significant thigh swelling  Communication   Communication: No difficulties  Cognition Arousal/Alertness: Awake/alert Behavior During Therapy: WFL for tasks assessed/performed Overall Cognitive Status: Within Functional Limits for tasks assessed                                        General Comments      Exercises     Assessment/Plan    PT Assessment Patient needs continued PT services  PT Problem List Decreased strength;Decreased range of motion;Decreased knowledge of use of DME;Decreased mobility;Decreased knowledge of  precautions;Pain       PT Treatment Interventions DME instruction;Gait training;Therapeutic exercise;Functional mobility training;Therapeutic activities;Patient/family education    PT Goals (Current goals can be found in the Care Plan section)  Acute Rehab PT Goals PT Goal Formulation: With patient Time For Goal Achievement: 04/18/19 Potential to Achieve Goals: Good    Frequency Min 3X/week   Barriers to discharge        Co-evaluation               AM-PAC PT "6 Clicks" Mobility  Outcome Measure Help needed turning from your back to your side while in a flat bed without using bedrails?: A Little Help needed moving from lying on your back to sitting on the side of a flat bed without using bedrails?: A Little Help needed moving to and from a bed to a chair (including a wheelchair)?: A Little Help needed standing up from a chair using your arms (e.g., wheelchair or bedside chair)?: A Little Help needed to walk in hospital room?: A Little Help needed climbing 3-5 steps with a railing? : A Little 6 Click Score: 18    End of Session Equipment Utilized During Treatment: Gait belt Activity Tolerance: Patient tolerated treatment well Patient left: in chair;with call bell/phone within reach;with family/visitor present   PT Visit Diagnosis: Difficulty in walking, not elsewhere classified (R26.2)    Time: 0802-2336 PT Time Calculation (min) (ACUTE ONLY): 20 min   Charges:   PT Evaluation $PT Eval Low Complexity: Hat[place], PT, DPT Acute Rehabilitation Services Office: [phone] Pager: [phone]  Trena Platt 04/11/2019, 4:20 PM

## 2019-04-11 NOTE — Progress Notes (Addendum)
Leonard Cisneros  PROGRESS NOTE    Leonard Cisneros  KAJ:[id number] DOB: 26-Jul-1941 DOA: 04/08/2019 PCP: Leonard Kirschner, MD   Brief Narrative:   H/o CABG, Afib on coumadin, dCHF who was recently underwent right hip surgery by Dr Leonard Cisneros on 7/27. Returned to the ED due to above complaints. . He reports right thigh become swollen in the last 24-48hrs, he denies pain at rest, does has right sided pain with activity. She also reports feeling sob and fast heart rate.He denies fever, no chest pain. He denies orthopnea.  ED course: he is in afib/rvr, tmax 99.3, bp stable, no hypoxia, CTA negative for PE, right lower extremity US negative for DVT. Wbc 15.8, hgb 9, cr 0.77, bnp 113, high sensitivity troponin is 10, INR 1.9. He is started on cardizem drip, hospitalist called due to patient is in afib/rvr needing cardizem drip.  Patient reports to me, he has been having bilateral lower extremity edema for a few months, some days are worse than others. He was in the hospital in 10/2018 for heart failure and is started on lasix 40mg  daily. He reports does not use extra salt, he drink 6-8 glasses of water daily.   Assessment & Plan:   Active Problems:   CAD S/P percutaneous coronary angioplasty   Atrial fibrillation with RVR (HCC)   Acute on chronic diastolic CHF (congestive heart failure) (HCC)   Macrocytic anemia   Afib (HCC)   Afib/RVR - cardizem gtt transitioned to PO; monitor - continue coumadin (INR1.4)  - hold oral betablocker/ccb while on cardizem drip - cardiology consulted; appreciate assistance     - has been switched to PO cardizem     - can make an argument that his a fib has been tripped off by blood loss as noted on CT ab/pelvis     - let's hold on the coumadin right now per cards; appreciate assistance     - going to start metoprolol  Acute on chronic diastolic CHF - cxr "Cardiomegaly and mild vascular congestion.", mild volume overload on exam - IV lasix 40mg   BID; wean as able  - Fluids restriction  - cardiology consulted  Leukocytosis - No fever, from recent surgery? - UA negative - WBC improving; no abx at this time; monitor  Right leg/thigh edema - Negative for DVT - He denies pain at rest, no ecchymosis on exam, Monitor h/h - Ortho Dr Leonard Cisneros consulted; no intervention at this time - says his swelling is improved     - obtained CT ab/pelvis to r/o peritoneal/retroperitoneal bleeding; none seen; however, noted multiple hematomas in right thigh     - spoke with ortho; they will take a look, will likely just need to tamponade   Macrocytic Anemia  - hgb around 11 prior to surgery, hgb 10 on 7/28, hgb today is 9 - mcv chronically elevated but appear progressive from 102 to 109 - Hgb is down to 7.9; no evidence of frank bleed, continue to trend, consider CT ab/pelvis if he continues to drop - B12/THF wnl     - Hgb is down to 7.5.; will check CT ab/pelvis to look for bleed     - Hgb settling at 7.3; CT ab/pelvis w/o any peritoneal or retroperitoneal bleed; it does note right thigh hematomas; can make an argument that he is having symptomatic anemia; will transfuse 1 unit pRBCs  CAD s/p CABG - No chest pain, tropnonin negative - Continue home meds asa/statin/bb/imdur  HLD     - continue  atorvastatin  DVT prophylaxis: Coumadin Code Status: FULL   Disposition Plan: TBD  Consultants:   Cardiology  Orthopedics   ROS:  Denies CP, palpitations, N/V, numbness, tingling. Remainder 10-pt ROS is negative for all not previously mentioned.  Subjective: "I'll take the blood."  Objective: Vitals:   04/10/19 1652 04/10/19 1658 04/10/19 2052 04/11/19 0455  BP:  124/61 114/73 103/68  Pulse:  82 (!) 111 97  Resp:  18 18 16   Temp:  98.5 F (36.9 C) 97.8 F (36.6 C) 98.7 F (37.1 C)  TempSrc:  Oral Oral Oral  SpO2:  99% 97% 94%  Weight: 107 kg     Height: 5\' 11"  (1.803 m)        Intake/Output Summary (Last 24 hours) at 04/11/2019 0715 Last data filed at 04/11/2019 0200 Gross per 24 hour  Intake 480 ml  Output 2225 ml  Net -1745 ml   Filed Weights   04/09/19 0500 04/10/19 0245 04/10/19 1652  Weight: 107.1 kg 107.3 kg 107 kg    Examination:  General:78 y.o.maleresting in bed in NAD Eyes: PERRL, normal sclera ENMT: Nares patent w/o discharge, orophaynx clear, dentition normal, ears w/o discharge/lesions/ulcers Cardiovascular:irregular, +S1, S2,1/6 SEM,no g/r, equal pulses throughout Respiratory: CTABL, no w/r/r, normal WOB GI: BS+, NDNT, no masses noted, no organomegaly noted MSK:BLE edema, worse on right; although it is improving, no c/c Skin: No rashes, bruises, ulcerations noted Neuro: A&O x 3, no focal deficits Psyc: Appropriate interaction and affect, calm/cooperative   Data Reviewed: I have personally reviewed following labs and imaging studies.  CBC: Recent Labs  Lab 04/08/19 0543 04/09/19 0433 04/10/19 0758 04/11/19 0345  WBC 15.8* 12.5* 11.8* 11.2*  NEUTROABS 11.4*  --  8.7* 7.8*  HGB 9.0* 7.9* 7.5* 7.3*  HCT 28.1* 24.8* 22.9* 23.2*  MCV 109.3* 112.7* 109.6* 110.5*  PLT 251 407* 208 749   Basic Metabolic Panel: Recent Labs  Lab 04/08/19 0543 04/09/19 0433 04/10/19 0758 04/11/19 0345  NA 136 138 137 135  K 4.3 4.1 3.6 3.6  CL 102 103 100 99  CO2 22 25 25 25   GLUCOSE 138* 135* 118* 108*  BUN 18 16 18 16   CREATININE 0.77 0.84 0.86 0.82  CALCIUM 8.7* 8.7* 8.4* 8.3*  MG  --  2.0  --  2.1  PHOS  --   --  3.0 3.3   GFR: Estimated Creatinine Clearance: 93.9 mL/min (by C-G formula based on SCr of 0.82 mg/dL). Liver Function Tests: Recent Labs  Lab 04/10/19 0758 04/11/19 0345  ALBUMIN 3.2* 3.2*   No results for input(s): LIPASE, AMYLASE in the last 168 hours. No results for input(s): AMMONIA in the last 168 hours. Coagulation Profile: Recent Labs  Lab 04/08/19 0543 04/09/19 0433 04/10/19 0208 04/11/19 0345    INR 1.9* 1.4* 1.4* 1.5*   Cardiac Enzymes: No results for input(s): CKTOTAL, CKMB, CKMBINDEX, TROPONINI in the last 168 hours. BNP (last 3 results) No results for input(s): PROBNP in the last 8760 hours. HbA1C: No results for input(s): HGBA1C in the last 72 hours. CBG: No results for input(s): GLUCAP in the last 168 hours. Lipid Profile: No results for input(s): CHOL, HDL, LDLCALC, TRIG, CHOLHDL, LDLDIRECT in the last 72 hours. Thyroid Function Tests: Recent Labs    04/09/19 0433  TSH 1.321   Anemia Panel: Recent Labs    04/09/19 0433  VITAMINB12 338  FOLATE 8.9   Sepsis Labs: No results for input(s): PROCALCITON, LATICACIDVEN in the last 168 hours.  Recent Results (  from the past 240 hour(s))  SARS CORONAVIRUS 2 Nasal Swab Aptima Multi Swab     Status: None   Collection Time: 04/08/19  5:43 AM   Specimen: Aptima Multi Swab; Nasal Swab  Result Value Ref Range Status   SARS Coronavirus 2 NEGATIVE NEGATIVE Final    Comment: (NOTE) SARS-CoV-2 target nucleic acids are NOT DETECTED. The SARS-CoV-2 RNA is generally detectable in upper and lower respiratory specimens during the acute phase of infection. Negative results do not preclude SARS-CoV-2 infection, do not rule out co-infections with other pathogens, and should not be used as the sole basis for treatment or other patient management decisions. Negative results must be combined with clinical observations, patient history, and epidemiological information. The expected result is Negative. Fact Sheet for Patients: SugarRoll.be Fact Sheet for Healthcare Providers: https://www.woods-mathews.com/ This test is not yet approved or cleared by the [place] FDA and  has been authorized for detection and/or diagnosis of SARS-CoV-2 by FDA under an Emergency Use Authorization (EUA). This EUA will remain  in effect (meaning this test can be used) for the duration of the COVID-19  declaration under Section 56 4(b)(1) of the Act, 21 U.S.C. section 360bbb-3(b)(1), unless the authorization is terminated or revoked sooner. Performed at [facility], [place] [address] [place], [place] [address]   MRSA PCR Screening     Status: None   Collection Time: 04/08/19  7:04 PM   Specimen: Nasopharyngeal  Result Value Ref Range Status   MRSA by PCR NEGATIVE NEGATIVE Final    Comment:        The GeneXpert MRSA Assay (FDA approved for NASAL specimens only), is one component of a comprehensive MRSA colonization surveillance program. It is not intended to diagnose MRSA infection nor to guide or monitor treatment for MRSA infections. Performed at [facility], [place] [address] [place], [place] [address]   Culture, blood (routine x 2)     Status: None (Preliminary result)   Collection Time: 04/09/19  3:33 PM   Specimen: BLOOD  Result Value Ref Range Status   Specimen Description   Final    BLOOD LEFT ARM Performed at [facility] [address], [place], [place] [address]    Special Requests   Final    BOTTLES DRAWN AEROBIC AND ANAEROBIC Blood Culture adequate volume Performed at [facility] [address], [place] [employer], [place] [address]    Culture   Final    NO GROWTH < 12 HOURS Performed at [facility] [address], [place], [place] [address]    Report Status PENDING  Incomplete  Culture, blood (routine x 2)     Status: None (Preliminary result)   Collection Time: 04/09/19  3:33 PM   Specimen: BLOOD  Result Value Ref Range Status   Specimen Description   Final    BLOOD RIGHT HAND Performed at [facility] [address] [place], [place] [address]    Special Requests   Final    BOTTLES DRAWN AEROBIC ONLY Blood Culture results may not be optimal due to an inadequate volume of blood received in culture bottles Performed at [facility] [address] [place], [place] [address]    Culture   Final    NO GROWTH < 12 HOURS Performed at [facility] [address] [place], [place] [address]    Report Status PENDING  Incomplete      Radiology Studies: Ct  Abdomen Pelvis W Contrast  Result Date: 04/10/2019 CLINICAL DATA:  Recent right total hip arthroplasty 03/30/2019. Right pelvic and thigh pain and swelling/hematoma. EXAM: CT ABDOMEN AND PELVIS WITH CONTRAST TECHNIQUE: Multidetector CT imaging of the abdomen and pelvis was performed using the standard protocol following bolus administration of intravenous contrast. CONTRAST:  174mL OMNIPAQUE IOHEXOL 300 MG/ML  SOLN COMPARISON:  Pelvic radiographs 03/30/2019.  Chest CTA 04/08/2019. FINDINGS: Lower chest: Stable cardiomegaly with atherosclerosis of the aorta and coronary arteries. There is no significant pleural or pericardial effusion. The lung bases are clear. Hepatobiliary: The liver is normal in density without suspicious focal abnormality. No evidence of gallstones, gallbladder wall thickening or biliary dilatation. Pancreas: Mild fatty replacement. No focal lesion, ductal dilatation or surrounding inflammation. Spleen: Normal in size without focal abnormality. Adrenals/Urinary Tract: Both adrenal glands appear normal. There is a 1 cm cyst in the upper pole of the left kidney. The kidneys otherwise appear normal. No evidence of urinary tract calculus or hydronephrosis. The bladder is partly obscured by artifact from the bilateral hip prostheses, but demonstrates no abnormality. Stomach/Bowel: No evidence of bowel wall thickening, distention or surrounding inflammatory change. The appendix appears normal. Vascular/Lymphatic: There are no enlarged abdominal or pelvic lymph nodes. Aortic and branch vessel atherosclerosis. No acute vascular findings. Reproductive: The prostate gland is partly obscured by artifact from the hip prostheses, but does not appear enlarged. Other: No ascites or  intra-abdominal inflammatory changes. Musculoskeletal: Status post median sternotomy, lower back surgery and bilateral total hip arthroplasty. No evidence of acute fracture or dislocation. There is multilevel lumbar spondylosis. There is fatty atrophy within the gluteus and proximal hamstring musculature bilaterally. There is a subcutaneous hematoma posterolateral to the right hip, measuring up to 6.5 cm on axial image 77/2 and 10.2 cm on coronal image 84/5. There is also high density within the right vastus lateralis muscle, measuring up to 12.6 x 4.8 cm on image 107/2, likely an intramuscular hematoma. No intrapelvic hematoma identified. IMPRESSION: 1. High density intramuscular and subcutaneous collections lateral to the right hip consistent with hematomas. No evidence of foreign body or acute osseous findings status post bilateral total hip arthroplasty. 2. No intrapelvic hematoma or inflammatory process. 3. Coronary and Aortic Atherosclerosis (ICD10-I70.0). Electronically Signed   By: Richardean Sale M.D.   On: 04/10/2019 17:12     Scheduled Meds:  atorvastatin  80 mg Oral Daily   Chlorhexidine Gluconate Cloth  6 each Topical Q0600   diltiazem  60 mg Oral Q8H   furosemide  40 mg Intravenous BID   Warfarin - Pharmacist Dosing Inpatient   Does not apply q1800   Continuous Infusions:   LOS: 2 days    Time spent: 25 minutes spent in the coordination of care today.    Jonnie Finner, DO [facility] Pager [phone]  If 7PM-7AM, please contact night-coverage [email or web address] Password [email or web address] 04/11/2019, 7:15 AM

## 2019-04-11 NOTE — Progress Notes (Signed)
Leonard Cisneros for warfarin Indication: hx atrial fibrillation  Allergies  Allergen Reactions  . Augmentin [Amoxicillin-Pot Clavulanate] Nausea And Vomiting and Other (See Comments)    Has patient had a PCN reaction causing immediate rash, facial/tongue/throat swelling, SOB or lightheadedness with hypotension: Unknown Has patient had a PCN reaction causing severe rash involving mucus membranes or skin necrosis: No Has patient had a PCN reaction that required hospitalization: No Has patient had a PCN reaction occurring within the last 10 years: No If all of the above answers are "NO", then may proceed with Cephalosporin use.    Patient Measurements: Height: 5\' 11"  (180.3 cm) Weight: 235 lb 14.3 oz (107 kg) IBW/kg (Calculated) : 75.3 Heparin Dosing Weight:   Vital Signs: Temp: 98.7 F (37.1 C) (08/08 0455) Temp Source: Oral (08/08 0455) BP: 103/68 (08/08 0455) Pulse Rate: 97 (08/08 0455)  Labs: Recent Labs    04/09/19 0433 04/10/19 0208 04/10/19 0758 04/11/19 0345  HGB 7.9*  --  7.5* 7.3*  HCT 24.8*  --  22.9* 23.2*  PLT 407*  --  208 226  LABPROT 16.6* 16.7*  --  17.5*  INR 1.4* 1.4*  --  1.5*  CREATININE 0.84  --  0.86 0.82    Estimated Creatinine Clearance: 93.9 mL/min (by C-G formula based on SCr of 0.82 mg/dL).   Medications:  - PTA warfarin regimen: home dose: 7.5 mg daily except 3.75 mg on Mon and Fri per [facility] clinic note on 8/3  Assessment: [name] a 78 y.o M with hx recent right THA revision on 7/27 (discharge on 7/28), heart failure, and chronic afib on warfarin PTA, presented to the ED on 8/5 with c/o SOB and LE swelling.  Chest CTA was negative for PE and LE doppler negative for DVT.  Warfarin resumed on admission.  Today, 04/11/2019: - INR is remains subtherapeutic at 1.5 - hgb down further to  7.3, plts 226 - no bleeding documented - no significant drug-drug intxns - on cardiac diet, po intake improved  Goal of  Therapy:  INR 2-3 Monitor platelets by anticoagulation protocol: Yes   Plan:  - Warfarin 7.5 mg PO x1 at 1800 - daily INR - monitor for s/s bleeding  Leonard Cisneros, Jaylee Freeze L 04/11/2019,8:22 AM

## 2019-04-12 DIAGNOSIS — Z9861 Coronary angioplasty status: Secondary | ICD-10-CM

## 2019-04-12 DIAGNOSIS — D539 Nutritional anemia, unspecified: Secondary | ICD-10-CM

## 2019-04-12 DIAGNOSIS — I2510 Atherosclerotic heart disease of native coronary artery without angina pectoris: Secondary | ICD-10-CM

## 2019-04-12 DIAGNOSIS — R600 Localized edema: Secondary | ICD-10-CM

## 2019-04-12 LAB — CBC
HCT: 25.7 % — ABNORMAL LOW (ref 39.0–52.0)
Hemoglobin: 8.3 g/dL — ABNORMAL LOW (ref 13.0–17.0)
MCH: 33.6 pg (ref 26.0–34.0)
MCHC: 32.3 g/dL (ref 30.0–36.0)
MCV: 104 fL — ABNORMAL HIGH (ref 80.0–100.0)
Platelets: 240 10*3/uL (ref 150–400)
RBC: 2.47 MIL/uL — ABNORMAL LOW (ref 4.22–5.81)
RDW: 19.5 % — ABNORMAL HIGH (ref 11.5–15.5)
WBC: 12.5 10*3/uL — ABNORMAL HIGH (ref 4.0–10.5)
nRBC: 0 % (ref 0.0–0.2)

## 2019-04-12 LAB — BPAM RBC
Blood Product Expiration Date: 202009022359
ISSUE DATE / TIME: 202008081223
Unit Type and Rh: 5100

## 2019-04-12 LAB — TYPE AND SCREEN
ABO/RH(D): O POS
Antibody Screen: NEGATIVE
Unit division: 0

## 2019-04-12 LAB — RENAL FUNCTION PANEL
Albumin: 3.2 g/dL — ABNORMAL LOW (ref 3.5–5.0)
Anion gap: 9 (ref 5–15)
BUN: 14 mg/dL (ref 8–23)
CO2: 28 mmol/L (ref 22–32)
Calcium: 8.3 mg/dL — ABNORMAL LOW (ref 8.9–10.3)
Chloride: 98 mmol/L (ref 98–111)
Creatinine, Ser: 0.76 mg/dL (ref 0.61–1.24)
GFR calc Af Amer: >60 mL/min (ref >60)
GFR calc non Af Amer: >60 mL/min (ref >60)
Glucose, Bld: 120 mg/dL — ABNORMAL HIGH (ref 70–99)
Phosphorus: 2.7 mg/dL (ref 2.5–4.6)
Potassium: 3 mmol/L — ABNORMAL LOW (ref 3.5–5.1)
Sodium: 135 mmol/L (ref 135–145)

## 2019-04-12 LAB — PROTIME-INR
INR: 1.5 — ABNORMAL HIGH (ref 0.8–1.2)
Prothrombin Time: 17.6 seconds — ABNORMAL HIGH (ref 11.4–15.2)

## 2019-04-12 LAB — MAGNESIUM: Magnesium: 2 mg/dL (ref 1.7–2.4)

## 2019-04-12 MED ORDER — POTASSIUM CHLORIDE CRYS ER 20 MEQ PO TBCR
40.0000 meq | EXTENDED_RELEASE_TABLET | Freq: Two times a day (BID) | ORAL | Status: DC
  Administered 2019-04-12 – 2019-04-13 (×3): 40 meq via ORAL
  Filled 2019-04-12 (×3): qty 2

## 2019-04-12 NOTE — Progress Notes (Addendum)
     Subjective: S/P right THA revision  Patient reports pain as mild/moderate.  No reported events throughout the night.  Patient feels that he is doing significantly better than prior to beginning of the thigh swelling and wondering if hee might be able to go home soon.    Objective:   VITALS:   Vitals:   04/12/19 0504 04/12/19 0940  BP: (!) 116/59   Pulse: 89 (!) 103  Resp: 14   Temp: 98.7 F (37.1 C)   SpO2: 97%     Neurovascular intact Incision: no drainage No cellulitis present  Right thigh swelling, but compartments are soft ( decreased per the patient)   LABS Recent Labs    04/10/19 0758 04/11/19 0345 04/11/19 1703 04/12/19 0516  HGB 7.5* 7.3* 8.9* 8.3*  HCT 22.9* 23.2* 27.3* 25.7*  WBC 11.8* 11.2*  --  12.5*  PLT 208 226  --  240    Recent Labs    04/10/19 0758 04/11/19 0345 04/12/19 0516  NA 137 135 135  K 3.6 3.6 3.0*  BUN 18 16 14   CREATININE 0.86 0.82 0.76  GLUCOSE 118* 108* 120*     Assessment/Plan: S/P right THA revision   He has some thigh swelling, but this has improved significantly per the patient.  No signs of infection. Previous workup for DVT/PE was negative. Swelling is again is most like secondary to increased activity post-op, significant fluid retention probably cardiac related and anticoagulation with Coumandin.  No compartment syndrome and NV intact continue with current plan  plan allow the area to heal as the thigh is improving.  I do not suspect an active bleeding as Dr. Wynelle Link stated before. Coumadin stopped and is appropriate until H&H stable, continue mechanical prophylaxisis.  Probably some relief from diuresis  He is scheduled to Dr. Wynelle Link in the office on 8/11. Please call with any other questions or concerns while in the hospital   [place]. [place] Difranco   PAC  04/12/2019, 10:12 AM

## 2019-04-12 NOTE — Progress Notes (Signed)
Leonard Cisneros  PROGRESS NOTE    [name]  JAS:[id number] DOB: 05/12/1941 DOA: 04/08/2019 PCP: [name], MD   Brief Narrative:   H/o CABG, Afib on coumadin, dCHF who was recently underwent right hip surgery by Dr [name] on 7/27. Returned to the ED due to above complaints. . He reports right thigh become swollen in the last 24-48hrs, he denies pain at rest, does has right sided pain with activity. She also reports feeling sob and fast heart rate.He denies fever, no chest pain. He denies orthopnea.  ED course: he is in afib/rvr, tmax 99.3, bp stable, no hypoxia, CTA negative for PE, right lower extremity US negative for DVT. Wbc 15.8, hgb 9, cr 0.77, bnp 113, high sensitivity troponin is 10, INR 1.9. He is started on cardizem drip, hospitalist called due to patient is in afib/rvr needing cardizem drip.  Patient reports to me, he has been having bilateral lower extremity edema for a few months, some days are worse than others. He was in the hospital in 10/2018 for heart failure and is started on lasix 40mg  daily. He reports does not use extra salt, he drink 6-8 glasses of water daily.   Assessment & Plan:   Active Problems:   CAD S/P percutaneous coronary angioplasty   Atrial fibrillation with RVR (HCC)   Acute on chronic diastolic CHF (congestive heart failure) (HCC)   Macrocytic anemia   Afib (HCC)   Afib/RVR - cardizem gtt transitioned to PO; monitor - continue coumadin (INR1.4)  - hold oral betablocker/ccb while on cardizem drip - cardiology consulted; appreciate assistance     - has been switched to PO cardizem     - can make an argument that his a fib has been tripped off by blood loss as noted on CT ab/pelvis     - let's hold on the coumadin right now per cards; appreciate assistance     - going to start metoprolol     - hold coumadin for next couple days; if he holds his Hgb, will resume coumadin slowly w/o heparin brdige  Acute on chronic diastolic  CHF - cxr "Cardiomegaly and mild vascular congestion.", mild volume overload on exam - IV lasix 40mg  BID; wean as able  - Fluids restriction  - cardiology consulted; appreciate assistance  Leukocytosis - No fever, from recent surgery? - UA negative - no abx at this time; monitor  Right leg/thigh edema - Negative for DVT - He denies pain at rest, no ecchymosis on exam, Monitor h/h - Ortho Dr [name] consulted; no intervention at this time - says his swelling is improved     - obtained CT ab/pelvis to r/o peritoneal/retroperitoneal bleeding; none seen; however, noted multiple hematomas in right thigh     - spoke with ortho; they will take a look, will likely just need to tamponade   Macrocytic Anemia  - hgb around 11 prior to surgery, hgb 10 on 7/28, hgb today is 9 - mcv chronically elevated but appear progressive from 102 to 109 - Hgb is down to 7.9; no evidence of frank bleed, continue to trend, consider CT ab/pelvis if he continues to drop - B12/THF wnl - Hgb is down to 7.5.; will check CT ab/pelvis to look for bleed     - Hgb settling at 7.3; CT ab/pelvis w/o any peritoneal or retroperitoneal bleed; it does note right thigh hematomas; can make an argument that he is having symptomatic anemia; will transfuse 1 unit pRBCs     -  appropriate response to pRBCs; monitor  CAD s/p CABG - No chest pain, tropnonin negative - Continue home meds asa/statin/bb/imdur  HLD     - continue atorvastatin  DVT prophylaxis: Held d/t bleed Code Status: FULL   Disposition Plan: TBD  Consultants:   Cardiology  Orthopedics   ROS:  Denies CP, SOB, N, V. Reports improved thigh pain. Remainder 10-pt ROS is negative for all not previously mentioned.  Subjective: "I think I can move it a bit more."  Objective: Vitals:   04/11/19 1252 04/11/19 1512 04/11/19 2228 04/12/19 0504  BP: 121/74 113/79 111/61 (!) 116/59   Pulse: 93 88 83 89  Resp: 17 16 14 14   Temp: 99.5 F (37.5 C) 98.8 F (37.1 C) 98.5 F (36.9 C) 98.7 F (37.1 C)  TempSrc: Oral Oral Oral Oral  SpO2: 99% 97% 95% 97%  Weight:    105.9 kg  Height:        Intake/Output Summary (Last 24 hours) at 04/12/2019 0910 Last data filed at 04/12/2019 0724 Gross per 24 hour  Intake 540 ml  Output 1995 ml  Net -1455 ml   Filed Weights   04/10/19 0245 04/10/19 1652 04/12/19 0504  Weight: 107.3 kg 107 kg 105.9 kg    Examination:  General:78 y.o.maleresting in chair in NAD Eyes: PERRL, normal sclera ENMT: Nares patent w/o discharge, orophaynx clear, dentition normal, ears w/o discharge/lesions/ulcers Cardiovascular:irregular, +S1, S2,1/6 SEM,no g/r, equal pulses throughout Respiratory: CTABL, no w/r/r, normal WOB GI: BS+, NDNT, no masses noted, no organomegaly noted MSK:BLE edema, worse on right; no c/c Skin: No rashes, ulcerations noted Neuro: A&O x 3, no focal deficits Psyc: Appropriate interaction and affect, calm/cooperative   Data Reviewed: I have personally reviewed following labs and imaging studies.  CBC: Recent Labs  Lab 04/08/19 0543 04/09/19 0433 04/10/19 0758 04/11/19 0345 04/11/19 1703 04/12/19 0516  WBC 15.8* 12.5* 11.8* 11.2*  --  12.5*  NEUTROABS 11.4*  --  8.7* 7.8*  --   --   HGB 9.0* 7.9* 7.5* 7.3* 8.9* 8.3*  HCT 28.1* 24.8* 22.9* 23.2* 27.3* 25.7*  MCV 109.3* 112.7* 109.6* 110.5*  --  104.0*  PLT 251 407* 208 226  --  321   Basic Metabolic Panel: Recent Labs  Lab 04/08/19 0543 04/09/19 0433 04/10/19 0758 04/11/19 0345 04/12/19 0516  NA 136 138 137 135 135  K 4.3 4.1 3.6 3.6 3.0*  CL 102 103 100 99 98  CO2 22 25 25 25 28   GLUCOSE 138* 135* 118* 108* 120*  BUN 18 16 18 16 14   CREATININE 0.77 0.84 0.86 0.82 0.76  CALCIUM 8.7* 8.7* 8.4* 8.3* 8.3*  MG  --  2.0  --  2.1 2.0  PHOS  --   --  3.0 3.3 2.7   GFR: Estimated Creatinine Clearance: 95.7 mL/min (by C-G formula based on SCr of 0.76  mg/dL). Liver Function Tests: Recent Labs  Lab 04/10/19 0758 04/11/19 0345 04/12/19 0516  ALBUMIN 3.2* 3.2* 3.2*   No results for input(s): LIPASE, AMYLASE in the last 168 hours. No results for input(s): AMMONIA in the last 168 hours. Coagulation Profile: Recent Labs  Lab 04/08/19 0543 04/09/19 0433 04/10/19 0208 04/11/19 0345 04/12/19 0516  INR 1.9* 1.4* 1.4* 1.5* 1.5*   Cardiac Enzymes: No results for input(s): CKTOTAL, CKMB, CKMBINDEX, TROPONINI in the last 168 hours. BNP (last 3 results) No results for input(s): PROBNP in the last 8760 hours. HbA1C: No results for input(s): HGBA1C in the last 72 hours.  CBG: No results for input(s): GLUCAP in the last 168 hours. Lipid Profile: No results for input(s): CHOL, HDL, LDLCALC, TRIG, CHOLHDL, LDLDIRECT in the last 72 hours. Thyroid Function Tests: No results for input(s): TSH, T4TOTAL, FREET4, T3FREE, THYROIDAB in the last 72 hours. Anemia Panel: No results for input(s): VITAMINB12, FOLATE, FERRITIN, TIBC, IRON, RETICCTPCT in the last 72 hours. Sepsis Labs: No results for input(s): PROCALCITON, LATICACIDVEN in the last 168 hours.  Recent Results (from the past 240 hour(s))  SARS CORONAVIRUS 2 Nasal Swab Aptima Multi Swab     Status: None   Collection Time: 04/08/19  5:43 AM   Specimen: Aptima Multi Swab; Nasal Swab  Result Value Ref Range Status   SARS Coronavirus 2 NEGATIVE NEGATIVE Final    Comment: (NOTE) SARS-CoV-2 target nucleic acids are NOT DETECTED. The SARS-CoV-2 RNA is generally detectable in upper and lower respiratory specimens during the acute phase of infection. Negative results do not preclude SARS-CoV-2 infection, do not rule out co-infections with other pathogens, and should not be used as the sole basis for treatment or other patient management decisions. Negative results must be combined with clinical observations, patient history, and epidemiological information. The expected result is Negative.  Fact Sheet for Patients: SugarRoll.be Fact Sheet for Healthcare Providers: https://www.woods-mathews.com/ This test is not yet approved or cleared by the [place] FDA and  has been authorized for detection and/or diagnosis of SARS-CoV-2 by FDA under an Emergency Use Authorization (EUA). This EUA will remain  in effect (meaning this test can be used) for the duration of the COVID-19 declaration under Section 56 4(b)(1) of the Act, 21 U.S.C. section 360bbb-3(b)(1), unless the authorization is terminated or revoked sooner. Performed at [facility], [place] [address], [place], [place] [address]   MRSA PCR Screening     Status: None   Collection Time: 04/08/19  7:04 PM   Specimen: Nasopharyngeal  Result Value Ref Range Status   MRSA by PCR NEGATIVE NEGATIVE Final    Comment:        The GeneXpert MRSA Assay (FDA approved for NASAL specimens only), is one component of a comprehensive MRSA colonization surveillance program. It is not intended to diagnose MRSA infection nor to guide or monitor treatment for MRSA infections. Performed at [facility], [place] [address] [place], [place] [address]   Culture, blood (routine x 2)     Status: None (Preliminary result)   Collection Time: 04/09/19  3:33 PM   Specimen: BLOOD  Result Value Ref Range Status   Specimen Description   Final    BLOOD LEFT ARM Performed at [facility] [address] [place], [place] [address]    Special Requests   Final    BOTTLES DRAWN AEROBIC AND ANAEROBIC Blood Culture adequate volume Performed at [facility] [address], [place], [place] [address]    Culture   Final    NO GROWTH 2 DAYS Performed at [facility] [address] [place], [place] [address]    Report Status PENDING  Incomplete  Culture, blood (routine x 2)     Status: None (Preliminary result)   Collection Time: 04/09/19   3:33 PM   Specimen: BLOOD  Result Value Ref Range Status   Specimen Description   Final    BLOOD RIGHT HAND Performed at [facility] [address] [place],  [address]    Special Requests   Final    BOTTLES DRAWN  AEROBIC ONLY Blood Culture results may not be optimal due to an inadequate volume of blood received in culture bottles Performed at [facility], [place] [address] [place], [place] [address]    Culture   Final    NO GROWTH 2 DAYS Performed at [facility] [address], [place], [place] [address]    Report Status PENDING  Incomplete      Radiology Studies: Ct Abdomen Pelvis W Contrast  Result Date: 04/10/2019 CLINICAL DATA:  Recent right total hip arthroplasty 03/30/2019. Right pelvic and thigh pain and swelling/hematoma. EXAM: CT ABDOMEN AND PELVIS WITH CONTRAST TECHNIQUE: Multidetector CT imaging of the abdomen and pelvis was performed using the standard protocol following bolus administration of intravenous contrast. CONTRAST:  120mL OMNIPAQUE IOHEXOL 300 MG/ML  SOLN COMPARISON:  Pelvic radiographs 03/30/2019.  Chest CTA 04/08/2019. FINDINGS: Lower chest: Stable cardiomegaly with atherosclerosis of the aorta and coronary arteries. There is no significant pleural or pericardial effusion. The lung bases are clear. Hepatobiliary: The liver is normal in density without suspicious focal abnormality. No evidence of gallstones, gallbladder wall thickening or biliary dilatation. Pancreas: Mild fatty replacement. No focal lesion, ductal dilatation or surrounding inflammation. Spleen: Normal in size without focal abnormality. Adrenals/Urinary Tract: Both adrenal glands appear normal. There is a 1 cm cyst in the upper pole of the left kidney. The kidneys otherwise appear normal. No evidence of urinary tract calculus or hydronephrosis. The bladder is partly obscured by artifact from the bilateral hip prostheses, but demonstrates no  abnormality. Stomach/Bowel: No evidence of bowel wall thickening, distention or surrounding inflammatory change. The appendix appears normal. Vascular/Lymphatic: There are no enlarged abdominal or pelvic lymph nodes. Aortic and branch vessel atherosclerosis. No acute vascular findings. Reproductive: The prostate gland is partly obscured by artifact from the hip prostheses, but does not appear enlarged. Other: No ascites or intra-abdominal inflammatory changes. Musculoskeletal: Status post median sternotomy, lower back surgery and bilateral total hip arthroplasty. No evidence of acute fracture or dislocation. There is multilevel lumbar spondylosis. There is fatty atrophy within the gluteus and proximal hamstring musculature bilaterally. There is a subcutaneous hematoma posterolateral to the right hip, measuring up to 6.5 cm on axial image 77/2 and 10.2 cm on coronal image 84/5. There is also high density within the right vastus lateralis muscle, measuring up to 12.6 x 4.8 cm on image 107/2, likely an intramuscular hematoma. No intrapelvic hematoma identified. IMPRESSION: 1. High density intramuscular and subcutaneous collections lateral to the right hip consistent with hematomas. No evidence of foreign body or acute osseous findings status post bilateral total hip arthroplasty. 2. No intrapelvic hematoma or inflammatory process. 3. Coronary and Aortic Atherosclerosis (ICD10-I70.0). Electronically Signed   By: Richardean Sale M.D.   On: 04/10/2019 17:12     Scheduled Meds: . atorvastatin  80 mg Oral Daily  . Chlorhexidine Gluconate Cloth  6 each Topical Q0600  . diltiazem  60 mg Oral Q8H  . furosemide  40 mg Intravenous BID  . metoprolol succinate  100 mg Oral Daily  . potassium chloride  40 mEq Oral BID  . Warfarin - Pharmacist Dosing Inpatient   Does not apply q1800   Continuous Infusions:   LOS: 3 days    Time spent: 25 minutes spent in the coordination of care today.   Jonnie Finner, DO [facility] Pager [phone]  If 7PM-7AM, please contact night-coverage [email or web address] Password [email or web address] 04/12/2019, 9:10 AM

## 2019-04-12 NOTE — Progress Notes (Signed)
Progress Note  Patient Name: Leonard Cisneros Date of Encounter: 04/12/2019  Primary Cardiologist: [name], MD   Subjective   /8/20 received 1 unit of packed red blood cells.  Thigh hematoma noted on CT scan.  Hemoglobin is low at 7.3.  Relatively asymptomatic.  Denies any chest pain shortness of breath.  Heart rate feels better.  No chest pain no shortness of breath.  Inpatient Medications    Scheduled Meds:  atorvastatin  80 mg Oral Daily   Chlorhexidine Gluconate Cloth  6 each Topical Q0600   diltiazem  60 mg Oral Q8H   furosemide  40 mg Intravenous BID   metoprolol succinate  100 mg Oral Daily   potassium chloride  40 mEq Oral BID   Warfarin - Pharmacist Dosing Inpatient   Does not apply q1800   Continuous Infusions:  PRN Meds: acetaminophen, HYDROcodone-acetaminophen   Vital Signs    Vitals:   04/11/19 1252 04/11/19 1512 04/11/19 2228 04/12/19 0504  BP: 121/74 113/79 111/61 (!) 116/59  Pulse: 93 88 83 89  Resp: 17 16 14 14   Temp: 99.5 F (37.5 C) 98.8 F (37.1 C) 98.5 F (36.9 C) 98.7 F (37.1 C)  TempSrc: Oral Oral Oral Oral  SpO2: 99% 97% 95% 97%  Weight:    105.9 kg  Height:        Intake/Output Summary (Last 24 hours) at 04/12/2019 0851 Last data filed at 04/12/2019 0724 Gross per 24 hour  Intake 540 ml  Output 1995 ml  Net -1455 ml   Last 3 Weights 04/12/2019 04/10/2019 04/10/2019  Weight (lbs) 233 lb 7.5 oz 235 lb 14.3 oz 236 lb 8.9 oz  Weight (kg) 105.9 kg 107 kg 107.3 kg      Telemetry    Atrial fibrillation heart rate 86 currently, improved rate control- Personally Reviewed  ECG    Atrial fibrillation- Personally Reviewed  Physical Exam   GEN: No acute distress.   Neck: No JVD Cardiac:  Irregularly irregular, no murmurs, rubs, or gallops.  Respiratory: Clear to auscultation bilaterally. GI: Soft, nontender, non-distended  MS: No edema; No deformity. Neuro:  Nonfocal  Psych: Normal affect   Labs    High Sensitivity  Troponin:   Recent Labs  Lab 04/08/19 0543  TROPONINIHS 10      Cardiac EnzymesNo results for input(s): TROPONINI in the last 168 hours. No results for input(s): TROPIPOC in the last 168 hours.   Chemistry Recent Labs  Lab 04/10/19 0758 04/11/19 0345 04/12/19 0516  NA 137 135 135  K 3.6 3.6 3.0*  CL 100 99 98  CO2 25 25 28   GLUCOSE 118* 108* 120*  BUN 18 16 14   CREATININE 0.86 0.82 0.76  CALCIUM 8.4* 8.3* 8.3*  ALBUMIN 3.2* 3.2* 3.2*  GFRNONAA >60 >60 >60  GFRAA >60 >60 >60  ANIONGAP 12 11 9      Hematology Recent Labs  Lab 04/10/19 0758 04/11/19 0345 04/11/19 1703 04/12/19 0516  WBC 11.8* 11.2*  --  12.5*  RBC 2.09* 2.10*  --  2.47*  HGB 7.5* 7.3* 8.9* 8.3*  HCT 22.9* 23.2* 27.3* 25.7*  MCV 109.6* 110.5*  --  104.0*  MCH 35.9* 34.8*  --  33.6  MCHC 32.8 31.5  --  32.3  RDW 15.4 15.7*  --  19.5*  PLT 208 226  --  240    BNP Recent Labs  Lab 04/08/19 0543  BNP 113.3*     DDimer No results for input(s): DDIMER in  the last 168 hours.   Radiology    Ct Abdomen Pelvis W Contrast  Result Date: 04/10/2019 CLINICAL DATA:  Recent right total hip arthroplasty 03/30/2019. Right pelvic and thigh pain and swelling/hematoma. EXAM: CT ABDOMEN AND PELVIS WITH CONTRAST TECHNIQUE: Multidetector CT imaging of the abdomen and pelvis was performed using the standard protocol following bolus administration of intravenous contrast. CONTRAST:  160mL OMNIPAQUE IOHEXOL 300 MG/ML  SOLN COMPARISON:  Pelvic radiographs 03/30/2019.  Chest CTA 04/08/2019. FINDINGS: Lower chest: Stable cardiomegaly with atherosclerosis of the aorta and coronary arteries. There is no significant pleural or pericardial effusion. The lung bases are clear. Hepatobiliary: The liver is normal in density without suspicious focal abnormality. No evidence of gallstones, gallbladder wall thickening or biliary dilatation. Pancreas: Mild fatty replacement. No focal lesion, ductal dilatation or surrounding inflammation.  Spleen: Normal in size without focal abnormality. Adrenals/Urinary Tract: Both adrenal glands appear normal. There is a 1 cm cyst in the upper pole of the left kidney. The kidneys otherwise appear normal. No evidence of urinary tract calculus or hydronephrosis. The bladder is partly obscured by artifact from the bilateral hip prostheses, but demonstrates no abnormality. Stomach/Bowel: No evidence of bowel wall thickening, distention or surrounding inflammatory change. The appendix appears normal. Vascular/Lymphatic: There are no enlarged abdominal or pelvic lymph nodes. Aortic and branch vessel atherosclerosis. No acute vascular findings. Reproductive: The prostate gland is partly obscured by artifact from the hip prostheses, but does not appear enlarged. Other: No ascites or intra-abdominal inflammatory changes. Musculoskeletal: Status post median sternotomy, lower back surgery and bilateral total hip arthroplasty. No evidence of acute fracture or dislocation. There is multilevel lumbar spondylosis. There is fatty atrophy within the gluteus and proximal hamstring musculature bilaterally. There is a subcutaneous hematoma posterolateral to the right hip, measuring up to 6.5 cm on axial image 77/2 and 10.2 cm on coronal image 84/5. There is also high density within the right vastus lateralis muscle, measuring up to 12.6 x 4.8 cm on image 107/2, likely an intramuscular hematoma. No intrapelvic hematoma identified. IMPRESSION: 1. High density intramuscular and subcutaneous collections lateral to the right hip consistent with hematomas. No evidence of foreign body or acute osseous findings status post bilateral total hip arthroplasty. 2. No intrapelvic hematoma or inflammatory process. 3. Coronary and Aortic Atherosclerosis (ICD10-I70.0). Electronically Signed   By: Richardean Sale M.D.   On: 04/10/2019 17:12    Cardiac Studies   Nuclear stress test low risk 10/2018  Echocardiogram EF 55 to 60%   Patient  Profile     78 y.o. male right hip surgery, thigh hematoma, A. fib RVR  Assessment & Plan    Atrial fibrillation persistent -Better rate control today after receiving blood yesterday and reinitiation of Toprol 100 mg in addition to his diltiazem 60 every 8.  He states that he feels better today with better rate control.  Chronic anticoagulation -Holding Coumadin in the setting of significant anemia, hemoglobin 7.3 with thigh hematoma -Reinitiate Coumadin once hemoglobin is steady.  He will likely need to hold this for the next 3 to 4 days.  Last 2 hemoglobins went from 7.3-8 0.9-8.3.  Acute on chronic diastolic heart failure - On IV Lasix, Dopplers negative for DVT, BNP 113 on presentation.  Volume overload postoperatively noted.  Lower extremity edema quite significant on presentation.  Okay to continue with IV Lasix today.  Creatinine 0.76.  Improved.  Coronary artery disease status post CABG -Doing well with no anginal symptoms.  Agreed with 1 unit packed  red blood cell yesterday.  Hypokalemia - Receiving potassium.  Hyperlipidemia -High intensity statin therapy.      For questions or updates, please contact [place] Please consult [email or web address] for contact info under        Signed, Candee Furbish, MD  04/12/2019, 8:51 AM

## 2019-04-12 NOTE — Progress Notes (Signed)
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  Pt found to be in afib with RVR and have Macrocytic Anemia (tranfusing one unit PRBCs during PT evaluation).  PMHx: CAD, CABG, chronic A-fib    PT Comments    Pt continues to be motivated to work with PT, however HR extremely tachy with rate up to 160 and therefore gait distance limited by PT this am.   Follow Up Recommendations  No PT follow up     Equipment Recommendations  None recommended by PT    Recommendations for Other Services       Precautions / Restrictions Precautions Precautions: Posterior Hip;Fall Restrictions Weight Bearing Restrictions: No Other Position/Activity Restrictions: WBAT    Mobility  Bed Mobility               General bed mobility comments: pt up in recliner on arrival  Transfers Overall transfer level: Needs assistance Equipment used: Rolling walker (2 wheeled) Transfers: Sit to/from Stand Sit to Stand: Supervision         General transfer comment: utilizes UE self assist, decreased control of descent, reminder for hip precautions  Ambulation/Gait Ambulation/Gait assistance: Min guard;Supervision Gait Distance (Feet): 45 Feet Assistive device: Rolling walker (2 wheeled) Gait Pattern/deviations: Decreased dorsiflexion - right;Decreased stance time - right;Step-through pattern;Wide base of support     General Gait Details: verbal cues for posture, slighlty increased hip/knee flexion on R LE to compensate for decreased active R DF,  pt with incr HR/remains tachy and gait distance  limited by PT d/t HR up to 160   Stairs             Wheelchair Mobility    Modified Rankin (Stroke Patients Only)       Balance                                             Cognition Arousal/Alertness: Awake/alert Behavior During Therapy: WFL for tasks assessed/performed Overall Cognitive Status: Within Functional Limits for tasks assessed                                        Exercises Total Joint Exercises Ankle Circles/Pumps: AAROM;AROM;Both;10 reps Quad Sets: AROM;Both;10 reps Hip ABduction/ADduction: AROM;Right;10 reps Long Arc Quad: AROM;Right;5 reps    General Comments        Pertinent Vitals/Pain Pain Assessment: No/denies pain    Home Living                      Prior Function            PT Goals (current goals can now be found in the care plan section) Acute Rehab PT Goals PT Goal Formulation: With patient Time For Goal Achievement: 04/18/19 Potential to Achieve Goals: Good Progress towards PT goals: Progressing toward goals    Frequency    Min 3X/week      PT Plan Current plan remains appropriate    Co-evaluation              AM-PAC PT "  6 Clicks" Mobility   Outcome Measure  Help needed turning from your back to your side while in a flat bed without using bedrails?: A Little Help needed moving from lying on your back to sitting on the side of a flat bed without using bedrails?: A Little Help needed moving to and from a bed to a chair (including a wheelchair)?: A Little Help needed standing up from a chair using your arms (e.g., wheelchair or bedside chair)?: A Little Help needed to walk in hospital room?: A Little Help needed climbing 3-5 steps with a railing? : A Little 6 Click Score: 18    End of Session Equipment Utilized During Treatment: Gait belt Activity Tolerance: Treatment limited secondary to medical complications (Comment)(incr HR) Patient left: in chair;with call bell/phone within reach;with family/visitor present   PT Visit Diagnosis: Difficulty in walking, not elsewhere classified (R26.2)     Time:  2683-4196 PT Time Calculation (min) (ACUTE ONLY): 15 min  Charges:  $Gait Training: 8-22 mins                     Kenyon Ana, PT  Pager: [phone] Acute Rehab Dept [facility]): [phone]   04/12/2019    [facility] 04/12/2019, 11:34 AM

## 2019-04-12 NOTE — Progress Notes (Signed)
[place] for warfarin - holding for low Hgb Indication: hx atrial fibrillation  Allergies  Allergen Reactions  . Augmentin [Amoxicillin-Pot Clavulanate] Nausea And Vomiting and Other (See Comments)    Has patient had a PCN reaction causing immediate rash, facial/tongue/throat swelling, SOB or lightheadedness with hypotension: Unknown Has patient had a PCN reaction causing severe rash involving mucus membranes or skin necrosis: No Has patient had a PCN reaction that required hospitalization: No Has patient had a PCN reaction occurring within the last 10 years: No If all of the above answers are "NO", then may proceed with Cephalosporin use.    Patient Measurements: Height: 5\' 11"  (180.3 cm) Weight: 233 lb 7.5 oz (105.9 kg) IBW/kg (Calculated) : 75.3  Vital Signs: Temp: 98.7 F (37.1 C) (08/09 0504) Temp Source: Oral (08/09 0504) BP: 116/59 (08/09 0504) Pulse Rate: 89 (08/09 0504)  Labs: Recent Labs    04/10/19 0208  04/10/19 0758 04/11/19 0345 04/11/19 1703 04/12/19 0516  HGB  --    < > 7.5* 7.3* 8.9* 8.3*  HCT  --    < > 22.9* 23.2* 27.3* 25.7*  PLT  --   --  208 226  --  240  LABPROT 16.7*  --   --  17.5*  --  17.6*  INR 1.4*  --   --  1.5*  --  1.5*  CREATININE  --   --  0.86 0.82  --  0.76   < > = values in this interval not displayed.    Estimated Creatinine Clearance: 95.7 mL/min (by C-G formula based on SCr of 0.76 mg/dL).   Medications:  - PTA warfarin regimen: home dose: 7.5 mg daily except 3.75 mg on Mon and Fri per [facility] clinic note on 8/3  Assessment: [name] a 78 y.o M with hx recent right THA revision on 7/27 (discharge on 7/28), heart failure, and chronic afib on warfarin PTA, presented to the ED on 8/5 with c/o SOB and LE swelling.  Chest CTA was negative for PE and LE doppler negative for DVT.  Warfarin resumed on admission.  Today, 04/12/2019: - INR remains subtherapeutic at 1.5 - 8/8 transfusion > Hgb 8.9 >  8.3 (8/9) - no bleeding documented - no significant drug-drug intxns - on cardiac diet, po intake improved  Goal of Therapy:  INR 2-3 Monitor platelets by anticoagulation protocol: Yes   Plan:  - No Warfarin today, holding warfarin till Hgb stable per Cards rec, attending agrees - Continue daily INR - monitor for s/s bleeding  Kennita Pavlovich L 04/12/2019,7:15 AM

## 2019-04-13 ENCOUNTER — Inpatient Hospital Stay (HOSPITAL_COMMUNITY): Payer: Medicare HMO

## 2019-04-13 DIAGNOSIS — R0989 Other specified symptoms and signs involving the circulatory and respiratory systems: Secondary | ICD-10-CM

## 2019-04-13 LAB — RENAL FUNCTION PANEL
Albumin: 3.7 g/dL (ref 3.5–5.0)
Anion gap: 11 (ref 5–15)
BUN: 19 mg/dL (ref 8–23)
CO2: 27 mmol/L (ref 22–32)
Calcium: 8.8 mg/dL — ABNORMAL LOW (ref 8.9–10.3)
Chloride: 100 mmol/L (ref 98–111)
Creatinine, Ser: 0.88 mg/dL (ref 0.61–1.24)
GFR calc Af Amer: >60 mL/min (ref >60)
GFR calc non Af Amer: >60 mL/min (ref >60)
Glucose, Bld: 105 mg/dL — ABNORMAL HIGH (ref 70–99)
Phosphorus: 2.9 mg/dL (ref 2.5–4.6)
Potassium: 4.3 mmol/L (ref 3.5–5.1)
Sodium: 138 mmol/L (ref 135–145)

## 2019-04-13 LAB — CBC WITH DIFFERENTIAL/PLATELET
Abs Immature Granulocytes: 0.26 10*3/uL — ABNORMAL HIGH (ref 0.00–0.07)
Basophils Absolute: 0.1 10*3/uL (ref 0.0–0.1)
Basophils Relative: 0 %
Eosinophils Absolute: 0.7 10*3/uL — ABNORMAL HIGH (ref 0.0–0.5)
Eosinophils Relative: 6 %
HCT: 27.9 % — ABNORMAL LOW (ref 39.0–52.0)
Hemoglobin: 9 g/dL — ABNORMAL LOW (ref 13.0–17.0)
Immature Granulocytes: 2 %
Lymphocytes Relative: 11 %
Lymphs Abs: 1.3 10*3/uL (ref 0.7–4.0)
MCH: 34.2 pg — ABNORMAL HIGH (ref 26.0–34.0)
MCHC: 32.3 g/dL (ref 30.0–36.0)
MCV: 106.1 fL — ABNORMAL HIGH (ref 80.0–100.0)
Monocytes Absolute: 1.1 10*3/uL — ABNORMAL HIGH (ref 0.1–1.0)
Monocytes Relative: 9 %
Neutro Abs: 8 10*3/uL — ABNORMAL HIGH (ref 1.7–7.7)
Neutrophils Relative %: 72 %
Platelets: 270 10*3/uL (ref 150–400)
RBC: 2.63 MIL/uL — ABNORMAL LOW (ref 4.22–5.81)
RDW: 19.2 % — ABNORMAL HIGH (ref 11.5–15.5)
WBC: 11.4 10*3/uL — ABNORMAL HIGH (ref 4.0–10.5)
nRBC: 0.2 % (ref 0.0–0.2)

## 2019-04-13 LAB — PROTIME-INR
INR: 1.4 — ABNORMAL HIGH (ref 0.8–1.2)
Prothrombin Time: 16.5 seconds — ABNORMAL HIGH (ref 11.4–15.2)

## 2019-04-13 MED ORDER — DILTIAZEM HCL ER COATED BEADS 180 MG PO CP24
180.0000 mg | ORAL_CAPSULE | Freq: Every day | ORAL | Status: DC
  Administered 2019-04-13: 180 mg via ORAL
  Filled 2019-04-13: qty 1

## 2019-04-13 MED ORDER — WARFARIN SODIUM 7.5 MG PO TABS: ORAL_TABLET | ORAL | 1 refills | Status: DC

## 2019-04-13 MED ORDER — METOPROLOL SUCCINATE ER 100 MG PO TB24: 100.0000 mg | ORAL_TABLET | Freq: Every day | ORAL | 1 refills | Status: DC

## 2019-04-13 NOTE — Care Management Important Message (Signed)
Important Message  Patient Details  Name: Leonard Cisneros MRN: [id number] Date of Birth: 1941-07-05   Medicare Important Message Given:  Yes. CMA printed out IM for the Case Manager Nurse or CSW to give to the patient.      Lareina Espino 04/13/2019, 11:33 AM

## 2019-04-13 NOTE — Discharge Summary (Addendum)
. Physician Discharge Summary  Leonard Cisneros EAV:[id number] DOB: 02-Dec-1940 DOA: 04/08/2019  PCP: [name], MD  Admit date: 04/08/2019 Discharge date: 04/13/2019  Admitted From: Home Disposition:  Discharged to home.  Recommendations for Outpatient Follow-up:  1. Follow up with PCP in 1-2 weeks 2. Follow up with Cardiology as scheduled. 3. Follow up with Orthopedics as follows.  Discharge Condition: Stable  CODE STATUS: FULL   Brief/Interim Summary: H/o CABG, Afib on coumadin, dCHF who was recently underwent right hip surgery by Dr [name] on 7/27. Returned to the ED due to above complaints. . He reports right thigh become swollen in the last 24-48hrs, he denies pain at rest, does has right sided pain with activity. She also reports feeling sob and fast heart rate.He denies fever, no chest pain. He denies orthopnea.  ED course: he is in afib/rvr, tmax 99.3, bp stable, no hypoxia, CTA negative for PE, right lower extremity US negative for DVT. Wbc 15.8, hgb 9, cr 0.77, bnp 113, high sensitivity troponin is 10, INR 1.9. He is started on cardizem drip, hospitalist called due to patient is in afib/rvr needing cardizem drip.  Patient reports to me, he has been having bilateral lower extremity edema for a few months, some days are worse than others. He was in the hospital in 10/2018 for heart failure and is started on lasix 40mg  daily. He reports does not use extra salt, he drink 6-8 glasses of water daily.  Discharge Diagnoses:  Active Problems:   CAD S/P percutaneous coronary angioplasty   Atrial fibrillation with RVR (HCC)   Acute on chronic diastolic CHF (congestive heart failure) (HCC)   Macrocytic anemia   Afib (HCC)  Afib/RVR - cardizem gtt transitioned to PO; monitor - continue coumadin (INR1.4)  - hold oral betablocker/ccb while on cardizem drip - cardiology consulted; appreciate assistance - has been switched to PO cardizem - can make  an argument that his a fib has been tripped off by blood loss as noted on CT ab/pelvis - let's hold on the coumadin right now per cards; appreciate assistance - going to start metoprolol     - hold coumadin for next couple days; if he holds his Hgb, will resume coumadin slowly w/o heparin brdige     - spoke with cardiology: can hold coumadin through Thursday and then resume without bridge     - toprol xl 100; cardizem CR 240mg   Acute on chronic diastolic CHF - cxr "Cardiomegaly and mild vascular congestion.", mild volume overload on exam - IV lasix 40mg  BID; wean as able  - Fluids restriction  - cardiology consulted; appreciate assistance     - resume home lasix  Leukocytosis - No fever, from recent surgery? - UA negative - no abx at this time; monitor  Right leg/thigh edema - Negative for DVT - He denies pain at rest, no ecchymosis on exam, Monitor h/h - Ortho Dr [name] consulted; no intervention at this time - says his swelling is improved - obtained CT ab/pelvis to r/o peritoneal/retroperitoneal bleeding; none seen; however, noted multiple hematomas in right thigh - spoke with ortho; they will take a look, will likely just need to tamponade     - no ortho intervention; follow up with ortho outpatient  Macrocytic Anemia  - hgb around 11 prior to surgery, hgb 10 on 7/28, hgb today is 9 - mcv chronically elevated but appear progressive from 102 to 109 - Hgb is down to 7.9; no evidence of frank bleed, continue  to trend, consider CT ab/pelvis if he continues to drop - B12/THF wnl - Hgb is down to 7.5.; will check CT ab/pelvis to look for bleed - Hgb settling at 7.3; CT ab/pelvis w/o any peritoneal or retroperitoneal bleed; it does note right thigh hematomas; can make an argument that he is having symptomatic anemia; will transfuse 1 unit pRBCs     - appropriate response to pRBCs; monitor  CAD  s/p CABG - No chest pain, tropnonin negative - Continue home meds asa/statin/bb/imdur  HLD - continue atorvastatin  Discharge Instructions   Allergies as of 04/13/2019      Reactions   Augmentin [amoxicillin-pot Clavulanate] Nausea And Vomiting, Other (See Comments)   Has patient had a PCN reaction causing immediate rash, facial/tongue/throat swelling, SOB or lightheadedness with hypotension: Unknown Has patient had a PCN reaction causing severe rash involving mucus membranes or skin necrosis: No Has patient had a PCN reaction that required hospitalization: No Has patient had a PCN reaction occurring within the last 10 years: No If all of the above answers are "NO", then may proceed with Cephalosporin use.      Medication List    STOP taking these medications   isosorbide mononitrate 30 MG 24 hr tablet Commonly known as: IMDUR   ramipril 2.5 MG capsule Commonly known as: ALTACE     TAKE these medications   atorvastatin 80 MG tablet Commonly known as: LIPITOR Take 1 tablet (80 mg total) by mouth daily for 30 days.   diltiazem 240 MG 24 hr capsule Commonly known as: DILACOR XR Take 1 capsule (240 mg total) by mouth daily.   furosemide 40 MG tablet Commonly known as: LASIX Take one daily What changed:   how much to take  how to take this  when to take this  additional instructions   HYDROcodone-acetaminophen 5-325 MG tablet Commonly known as: NORCO/VICODIN Take 1-2 tablets by mouth every 6 (six) hours as needed for severe pain.   methocarbamol 500 MG tablet Commonly known as: ROBAXIN Take 1 tablet (500 mg total) by mouth every 6 (six) hours as needed for muscle spasms.   metoprolol succinate 100 MG 24 hr tablet Commonly known as: TOPROL-XL Take 1 tablet (100 mg total) by mouth daily. Take with or immediately following a meal. Start taking on: April 14, 2019 What changed:   medication strength  how much to take  additional instructions    nitroGLYCERIN 0.4 MG SL tablet Commonly known as: NITROSTAT DISSOLVE 1 TABLET UNDER THE TONGUE EVERY 5 MINUTES UP TO 15 MINUTES FOR CHEST PAIN What changed:   how much to take  how to take this  when to take this  reasons to take this   potassium chloride 10 MEQ tablet Commonly known as: K-DUR Take one every other day What changed:   how much to take  how to take this  when to take this  additional instructions   traMADol 50 MG tablet Commonly known as: ULTRAM Take 1-2 tablets (50-100 mg total) by mouth every 6 (six) hours as needed for moderate pain.   warfarin 7.5 MG tablet Commonly known as: COUMADIN Take as directed. If you are unsure how to take this medication, talk to your nurse or doctor. Original instructions: Take 1/2 to 1 tablet daily as directed by coumadin clinic Start taking on: April 16, 2019 What changed: These instructions start on April 16, 2019. If you are unsure what to do until then, ask your doctor or other care provider.  Follow-up Information    Erma Heritage, PA-C Follow up on 04/24/2019.   Specialties: Physician Assistant, Cardiology Why: 1:30 PM Post hospital follow-up with Dr. Court Joy PA Contact information: [place] [address] [phone]        [name], MD Follow up.   Specialty: Family Medicine Contact information: [place] [address] [phone]        [name], MD .   Specialty: Cardiology Contact information: [address] [place] [address] [phone]          Allergies  Allergen Reactions  . Augmentin [Amoxicillin-Pot Clavulanate] Nausea And Vomiting and Other (See Comments)    Has patient had a PCN reaction causing immediate rash, facial/tongue/throat swelling, SOB or lightheadedness with hypotension: Unknown Has patient had a PCN reaction causing severe rash involving mucus membranes or skin necrosis: No Has  patient had a PCN reaction that required hospitalization: No Has patient had a PCN reaction occurring within the last 10 years: No If all of the above answers are "NO", then may proceed with Cephalosporin use.     Consultations:  Orthopedics  Cardiology   Procedures/Studies: Ct Angio Chest Pe W And/or Wo Contrast  Result Date: 04/08/2019 CLINICAL DATA:  Shortness of breath, right leg swelling EXAM: CT ANGIOGRAPHY CHEST WITH CONTRAST TECHNIQUE: Multidetector CT imaging of the chest was performed using the standard protocol during bolus administration of intravenous contrast. Multiplanar CT image reconstructions and MIPs were obtained to evaluate the vascular anatomy. CONTRAST:  184mL OMNIPAQUE IOHEXOL 350 MG/ML SOLN COMPARISON:  Same day chest x-ray FINDINGS: Cardiovascular: Postoperative changes from CABG. Cardiomegaly. No pericardial effusion. The central pulmonary vasculature is adequately opacified. Respiratory motion degrades evaluation of the distal pulmonary arterial tree. No filling defect to the level of the lobar branch pulmonary arteries. Mediastinum/Nodes: Mildly prominent mediastinal and right hilar lymph nodes including a precarinal node measuring 10 mm short axis. Unremarkable thyroid gland. Trachea and esophagus within normal limits. Lungs/Pleura: Lungs are clear without focal airspace consolidation, pleural effusion, or pneumothorax. Upper Abdomen: No acute abnormality. Musculoskeletal: No chest wall abnormality. No acute or significant osseous findings. Review of the MIP images confirms the above findings. IMPRESSION: 1. Negative for pulmonary embolism to the lobar branch level. Respiratory motion degrades evaluation of the more distal pulmonary arterial tree. 2. Lungs are clear. 3. Borderline prominent mediastinal and right hilar lymph nodes, nonspecific. 4. Cardiomegaly. Electronically Signed   By: Davina Poke M.D.   On: 04/08/2019 09:30   Ct Abdomen Pelvis W  Contrast  Result Date: 04/10/2019 CLINICAL DATA:  Recent right total hip arthroplasty 03/30/2019. Right pelvic and thigh pain and swelling/hematoma. EXAM: CT ABDOMEN AND PELVIS WITH CONTRAST TECHNIQUE: Multidetector CT imaging of the abdomen and pelvis was performed using the standard protocol following bolus administration of intravenous contrast. CONTRAST:  132mL OMNIPAQUE IOHEXOL 300 MG/ML  SOLN COMPARISON:  Pelvic radiographs 03/30/2019.  Chest CTA 04/08/2019. FINDINGS: Lower chest: Stable cardiomegaly with atherosclerosis of the aorta and coronary arteries. There is no significant pleural or pericardial effusion. The lung bases are clear. Hepatobiliary: The liver is normal in density without suspicious focal abnormality. No evidence of gallstones, gallbladder wall thickening or biliary dilatation. Pancreas: Mild fatty replacement. No focal lesion, ductal dilatation or surrounding inflammation. Spleen: Normal in size without focal abnormality. Adrenals/Urinary Tract: Both adrenal glands appear normal. There is a 1 cm cyst in the upper pole of the left kidney. The kidneys otherwise appear normal.  No evidence of urinary tract calculus or hydronephrosis. The bladder is partly obscured by artifact from the bilateral hip prostheses, but demonstrates no abnormality. Stomach/Bowel: No evidence of bowel wall thickening, distention or surrounding inflammatory change. The appendix appears normal. Vascular/Lymphatic: There are no enlarged abdominal or pelvic lymph nodes. Aortic and branch vessel atherosclerosis. No acute vascular findings. Reproductive: The prostate gland is partly obscured by artifact from the hip prostheses, but does not appear enlarged. Other: No ascites or intra-abdominal inflammatory changes. Musculoskeletal: Status post median sternotomy, lower back surgery and bilateral total hip arthroplasty. No evidence of acute fracture or dislocation. There is multilevel lumbar spondylosis. There is fatty  atrophy within the gluteus and proximal hamstring musculature bilaterally. There is a subcutaneous hematoma posterolateral to the right hip, measuring up to 6.5 cm on axial image 77/2 and 10.2 cm on coronal image 84/5. There is also high density within the right vastus lateralis muscle, measuring up to 12.6 x 4.8 cm on image 107/2, likely an intramuscular hematoma. No intrapelvic hematoma identified. IMPRESSION: 1. High density intramuscular and subcutaneous collections lateral to the right hip consistent with hematomas. No evidence of foreign body or acute osseous findings status post bilateral total hip arthroplasty. 2. No intrapelvic hematoma or inflammatory process. 3. Coronary and Aortic Atherosclerosis (ICD10-I70.0). Electronically Signed   By: Richardean Sale M.D.   On: 04/10/2019 17:12   Dg Pelvis Portable  Result Date: 03/30/2019 CLINICAL DATA:  Post RIGHT hip revision EXAM: PORTABLE PELVIS 1-2 VIEWS COMPARISON:  Portable exam 1818 hours compared to CT 04/21/2018 FINDINGS: BILATERAL hip prostheses identified, with interval revision on RIGHT. A radiopaque ring is seen surrounding the femoral head component, presumed to be part of the acetabular component, new since prior exam, uncertain if in expected position. No acute fracture or dislocation. Bones appear demineralized. Surgical drain overlies the operative site. IMPRESSION: Interval revision of RIGHT hip prosthesis. Note is made of a radiopaque ring surrounding the femoral head component, presumed to be part of the acetabular component though uncertain as to its expected position, recommend correlation. Electronically Signed   By: Lavonia Dana M.D.   On: 03/30/2019 19:05   Dg Chest Portable 1 View  Result Date: 04/08/2019 CLINICAL DATA:  Shortness of breath EXAM: PORTABLE CHEST 1 VIEW COMPARISON:  10/22/2018 FINDINGS: Cardiomegaly with mild vascular prominence centrally. There has been CABG. No edema, consolidation, effusion, or pneumothorax.  IMPRESSION: Cardiomegaly and mild vascular congestion. Electronically Signed   By: Monte Fantasia M.D.   On: 04/08/2019 06:38   Vas [place] Burnard Bunting With/wo Tbi  Result Date: 04/13/2019 LOWER EXTREMITY DOPPLER STUDY Indications: Decreased pulses. High Risk Factors: Hypertension, coronary artery disease.  Comparison Study: 06/30/2012 - "Innacurate test results" Performing Technologist: Carlos Levering RVT  Examination Guidelines: A complete evaluation includes at minimum, Doppler waveform signals and systolic blood pressure reading at the level of bilateral brachial, anterior tibial, and posterior tibial arteries, when vessel segments are accessible. Bilateral testing is considered an integral part of a complete examination. Photoelectric Plethysmograph (PPG) waveforms and toe systolic pressure readings are included as required and additional duplex testing as needed. Limited examinations for reoccurring indications may be performed as noted.  ABI Findings: +---------+------------------+-----+---------+--------+ Right    Rt Pressure (mmHg)IndexWaveform Comment  +---------+------------------+-----+---------+--------+ Brachial 133                    triphasic         +---------+------------------+-----+---------+--------+ PTA      235  1.77 biphasic          +---------+------------------+-----+---------+--------+ DP       235               1.77 biphasic          +---------+------------------+-----+---------+--------+ Great Toe68                0.51                   +---------+------------------+-----+---------+--------+ +---------+------------------+-----+---------+-------+ Left     Lt Pressure (mmHg)IndexWaveform Comment +---------+------------------+-----+---------+-------+ Brachial 122                    triphasic        +---------+------------------+-----+---------+-------+ PTA      240               1.80 triphasic         +---------+------------------+-----+---------+-------+ DP       187               1.41 triphasic        +---------+------------------+-----+---------+-------+ Great Toe35                0.26                  +---------+------------------+-----+---------+-------+ +-------+-----------+-----------+------------+------------+ ABI/TBIToday's ABIToday's TBIPrevious ABIPrevious TBI +-------+-----------+-----------+------------+------------+ Right  1.77       0.51                                +-------+-----------+-----------+------------+------------+ Left   1.8        0.26                                +-------+-----------+-----------+------------+------------+  Summary: Right: Resting right ankle-brachial index indicates noncompressible right lower extremity arteries.The right toe-brachial index is abnormal. Left: Resting left ankle-brachial index indicates noncompressible left lower extremity arteries.The left toe-brachial index is abnormal.  *See table(s) above for measurements and observations.     Preliminary    Vas [place] Lower Extremity Venous (dvt) (only [facility])  Result Date: 04/08/2019  Lower Venous Study Indications: Edema.  Limitations: Body habitus and poor ultrasound/tissue interface. Comparison Study: no prior Performing Technologist: Abram Sander RVS  Examination Guidelines: A complete evaluation includes B-mode imaging, spectral Doppler, color Doppler, and power Doppler as needed of all accessible portions of each vessel. Bilateral testing is considered an integral part of a complete examination. Limited examinations for reoccurring indications may be performed as noted.  +---------+---------------+---------+-----------+----------+--------------+ RIGHT    CompressibilityPhasicitySpontaneityPropertiesSummary        +---------+---------------+---------+-----------+----------+--------------+ CFV      Full           Yes      Yes                                  +---------+---------------+---------+-----------+----------+--------------+ SFJ      Full                                                        +---------+---------------+---------+-----------+----------+--------------+ FV Prox  Full                                                        +---------+---------------+---------+-----------+----------+--------------+  FV Mid                  Yes      Yes                                 +---------+---------------+---------+-----------+----------+--------------+ FV Distal               Yes      Yes                                 +---------+---------------+---------+-----------+----------+--------------+ PFV      Full                                                        +---------+---------------+---------+-----------+----------+--------------+ POP      Full           Yes      Yes                                 +---------+---------------+---------+-----------+----------+--------------+ PTV      Full                                                        +---------+---------------+---------+-----------+----------+--------------+ PERO                                                  Not visualized +---------+---------------+---------+-----------+----------+--------------+   +----+---------------+---------+-----------+----------+-------+ LEFTCompressibilityPhasicitySpontaneityPropertiesSummary +----+---------------+---------+-----------+----------+-------+ CFV Full           Yes      Yes                          +----+---------------+---------+-----------+----------+-------+     Summary: Right: There is no evidence of deep vein thrombosis in the lower extremity. However, portions of this examination were limited- see technologist comments above. No cystic structure found in the popliteal fossa. Left: No evidence of common femoral vein obstruction.  *See table(s) above for measurements and observations.  Electronically signed by Curt Jews MD on 04/08/2019 at 11:10:09 AM.    Final      Subjective: "Can I go?"  Discharge Exam: Vitals:   04/13/19 0523 04/13/19 1357  BP: 107/69 104/68  Pulse: 85 75  Resp: 14 (!) 24  Temp: 98.3 F (36.8 C) 98.2 F (36.8 C)  SpO2: 95% 95%   Vitals:   04/12/19 1435 04/12/19 2106 04/13/19 0523 04/13/19 1357  BP: 116/72 130/76 107/69 104/68  Pulse: 81 88 85 75  Resp: 16 16 14  (!) 24  Temp: 99 F (37.2 C) 98.2 F (36.8 C) 98.3 F (36.8 C) 98.2 F (36.8 C)  TempSrc: Oral Oral Oral Oral  SpO2: 97% 97% 95% 95%  Weight:   104 kg   Height:        General:77 y.o.maleresting in chair in NAD  Eyes: PERRL, normal sclera ENMT: Nares patent w/o discharge, orophaynx clear, dentition normal, ears w/o discharge/lesions/ulcers Cardiovascular:irregular, +S1, S2,1/6 SEM,no g/r, equal pulses throughout Respiratory: CTABL, no w/r/r, normal WOB GI: BS+, NDNT, no masses noted, no organomegaly noted MSK:BLE edema, worse on right; no c/c Skin: No rashes, ulcerations noted Neuro: A&O x 3, no focal deficits Psyc: Appropriate interaction and affect, calm/cooperative    The results of significant diagnostics from this hospitalization (including imaging, microbiology, ancillary and laboratory) are listed below for reference.     Microbiology: Recent Results (from the past 240 hour(s))  SARS CORONAVIRUS 2 Nasal Swab Aptima Multi Swab     Status: None   Collection Time: 04/08/19  5:43 AM   Specimen: Aptima Multi Swab; Nasal Swab  Result Value Ref Range Status   SARS Coronavirus 2 NEGATIVE NEGATIVE Final    Comment: (NOTE) SARS-CoV-2 target nucleic acids are NOT DETECTED. The SARS-CoV-2 RNA is generally detectable in upper and lower respiratory specimens during the acute phase of infection. Negative results do not preclude SARS-CoV-2 infection, do not rule out co-infections with other pathogens, and should not be used as the sole basis for treatment or  other patient management decisions. Negative results must be combined with clinical observations, patient history, and epidemiological information. The expected result is Negative. Fact Sheet for Patients: SugarRoll.be Fact Sheet for Healthcare Providers: https://www.woods-mathews.com/ This test is not yet approved or cleared by the [place] FDA and  has been authorized for detection and/or diagnosis of SARS-CoV-2 by FDA under an Emergency Use Authorization (EUA). This EUA will remain  in effect (meaning this test can be used) for the duration of the COVID-19 declaration under Section 56 4(b)(1) of the Act, 21 U.S.C. section 360bbb-3(b)(1), unless the authorization is terminated or revoked sooner. Performed at [facility], [place] [address] [place], [place] [address]   MRSA PCR Screening     Status: None   Collection Time: 04/08/19  7:04 PM   Specimen: Nasopharyngeal  Result Value Ref Range Status   MRSA by PCR NEGATIVE NEGATIVE Final    Comment:        The GeneXpert MRSA Assay (FDA approved for NASAL specimens only), is one component of a comprehensive MRSA colonization surveillance program. It is not intended to diagnose MRSA infection nor to guide or monitor treatment for MRSA infections. Performed at [facility], [place] [address] [place], [place] [address]   Culture, blood (routine x 2)     Status: None (Preliminary result)   Collection Time: 04/09/19  3:33 PM   Specimen: BLOOD  Result Value Ref Range Status   Specimen Description   Final    BLOOD LEFT ARM Performed at [place] [address] [place], [place] [address]    Special Requests   Final    BOTTLES DRAWN AEROBIC AND ANAEROBIC Blood Culture adequate volume Performed at [place] [address], [place], [place] [address]    Culture   Final    NO GROWTH 4 DAYS Performed at [facility], [place] [address] [place], [place] [address]    Report Status PENDING  Incomplete  Culture, blood (routine x 2)     Status: None (Preliminary result)   Collection Time: 04/09/19  3:33 PM   Specimen: BLOOD  Result Value Ref Range Status   Specimen Description   Final    BLOOD RIGHT HAND Performed at [place] [address] [place],  [place] [address]    Special Requests   Final    BOTTLES DRAWN AEROBIC ONLY Blood Culture results may not be optimal due to an inadequate volume of blood received in culture bottles Performed at [place] [address], [place], [place] [address]    Culture   Final    NO GROWTH 4 DAYS Performed at [facility], [place] [address] [place], [place] [address]    Report Status PENDING  Incomplete     Labs: BNP (last 3 results) Recent Labs    09/30/18 1132 10/23/18 0030 04/08/19 0543  BNP 123.0* 104.0* 324.4*   Basic Metabolic Panel: Recent Labs  Lab 04/09/19 0433 04/10/19 0758 04/11/19 0345 04/12/19 0516 04/13/19 0445  NA 138 137 135 135 138  K 4.1 3.6 3.6 3.0* 4.3  CL 103 100 99 98 100  CO2 25 25 25 28 27   GLUCOSE 135* 118* 108* 120* 105*  BUN 16 18 16 14 19   CREATININE 0.84 0.86 0.82 0.76 0.88  CALCIUM 8.7* 8.4* 8.3* 8.3* 8.8*  MG 2.0  --  2.1 2.0  --   PHOS  --  3.0 3.3 2.7 2.9   Liver Function Tests: Recent Labs  Lab 04/10/19 0758 04/11/19 0345 04/12/19 0516 04/13/19 0445  ALBUMIN 3.2* 3.2* 3.2* 3.7   No results for input(s): LIPASE, AMYLASE in the last 168 hours. No results for input(s): AMMONIA in the last 168 hours. CBC: Recent Labs  Lab 04/08/19 0543 04/09/19 0433 04/10/19 0758 04/11/19 0345 04/11/19 1703 04/12/19 0516 04/13/19 0445  WBC 15.8* 12.5* 11.8* 11.2*  --  12.5* 11.4*  NEUTROABS 11.4*  --  8.7* 7.8*  --   --  8.0*  HGB 9.0* 7.9* 7.5* 7.3* 8.9* 8.3* 9.0*  HCT 28.1* 24.8* 22.9* 23.2* 27.3* 25.7* 27.9*  MCV 109.3* 112.7* 109.6* 110.5*  --  104.0*  106.1*  PLT 251 407* 208 226  --  240 270   Cardiac Enzymes: No results for input(s): CKTOTAL, CKMB, CKMBINDEX, TROPONINI in the last 168 hours. BNP: Invalid input(s): POCBNP CBG: No results for input(s): GLUCAP in the last 168 hours. D-Dimer No results for input(s): DDIMER in the last 72 hours. Hgb A1c No results for input(s): HGBA1C in the last 72 hours. Lipid Profile No results for input(s): CHOL, HDL, LDLCALC, TRIG, CHOLHDL, LDLDIRECT in the last 72 hours. Thyroid function studies No results for input(s): TSH, T4TOTAL, T3FREE, THYROIDAB in the last 72 hours.  Invalid input(s): FREET3 Anemia work up No results for input(s): VITAMINB12, FOLATE, FERRITIN, TIBC, IRON, RETICCTPCT in the last 72 hours. Urinalysis    Component Value Date/Time   COLORURINE YELLOW 04/09/2019 1105   APPEARANCEUR CLEAR 04/09/2019 1105   LABSPEC 1.011 04/09/2019 1105   PHURINE 5.0 04/09/2019 1105   GLUCOSEU NEGATIVE 04/09/2019 1105   HGBUR NEGATIVE 04/09/2019 1105   BILIRUBINUR NEGATIVE 04/09/2019 1105   KETONESUR NEGATIVE 04/09/2019 1105   PROTEINUR NEGATIVE 04/09/2019 1105   UROBILINOGEN 0.2 11/28/2010 0915   NITRITE NEGATIVE 04/09/2019 1105   LEUKOCYTESUR NEGATIVE 04/09/2019 1105   Sepsis Labs Invalid input(s): PROCALCITONIN,  WBC,  LACTICIDVEN Microbiology Recent Results (from the past 240 hour(s))  SARS CORONAVIRUS 2 Nasal Swab Aptima Multi Swab     Status: None   Collection Time: 04/08/19  5:43 AM   Specimen: Aptima Multi Swab; Nasal Swab  Result Value Ref Range Status   SARS Coronavirus 2 NEGATIVE NEGATIVE Final    Comment: (NOTE) SARS-CoV-2 target nucleic acids are NOT DETECTED.  The SARS-CoV-2 RNA is generally detectable in upper and lower respiratory specimens during the acute phase of infection. Negative results do not preclude SARS-CoV-2 infection, do not rule out co-infections with other pathogens, and should not be used as the sole basis for treatment or other patient  management decisions. Negative results must be combined with clinical observations, patient history, and epidemiological information. The expected result is Negative. Fact Sheet for Patients: SugarRoll.be Fact Sheet for Healthcare Providers: https://www.woods-mathews.com/ This test is not yet approved or cleared by the [place] FDA and  has been authorized for detection and/or diagnosis of SARS-CoV-2 by FDA under an Emergency Use Authorization (EUA). This EUA will remain  in effect (meaning this test can be used) for the duration of the COVID-19 declaration under Section 56 4(b)(1) of the Act, 21 U.S.C. section 360bbb-3(b)(1), unless the authorization is terminated or revoked sooner. Performed at [facility], [place] [address] [place], [place] [address]   MRSA PCR Screening     Status: None   Collection Time: 04/08/19  7:04 PM   Specimen: Nasopharyngeal  Result Value Ref Range Status   MRSA by PCR NEGATIVE NEGATIVE Final    Comment:        The GeneXpert MRSA Assay (FDA approved for NASAL specimens only), is one component of a comprehensive MRSA colonization surveillance program. It is not intended to diagnose MRSA infection nor to guide or monitor treatment for MRSA infections. Performed at [facility], [place] [address] [place], [place] [address]   Culture, blood (routine x 2)     Status: None (Preliminary result)   Collection Time: 04/09/19  3:33 PM   Specimen: BLOOD  Result Value Ref Range Status   Specimen Description   Final    BLOOD LEFT ARM Performed at [facility] [address] [place], [place] [address]    Special Requests   Final    BOTTLES DRAWN AEROBIC AND ANAEROBIC Blood Culture adequate volume Performed at [facility] [address] [place], [place] [address]    Culture   Final    NO GROWTH 4 DAYS Performed at [facility], [place]  [address], [place], [place] [address]    Report Status PENDING  Incomplete  Culture, blood (routine x 2)     Status: None (Preliminary result)   Collection Time: 04/09/19  3:33 PM   Specimen: BLOOD  Result Value Ref Range Status   Specimen Description   Final    BLOOD RIGHT HAND Performed at [facility] [address] [place], [place] [address]    Special Requests   Final    BOTTLES DRAWN AEROBIC ONLY Blood Culture results may not be optimal due to an inadequate volume of blood received in culture bottles Performed at [place] [address] [place], [place] [address]    Culture   Final    NO GROWTH 4 DAYS Performed at [facility], [place] [address], [place], [place] [address]    Report Status PENDING  Incomplete     Time coordinating discharge: 35 minutes  SIGNED:   Jonnie Finner, DO  [facility] 04/13/2019, 2:20 PM Pager   If 7PM-7AM, please contact night-coverage [email or web address] Password [email or web address]

## 2019-04-13 NOTE — Progress Notes (Signed)
ABI's have been completed. Preliminary results can be found in CV Proc through chart review.   04/13/19 11:27 AM Leonard Cisneros RVT

## 2019-04-13 NOTE — Progress Notes (Signed)
[place] for warfarin - holding for low Hgb Indication: hx atrial fibrillation  Allergies  Allergen Reactions  . Augmentin [Amoxicillin-Pot Clavulanate] Nausea And Vomiting and Other (See Comments)    Has patient had a PCN reaction causing immediate rash, facial/tongue/throat swelling, SOB or lightheadedness with hypotension: Unknown Has patient had a PCN reaction causing severe rash involving mucus membranes or skin necrosis: No Has patient had a PCN reaction that required hospitalization: No Has patient had a PCN reaction occurring within the last 10 years: No If all of the above answers are "NO", then may proceed with Cephalosporin use.    Patient Measurements: Height: 5\' 11"  (180.3 cm) Weight: 229 lb 4.5 oz (104 kg) IBW/kg (Calculated) : 75.3  Vital Signs: Temp: 98.3 F (36.8 C) (08/10 0523) Temp Source: Oral (08/10 0523) BP: 107/69 (08/10 0523) Pulse Rate: 85 (08/10 0523)  Labs: Recent Labs    04/11/19 0345 04/11/19 1703 04/12/19 0516 04/13/19 0445 04/13/19 0446  HGB 7.3* 8.9* 8.3* 9.0*  --   HCT 23.2* 27.3* 25.7* 27.9*  --   PLT 226  --  240 270  --   LABPROT 17.5*  --  17.6*  --  16.5*  INR 1.5*  --  1.5*  --  1.4*  CREATININE 0.82  --  0.76 0.88  --     Estimated Creatinine Clearance: 86.3 mL/min (by C-G formula based on SCr of 0.88 mg/dL).   Medications:  - PTA warfarin regimen: home dose: 7.5 mg daily except 3.75 mg on Mon and Fri per [facility] clinic note on 8/3  Assessment: [name] a 78 y.o M with hx recent right THA revision on 7/27 (discharge on 7/28), heart failure, and chronic afib on warfarin PTA, presented to the ED on 8/5 with c/o SOB and LE swelling.  Chest CTA was negative for PE and LE doppler negative for DVT.  Warfarin resumed on admission.  Today, 04/13/2019: - INR remains subtherapeutic - no bleeding documented - no significant drug-drug intxns - on cardiac diet, po intake improved  Goal of Therapy:   INR 2-3 Monitor platelets by anticoagulation protocol: Yes   Plan:  - No Warfarin today, holding warfarin till Thursday per Cardiology service - Continue daily INR - monitor for s/s bleeding  Jakylan Ron A 04/13/2019,1:53 PM

## 2019-04-13 NOTE — Progress Notes (Addendum)
Progress Note  Patient Name: Leonard Cisneros Date of Encounter: 04/13/2019  Primary Cardiologist: [name], MD   Subjective   No chest pain or SOB, has ambulated in room and with PT. HR up ot 160 w/ ambulation, limiting factor.  Thigh much softer, can still palpate hematoma outer thigh  Not aware of atrial fib, even when HR high  Inpatient Medications    Scheduled Meds: . atorvastatin  80 mg Oral Daily  . Chlorhexidine Gluconate Cloth  6 each Topical Q0600  . diltiazem  180 mg Oral Daily  . furosemide  40 mg Intravenous BID  . metoprolol succinate  100 mg Oral Daily  . potassium chloride  40 mEq Oral BID  . Warfarin - Pharmacist Dosing Inpatient   Does not apply q1800   Continuous Infusions:  PRN Meds: acetaminophen, HYDROcodone-acetaminophen   Vital Signs    Vitals:   04/12/19 0940 04/12/19 1435 04/12/19 2106 04/13/19 0523  BP:  116/72 130/76 107/69  Pulse: (!) 103 81 88 85  Resp:  16 16 14   Temp:  99 F (37.2 C) 98.2 F (36.8 C) 98.3 F (36.8 C)  TempSrc:  Oral Oral Oral  SpO2:  97% 97% 95%  Weight:    104 kg  Height:        Intake/Output Summary (Last 24 hours) at 04/13/2019 0901 Last data filed at 04/13/2019 5465 Gross per 24 hour  Intake 240 ml  Output 1700 ml  Net -1460 ml   Last 3 Weights 04/13/2019 04/12/2019 04/10/2019  Weight (lbs) 229 lb 4.5 oz 233 lb 7.5 oz 235 lb 14.3 oz  Weight (kg) 104 kg 105.9 kg 107 kg      Telemetry    Afib, rate generally controlled, episodic RVR likely related to activity -  Personally Reviewed  ECG    None today - Personally Reviewed  Physical Exam   General: Well developed, well nourished, male in no acute distress Head: Eyes PERRLA Normocephalic and atraumatic Lungs: Clear bilaterally to auscultation. Heart: Irreg R&R S1 S2, without rub or gallop. Soft murmur.  Pulses are 2+ & equal upper extremities.  Decreased pulses lower extremities w/ delayed cap refill.  Minimal JVD. Abdomen: Bowel sounds are  present, abdomen soft and non-tender without masses or  hernias noted. Msk: Normal strength and tone for age. Extremities: No clubbing, cyanosis or edema.    Skin:  No rashes or lesions noted. Wounds are dressed Neuro: Alert and oriented X 3. Psych:  Good affect, responds appropriately  Labs    High Sensitivity Troponin:   Recent Labs  Lab 04/08/19 0543  TROPONINIHS 10    Chemistry Recent Labs  Lab 04/11/19 0345 04/12/19 0516 04/13/19 0445  NA 135 135 138  K 3.6 3.0* 4.3  CL 99 98 100  CO2 25 28 27   GLUCOSE 108* 120* 105*  BUN 16 14 19   CREATININE 0.82 0.76 0.88  CALCIUM 8.3* 8.3* 8.8*  ALBUMIN 3.2* 3.2* 3.7  GFRNONAA >60 >60 >60  GFRAA >60 >60 >60  ANIONGAP 11 9 11      Hematology Recent Labs  Lab 04/11/19 0345 04/11/19 1703 04/12/19 0516 04/13/19 0445  WBC 11.2*  --  12.5* 11.4*  RBC 2.10*  --  2.47* 2.63*  HGB 7.3* 8.9* 8.3* 9.0*  HCT 23.2* 27.3* 25.7* 27.9*  MCV 110.5*  --  104.0* 106.1*  MCH 34.8*  --  33.6 34.2*  MCHC 31.5  --  32.3 32.3  RDW 15.7*  --  19.5*  19.2*  PLT 226  --  240 270    BNP Recent Labs  Lab 04/08/19 0543  BNP 113.3*    Lab Results  Component Value Date   INR 1.4 (H) 04/13/2019   INR 1.5 (H) 04/12/2019   INR 1.5 (H) 04/11/2019     Radiology    No results found.  Cardiac Studies   Nuclear stress test low risk 10/2018  Echocardiogram EF 55 to 60%   Patient Profile     78 y.o. male right hip surgery, admitted 08/05 w/ R thigh hematoma, A. fib RVR  Assessment & Plan    Atrial fibrillation persistent - pta was on Toprol XL 75 mg and Dilt 240 mg - now on Toprol XL 100 mg and Dilt 60 mg q 8>>change to 180 mg qd - rate control may be easier w/ improved Hgb  Chronic anticoagulation - Coumadin on hold, restart once bleeding stops and H&H stable - do not need to test INR till coumadin restarted  Acute on chronic diastolic heart failure - no DVT by Doppler - BNP 113 on admit - post-op volume overload on  admit s/p diuresis - I/O net -7.9 L - wt 238>>229 lbs - f/u on BMET results, pending - resp status improved, should be able to go to po Lasix soon  Coronary artery disease status post CABG - no CP, SOB improved w/ diuresis - no ischemic sx w/ activity  Hypokalemia - supplemented, f/u on BMET  ABL Anemia: - s/p 1 u PRBCs for H&H, 7.3/23.2 - continue to follow CBC  Hyperlipidemia - continue Lipitor 80 mg qd  PAD - 2013 ABIs were abnl, MRA recommended - will recheck, f/u as outpt  For questions or updates, please contact [facility] Please consult [email or web address] for contact info under        Signed, Rosaria Ferries, PA-C  04/13/2019, 9:01 AM    Feels well, no SOB, no palps.   GEN: Well nourished, well developed, in no acute distress  HEENT: normal  Neck: no JVD, carotid bruits, or masses Cardiac: irreg; no murmurs, rubs, or gallops,no edema  Respiratory:  clear to auscultation bilaterally, normal work of breathing GI: soft, nontender, nondistended, + BS MS: no deformity or atrophy, thigh hematoma softer Skin: warm and dry, no rash Neuro:  Alert and Oriented x 3, Strength and sensation are intact Psych: euthymic mood, full affect  TELE: occasional rapid AFIB  AFIB Anemia post op PAD CAD post CABG    - discussed with Dr. Marylyn Ishihara. Since Hgb is stable, I would be comfortable with DC home. Would hold coumadin and restart on Thursday with no bridge given recent bleeding.   - Check INR again on next Monday.   - When DC, sent out with Toprol 100 and Dilt 240   - Patient is amenable to plan.   Candee Furbish, MD

## 2019-04-14 LAB — CULTURE, BLOOD (ROUTINE X 2)
Culture: NO GROWTH
Culture: NO GROWTH
Special Requests: ADEQUATE

## 2019-04-20 ENCOUNTER — Ambulatory Visit (INDEPENDENT_AMBULATORY_CARE_PROVIDER_SITE_OTHER): Payer: Medicare HMO | Admitting: Family Medicine

## 2019-04-20 ENCOUNTER — Other Ambulatory Visit: Payer: Self-pay

## 2019-04-20 DIAGNOSIS — I878 Other specified disorders of veins: Secondary | ICD-10-CM | POA: Diagnosis not present

## 2019-04-20 DIAGNOSIS — I482 Chronic atrial fibrillation, unspecified: Secondary | ICD-10-CM

## 2019-04-20 NOTE — Progress Notes (Signed)
   Subjective:    Patient ID: Leonard Cisneros, male    DOB: 1941-04-19, 78 y.o.   MRN: [id number]  HPI Patient calls for a follow up on recent hospitalization for a fib. Patient has follow up with cardiology on Friday. Patient states he is doing better since discharge.  Virtual Visit via Video Note  I connected with Leonard Cisneros on 04/20/19 at 11:00 AM EDT by a video enabled telemedicine application and verified that I am speaking with the correct person using two identifiers.  Location: Patient: home Provider: office   I discussed the limitations of evaluation and management by telemedicine and the availability of in person appointments. The patient expressed understanding and agreed to proceed.  History of Present Illness:    Observations/Objective:   Assessment and Plan:   Follow Up Instructions:    I discussed the assessment and treatment plan with the patient. The patient was provided an opportunity to ask questions and all were answered. The patient agreed with the plan and demonstrated an understanding of the instructions.   The patient was advised to call back or seek an in-person evaluation if the symptoms worsen or if the condition fails to improve as anticipated.  I provided 20 minutes of non-face-to-face time during this encounter.       Review of Systems No headache, no major weight loss or weight gain, no chest pain no back pain abdominal pain no change in bowel habits complete ROS otherwise negative     Objective:   Physical Exam  Alert vitals stable, NAD. Blood pressure good on repeat. HEENT normal. Lungs clear. Heart regular controlled rate and not regular rhythm. Ankles trace edema      Assessment & Plan:  Impression status post hospitalization for exacerbation of A. fib and elements of CHF.  Compliance with meds discussed diet discussed.  Close follow-up with cardiologist encouraged

## 2019-04-24 ENCOUNTER — Ambulatory Visit (INDEPENDENT_AMBULATORY_CARE_PROVIDER_SITE_OTHER): Payer: Medicare HMO | Admitting: Student

## 2019-04-24 ENCOUNTER — Other Ambulatory Visit: Payer: Self-pay

## 2019-04-24 ENCOUNTER — Encounter: Payer: Self-pay | Admitting: Student

## 2019-04-24 ENCOUNTER — Ambulatory Visit: Payer: Medicare HMO | Admitting: Student

## 2019-04-24 VITALS — BP 126/74 | HR 80 | Temp 97.5°F | Ht 71.0 in | Wt 238.0 lb

## 2019-04-24 DIAGNOSIS — I739 Peripheral vascular disease, unspecified: Secondary | ICD-10-CM

## 2019-04-24 DIAGNOSIS — E785 Hyperlipidemia, unspecified: Secondary | ICD-10-CM

## 2019-04-24 DIAGNOSIS — I2510 Atherosclerotic heart disease of native coronary artery without angina pectoris: Secondary | ICD-10-CM

## 2019-04-24 DIAGNOSIS — I10 Essential (primary) hypertension: Secondary | ICD-10-CM | POA: Diagnosis not present

## 2019-04-24 DIAGNOSIS — I5032 Chronic diastolic (congestive) heart failure: Secondary | ICD-10-CM | POA: Diagnosis not present

## 2019-04-24 DIAGNOSIS — I482 Chronic atrial fibrillation, unspecified: Secondary | ICD-10-CM

## 2019-04-24 MED ORDER — POTASSIUM CHLORIDE ER 10 MEQ PO TBCR: EXTENDED_RELEASE_TABLET | ORAL | 1 refills | Status: DC

## 2019-04-24 MED ORDER — FUROSEMIDE 40 MG PO TABS: ORAL_TABLET | ORAL | 1 refills | Status: DC

## 2019-04-24 MED ORDER — METOPROLOL SUCCINATE ER 100 MG PO TB24: 100.0000 mg | ORAL_TABLET | Freq: Every day | ORAL | 3 refills | Status: DC

## 2019-04-24 NOTE — Progress Notes (Signed)
Cardiology Office Note    Date:  04/25/2019   ID:  Leonard Cisneros, DOB 16-May-1941, MRN [id number]  PCP:  Leonard Kirschner, MD  Cardiologist: [name], MD  --> wishes to switch to the [facility] due to location   Chief Complaint  Patient presents with  . Hospitalization Follow-up    History of Present Illness:    Leonard Cisneros is a 78 y.o. male with past medical history of CAD (s/p CABG in 2003 with cath in 2017 showing patent LIMA-LAD, patent RIMA-dRCA, and 99% stenosis of SVG-RI treated with BMS, low-risk NST in 10/2018), permanent atrial fibrillation, HTN, and HLD who presents to the office today for hospital follow-up.  He most recently had a telehealth visit with myself in 12/2018 and denied any recent chest pain or dyspnea on exertion at that time. He was having intermittent palpitations and it was recommended that he take his Cardizem CD in the AM and Toprol-XL in the PM.  In the interim, he was admitted to [facility] in 03/2019 and underwent total right hip arthroplasty revision on 03/30/2019 with no immediate complications noted. He presented back to the ED on 04/08/2019 for evaluation of worsening dyspnea and lower extremity edema since his recent surgery. Ultrasound imaging was negative for a DVT and CTA showed no evidence of a PE. BNP was slightly elevated to 113 and he was found to be in atrial fibrillation with RVR and required IV Cardizem. He did have a hematoma along his thigh and required 1 unit PRBC's as Hgb had declined to 7.3 (stable at 9.0 upon discharge). He was eventually transitioned from IV Cardizem to Toprol-XL 100 mg daily and Cardizem CD 240mg  daily at the time of discharge. Ramipril and Imdur were held given soft BP.  Was recommended to restart Coumadin 3 days following discharge given his hematoma.  Follow-Up ABI's were obtained on 04/13/2019 and abnormal as the right ABI indicated noncompressible right lower extremity arteries and the same with the  left.   In talking with the patient today, he reports overall doing well from a cardiac perspective since his hospitalization. Breathing has been at baseline and he denies any specific dyspnea on exertion, orthopnea, or PND. No recent chest pain or palpitations. HR has overall been in the 70's to 90's when checked at home.   He has experienced intermittent lower extremity edema. Weight had overall been stable until today when it increased from 232 lbs to 236 lbs on his home scales. Follows a low-sodium diet. Reports good compliance with Lasix 40mg  daily.    Past Medical History:  Diagnosis Date  . Atrial flutter ([place])    a. s/p remote ablation by Dr. [name].  [name] CAD (coronary artery disease)    a. s/p CABG 2003. b. 01/2016: unstable angina - s/p BMS to SVG-ramus intermediate, patent LIMA-mLAD, patent RIMA-dRCA.   [name] CHF (congestive heart failure) ([place]) 11/2010   seen at [facility] then started lasix  . Chronic atrial fibrillation    a. initially post op CABG then chronic. On Coumadin.  . Chronic lower back pain   . Dysrhythmia 2000   4 or 5 cardioversions.  . Essential hypertension   . High cholesterol   . Ischemic cardiomyopathy    a. Cath 01/2016 -  EF 50-55% with mild distal inferior hypocontractility.  . Mild aortic valve stenosis    PER ECHO 10-23-2018 IN EPIC   . Unspecified hereditary and idiopathic peripheral neuropathy 04/04/2014    Past Surgical  History:  Procedure Laterality Date  . BACK SURGERY    . CARDIAC CATHETERIZATION  ~ 2000; 2003  . CARDIAC CATHETERIZATION N/A 02/01/2016   Procedure: Left Heart Cath and Cors/Grafts Angiography;  Surgeon: [name], MD;  Location: [place] [facility];  Service: Cardiovascular;  Laterality: N/A;  . CARDIAC CATHETERIZATION N/A 02/01/2016   Procedure: Coronary Stent Intervention;  Surgeon: [name], MD;  Location: [place] [facility];  Service: Cardiovascular;  Laterality: N/A;  . CATARACT EXTRACTION W/PHACO Left 02/09/2019   Procedure:  CATARACT EXTRACTION PHACO AND INTRAOCULAR LENS PLACEMENT (IOC);  Surgeon: [name], MD;  Location: [facility];  Service: Ophthalmology;  Laterality: Left;  CDE: 27.70  . CATARACT EXTRACTION W/PHACO Right 02/23/2019   Procedure: CATARACT EXTRACTION PHACO AND INTRAOCULAR LENS PLACEMENT RIGHT EYE;  Surgeon: [name], MD;  Location: [facility];  Service: Ophthalmology;  Laterality: Right;  CDE: 10.15  . COLONOSCOPY  06/04/2002   Normal colon and rectum  . COLONOSCOPY  07/08/2012   Procedure: COLONOSCOPY;  Surgeon: [name], MD;  Location: [facility];  Service: Endoscopy;  Laterality: N/A;  11:30  . COLONOSCOPY N/A 10/02/2017   Procedure: COLONOSCOPY;  Surgeon: [name], MD;  Location: [facility];  Service: Endoscopy;  Laterality: N/A;  10:30am  . CORONARY ANGIOPLASTY WITH STENT PLACEMENT  02/01/2016  . CORONARY ARTERY BYPASS GRAFT  2003   LIMA to LAD,LIMA to distal RCA,SVG to ramus intermediate vessel.  . FEMORAL REVISION Right 03/30/2019   Procedure: Femoral revision right total hip arthroplasty;  Surgeon: [name], MD;  Location: [facility];  Service: Orthopedics;  Laterality: Right;  179min  . FOREIGN BODY REMOVAL Right 04/02/2014   Procedure: FOREIGN BODY REMOVAL RIGHT FOOT;  Surgeon: [name], MD;  Location: [facility];  Service: General;  Laterality: Right;  . JOINT REPLACEMENT    . JOINT REPLACEMENT    . LUMBAR DISC SURGERY    . REVISION TOTAL HIP ARTHROPLASTY Bilateral 2005-2012   right-left  . SHOULDER OPEN ROTATOR CUFF REPAIR Right   . TOTAL HIP ARTHROPLASTY Right 1999  . TOTAL HIP ARTHROPLASTY Left 2008  . 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    Current Medications: Outpatient Medications Prior to Visit  Medication Sig Dispense Refill  . diltiazem (DILACOR XR) 240 MG 24  hr capsule Take 1 capsule (240 mg total) by mouth daily. 90 capsule 3  . HYDROcodone-acetaminophen (NORCO/VICODIN) 5-325 MG tablet Take 1-2 tablets by mouth every 6 (six) hours as needed for severe pain. 56 tablet 0  . methocarbamol (ROBAXIN) 500 MG tablet Take 1 tablet (500 mg total) by mouth every 6 (six) hours as needed for muscle spasms. 40 tablet 0  . nitroGLYCERIN (NITROSTAT) 0.4 MG SL tablet DISSOLVE 1 TABLET UNDER THE TONGUE EVERY 5 MINUTES UP TO 15 MINUTES FOR CHEST PAIN (Patient taking differently: Place 0.4 mg under the tongue every 5 (five) minutes as needed for chest pain. DISSOLVE 1 TABLET UNDER THE TONGUE EVERY 5 MINUTES UP TO 15 MINUTES FOR CHEST PAIN) 25 tablet 0  . traMADol (ULTRAM) 50 MG tablet Take 1-2 tablets (50-100 mg total) by mouth every 6 (six) hours as needed for moderate pain. 40 tablet 0  . warfarin (COUMADIN) 7.5 MG tablet Take 1/2 to 1 tablet daily as directed by coumadin clinic 90 tablet  1  . furosemide (LASIX) 40 MG tablet Take one daily (Patient taking differently: Take 40 mg by mouth daily. ) 90 tablet 1  . metoprolol succinate (TOPROL-XL) 100 MG 24 hr tablet Take 1 tablet (100 mg total) by mouth daily. Take with or immediately following a meal. 30 tablet 1  . potassium chloride (K-DUR) 10 MEQ tablet Take one every other day (Patient taking differently: Take 10 mEq by mouth every other day. ) 45 tablet 1  . atorvastatin (LIPITOR) 80 MG tablet Take 1 tablet (80 mg total) by mouth daily for 30 days. 90 tablet 1   No facility-administered medications prior to visit.      Allergies:   Augmentin [amoxicillin-pot clavulanate]   Social History   Socioeconomic History  . Marital status: Married    Spouse name: Not on file  . Number of children: 2  . Years of education: Not on file  . Highest education level: Not on file  Occupational History  . 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 Financial resource strain: Not hard at all   . Food insecurity    Worry: Never true    Inability: Never true  . Transportation needs    Medical: No    Non-medical: No  Tobacco Use  . Smoking status: Former Smoker    Packs/day: 0.50    Years: 2.00    Pack years: 1.00    Types: Cigarettes    Quit date: 09/03/1966    Years since quitting: 52.6  . Smokeless tobacco: Never Used  Substance and Sexual Activity  . Alcohol use: Yes    Alcohol/week: 11.0 standard drinks    Types: 7 Glasses of wine, 4 Cans of beer per week    Comment: a glass of wine with dinner/ beer on the weekend  . Drug use: No  . Sexual activity: Yes  Lifestyle  . Physical activity    Days per week: Not on file    Minutes per session: Not on file  . Stress: To some extent  Relationships  . Social connections    Talks on phone: More than three times a week    Gets together: Three times a week    Attends religious service: Patient refused    Active member of club or organization: Patient refused    Attends meetings of clubs or organizations: Patient refused    Relationship status: Married  Other Topics Concern  . Not on file  Social History Narrative  . Not on file     Family History:  The patient's family history includes Heart attack in his brother.   Review of Systems:   Please see the history of present illness.     General:  No chills, fever, night sweats or weight changes.  Cardiovascular:  No chest pain, dyspnea on exertion, orthopnea, palpitations, paroxysmal nocturnal dyspnea. Positive for lower extremity edema.  Dermatological: No rash, lesions/masses Respiratory: No cough, dyspnea Urologic: No hematuria, dysuria Abdominal:   No nausea, vomiting, diarrhea, bright red blood per rectum, melena, or hematemesis Neurologic:  No visual changes, wkns, changes in mental status. All other systems reviewed and are otherwise negative except as noted above.   Physical Exam:    VS:  BP 126/74   Pulse 80   Temp (!) 97.5 F (36.4 C)   Ht 5\' 11"   (1.803 m)   Wt 238 lb (108 kg)   BMI 33.19 kg/m    General: Well  developed, well nourished,male appearing in no acute distress. Head: Normocephalic, atraumatic, sclera non-icteric, no xanthomas, nares are without discharge.  Neck: No carotid bruits. JVD not elevated.  Lungs: Respirations regular and unlabored, without wheezes or rales.  Heart: Irregularly irregular. No S3 or S4.  No murmur, no rubs, or gallops appreciated. Abdomen: Soft, non-tender, non-distended with normoactive bowel sounds. No hepatomegaly. No rebound/guarding. No obvious abdominal masses. Msk:  Strength and tone appear normal for age. No joint deformities or effusions. Extremities: No clubbing or cyanosis. 1+ pitting edema along RLE, trace edema along LLE.  Distal pedal pulses are 2+ bilaterally. Neuro: Alert and oriented X 3. Moves all extremities spontaneously. No focal deficits noted. Psych:  Responds to questions appropriately with a normal affect. Skin: No rashes or lesions noted  Wt Readings from Last 3 Encounters:  04/24/19 238 lb (108 kg)  04/13/19 229 lb 4.5 oz (104 kg)  03/30/19 238 lb 1 oz (108 kg)     Studies/Labs Reviewed:   EKG:  EKG is not ordered today.   Recent Labs: 03/16/2019: ALT 33 04/08/2019: B Natriuretic Peptide 113.3 04/09/2019: TSH 1.321 04/12/2019: Magnesium 2.0 04/13/2019: BUN 19; Creatinine, Ser 0.88; Hemoglobin 9.0; Platelets 270; Potassium 4.3; Sodium 138   Lipid Panel    Component Value Date/Time   CHOL 126 10/23/2018 0050   CHOL 187 06/03/2018 0749   TRIG 53 10/23/2018 0050   HDL 74 10/23/2018 0050   HDL 77 06/03/2018 0749   CHOLHDL 1.7 10/23/2018 0050   VLDL 11 10/23/2018 0050   LDLCALC 41 10/23/2018 0050   LDLCALC 95 06/03/2018 0749    Additional studies/ records that were reviewed today include:   NST: 10/2018  There was no ST segment deviation noted during stress.  The study is normal. There are no perfusion defects  This is a low risk study.  The left  ventricular ejection fraction is normal (55-65%).    Echocardiogram: 10/2018 IMPRESSIONS   1. The left ventricle has normal systolic function, with an ejection fraction of 55-60%. The cavity size was normal. There is moderately increased left ventricular wall thickness. Left ventricular diastology could not be evaluated secondary to atrial  fibrillation.  2. The right ventricle has normal systolic function. The cavity was normal. There is no increase in right ventricular wall thickness.  3. Left atrial size was severely dilated.  4. Right atrial size was moderately dilated.  5. The mitral valve is normal in structure. Moderate thickening of the mitral valve leaflet. No evidence of mitral valve stenosis.  6. The tricuspid valve is normal in structure.  7. The aortic valve is tricuspid Moderate thickening of the aortic valve mild stenosis of the aortic valve.  8. The aortic root is normal in size and structure.  9. Pulmonary hypertension is indeterminant, inadequate TR jet. 10. The inferior vena cava was dilated in size with >50% respiratory variability.  ABI: 04/2019 Summary: Right: Resting right ankle-brachial index indicates noncompressible right lower extremity arteries.The right toe-brachial index is abnormal. ABIs are unreliable.  Left: Resting left ankle-brachial index indicates noncompressible left lower extremity arteries.The left toe-brachial index is abnormal. ABIs are unreliable.  Assessment:    1. Coronary artery disease involving native coronary artery of native heart without angina pectoris   2. Chronic atrial fibrillation   3. Chronic diastolic heart failure ([place])   4. Essential hypertension   5. Hyperlipidemia LDL goal <70   6. PAD (peripheral artery disease) ([place])      Plan:   In order  of problems listed above:  1. CAD - s/p CABG in 2003 with cath in 2017 showing patent LIMA-LAD, patent RIMA-dRCA, and 99% stenosis of SVG-RI treated with BMS. Most recent NST in  10/2018 was low-risk. He denies any recent chest pain and breathing has been at baseline.  - continue BB and statin therapy. Not on ASA given the need for anticoagulation.   2. Chronic Atrial Fibrillation - HR initially elevated in the low-100's when walking into the office, rechecked and at 80 bpm. Has been stable when checked at home. Continue current regimen with Cardizem CD 240mg  daily and Toprol-XL 100mg  daily.  - he denies any evidence of active bleeding. Continue Coumadin for anticoagulation.   3. Chronic Diastolic CHF - he denies any recent orthopnea or PND but does experience intermittent lower extremity edema. Remains on Lasix 40mg  daily and I encouraged him to follow daily weights and to take an extra tablet as needed for weight gain greater than 2-3 lbs overnight or 5 lbs in one week.   4. HTN - BP well-controlled at 126/74 during today's visit. - continue current regimen with Cardizem CD and Toprol-XL. Will not restart Ramipril or Imdur at this time as BP remains stable.   5. HLD - FLP in 10/2018 showed total cholesterol of 126, triglycerides 53, HDL 74, and LDL 41. - continue Atorvastatin 80mg  daily.   6. PAD - ABI's earlier this month were abnormal as the right ABI indicated noncompressible right lower extremity arteries and the same with the left. He has intermittent pain along his legs which he has accredited to his arthritis but no specific claudication symptoms. Will refer to Dr. Fletcher Anon or Dr. Gwenlyn Found for consideration of further evaluation. - continue statin therapy. Not on ASA given the use of Coumadin.    Medication Adjustments/Labs and Tests Ordered: Current medicines are reviewed at length with the patient today.  Concerns regarding medicines are outlined above.  Medication changes, Labs and Tests ordered today are listed in the Patient Instructions below. Patient Instructions  Medication Instructions:  You may take an extra lasix on days as needed for edema  On days  you take extra lasix, please take extra potassium   Labwork: none  Testing/Procedures: none  Follow-Up: Your physician recommends that you schedule a follow-up appointment in: 2-3 months    Any Other Special Instructions Will Be Listed Below (If Applicable).  If you need a refill on your cardiac medications before your next appointment, please call your pharmacy.  Limit daily fluid intake to less than 2 Liters per day. Please limit salt intake.  Please weight yourself every morning. Take an extra Lasix tablet if weight increases by 3 pounds overnight or 5 pounds in a single week.   Signed, Erma Heritage, PA-C  04/25/2019 9:24 AM    [facility] [address] [place], [place] [address] Phone: [phone] Fax: [phone]

## 2019-04-24 NOTE — Patient Instructions (Addendum)
Medication Instructions:  You may take an extra lasix on days as needed for edema  On days you take extra lasix, please take extra potassium   Labwork: none  Testing/Procedures: none  Follow-Up: Your physician recommends that you schedule a follow-up appointment in: 2-3 months    Any Other Special Instructions Will Be Listed Below (If Applicable).     If you need a refill on your cardiac medications before your next appointment, please call your pharmacy.     Limit daily fluid intake to less than 2 Liters per day. Please limit salt intake.  Please weight yourself every morning. Take an extra Lasix tablet if weight increases by 3 pounds overnight or 5 pounds in a single week.

## 2019-04-29 ENCOUNTER — Telehealth: Payer: Self-pay | Admitting: *Deleted

## 2019-04-29 DIAGNOSIS — I739 Peripheral vascular disease, unspecified: Secondary | ICD-10-CM

## 2019-04-29 NOTE — Telephone Encounter (Signed)
Referral placed.

## 2019-05-06 ENCOUNTER — Other Ambulatory Visit: Payer: Self-pay | Admitting: Cardiovascular Disease

## 2019-05-06 LAB — COAGUCHEK XS/INR WAIVED
INR: 1.8 — ABNORMAL HIGH (ref 0.9–1.1)
Prothrombin Time: 21.7 s

## 2019-05-07 ENCOUNTER — Ambulatory Visit (INDEPENDENT_AMBULATORY_CARE_PROVIDER_SITE_OTHER): Payer: Medicare HMO | Admitting: Pharmacist Clinician (PhC)/ Clinical Pharmacy Specialist

## 2019-05-07 DIAGNOSIS — Z7901 Long term (current) use of anticoagulants: Secondary | ICD-10-CM | POA: Diagnosis not present

## 2019-05-07 DIAGNOSIS — Z96641 Presence of right artificial hip joint: Secondary | ICD-10-CM | POA: Diagnosis not present

## 2019-05-07 DIAGNOSIS — Z471 Aftercare following joint replacement surgery: Secondary | ICD-10-CM | POA: Diagnosis not present

## 2019-05-07 DIAGNOSIS — I482 Chronic atrial fibrillation, unspecified: Secondary | ICD-10-CM

## 2019-05-26 ENCOUNTER — Other Ambulatory Visit: Payer: Self-pay

## 2019-05-26 DIAGNOSIS — I4891 Unspecified atrial fibrillation: Secondary | ICD-10-CM

## 2019-05-26 DIAGNOSIS — Z7901 Long term (current) use of anticoagulants: Secondary | ICD-10-CM

## 2019-06-02 ENCOUNTER — Ambulatory Visit (INDEPENDENT_AMBULATORY_CARE_PROVIDER_SITE_OTHER): Payer: Medicare HMO | Admitting: Cardiovascular Disease

## 2019-06-02 ENCOUNTER — Other Ambulatory Visit: Payer: Self-pay

## 2019-06-02 ENCOUNTER — Encounter: Payer: Self-pay | Admitting: Cardiovascular Disease

## 2019-06-02 VITALS — BP 116/68 | HR 101 | Temp 97.0°F | Ht 71.0 in | Wt 234.8 lb

## 2019-06-02 DIAGNOSIS — I482 Chronic atrial fibrillation, unspecified: Secondary | ICD-10-CM

## 2019-06-02 DIAGNOSIS — I739 Peripheral vascular disease, unspecified: Secondary | ICD-10-CM | POA: Diagnosis not present

## 2019-06-02 DIAGNOSIS — I2510 Atherosclerotic heart disease of native coronary artery without angina pectoris: Secondary | ICD-10-CM | POA: Diagnosis not present

## 2019-06-02 DIAGNOSIS — E785 Hyperlipidemia, unspecified: Secondary | ICD-10-CM

## 2019-06-02 NOTE — Progress Notes (Signed)
Cardiology Office Note   Date:  06/02/2019   ID:  Leonard Cisneros, DOB 08-31-1941, MRN [id number]  PCP:  [name], MD  Cardiologist:  Dr. Claiborne Billings  No chief complaint on file.     History of Present Illness: Leonard Cisneros is a 78 y.o. male who was referred for evaluation and management of peripheral arterial disease.  He has known history of coronary artery disease status post CABG in 2003 with subsequent stenting of SVG to ramus, permanent atrial fibrillation, chronic diastolic heart failure, essential hypertension and hyperlipidemia. He had right hip replacement in July.  This was complicated by hematoma that required transfusion. The patient underwent lower extremity arterial Doppler in August which showed noncompressible vessel with abnormal TBI bilaterally especially on the left.  However, waveforms were mostly triphasic. The patient denies calf or thigh claudication.  His mobility is somewhat limited because of arthritis.  He is currently using crutches because of pain in his right knee.  He has no lower extremity ulceration.  He is not diabetic and is not a smoker.   Past Medical History:  Diagnosis Date  . Atrial flutter ([place])    a. s/p remote ablation by Dr. [name].  [name] CAD (coronary artery disease)    a. s/p CABG 2003. b. 01/2016: unstable angina - s/p BMS to SVG-ramus intermediate, patent LIMA-mLAD, patent RIMA-dRCA.   [name] CHF (congestive heart failure) ([place]) 11/2010   seen at [facility] then started lasix  . Chronic atrial fibrillation    a. initially post op CABG then chronic. On Coumadin.  . Chronic lower back pain   . Dysrhythmia 2000   4 or 5 cardioversions.  . Essential hypertension   . High cholesterol   . Ischemic cardiomyopathy    a. Cath 01/2016 -  EF 50-55% with mild distal inferior hypocontractility.  . Mild aortic valve stenosis    PER ECHO 10-23-2018 IN [facility]   . Unspecified hereditary and idiopathic peripheral neuropathy 04/04/2014    Past Surgical  History:  Procedure Laterality Date  . BACK SURGERY    . CARDIAC CATHETERIZATION  ~ 2000; 2003  . CARDIAC CATHETERIZATION N/A 02/01/2016   Procedure: Left Heart Cath and Cors/Grafts Angiography;  Surgeon: [name], MD;  Location: [facility];  Service: Cardiovascular;  Laterality: N/A;  . CARDIAC CATHETERIZATION N/A 02/01/2016   Procedure: Coronary Stent Intervention;  Surgeon: [name], MD;  Location: [facility];  Service: Cardiovascular;  Laterality: N/A;  . CATARACT EXTRACTION W/PHACO Left 02/09/2019   Procedure: CATARACT EXTRACTION PHACO AND INTRAOCULAR LENS PLACEMENT (IOC);  Surgeon: [name], MD;  Location: [facility];  Service: Ophthalmology;  Laterality: Left;  CDE: 27.70  . CATARACT EXTRACTION W/PHACO Right 02/23/2019   Procedure: CATARACT EXTRACTION PHACO AND INTRAOCULAR LENS PLACEMENT RIGHT EYE;  Surgeon: [name], MD;  Location: [facility];  Service: Ophthalmology;  Laterality: Right;  CDE: 10.15  . COLONOSCOPY  06/04/2002   Normal colon and rectum  . COLONOSCOPY  07/08/2012   Procedure: COLONOSCOPY;  Surgeon: [name], MD;  [facility];  Service: Endoscopy;  Laterality: N/A;  11:30  . COLONOSCOPY N/A 10/02/2017   Procedure: COLONOSCOPY;  Surgeon: [name], MD;  [facility];  Service: Endoscopy;  Laterality: N/A;  10:30am  . CORONARY ANGIOPLASTY WITH STENT PLACEMENT  02/01/2016  . CORONARY ARTERY BYPASS GRAFT  2003   LIMA to LAD,LIMA to distal RCA,SVG to ramus intermediate vessel.  [name]  FEMORAL REVISION Right 03/30/2019   Procedure: Femoral revision right total hip arthroplasty;  Surgeon: [name], MD;  Location: [facility];  Service: Orthopedics;  Laterality: Right;  124min  . FOREIGN BODY REMOVAL Right 04/02/2014   Procedure: FOREIGN BODY REMOVAL RIGHT FOOT;  Surgeon: [name], MD;  Location: [facility];  Service: General;  Laterality: Right;  . JOINT REPLACEMENT    . JOINT REPLACEMENT    . LUMBAR DISC SURGERY    .  REVISION TOTAL HIP ARTHROPLASTY Bilateral 2005-2012   right-left  . SHOULDER OPEN ROTATOR CUFF REPAIR Right   . TOTAL HIP ARTHROPLASTY Right 1999  . TOTAL HIP ARTHROPLASTY Left 2008  . 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     Current Outpatient Medications  Medication Sig Dispense Refill  . diltiazem (DILACOR XR) 240 MG 24 hr capsule Take 1 capsule (240 mg total) by mouth daily. 90 capsule 3  . metoprolol succinate (TOPROL-XL) 100 MG 24 hr tablet Take 1 tablet (100 mg total) by mouth daily. Take with or immediately following a meal. 90 tablet 3  . nitroGLYCERIN (NITROSTAT) 0.4 MG SL tablet DISSOLVE 1 TABLET UNDER THE TONGUE EVERY 5 MINUTES UP TO 15 MINUTES FOR CHEST PAIN (Patient taking differently: Place 0.4 mg under the tongue every 5 (five) minutes as needed for chest pain. DISSOLVE 1 TABLET UNDER THE TONGUE EVERY 5 MINUTES UP TO 15 MINUTES FOR CHEST PAIN) 25 tablet 0  . warfarin (COUMADIN) 7.5 MG tablet Take 1/2 to 1 tablet daily as directed by coumadin clinic 90 tablet 1  . atorvastatin (LIPITOR) 80 MG tablet Take 1 tablet (80 mg total) by mouth daily for 30 days. 90 tablet 1   No current facility-administered medications for this visit.     Allergies:   Augmentin [amoxicillin-pot clavulanate]    Social History:  The patient  reports that he quit smoking about 52 years ago. His smoking use included cigarettes. He has a 1.00 pack-year smoking history. He has never used smokeless tobacco. He reports current alcohol use of about 11.0 standard drinks of alcohol per week. He reports that he does not use drugs.   Family History:  The patient's family history includes Heart attack in his brother.    ROS:  Please see the history of present illness.   Otherwise, review of systems are positive for none.    All other systems are reviewed and negative.    PHYSICAL EXAM: VS:  BP 116/68   Pulse (!) 101   Temp (!) 97 F (36.1 C)   Ht 5\' 11"  (1.803 m)   Wt 234 lb 12.8 oz (106.5 kg)   SpO2 97%   BMI 32.75 kg/m  , BMI Body mass index is 32.75 kg/m. GEN: Well nourished, well developed, in no acute distress  HEENT: normal  Neck: no JVD, carotid bruits, or masses Cardiac: Irregularly irregular; no rubs, or gallops, 2 out of 6 systolic murmur in the aortic area.  He has moderate bilateral leg edema Respiratory:  clear to auscultation bilaterally, normal work of breathing GI: soft, nontender, nondistended, + BS MS: no deformity or atrophy  Skin: warm and dry, no rash Neuro:  Strength and sensation are intact Psych: euthymic mood, full affect   EKG:  EKG is not ordered today.    Recent Labs: 03/16/2019: ALT 33 04/08/2019: B  Natriuretic Peptide 113.3 04/09/2019: TSH 1.321 04/12/2019: Magnesium 2.0 04/13/2019: BUN 19; Creatinine, Ser 0.88; Hemoglobin 9.0; Platelets 270; Potassium 4.3; Sodium 138    Lipid Panel    Component Value Date/Time   CHOL 126 10/23/2018 0050   CHOL 187 06/03/2018 0749   TRIG 53 10/23/2018 0050   HDL 74 10/23/2018 0050   HDL 77 06/03/2018 0749   CHOLHDL 1.7 10/23/2018 0050   VLDL 11 10/23/2018 0050   LDLCALC 41 10/23/2018 0050   LDLCALC 95 06/03/2018 0749      Wt Readings from Last 3 Encounters:  06/02/19 234 lb 12.8 oz (106.5 kg)  04/24/19 238 lb (108 kg)  04/13/19 229 lb 4.5 oz (104 kg)      No flowsheet data found.    ASSESSMENT AND PLAN:  1.  Peripheral arterial disease: Based on his Doppler testing, I suspect that he has small vessel disease mainly below the knee with significant calcifications.  His femoral pulses are normal and thus I doubt inflow disease.  Given that currently he has no claudication or lower extremity ulceration, I recommend continuing medical therapy and risk factor modifications. He can follow-up with me as needed if he  becomes symptomatic from a PAD standpoint.  2.  Chronic atrial fibrillation: Ventricular rate seems to be reasonably controlled on current medications and he is on anticoagulation with warfarin.  3.  Coronary artery disease: Status post CABG: Currently with no anginal symptoms.  4.  Hyperlipidemia: On high-dose atorvastatin 80 mg daily with most recent LDL is 41.    Disposition:   FU with me as needed  Signed,  Kathlyn Sacramento, MD  06/02/2019 9:28 AM    [place]

## 2019-06-02 NOTE — Patient Instructions (Signed)
Medication Instructions:  Your physician recommends that you continue on your current medications as directed. Please refer to the Current Medication list given to you today.  If you need a refill on your cardiac medications before your next appointment, please call your pharmacy.   Lab work: None ordered If you have labs (blood work) drawn today and your tests are completely normal, you will receive your results only by: [place] (if you have MyChart) OR A paper copy in the mail If you have any lab test that is abnormal or we need to change your treatment, we will call you to review the results.  Testing/Procedures: None ordered  Follow-Up: Follow up as needed with Dr. Fletcher Anon

## 2019-06-03 ENCOUNTER — Ambulatory Visit (INDEPENDENT_AMBULATORY_CARE_PROVIDER_SITE_OTHER): Payer: Medicare HMO | Admitting: Orthopedic Surgery

## 2019-06-03 ENCOUNTER — Ambulatory Visit: Payer: Medicare HMO

## 2019-06-03 VITALS — BP 133/77 | HR 79 | Temp 97.0°F | Ht 70.0 in | Wt 234.0 lb

## 2019-06-03 DIAGNOSIS — G8929 Other chronic pain: Secondary | ICD-10-CM

## 2019-06-03 DIAGNOSIS — M25561 Pain in right knee: Secondary | ICD-10-CM

## 2019-06-03 DIAGNOSIS — M76899 Other specified enthesopathies of unspecified lower limb, excluding foot: Secondary | ICD-10-CM | POA: Diagnosis not present

## 2019-06-03 LAB — PROTIME-INR
INR: 4.5 — ABNORMAL HIGH (ref 0.8–1.2)
Prothrombin Time: 45.5 s — ABNORMAL HIGH (ref 9.1–12.0)

## 2019-06-03 NOTE — Progress Notes (Signed)
Leonard Cisneros  06/03/2019  HISTORY SECTION :  Chief Complaint  Patient presents with  . Knee Pain    Right knee pain.   HPI 78 year old male with multiple medical problems recently had a revision of his right total hip on July 27 had a femoral revision right total hip arthroplasty in [place].  He presented back to the ER on August 5 with atrial fibrillation was admitted and then discharged August 10 with a postop diagnosis of Atrial fibrillation Acute on chronic diastolic heart failure Macrocytic anemia Coronary artery disease Note post operative CT scan showed no retroperitoneal hematoma or bleeding but several right thigh hematomas which evaluated by orthopedics, they felt this would tamponade on its own.  Now comes in with pain over his right abductor musculature with swelling of his calf lower leg SINCE SURGERY.  Review of Systems  Genitourinary: Positive for flank pain.  Psychiatric/Behavioral: Positive for depression and suicidal ideas.  All other systems reviewed and are negative.    has a past medical history of Atrial flutter ([place]), CAD (coronary artery disease), CHF (congestive heart failure) ([place]) (11/2010), Chronic atrial fibrillation, Chronic lower back pain, Dysrhythmia (2000), Essential hypertension, High cholesterol, Ischemic cardiomyopathy, Mild aortic valve stenosis, and Unspecified hereditary and idiopathic peripheral neuropathy (04/04/2014).   Past Surgical History:  Procedure Laterality Date  . BACK SURGERY    . CARDIAC CATHETERIZATION  ~ 2000; 2003  . CARDIAC CATHETERIZATION N/A 02/01/2016   Procedure: Left Heart Cath and Cors/Grafts Angiography;  Surgeon: [name], MD;  Location: [facility];  Service: Cardiovascular;  Laterality: N/A;  . CARDIAC CATHETERIZATION N/A 02/01/2016   Procedure: Coronary Stent Intervention;  Surgeon: [name], MD;  Location: [facility];  Service: Cardiovascular;  Laterality: N/A;  . CATARACT EXTRACTION  W/PHACO Left 02/09/2019   Procedure: CATARACT EXTRACTION PHACO AND INTRAOCULAR LENS PLACEMENT (IOC);  Surgeon: [name], MD;  Location: [facility];  Service: Ophthalmology;  Laterality: Left;  CDE: 27.70  . CATARACT EXTRACTION W/PHACO Right 02/23/2019   Procedure: CATARACT EXTRACTION PHACO AND INTRAOCULAR LENS PLACEMENT RIGHT EYE;  Surgeon: [name], MD;  [facility];  Service: Ophthalmology;  Laterality: Right;  CDE: 10.15  . COLONOSCOPY  06/04/2002   Normal colon and rectum  . COLONOSCOPY  07/08/2012   Procedure: COLONOSCOPY;  Surgeon: [name], MD;  [facility];  Service: Endoscopy;  Laterality: N/A;  11:30  . COLONOSCOPY N/A 10/02/2017   Procedure: COLONOSCOPY;  Surgeon: [name], MD;  [facility];  Service: Endoscopy;  Laterality: N/A;  10:30am  . CORONARY ANGIOPLASTY WITH STENT PLACEMENT  02/01/2016  . CORONARY ARTERY BYPASS GRAFT  2003   LIMA to LAD,LIMA to distal RCA,SVG to ramus intermediate vessel.  . FEMORAL REVISION Right 03/30/2019   Procedure: Femoral revision right total hip arthroplasty;  Surgeon: [name], MD;  [facility];  Service: Orthopedics;  Laterality: Right;  128min  . FOREIGN BODY REMOVAL Right 04/02/2014   Procedure: FOREIGN BODY REMOVAL RIGHT FOOT;  Surgeon: [name], MD;  Location: [facility];  Service: General;  Laterality: Right;  . JOINT REPLACEMENT    . JOINT REPLACEMENT    . LUMBAR DISC SURGERY    . REVISION TOTAL HIP ARTHROPLASTY Bilateral 2005-2012   right-left  . SHOULDER OPEN ROTATOR CUFF REPAIR Right   . TOTAL HIP ARTHROPLASTY Right 1999  . TOTAL HIP ARTHROPLASTY Left 2008  . US ECHOCARDIOGRAPHY  11/13/2011   LV mildly  dilated,mild concentric LVH,LA mod - severely dilated,RA mildly dilated,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    Body mass index is 33.58 kg/m.   Allergies  Allergen Reactions  . Augmentin  [Amoxicillin-Pot Clavulanate] Nausea And Vomiting and Other (See Comments)    Has patient had a PCN reaction causing immediate rash, facial/tongue/throat swelling, SOB or lightheadedness with hypotension: Unknown Has patient had a PCN reaction causing severe rash involving mucus membranes or skin necrosis: No Has patient had a PCN reaction that required hospitalization: No Has patient had a PCN reaction occurring within the last 10 years: No If all of the above answers are "NO", then may proceed with Cephalosporin use.      Current Outpatient Medications:  .  diltiazem (DILACOR XR) 240 MG 24 hr capsule, Take 1 capsule (240 mg total) by mouth daily., Disp: 90 capsule, Rfl: 3 .  metoprolol succinate (TOPROL-XL) 100 MG 24 hr tablet, Take 1 tablet (100 mg total) by mouth daily. Take with or immediately following a meal., Disp: 90 tablet, Rfl: 3 .  nitroGLYCERIN (NITROSTAT) 0.4 MG SL tablet, DISSOLVE 1 TABLET UNDER THE TONGUE EVERY 5 MINUTES UP TO 15 MINUTES FOR CHEST PAIN (Patient taking differently: Place 0.4 mg under the tongue every 5 (five) minutes as needed for chest pain. DISSOLVE 1 TABLET UNDER THE TONGUE EVERY 5 MINUTES UP TO 15 MINUTES FOR CHEST PAIN), Disp: 25 tablet, Rfl: 0 .  warfarin (COUMADIN) 7.5 MG tablet, Take 1/2 to 1 tablet daily as directed by coumadin clinic, Disp: 90 tablet, Rfl: 1 .  atorvastatin (LIPITOR) 80 MG tablet, Take 1 tablet (80 mg total) by mouth daily for 30 days., Disp: 90 tablet, Rfl: 1   PHYSICAL EXAM SECTION: 1) BP 133/77   Pulse 79   Temp (!) 97 F (36.1 C)   Ht 5\' 10"  (1.778 m)   Wt 234 lb (106.1 kg)   BMI 33.58 kg/m   Body mass index is 33.58 kg/m. General appearance: Well-developed well-nourished no gross deformities  2) Cardiovascular both lower extremities are warm to touch.  Color the skin is red bilaterally.  He has swelling of the right calf compared to the left bilateral peripheral edema  3) Neurologically deep tendon reflexes are equal and  normal, no sensation loss or deficits no pathologic reflexes  4) Psychological: Awake alert and oriented x3 mood and affect normal  5) Skin no lacerations or ulcerations no nodularity no palpable masses, no erythema or nodularity  6) Musculoskeletal:   Right lower extremity evaluation  Right knee nontender no effusion flexion arc of motion 120 degrees knee stable  The patient has swelling of his right leg some of his left leg as well but more right than left.  Today increased rubor to his skin bilaterally.  He has tenderness over the medial hamstring musculature including the sartorius gracilis semitendinosus with palpable tenderness towards the groin which increases with AB duction  Hip is stable flexion is normal  No limb alignment deformity. MEDICAL DECISION SECTION:  Encounter Diagnoses  Name Primary?  . Chronic pain of right knee Yes  . Adductor tendinitis     Imaging Right knee film was taken secondary to history of osteoarthritis of the right knee and complaints of right knee pain despite physical exam showing no tenderness or swelling of the right knee joint  Plan:  (Rx., Inj., surg., Frx, MRI/CT, XR:2)  OBSERVATION THIS IS LIKELY TENDERNESS AND SWELLING RELATED TO  SURGERY AND POST OP BLEEDING   RECOMMEND HEATING PAD AND FU WITH ORTHO AS D/W DR [name]  10:52 AM Arther Abbott, MD  06/03/2019

## 2019-06-03 NOTE — Patient Instructions (Addendum)
ADDUCTOR STRAIN : apply heat and ben gay    Adductor Muscle Strain  An adductor muscle strain, also called a groin strain or pull, is an injury to the muscles or tendons on the upper, inner part of the thigh. These muscles are called the adductor muscles or groin muscles. They are responsible for moving the legs across the body or pulling the legs together. A muscle strain occurs when a muscle is overstretched and some muscle fibers are torn. An adductor muscle strain can range from mild to severe, depending on how many muscle fibers are affected and whether the muscle fibers are partially or completely torn. What are the causes? Adductor muscle strains usually occur during exercise or while participating in sports. The injury often happens when a sudden, violent force is placed on a muscle, stretching the muscle too far. A strain is more likely to happen when your muscles are not warmed up or if you are not properly conditioned. This injury may be caused by:  Stretching the adductor muscles too far or too suddenly, often during side-to-side motion with a sudden change in direction.  Putting repeated stress on the adductor muscles over a long period of time.  Performing vigorous activity without properly stretching the adductor muscles beforehand. What are the signs or symptoms? Symptoms of this condition include:  Pain and tenderness in the groin area. This begins as sharp pain and persists as a dull ache.  A popping or snapping feeling when the injury occurs (for severe strains).  Swelling or bruising.  Muscle spasms.  Weakness in the leg.  Stiffness in the groin area with decreased ability to move the affected muscles. How is this diagnosed? This condition may be diagnosed based on:  Your symptoms and a description of how the injury occurred.  A physical exam.  Imaging tests, such as: ? X-rays. These are sometimes needed to rule out a broken bone or cartilage problems. ? An  ultrasound, CT scan, or MRI. These may be done if your health care provider suspects a complete muscle tear or needs to check for other injuries. How is this treated? An adductor strain will often heal on its own. If needed, this condition may be treated with:  PRICE therapy. PRICE stands for protection of the injured area, rest, ice, pressure (compression), and elevation.  Medicines to help manage pain and swelling (anti-inflammatory medicines).  Crutches. You may be directed to use these for the first few days to minimize your pain. Depending on the severity of the muscle strain, recovery time may vary from a few weeks to several months. Severe injuries often require 4-6 weeks for recovery. In those cases, complete healing can take 4-5 months. Follow these instructions at home: [place] the muscle from being injured again.  Rest. Do not use the strained muscle if it causes pain.  If directed, put ice on the injured area: ? Put ice in a plastic bag. ? Place a towel between your skin and the bag. ? Leave the ice on for 20 minutes, 2-3 times a day. Do this for the first 2 days after the injury.  Apply compression by wrapping the injured area with an elastic bandage as told by your health care provider.  Raise (elevate) the injured area above the level of your heart while you are sitting or lying down. General instructions  Take over-the-counter and prescription medicines only as told by your health care provider.  Walk, stretch, and do exercises as  told by your health care provider. Only do these activities if you can do so without any pain.  Follow your treatment plan as told by your health care provider. This may include: ? Physical therapy. ? Massage. ? Local electrical stimulation (transcutaneous electrical nerve stimulation, TENS). How is this prevented?  Warm up and stretch before being active.  Cool down and stretch after being active.  Give your body  time to rest between periods of activity.  Make sure to use equipment that fits you.  Be safe and responsible while being active to avoid slips and falls.  Maintain physical fitness, including: ? Proper conditioning in the adductor muscles. ? Overall strength, flexibility, and endurance. Contact a health care provider if:  You have increased pain or swelling in the affected area.  Your symptoms are not improving or they are getting worse. Summary  An adductor muscle strain, also called a groin strain or pull, is an injury to the muscles or tendons on the upper, inner part of the thigh.  A muscle strain occurs when a muscle is overstretched and some muscle fibers are torn.  Depending on the severity of the muscle strain, recovery time may vary from a few weeks to several months. This information is not intended to replace advice given to you by your health care provider. Make sure you discuss any questions you have with your health care provider. Document Released: 04/17/2004 Document Revised: 12/09/2018 Document Reviewed: 01/20/2018 Elsevier Patient Education  2020 [employer].

## 2019-06-08 ENCOUNTER — Ambulatory Visit (INDEPENDENT_AMBULATORY_CARE_PROVIDER_SITE_OTHER): Payer: Medicare HMO | Admitting: Pharmacist

## 2019-06-08 DIAGNOSIS — I482 Chronic atrial fibrillation, unspecified: Secondary | ICD-10-CM

## 2019-06-08 DIAGNOSIS — Z7901 Long term (current) use of anticoagulants: Secondary | ICD-10-CM

## 2019-06-10 DIAGNOSIS — Z96641 Presence of right artificial hip joint: Secondary | ICD-10-CM | POA: Insufficient documentation

## 2019-07-02 ENCOUNTER — Other Ambulatory Visit: Payer: Self-pay | Admitting: Cardiovascular Disease

## 2019-07-02 DIAGNOSIS — Z7901 Long term (current) use of anticoagulants: Secondary | ICD-10-CM | POA: Diagnosis not present

## 2019-07-02 LAB — COAGUCHEK XS/INR WAIVED
INR: 3.4 — ABNORMAL HIGH (ref 0.9–1.1)
Prothrombin Time: 40.4 s

## 2019-07-02 LAB — POCT INR: INR: 3.4 — AB (ref 2.0–3.0)

## 2019-07-09 ENCOUNTER — Ambulatory Visit (INDEPENDENT_AMBULATORY_CARE_PROVIDER_SITE_OTHER): Payer: Medicare HMO | Admitting: Cardiology

## 2019-07-09 DIAGNOSIS — Z7901 Long term (current) use of anticoagulants: Secondary | ICD-10-CM

## 2019-07-09 DIAGNOSIS — I482 Chronic atrial fibrillation, unspecified: Secondary | ICD-10-CM | POA: Diagnosis not present

## 2019-07-15 ENCOUNTER — Ambulatory Visit (INDEPENDENT_AMBULATORY_CARE_PROVIDER_SITE_OTHER): Payer: Medicare HMO | Admitting: Family Medicine

## 2019-07-15 ENCOUNTER — Other Ambulatory Visit: Payer: Self-pay

## 2019-07-15 VITALS — BP 138/82 | Temp 97.2°F | Ht 70.0 in | Wt 241.0 lb

## 2019-07-15 DIAGNOSIS — Z23 Encounter for immunization: Secondary | ICD-10-CM

## 2019-07-15 DIAGNOSIS — R21 Rash and other nonspecific skin eruption: Secondary | ICD-10-CM | POA: Diagnosis not present

## 2019-07-15 DIAGNOSIS — I4891 Unspecified atrial fibrillation: Secondary | ICD-10-CM | POA: Diagnosis not present

## 2019-07-15 DIAGNOSIS — I10 Essential (primary) hypertension: Secondary | ICD-10-CM

## 2019-07-15 DIAGNOSIS — Z0000 Encounter for general adult medical examination without abnormal findings: Secondary | ICD-10-CM

## 2019-07-15 NOTE — Progress Notes (Signed)
Subjective:    Patient ID: Leonard Cisneros, male    DOB: 11-24-1940, 78 y.o.   MRN: [id number]  HPI  The patient comes in today for a wellness visit.    A review of their health history was completed.  A review of medications was also completed.  Any needed refills; yes  Eating habits: as good as can  Falls/  MVA accidents in past few months: no this year  Regular exercise: trying to get outside and exercises for back and hip  Specialist pt sees on regular basis: cardiologist and Dr Elmyra Ricks for hip and Dr Aline Brochure  Preventative health issues were discussed.   Additional concerns: legs are in bad shape  Had right hip revision per dr Wynelle Link  Having difficult time with rcovery   Using cane for abalance   Has a fib   Followed by cardiologist  Diet overall pretty good   Needs flu shot today      Review of Systems  Constitutional: Negative for activity change, appetite change and fever.  HENT: Negative for congestion and rhinorrhea.   Eyes: Negative for discharge.  Respiratory: Negative for cough and wheezing.   Cardiovascular: Negative for chest pain.  Gastrointestinal: Negative for abdominal pain, blood in stool and vomiting.  Genitourinary: Negative for difficulty urinating and frequency.  Musculoskeletal: Negative for neck pain.  Skin: Negative for rash.  Allergic/Immunologic: Negative for environmental allergies and food allergies.  Neurological: Negative for weakness and headaches.  Psychiatric/Behavioral: Negative for agitation.  All other systems reviewed and are negative.      Objective:   Physical Exam Vitals signs reviewed.  Constitutional:      Appearance: He is well-developed.  HENT:     Head: Normocephalic and atraumatic.     Right Ear: External ear normal.     Left Ear: External ear normal.     Nose: Nose normal.  Eyes:     Pupils: Pupils are equal, round, and reactive to light.  Neck:     Musculoskeletal: Normal range of  motion and neck supple.     Thyroid: No thyromegaly.  Cardiovascular:     Rate and Rhythm: Normal rate and regular rhythm.     Heart sounds: Normal heart sounds. No murmur.  Pulmonary:     Effort: Pulmonary effort is normal. No respiratory distress.     Breath sounds: Normal breath sounds. No wheezing.  Abdominal:     General: Bowel sounds are normal. There is no distension.     Palpations: Abdomen is soft. There is no mass.     Tenderness: There is no abdominal tenderness.  Genitourinary:    Penis: Normal.   Musculoskeletal: Normal range of motion.  Lymphadenopathy:     Cervical: No cervical adenopathy.  Skin:    General: Skin is warm and dry.     Findings: No erythema.  Neurological:     Mental Status: He is alert.     Motor: No abnormal muscle tone.  Psychiatric:        Behavior: Behavior normal.        Judgment: Judgment normal.    Actually heart rhythm is fibrillation with good rate control       Assessment & Plan:  Impression 1 wellness exam.  Diet discussed.  Exercise discussed.  Vaccines discussed and flu shot administered.  2.  Hyperlipidemia.  Numbers good on last evaluation no need for recheck discussed  3.  Atrial fibrillation clinically stable.  Followed at the Coumadin clinic  4.  Chronic skin edema/irritation/inflammation both legs.  Likely multifactorial due to venous stasis plus sun sensitization from Lasix.  Plus slight increase of bleeding due to Coumadin.  Also exacerbated by elements of chronic congestive heart failure.  Discussed.  5.  Patient also facing swallowing challenging recovery after recent right hip revision surgery.  Discussed

## 2019-07-28 ENCOUNTER — Other Ambulatory Visit: Payer: Self-pay | Admitting: Cardiovascular Disease

## 2019-07-28 MED ORDER — ATORVASTATIN CALCIUM 80 MG PO TABS: 80.0000 mg | ORAL_TABLET | Freq: Every day | ORAL | 2 refills | Status: DC

## 2019-07-28 NOTE — Telephone Encounter (Signed)
°*  STAT* If patient is at the pharmacy, call can be transferred to refill team.   1. Which medications need to be refilled? (please list name of each medication and dose if known)  Atorvastatin  2. Which pharmacy/location (including street and city if local pharmacy) is medication to be sent to[name] [place]  3. Do they need a 30 day or 90 day supply? 90 days and refills

## 2019-08-12 DIAGNOSIS — M21371 Foot drop, right foot: Secondary | ICD-10-CM | POA: Diagnosis not present

## 2019-08-17 ENCOUNTER — Other Ambulatory Visit: Payer: Self-pay

## 2019-08-17 DIAGNOSIS — I482 Chronic atrial fibrillation, unspecified: Secondary | ICD-10-CM

## 2019-08-18 ENCOUNTER — Encounter: Payer: Self-pay | Admitting: Cardiovascular Disease

## 2019-08-18 ENCOUNTER — Telehealth (INDEPENDENT_AMBULATORY_CARE_PROVIDER_SITE_OTHER): Payer: Medicare HMO | Admitting: Cardiovascular Disease

## 2019-08-18 VITALS — BP 122/78 | HR 78 | Ht 71.0 in | Wt 230.0 lb

## 2019-08-18 DIAGNOSIS — I10 Essential (primary) hypertension: Secondary | ICD-10-CM

## 2019-08-18 DIAGNOSIS — I739 Peripheral vascular disease, unspecified: Secondary | ICD-10-CM

## 2019-08-18 DIAGNOSIS — I4821 Permanent atrial fibrillation: Secondary | ICD-10-CM

## 2019-08-18 DIAGNOSIS — I25708 Atherosclerosis of coronary artery bypass graft(s), unspecified, with other forms of angina pectoris: Secondary | ICD-10-CM | POA: Diagnosis not present

## 2019-08-18 DIAGNOSIS — E785 Hyperlipidemia, unspecified: Secondary | ICD-10-CM

## 2019-08-18 DIAGNOSIS — I5032 Chronic diastolic (congestive) heart failure: Secondary | ICD-10-CM

## 2019-08-18 DIAGNOSIS — Z7901 Long term (current) use of anticoagulants: Secondary | ICD-10-CM

## 2019-08-18 DIAGNOSIS — I350 Nonrheumatic aortic (valve) stenosis: Secondary | ICD-10-CM

## 2019-08-18 NOTE — Progress Notes (Signed)
Virtual Visit via Telephone Note   This visit type was conducted due to national recommendations for restrictions regarding the COVID-19 Pandemic (e.g. social distancing) in an effort to limit this patient's exposure and mitigate transmission in our community.  Due to his co-morbid illnesses, this patient is at least at moderate risk for complications without adequate follow up.  This format is felt to be most appropriate for this patient at this time.  The patient did not have access to video technology/had technical difficulties with video requiring transitioning to audio format only (telephone).  All issues noted in this document were discussed and addressed.  No physical exam could be performed with this format.  Please refer to the patient's chart for his  consent to telehealth for [facility].   Date:  08/18/2019   ID:  [name], DOB [birth year], MRN [id number]  Patient Location: Home Provider Location: Office  PCP:  [name], MD  Cardiologist:  No primary care provider on file.  Electrophysiologist:  None   Evaluation Performed:  Follow-Up Visit  Chief Complaint:  CAD  History of Present Illness:    WINFERD COLGIN is a 78 y.o. male with past medical history of CAD (s/p CABG in 2003 with cath in 2017 showing patent LIMA-LAD, patent RIMA-dRCA, and 99% stenosis of SVG-RI treated with BMS, low-risk NST in 10/2018), permanent atrial fibrillation, hypertension, and hyperlipidemia.  He is doing well from a cardiac perspective and denies chest pain, palpitations, leg swelling, dizziness, bleeding problems, and shortness of breath.  Primary complaints relate to right hip pain.  He underwent right hip arthroplasty and revision earlier this year.  The patient does not have symptoms concerning for COVID-19 infection (fever, chills, cough, or new shortness of breath).    Past Medical History:  Diagnosis Date   Atrial flutter [facility])    a. s/p remote ablation by Dr.  [name].   CAD (coronary artery disease)    a. s/p CABG 2003. b. 01/2016: unstable angina - s/p BMS to SVG-ramus intermediate, patent LIMA-mLAD, patent RIMA-dRCA.    CHF (congestive heart failure) ([place]) 11/2010   seen at [facility] then started lasix   Chronic atrial fibrillation ([place])    a. initially post op CABG then chronic. On Coumadin.   Chronic lower back pain    Dysrhythmia 2000   4 or 5 cardioversions.   Essential hypertension    High cholesterol    Ischemic cardiomyopathy    a. Cath 01/2016 -  EF 50-55% with mild distal inferior hypocontractility.   Mild aortic valve stenosis    PER ECHO 10-23-2018 IN EPIC    Unspecified hereditary and idiopathic peripheral neuropathy 04/04/2014   Past Surgical History:  Procedure Laterality Date   BACK SURGERY     CARDIAC CATHETERIZATION  ~ 2000; 2003   CARDIAC CATHETERIZATION N/A 02/01/2016   Procedure: Left Heart Cath and Cors/Grafts Angiography;  Surgeon: [name], MD;  Location: [place] [facility];  Service: Cardiovascular;  Laterality: N/A;   CARDIAC CATHETERIZATION N/A 02/01/2016   Procedure: Coronary Stent Intervention;  Surgeon: [name], MD;  Location: [place] [facility];  Service: Cardiovascular;  Laterality: N/A;   CATARACT EXTRACTION W/PHACO Left 02/09/2019   Procedure: CATARACT EXTRACTION PHACO AND INTRAOCULAR LENS PLACEMENT (IOC);  Surgeon: [name], MD;  Location: [facility];  Service: Ophthalmology;  Laterality: Left;  CDE: 27.70   CATARACT EXTRACTION W/PHACO Right 02/23/2019   Procedure: CATARACT EXTRACTION PHACO AND INTRAOCULAR LENS PLACEMENT RIGHT EYE;  Surgeon: [name], MD;  Location: [facility];  Service: Ophthalmology;  Laterality: Right;  CDE: 10.15   COLONOSCOPY  06/04/2002   Normal colon and rectum   COLONOSCOPY  07/08/2012   Procedure: COLONOSCOPY;  Surgeon: [name], MD;  Location: [facility];  Service: Endoscopy;  Laterality: N/A;  11:30   COLONOSCOPY N/A 10/02/2017   Procedure:  COLONOSCOPY;  Surgeon: [name], MD;  Location: [facility];  Service: Endoscopy;  Laterality: N/A;  10:30am   CORONARY ANGIOPLASTY WITH STENT PLACEMENT  02/01/2016   CORONARY ARTERY BYPASS GRAFT  2003   LIMA to LAD,LIMA to distal RCA,SVG to ramus intermediate vessel.   FEMORAL REVISION Right 03/30/2019   Procedure: Femoral revision right total hip arthroplasty;  Surgeon: [name], MD;  Location: [facility];  Service: Orthopedics;  Laterality: Right;  148min   FOREIGN BODY REMOVAL Right 04/02/2014   Procedure: FOREIGN BODY REMOVAL RIGHT FOOT;  Surgeon: [name], MD;  Location: [facility];  Service: General;  Laterality: Right;   JOINT REPLACEMENT     JOINT REPLACEMENT     LUMBAR [place] SURGERY     REVISION TOTAL HIP ARTHROPLASTY Bilateral 2005-2012   right-left   SHOULDER OPEN ROTATOR CUFF REPAIR Right    TOTAL HIP ARTHROPLASTY Right 1999   TOTAL HIP ARTHROPLASTY Left 2008   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     Current Meds  Medication Sig   aspirin EC 81 MG tablet Take 81 mg by mouth daily.   atorvastatin (LIPITOR) 80 MG tablet Take 1 tablet (80 mg total) by mouth daily.   diltiazem (DILACOR XR) 240 MG 24 hr capsule Take 1 capsule (240 mg total) by mouth daily.   furosemide (LASIX) 40 MG tablet TAKE ONE DAILY. MAY TAKE AN EXTRA TABLET AS NEEDED FOR EDEMA.   metoprolol succinate (TOPROL-XL) 100 MG 24 hr tablet Take 1 tablet (100 mg total) by mouth daily. Take with or immediately following a meal.   nitroGLYCERIN (NITROSTAT) 0.4 MG SL tablet DISSOLVE 1 TABLET UNDER THE TONGUE EVERY 5 MINUTES UP TO 15 MINUTES FOR CHEST PAIN (Patient taking differently: Place 0.4 mg under the tongue every 5 (five) minutes as needed for chest pain. DISSOLVE 1 TABLET UNDER THE TONGUE EVERY 5  MINUTES UP TO 15 MINUTES FOR CHEST PAIN)   warfarin (COUMADIN) 7.5 MG tablet Take 1/2 to 1 tablet daily as directed by coumadin clinic     Allergies:   Augmentin [amoxicillin-pot clavulanate]   Social History   Tobacco Use   Smoking status: Former Smoker    Packs/day: 0.50    Years: 2.00    Pack years: 1.00    Types: Cigarettes    Quit date: 09/03/1966    Years since quitting: 52.9   Smokeless tobacco: Never Used  Substance Use Topics   Alcohol use: Yes    Alcohol/week: 11.0 standard drinks    Types: 7 Glasses of wine, 4 Cans of beer per week    Comment: a glass of wine with dinner/ beer on the weekend   Drug use: No     Family Hx: The patient's family history includes Heart attack in his brother. There is no history of Colon cancer.  ROS:   Please see the history of present illness.     All other systems reviewed and  are negative.   Prior CV studies:   The following studies were reviewed today:  NST: 10/2018  There was no ST segment deviation noted during stress.  The study is normal. There are no perfusion defects  This is a low risk study.  The left ventricular ejection fraction is normal (55-65%).   Echocardiogram: 10/2018 IMPRESSIONS  1. The left ventricle has normal systolic function, with an ejection fraction of 55-60%. The cavity size was normal. There is moderately increased left ventricular wall thickness. Left ventricular diastology could not be evaluated secondary to atrial  fibrillation. 2. The right ventricle has normal systolic function. The cavity was normal. There is no increase in right ventricular wall thickness. 3. Left atrial size was severely dilated. 4. Right atrial size was moderately dilated. 5. The mitral valve is normal in structure. Moderate thickening of the mitral valve leaflet. No evidence of mitral valve stenosis. 6. The tricuspid valve is normal in structure. 7. The aortic valve is tricuspid Moderate thickening of  the aortic valve mild stenosis of the aortic valve. 8. The aortic root is normal in size and structure. 9. Pulmonary hypertension is indeterminant, inadequate TR jet. 10. The inferior vena cava was dilated in size with >50% respiratory variability.  ABI: 04/2019 Summary: Right: Resting right ankle-brachial index indicates noncompressible right lower extremity arteries.The right toe-brachial index is abnormal. ABIs are unreliable.  Left: Resting left ankle-brachial index indicates noncompressible left lower extremity arteries.The left toe-brachial index is abnormal. ABIs are unreliable.  Labs/Other Tests and Data Reviewed:    EKG:  No ECG reviewed.  Recent Labs: 03/16/2019: ALT 33 04/08/2019: B Natriuretic Peptide 113.3 04/09/2019: TSH 1.321 04/12/2019: Magnesium 2.0 04/13/2019: BUN 19; Creatinine, Ser 0.88; Hemoglobin 9.0; Platelets 270; Potassium 4.3; Sodium 138   Recent Lipid Panel Lab Results  Component Value Date/Time   CHOL 126 10/23/2018 12:50 AM   CHOL 187 06/03/2018 07:49 AM   TRIG 53 10/23/2018 12:50 AM   HDL 74 10/23/2018 12:50 AM   HDL 77 06/03/2018 07:49 AM   CHOLHDL 1.7 10/23/2018 12:50 AM   LDLCALC 41 10/23/2018 12:50 AM   LDLCALC 95 06/03/2018 07:49 AM    Wt Readings from Last 3 Encounters:  08/18/19 230 lb (104.3 kg)  07/15/19 241 lb (109.3 kg)  06/03/19 234 lb (106.1 kg)     Objective:    Vital Signs:  BP 122/78    Pulse 78    Ht 5\' 11"  (1.803 m)    Wt 230 lb (104.3 kg)    BMI 32.08 kg/m    VITAL SIGNS:  reviewed  ASSESSMENT & PLAN:    1.  Coronary artery disease: Status post CABG in 2003 with cath in 2017 showing patent LIMA-LAD, patent RIMA-dRCA, and 99% stenosis of SVG-RI treated with BMS. Most recent NST in 10/2018 was low-risk.  Symptomatically stable.  Continue beta-blocker and statin.  Not on aspirin given the need for anticoagulation.  2.  Permanent atrial fibrillation: Continue Cardizem CD and Toprol-XL for rate control.  He is on warfarin for  systemic anticoagulation.  3.  Chronic diastolic heart failure: No signs of hypervolemia.  Continue Lasix 40 mg daily.  He has been instructed to weigh daily and to take an extra dose of Lasix for weight gain greater than 2 to 3 pounds overnight or 5 pounds in 1 week.  4.  Hypertension: Blood pressure is normal.  No changes to therapy.  5.  Hyperlipidemia: LDL at goal.  Full lipid panel reviewed above.  Continue  atorvastatin 80 mg.  6.  Peripheral arterial disease: He saw Dr. Fletcher Anon for the evaluation of his peripheral arterial disease in September 2020 and was felt he had small vessel disease without inflow disease and no claudication symptoms.  7. Aortic stenosis: Mild in severity by echo in February 2020.    COVID-19 Education: The signs and symptoms of COVID-19 were discussed with the patient and how to seek care for testing (follow up with PCP or arrange E-visit).  The importance of social distancing was discussed today.  Time:   Today, I have spent 10 minutes with the patient with telehealth technology discussing the above problems.     Medication Adjustments/Labs and Tests Ordered: Current medicines are reviewed at length with the patient today.  Concerns regarding medicines are outlined above.   Tests Ordered: No orders of the defined types were placed in this encounter.   Medication Changes: No orders of the defined types were placed in this encounter.   Follow Up:  Virtual Visit  in 6 month(s)  Signed, Kate Sable, MD  08/18/2019 9:42 AM    [place]

## 2019-08-18 NOTE — Patient Instructions (Signed)
Medication Instructions:  Your physician recommends that you continue on your current medications as directed. Please refer to the Current Medication list given to you today.  *If you need a refill on your cardiac medications before your next appointment, please call your pharmacy*  Lab Work: None today If you have labs (blood work) drawn today and your tests are completely normal, you will receive your results only by: . MyChart Message (if you have MyChart) OR . A paper copy in the mail If you have any lab test that is abnormal or we need to change your treatment, we will call you to review the results.  Testing/Procedures: None today  Follow-Up: At [facility], you and your health needs are our priority.  As part of our continuing mission to provide you with exceptional heart care, we have created designated Provider Care Teams.  These Care Teams include your primary Cardiologist (physician) and Advanced Practice Providers (APPs -  Physician Assistants and Nurse Practitioners) who all work together to provide you with the care you need, when you need it.  Your next appointment:   6 month(s)  The format for your next appointment:   Virtual Visit   Provider:   Suresh Koneswaran, MD  Other Instructions None     Thank you for choosing [facility] !         

## 2019-08-24 DIAGNOSIS — I482 Chronic atrial fibrillation, unspecified: Secondary | ICD-10-CM | POA: Diagnosis not present

## 2019-08-25 ENCOUNTER — Ambulatory Visit: Payer: Self-pay | Admitting: Pharmacist Clinician (PhC)/ Clinical Pharmacy Specialist

## 2019-08-25 DIAGNOSIS — I482 Chronic atrial fibrillation, unspecified: Secondary | ICD-10-CM

## 2019-08-25 DIAGNOSIS — Z7901 Long term (current) use of anticoagulants: Secondary | ICD-10-CM

## 2019-08-25 LAB — PROTIME-INR
INR: 2 — ABNORMAL HIGH (ref 0.9–1.2)
Prothrombin Time: 20.9 s — ABNORMAL HIGH (ref 9.1–12.0)

## 2019-09-23 DIAGNOSIS — I872 Venous insufficiency (chronic) (peripheral): Secondary | ICD-10-CM | POA: Diagnosis not present

## 2019-10-08 ENCOUNTER — Encounter: Payer: Self-pay | Admitting: Family Medicine

## 2019-10-12 DIAGNOSIS — Z6833 Body mass index (BMI) 33.0-33.9, adult: Secondary | ICD-10-CM | POA: Diagnosis not present

## 2019-10-12 DIAGNOSIS — M5440 Lumbago with sciatica, unspecified side: Secondary | ICD-10-CM | POA: Diagnosis not present

## 2019-10-12 DIAGNOSIS — I10 Essential (primary) hypertension: Secondary | ICD-10-CM | POA: Diagnosis not present

## 2019-10-19 ENCOUNTER — Other Ambulatory Visit: Payer: Self-pay | Admitting: Neurosurgery

## 2019-10-19 ENCOUNTER — Other Ambulatory Visit (HOSPITAL_COMMUNITY): Payer: Self-pay | Admitting: Neurosurgery

## 2019-10-19 DIAGNOSIS — M5440 Lumbago with sciatica, unspecified side: Secondary | ICD-10-CM

## 2019-10-23 ENCOUNTER — Other Ambulatory Visit: Payer: Self-pay

## 2019-10-23 ENCOUNTER — Ambulatory Visit (HOSPITAL_COMMUNITY)
Admission: RE | Admit: 2019-10-23 | Discharge: 2019-10-23 | Disposition: A | Discharge status: Home / Self Care | Payer: Medicare HMO | Source: Ambulatory Visit | Attending: Neurosurgery | Admitting: Neurosurgery

## 2019-10-23 ENCOUNTER — Other Ambulatory Visit (HOSPITAL_COMMUNITY): Payer: Self-pay | Admitting: Neurosurgery

## 2019-10-23 DIAGNOSIS — M5440 Lumbago with sciatica, unspecified side: Secondary | ICD-10-CM | POA: Diagnosis present

## 2019-10-23 DIAGNOSIS — M48061 Spinal stenosis, lumbar region without neurogenic claudication: Secondary | ICD-10-CM | POA: Diagnosis not present

## 2019-10-23 DIAGNOSIS — M5126 Other intervertebral disc displacement, lumbar region: Secondary | ICD-10-CM | POA: Diagnosis not present

## 2019-10-23 LAB — POCT I-STAT CREATININE: Creatinine, Ser: 1.1 mg/dL (ref 0.61–1.24)

## 2019-10-23 MED ORDER — GADOBUTROL 1 MMOL/ML IV SOLN
10.0000 mL | Freq: Once | INTRAVENOUS | Status: AC | PRN
  Administered 2019-10-23: 09:00:00 10 mL via INTRAVENOUS

## 2019-10-30 ENCOUNTER — Other Ambulatory Visit: Payer: Self-pay | Admitting: Cardiovascular Disease

## 2019-10-30 DIAGNOSIS — Z7901 Long term (current) use of anticoagulants: Secondary | ICD-10-CM | POA: Diagnosis not present

## 2019-10-31 LAB — PROTIME-INR
INR: 1.5 — ABNORMAL HIGH (ref 0.9–1.2)
Prothrombin Time: 15.6 s — ABNORMAL HIGH (ref 9.1–12.0)

## 2019-11-02 ENCOUNTER — Ambulatory Visit: Payer: Self-pay | Admitting: Pharmacist

## 2019-11-02 ENCOUNTER — Other Ambulatory Visit: Payer: Self-pay | Admitting: Cardiovascular Disease

## 2019-11-02 DIAGNOSIS — I482 Chronic atrial fibrillation, unspecified: Secondary | ICD-10-CM

## 2019-11-02 DIAGNOSIS — Z7901 Long term (current) use of anticoagulants: Secondary | ICD-10-CM

## 2019-11-04 DIAGNOSIS — E785 Hyperlipidemia, unspecified: Secondary | ICD-10-CM | POA: Diagnosis not present

## 2019-11-04 DIAGNOSIS — G8929 Other chronic pain: Secondary | ICD-10-CM | POA: Diagnosis not present

## 2019-11-04 DIAGNOSIS — I739 Peripheral vascular disease, unspecified: Secondary | ICD-10-CM | POA: Diagnosis not present

## 2019-11-04 DIAGNOSIS — E669 Obesity, unspecified: Secondary | ICD-10-CM | POA: Diagnosis not present

## 2019-11-04 DIAGNOSIS — I509 Heart failure, unspecified: Secondary | ICD-10-CM | POA: Diagnosis not present

## 2019-11-04 DIAGNOSIS — L97901 Non-pressure chronic ulcer of unspecified part of unspecified lower leg limited to breakdown of skin: Secondary | ICD-10-CM | POA: Diagnosis not present

## 2019-11-04 DIAGNOSIS — E261 Secondary hyperaldosteronism: Secondary | ICD-10-CM | POA: Diagnosis not present

## 2019-11-04 DIAGNOSIS — I110 Hypertensive heart disease with heart failure: Secondary | ICD-10-CM | POA: Diagnosis not present

## 2019-11-04 DIAGNOSIS — I4891 Unspecified atrial fibrillation: Secondary | ICD-10-CM | POA: Diagnosis not present

## 2019-11-04 DIAGNOSIS — I25119 Atherosclerotic heart disease of native coronary artery with unspecified angina pectoris: Secondary | ICD-10-CM | POA: Diagnosis not present

## 2019-11-04 DIAGNOSIS — Z008 Encounter for other general examination: Secondary | ICD-10-CM | POA: Diagnosis not present

## 2019-11-05 DIAGNOSIS — I872 Venous insufficiency (chronic) (peripheral): Secondary | ICD-10-CM | POA: Diagnosis not present

## 2019-11-12 ENCOUNTER — Telehealth: Payer: Self-pay

## 2019-11-12 DIAGNOSIS — M4726 Other spondylosis with radiculopathy, lumbar region: Secondary | ICD-10-CM | POA: Diagnosis not present

## 2019-11-12 NOTE — Telephone Encounter (Signed)
Per Rhina Brackett, [facility]. Pt is scheduled to have a procedure on 12-10-19. Need Pt's wafarin held.  Please call Jovane [phone] ext [phone]  Thanks renee

## 2019-11-13 NOTE — Telephone Encounter (Signed)
Left message for Leonard Cisneros with [facility] to please fax over clearance form with complete information needed: Procedure to be done, anesthesia if any, meds to be held, date of surgery or TBD, name of surgeon performing procedure. Please fax clearance form to [phone] attn: Kamren Heintzelman/Pre op. Once I have received clearance form I will be happy to forward to Pre OP Team.

## 2019-11-13 NOTE — Telephone Encounter (Signed)
Preop call back, Please complete our preop request form with all pertinent information. Pharmacy needs to know procedure in order to recommend how long to hold warfarin.

## 2019-11-13 NOTE — Telephone Encounter (Signed)
Know type of procedure to be preformed

## 2019-11-13 NOTE — Telephone Encounter (Signed)
I also left a VM with Jovane requesting the type of procedure to be performed. I asked her to call our office and ask for the preop clinic. Number provided.

## 2019-11-16 NOTE — Telephone Encounter (Signed)
I have left message again today to please call our office [phone] as we are needing information in regards to call stating pt is having surgery. Left message please fax clearance request to [phone] with the following information on the clearance form will need to be: type of procedure, type of anesthesia being used if any, meds to be held if any, name of the surgeon and date of surgery if scheduled. Once clearance information is received I will forward clearance notes to our Pre Op Team for review.

## 2019-11-17 ENCOUNTER — Encounter: Payer: Self-pay | Admitting: Cardiovascular Disease

## 2019-11-17 NOTE — Telephone Encounter (Signed)
   [facility] Pre-operative Risk Assessment    Request for surgical clearance:  1. What type of surgery is being performed?  L2-L3 EPIDERMAL    2. When is this surgery scheduled?  12/10/19   3. What type of clearance is required (medical clearance vs. Pharmacy clearance to hold med vs. Both)?  PHARMACY  4. Are there any medications that need to be held prior to surgery and how long?   COUMADIN X'S 5 DAYS  5. Practice name and name of physician performing surgery?  [facility]    6. What is your office phone number [phone]    7.   What is your office fax number [phone]  8.   Anesthesia type (None, local, MAC, general) ?     Jeanann Lewandowsky 11/17/2019, 11:36 AM  _________________________________________________________________   (provider comments below)

## 2019-11-17 NOTE — Telephone Encounter (Addendum)
Patient with diagnosis of Afib on warfarin for anticoagulation.    Procedure: L2-L3 epidermal Date of procedure: 12/10/2019  CHADS2-VASc score of  5 (CHF, HTN, CAD, AGEx2)  CrCl 68 mL/min (adjBW) Platelet count 270 (04/13/19)  Per office protocol, patient can hold warfarin for 5 days prior to procedure.    Patient will NOT need bridging with Lovenox (enoxaparin) around procedure.  Reviewed by Ramond Dial, Pharm.D, BCPS, CPP [place]  [id number], [place], [place] [address]  Phone: [phone]; Fax: [phone]

## 2019-11-17 NOTE — Telephone Encounter (Signed)
error 

## 2019-11-25 DIAGNOSIS — Z96641 Presence of right artificial hip joint: Secondary | ICD-10-CM | POA: Diagnosis not present

## 2019-11-25 DIAGNOSIS — Z471 Aftercare following joint replacement surgery: Secondary | ICD-10-CM | POA: Diagnosis not present

## 2019-11-26 ENCOUNTER — Other Ambulatory Visit: Payer: Self-pay

## 2019-11-26 DIAGNOSIS — I4891 Unspecified atrial fibrillation: Secondary | ICD-10-CM | POA: Diagnosis not present

## 2019-11-27 ENCOUNTER — Ambulatory Visit (INDEPENDENT_AMBULATORY_CARE_PROVIDER_SITE_OTHER): Payer: Medicare HMO | Admitting: Pharmacist Clinician (PhC)/ Clinical Pharmacy Specialist

## 2019-11-27 DIAGNOSIS — Z7901 Long term (current) use of anticoagulants: Secondary | ICD-10-CM | POA: Diagnosis not present

## 2019-11-27 DIAGNOSIS — I482 Chronic atrial fibrillation, unspecified: Secondary | ICD-10-CM

## 2019-11-27 LAB — PROTIME-INR
INR: 1.9 — ABNORMAL HIGH (ref 0.9–1.2)
Prothrombin Time: 19.9 s — ABNORMAL HIGH (ref 9.1–12.0)

## 2019-12-08 DIAGNOSIS — R69 Illness, unspecified: Secondary | ICD-10-CM | POA: Diagnosis not present

## 2019-12-10 DIAGNOSIS — M4726 Other spondylosis with radiculopathy, lumbar region: Secondary | ICD-10-CM | POA: Diagnosis not present

## 2019-12-10 DIAGNOSIS — M5416 Radiculopathy, lumbar region: Secondary | ICD-10-CM | POA: Diagnosis not present

## 2019-12-15 ENCOUNTER — Telehealth: Payer: Self-pay | Admitting: Cardiovascular Disease

## 2019-12-15 MED ORDER — WARFARIN SODIUM 7.5 MG PO TABS: ORAL_TABLET | ORAL | 1 refills | Status: DC

## 2019-12-15 NOTE — Telephone Encounter (Signed)
Needing refill on warfarin (COUMADIN) 7.5 MG tablet [id number]  [name] [address] [place]   Pt is out and needing it called in

## 2019-12-16 MED ORDER — WARFARIN SODIUM 7.5 MG PO TABS: ORAL_TABLET | ORAL | 1 refills | Status: DC

## 2019-12-16 NOTE — Addendum Note (Signed)
Addended by: Malen Gauze on: 12/16/2019 08:28 AM   Modules accepted: Orders

## 2019-12-25 ENCOUNTER — Other Ambulatory Visit: Payer: Self-pay | Admitting: Student

## 2019-12-29 DIAGNOSIS — R69 Illness, unspecified: Secondary | ICD-10-CM | POA: Diagnosis not present

## 2019-12-30 DIAGNOSIS — M4726 Other spondylosis with radiculopathy, lumbar region: Secondary | ICD-10-CM | POA: Diagnosis not present

## 2019-12-30 DIAGNOSIS — M48061 Spinal stenosis, lumbar region without neurogenic claudication: Secondary | ICD-10-CM | POA: Diagnosis not present

## 2019-12-30 DIAGNOSIS — M5416 Radiculopathy, lumbar region: Secondary | ICD-10-CM | POA: Diagnosis not present

## 2020-01-05 ENCOUNTER — Telehealth: Payer: Self-pay | Admitting: Cardiovascular Disease

## 2020-01-05 MED ORDER — DILTIAZEM HCL ER 240 MG PO CP24: ORAL_CAPSULE | ORAL | 1 refills | Status: DC

## 2020-01-05 NOTE — Telephone Encounter (Signed)
Please let pt know if he's supposed to be taking diltiazem, he is out and is needing a refill- previous Rx is in Dr. Ermalinda Memos name-- [name]   Please call [phone]

## 2020-01-05 NOTE — Telephone Encounter (Signed)
[name]

## 2020-01-13 ENCOUNTER — Telehealth: Payer: Self-pay | Admitting: Pharmacist

## 2020-01-13 DIAGNOSIS — I482 Chronic atrial fibrillation, unspecified: Secondary | ICD-10-CM

## 2020-01-13 NOTE — Telephone Encounter (Signed)
Standing orders entered in computer and faxed to [phone]

## 2020-01-14 ENCOUNTER — Telehealth: Payer: Self-pay | Admitting: Pharmacist

## 2020-01-14 DIAGNOSIS — I482 Chronic atrial fibrillation, unspecified: Secondary | ICD-10-CM

## 2020-01-14 DIAGNOSIS — Z7901 Long term (current) use of anticoagulants: Secondary | ICD-10-CM

## 2020-01-14 LAB — PROTIME-INR

## 2020-01-14 NOTE — Telephone Encounter (Signed)
standing order for INR at [facility] x 6 months

## 2020-01-19 ENCOUNTER — Other Ambulatory Visit: Payer: Self-pay | Admitting: Internal Medicine

## 2020-01-19 DIAGNOSIS — I482 Chronic atrial fibrillation, unspecified: Secondary | ICD-10-CM | POA: Diagnosis not present

## 2020-01-20 DIAGNOSIS — R69 Illness, unspecified: Secondary | ICD-10-CM | POA: Diagnosis not present

## 2020-01-20 LAB — PROTIME-INR
INR: 1.6 — ABNORMAL HIGH (ref 0.9–1.2)
Prothrombin Time: 17 s — ABNORMAL HIGH (ref 9.1–12.0)

## 2020-01-26 ENCOUNTER — Telehealth: Payer: Self-pay | Admitting: Cardiovascular Disease

## 2020-01-27 NOTE — Telephone Encounter (Signed)
Will forward to pharmacist that ordered test

## 2020-01-27 NOTE — Telephone Encounter (Signed)
INR is from 8 days ago. Will route to Raquel for follow up tomorrow as new INR will need to be drawn for dosing. INRs drawn at [facility] do not always route to ordering provider - will need to keep track of [facility] INR patients for sooner follow up with results.

## 2020-01-28 ENCOUNTER — Ambulatory Visit (INDEPENDENT_AMBULATORY_CARE_PROVIDER_SITE_OTHER): Payer: Medicare HMO | Admitting: Pharmacist

## 2020-01-28 DIAGNOSIS — I482 Chronic atrial fibrillation, unspecified: Secondary | ICD-10-CM

## 2020-01-28 DIAGNOSIS — Z7901 Long term (current) use of anticoagulants: Secondary | ICD-10-CM

## 2020-01-28 NOTE — Telephone Encounter (Signed)
See coumadin clinic note for details. Original order for 5/13 was cancelled after problems with sample

## 2020-02-11 ENCOUNTER — Other Ambulatory Visit: Payer: Self-pay

## 2020-02-11 ENCOUNTER — Ambulatory Visit (INDEPENDENT_AMBULATORY_CARE_PROVIDER_SITE_OTHER): Payer: Medicare HMO | Admitting: Pharmacist

## 2020-02-11 DIAGNOSIS — Z7901 Long term (current) use of anticoagulants: Secondary | ICD-10-CM | POA: Diagnosis not present

## 2020-02-11 DIAGNOSIS — I482 Chronic atrial fibrillation, unspecified: Secondary | ICD-10-CM | POA: Diagnosis not present

## 2020-02-11 LAB — POCT INR: INR: 2 (ref 2.0–3.0)

## 2020-02-11 NOTE — Patient Instructions (Signed)
Description   Continue taking 1 tablet daily excpet for 1/2 tablet every Monday.  Recheck INR in 4 weeks.

## 2020-02-17 ENCOUNTER — Other Ambulatory Visit: Payer: Self-pay

## 2020-02-17 ENCOUNTER — Encounter: Payer: Self-pay | Admitting: Family Medicine

## 2020-02-17 ENCOUNTER — Ambulatory Visit (INDEPENDENT_AMBULATORY_CARE_PROVIDER_SITE_OTHER): Payer: Medicare HMO | Admitting: Family Medicine

## 2020-02-17 VITALS — BP 130/70 | HR 62 | Ht 70.0 in | Wt 227.8 lb

## 2020-02-17 DIAGNOSIS — E785 Hyperlipidemia, unspecified: Secondary | ICD-10-CM

## 2020-02-17 DIAGNOSIS — I5032 Chronic diastolic (congestive) heart failure: Secondary | ICD-10-CM | POA: Diagnosis not present

## 2020-02-17 DIAGNOSIS — I350 Nonrheumatic aortic (valve) stenosis: Secondary | ICD-10-CM | POA: Diagnosis not present

## 2020-02-17 DIAGNOSIS — I4821 Permanent atrial fibrillation: Secondary | ICD-10-CM | POA: Diagnosis not present

## 2020-02-17 DIAGNOSIS — I2510 Atherosclerotic heart disease of native coronary artery without angina pectoris: Secondary | ICD-10-CM

## 2020-02-17 DIAGNOSIS — I10 Essential (primary) hypertension: Secondary | ICD-10-CM

## 2020-02-17 NOTE — Progress Notes (Signed)
Cardiology Office Note  Date: 02/17/2020   ID: Leonard Cisneros, DOB Dec 04, 1940, MRN [id number]  PCP:  [name], MD  Cardiologist:  No primary care provider on file. Electrophysiologist:  None   Chief Complaint: Follow-up CAD, chronic diastolic heart failure, permanent atrial fibrillation, HTN, HLD, PAD, aortic stenosis  History of Present Illness: Leonard Cisneros is a 79 y.o. male with a history of  CAD, chronic diastolic heart failure, permanent atrial fibrillation, HTN, HLD, PAD, aortic stenosis  Status post CABG 2003 with cath in 2017: Patent LIMA-LAD, patent RIMA-distal RCA, 99% stenosis of SVG-RI treated with BMS.  Recent stress test 10/23/2018 low risk.  Last encounter via telemedicine 08/18/2019 with Dr. Bronson Ing.  [name] CAD was symptomatically stable.  He was continuing beta-blocker and statin.  Not on aspirin given need for anticoagulation. Chronic diastolic heart failure with symptomatically stable with no signs of hypervolemia.  He was instructed on weighing daily and take an extra dose of Lasix for weight gain greater than 3 pounds overnight or 5 pounds in 1 week.  Blood pressure was normal and there were no changes to therapy.  LDL was at goal continue atorvastatin 80 mg.  Aortic stenosis mild in severity by echo February 2020.  Saw Dr.Arida for evaluation of PAD September 2020 and felt he had small vessel disease without inflow disease and no claudication symptoms.  Here for follow-up.  Denies any recent acute illnesses, hospitalizations.  Denies any progressive anginal or exertional symptoms, sensation of palpitations or arrhythmias, orthostatic symptoms, bleeding, PND, orthopnea, lower extremity edema.  Does have some mild lower extremity pain when performing activities.  Had a previous study for PAD in September 2020 which apparently showed small vessel disease.  States he had recent lab work with Dr. Wolfgang Phoenix and everything was normal.  Past Medical History:   Diagnosis Date  . Atrial flutter ([place])    a. s/p remote ablation by Dr. [name].  [name] CAD (coronary artery disease)    a. s/p CABG 2003. b. 01/2016: unstable angina - s/p BMS to SVG-ramus intermediate, patent LIMA-mLAD, patent RIMA-dRCA.   [name] CHF (congestive heart failure) ([place]) 11/2010   seen at [facility] then started lasix  . Chronic atrial fibrillation (HCC)    a. initially post op CABG then chronic. On Coumadin.  . Chronic lower back pain   . Dysrhythmia 2000   4 or 5 cardioversions.  . Essential hypertension   . High cholesterol   . Ischemic cardiomyopathy    a. Cath 01/2016 -  EF 50-55% with mild distal inferior hypocontractility.  . Mild aortic valve stenosis    PER ECHO 10-23-2018 IN [facility]   . Unspecified hereditary and idiopathic peripheral neuropathy 04/04/2014    Past Surgical History:  Procedure Laterality Date  . BACK SURGERY    . CARDIAC CATHETERIZATION  ~ 2000; 2003  . CARDIAC CATHETERIZATION N/A 02/01/2016   Procedure: Left Heart Cath and Cors/Grafts Angiography;  Surgeon: [name], MD;  Location: [facility];  Service: Cardiovascular;  Laterality: N/A;  . CARDIAC CATHETERIZATION N/A 02/01/2016   Procedure: Coronary Stent Intervention;  Surgeon: [name], MD;  Location: [facility];  Service: Cardiovascular;  Laterality: N/A;  . CATARACT EXTRACTION W/PHACO Left 02/09/2019   Procedure: CATARACT EXTRACTION PHACO AND INTRAOCULAR LENS PLACEMENT (IOC);  Surgeon: [name], MD;  Location: [facility];  Service: Ophthalmology;  Laterality: Left;  CDE: 27.70  . CATARACT EXTRACTION W/PHACO Right 02/23/2019   Procedure: CATARACT  EXTRACTION PHACO AND INTRAOCULAR LENS PLACEMENT RIGHT EYE;  Surgeon: [name], MD;  Location: [facility];  Service: Ophthalmology;  Laterality: Right;  CDE: 10.15  . COLONOSCOPY  06/04/2002   Normal colon and rectum  . COLONOSCOPY  07/08/2012   Procedure: COLONOSCOPY;  Surgeon: [name], MD;  Location: [facility];  Service: Endoscopy;   Laterality: N/A;  11:30  . COLONOSCOPY N/A 10/02/2017   Procedure: COLONOSCOPY;  Surgeon: [name], MD;  Location: [facility];  Service: Endoscopy;  Laterality: N/A;  10:30am  . CORONARY ANGIOPLASTY WITH STENT PLACEMENT  02/01/2016  . CORONARY ARTERY BYPASS GRAFT  2003   LIMA to LAD,LIMA to distal RCA,SVG to ramus intermediate vessel.  . FEMORAL REVISION Right 03/30/2019   Procedure: Femoral revision right total hip arthroplasty;  Surgeon: [name], MD;  Location: [facility];  Service: Orthopedics;  Laterality: Right;  149min  . FOREIGN BODY REMOVAL Right 04/02/2014   Procedure: FOREIGN BODY REMOVAL RIGHT FOOT;  Surgeon: [name], MD;  Location: [facility];  Service: General;  Laterality: Right;  . JOINT REPLACEMENT    . JOINT REPLACEMENT    . LUMBAR DISC SURGERY    . REVISION TOTAL HIP ARTHROPLASTY Bilateral 2005-2012   right-left  . SHOULDER OPEN ROTATOR CUFF REPAIR Right   . TOTAL HIP ARTHROPLASTY Right 1999  . TOTAL HIP ARTHROPLASTY Left 2008  . 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    Current Outpatient Medications  Medication Sig Dispense Refill  . aspirin EC 81 MG tablet Take 81 mg by mouth daily.    [name] atorvastatin (LIPITOR) 80 MG tablet Take 1 tablet (80 mg total) by mouth daily. 90 tablet 2  . diltiazem (DILT-XR) 240 MG 24 hr capsule TAKE 1 CAPSULE BY MOUTH EVERY DAY 90 capsule 1  . furosemide (LASIX) 40 MG tablet TAKE ONE DAILY. MAY TAKE AN EXTRA TABLET AS NEEDED FOR EDEMA. 180 tablet 1  . metoprolol succinate (TOPROL-XL) 100 MG 24 hr tablet Take 1 tablet (100 mg total) by mouth daily. Take with or immediately following a meal. 90 tablet 3  . nitroGLYCERIN (NITROSTAT) 0.4 MG SL tablet DISSOLVE 1 TABLET UNDER THE TONGUE EVERY 5 MINUTES UP TO 15 MINUTES FOR CHEST PAIN (Patient  taking differently: Place 0.4 mg under the tongue every 5 (five) minutes as needed for chest pain. DISSOLVE 1 TABLET UNDER THE TONGUE EVERY 5 MINUTES UP TO 15 MINUTES FOR CHEST PAIN) 25 tablet 0  . warfarin (COUMADIN) 7.5 MG tablet Take 1/2 to 1 tablet daily as directed by coumadin clinic 90 tablet 1   No current facility-administered medications for this visit.   Allergies:  Augmentin [amoxicillin-pot clavulanate]   Social History: The patient  reports that he quit smoking about 53 years ago. His smoking use included cigarettes. He has a 1.00 pack-year smoking history. He has never used smokeless tobacco. He reports current alcohol use of about 11.0 standard drinks of alcohol per week. He reports that he does not use drugs.   Family History: The patient's family history includes Heart attack in his brother.   ROS:  Please see the history of present illness. Otherwise, complete review of systems is positive for none.  All other systems are reviewed and negative.   Physical Exam: VS:  BP 130/70   Pulse 62  Ht 5\' 10"  (1.778 m)   Wt 227 lb 12.8 oz (103.3 kg)   SpO2 98%   BMI 32.69 kg/m , BMI Body mass index is 32.69 kg/m.  Wt Readings from Last 3 Encounters:  02/17/20 227 lb 12.8 oz (103.3 kg)  08/18/19 230 lb (104.3 kg)  07/15/19 241 lb (109.3 kg)    General: Patient appears comfortable at rest. Neck: Supple, no elevated JVP or carotid bruits, no thyromegaly. Lungs: Clear to auscultation, nonlabored breathing at rest. Cardiac: Regular rate and rhythm, no S3 mild systolic murmur heard throughout school patient points  2/6, no pericardial rub. Extremities: No pitting edema, distal pulses 2+. Skin: Warm and dry. Musculoskeletal: No kyphosis. Neuropsychiatric: Alert and oriented x3, affect grossly appropriate.  ECG:  EKG April 08, 2019 showed atrial fibrillation with rate of 111, right axis deviation, low voltage, extremity in precordial leads.  Recent Labwork: 03/16/2019: ALT 33;  AST 29 04/08/2019: B Natriuretic Peptide 113.3 04/09/2019: TSH 1.321 04/12/2019: Magnesium 2.0 04/13/2019: BUN 19; Hemoglobin 9.0; Platelets 270; Potassium 4.3; Sodium 138 10/23/2019: Creatinine, Ser 1.10     Component Value Date/Time   CHOL 126 10/23/2018 0050   CHOL 187 06/03/2018 0749   TRIG 53 10/23/2018 0050   HDL 74 10/23/2018 0050   HDL 77 06/03/2018 0749   CHOLHDL 1.7 10/23/2018 0050   VLDL 11 10/23/2018 0050   LDLCALC 41 10/23/2018 0050   LDLCALC 95 06/03/2018 0749    Other Studies Reviewed Today:  NST: 10/2018  There was no ST segment deviation noted during stress.  The study is normal. There are no perfusion defects  This is a low risk study.  The left ventricular ejection fraction is normal (55-65%).   Echocardiogram: 10/2018 IMPRESSIONS  1. The left ventricle has normal systolic function, with an ejection fraction of 55-60%. The cavity size was normal. There is moderately increased left ventricular wall thickness. Left ventricular diastology could not be evaluated secondary to atrial  fibrillation. 2. The right ventricle has normal systolic function. The cavity was normal. There is no increase in right ventricular wall thickness. 3. Left atrial size was severely dilated. 4. Right atrial size was moderately dilated. 5. The mitral valve is normal in structure. Moderate thickening of the mitral valve leaflet. No evidence of mitral valve stenosis. 6. The tricuspid valve is normal in structure. 7. The aortic valve is tricuspid Moderate thickening of the aortic valve mild stenosis of the aortic valve. 8. The aortic root is normal in size and structure. 9. Pulmonary hypertension is indeterminant, inadequate TR jet. 10. The inferior vena cava was dilated in size with >50% respiratory variability.  ABI: 04/2019 Summary: Right: Resting right ankle-brachial index indicates noncompressible right lower extremity arteries.The right toe-brachial index is abnormal.  ABIs are unreliable.  Left: Resting left ankle-brachial index indicates noncompressible left lower extremity arteries.The left toe-brachial index is abnormal. ABIs are unreliable.  Assessment and Plan:  1. CAD in native artery   2. Permanent atrial fibrillation ([place])   3. Chronic diastolic heart failure ([place])   4. Essential hypertension   5. Hyperlipidemia LDL goal <70   6. Nonrheumatic aortic valve stenosis    1. CAD in native artery S/P CABG 2003. Cath 2017: Patent LIMA-LAD, patent RIMA-RCA, 99% stenosis of SVG RI treated with BMS.  Recent stress test low risk, symptomatically stable.  Continue aspirin 81 mg.  Sublingual nitroglycerin as needed for chest pain.  Denies any recent anginal or exertional symptoms.  2. Permanent atrial fibrillation (HCC) On Cardizem  CD and Toprol for rate control.  On Coumadin for anticoagulation.  Continue diltiazem 240 daily.  Continue aspirin 81 mg.  Continue warfarin 7.5 mg 1/2 to 1 tablet daily as directed by Coumadin clinic.  3. Chronic diastolic heart failure (HCC) On Lasix 40 mg daily.  Weight has been controlled.  He is down 3 pounds from December from 230 pounds to 227 pounds.  Continue Lasix 40 mg daily.  And as needed for lower extremity edema.  Continue Toprol-XL 100 mg daily.  4. Essential hypertension He is normotensive today at 130/70.  Heart rate of 62.  Toprol-XL 100 mg daily.  5. Hyperlipidemia LDL goal <70 Last LDL at goal.  Continue statin medication atorvastatin 80 mg daily.  Patient states he had recent lab work at his primary care office.  We will attempt to obtain those results.  6. Nonrheumatic aortic valve stenosis Mild on last echo 10/23/2018.  Mild systolic murmur heard throughout all auscultation points today.  Repeat echocardiogram to reassess assess mitral regurgitation and aortic stenosis.  Medication Adjustments/Labs and Tests Ordered: Current medicines are reviewed at length with the patient today.  Concerns regarding  medicines are outlined above.   Disposition: Follow-up with Dr. Bronson Ing or APP 6 months  Signed, Levell July, NP 02/17/2020 9:50 AM    [facility] [place], [place], [place] [address] Phone: [phone]; Fax: [phone]

## 2020-02-17 NOTE — Patient Instructions (Signed)
Medication Instructions:    Your physician recommends that you continue on your current medications as directed. Please refer to the Current Medication list given to you today.  Labwork:  NONE  Testing/Procedures: Your physician has requested that you have an echocardiogram. Echocardiography is a painless test that uses sound waves to create images of your heart. It provides your doctor with information about the size and shape of your heart and how well your heart's chambers and valves are working. This procedure takes approximately one hour. There are no restrictions for this procedure.  Follow-Up:  Your physician recommends that you schedule a follow-up appointment in: 6 months (office).  Any Other Special Instructions Will Be Listed Below (If Applicable).  If you need a refill on your cardiac medications before your next appointment, please call your pharmacy. 

## 2020-02-23 ENCOUNTER — Telehealth: Payer: Medicare HMO | Admitting: Cardiovascular Disease

## 2020-02-24 DIAGNOSIS — R69 Illness, unspecified: Secondary | ICD-10-CM | POA: Diagnosis not present

## 2020-03-01 DIAGNOSIS — M5416 Radiculopathy, lumbar region: Secondary | ICD-10-CM | POA: Diagnosis not present

## 2020-03-10 ENCOUNTER — Ambulatory Visit (INDEPENDENT_AMBULATORY_CARE_PROVIDER_SITE_OTHER): Payer: Medicare HMO | Admitting: *Deleted

## 2020-03-10 ENCOUNTER — Other Ambulatory Visit: Payer: Self-pay

## 2020-03-10 DIAGNOSIS — Z5181 Encounter for therapeutic drug level monitoring: Secondary | ICD-10-CM

## 2020-03-10 DIAGNOSIS — I482 Chronic atrial fibrillation, unspecified: Secondary | ICD-10-CM | POA: Diagnosis not present

## 2020-03-10 DIAGNOSIS — I4891 Unspecified atrial fibrillation: Secondary | ICD-10-CM | POA: Diagnosis not present

## 2020-03-10 DIAGNOSIS — Z7901 Long term (current) use of anticoagulants: Secondary | ICD-10-CM

## 2020-03-10 LAB — POCT INR
INR: 1.4 — AB (ref 2.0–3.0)
INR: 1.4 — AB (ref 2.0–3.0)

## 2020-03-10 NOTE — Patient Instructions (Signed)
Take warfarin 1 1/2 tablets tonight and tomorrow night then resume 1 tablet daily except for 1/2 tablet every Monday.  Recheck INR in 3 weeks.

## 2020-03-22 ENCOUNTER — Ambulatory Visit (INDEPENDENT_AMBULATORY_CARE_PROVIDER_SITE_OTHER): Payer: Medicare HMO

## 2020-03-22 DIAGNOSIS — I350 Nonrheumatic aortic (valve) stenosis: Secondary | ICD-10-CM

## 2020-03-22 DIAGNOSIS — I5032 Chronic diastolic (congestive) heart failure: Secondary | ICD-10-CM | POA: Diagnosis not present

## 2020-03-22 LAB — ECHOCARDIOGRAM COMPLETE
AR max vel: 1.29 cm2
AV Area VTI: 1.49 cm2
AV Area mean vel: 1.38 cm2
AV Mean grad: 15.5 mmHg
AV Peak grad: 28.9 mmHg
Ao pk vel: 2.69 m/s
Area-P 1/2: 3.83 cm2
Calc EF: 47.4 %
MV M vel: 4.14 m/s
MV Peak grad: 68.6 mmHg
S' Lateral: 3.09 cm
Single Plane A2C EF: 50.4 %
Single Plane A4C EF: 49.4 %

## 2020-03-23 ENCOUNTER — Telehealth: Payer: Self-pay | Admitting: *Deleted

## 2020-03-23 NOTE — Telephone Encounter (Signed)
-----   Message from Verta Ellen., NP sent at 03/22/2020  4:23 PM EDT ----- Please call the patient and let him know his echocardiogram showed his pumping function had decreased mildly from the previous echo last year.  His pumping function recently was 50 to 55% which is on the low end of normal.  Last year it was normal in the range of 55 to 60%.  He has some abnormalities in the wall movement his left ventricle.  His mitral valve is mild to moderately leaky.  He has moderate aortic stenosis when compared to last echocardiogram in 2020 where he had some mild stenosis.  Thank you

## 2020-03-23 NOTE — Telephone Encounter (Signed)
Patient informed. Copy sent to PCP °

## 2020-03-31 ENCOUNTER — Ambulatory Visit (INDEPENDENT_AMBULATORY_CARE_PROVIDER_SITE_OTHER): Payer: Medicare HMO | Admitting: *Deleted

## 2020-03-31 DIAGNOSIS — I482 Chronic atrial fibrillation, unspecified: Secondary | ICD-10-CM | POA: Diagnosis not present

## 2020-03-31 DIAGNOSIS — Z7901 Long term (current) use of anticoagulants: Secondary | ICD-10-CM | POA: Diagnosis not present

## 2020-03-31 DIAGNOSIS — Z5181 Encounter for therapeutic drug level monitoring: Secondary | ICD-10-CM

## 2020-03-31 LAB — POCT INR: INR: 1.6 — AB (ref 2.0–3.0)

## 2020-03-31 NOTE — Patient Instructions (Signed)
Increase warfarin to 1 tablet daily except 1 1/2 tablets on Mondays and Thursdays Recheck INR in 2.5 weeks.

## 2020-04-07 ENCOUNTER — Emergency Department (HOSPITAL_COMMUNITY)
Admission: EM | Admit: 2020-04-07 | Discharge: 2020-04-07 | Disposition: A | Discharge status: Home / Self Care | Payer: Medicare HMO | Attending: Emergency Medicine | Admitting: Emergency Medicine

## 2020-04-07 ENCOUNTER — Other Ambulatory Visit: Payer: Self-pay

## 2020-04-07 ENCOUNTER — Emergency Department (HOSPITAL_COMMUNITY): Payer: Medicare HMO

## 2020-04-07 ENCOUNTER — Encounter (HOSPITAL_COMMUNITY): Payer: Self-pay | Admitting: Emergency Medicine

## 2020-04-07 DIAGNOSIS — Z951 Presence of aortocoronary bypass graft: Secondary | ICD-10-CM | POA: Diagnosis not present

## 2020-04-07 DIAGNOSIS — I5033 Acute on chronic diastolic (congestive) heart failure: Secondary | ICD-10-CM | POA: Diagnosis not present

## 2020-04-07 DIAGNOSIS — Z7982 Long term (current) use of aspirin: Secondary | ICD-10-CM | POA: Diagnosis not present

## 2020-04-07 DIAGNOSIS — Z87891 Personal history of nicotine dependence: Secondary | ICD-10-CM | POA: Insufficient documentation

## 2020-04-07 DIAGNOSIS — I4891 Unspecified atrial fibrillation: Secondary | ICD-10-CM | POA: Diagnosis not present

## 2020-04-07 DIAGNOSIS — Z7901 Long term (current) use of anticoagulants: Secondary | ICD-10-CM | POA: Diagnosis not present

## 2020-04-07 DIAGNOSIS — Z79899 Other long term (current) drug therapy: Secondary | ICD-10-CM | POA: Insufficient documentation

## 2020-04-07 DIAGNOSIS — R079 Chest pain, unspecified: Secondary | ICD-10-CM | POA: Diagnosis not present

## 2020-04-07 DIAGNOSIS — I10 Essential (primary) hypertension: Secondary | ICD-10-CM | POA: Diagnosis not present

## 2020-04-07 DIAGNOSIS — I110 Hypertensive heart disease with heart failure: Secondary | ICD-10-CM | POA: Diagnosis not present

## 2020-04-07 DIAGNOSIS — R000 Tachycardia, unspecified: Secondary | ICD-10-CM | POA: Diagnosis not present

## 2020-04-07 DIAGNOSIS — I2510 Atherosclerotic heart disease of native coronary artery without angina pectoris: Secondary | ICD-10-CM | POA: Insufficient documentation

## 2020-04-07 DIAGNOSIS — R0789 Other chest pain: Secondary | ICD-10-CM | POA: Diagnosis not present

## 2020-04-07 DIAGNOSIS — I517 Cardiomegaly: Secondary | ICD-10-CM | POA: Diagnosis not present

## 2020-04-07 LAB — BASIC METABOLIC PANEL
Anion gap: 12 (ref 5–15)
BUN: 13 mg/dL (ref 8–23)
CO2: 26 mmol/L (ref 22–32)
Calcium: 9 mg/dL (ref 8.9–10.3)
Chloride: 102 mmol/L (ref 98–111)
Creatinine, Ser: 0.73 mg/dL (ref 0.61–1.24)
GFR calc Af Amer: >60 mL/min (ref >60)
GFR calc non Af Amer: >60 mL/min (ref >60)
Glucose, Bld: 115 mg/dL — ABNORMAL HIGH (ref 70–99)
Potassium: 3.7 mmol/L (ref 3.5–5.1)
Sodium: 140 mmol/L (ref 135–145)

## 2020-04-07 LAB — CBC
HCT: 35.9 % — ABNORMAL LOW (ref 39.0–52.0)
Hemoglobin: 12.1 g/dL — ABNORMAL LOW (ref 13.0–17.0)
MCH: 36.9 pg — ABNORMAL HIGH (ref 26.0–34.0)
MCHC: 33.7 g/dL (ref 30.0–36.0)
MCV: 109.5 fL — ABNORMAL HIGH (ref 80.0–100.0)
Platelets: 207 10*3/uL (ref 150–400)
RBC: 3.28 MIL/uL — ABNORMAL LOW (ref 4.22–5.81)
RDW: 14.4 % (ref 11.5–15.5)
WBC: 6.9 10*3/uL (ref 4.0–10.5)
nRBC: 0 % (ref 0.0–0.2)

## 2020-04-07 LAB — PROTIME-INR
INR: 2.5 — ABNORMAL HIGH (ref 0.8–1.2)
Prothrombin Time: 26.4 seconds — ABNORMAL HIGH (ref 11.4–15.2)

## 2020-04-07 LAB — TROPONIN I (HIGH SENSITIVITY)
Troponin I (High Sensitivity): 10 ng/L (ref <18)
Troponin I (High Sensitivity): 9 ng/L (ref <18)

## 2020-04-07 NOTE — ED Triage Notes (Addendum)
Pt c/o left chest pain since Tues; pt reports hx of A-fib with cardioversion needed x 1 week ago; CBG en route 119, v/s WNL during triage

## 2020-04-07 NOTE — ED Notes (Signed)
Varney Biles from Cardiology called with appointment for follow-up with Dr. Levell July in [facility] on August 19th at 3:30 pm at [place], [address] across from [employer].  Copy of Appt given to Pt.

## 2020-04-07 NOTE — Discharge Instructions (Addendum)
Return to the emergency room for any new or worsening symptoms. Follow-up with cardiology, call today to schedule appointment.

## 2020-04-07 NOTE — ED Provider Notes (Signed)
[facility] EMERGENCY DEPARTMENT Provider Note   CSN: [id number] Arrival date & time: 04/07/20  1104     History Chief Complaint  Patient presents with  . Chest Pain    Leonard Cisneros is a 79 y.o. male.  79 year old male with past medical history of chronic a. Fib (subtherapeutic on coumadin- reports last INR to be 1.6), CAD/CABG, PAD, HTN, CHF (EF 55-60% on 10/2018 echo), hyperlipidemia, as well as history listed below, brought in by EMS for left side chest discomfort.  Patient states that he had an episode of left-sided chest discomfort yesterday which was brief, return to this morning at 3 AM, waking him from his sleep and was constant until about 11 AM today.  Patient has a difficult time describing the discomfort in his chest but states he is never felt anything like this before, states that he felt disoriented, dizzy, nauseous, short of breath and diaphoretic.  Symptoms resolved upon arrival in the emergency room and have not returned since.  Patient reports compliance with his Coumadin and diltiazem as well as other medications which he takes daily. No other complaints or concerns at this time.         Past Medical History:  Diagnosis Date  . Atrial flutter ([place])    a. s/p remote ablation by Dr. [name].  [name] CAD (coronary artery disease)    a. s/p CABG 2003. b. 01/2016: unstable angina - s/p BMS to SVG-ramus intermediate, patent LIMA-mLAD, patent RIMA-dRCA.   [name] CHF (congestive heart failure) ([place]) 11/2010   seen at [facility] then started lasix  . Chronic atrial fibrillation (HCC)    a. initially post op CABG then chronic. On Coumadin.  . Chronic lower back pain   . Dysrhythmia 2000   4 or 5 cardioversions.  . Essential hypertension   . High cholesterol   . Ischemic cardiomyopathy    a. Cath 01/2016 -  EF 50-55% with mild distal inferior hypocontractility.  . Mild aortic valve stenosis    PER ECHO 10-23-2018 IN [facility]   . Unspecified hereditary and idiopathic peripheral neuropathy  04/04/2014    Patient Active Problem List   Diagnosis Date Noted  . Afib ([place]) 04/09/2019  . Atrial fibrillation with RVR ([place]) 04/08/2019  . Acute on chronic diastolic CHF (congestive heart failure) ([place])   . Macrocytic anemia   . 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 Chest pain 10/22/2018  . Physical deconditioning 04/26/2018  . Gait instability 04/26/2018  . Closed fracture of greater trochanter of right femur with routine healing 04/20/18 04/25/2018  . Leg pain 04/24/2018  . Fall 04/21/2018  . PAD (peripheral artery disease) ([place]) 04/21/2018  . Hx of adenomatous colonic polyps 08/20/2017  . Ischemic cardiomyopathy 02/02/2016  . Obesity 02/02/2016  . CAD S/P percutaneous coronary angioplasty 02/01/2016  . Exertional angina ([place]) 01/28/2016  . Hyperlipidemia LDL goal <70 01/28/2016  . Neuropathic pain 06/13/2015  . Mild obesity 04/20/2015  . Essential hypertension 04/20/2015  . Unspecified hereditary and idiopathic peripheral neuropathy 04/04/2014  . Foreign body (FB) in soft tissue 03/31/2014  . Impaired glucose metabolism 03/31/2014  . Obesity (BMI 30-39.9) 03/31/2014  . Hx of CABG 01/04/2014  . Rotator cuff tear, left 10/22/2013  . Rotator cuff syndrome of left shoulder 10/22/2013  . Chronic atrial fibrillation ([place]) 06/18/2013  . Long term (current) use of anticoagulants 06/18/2013  . Screening for colon cancer 06/18/2012  . High risk medication use 06/18/2012    Past Surgical History:  Procedure  Laterality Date  . BACK SURGERY    . CARDIAC CATHETERIZATION  ~ 2000; 2003  . CARDIAC CATHETERIZATION N/A 02/01/2016   Procedure: Left Heart Cath and Cors/Grafts Angiography;  Surgeon: [name], MD;  Location: [facility];  Service: Cardiovascular;  Laterality: N/A;  . CARDIAC CATHETERIZATION N/A 02/01/2016   Procedure: Coronary Stent Intervention;  Surgeon: [name], MD;  Location: [facility];  Service: Cardiovascular;  Laterality: N/A;    . CATARACT EXTRACTION W/PHACO Left 02/09/2019   Procedure: CATARACT EXTRACTION PHACO AND INTRAOCULAR LENS PLACEMENT (IOC);  Surgeon: [name], MD;  Location: [facility];  Service: Ophthalmology;  Laterality: Left;  CDE: 27.70  . CATARACT EXTRACTION W/PHACO Right 02/23/2019   Procedure: CATARACT EXTRACTION PHACO AND INTRAOCULAR LENS PLACEMENT RIGHT EYE;  Surgeon: [name], MD;  Location: [facility];  Service: Ophthalmology;  Laterality: Right;  CDE: 10.15  . COLONOSCOPY  06/04/2002   Normal colon and rectum  . COLONOSCOPY  07/08/2012   Procedure: COLONOSCOPY;  Surgeon: [name], MD;  Location: [facility];  Service: Endoscopy;  Laterality: N/A;  11:30  . COLONOSCOPY N/A 10/02/2017   Procedure: COLONOSCOPY;  Surgeon: [name], MD;  Location: [facility];  Service: Endoscopy;  Laterality: N/A;  10:30am  . CORONARY ANGIOPLASTY WITH STENT PLACEMENT  02/01/2016  . CORONARY ARTERY BYPASS GRAFT  2003   LIMA to LAD,LIMA to distal RCA,SVG to ramus intermediate vessel.  . FEMORAL REVISION Right 03/30/2019   Procedure: Femoral revision right total hip arthroplasty;  Surgeon: [name], MD;  Location: [facility];  Service: Orthopedics;  Laterality: Right;  130min  . FOREIGN BODY REMOVAL Right 04/02/2014   Procedure: FOREIGN BODY REMOVAL RIGHT FOOT;  Surgeon: [name], MD;  Location: [facility];  Service: General;  Laterality: Right;  . JOINT REPLACEMENT    . JOINT REPLACEMENT    . LUMBAR DISC SURGERY    . REVISION TOTAL HIP ARTHROPLASTY Bilateral 2005-2012   right-left  . SHOULDER OPEN ROTATOR CUFF REPAIR Right   . TOTAL HIP ARTHROPLASTY Right 1999  . TOTAL HIP ARTHROPLASTY Left 2008  . 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       Family History  Problem Relation Age of Onset  . Heart attack  Brother   . Colon cancer Neg Hx     Social History   Tobacco Use  . Smoking status: Former Smoker    Packs/day: 0.50    Years: 2.00    Pack years: 1.00    Types: Cigarettes    Quit date: 09/03/1966    Years since quitting: 53.6  . Smokeless tobacco: Never Used  Vaping Use  . Vaping Use: Never used  Substance Use Topics  . Alcohol use: Yes    Alcohol/week: 11.0 standard drinks    Types: 7 Glasses of wine, 4 Cans of beer per week    Comment: a glass of wine with dinner/ beer on the weekend  . Drug use: No    Home Medications Prior to Admission medications   Medication Sig Start Date End Date Taking? Authorizing Provider  aspirin EC 81 MG tablet Take 81 mg by mouth daily.   Yes [provider]  atorvastatin (LIPITOR) 80 MG tablet Take 1 tablet (80 mg total) by mouth daily. 07/28/19 04/07/20 Yes [place], [place]  M, PA-C  diltiazem (DILT-XR) 240 MG 24 hr capsule TAKE 1 CAPSULE BY MOUTH EVERY DAY 01/05/20  Yes Herminio Commons, MD  furosemide (LASIX) 40 MG tablet TAKE ONE DAILY. MAY TAKE AN EXTRA TABLET AS NEEDED FOR EDEMA. 12/25/19  Yes Herminio Commons, MD  metoprolol succinate (TOPROL-XL) 100 MG 24 hr tablet Take 1 tablet (100 mg total) by mouth daily. Take with or immediately following a meal. 04/24/19 04/18/20 Yes [place], [place] M, PA-C  nitroGLYCERIN (NITROSTAT) 0.4 MG SL tablet DISSOLVE 1 TABLET UNDER THE TONGUE EVERY 5 MINUTES UP TO 15 MINUTES FOR CHEST PAIN Patient taking differently: Place 0.4 mg under the tongue every 5 (five) minutes as needed for chest pain. DISSOLVE 1 TABLET UNDER THE TONGUE EVERY 5 MINUTES UP TO 15 MINUTES FOR CHEST PAIN 01/29/18  Yes [name], MD  warfarin (COUMADIN) 7.5 MG tablet Take 1/2 to 1 tablet daily as directed by coumadin clinic Patient taking differently: . Monday and Thursday 1 & 1/2 tab and 1 tab on all other days. 12/16/19  Yes Herminio Commons, MD    Allergies    Augmentin [amoxicillin-pot clavulanate]  Review of  Systems   Review of Systems  Constitutional: Negative for fever.  Eyes: Negative for visual disturbance.  Respiratory: Positive for shortness of breath.   Cardiovascular: Positive for chest pain. Negative for palpitations.  Gastrointestinal: Positive for nausea. Negative for abdominal pain and vomiting.  Genitourinary: Negative for difficulty urinating.  Musculoskeletal: Negative for arthralgias and myalgias.  Skin: Negative for rash and wound.  Neurological: Positive for dizziness. Negative for speech difficulty, weakness and numbness.  Psychiatric/Behavioral: Positive for confusion.  All other systems reviewed and are negative.   Physical Exam Updated Vital Signs BP 116/76   Pulse (!) 55   Temp 98.1 F (36.7 C) (Oral)   Resp 20   Ht 5\' 11"  (1.803 m)   Wt 104.3 kg   SpO2 92%   BMI 32.08 kg/m   Physical Exam Vitals and nursing note reviewed.  Constitutional:      General: He is not in acute distress.    Appearance: He is well-developed. He is not diaphoretic.  HENT:     Head: Normocephalic and atraumatic.  Cardiovascular:     Rate and Rhythm: Normal rate. Rhythm irregular.     Heart sounds: Murmur heard.      Comments: Feet are warm to the touch with sensation intact, pulses not palpated PT/DP, patient states this is normal for him with his PAD. Pulmonary:     Effort: Pulmonary effort is normal.     Breath sounds: Normal breath sounds.  Chest:     Chest wall: No tenderness.  Abdominal:     Palpations: Abdomen is soft.     Tenderness: There is no abdominal tenderness.  Musculoskeletal:     Right lower leg: Edema present.     Left lower leg: Edema present.     Comments: Mild pitting edema to bilateral lower extremities.   Skin:    General: Skin is warm and dry.  Neurological:     General: No focal deficit present.     Mental Status: He is alert and oriented to person, place, and time.     Cranial Nerves: No cranial nerve deficit.     Motor: No weakness.    Psychiatric:        Behavior: Behavior normal.     ED Results / Procedures / Treatments   Labs (all labs ordered are  listed, but only abnormal results are displayed) Labs Reviewed  BASIC METABOLIC PANEL - Abnormal; Notable for the following components:      Result Value   Glucose, Bld 115 (*)    All other components within normal limits  CBC - Abnormal; Notable for the following components:   RBC 3.28 (*)    Hemoglobin 12.1 (*)    HCT 35.9 (*)    MCV 109.5 (*)    MCH 36.9 (*)    All other components within normal limits  PROTIME-INR - Abnormal; Notable for the following components:   Prothrombin Time 26.4 (*)    INR 2.5 (*)    All other components within normal limits  TROPONIN I (HIGH SENSITIVITY)  TROPONIN I (HIGH SENSITIVITY)    EKG EKG Interpretation  Date/Time:  Thursday April 07 2020 11:20:22 EDT Ventricular Rate:  74 PR Interval:    QRS Duration: 97 QT Interval:  396 QTC Calculation: 440 R Axis:   100 Text Interpretation: Atrial fibrillation Right axis deviation No acute changes No significant change since last tracing Confirmed by Varney Biles ([id number]) on 04/07/2020 1:36:02 PM   Radiology DG Chest Port 1 View  Result Date: 04/07/2020 CLINICAL DATA:  Chest pain.  History of atrial fibrillation EXAM: PORTABLE CHEST 1 VIEW COMPARISON:  April 08, 2019 FINDINGS: There is cardiomegaly with pulmonary vascularity normal. There is no edema or airspace opacity. Patient is status post coronary artery bypass grafting. There is aortic atherosclerosis. No adenopathy. There is evidence of old trauma involving the lateral right clavicle with acromioclavicular separation on the right. IMPRESSION: Stable cardiac prominence. Status post coronary artery bypass grafting. No edema or airspace opacity. Aortic Atherosclerosis (ICD10-I70.0). Electronically Signed   By: Lowella Grip III M.D.   On: 04/07/2020 12:21    Procedures Procedures (including critical care  time)  Medications Ordered in ED Medications - No data to display  ED Course  I have reviewed the triage vital signs and the nursing notes.  Pertinent labs & imaging results that were available during my care of the patient were reviewed by me and considered in my medical decision making (see chart for details).  Clinical Course as of Apr 07 1645  Thu Apr 07, 7157  [id number] 79 year old male with history listed above. On exam, patient is well appearing, no complaints of chest discomfort at this time. Mild pitting edema to bilateral lower legs.  CXR without acute changes, CBC and BMP without significant changes, INR therapeutic at 2.5, troponin 10, 9. EKG a-fib, no acute ischemic changes.  Patient remains asymptomatic. Plan is to discuss with cardiology for likely discharge home, patient is agreeable with this plan.    [LM]  1646 Discussed with Dr. Roxy Manns can be discharged with close cardiology follow-up.  Patient was given strict return to ER precautions.   [LM]    Clinical Course User Index [LM] Roque Lias   MDM Rules/Calculators/A&P                          Final Clinical Impression(s) / ED Diagnoses Final diagnoses:  Atypical chest pain    Rx / DC Orders ED Discharge Orders    None       Tacy Learn, PA-C 04/07/20 1647    Varney Biles, MD 04/08/20 1150

## 2020-04-15 DIAGNOSIS — M48061 Spinal stenosis, lumbar region without neurogenic claudication: Secondary | ICD-10-CM | POA: Diagnosis not present

## 2020-04-15 DIAGNOSIS — M4726 Other spondylosis with radiculopathy, lumbar region: Secondary | ICD-10-CM | POA: Diagnosis not present

## 2020-04-17 IMAGING — MR MR LUMBAR SPINE W/O CM
4 of 5 series · 14 of 48 positions shown · non-contrast
Comparison: Lumbar spine x-rays dated April 07, 2018.

CLINICAL DATA: Back and right thigh pain after fall 2 days ago.

EXAM:
MRI LUMBAR SPINE WITHOUT CONTRAST
TECHNIQUE: Multiplanar, multisequence MR imaging of the lumbar spine was
performed. No intravenous contrast was administered.

[Series 3: T2 · sagittal · 4.0mm · 0.74mm/px · 5 of 15 slices shown (1 of 2)]
[im 1/15]
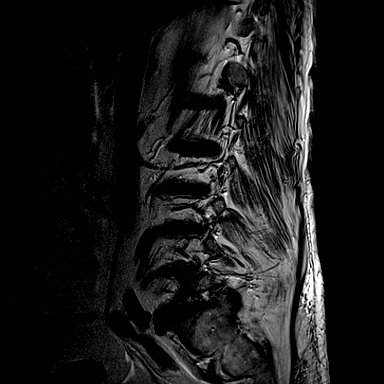
[im 4/15]
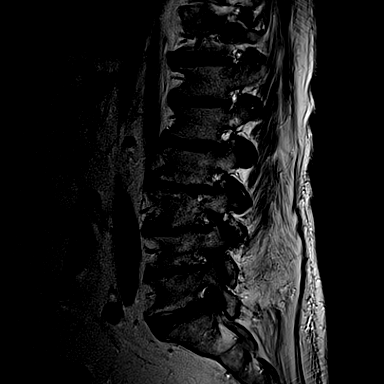
[im 8/15]
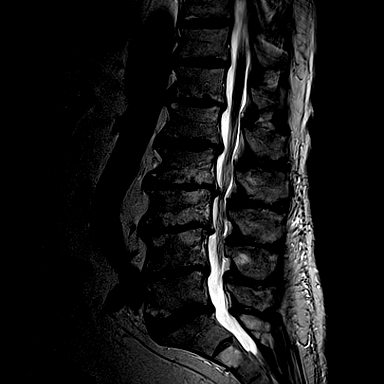
[im 11/15]
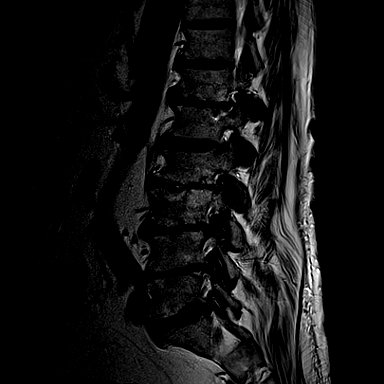
[im 15/15]
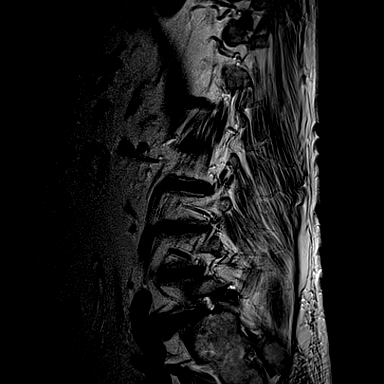

[Series 4: T1 · sagittal · 4.0mm · 0.37mm/px · 3 of 15 slices shown (1 of 2)]
[im 1/15]
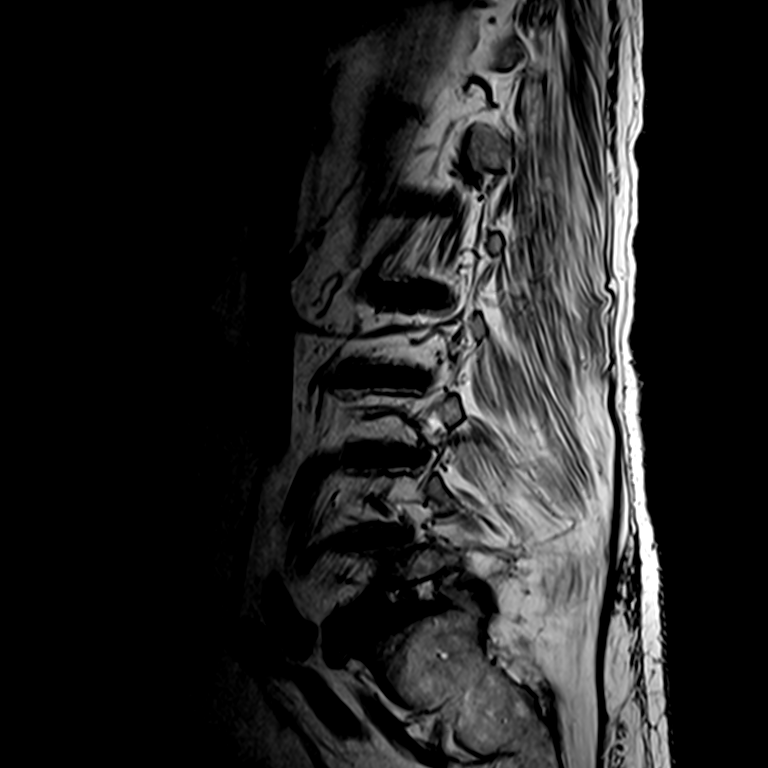
[im 8/15]
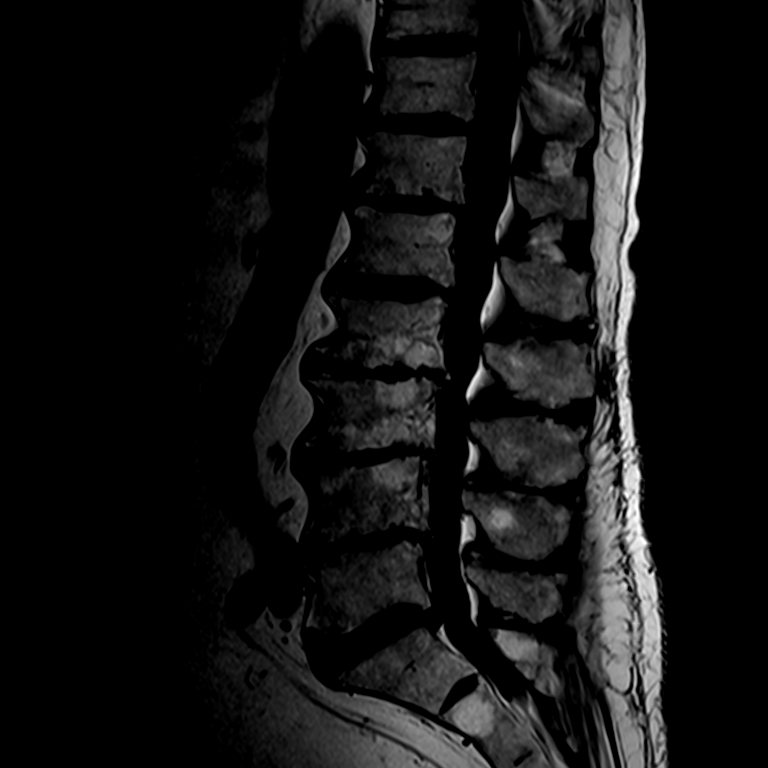
[im 15/15]
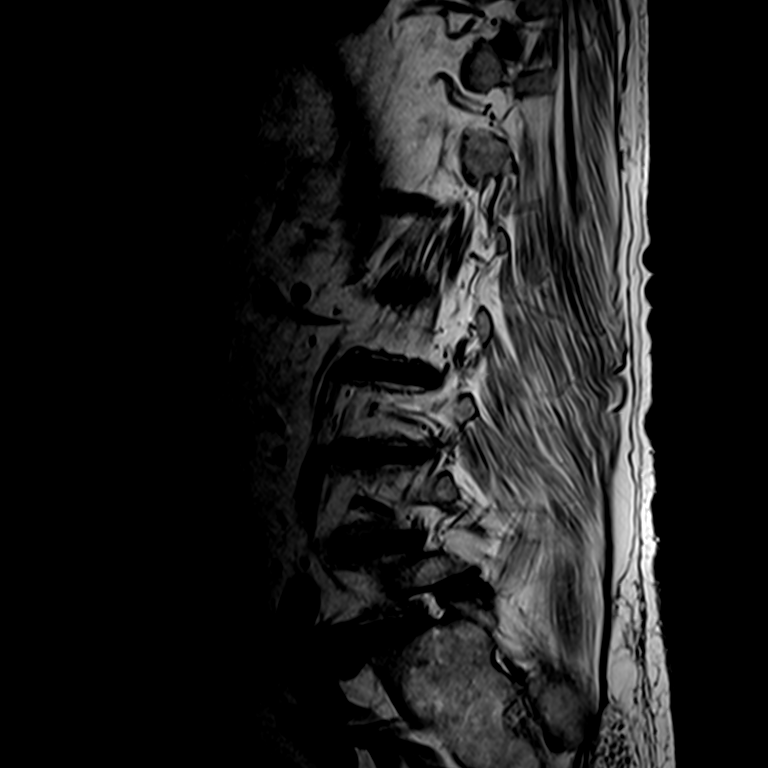

[Series 6: T2 · axial · 4.0mm · 0.21mm/px · z∈[-219,-54]mm · 3 of 48 slices shown (2 of 2)]
[im 7/48]
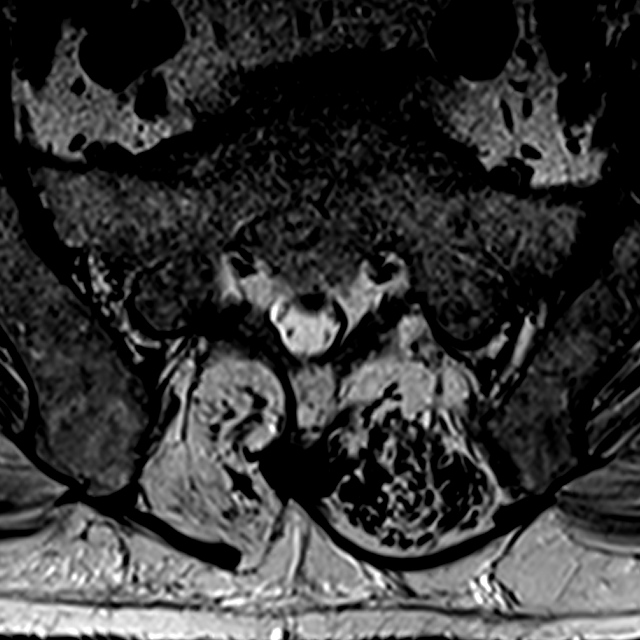
[im 26/48]
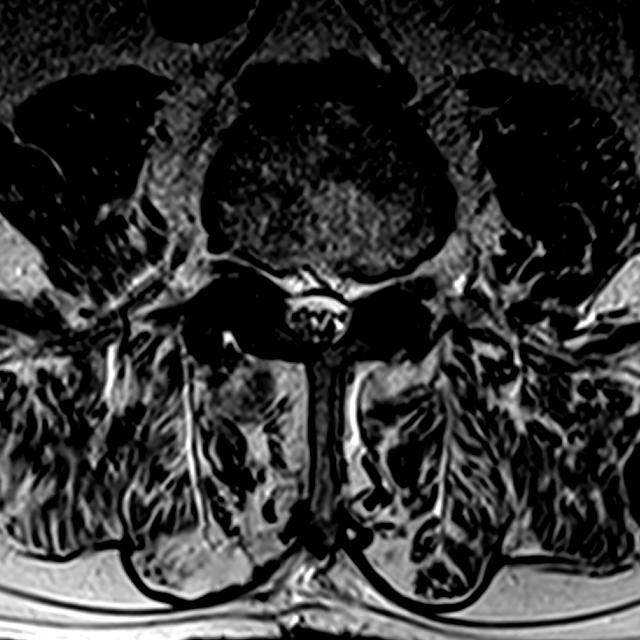
[im 41/48]
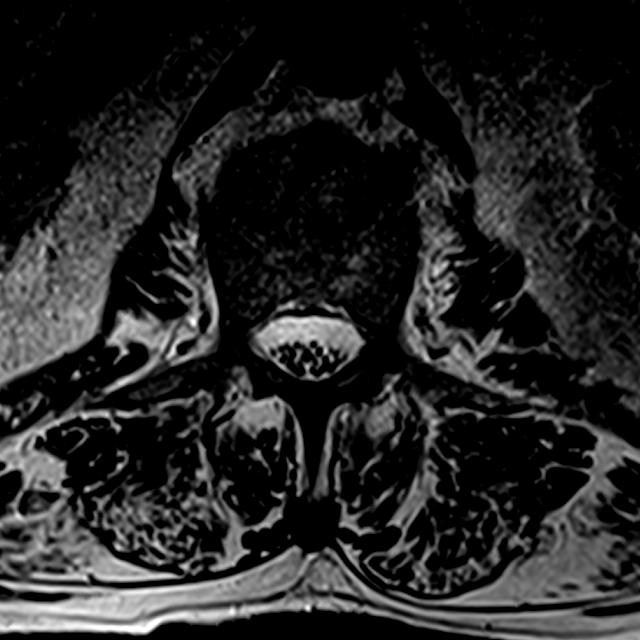

[Series 7: T1 · axial · 4.0mm · 0.23mm/px · z∈[-222,-53]mm · 3 of 48 slices shown (2 of 2)]
[im 7/48]
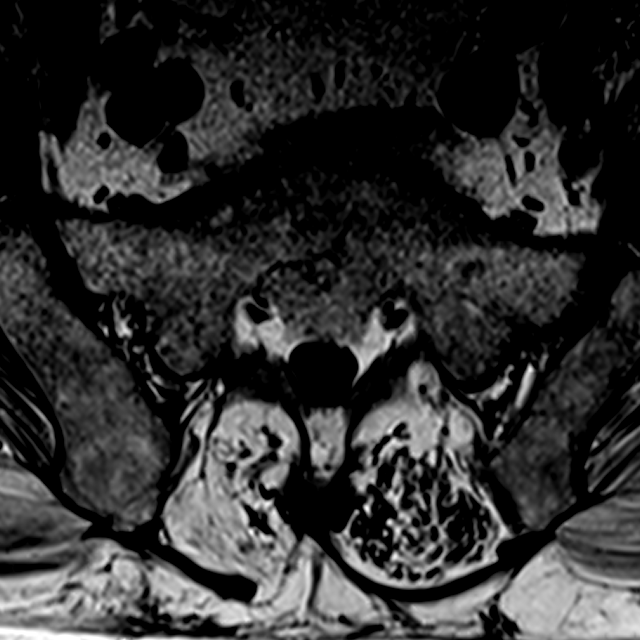
[im 26/48]
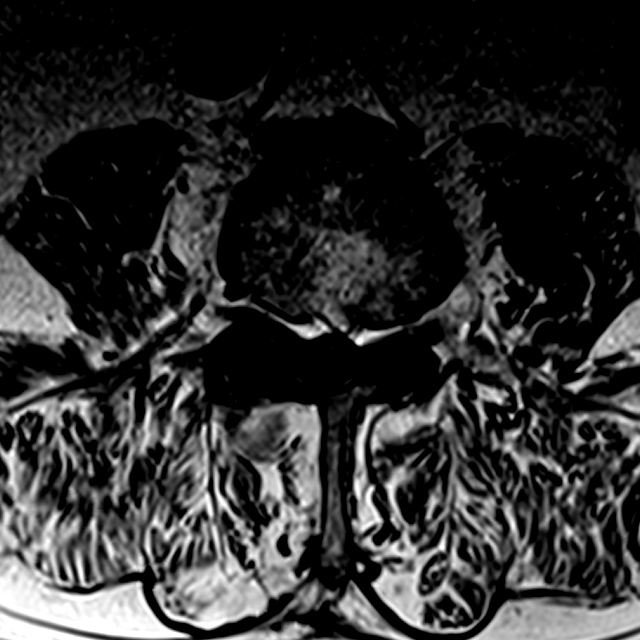
[im 41/48]
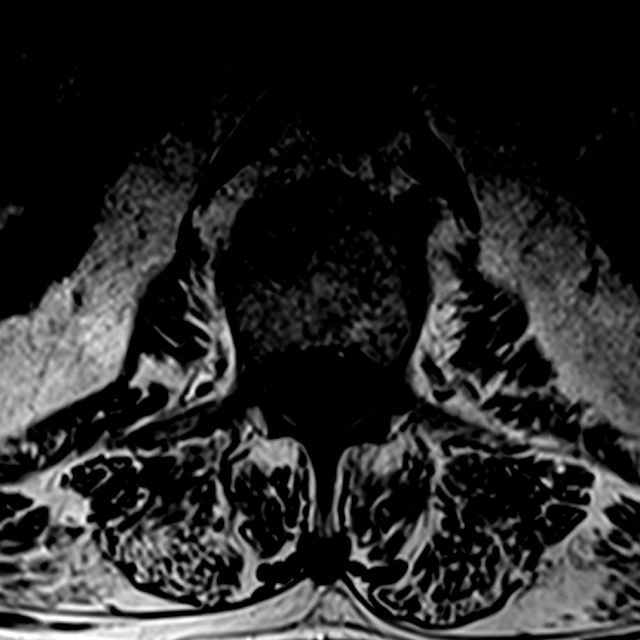

[14 of 48 positions shown; findings below may reference images not displayed]

FINDINGS: Segmentation:  Standard.

Alignment: Straightening of the normal lumbar lordosis. Trace
retrolisthesis at L3-L4.

Vertebrae: No fracture, evidence of discitis, or bone lesion.
Scattered degenerative endplate marrow changes.

Conus medullaris and cauda equina: Conus extends to the L1 level.
Conus and cauda equina appear normal.

Paraspinal and other soft tissues: Right greater than left
paraspinous muscle atrophy.

Disc levels:

T12-L1:  Circumferential disc osteophyte complex.  No stenosis.

L1-L2: Disc bulging and endplate spurring. Small right foraminal and
extraforaminal disc protrusion encroaching on the right L1 nerve
root. Mild spinal canal stenosis. No neuroforaminal stenosis.

L2-L3: Large circumferential disc osteophyte complex resulting in
moderate central spinal canal stenosis. No neuroforaminal stenosis.

L3-L4: Circumferential disc osteophyte complex and mild-to-moderate
bilateral facet arthropathy mild central spinal canal stenosis and
moderate right lateral recess stenosis potentially affecting the
descending right L4 nerve root. Mild bilateral neuroforaminal
stenosis.

L4-L5: Circumferential disc osteophyte complex and mild bilateral
facet arthropathy. Mild bilateral neuroforaminal stenosis. No spinal
canal stenosis.

L5-S1: Diffuse disc bulge with large right foraminal and
extraforaminal disc osteophyte complex potentially affecting the
exiting right L5 nerve root. Tiny superimposed central disc
protrusion. Mild bilateral facet arthropathy. Moderate bilateral
neuroforaminal stenosis. No spinal canal stenosis.
IMPRESSION: 1.  No acute osseous abnormality.
2. Moderate multilevel degenerative changes throughout the lumbar
spine as described above.
3. Moderate spinal canal stenosis at L2-L3.
4. Large right foraminal and extraforaminal disc osteophyte complex
at L5-S1 potentially affecting the exiting right L5 nerve root.
Moderate bilateral neuroforaminal stenosis at this level.

## 2020-04-19 ENCOUNTER — Ambulatory Visit (INDEPENDENT_AMBULATORY_CARE_PROVIDER_SITE_OTHER): Payer: Medicare HMO | Admitting: *Deleted

## 2020-04-19 DIAGNOSIS — Z5181 Encounter for therapeutic drug level monitoring: Secondary | ICD-10-CM

## 2020-04-19 DIAGNOSIS — I482 Chronic atrial fibrillation, unspecified: Secondary | ICD-10-CM

## 2020-04-19 DIAGNOSIS — Z7901 Long term (current) use of anticoagulants: Secondary | ICD-10-CM | POA: Diagnosis not present

## 2020-04-19 LAB — POCT INR: INR: 5.1 — AB (ref 2.0–3.0)

## 2020-04-19 NOTE — Patient Instructions (Signed)
Hold warfarin Wednesday and Thursdays and recheck INR at MD appt on 8/19 with Jonni Sanger.  Will talk to [place] about changing to [employer]

## 2020-04-21 ENCOUNTER — Ambulatory Visit (INDEPENDENT_AMBULATORY_CARE_PROVIDER_SITE_OTHER): Payer: Medicare HMO | Admitting: *Deleted

## 2020-04-21 ENCOUNTER — Ambulatory Visit (INDEPENDENT_AMBULATORY_CARE_PROVIDER_SITE_OTHER): Payer: Medicare HMO | Admitting: Family Medicine

## 2020-04-21 ENCOUNTER — Encounter: Payer: Self-pay | Admitting: Family Medicine

## 2020-04-21 ENCOUNTER — Other Ambulatory Visit: Payer: Self-pay | Admitting: Student

## 2020-04-21 VITALS — BP 120/70 | HR 76 | Ht 70.0 in | Wt 217.8 lb

## 2020-04-21 DIAGNOSIS — E785 Hyperlipidemia, unspecified: Secondary | ICD-10-CM | POA: Diagnosis not present

## 2020-04-21 DIAGNOSIS — I5032 Chronic diastolic (congestive) heart failure: Secondary | ICD-10-CM

## 2020-04-21 DIAGNOSIS — I482 Chronic atrial fibrillation, unspecified: Secondary | ICD-10-CM | POA: Diagnosis not present

## 2020-04-21 DIAGNOSIS — I350 Nonrheumatic aortic (valve) stenosis: Secondary | ICD-10-CM

## 2020-04-21 DIAGNOSIS — I4821 Permanent atrial fibrillation: Secondary | ICD-10-CM | POA: Diagnosis not present

## 2020-04-21 DIAGNOSIS — I10 Essential (primary) hypertension: Secondary | ICD-10-CM | POA: Diagnosis not present

## 2020-04-21 DIAGNOSIS — I2510 Atherosclerotic heart disease of native coronary artery without angina pectoris: Secondary | ICD-10-CM | POA: Diagnosis not present

## 2020-04-21 DIAGNOSIS — Z7901 Long term (current) use of anticoagulants: Secondary | ICD-10-CM

## 2020-04-21 LAB — POCT INR: INR: 2.3 (ref 2.0–3.0)

## 2020-04-21 MED ORDER — NITROGLYCERIN 0.4 MG SL SUBL: 0.4000 mg | SUBLINGUAL_TABLET | SUBLINGUAL | 3 refills | Status: DC | PRN

## 2020-04-21 NOTE — Progress Notes (Signed)
Cardiology Office Note  Date: 04/21/2020   ID: Leonard Cisneros, DOB [birth year], MRN [id number]  PCP:  [name], MD (Inactive)  Cardiologist:  No primary care provider on file. Electrophysiologist:  None   Chief Complaint: Follow-up CAD, chronic diastolic heart failure, permanent atrial fibrillation, HTN, HLD, PAD, aortic stenosis  History of Present Illness: Leonard Cisneros is a 79 y.o. male with a history of  CAD, chronic diastolic heart failure, permanent atrial fibrillation, HTN, HLD, PAD, aortic stenosis  Status post CABG 2003 with cath in 2017: Patent LIMA-LAD, patent RIMA-distal RCA, 99% stenosis of SVG-RI treated with BMS.  Recent stress test 10/23/2018 low risk.  Last encounter via telemedicine 08/18/2019 with Dr. Bronson Ing.  [name] CAD was symptomatically stable.  He was continuing beta-blocker and statin.  Not on aspirin given need for anticoagulation. Chronic diastolic heart failure with symptomatically stable with no signs of hypervolemia.  He was instructed on weighing daily and take an extra dose of Lasix for weight gain greater than 3 pounds overnight or 5 pounds in 1 week.  Blood pressure was normal and there were no changes to therapy.  LDL was at goal continue atorvastatin 80 mg.  Aortic stenosis mild in severity by echo February 2020.  Saw Dr.Arida for evaluation of PAD September 2020 and felt he had small vessel disease without inflow disease and no claudication symptoms.  He was last seen by me in the office on February 17, 2020.  Denied any recent acute illnesses, hospitalizations.  Denied any progressive anginal or exertional symptoms, sensation of palpitations or arrhythmias, orthostatic symptoms, bleeding, PND, orthopnea, lower extremity edema.  Did have some mild lower extremity pain when performing activities.  Had a previous study for PAD in September 2020 which apparently showed small vessel disease.  Stated he had recent lab work with Dr. Wolfgang Phoenix and  everything was normal.   Recent visit to emergency room on April 07, 2020 for atypical chest pain.  He reported having some chest pain on the left side.  He reported left-sided chest discomfort the previous day which was brief.  Returned this morning at 3 AM and waking him from sleep and was constant until 11 AM.  He described it as feeling disoriented, dizzy, nauseous, short of breath and diaphoretic.  Symptoms had relieved upon arrival to the emergency room and had not returned.  He was chest pain-free on exam.  He stated his symptoms were similar to his previous pain that led to obstructive coronary artery disease discovery.  Initial high-sensitivity troponin was normal.  Troponins were ten, nine.  EKG showed atrial fibrillation, no acute ischemic changes.  INR was therapeutic at 2.5.  Chest x-ray without acute changes, CBC and BMP without significant changes.  Patient remained asymptomatic.  He was to follow-up with cardiology.  He presents today for follow-up status post recent emergency room visit.  He states since the episode on 5 August he has had no further chest pain, chest tightness, radiation to neck, arm, back, or jaw.  No nausea, vomiting, or diaphoresis.  No exertional dyspnea.  Denies any more dizziness.  He states this chest pain did not feel like it did when he required bypass or when he needed a stent.  States he has been doing some exertional activities such as hanging a bird house up and had no chest pain pressure tightness or radiation.  He denies any palpitations when this is occurred.  He denies any orthostatic symptoms, CVA or TIA-like symptoms,  PND or orthopnea, dyspeptic symptoms, blood in stool or urine.  Denies any claudication-like symptoms, DVT or PE-like symptoms.  Patient does have some chronic lower extremity edema.  States he has some venous is insufficiency.   Past Medical History:  Diagnosis Date  . Atrial flutter ([place])    a. s/p remote ablation by Dr. [name].  [name] CAD  (coronary artery disease)    a. s/p CABG 2003. b. 01/2016: unstable angina - s/p BMS to SVG-ramus intermediate, patent LIMA-mLAD, patent RIMA-dRCA.   [name] CHF (congestive heart failure) ([place]) 11/2010   seen at [facility] then started lasix  . Chronic atrial fibrillation (HCC)    a. initially post op CABG then chronic. On Coumadin.  . Chronic lower back pain   . Dysrhythmia 2000   4 or 5 cardioversions.  . Essential hypertension   . High cholesterol   . Ischemic cardiomyopathy    a. Cath 01/2016 -  EF 50-55% with mild distal inferior hypocontractility.  . Mild aortic valve stenosis    PER ECHO 10-23-2018 IN [facility]   . Unspecified hereditary and idiopathic peripheral neuropathy 04/04/2014    Past Surgical History:  Procedure Laterality Date  . BACK SURGERY    . CARDIAC CATHETERIZATION  ~ 2000; 2003  . CARDIAC CATHETERIZATION N/A 02/01/2016   Procedure: Left Heart Cath and Cors/Grafts Angiography;  Surgeon: [name], MD;  Location: [facility];  Service: Cardiovascular;  Laterality: N/A;  . CARDIAC CATHETERIZATION N/A 02/01/2016   Procedure: Coronary Stent Intervention;  Surgeon: [name], MD;  Location: [facility];  Service: Cardiovascular;  Laterality: N/A;  . CATARACT EXTRACTION W/PHACO Left 02/09/2019   Procedure: CATARACT EXTRACTION PHACO AND INTRAOCULAR LENS PLACEMENT (IOC);  Surgeon: [name], MD;  Location: [facility];  Service: Ophthalmology;  Laterality: Left;  CDE: 27.70  . CATARACT EXTRACTION W/PHACO Right 02/23/2019   Procedure: CATARACT EXTRACTION PHACO AND INTRAOCULAR LENS PLACEMENT RIGHT EYE;  Surgeon: [name], MD;  Location: [facility];  Service: Ophthalmology;  Laterality: Right;  CDE: 10.15  . COLONOSCOPY  06/04/2002   Normal colon and rectum  . COLONOSCOPY  07/08/2012   Procedure: COLONOSCOPY;  Surgeon: [name], MD;  Location: [facility];  Service: Endoscopy;  Laterality: N/A;  11:30  . COLONOSCOPY N/A 10/02/2017   Procedure: COLONOSCOPY;  Surgeon:  [name], MD;  Location: [facility];  Service: Endoscopy;  Laterality: N/A;  10:30am  . CORONARY ANGIOPLASTY WITH STENT PLACEMENT  02/01/2016  . CORONARY ARTERY BYPASS GRAFT  2003   LIMA to LAD,LIMA to distal RCA,SVG to ramus intermediate vessel.  . FEMORAL REVISION Right 03/30/2019   Procedure: Femoral revision right total hip arthroplasty;  Surgeon: [name], MD;  Location: [facility];  Service: Orthopedics;  Laterality: Right;  165min  . FOREIGN BODY REMOVAL Right 04/02/2014   Procedure: FOREIGN BODY REMOVAL RIGHT FOOT;  Surgeon: [name], MD;  Location: [facility];  Service: General;  Laterality: Right;  . JOINT REPLACEMENT    . JOINT REPLACEMENT    . LUMBAR DISC SURGERY    . REVISION TOTAL HIP ARTHROPLASTY Bilateral 2005-2012   right-left  . SHOULDER OPEN ROTATOR CUFF REPAIR Right   . TOTAL HIP ARTHROPLASTY Right 1999  . TOTAL HIP ARTHROPLASTY Left 2008  . 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  Current Outpatient Medications  Medication Sig Dispense Refill  . aspirin EC 81 MG tablet Take 81 mg by mouth daily.    [name] atorvastatin (LIPITOR) 80 MG tablet Take 1 tablet (80 mg total) by mouth daily. 90 tablet 2  . diltiazem (DILT-XR) 240 MG 24 hr capsule TAKE 1 CAPSULE BY MOUTH EVERY DAY 90 capsule 1  . furosemide (LASIX) 40 MG tablet TAKE ONE DAILY. MAY TAKE AN EXTRA TABLET AS NEEDED FOR EDEMA. 180 tablet 1  . metoprolol succinate (TOPROL-XL) 100 MG 24 hr tablet TAKE 1 TABLET (100 MG TOTAL) BY MOUTH DAILY. TAKE WITH OR IMMEDIATELY FOLLOWING A MEAL. 90 tablet 1  . nitroGLYCERIN (NITROSTAT) 0.4 MG SL tablet Place 1 tablet (0.4 mg total) under the tongue every 5 (five) minutes x 3 doses as needed for chest pain (if no relief after 3rd dose, proceed to the ED for an evaluation or call 911). 25  tablet 3  . warfarin (COUMADIN) 7.5 MG tablet Take 1/2 to 1 tablet daily as directed by coumadin clinic (Patient taking differently: . Monday and Thursday 1 & 1/2 tab and 1 tab on all other days.) 90 tablet 1   No current facility-administered medications for this visit.   Allergies:  Augmentin [amoxicillin-pot clavulanate]   Social History: The patient  reports that he quit smoking about 53 years ago. His smoking use included cigarettes. He has a 1.00 pack-year smoking history. He has never used smokeless tobacco. He reports current alcohol use of about 11.0 standard drinks of alcohol per week. He reports that he does not use drugs.   Family History: The patient's family history includes Heart attack in his brother.   ROS:  Please see the history of present illness. Otherwise, complete review of systems is positive for none.  All other systems are reviewed and negative.   Physical Exam: VS:  BP 120/70   Pulse 76   Ht 5\' 10"  (1.778 m)   Wt 217 lb 12.8 oz (98.8 kg)   SpO2 98%   BMI 31.25 kg/m , BMI Body mass index is 31.25 kg/m.  Wt Readings from Last 3 Encounters:  04/21/20 217 lb 12.8 oz (98.8 kg)  04/07/20 230 lb (104.3 kg)  02/17/20 227 lb 12.8 oz (103.3 kg)    General: Patient appears comfortable at rest. Neck: Supple, no elevated JVP or carotid bruits, no thyromegaly. Lungs: Clear to auscultation, nonlabored breathing at rest. Cardiac: Irregularly irregular rate and rhythm, no S3 mild systolic murmur heard throughout school patient points  2/6, no pericardial rub. Extremities: Mild pitting edema, venous stasis changes, distal pulses 2+. Skin: Warm and dry. Musculoskeletal: No kyphosis. Neuropsychiatric: Alert and oriented x3, affect grossly appropriate.  ECG:  EKG April 08, 2019 showed atrial fibrillation with rate of 111, right axis deviation, low voltage, extremity in precordial leads.  Recent Labwork: 04/07/2020: BUN 13; Creatinine, Ser 0.73; Hemoglobin 12.1; Platelets  207; Potassium 3.7; Sodium 140     Component Value Date/Time   CHOL 126 10/23/2018 0050   CHOL 187 06/03/2018 0749   TRIG 53 10/23/2018 0050   HDL 74 10/23/2018 0050   HDL 77 06/03/2018 0749   CHOLHDL 1.7 10/23/2018 0050   VLDL 11 10/23/2018 0050   LDLCALC 41 10/23/2018 0050   LDLCALC 95 06/03/2018 0749    Other Studies Reviewed Today:  Echocardiogram 03/22/2020  Left ventricular ejection fraction, by estimation, is 50 to 55%. The left ventricle has low normal function. The left ventricle demonstrates regional wall motion abnormalities (see  scoring diagram/findings for description). There is moderate asymmetric left ventricular hypertrophy of the septal segment. Left ventricular diastolic parameters are indeterminate. 2. Right ventricular systolic function is normal. The right ventricular size is normal. There is normal pulmonary artery systolic pressure. 3. Left atrial size was severely dilated. 4. Right atrial size was mildly dilated. 5. The mitral valve is abnormal, moderate annular calcification. Mild to moderate mitral valve regurgitation. 6. 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 Aortic valve regurgitation is not visualized. Moderate aortic valve stenosis. Aortic valve area, by VTI measures 1.49 cm. Aortic valve mean gradient measures 15.5 mmHg. Dimentionless index 0.39. 7. The inferior vena cava is normal in size with greater than 50% respiratory variability, suggesting right atrial pressure of 3 mmHg.    NST: 10/2018  There was no ST segment deviation noted during stress.  The study is normal. There are no perfusion defects  This is a low risk study.  The left ventricular ejection fraction is normal (55-65%).   Echocardiogram: 10/2018 IMPRESSIONS  1. The left ventricle has normal systolic function, with an ejection fraction of 55-60%. The cavity size was normal. There is moderately increased left ventricular wall thickness. Left  ventricular diastology could not be evaluated secondary to atrial  fibrillation. 2. The right ventricle has normal systolic function. The cavity was normal. There is no increase in right ventricular wall thickness. 3. Left atrial size was severely dilated. 4. Right atrial size was moderately dilated. 5. The mitral valve is normal in structure. Moderate thickening of the mitral valve leaflet. No evidence of mitral valve stenosis. 6. The tricuspid valve is normal in structure. 7. The aortic valve is tricuspid Moderate thickening of the aortic valve mild stenosis of the aortic valve. 8. The aortic root is normal in size and structure. 9. Pulmonary hypertension is indeterminant, inadequate TR jet. 10. The inferior vena cava was dilated in size with >50% respiratory variability.  ABI: 04/2019 Summary: Right: Resting right ankle-brachial index indicates noncompressible right lower extremity arteries.The right toe-brachial index is abnormal. ABIs are unreliable.  Left: Resting left ankle-brachial index indicates noncompressible left lower extremity arteries.The left toe-brachial index is abnormal. ABIs are unreliable.  Assessment and Plan:   1. CAD in native artery S/P CABG 2003. Cath 2017: Patent LIMA-LAD, patent RIMA-RCA, 99% stenosis of SVG RI treated with BMS.  Recent stress test low risk, symptomatically stable.  Continue aspirin 81 mg.  Continue sublingual nitroglycerin as needed for chest pain.  Denies any recent anginal or exertional symptoms since hospital visit.  Please refill nitroglycerin sublingual.  2. Permanent atrial fibrillation (HCC) On Cardizem CD and Toprol for rate control.  On Coumadin for anticoagulation.  Continue diltiazem 240 daily.  Continue aspirin 81 mg.  Continue warfarin 7.5 mg 1/2 to 1 tablet daily as directed by Coumadin clinic.  Heart rate today 76 and irregularly irregular.  Patient is uncertain whether his heart rate was rapid or fast when he had the  chest pains recently.  He states he usually cannot tell when his heart is fast and irregular.  I advised him to purchase a pulse oximetry probe and check his heart rate the next time he has another episode of chest pain, nausea, dizziness to see if it is fast and to call us if this occurs.  3. Chronic diastolic heart failure (HCC) On Lasix 40 mg daily.  Weight has been controlled.  His weight is down today.  Today he weighs 217 pounds down from 230 on April 07, 2020.  Continue Lasix 40 mg daily and as needed for lower extremity edema.  Continue Toprol-XL 100 mg daily.   4. Essential hypertension Blood pressure today 120/70.  Heart rate 76   continue Toprol-XL 100 mg daily.  5. Hyperlipidemia LDL goal <70 Last LDL at goal.  Continue statin medication atorvastatin 80 mg daily.    6. Nonrheumatic aortic valve stenosis Mild on last echo 10/23/2018.  Mild systolic murmur heard throughout all auscultation points today.   Repeat echo on 03/22/2020 showed Moderate aortic valve stenosis. Aortic valve area, by VTI measures 1.49 cm. Aortic valve mean gradient measures 15.5 mmHg. Dimentionless index 0.39.  He denies any significant dyspnea on exertion, orthostatic symptoms, or chest pain on exertion.   Medication Adjustments/Labs and Tests Ordered: Current medicines are reviewed at length with the patient today.  Concerns regarding medicines are outlined above.   Disposition: Follow-up with Dr. Domenic Polite or APP 3 months  Signed, Levell July, NP 04/21/2020 5:01 PM    [facility] at [place], [place], [place] [address] Phone: [phone]; Fax: [phone]

## 2020-04-21 NOTE — Patient Instructions (Addendum)
Medication Instructions:   Your physician recommends that you continue on your current medications as directed. Please refer to the Current Medication list given to you today.  Labwork:  NONE  Testing/Procedures:  NONE  Follow-Up:  Your physician recommends that you schedule a follow-up appointment in: 3 months.  Any Other Special Instructions Will Be Listed Below (If Applicable).  If you need a refill on your cardiac medications before your next appointment, please call your pharmacy. 

## 2020-04-21 NOTE — Telephone Encounter (Signed)
This is a [facility] pt °

## 2020-04-21 NOTE — Patient Instructions (Signed)
Start warfarin 1 tablet daily Will talk to [facility] about changing to [employer] in 3 wks.

## 2020-04-26 ENCOUNTER — Other Ambulatory Visit: Payer: Self-pay | Admitting: Family Medicine

## 2020-05-11 ENCOUNTER — Other Ambulatory Visit: Payer: Self-pay

## 2020-05-11 ENCOUNTER — Ambulatory Visit (INDEPENDENT_AMBULATORY_CARE_PROVIDER_SITE_OTHER): Payer: Medicare HMO | Admitting: Pharmacist

## 2020-05-11 DIAGNOSIS — I482 Chronic atrial fibrillation, unspecified: Secondary | ICD-10-CM | POA: Diagnosis not present

## 2020-05-11 DIAGNOSIS — Z7901 Long term (current) use of anticoagulants: Secondary | ICD-10-CM

## 2020-05-11 LAB — POCT INR: INR: 2.2 (ref 2.0–3.0)

## 2020-05-11 NOTE — Patient Instructions (Signed)
Description   Continue warfarin 7.5mg  daily.  Recheck in 4 weeks.

## 2020-06-08 ENCOUNTER — Ambulatory Visit (INDEPENDENT_AMBULATORY_CARE_PROVIDER_SITE_OTHER): Payer: Medicare HMO | Admitting: *Deleted

## 2020-06-08 DIAGNOSIS — Z7901 Long term (current) use of anticoagulants: Secondary | ICD-10-CM | POA: Diagnosis not present

## 2020-06-08 DIAGNOSIS — Z5181 Encounter for therapeutic drug level monitoring: Secondary | ICD-10-CM | POA: Diagnosis not present

## 2020-06-08 DIAGNOSIS — I4891 Unspecified atrial fibrillation: Secondary | ICD-10-CM | POA: Diagnosis not present

## 2020-06-08 DIAGNOSIS — I482 Chronic atrial fibrillation, unspecified: Secondary | ICD-10-CM

## 2020-06-08 LAB — POCT INR: INR: 2.7 (ref 2.0–3.0)

## 2020-06-08 NOTE — Patient Instructions (Signed)
Continue warfarin 7.5mg daily.  Recheck in 4 weeks. °

## 2020-06-15 ENCOUNTER — Telehealth: Payer: Self-pay | Admitting: Family Medicine

## 2020-06-15 ENCOUNTER — Other Ambulatory Visit: Payer: Self-pay

## 2020-06-15 DIAGNOSIS — I4891 Unspecified atrial fibrillation: Secondary | ICD-10-CM

## 2020-06-15 DIAGNOSIS — I482 Chronic atrial fibrillation, unspecified: Secondary | ICD-10-CM

## 2020-06-15 NOTE — Telephone Encounter (Signed)
[facility] will be re-faxing the order they got for a bi-weekly PT-INR Request to the office. The dates for the standing order were cut off and they need a clarification of the dates

## 2020-06-16 ENCOUNTER — Other Ambulatory Visit: Payer: Self-pay | Admitting: Student

## 2020-06-16 NOTE — Telephone Encounter (Signed)
This is a [facility] pt °

## 2020-06-17 ENCOUNTER — Other Ambulatory Visit: Payer: Self-pay

## 2020-06-17 MED ORDER — FUROSEMIDE 40 MG PO TABS: ORAL_TABLET | ORAL | 1 refills | Status: DC

## 2020-06-17 NOTE — Telephone Encounter (Signed)
Refilled lasix

## 2020-06-21 ENCOUNTER — Other Ambulatory Visit: Payer: Self-pay | Admitting: *Deleted

## 2020-06-21 MED ORDER — FUROSEMIDE 40 MG PO TABS: ORAL_TABLET | ORAL | 1 refills | Status: DC

## 2020-06-29 ENCOUNTER — Other Ambulatory Visit (INDEPENDENT_AMBULATORY_CARE_PROVIDER_SITE_OTHER): Payer: Medicare HMO | Admitting: *Deleted

## 2020-06-29 ENCOUNTER — Other Ambulatory Visit: Payer: Self-pay

## 2020-06-29 DIAGNOSIS — Z23 Encounter for immunization: Secondary | ICD-10-CM

## 2020-07-07 ENCOUNTER — Telehealth: Payer: Self-pay

## 2020-07-07 DIAGNOSIS — M545 Low back pain, unspecified: Secondary | ICD-10-CM | POA: Diagnosis not present

## 2020-07-07 DIAGNOSIS — Z96642 Presence of left artificial hip joint: Secondary | ICD-10-CM | POA: Diagnosis not present

## 2020-07-07 NOTE — Telephone Encounter (Signed)
lmom for overdue inr 

## 2020-07-11 ENCOUNTER — Telehealth: Payer: Self-pay | Admitting: *Deleted

## 2020-07-11 ENCOUNTER — Other Ambulatory Visit: Payer: Self-pay | Admitting: Student

## 2020-07-11 DIAGNOSIS — Z96642 Presence of left artificial hip joint: Secondary | ICD-10-CM

## 2020-07-11 NOTE — Telephone Encounter (Signed)
Started on 5mg  Prednisone daily on 07/10/20. Advised pt Prednisone can interact with Warfarin, this is low dose Prednisone 5mg  QD, so less likely to interfere.  Pt has upcoming appt on 07/19/20.  Advised pt to keep appt.  Placed note on upcoming appt about Prednisone start.  Pt states he has been put on low dose Prednisone a couple of times in the past while on Warfarin and it has not interacted.  Pt will monitor for any signs of bleeding and call for sooner appt if increased bleeding or bruising occurs.

## 2020-07-11 NOTE — Telephone Encounter (Signed)
Patient called stating that he started taking Prednisone on 07/10/2020. Patient is wanting to know if this will interfere with his coumdin.

## 2020-07-18 ENCOUNTER — Encounter (HOSPITAL_COMMUNITY)
Admission: RE | Admit: 2020-07-18 | Discharge: 2020-07-18 | Disposition: A | Discharge status: Home / Self Care | Payer: Medicare HMO | Source: Ambulatory Visit | Attending: Student | Admitting: Student

## 2020-07-18 ENCOUNTER — Other Ambulatory Visit: Payer: Self-pay

## 2020-07-18 DIAGNOSIS — Z96642 Presence of left artificial hip joint: Secondary | ICD-10-CM | POA: Insufficient documentation

## 2020-07-18 DIAGNOSIS — M25552 Pain in left hip: Secondary | ICD-10-CM | POA: Diagnosis not present

## 2020-07-18 MED ORDER — TECHNETIUM TC 99M MEDRONATE IV KIT
20.0000 | PACK | Freq: Once | INTRAVENOUS | Status: AC | PRN
  Administered 2020-07-18: 19.16 via INTRAVENOUS

## 2020-07-19 ENCOUNTER — Telehealth: Payer: Self-pay | Admitting: Orthopedic Surgery

## 2020-07-19 ENCOUNTER — Encounter: Payer: Self-pay | Admitting: Family Medicine

## 2020-07-19 ENCOUNTER — Ambulatory Visit (INDEPENDENT_AMBULATORY_CARE_PROVIDER_SITE_OTHER): Payer: Medicare HMO | Admitting: Family Medicine

## 2020-07-19 VITALS — BP 110/64 | HR 64 | Temp 96.7°F | Ht 70.0 in | Wt 220.2 lb

## 2020-07-19 DIAGNOSIS — I10 Essential (primary) hypertension: Secondary | ICD-10-CM

## 2020-07-19 DIAGNOSIS — I482 Chronic atrial fibrillation, unspecified: Secondary | ICD-10-CM

## 2020-07-19 DIAGNOSIS — I5033 Acute on chronic diastolic (congestive) heart failure: Secondary | ICD-10-CM | POA: Diagnosis not present

## 2020-07-19 DIAGNOSIS — Z7901 Long term (current) use of anticoagulants: Secondary | ICD-10-CM | POA: Diagnosis not present

## 2020-07-19 DIAGNOSIS — E785 Hyperlipidemia, unspecified: Secondary | ICD-10-CM

## 2020-07-19 DIAGNOSIS — Z951 Presence of aortocoronary bypass graft: Secondary | ICD-10-CM

## 2020-07-19 NOTE — Telephone Encounter (Signed)
Patient called to inquire about appointment with Dr Aline Brochure for hip pain and what he thinks may be bursitis. Per patient, as per chart notes, patient has been seeing provider at [facility] for this problem, and has history of hip surgery by Dr Wynelle Link, as well as having seen primary care. Relayed it would be a second opinion with Dr Aline Brochure, which would require some additional steps, including records, films, for review - which would not be immediate.  Patient will call back to current providers.

## 2020-07-19 NOTE — Progress Notes (Signed)
Patient ID: Leonard Cisneros, male    DOB: 21-Apr-1941, 79 y.o.   MRN: [id number]   Chief Complaint  Patient presents with  . Annual Exam   Subjective:    HPI AWV- Annual Wellness Visit  The patient was seen for their annual wellness visit. The patient's past medical history, surgical history, and family history were reviewed. Pertinent vaccines were reviewed ( tetanus, pneumonia, shingles, flu) The patient's medication list was reviewed and updated.  The height and weight were entered.  BMI recorded in electronic record elsewhere  Cognitive screening was completed. Outcome of Mini - Cog: passed words but failed clock    Falls /depression screening electronically recorded within record elsewhere  Current tobacco usage: none (All patients who use tobacco were given written and verbal information on quitting)  Recent listing of emergency department/hospitalizations over the past year were reviewed.  current specialist the patient sees on a regular basis: cardiovascular Dr.McDowell; ortho Dr.Lucio    Medicare annual wellness visit patient questionnaire was reviewed.  A written screening schedule for the patient for the next 5-10 years was given. Appropriate discussion of followup regarding next visit was discussed.  Left hip pain has been flaring up. Pt has had 5 hip surgeries. Pt seen [facility]; had bone scan yesterday. Seen PA over at [facility]. -1 month ago.  H/o 5 hip surgeries.  And 1 mo ago had pain in left side hip. Had bone scan done yesterday to evaluate hip further. Had xrays are negative. Has h/o replacement in left hip with revision in 2012. After 2 -3 yrs and then pain again on left side- Went to [facility] for 2nd opinion. Pain resolved.  And gave exercises and doing them daily. Dx with bursitis. Pt has seen Dr. Aline Cisneros in past.  Seen by Dr. Maureen Cisneros in past for back surgery. Using icy hot back.  Missed the clock on the screening for the  medicare wellness exam. Missing things when going to the store at times. Not forgetting where to go with driving and directions. Missing or forgetting when putting something down. Knowing seasons. Used to teach geography.  Seeing Dr. Domenic Cisneros cards. Has h/o Afib, HLD, h/o CABG. Has h/o afib and on coumadin. Seeing them again in 12/21.  Had covid vaccine and the booster.  Has rt foot drop and had this from after having rt hip surgery. Walking with a cane. Has 2 canes. Feeling off balance with his drop foot.  Medical History Leonard Cisneros has a past medical history of Aortic stenosis, Atrial flutter ([place]), CAD (coronary artery disease), Chronic lower back pain, Essential hypertension, Hyperlipidemia, Ischemic cardiomyopathy, Peripheral neuropathy (04/04/2014), and Permanent atrial fibrillation ([place]).   Outpatient Encounter Medications as of 07/19/2020  Medication Sig  . aspirin EC 81 MG tablet Take 81 mg by mouth daily.  [name] atorvastatin (LIPITOR) 80 MG tablet TAKE 1 TABLET BY MOUTH EVERY DAY  . diltiazem (DILT-XR) 240 MG 24 hr capsule TAKE 1 CAPSULE BY MOUTH EVERY DAY  . furosemide (LASIX) 40 MG tablet TAKE ONE DAILY. MAY TAKE AN EXTRA TABLET AS NEEDED FOR EDEMA.  . metoprolol succinate (TOPROL-XL) 100 MG 24 hr tablet TAKE 1 TABLET (100 MG TOTAL) BY MOUTH DAILY. TAKE WITH OR IMMEDIATELY FOLLOWING A MEAL.  . nitroGLYCERIN (NITROSTAT) 0.4 MG SL tablet Place 1 tablet (0.4 mg total) under the tongue every 5 (five) minutes x 3 doses as needed for chest pain (if no relief after 3rd dose, proceed to the ED for an  evaluation or call 911).  . warfarin (COUMADIN) 7.5 MG tablet Take 1/2 to 1 tablet daily as directed by coumadin clinic (Patient taking differently: . Monday and Thursday 1 & 1/2 tab and 1 tab on all other days.)   No facility-administered encounter medications on file as of 07/19/2020.     Review of Systems  Constitutional: Negative for chills and fever.  HENT: Negative for congestion,  rhinorrhea and sore throat.   Respiratory: Negative for cough, shortness of breath and wheezing.   Cardiovascular: Negative for chest pain and leg swelling.  Gastrointestinal: Negative for abdominal pain, diarrhea, nausea and vomiting.  Genitourinary: Negative for dysuria and frequency.  Musculoskeletal: Positive for arthralgias (chronic left hip pain ).  Skin: Negative for rash.  Neurological: Negative for dizziness, weakness and headaches.     Vitals BP 110/64   Pulse 64   Temp (!) 96.7 F (35.9 C)   Ht _0  (1.778 m)   Wt 220 lb 3.2 oz (99.9 kg)   SpO2 96%   BMI 31.60 kg/m   Objective:   Physical Exam Vitals and nursing note reviewed.  Constitutional:      General: He is not in acute distress.    Appearance: Normal appearance. He is not ill-appearing.  HENT:     Head: Normocephalic.     Nose: Nose normal. No congestion.     Mouth/Throat:     Mouth: Mucous membranes are moist.     Pharynx: No oropharyngeal exudate.  Eyes:     Extraocular Movements: Extraocular movements intact.     Conjunctiva/sclera: Conjunctivae normal.     Pupils: Pupils are equal, round, and reactive to light.  Cardiovascular:     Rate and Rhythm: Normal rate and regular rhythm.     Pulses: Normal pulses.     Heart sounds: Normal heart sounds. No murmur heard.   Pulmonary:     Effort: Pulmonary effort is normal.     Breath sounds: Normal breath sounds. No wheezing, rhonchi or rales.  Musculoskeletal:        General: Normal range of motion.     Right lower leg: No edema.     Left lower leg: No edema.     Comments: +walking with a cane, h/o rt foot drop   Skin:    General: Skin is warm and dry.     Findings: No rash.  Neurological:     General: No focal deficit present.     Mental Status: He is alert and oriented to person, place, and time.     Cranial Nerves: No cranial nerve deficit.  Psychiatric:        Mood and Affect: Mood normal.        Behavior: Behavior normal.         Thought Content: Thought content normal.        Judgment: Judgment normal.      Assessment and Plan   1. Chronic atrial fibrillation (HCC) - Lipid panel - CMP14+EGFR - CBC  2. Hyperlipidemia LDL goal <70 - Lipid panel  3. Acute on chronic diastolic CHF (congestive heart failure) ([place])  4. Essential hypertension  5. Hx of CABG  6. Long term (current) use of anticoagulants   Chronic afib/ chf/h/o CABG on long term anticoagulant- Pt f/u cards in 12/21. Pt to take labs over to them to see if they want to add anything to the labs. Cont with inr check on coumadin.  Pt getting all heart meds from cardiology.  HM- covid vaccines- had them 2x and the booster. 3/21-3 weeks later,  2 wks had the covid booster. Had the not sure if was pifzer or moderna.  Medicare wellness- failed memory screening- After reviewing the clock with the pt, he was able to correct it. Not needing to do MMSE yet.  Will monitor and recheck on his next visit if needing to do full MMSE.  F/u 32mo

## 2020-07-20 LAB — LIPID PANEL
Chol/HDL Ratio: 1.4 ratio (ref 0.0–5.0)
Cholesterol, Total: 142 mg/dL (ref 100–199)
HDL: 100 mg/dL (ref >39)
LDL Chol Calc (NIH): 31 mg/dL (ref 0–99)
Triglycerides: 48 mg/dL (ref 0–149)
VLDL Cholesterol Cal: 11 mg/dL (ref 5–40)

## 2020-07-20 LAB — CMP14+EGFR
ALT: 28 IU/L (ref 0–44)
AST: 22 IU/L (ref 0–40)
Albumin/Globulin Ratio: 2.6 — ABNORMAL HIGH (ref 1.2–2.2)
Albumin: 4.7 g/dL (ref 3.7–4.7)
Alkaline Phosphatase: 115 IU/L (ref 44–121)
BUN/Creatinine Ratio: 19 (ref 10–24)
BUN: 16 mg/dL (ref 8–27)
Bilirubin Total: 1.1 mg/dL (ref 0.0–1.2)
CO2: 25 mmol/L (ref 20–29)
Calcium: 9.7 mg/dL (ref 8.6–10.2)
Chloride: 103 mmol/L (ref 96–106)
Creatinine, Ser: 0.83 mg/dL (ref 0.76–1.27)
GFR calc Af Amer: 97 mL/min/{1.73_m2} (ref >59)
GFR calc non Af Amer: 84 mL/min/{1.73_m2} (ref >59)
Globulin, Total: 1.8 g/dL (ref 1.5–4.5)
Glucose: 98 mg/dL (ref 65–99)
Potassium: 4.9 mmol/L (ref 3.5–5.2)
Sodium: 141 mmol/L (ref 134–144)
Total Protein: 6.5 g/dL (ref 6.0–8.5)

## 2020-07-20 LAB — CBC
Hematocrit: 35.7 % — ABNORMAL LOW (ref 37.5–51.0)
Hemoglobin: 12.4 g/dL — ABNORMAL LOW (ref 13.0–17.7)
MCH: 35.5 pg — ABNORMAL HIGH (ref 26.6–33.0)
MCHC: 34.7 g/dL (ref 31.5–35.7)
MCV: 102 fL — ABNORMAL HIGH (ref 79–97)
Platelets: 262 10*3/uL (ref 150–450)
RBC: 3.49 x10E6/uL — ABNORMAL LOW (ref 4.14–5.80)
RDW: 13 % (ref 11.6–15.4)
WBC: 9.9 10*3/uL (ref 3.4–10.8)

## 2020-07-22 ENCOUNTER — Encounter: Payer: Self-pay | Admitting: Cardiology

## 2020-07-22 NOTE — Progress Notes (Signed)
Cardiology Office Note  Date: 07/25/2020   ID: Leonard Cisneros, DOB 05/30/1941, MRN [id number]  PCP:  Erven Colla, DO  Cardiologist:  Rozann Lesches, MD Electrophysiologist:  None   Chief Complaint  Patient presents with  . Cardiac follow-up    History of Present Illness: Leonard Cisneros is a 79 y.o. male former patient of Dr. Bronson Ing now presenting to establish follow-up with me.  He was last seen in August by Mr. [name] NP.  I reviewed extensive records and updated the chart.  He presents for a routine visit.  Reports no progressive angina symptoms or nitroglycerin use.  He uses 2 canes to ambulate, has history of chronic bilateral hip pain with replacements on both sides.  Recently having trouble with bursitis on the left and has follow-up with orthopedics.  Echocardiogram from July of this year revealed LVEF 50 to 55% with moderate septal hypertrophy, severe left atrial enlargement with mild to moderate mitral regurgitation, and moderate aortic stenosis with functionally bicuspid valve.  He is on Coumadin for stroke prophylaxis with atrial fibrillation, CHA2DS2-VASc score is at least 3. No reported bleeding problems.  I reviewed his recent lab work as noted below.  LDL 31.  LFTs normal.  Past Medical History:  Diagnosis Date  . Aortic stenosis   . Atrial flutter ([place])    a. s/p remote ablation by Dr. [name].  [name] CAD (coronary artery disease)    a. s/p CABG 2003. b. 01/2016: unstable angina - s/p BMS to SVG-ramus intermediate, patent LIMA-mLAD, patent RIMA-dRCA.   [name] Chronic lower back pain   . Essential hypertension   . Hyperlipidemia   . Ischemic cardiomyopathy    a. Cath 01/2016 -  EF 50-55% with mild distal inferior hypocontractility.  . Peripheral neuropathy 04/04/2014  . Permanent atrial fibrillation [facility])     Past Surgical History:  Procedure Laterality Date  . BACK SURGERY    . CARDIAC CATHETERIZATION  ~ 2000; 2003  . CARDIAC CATHETERIZATION N/A  02/01/2016   Procedure: Left Heart Cath and Cors/Grafts Angiography;  Surgeon: [name], MD;  Location: [place] [facility];  Service: Cardiovascular;  Laterality: N/A;  . CARDIAC CATHETERIZATION N/A 02/01/2016   Procedure: Coronary Stent Intervention;  Surgeon: [name], MD;  Location: [place] [facility];  Service: Cardiovascular;  Laterality: N/A;  . CATARACT EXTRACTION W/PHACO Left 02/09/2019   Procedure: CATARACT EXTRACTION PHACO AND INTRAOCULAR LENS PLACEMENT (IOC);  Surgeon: [name], MD;  Location: [facility];  Service: Ophthalmology;  Laterality: Left;  CDE: 27.70  . CATARACT EXTRACTION W/PHACO Right 02/23/2019   Procedure: CATARACT EXTRACTION PHACO AND INTRAOCULAR LENS PLACEMENT RIGHT EYE;  Surgeon: [name], MD;  Location: [facility];  Service: Ophthalmology;  Laterality: Right;  CDE: 10.15  . COLONOSCOPY  06/04/2002   Normal colon and rectum  . COLONOSCOPY  07/08/2012   Procedure: COLONOSCOPY;  Surgeon: [name], MD;  Location: [facility];  Service: Endoscopy;  Laterality: N/A;  11:30  . COLONOSCOPY N/A 10/02/2017   Procedure: COLONOSCOPY;  Surgeon: [name], MD;  Location: [facility];  Service: Endoscopy;  Laterality: N/A;  10:30am  . CORONARY ANGIOPLASTY WITH STENT PLACEMENT  02/01/2016  . CORONARY ARTERY BYPASS GRAFT  2003   LIMA to LAD,LIMA to distal RCA,SVG to ramus intermediate vessel.  . FEMORAL REVISION Right 03/30/2019   Procedure: Femoral revision right total hip arthroplasty;  Surgeon: [name], MD;  Location: [facility];  Service: Orthopedics;  Laterality: Right;  144min  . FOREIGN BODY REMOVAL Right 04/02/2014   Procedure: FOREIGN BODY REMOVAL RIGHT FOOT;  Surgeon: [name], MD;  Location: [facility];  Service: General;  Laterality: Right;  . JOINT REPLACEMENT    . JOINT REPLACEMENT    . LUMBAR DISC SURGERY    . REVISION TOTAL HIP ARTHROPLASTY Bilateral 2005-2012   right-left  . SHOULDER OPEN ROTATOR CUFF REPAIR Right   . TOTAL HIP  ARTHROPLASTY Right 1999  . TOTAL HIP ARTHROPLASTY Left 2008  . 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    Current Outpatient Medications  Medication Sig Dispense Refill  . aspirin EC 81 MG tablet Take 81 mg by mouth daily.    [name] atorvastatin (LIPITOR) 80 MG tablet TAKE 1 TABLET BY MOUTH EVERY DAY 30 tablet 6  . diltiazem (DILT-XR) 240 MG 24 hr capsule TAKE 1 CAPSULE BY MOUTH EVERY DAY 90 capsule 1  . furosemide (LASIX) 40 MG tablet TAKE ONE DAILY. MAY TAKE AN EXTRA TABLET AS NEEDED FOR EDEMA. 180 tablet 1  . metoprolol succinate (TOPROL-XL) 100 MG 24 hr tablet TAKE 1 TABLET (100 MG TOTAL) BY MOUTH DAILY. TAKE WITH OR IMMEDIATELY FOLLOWING A MEAL. 90 tablet 1  . nitroGLYCERIN (NITROSTAT) 0.4 MG SL tablet Place 1 tablet (0.4 mg total) under the tongue every 5 (five) minutes x 3 doses as needed for chest pain (if no relief after 3rd dose, proceed to the ED for an evaluation or call 911). 25 tablet 3  . warfarin (COUMADIN) 7.5 MG tablet Take 1/2 to 1 tablet daily as directed by coumadin clinic (Patient taking differently: . Monday and Thursday 1 & 1/2 tab and 1 tab on all other days.) 90 tablet 1   No current facility-administered medications for this visit.   Allergies:  Augmentin [amoxicillin-pot clavulanate]   ROS: No palpitations or syncope.  Physical Exam: VS:  BP 130/62   Pulse 96   Ht 5\' 11"  (1.803 m)   Wt 224 lb (101.6 kg)   SpO2 96%   BMI 31.24 kg/m , BMI Body mass index is 31.24 kg/m.  Wt Readings from Last 3 Encounters:  07/25/20 224 lb (101.6 kg)  07/19/20 220 lb 3.2 oz (99.9 kg)  04/21/20 217 lb 12.8 oz (98.8 kg)    General: Patient appears comfortable at rest. HEENT: Conjunctiva and lids normal, wearing a mask. Neck: Supple, no elevated JVP or carotid bruits, no thyromegaly. Lungs:  Clear to auscultation, nonlabored breathing at rest. Cardiac: Irregularly irregular, no S3, 2/6 systolic murmur. Abdomen: Soft, nontender, bowel sounds present. Extremities: No pitting edema, distal pulses 2+.  ECG:  An ECG dated 04/07/2020 was personally reviewed today and demonstrated:  Rate controlled atrial fibrillation.  Recent Labwork: 07/19/2020: ALT 28; AST 22; BUN 16; Creatinine, Ser 0.83; Hemoglobin 12.4; Platelets 262; Potassium 4.9; Sodium 141     Component Value Date/Time   CHOL 142 07/19/2020 1036   TRIG 48 07/19/2020 1036   HDL 100 07/19/2020 1036   CHOLHDL 1.4 07/19/2020 1036   CHOLHDL 1.7 10/23/2018 0050   VLDL 11 10/23/2018 0050   LDLCALC 31 07/19/2020 1036    Other Studies Reviewed Today:  [facility] Myoview 10/23/2018:  There was no ST segment deviation noted during stress.  The study is normal. There are no perfusion defects  This is a low risk  study.  The left ventricular ejection fraction is normal (55-65%).  Echocardiogram 03/22/2020: 1. Left ventricular ejection fraction, by estimation, is 50 to 55%. The  left ventricle has low normal function. The left ventricle demonstrates  regional wall motion abnormalities (see scoring diagram/findings for  description). There is moderate  asymmetric left ventricular hypertrophy of the septal segment. Left  ventricular diastolic parameters are indeterminate.  2. Right ventricular systolic function is normal. The right ventricular  size is normal. There is normal pulmonary artery systolic pressure.  3. Left atrial size was severely dilated.  4. Right atrial size was mildly dilated.  5. The mitral valve is abnormal, moderate annular calcification. Mild to  moderate mitral valve regurgitation.  6. 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  Aortic valve regurgitation is not visualized. Moderate aortic valve  stenosis. Aortic valve area, by VTI measures 1.49 cm. Aortic valve mean    gradient measures 15.5 mmHg. Dimentionless  index 0.39.  7. The inferior vena cava is normal in size with greater than 50%  respiratory variability, suggesting right atrial pressure of 3 mmHg.   Assessment and Plan:  1.  Permanent atrial fibrillation. CHA2DS2-VASc score is at least 3.  He reports no palpitations and heart rate is adequately controlled on Toprol-XL and diltiazem CD.  Continue Coumadin for stroke prophylaxis with follow-up in anticoagulation clinic.  2.  Mixed hyperlipidemia, on Lipitor with recent LDL 31.  3.  Multivessel CAD status post CABG in 2003 with subsequent BMS to the SVG to ramus intermedius in 2017.  He reports no active angina. Continue medical therapy and observation.  He is on aspirin, Lipitor, Toprol-XL, and diltiazem CD.  4.  Valvular heart disease including functionally bicuspid aortic valve with moderate stenosis and mild to moderate mitral regurgitation.  This will be reevaluated by echocardiogram in July of next year.  Medication Adjustments/Labs and Tests Ordered: Current medicines are reviewed at length with the patient today.  Concerns regarding medicines are outlined above.   Tests Ordered: Orders Placed This Encounter  Procedures  . ECHOCARDIOGRAM COMPLETE    Medication Changes: No orders of the defined types were placed in this encounter.   Disposition:  Follow up after echocardiogram next July.  Signed, Satira Sark, MD, [facility] 07/25/2020 10:05 AM    [facility] [address], [place], [place] [address] Phone: [phone]; Fax: [phone]

## 2020-07-25 ENCOUNTER — Ambulatory Visit (INDEPENDENT_AMBULATORY_CARE_PROVIDER_SITE_OTHER): Payer: Medicare HMO | Admitting: Cardiology

## 2020-07-25 ENCOUNTER — Ambulatory Visit (INDEPENDENT_AMBULATORY_CARE_PROVIDER_SITE_OTHER): Payer: Medicare HMO | Admitting: *Deleted

## 2020-07-25 ENCOUNTER — Encounter: Payer: Self-pay | Admitting: Cardiology

## 2020-07-25 VITALS — BP 130/62 | HR 96 | Ht 71.0 in | Wt 224.0 lb

## 2020-07-25 DIAGNOSIS — I25119 Atherosclerotic heart disease of native coronary artery with unspecified angina pectoris: Secondary | ICD-10-CM

## 2020-07-25 DIAGNOSIS — I482 Chronic atrial fibrillation, unspecified: Secondary | ICD-10-CM

## 2020-07-25 DIAGNOSIS — I4821 Permanent atrial fibrillation: Secondary | ICD-10-CM

## 2020-07-25 DIAGNOSIS — Z7901 Long term (current) use of anticoagulants: Secondary | ICD-10-CM

## 2020-07-25 DIAGNOSIS — I350 Nonrheumatic aortic (valve) stenosis: Secondary | ICD-10-CM | POA: Diagnosis not present

## 2020-07-25 LAB — POCT INR: INR: 3.4 — AB (ref 2.0–3.0)

## 2020-07-25 NOTE — Patient Instructions (Signed)
Hold warfarin tomorrow then resume 7.5mg  daily.  Recheck in 3 weeks.

## 2020-07-25 NOTE — Patient Instructions (Signed)
Medication Instructions:   Your physician recommends that you continue on your current medications as directed. Please refer to the Current Medication list given to you today.  Labwork:  None  Testing/Procedures: Your physician has requested that you have an echocardiogram I July 2022. Echocardiography is a painless test that uses sound waves to create images of your heart. It provides your doctor with information about the size and shape of your heart and how well your heart's chambers and valves are working. This procedure takes approximately one hour. There are no restrictions for this procedure.  Follow-Up:  Your physician recommends that you schedule a follow-up appointment in: July 2022.  Any Other Special Instructions Will Be Listed Below (If Applicable).  If you need a refill on your cardiac medications before your next appointment, please call your pharmacy.

## 2020-08-03 DIAGNOSIS — M25552 Pain in left hip: Secondary | ICD-10-CM | POA: Insufficient documentation

## 2020-08-03 DIAGNOSIS — M5136 Other intervertebral disc degeneration, lumbar region: Secondary | ICD-10-CM | POA: Diagnosis not present

## 2020-08-17 ENCOUNTER — Ambulatory Visit (INDEPENDENT_AMBULATORY_CARE_PROVIDER_SITE_OTHER): Payer: Medicare HMO | Admitting: *Deleted

## 2020-08-17 DIAGNOSIS — I482 Chronic atrial fibrillation, unspecified: Secondary | ICD-10-CM | POA: Diagnosis not present

## 2020-08-17 DIAGNOSIS — Z7901 Long term (current) use of anticoagulants: Secondary | ICD-10-CM | POA: Diagnosis not present

## 2020-08-17 DIAGNOSIS — Z5181 Encounter for therapeutic drug level monitoring: Secondary | ICD-10-CM | POA: Diagnosis not present

## 2020-08-17 LAB — POCT INR: INR: 2.2 (ref 2.0–3.0)

## 2020-08-17 NOTE — Patient Instructions (Signed)
Continue warfarin 7.5mg  daily.  Recheck in 4 weeks.

## 2020-08-18 ENCOUNTER — Ambulatory Visit: Payer: Medicare HMO | Admitting: Cardiovascular Disease

## 2020-08-31 DIAGNOSIS — M545 Low back pain, unspecified: Secondary | ICD-10-CM | POA: Insufficient documentation

## 2020-09-07 ENCOUNTER — Other Ambulatory Visit: Payer: Self-pay | Admitting: *Deleted

## 2020-09-07 MED ORDER — WARFARIN SODIUM 7.5 MG PO TABS: ORAL_TABLET | ORAL | 1 refills | Status: DC

## 2020-09-08 ENCOUNTER — Encounter: Payer: Self-pay | Admitting: Family Medicine

## 2020-09-08 ENCOUNTER — Other Ambulatory Visit: Payer: Self-pay

## 2020-09-08 ENCOUNTER — Ambulatory Visit (INDEPENDENT_AMBULATORY_CARE_PROVIDER_SITE_OTHER): Payer: Medicare HMO | Admitting: Family Medicine

## 2020-09-08 VITALS — BP 140/89 | HR 76 | Ht 70.0 in | Wt 224.0 lb

## 2020-09-08 DIAGNOSIS — M5416 Radiculopathy, lumbar region: Secondary | ICD-10-CM | POA: Diagnosis not present

## 2020-09-08 DIAGNOSIS — M25552 Pain in left hip: Secondary | ICD-10-CM | POA: Diagnosis not present

## 2020-09-08 DIAGNOSIS — M48061 Spinal stenosis, lumbar region without neurogenic claudication: Secondary | ICD-10-CM | POA: Diagnosis not present

## 2020-09-08 MED ORDER — HYDROCODONE-ACETAMINOPHEN 5-325 MG PO TABS: 1.0000 | ORAL_TABLET | Freq: Three times a day (TID) | ORAL | 0 refills | Status: DC | PRN

## 2020-09-08 MED ORDER — METHYLPREDNISOLONE ACETATE 40 MG/ML IJ SUSP
40.0000 mg | Freq: Once | INTRAMUSCULAR | Status: AC
  Administered 2020-09-08: 40 mg via INTRAMUSCULAR

## 2020-09-08 NOTE — Progress Notes (Signed)
Patient ID: Leonard Cisneros, male    DOB: 19-Dec-1940, 80 y.o.   MRN: [id number]   Chief Complaint  Patient presents with  . Consult    Left hip pain- needs referral- has seen orthos and had injections and noting helping- cant find source of pain   Subjective:    HPI  CC- left hip pain  Pt having pain in left lateral calf and left hip Chronic issue and has had history of lumbar stenosis in past.  Seen by Dr. Judie Bonus at [facility] and given injection for bursitis in left hip.  Has had bilateral hip replacement.  Has had imaging of the hips and was told that the pain isn't coming from the hip.  Oct '21- had pain again and had similar pain 8-9 yrs ago. Saw Dr. Caryl Comes at [facility]. And told him has bursitis. Gave injection and it improved.  Went to call Dr. Romeo Apple, Ortho but they wouldn't see him due to seeing Dr. Despina Hick for the same concern. 2nd injection to the hip didn't help, last month. No pain with laying flat with sleeping. No pain with sitting.  Pain with walking. Using 2 canes.  Hurting pain on left lateral calf pain. Seeing 1/17/22[facility] neuro surgery. Pt stating getting an injection from them soon.  Pt has prednisone and since pt on coumadin they said not to take it. Didn't help with lower dose of prednisone,which he has tried in the past.  Reviewed bone scan and negative on left hip. Normal left hip arthroscopy.  MRI lumbar-in 2/21- reviewed with foraminal and spinal stenosis on L3-L5.  Multilevel degenerative changes.   No long car rides or plane rides recently. Pt on coumadin for afib.   Medical History Ke[place]on has a past medical history of Aortic stenosis, Atrial flutter (HCC), CAD (coronary artery disease), Chronic lower back pain, Essential hypertension, Hyperlipidemia, Ischemic cardiomyopathy, Peripheral neuropathy (04/04/2014), and Permanent atrial fibrillation (HCC).   Outpatient Encounter Medications as of 09/08/2020  Medication  Sig  . HYDROcodone-acetaminophen (NORCO) 5-325 MG tablet Take 1 tablet by mouth 3 (three) times daily as needed for moderate pain.  [name] aspirin EC 81 MG tablet Take 81 mg by mouth daily.  [name] atorvastatin (LIPITOR) 80 MG tablet TAKE 1 TABLET BY MOUTH EVERY DAY  . diltiazem (DILT-XR) 240 MG 24 hr capsule TAKE 1 CAPSULE BY MOUTH EVERY DAY  . furosemide (LASIX) 40 MG tablet TAKE ONE DAILY. MAY TAKE AN EXTRA TABLET AS NEEDED FOR EDEMA.  . metoprolol succinate (TOPROL-XL) 100 MG 24 hr tablet TAKE 1 TABLET (100 MG TOTAL) BY MOUTH DAILY. TAKE WITH OR IMMEDIATELY FOLLOWING A MEAL.  . nitroGLYCERIN (NITROSTAT) 0.4 MG SL tablet Place 1 tablet (0.4 mg total) under the tongue every 5 (five) minutes x 3 doses as needed for chest pain (if no relief after 3rd dose, proceed to the ED for an evaluation or call 911).  . warfarin (COUMADIN) 7.5 MG tablet Take 1/2 to 1 tablet daily as directed by coumadin clinic  . [EXPIRED] methylPREDNISolone acetate (DEPO-MEDROL) injection 40 mg    No facility-administered encounter medications on file as of 09/08/2020.     Review of Systems  Constitutional: Negative for chills and fever.  HENT: Negative for congestion, rhinorrhea and sore throat.   Respiratory: Negative for cough, shortness of breath and wheezing.   Cardiovascular: Negative for chest pain and leg swelling.  Gastrointestinal: Negative for abdominal pain, diarrhea, nausea and vomiting.  Genitourinary: Negative for difficulty urinating, dysuria,  flank pain, frequency and hematuria.  Musculoskeletal: Positive for arthralgias (left hip, left lateral calf pain) and back pain.  Skin: Negative for rash.  Neurological: Negative for dizziness, weakness and headaches.     Vitals BP 140/89   Pulse 76   Ht 5\' 10"  (1.778 m)   Wt 224 lb (101.6 kg)   SpO2 96%   BMI 32.14 kg/m   Objective:   Physical Exam Vitals and nursing note reviewed.  Constitutional:      General: He is in acute distress (due to pain).      Appearance: Normal appearance.  Pulmonary:     Effort: No respiratory distress.  Musculoskeletal:        General: No swelling, deformity or signs of injury.     Comments: +dec rom with flexion of lumbar spine.  No ttp over spinous process.  Dec rom with left hip/leg due to pain. Neg homan's sign bilaterally.  Bilateral venous stasis changes bilaterally. Mild calf ttp on the left lateral calf.  No swelling or erythema.  Skin:    General: Skin is warm and dry.     Findings: No rash.  Neurological:     General: No focal deficit present.     Mental Status: He is alert and oriented to person, place, and time.     Cranial Nerves: No cranial nerve deficit.     Gait: Gait abnormal (using 2 canes).  Psychiatric:        Mood and Affect: Mood normal.        Behavior: Behavior normal.        Thought Content: Thought content normal.        Judgment: Judgment normal.     Assessment and Plan   1. Lumbar radiculopathy - HYDROcodone-acetaminophen (NORCO) 5-325 MG tablet; Take 1 tablet by mouth 3 (three) times daily as needed for moderate pain.  Dispense: 30 tablet; Refill: 0 - methylPREDNISolone acetate (DEPO-MEDROL) injection 40 mg  2. Foraminal stenosis of lumbar region - HYDROcodone-acetaminophen (NORCO) 5-325 MG tablet; Take 1 tablet by mouth 3 (three) times daily as needed for moderate pain.  Dispense: 30 tablet; Refill: 0 - methylPREDNISolone acetate (DEPO-MEDROL) injection 40 mg  3. Left hip pain - methylPREDNISolone acetate (DEPO-MEDROL) injection 40 mg   Likely lumbar radiculopathy causing the pain down the left leg.  Upon review of the MRI lumbar region, showing h/o lumbar stenosis.  Call if having any inc in bleeding.  Dark black or bloody stools or blood in urine.  Pt to f/u with ortho spine for injection and for pain management for lumbar radiculopathy and spinal stenosis.;  Gave injection of depo-medrol 40mg . Gave small course of norco for severe pain.  Pt in  agreement.  F/u prn.

## 2020-09-14 ENCOUNTER — Ambulatory Visit (INDEPENDENT_AMBULATORY_CARE_PROVIDER_SITE_OTHER): Payer: Medicare HMO | Admitting: *Deleted

## 2020-09-14 DIAGNOSIS — I482 Chronic atrial fibrillation, unspecified: Secondary | ICD-10-CM

## 2020-09-14 DIAGNOSIS — Z7901 Long term (current) use of anticoagulants: Secondary | ICD-10-CM

## 2020-09-14 DIAGNOSIS — Z5181 Encounter for therapeutic drug level monitoring: Secondary | ICD-10-CM

## 2020-09-14 LAB — POCT INR: INR: 2.1 (ref 2.0–3.0)

## 2020-09-14 NOTE — Patient Instructions (Signed)
Continue warfarin 7.5mg  daily.  Recheck in 6 weeks.

## 2020-09-24 ENCOUNTER — Telehealth: Payer: Self-pay | Admitting: Family Medicine

## 2020-09-26 NOTE — Telephone Encounter (Signed)
Ok thanks!  Dr. T ° °

## 2020-09-26 NOTE — Telephone Encounter (Signed)
Called pt and he states he Has appt for tomorrow at [facility] with Dr. Jerene Pitch

## 2020-09-27 DIAGNOSIS — M5459 Other low back pain: Secondary | ICD-10-CM | POA: Diagnosis not present

## 2020-09-30 DIAGNOSIS — M25552 Pain in left hip: Secondary | ICD-10-CM | POA: Diagnosis not present

## 2020-10-09 ENCOUNTER — Other Ambulatory Visit: Payer: Self-pay | Admitting: Family Medicine

## 2020-10-10 DIAGNOSIS — R69 Illness, unspecified: Secondary | ICD-10-CM | POA: Diagnosis not present

## 2020-10-26 ENCOUNTER — Ambulatory Visit (INDEPENDENT_AMBULATORY_CARE_PROVIDER_SITE_OTHER): Payer: Medicare HMO | Admitting: *Deleted

## 2020-10-26 DIAGNOSIS — Z7901 Long term (current) use of anticoagulants: Secondary | ICD-10-CM | POA: Diagnosis not present

## 2020-10-26 DIAGNOSIS — Z5181 Encounter for therapeutic drug level monitoring: Secondary | ICD-10-CM

## 2020-10-26 DIAGNOSIS — I482 Chronic atrial fibrillation, unspecified: Secondary | ICD-10-CM

## 2020-10-26 DIAGNOSIS — I4891 Unspecified atrial fibrillation: Secondary | ICD-10-CM | POA: Diagnosis not present

## 2020-10-26 LAB — POCT INR: INR: 3.7 — AB (ref 2.0–3.0)

## 2020-10-26 NOTE — Patient Instructions (Signed)
Has been taking Percocet 3 time a day.  Now down to 1 tablet daily Hold warfarin tomorrow then resume 1 tablet daily.  Recheck in 3 weeks.

## 2020-11-07 ENCOUNTER — Telehealth: Payer: Self-pay | Admitting: Cardiology

## 2020-11-07 DIAGNOSIS — I482 Chronic atrial fibrillation, unspecified: Secondary | ICD-10-CM | POA: Diagnosis not present

## 2020-11-07 DIAGNOSIS — Z79891 Long term (current) use of opiate analgesic: Secondary | ICD-10-CM | POA: Diagnosis not present

## 2020-11-07 DIAGNOSIS — G894 Chronic pain syndrome: Secondary | ICD-10-CM | POA: Diagnosis not present

## 2020-11-07 NOTE — Telephone Encounter (Signed)
Pt voiced understanding and would like someone to look at his leg - scheduled pt with Katina Dung, NP for Thursday 3/10

## 2020-11-07 NOTE — Telephone Encounter (Signed)
Patient called stating that he is having swelling to his right calf area. States that it is red. C/O Shortness of breath. States that he increased his Lasix intake. Swelling has gone down some. [phone].

## 2020-11-07 NOTE — Telephone Encounter (Signed)
Thank you for the update.  I reviewed the most recent note.  Since he is on Coumadin it is unlikely that this represents a DVT.  Agree with his recent increase in Lasix, particularly if this led to improvement.  Would continue to observe for now.

## 2020-11-07 NOTE — Telephone Encounter (Signed)
Pt c/o swelling in right calf with redness - denies any tenderness or pain in calf says he hasn't injured his leg or calf - denies any SOB/weight gain - took 80 mg of lasix Friday and Sunday and this seems to have helped and not as red or swollen today

## 2020-11-09 NOTE — Progress Notes (Signed)
Cardiology Office Note  Date: 11/10/2020   ID: Leonard Cisneros, DOB Apr 15, 1941, MRN [id number]  PCP:  Erven Colla, DO  Cardiologist:  Rozann Lesches, MD Electrophysiologist:  None   Chief Complaint: Swelling in leg  History of Present Illness: Leonard Cisneros is a 80 y.o. male with a history of CAD, aortic stenosis, permanent atrial fibrillation, HTN, HLD, ischemic cardiomyopathy.  Last seen by Dr. Domenic Polite 07/25/2020.  Voiced no progressive anginal symptoms or nitroglycerin use.  Echocardiogram from July 2021 LVEF 50 to 55% with moderate septal hypertrophy.  Severe atrial enlargement with mild to moderate mitral regurgitation.  Moderate aortic stenosis with functionally bicuspid valve.  On Coumadin therapy for stroke prophylaxis's with atrial fibrillation CHA2DS2-VASc score of 3.  No bleeding problems.  No palpitations with good heart rate control on Toprol and diltiazem CD.  Continuing Coumadin for stroke prophylaxis and following up with anticoagulation clinic.  Continuing Lipitor therapy with LDL 31 recently.  Multi vessel-CAD status post CABG 2003: BMS to SVG to ramus intermedius 2017.  No active angina.  Continue aspirin, Lipitor, Toprol-XL and diltiazem CD.  Plans for a follow-up echocardiogram July 2020 for a functionally bicuspid aortic valve with moderate stenosis and mild to moderate MR.  He is here today with complaints of right leg swelling below the knee relative to the left leg.  He does have evidence of venous disease.  He states he has been taking Lasix which has decreased the swelling in the right leg but continues to have swelling.  He denies any dyspnea, tachycardia or pleuritic chest pain.  He is currently on Coumadin and aspirin for atrial fibrillation.  He denies any dyspnea on exertion, orthostatic symptoms, CVA or TIA-like symptoms, lightheadedness, dizziness, near syncopal or syncopal episodes.  Denies any bleeding.  Past Medical History:  Diagnosis  Date  . Aortic stenosis   . Atrial flutter ([place])    a. s/p remote ablation by Dr. [name].  [name] CAD (coronary artery disease)    a. s/p CABG 2003. b. 01/2016: unstable angina - s/p BMS to SVG-ramus intermediate, patent LIMA-mLAD, patent RIMA-dRCA.   [name] Chronic lower back pain   . Essential hypertension   . Hyperlipidemia   . Ischemic cardiomyopathy    a. Cath 01/2016 -  EF 50-55% with mild distal inferior hypocontractility.  . Peripheral neuropathy 04/04/2014  . Permanent atrial fibrillation [facility])     Past Surgical History:  Procedure Laterality Date  . BACK SURGERY    . CARDIAC CATHETERIZATION  ~ 2000; 2003  . CARDIAC CATHETERIZATION N/A 02/01/2016   Procedure: Left Heart Cath and Cors/Grafts Angiography;  Surgeon: [name], MD;  Location: [place] [facility];  Service: Cardiovascular;  Laterality: N/A;  . CARDIAC CATHETERIZATION N/A 02/01/2016   Procedure: Coronary Stent Intervention;  Surgeon: [name], MD;  Location: [place] [facility];  Service: Cardiovascular;  Laterality: N/A;  . CATARACT EXTRACTION W/PHACO Left 02/09/2019   Procedure: CATARACT EXTRACTION PHACO AND INTRAOCULAR LENS PLACEMENT (IOC);  Surgeon: [name], MD;  Location: [facility];  Service: Ophthalmology;  Laterality: Left;  CDE: 27.70  . CATARACT EXTRACTION W/PHACO Right 02/23/2019   Procedure: CATARACT EXTRACTION PHACO AND INTRAOCULAR LENS PLACEMENT RIGHT EYE;  Surgeon: [name], MD;  Location: [facility];  Service: Ophthalmology;  Laterality: Right;  CDE: 10.15  . COLONOSCOPY  06/04/2002   Normal colon and rectum  . COLONOSCOPY  07/08/2012   Procedure: COLONOSCOPY;  Surgeon: [name], MD;  Location: [facility]  END[facility];  Service: Endoscopy;  Laterality: N/A;  11:30  . COLONOSCOPY N/A 10/02/2017   Procedure: COLONOSCOPY;  Surgeon: [name], MD;  Location: [facility];  Service: Endoscopy;  Laterality: N/A;  10:30am  . CORONARY ANGIOPLASTY WITH STENT PLACEMENT  02/01/2016  . CORONARY ARTERY BYPASS  GRAFT  2003   LIMA to LAD,LIMA to distal RCA,SVG to ramus intermediate vessel.  . FEMORAL REVISION Right 03/30/2019   Procedure: Femoral revision right total hip arthroplasty;  Surgeon: [name], MD;  Location: [facility];  Service: Orthopedics;  Laterality: Right;  190min  . FOREIGN BODY REMOVAL Right 04/02/2014   Procedure: FOREIGN BODY REMOVAL RIGHT FOOT;  Surgeon: [name], MD;  Location: [facility];  Service: General;  Laterality: Right;  . JOINT REPLACEMENT    . JOINT REPLACEMENT    . LUMBAR DISC SURGERY    . REVISION TOTAL HIP ARTHROPLASTY Bilateral 2005-2012   right-left  . SHOULDER OPEN ROTATOR CUFF REPAIR Right   . TOTAL HIP ARTHROPLASTY Right 1999  . TOTAL HIP ARTHROPLASTY Left 2008  . 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    Current Outpatient Medications  Medication Sig Dispense Refill  . aspirin EC 81 MG tablet Take 81 mg by mouth daily.    [name] atorvastatin (LIPITOR) 80 MG tablet TAKE 1 TABLET BY MOUTH EVERY DAY 30 tablet 6  . diltiazem (DILT-XR) 240 MG 24 hr capsule TAKE 1 CAPSULE BY MOUTH EVERY DAY 90 capsule 1  . furosemide (LASIX) 40 MG tablet TAKE ONE DAILY. MAY TAKE AN EXTRA TABLET AS NEEDED FOR EDEMA. 180 tablet 1  . HYDROcodone-acetaminophen (NORCO) 5-325 MG tablet Take 1 tablet by mouth 3 (three) times daily as needed for moderate pain. 30 tablet 0  . metoprolol succinate (TOPROL-XL) 100 MG 24 hr tablet TAKE 1 TABLET (100 MG TOTAL) BY MOUTH DAILY. TAKE WITH OR IMMEDIATELY FOLLOWING A MEAL. 90 tablet 1  . nitroGLYCERIN (NITROSTAT) 0.4 MG SL tablet Place 1 tablet (0.4 mg total) under the tongue every 5 (five) minutes x 3 doses as needed for chest pain (if no relief after 3rd dose, proceed to the ED for an evaluation or call 911). 25 tablet 3  . warfarin (COUMADIN) 7.5 MG tablet  Take 1/2 to 1 tablet daily as directed by coumadin clinic 90 tablet 1   No current facility-administered medications for this visit.   Allergies:  Augmentin [amoxicillin-pot clavulanate]   Social History: The patient  reports that he quit smoking about 54 years ago. His smoking use included cigarettes. He has a 1.00 pack-year smoking history. He has never used smokeless tobacco. He reports current alcohol use of about 11.0 standard drinks of alcohol per week. He reports that he does not use drugs.   Family History: The patient's family history includes Heart attack in his brother.   ROS:  Please see the history of present illness. Otherwise, complete review of systems is positive for none.  All other systems are reviewed and negative.   Physical Exam: VS:  BP 130/60   Pulse 74   Ht 5\' 10"  (1.778 m)   Wt 234 lb 12.8 oz (106.5 kg)   SpO2 98%   BMI 33.69 kg/m , BMI Body mass index is 33.69 kg/m.  Wt Readings from Last 3 Encounters:  11/10/20 234 lb 12.8 oz (106.5 kg)  09/08/20 224 lb (101.6 kg)  07/25/20 224 lb (101.6 kg)    General: Patient appears comfortable at rest. HEENT: Conjunctiva and lids normal, oropharynx clear with moist mucosa. Neck: Supple, no elevated JVP or carotid bruits, no thyromegaly. Lungs: Clear to auscultation, nonlabored breathing at rest. Cardiac: Regular rate and rhythm, no S3 or significant systolic murmur, no pericardial rub. Abdomen: Soft, nontender, no hepatomegaly, bowel sounds present, no guarding or rebound. Extremities: No pitting edema, distal pulses 2+. Skin: Warm and dry. Musculoskeletal: No kyphosis. Neuropsychiatric: Alert and oriented x3, affect grossly appropriate.  ECG:    Recent Labwork: 07/19/2020: ALT 28; AST 22; BUN 16; Creatinine, Ser 0.83; Hemoglobin 12.4; Platelets 262; Potassium 4.9; Sodium 141     Component Value Date/Time   CHOL 142 07/19/2020 1036   TRIG 48 07/19/2020 1036   HDL 100 07/19/2020 1036   CHOLHDL 1.4  07/19/2020 1036   CHOLHDL 1.7 10/23/2018 0050   VLDL 11 10/23/2018 0050   LDLCALC 31 07/19/2020 1036    Other Studies Reviewed Today:  [facility] Myoview 10/23/2018:  There was no ST segment deviation noted during stress.  The study is normal. There are no perfusion defects  This is a low risk study.  The left ventricular ejection fraction is normal (55-65%).  Echocardiogram 03/22/2020: 1. Left ventricular ejection fraction, by estimation, is 50 to 55%. The  left ventricle has low normal function. The left ventricle demonstrates  regional wall motion abnormalities (see scoring diagram/findings for  description). There is moderate  asymmetric left ventricular hypertrophy of the septal segment. Left  ventricular diastolic parameters are indeterminate.  2. Right ventricular systolic function is normal. The right ventricular  size is normal. There is normal pulmonary artery systolic pressure.  3. Left atrial size was severely dilated.  4. Right atrial size was mildly dilated.  5. The mitral valve is abnormal, moderate annular calcification. Mild to  moderate mitral valve regurgitation.  6. 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  Aortic valve regurgitation is not visualized. Moderate aortic valve  stenosis. Aortic valve area, by VTI measures 1.49 cm. Aortic valve mean  gradient measures 15.5 mmHg. Dimentionless  index 0.39.  7. The inferior vena cava is normal in size with greater than 50%  respiratory variability, suggesting right atrial pressure of 3 mmHg.   Assessment and Plan:  1. Permanent atrial fibrillation ([place])   2. Swelling of right lower extremity    1. Permanent atrial fibrillation (HCC) Rate is controlled today at 74.  No evidence of bleeding.  No complaints of palpitations.  Continue Coumadin as directed by Coumadin clinic.  Continue diltiazem 240 mg daily.  Continue Toprol-XL 100 mg daily.  2. Swelling of right lower  extremity Complaining of recent swelling of right lower extremity.  Denies any tachycardia, pleuritic chest pain, or shortness of breath.  States the right leg was much larger than the left.  States he started taking extra Lasix for lower extremity edema which has improved the swelling.  Please get a D-dimer and venous Doppler study of right lower extremity to assess for DVT.  Medication Adjustments/Labs and Tests Ordered: Current medicines are reviewed at length with the patient today.  Concerns regarding medicines are outlined above.   Disposition: Follow-up with Dr. Domenic Polite per schedule Signed, Levell July, NP 11/10/2020 8:31 AM    [facility], [place], [place] [address] Phone: [phone]; Fax: [phone]

## 2020-11-10 ENCOUNTER — Ambulatory Visit (INDEPENDENT_AMBULATORY_CARE_PROVIDER_SITE_OTHER): Payer: Medicare HMO | Admitting: Family Medicine

## 2020-11-10 ENCOUNTER — Encounter: Payer: Self-pay | Admitting: Family Medicine

## 2020-11-10 VITALS — BP 130/60 | HR 74 | Ht 70.0 in | Wt 234.8 lb

## 2020-11-10 DIAGNOSIS — M7989 Other specified soft tissue disorders: Secondary | ICD-10-CM | POA: Diagnosis not present

## 2020-11-10 DIAGNOSIS — Q231 Congenital insufficiency of aortic valve: Secondary | ICD-10-CM

## 2020-11-10 DIAGNOSIS — I4821 Permanent atrial fibrillation: Secondary | ICD-10-CM

## 2020-11-10 DIAGNOSIS — E785 Hyperlipidemia, unspecified: Secondary | ICD-10-CM

## 2020-11-10 DIAGNOSIS — I2510 Atherosclerotic heart disease of native coronary artery without angina pectoris: Secondary | ICD-10-CM

## 2020-11-10 NOTE — Patient Instructions (Addendum)
Medication Instructions:   Your physician recommends that you continue on your current medications as directed. Please refer to the Current Medication list given to you today.  Labwork:  Your physician recommends that you return for lab work in: today to check your D-Dimer. This may be done at [facility] in [place].   Testing/Procedures: Your physician has requested that you have a lower extremity venous duplex. This test is an ultrasound of the veins in the legs or arms. It looks at venous blood flow that carries blood from the heart to the legs or arms. Allow one hour for a Lower Venous exam. Allow thirty minutes for an Upper Venous exam. There are no restrictions or special instructions.  Follow-Up:  Your physician recommends that you schedule a follow-up appointment in: July 2022 with Domenic Polite as planned.   Any Other Special Instructions Will Be Listed Below (If Applicable).  If you need a refill on your cardiac medications before your next appointment, please call your pharmacy.

## 2020-11-11 ENCOUNTER — Telehealth: Payer: Self-pay | Admitting: *Deleted

## 2020-11-11 DIAGNOSIS — R7989 Other specified abnormal findings of blood chemistry: Secondary | ICD-10-CM

## 2020-11-11 DIAGNOSIS — M7989 Other specified soft tissue disorders: Secondary | ICD-10-CM

## 2020-11-11 LAB — D-DIMER, QUANTITATIVE: D-DIMER: 0.78 mg/L FEU — ABNORMAL HIGH (ref 0.00–0.49)

## 2020-11-11 NOTE — Telephone Encounter (Signed)
-----   Message from Verta Ellen., NP sent at 11/11/2020 12:40 PM EST ----- His D-dimer is elevated but when age-adjusted it is not indicative of a blood clot.  He needs to have his follow-up DVT study just to make sure there is no clot in his leg.  He was not short of breath he was not having tachycardia he was not having pleuritic chest pain all of which would point to a PE, but just to make sure he needs the lower venous study.  Thanks

## 2020-11-11 NOTE — Telephone Encounter (Signed)
Patient informed and verbalized understanding of plan. 

## 2020-11-14 ENCOUNTER — Other Ambulatory Visit: Payer: Self-pay

## 2020-11-14 ENCOUNTER — Ambulatory Visit (HOSPITAL_COMMUNITY)
Admission: RE | Admit: 2020-11-14 | Discharge: 2020-11-14 | Disposition: A | Discharge status: Home / Self Care | Payer: Medicare HMO | Source: Ambulatory Visit | Attending: Family Medicine | Admitting: Family Medicine

## 2020-11-14 DIAGNOSIS — M7989 Other specified soft tissue disorders: Secondary | ICD-10-CM

## 2020-11-14 DIAGNOSIS — R600 Localized edema: Secondary | ICD-10-CM | POA: Diagnosis not present

## 2020-11-16 ENCOUNTER — Telehealth: Payer: Self-pay | Admitting: *Deleted

## 2020-11-16 ENCOUNTER — Ambulatory Visit (INDEPENDENT_AMBULATORY_CARE_PROVIDER_SITE_OTHER): Payer: Medicare HMO | Admitting: *Deleted

## 2020-11-16 DIAGNOSIS — Z7901 Long term (current) use of anticoagulants: Secondary | ICD-10-CM

## 2020-11-16 DIAGNOSIS — I482 Chronic atrial fibrillation, unspecified: Secondary | ICD-10-CM | POA: Diagnosis not present

## 2020-11-16 DIAGNOSIS — Z5181 Encounter for therapeutic drug level monitoring: Secondary | ICD-10-CM

## 2020-11-16 LAB — POCT INR: INR: 2.2 (ref 2.0–3.0)

## 2020-11-16 NOTE — Patient Instructions (Signed)
Continue warfarin 1 tablet daily.   Recheck in 4 weeks   

## 2020-11-16 NOTE — Telephone Encounter (Signed)
Patient informed. Copy sent to PCP °

## 2020-11-16 NOTE — Telephone Encounter (Signed)
-----   Message from Verta Ellen., NP sent at 11/14/2020  3:43 PM EDT ----- Please call the patient and let him know the lower venous study did not show any evidence of a blood clot and the right leg.  Thank you

## 2020-12-14 ENCOUNTER — Other Ambulatory Visit: Payer: Self-pay

## 2020-12-14 ENCOUNTER — Ambulatory Visit (INDEPENDENT_AMBULATORY_CARE_PROVIDER_SITE_OTHER): Payer: Medicare HMO | Admitting: *Deleted

## 2020-12-14 DIAGNOSIS — I482 Chronic atrial fibrillation, unspecified: Secondary | ICD-10-CM

## 2020-12-14 DIAGNOSIS — Z5181 Encounter for therapeutic drug level monitoring: Secondary | ICD-10-CM

## 2020-12-14 DIAGNOSIS — Z7901 Long term (current) use of anticoagulants: Secondary | ICD-10-CM

## 2020-12-14 LAB — POCT INR: INR: 3.1 — AB (ref 2.0–3.0)

## 2020-12-14 NOTE — Patient Instructions (Signed)
Take 1/2 tablet tomorrow then resume 1 tablet daily.   Recheck in 4 weeks.

## 2020-12-18 ENCOUNTER — Other Ambulatory Visit: Payer: Self-pay | Admitting: Cardiology

## 2020-12-20 ENCOUNTER — Other Ambulatory Visit: Payer: Self-pay | Admitting: Cardiovascular Disease

## 2021-01-05 DIAGNOSIS — I781 Nevus, non-neoplastic: Secondary | ICD-10-CM | POA: Diagnosis not present

## 2021-01-05 DIAGNOSIS — I872 Venous insufficiency (chronic) (peripheral): Secondary | ICD-10-CM | POA: Diagnosis not present

## 2021-01-06 DIAGNOSIS — G894 Chronic pain syndrome: Secondary | ICD-10-CM | POA: Diagnosis not present

## 2021-01-11 ENCOUNTER — Ambulatory Visit (INDEPENDENT_AMBULATORY_CARE_PROVIDER_SITE_OTHER): Payer: Medicare HMO | Admitting: *Deleted

## 2021-01-11 DIAGNOSIS — Z7901 Long term (current) use of anticoagulants: Secondary | ICD-10-CM

## 2021-01-11 DIAGNOSIS — I482 Chronic atrial fibrillation, unspecified: Secondary | ICD-10-CM

## 2021-01-11 LAB — POCT INR: INR: 2.9 (ref 2.0–3.0)

## 2021-01-11 NOTE — Patient Instructions (Signed)
Continue warfarin 1 tablet daily  Recheck in 6 weeks.  

## 2021-01-16 DIAGNOSIS — M1990 Unspecified osteoarthritis, unspecified site: Secondary | ICD-10-CM | POA: Diagnosis not present

## 2021-01-16 DIAGNOSIS — E669 Obesity, unspecified: Secondary | ICD-10-CM | POA: Diagnosis not present

## 2021-01-16 DIAGNOSIS — G8929 Other chronic pain: Secondary | ICD-10-CM | POA: Diagnosis not present

## 2021-01-16 DIAGNOSIS — I25119 Atherosclerotic heart disease of native coronary artery with unspecified angina pectoris: Secondary | ICD-10-CM | POA: Diagnosis not present

## 2021-01-16 DIAGNOSIS — D6869 Other thrombophilia: Secondary | ICD-10-CM | POA: Diagnosis not present

## 2021-01-16 DIAGNOSIS — E785 Hyperlipidemia, unspecified: Secondary | ICD-10-CM | POA: Diagnosis not present

## 2021-01-16 DIAGNOSIS — I739 Peripheral vascular disease, unspecified: Secondary | ICD-10-CM | POA: Diagnosis not present

## 2021-01-16 DIAGNOSIS — G629 Polyneuropathy, unspecified: Secondary | ICD-10-CM | POA: Diagnosis not present

## 2021-01-16 DIAGNOSIS — I10 Essential (primary) hypertension: Secondary | ICD-10-CM | POA: Diagnosis not present

## 2021-01-16 DIAGNOSIS — I4891 Unspecified atrial fibrillation: Secondary | ICD-10-CM | POA: Diagnosis not present

## 2021-01-17 ENCOUNTER — Telehealth (INDEPENDENT_AMBULATORY_CARE_PROVIDER_SITE_OTHER): Payer: Medicare HMO | Admitting: Family Medicine

## 2021-01-17 ENCOUNTER — Other Ambulatory Visit: Payer: Self-pay

## 2021-01-17 DIAGNOSIS — I509 Heart failure, unspecified: Secondary | ICD-10-CM | POA: Diagnosis not present

## 2021-01-17 DIAGNOSIS — I10 Essential (primary) hypertension: Secondary | ICD-10-CM | POA: Diagnosis not present

## 2021-01-17 DIAGNOSIS — I4821 Permanent atrial fibrillation: Secondary | ICD-10-CM

## 2021-01-17 NOTE — Progress Notes (Signed)
Patient ID: Leonard Cisneros, male    DOB: [birth year], 80 y.o.   MRN: [id number]  Virtual Visit via Telephone Note  I connected with Leonard Cisneros on 01/17/21 at  9:00 AM EDT by telephone and verified that I am speaking with the correct person using two identifiers.  Location: Patient: home Provider: office   I discussed the limitations, risks, security and privacy concerns of performing an evaluation and management service by telephone and the availability of in person appointments. I also discussed with the patient that there may be a patient responsible charge related to this service. The patient expressed understanding and agreed to proceed.  Chief Complaint  Patient presents with  . Hyperlipidemia   Subjective:    HPI  F/u med check up for htn, chf, Afib. Pt states no concerns today.  Had a home visit from [place]- and bp was checked- 130/65 at that time. This was yesterday.  On anticoagulation- not having any bleeding.  HTN Pt compliant with BP meds.  No SEs Denies chest pain, sob, LE swelling, or blurry vision.  Afib and CHF- seeing cardiology. Doing well and no new concerns.   Medical History Leonard Cisneros has a past medical history of Aortic stenosis, Atrial flutter ([place]), CAD (coronary artery disease), Chronic lower back pain, Essential hypertension, Hyperlipidemia, Ischemic cardiomyopathy, Peripheral neuropathy (04/04/2014), and Permanent atrial fibrillation ([place]).   Outpatient Encounter Medications as of 01/17/2021  Medication Sig  . aspirin EC 81 MG tablet Take 81 mg by mouth daily.  [name] atorvastatin (LIPITOR) 80 MG tablet TAKE 1 TABLET BY MOUTH EVERY DAY  . diltiazem (DILT-XR) 240 MG 24 hr capsule TAKE 1 CAPSULE BY MOUTH EVERY DAY  . furosemide (LASIX) 40 MG tablet TAKE ONE DAILY. MAY TAKE AN EXTRA TABLET AS NEEDED FOR EDEMA.  [name] HYDROcodone-acetaminophen (NORCO) 5-325 MG tablet Take 1 tablet by mouth 3 (three) times daily as needed for moderate pain.  .  metoprolol succinate (TOPROL-XL) 100 MG 24 hr tablet TAKE 1 TABLET (100 MG TOTAL) BY MOUTH DAILY. TAKE WITH OR IMMEDIATELY FOLLOWING A MEAL.  . nitroGLYCERIN (NITROSTAT) 0.4 MG SL tablet Place 1 tablet (0.4 mg total) under the tongue every 5 (five) minutes x 3 doses as needed for chest pain (if no relief after 3rd dose, proceed to the ED for an evaluation or call 911).  . warfarin (COUMADIN) 7.5 MG tablet Take 1/2 to 1 tablet daily as directed by coumadin clinic   No facility-administered encounter medications on file as of 01/17/2021.     Review of Systems  Constitutional: Negative for chills and fever.  HENT: Negative for congestion, rhinorrhea and sore throat.   Respiratory: Negative for cough, shortness of breath and wheezing.   Cardiovascular: Negative for chest pain and leg swelling.  Gastrointestinal: Negative for abdominal pain, diarrhea, nausea and vomiting.  Genitourinary: Negative for dysuria and frequency.  Skin: Negative for rash.  Neurological: Negative for dizziness, weakness and headaches.     Vitals There were no vitals taken for this visit.  Objective:   Physical Exam  No PE due to phone visit.  Assessment and Plan   1. Essential hypertension  2. Permanent atrial fibrillation ([place])  3. Congestive heart failure, unspecified HF chronicity, unspecified heart failure type (HCC)   Htn, chf, Afib- Pt doing well.  No new concerns.   Getting medication refills from cardiology.  htn- stable. Cont meds.  afib- cont meds and coumadin.  No bleeding issues.  Return in about 6 months (around  07/20/2021).  01/17/2021    Follow Up Instructions:    I discussed the assessment and treatment plan with the patient. The patient was provided an opportunity to ask questions and all were answered. The patient agreed with the plan and demonstrated an understanding of the instructions.   The patient was advised to call back or seek an in-person evaluation if the symptoms  worsen or if the condition fails to improve as anticipated.  I provided 11 minutes of non-face-to-face time during this encounter.

## 2021-02-02 ENCOUNTER — Ambulatory Visit: Payer: Medicare HMO | Admitting: Cardiology

## 2021-02-22 ENCOUNTER — Ambulatory Visit (INDEPENDENT_AMBULATORY_CARE_PROVIDER_SITE_OTHER): Payer: Medicare HMO | Admitting: *Deleted

## 2021-02-22 ENCOUNTER — Other Ambulatory Visit: Payer: Self-pay

## 2021-02-22 DIAGNOSIS — I482 Chronic atrial fibrillation, unspecified: Secondary | ICD-10-CM | POA: Diagnosis not present

## 2021-02-22 DIAGNOSIS — Z7901 Long term (current) use of anticoagulants: Secondary | ICD-10-CM | POA: Diagnosis not present

## 2021-02-22 DIAGNOSIS — I4891 Unspecified atrial fibrillation: Secondary | ICD-10-CM

## 2021-02-22 DIAGNOSIS — Z5181 Encounter for therapeutic drug level monitoring: Secondary | ICD-10-CM | POA: Diagnosis not present

## 2021-02-22 LAB — POCT INR: INR: 3.4 — AB (ref 2.0–3.0)

## 2021-02-22 NOTE — Patient Instructions (Signed)
Hold warfarin tomorrow then resume 1 tablets daily  Recheck in 5 weeks

## 2021-03-02 ENCOUNTER — Other Ambulatory Visit: Payer: Medicare HMO

## 2021-03-04 ENCOUNTER — Other Ambulatory Visit: Payer: Self-pay | Admitting: Family Medicine

## 2021-03-16 ENCOUNTER — Other Ambulatory Visit: Payer: Medicare HMO

## 2021-03-22 ENCOUNTER — Other Ambulatory Visit: Payer: Medicare HMO

## 2021-03-23 ENCOUNTER — Ambulatory Visit (INDEPENDENT_AMBULATORY_CARE_PROVIDER_SITE_OTHER): Payer: Medicare HMO

## 2021-03-23 DIAGNOSIS — I350 Nonrheumatic aortic (valve) stenosis: Secondary | ICD-10-CM

## 2021-03-23 LAB — ECHOCARDIOGRAM COMPLETE
AR max vel: 1.17 cm2
AV Area VTI: 1.25 cm2
AV Area mean vel: 1.17 cm2
AV Mean grad: 18.5 mmHg
AV Peak grad: 32 mmHg
Ao pk vel: 2.83 m/s
Area-P 1/2: 4.86 cm2
Calc EF: 56.8 %
MV M vel: 4.93 m/s
MV Peak grad: 97.2 mmHg
Radius: 0.9 cm
S' Lateral: 3.46 cm
Single Plane A2C EF: 59.3 %
Single Plane A4C EF: 55.6 %

## 2021-03-27 ENCOUNTER — Telehealth: Payer: Self-pay | Admitting: Cardiology

## 2021-03-27 DIAGNOSIS — I872 Venous insufficiency (chronic) (peripheral): Secondary | ICD-10-CM | POA: Diagnosis not present

## 2021-03-27 DIAGNOSIS — L570 Actinic keratosis: Secondary | ICD-10-CM | POA: Diagnosis not present

## 2021-03-27 DIAGNOSIS — L0100 Impetigo, unspecified: Secondary | ICD-10-CM | POA: Diagnosis not present

## 2021-03-27 DIAGNOSIS — X32XXXD Exposure to sunlight, subsequent encounter: Secondary | ICD-10-CM | POA: Diagnosis not present

## 2021-03-27 NOTE — Telephone Encounter (Signed)
Mr. Ottaviani called said he saw his Dermatologist today. 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 They are concerned about him taking it with his coumadin.  Please let him know what Dr Domenic Polite wants him to do. He can be reached at [phone].

## 2021-03-27 NOTE — Telephone Encounter (Signed)
Spoke with pt.  Was started on Keflex '500mg'$  3 x day x 12 days by dermatology.  Informed pt it is OK to take keflex with warfarin.  He will start tonight.  He has an INR appt for 03/30/21 already scheduled.  Will check pt then.

## 2021-03-29 NOTE — Progress Notes (Signed)
Cardiology Office Note  Date: 03/30/2021   ID: Leonard Cisneros, DOB 1941-02-26, MRN [id number]  PCP:  Erven Colla, DO  Cardiologist:  Rozann Lesches, MD Electrophysiologist:  None   Chief Complaint  Patient presents with   Cardiac follow-up    History of Present Illness: Leonard Cisneros is a 80 y.o. male last seen in March by Mr. Leonard Cisneros.  He is here for a routine visit.  Reports no change in stamina, no exertional chest pain, stable NYHA class II dyspnea.  Recent follow-up echocardiogram revealed LVEF 55 to 60% with moderate diastolic dysfunction, severely dilated left atrium with only mild mitral regurgitation, and moderate calcific aortic stenosis with mean gradient 19 mmHg.  We discussed the results and we will continue observation for now.  He continues on Coumadin with follow-up in the anticoagulation clinic.  Last INR was 3.4 with adjustments made.  We did talk about possible conversion to Eliquis, he has considered this (his wife is on Eliquis), but has not wanted to make any changes as yet.  I personally reviewed his ECG today which shows rate controlled atrial fibrillation with nonspecific ST changes.  Past Medical History:  Diagnosis Date   Aortic stenosis    Atrial flutter ([place])    a. s/p remote ablation by Dr. [name].   CAD (coronary artery disease)    a. s/p CABG 2003. b. 01/2016: unstable angina - s/p BMS to SVG-ramus intermediate, patent LIMA-mLAD, patent RIMA-dRCA.    Chronic lower back pain    Essential hypertension    Hyperlipidemia    Ischemic cardiomyopathy    a. Cath 01/2016 -  EF 50-55% with mild distal inferior hypocontractility.   Peripheral neuropathy 04/04/2014   Permanent atrial fibrillation [facility])     Past Surgical History:  Procedure Laterality Date   BACK SURGERY     CARDIAC CATHETERIZATION  ~ 2000; 2003   CARDIAC CATHETERIZATION N/A 02/01/2016   Procedure: Left Heart Cath and Cors/Grafts Angiography;  Surgeon: [name], MD;   Location: [place] [facility];  Service: Cardiovascular;  Laterality: N/A;   CARDIAC CATHETERIZATION N/A 02/01/2016   Procedure: Coronary Stent Intervention;  Surgeon: [name], MD;  Location: [place] [facility];  Service: Cardiovascular;  Laterality: N/A;   CATARACT EXTRACTION W/PHACO Left 02/09/2019   Procedure: CATARACT EXTRACTION PHACO AND INTRAOCULAR LENS PLACEMENT (IOC);  Surgeon: [name], MD;  [facility];  Service: Ophthalmology;  Laterality: Left;  CDE: 27.70   CATARACT EXTRACTION W/PHACO Right 02/23/2019   Procedure: CATARACT EXTRACTION PHACO AND INTRAOCULAR LENS PLACEMENT RIGHT EYE;  Surgeon: [name], MD;  Location: [facility];  Service: Ophthalmology;  Laterality: Right;  CDE: 10.15   COLONOSCOPY  06/04/2002   Normal colon and rectum   COLONOSCOPY  07/08/2012   Procedure: COLONOSCOPY;  Surgeon: [name], MD;  [facility];  Service: Endoscopy;  Laterality: N/A;  11:30   COLONOSCOPY N/A 10/02/2017   Procedure: COLONOSCOPY;  Surgeon: [name], MD;  [facility];  Service: Endoscopy;  Laterality: N/A;  10:30am   CORONARY ANGIOPLASTY WITH STENT PLACEMENT  02/01/2016   CORONARY ARTERY BYPASS GRAFT  2003   LIMA to LAD,LIMA to distal RCA,SVG to ramus intermediate vessel.   FEMORAL REVISION Right 03/30/2019   Procedure: Femoral revision right total hip arthroplasty;  Surgeon: [name], MD;  Location: [facility];  Service: Orthopedics;  Laterality: Right;  144mn   FOREIGN BODY REMOVAL Right 04/02/2014  Procedure: FOREIGN BODY REMOVAL RIGHT FOOT;  Surgeon: [name], MD;  Location: [facility];  Service: General;  Laterality: Right;   JOINT REPLACEMENT     JOINT REPLACEMENT     LUMBAR [place] SURGERY     REVISION TOTAL HIP ARTHROPLASTY Bilateral 2005-2012   right-left   SHOULDER OPEN ROTATOR CUFF REPAIR Right    TOTAL HIP ARTHROPLASTY Right 1999   TOTAL HIP ARTHROPLASTY Left 2008   US ECHOCARDIOGRAPHY  11/13/2011   LV mildly dilated,mild  concentric LVH,LA mod - severely dilated,RA mildly dilated,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    Current Outpatient Medications  Medication Sig Dispense Refill   aspirin EC 81 MG tablet Take 81 mg by mouth daily.     atorvastatin (LIPITOR) 80 MG tablet TAKE 1 TABLET BY MOUTH EVERY DAY 90 tablet 1   diltiazem (DILT-XR) 240 MG 24 hr capsule TAKE 1 CAPSULE BY MOUTH EVERY DAY 90 capsule 3   furosemide (LASIX) 40 MG tablet TAKE ONE DAILY. MAY TAKE AN EXTRA TABLET AS NEEDED FOR EDEMA. 180 tablet 1   HYDROcodone-acetaminophen (NORCO) 5-325 MG tablet Take 1 tablet by mouth 3 (three) times daily as needed for moderate pain. 30 tablet 0   metoprolol succinate (TOPROL-XL) 100 MG 24 hr tablet TAKE 1 TABLET (100 MG TOTAL) BY MOUTH DAILY. TAKE WITH OR IMMEDIATELY FOLLOWING A MEAL. 90 tablet 1   nitroGLYCERIN (NITROSTAT) 0.4 MG SL tablet Place 1 tablet (0.4 mg total) under the tongue every 5 (five) minutes x 3 doses as needed for chest pain (if no relief after 3rd dose, proceed to the ED for an evaluation or call 911). 25 tablet 3   warfarin (COUMADIN) 7.5 MG tablet TAKE  1 TABLET DAILY AS DIRECTED BY COUMADIN CLINIC 90 tablet 1   No current facility-administered medications for this visit.   Allergies:  Augmentin [amoxicillin-pot clavulanate]   ROS: No sense of palpitations, no syncope.  Physical Exam: VS:  BP (!) 128/58   Pulse 69   Ht '5\' 11"'$  (1.803 m)   Wt 226 lb 12.8 oz (102.9 kg)   SpO2 94%   BMI 31.63 kg/m , BMI Body mass index is 31.63 kg/m.  Wt Readings from Last 3 Encounters:  03/30/21 226 lb 12.8 oz (102.9 kg)  11/10/20 234 lb 12.8 oz (106.5 kg)  09/08/20 224 lb (101.6 kg)    General: Patient appears comfortable at rest. HEENT: Conjunctiva and lids normal, wearing a mask. Neck: Supple, no elevated JVP or carotid bruits, no thyromegaly. Lungs: Clear to auscultation, nonlabored breathing at rest. Cardiac:  Irregularly irregular, no S3, 2/6 systolic murmur, no pericardial rub. Abdomen: Soft, nontender, bowel sounds present. Extremities: No pitting edema, distal pulses 2+.  ECG:  An ECG dated 04/07/2020 was personally reviewed today and demonstrated:  Atrial fibrillation.  Recent Labwork: 07/19/2020: ALT 28; AST 22; BUN 16; Creatinine, Ser 0.83; Hemoglobin 12.4; Platelets 262; Potassium 4.9; Sodium 141     Component Value Date/Time   CHOL 142 07/19/2020 1036   TRIG 48 07/19/2020 1036   HDL 100 07/19/2020 1036   CHOLHDL 1.4 07/19/2020 1036   CHOLHDL 1.7 10/23/2018 0050   VLDL 11 10/23/2018 0050   LDLCALC 31 07/19/2020 1036    Other Studies Reviewed Today:  Echocardiogram 03/23/2021:  1. Left ventricular ejection fraction, by estimation, is 55 to 60%. The  left ventricle has normal function. Left ventricular endocardial border  not optimally defined to evaluate  regional wall motion. There is mild left  ventricular hypertrophy. Left  ventricular diastolic parameters are consistent with Grade II diastolic  dysfunction (pseudonormalization). Elevated left atrial pressure.   2. Right ventricular systolic function is normal. The right ventricular  size is normal. Tricuspid regurgitation signal is inadequate for assessing  PA pressure.   3. Left atrial size was severely dilated.   4. Right atrial size was moderately dilated.   5. The mitral valve is normal in structure. Mild mitral valve  regurgitation. No evidence of mitral stenosis.   6. The aortic valve has an indeterminant number of cusps. There is  moderate calcification of the aortic valve. There is moderate thickening  of the aortic valve. Aortic valve regurgitation is not visualized.  Moderate aortic valve stenosis. Aortic valve  mean gradient measures 18.5 mmHg. Aortic valve peak gradient measures 32.0  mmHg. Aortic valve area, by VTI measures 1.25 cm.   Assessment and Plan:  1.  Permanent atrial fibrillation with CHA2DS2-VASc  score of 4.  He is asymptomatic with good heart rate control on combination of diltiazem CD and Toprol-XL.  He has been on Coumadin long-term for stroke prophylaxis followed in the anticoagulation clinic.  We did talk about considering a switch to Eliquis which he has been somewhat hesitant to do.  2.  Moderate calcific aortic stenosis with mean gradient 19 mmHg by recent echocardiogram and normal LVEF at 55 to 60%.  Continue observation.  Plan repeat echocardiogram next year.  3.  Multivessel CAD status post CABG in 2003 with subsequent BMS to the SVG to ramus intermedius in 2017.  He reports no active angina at this time.  Continue Toprol-XL, aspirin, and Lipitor.  Medication Adjustments/Labs and Tests Ordered: Current medicines are reviewed at length with the patient today.  Concerns regarding medicines are outlined above.   Tests Ordered: No orders of the defined types were placed in this encounter.   Medication Changes: No orders of the defined types were placed in this encounter.   Disposition:  Follow up  6 months.  Signed, Satira Sark, MD, [facility] 03/30/2021 10:49 AM    [facility] [address], [place], [place] [address] Phone: [phone]; Fax: [phone]

## 2021-03-30 ENCOUNTER — Encounter: Payer: Self-pay | Admitting: Cardiology

## 2021-03-30 ENCOUNTER — Other Ambulatory Visit: Payer: Self-pay

## 2021-03-30 ENCOUNTER — Ambulatory Visit (INDEPENDENT_AMBULATORY_CARE_PROVIDER_SITE_OTHER): Payer: Medicare HMO | Admitting: *Deleted

## 2021-03-30 ENCOUNTER — Ambulatory Visit (INDEPENDENT_AMBULATORY_CARE_PROVIDER_SITE_OTHER): Payer: Medicare HMO | Admitting: Cardiology

## 2021-03-30 VITALS — BP 128/58 | HR 69 | Ht 71.0 in | Wt 226.8 lb

## 2021-03-30 DIAGNOSIS — Z7901 Long term (current) use of anticoagulants: Secondary | ICD-10-CM | POA: Diagnosis not present

## 2021-03-30 DIAGNOSIS — I4821 Permanent atrial fibrillation: Secondary | ICD-10-CM | POA: Diagnosis not present

## 2021-03-30 DIAGNOSIS — I482 Chronic atrial fibrillation, unspecified: Secondary | ICD-10-CM

## 2021-03-30 DIAGNOSIS — I4891 Unspecified atrial fibrillation: Secondary | ICD-10-CM

## 2021-03-30 DIAGNOSIS — I25119 Atherosclerotic heart disease of native coronary artery with unspecified angina pectoris: Secondary | ICD-10-CM | POA: Diagnosis not present

## 2021-03-30 DIAGNOSIS — I350 Nonrheumatic aortic (valve) stenosis: Secondary | ICD-10-CM

## 2021-03-30 LAB — POCT INR: INR: 2.1 (ref 2.0–3.0)

## 2021-03-30 MED ORDER — WARFARIN SODIUM 7.5 MG PO TABS: ORAL_TABLET | ORAL | 1 refills | Status: DC

## 2021-03-30 NOTE — Addendum Note (Signed)
Addended by: Laurine Blazer on: 03/30/2021 11:01 AM   Modules accepted: Orders

## 2021-03-30 NOTE — Patient Instructions (Signed)

## 2021-03-30 NOTE — Patient Instructions (Signed)
Continue warfarin 1 tablet daily  Recheck in 6 weeks.  

## 2021-04-10 ENCOUNTER — Other Ambulatory Visit: Payer: Self-pay | Admitting: Cardiology

## 2021-05-11 ENCOUNTER — Other Ambulatory Visit: Payer: Self-pay

## 2021-05-11 ENCOUNTER — Ambulatory Visit (INDEPENDENT_AMBULATORY_CARE_PROVIDER_SITE_OTHER): Payer: Medicare HMO | Admitting: *Deleted

## 2021-05-11 DIAGNOSIS — I482 Chronic atrial fibrillation, unspecified: Secondary | ICD-10-CM | POA: Diagnosis not present

## 2021-05-11 DIAGNOSIS — Z5181 Encounter for therapeutic drug level monitoring: Secondary | ICD-10-CM

## 2021-05-11 DIAGNOSIS — Z7901 Long term (current) use of anticoagulants: Secondary | ICD-10-CM

## 2021-05-11 LAB — POCT INR: INR: 3.7 — AB (ref 2.0–3.0)

## 2021-05-11 NOTE — Patient Instructions (Signed)
Hold warfarin tomorrow then resume 1 tablet daily  Recheck in 4 weeks

## 2021-05-17 ENCOUNTER — Other Ambulatory Visit: Payer: Self-pay | Admitting: *Deleted

## 2021-05-17 MED ORDER — FUROSEMIDE 40 MG PO TABS: ORAL_TABLET | ORAL | 3 refills | Status: DC

## 2021-06-08 ENCOUNTER — Ambulatory Visit (INDEPENDENT_AMBULATORY_CARE_PROVIDER_SITE_OTHER): Payer: Medicare HMO | Admitting: *Deleted

## 2021-06-08 ENCOUNTER — Other Ambulatory Visit: Payer: Self-pay | Admitting: Cardiology

## 2021-06-08 ENCOUNTER — Other Ambulatory Visit: Payer: Self-pay

## 2021-06-08 DIAGNOSIS — Z7901 Long term (current) use of anticoagulants: Secondary | ICD-10-CM | POA: Diagnosis not present

## 2021-06-08 DIAGNOSIS — I482 Chronic atrial fibrillation, unspecified: Secondary | ICD-10-CM | POA: Diagnosis not present

## 2021-06-08 DIAGNOSIS — Z5181 Encounter for therapeutic drug level monitoring: Secondary | ICD-10-CM | POA: Diagnosis not present

## 2021-06-08 LAB — POCT INR: INR: 3.3 — AB (ref 2.0–3.0)

## 2021-06-08 NOTE — Patient Instructions (Signed)
Decrease warfarin to 1 tablet daily except 1/2 tablet on Thursdays Recheck in 4 weeks

## 2021-06-15 DIAGNOSIS — M961 Postlaminectomy syndrome, not elsewhere classified: Secondary | ICD-10-CM | POA: Diagnosis not present

## 2021-06-15 DIAGNOSIS — G894 Chronic pain syndrome: Secondary | ICD-10-CM | POA: Insufficient documentation

## 2021-06-15 DIAGNOSIS — M792 Neuralgia and neuritis, unspecified: Secondary | ICD-10-CM | POA: Diagnosis not present

## 2021-06-15 DIAGNOSIS — Z5181 Encounter for therapeutic drug level monitoring: Secondary | ICD-10-CM | POA: Diagnosis not present

## 2021-06-15 DIAGNOSIS — Z79891 Long term (current) use of opiate analgesic: Secondary | ICD-10-CM | POA: Diagnosis not present

## 2021-06-15 DIAGNOSIS — I482 Chronic atrial fibrillation, unspecified: Secondary | ICD-10-CM | POA: Diagnosis not present

## 2021-06-30 DIAGNOSIS — G894 Chronic pain syndrome: Secondary | ICD-10-CM | POA: Diagnosis not present

## 2021-07-06 ENCOUNTER — Ambulatory Visit (INDEPENDENT_AMBULATORY_CARE_PROVIDER_SITE_OTHER): Payer: Medicare HMO | Admitting: *Deleted

## 2021-07-06 DIAGNOSIS — I482 Chronic atrial fibrillation, unspecified: Secondary | ICD-10-CM | POA: Diagnosis not present

## 2021-07-06 DIAGNOSIS — Z5181 Encounter for therapeutic drug level monitoring: Secondary | ICD-10-CM

## 2021-07-06 DIAGNOSIS — Z7901 Long term (current) use of anticoagulants: Secondary | ICD-10-CM | POA: Diagnosis not present

## 2021-07-06 LAB — POCT INR: INR: 2.1 (ref 2.0–3.0)

## 2021-07-06 NOTE — Patient Instructions (Signed)
Continue warfarin to 1 tablet daily except 1/2 tablet on Thursdays Recheck in 4 weeks

## 2021-08-03 ENCOUNTER — Ambulatory Visit (INDEPENDENT_AMBULATORY_CARE_PROVIDER_SITE_OTHER): Payer: Medicare HMO | Admitting: *Deleted

## 2021-08-03 DIAGNOSIS — Z7901 Long term (current) use of anticoagulants: Secondary | ICD-10-CM

## 2021-08-03 DIAGNOSIS — I482 Chronic atrial fibrillation, unspecified: Secondary | ICD-10-CM

## 2021-08-03 DIAGNOSIS — Z5181 Encounter for therapeutic drug level monitoring: Secondary | ICD-10-CM

## 2021-08-03 LAB — POCT INR: INR: 1.6 — AB (ref 2.0–3.0)

## 2021-08-03 NOTE — Patient Instructions (Signed)
Increase warfarin to 1 tablet daily Recheck in 3 weeks

## 2021-08-29 ENCOUNTER — Other Ambulatory Visit: Payer: Self-pay

## 2021-08-29 ENCOUNTER — Ambulatory Visit (INDEPENDENT_AMBULATORY_CARE_PROVIDER_SITE_OTHER): Payer: Medicare HMO | Admitting: *Deleted

## 2021-08-29 DIAGNOSIS — Z5181 Encounter for therapeutic drug level monitoring: Secondary | ICD-10-CM | POA: Diagnosis not present

## 2021-08-29 DIAGNOSIS — I482 Chronic atrial fibrillation, unspecified: Secondary | ICD-10-CM

## 2021-08-29 DIAGNOSIS — Z7901 Long term (current) use of anticoagulants: Secondary | ICD-10-CM

## 2021-08-29 LAB — POCT INR
INR: 2.5 (ref 2.0–3.0)
INR: 3.5 — AB (ref 2.0–3.0)

## 2021-08-29 NOTE — Patient Instructions (Signed)
INR 2.5 today Continue warfarin 1 tablet daily Recheck in 4 weeks

## 2021-09-02 ENCOUNTER — Encounter: Payer: Self-pay | Admitting: Emergency Medicine

## 2021-09-02 ENCOUNTER — Ambulatory Visit
Admission: EM | Admit: 2021-09-02 | Discharge: 2021-09-02 | Disposition: A | Discharge status: Home / Self Care | Payer: Medicare HMO | Attending: Family Medicine | Admitting: Family Medicine

## 2021-09-02 ENCOUNTER — Other Ambulatory Visit: Payer: Self-pay

## 2021-09-02 DIAGNOSIS — K1370 Unspecified lesions of oral mucosa: Secondary | ICD-10-CM

## 2021-09-02 MED ORDER — CHLORHEXIDINE GLUCONATE 0.12 % MT SOLN: 15.0000 mL | Freq: Two times a day (BID) | OROMUCOSAL | 0 refills | Status: DC

## 2021-09-02 MED ORDER — LIDOCAINE VISCOUS HCL 2 % MT SOLN: 10.0000 mL | OROMUCOSAL | 0 refills | Status: DC | PRN

## 2021-09-02 NOTE — ED Triage Notes (Signed)
Pt c/o sores on his tongue and lips x 3 weeks. He went to the dentist and was prescribed a cream that didn't work.

## 2021-09-02 NOTE — ED Provider Notes (Signed)
[facility]    CSN: [id number] Arrival date & time: 09/02/21  1031      History   Chief Complaint Chief Complaint  Patient presents with   Sore    HPI Leonard Cisneros is a 80 y.o. male.   Patient presenting today with tongue and mouth sores the past few weeks that have been worse than his typical flareups.  He states he gets this every so often and his PCP or dentist will give him some topicals to clear it up.  Got something from his dentist several weeks ago which seemed to help but then symptoms came back recently and are slightly worse.  He is unable to eat due to the discomfort.  Denies drainage, fevers, chills, headaches, dysphagia, new medications or diet changes.  Trying some over-the-counter remedies like Orajel with no relief.   Past Medical History:  Diagnosis Date   Aortic stenosis    Atrial flutter ([place])    a. s/p remote ablation by Dr. [name].   CAD (coronary artery disease)    a. s/p CABG 2003. b. 01/2016: unstable angina - s/p BMS to SVG-ramus intermediate, patent LIMA-mLAD, patent RIMA-dRCA.    Chronic lower back pain    Essential hypertension    Hyperlipidemia    Ischemic cardiomyopathy    a. Cath 01/2016 -  EF 50-55% with mild distal inferior hypocontractility.   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   Chest pain 10/22/2018   Physical deconditioning 04/26/2018   Gait instability 04/26/2018   Closed fracture of greater trochanter of right femur with routine healing 04/20/18 04/25/2018   Leg pain 04/24/2018   Fall 04/21/2018   PAD (peripheral artery disease) ([place]) 04/21/2018   Hx of adenomatous colonic polyps 08/20/2017   Ischemic cardiomyopathy  02/02/2016   Obesity 02/02/2016   CAD S/P percutaneous coronary angioplasty 02/01/2016   Exertional angina ([place]) 01/28/2016   Hyperlipidemia LDL goal <70 01/28/2016   Neuropathic pain 06/13/2015   Mild obesity 04/20/2015   Essential hypertension 04/20/2015   Unspecified hereditary and idiopathic peripheral neuropathy 04/04/2014   Foreign body (FB) in soft tissue 03/31/2014   Impaired glucose metabolism 03/31/2014   Obesity (BMI 30-39.9) 03/31/2014   Hx of CABG 01/04/2014   Rotator cuff tear, left 10/22/2013   Rotator cuff syndrome of left shoulder 10/22/2013   Chronic atrial fibrillation ([place]) 06/18/2013   Long term (current) use of anticoagulants 06/18/2013   Screening for colon cancer 06/18/2012   High risk medication use 06/18/2012    Past Surgical History:  Procedure Laterality Date   BACK SURGERY     CARDIAC CATHETERIZATION  ~ 2000; 2003   CARDIAC CATHETERIZATION N/A 02/01/2016   Procedure: Left Heart Cath and Cors/Grafts Angiography;  Surgeon: [name], MD;  Location: [place] [facility];  Service: Cardiovascular;  Laterality: N/A;   CARDIAC CATHETERIZATION N/A 02/01/2016   Procedure: Coronary Stent Intervention;  Surgeon: [name], MD;  Location: [place] [facility];  Service: Cardiovascular;  Laterality: N/A;   CATARACT EXTRACTION W/PHACO Left 02/09/2019   Procedure: CATARACT EXTRACTION PHACO AND INTRAOCULAR LENS PLACEMENT (IOC);  Surgeon: [name], MD;  Location[facility];  Service: Ophthalmology;  Laterality: Left;  CDE: 27.70   CATARACT EXTRACTION W/PHACO Right 02/23/2019   Procedure: CATARACT EXTRACTION PHACO AND INTRAOCULAR LENS PLACEMENT RIGHT EYE;  Surgeon: [name], MD;  Location: [facility];  Service: Ophthalmology;  Laterality: Right;  CDE: 10.15   COLONOSCOPY  06/04/2002   Normal colon and rectum   COLONOSCOPY  07/08/2012   Procedure: COLONOSCOPY;  Surgeon: [name], MD;  [facility];  Service: Endoscopy;  Laterality: N/A;  11:30    COLONOSCOPY N/A 10/02/2017   Procedure: COLONOSCOPY;  Surgeon: [name], MD;  [facility];  Service: Endoscopy;  Laterality: N/A;  10:30am   CORONARY ANGIOPLASTY WITH STENT PLACEMENT  02/01/2016   CORONARY ARTERY BYPASS GRAFT  2003   LIMA to LAD,LIMA to distal RCA,SVG to ramus intermediate vessel.   FEMORAL REVISION Right 03/30/2019   Procedure: Femoral revision right total hip arthroplasty;  Surgeon: [name], MD;  [facility];  Service: Orthopedics;  Laterality: Right;  159min   FOREIGN BODY REMOVAL Right 04/02/2014   Procedure: FOREIGN BODY REMOVAL RIGHT FOOT;  Surgeon: [name], MD;  Location[facility];  Service: General;  Laterality: Right;   JOINT REPLACEMENT     JOINT REPLACEMENT     LUMBAR [place] SURGERY     REVISION TOTAL HIP ARTHROPLASTY Bilateral 2005-2012   right-left   SHOULDER OPEN ROTATOR CUFF REPAIR Right    TOTAL HIP ARTHROPLASTY Right 1999   TOTAL HIP ARTHROPLASTY Left 2008   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       Home Medications    Prior to Admission medications   Medication Sig Start Date End Date Taking? Authorizing Provider  chlorhexidine (PERIDEX) 0.12 % solution Use as directed 15 mLs in the mouth or throat 2 (two) times daily. 09/02/21  Yes Volney American, PA-C  lidocaine (XYLOCAINE) 2 % solution Use as directed 10 mLs in the mouth or throat every 3 (three) hours as needed for mouth pain. 09/02/21  Yes Volney American, PA-C  aspirin EC 81 MG tablet Take 81 mg by mouth daily.    [provider]  atorvastatin (LIPITOR) 80 MG tablet TAKE 1 TABLET BY MOUTH EVERY DAY 06/08/21   Satira Sark, MD  diltiazem (DILT-XR) 240 MG 24 hr capsule TAKE 1 CAPSULE BY MOUTH EVERY DAY 12/20/20   Satira Sark, MD  furosemide  (LASIX) 40 MG tablet TAKE ONE DAILY. MAY TAKE AN EXTRA TABLET AS NEEDED FOR EDEMA. 05/17/21   Satira Sark, MD  HYDROcodone-acetaminophen (NORCO) 5-325 MG tablet Take 1 tablet by mouth 3 (three) times daily as needed for moderate pain. 09/08/20   Elvia Collum M, DO  metoprolol succinate (TOPROL-XL) 100 MG 24 hr tablet TAKE 1 TABLET BY MOUTH DAILY. TAKE WITH OR IMMEDIATELY FOLLOWING A MEAL. 04/10/21   Satira Sark, MD  nitroGLYCERIN (NITROSTAT) 0.4 MG SL tablet Place 1 tablet (0.4 mg total) under the tongue every 5 (five) minutes x 3 doses as needed for chest pain (if no relief after 3rd dose, proceed to the ED for an evaluation or call 911). 04/21/20   Verta Ellen., NP  warfarin (COUMADIN) 7.5 MG tablet TAKE  1 TABLET DAILY AS DIRECTED BY COUMADIN CLINIC 03/30/21   Satira Sark, MD    Family History Family History  Problem Relation Age of Onset   Heart attack Brother    Colon cancer Neg Hx     Social History Social History   Tobacco Use   Smoking status: Former    Packs/day: 0.50    Years: 2.00    Pack years: 1.00    Types: Cigarettes    Quit date: 09/03/1966    Years since quitting: 55.0   Smokeless tobacco: Never  Vaping Use   Vaping Use: Never used  Substance Use Topics   Alcohol use: Yes    Alcohol/week: 11.0 standard drinks    Types: 7 Glasses of wine, 4 Cans of beer per week    Comment: a glass of wine with dinner/ beer on the weekend   Drug use: No     Allergies   Augmentin [amoxicillin-pot clavulanate]   Review of Systems Review of Systems PER HPI   Physical Exam Triage Vital Signs ED Triage Vitals [09/02/21 1304]  Enc Vitals Group     BP 119/73     Pulse Rate 73     Resp 18     Temp 98 F (36.7 C)     Temp Source Oral     SpO2 94 %     Weight      Height      Head Circumference      Peak Flow      Pain Score      Pain Loc      Pain Edu?      Excl. in [place]?    No data found.  Updated Vital Signs BP 119/73 (BP Location: Right  Arm)    Pulse 73    Temp 98 F (36.7 C) (Oral)    Resp 18    SpO2 94%   Visual Acuity Right Eye Distance:   Left Eye Distance:   Bilateral Distance:    Right Eye Near:   Left Eye Near:    Bilateral Near:     Physical Exam Vitals and nursing note reviewed.  Constitutional:      Appearance: Normal appearance.  HENT:     Head: Atraumatic.     Mouth/Throat:     Mouth: Mucous membranes are moist.     Pharynx: Posterior oropharyngeal erythema present. No oropharyngeal exudate.     Comments: Tongue diffusely erythematous, small ulceration present toward distal tongue and patchy erythema to hard palate.  Cheilitis present bilateral corners Eyes:     Extraocular Movements: Extraocular movements intact.     Conjunctiva/sclera: Conjunctivae normal.  Cardiovascular:     Rate and Rhythm: Normal rate.  Pulmonary:     Effort: Pulmonary effort is normal. No respiratory distress.  Musculoskeletal:     Cervical back: Normal range of motion and neck supple.     Comments: ROM at baseline  Skin:    General: Skin is warm and dry.  Neurological:     General: No focal deficit present.     Mental Status: He is oriented to person, place, and time.  Psychiatric:        Mood and Affect: Mood normal.        Thought Content: Thought content normal.        Judgment: Judgment normal.     UC Treatments / Results  Labs (all labs ordered are listed, but only abnormal results are displayed) Labs Reviewed - No data to display  EKG   Radiology No results found.  Procedures Procedures (including critical care time)  Medications Ordered in  UC Medications - No data to display  Initial Impression / Assessment and Plan / UC Course  I have reviewed the triage vital signs and the nursing notes.  Pertinent labs & imaging results that were available during my care of the patient were reviewed by me and considered in my medical decision making (see chart for details).     No evidence of bacterial  infection or yeast, will treat with Peridex, viscous lidocaine and follow-up with dentist/PCP next week for recheck. Final Clinical Impressions(s) / UC Diagnoses   Final diagnoses:  Mouth lesion   Discharge Instructions   None    ED Prescriptions     Medication Sig Dispense Auth. Provider   chlorhexidine (PERIDEX) 0.12 % solution Use as directed 15 mLs in the mouth or throat 2 (two) times daily. 120 mL Volney American, PA-C   lidocaine (XYLOCAINE) 2 % solution Use as directed 10 mLs in the mouth or throat every 3 (three) hours as needed for mouth pain. 100 mL Volney American, [place]      PDMP not reviewed this encounter.   Volney American, [place] 09/02/21 1333

## 2021-10-02 NOTE — Progress Notes (Signed)
Cardiology Office Note  Date: 10/03/2021   ID: SWAN ZAYED, DOB 05-07-1941, MRN [id number]  PCP:  Erven Colla, DO  Cardiologist:  Rozann Lesches, MD Electrophysiologist:  None   Chief Complaint  Patient presents with   Cardiac follow-up    History of Present Illness: Leonard Cisneros is an 81 y.o. male last seen in July 2022.  He is here for a routine visit.  Reports NYHA class II dyspnea with typical activities, no nitroglycerin use described.  He remains on Coumadin with follow-up in anticoagulation clinic.  Last INR was 2.0.  He does not report any spontaneous bleeding problems.  No regular sense of palpitations.  Last echocardiogram was in July 2022 at which point LVEF was 55 to 60% with moderate diastolic dysfunction, also moderate calcific aortic stenosis with mean gradient approximately 19 mmHg.  We discussed getting a follow-up study in about 6 months.  I reviewed the remainder of his medications which are noted below.  Past Medical History:  Diagnosis Date   Aortic stenosis    Atrial flutter ([place])    a. s/p remote ablation by Dr. [name].   CAD (coronary artery disease)    a. s/p CABG 2003. b. 01/2016: unstable angina - s/p BMS to SVG-ramus intermediate, patent LIMA-mLAD, patent RIMA-dRCA.    Chronic lower back pain    Essential hypertension    Hyperlipidemia    Ischemic cardiomyopathy    a. Cath 01/2016 -  EF 50-55% with mild distal inferior hypocontractility.   Peripheral neuropathy 04/04/2014   Permanent atrial fibrillation [facility])     Past Surgical History:  Procedure Laterality Date   BACK SURGERY     CARDIAC CATHETERIZATION  ~ 2000; 2003   CARDIAC CATHETERIZATION N/A 02/01/2016   Procedure: Left Heart Cath and Cors/Grafts Angiography;  Surgeon: [name], MD;  Location: [place] CV LAB;  Service: Cardiovascular;  Laterality: N/A;   CARDIAC CATHETERIZATION N/A 02/01/2016   Procedure: Coronary Stent Intervention;  Surgeon: [name],  MD;  Location: [place] CV LAB;  Service: Cardiovascular;  Laterality: N/A;   CATARACT EXTRACTION W/PHACO Left 02/09/2019   Procedure: CATARACT EXTRACTION PHACO AND INTRAOCULAR LENS PLACEMENT (IOC);  Surgeon: [name], MD;  Location: [facility];  Service: Ophthalmology;  Laterality: Left;  CDE: 27.70   CATARACT EXTRACTION W/PHACO Right 02/23/2019   Procedure: CATARACT EXTRACTION PHACO AND INTRAOCULAR LENS PLACEMENT RIGHT EYE;  Surgeon: [name], MD;  Location: [facility];  Service: Ophthalmology;  Laterality: Right;  CDE: 10.15   COLONOSCOPY  06/04/2002   Normal colon and rectum   COLONOSCOPY  07/08/2012   Procedure: COLONOSCOPY;  Surgeon: [name], MD;  [facility];  Service: Endoscopy;  Laterality: N/A;  11:30   COLONOSCOPY N/A 10/02/2017   Procedure: COLONOSCOPY;  Surgeon: [name], MD;  [facility];  Service: Endoscopy;  Laterality: N/A;  10:30am   CORONARY ANGIOPLASTY WITH STENT PLACEMENT  02/01/2016   CORONARY ARTERY BYPASS GRAFT  2003   LIMA to LAD,LIMA to distal RCA,SVG to ramus intermediate vessel.   FEMORAL REVISION Right 03/30/2019   Procedure: Femoral revision right total hip arthroplasty;  Surgeon: [name], MD;  Location: [facility];  Service: Orthopedics;  Laterality: Right;  140min   FOREIGN BODY REMOVAL Right 04/02/2014   Procedure: FOREIGN BODY REMOVAL RIGHT FOOT;  Surgeon: [name], MD;  Location: [facility];  Service: General;  Laterality: Right;   JOINT REPLACEMENT  JOINT REPLACEMENT     LUMBAR DISC SURGERY     REVISION TOTAL HIP ARTHROPLASTY Bilateral 2005-2012   right-left   SHOULDER OPEN ROTATOR CUFF REPAIR Right    TOTAL HIP ARTHROPLASTY Right 1999   TOTAL HIP ARTHROPLASTY Left 2008   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    Current  Outpatient Medications  Medication Sig Dispense Refill   aspirin EC 81 MG tablet Take 81 mg by mouth daily.     atorvastatin (LIPITOR) 80 MG tablet TAKE 1 TABLET BY MOUTH EVERY DAY 90 tablet 1   chlorhexidine (PERIDEX) 0.12 % solution Use as directed 15 mLs in the mouth or throat 2 (two) times daily. 120 mL 0   diltiazem (DILT-XR) 240 MG 24 hr capsule TAKE 1 CAPSULE BY MOUTH EVERY DAY 90 capsule 3   furosemide (LASIX) 40 MG tablet TAKE ONE DAILY. MAY TAKE AN EXTRA TABLET AS NEEDED FOR EDEMA. 225 tablet 3   HYDROcodone-acetaminophen (NORCO) 5-325 MG tablet Take 1 tablet by mouth 3 (three) times daily as needed for moderate pain. 30 tablet 0   lidocaine (XYLOCAINE) 2 % solution Use as directed 10 mLs in the mouth or throat every 3 (three) hours as needed for mouth pain. 100 mL 0   metoprolol succinate (TOPROL-XL) 100 MG 24 hr tablet TAKE 1 TABLET BY MOUTH DAILY. TAKE WITH OR IMMEDIATELY FOLLOWING A MEAL. 90 tablet 1   nitroGLYCERIN (NITROSTAT) 0.4 MG SL tablet Place 1 tablet (0.4 mg total) under the tongue every 5 (five) minutes x 3 doses as needed for chest pain (if no relief after 3rd dose, proceed to the ED for an evaluation or call 911). 25 tablet 3   warfarin (COUMADIN) 7.5 MG tablet TAKE  1 TABLET DAILY AS DIRECTED BY COUMADIN CLINIC 90 tablet 1   No current facility-administered medications for this visit.   Allergies:  Augmentin [amoxicillin-pot clavulanate]   ROS: No orthopnea or PND.  Physical Exam: VS:  BP (!) 116/58    Pulse 76    Ht 5\' 11"  (1.803 m)    Wt 223 lb (101.2 kg)    SpO2 95%    BMI 31.10 kg/m , BMI Body mass index is 31.1 kg/m.  Wt Readings from Last 3 Encounters:  10/03/21 223 lb (101.2 kg)  03/30/21 226 lb 12.8 oz (102.9 kg)  11/10/20 234 lb 12.8 oz (106.5 kg)    General: Patient appears comfortable at rest. HEENT: Conjunctiva and lids normal, wearing a mask. Neck: Supple, no elevated JVP or carotid bruits, no thyromegaly. Lungs: Clear to auscultation,  nonlabored breathing at rest. Cardiac: Irregularly irregular, no S3, 3/6 systolic murmur, no pericardial rub. Extremities: No pitting edema.  ECG:  An ECG dated 03/30/2021 was personally reviewed today and demonstrated:  Atrial fibrillation with nonspecific ST changes.  Recent Labwork:    Component Value Date/Time   CHOL 142 07/19/2020 1036   TRIG 48 07/19/2020 1036   HDL 100 07/19/2020 1036   CHOLHDL 1.4 07/19/2020 1036   CHOLHDL 1.7 10/23/2018 0050   VLDL 11 10/23/2018 0050   LDLCALC 31 07/19/2020 1036    Other Studies Reviewed Today:  Echocardiogram 03/23/2021:  1. Left ventricular ejection fraction, by estimation, is 55 to 60%. The  left ventricle has normal function. Left ventricular endocardial border  not optimally defined to evaluate regional wall motion.  There is mild left  ventricular hypertrophy. Left  ventricular diastolic parameters are consistent with Grade II diastolic  dysfunction (pseudonormalization). Elevated left atrial pressure.   2. Right ventricular systolic function is normal. The right ventricular  size is normal. Tricuspid regurgitation signal is inadequate for assessing  PA pressure.   3. Left atrial size was severely dilated.   4. Right atrial size was moderately dilated.   5. The mitral valve is normal in structure. Mild mitral valve  regurgitation. No evidence of mitral stenosis.   6. The aortic valve has an indeterminant number of cusps. There is  moderate calcification of the aortic valve. There is moderate thickening  of the aortic valve. Aortic valve regurgitation is not visualized.  Moderate aortic valve stenosis. Aortic valve  mean gradient measures 18.5 mmHg. Aortic valve peak gradient measures 32.0  mmHg. Aortic valve area, by VTI measures 1.25 cm.   Assessment and Plan:  1.  Multivessel CAD status post CABG in 2003 with subsequent BMS to the SVG to ramus intermedius in 2017.  He does not describe any angina or nitroglycerin use.   Continue aspirin, Toprol-XL, and Lipitor.  2.  Permanent atrial fibrillation with CHA2DS2-VASc score of 4.  He does not describe palpitations and continues on diltiazem CD and Toprol-XL with good heart rate control.  Continue Coumadin for stroke prophylaxis, he has declined a switch to DOAC.  Recent INR therapeutic, no spontaneous bleeding problems reported.  3.  Moderate calcific aortic stenosis by echocardiogram in July 2022, mean gradient approximately 19 mmHg.  Obtain follow-up study for visit in 6 months.  4.  Mixed hyperlipidemia, check FLP.  Medication Adjustments/Labs and Tests Ordered: Current medicines are reviewed at length with the patient today.  Concerns regarding medicines are outlined above.   Tests Ordered: Orders Placed This Encounter  Procedures   Lipid panel   Basic metabolic panel   ECHOCARDIOGRAM COMPLETE    Medication Changes: No orders of the defined types were placed in this encounter.   Disposition:  Follow up  6 months.  Signed, Satira Sark, MD, [facility] 10/03/2021 10:00 AM    [facility] [place], [place], [place] [address] Phone: [phone]; Fax: [phone]

## 2021-10-03 ENCOUNTER — Ambulatory Visit (INDEPENDENT_AMBULATORY_CARE_PROVIDER_SITE_OTHER): Payer: Medicare HMO | Admitting: *Deleted

## 2021-10-03 ENCOUNTER — Ambulatory Visit: Payer: Medicare HMO | Admitting: Cardiology

## 2021-10-03 ENCOUNTER — Encounter: Payer: Self-pay | Admitting: Cardiology

## 2021-10-03 VITALS — BP 116/58 | HR 76 | Ht 71.0 in | Wt 223.0 lb

## 2021-10-03 DIAGNOSIS — Z5181 Encounter for therapeutic drug level monitoring: Secondary | ICD-10-CM

## 2021-10-03 DIAGNOSIS — E785 Hyperlipidemia, unspecified: Secondary | ICD-10-CM

## 2021-10-03 DIAGNOSIS — Z79899 Other long term (current) drug therapy: Secondary | ICD-10-CM

## 2021-10-03 DIAGNOSIS — I482 Chronic atrial fibrillation, unspecified: Secondary | ICD-10-CM | POA: Diagnosis not present

## 2021-10-03 DIAGNOSIS — I4821 Permanent atrial fibrillation: Secondary | ICD-10-CM

## 2021-10-03 DIAGNOSIS — I25119 Atherosclerotic heart disease of native coronary artery with unspecified angina pectoris: Secondary | ICD-10-CM

## 2021-10-03 DIAGNOSIS — I350 Nonrheumatic aortic (valve) stenosis: Secondary | ICD-10-CM

## 2021-10-03 DIAGNOSIS — Z7901 Long term (current) use of anticoagulants: Secondary | ICD-10-CM

## 2021-10-03 LAB — POCT INR: INR: 2 (ref 2.0–3.0)

## 2021-10-03 NOTE — Patient Instructions (Addendum)
Medication Instructions:  Your physician recommends that you continue on your current medications as directed. Please refer to the Current Medication list given to you today.  Labwork: Your physician recommends that you return for a FASTING lipid and BMET profile as soon as possible. Please do not eat or drink for at least 8 hours when you have this done. You may take your medications that morning with a sip of water.  Testing/Procedures: Your physician has requested that you have an echocardiogram in 6 months just before your next visit. Echocardiography is a painless test that uses sound waves to create images of your heart. It provides your doctor with information about the size and shape of your heart and how well your hearts chambers and valves are working. This procedure takes approximately one hour. There are no restrictions for this procedure.  Follow-Up: Your physician recommends that you schedule a follow-up appointment in: 6 months  Any Other Special Instructions Will Be Listed Below (If Applicable).  If you need a refill on your cardiac medications before your next appointment, please call your pharmacy.

## 2021-10-03 NOTE — Patient Instructions (Signed)
Continue warfarin 1 tablet daily.   Recheck in 4 weeks   

## 2021-10-04 DIAGNOSIS — E785 Hyperlipidemia, unspecified: Secondary | ICD-10-CM | POA: Diagnosis not present

## 2021-10-04 DIAGNOSIS — Z79899 Other long term (current) drug therapy: Secondary | ICD-10-CM | POA: Diagnosis not present

## 2021-10-04 DIAGNOSIS — I350 Nonrheumatic aortic (valve) stenosis: Secondary | ICD-10-CM | POA: Diagnosis not present

## 2021-10-04 DIAGNOSIS — I4821 Permanent atrial fibrillation: Secondary | ICD-10-CM | POA: Diagnosis not present

## 2021-10-04 DIAGNOSIS — I509 Heart failure, unspecified: Secondary | ICD-10-CM | POA: Diagnosis not present

## 2021-10-05 ENCOUNTER — Other Ambulatory Visit: Payer: Self-pay | Admitting: Cardiology

## 2021-10-05 LAB — BASIC METABOLIC PANEL
BUN/Creatinine Ratio: 13 (ref 10–24)
BUN: 10 mg/dL (ref 8–27)
CO2: 23 mmol/L (ref 20–29)
Calcium: 9.3 mg/dL (ref 8.6–10.2)
Chloride: 102 mmol/L (ref 96–106)
Creatinine, Ser: 0.76 mg/dL (ref 0.76–1.27)
Glucose: 105 mg/dL — ABNORMAL HIGH (ref 70–99)
Potassium: 4.2 mmol/L (ref 3.5–5.2)
Sodium: 138 mmol/L (ref 134–144)
eGFR: 91 mL/min/{1.73_m2} (ref >59)

## 2021-10-05 LAB — LIPID PANEL
Chol/HDL Ratio: 1.5 ratio (ref 0.0–5.0)
Cholesterol, Total: 140 mg/dL (ref 100–199)
HDL: 93 mg/dL (ref >39)
LDL Chol Calc (NIH): 35 mg/dL (ref 0–99)
Triglycerides: 53 mg/dL (ref 0–149)
VLDL Cholesterol Cal: 12 mg/dL (ref 5–40)

## 2021-10-31 ENCOUNTER — Ambulatory Visit (INDEPENDENT_AMBULATORY_CARE_PROVIDER_SITE_OTHER): Payer: Medicare HMO | Admitting: *Deleted

## 2021-10-31 DIAGNOSIS — Z7901 Long term (current) use of anticoagulants: Secondary | ICD-10-CM | POA: Diagnosis not present

## 2021-10-31 DIAGNOSIS — I482 Chronic atrial fibrillation, unspecified: Secondary | ICD-10-CM

## 2021-10-31 DIAGNOSIS — Z5181 Encounter for therapeutic drug level monitoring: Secondary | ICD-10-CM | POA: Diagnosis not present

## 2021-10-31 LAB — POCT INR: INR: 1.7 — AB (ref 2.0–3.0)

## 2021-10-31 NOTE — Patient Instructions (Signed)
Increase warfarin 1 tablet daily except 1 1/2 tablets on Tuesdays Recheck in 3 weeks

## 2021-11-11 ENCOUNTER — Encounter (HOSPITAL_COMMUNITY): Payer: Self-pay

## 2021-11-11 ENCOUNTER — Other Ambulatory Visit (HOSPITAL_COMMUNITY): Payer: Medicare HMO

## 2021-11-11 ENCOUNTER — Emergency Department (HOSPITAL_COMMUNITY): Payer: Medicare HMO

## 2021-11-11 ENCOUNTER — Inpatient Hospital Stay (HOSPITAL_COMMUNITY)
Admission: EM | Admit: 2021-11-11 | Discharge: 2021-11-13 | DRG: 291 | Disposition: A | Discharge status: Home / Self Care | Payer: Medicare HMO | Attending: Family Medicine | Admitting: Family Medicine

## 2021-11-11 ENCOUNTER — Other Ambulatory Visit: Payer: Self-pay

## 2021-11-11 DIAGNOSIS — I482 Chronic atrial fibrillation, unspecified: Secondary | ICD-10-CM | POA: Diagnosis present

## 2021-11-11 DIAGNOSIS — I2510 Atherosclerotic heart disease of native coronary artery without angina pectoris: Secondary | ICD-10-CM | POA: Diagnosis not present

## 2021-11-11 DIAGNOSIS — Z8679 Personal history of other diseases of the circulatory system: Secondary | ICD-10-CM

## 2021-11-11 DIAGNOSIS — E785 Hyperlipidemia, unspecified: Secondary | ICD-10-CM | POA: Diagnosis present

## 2021-11-11 DIAGNOSIS — D638 Anemia in other chronic diseases classified elsewhere: Secondary | ICD-10-CM | POA: Diagnosis present

## 2021-11-11 DIAGNOSIS — E871 Hypo-osmolality and hyponatremia: Secondary | ICD-10-CM | POA: Diagnosis present

## 2021-11-11 DIAGNOSIS — I5020 Unspecified systolic (congestive) heart failure: Secondary | ICD-10-CM

## 2021-11-11 DIAGNOSIS — I509 Heart failure, unspecified: Secondary | ICD-10-CM

## 2021-11-11 DIAGNOSIS — I5033 Acute on chronic diastolic (congestive) heart failure: Secondary | ICD-10-CM | POA: Diagnosis present

## 2021-11-11 DIAGNOSIS — Z955 Presence of coronary angioplasty implant and graft: Secondary | ICD-10-CM

## 2021-11-11 DIAGNOSIS — F1010 Alcohol abuse, uncomplicated: Secondary | ICD-10-CM | POA: Diagnosis not present

## 2021-11-11 DIAGNOSIS — R072 Precordial pain: Principal | ICD-10-CM

## 2021-11-11 DIAGNOSIS — Z87891 Personal history of nicotine dependence: Secondary | ICD-10-CM

## 2021-11-11 DIAGNOSIS — I739 Peripheral vascular disease, unspecified: Secondary | ICD-10-CM | POA: Diagnosis present

## 2021-11-11 DIAGNOSIS — L03115 Cellulitis of right lower limb: Secondary | ICD-10-CM | POA: Diagnosis not present

## 2021-11-11 DIAGNOSIS — D539 Nutritional anemia, unspecified: Secondary | ICD-10-CM | POA: Diagnosis not present

## 2021-11-11 DIAGNOSIS — I272 Pulmonary hypertension, unspecified: Secondary | ICD-10-CM | POA: Diagnosis not present

## 2021-11-11 DIAGNOSIS — Z880 Allergy status to penicillin: Secondary | ICD-10-CM

## 2021-11-11 DIAGNOSIS — Z7982 Long term (current) use of aspirin: Secondary | ICD-10-CM | POA: Diagnosis not present

## 2021-11-11 DIAGNOSIS — R2243 Localized swelling, mass and lump, lower limb, bilateral: Secondary | ICD-10-CM | POA: Diagnosis not present

## 2021-11-11 DIAGNOSIS — L03116 Cellulitis of left lower limb: Secondary | ICD-10-CM | POA: Diagnosis not present

## 2021-11-11 DIAGNOSIS — R0789 Other chest pain: Secondary | ICD-10-CM | POA: Diagnosis present

## 2021-11-11 DIAGNOSIS — R600 Localized edema: Secondary | ICD-10-CM

## 2021-11-11 DIAGNOSIS — I110 Hypertensive heart disease with heart failure: Secondary | ICD-10-CM | POA: Diagnosis not present

## 2021-11-11 DIAGNOSIS — I255 Ischemic cardiomyopathy: Secondary | ICD-10-CM | POA: Diagnosis present

## 2021-11-11 DIAGNOSIS — G8929 Other chronic pain: Secondary | ICD-10-CM | POA: Diagnosis not present

## 2021-11-11 DIAGNOSIS — Z7901 Long term (current) use of anticoagulants: Secondary | ICD-10-CM

## 2021-11-11 DIAGNOSIS — I4821 Permanent atrial fibrillation: Secondary | ICD-10-CM | POA: Diagnosis present

## 2021-11-11 DIAGNOSIS — Z8249 Family history of ischemic heart disease and other diseases of the circulatory system: Secondary | ICD-10-CM

## 2021-11-11 DIAGNOSIS — R079 Chest pain, unspecified: Secondary | ICD-10-CM | POA: Diagnosis not present

## 2021-11-11 DIAGNOSIS — I10 Essential (primary) hypertension: Secondary | ICD-10-CM

## 2021-11-11 DIAGNOSIS — I5043 Acute on chronic combined systolic (congestive) and diastolic (congestive) heart failure: Secondary | ICD-10-CM | POA: Diagnosis not present

## 2021-11-11 DIAGNOSIS — G629 Polyneuropathy, unspecified: Secondary | ICD-10-CM | POA: Diagnosis not present

## 2021-11-11 DIAGNOSIS — I350 Nonrheumatic aortic (valve) stenosis: Secondary | ICD-10-CM | POA: Diagnosis not present

## 2021-11-11 DIAGNOSIS — R2689 Other abnormalities of gait and mobility: Secondary | ICD-10-CM | POA: Diagnosis not present

## 2021-11-11 DIAGNOSIS — M545 Low back pain, unspecified: Secondary | ICD-10-CM | POA: Diagnosis present

## 2021-11-11 DIAGNOSIS — Z20822 Contact with and (suspected) exposure to covid-19: Secondary | ICD-10-CM | POA: Diagnosis not present

## 2021-11-11 DIAGNOSIS — Z79899 Other long term (current) drug therapy: Secondary | ICD-10-CM

## 2021-11-11 DIAGNOSIS — L0390 Cellulitis, unspecified: Secondary | ICD-10-CM

## 2021-11-11 DIAGNOSIS — R262 Difficulty in walking, not elsewhere classified: Secondary | ICD-10-CM

## 2021-11-11 DIAGNOSIS — Z951 Presence of aortocoronary bypass graft: Secondary | ICD-10-CM

## 2021-11-11 DIAGNOSIS — Z96643 Presence of artificial hip joint, bilateral: Secondary | ICD-10-CM | POA: Diagnosis present

## 2021-11-11 LAB — TSH: TSH: 1.789 u[IU]/mL (ref 0.350–4.500)

## 2021-11-11 LAB — PROTIME-INR
INR: 2.1 — ABNORMAL HIGH (ref 0.8–1.2)
Prothrombin Time: 23.6 seconds — ABNORMAL HIGH (ref 11.4–15.2)

## 2021-11-11 LAB — COMPREHENSIVE METABOLIC PANEL
ALT: 31 U/L (ref 0–44)
AST: 30 U/L (ref 15–41)
Albumin: 3.9 g/dL (ref 3.5–5.0)
Alkaline Phosphatase: 209 U/L — ABNORMAL HIGH (ref 38–126)
Anion gap: 9 (ref 5–15)
BUN: 11 mg/dL (ref 8–23)
CO2: 24 mmol/L (ref 22–32)
Calcium: 8.6 mg/dL — ABNORMAL LOW (ref 8.9–10.3)
Chloride: 98 mmol/L (ref 98–111)
Creatinine, Ser: 0.66 mg/dL (ref 0.61–1.24)
GFR, Estimated: >60 mL/min (ref >60)
Glucose, Bld: 115 mg/dL — ABNORMAL HIGH (ref 70–99)
Potassium: 3.5 mmol/L (ref 3.5–5.1)
Sodium: 131 mmol/L — ABNORMAL LOW (ref 135–145)
Total Bilirubin: 1.3 mg/dL — ABNORMAL HIGH (ref 0.3–1.2)
Total Protein: 7.1 g/dL (ref 6.5–8.1)

## 2021-11-11 LAB — TROPONIN I (HIGH SENSITIVITY)
Troponin I (High Sensitivity): 12 ng/L (ref <18)
Troponin I (High Sensitivity): 12 ng/L (ref <18)

## 2021-11-11 LAB — BRAIN NATRIURETIC PEPTIDE: B Natriuretic Peptide: 152 pg/mL — ABNORMAL HIGH (ref 0.0–100.0)

## 2021-11-11 LAB — CBC
HCT: 32.4 % — ABNORMAL LOW (ref 39.0–52.0)
Hemoglobin: 10.8 g/dL — ABNORMAL LOW (ref 13.0–17.0)
MCH: 36 pg — ABNORMAL HIGH (ref 26.0–34.0)
MCHC: 33.3 g/dL (ref 30.0–36.0)
MCV: 108 fL — ABNORMAL HIGH (ref 80.0–100.0)
Platelets: 209 10*3/uL (ref 150–400)
RBC: 3 MIL/uL — ABNORMAL LOW (ref 4.22–5.81)
RDW: 16.1 % — ABNORMAL HIGH (ref 11.5–15.5)
WBC: 6.8 10*3/uL (ref 4.0–10.5)
nRBC: 0.4 % — ABNORMAL HIGH (ref 0.0–0.2)

## 2021-11-11 LAB — RESP PANEL BY RT-PCR (FLU A&B, COVID) ARPGX2
Influenza A by PCR: NEGATIVE
Influenza B by PCR: NEGATIVE
SARS Coronavirus 2 by RT PCR: NEGATIVE

## 2021-11-11 LAB — FOLATE: Folate: 11.2 ng/mL (ref >5.9)

## 2021-11-11 LAB — FERRITIN: Ferritin: 423 ng/mL — ABNORMAL HIGH (ref 24–336)

## 2021-11-11 LAB — VITAMIN B12: Vitamin B-12: 612 pg/mL (ref 180–914)

## 2021-11-11 MED ORDER — ACETAMINOPHEN 325 MG PO TABS: 650.0000 mg | ORAL_TABLET | Freq: Four times a day (QID) | ORAL | Status: DC | PRN

## 2021-11-11 MED ORDER — THIAMINE HCL 100 MG/ML IJ SOLN: 100.0000 mg | Freq: Every day | INTRAMUSCULAR | Status: DC

## 2021-11-11 MED ORDER — ASPIRIN EC 81 MG PO TBEC: 81.0000 mg | DELAYED_RELEASE_TABLET | Freq: Every day | ORAL | Status: DC

## 2021-11-11 MED ORDER — METOPROLOL SUCCINATE ER 100 MG PO TB24
100.0000 mg | ORAL_TABLET | Freq: Every day | ORAL | Status: DC
  Administered 2021-11-12 – 2021-11-13 (×2): 100 mg via ORAL
  Filled 2021-11-11 (×2): qty 1

## 2021-11-11 MED ORDER — ACETAMINOPHEN 650 MG RE SUPP: 650.0000 mg | Freq: Four times a day (QID) | RECTAL | Status: DC | PRN

## 2021-11-11 MED ORDER — POLYETHYLENE GLYCOL 3350 17 G PO PACK: 17.0000 g | PACK | Freq: Every day | ORAL | Status: DC | PRN

## 2021-11-11 MED ORDER — FUROSEMIDE 10 MG/ML IJ SOLN: 40.0000 mg | Freq: Every day | INTRAMUSCULAR | Status: DC

## 2021-11-11 MED ORDER — TRAZODONE HCL 50 MG PO TABS
50.0000 mg | ORAL_TABLET | Freq: Every evening | ORAL | Status: DC | PRN
  Administered 2021-11-12: 50 mg via ORAL
  Filled 2021-11-11: qty 1

## 2021-11-11 MED ORDER — HYDROCODONE-ACETAMINOPHEN 5-325 MG PO TABS
1.0000 | ORAL_TABLET | Freq: Three times a day (TID) | ORAL | Status: DC | PRN
Start: 2021-11-11 — End: 2021-11-13

## 2021-11-11 MED ORDER — ONDANSETRON HCL 4 MG/2ML IJ SOLN
4.0000 mg | Freq: Four times a day (QID) | INTRAMUSCULAR | Status: DC | PRN
Start: 2021-11-11 — End: 2021-11-13

## 2021-11-11 MED ORDER — ONDANSETRON HCL 4 MG PO TABS: 4.0000 mg | ORAL_TABLET | Freq: Four times a day (QID) | ORAL | Status: DC | PRN

## 2021-11-11 MED ORDER — LORAZEPAM 1 MG PO TABS: 1.0000 mg | ORAL_TABLET | ORAL | Status: DC | PRN

## 2021-11-11 MED ORDER — SODIUM CHLORIDE 0.9% FLUSH: 3.0000 mL | INTRAVENOUS | Status: DC | PRN

## 2021-11-11 MED ORDER — NITROGLYCERIN 0.4 MG SL SUBL: 0.4000 mg | SUBLINGUAL_TABLET | SUBLINGUAL | Status: DC | PRN

## 2021-11-11 MED ORDER — BISACODYL 10 MG RE SUPP: 10.0000 mg | Freq: Every day | RECTAL | Status: DC | PRN

## 2021-11-11 MED ORDER — SODIUM CHLORIDE 0.9% FLUSH
3.0000 mL | Freq: Two times a day (BID) | INTRAVENOUS | Status: DC
  Administered 2021-11-12 – 2021-11-13 (×3): 3 mL via INTRAVENOUS

## 2021-11-11 MED ORDER — FOLIC ACID 1 MG PO TABS
1.0000 mg | ORAL_TABLET | Freq: Every day | ORAL | Status: DC
  Administered 2021-11-11 – 2021-11-13 (×3): 1 mg via ORAL
  Filled 2021-11-11 (×3): qty 1

## 2021-11-11 MED ORDER — FUROSEMIDE 10 MG/ML IJ SOLN
40.0000 mg | Freq: Two times a day (BID) | INTRAMUSCULAR | Status: DC
  Administered 2021-11-11 – 2021-11-13 (×4): 40 mg via INTRAVENOUS
  Filled 2021-11-11 (×4): qty 4

## 2021-11-11 MED ORDER — DILTIAZEM HCL ER COATED BEADS 240 MG PO CP24
240.0000 mg | ORAL_CAPSULE | Freq: Every day | ORAL | Status: DC
  Administered 2021-11-12 – 2021-11-13 (×2): 240 mg via ORAL
  Filled 2021-11-11 (×2): qty 1

## 2021-11-11 MED ORDER — FUROSEMIDE 10 MG/ML IJ SOLN
40.0000 mg | Freq: Once | INTRAMUSCULAR | Status: AC
  Administered 2021-11-11: 40 mg via INTRAVENOUS
  Filled 2021-11-11: qty 4

## 2021-11-11 MED ORDER — MELATONIN 5 MG PO TABS
10.0000 mg | ORAL_TABLET | Freq: Every evening | ORAL | Status: DC | PRN
Start: 2021-11-11 — End: 2021-11-12
  Administered 2021-11-11: 10 mg via ORAL
  Filled 2021-11-11 (×2): qty 2

## 2021-11-11 MED ORDER — LORAZEPAM 2 MG/ML IJ SOLN: 1.0000 mg | INTRAMUSCULAR | Status: DC | PRN

## 2021-11-11 MED ORDER — DILTIAZEM HCL ER 240 MG PO CP24: 240.0000 mg | ORAL_CAPSULE | Freq: Every day | ORAL | Status: DC

## 2021-11-11 MED ORDER — WARFARIN - PHARMACIST DOSING INPATIENT: Freq: Every day | Status: DC

## 2021-11-11 MED ORDER — ATORVASTATIN CALCIUM 80 MG PO TABS
80.0000 mg | ORAL_TABLET | Freq: Every day | ORAL | Status: DC
  Administered 2021-11-12 – 2021-11-13 (×2): 80 mg via ORAL
  Filled 2021-11-11 (×2): qty 1

## 2021-11-11 MED ORDER — THIAMINE HCL 100 MG PO TABS
100.0000 mg | ORAL_TABLET | Freq: Every day | ORAL | Status: DC
  Administered 2021-11-11 – 2021-11-13 (×3): 100 mg via ORAL
  Filled 2021-11-11 (×3): qty 1

## 2021-11-11 MED ORDER — SODIUM CHLORIDE 0.9 % IV SOLN: 250.0000 mL | INTRAVENOUS | Status: DC | PRN

## 2021-11-11 MED ORDER — ADULT MULTIVITAMIN W/MINERALS CH
1.0000 | ORAL_TABLET | Freq: Every day | ORAL | Status: DC
  Administered 2021-11-12 – 2021-11-13 (×2): 1 via ORAL
  Filled 2021-11-11 (×2): qty 1

## 2021-11-11 MED ORDER — SODIUM CHLORIDE 0.9% FLUSH
3.0000 mL | Freq: Two times a day (BID) | INTRAVENOUS | Status: DC
  Administered 2021-11-11 – 2021-11-13 (×4): 3 mL via INTRAVENOUS

## 2021-11-11 NOTE — Consult Note (Signed)
Cardiology Consultation:   Patient ID: Leonard Cisneros MRN: [id number]; DOB: 1941-02-17  Admit date: 11/11/2021 Date of Consult: 11/11/2021  PCP:  Annalee Genta, DO   [facility] Providers Cardiologist:  [name], MD        Patient Profile:   Leonard Cisneros is a 81 y.o. male with a hx of coronary artery disease prior bypass surgery 2003 with PCI 2017,, moderate aortic stenosis, atrial fibrillation-permanent on anticoagulation w warfarin who is being seen 11/11/2021 for the evaluation of CP  at the request of [place].  Tn neg x 2     History of Present Illness:   Leonard Cisneros is transferred from [facility] with a chest pain syndrome that began on Tuesday.  He notes that his discomfort is provoked by exertion, but only such things that requires him to use his arms like tying his shoes or putting on clothes.  He has not had shortness of breath with walking not withstanding the fact that he uses a cane.  Has not had immediately associated shortness of breath or diaphoresis.  He has had increasing shortness of breath over recent months..  No nocturnal dyspnea or orthopnea.  Denies edema  No palpitations.  On Coumadin.  Longstanding.  Nuisance bleeding but no significant bleeding  He has significant gait instability as the canes,    Alcohol intake is exuberant (acknowledges 2-3 beers and a half a bottle of wine daily, the latter which he shares with his wife)  DATE TEST EF   2/20 Myoview   55-65 % No perfusion defects  7/22 Echo  55-60 AoS meanGrad 18         Date Cr K Hgb  11/21    12.4 (MCV 102)  3/23 0.66 3.5 10.8 (MCV 108)  Last B12/folate measured 8/20   Thromboembolic risk factors ( age  -2, HTN-1, Vasc disease -1) for a CHADSVASc Score of >=4      Past Medical History:  Diagnosis Date   Aortic stenosis    Atrial flutter ([facility])    a. s/p remote ablation by Dr. Ladona Ridgel.   CAD (coronary artery disease)    a. s/p CABG 2003. b. 01/2016: unstable angina - s/p  BMS to SVG-ramus intermediate, patent LIMA-mLAD, patent RIMA-dRCA.    Chronic lower back pain    Essential hypertension    Hyperlipidemia    Ischemic cardiomyopathy    a. Cath 01/2016 -  EF 50-55% with mild distal inferior hypocontractility.   Peripheral neuropathy 04/04/2014   Permanent atrial fibrillation [facility])     Past Surgical History:  Procedure Laterality Date   BACK SURGERY     CARDIAC CATHETERIZATION  ~ 2000; 2003   CARDIAC CATHETERIZATION N/A 02/01/2016   Procedure: Left Heart Cath and Cors/Grafts Angiography;  Surgeon: Lennette Bihari, MD;  [facility];  Service: Cardiovascular;  Laterality: N/A;   CARDIAC CATHETERIZATION N/A 02/01/2016   Procedure: Coronary Stent Intervention;  Surgeon: Lennette Bihari, MD;  Location: [facility];  Service: Cardiovascular;  Laterality: N/A;   CATARACT EXTRACTION W/PHACO Left 02/09/2019   Procedure: CATARACT EXTRACTION PHACO AND INTRAOCULAR LENS PLACEMENT (IOC);  Surgeon: Fabio Pierce, MD;  [facility]: [facility];  Service: Ophthalmology;  Laterality: Left;  CDE: 27.70   CATARACT EXTRACTION W/PHACO Right 02/23/2019   Procedure: CATARACT EXTRACTION PHACO AND INTRAOCULAR LENS PLACEMENT RIGHT EYE;  Surgeon: Fabio Pierce, MD;  [facility]: [facility];  Service: Ophthalmology;  Laterality: Right;  CDE: 10.15   COLONOSCOPY  06/04/2002   Normal colon and rectum   COLONOSCOPY  07/08/2012   Procedure: COLONOSCOPY;  Surgeon: Corbin Ade, MD;  [facility];  Service: Endoscopy;  Laterality: N/A;  11:30   COLONOSCOPY N/A 10/02/2017   Procedure: COLONOSCOPY;  Surgeon: Corbin Ade, MD;  [facility];  Service: Endoscopy;  Laterality: N/A;  10:30am   CORONARY ANGIOPLASTY WITH STENT PLACEMENT  02/01/2016   CORONARY ARTERY BYPASS GRAFT  2003   LIMA to LAD,LIMA to distal RCA,SVG to ramus intermediate vessel.   FEMORAL REVISION Right 03/30/2019   Procedure: Femoral revision right total hip arthroplasty;  Surgeon: Ollen Gross,  MD;  [facility];  Service: Orthopedics;  Laterality: Right;    FOREIGN BODY REMOVAL Right 04/02/2014   Procedure: FOREIGN BODY REMOVAL RIGHT FOOT;  Surgeon: Dalia Heading, MD;  Location: [facility];  Service: General;  Laterality: Right;   JOINT REPLACEMENT     JOINT REPLACEMENT     LUMBAR DISC SURGERY     REVISION TOTAL HIP ARTHROPLASTY Bilateral 2005-2012   right-left   SHOULDER OPEN ROTATOR CUFF REPAIR Right    TOTAL HIP ARTHROPLASTY Right 1999   TOTAL HIP ARTHROPLASTY Left 2008   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        Inpatient Medications: Scheduled Meds:  aspirin EC  81 mg Oral Q breakfast   [START ON 11/12/2021] atorvastatin  80 mg Oral Daily   [START ON 11/12/2021] diltiazem  240 mg Oral Daily   [START ON 11/12/2021] furosemide  40 mg Intravenous Daily   [START ON 11/12/2021] metoprolol succinate  100 mg Oral Daily   multivitamin with minerals  1 tablet Oral Daily   sodium chloride flush  3 mL Intravenous Q12H   sodium chloride flush  3 mL Intravenous Q12H   [START ON 11/12/2021] Warfarin - Pharmacist Dosing Inpatient   Does not apply q1600   Continuous Infusions:  sodium chloride     PRN Meds: sodium chloride, acetaminophen **OR** acetaminophen, bisacodyl, HYDROcodone-acetaminophen, nitroGLYCERIN, ondansetron **OR** ondansetron (ZOFRAN) IV, polyethylene glycol, sodium chloride flush, traZODone  Allergies:    Allergies  Allergen Reactions   Augmentin [Amoxicillin-Pot Clavulanate] Nausea And Vomiting and Other (See Comments)    Has patient had a PCN reaction causing immediate rash, facial/tongue/throat swelling, SOB or lightheadedness with hypotension: Unknown Has patient had a PCN reaction causing severe rash involving mucus membranes or skin necrosis: No Has patient had  a PCN reaction that required hospitalization: No Has patient had a PCN reaction occurring within the last 10 years: No If all of the above answers are "NO", then may proceed with Cephalosporin use.     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    Employer: RETIRED  Tobacco Use   Smoking status: Former    Packs/day: 0.50    Years: 2.00    Pack years: 1.00    Types: Cigarettes    Quit date: 09/03/1966    Years since quitting: 55.2   Smokeless tobacco: Never  Vaping Use   Vaping Use:  Never used  Substance and Sexual Activity   Alcohol use: Yes    Alcohol/week: 11.0 standard drinks    Types: 7 Glasses of wine, 4 Cans of beer per week    Comment: a glass of wine with dinner/ beer on the weekend   Drug use: No   Sexual activity: Not on file  Other Topics Concern   Not on file  Social History Narrative   Not on file   Social Determinants of Health   Financial Resource Strain: Not on file  Food Insecurity: Not on file  Transportation Needs: Not on file  Physical Activity: Not on file  Stress: Not on file  Social Connections: Not on file  Intimate Partner Violence: Not on file    Family History:    Family History  Problem Relation Age of Onset   Heart attack Brother    Colon cancer Neg Hx      ROS:  Please see the history of present illness.   All other ROS reviewed and negative.     Physical Exam/Data:   Vitals:   11/11/21 1300 11/11/21 1330 11/11/21 1345 11/11/21 1447  BP: 134/70 (!) 147/68    Pulse: 67 67    Resp: 20 20    Temp:   (!) 97.4 F (36.3 C) 97.7 F (36.5 C)  TempSrc:   Oral Oral  SpO2: 91% 93%    Weight:      Height:        Intake/Output Summary (Last 24 hours) at 11/11/2021 1544 Last data filed at 11/11/2021  1216 Gross per 24 hour  Intake --  Output 1325 ml  Net -1325 ml   Last 3 Weights 11/11/2021 10/03/2021 03/30/2021  Weight (lbs) 230 lb 223 lb 226 lb 12.8 oz  Weight (kg) 104.327 kg 101.152 kg 102.876 kg     Body mass index is 32.08 kg/m.  General:  Well nourished, well developed, in no acute distress  HEENT: normal Neck: JVP approximately 10 Chest point tenderness in the left pectoral groove reproducing his pain Vascular: No carotid bruits; Distal pulses 2+ bilaterally Cardiac: Irregularly Irregular rate and rhythm  ventricular response  S1, S2; 2-3/6 systolic murmur along the right sternal border Lungs: Basilar crackles and occasional wheezing prolonged expiratory phase abd: soft, nontender, no hepatomegaly  Ext: 1+ edema Musculoskeletal:  No deformities, BUE and BLE strength normal and equal Skin: warm and dry   warmth and erythema bilateral lower extremities Neuro:  CNs 2-12 intact, no focal abnormalities noted Psych:  Normal affect   EKG:  The EKG was personally reviewed and demonstrates: Atrial fibrillation at 84 without any acute ST-T changes    Laboratory Data:  High Sensitivity Troponin:   Recent Labs  Lab 11/11/21 0825 11/11/21 1034  TROPONINIHS 12 12     Chemistry Recent Labs  Lab 11/11/21 0825  NA 131*  K 3.5  CL 98  CO2 24  GLUCOSE 115*  BUN 11  CREATININE 0.66  CALCIUM 8.6*  GFRNONAA >60  ANIONGAP 9    Recent Labs  Lab 11/11/21 0825  PROT 7.1  ALBUMIN 3.9  AST 30  ALT 31  ALKPHOS 209*  BILITOT 1.3*   Lipids No results for input(s): CHOL, TRIG, HDL, LABVLDL, LDLCALC, CHOLHDL in the last 168 hours.  Hematology Recent Labs  Lab 11/11/21 0825  WBC 6.8  RBC 3.00*  HGB 10.8*  HCT 32.4*  MCV 108.0*  MCH 36.0*  MCHC 33.3  RDW 16.1*  PLT 209  Thyroid No results for input(s): TSH, FREET4 in the last 168 hours.  BNP Recent Labs  Lab 11/11/21 0825  BNP 152.0*    DDimer No results for input(s): DDIMER in the last 168  hours.   Radiology/Studies:  DG Chest Port 1 View  Result Date: 11/11/2021 CLINICAL DATA:  81 year old male with history of chest pain with exertion. EXAM: PORTABLE CHEST 1 VIEW COMPARISON:  Chest x-ray 04/07/2020. FINDINGS: There is cephalization of the pulmonary vasculature and slight indistinctness of the interstitial markings suggestive of mild pulmonary edema. No definite pleural effusions. Mild cardiomegaly. Upper mediastinal contours are within normal limits. Atherosclerotic calcifications in the thoracic aorta. Status post median sternotomy for CABG. IMPRESSION: 1. The appearance the chest suggest congestive heart failure, as above. 2. Aortic atherosclerosis. Electronically Signed   By: Trudie Reed M.D.   On: 11/11/2021 09:32     Assessment and Plan:   Atypical chest pain likely musculoskeletal Lower extremity cellulitis Volume overload/HFpEF Aortic stenosis-moderate Macrocytic anemia Gait instability Alcohol intake exuberant  The patient has atypical chest pain with reproducing musculoskeletal tenderness.  Suspect that this is noncardiac.  However, it has been quite a while and has high risk for coronary disease we will undertake Myoview scanning.  Has evidence of heart failure on examination as well as dyspnea on exertion.  We will repeat his echocardiogram.  Likely HFpEF.  We will give him IV diuretics tonight.  Check echocardiogram tomorrow and reassess aortic valve at that time  He is macrocytic anemia.  We will check B12 folate and iron numbers.  He has significant alcohol intake which may be contributing  We will transition him to a NOAC prior to discharge      Risk Assessment/Risk Scores:      HEAR Score (for undifferentiated chest pain):     New York Heart Association (NYHA) Functional Class NYHA Class II-III              For questions or updates, please contact [facility] Please consult [email or web address] for contact info under    Signed, Sherryl Manges, MD  11/11/2021 3:44 PM

## 2021-11-11 NOTE — Progress Notes (Signed)
ANTICOAGULATION CONSULT NOTE ? ?Pharmacy Consult for warfarin ?Indication: atrial fibrillation ? ?Allergies  ?Allergen Reactions  ? Augmentin [Amoxicillin-Pot Clavulanate] Nausea And Vomiting and Other (See Comments)  ?  Has patient had a PCN reaction causing immediate rash, facial/tongue/throat swelling, SOB or lightheadedness with hypotension: Unknown ?Has patient had a PCN reaction causing severe rash involving mucus membranes or skin necrosis: No ?Has patient had a PCN reaction that required hospitalization: No ?Has patient had a PCN reaction occurring within the last 10 years: No ?If all of the above answers are "NO", then may proceed with Cephalosporin use. ?  ? ? ?Patient Measurements: ?Height: '5\' 11"'$  (180.3 cm) ?Weight: 104.3 kg (230 lb) ?IBW/kg (Calculated) : 75.3 ? ?Vital Signs: ?Temp: 97.7 ?F (36.5 ?C) (03/11 1447) ?Temp Source: Oral (03/11 1447) ?BP: 147/68 (03/11 1330) ?Pulse Rate: 67 (03/11 1330) ? ?Labs: ?Recent Labs  ?  11/11/21 ?0825 11/11/21 ?1034  ?HGB 10.8*  --   ?HCT 32.4*  --   ?PLT 209  --   ?LABPROT 23.6*  --   ?INR 2.1*  --   ?CREATININE 0.66  --   ?TROPONINIHS 12 12  ? ? ?Estimated Creatinine Clearance: 90.5 mL/min (by C-G formula based on SCr of 0.66 mg/dL). ? ? ?Medical History: ?Past Medical History:  ?Diagnosis Date  ? Aortic stenosis   ? Atrial flutter ([place])   ? a. s/p remote ablation by Dr. [name].  ? CAD (coronary artery disease)   ? a. s/p CABG 2003. b. 01/2016: unstable angina - s/p BMS to SVG-ramus intermediate, patent LIMA-mLAD, patent RIMA-dRCA.   ? Chronic lower back pain   ? Essential hypertension   ? Hyperlipidemia   ? Ischemic cardiomyopathy   ? a. Cath 01/2016 -  EF 50-55% with mild distal inferior hypocontractility.  ? Peripheral neuropathy 04/04/2014  ? Permanent atrial fibrillation ([place])   ? ?Assessment: ?66 yoM on warfarin PTA for hx AF admitted with CP. Pharmacy consulted to dose warfarin. INR therapeutic at 2.1, last dose was today. ? ?Goal of Therapy:  ?INR  2-3 ?Monitor platelets by anticoagulation protocol: Yes ?  ?Plan:  ?No warfarin tonight (took dose at home already) ?Daily INR ? ?Arrie Senate, PharmD, BCPS, BCCP ?Clinical Pharmacist ?[phone] ?Please check AMION for all [place] numbers ?11/11/2021 ? ? ?

## 2021-11-11 NOTE — ED Provider Notes (Incomplete Revision)
[facility] Provider Note   CSN: [id number] Arrival date & time: 11/11/21  9449     History  Chief Complaint  Patient presents with   Chest Pain    Leonard Cisneros is a 81 y.o. male.  Patient with hx cad, AS, afib, c/o mid to left chest pain in the past week. Symptoms acute onset, episodic/comes and goes, worse w exertion, better w rest, lasts several minutes. +sob. No nausea, vomiting or diaphoresis. No palpitations. No pleuritic and/or constant pain. +increased bilateral leg swelling in past week. No calf pain. On anticoag therapy for afib - compliant w all home meds. No abnormal bruising or bleeding. Denies orthopnea or pnd. No fever or chills. No cough or uri symptoms.   The history is provided by the patient, the spouse and medical records.  Chest Pain Associated symptoms: shortness of breath   Associated symptoms: no abdominal pain, no back pain, no cough, no fever, no headache, no palpitations and no vomiting       Home Medications Prior to Admission medications   Medication Sig Start Date End Date Taking? Authorizing Provider  atorvastatin (LIPITOR) 80 MG tablet TAKE 1 TABLET BY MOUTH EVERY DAY 06/08/21  Yes Satira Sark, MD  diltiazem (DILT-XR) 240 MG 24 hr capsule TAKE 1 CAPSULE BY MOUTH EVERY DAY 12/20/20  Yes Satira Sark, MD  furosemide (LASIX) 40 MG tablet TAKE ONE DAILY. MAY TAKE AN EXTRA TABLET AS NEEDED FOR EDEMA. 05/17/21  Yes Satira Sark, MD  metoprolol succinate (TOPROL-XL) 100 MG 24 hr tablet TAKE 1 TABLET BY MOUTH DAILY. TAKE WITH OR IMMEDIATELY FOLLOWING A MEAL. 10/05/21  Yes Satira Sark, MD  nitroGLYCERIN (NITROSTAT) 0.4 MG SL tablet Place 1 tablet (0.4 mg total) under the tongue every 5 (five) minutes x 3 doses as needed for chest pain (if no relief after 3rd dose, proceed to the ED for an evaluation or call 911). 04/21/20  Yes Verta Ellen., NP  warfarin (COUMADIN) 7.5 MG tablet TAKE  1 TABLET DAILY AS  DIRECTED BY COUMADIN CLINIC 03/30/21  Yes Satira Sark, MD  aspirin EC 81 MG tablet Take 81 mg by mouth daily.    [provider]  chlorhexidine (PERIDEX) 0.12 % solution Use as directed 15 mLs in the mouth or throat 2 (two) times daily. 09/02/21   Volney American, PA-C  HYDROcodone-acetaminophen (NORCO) 5-325 MG tablet Take 1 tablet by mouth 3 (three) times daily as needed for moderate pain. 09/08/20   [name], Malena M, DO  lidocaine (XYLOCAINE) 2 % solution Use as directed 10 mLs in the mouth or throat every 3 (three) hours as needed for mouth pain. 09/02/21   Volney American, PA-C      Allergies    Augmentin [amoxicillin-pot clavulanate]    Review of Systems   Review of Systems  Constitutional:  Negative for chills and fever.  HENT:  Negative for sore throat.   Eyes:  Negative for redness.  Respiratory:  Positive for shortness of breath. Negative for cough.   Cardiovascular:  Positive for chest pain and leg swelling. Negative for palpitations.  Gastrointestinal:  Negative for abdominal pain and vomiting.  Genitourinary:  Negative for flank pain.  Musculoskeletal:  Negative for back pain and neck pain.  Skin:  Negative for rash.  Neurological:  Negative for headaches.  Hematological:        On coumadin  Psychiatric/Behavioral:  Negative for confusion.    Physical Exam  Updated Vital Signs BP 125/71    Pulse 77    Temp 97.8 F (36.6 C) (Oral)    Resp 20    Ht 1.803 m ('5\' 11"'$ )    Wt 104.3 kg    SpO2 93%    BMI 32.08 kg/m  Physical Exam Vitals and nursing note reviewed.  Constitutional:      Appearance: Normal appearance. He is well-developed.  HENT:     Head: Atraumatic.     Nose: Nose normal.     Mouth/Throat:     Mouth: Mucous membranes are moist.     Pharynx: Oropharynx is clear.  Eyes:     General: No scleral icterus.    Conjunctiva/sclera: Conjunctivae normal.  Neck:     Trachea: No tracheal deviation.  Cardiovascular:     Rate and Rhythm:  Normal rate and regular rhythm.     Pulses: Normal pulses.     Heart sounds: Normal heart sounds. No murmur heard.   No friction rub. No gallop.  Pulmonary:     Effort: Pulmonary effort is normal. No accessory muscle usage or respiratory distress.     Comments: Rales bases.  Chest:     Chest wall: No tenderness.  Abdominal:     General: Bowel sounds are normal. There is no distension.     Palpations: Abdomen is soft.     Tenderness: There is no abdominal tenderness.  Genitourinary:    Comments: No cva tenderness. Musculoskeletal:        General: No tenderness.     Cervical back: Normal range of motion and neck supple. No rigidity.     Right lower leg: Edema present.     Left lower leg: Edema present.     Comments: Mod-severe, symmetric, bilateral leg edema.   Skin:    General: Skin is warm and dry.     Findings: No rash.  Neurological:     Mental Status: He is alert.     Comments: Alert, speech clear.   Psychiatric:        Mood and Affect: Mood normal.    ED Results / Procedures / Treatments   Labs (all labs ordered are listed, but only abnormal results are displayed) Results for orders placed or performed during the hospital encounter of 11/11/21  Resp Panel by RT-PCR (Flu A&B, Covid) Nasopharyngeal Swab   Specimen: Nasopharyngeal Swab; Nasopharyngeal(NP) swabs in vial transport medium  Result Value Ref Range   SARS Coronavirus 2 by RT PCR NEGATIVE NEGATIVE   Influenza A by PCR NEGATIVE NEGATIVE   Influenza B by PCR NEGATIVE NEGATIVE  CBC  Result Value Ref Range   WBC 6.8 4.0 - 10.5 K/uL   RBC 3.00 (L) 4.22 - 5.81 MIL/uL   Hemoglobin 10.8 (L) 13.0 - 17.0 g/dL   HCT 32.4 (L) 39.0 - 52.0 %   MCV 108.0 (H) 80.0 - 100.0 fL   MCH 36.0 (H) 26.0 - 34.0 pg   MCHC 33.3 30.0 - 36.0 g/dL   RDW 16.1 (H) 11.5 - 15.5 %   Platelets 209 150 - 400 K/uL   nRBC 0.4 (H) 0.0 - 0.2 %  Comprehensive metabolic panel  Result Value Ref Range   Sodium 131 (L) 135 - 145 mmol/L    Potassium 3.5 3.5 - 5.1 mmol/L   Chloride 98 98 - 111 mmol/L   CO2 24 22 - 32 mmol/L   Glucose, Bld 115 (H) 70 - 99 mg/dL   BUN 11 8 - 23 mg/dL  Creatinine, Ser 0.66 0.61 - 1.24 mg/dL   Calcium 8.6 (L) 8.9 - 10.3 mg/dL   Total Protein 7.1 6.5 - 8.1 g/dL   Albumin 3.9 3.5 - 5.0 g/dL   AST 30 15 - 41 U/L   ALT 31 0 - 44 U/L   Alkaline Phosphatase 209 (H) 38 - 126 U/L   Total Bilirubin 1.3 (H) 0.3 - 1.2 mg/dL   GFR, Estimated >60 >60 mL/min   Anion gap 9 5 - 15  Brain natriuretic peptide  Result Value Ref Range   B Natriuretic Peptide 152.0 (H) 0.0 - 100.0 pg/mL  Protime-INR  Result Value Ref Range   Prothrombin Time 23.6 (H) 11.4 - 15.2 seconds   INR 2.1 (H) 0.8 - 1.2  Troponin I (High Sensitivity)  Result Value Ref Range   Troponin I (High Sensitivity) 12 <18 ng/L     EKG EKG Interpretation  Date/Time:  Saturday November 11 2021 08:22:59 EST Ventricular Rate:  83 PR Interval:    QRS Duration: 104 QT Interval:  394 QTC Calculation: 463 R Axis:   107 Text Interpretation: Atrial fibrillation Right axis deviation Nonspecific ST abnormality Baseline wander Confirmed by Lajean Saver [phone] on 11/11/2021 8:29:59 AM  Radiology DG Chest Port 1 View  Result Date: 11/11/2021 CLINICAL DATA:  81 year old male with history of chest pain with exertion. EXAM: PORTABLE CHEST 1 VIEW COMPARISON:  Chest x-ray 04/07/2020. FINDINGS: There is cephalization of the pulmonary vasculature and slight indistinctness of the interstitial markings suggestive of mild pulmonary edema. No definite pleural effusions. Mild cardiomegaly. Upper mediastinal contours are within normal limits. Atherosclerotic calcifications in the thoracic aorta. Status post median sternotomy for CABG. IMPRESSION: 1. The appearance the chest suggest congestive heart failure, as above. 2. Aortic atherosclerosis. Electronically Signed   By: Vinnie Langton M.D.   On: 11/11/2021 09:32    Procedures Procedures    Medications  Ordered in ED Medications - No data to display  ED Course/ Medical Decision Making/ A&P                           Medical Decision Making Problems Addressed: Acute on chronic combined systolic and diastolic CHF (congestive heart failure) ([place]): acute illness or injury with systemic symptoms that poses a threat to life or bodily functions Bilateral leg edema: acute illness or injury Chronic atrial fibrillation ([place]): chronic illness or injury History of coronary artery disease: chronic illness or injury with exacerbation, progression, or side effects of treatment Moderate aortic stenosis: chronic illness or injury that poses a threat to life or bodily functions Precordial chest pain: acute illness or injury that poses a threat to life or bodily functions  Amount and/or Complexity of Data Reviewed Independent Historian: spouse    Details: additional hx. External Data Reviewed: labs, radiology, ECG and notes. Labs: ordered. Decision-making details documented in ED Course. Radiology: ordered and independent interpretation performed. Decision-making details documented in ED Course. ECG/medicine tests: ordered and independent interpretation performed. Decision-making details documented in ED Course. Discussion of management or test interpretation with external provider(s): Hospitalists, discussed pt, labs, imaging.   Risk Prescription drug management. Decision regarding hospitalization.   Iv ns. Continuous pulse ox and cardiac monitoring. Labs ordered/sent. Imaging ordered.   Reviewed nursing notes and prior charts for additional history.  External reports reviewed. Additional hx from: spouse.  Prior charts/echo - hx moderate aortic stenosis. Prior cath, hx multi vessel cad.   Cardiac monitor: afib, raate 79.  Labs reviewed/interpreted by me - wbc normal.   Xrays reviewed/interpreted by me - vascular congestion/chf. Lasix iv.   Additional labs reviewed/interpreted by me - initial  trop normal, bnp elevated.   Recheck pt, no current chest pain or discomfort. Given recent recurrent chest pain, hx cad, hx AS, ?demand ischemia, ?chf exacerbation. Will consult hospitalists for admission.  Discussed pt with hospitalist - will admit. Requests we consult cardiology for clarity on admit site and tentative cardiology plan/recommendations - cardiology consulted.   Still await cardiology callback - will re-page/re-consult.             Final Clinical Impression(s) / ED Diagnoses Final diagnoses:  None    Rx / DC Orders ED Discharge Orders     None

## 2021-11-11 NOTE — ED Provider Notes (Addendum)
[facility] Provider Note   CSN: [id number] Arrival date & time: 11/11/21  7829     History  Chief Complaint  Patient presents with   Chest Pain    Leonard Cisneros is a 81 y.o. male.  Patient with hx cad, AS, afib, c/o mid to left chest pain in the past week. Symptoms acute onset, episodic/comes and goes, worse w exertion, better w rest, lasts several minutes. +sob. No nausea, vomiting or diaphoresis. No palpitations. No pleuritic and/or constant pain. +increased bilateral leg swelling in past week. No calf pain. On anticoag therapy for afib - compliant w all home meds. No abnormal bruising or bleeding. Denies orthopnea or pnd. No fever or chills. No cough or uri symptoms.   The history is provided by the patient, the spouse and medical records.  Chest Pain Associated symptoms: shortness of breath   Associated symptoms: no abdominal pain, no back pain, no cough, no fever, no headache, no palpitations and no vomiting       Home Medications Prior to Admission medications   Medication Sig Start Date End Date Taking? Authorizing Provider  atorvastatin (LIPITOR) 80 MG tablet TAKE 1 TABLET BY MOUTH EVERY DAY 06/08/21  Yes Satira Sark, MD  diltiazem (DILT-XR) 240 MG 24 hr capsule TAKE 1 CAPSULE BY MOUTH EVERY DAY 12/20/20  Yes Satira Sark, MD  furosemide (LASIX) 40 MG tablet TAKE ONE DAILY. MAY TAKE AN EXTRA TABLET AS NEEDED FOR EDEMA. 05/17/21  Yes Satira Sark, MD  metoprolol succinate (TOPROL-XL) 100 MG 24 hr tablet TAKE 1 TABLET BY MOUTH DAILY. TAKE WITH OR IMMEDIATELY FOLLOWING A MEAL. 10/05/21  Yes Satira Sark, MD  nitroGLYCERIN (NITROSTAT) 0.4 MG SL tablet Place 1 tablet (0.4 mg total) under the tongue every 5 (five) minutes x 3 doses as needed for chest pain (if no relief after 3rd dose, proceed to the ED for an evaluation or call 911). 04/21/20  Yes Verta Ellen., NP  warfarin (COUMADIN) 7.5 MG tablet TAKE  1 TABLET DAILY AS  DIRECTED BY COUMADIN CLINIC 03/30/21  Yes Satira Sark, MD  aspirin EC 81 MG tablet Take 81 mg by mouth daily.    [provider]  chlorhexidine (PERIDEX) 0.12 % solution Use as directed 15 mLs in the mouth or throat 2 (two) times daily. 09/02/21   Volney American, PA-C  HYDROcodone-acetaminophen (NORCO) 5-325 MG tablet Take 1 tablet by mouth 3 (three) times daily as needed for moderate pain. 09/08/20   [name], Malena M, DO  lidocaine (XYLOCAINE) 2 % solution Use as directed 10 mLs in the mouth or throat every 3 (three) hours as needed for mouth pain. 09/02/21   Volney American, PA-C      Allergies    Augmentin [amoxicillin-pot clavulanate]    Review of Systems   Review of Systems  Constitutional:  Negative for chills and fever.  HENT:  Negative for sore throat.   Eyes:  Negative for redness.  Respiratory:  Positive for shortness of breath. Negative for cough.   Cardiovascular:  Positive for chest pain and leg swelling. Negative for palpitations.  Gastrointestinal:  Negative for abdominal pain and vomiting.  Genitourinary:  Negative for flank pain.  Musculoskeletal:  Negative for back pain and neck pain.  Skin:  Negative for rash.  Neurological:  Negative for headaches.  Hematological:        On coumadin  Psychiatric/Behavioral:  Negative for confusion.    Physical Exam  Updated Vital Signs BP 125/71    Pulse 77    Temp 97.8 F (36.6 C) (Oral)    Resp 20    Ht 1.803 m ('5\' 11"'$ )    Wt 104.3 kg    SpO2 93%    BMI 32.08 kg/m  Physical Exam Vitals and nursing note reviewed.  Constitutional:      Appearance: Normal appearance. He is well-developed.  HENT:     Head: Atraumatic.     Nose: Nose normal.     Mouth/Throat:     Mouth: Mucous membranes are moist.     Pharynx: Oropharynx is clear.  Eyes:     General: No scleral icterus.    Conjunctiva/sclera: Conjunctivae normal.  Neck:     Trachea: No tracheal deviation.  Cardiovascular:     Rate and Rhythm:  Normal rate and regular rhythm.     Pulses: Normal pulses.     Heart sounds: Normal heart sounds. No murmur heard.   No friction rub. No gallop.  Pulmonary:     Effort: Pulmonary effort is normal. No accessory muscle usage or respiratory distress.     Comments: Rales bases.  Chest:     Chest wall: No tenderness.  Abdominal:     General: Bowel sounds are normal. There is no distension.     Palpations: Abdomen is soft.     Tenderness: There is no abdominal tenderness.  Genitourinary:    Comments: No cva tenderness. Musculoskeletal:        General: No tenderness.     Cervical back: Normal range of motion and neck supple. No rigidity.     Right lower leg: Edema present.     Left lower leg: Edema present.     Comments: Mod-severe, symmetric, bilateral leg edema.   Skin:    General: Skin is warm and dry.     Findings: No rash.  Neurological:     Mental Status: He is alert.     Comments: Alert, speech clear.   Psychiatric:        Mood and Affect: Mood normal.    ED Results / Procedures / Treatments   Labs (all labs ordered are listed, but only abnormal results are displayed) Results for orders placed or performed during the hospital encounter of 11/11/21  Resp Panel by RT-PCR (Flu A&B, Covid) Nasopharyngeal Swab   Specimen: Nasopharyngeal Swab; Nasopharyngeal(NP) swabs in vial transport medium  Result Value Ref Range   SARS Coronavirus 2 by RT PCR NEGATIVE NEGATIVE   Influenza A by PCR NEGATIVE NEGATIVE   Influenza B by PCR NEGATIVE NEGATIVE  CBC  Result Value Ref Range   WBC 6.8 4.0 - 10.5 K/uL   RBC 3.00 (L) 4.22 - 5.81 MIL/uL   Hemoglobin 10.8 (L) 13.0 - 17.0 g/dL   HCT 32.4 (L) 39.0 - 52.0 %   MCV 108.0 (H) 80.0 - 100.0 fL   MCH 36.0 (H) 26.0 - 34.0 pg   MCHC 33.3 30.0 - 36.0 g/dL   RDW 16.1 (H) 11.5 - 15.5 %   Platelets 209 150 - 400 K/uL   nRBC 0.4 (H) 0.0 - 0.2 %  Comprehensive metabolic panel  Result Value Ref Range   Sodium 131 (L) 135 - 145 mmol/L    Potassium 3.5 3.5 - 5.1 mmol/L   Chloride 98 98 - 111 mmol/L   CO2 24 22 - 32 mmol/L   Glucose, Bld 115 (H) 70 - 99 mg/dL   BUN 11 8 - 23 mg/dL  Creatinine, Ser 0.66 0.61 - 1.24 mg/dL   Calcium 8.6 (L) 8.9 - 10.3 mg/dL   Total Protein 7.1 6.5 - 8.1 g/dL   Albumin 3.9 3.5 - 5.0 g/dL   AST 30 15 - 41 U/L   ALT 31 0 - 44 U/L   Alkaline Phosphatase 209 (H) 38 - 126 U/L   Total Bilirubin 1.3 (H) 0.3 - 1.2 mg/dL   GFR, Estimated >60 >60 mL/min   Anion gap 9 5 - 15  Brain natriuretic peptide  Result Value Ref Range   B Natriuretic Peptide 152.0 (H) 0.0 - 100.0 pg/mL  Protime-INR  Result Value Ref Range   Prothrombin Time 23.6 (H) 11.4 - 15.2 seconds   INR 2.1 (H) 0.8 - 1.2  Troponin I (High Sensitivity)  Result Value Ref Range   Troponin I (High Sensitivity) 12 <18 ng/L     EKG EKG Interpretation  Date/Time:  Saturday November 11 2021 08:22:59 EST Ventricular Rate:  83 PR Interval:    QRS Duration: 104 QT Interval:  394 QTC Calculation: 463 R Axis:   107 Text Interpretation: Atrial fibrillation Right axis deviation Nonspecific ST abnormality Baseline wander Confirmed by Lajean Saver [phone] on 11/11/2021 8:29:59 AM  Radiology DG Chest Port 1 View  Result Date: 11/11/2021 CLINICAL DATA:  81 year old male with history of chest pain with exertion. EXAM: PORTABLE CHEST 1 VIEW COMPARISON:  Chest x-ray 04/07/2020. FINDINGS: There is cephalization of the pulmonary vasculature and slight indistinctness of the interstitial markings suggestive of mild pulmonary edema. No definite pleural effusions. Mild cardiomegaly. Upper mediastinal contours are within normal limits. Atherosclerotic calcifications in the thoracic aorta. Status post median sternotomy for CABG. IMPRESSION: 1. The appearance the chest suggest congestive heart failure, as above. 2. Aortic atherosclerosis. Electronically Signed   By: Vinnie Langton M.D.   On: 11/11/2021 09:32    Procedures Procedures    Medications  Ordered in ED Medications - No data to display  ED Course/ Medical Decision Making/ A&P                           Medical Decision Making Problems Addressed: Acute on chronic combined systolic and diastolic CHF (congestive heart failure) ([place]): acute illness or injury with systemic symptoms that poses a threat to life or bodily functions Bilateral leg edema: acute illness or injury Chronic atrial fibrillation ([place]): chronic illness or injury History of coronary artery disease: chronic illness or injury with exacerbation, progression, or side effects of treatment Moderate aortic stenosis: chronic illness or injury that poses a threat to life or bodily functions Precordial chest pain: acute illness or injury that poses a threat to life or bodily functions  Amount and/or Complexity of Data Reviewed Independent Historian: spouse    Details: additional hx. External Data Reviewed: labs, radiology, ECG and notes. Labs: ordered. Decision-making details documented in ED Course. Radiology: ordered and independent interpretation performed. Decision-making details documented in ED Course. ECG/medicine tests: ordered and independent interpretation performed. Decision-making details documented in ED Course. Discussion of management or test interpretation with external provider(s): Hospitalists, discussed pt, labs, imaging.   Risk Prescription drug management. Decision regarding hospitalization.   Iv ns. Continuous pulse ox and cardiac monitoring. Labs ordered/sent. Imaging ordered.   Reviewed nursing notes and prior charts for additional history.  External reports reviewed. Additional hx from: spouse.  Prior charts/echo - hx moderate aortic stenosis. Prior cath, hx multi vessel cad.   Cardiac monitor: afib, raate 79.  Labs reviewed/interpreted by me - wbc normal.   Xrays reviewed/interpreted by me - vascular congestion/chf. Lasix iv.   Additional labs reviewed/interpreted by me - initial  trop normal, bnp elevated.   Recheck pt, no current chest pain or discomfort. Given recent recurrent chest pain, hx cad, hx AS, ?demand ischemia, ?chf exacerbation. Will consult hospitalists for admission.  Discussed pt with hospitalist - will admit. Requests we consult cardiology for clarity on admit site and tentative cardiology plan/recommendations - cardiology consulted.   Still await cardiology callback - will re-page/re-consult.  Cardiology indicates hospitalist can admit to [facility], and can consult them once there - discussed w hosptialist.              Final Clinical Impression(s) / ED Diagnoses Final diagnoses:  None    Rx / DC Orders ED Discharge Orders     None           Lajean Saver, MD 11/13/21 1511

## 2021-11-11 NOTE — ED Triage Notes (Signed)
Reports chest pain for a few days with exertion. Reports it doesn't hurt when he is still but when he moves it hurts.  Hx afib, CHF.  Has had increased leg swelling.  ?

## 2021-11-11 NOTE — H&P (Signed)
Patient Demographics:    Senica Crall, is a 81 y.o. male  MRN: [id number]   DOB - [birth year]  Admit Date - 11/11/2021  Outpatient Primary MD for the patient is Erven Colla, DO   Assessment & Plan:   Assessment and Plan:  1)Atypical CP--- Prior CABG 2003, s/p s/p BMS to SVG-ramus intermediate on 01/2016, LHC in May 2017 showed patent LIMA-mLAD and patent RIMA-dRCA - EKG shows A-fib with heart rate around 84 without acute ST changes -Troponin x2 not elevated -Cardiology consult appreciated recommends stress test in a.m. 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, patient presented with dyspnea on exertion and increasing lower extremity edema -Chest x-ray suggestive of congestive heart failure,  BNP mildly elevated at 152 -IV Lasix as ordered -PTA already on Toprol-XL -Daily weight and fluid input and output monitoring  3) chronic atrial fibrillation---on Coumadin, INR therapeutic at 2.1 -Pharmacy consult for Coumadin management -May be able to transition to [place] upon discharge -Continue Cardizem and Toprol-XL for rate control  4)EToh Abuse- lorazepam per CIWA protocol, folic acid and thiamine as ordered -Serum folate level WNL, B12 levels pending  5)Hyponatremia---Sodium is 131, suspect beer potomania -Watch electrolytes closely with IV diuresis  6)Chronic Macrocytic Anemia---Hemoglobin is 10.8,  platelets 209 -No bleeding concerns -Monitor closely -Serum Folate WNL, B12 levels pending, serum iron and TIBC pending  7)Generalized Weakness and Ambulatory Dysfunction--- at baseline patient walks with a cane  Disposition/Need for in-Hospital Stay- patient unable to be discharged at this time due to --chest pain requiring further investigations  including possible stress test in a.m. -Diastolic dysfunction CHF-patient requiring IV diuresis  Dispo: The patient is from: Home              Anticipated d/c is to: Home              Anticipated d/c date is: 1 day              Patient currently is not medically stable to d/c. Barriers: Not Clinically Stable-    With History of - Reviewed by me  Past Medical History:  Diagnosis Date   Aortic stenosis    Atrial flutter (HCC)    a. s/p remote ablation by Dr. [name].   CAD (coronary artery disease)    a. s/p CABG 2003. b. 01/2016: unstable angina - s/p BMS to SVG-ramus intermediate, patent LIMA-mLAD, patent RIMA-dRCA.    Chronic lower back pain    Essential hypertension    Hyperlipidemia    Ischemic cardiomyopathy    a. Cath 01/2016 -  EF 50-55% with mild distal inferior hypocontractility.   Peripheral neuropathy 04/04/2014   Permanent atrial fibrillation [facility])       Past Surgical History:  Procedure Laterality Date   BACK SURGERY     CARDIAC CATHETERIZATION  ~ 2000; 2003   CARDIAC CATHETERIZATION N/A 02/01/2016   Procedure: Left Heart Cath and Cors/Grafts  Angiography;  Surgeon: [name], MD;  Location: [place] [facility];  Service: Cardiovascular;  Laterality: N/A;   CARDIAC CATHETERIZATION N/A 02/01/2016   Procedure: Coronary Stent Intervention;  Surgeon: [name], MD;  Location: [place] [facility];  Service: Cardiovascular;  Laterality: N/A;   CATARACT EXTRACTION W/PHACO Left 02/09/2019   Procedure: CATARACT EXTRACTION PHACO AND INTRAOCULAR LENS PLACEMENT (IOC);  Surgeon: [name], MD;  Location: [facility];  Service: Ophthalmology;  Laterality: Left;  CDE: 27.70   CATARACT EXTRACTION W/PHACO Right 02/23/2019   Procedure: CATARACT EXTRACTION PHACO AND INTRAOCULAR LENS PLACEMENT RIGHT EYE;  Surgeon: [name], MD;  Location: [facility];  Service: Ophthalmology;  Laterality: Right;  CDE: 10.15   COLONOSCOPY  06/04/2002   Normal colon and rectum   COLONOSCOPY   07/08/2012   Procedure: COLONOSCOPY;  Surgeon: [name], MD;  Location: [facility];  Service: Endoscopy;  Laterality: N/A;  11:30   COLONOSCOPY N/A 10/02/2017   Procedure: COLONOSCOPY;  Surgeon: [name], MD;  Location: [facility];  Service: Endoscopy;  Laterality: N/A;  10:30am   CORONARY ANGIOPLASTY WITH STENT PLACEMENT  02/01/2016   CORONARY ARTERY BYPASS GRAFT  2003   LIMA to LAD,LIMA to distal RCA,SVG to ramus intermediate vessel.   FEMORAL REVISION Right 03/30/2019   Procedure: Femoral revision right total hip arthroplasty;  Surgeon: [name], MD;  Location: [facility];  Service: Orthopedics;  Laterality: Right;  130mn   FOREIGN BODY REMOVAL Right 04/02/2014   Procedure: FOREIGN BODY REMOVAL RIGHT FOOT;  Surgeon: MJamesetta So MD;  Location: [facility];  Service: General;  Laterality: Right;   JOINT REPLACEMENT     JOINT REPLACEMENT     LUMBAR DDeshlerSURGERY     REVISION TOTAL HIP ARTHROPLASTY Bilateral 2005-2012   right-left   SHOULDER OPEN ROTATOR CUFF REPAIR Right    TOTAL HIP ARTHROPLASTY Right 1999   TOTAL HIP ARTHROPLASTY Left 2008   [place]  11/13/2011   LV mildly dilated,mild concentric LVH,LA mod - severely dilated,RA mildly dilated,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   Chief Complaint  Patient presents with   Chest Pain      HPI:    RDahir Ayer is a 81y.o. male with PMHx relevant for chronic A-fib, status post prior ablation on Coumadin for anticoagulation, h/o moderate aortic stenosis, h/o CAD with Prior CABG 2003, s/p s/p BMS to SVG-ramus intermediate on 01/2016-- LHC in May 2017 showed patent LIMA-mLAD and patent RIMA-dRCA -As well as h/o HTN and HLD who presents to the ED with chest pain since 11/07/2021, chest pain is at times associated with activity, patient also reports dyspnea on exertion -Patient noticed increased lower extremity edema but no pleuritic type  symptoms, no leg pains - No Nausea, Vomiting or Diarrhea No fever  Or chills  -EKG shows A-fib with heart rate around 84 without acute ST changes -Troponin x2 not elevated -Chest x-ray suggestive of  -INR 2.1 congestive heart failure,  BNP mildly elevated at 152 -Sodium is 131, creatinine 0.66 -Hemoglobin is 10.8 WBC 6.8 platelets 209    Review of systems:    In addition to the HPI above,   A full Review of  Systems was done, all other systems reviewed are negative except as noted above in HPI , .    Social History:  Reviewed by me    Social History   Tobacco Use  Smoking status: Former    Packs/day: 0.50    Years: 2.00    Pack years: 1.00    Types: Cigarettes    Quit date: 09/03/1966    Years since quitting: 55.2   Smokeless tobacco: Never  Substance Use Topics   Alcohol use: Yes    Alcohol/week: 11.0 standard drinks    Types: 7 Glasses of wine, 4 Cans of beer per week    Comment: a glass of wine with dinner/ beer on the weekend    Family History :  Reviewed by me    Family History  Problem Relation Age of Onset   Heart attack Brother    Colon cancer Neg Hx      Home Medications:   Prior to Admission medications   Medication Sig Start Date End Date Taking? Authorizing Provider  aspirin EC 81 MG tablet Take 81 mg by mouth daily.   Yes [provider]  atorvastatin (LIPITOR) 80 MG tablet TAKE 1 TABLET BY MOUTH EVERY DAY 06/08/21  Yes Satira Sark, MD  diltiazem (DILT-XR) 240 MG 24 hr capsule TAKE 1 CAPSULE BY MOUTH EVERY DAY 12/20/20  Yes Satira Sark, MD  furosemide (LASIX) 40 MG tablet TAKE ONE DAILY. MAY TAKE AN EXTRA TABLET AS NEEDED FOR EDEMA. 05/17/21  Yes Satira Sark, MD  HYDROcodone-acetaminophen (NORCO) 5-325 MG tablet Take 1 tablet by mouth 3 (three) times daily as needed for moderate pain. 09/08/20  Yes [name], Malena M, DO  metoprolol succinate (TOPROL-XL) 100 MG 24 hr tablet TAKE 1 TABLET BY MOUTH DAILY. TAKE WITH OR  IMMEDIATELY FOLLOWING A MEAL. 10/05/21  Yes Satira Sark, MD  naloxone [facility]) nasal spray 4 mg/0.1 mL naloxone 4 mg/actuation nasal spray  TAKE BY NASAL ROUTE EVERY 3 MINUTES UNTIL PATIENT AWAKES OR EMS ARRIVES.   Yes [provider]  nitroGLYCERIN (NITROSTAT) 0.4 MG SL tablet Place 1 tablet (0.4 mg total) under the tongue every 5 (five) minutes x 3 doses as needed for chest pain (if no relief after 3rd dose, proceed to the ED for an evaluation or call 911). 04/21/20  Yes Verta Ellen., NP  warfarin (COUMADIN) 7.5 MG tablet TAKE  1 TABLET DAILY AS DIRECTED BY COUMADIN CLINIC Patient taking differently: Take 7.5 mg by mouth See admin instructions. TAKE  1 TABLET DAILY AS DIRECTED BY COUMADIN CLINIC 03/30/21  Yes Satira Sark, MD  chlorhexidine (PERIDEX) 0.12 % solution Use as directed 15 mLs in the mouth or throat 2 (two) times daily. Patient not taking: Reported on 11/11/2021 09/02/21   Volney American, PA-C  lidocaine (XYLOCAINE) 2 % solution Use as directed 10 mLs in the mouth or throat every 3 (three) hours as needed for mouth pain. Patient not taking: Reported on 11/11/2021 09/02/21   Volney American, PA-C     Allergies:     Allergies  Allergen Reactions   Augmentin [Amoxicillin-Pot Clavulanate] Nausea And Vomiting and Other (See Comments)    Has patient had a PCN reaction causing immediate rash, facial/tongue/throat swelling, SOB or lightheadedness with hypotension: Unknown Has patient had a PCN reaction causing severe rash involving mucus membranes or skin necrosis: No Has patient had a PCN reaction that required hospitalization: No Has patient had a PCN reaction occurring within the last 10 years: No If all of the above answers are "NO", then may proceed with Cephalosporin use.      Physical Exam:   Vitals  Blood pressure (!) 147/68, pulse 67,  temperature 97.7 F (36.5 C), temperature source Oral, resp. rate 20, height '5\' 11"'$  (1.803 m),  weight 104.3 kg, SpO2 93 %.  Physical Examination: General appearance - alert, and in no distress Mental status - alert, oriented to person, place, and time,  Eyes - sclera anicteric Neck - supple, no JVD elevation , Chest -diminished breath sounds, faint basilar Rales,  Heart - S1 and S2 normal, irregularly irregular, not tachycardic Abdomen - soft, nontender, nondistended, no masses or organomegaly Neurological - screening mental status exam normal, neck supple without rigidity, cranial nerves II through XII intact, DTR's normal and symmetric Extremities -1+ pitting pedal edema noted, intact peripheral pulses  Skin - warm, dry     Data Review:    CBC Recent Labs  Lab 11/11/21 0825  WBC 6.8  HGB 10.8*  HCT 32.4*  PLT 209  MCV 108.0*  MCH 36.0*  MCHC 33.3  RDW 16.1*    Chemistries  Recent Labs  Lab 11/11/21 0825  NA 131*  K 3.5  CL 98  CO2 24  GLUCOSE 115*  BUN 11  CREATININE 0.66  CALCIUM 8.6*  AST 30  ALT 31  ALKPHOS 209*  BILITOT 1.3*   ------------------------------------------------------------------------------------------------------------------ estimated creatinine clearance is 90.5 mL/min (by C-G formula based on SCr of 0.66 mg/dL). ------------------------------------------------------------------------------------------------------------------ Recent Labs    11/11/21 1526  TSH 1.789     Coagulation profile Recent Labs  Lab 11/11/21 0825  INR 2.1*   ------------------------------------------------------------------------------------------------------------------- No results for input(s): DDIMER in the last 72 hours. -------------------------------------------------------------------------------------------------------------------  Cardiac Enzymes No results for input(s): CKMB, TROPONINI, MYOGLOBIN in the last 168 hours.  Invalid input(s):  CK ------------------------------------------------------------------------------------------------------------------    Component Value Date/Time   BNP 152.0 (H) 11/11/2021 0825   Urinalysis    Component Value Date/Time   COLORURINE YELLOW 04/09/2019 1105   APPEARANCEUR CLEAR 04/09/2019 1105   LABSPEC 1.011 04/09/2019 1105   PHURINE 5.0 04/09/2019 1105   GLUCOSEU NEGATIVE 04/09/2019 1105   HGBUR NEGATIVE 04/09/2019 1105   BILIRUBINUR NEGATIVE 04/09/2019 1105   KETONESUR NEGATIVE 04/09/2019 1105   PROTEINUR NEGATIVE 04/09/2019 1105   UROBILINOGEN 0.2 11/28/2010 0915   NITRITE NEGATIVE 04/09/2019 1105   LEUKOCYTESUR NEGATIVE 04/09/2019 1105    Imaging Results:    DG Chest Port 1 View  Result Date: 11/11/2021 CLINICAL DATA:  81 year old male with history of chest pain with exertion. EXAM: PORTABLE CHEST 1 VIEW COMPARISON:  Chest x-ray 04/07/2020. FINDINGS: There is cephalization of the pulmonary vasculature and slight indistinctness of the interstitial markings suggestive of mild pulmonary edema. No definite pleural effusions. Mild cardiomegaly. Upper mediastinal contours are within normal limits. Atherosclerotic calcifications in the thoracic aorta. Status post median sternotomy for CABG. IMPRESSION: 1. The appearance the chest suggest congestive heart failure, as above. 2. Aortic atherosclerosis. Electronically Signed   By: Vinnie Langton M.D.   On: 11/11/2021 09:32    Radiological Exams on Admission: DG Chest Port 1 View  Result Date: 11/11/2021 CLINICAL DATA:  81 year old male with history of chest pain with exertion. EXAM: PORTABLE CHEST 1 VIEW COMPARISON:  Chest x-ray 04/07/2020. FINDINGS: There is cephalization of the pulmonary vasculature and slight indistinctness of the interstitial markings suggestive of mild pulmonary edema. No definite pleural effusions. Mild cardiomegaly. Upper mediastinal contours are within normal limits. Atherosclerotic calcifications in the thoracic  aorta. Status post median sternotomy for CABG. IMPRESSION: 1. The appearance the chest suggest congestive heart failure, as above. 2. Aortic atherosclerosis. Electronically Signed   By: Mauri Brooklyn.D.  On: 11/11/2021 09:32    DVT Prophylaxis -SCD/Coumadin AM Labs Ordered, also please review Full Orders  Family Communication: Admission, patients condition and plan of care including tests being ordered have been discussed with the patient who indicate understanding and agree with the plan   Code Status - Full Code  Likely DC to  home after CV work-up  Condition  --stable  Roxan Hockey M.D on 11/11/2021 at 4:41 PM Go to [email or web address] -  for contact info  [facility] - Office  [phone]

## 2021-11-12 ENCOUNTER — Observation Stay (HOSPITAL_COMMUNITY): Payer: Medicare HMO

## 2021-11-12 ENCOUNTER — Observation Stay (HOSPITAL_BASED_OUTPATIENT_CLINIC_OR_DEPARTMENT_OTHER): Payer: Medicare HMO

## 2021-11-12 DIAGNOSIS — Z79899 Other long term (current) drug therapy: Secondary | ICD-10-CM | POA: Diagnosis not present

## 2021-11-12 DIAGNOSIS — I350 Nonrheumatic aortic (valve) stenosis: Secondary | ICD-10-CM

## 2021-11-12 DIAGNOSIS — I272 Pulmonary hypertension, unspecified: Secondary | ICD-10-CM | POA: Diagnosis not present

## 2021-11-12 DIAGNOSIS — I5033 Acute on chronic diastolic (congestive) heart failure: Secondary | ICD-10-CM | POA: Diagnosis not present

## 2021-11-12 DIAGNOSIS — I5020 Unspecified systolic (congestive) heart failure: Secondary | ICD-10-CM

## 2021-11-12 DIAGNOSIS — R072 Precordial pain: Secondary | ICD-10-CM | POA: Diagnosis not present

## 2021-11-12 DIAGNOSIS — D638 Anemia in other chronic diseases classified elsewhere: Secondary | ICD-10-CM | POA: Diagnosis not present

## 2021-11-12 DIAGNOSIS — R079 Chest pain, unspecified: Secondary | ICD-10-CM

## 2021-11-12 DIAGNOSIS — L03115 Cellulitis of right lower limb: Secondary | ICD-10-CM | POA: Diagnosis not present

## 2021-11-12 DIAGNOSIS — I509 Heart failure, unspecified: Secondary | ICD-10-CM

## 2021-11-12 DIAGNOSIS — R262 Difficulty in walking, not elsewhere classified: Secondary | ICD-10-CM

## 2021-11-12 DIAGNOSIS — D539 Nutritional anemia, unspecified: Secondary | ICD-10-CM | POA: Diagnosis not present

## 2021-11-12 DIAGNOSIS — I110 Hypertensive heart disease with heart failure: Secondary | ICD-10-CM | POA: Diagnosis not present

## 2021-11-12 DIAGNOSIS — Z20822 Contact with and (suspected) exposure to covid-19: Secondary | ICD-10-CM | POA: Diagnosis not present

## 2021-11-12 DIAGNOSIS — R2689 Other abnormalities of gait and mobility: Secondary | ICD-10-CM | POA: Diagnosis present

## 2021-11-12 DIAGNOSIS — E871 Hypo-osmolality and hyponatremia: Secondary | ICD-10-CM | POA: Diagnosis not present

## 2021-11-12 DIAGNOSIS — L0390 Cellulitis, unspecified: Secondary | ICD-10-CM

## 2021-11-12 DIAGNOSIS — I255 Ischemic cardiomyopathy: Secondary | ICD-10-CM | POA: Diagnosis present

## 2021-11-12 DIAGNOSIS — I482 Chronic atrial fibrillation, unspecified: Secondary | ICD-10-CM | POA: Diagnosis not present

## 2021-11-12 DIAGNOSIS — F1010 Alcohol abuse, uncomplicated: Secondary | ICD-10-CM

## 2021-11-12 DIAGNOSIS — Z7901 Long term (current) use of anticoagulants: Secondary | ICD-10-CM | POA: Diagnosis not present

## 2021-11-12 DIAGNOSIS — I739 Peripheral vascular disease, unspecified: Secondary | ICD-10-CM | POA: Diagnosis present

## 2021-11-12 DIAGNOSIS — M545 Low back pain, unspecified: Secondary | ICD-10-CM | POA: Diagnosis present

## 2021-11-12 DIAGNOSIS — G629 Polyneuropathy, unspecified: Secondary | ICD-10-CM | POA: Diagnosis present

## 2021-11-12 DIAGNOSIS — I2510 Atherosclerotic heart disease of native coronary artery without angina pectoris: Secondary | ICD-10-CM | POA: Diagnosis present

## 2021-11-12 DIAGNOSIS — I4821 Permanent atrial fibrillation: Secondary | ICD-10-CM | POA: Diagnosis not present

## 2021-11-12 DIAGNOSIS — G8929 Other chronic pain: Secondary | ICD-10-CM | POA: Diagnosis present

## 2021-11-12 DIAGNOSIS — L03116 Cellulitis of left lower limb: Secondary | ICD-10-CM | POA: Diagnosis not present

## 2021-11-12 DIAGNOSIS — R0789 Other chest pain: Secondary | ICD-10-CM | POA: Diagnosis not present

## 2021-11-12 DIAGNOSIS — E785 Hyperlipidemia, unspecified: Secondary | ICD-10-CM | POA: Diagnosis present

## 2021-11-12 DIAGNOSIS — Z7982 Long term (current) use of aspirin: Secondary | ICD-10-CM | POA: Diagnosis not present

## 2021-11-12 LAB — BASIC METABOLIC PANEL
Anion gap: 6 (ref 5–15)
BUN: 9 mg/dL (ref 8–23)
CO2: 27 mmol/L (ref 22–32)
Calcium: 8.6 mg/dL — ABNORMAL LOW (ref 8.9–10.3)
Chloride: 102 mmol/L (ref 98–111)
Creatinine, Ser: 0.84 mg/dL (ref 0.61–1.24)
GFR, Estimated: >60 mL/min (ref >60)
Glucose, Bld: 95 mg/dL (ref 70–99)
Potassium: 3.9 mmol/L (ref 3.5–5.1)
Sodium: 135 mmol/L (ref 135–145)

## 2021-11-12 LAB — NM MYOCAR MULTI W/SPECT W/WALL MOTION / EF
Base ST Depression (mm): 0 mm
Estimated workload: 1
Exercise duration (min): 0 min
Exercise duration (sec): 0 s
MPHR: 140 {beats}/min
Peak HR: 82 {beats}/min
Percent HR: 58 %
Rest HR: 72 {beats}/min
ST Depression (mm): 0 mm

## 2021-11-12 LAB — ECHOCARDIOGRAM COMPLETE
AR max vel: 1.41 cm2
AV Area VTI: 1.56 cm2
AV Area mean vel: 1.51 cm2
AV Mean grad: 27 mmHg
AV Peak grad: 46.8 mmHg
Ao pk vel: 3.42 m/s
Area-P 1/2: 5.31 cm2
Height: 71 in
MV VTI: 2.75 cm2
S' Lateral: 3.1 cm
Weight: 3534.41 oz

## 2021-11-12 LAB — IRON AND TIBC
Iron: 125 ug/dL (ref 45–182)
Saturation Ratios: 44 % — ABNORMAL HIGH (ref 17.9–39.5)
TIBC: 286 ug/dL (ref 250–450)
UIBC: 161 ug/dL

## 2021-11-12 LAB — PROTIME-INR
INR: 1.7 — ABNORMAL HIGH (ref 0.8–1.2)
Prothrombin Time: 20.4 seconds — ABNORMAL HIGH (ref 11.4–15.2)

## 2021-11-12 LAB — CBC
HCT: 30.3 % — ABNORMAL LOW (ref 39.0–52.0)
Hemoglobin: 10.3 g/dL — ABNORMAL LOW (ref 13.0–17.0)
MCH: 36.5 pg — ABNORMAL HIGH (ref 26.0–34.0)
MCHC: 34 g/dL (ref 30.0–36.0)
MCV: 107.4 fL — ABNORMAL HIGH (ref 80.0–100.0)
Platelets: 189 10*3/uL (ref 150–400)
RBC: 2.82 MIL/uL — ABNORMAL LOW (ref 4.22–5.81)
RDW: 16 % — ABNORMAL HIGH (ref 11.5–15.5)
WBC: 7.4 10*3/uL (ref 4.0–10.5)
nRBC: 0.3 % — ABNORMAL HIGH (ref 0.0–0.2)

## 2021-11-12 LAB — LIPID PANEL
Cholesterol: 110 mg/dL (ref 0–200)
HDL: 74 mg/dL (ref >40)
LDL Cholesterol: 31 mg/dL (ref 0–99)
Total CHOL/HDL Ratio: 1.5 RATIO
Triglycerides: 23 mg/dL (ref <150)
VLDL: 5 mg/dL (ref 0–40)

## 2021-11-12 MED ORDER — WARFARIN SODIUM 5 MG PO TABS
10.0000 mg | ORAL_TABLET | Freq: Once | ORAL | Status: AC
  Administered 2021-11-12: 10 mg via ORAL
  Filled 2021-11-12: qty 2

## 2021-11-12 MED ORDER — TECHNETIUM TC 99M TETROFOSMIN IV KIT
31.9000 | PACK | Freq: Once | INTRAVENOUS | Status: AC | PRN
  Administered 2021-11-12: 31.9 via INTRAVENOUS

## 2021-11-12 MED ORDER — CEPHALEXIN 500 MG PO CAPS
500.0000 mg | ORAL_CAPSULE | Freq: Three times a day (TID) | ORAL | Status: DC
Start: 2021-11-12 — End: 2021-11-13
  Administered 2021-11-12 – 2021-11-13 (×5): 500 mg via ORAL
  Filled 2021-11-12 (×5): qty 1

## 2021-11-12 MED ORDER — TECHNETIUM TC 99M TETROFOSMIN IV KIT
10.4000 | PACK | Freq: Once | INTRAVENOUS | Status: AC | PRN
  Administered 2021-11-12: 10.4 via INTRAVENOUS

## 2021-11-12 MED ORDER — REGADENOSON 0.4 MG/5ML IV SOLN
INTRAVENOUS | Status: AC
  Administered 2021-11-12: 0.4 mg via INTRAVENOUS
  Filled 2021-11-12: qty 5

## 2021-11-12 MED ORDER — WARFARIN SODIUM 5 MG PO TABS: 10.0000 mg | ORAL_TABLET | Freq: Once | ORAL | Status: DC

## 2021-11-12 MED ORDER — REGADENOSON 0.4 MG/5ML IV SOLN
0.4000 mg | Freq: Once | INTRAVENOUS | Status: AC
  Filled 2021-11-12: qty 5

## 2021-11-12 NOTE — Progress Notes (Signed)
ANTICOAGULATION CONSULT NOTE ? ?Pharmacy Consult for warfarin ?Indication: atrial fibrillation ? ?Allergies  ?Allergen Reactions  ? Augmentin [Amoxicillin-Pot Clavulanate] Nausea And Vomiting and Other (See Comments)  ?  Has patient had a PCN reaction causing immediate rash, facial/tongue/throat swelling, SOB or lightheadedness with hypotension: Unknown ?Has patient had a PCN reaction causing severe rash involving mucus membranes or skin necrosis: No ?Has patient had a PCN reaction that required hospitalization: No ?Has patient had a PCN reaction occurring within the last 10 years: No ?If all of the above answers are "NO", then may proceed with Cephalosporin use. ?  ? ? ?Patient Measurements: ?Height: '5\' 11"'$  (180.3 cm) ?Weight: 100.2 kg (220 lb 14.4 oz) ?IBW/kg (Calculated) : 75.3 ? ?Vital Signs: ?Temp: 98.5 ?F (36.9 ?C) (03/12 1254) ?Temp Source: Oral (03/12 1254) ?BP: 121/65 (03/12 1300) ?Pulse Rate: 83 (03/12 1300) ? ?Labs: ?Recent Labs  ?  11/11/21 ?0825 11/11/21 ?1034 11/12/21 ?1975  ?HGB 10.8*  --  10.3*  ?HCT 32.4*  --  30.3*  ?PLT 209  --  189  ?LABPROT 23.6*  --   --   ?INR 2.1*  --   --   ?CREATININE 0.66  --  0.84  ?TROPONINIHS 12 12  --   ? ? ? ?Estimated Creatinine Clearance: 84.6 mL/min (by C-G formula based on SCr of 0.84 mg/dL). ? ? ?Medical History: ?Past Medical History:  ?Diagnosis Date  ? Aortic stenosis   ? Atrial flutter ([place])   ? a. s/p remote ablation by Dr. [name].  ? CAD (coronary artery disease)   ? a. s/p CABG 2003. b. 01/2016: unstable angina - s/p BMS to SVG-ramus intermediate, patent LIMA-mLAD, patent RIMA-dRCA.   ? Chronic lower back pain   ? Essential hypertension   ? Hyperlipidemia   ? Ischemic cardiomyopathy   ? a. Cath 01/2016 -  EF 50-55% with mild distal inferior hypocontractility.  ? Peripheral neuropathy 04/04/2014  ? Permanent atrial fibrillation ([place])   ? ?Assessment: ?68 yoM on warfarin PTA for hx AF admitted with CP. Pharmacy consulted to dose warfarin.  ? ?Home warfarin  dose: 11.'25mg'$  on Tuesdays, 7.5 mg all other days ? ?INR today 1.7 and below goal. No signs of bleeding noted.  ? ?Goal of Therapy:  ?INR 2-3 ?Monitor platelets by anticoagulation protocol: Yes ?  ?Plan:  ?Warfarin 10 mg PO x 1 ?Daily INR, CBC ?Monitor for signs and symptoms of bleeding ? ?Thank you for involving pharmacy in this patient's care. ? ?Elita Quick, PharmD ?PGY1 Ambulatory Care Pharmacy Resident ?11/12/2021 1:23 PM ? ?**Pharmacist phone directory can be found on [email or web address] listed under [place]** ?

## 2021-11-12 NOTE — Assessment & Plan Note (Addendum)
Hx CABG 2003, 01/2016 s/p BMS to SVG - ramus intermediate, patent LIMA-mLAD, patent RIMA-RCA ?Echo with EF 55-60%, no RWMA, severely elevated PASP, IVC dilated with <50% resp variability ?Appreciate cardiology c/s -  No plans for further inpatient workup, normal myovue, follow outpatient with cardiology ?

## 2021-11-12 NOTE — Progress Notes (Signed)
? ?  Leonard Cisneros presented for a nuclear stress test today.  No immediate complications.  Stress imaging is pending at this time. ? ?Preliminary EKG findings may be listed in the chart, but the stress test result will not be finalized until perfusion imaging is complete. ? ?One day study, [facility] radiology to read. ? ?Abigail Butts, PA-C ?11/12/2021, 12:00 PM  ? ?

## 2021-11-12 NOTE — Assessment & Plan Note (Signed)
Started on abx per cards for LE cellulitis ?

## 2021-11-12 NOTE — Assessment & Plan Note (Signed)
Walks with cane at baseline ?

## 2021-11-12 NOTE — Assessment & Plan Note (Addendum)
CXR concerning for CHF ?Improved with IV lasix, discharge with Po lasix ?Echo with normal EF, severely elevated PASP, IVC dilated with <50% resp variability ?Strict I/O, daily weights ?Per cards, may consider RHC due to pulm HTN - follow outpatient to discuss further ?

## 2021-11-12 NOTE — Assessment & Plan Note (Addendum)
Per cards, plan to transition to NOAC from warfarin -> will change to eliquis at discharge ?Continue metoprolol and diltiazem ?

## 2021-11-12 NOTE — Progress Notes (Signed)
PT Cancellation Note ? ?Patient Details ?Name: Leonard Cisneros ?MRN: [id number] ?DOB: [birth year] ? ? ?Cancelled Treatment:    Reason Eval/Treat Not Completed: Patient at procedure or test/unavailable (stress test). ? ?Wyona Almas, PT, DPT ?Acute Rehabilitation Services ?Pager [phone] ?Office [phone] ? ? ? ?Leonard Cisneros ?11/12/2021, 11:54 AM ?

## 2021-11-12 NOTE — Hospital Course (Addendum)
81 yo with hx atrial fibrillation, CAD s/p CABG, s/p BMS to SVG-ramus intermediate on 01/2016, HTN, etoh abuse and multiple other medical problems who presented with CP and LE edema.  He was seen by cardiology and had stress test which was normal.  He improved with IV lasix.  Plan is for discharge with outpatient follow up. ? ?See below for additional details ?

## 2021-11-12 NOTE — Assessment & Plan Note (Addendum)
B12, folate wnl.  Iron studies c/w AOCD. ?Related to etoh  ?

## 2021-11-12 NOTE — Care Management Obs Status (Signed)
MEDICARE OBSERVATION STATUS NOTIFICATION ? ? ?Patient Details  ?Name: Leonard Cisneros ?MRN: [id number] ?Date of Birth: [birth year] ? ? ?Medicare Observation Status Notification Given:   Yes ? ? ? ?Aziya Arena G., RN ?11/12/2021, 10:30 AM ?

## 2021-11-12 NOTE — Progress Notes (Signed)
PROGRESS NOTE    Leonard Cisneros  WSF:[id number] DOB: 1941/05/24 DOA: 11/11/2021 PCP: Erven Colla, DO  Chief Complaint  Patient presents with   Chest Pain    Brief Narrative:  81 yo with hx atrial fibrillation, CAD s/p CABG, s/p BMS to SVG-ramus intermediate on 01/2016, HTN, etoh abuse and multiple other medical problems who presented with CP and LE edema.    Assessment & Plan:   Principal Problem:   Chest pain Active Problems:   Hx of CABG   Acute on chronic diastolic CHF (congestive heart failure) (HCC)   Chronic atrial fibrillation (HCC)   Cellulitis   Essential hypertension   PAD (peripheral artery disease) ([place])   Long term (current) use of anticoagulants   Macrocytic anemia   ETOH abuse   Ambulatory dysfunction   Severe pulmonary hypertension (HCC)   Moderate aortic stenosis   Assessment and Plan: * Chest pain Hx CABG 2003, 01/2016 s/p BMS to SVG - ramus intermediate, patent LIMA-mLAD, patent RIMA-RCA Echo with EF 55-60%, no RWMA, severely elevated PASP, IVC dilated with <50% resp variability Appreciate cardiology c/s - pending myoview   Acute on chronic diastolic CHF (congestive heart failure) ([place]) CXR concerning for CHF BID lasix 40 mg per cardiology Echo with normal EF, severely elevated PASP, IVC dilated with <50% resp variability Strict I/O, daily weights Per cards, may consider RHC due to pulm HTN  Cellulitis Started on abx per cards for LE cellulitis  Chronic atrial fibrillation ([place]) Per cards, plan to transition to NOAC from warfarin Continue metoprolol and diltiazem  Essential hypertension Continue metop, diltiazem, lasix  Macrocytic anemia B12, folate wnl.  Iron studies c/w AOCD. Related to etoh   ETOH abuse CIWA  Ambulatory dysfunction Walks with cane at baseline   DVT prophylaxis: warfarin Code Status: full Family Communication: none Disposition:   Status is: Observation The patient will require care spanning > 2  midnights and should be moved to inpatient because: need for cards eval, pending myoview, continued diuresiss   Consultants:  card  Procedures:  Echo IMPRESSIONS     1. Left ventricular ejection fraction, by estimation, is 55 to 60%. The  left ventricle has normal function. The left ventricle has no regional  wall motion abnormalities. There is mild left ventricular hypertrophy.  Diastolic function indeterminant due to   severe MAC. Elevated left atrial pressure.   2. Right ventricular systolic function is normal. The right ventricular  size is mildly enlarged. There is severely elevated pulmonary artery  systolic pressure. The estimated right ventricular systolic pressure is  01.7 mmHg.   3. Left atrial size was severely dilated.   4. Right atrial size was moderately dilated.   5. The mitral valve is abnormal. Mild mitral valve regurgitation. Severe  mitral annular calcification.   6. The aortic valve has an indeterminant number of cusps. There is  moderate calcification of the aortic valve. There is severe thickening of  the aortic valve. Aortic valve regurgitation is not visualized. Moderate  aortic valve stenosis. Aortic valve  mean gradient measures 27.0 mmHg. Aortic valve Vmax measures 3.42 m/s. AVA  by continuity 1.44cm2, DI 0.31   7. Aortic dilatation noted. There is mild dilatation of the ascending  aorta, measuring 41 mm.   8. The inferior vena cava is dilated in size with <50% respiratory  variability, suggesting right atrial pressure of 15 mmHg.   Comparison(s): Compared to prior TTE 03/2021, there continues to be  moderate AS (previous mean graident 25mHg;  now 29mHg). There is now  severe pulmonary HTN (previously not reported).   Antimicrobials:  Anti-infectives (From admission, onward)    Start     Dose/Rate Route Frequency Ordered Stop   11/12/21 0945  cephALEXin (KEFLEX) capsule 500 mg        500 mg Oral Every 8 hours 11/12/21 0852          Subjective: No new complaints  Objective: Vitals:   11/12/21 1142 11/12/21 1254 11/12/21 1300 11/12/21 1346  BP: 139/67 121/65 121/65   Pulse: 74  83   Resp:  20 20   Temp:  98.5 F (36.9 C)  98 F (36.7 C)  TempSrc:  Oral  Oral  SpO2:   96% 96%  Weight:      Height:        Intake/Output Summary (Last 24 hours) at 11/12/2021 1433 Last data filed at 11/12/2021 0919 Gross per 24 hour  Intake --  Output 2125 ml  Net -2125 ml   Filed Weights   11/11/21 0826 11/12/21 0548  Weight: 104.3 kg 100.2 kg    Examination:  General exam: Appears calm and comfortable  Respiratory system: unlabored Cardiovascular system: RRR Gastrointestinal system: Abdomen is nondistended, soft and nontender. Central nervous system: Alert and oriented. No focal neurological deficits. Extremities: ted hose on, edema appears to have improved Skin: No rashes, lesions or ulcers Psychiatry: Judgement and insight appear normal. Mood & affect appropriate.     Data Reviewed: I have personally reviewed following labs and imaging studies  CBC: Recent Labs  Lab 11/11/21 0825 11/12/21 0042  WBC 6.8 7.4  HGB 10.8* 10.3*  HCT 32.4* 30.3*  MCV 108.0* 107.4*  PLT 209 1371   Basic Metabolic Panel: Recent Labs  Lab 11/11/21 0825 11/12/21 0042  NA 131* 135  K 3.5 3.9  CL 98 102  CO2 24 27  GLUCOSE 115* 95  BUN 11 9  CREATININE 0.66 0.84  CALCIUM 8.6* 8.6*    GFR: Estimated Creatinine Clearance: 84.6 mL/min (by C-G formula based on SCr of 0.84 mg/dL).  Liver Function Tests: Recent Labs  Lab 11/11/21 0825  AST 30  ALT 31  ALKPHOS 209*  BILITOT 1.3*  PROT 7.1  ALBUMIN 3.9    CBG: No results for input(s): GLUCAP in the last 168 hours.   Recent Results (from the past 240 hour(s))  Resp Panel by RT-PCR (Flu Zoanne Newill&B, Covid) Nasopharyngeal Swab     Status: None   Collection Time: 11/11/21  8:51 AM   Specimen: Nasopharyngeal Swab; Nasopharyngeal(NP) swabs in vial transport medium   Result Value Ref Range Status   SARS Coronavirus 2 by RT PCR NEGATIVE NEGATIVE Final    Comment: (NOTE) SARS-CoV-2 target nucleic acids are NOT DETECTED.  The SARS-CoV-2 RNA is generally detectable in upper respiratory specimens during the acute phase of infection. The lowest concentration of SARS-CoV-2 viral copies this assay can detect is 138 copies/mL. Trea Carnegie negative result does not preclude SARS-Cov-2 infection and should not be used as the sole basis for treatment or other patient management decisions. Almond Fitzgibbon negative result may occur with  improper specimen collection/handling, submission of specimen other than nasopharyngeal swab, presence of viral mutation(s) within the areas targeted by this assay, and inadequate number of viral copies(<138 copies/mL). Cortlan Dolin negative result must be combined with clinical observations, patient history, and epidemiological information. The expected result is Negative.  Fact Sheet for Patients:  hEntrepreneurPulse.com.au Fact Sheet for Healthcare Providers:  hIncredibleEmployment.be This test is  no t yet approved or cleared by the [place] and  has been authorized for detection and/or diagnosis of SARS-CoV-2 by FDA under an Emergency Use Authorization (EUA). This EUA will remain  in effect (meaning this test can be used) for the duration of the COVID-19 declaration under Section 564(b)(1) of the Act, 21 U.S.C.section 360bbb-3(b)(1), unless the authorization is terminated  or revoked sooner.       Influenza Wane Mollett by PCR NEGATIVE NEGATIVE Final   Influenza B by PCR NEGATIVE NEGATIVE Final    Comment: (NOTE) The Xpert Xpress SARS-CoV-2/FLU/RSV plus assay is intended as an aid in the diagnosis of influenza from Nasopharyngeal swab specimens and should not be used as [place] sole basis for treatment. Nasal washings and aspirates are unacceptable for Xpert Xpress SARS-CoV-2/FLU/RSV testing.  Fact Sheet for  Patients: EntrepreneurPulse.com.au  Fact Sheet for Healthcare Providers: IncredibleEmployment.be  This test is not yet approved or cleared by the [place] FDA and has been authorized for detection and/or diagnosis of SARS-CoV-2 by FDA under an Emergency Use Authorization (EUA). This EUA will remain in effect (meaning this test can be used) for the duration of the COVID-19 declaration under Section 564(b)(1) of the Act, 21 U.S.C. section 360bbb-3(b)(1), unless the authorization is terminated or revoked.  Performed at [facility], [address], [place], [place] [address]          Radiology Studies: [facility] Chest [place] 1 View  Result Date: 11/11/2021 CLINICAL DATA:  81 year old male with history of chest pain with exertion. EXAM: PORTABLE CHEST 1 VIEW COMPARISON:  Chest x-ray 04/07/2020. FINDINGS: There is cephalization of the pulmonary vasculature and slight indistinctness of the interstitial markings suggestive of mild pulmonary edema. No definite pleural effusions. Mild cardiomegaly. Upper mediastinal contours are within normal limits. Atherosclerotic calcifications in the thoracic aorta. Status post median sternotomy for CABG. IMPRESSION: 1. The appearance the chest suggest congestive heart failure, as above. 2. Aortic atherosclerosis. Electronically Signed   By: Vinnie Langton M.D.   On: 11/11/2021 09:32   ECHOCARDIOGRAM COMPLETE  Result Date: 11/12/2021    ECHOCARDIOGRAM REPORT   Patient Name:   Leonard Cisneros Date of Exam: 11/12/2021 Medical Rec #:  [id number]        Height:       71.0 in Accession #:    [id number]       Weight:       220.9 lb Date of Birth:  Jan 28, 1941       BSA:          2.200 m Patient Age:    30 years         BP:           117/59 mmHg Patient Gender: M                HR:           77 bpm. Exam Location:  Inpatient Procedure: 2D Echo Indications:    Chest pain  History:        Patient has prior history of Echocardiogram  examinations, most                 recent 03/23/2021. Cardiomyopathy, CAD, Aortic Valve Disease,                 Arrythmias:Atrial Fibrillation and Atrial Flutter; Risk                 Factors:Hypertension and Dyslipidemia.  Sonographer:    Arlyss Gandy Referring  Phys: [id number] COURAGE EMOKPAE  Sonographer Comments: Image acquisition challenging due to patient body habitus. IMPRESSIONS  1. Left ventricular ejection fraction, by estimation, is 55 to 60%. The left ventricle has normal function. The left ventricle has no regional wall motion abnormalities. There is mild left ventricular hypertrophy. Diastolic function indeterminant due to  severe MAC. Elevated left atrial pressure.  2. Right ventricular systolic function is normal. The right ventricular size is mildly enlarged. There is severely elevated pulmonary artery systolic pressure. The estimated right ventricular systolic pressure is 81.8 mmHg.  3. Left atrial size was severely dilated.  4. Right atrial size was moderately dilated.  5. The mitral valve is abnormal. Mild mitral valve regurgitation. Severe mitral annular calcification.  6. The aortic valve has an indeterminant number of cusps. There is moderate calcification of the aortic valve. There is severe thickening of the aortic valve. Aortic valve regurgitation is not visualized. Moderate aortic valve stenosis. Aortic valve mean gradient measures 27.0 mmHg. Aortic valve Vmax measures 3.42 m/s. AVA by continuity 1.44cm2, DI 0.31  7. Aortic dilatation noted. There is mild dilatation of the ascending aorta, measuring 41 mm.  8. The inferior vena cava is dilated in size with <50% respiratory variability, suggesting right atrial pressure of 15 mmHg. Comparison(s): Compared to prior TTE 03/2021, there continues to be moderate AS (previous mean graident 32mHg; now 239mg). There is now severe pulmonary HTN (previously not reported). FINDINGS  Left Ventricle: Left ventricular ejection fraction, by estimation, is 55  to 60%. The left ventricle has normal function. The left ventricle has no regional wall motion abnormalities. The left ventricular internal cavity size was normal in size. There is  mild left ventricular hypertrophy. Diastolic function indeterminant due to severe MAC. Elevated left atrial pressure. Right Ventricle: The right ventricular size is mildly enlarged. No increase in right ventricular wall thickness. Right ventricular systolic function is normal. There is severely elevated pulmonary artery systolic pressure. The tricuspid regurgitant velocity is 3.70 m/s, and with an assumed right atrial pressure of 15 mmHg, the estimated right ventricular systolic pressure is 6929.9mHg. Left Atrium: Left atrial size was severely dilated. Right Atrium: Right atrial size was moderately dilated. Pericardium: There is no evidence of pericardial effusion. Mitral Valve: The mitral valve is abnormal. There is mild thickening of the mitral valve leaflet(s). There is moderate calcification of the mitral valve leaflet(s). Severe mitral annular calcification. Mild mitral valve regurgitation. MV peak gradient, 15.2 mmHg. The mean mitral valve gradient is 4.0 mmHg. Tricuspid Valve: The tricuspid valve is normal in structure. Tricuspid valve regurgitation is trivial. Aortic Valve: The aortic valve has an indeterminant number of cusps. There is moderate calcification of the aortic valve. There is severe thickening of the aortic valve. Aortic valve regurgitation is trivial. Moderate aortic stenosis is present. Aortic valve mean gradient measures 27.0 mmHg. Aortic valve peak gradient measures 46.8 mmHg. Aortic valve area, by VTI measures 1.56 cm. Pulmonic Valve: The pulmonic valve was not well visualized. Pulmonic valve regurgitation is trivial. Aorta: Aortic dilatation noted. There is mild dilatation of the ascending aorta, measuring 41 mm. Venous: The inferior vena cava is dilated in size with less than 50% respiratory variability,  suggesting right atrial pressure of 15 mmHg. IAS/Shunts: The atrial septum is grossly normal.  LEFT VENTRICLE PLAX 2D LVIDd:         4.40 cm   Diastology LVIDs:         3.10 cm   LV e' medial:    7.07 cm/s  LV PW:         1.50 cm   LV E/e' medial:  26.6 LV IVS:        1.50 cm   LV e' lateral:   8.16 cm/s LVOT diam:     2.40 cm   LV E/e' lateral: 23.0 LV SV:         109 LV SV Index:   50 LVOT Area:     4.52 cm  RIGHT VENTRICLE            IVC RV Basal diam:  4.40 cm    IVC diam: 2.80 cm RV Mid diam:    3.80 cm RV S prime:     9.90 cm/s TAPSE (M-mode): 1.6 cm LEFT ATRIUM              Index        RIGHT ATRIUM           Index LA Vol (A2C):   161.0 ml 73.20 ml/m  RA Area:     22.70 cm LA Vol (A4C):   153.0 ml 69.56 ml/m  RA Volume:   60.60 ml  27.55 ml/m LA Biplane Vol: 164.0 ml 74.56 ml/m  AORTIC VALVE AV Area (Vmax):    1.41 cm AV Area (Vmean):   1.51 cm AV Area (VTI):     1.56 cm AV Vmax:           342.00 cm/s AV Vmean:          220.500 cm/s AV VTI:            0.700 m AV Peak Grad:      46.8 mmHg AV Mean Grad:      27.0 mmHg LVOT Vmax:         106.50 cm/s LVOT Vmean:        73.750 cm/s LVOT VTI:          0.242 m LVOT/AV VTI ratio: 0.34  AORTA Ao Root diam: 3.60 cm Ao Asc diam:  4.10 cm MITRAL VALVE                TRICUSPID VALVE MV Area (PHT): 5.31 cm     TR Peak grad:   54.8 mmHg MV Area VTI:   2.75 cm     TR Vmax:        370.00 cm/s MV Peak grad:  15.2 mmHg MV Mean grad:  4.0 mmHg     SHUNTS MV Vmax:       1.95 m/s     Systemic VTI:  0.24 m MV Vmean:      88.9 cm/s    Systemic Diam: 2.40 cm MV Decel Time: 143 msec MV E velocity: 188.00 cm/s MV Tanecia Mccay velocity: 51.40 cm/s MV E/Arrington Bencomo ratio:  3.66 Gwyndolyn Kaufman MD Electronically signed by Gwyndolyn Kaufman MD Signature Date/Time: 11/12/2021/12:22:43 PM    Final         Scheduled Meds:  atorvastatin  80 mg Oral Daily   cephALEXin  500 mg Oral Q8H   diltiazem  240 mg Oral Daily   folic acid  1 mg Oral Daily   furosemide  40 mg Intravenous BID   metoprolol  succinate  100 mg Oral Daily   multivitamin with minerals  1 tablet Oral Daily   sodium chloride flush  3 mL Intravenous Q12H   sodium chloride flush  3 mL Intravenous Q12H   thiamine  100 mg Oral Daily   Or   thiamine  100 mg Intravenous Daily   Warfarin - Pharmacist Dosing Inpatient   Does not apply q1600   Continuous Infusions:  sodium chloride       LOS: 0 days    Time spent: over 30 min    Fayrene Helper, MD [facility]   To contact the attending provider between 7A-7P or the covering provider during after hours 7P-7A, please log into the web site [email or web address] and access using universal [place] password for that web site. If you do not have the password, please call the hospital operator.  11/12/2021, 2:33 PM

## 2021-11-12 NOTE — Assessment & Plan Note (Signed)
CIWA 

## 2021-11-12 NOTE — Progress Notes (Signed)
? ? ? ? ? ?Patient Name: Leonard Cisneros ?  ? ?  ?SUBJECTIVE:feels much better following diuresis  ? ?Past Medical History:  ?Diagnosis Date  ? Aortic stenosis   ? Atrial flutter ([place])   ? a. s/p remote ablation by Dr. [name].  ? CAD (coronary artery disease)   ? a. s/p CABG 2003. b. 01/2016: unstable angina - s/p BMS to SVG-ramus intermediate, patent LIMA-mLAD, patent RIMA-dRCA.   ? Chronic lower back pain   ? Essential hypertension   ? Hyperlipidemia   ? Ischemic cardiomyopathy   ? a. Cath 01/2016 -  EF 50-55% with mild distal inferior hypocontractility.  ? Peripheral neuropathy 04/04/2014  ? Permanent atrial fibrillation ([place])   ? ? ?Scheduled Meds: ? ?Scheduled Meds: ? atorvastatin  80 mg Oral Daily  ? diltiazem  240 mg Oral Daily  ? folic acid  1 mg Oral Daily  ? furosemide  40 mg Intravenous BID  ? metoprolol succinate  100 mg Oral Daily  ? multivitamin with minerals  1 tablet Oral Daily  ? sodium chloride flush  3 mL Intravenous Q12H  ? sodium chloride flush  3 mL Intravenous Q12H  ? thiamine  100 mg Oral Daily  ? Or  ? thiamine  100 mg Intravenous Daily  ? Warfarin - Pharmacist Dosing Inpatient   Does not apply T0354  ? ?Continuous Infusions: ? sodium chloride    ? ?sodium chloride, acetaminophen **OR** acetaminophen, bisacodyl, HYDROcodone-acetaminophen, LORazepam **OR** LORazepam, melatonin, nitroGLYCERIN, ondansetron **OR** ondansetron (ZOFRAN) IV, polyethylene glycol, sodium chloride flush, traZODone ? ? ? ?PHYSICAL EXAM ?Vitals:  ? 11/11/21 1447 11/11/21 2028 11/12/21 0002 11/12/21 0548  ?BP:  128/60 (!) 117/53 (!) 117/59  ?Pulse:  (!) 56 (!) 56 77  ?Resp:   (!) 22 20  ?Temp: 97.7 ?F (36.5 ?C) 98.8 ?F (37.1 ?C) 97.9 ?F (36.6 ?C) 97.9 ?F (36.6 ?C)  ?TempSrc: Oral Oral Oral Oral  ?SpO2:  95% 90% 92%  ?Weight:    100.2 kg  ?Height:      ? ? ?Well developed and nourished in no acute distress ?HENT normal ?Neck supple with JVP-  8 ?Clear ?Regular rate and rhythm, no murmurs or gallops ?Abd-soft with active  BS ?No Clubbing cyanosis 1+edema ?Skin-warm and dry ?A & Oriented  Grossly normal sensory and motor function ? ?  ? ?TELEMETRY: Reviewed personnally pt in afib ?  ? ? ?Intake/Output Summary (Last 24 hours) at 11/12/2021 0850 ?Last data filed at 11/11/2021 2100 ?Gross per 24 hour  ?Intake --  ?Output 3000 ml  ?Net -3000 ml  ? ? ?LABS: ?Basic Metabolic Panel: ?Recent Labs  ?Lab 11/11/21 ?0825 11/12/21 ?6568  ?NA 131* 135  ?K 3.5 3.9  ?CL 98 102  ?CO2 24 27  ?GLUCOSE 115* 95  ?BUN 11 9  ?CREATININE 0.66 0.84  ?CALCIUM 8.6* 8.6*  ? ?Cardiac Enzymes: ?No results for input(s): CKTOTAL, CKMB, CKMBINDEX, TROPONINI in the last 72 hours. ?CBC: ?Recent Labs  ?Lab 11/11/21 ?0825 11/12/21 ?1275  ?WBC 6.8 7.4  ?HGB 10.8* 10.3*  ?HCT 32.4* 30.3*  ?MCV 108.0* 107.4*  ?PLT 209 189  ? ?PROTIME: ?Recent Labs  ?  11/11/21 ?0825  ?LABPROT 23.6*  ?INR 2.1*  ? ?Liver Function Tests: ?Recent Labs  ?  11/11/21 ?0825  ?AST 30  ?ALT 31  ?ALKPHOS 209*  ?BILITOT 1.3*  ?PROT 7.1  ?ALBUMIN 3.9  ? ?No results for input(s): LIPASE, AMYLASE in the last 72 hours. ?BNP: ?BNP (last 3 results) ?  Recent Labs  ?  11/11/21 ?0825  ?BNP 152.0*  ? ?  ?Recent Labs  ?  11/12/21 ?4196  ?CHOL 110  ?HDL 74  ?[place] 31  ?TRIG 23  ?CHOLHDL 1.5  ? ?Thyroid Function Tests: ?Recent Labs  ?  11/11/21 ?1526  ?TSH 1.789  ? ?Anemia Panel: ?Recent Labs  ?  11/11/21 ?1629 11/11/21 ?1636 11/12/21 ?2229  ?VITAMINB12 612  --   --   ?FOLATE  --  11.2  --   ?FERRITIN 423*  --   --   ?TIBC  --   --  286  ?IRON  --   --  125  ? ? Echo EF 55-60 ?RSVP 70 ?LAE severe  RAE mod ?Ao mean grad 27 ?E/E' 27 ? ? ?ASSESSMENT AND PLAN: ?Atypical chest pain likely musculoskeletal ?Atrial fibrillation permanent  ?Lower extremity cellulitis ?Volume overload/HFpEF ?Aortic stenosis-moderate ?Macrocytic anemia ?Gait instability ?Alcohol intake exuberant ?Lower extremity cellulitis  ?Pulm hypertension  ? ?Macrocytic anemia assoc with normal B12 and folate ?Needs abx for LE cellulitis will begin  cephalexin-- "intolerance"  with augmentin-N/V  ?Myoview today pending  ?Echo Pulm HTN with elevated E/E'  will keep in house until tomorrow and diurese  may consider RHC now or later ?  ?   ?  ? ? ? ?Signed, ?Virl Axe MD ? ?11/12/2021 ? ?

## 2021-11-12 NOTE — Progress Notes (Signed)
Echocardiogram ?2D Echocardiogram has been performed. ? ?Arlyss Gandy ?11/12/2021, 9:16 AM ?

## 2021-11-12 NOTE — Evaluation (Signed)
Physical Therapy Evaluation and Discharge ?Patient Details ?Name: Leonard Cisneros ?MRN: [id number] ?DOB: 1940/11/25 ?Today's Date: 11/12/2021 ? ?History of Present Illness ? Pt is a 81 y.o. M who presents with CP and LE edema. Stress imaging is pending. Significant PMH:  atrial fibrillation, CAD s/p CABG, s/p BMS to SVG-ramus intermediate on 01/2016, HTN, ETOH abuse.  ?Clinical Impression ? PTA, pt lives with his spouse, uses two canes vs crutches and R AFO intermittently for ambulation (in setting of right foot drop), and is independent with ADL's/IADL's. Pt overall appears fairly close to his functional baseline; does endorse mildly decreased endurance and slower speed. Pt ambulating 200 feet with bilateral canes at a supervision level. SpO2 97% on RA, HR 66-105 bpm. Discussed activity recommendations. No further acute PT or follow up needs; thank you for this consult. ?   ? ?Recommendations for follow up therapy are one component of a multi-disciplinary discharge planning process, led by the attending physician.  Recommendations may be updated based on patient status, additional functional criteria and insurance authorization. ? ?Follow Up Recommendations No PT follow up ? ?  ?Assistance Recommended at Discharge PRN  ?Patient can return home with the following ? A little help with walking and/or transfers;A little help with bathing/dressing/bathroom ? ?  ?Equipment Recommendations None recommended by PT  ?Recommendations for Other Services ?    ?  ?Functional Status Assessment Patient has had a recent decline in their functional status and demonstrates the ability to make significant improvements in function in a reasonable and predictable amount of time.  ? ?  ?Precautions / Restrictions Precautions ?Precautions: Fall ?Restrictions ?Weight Bearing Restrictions: No  ? ?  ? ?Mobility ? Bed Mobility ?Overal bed mobility: Modified Independent ?  ?  ?  ?  ?  ?  ?  ?  ? ?Transfers ?Overall transfer level: Modified  independent ?Equipment used: None ?  ?  ?  ?  ?  ?  ?  ?  ?  ? ?Ambulation/Gait ?Ambulation/Gait assistance: Supervision ?Gait Distance (Feet): 200 Feet ?Assistive device:  (2 canes) ?Gait Pattern/deviations: Step-through pattern, Decreased stride length ?Gait velocity: decreased ?  ?  ?General Gait Details: Mild dynamic instabiltiy, tendency for downward gaze, supervision for safety ? ?Stairs ?  ?  ?  ?  ?  ? ?Wheelchair Mobility ?  ? ?Modified Rankin (Stroke Patients Only) ?  ? ?  ? ?Balance Overall balance assessment: Mild deficits observed, not formally tested ?  ?  ?  ?  ?  ?  ?  ?  ?  ?  ?  ?  ?  ?  ?  ?  ?  ?  ?   ? ? ? ?Pertinent Vitals/Pain Pain Assessment ?Pain Assessment: No/denies pain  ? ? ?Home Living Family/patient expects to be discharged to:: Private residence ?Living Arrangements: Spouse/significant other ?Available Help at Discharge: Family;Available PRN/intermittently ?Type of Home: House ?Home Access: Stairs to enter ?  ?Entrance Stairs-Number of Steps: 1 ?  ?Home Layout: One level ?Home Equipment: Cane - single Barista (2 wheels);Crutches ?   ?  ?Prior Function Prior Level of Function : Independent/Modified Independent ?  ?  ?  ?  ?  ?  ?Mobility Comments: uses 2 canes vs crutches, denies falls ?ADLs Comments: independent ADL's/IADL's, cooks meals ?  ? ? ?Hand Dominance  ?   ? ?  ?Extremity/Trunk Assessment  ? Upper Extremity Assessment ?Upper Extremity Assessment: Overall WFL for tasks assessed ?  ? ?Lower Extremity  Assessment ?Lower Extremity Assessment: Overall WFL for tasks assessed ?  ? ?   ?Communication  ? Communication: No difficulties  ?Cognition Arousal/Alertness: Awake/alert ?Behavior During Therapy: [facility] for tasks assessed/performed ?Overall Cognitive Status: Within Functional Limits for tasks assessed ?  ?  ?  ?  ?  ?  ?  ?  ?  ?  ?  ?  ?  ?  ?  ?  ?  ?  ?  ? ?  ?General Comments   ? ?  ?Exercises    ? ?Assessment/Plan  ?  ?PT Assessment Patient does not need any  further PT services  ?PT Problem List   ? ?   ?  ?PT Treatment Interventions     ? ?PT Goals (Current goals can be found in the Care Plan section)  ?Acute Rehab PT Goals ?Patient Stated Goal: return to walking with crutches, go home ?PT Goal Formulation: All assessment and education complete, DC therapy ? ?  ?Frequency   ?  ? ? ?Co-evaluation   ?  ?  ?  ?  ? ? ?  ?AM-PAC PT "6 Clicks" Mobility  ?Outcome Measure Help needed turning from your back to your side while in a flat bed without using bedrails?: None ?Help needed moving from lying on your back to sitting on the side of a flat bed without using bedrails?: None ?Help needed moving to and from a bed to a chair (including a wheelchair)?: None ?Help needed standing up from a chair using your arms (e.g., wheelchair or bedside chair)?: None ?Help needed to walk in hospital room?: A Little ?Help needed climbing 3-5 steps with a railing? : A Little ?6 Click Score: 22 ? ?  ?End of Session   ?Activity Tolerance: Patient tolerated treatment well ?Patient left: in bed;with call bell/phone within reach ?Nurse Communication: Mobility status ?PT Visit Diagnosis: Unsteadiness on feet (R26.81) ?  ? ?Time: 8811-0315 ?PT Time Calculation (min) (ACUTE ONLY): 17 min ? ? ?Charges:   PT Evaluation ?$PT Eval Moderate Complexity: 1 Mod ?  ?  ?   ? ? ?Wyona Almas, PT, DPT ?Acute Rehabilitation Services ?Pager [phone] ?Office [phone] ? ? ?Carloine Margo Aye ?11/12/2021, 4:27 PM ? ?

## 2021-11-12 NOTE — Progress Notes (Signed)
? ?Progress Note ? ?Patient Name: Leonard Cisneros ?Date of Encounter: 11/12/2021 ? ?[facility] Cardiologist: Rozann Lesches, MD  ? ?Subjective  ? ?Feels improved this morning. Breathing better. No recurrent chest pain. Hopeful to go home for his grandsons 18th birthday celebration this evening.  ? ?Inpatient Medications  ?  ?Scheduled Meds: ? atorvastatin  80 mg Oral Daily  ? cephALEXin  500 mg Oral Q8H  ? diltiazem  240 mg Oral Daily  ? folic acid  1 mg Oral Daily  ? furosemide  40 mg Intravenous BID  ? metoprolol succinate  100 mg Oral Daily  ? multivitamin with minerals  1 tablet Oral Daily  ? sodium chloride flush  3 mL Intravenous Q12H  ? sodium chloride flush  3 mL Intravenous Q12H  ? thiamine  100 mg Oral Daily  ? Or  ? thiamine  100 mg Intravenous Daily  ? Warfarin - Pharmacist Dosing Inpatient   Does not apply G6269  ? ?Continuous Infusions: ? sodium chloride    ? ?PRN Meds: ?sodium chloride, acetaminophen **OR** acetaminophen, bisacodyl, HYDROcodone-acetaminophen, LORazepam **OR** LORazepam, melatonin, nitroGLYCERIN, ondansetron **OR** ondansetron (ZOFRAN) IV, polyethylene glycol, sodium chloride flush, traZODone  ? ?Vital Signs  ?  ?Vitals:  ? 11/11/21 2028 11/12/21 0002 11/12/21 0548 11/12/21 0928  ?BP: 128/60 (!) 117/53 (!) 117/59 133/66  ?Pulse: (!) 56 (!) 56 77 82  ?Resp:  (!) 22 20 (!) 24  ?Temp: 98.8 ?F (37.1 ?C) 97.9 ?F (36.6 ?C) 97.9 ?F (36.6 ?C)   ?TempSrc: Oral Oral Oral   ?SpO2: 95% 90% 92% 92%  ?Weight:   100.2 kg   ?Height:      ? ? ?Intake/Output Summary (Last 24 hours) at 11/12/2021 1113 ?Last data filed at 11/12/2021 0919 ?Gross per 24 hour  ?Intake --  ?Output 3450 ml  ?Net -3450 ml  ? ?Last 3 Weights 11/12/2021 11/11/2021 10/03/2021  ?Weight (lbs) 220 lb 14.4 oz 230 lb 223 lb  ?Weight (kg) 100.2 kg 104.327 kg 101.152 kg  ?   ? ?Telemetry  ?  ?Unable to assess, seen in nuc med - Personally Reviewed ? ?ECG  ?  ?Atrial fibrillation with rate 73 bpm, no STE/D - Personally  Reviewed ? ?Physical Exam  ? ?GEN: No acute distress.   ?Neck: No JVD ?Cardiac: IRIR, +murmur, no rubs or gallops.  ?Respiratory: Clear to auscultation bilaterally. ?GI: Soft, nontender, non-distended  ?MS: trace LE edema; No deformity. ?Neuro:  Nonfocal  ?Psych: Normal affect  ? ?Labs  ?  ?High Sensitivity Troponin:   ?Recent Labs  ?Lab 11/11/21 ?0825 11/11/21 ?1034  ?TROPONINIHS 12 12  ?   ?Chemistry ?Recent Labs  ?Lab 11/11/21 ?0825 11/12/21 ?4854  ?NA 131* 135  ?K 3.5 3.9  ?CL 98 102  ?CO2 24 27  ?GLUCOSE 115* 95  ?BUN 11 9  ?CREATININE 0.66 0.84  ?CALCIUM 8.6* 8.6*  ?PROT 7.1  --   ?ALBUMIN 3.9  --   ?AST 30  --   ?ALT 31  --   ?ALKPHOS 209*  --   ?BILITOT 1.3*  --   ?GFRNONAA >60 >60  ?ANIONGAP 9 6  ?  ?Lipids  ?Recent Labs  ?Lab 11/12/21 ?6270  ?CHOL 110  ?TRIG 23  ?HDL 74  ?[place] 31  ?CHOLHDL 1.5  ?  ?Hematology ?Recent Labs  ?Lab 11/11/21 ?0825 11/12/21 ?3500  ?WBC 6.8 7.4  ?RBC 3.00* 2.82*  ?HGB 10.8* 10.3*  ?HCT 32.4* 30.3*  ?MCV 108.0* 107.4*  ?MCH 36.0* 36.5*  ?  MCHC 33.3 34.0  ?RDW 16.1* 16.0*  ?PLT 209 189  ? ?Thyroid  ?Recent Labs  ?Lab 11/11/21 ?1526  ?TSH 1.789  ?  ?BNP ?Recent Labs  ?Lab 11/11/21 ?0825  ?BNP 152.0*  ?  ?DDimer No results for input(s): DDIMER in the last 168 hours.  ? ?Radiology  ?  ?DG Chest Port 1 View ? ?Result Date: 11/11/2021 ?CLINICAL DATA:  81 year old male with history of chest pain with exertion. EXAM: PORTABLE CHEST 1 VIEW COMPARISON:  Chest x-ray 04/07/2020. FINDINGS: There is cephalization of the pulmonary vasculature and slight indistinctness of the interstitial markings suggestive of mild pulmonary edema. No definite pleural effusions. Mild cardiomegaly. Upper mediastinal contours are within normal limits. Atherosclerotic calcifications in the thoracic aorta. Status post median sternotomy for CABG. IMPRESSION: 1. The appearance the chest suggest congestive heart failure, as above. 2. Aortic atherosclerosis. Electronically Signed   By: Vinnie Langton M.D.   On:  11/11/2021 09:32   ? ?Cardiac Studies  ? ?Echo and nuclear stress test pending ? ?Patient Profile  ?   ?81 y.o. male with a PMH of CAD s/p CABG in 2003 with subsequent BMS to SVG-RI in 2017, ICM, HTN, HLD, permanent atrial fibrillation on coumadin, and moderate aortic stenosis, who presented to [facility] with chest pain, transferred to [facility] for further eval.  ? ?Assessment & Plan  ?  ?1. Atypical chest pain in patient with CAD s/p CABG and subsequent BMS to SVG-RI: patient presented with chest pain. Reproducible on exam suggesting likely MSK etiology. Trops negative x2. EKG non-ischemic. Has not had any more chest pain today. Wonders if he pulled a muscle as his pain felt different than previous angina. Echo is pending. Patient is seen in [place] today ?- Await echocardiogram results to evaluate LV function ?- Await NST results to evaluate for ischemia ?- Not on aspirin given need for anticoagulation ?- Continue atorvastatin ?- Continue Bblocker  ? ?2. History of ICM with c/f diastolic CHF: patient appeared to have mild volume overload on exam yesterday with noted crackles and 1+ LE edema. Exam improved today. He reports improvement in breathing.  ?- Await echo results ?- Can likely resume home lasix at discharge ?- Continue metoprolol ? ?3. Permanent atrial fibrillation: rate appears controlled.  ?- Continue coumadin per pharmacy ?- Continue diltiazem and metoprolol succinate for rate control ? ?4. ETOH abuse: no evidence of withdrawal this morning ?- Continue CIWA per primary team ? ? ?   ? ?For questions or updates, please contact [place] ?Please consult [email or web address] for contact info under  ? ?  ?   ?Signed, ?Abigail Butts, PA-C  ?11/12/2021, 11:13 AM   ? ?

## 2021-11-12 NOTE — Assessment & Plan Note (Signed)
Continue metop, diltiazem, lasix ?

## 2021-11-13 ENCOUNTER — Ambulatory Visit: Payer: Medicare HMO | Admitting: Nurse Practitioner

## 2021-11-13 ENCOUNTER — Other Ambulatory Visit (HOSPITAL_COMMUNITY): Payer: Self-pay

## 2021-11-13 DIAGNOSIS — R079 Chest pain, unspecified: Secondary | ICD-10-CM | POA: Diagnosis not present

## 2021-11-13 LAB — PROTIME-INR
INR: 1.6 — ABNORMAL HIGH (ref 0.8–1.2)
Prothrombin Time: 19.4 seconds — ABNORMAL HIGH (ref 11.4–15.2)

## 2021-11-13 LAB — CBC WITH DIFFERENTIAL/PLATELET
Abs Immature Granulocytes: 0.04 10*3/uL (ref 0.00–0.07)
Basophils Absolute: 0.1 10*3/uL (ref 0.0–0.1)
Basophils Relative: 1 %
Eosinophils Absolute: 0.6 10*3/uL — ABNORMAL HIGH (ref 0.0–0.5)
Eosinophils Relative: 8 %
HCT: 31.6 % — ABNORMAL LOW (ref 39.0–52.0)
Hemoglobin: 10.6 g/dL — ABNORMAL LOW (ref 13.0–17.0)
Immature Granulocytes: 1 %
Lymphocytes Relative: 19 %
Lymphs Abs: 1.4 10*3/uL (ref 0.7–4.0)
MCH: 35.9 pg — ABNORMAL HIGH (ref 26.0–34.0)
MCHC: 33.5 g/dL (ref 30.0–36.0)
MCV: 107.1 fL — ABNORMAL HIGH (ref 80.0–100.0)
Monocytes Absolute: 0.8 10*3/uL (ref 0.1–1.0)
Monocytes Relative: 11 %
Neutro Abs: 4.5 10*3/uL (ref 1.7–7.7)
Neutrophils Relative %: 60 %
Platelets: 207 10*3/uL (ref 150–400)
RBC: 2.95 MIL/uL — ABNORMAL LOW (ref 4.22–5.81)
RDW: 15.9 % — ABNORMAL HIGH (ref 11.5–15.5)
WBC: 7.3 10*3/uL (ref 4.0–10.5)
nRBC: 0 % (ref 0.0–0.2)

## 2021-11-13 LAB — COMPREHENSIVE METABOLIC PANEL
ALT: 26 U/L (ref 0–44)
AST: 25 U/L (ref 15–41)
Albumin: 3.6 g/dL (ref 3.5–5.0)
Alkaline Phosphatase: 191 U/L — ABNORMAL HIGH (ref 38–126)
Anion gap: 9 (ref 5–15)
BUN: 11 mg/dL (ref 8–23)
CO2: 28 mmol/L (ref 22–32)
Calcium: 8.7 mg/dL — ABNORMAL LOW (ref 8.9–10.3)
Chloride: 101 mmol/L (ref 98–111)
Creatinine, Ser: 0.82 mg/dL (ref 0.61–1.24)
GFR, Estimated: >60 mL/min (ref >60)
Glucose, Bld: 103 mg/dL — ABNORMAL HIGH (ref 70–99)
Potassium: 3.3 mmol/L — ABNORMAL LOW (ref 3.5–5.1)
Sodium: 138 mmol/L (ref 135–145)
Total Bilirubin: 1.9 mg/dL — ABNORMAL HIGH (ref 0.3–1.2)
Total Protein: 6.5 g/dL (ref 6.5–8.1)

## 2021-11-13 LAB — IRON AND TIBC
Iron: 84 ug/dL (ref 45–182)
Saturation Ratios: 28 % (ref 17.9–39.5)
TIBC: 304 ug/dL (ref 250–450)
UIBC: 220 ug/dL

## 2021-11-13 LAB — VITAMIN B12: Vitamin B-12: 540 pg/mL (ref 180–914)

## 2021-11-13 LAB — FOLATE: Folate: 13.9 ng/mL (ref >5.9)

## 2021-11-13 LAB — MAGNESIUM: Magnesium: 2 mg/dL (ref 1.7–2.4)

## 2021-11-13 LAB — HEMOGLOBIN A1C
Hgb A1c MFr Bld: 5.4 % (ref 4.8–5.6)
Mean Plasma Glucose: 108 mg/dL

## 2021-11-13 LAB — PHOSPHORUS: Phosphorus: 3.4 mg/dL (ref 2.5–4.6)

## 2021-11-13 LAB — FERRITIN: Ferritin: 378 ng/mL — ABNORMAL HIGH (ref 24–336)

## 2021-11-13 MED ORDER — CEFADROXIL 500 MG PO CAPS: 500.0000 mg | ORAL_CAPSULE | Freq: Two times a day (BID) | ORAL | 0 refills | Status: AC

## 2021-11-13 MED ORDER — APIXABAN 5 MG PO TABS: 5.0000 mg | ORAL_TABLET | Freq: Two times a day (BID) | ORAL | 0 refills | Status: DC

## 2021-11-13 MED ORDER — POTASSIUM CHLORIDE CRYS ER 20 MEQ PO TBCR
40.0000 meq | EXTENDED_RELEASE_TABLET | ORAL | Status: AC
  Administered 2021-11-13 (×2): 40 meq via ORAL
  Filled 2021-11-13 (×2): qty 2

## 2021-11-13 MED ORDER — WARFARIN SODIUM 5 MG PO TABS: 10.0000 mg | ORAL_TABLET | Freq: Once | ORAL | Status: DC

## 2021-11-13 NOTE — Plan of Care (Signed)
?  Problem: Education: ?Goal: Knowledge of General Education information will improve ?Description: Including pain rating scale, medication(s)/side effects and non-pharmacologic comfort measures ?Outcome: Adequate for Discharge ?  ?Problem: Health Behavior/Discharge Planning: ?Goal: Ability to manage health-related needs will improve ?Outcome: Adequate for Discharge ?  ?Problem: Clinical Measurements: ?Goal: Ability to maintain clinical measurements within normal limits will improve ?Outcome: Adequate for Discharge ?Goal: Will remain free from infection ?Outcome: Adequate for Discharge ?  ?Problem: Coping: ?Goal: Level of anxiety will decrease ?Outcome: Adequate for Discharge ?  ?Problem: Elimination: ?Goal: Will not experience complications related to bowel motility ?Outcome: Adequate for Discharge ?Goal: Will not experience complications related to urinary retention ?Outcome: Adequate for Discharge ?  ?Problem: Safety: ?Goal: Ability to remain free from injury will improve ?Outcome: Adequate for Discharge ?  ?Problem: Education: ?Goal: Ability to demonstrate management of disease process will improve ?Outcome: Adequate for Discharge ?Goal: Ability to verbalize understanding of medication therapies will improve ?Outcome: Adequate for Discharge ?Goal: Individualized Educational Video(s) ?Outcome: Adequate for Discharge ?  ?Problem: Cardiac: ?Goal: Ability to achieve and maintain adequate cardiopulmonary perfusion will improve ?Outcome: Adequate for Discharge ?  ?

## 2021-11-13 NOTE — Assessment & Plan Note (Addendum)
Follow with cardiology, consider RHC in future per cards ?

## 2021-11-13 NOTE — Discharge Summary (Signed)
Physician Discharge Summary  Leonard Cisneros QZR:[id number] DOB: 1940/09/19 DOA: 11/11/2021  PCP: Erven Colla, DO  Admit date: 11/11/2021 Discharge date: 11/13/2021  Time spent: 40 minutes  Recommendations for Outpatient Follow-up:  Follow outpatient CBC/CMP  Transitioned to eliquis from warfarin at discharge, follow outpatient Severe pulm hypertension, follow with cards to consider right heart cath Follow volume status outpatient  Encourage etoh cessation   Discharge Diagnoses:  Principal Problem:   Chest pain Active Problems:   Hx of CABG   Acute on chronic diastolic CHF (congestive heart failure) (HCC)   Severe pulmonary hypertension (HCC)   Chronic atrial fibrillation (HCC)   Cellulitis   Essential hypertension   PAD (peripheral artery disease) ([place])   Long term (current) use of anticoagulants   Macrocytic anemia   ETOH abuse   Ambulatory dysfunction   Moderate aortic stenosis   Heart failure ([place])   Discharge Condition: stable  Diet recommendation: heart healthy  Filed Weights   11/11/21 0826 11/12/21 0548 11/13/21 0600  Weight: 104.3 kg 100.2 kg 99.6 kg    History of present illness:  81 yo with hx atrial fibrillation, CAD s/p CABG, s/p BMS to SVG-ramus intermediate on 01/2016, HTN, etoh abuse and multiple other medical problems who presented with CP and LE edema.  He was seen by cardiology and had stress test which was normal.  He improved with IV lasix.  Plan is for discharge with outpatient follow up.  See below for additional details  Hospital Course:  Assessment and Plan: * Chest pain Hx CABG 2003, 01/2016 s/p BMS to SVG - ramus intermediate, patent LIMA-mLAD, patent RIMA-RCA Echo with EF 55-60%, no RWMA, severely elevated PASP, IVC dilated with <50% resp variability Appreciate cardiology c/s -  No plans for further inpatient workup, normal myovue, follow outpatient with cardiology  Severe pulmonary hypertension ([place]) Follow with cardiology,  consider RHC in future per cards  Acute on chronic diastolic CHF (congestive heart failure) ([place]) CXR concerning for CHF Improved with IV lasix, discharge with Po lasix Echo with normal EF, severely elevated PASP, IVC dilated with <50% resp variability Strict I/O, daily weights Per cards, may consider RHC due to pulm HTN - follow outpatient to discuss further  Cellulitis Started on abx per cards for LE cellulitis  Chronic atrial fibrillation ([place]) Per cards, plan to transition to NOAC from warfarin -> will change to eliquis at discharge Continue metoprolol and diltiazem  Essential hypertension Continue metop, diltiazem, lasix  Macrocytic anemia B12, folate wnl.  Iron studies c/w AOCD. Related to etoh   ETOH abuse CIWA  Ambulatory dysfunction Walks with cane at baseline   Procedures:  Echo IMPRESSIONS     1. Left ventricular ejection fraction, by estimation, is 55 to 60%. The  left ventricle has normal function. The left ventricle has no regional  wall motion abnormalities. There is mild left ventricular hypertrophy.  Diastolic function indeterminant due to   severe MAC. Elevated left atrial pressure.   2. Right ventricular systolic function is normal. The right ventricular  size is mildly enlarged. There is severely elevated pulmonary artery  systolic pressure. The estimated right ventricular systolic pressure is  35.4 mmHg.   3. Left atrial size was severely dilated.   4. Right atrial size was moderately dilated.   5. The mitral valve is abnormal. Mild mitral valve regurgitation. Severe  mitral annular calcification.   6. The aortic valve has an indeterminant number of cusps. There is  moderate calcification of the aortic valve.  There is severe thickening of  the aortic valve. Aortic valve regurgitation is not visualized. Moderate  aortic valve stenosis. Aortic valve  mean gradient measures 27.0 mmHg. Aortic valve Vmax measures 3.42 m/s. AVA  by continuity  1.44cm2, DI 0.31   7. Aortic dilatation noted. There is mild dilatation of the ascending  aorta, measuring 41 mm.   8. The inferior vena cava is dilated in size with <50% respiratory  variability, suggesting right atrial pressure of 15 mmHg.   Comparison(s): Compared to prior TTE 03/2021, there continues to be  moderate AS (previous mean graident 91mHg; now 257mg). There is now  severe pulmonary HTN (previously not reported).   Stress test IMPRESSION: 1. No reversible ischemia or infarction.   2. Normal left ventricular wall motion.   3. Left ventricular ejection fraction 62%   4. Non invasive risk stratification*: Low   *2012 Appropriate Use Criteria for Coronary Revascularization Focused Update: J Am Coll Cardiol. 203267;12(4):580-998http://[email or web address]?[email or web address]   Consultations: cardiology  Discharge Exam: Vitals:   11/13/21 0932 11/13/21 0935  BP: (!) 110/57   Pulse:  79  Resp:    Temp:    SpO2:     No complaints Eager to dsicharge home  General: No acute distress. Cardiovascular: Heart sounds show Della Scrivener regular rate, and rhythm.  Lungs: Clear to auscultation bilaterally  Abdomen: Soft, nontender, nondistended Neurological: Alert and oriented 3. Moves all extremities 4 . Cranial nerves II through XII grossly intact. Skin: Warm and dry. No rashes or lesions. Extremities: No clubbing or cyanosis. No edema.  Discharge Instructions   Discharge Instructions     Diet - low sodium heart healthy   Complete by: As directed    Discharge instructions   Complete by: As directed    You were seen for chest pain.  You were seen for cardiology and had Judy Pollman stress test which was normal.  We diuresed you and your swelling improved.  Continue your lasix daily.  Check your weights daily.  If your weights increase by 3 lbs in Aloni Chuang day or 5 lbs in Marely Apgar week take an extra dose of lasix and call your cardiologist or PCP regarding further  instructions.  We're going to discharge you with eliquis for your atrial fibrillation.  Stop your warfarin.   We started you on antibiotics for concern for cellulitis.  Return for new, recurrent, or worsening symptoms.  Please ask your PCP to request records from this hospitalization so they know what was done and what the next steps will be.   Increase activity slowly   Complete by: As directed       Allergies as of 11/13/2021       Reactions   Augmentin [amoxicillin-pot Clavulanate] Nausea And Vomiting, Other (See Comments)   Has patient had Isabel Ardila PCN reaction causing immediate rash, facial/tongue/throat swelling, SOB or lightheadedness with hypotension: Unknown Has patient had Joyelle Siedlecki PCN reaction causing severe rash involving mucus membranes or skin necrosis: No Has patient had Mahalia Dykes PCN reaction that required hospitalization: No Has patient had Annis Lagoy PCN reaction occurring within the last 10 years: No If all of the above answers are "NO", then may proceed with Cephalosporin use.        Medication List     STOP taking these medications    aspirin EC 81 MG tablet   warfarin 7.5 MG tablet Commonly known as: COUMADIN       TAKE these medications    apixaban 5 MG Tabs tablet Commonly known as: ELIQUIS  Take 1 tablet (5 mg total) by mouth 2 (two) times daily.   atorvastatin 80 MG tablet Commonly known as: LIPITOR TAKE 1 TABLET BY MOUTH EVERY DAY   cefadroxil 500 MG capsule Commonly known as: DURICEF Take 1 capsule (500 mg total) by mouth 2 (two) times daily for 4 days.   chlorhexidine 0.12 % solution Commonly known as: Peridex Use as directed 15 mLs in the mouth or throat 2 (two) times daily.   diltiazem 240 MG 24 hr capsule Commonly known as: Dilt-XR TAKE 1 CAPSULE BY MOUTH EVERY DAY   furosemide 40 MG tablet Commonly known as: LASIX TAKE ONE DAILY. MAY TAKE AN EXTRA TABLET AS NEEDED FOR EDEMA.   HYDROcodone-acetaminophen 5-325 MG tablet Commonly known as: Norco Take 1  tablet by mouth 3 (three) times daily as needed for moderate pain.   lidocaine 2 % solution Commonly known as: XYLOCAINE Use as directed 10 mLs in the mouth or throat every 3 (three) hours as needed for mouth pain.   metoprolol succinate 100 MG 24 hr tablet Commonly known as: TOPROL-XL TAKE 1 TABLET BY MOUTH DAILY. TAKE WITH OR IMMEDIATELY FOLLOWING Yehonatan Grandison MEAL.   naloxone 4 MG/0.1ML Liqd nasal spray kit Commonly known as: NARCAN naloxone 4 mg/actuation nasal spray  TAKE BY NASAL ROUTE EVERY 3 MINUTES UNTIL PATIENT AWAKES OR EMS ARRIVES.   nitroGLYCERIN 0.4 MG SL tablet Commonly known as: NITROSTAT Place 1 tablet (0.4 mg total) under the tongue every 5 (five) minutes x 3 doses as needed for chest pain (if no relief after 3rd dose, proceed to the ED for an evaluation or call 911).       Allergies  Allergen Reactions   Augmentin [Amoxicillin-Pot Clavulanate] Nausea And Vomiting and Other (See Comments)    Has patient had Tomara Youngberg PCN reaction causing immediate rash, facial/tongue/throat swelling, SOB or lightheadedness with hypotension: Unknown Has patient had Ja Pistole PCN reaction causing severe rash involving mucus membranes or skin necrosis: No Has patient had Jasneet Schobert PCN reaction that required hospitalization: No Has patient had Berkley Wrightsman PCN reaction occurring within the last 10 years: No If all of the above answers are "NO", then may proceed with Cephalosporin use.       The results of significant diagnostics from this hospitalization (including imaging, microbiology, ancillary and laboratory) are listed below for reference.    Significant Diagnostic Studies: NM Myocar Multi W/Spect W/Wall Motion / EF  Result Date: 11/12/2021 CLINICAL DATA:  Chest pain. EXAM: MYOCARDIAL IMAGING WITH SPECT (REST AND PHARMACOLOGIC-STRESS) GATED LEFT VENTRICULAR WALL MOTION STUDY LEFT VENTRICULAR EJECTION FRACTION TECHNIQUE: Standard myocardial SPECT imaging was performed after resting intravenous injection of 10.4 mCi  Tc-79mtetrofosmin. Subsequently, intravenous infusion of Lexiscan was performed under the supervision of the Cardiology staff. At peak effect of the drug, 31.9 mCi Tc-967metrofosmin was injected intravenously and standard myocardial SPECT imaging was performed. Quantitative gated imaging was also performed to evaluate left ventricular wall motion, and estimate left ventricular ejection fraction. COMPARISON:  None. FINDINGS: Perfusion: No decreased activity in the left ventricle on stress imaging to suggest reversible ischemia or infarction. Wall Motion: Normal left ventricular wall motion. No left ventricular dilation. Left Ventricular Ejection Fraction: 62 % End diastolic volume 14101l End systolic volume 53 ml IMPRESSION: 1. No reversible ischemia or infarction. 2. Normal left ventricular wall motion. 3. Left ventricular ejection fraction 62% 4. Non invasive risk stratification*: Low *2012 Appropriate Use Criteria for Coronary Revascularization Focused Update: J Am Coll Cardiol. 207510;25(8):527-782http://[email or web address] Electronically Signed  By: Abigail Miyamoto M.D.   On: 11/12/2021 14:59   DG Chest Port 1 View  Result Date: 11/11/2021 CLINICAL DATA:  81 year old male with history of chest pain with exertion. EXAM: PORTABLE CHEST 1 VIEW COMPARISON:  Chest x-ray 04/07/2020. FINDINGS: There is cephalization of the pulmonary vasculature and slight indistinctness of the interstitial markings suggestive of mild pulmonary edema. No definite pleural effusions. Mild cardiomegaly. Upper mediastinal contours are within normal limits. Atherosclerotic calcifications in the thoracic aorta. Status post median sternotomy for CABG. IMPRESSION: 1. The appearance the chest suggest congestive heart failure, as above. 2. Aortic atherosclerosis. Electronically Signed   By: Vinnie Langton M.D.   On: 11/11/2021 09:32   ECHOCARDIOGRAM COMPLETE  Result Date: 11/12/2021    ECHOCARDIOGRAM  REPORT   Patient Name:   Leonard Cisneros Date of Exam: 11/12/2021 Medical Rec #:  [id number]        Height:       71.0 in Accession #:    [id number]       Weight:       220.9 lb Date of Birth:  11/27/1940       BSA:          2.200 m Patient Age:    31 years         BP:           117/59 mmHg Patient Gender: M                HR:           77 bpm. Exam Location:  Inpatient Procedure: 2D Echo Indications:    Chest pain  History:        Patient has prior history of Echocardiogram examinations, most                 recent 03/23/2021. Cardiomyopathy, CAD, Aortic Valve Disease,                 Arrythmias:Atrial Fibrillation and Atrial Flutter; Risk                 Factors:Hypertension and Dyslipidemia.  Sonographer:    Arlyss Gandy Referring Phys: [id number] COURAGE EMOKPAE  Sonographer Comments: Image acquisition challenging due to patient body habitus. IMPRESSIONS  1. Left ventricular ejection fraction, by estimation, is 55 to 60%. The left ventricle has normal function. The left ventricle has no regional wall motion abnormalities. There is mild left ventricular hypertrophy. Diastolic function indeterminant due to  severe MAC. Elevated left atrial pressure.  2. Right ventricular systolic function is normal. The right ventricular size is mildly enlarged. There is severely elevated pulmonary artery systolic pressure. The estimated right ventricular systolic pressure is 02.5 mmHg.  3. Left atrial size was severely dilated.  4. Right atrial size was moderately dilated.  5. The mitral valve is abnormal. Mild mitral valve regurgitation. Severe mitral annular calcification.  6. The aortic valve has an indeterminant number of cusps. There is moderate calcification of the aortic valve. There is severe thickening of the aortic valve. Aortic valve regurgitation is not visualized. Moderate aortic valve stenosis. Aortic valve mean gradient measures 27.0 mmHg. Aortic valve Vmax measures 3.42 m/s. AVA by continuity 1.44cm2, DI 0.31  7.  Aortic dilatation noted. There is mild dilatation of the ascending aorta, measuring 41 mm.  8. The inferior vena cava is dilated in size with <50% respiratory variability, suggesting right atrial pressure of 15 mmHg. Comparison(s): Compared to prior TTE 03/2021, there continues to be moderate AS (  previous mean graident 17mHg; now 227mg). There is now severe pulmonary HTN (previously not reported). FINDINGS  Left Ventricle: Left ventricular ejection fraction, by estimation, is 55 to 60%. The left ventricle has normal function. The left ventricle has no regional wall motion abnormalities. The left ventricular internal cavity size was normal in size. There is  mild left ventricular hypertrophy. Diastolic function indeterminant due to severe MAC. Elevated left atrial pressure. Right Ventricle: The right ventricular size is mildly enlarged. No increase in right ventricular wall thickness. Right ventricular systolic function is normal. There is severely elevated pulmonary artery systolic pressure. The tricuspid regurgitant velocity is 3.70 m/s, and with an assumed right atrial pressure of 15 mmHg, the estimated right ventricular systolic pressure is 6978.4mHg. Left Atrium: Left atrial size was severely dilated. Right Atrium: Right atrial size was moderately dilated. Pericardium: There is no evidence of pericardial effusion. Mitral Valve: The mitral valve is abnormal. There is mild thickening of the mitral valve leaflet(s). There is moderate calcification of the mitral valve leaflet(s). Severe mitral annular calcification. Mild mitral valve regurgitation. MV peak gradient, 15.2 mmHg. The mean mitral valve gradient is 4.0 mmHg. Tricuspid Valve: The tricuspid valve is normal in structure. Tricuspid valve regurgitation is trivial. Aortic Valve: The aortic valve has an indeterminant number of cusps. There is moderate calcification of the aortic valve. There is severe thickening of the aortic valve. Aortic valve regurgitation  is trivial. Moderate aortic stenosis is present. Aortic valve mean gradient measures 27.0 mmHg. Aortic valve peak gradient measures 46.8 mmHg. Aortic valve area, by VTI measures 1.56 cm. Pulmonic Valve: The pulmonic valve was not well visualized. Pulmonic valve regurgitation is trivial. Aorta: Aortic dilatation noted. There is mild dilatation of the ascending aorta, measuring 41 mm. Venous: The inferior vena cava is dilated in size with less than 50% respiratory variability, suggesting right atrial pressure of 15 mmHg. IAS/Shunts: The atrial septum is grossly normal.  LEFT VENTRICLE PLAX 2D LVIDd:         4.40 cm   Diastology LVIDs:         3.10 cm   LV e' medial:    7.07 cm/s LV PW:         1.50 cm   LV E/e' medial:  26.6 LV IVS:        1.50 cm   LV e' lateral:   8.16 cm/s LVOT diam:     2.40 cm   LV E/e' lateral: 23.0 LV SV:         109 LV SV Index:   50 LVOT Area:     4.52 cm  RIGHT VENTRICLE            IVC RV Basal diam:  4.40 cm    IVC diam: 2.80 cm RV Mid diam:    3.80 cm RV S prime:     9.90 cm/s TAPSE (M-mode): 1.6 cm LEFT ATRIUM              Index        RIGHT ATRIUM           Index LA Vol (A2C):   161.0 ml 73.20 ml/m  RA Area:     22.70 cm LA Vol (A4C):   153.0 ml 69.56 ml/m  RA Volume:   60.60 ml  27.55 ml/m LA Biplane Vol: 164.0 ml 74.56 ml/m  AORTIC VALVE AV Area (Vmax):    1.41 cm AV Area (Vmean):   1.51 cm AV Area (VTI):  1.56 cm AV Vmax:           342.00 cm/s AV Vmean:          220.500 cm/s AV VTI:            0.700 m AV Peak Grad:      46.8 mmHg AV Mean Grad:      27.0 mmHg LVOT Vmax:         106.50 cm/s LVOT Vmean:        73.750 cm/s LVOT VTI:          0.242 m LVOT/AV VTI ratio: 0.34  AORTA Ao Root diam: 3.60 cm Ao Asc diam:  4.10 cm MITRAL VALVE                TRICUSPID VALVE MV Area (PHT): 5.31 cm     TR Peak grad:   54.8 mmHg MV Area VTI:   2.75 cm     TR Vmax:        370.00 cm/s MV Peak grad:  15.2 mmHg MV Mean grad:  4.0 mmHg     SHUNTS MV Vmax:       1.95 m/s     Systemic VTI:   0.24 m MV Vmean:      88.9 cm/s    Systemic Diam: 2.40 cm MV Decel Time: 143 msec MV E velocity: 188.00 cm/s MV Chanah Tidmore velocity: 51.40 cm/s MV E/Lalani Winkles ratio:  3.66 Gwyndolyn Kaufman MD Electronically signed by Gwyndolyn Kaufman MD Signature Date/Time: 11/12/2021/12:22:43 PM    Final     Microbiology: Recent Results (from the past 240 hour(s))  Resp Panel by RT-PCR (Flu Ching Rabideau&B, Covid) Nasopharyngeal Swab     Status: None   Collection Time: 11/11/21  8:51 AM   Specimen: Nasopharyngeal Swab; Nasopharyngeal(NP) swabs in vial transport medium  Result Value Ref Range Status   SARS Coronavirus 2 by RT PCR NEGATIVE NEGATIVE Final    Comment: (NOTE) SARS-CoV-2 target nucleic acids are NOT DETECTED.  The SARS-CoV-2 RNA is generally detectable in upper respiratory specimens during the acute phase of infection. The lowest concentration of SARS-CoV-2 viral copies this assay can detect is 138 copies/mL. Margaret Staggs negative result does not preclude SARS-Cov-2 infection and should not be used as the sole basis for treatment or other patient management decisions. Aubrianne Molyneux negative result may occur with  improper specimen collection/handling, submission of specimen other than nasopharyngeal swab, presence of viral mutation(s) within the areas targeted by this assay, and inadequate number of viral copies(<138 copies/mL). Rayan Ines negative result must be combined with clinical observations, patient history, and epidemiological information. The expected result is Negative.  Fact Sheet for Patients:  EntrepreneurPulse.com.au  Fact Sheet for Healthcare Providers:  IncredibleEmployment.be  This test is no t yet approved or cleared by the [place] FDA and  has been authorized for detection and/or diagnosis of SARS-CoV-2 by FDA under an Emergency Use Authorization (EUA). This EUA will remain  in effect (meaning this test can be used) for the duration of the COVID-19 declaration under Section 564(b)(1)  of the Act, 21 U.S.C.section 360bbb-3(b)(1), unless the authorization is terminated  or revoked sooner.       Influenza Rachella Basden by PCR NEGATIVE NEGATIVE Final   Influenza B by PCR NEGATIVE NEGATIVE Final    Comment: (NOTE) The Xpert Xpress SARS-CoV-2/FLU/RSV plus assay is intended as an aid in the diagnosis of influenza from Nasopharyngeal swab specimens and should not be used as Hilton Saephan sole basis for treatment. Nasal washings and aspirates are  unacceptable for Xpert Xpress SARS-CoV-2/FLU/RSV testing.  Fact Sheet for Patients: EntrepreneurPulse.com.au  Fact Sheet for Healthcare Providers: IncredibleEmployment.be  This test is not yet approved or cleared by the [place] FDA and has been authorized for detection and/or diagnosis of SARS-CoV-2 by FDA under an Emergency Use Authorization (EUA). This EUA will remain in effect (meaning this test can be used) for the duration of the COVID-19 declaration under Section 564(b)(1) of the Act, 21 U.S.C. section 360bbb-3(b)(1), unless the authorization is terminated or revoked.  Performed at [facility], [address], [place], [place] [address]      Labs: Basic Metabolic Panel: Recent Labs  Lab 11/11/21 0825 11/12/21 0042 11/13/21 0110  NA 131* 135 138  K 3.5 3.9 3.3*  CL 98 102 101  CO2 _0 GLUCOSE 115* 95 103*  BUN _1 CREATININE 0.66 0.84 0.82  CALCIUM 8.6* 8.6* 8.7*  MG  --   --  2.0  PHOS  --   --  3.4   Liver Function Tests: Recent Labs  Lab 11/11/21 0825 11/13/21 0110  AST 30 25  ALT 31 26  ALKPHOS 209* 191*  BILITOT 1.3* 1.9*  PROT 7.1 6.5  ALBUMIN 3.9 3.6   No results for input(s): LIPASE, AMYLASE in the last 168 hours. No results for input(s): AMMONIA in the last 168 hours. CBC: Recent Labs  Lab 11/11/21 0825 11/12/21 0042 11/13/21 0110  WBC 6.8 7.4 7.3  NEUTROABS  --   --  4.5  HGB 10.8* 10.3* 10.6*  HCT 32.4* 30.3* 31.6*  MCV 108.0* 107.4* 107.1*  PLT  209 189 207   Cardiac Enzymes: No results for input(s): CKTOTAL, CKMB, CKMBINDEX, TROPONINI in the last 168 hours. BNP: BNP (last 3 results) Recent Labs    11/11/21 0825  BNP 152.0*    ProBNP (last 3 results) No results for input(s): PROBNP in the last 8760 hours.  CBG: No results for input(s): GLUCAP in the last 168 hours.     Signed:  Fayrene Helper MD.  [facility] 11/13/2021, 4:03 PM

## 2021-11-13 NOTE — Progress Notes (Signed)
ANTICOAGULATION CONSULT NOTE ? ?Pharmacy Consult for warfarin ?Indication: atrial fibrillation ? ?Allergies  ?Allergen Reactions  ? Augmentin [Amoxicillin-Pot Clavulanate] Nausea And Vomiting and Other (See Comments)  ?  Has patient had a PCN reaction causing immediate rash, facial/tongue/throat swelling, SOB or lightheadedness with hypotension: Unknown ?Has patient had a PCN reaction causing severe rash involving mucus membranes or skin necrosis: No ?Has patient had a PCN reaction that required hospitalization: No ?Has patient had a PCN reaction occurring within the last 10 years: No ?If all of the above answers are "NO", then may proceed with Cephalosporin use. ?  ? ? ?Patient Measurements: ?Height: '5\' 11"'$  (180.3 cm) ?Weight: 99.6 kg (219 lb 9.3 oz) ?IBW/kg (Calculated) : 75.3 ? ?Vital Signs: ?Temp: 98.4 ?F (36.9 ?C) (03/13 0600) ?Temp Source: Oral (03/13 0600) ?BP: 110/57 (03/13 0932) ?Pulse Rate: 79 (03/13 0935) ? ?Labs: ?Recent Labs  ?  11/11/21 ?0825 11/11/21 ?1034 11/12/21 ?6195 11/12/21 ?1438 11/13/21 ?0110  ?HGB 10.8*  --  10.3*  --  10.6*  ?HCT 32.4*  --  30.3*  --  31.6*  ?PLT 209  --  189  --  207  ?LABPROT 23.6*  --   --  20.4* 19.4*  ?INR 2.1*  --   --  1.7* 1.6*  ?CREATININE 0.66  --  0.84  --  0.82  ?TROPONINIHS 12 12  --   --   --   ? ? ? ?Estimated Creatinine Clearance: 86.4 mL/min (by C-G formula based on SCr of 0.82 mg/dL). ? ? ?Medical History: ?Past Medical History:  ?Diagnosis Date  ? Aortic stenosis   ? Atrial flutter ([place])   ? a. s/p remote ablation by Dr. [name].  ? CAD (coronary artery disease)   ? a. s/p CABG 2003. b. 01/2016: unstable angina - s/p BMS to SVG-ramus intermediate, patent LIMA-mLAD, patent RIMA-dRCA.   ? Chronic lower back pain   ? Essential hypertension   ? Hyperlipidemia   ? Ischemic cardiomyopathy   ? a. Cath 01/2016 -  EF 50-55% with mild distal inferior hypocontractility.  ? Peripheral neuropathy 04/04/2014  ? Permanent atrial fibrillation ([place])   ? ?Assessment: ?Leonard Cisneros  on warfarin PTA for hx AF admitted with CP. Pharmacy consulted to dose warfarin.  ? ?Home warfarin dose: 11.'25mg'$  on Tuesdays, 7.5 mg all other days ? ?INR today 1.6 and below goal. No signs of bleeding noted.  ? ?Goal of Therapy:  ?INR 2-3 ?Monitor platelets by anticoagulation protocol: Yes ?  ?Plan:  ?Warfarin 10 mg PO x 1 again tonight. ?Daily INR, CBC ?Monitor for signs and symptoms of bleeding ? ?Thank you for involving pharmacy in this patient's care. ? ?[place] Crane, Pharm D, BCPS, BCCP ?Clinical Pharmacist ? 11/13/2021 2:23 PM  ? ?[facility] pharmacy phone numbers are listed on [email or web address] ? ?

## 2021-11-13 NOTE — TOC Benefit Eligibility Note (Signed)
Patient Advocate Encounter ? ?Insurance verification completed.   ? ?The patient is currently admitted and upon discharge could be taking Eliquis 5 mg. ? ?The current 30 day co-pay is, $47.00.  ? ?The patient is insured through [employer] Medicare Part D  ? ? ? ?Kayan Blissett, CPhT ?Pharmacy Patient Advocate Specialist ?[facility] Patient Advocate Team ?Direct Number: [phone]  Fax: [phone] ? ? ? ? ? ?  ?

## 2021-11-13 NOTE — Progress Notes (Signed)
? ? ? ? ?Primary Cardiologist:  Domenic Polite ? ?Patient Name: Leonard Cisneros ?  ?SUBJECTIVE:f No complaints  ? ?Past Medical History:  ?Diagnosis Date  ? Aortic stenosis   ? Atrial flutter ([place])   ? a. s/p remote ablation by Dr. [name].  ? CAD (coronary artery disease)   ? a. s/p CABG 2003. b. 01/2016: unstable angina - s/p BMS to SVG-ramus intermediate, patent LIMA-mLAD, patent RIMA-dRCA.   ? Chronic lower back pain   ? Essential hypertension   ? Hyperlipidemia   ? Ischemic cardiomyopathy   ? a. Cath 01/2016 -  EF 50-55% with mild distal inferior hypocontractility.  ? Peripheral neuropathy 04/04/2014  ? Permanent atrial fibrillation ([place])   ? ? ?Scheduled Meds: ? ?Scheduled Meds: ? atorvastatin  80 mg Oral Daily  ? cephALEXin  500 mg Oral Q8H  ? diltiazem  240 mg Oral Daily  ? folic acid  1 mg Oral Daily  ? furosemide  40 mg Intravenous BID  ? metoprolol succinate  100 mg Oral Daily  ? multivitamin with minerals  1 tablet Oral Daily  ? potassium chloride  40 mEq Oral Q4H  ? sodium chloride flush  3 mL Intravenous Q12H  ? sodium chloride flush  3 mL Intravenous Q12H  ? thiamine  100 mg Oral Daily  ? Or  ? thiamine  100 mg Intravenous Daily  ? Warfarin - Pharmacist Dosing Inpatient   Does not apply E2683  ? ?Continuous Infusions: ? sodium chloride    ? ?sodium chloride, acetaminophen **OR** acetaminophen, bisacodyl, HYDROcodone-acetaminophen, LORazepam **OR** LORazepam, nitroGLYCERIN, ondansetron **OR** ondansetron (ZOFRAN) IV, polyethylene glycol, sodium chloride flush, traZODone ? ? ? ?PHYSICAL EXAM ?Vitals:  ? 11/12/21 1346 11/12/21 2000 11/12/21 2001 11/13/21 0600  ?BP:  121/74 121/74 (!) 125/59  ?Pulse:    75  ?Resp:  (!) 27    ?Temp: 98 ?F (36.7 ?C)  98.7 ?F (37.1 ?C) 98.4 ?F (36.9 ?C)  ?TempSrc: Oral  Oral Oral  ?SpO2: 96%  95% 95%  ?Weight:    99.6 kg  ?Height:      ? ? ?No distress ?Rhonchi ?AS murmur  ?Abdomen benign  ?Plus one bilateral edema  ? ? ?TELEMETRY: afib 11/13/2021  ?  ? ? ?Intake/Output Summary  (Last 24 hours) at 11/13/2021 4196 ?Last data filed at 11/13/2021 0152 ?Gross per 24 hour  ?Intake --  ?Output 3750 ml  ?Net -3750 ml  ? ? ?LABS: ?Basic Metabolic Panel: ?Recent Labs  ?Lab 11/11/21 ?0825 11/12/21 ?2229 11/13/21 ?0110  ?NA 131* 135 138  ?K 3.5 3.9 3.3*  ?CL 98 102 101  ?CO2 '24 27 28  '$ ?GLUCOSE 115* 95 103*  ?BUN '11 9 11  '$ ?CREATININE 0.66 0.84 0.82  ?CALCIUM 8.6* 8.6* 8.7*  ?MG  --   --  2.0  ?PHOS  --   --  3.4  ? ?Cardiac Enzymes: ?No results for input(s): CKTOTAL, CKMB, CKMBINDEX, TROPONINI in the last 72 hours. ?CBC: ?Recent Labs  ?Lab 11/11/21 ?0825 11/12/21 ?7989 11/13/21 ?0110  ?WBC 6.8 7.4 7.3  ?NEUTROABS  --   --  4.5  ?HGB 10.8* 10.3* 10.6*  ?HCT 32.4* 30.3* 31.6*  ?MCV 108.0* 107.4* 107.1*  ?PLT 209 189 207  ? ?PROTIME: ?Recent Labs  ?  11/11/21 ?0825 11/12/21 ?1438 11/13/21 ?0110  ?LABPROT 23.6* 20.4* 19.4*  ?INR 2.1* 1.7* 1.6*  ? ?Liver Function Tests: ?Recent Labs  ?  11/11/21 ?0825 11/13/21 ?0110  ?AST 30 25  ?ALT 31 26  ?  ALKPHOS 209* 191*  ?BILITOT 1.3* 1.9*  ?PROT 7.1 6.5  ?ALBUMIN 3.9 3.6  ? ?No results for input(s): LIPASE, AMYLASE in the last 72 hours. ?BNP: ?BNP (last 3 results) ?Recent Labs  ?  11/11/21 ?0825  ?BNP 152.0*  ? ?  ?Recent Labs  ?  11/12/21 ?0177  ?CHOL 110  ?HDL 74  ?[place] 31  ?TRIG 23  ?CHOLHDL 1.5  ? ?Thyroid Function Tests: ?Recent Labs  ?  11/11/21 ?1526  ?TSH 1.789  ? ?Anemia Panel: ?Recent Labs  ?  11/13/21 ?0110  ?VITAMINB12 540  ?FOLATE 13.9  ?FERRITIN 378*  ?TIBC 304  ?IRON 84  ? ? Echo EF 55-60 ?RSVP 70 ?LAE severe  RAE mod ?Ao mean grad 27 ?E/E' 27 ? ? ?ASSESSMENT AND PLAN: ? ?Chest pain :  R/O no acute ECG changes Normal myovue 11/12/21 no plans for further inpatient w/u  ?Diastolic CHF:  improved with lasix good diuresis  ?AFib:  permanent good rate control with lopressor on coumadin INR 1.6  ?AS:  moderate by TTE mean gradient 27 mmHg normal EF Not surgical S2 preserved on exam  ?Cellulitis :  per primary service on keflex ?ETOH:  exuberant  ? ?Ok to d/c  from cardiac perspective will arrange outpatient f/u with Dr Domenic Polite ? ? ?Signed, ?Jenkins Rouge MD ? ?11/13/2021 ? ?

## 2021-11-14 ENCOUNTER — Encounter: Payer: Self-pay | Admitting: Cardiology

## 2021-11-15 ENCOUNTER — Ambulatory Visit: Payer: Self-pay | Admitting: *Deleted

## 2021-11-16 ENCOUNTER — Encounter: Payer: Self-pay | Admitting: Cardiology

## 2021-11-16 ENCOUNTER — Ambulatory Visit: Payer: Medicare HMO | Admitting: Cardiology

## 2021-11-16 VITALS — BP 122/64 | HR 78 | Ht 71.0 in | Wt 228.2 lb

## 2021-11-16 DIAGNOSIS — I5032 Chronic diastolic (congestive) heart failure: Secondary | ICD-10-CM | POA: Diagnosis not present

## 2021-11-16 DIAGNOSIS — I4821 Permanent atrial fibrillation: Secondary | ICD-10-CM

## 2021-11-16 DIAGNOSIS — I350 Nonrheumatic aortic (valve) stenosis: Secondary | ICD-10-CM

## 2021-11-16 DIAGNOSIS — I25119 Atherosclerotic heart disease of native coronary artery with unspecified angina pectoris: Secondary | ICD-10-CM | POA: Diagnosis not present

## 2021-11-16 MED ORDER — TORSEMIDE 40 MG PO TABS: 40.0000 mg | ORAL_TABLET | Freq: Every day | ORAL | 3 refills | Status: DC

## 2021-11-16 NOTE — Patient Instructions (Addendum)
Medication Instructions:  ?Your physician has recommended you make the following change in your medication:  ?Stop lasix ?Start Demadex 40 mg once a day, may take additional tablet for fluid gain ?Continue all other medications as directed ? ?Labwork: ?BMET (may have done with your PCP within the next 2 weeks) ? ?Testing/Procedures: ?none ? ?Follow-Up: ?Your physician recommends that you schedule a follow-up appointment in: 6 weeks ? ?Any Other Special Instructions Will Be Listed Below (If Applicable). ? ?If you need a refill on your cardiac medications before your next appointment, please call your pharmacy. ? ?

## 2021-11-16 NOTE — Progress Notes (Signed)
? ? ?Cardiology Office Note ? ?Date: 11/16/2021  ? ?ID: Leonard Cisneros, DOB 1941-02-27, MRN [id number] ? ?PCP:  Renee Rival, FNP  ?Cardiologist:  Rozann Lesches, MD ?Electrophysiologist:  None  ? ?Chief Complaint  ?Patient presents with  ? Cardiac follow-up  ? ? ?History of Present Illness: ?Leonard Cisneros is an 81 y.o. male last seen in January.  He presents for a post hospital follow-up, recently admitted with chest discomfort but no evidence of ACS, also acute on chronic HFpEF. ? ?Follow-up cardiac structural and ischemic testing is noted below.  LVEF remains normal range at 55 to 60% with evidence of severe pulmonary hypertension, and moderate calcific aortic stenosis.  There were no significant ischemic territories by Myoview. ? ?He states that he feels much better today, leg swelling improved, sleeping more easily.  No orthopnea or PND.  His weight is starting to go back up however. ? ?We went over his medications and discussed switching from Lasix to Demadex. ? ?Past Medical History:  ?Diagnosis Date  ? Aortic stenosis   ? Atrial flutter ([place])   ? a. s/p remote ablation by Dr. [name].  ? CAD (coronary artery disease)   ? a. s/p CABG 2003. b. 01/2016: unstable angina - s/p BMS to SVG-ramus intermediate, patent LIMA-mLAD, patent RIMA-dRCA.   ? Chronic lower back pain   ? Essential hypertension   ? Hyperlipidemia   ? Ischemic cardiomyopathy   ? a. Cath 01/2016 -  EF 50-55% with mild distal inferior hypocontractility.  ? Peripheral neuropathy 04/04/2014  ? Permanent atrial fibrillation ([place])   ? ? ?Past Surgical History:  ?Procedure Laterality Date  ? BACK SURGERY    ? CARDIAC CATHETERIZATION  ~ 2000; 2003  ? CARDIAC CATHETERIZATION N/A 02/01/2016  ? Procedure: Left Heart Cath and Cors/Grafts Angiography;  Surgeon: [name], MD;  Location: [facility] [facility];  Service: Cardiovascular;  Laterality: N/A;  ? CARDIAC CATHETERIZATION N/A 02/01/2016  ? Procedure: Coronary Stent Intervention;  Surgeon:  [name], MD;  Location: [place] [facility];  Service: Cardiovascular;  Laterality: N/A;  ? CATARACT EXTRACTION W/PHACO Left 02/09/2019  ? Procedure: CATARACT EXTRACTION PHACO AND INTRAOCULAR LENS PLACEMENT (IOC);  Surgeon: [name], MD;  Location: [facility];  Service: Ophthalmology;  Laterality: Left;  CDE: 27.70  ? CATARACT EXTRACTION W/PHACO Right 02/23/2019  ? Procedure: CATARACT EXTRACTION PHACO AND INTRAOCULAR LENS PLACEMENT RIGHT EYE;  Surgeon: [name], MD;  Location: [facility];  Service: Ophthalmology;  Laterality: Right;  CDE: 10.15  ? COLONOSCOPY  06/04/2002  ? Normal colon and rectum  ? COLONOSCOPY  07/08/2012  ? Procedure: COLONOSCOPY;  Surgeon: [name], MD;  Location: [facility];  Service: Endoscopy;  Laterality: N/A;  11:30  ? COLONOSCOPY N/A 10/02/2017  ? Procedure: COLONOSCOPY;  Surgeon: [name], MD;  Location: [facility];  Service: Endoscopy;  Laterality: N/A;  10:30am  ? CORONARY ANGIOPLASTY WITH STENT PLACEMENT  02/01/2016  ? CORONARY ARTERY BYPASS GRAFT  2003  ? LIMA to LAD,LIMA to distal RCA,SVG to ramus intermediate vessel.  ? FEMORAL REVISION Right 03/30/2019  ? Procedure: Femoral revision right total hip arthroplasty;  Surgeon: [name], MD;  Location: [facility];  Service: Orthopedics;  Laterality: Right;  132mn  ? FOREIGN BODY REMOVAL Right 04/02/2014  ? Procedure: FOREIGN BODY REMOVAL RIGHT FOOT;  Surgeon: MJamesetta So MD;  Location: [facility];  Service: General;  Laterality: Right;  ? JOINT REPLACEMENT    ?  JOINT REPLACEMENT    ? LUMBAR DISC SURGERY    ? REVISION TOTAL HIP ARTHROPLASTY Bilateral 2005-2012  ? right-left  ? SHOULDER OPEN ROTATOR CUFF REPAIR Right   ? TOTAL HIP ARTHROPLASTY Right 1999  ? TOTAL HIP ARTHROPLASTY Left 2008  ? US ECHOCARDIOGRAPHY  11/13/2011  ? LV mildly dilated,mild concentric LVH,LA mod - severely dilated,RA mildly dilated,mild to mod. mitral annular ca+,mild to mod MR,aortic root ca+ w/mild dilatation,bicuspid AOV cannot be  excluded.  ? ? ?Current Outpatient Medications  ?Medication Sig Dispense Refill  ? apixaban (ELIQUIS) 5 MG TABS tablet Take 1 tablet (5 mg total) by mouth 2 (two) times daily. 60 tablet 0  ? atorvastatin (LIPITOR) 80 MG tablet TAKE 1 TABLET BY MOUTH EVERY DAY 90 tablet 1  ? cefadroxil (DURICEF) 500 MG capsule Take 1 capsule (500 mg total) by mouth 2 (two) times daily for 4 days. 8 capsule 0  ? chlorhexidine (PERIDEX) 0.12 % solution Use as directed 15 mLs in the mouth or throat 2 (two) times daily. 120 mL 0  ? diltiazem (DILT-XR) 240 MG 24 hr capsule TAKE 1 CAPSULE BY MOUTH EVERY DAY 90 capsule 3  ? furosemide (LASIX) 40 MG tablet TAKE ONE DAILY. MAY TAKE AN EXTRA TABLET AS NEEDED FOR EDEMA. 225 tablet 3  ? HYDROcodone-acetaminophen (NORCO) 5-325 MG tablet Take 1 tablet by mouth 3 (three) times daily as needed for moderate pain. 30 tablet 0  ? lidocaine (XYLOCAINE) 2 % solution Use as directed 10 mLs in the mouth or throat every 3 (three) hours as needed for mouth pain. 100 mL 0  ? metoprolol succinate (TOPROL-XL) 100 MG 24 hr tablet TAKE 1 TABLET BY MOUTH DAILY. TAKE WITH OR IMMEDIATELY FOLLOWING A MEAL. 90 tablet 1  ? naloxone (NARCAN) nasal spray 4 mg/0.1 mL naloxone 4 mg/actuation nasal spray ? TAKE BY NASAL ROUTE EVERY 3 MINUTES UNTIL PATIENT AWAKES OR EMS ARRIVES.    ? nitroGLYCERIN (NITROSTAT) 0.4 MG SL tablet Place 1 tablet (0.4 mg total) under the tongue every 5 (five) minutes x 3 doses as needed for chest pain (if no relief after 3rd dose, proceed to the ED for an evaluation or call 911). 25 tablet 3  ? torsemide 40 MG TABS Take 40 mg by mouth daily. May take additional 40 mg tablet as needed for fluid gain. 90 tablet 3  ? ?No current facility-administered medications for this visit.  ? ?Allergies:  Augmentin [amoxicillin-pot clavulanate]  ? ?ROS: No palpitations or syncope. ? ?Physical Exam: ?VS:  BP 122/64   Pulse 78   Ht _0  (1.803 m)   Wt 228 lb 3.2 oz (103.5 kg)   SpO2 96%   BMI 31.83 kg/m? ,  BMI Body mass index is 31.83 kg/m?. ? ?Wt Readings from Last 3 Encounters:  ?11/16/21 228 lb 3.2 oz (103.5 kg)  ?11/13/21 219 lb 9.3 oz (99.6 kg)  ?10/03/21 223 lb (101.2 kg)  ?  ?General: Patient appears comfortable at rest. ?HEENT: Conjunctiva and lids normal, wearing a mask. ?Neck: Supple, no elevated JVP or carotid bruits, no thyromegaly. ?Lungs: Clear to auscultation, nonlabored breathing at rest. ?Cardiac: Irregularly irregular, no S3, 3/6 systolic murmur, no pericardial rub. ?Extremities: Mild lower leg edema, brace on right lower leg. ? ?ECG:  An ECG dated 11/11/2021 was personally reviewed today and demonstrated:  Atrial fibrillation with rightward axis and nonspecific ST changes. ? ?Recent Labwork: ?11/11/2021: B Natriuretic Peptide 152.0; TSH 1.789 ?11/13/2021: ALT 26; AST 25; BUN 11; Creatinine, Ser  0.82; Hemoglobin 10.6; Magnesium 2.0; Platelets 207; Potassium 3.3; Sodium 138  ?   ?Component Value Date/Time  ? CHOL 110 11/12/2021 0042  ? CHOL 140 10/04/2021 0843  ? TRIG 23 11/12/2021 0042  ? HDL 74 11/12/2021 0042  ? HDL 93 10/04/2021 0843  ? CHOLHDL 1.5 11/12/2021 0042  ? VLDL 5 11/12/2021 0042  ? [place] 31 11/12/2021 0042  ? [place] 35 10/04/2021 0843  ? ? ?Other Studies Reviewed Today: ? ?Lexiscan Myoview 11/12/2021: ?IMPRESSION: ?1. No reversible ischemia or infarction. ?  ?2. Normal left ventricular wall motion. ?  ?3. Left ventricular ejection fraction 62% ?  ?4. Non invasive risk stratification*: Low ? ?Echocardiogram 11/12/2021: ? 1. Left ventricular ejection fraction, by estimation, is 55 to 60%. The  ?left ventricle has normal function. The left ventricle has no regional  ?wall motion abnormalities. There is mild left ventricular hypertrophy.  ?Diastolic function indeterminant due to  ? severe MAC. Elevated left atrial pressure.  ? 2. Right ventricular systolic function is normal. The right ventricular  ?size is mildly enlarged. There is severely elevated pulmonary artery  ?systolic pressure. The  estimated right ventricular systolic pressure is  ?45.6 mmHg.  ? 3. Left atrial size was severely dilated.  ? 4. Right atrial size was moderately dilated.  ? 5. The mitral valve is abnormal. Mild mitral valve regurg

## 2021-11-27 ENCOUNTER — Ambulatory Visit (INDEPENDENT_AMBULATORY_CARE_PROVIDER_SITE_OTHER): Payer: Medicare HMO

## 2021-11-27 ENCOUNTER — Other Ambulatory Visit: Payer: Self-pay

## 2021-11-27 DIAGNOSIS — Z0000 Encounter for general adult medical examination without abnormal findings: Secondary | ICD-10-CM | POA: Diagnosis not present

## 2021-11-27 NOTE — Patient Instructions (Signed)
?  Mr. Leonard Cisneros , ?Thank you for taking time to come for your Medicare Wellness Visit. I appreciate your ongoing commitment to your health goals. Please review the following plan we discussed and let me know if I can assist you in the future.  ? ?These are the goals we discussed: ? Goals   ? ?  Patient Stated   ?  Patient states that his goal is to live another day ?  ? ?  ?  ?This is a list of the screening recommended for you and due dates:  ?Health Maintenance  ?Topic Date Due  ? Tetanus Vaccine  Never done  ? Zoster (Shingles) Vaccine (1 of 2) Never done  ? Flu Shot  04/03/2021  ? Pneumonia Vaccine  Completed  ? HPV Vaccine  Aged Out  ? COVID-19 Vaccine  Discontinued  ?  ?

## 2021-11-27 NOTE — Progress Notes (Signed)
? ?Subjective:  ? Leonard Cisneros is a 81 y.o. male who presents for Medicare Annual/Subsequent preventive examination. ?I connected with  [name] on 11/27/21 by a audio enabled telemedicine application and verified that I am speaking with the correct person using two identifiers. ? ?Patient Location: Home ? ?Provider Location: Office/Clinic ? ?I discussed the limitations of evaluation and management by telemedicine. The patient expressed understanding and agreed to proceed.  ?Review of Systems    ? ?  ? ?   ?Objective:  ?  ?There were no vitals filed for this visit. ?There is no height or weight on file to calculate BMI. ? ? ?  11/27/2021  ?  4:13 PM 11/12/2021  ?  1:29 PM 11/11/2021  ?  8:27 AM 04/07/2020  ? 11:27 AM 04/08/2019  ?  9:51 AM 03/30/2019  ?  7:35 PM 03/26/2019  ? 10:33 AM  ?Advanced Directives  ?Does Patient Have a Medical Advance Directive? No No  No No No No  ?Would patient like information on creating a medical advance directive? No - Patient declined No - Patient declined No - Patient declined No - Patient declined No - Patient declined No - Patient declined No - Patient declined  ? ? ?Current Medications (verified) ?Outpatient Encounter Medications as of 11/27/2021  ?Medication Sig  ? apixaban (ELIQUIS) 5 MG TABS tablet Take 1 tablet (5 mg total) by mouth 2 (two) times daily.  ? atorvastatin (LIPITOR) 80 MG tablet TAKE 1 TABLET BY MOUTH EVERY DAY  ? diltiazem (DILT-XR) 240 MG 24 hr capsule TAKE 1 CAPSULE BY MOUTH EVERY DAY  ? metoprolol succinate (TOPROL-XL) 100 MG 24 hr tablet TAKE 1 TABLET BY MOUTH DAILY. TAKE WITH OR IMMEDIATELY FOLLOWING A MEAL.  ? naloxone (NARCAN) nasal spray 4 mg/0.1 mL naloxone 4 mg/actuation nasal spray ? TAKE BY NASAL ROUTE EVERY 3 MINUTES UNTIL PATIENT AWAKES OR EMS ARRIVES.  ? nitroGLYCERIN (NITROSTAT) 0.4 MG SL tablet Place 1 tablet (0.4 mg total) under the tongue every 5 (five) minutes x 3 doses as needed for chest pain (if no relief after 3rd dose, proceed to the  ED for an evaluation or call 911).  ? torsemide 40 MG TABS Take 40 mg by mouth daily. May take additional 40 mg tablet as needed for fluid gain.  ? chlorhexidine (PERIDEX) 0.12 % solution Use as directed 15 mLs in the mouth or throat 2 (two) times daily. (Patient not taking: Reported on 11/27/2021)  ? HYDROcodone-acetaminophen (NORCO) 5-325 MG tablet Take 1 tablet by mouth 3 (three) times daily as needed for moderate pain. (Patient not taking: Reported on 11/27/2021)  ? lidocaine (XYLOCAINE) 2 % solution Use as directed 10 mLs in the mouth or throat every 3 (three) hours as needed for mouth pain. (Patient not taking: Reported on 11/27/2021)  ? ?No facility-administered encounter medications on file as of 11/27/2021.  ? ? ?Allergies (verified) ?Augmentin [amoxicillin-pot clavulanate]  ? ?History: ?Past Medical History:  ?Diagnosis Date  ? Aortic stenosis   ? Atrial flutter ([place])   ? a. s/p remote ablation by Dr. [name].  ? CAD (coronary artery disease)   ? a. s/p CABG 2003. b. 01/2016: unstable angina - s/p BMS to SVG-ramus intermediate, patent LIMA-mLAD, patent RIMA-dRCA.   ? Chronic lower back pain   ? Essential hypertension   ? Hyperlipidemia   ? Ischemic cardiomyopathy   ? a. Cath 01/2016 -  EF 50-55% with mild distal inferior hypocontractility.  ? Peripheral neuropathy 04/04/2014  ?  Permanent atrial fibrillation ([place])   ? ?Past Surgical History:  ?Procedure Laterality Date  ? BACK SURGERY    ? CARDIAC CATHETERIZATION  ~ 2000; 2003  ? CARDIAC CATHETERIZATION N/A 02/01/2016  ? Procedure: Left Heart Cath and Cors/Grafts Angiography;  Surgeon: [name], MD;  Location: [facility];  Service: Cardiovascular;  Laterality: N/A;  ? CARDIAC CATHETERIZATION N/A 02/01/2016  ? Procedure: Coronary Stent Intervention;  Surgeon: [name], MD;  Location: [facility];  Service: Cardiovascular;  Laterality: N/A;  ? CATARACT EXTRACTION W/PHACO Left 02/09/2019  ? Procedure: CATARACT EXTRACTION PHACO AND INTRAOCULAR  LENS PLACEMENT (IOC);  Surgeon: [name], MD;  [facility];  Service: Ophthalmology;  Laterality: Left;  CDE: 27.70  ? CATARACT EXTRACTION W/PHACO Right 02/23/2019  ? Procedure: CATARACT EXTRACTION PHACO AND INTRAOCULAR LENS PLACEMENT RIGHT EYE;  Surgeon: [name], MD;  Location: [facility];  Service: Ophthalmology;  Laterality: Right;  CDE: 10.15  ? COLONOSCOPY  06/04/2002  ? Normal colon and rectum  ? COLONOSCOPY  07/08/2012  ? Procedure: COLONOSCOPY;  Surgeon: [name], MD;  [facility];  Service: Endoscopy;  Laterality: N/A;  11:30  ? COLONOSCOPY N/A 10/02/2017  ? Procedure: COLONOSCOPY;  Surgeon: [name], MD;  [facility];  Service: Endoscopy;  Laterality: N/A;  10:30am  ? CORONARY ANGIOPLASTY WITH STENT PLACEMENT  02/01/2016  ? CORONARY ARTERY BYPASS GRAFT  2003  ? LIMA to LAD,LIMA to distal RCA,SVG to ramus intermediate vessel.  ? FEMORAL REVISION Right 03/30/2019  ? Procedure: Femoral revision right total hip arthroplasty;  Surgeon: [name], MD;  [facility]: [facility];  Service: Orthopedics;  Laterality: Right;  162mn  ? FOREIGN BODY REMOVAL Right 04/02/2014  ? Procedure: FOREIGN BODY REMOVAL RIGHT FOOT;  Surgeon: MJamesetta So MD;  Location: [facility];  Service: General;  Laterality: Right;  ? JOINT REPLACEMENT    ? JOINT REPLACEMENT    ? LUMBAR DISC SURGERY    ? REVISION TOTAL HIP ARTHROPLASTY Bilateral 2005-2012  ? right-left  ? SHOULDER OPEN ROTATOR CUFF REPAIR Right   ? TOTAL HIP ARTHROPLASTY Right 1999  ? TOTAL HIP ARTHROPLASTY Left 2008  ? [place]CH[place]ARDIOGRAPHY  11/13/2011  ? LV mildly dilated,mild concentric LVH,LA mod - severely dilated,RA mildly dilated,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  ? ?Family History  ?Problem Relation Age of Onset  ? Heart attack Brother   ? Colon cancer Neg Hx   ? ?Social History  ? ?Socioeconomic History  ? Marital status: Married  ?  Spouse name: Not  on file  ? Number of children: 2  ? Years of education: Not on file  ? Highest education level: Not on file  ?Occupational History  ? Occupation: PT LTherapist, art ?  Employer: RETIRED  ?Tobacco Use  ? Smoking status: Former  ?  Packs/day: 0.50  ?  Years: 2.00  ?  Pack years: 1.00  ?  Types: Cigarettes  ?  Quit date: 09/03/1966  ?  Years since quitting: 55.2  ? Smokeless tobacco: Never  ?Vaping Use  ? Vaping Use: Never used  ?Substance and Sexual Activity  ? Alcohol use: Yes  ?  Alcohol/week: 11.0 standard drinks  ?  Types: 7 Glasses of wine, 4 Cans of beer per week  ?  Comment: a glass of wine with dinner/ beer on the weekend  ? Drug use: No  ?  Sexual activity: Not on file  ?Other Topics Concern  ? Not on file  ?Social History Narrative  ? Not on file  ? ?Social Determinants of Health  ? ?Financial Resource Strain: Not on file  ?Food Insecurity: Not on file  ?Transportation Needs: Not on file  ?Physical Activity: Not on file  ?Stress: Not on file  ?Social Connections: Not on file  ? ? ?Tobacco Counseling ?Counseling given: Not Answered ? ? ?Clinical Intake: ? ?  ? ?Pain : No/denies pain ? ?  ? ?Diabetes: No ? ?How often do you need to have someone help you when you read instructions, pamphlets, or other written materials from your doctor or pharmacy?: 1 - Never ? ?Diabetic?no ? ?  ? ?  ? ? ?Activities of Daily Living ? ?  11/24/2021  ? 10:04 AM 11/12/2021  ?  1:29 PM  ?In your present state of health, do you have any difficulty performing the following activities:  ?Hearing? 0 1  ?Vision? 0 0  ?Difficulty concentrating or making decisions? 0 0  ?Walking or climbing stairs? 0 0  ?Dressing or bathing? 0 0  ?Doing errands, shopping? 0   ?Preparing Food and eating ? N   ?Using the Toilet? N   ?In the past six months, have you accidently leaked urine? N   ?Do you have problems with loss of bowel control? N   ?Managing your Medications? N   ?Managing your Finances? N   ?Housekeeping or managing your Housekeeping? N    ? ? ?Patient Care Team: ?Renee Rival, FNP as PCP - General (Nurse Practitioner) ?Satira Sark, MD as PCP - Cardiology (Cardiology) ?Rourk, Cristopher Estimable, MD (Gastroenterology) ? ?Indicate a

## 2021-12-05 ENCOUNTER — Encounter: Payer: Self-pay | Admitting: Nurse Practitioner

## 2021-12-05 ENCOUNTER — Ambulatory Visit (INDEPENDENT_AMBULATORY_CARE_PROVIDER_SITE_OTHER): Payer: Medicare HMO | Admitting: Nurse Practitioner

## 2021-12-05 VITALS — BP 112/56 | HR 72 | Ht 71.0 in | Wt 217.0 lb

## 2021-12-05 DIAGNOSIS — I10 Essential (primary) hypertension: Secondary | ICD-10-CM

## 2021-12-05 DIAGNOSIS — I739 Peripheral vascular disease, unspecified: Secondary | ICD-10-CM

## 2021-12-05 DIAGNOSIS — I272 Pulmonary hypertension, unspecified: Secondary | ICD-10-CM

## 2021-12-05 DIAGNOSIS — R2681 Unsteadiness on feet: Secondary | ICD-10-CM | POA: Diagnosis not present

## 2021-12-05 DIAGNOSIS — I4891 Unspecified atrial fibrillation: Secondary | ICD-10-CM | POA: Diagnosis not present

## 2021-12-05 DIAGNOSIS — I5033 Acute on chronic diastolic (congestive) heart failure: Secondary | ICD-10-CM | POA: Diagnosis not present

## 2021-12-05 DIAGNOSIS — R69 Illness, unspecified: Secondary | ICD-10-CM | POA: Diagnosis not present

## 2021-12-05 DIAGNOSIS — E785 Hyperlipidemia, unspecified: Secondary | ICD-10-CM

## 2021-12-05 DIAGNOSIS — F1010 Alcohol abuse, uncomplicated: Secondary | ICD-10-CM

## 2021-12-05 NOTE — Patient Instructions (Addendum)
Please get your shingles vaccine and TDAP vaccine at your pharmacy.  ?Please get your flu vaccine today.  ? ?It is important that you exercise regularly as tolerated  at least 30 minutes 5 times a week.  ?Think about what you will eat, plan ahead. ?Choose " clean, green, fresh or frozen" over canned, processed or packaged foods which are more sugary, salty and fatty. ?70 to 75% of food eaten should be vegetables and fruit. ?Three meals at set times with snacks allowed between meals, but they must be fruit or vegetables. ?Aim to eat over a 12 hour period , example 7 am to 7 pm, and STOP after  your last meal of the day. ?Drink water,generally about 64 ounces per day, no other drink is as healthy. Fruit juice is best enjoyed in a healthy way, by EATING the fruit. ? ?Thanks for choosing [facility], we consider it a privelige to serve you.  ?

## 2021-12-05 NOTE — Assessment & Plan Note (Signed)
Chronic stable condition ?Uses a cane. ?Need to prevent falls discussed with patient he verbalized understanding.  ?

## 2021-12-05 NOTE — Assessment & Plan Note (Signed)
Patient continues to drink alcohol and wine. ?Need to avoid alcohol due to risk of getting intoxicated and  fall discussed with patient patient verbalized understanding but he states that he will continue to drink.  ?

## 2021-12-05 NOTE — Assessment & Plan Note (Signed)
On Eliquis 5 mg twice daily, diltiazem to 40 mg daily ?Patient encouraged to maintain close follow-up with cardiology he verbalized understanding.  ?

## 2021-12-05 NOTE — Assessment & Plan Note (Signed)
BP Readings from Last 3 Encounters:  ?12/05/21 (!) 112/56  ?11/16/21 122/64  ?11/13/21 (!) 110/57  ?Chronic condition well-controlled on metoprolol 100 mg daily, diltiazem 240 milligrams daily, torsemide 40 mg daily ?Continue current medications. ?Follow-up with cardiology as planned. ?

## 2021-12-05 NOTE — Assessment & Plan Note (Signed)
On atorvastatin 80 mg daily. ?Last lipid panel under control ?Denies leg pain, numbness ?

## 2021-12-05 NOTE — Progress Notes (Signed)
? ?New Patient Office Visit ? ?Subjective:  ?Patient ID: Leonard Cisneros, male    DOB: 05-28-1941  Age: 81 y.o. MRN: [id number] ? ?CC:  ?Chief Complaint  ?Patient presents with  ? New Patient (Initial Visit)  ?  NP  ? ? ?HPI ?[name] with past medical history of essential hypertension, PAD, A-fib with RVR, CHF presents to establish care for his chronic medical conditions. Previous patient of Dr. Wolfgang Phoenix at [place]. He was recently admitted in the hospital for severe pulmonary HTN, acute on chronic diastolic CHF He did see cardiology after his hospital admission. Pt denies SOB, edema, CP , palpitations. He has been checking his weight daily at home and taking all medications as prescribed. He has upcoming appointment with cardiology.  ? ?He Continues to drink a couple of glasses of wine and a beer daily, pt states that he is not going to stop drinking, pt stated that I'm just going to enjoy myself,  ? ?Due for shingles vaccine, TDAP vaccine , flu vaccine.  Need for recommended vaccines discussed with patient he verbalized understanding.  ? ?Past Medical History:  ?Diagnosis Date  ? Aortic stenosis   ? Atrial flutter ([place])   ? a. s/p remote ablation by Dr. [name].  ? CAD (coronary artery disease)   ? a. s/p CABG 2003. b. 01/2016: unstable angina - s/p BMS to SVG-ramus intermediate, patent LIMA-mLAD, patent RIMA-dRCA.   ? Chronic lower back pain   ? Essential hypertension   ? Hyperlipidemia   ? Ischemic cardiomyopathy   ? a. Cath 01/2016 -  EF 50-55% with mild distal inferior hypocontractility.  ? Peripheral neuropathy 04/04/2014  ? Permanent atrial fibrillation ([place])   ? ? ?Past Surgical History:  ?Procedure Laterality Date  ? BACK SURGERY    ? CARDIAC CATHETERIZATION  ~ 2000; 2003  ? CARDIAC CATHETERIZATION N/A 02/01/2016  ? Procedure: Left Heart Cath and Cors/Grafts Angiography;  Surgeon: [name], MD;  Location: [facility];  Service: Cardiovascular;  Laterality: N/A;  ?  CARDIAC CATHETERIZATION N/A 02/01/2016  ? Procedure: Coronary Stent Intervention;  Surgeon: [name], MD;  Location: [facility];  Service: Cardiovascular;  Laterality: N/A;  ? CATARACT EXTRACTION W/PHACO Left 02/09/2019  ? Procedure: CATARACT EXTRACTION PHACO AND INTRAOCULAR LENS PLACEMENT (IOC);  Surgeon: [name], MD;  [facility];  Service: Ophthalmology;  Laterality: Left;  CDE: 27.70  ? CATARACT EXTRACTION W/PHACO Right 02/23/2019  ? Procedure: CATARACT EXTRACTION PHACO AND INTRAOCULAR LENS PLACEMENT RIGHT EYE;  Surgeon: [name], MD;  [facility];  Service: Ophthalmology;  Laterality: Right;  CDE: 10.15  ? COLONOSCOPY  06/04/2002  ? Normal colon and rectum  ? COLONOSCOPY  07/08/2012  ? Procedure: COLONOSCOPY;  Surgeon: [name], MD;  [facility];  Service: Endoscopy;  Laterality: N/A;  11:30  ? COLONOSCOPY N/A 10/02/2017  ? Procedure: COLONOSCOPY;  Surgeon: [name], MD;  [facility];  Service: Endoscopy;  Laterality: N/A;  10:30am  ? CORONARY ANGIOPLASTY WITH STENT PLACEMENT  02/01/2016  ? CORONARY ARTERY BYPASS GRAFT  2003  ? LIMA to LAD,LIMA to distal RCA,SVG to ramus intermediate vessel.  ? FEMORAL REVISION Right 03/30/2019  ? Procedure: Femoral revision right total hip arthroplasty;  Surgeon: [name], MD;  [facility];  Service: Orthopedics;  Laterality: Right;  121mn  ? FOREIGN BODY REMOVAL Right 04/02/2014  ? Procedure: FOREIGN BODY REMOVAL RIGHT FOOT;  Surgeon: MElta Guadeloupe  Lowella Petties, MD;  [facility];  Service: General;  Laterality: Right;  ? JOINT REPLACEMENT    ? JOINT REPLACEMENT    ? LUMBAR DISC SURGERY    ? REVISION TOTAL HIP ARTHROPLASTY Bilateral 2005-2012  ? right-left  ? SHOULDER OPEN ROTATOR CUFF REPAIR Right   ? TOTAL HIP ARTHROPLASTY Right 1999  ? TOTAL HIP ARTHROPLASTY Left 2008  ? US ECHOCARDIOGRAPHY  11/13/2011  ? LV mildly dilated,mild concentric LVH,LA mod - severely dilated,RA mildly dilated,mild to mod. mitral  annular ca+,mild to mod MR,aortic root ca+ w/mild dilatation,bicuspid AOV cannot be excluded.  ? ? ?Family History  ?Problem Relation Age of Onset  ? Heart attack Brother   ? Colon cancer Neg Hx   ? ? ?Social History  ? ?Socioeconomic History  ? Marital status: Married  ?  Spouse name: Not on file  ? Number of children: 2  ? Years of education: Not on file  ? Highest education level: Not on file  ?Occupational History  ? Occupation: PT Therapist, art  ?  Employer: RETIRED  ?Tobacco Use  ? Smoking status: Former  ?  Packs/day: 0.50  ?  Years: 2.00  ?  Pack years: 1.00  ?  Types: Cigarettes  ?  Quit date: 09/03/1966  ?  Years since quitting: 55.2  ? Smokeless tobacco: Never  ?Vaping Use  ? Vaping Use: Never used  ?Substance and Sexual Activity  ? Alcohol use: Yes  ?  Alcohol/week: 11.0 standard drinks  ?  Types: 7 Glasses of wine, 4 Cans of beer per week  ?  Comment: a glass of wine with dinner/ beer on the weekend  ? Drug use: No  ? Sexual activity: Not on file  ?Other Topics Concern  ? Not on file  ?Social History Narrative  ? Lives with his wife.  ? ?Social Determinants of Health  ? ?Financial Resource Strain: Not on file  ?Food Insecurity: Not on file  ?Transportation Needs: Not on file  ?Physical Activity: Not on file  ?Stress: Not on file  ?Social Connections: Not on file  ?Intimate Partner Violence: Not on file  ? ? ?ROS ?Review of Systems  ?Constitutional: Negative.   ?Respiratory: Negative.    ?Cardiovascular: Negative.   ?Gastrointestinal: Negative.   ?Musculoskeletal:  Positive for gait problem.  ?Neurological:  Negative for tremors, seizures, syncope, speech difficulty, weakness, light-headedness, numbness and headaches.  ?Psychiatric/Behavioral: Negative.    ? ?Objective:  ? ?Today's Vitals: BP (!) 112/56 (BP Location: Right Arm, Cuff Size: Normal)   Pulse 72   Ht '5\' 11"'$  (1.803 m)   Wt 217 lb (98.4 kg)   SpO2 93%   BMI 30.27 kg/m?  ? ?Physical Exam ?Constitutional:   ?   General: He is not in  acute distress. ?   Appearance: He is obese. He is not ill-appearing, toxic-appearing or diaphoretic.  ?Cardiovascular:  ?   Rate and Rhythm: Rhythm irregular.  ?   Heart sounds: No murmur heard. ?  No friction rub. No gallop.  ?Pulmonary:  ?   Effort: Pulmonary effort is normal. No respiratory distress.  ?   Breath sounds: Normal breath sounds. No stridor. No wheezing, rhonchi or rales.  ?Chest:  ?   Chest wall: No tenderness.  ?Musculoskeletal:     ?   General: No swelling, tenderness, deformity or signs of injury.  ?   Right lower leg: Edema present.  ?   Left lower leg: Edema present.  ?  Comments: Uses a cane for balancing. +1 pitting edema to BLE  ?Neurological:  ?   Mental Status: He is alert and oriented to person, place, and time.  ?   Cranial Nerves: No cranial nerve deficit.  ?   Sensory: No sensory deficit.  ?   Motor: No weakness.  ?   Coordination: Coordination normal.  ?Psychiatric:     ?   Mood and Affect: Mood normal.     ?   Behavior: Behavior normal.     ?   Thought Content: Thought content normal.     ?   Judgment: Judgment normal.  ? ? ?Assessment & Plan:  ? ?Problem List Items Addressed This Visit   ? ?  ? Cardiovascular and Mediastinum  ? Essential hypertension  ?  BP Readings from Last 3 Encounters:  ?12/05/21 (!) 112/56  ?11/16/21 122/64  ?11/13/21 (!) 110/57  ?Chronic condition well-controlled on metoprolol 100 mg daily, diltiazem 240 milligrams daily, torsemide 40 mg daily ?Continue current medications. ?Follow-up with cardiology as planned. ?  ?  ? PAD (peripheral artery disease) ([place])  ?  On atorvastatin 80 mg daily. ?Last lipid panel under control ?Denies leg pain, numbness ?  ?  ? Atrial fibrillation with RVR ([place]) - Primary  ?  On Eliquis 5 mg twice daily, diltiazem to 40 mg daily ?Patient encouraged to maintain close follow-up with cardiology he verbalized understanding.  ?  ?  ? Acute on chronic diastolic CHF (congestive heart failure) ([place])  ?  Left ventricular ejection fraction,  by estimation, is 55 to 60% on most recernt eccho ?+1 pitting edema to bilateral lower extremities noted today.  Patient states that his edema is much improved.  ?Takes torsemide 40 mg daily, Lasix stopped at la

## 2021-12-05 NOTE — Assessment & Plan Note (Addendum)
Left ventricular ejection fraction, by estimation, is 55 to 60% on most recernt eccho ?+1 pitting edema to bilateral lower extremities noted today.  Patient states that his edema is much improved.  ?Takes torsemide 40 mg daily, Lasix stopped at last follow-up with cardiology ?Has followed up with cardiology since he left the hospital, has upcoming  follow-up appointments with labs. ?Patient encouraged to continue to monitor his weight daily at home, DASH diet advised.  ?Wt Readings from Last 3 Encounters:  ?12/05/21 217 lb (98.4 kg)  ?11/16/21 228 lb 3.2 oz (103.5 kg)  ?11/13/21 219 lb 9.3 oz (99.6 kg)  ? ?

## 2021-12-05 NOTE — Assessment & Plan Note (Signed)
Recently treated in the hospital for severe pulmonary hypertension has history of CHF. ?Patient has followed up with cardiology currently taking torsemide 40 mg daily ?Patient encouraged to maintain close follow-up with cardiology he verbalized understanding.  ?

## 2021-12-05 NOTE — Assessment & Plan Note (Addendum)
Lab Results  ?Component Value Date  ? CHOL 110 11/12/2021  ? HDL 74 11/12/2021  ? [place] 31 11/12/2021  ? TRIG 23 11/12/2021  ? CHOLHDL 1.5 11/12/2021  ?On atorvastatin 80 mg daily ?Condition well-controlled continue current medication ?

## 2021-12-06 LAB — CBC
Hematocrit: 30.1 % — ABNORMAL LOW (ref 37.5–51.0)
Hemoglobin: 10.9 g/dL — ABNORMAL LOW (ref 13.0–17.7)
MCH: 36.7 pg — ABNORMAL HIGH (ref 26.6–33.0)
MCHC: 36.2 g/dL — ABNORMAL HIGH (ref 31.5–35.7)
MCV: 101 fL — ABNORMAL HIGH (ref 79–97)
Platelets: 267 10*3/uL (ref 150–450)
RBC: 2.97 x10E6/uL — ABNORMAL LOW (ref 4.14–5.80)
RDW: 13.8 % (ref 11.6–15.4)
WBC: 10 10*3/uL (ref 3.4–10.8)

## 2021-12-06 NOTE — Progress Notes (Signed)
Labs is stable , pt has chronic anemia probably from his CHF, pt should report bleeding, will pursue further work at his next visit

## 2021-12-07 ENCOUNTER — Other Ambulatory Visit: Payer: Self-pay | Admitting: Cardiology

## 2021-12-11 ENCOUNTER — Telehealth: Payer: Self-pay | Admitting: Cardiology

## 2021-12-11 MED ORDER — APIXABAN 5 MG PO TABS: 5.0000 mg | ORAL_TABLET | Freq: Two times a day (BID) | ORAL | 11 refills | Status: DC

## 2021-12-11 NOTE — Telephone Encounter (Signed)
Patient needs refill for eliquis, having trouble when contacting pharmacy about it. ?

## 2021-12-11 NOTE — Telephone Encounter (Signed)
Prescription refill request for Eliquis received. ?Indication:  Atrial fib ?Last office visit: 11/16/21  Myles Gip MD ?Scr: 0.82 on 11/13/21 ?Age: 81 ?Weight: 103.5kg ? ?Based on above findings Eliquis '5mg'$  twice daily is the appropriate dose.  Refill approved. ? ?

## 2022-01-04 ENCOUNTER — Other Ambulatory Visit: Payer: Self-pay | Admitting: Cardiology

## 2022-01-04 NOTE — Telephone Encounter (Signed)
Warfarin refill received, however, the pt was switched to Eliquis '5mg'$ ; denied warfarin refill and added comment.  ?

## 2022-01-26 ENCOUNTER — Encounter: Payer: Self-pay | Admitting: Physician Assistant

## 2022-01-26 ENCOUNTER — Ambulatory Visit (INDEPENDENT_AMBULATORY_CARE_PROVIDER_SITE_OTHER): Payer: Medicare HMO | Admitting: Physician Assistant

## 2022-01-26 ENCOUNTER — Other Ambulatory Visit (HOSPITAL_COMMUNITY)
Admission: RE | Admit: 2022-01-26 | Discharge: 2022-01-26 | Disposition: A | Discharge status: Home / Self Care | Payer: Medicare HMO | Source: Ambulatory Visit | Attending: Cardiology | Admitting: Cardiology

## 2022-01-26 VITALS — BP 110/60 | HR 85 | Ht 71.0 in | Wt 214.8 lb

## 2022-01-26 DIAGNOSIS — I4821 Permanent atrial fibrillation: Secondary | ICD-10-CM

## 2022-01-26 DIAGNOSIS — I2510 Atherosclerotic heart disease of native coronary artery without angina pectoris: Secondary | ICD-10-CM

## 2022-01-26 DIAGNOSIS — I255 Ischemic cardiomyopathy: Secondary | ICD-10-CM

## 2022-01-26 DIAGNOSIS — E785 Hyperlipidemia, unspecified: Secondary | ICD-10-CM

## 2022-01-26 LAB — BASIC METABOLIC PANEL
Anion gap: 5 (ref 5–15)
BUN: 17 mg/dL (ref 8–23)
CO2: 29 mmol/L (ref 22–32)
Calcium: 8.6 mg/dL — ABNORMAL LOW (ref 8.9–10.3)
Chloride: 105 mmol/L (ref 98–111)
Creatinine, Ser: 0.99 mg/dL (ref 0.61–1.24)
GFR, Estimated: >60 mL/min (ref >60)
Glucose, Bld: 107 mg/dL — ABNORMAL HIGH (ref 70–99)
Potassium: 3.6 mmol/L (ref 3.5–5.1)
Sodium: 139 mmol/L (ref 135–145)

## 2022-01-26 NOTE — Patient Instructions (Signed)
Medication Instructions:  Your physician recommends that you continue on your current medications as directed. Please refer to the Current Medication list given to you today.   Labwork: BMET today  Testing/Procedures: None today  Follow-Up: 6 months  Any Other Special Instructions Will Be Listed Below (If Applicable).  If you need a refill on your cardiac medications before your next appointment, please call your pharmacy.  

## 2022-01-26 NOTE — Progress Notes (Signed)
Cardiology Office Note   Date:  01/26/2022   ID:  Leonard Cisneros, DOB 07-08-1941, MRN [id number]  PCP:  Renee Rival, FNP Cardiologist:  Rozann Lesches, MD 11/16/2021 Electrphysiologist: None Rosaria Ferries, PA-C   No chief complaint on file.   History of Present Illness: Leonard Cisneros is a 81 y.o. male with a history of atrial flutter s/p ablation (GT), Perm Afib on Eliquis, CABG 2003 w/  LIMA-LAD, RIMA-RCA, SVG-OM, BMS>>SVG w/ LIMA-mLAD and RIMA-dRCA ok, HTN, HLD, ICM w/ EF 45-50% 2013 >> now normal, mod AS, HFpEF  03/16 Office visit, pt reported improved sx but wt was up 9 lbs at 228. Lasix >> Demadex 40 mg qd, extra tab prn, BMET 2 weeks >> not done   Leonard Cisneros presents for cardiology follow up  He has been working on weight loss, with some success. Still does yard work but cannot do what he used to.  He walks with 2 canes because of balance issues.  However, he still tries to keep busy and be as active as possible.  Has not had chest pain in a long time.  No LE edema, no orthopnea or PND.  No presyncope or syncope.   Palpitations.  Cooks very well at home, many cookbooks and does complex dishes.  He enjoys cooking for his wife of 37 years.    Past Medical History:  Diagnosis Date   Aortic stenosis    Atrial flutter ([place])    a. s/p remote ablation by Dr. [name].   CAD (coronary artery disease)    a. s/p CABG 2003. b. 01/2016: unstable angina - s/p BMS to SVG-ramus intermediate, patent LIMA-mLAD, patent RIMA-dRCA.    Chronic lower back pain    Essential hypertension    Hyperlipidemia    Ischemic cardiomyopathy    a. Cath 01/2016 -  EF 50-55% with mild distal inferior hypocontractility.   Peripheral neuropathy 04/04/2014   Permanent atrial fibrillation [facility])     Past Surgical History:  Procedure Laterality Date   BACK SURGERY     CARDIAC CATHETERIZATION  ~ 2000; 2003   CARDIAC CATHETERIZATION N/A 02/01/2016   Procedure: Left Heart Cath  and Cors/Grafts Angiography;  Surgeon: [name], MD;  Location: [place] [facility];  Service: Cardiovascular;  Laterality: N/A;   CARDIAC CATHETERIZATION N/A 02/01/2016   Procedure: Coronary Stent Intervention;  Surgeon: [name], MD;  Location: [place] [facility];  Service: Cardiovascular;  Laterality: N/A;   CATARACT EXTRACTION W/PHACO Left 02/09/2019   Procedure: CATARACT EXTRACTION PHACO AND INTRAOCULAR LENS PLACEMENT (IOC);  Surgeon: [name], MD;  Location: [facility];  Service: Ophthalmology;  Laterality: Left;  CDE: 27.70   CATARACT EXTRACTION W/PHACO Right 02/23/2019   Procedure: CATARACT EXTRACTION PHACO AND INTRAOCULAR LENS PLACEMENT RIGHT EYE;  Surgeon: [name], MD;  Location: [facility];  Service: Ophthalmology;  Laterality: Right;  CDE: 10.15   COLONOSCOPY  06/04/2002   Normal colon and rectum   COLONOSCOPY  07/08/2012   Procedure: COLONOSCOPY;  Surgeon: [name], MD;  Location: [facility];  Service: Endoscopy;  Laterality: N/A;  11:30   COLONOSCOPY N/A 10/02/2017   Procedure: COLONOSCOPY;  Surgeon: [name], MD;  Location: [facility];  Service: Endoscopy;  Laterality: N/A;  10:30am   CORONARY ANGIOPLASTY WITH STENT PLACEMENT  02/01/2016   CORONARY ARTERY BYPASS GRAFT  2003   LIMA to LAD,LIMA to distal RCA,SVG to ramus intermediate vessel.   FEMORAL REVISION Right  03/30/2019   Procedure: Femoral revision right total hip arthroplasty;  Surgeon: [name], MD;  Location: [facility];  Service: Orthopedics;  Laterality: Right;  161mn   FOREIGN BODY REMOVAL Right 04/02/2014   Procedure: FOREIGN BODY REMOVAL RIGHT FOOT;  Surgeon: MJamesetta So MD;  Location: [facility];  Service: General;  Laterality: Right;   JOINT REPLACEMENT     JOINT REPLACEMENT     LUMBAR [place]URGERY     REVISION TOTAL HIP ARTHROPLASTY Bilateral 2005-2012   right-left   SHOULDER OPEN ROTATOR CUFF REPAIR Right    TOTAL HIP ARTHROPLASTY Right 1999   TOTAL HIP ARTHROPLASTY Left 2008    [place]  11/13/2011   LV mildly dilated,mild concentric LVH,LA mod - severely dilated,RA mildly dilated,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    Current Outpatient Medications  Medication Sig Dispense Refill   apixaban (ELIQUIS) 5 MG TABS tablet Take 1 tablet (5 mg total) by mouth 2 (two) times daily. 60 tablet 11   atorvastatin (LIPITOR) 80 MG tablet TAKE 1 TABLET BY MOUTH EVERY DAY 90 tablet 3   diltiazem (DILT-XR) 240 MG 24 hr capsule TAKE 1 CAPSULE BY MOUTH EVERY DAY 90 capsule 3   metoprolol succinate (TOPROL-XL) 100 MG 24 hr tablet TAKE 1 TABLET BY MOUTH DAILY. TAKE WITH OR IMMEDIATELY FOLLOWING A MEAL. 90 tablet 1   nitroGLYCERIN (NITROSTAT) 0.4 MG SL tablet Place 1 tablet (0.4 mg total) under the tongue every 5 (five) minutes x 3 doses as needed for chest pain (if no relief after 3rd dose, proceed to the ED for an evaluation or call 911). 25 tablet 3   torsemide 40 MG TABS Take 40 mg by mouth daily. May take additional 40 mg tablet as needed for fluid gain. 90 tablet 3   chlorhexidine (PERIDEX) 0.12 % solution Use as directed 15 mLs in the mouth or throat 2 (two) times daily. (Patient not taking: Reported on 12/05/2021) 120 mL 0   HYDROcodone-acetaminophen (NORCO) 5-325 MG tablet Take 1 tablet by mouth 3 (three) times daily as needed for moderate pain. (Patient not taking: Reported on 12/05/2021) 30 tablet 0   lidocaine (XYLOCAINE) 2 % solution Use as directed 10 mLs in the mouth or throat every 3 (three) hours as needed for mouth pain. (Patient not taking: Reported on 12/05/2021) 100 mL 0   naloxone (NARCAN) nasal spray 4 mg/0.1 mL naloxone 4 mg/actuation nasal spray  TAKE BY NASAL ROUTE EVERY 3 MINUTES UNTIL PATIENT AWAKES OR EMS ARRIVES. (Patient not taking: Reported on 12/05/2021)     No current facility-administered medications for this visit.    Allergies:   Augmentin [amoxicillin-pot clavulanate]     Social History:  The patient  reports that he quit smoking about 55 years ago. His smoking use included cigarettes. He has a 1.00 pack-year smoking history. He has never used smokeless tobacco. He reports current alcohol use of about 11.0 standard drinks per week. He reports that he does not use drugs.   Family History:  The patient's family history includes Heart attack in his brother.  He indicated that his mother is deceased. He indicated that his father is deceased. He indicated that his sister is deceased. He indicated that his brother is deceased. He indicated that the status of his neg hx is unknown.    ROS:  Please see the history of present illness. All other systems are reviewed and negative.  PHYSICAL EXAM: VS:  BP 110/60   Pulse 85   Ht _0  (1.803 m)   Wt 214 lb 12.8 oz (97.4 kg)   SpO2 96%   BMI 29.96 kg/m  , BMI Body mass index is 29.96 kg/m. GEN: Well nourished, well developed, male in no acute distress HEENT: normal for age  Neck: no JVD, no carotid bruit, no masses Cardiac: irRegular rate and rhythm; 2/6 classic AS murmur, no rubs, or gallops Respiratory:  clear to auscultation bilaterally, normal work of breathing GI: soft, nontender, nondistended, + BS MS: no deformity or atrophy; no edema; distal pulses are 2+ in all 4 extremities  Skin: warm and dry, no rash; thickened skin on his lower extremities is noted Neuro:  Strength and sensation are intact Psych: euthymic mood, full affect   EKG:  EKG is not ordered today.   ECHO: 11/12/2021  1. Left ventricular ejection fraction, by estimation, is 55 to 60%. The  left ventricle has normal function. The left ventricle has no regional  wall motion abnormalities. There is mild left ventricular hypertrophy.  Diastolic function indeterminant due to  severe MAC. Elevated left atrial pressure.   2. Right ventricular systolic function is normal. The right ventricular  size is mildly enlarged. There is severely  elevated pulmonary artery  systolic pressure. The estimated right ventricular systolic pressure is  65.6 mmHg.   3. Left atrial size was severely dilated.   4. Right atrial size was moderately dilated.   5. The mitral valve is abnormal. Mild mitral valve regurgitation. Severe  mitral annular calcification.   6. The aortic valve has an indeterminant number of cusps. There is  moderate calcification of the aortic valve. There is severe thickening of  the aortic valve. Aortic valve regurgitation is not visualized. Moderate  aortic valve stenosis. Aortic valve  mean gradient measures 27.0 mmHg. Aortic valve Vmax measures 3.42 m/s. AVA  by continuity 1.44cm2, DI 0.31   7. Aortic dilatation noted. There is mild dilatation of the ascending  aorta, measuring 41 mm.   8. The inferior vena cava is dilated in size with <50% respiratory  variability, suggesting right atrial pressure of 15 mmHg.   Comparison(s): Compared to prior TTE 03/2021, there continues to be  moderate AS (previous mean graident 4mHg; now 245mg). There is now  severe pulmonary HTN (previously not reported).   CATH: 02/01/2016 Ost LAD lesion, 100% stenosed. Ost Ramus lesion, 100% stenosed. Prox RCA lesion, 70% stenosed. Mid RCA lesion, 50% stenosed. RIMA-RCA ok LIMA-LAD ok Prox LAD to Mid LAD lesion, 75% stenosed. SVG-OM 99%  OM Origin lesion, 99% stenosed. Post intervention, there is a 0% residual stenosis. The left ventricular systolic function is normal.   Low normal LV function with a subtle region of distal inferior hypocontractility.   Significant 2 vessel CAD with total occlusion of the LAD and ramus intermediate at its origin; normal left circumflex coronary artery; and RCA with 70% proximal stenosis with diffuse 50% mid stenoses with a " flush and fill" phenomena seen in the distal RCA due to competitive filling the the RIMA graft.   Patent LIMA graft supplying the mid LAD.   Patent RIMA graft supplying the  distal RCA.   SVG supplying the ramus intermediate vessel with a 99% near ostial subtotal stenosis with thrombus.   Successful PCI to the SVG with ultimate insertion of a 2.512 mm NirXcell bare metal stent postdilated 2.64 mm w/ a 99% stenosis being reduced to 0% resumption of brisk  TIMI-3 flow. Intervention    MYOVIEW: 11/12/2021: IMPRESSION: 1. No reversible ischemia or infarction.   2. Normal left ventricular wall motion.   3. Left ventricular ejection fraction 62%   4. Non invasive risk stratification*: Low    Recent Labs: 11/11/2021: B Natriuretic Peptide 152.0; TSH 1.789 11/13/2021: ALT 26; BUN 11; Creatinine, Ser 0.82; Magnesium 2.0; Potassium 3.3; Sodium 138 12/05/2021: Hemoglobin 10.9; Platelets 267  CBC    Component Value Date/Time   WBC 10.0 12/05/2021 1613   WBC 7.3 11/13/2021 0110   RBC 2.97 (L) 12/05/2021 1613   RBC 2.95 (L) 11/13/2021 0110   HGB 10.9 (L) 12/05/2021 1613   HCT 30.1 (L) 12/05/2021 1613   PLT 267 12/05/2021 1613   MCV 101 (H) 12/05/2021 1613   MCH 36.7 (H) 12/05/2021 1613   MCH 35.9 (H) 11/13/2021 0110   MCHC 36.2 (H) 12/05/2021 1613   MCHC 33.5 11/13/2021 0110   RDW 13.8 12/05/2021 1613   LYMPHSABS 1.4 11/13/2021 0110   LYMPHSABS 1.7 04/20/2015 0742   MONOABS 0.8 11/13/2021 0110   EOSABS 0.6 (H) 11/13/2021 0110   EOSABS 0.6 (H) 04/20/2015 0742   BASOSABS 0.1 11/13/2021 0110   BASOSABS 0.0 04/20/2015 0742      Latest Ref Rng & Units 11/13/2021    1:10 AM 11/12/2021   12:42 AM 11/11/2021    8:25 AM  CMP  Glucose 70 - 99 mg/dL 103   95   115    BUN 8 - 23 mg/dL _0 Creatinine 0.61 - 1.24 mg/dL 0.82   0.84   0.66    Sodium 135 - 145 mmol/L 138   135   131    Potassium 3.5 - 5.1 mmol/L 3.3   3.9   3.5    Chloride 98 - 111 mmol/L 101   102   98    CO2 22 - 32 mmol/L _1 Calcium 8.9 - 10.3 mg/dL 8.7   8.6   8.6    Total Protein 6.5 - 8.1 g/dL 6.5    7.1    Total Bilirubin 0.3 - 1.2 mg/dL 1.9    1.3    Alkaline Phos  38 - 126 U/L 191    209    AST 15 - 41 U/L 25    30    ALT 0 - 44 U/L 26    31       Lipid Panel Lab Results  Component Value Date   CHOL 110 11/12/2021   HDL 74 11/12/2021   LDLCALC 31 11/12/2021   TRIG 23 11/12/2021   CHOLHDL 1.5 11/12/2021      Wt Readings from Last 3 Encounters:  01/26/22 214 lb 12.8 oz (97.4 kg)  12/05/21 217 lb (98.4 kg)  11/16/21 228 lb 3.2 oz (103.5 kg)     Other studies Reviewed: Additional studies/ records that were reviewed today include: Office notes, hospital records and testing.  ASSESSMENT AND PLAN:  1.  CAD: -He is not having any ischemic symptoms.  He is not on aspirin because of the apixaban.  Continue Toprol-XL, and atorvastatin. -No need for additional testing at this time  2.  Dyslipidemia: - Recent lipid profile showed an LDL of 31. - Continue Lipitor 80 mg daily  3.  Permanent atrial fibrillation: - He is on Toprol-XL 100 mg daily and diltiazem 240 mg daily. -His heart rate is 85 today in  the office - He denies presyncope or syncope -He is not having any symptoms attributable to the atrial fibrillation, no bleeding issues - Continue apixaban  4. HFpEF, ICM, pulmonary hypertension: -He was put on torsemide 40 mg daily with an extra dose as needed - Instead of taking the second dose of torsemide as needed, he took it daily - He feels well and is not having any problems with being lightheaded or dizzy, no evidence of dehydration -Check a BMET today, continue current therapy   Current medicines are reviewed at length with the patient today.  The patient does not have concerns regarding medicines.  The following changes have been made:  no change  Labs/ tests ordered today include:   Orders Placed This Encounter  Procedures   Basic metabolic panel     Disposition:   FU with Rozann Lesches, MD  Signed, Rosaria Ferries, PA-C  01/26/2022 3:33 PM    [facility] Phone: [phone]; Fax: [phone]

## 2022-02-01 ENCOUNTER — Ambulatory Visit: Payer: Medicare HMO | Admitting: Cardiology

## 2022-02-16 ENCOUNTER — Other Ambulatory Visit: Payer: Self-pay | Admitting: Cardiology

## 2022-03-29 ENCOUNTER — Other Ambulatory Visit: Payer: Medicare HMO

## 2022-04-06 ENCOUNTER — Encounter: Payer: Self-pay | Admitting: Nurse Practitioner

## 2022-04-06 ENCOUNTER — Ambulatory Visit (INDEPENDENT_AMBULATORY_CARE_PROVIDER_SITE_OTHER): Payer: Medicare HMO | Admitting: Nurse Practitioner

## 2022-04-06 VITALS — BP 100/58 | HR 64 | Ht 71.0 in | Wt 212.0 lb

## 2022-04-06 DIAGNOSIS — I10 Essential (primary) hypertension: Secondary | ICD-10-CM | POA: Diagnosis not present

## 2022-04-06 DIAGNOSIS — E785 Hyperlipidemia, unspecified: Secondary | ICD-10-CM | POA: Diagnosis not present

## 2022-04-06 DIAGNOSIS — E663 Overweight: Secondary | ICD-10-CM | POA: Insufficient documentation

## 2022-04-06 DIAGNOSIS — I4891 Unspecified atrial fibrillation: Secondary | ICD-10-CM | POA: Diagnosis not present

## 2022-04-06 NOTE — Progress Notes (Signed)
   Leonard Cisneros     MRN: [id number]      DOB: 16-Sep-1940   HPI Leonard Cisneros with past medical history of essential hypertension, PAD, A-fib severe pulmonary hypertension, moderate aortic stenosis is here for follow up and re-evaluation of chronic medical conditions, medication management   Denies any complaints today, was at the beach with his family recently, reports that he enjoyed going to the beach. Still does some yard work every day walks with 2 canes, likes canes better during using a walker, does a lot of cooking at home.  He and his wife just celebrated their wedding anniversary.  Patient denies hematemesis, bloody stool, bleeding  He has been taking torsemide 40 mg daily because he does not want his lower extremities to swell.     ROS Denies recent fever or chills. Denies sinus pressure, nasal congestion, ear pain or sore throat. Denies chest congestion, productive cough or wheezing. Denies chest pains, palpitations and leg swelling Denies abdominal pain, nausea, vomiting,diarrhea or constipation.   Denies dysuria, frequency, hesitancy or incontinence. Denies headaches, seizures, numbness, or tingling. Denies depression, anxiety or insomnia.    PE  BP (!) 100/58 (BP Location: Left Arm, Cuff Size: Normal)   Pulse 64   Ht '5\' 11"'$  (1.803 m)   Wt 212 lb (96.2 kg)   SpO2 93%   BMI 29.57 kg/m   Patient alert and oriented and in no cardiopulmonary distress.  Chest: Clear to auscultation bilaterally.  [name]: S1, S2,  murmurs, Regular rate.  ABD: Soft non tender.   Ext: No edema  MS: Decreased ROM spine, shoulders, hips and knees.  Uses 2 canes for ambulation  Skin:  no ulcerations or rash noted.  Psych: Good eye contact, normal affect. Memory intact not anxious or depressed appearing.     Assessment & Plan  Overweight (BMI 25.0-29.9) Wt Readings from Last 3 Encounters:  04/06/22 212 lb (96.2 kg)  01/26/22 214 lb 12.8 oz (97.4 kg)  12/05/21 217 lb (98.4 kg)   stays active working in th yard, his goal is about 200lb.  Patient encouraged to continue to stay active and engage in regular walking exercises at least 30 minutes 5 days a week as tolerated Patient counseled on low-carb diet  Essential hypertension BP Readings from Last 3 Encounters:  04/06/22 (!) 100/58  01/26/22 110/60  12/05/21 (!) 112/56  Chronic condition well-controlled on Cardizem to 40 mg daily metoprolol 100 mg daily, torsemide 40 mg daily Appreciate collaboration with cardiology Patient encouraged to continue to engage in regular daily exercises as tolerated DASH diet advised Encouraged to maintain close follow-up with cardiology  Atrial fibrillation with RVR ([place]) Chronic condition well controlled Eliquis 5 mg twice daily, Cardizem 240 mg daily, metoprolol 100 mg daily Denies any complaints today  Hyperlipidemia LDL goal <70 Lab Results  Component Value Date   CHOL 110 11/12/2021   HDL 74 11/12/2021   LDLCALC 31 11/12/2021   TRIG 23 11/12/2021   CHOLHDL 1.5 11/12/2021  Chronic condition well controlled on atorvastatin 80 mg daily Continue current medication Avoid fatty fried foods

## 2022-04-06 NOTE — Assessment & Plan Note (Signed)
Lab Results  Component Value Date   CHOL 110 11/12/2021   HDL 74 11/12/2021   LDLCALC 31 11/12/2021   TRIG 23 11/12/2021   CHOLHDL 1.5 11/12/2021  Chronic condition well controlled on atorvastatin 80 mg daily Continue current medication Avoid fatty fried foods

## 2022-04-06 NOTE — Patient Instructions (Addendum)
Please get your TDAP vaccine at the phamacy   It is important that you exercise regularly as tolerated 5 times a week.  Think about what you will eat, plan ahead. Choose " clean, green, fresh or frozen" over canned, processed or packaged foods which are more sugary, salty and fatty. 70 to 75% of food eaten should be vegetables and fruit. Three meals at set times with snacks allowed between meals, but they must be fruit or vegetables. Aim to eat over a 12 hour period , example 7 am to 7 pm, and STOP after  your last meal of the day. Drink water,generally about 64 ounces per day, no other drink is as healthy. Fruit juice is best enjoyed in a healthy way, by EATING the fruit.  Thanks for choosing [facility], we consider it a privelige to serve you.

## 2022-04-06 NOTE — Assessment & Plan Note (Addendum)
Wt Readings from Last 3 Encounters:  04/06/22 212 lb (96.2 kg)  01/26/22 214 lb 12.8 oz (97.4 kg)  12/05/21 217 lb (98.4 kg)  stays active working in th yard, his goal is about 200lb.  Patient encouraged to continue to stay active and engage in regular walking exercises at least 30 minutes 5 days a week as tolerated Patient counseled on low-carb diet

## 2022-04-06 NOTE — Assessment & Plan Note (Signed)
BP Readings from Last 3 Encounters:  04/06/22 (!) 100/58  01/26/22 110/60  12/05/21 (!) 112/56  Chronic condition well-controlled on Cardizem to 40 mg daily metoprolol 100 mg daily, torsemide 40 mg daily Appreciate collaboration with cardiology Patient encouraged to continue to engage in regular daily exercises as tolerated DASH diet advised Encouraged to maintain close follow-up with cardiology

## 2022-04-06 NOTE — Assessment & Plan Note (Signed)
Chronic condition well controlled Eliquis 5 mg twice daily, Cardizem 240 mg daily, metoprolol 100 mg daily Denies any complaints today

## 2022-04-09 NOTE — Progress Notes (Unsigned)
Cardiology Office Note  Date: 04/10/2022   ID: Leonard Cisneros, DOB Mar 04, 1941, MRN [id number]  PCP:  Renee Rival, FNP  Cardiologist:  Rozann Lesches, MD Electrophysiologist:  None   Chief Complaint  Patient presents with   Cardiac follow-up    History of Present Illness: Leonard Cisneros is an 81 y.o. male last seen in May by Ms. Barrett PA-C.  He is here for a follow-up visit.  States that he feels better with diuresis and weight loss. He has been on Demadex 40 mg twice daily as discussed in the last office note by Ms. Barrett PA-C, follow-up lab work reviewed below with creatinine 0.99 and potassium 3.6.  He reports NYHA class II dyspnea with most activities, no angina or palpitations.  No orthopnea or PND.  Leg swelling is much better.  I reviewed his echocardiogram and Myoview from March, noted below.  We will plan a follow-up echocardiogram in March of next year for reassessment of moderate aortic stenosis.  I went over his cardiac medications.  He does not report any spontaneous bleeding problems on Eliquis.  Past Medical History:  Diagnosis Date   Aortic stenosis    Atrial flutter ([place])    a. s/p remote ablation by Dr. [name].   CAD (coronary artery disease)    a. s/p CABG 2003. b. 01/2016: unstable angina - s/p BMS to SVG-ramus intermediate, patent LIMA-mLAD, patent RIMA-dRCA.    Chronic lower back pain    Essential hypertension    Hyperlipidemia    Ischemic cardiomyopathy    a. Cath 01/2016 -  EF 50-55% with mild distal inferior hypocontractility.   Peripheral neuropathy 04/04/2014   Permanent atrial fibrillation [facility])     Past Surgical History:  Procedure Laterality Date   BACK SURGERY     CARDIAC CATHETERIZATION  ~ 2000; 2003   CARDIAC CATHETERIZATION N/A 02/01/2016   Procedure: Left Heart Cath and Cors/Grafts Angiography;  Surgeon: [name], MD;  Location: [place] [facility];  Service: Cardiovascular;  Laterality: N/A;   CARDIAC  CATHETERIZATION N/A 02/01/2016   Procedure: Coronary Stent Intervention;  Surgeon: [name], MD;  Location: [place] [facility];  Service: Cardiovascular;  Laterality: N/A;   CATARACT EXTRACTION W/PHACO Left 02/09/2019   Procedure: CATARACT EXTRACTION PHACO AND INTRAOCULAR LENS PLACEMENT (IOC);  Surgeon: [name], MD;  Location: [facility];  Service: Ophthalmology;  Laterality: Left;  CDE: 27.70   CATARACT EXTRACTION W/PHACO Right 02/23/2019   Procedure: CATARACT EXTRACTION PHACO AND INTRAOCULAR LENS PLACEMENT RIGHT EYE;  Surgeon: [name], MD;  Location: [facility];  Service: Ophthalmology;  Laterality: Right;  CDE: 10.15   COLONOSCOPY  06/04/2002   Normal colon and rectum   COLONOSCOPY  07/08/2012   Procedure: COLONOSCOPY;  Surgeon: [name], MD;  Location: [facility];  Service: Endoscopy;  Laterality: N/A;  11:30   COLONOSCOPY N/A 10/02/2017   Procedure: COLONOSCOPY;  Surgeon: [name], MD;  Location: [facility];  Service: Endoscopy;  Laterality: N/A;  10:30am   CORONARY ANGIOPLASTY WITH STENT PLACEMENT  02/01/2016   CORONARY ARTERY BYPASS GRAFT  2003   LIMA to LAD,LIMA to distal RCA,SVG to ramus intermediate vessel.   FEMORAL REVISION Right 03/30/2019   Procedure: Femoral revision right total hip arthroplasty;  Surgeon: [name], MD;  Location: [facility];  Service: Orthopedics;  Laterality: Right;  156mn   FOREIGN BODY REMOVAL Right 04/02/2014   Procedure: FOREIGN BODY REMOVAL RIGHT FOOT;  Surgeon: MElta Guadeloupe  Lowella Petties, MD;  Location: [facility];  Service: General;  Laterality: Right;   JOINT REPLACEMENT     JOINT REPLACEMENT     LUMBAR [place] SURGERY     REVISION TOTAL HIP ARTHROPLASTY Bilateral 2005-2012   right-left   SHOULDER OPEN ROTATOR CUFF REPAIR Right    TOTAL HIP ARTHROPLASTY Right 1999   TOTAL HIP ARTHROPLASTY Left 2008   US ECHOCARDIOGRAPHY  11/13/2011   LV mildly dilated,mild concentric LVH,LA mod - severely dilated,RA mildly dilated,mild to mod. mitral annular  ca+,mild to mod MR,aortic root ca+ w/mild dilatation,bicuspid AOV cannot be excluded.    Current Outpatient Medications  Medication Sig Dispense Refill   apixaban (ELIQUIS) 5 MG TABS tablet Take 1 tablet (5 mg total) by mouth 2 (two) times daily. 60 tablet 11   atorvastatin (LIPITOR) 80 MG tablet TAKE 1 TABLET BY MOUTH EVERY DAY 90 tablet 3   diltiazem (DILT-XR) 240 MG 24 hr capsule TAKE 1 CAPSULE BY MOUTH EVERY DAY 90 capsule 3   metoprolol succinate (TOPROL-XL) 100 MG 24 hr tablet TAKE 1 TABLET BY MOUTH DAILY. TAKE WITH OR IMMEDIATELY FOLLOWING A MEAL. 90 tablet 1   torsemide (DEMADEX) 20 MG tablet TAKE 2 TABLETS (40 MG) DAILY. MAY TAKE ADDITIONAL 40 MG AS NEEDED FOR FLUID GAIN 270 tablet 1   nitroGLYCERIN (NITROSTAT) 0.4 MG SL tablet Place 1 tablet (0.4 mg total) under the tongue every 5 (five) minutes x 3 doses as needed for chest pain (if no relief after 3rd dose, proceed to the ED for an evaluation or call 911). 25 tablet 3   No current facility-administered medications for this visit.   Allergies:  Augmentin [amoxicillin-pot clavulanate]   ROS:  No syncope.  Physical Exam: VS:  BP 116/64   Pulse 72   Ht _0  (1.803 m)   Wt 212 lb 9.6 oz (96.4 kg)   SpO2 96%   BMI 29.65 kg/m , BMI Body mass index is 29.65 kg/m.  Wt Readings from Last 3 Encounters:  04/10/22 212 lb 9.6 oz (96.4 kg)  04/06/22 212 lb (96.2 kg)  01/26/22 214 lb 12.8 oz (97.4 kg)    General: Patient appears comfortable at rest. HEENT: Conjunctiva and lids normal. Neck: Supple, no elevated JVP or carotid bruits, no thyromegaly. Lungs: Clear to auscultation, nonlabored breathing at rest. Cardiac: Regular rate and rhythm, no S3, 2/6 systolic murmur, no pericardial rub. Extremities: Trace ankle edema.  ECG:  An ECG dated 11/11/2021 was personally reviewed today and demonstrated:  Atrial fibrillation with rightward axis and nonspecific ST changes.  Recent Labwork: 11/11/2021: B Natriuretic Peptide 152.0; TSH  1.789 11/13/2021: ALT 26; AST 25; Magnesium 2.0 12/05/2021: Hemoglobin 10.9; Platelets 267 01/26/2022: BUN 17; Creatinine, Ser 0.99; Potassium 3.6; Sodium 139     Component Value Date/Time   CHOL 110 11/12/2021 0042   CHOL 140 10/04/2021 0843   TRIG 23 11/12/2021 0042   HDL 74 11/12/2021 0042   HDL 93 10/04/2021 0843   CHOLHDL 1.5 11/12/2021 0042   VLDL 5 11/12/2021 0042   LDLCALC 31 11/12/2021 0042   LDLCALC 35 10/04/2021 0843    Other Studies Reviewed Today:  Lexiscan Myoview 11/12/2021: IMPRESSION: 1. No reversible ischemia or infarction.   2. Normal left ventricular wall motion.   3. Left ventricular ejection fraction 62%   4. Non invasive risk stratification*: Low   Echocardiogram 11/12/2021:  1. Left ventricular ejection fraction, by estimation, is 55 to 60%. The  left ventricle has normal function. The left  ventricle has no regional  wall motion abnormalities. There is mild left ventricular hypertrophy.  Diastolic function indeterminant due to   severe MAC. Elevated left atrial pressure.   2. Right ventricular systolic function is normal. The right ventricular  size is mildly enlarged. There is severely elevated pulmonary artery  systolic pressure. The estimated right ventricular systolic pressure is  95.6 mmHg.   3. Left atrial size was severely dilated.   4. Right atrial size was moderately dilated.   5. The mitral valve is abnormal. Mild mitral valve regurgitation. Severe  mitral annular calcification.   6. The aortic valve has an indeterminant number of cusps. There is  moderate calcification of the aortic valve. There is severe thickening of  the aortic valve. Aortic valve regurgitation is not visualized. Moderate  aortic valve stenosis. Aortic valve  mean gradient measures 27.0 mmHg. Aortic valve Vmax measures 3.42 m/s. AVA  by continuity 1.44cm2, DI 0.31   7. Aortic dilatation noted. There is mild dilatation of the ascending  aorta, measuring 41 mm.   8. The  inferior vena cava is dilated in size with <50% respiratory  variability, suggesting right atrial pressure of 15 mmHg.   Assessment and Plan:  1.  HFpEF, clinically stable with NYHA class II dyspnea.  LVEF 55 to 60%.  He is doing well on Demadex 40 mg twice daily.  Could consider addition of SGLT2 inhibitor ultimately, but at this point doing well.  2.  Permanent atrial fibrillation with CHA2DS2-VASc score of 4.  No palpitations with good heart rate control on Cardizem CD and Toprol-XL.  Continue Eliquis for stroke prophylaxis.  3.  Calcific aortic stenosis.  Plan follow-up echocardiogram in March of next year.  4.  Multivessel CAD status post CABG in 2003 with BMS to the SVG to ramus intermedius in 2017.  Ischemic testing from March of this year was low risk.  He does not report any active angina.  He remains on Lipitor and as needed nitroglycerin.  Medication Adjustments/Labs and Tests Ordered: Current medicines are reviewed at length with the patient today.  Concerns regarding medicines are outlined above.   Tests Ordered: Orders Placed This Encounter  Procedures   Basic metabolic panel   ECHOCARDIOGRAM COMPLETE    Medication Changes: Meds ordered this encounter  Medications   nitroGLYCERIN (NITROSTAT) 0.4 MG SL tablet    Sig: Place 1 tablet (0.4 mg total) under the tongue every 5 (five) minutes x 3 doses as needed for chest pain (if no relief after 3rd dose, proceed to the ED for an evaluation or call 911).    Dispense:  25 tablet    Refill:  3    Disposition:  Follow up  6 months.  Signed, Satira Sark, MD, [facility] 04/10/2022 10:17 AM    [facility], [place], [place] [address] Phone: [phone]; Fax: [phone]

## 2022-04-10 ENCOUNTER — Ambulatory Visit (INDEPENDENT_AMBULATORY_CARE_PROVIDER_SITE_OTHER): Payer: Medicare HMO | Admitting: Cardiology

## 2022-04-10 ENCOUNTER — Encounter: Payer: Self-pay | Admitting: Cardiology

## 2022-04-10 VITALS — BP 116/64 | HR 72 | Ht 71.0 in | Wt 212.6 lb

## 2022-04-10 DIAGNOSIS — I350 Nonrheumatic aortic (valve) stenosis: Secondary | ICD-10-CM | POA: Diagnosis not present

## 2022-04-10 DIAGNOSIS — I5032 Chronic diastolic (congestive) heart failure: Secondary | ICD-10-CM | POA: Diagnosis not present

## 2022-04-10 DIAGNOSIS — I4821 Permanent atrial fibrillation: Secondary | ICD-10-CM | POA: Diagnosis not present

## 2022-04-10 MED ORDER — NITROGLYCERIN 0.4 MG SL SUBL: 0.4000 mg | SUBLINGUAL_TABLET | SUBLINGUAL | 3 refills | Status: DC | PRN

## 2022-04-10 NOTE — Patient Instructions (Addendum)
Medication Instructions:  Your physician recommends that you continue on your current medications as directed. Please refer to the Current Medication list given to you today.   Labwork: BMET (in 6 months @ [place])  Testing/Procedures: Your physician has requested that you have an echocardiogram. Echocardiography is a painless test that uses sound waves to create images of your heart. It provides your doctor with information about the size and shape of your heart and how well your heart's chambers and valves are working. This procedure takes approximately one hour. There are no restrictions for this procedure.   Follow-Up:  Your physician recommends that you schedule a follow-up appointment in: 6 months  Any Other Special Instructions Will Be Listed Below (If Applicable).  If you need a refill on your cardiac medications before your next appointment, please call your pharmacy.

## 2022-04-23 DIAGNOSIS — D1801 Hemangioma of skin and subcutaneous tissue: Secondary | ICD-10-CM | POA: Diagnosis not present

## 2022-04-23 DIAGNOSIS — D485 Neoplasm of uncertain behavior of skin: Secondary | ICD-10-CM | POA: Diagnosis not present

## 2022-05-06 ENCOUNTER — Other Ambulatory Visit: Payer: Self-pay | Admitting: Cardiology

## 2022-05-23 ENCOUNTER — Ambulatory Visit (INDEPENDENT_AMBULATORY_CARE_PROVIDER_SITE_OTHER): Payer: Medicare HMO

## 2022-05-23 DIAGNOSIS — Z23 Encounter for immunization: Secondary | ICD-10-CM | POA: Diagnosis not present

## 2022-07-02 ENCOUNTER — Other Ambulatory Visit: Payer: Medicare HMO

## 2022-08-16 ENCOUNTER — Encounter: Payer: Self-pay | Admitting: Cardiology

## 2022-08-16 NOTE — Telephone Encounter (Signed)
error 

## 2022-08-21 ENCOUNTER — Ambulatory Visit: Payer: Medicare HMO | Admitting: Cardiology

## 2022-08-30 ENCOUNTER — Telehealth: Payer: Self-pay

## 2022-08-30 NOTE — Telephone Encounter (Signed)
FAXED [place]

## 2022-09-05 ENCOUNTER — Telehealth: Payer: Self-pay | Admitting: Cardiology

## 2022-09-05 MED ORDER — NITROGLYCERIN 0.4 MG SL SUBL: 0.4000 mg | SUBLINGUAL_TABLET | SUBLINGUAL | 3 refills | Status: DC | PRN

## 2022-09-05 MED ORDER — ISOSORBIDE MONONITRATE ER 30 MG PO TB24: 30.0000 mg | ORAL_TABLET | Freq: Every day | ORAL | 3 refills | Status: DC

## 2022-09-05 NOTE — Telephone Encounter (Signed)
I spoke with patient and he will begin Imdur 30 mg daily. He has f/u in 2 weeks, 1/15 at 3:30 pm with E.Peck,NP. We also discussed that he will call us if symptoms escalate and will then be advised to got to [facility] ED for possible cath.

## 2022-09-05 NOTE — Telephone Encounter (Signed)
Over the past 3 days Leonard Cisneros has had intermittent CP when he exerts himself, ie: walking up stairs, carrying groceries. He has NTG that is 82 years old which he hasn't needed to use. I refilled new RX.He is unable to rate pain but says pain is located to the right of his heart. Denies radiation, N/V. He reports slight SOB and slight ankle swelling over the past 2-3 weeks.Weight is unchanged at 211 lbs. He has taken NTG twice in the past 3 days with relief. He says when he sits for 15-30 minutes,pain is resolved.  He confirmed medications as listed in his profile.   I will forward to [place] for review.

## 2022-09-05 NOTE — Telephone Encounter (Signed)
Pt c/o of Chest Pain: STAT if CP now or developed within 24 hours  1. Are you having CP right now? No   2. Are you experiencing any other symptoms (ex. SOB, nausea, vomiting, sweating)? No   3. How long have you been experiencing CP? Last couple of days  4. Is your CP continuous or coming and going? Coming and going  5. Have you taken Nitroglycerin? Yes ?

## 2022-09-06 ENCOUNTER — Ambulatory Visit: Payer: Medicare HMO | Admitting: Nurse Practitioner

## 2022-09-17 ENCOUNTER — Ambulatory Visit: Payer: Medicare HMO | Attending: Nurse Practitioner | Admitting: Nurse Practitioner

## 2022-09-17 ENCOUNTER — Encounter: Payer: Self-pay | Admitting: *Deleted

## 2022-09-17 ENCOUNTER — Encounter: Payer: Self-pay | Admitting: Nurse Practitioner

## 2022-09-17 VITALS — BP 116/60 | HR 70 | Ht 71.0 in | Wt 226.4 lb

## 2022-09-17 DIAGNOSIS — I480 Paroxysmal atrial fibrillation: Secondary | ICD-10-CM | POA: Diagnosis not present

## 2022-09-17 DIAGNOSIS — I25119 Atherosclerotic heart disease of native coronary artery with unspecified angina pectoris: Secondary | ICD-10-CM | POA: Diagnosis not present

## 2022-09-17 DIAGNOSIS — I350 Nonrheumatic aortic (valve) stenosis: Secondary | ICD-10-CM | POA: Diagnosis not present

## 2022-09-17 DIAGNOSIS — R079 Chest pain, unspecified: Secondary | ICD-10-CM

## 2022-09-17 DIAGNOSIS — I4892 Unspecified atrial flutter: Secondary | ICD-10-CM | POA: Diagnosis not present

## 2022-09-17 DIAGNOSIS — I255 Ischemic cardiomyopathy: Secondary | ICD-10-CM | POA: Diagnosis not present

## 2022-09-17 DIAGNOSIS — E785 Hyperlipidemia, unspecified: Secondary | ICD-10-CM

## 2022-09-17 DIAGNOSIS — R600 Localized edema: Secondary | ICD-10-CM | POA: Diagnosis not present

## 2022-09-17 DIAGNOSIS — Z79899 Other long term (current) drug therapy: Secondary | ICD-10-CM

## 2022-09-17 DIAGNOSIS — I10 Essential (primary) hypertension: Secondary | ICD-10-CM

## 2022-09-17 DIAGNOSIS — I5032 Chronic diastolic (congestive) heart failure: Secondary | ICD-10-CM | POA: Diagnosis not present

## 2022-09-17 NOTE — Patient Instructions (Signed)
Medication Instructions:  Increase your Torsemide to '80mg'$  daily x 3 days, then back to previous dose Continue all other medications.     Labwork: BMET - order given today Please do in 1 week   Testing/Procedures: Your physician has requested that you have a lexiscan myoview. For further information please visit [email or web address]. Please follow instruction sheet, as given.   Follow-Up: Office will contact with results via phone, letter or mychart.    2 months   Any Other Special Instructions Will Be Listed Below (If Applicable). [place] [facility] info sheet given   If you need a refill on your cardiac medications before your next appointment, please call your pharmacy.

## 2022-09-17 NOTE — Progress Notes (Unsigned)
Cardiology Office Note:    Date:  09/17/2022  ID:  Leonard Cisneros, DOB 1941/06/21, MRN [id number]  PCP:  Leonard Cisneros, [place] [facility] Cardiologist:  Leonard Lesches, MD     Referring MD: Leonard Monday, FNP   CC: Chest pain and leg swelling  History of Present Illness:    Leonard Cisneros is a 82 y.o. male with a hx of the following:  A-flutter, status post ablation Permanent A-fib (on Eliquis) CAD, status post CABG in 2003 Ischemic cardiomyopathy HFpEF Hypertension Hyperlipidemia Moderate aortic stenosis  Underwent CABG in 2003 with LIMA-LAD, SVG-OM, RIMA-RCA, bare-metal stent to SVG with LIMA-OM LAD and RIMA-dRCA okay.  EF mildly reduced in 2013 at 45 to 50%, improved to normal.  Ischemic testing from March 2023 was low risk.  Echocardiogram from March 2023 revealed normal EF, indeterminate diastolic function due to severe MAC, RV systolic function was normal with RV size mildly enlarged, severely elevated pulmonary artery systolic pressure, estimated RVSP was 69.8 mmHg, mild MR, moderate calcification of aortic valve with moderate aortic valve stenosis, mean gradient measuring 27.0 mmHg, aortic dilatation noted of ascending aorta, measuring 41 mm.  Last seen by Dr. Domenic Cisneros on April 10, 2022.  Was doing well cardiac wise at the time.  Doing well on Demadex.  Dr. Domenic Cisneros recommended repeating echocardiogram in March 2024 and to consider future addition of SGLT2 inhibitor.  Was told to follow-up in 6 months.  He contacted our office approximately 2 weeks ago noting intermittent exertional chest pain.  Denied radiation of chest pain or nausea or vomiting.  Reported slight shortness of breath and slight ankle swelling over the past few weeks.  Weight was stable.  Imdur 30 mg daily was added to medication regimen.  It was recommended by Dr. Domenic Cisneros that if his symptoms improved after adding Imdur, consider repeat follow-up noninvasive ischemic evaluation,  and if his symptoms were to escalate with suggest presenting to ER for further evaluation at [facility] as most likely need of cardiac catheterization that will need to be pursued.  Was told to follow-up with APP.  Today he presents for follow-up.  He reports improvement in chest pain since starting Imdur.  Described as left-sided, no radiation, no shortness of breath.  Denies any symptoms associated with exertion, transient in duration. Denies any palpitations, syncope, presyncope, dizziness, orthopnea, PND, significant weight changes, acute bleeding, or claudication.  Does note chronic leg edema, has noticed more pronounced swelling in his legs recently.  Denies any other questions or concerns today.  Past Medical History:  Diagnosis Date   Aortic stenosis    Atrial flutter ([place])    a. s/p remote ablation by Dr. [name].   CAD (coronary artery disease)    a. s/p CABG 2003. b. 01/2016: unstable angina - s/p BMS to SVG-ramus intermediate, patent LIMA-mLAD, patent RIMA-dRCA.    Chronic lower back pain    Essential hypertension    Hyperlipidemia    Ischemic cardiomyopathy    a. Cath 01/2016 -  EF 50-55% with mild distal inferior hypocontractility.   Peripheral neuropathy 04/04/2014   Permanent atrial fibrillation [facility])     Past Surgical History:  Procedure Laterality Date   BACK SURGERY     CARDIAC CATHETERIZATION  ~ 2000; 2003   CARDIAC CATHETERIZATION N/A 02/01/2016   Procedure: Left Heart Cath and Cors/Grafts Angiography;  Surgeon: [name], MD;  Location: [place] [facility];  Service: Cardiovascular;  Laterality: N/A;   CARDIAC  CATHETERIZATION N/A 02/01/2016   Procedure: Coronary Stent Intervention;  Surgeon: [name], MD;  Location: [place] [facility];  Service: Cardiovascular;  Laterality: N/A;   CATARACT EXTRACTION W/PHACO Left 02/09/2019   Procedure: CATARACT EXTRACTION PHACO AND INTRAOCULAR LENS PLACEMENT (IOC);  Surgeon: [name], MD;  Location: [facility];   Service: Ophthalmology;  Laterality: Left;  CDE: 27.70   CATARACT EXTRACTION W/PHACO Right 02/23/2019   Procedure: CATARACT EXTRACTION PHACO AND INTRAOCULAR LENS PLACEMENT RIGHT EYE;  Surgeon: [name], MD;  Location: [facility];  Service: Ophthalmology;  Laterality: Right;  CDE: 10.15   COLONOSCOPY  06/04/2002   Normal colon and rectum   COLONOSCOPY  07/08/2012   Procedure: COLONOSCOPY;  Surgeon: [name], MD;  Location: [facility];  Service: Endoscopy;  Laterality: N/A;  11:30   COLONOSCOPY N/A 10/02/2017   Procedure: COLONOSCOPY;  Surgeon: [name], MD;  Location: [facility];  Service: Endoscopy;  Laterality: N/A;  10:30am   CORONARY ANGIOPLASTY WITH STENT PLACEMENT  02/01/2016   CORONARY ARTERY BYPASS GRAFT  2003   LIMA to LAD,LIMA to distal RCA,SVG to ramus intermediate vessel.   FEMORAL REVISION Right 03/30/2019   Procedure: Femoral revision right total hip arthroplasty;  Surgeon: [name], MD;  Location: [facility];  Service: Orthopedics;  Laterality: Right;  167mn   FOREIGN BODY REMOVAL Right 04/02/2014   Procedure: FOREIGN BODY REMOVAL RIGHT FOOT;  Surgeon: MJamesetta So MD;  Location: [facility];  Service: General;  Laterality: Right;   JOINT REPLACEMENT     JOINT REPLACEMENT     LUMBAR D[place]reySURGERY     REVISION TOTAL HIP ARTHROPLASTY Bilateral 2005-2012   right-left   SHOULDER OPEN ROTATOR CUFF REPAIR Right    TOTAL HIP ARTHROPLASTY Right 1999   TOTAL HIP ARTHROPLASTY Left 2008   [place]  11/13/2011   LV mildly dilated,mild concentric LVH,LA mod - severely dilated,RA mildly dilated,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    Current Medications: Current Meds  Medication Sig   apixaban (ELIQUIS) 5 MG TABS tablet Take 1 tablet (5 mg total) by mouth 2 (two) times daily.   atorvastatin (LIPITOR) 80 MG tablet TAKE 1 TABLET BY MOUTH EVERY DAY   diltiazem (DILT-XR) 240 MG 24 hr  capsule TAKE 1 CAPSULE BY MOUTH EVERY DAY   isosorbide mononitrate (IMDUR) 30 MG 24 hr tablet Take 1 tablet (30 mg total) by mouth daily.   metoprolol succinate (TOPROL-XL) 100 MG 24 hr tablet TAKE 1 TABLET BY MOUTH EVERY DAY WITH OR IMMEDIATELY FOLLOWING A MEAL   nitroGLYCERIN (NITROSTAT) 0.4 MG SL tablet Place 1 tablet (0.4 mg total) under the tongue every 5 (five) minutes x 3 doses as needed for chest pain (if no relief after 3rd dose, proceed to the ED for an evaluation or call 911).   torsemide (DEMADEX) 20 MG tablet TAKE 2 TABLETS (40 MG) DAILY. 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    Employer: RETIRED  Tobacco Use   Smoking status: Former    Packs/day: 0.50  Years: 2.00    Total pack years: 1.00    Types: Cigarettes    Quit date: 09/03/1966    Years since quitting: 56.0   Smokeless tobacco: Never  Vaping Use   Vaping Use: Never used  Substance and Sexual Activity   Alcohol use: Yes    Alcohol/week: 11.0 standard drinks of alcohol    Types: 7 Glasses of wine, 4 Cans of beer per week    Comment: a glass of wine with dinner/ beer on the weekend   Drug use: No   Sexual activity: Not on file  Other Topics Concern   Not on file  Social History Narrative   Lives with his wife.   Social Determinants of Health   Financial Resource Strain: Low Risk  (04/21/2018)   Overall Financial Resource Strain (CARDIA)    Difficulty of Paying Living Expenses: Not hard at all  Food Insecurity: No Food Insecurity (04/21/2018)   Hunger Vital Sign    Worried About Running Out of Food in the Last Year: Never true    Ran Out of Food in the Last Year: Never true   Transportation Needs: No Transportation Needs (04/21/2018)   PRAPARE - Hydrologist (Medical): No    Lack of Transportation (Non-Medical): No  Physical Activity: Not on file  Stress: Stress Concern Present (04/21/2018)   [place]    Feeling of Stress : To some extent  Social Connections: Unknown (04/21/2018)   Social Connection and Isolation Panel [NHANES]    Frequency of Communication with Friends and Family: More than three times a week    Frequency of Social Gatherings with Friends and Family: Three times a week    Attends Religious Services: Patient refused    Active Member of Clubs or Organizations: Patient refused    Attends Archivist Meetings: Patient refused    Marital Status: Married     Family History: The patient's family history includes Heart attack in his brother. There is no history of Colon cancer.  ROS:   Review of Systems  Constitutional: Negative.   HENT: Negative.    Eyes: Negative.   Respiratory: Negative.    Cardiovascular:  Positive for chest pain and leg swelling. Negative for palpitations, orthopnea, claudication and PND.  Gastrointestinal: Negative.   Genitourinary: Negative.   Musculoskeletal: Negative.   Skin: Negative.   Neurological: Negative.   Endo/Heme/Allergies: Negative.   Psychiatric/Behavioral: Negative.      Please see the history of present illness.    All other systems reviewed and are negative.  EKGs/Labs/Other Studies Reviewed:    The following studies were reviewed today:   EKG:  EKG is ordered today.  The ekg ordered today demonstrates atrial fibrillation, 62 bpm, with minimal LVH and ST/T wave abnormality for what appears to be a repolarization abnormality.   Myoview on November 12, 2021: IMPRESSION: 1. No reversible ischemia or infarction.   2. Normal left ventricular wall motion.   3. Left ventricular ejection  fraction 62%   4. Non invasive risk stratification*: Low  Echocardiogram on November 12, 2021:  1. Left ventricular ejection fraction, by estimation, is 55 to 60%. The  left ventricle has normal function. The left ventricle has no regional  wall motion abnormalities. There is mild left ventricular hypertrophy.  Diastolic function indeterminant due to   severe MAC. Elevated left atrial pressure.   2. Right ventricular systolic function is normal. The right ventricular  size is mildly enlarged. There is severely elevated pulmonary artery  systolic pressure. The estimated right ventricular systolic pressure is  88.4 mmHg.   3. Left atrial size was severely dilated.   4. Right atrial size was moderately dilated.   5. The mitral valve is abnormal. Mild mitral valve regurgitation. Severe  mitral annular calcification.   6. The aortic valve has an indeterminant number of cusps. There is  moderate calcification of the aortic valve. There is severe thickening of  the aortic valve. Aortic valve regurgitation is not visualized. Moderate  aortic valve stenosis. Aortic valve  mean gradient measures 27.0 mmHg. Aortic valve Vmax measures 3.42 m/s. AVA  by continuity 1.44cm2, DI 0.31   7. Aortic dilatation noted. There is mild dilatation of the ascending  aorta, measuring 41 mm.   8. The inferior vena cava is dilated in size with <50% respiratory  variability, suggesting right atrial pressure of 15 mmHg.   Comparison(s): Compared to prior TTE 03/2021, there continues to be  moderate AS (previous mean graident 11mHg; now 271mg). There is now  severe pulmonary HTN (previously not reported).   ABIs on April 14, 2019: Summary:  Right: Resting right ankle-brachial index indicates noncompressible right  lower extremity arteries.The right toe-brachial index is abnormal. ABIs  are unreliable.   Left: Resting left ankle-brachial index indicates noncompressible left  lower extremity arteries.The left  toe-brachial index is abnormal. ABIs are  unreliable.  Coronary stent intervention on Feb 01, 2016: Ost LAD lesion, 100% stenosed. Ost Ramus lesion, 100% stenosed. Prox RCA lesion, 70% stenosed. Mid RCA lesion, 50% stenosed. RIMA . LIMA . Prox LAD to Mid LAD lesion, 75% stenosed. SVG . Origin lesion, 99% stenosed. Post intervention, there is a 0% residual stenosis. The left ventricular systolic function is normal.   Low normal LV function with a subtle region of distal inferior hypocontractility.   Significant 2 vessel CAD with total occlusion of the LAD and ramus intermediate at its origin; normal left circumflex coronary artery; and RCA with 70% proximal stenosis with diffuse 50% mid stenoses with a " flush and fill" phenomena seen in the distal RCA due to competitive filling the the RIMA graft.   Patent LIMA graft supplying the mid LAD.   Patent RIMA graft supplying the distal RCA.   SVG supplying the ramus intermediate vessel with a 99% near ostial subtotal stenosis with thrombus.   Successful PCI to the SVG with ultimate insertion of a 2.512 mm NirXcell bare metal stent postdilated to 2.64 mm with a 99% stenosis being reduced to 0% resumption of brisk TIMI-3 flow.   RECOMMENDATION: The patient will be on triple therapy with aspirin/Plavix/Coumadin for a month.  Coumadin will be restarted tonight and after 1 month he will will be transitioned to dual therapy.    Recent Labs: 11/11/2021: B Natriuretic Peptide 152.0; TSH 1.789 11/13/2021: ALT 26; Magnesium 2.0 12/05/2021: Hemoglobin 10.9; Platelets 267 01/26/2022: BUN 17; Creatinine, Ser 0.99; Potassium 3.6; Sodium 139  Recent Lipid Panel    Component Value Date/Time   CHOL 110 11/12/2021 0042   CHOL 140 10/04/2021 0843   TRIG 23 11/12/2021 0042   HDL 74 11/12/2021 0042   HDL 93 10/04/2021 0843   CHOLHDL 1.5 11/12/2021 0042   VLDL 5 11/12/2021 0042   LDLCALC 31 11/12/2021 0042   LDLCALC 35 10/04/2021 0843     Risk  Assessment/Calculations:    CHA2DS2-VASc Score = 4  This indicates a 4.8% annual risk of stroke. The patient's score  is based upon: CHF History: 1 HTN History: 1 Diabetes History: 0 Stroke History: 0 Vascular Disease History: 0 Age Score: 2 Gender Score: 0   Physical Exam:    VS:  BP 116/60   Pulse 70   Ht _0  (1.803 m)   Wt 226 lb 6.4 oz (102.7 kg)   SpO2 93%   BMI 31.58 kg/m     Wt Readings from Last 3 Encounters:  09/17/22 226 lb 6.4 oz (102.7 kg)  04/10/22 212 lb 9.6 oz (96.4 kg)  04/06/22 212 lb (96.2 kg)     GEN: Well-nourished, well-developed 82 year old male in no acute distress HEENT: Normal NECK: No JVD; No carotid bruits CARDIAC: S1/S2, irregular rhythm and regular rate, Grade 3/6 systolic murmur, no rubs or gallops; 2+ radial pulses, 1+ PT RESPIRATORY:  Clear to auscultation without rales, wheezing or rhonchi  MUSCULOSKELETAL: Nonpitting, bilateral lower extremity edema; No deformity  SKIN: Peeling/dry skin along bilateral lower extremities with skin discoloration and scab noted along left lower leg, warm and dry NEUROLOGIC:  Alert and oriented x 3 PSYCHIATRIC:  Normal affect   ASSESSMENT:    1. Coronary artery disease involving native heart with angina pectoris, unspecified vessel or lesion type (HCC)   2. Chest pain, unspecified type   3. Leg edema   4. Medication management   5. Hypertension, unspecified type   6. PAF (paroxysmal atrial fibrillation) ([place])   7. Atrial flutter, unspecified type ([place])   8. Chronic heart failure with preserved ejection fraction (HCC)   9. Ischemic cardiomyopathy   10. Hyperlipidemia, unspecified hyperlipidemia type   11. Moderate aortic stenosis    PLAN:    In order of problems listed above:  CAD, status post CABG in 2003, Chest pain of uncertain etiology Improved symptoms since starting Imdur.  Was previously recommended by Dr. Domenic Cisneros to consider updating stress test if medication therapy improved symptoms.   Last NST 1 year ago revealed normal findings.  Discussed updating NST with patient including risk and benefits of Lexiscan, and he is agreeable to proceed.  Continue Lipitor, Imdur, Toprol-XL, and nitroglycerin. Heart healthy diet and regular cardiovascular exercise encouraged.  ED precautions discussed.  Shared Decision Making/Informed Consent The risks [chest pain, shortness of breath, cardiac arrhythmias, dizziness, blood pressure fluctuations, myocardial infarction, stroke/transient ischemic attack, nausea, vomiting, allergic reaction, radiation exposure, metallic taste sensation and life-threatening complications (estimated to be 1 in 10,000)], benefits (risk stratification, diagnosing coronary artery disease, treatment guidance) and alternatives of a nuclear stress test were discussed in detail with Mr. Greenough and he agrees to proceed.   2. Leg edema, medication management Chronic, worsening per patient's report. Nonpitting on exam. Will double dose of Torsemide x 3 days, then reduce to normal. Will refill Torsemide. Recheck BMET in 1 week. Discussed compression stockings, low salt/heart healthy diet, and leg elevation. He verbalized understanding. Will update Echo 11/2022 - see below.   3. HTN Blood pressure today stable.  BP well-controlled at home.  Continue current medication regimen. Discussed to monitor BP at home at least 2 hours after medications and sitting for 5-10 minutes. Heart healthy diet and regular cardiovascular exercise encouraged.   4. PAF/ A-flutter, s/p ablation Denies any tachycardia or palpitations.  EKG shows A-fib with rate controlled today.  Denies any bleeding issues on Eliquis.  He is on appropriate dosing of Eliquis.  Continue Eliquis 5 mg twice daily.  Continue Toprol-XL and diltiazem. Heart healthy diet and regular cardiovascular exercise encouraged.    5. HFpEF,  ICM Echo March 2023 revealed preserved EF, mild LVH, no RWMA, indeterminate diastolic function.  Does  show evidence of leg edema on exam, however rest of exam appears euvolemic and well compensated.  Increasing torsemide as mentioned above.  Will repeat BMET in 1 week.  Updating echocardiogram March 2024 -see below. Low sodium diet, fluid restriction <2L, and daily weights encouraged. Educated to contact our office for weight gain of 2 lbs overnight or 5 lbs in one week. Consider initiating spironolactone at next OV. Heart healthy diet and regular cardiovascular exercise encouraged.   7. HLD Lipid panel March 2023 was normal.  Continue current medication regimen. Heart healthy diet and regular cardiovascular exercise encouraged.   8. Moderate AS Echocardiogram in March 2023 revealed moderate aortic stenosis with mean gradient measuring 27.0 mmHg.  Does have grade 3/6 systolic murmur on exam.  He is asymptomatic and denies any concerning symptoms.  Order has been placed for repeat echocardiogram in March 2024.  9. Disposition: Follow-up with me in 2 months or sooner if anything changes.   Medication Adjustments/Labs and Tests Ordered: Current medicines are reviewed at length with the patient today.  Concerns regarding medicines are outlined above.  Orders Placed This Encounter  Procedures   NM Myocar Multi W/Spect W/Wall Motion / EF   Basic metabolic panel   No orders of the defined types were placed in this encounter.   Patient Instructions  Medication Instructions:  Increase your Torsemide to 74m daily x 3 days, then back to previous dose Continue all other medications.     Labwork: BMET - order given today Please do in 1 week   Testing/Procedures: Your physician has requested that you have a lexiscan myoview. For further information please visit [email or web address] Please follow instruction sheet, as given.   Follow-Up: Office will contact with results via phone, letter or mychart.    2 months   Any Other Special Instructions Will Be Listed Below (If Applicable). [place]  info sheet given   If you need a refill on your cardiac medications before your next appointment, please call your pharmacy.    Signed,Leonard Bud NP  09/18/2022 9:19 AM    [place]

## 2022-09-18 NOTE — Addendum Note (Signed)
Addended by: Jacqulynn Cadet on: 09/18/2022 02:33 PM   Modules accepted: Orders

## 2022-09-24 ENCOUNTER — Other Ambulatory Visit: Payer: Self-pay

## 2022-09-24 ENCOUNTER — Inpatient Hospital Stay (HOSPITAL_COMMUNITY): Payer: Medicare HMO

## 2022-09-24 ENCOUNTER — Encounter (HOSPITAL_COMMUNITY): Payer: Self-pay

## 2022-09-24 ENCOUNTER — Emergency Department (HOSPITAL_COMMUNITY): Payer: Medicare HMO

## 2022-09-24 ENCOUNTER — Inpatient Hospital Stay (HOSPITAL_COMMUNITY)
Admission: EM | Admit: 2022-09-24 | Discharge: 2022-10-01 | DRG: 286 | Disposition: A | Discharge status: Home Health | Payer: Medicare HMO | Attending: Internal Medicine | Admitting: Internal Medicine

## 2022-09-24 DIAGNOSIS — D6869 Other thrombophilia: Secondary | ICD-10-CM | POA: Diagnosis not present

## 2022-09-24 DIAGNOSIS — E8889 Other specified metabolic disorders: Secondary | ICD-10-CM | POA: Diagnosis not present

## 2022-09-24 DIAGNOSIS — Z79899 Other long term (current) drug therapy: Secondary | ICD-10-CM

## 2022-09-24 DIAGNOSIS — E8770 Fluid overload, unspecified: Secondary | ICD-10-CM | POA: Diagnosis present

## 2022-09-24 DIAGNOSIS — D649 Anemia, unspecified: Secondary | ICD-10-CM | POA: Diagnosis present

## 2022-09-24 DIAGNOSIS — J811 Chronic pulmonary edema: Secondary | ICD-10-CM | POA: Diagnosis not present

## 2022-09-24 DIAGNOSIS — I739 Peripheral vascular disease, unspecified: Secondary | ICD-10-CM | POA: Diagnosis not present

## 2022-09-24 DIAGNOSIS — D539 Nutritional anemia, unspecified: Secondary | ICD-10-CM | POA: Diagnosis not present

## 2022-09-24 DIAGNOSIS — I272 Pulmonary hypertension, unspecified: Secondary | ICD-10-CM | POA: Diagnosis present

## 2022-09-24 DIAGNOSIS — I5033 Acute on chronic diastolic (congestive) heart failure: Secondary | ICD-10-CM | POA: Diagnosis not present

## 2022-09-24 DIAGNOSIS — D6859 Other primary thrombophilia: Secondary | ICD-10-CM | POA: Diagnosis present

## 2022-09-24 DIAGNOSIS — R2681 Unsteadiness on feet: Secondary | ICD-10-CM | POA: Diagnosis present

## 2022-09-24 DIAGNOSIS — I255 Ischemic cardiomyopathy: Secondary | ICD-10-CM | POA: Diagnosis not present

## 2022-09-24 DIAGNOSIS — Z955 Presence of coronary angioplasty implant and graft: Secondary | ICD-10-CM | POA: Diagnosis not present

## 2022-09-24 DIAGNOSIS — E785 Hyperlipidemia, unspecified: Secondary | ICD-10-CM | POA: Diagnosis not present

## 2022-09-24 DIAGNOSIS — I2089 Other forms of angina pectoris: Secondary | ICD-10-CM | POA: Diagnosis present

## 2022-09-24 DIAGNOSIS — J9601 Acute respiratory failure with hypoxia: Secondary | ICD-10-CM | POA: Diagnosis not present

## 2022-09-24 DIAGNOSIS — Z951 Presence of aortocoronary bypass graft: Secondary | ICD-10-CM

## 2022-09-24 DIAGNOSIS — F1020 Alcohol dependence, uncomplicated: Secondary | ICD-10-CM | POA: Diagnosis present

## 2022-09-24 DIAGNOSIS — J90 Pleural effusion, not elsewhere classified: Secondary | ICD-10-CM | POA: Diagnosis not present

## 2022-09-24 DIAGNOSIS — R0602 Shortness of breath: Secondary | ICD-10-CM | POA: Diagnosis not present

## 2022-09-24 DIAGNOSIS — Z7901 Long term (current) use of anticoagulants: Secondary | ICD-10-CM

## 2022-09-24 DIAGNOSIS — I5020 Unspecified systolic (congestive) heart failure: Principal | ICD-10-CM

## 2022-09-24 DIAGNOSIS — Z87891 Personal history of nicotine dependence: Secondary | ICD-10-CM

## 2022-09-24 DIAGNOSIS — Z8249 Family history of ischemic heart disease and other diseases of the circulatory system: Secondary | ICD-10-CM | POA: Diagnosis not present

## 2022-09-24 DIAGNOSIS — R5381 Other malaise: Secondary | ICD-10-CM | POA: Diagnosis present

## 2022-09-24 DIAGNOSIS — I2510 Atherosclerotic heart disease of native coronary artery without angina pectoris: Secondary | ICD-10-CM | POA: Diagnosis present

## 2022-09-24 DIAGNOSIS — R7309 Other abnormal glucose: Secondary | ICD-10-CM | POA: Diagnosis present

## 2022-09-24 DIAGNOSIS — D509 Iron deficiency anemia, unspecified: Secondary | ICD-10-CM | POA: Diagnosis present

## 2022-09-24 DIAGNOSIS — I4821 Permanent atrial fibrillation: Secondary | ICD-10-CM | POA: Diagnosis present

## 2022-09-24 DIAGNOSIS — I2489 Other forms of acute ischemic heart disease: Secondary | ICD-10-CM | POA: Diagnosis present

## 2022-09-24 DIAGNOSIS — E876 Hypokalemia: Secondary | ICD-10-CM | POA: Diagnosis present

## 2022-09-24 DIAGNOSIS — Z6830 Body mass index (BMI) 30.0-30.9, adult: Secondary | ICD-10-CM

## 2022-09-24 DIAGNOSIS — R54 Age-related physical debility: Secondary | ICD-10-CM | POA: Diagnosis present

## 2022-09-24 DIAGNOSIS — E669 Obesity, unspecified: Secondary | ICD-10-CM | POA: Diagnosis present

## 2022-09-24 DIAGNOSIS — M792 Neuralgia and neuritis, unspecified: Secondary | ICD-10-CM | POA: Diagnosis present

## 2022-09-24 DIAGNOSIS — R062 Wheezing: Secondary | ICD-10-CM | POA: Diagnosis not present

## 2022-09-24 DIAGNOSIS — K760 Fatty (change of) liver, not elsewhere classified: Secondary | ICD-10-CM | POA: Diagnosis not present

## 2022-09-24 DIAGNOSIS — I350 Nonrheumatic aortic (valve) stenosis: Secondary | ICD-10-CM | POA: Diagnosis present

## 2022-09-24 DIAGNOSIS — E7489 Other specified disorders of carbohydrate metabolism: Secondary | ICD-10-CM | POA: Diagnosis present

## 2022-09-24 DIAGNOSIS — D638 Anemia in other chronic diseases classified elsewhere: Secondary | ICD-10-CM | POA: Diagnosis present

## 2022-09-24 DIAGNOSIS — F1010 Alcohol abuse, uncomplicated: Secondary | ICD-10-CM | POA: Diagnosis present

## 2022-09-24 DIAGNOSIS — R748 Abnormal levels of other serum enzymes: Secondary | ICD-10-CM | POA: Diagnosis present

## 2022-09-24 DIAGNOSIS — I200 Unstable angina: Secondary | ICD-10-CM | POA: Diagnosis not present

## 2022-09-24 DIAGNOSIS — I110 Hypertensive heart disease with heart failure: Secondary | ICD-10-CM | POA: Diagnosis not present

## 2022-09-24 DIAGNOSIS — I509 Heart failure, unspecified: Secondary | ICD-10-CM | POA: Diagnosis not present

## 2022-09-24 DIAGNOSIS — I5031 Acute diastolic (congestive) heart failure: Secondary | ICD-10-CM

## 2022-09-24 DIAGNOSIS — I10 Essential (primary) hypertension: Secondary | ICD-10-CM | POA: Diagnosis present

## 2022-09-24 DIAGNOSIS — I25110 Atherosclerotic heart disease of native coronary artery with unstable angina pectoris: Secondary | ICD-10-CM | POA: Diagnosis not present

## 2022-09-24 DIAGNOSIS — I25810 Atherosclerosis of coronary artery bypass graft(s) without angina pectoris: Secondary | ICD-10-CM | POA: Diagnosis present

## 2022-09-24 DIAGNOSIS — I878 Other specified disorders of veins: Secondary | ICD-10-CM | POA: Diagnosis present

## 2022-09-24 DIAGNOSIS — Z96643 Presence of artificial hip joint, bilateral: Secondary | ICD-10-CM | POA: Diagnosis present

## 2022-09-24 DIAGNOSIS — R69 Illness, unspecified: Secondary | ICD-10-CM | POA: Diagnosis not present

## 2022-09-24 DIAGNOSIS — I4819 Other persistent atrial fibrillation: Secondary | ICD-10-CM | POA: Diagnosis present

## 2022-09-24 DIAGNOSIS — E871 Hypo-osmolality and hyponatremia: Secondary | ICD-10-CM | POA: Diagnosis present

## 2022-09-24 DIAGNOSIS — K8020 Calculus of gallbladder without cholecystitis without obstruction: Secondary | ICD-10-CM | POA: Diagnosis not present

## 2022-09-24 DIAGNOSIS — I4891 Unspecified atrial fibrillation: Secondary | ICD-10-CM | POA: Diagnosis present

## 2022-09-24 LAB — BRAIN NATRIURETIC PEPTIDE: B Natriuretic Peptide: 288 pg/mL — ABNORMAL HIGH (ref 0.0–100.0)

## 2022-09-24 LAB — HEPATIC FUNCTION PANEL
ALT: 29 U/L (ref 0–44)
AST: 33 U/L (ref 15–41)
Albumin: 3.4 g/dL — ABNORMAL LOW (ref 3.5–5.0)
Alkaline Phosphatase: 266 U/L — ABNORMAL HIGH (ref 38–126)
Bilirubin, Direct: 1.1 mg/dL — ABNORMAL HIGH (ref 0.0–0.2)
Indirect Bilirubin: 2.5 mg/dL — ABNORMAL HIGH (ref 0.3–0.9)
Total Bilirubin: 3.6 mg/dL — ABNORMAL HIGH (ref 0.3–1.2)
Total Protein: 6.4 g/dL — ABNORMAL LOW (ref 6.5–8.1)

## 2022-09-24 LAB — RETICULOCYTES
Immature Retic Fract: 23.4 % — ABNORMAL HIGH (ref 2.3–15.9)
RBC.: 2.02 MIL/uL — ABNORMAL LOW (ref 4.22–5.81)
Retic Count, Absolute: 57.6 10*3/uL (ref 19.0–186.0)
Retic Ct Pct: 2.9 % (ref 0.4–3.1)

## 2022-09-24 LAB — BASIC METABOLIC PANEL
Anion gap: 11 (ref 5–15)
BUN: 15 mg/dL (ref 8–23)
CO2: 26 mmol/L (ref 22–32)
Calcium: 8.4 mg/dL — ABNORMAL LOW (ref 8.9–10.3)
Chloride: 94 mmol/L — ABNORMAL LOW (ref 98–111)
Creatinine, Ser: 0.85 mg/dL (ref 0.61–1.24)
GFR, Estimated: >60 mL/min (ref >60)
Glucose, Bld: 120 mg/dL — ABNORMAL HIGH (ref 70–99)
Potassium: 2.9 mmol/L — ABNORMAL LOW (ref 3.5–5.1)
Sodium: 131 mmol/L — ABNORMAL LOW (ref 135–145)

## 2022-09-24 LAB — CBC
HCT: 24.7 % — ABNORMAL LOW (ref 39.0–52.0)
Hemoglobin: 8.1 g/dL — ABNORMAL LOW (ref 13.0–17.0)
MCH: 37.7 pg — ABNORMAL HIGH (ref 26.0–34.0)
MCHC: 32.8 g/dL (ref 30.0–36.0)
MCV: 114.9 fL — ABNORMAL HIGH (ref 80.0–100.0)
Platelets: 206 10*3/uL (ref 150–400)
RBC: 2.15 MIL/uL — ABNORMAL LOW (ref 4.22–5.81)
RDW: 20.1 % — ABNORMAL HIGH (ref 11.5–15.5)
WBC: 6.9 10*3/uL (ref 4.0–10.5)
nRBC: 0.4 % — ABNORMAL HIGH (ref 0.0–0.2)

## 2022-09-24 LAB — IRON AND TIBC
Iron: 147 ug/dL (ref 45–182)
Saturation Ratios: 56 % — ABNORMAL HIGH (ref 17.9–39.5)
TIBC: 262 ug/dL (ref 250–450)
UIBC: 115 ug/dL

## 2022-09-24 LAB — VITAMIN B12: Vitamin B-12: 553 pg/mL (ref 180–914)

## 2022-09-24 LAB — TROPONIN I (HIGH SENSITIVITY)
Troponin I (High Sensitivity): 18 ng/L — ABNORMAL HIGH (ref <18)
Troponin I (High Sensitivity): 18 ng/L — ABNORMAL HIGH (ref <18)

## 2022-09-24 LAB — FOLATE: Folate: 8.6 ng/mL (ref >5.9)

## 2022-09-24 LAB — MAGNESIUM: Magnesium: 1.9 mg/dL (ref 1.7–2.4)

## 2022-09-24 LAB — FERRITIN: Ferritin: 319 ng/mL (ref 24–336)

## 2022-09-24 MED ORDER — ACETAMINOPHEN 650 MG RE SUPP: 650.0000 mg | Freq: Four times a day (QID) | RECTAL | Status: DC | PRN

## 2022-09-24 MED ORDER — POTASSIUM CHLORIDE 10 MEQ/100ML IV SOLN
10.0000 meq | INTRAVENOUS | Status: AC
  Administered 2022-09-24 (×3): 10 meq via INTRAVENOUS
  Filled 2022-09-24 (×2): qty 100

## 2022-09-24 MED ORDER — IPRATROPIUM-ALBUTEROL 0.5-2.5 (3) MG/3ML IN SOLN
3.0000 mL | RESPIRATORY_TRACT | Status: DC | PRN
  Administered 2022-09-24 – 2022-09-25 (×2): 3 mL via RESPIRATORY_TRACT
  Filled 2022-09-24 (×2): qty 3

## 2022-09-24 MED ORDER — ONDANSETRON HCL 4 MG/2ML IJ SOLN: 4.0000 mg | Freq: Four times a day (QID) | INTRAMUSCULAR | Status: DC | PRN

## 2022-09-24 MED ORDER — POTASSIUM CHLORIDE CRYS ER 20 MEQ PO TBCR
30.0000 meq | EXTENDED_RELEASE_TABLET | Freq: Two times a day (BID) | ORAL | Status: DC
  Administered 2022-09-24 – 2022-09-28 (×8): 30 meq via ORAL
  Filled 2022-09-24 (×8): qty 1

## 2022-09-24 MED ORDER — POTASSIUM CHLORIDE 10 MEQ/100ML IV SOLN: 10.0000 meq | Freq: Once | INTRAVENOUS | Status: DC

## 2022-09-24 MED ORDER — POTASSIUM CHLORIDE CRYS ER 20 MEQ PO TBCR
40.0000 meq | EXTENDED_RELEASE_TABLET | Freq: Once | ORAL | Status: AC
  Administered 2022-09-24: 40 meq via ORAL
  Filled 2022-09-24: qty 2

## 2022-09-24 MED ORDER — DILTIAZEM HCL ER COATED BEADS 120 MG PO CP24
240.0000 mg | ORAL_CAPSULE | Freq: Every day | ORAL | Status: DC
  Administered 2022-09-25 – 2022-10-01 (×7): 240 mg via ORAL
  Filled 2022-09-24 (×9): qty 2

## 2022-09-24 MED ORDER — METOPROLOL SUCCINATE ER 100 MG PO TB24
100.0000 mg | ORAL_TABLET | Freq: Every day | ORAL | Status: DC
  Administered 2022-09-25 – 2022-10-01 (×7): 100 mg via ORAL
  Filled 2022-09-24 (×2): qty 2
  Filled 2022-09-24: qty 1
  Filled 2022-09-24: qty 2
  Filled 2022-09-24 (×3): qty 1

## 2022-09-24 MED ORDER — ATORVASTATIN CALCIUM 80 MG PO TABS
80.0000 mg | ORAL_TABLET | Freq: Every evening | ORAL | Status: DC
  Administered 2022-09-24 – 2022-09-30 (×7): 80 mg via ORAL
  Filled 2022-09-24: qty 2
  Filled 2022-09-24: qty 1
  Filled 2022-09-24: qty 2
  Filled 2022-09-24: qty 1
  Filled 2022-09-24: qty 2
  Filled 2022-09-24 (×2): qty 1

## 2022-09-24 MED ORDER — NITROGLYCERIN 0.4 MG SL SUBL: 0.4000 mg | SUBLINGUAL_TABLET | SUBLINGUAL | Status: DC | PRN

## 2022-09-24 MED ORDER — FUROSEMIDE 10 MG/ML IJ SOLN
40.0000 mg | Freq: Once | INTRAMUSCULAR | Status: AC
  Administered 2022-09-24: 40 mg via INTRAVENOUS
  Filled 2022-09-24: qty 4

## 2022-09-24 MED ORDER — ONDANSETRON HCL 4 MG PO TABS: 4.0000 mg | ORAL_TABLET | Freq: Four times a day (QID) | ORAL | Status: DC | PRN

## 2022-09-24 MED ORDER — ACETAMINOPHEN 325 MG PO TABS
650.0000 mg | ORAL_TABLET | Freq: Four times a day (QID) | ORAL | Status: DC | PRN
  Administered 2022-09-24: 650 mg via ORAL
  Filled 2022-09-24: qty 2

## 2022-09-24 MED ORDER — FENTANYL CITRATE PF 50 MCG/ML IJ SOSY: 12.5000 ug | PREFILLED_SYRINGE | INTRAMUSCULAR | Status: DC | PRN

## 2022-09-24 MED ORDER — FUROSEMIDE 10 MG/ML IJ SOLN
40.0000 mg | Freq: Two times a day (BID) | INTRAMUSCULAR | Status: AC
  Administered 2022-09-24 – 2022-09-26 (×5): 40 mg via INTRAVENOUS
  Filled 2022-09-24 (×5): qty 4

## 2022-09-24 MED ORDER — BISACODYL 5 MG PO TBEC: 5.0000 mg | DELAYED_RELEASE_TABLET | Freq: Every day | ORAL | Status: DC | PRN

## 2022-09-24 MED ORDER — ISOSORBIDE MONONITRATE ER 30 MG PO TB24
30.0000 mg | ORAL_TABLET | Freq: Every day | ORAL | Status: DC
  Administered 2022-09-25 – 2022-09-28 (×4): 30 mg via ORAL
  Filled 2022-09-24 (×5): qty 1

## 2022-09-24 MED ORDER — APIXABAN 5 MG PO TABS
5.0000 mg | ORAL_TABLET | Freq: Two times a day (BID) | ORAL | Status: DC
  Administered 2022-09-24 – 2022-09-26 (×4): 5 mg via ORAL
  Filled 2022-09-24 (×4): qty 1

## 2022-09-24 MED ORDER — PANTOPRAZOLE SODIUM 40 MG IV SOLR
40.0000 mg | Freq: Once | INTRAVENOUS | Status: AC
  Administered 2022-09-24: 40 mg via INTRAVENOUS
  Filled 2022-09-24: qty 10

## 2022-09-24 MED ORDER — OXYCODONE HCL 5 MG PO TABS: 5.0000 mg | ORAL_TABLET | ORAL | Status: DC | PRN

## 2022-09-24 MED ORDER — TRAZODONE HCL 50 MG PO TABS
25.0000 mg | ORAL_TABLET | Freq: Every evening | ORAL | Status: DC | PRN
  Administered 2022-09-24 – 2022-09-30 (×5): 25 mg via ORAL
  Filled 2022-09-24 (×6): qty 1

## 2022-09-24 NOTE — Progress Notes (Signed)
Pt arrived to unit 300 via wheel chair by staff. Helped pt ambulate to bed. Pt experienced dyspnea with exertion and expiratory wheezes. Bed in lowest position with call light within reach Will continue to monitor.

## 2022-09-24 NOTE — ED Provider Notes (Addendum)
[facility] Provider Note   CSN: [id number] Arrival date & time: 09/24/22  6599     History  Chief Complaint  Patient presents with   Shortness of Breath    Leonard Cisneros is a 82 y.o. male.  Patient has a history of coronary artery cardiomyopathy and atrial fibs.  He is on Eliquis.  He complains of swelling in his legs.  His cardiologist has increased his diuretic but he still has significant edema  The history is provided by the patient and medical records. No language interpreter was used.  Shortness of Breath Severity:  Moderate Onset quality:  Sudden Timing:  Constant Progression:  Worsening Chronicity:  Recurrent Context: activity   Relieved by:  Nothing Worsened by:  Nothing Ineffective treatments:  None tried Associated symptoms: no abdominal pain, no chest pain, no cough, no headaches and no rash        Home Medications Prior to Admission medications   Medication Sig Start Date End Date Taking? Authorizing Provider  apixaban (ELIQUIS) 5 MG TABS tablet Take 1 tablet (5 mg total) by mouth 2 (two) times daily. 12/11/21 04/11/23 Yes Satira Sark, MD  atorvastatin (LIPITOR) 80 MG tablet TAKE 1 TABLET BY MOUTH EVERY DAY 12/07/21  Yes Satira Sark, MD  diltiazem (DILT-XR) 240 MG 24 hr capsule TAKE 1 CAPSULE BY MOUTH EVERY DAY 12/07/21  Yes Satira Sark, MD  isosorbide mononitrate (IMDUR) 30 MG 24 hr tablet Take 1 tablet (30 mg total) by mouth daily. 09/05/22 08/31/23 Yes Satira Sark, MD  metoprolol succinate (TOPROL-XL) 100 MG 24 hr tablet TAKE 1 TABLET BY MOUTH EVERY DAY WITH OR IMMEDIATELY FOLLOWING A MEAL 05/08/22  Yes Satira Sark, MD  nitroGLYCERIN (NITROSTAT) 0.4 MG SL tablet Place 1 tablet (0.4 mg total) under the tongue every 5 (five) minutes x 3 doses as needed for chest pain (if no relief after 3rd dose, proceed to the ED for an evaluation or call 911). 09/05/22  Yes Satira Sark, MD   torsemide (DEMADEX) 20 MG tablet TAKE 2 TABLETS (40 MG) DAILY. MAY TAKE ADDITIONAL 40 MG AS NEEDED FOR FLUID GAIN 02/16/22  Yes Satira Sark, MD      Allergies    Augmentin [amoxicillin-pot clavulanate]    Review of Systems   Review of Systems  Constitutional:  Negative for appetite change and fatigue.  HENT:  Negative for congestion, ear discharge and sinus pressure.   Eyes:  Negative for discharge.  Respiratory:  Positive for shortness of breath. Negative for cough.   Cardiovascular:  Negative for chest pain.  Gastrointestinal:  Negative for abdominal pain and diarrhea.  Genitourinary:  Negative for frequency and hematuria.  Musculoskeletal:  Negative for back pain.       Worsening leg swelling  Skin:  Negative for rash.  Neurological:  Negative for seizures and headaches.  Psychiatric/Behavioral:  Negative for hallucinations.     Physical Exam Updated Vital Signs BP (!) 117/58   Pulse 75   Temp 97.7 F (36.5 C) (Oral)   Resp 18   Wt 102.5 kg   SpO2 98%   BMI 31.52 kg/m  Physical Exam Vitals and nursing note reviewed.  Constitutional:      Appearance: He is well-developed.  HENT:     Head: Normocephalic.     Nose: Nose normal.  Eyes:     General: No scleral icterus.    Conjunctiva/sclera: Conjunctivae normal.  Neck:  Thyroid: No thyromegaly.  Cardiovascular:     Rate and Rhythm: Normal rate and regular rhythm.     Heart sounds: No murmur heard.    No friction rub. No gallop.  Pulmonary:     Breath sounds: No stridor. No wheezing or rales.  Chest:     Chest wall: No tenderness.  Abdominal:     General: There is no distension.     Tenderness: There is no abdominal tenderness. There is no rebound.  Musculoskeletal:        General: Normal range of motion.     Cervical back: Neck supple.     Comments: 2+ edema up to his groin on both sides  Lymphadenopathy:     Cervical: No cervical adenopathy.  Skin:    Findings: No erythema or rash.   Neurological:     Mental Status: He is alert and oriented to person, place, and time.     Motor: No abnormal muscle tone.     Coordination: Coordination normal.  Psychiatric:        Behavior: Behavior normal.     ED Results / Procedures / Treatments   Labs (all labs ordered are listed, but only abnormal results are displayed) Labs Reviewed  BASIC METABOLIC PANEL - Abnormal; Notable for the following components:      Result Value   Sodium 131 (*)    Potassium 2.9 (*)    Chloride 94 (*)    Glucose, Bld 120 (*)    Calcium 8.4 (*)    All other components within normal limits  CBC - Abnormal; Notable for the following components:   RBC 2.15 (*)    Hemoglobin 8.1 (*)    HCT 24.7 (*)    MCV 114.9 (*)    MCH 37.7 (*)    RDW 20.1 (*)    nRBC 0.4 (*)    All other components within normal limits  HEPATIC FUNCTION PANEL - Abnormal; Notable for the following components:   Total Protein 6.4 (*)    Albumin 3.4 (*)    Alkaline Phosphatase 266 (*)    Total Bilirubin 3.6 (*)    Bilirubin, Direct 1.1 (*)    Indirect Bilirubin 2.5 (*)    All other components within normal limits  TROPONIN I (HIGH SENSITIVITY) - Abnormal; Notable for the following components:   Troponin I (High Sensitivity) 18 (*)    All other components within normal limits  TROPONIN I (HIGH SENSITIVITY) - Abnormal; Notable for the following components:   Troponin I (High Sensitivity) 18 (*)    All other components within normal limits  BRAIN NATRIURETIC PEPTIDE  MAGNESIUM  POC OCCULT BLOOD, ED    EKG EKG Interpretation  Date/Time:  Monday September 24 2022 10:12:41 EST Ventricular Rate:  87 PR Interval:    QRS Duration: 110 QT Interval:  335 QTC Calculation: 401 R Axis:   72 Text Interpretation: Atrial fibrillation Low voltage, extremity leads Repol abnrm suggests ischemia, diffuse leads Minimal ST elevation, lateral leads Confirmed by Milton Ferguson [phone]) on 09/24/2022 1:55:26 PM  Radiology DG Chest Port  1 View  Result Date: 09/24/2022 CLINICAL DATA:  Shortness of breath. EXAM: PORTABLE CHEST 1 VIEW COMPARISON:  November 11, 2021. FINDINGS: Status post coronary bypass graft. Stable mild cardiomegaly with mild central pulmonary vascular congestion. Probable bibasilar pulmonary edema with small pleural effusions. Bony thorax is unremarkable. IMPRESSION: Stable cardiomegaly with mild central pulmonary vascular congestion and probable bibasilar pulmonary edema and small pleural effusions. Electronically Signed  By: Marijo Conception M.D.   On: 09/24/2022 10:52    Procedures Procedures    Medications Ordered in ED Medications  potassium chloride 10 mEq in 100 mL IVPB (has no administration in time range)  potassium chloride SA (KLOR-CON M) CR tablet 40 mEq (has no administration in time range)  pantoprazole (PROTONIX) injection 40 mg (has no administration in time range)  furosemide (LASIX) injection 40 mg (40 mg Intravenous Given 09/24/22 1053)    ED Course/ Medical Decision Making/ A&P  Patient with worsening congestive heart failure.  He also has anemia with positive Hemoccult.  He will be admitted to medicine with cardiology consult     CRITICAL CARE Performed by: Milton Ferguson Total critical care time: 45 minutes Critical care time was exclusive of separately billable procedures and treating other patients. Critical care was necessary to treat or prevent imminent or life-threatening deterioration. Critical care was time spent personally by me on the following activities: development of treatment plan with patient and/or surrogate as well as nursing, discussions with consultants, evaluation of patient's response to treatment, examination of patient, obtaining history from patient or surrogate, ordering and performing treatments and interventions, ordering and review of laboratory studies, ordering and review of radiographic studies, pulse oximetry and re-evaluation of patient's condition.                           Medical Decision Making Amount and/or Complexity of Data Reviewed Labs: ordered.  Risk Prescription drug management. Decision regarding hospitalization.   This patient presents to the ED for concern of swelling in legs, this involves an extensive number of treatment options, and is a complaint that carries with it a high risk of complications and morbidity.  The differential diagnosis includes worsening heart failure   Co morbidities that complicate the patient evaluation  Cardiomyopathy and congestive heart failure   Additional history obtained:  Additional history obtained from patient External records from outside source obtained and reviewed including hospital records   Lab Tests:  I Ordered, and personally interpreted labs.  The pertinent results include: Troponin 18, potassium 2.9   Imaging Studies ordered:  I ordered imaging studies including chest x-ray I independently visualized and interpreted imaging which showed cardiomyopathy I agree with the radiologist interpretation   Cardiac Monitoring: / EKG:  The patient was maintained on a cardiac monitor.  I personally viewed and interpreted the cardiac monitored which showed an underlying rhythm of: Atrial fibs   Consultations Obtained:  I requested consultation with the hospitalist and cardiology,  and discussed lab and imaging findings as well as pertinent plan - they recommend: Hospitalist admit and cardiology's   Problem List / ED Course / Critical interventions / Medication management  Atrial fibs cardiomyopathy congestive heart failure I ordered medication including Lasix for heart failure Reevaluation of the patient after these medicines showed that the patient improved I have reviewed the patients home medicines and have made adjustments as needed   Social Determinants of Health:  None   Test / Admission - Considered:  None     Patient with worsening congestive heart  failure, along with anasarca hypokalemia, anemia.  Medicine will admit with cardiology consult        Final Clinical Impression(s) / ED Diagnoses Final diagnoses:  None    Rx / DC Orders ED Discharge Orders     None         Milton Ferguson, MD 09/25/22 1120  Milton Ferguson, MD 09/25/22 1121

## 2022-09-24 NOTE — ED Triage Notes (Signed)
Patient reports [facility] for the past 12 days with edema to BLE. States that he has been taking Lasix with increased urinary output. Reports frequent cough and weight gain with decreased appetite.

## 2022-09-24 NOTE — Consult Note (Addendum)
Cardiology Consultation   Patient ID: RAFFAEL BUGARIN MRN: [id number]; DOB: November 02, 1940  Admit date: 09/24/2022 Date of Consult: 09/24/2022  PCP:  Alvira Monday, [place] [facility] Cardiologist:  Rozann Lesches, MD        Patient Profile:   Leonard Cisneros is a 82 y.o. male with a hx of CAD, chronic HFpEF, moderate aortic stenosis who is being seen 09/24/2022 for the evaluation of SOB and leg edema at the request of Dr Wynetta Emery.  History of Present Illness:   Leonard Cisneros 82 yo male history of CAD with CABG 2003 LIMA-LAD, SVG-OM, RIMA-RCA, bare-metal stent to SVG with LIMA-OM LAD and RIMA-dRCA okay, HTN, PAF/aflutter with prior ablation. Chronic HFpEF, moderate AS admitted with SOB and leg edema. Ongoing symptoms despite increased of home torsemide, had been taking up to '20mg'$  qid.    K 2.9 Na 131 Cr 0.85 BUN 15 WBC 6.9 Hgb 8.1 Plt 206 BNP 288  Trop 18-->18 CXR mild congestion, probable bibasilar edema EKG afib 87  11/2021 nuclear stress: no ischemia 11/2021 echo: LVEF 55-60%, no WMAs, indet diastolic fxn, normal RV, severe pulm HTN PASP 69.8, severe LAE, mod AS mean grad 27 AVA VTI 1.44  Past Medical History:  Diagnosis Date   Aortic stenosis    Atrial flutter (HCC)    a. s/p remote ablation by Dr. [name].   CAD (coronary artery disease)    a. s/p CABG 2003. b. 01/2016: unstable angina - s/p BMS to SVG-ramus intermediate, patent LIMA-mLAD, patent RIMA-dRCA.    Chronic lower back pain    Essential hypertension    Hyperlipidemia    Ischemic cardiomyopathy    a. Cath 01/2016 -  EF 50-55% with mild distal inferior hypocontractility.   Peripheral neuropathy 04/04/2014   Permanent atrial fibrillation [facility])     Past Surgical History:  Procedure Laterality Date   BACK SURGERY     CARDIAC CATHETERIZATION  ~ 2000; 2003   CARDIAC CATHETERIZATION N/A 02/01/2016   Procedure: Left Heart Cath and Cors/Grafts Angiography;  Surgeon: [name], MD;  Location: [facility];  Service: Cardiovascular;  Laterality: N/A;   CARDIAC CATHETERIZATION N/A 02/01/2016   Procedure: Coronary Stent Intervention;  Surgeon: [name], MD;  Location: [place] [facility];  Service: Cardiovascular;  Laterality: N/A;   CATARACT EXTRACTION W/PHACO Left 02/09/2019   Procedure: CATARACT EXTRACTION PHACO AND INTRAOCULAR LENS PLACEMENT (IOC);  Surgeon: [name], MD;  Location: [facility];  Service: Ophthalmology;  Laterality: Left;  CDE: 27.70   CATARACT EXTRACTION W/PHACO Right 02/23/2019   Procedure: CATARACT EXTRACTION PHACO AND INTRAOCULAR LENS PLACEMENT RIGHT EYE;  Surgeon: [name], MD;  Location: [facility];  Service: Ophthalmology;  Laterality: Right;  CDE: 10.15   COLONOSCOPY  06/04/2002   Normal colon and rectum   COLONOSCOPY  07/08/2012   Procedure: COLONOSCOPY;  Surgeon: [name], MD;  Location: [facility];  Service: Endoscopy;  Laterality: N/A;  11:30   COLONOSCOPY N/A 10/02/2017   Procedure: COLONOSCOPY;  Surgeon: [name], MD;  Location: [facility];  Service: Endoscopy;  Laterality: N/A;  10:30am   CORONARY ANGIOPLASTY WITH STENT PLACEMENT  02/01/2016   CORONARY ARTERY BYPASS GRAFT  2003   LIMA to LAD,LIMA to distal RCA,SVG to ramus intermediate vessel.   FEMORAL REVISION Right 03/30/2019   Procedure: Femoral revision right total hip arthroplasty;  Surgeon: [name], MD;  Location: [facility];  Service: Orthopedics;  Laterality: Right;  138mn   FOREIGN BODY REMOVAL Right 04/02/2014   Procedure: FOREIGN BODY REMOVAL RIGHT FOOT;  Surgeon: MJamesetta So MD;  Location: [facility];  Service: General;  Laterality: Right;   JOINT REPLACEMENT     JOINT REPLACEMENT     LUMBAR DMelroseSURGERY     REVISION TOTAL HIP ARTHROPLASTY Bilateral 2005-2012   right-left   SHOULDER OPEN ROTATOR CUFF REPAIR Right    TOTAL HIP ARTHROPLASTY Right 1999   TOTAL HIP ARTHROPLASTY Left 2008   [place]  11/13/2011   LV mildly dilated,mild concentric  LVH,LA mod - severely dilated,RA mildly dilated,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      Inpatient Medications: Scheduled Meds:  apixaban  5 mg Oral BID   atorvastatin  80 mg Oral QPM   [START ON 09/25/2022] diltiazem  240 mg Oral Daily   furosemide  40 mg Intravenous Q12H   [START ON 09/25/2022] isosorbide mononitrate  30 mg Oral Daily   [START ON 09/25/2022] metoprolol succinate  100 mg Oral Daily   pantoprazole  40 mg Intravenous Once   potassium chloride  30 mEq Oral BID   potassium chloride SA  40 mEq Oral Once   Continuous Infusions:  potassium chloride     PRN Meds: acetaminophen **OR** acetaminophen, bisacodyl, fentaNYL (SUBLIMAZE) injection, nitroGLYCERIN, ondansetron **OR** ondansetron (ZOFRAN) IV, oxyCODONE, traZODone  Allergies:    Allergies  Allergen Reactions   Augmentin [Amoxicillin-Pot Clavulanate] Nausea And Vomiting and Other (See Comments)    Has patient had a PCN reaction causing immediate rash, facial/tongue/throat swelling, SOB or lightheadedness with hypotension: Unknown Has patient had a PCN reaction causing severe rash involving mucus membranes or skin necrosis: No Has patient had a PCN reaction that required hospitalization: No Has patient had a PCN reaction occurring within the last 10 years: No If all of the above answers are "NO", then may proceed with Cephalosporin use.     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    Employer: RETIRED  Tobacco Use   Smoking status: Former    Packs/day: 0.50    Years: 2.00    Total pack years: 1.00    Types: Cigarettes    Quit date: 09/03/1966    Years since quitting: 56.0   Smokeless tobacco: Never   Vaping Use   Vaping Use: Never used  Substance and Sexual Activity   Alcohol use: Yes    Alcohol/week: 11.0 standard drinks of alcohol    Types: 7 Glasses of wine, 4 Cans of beer per week    Comment: a glass of wine with dinner/ beer on the weekend   Drug use: No   Sexual activity: Not on file  Other Topics Concern   Not on file  Social History Narrative   Lives with his wife.   Social Determinants of Health   Financial Resource Strain: Low Risk  (04/21/2018)   Overall Financial Resource Strain (CARDIA)    Difficulty of Paying Living Expenses: Not hard at all  Food Insecurity: No Food Insecurity (04/21/2018)   Hunger Vital Sign    Worried About Running Out of Food in the Last Year: Never true  Ran Out of Food in the Last Year: Never true  Transportation Needs: No Transportation Needs (04/21/2018)   PRAPARE - Hydrologist (Medical): No    Lack of Transportation (Non-Medical): No  Physical Activity: Not on file  Stress: Stress Concern Present (04/21/2018)   [place]    Feeling of Stress : To some extent  Social Connections: Unknown (04/21/2018)   Social Connection and Isolation Panel [NHANES]    Frequency of Communication with Friends and Family: More than three times a week    Frequency of Social Gatherings with Friends and Family: Three times a week    Attends Religious Services: Patient refused    Active Member of Clubs or Organizations: Patient refused    Attends Archivist Meetings: Patient refused    Marital Status: Married  Human resources officer Violence: Not At Risk (04/21/2018)   Humiliation, Afraid, Rape, and Kick questionnaire    Fear of Current or Ex-Partner: No    Emotionally Abused: No    Physically Abused: No    Sexually Abused: No    Family History:    Family History  Problem Relation Age of Onset   Heart attack Brother    Colon cancer Neg Hx      ROS:   Please see the history of present illness.   All other ROS reviewed and negative.     Physical Exam/Data:   Vitals:   09/24/22 1244 09/24/22 1300 09/24/22 1330 09/24/22 1400  BP:  127/60 112/61 (!) 117/58  Pulse: 78 75 80 75  Resp: 20 (!) 24 (!) 24 18  Temp:      TempSrc:      SpO2: 97% 98% 98% 98%  Weight:       No intake or output data in the 24 hours ending 09/24/22 1435    09/24/2022   10:10 AM 09/17/2022    3:41 PM 04/10/2022    9:40 AM  Last 3 Weights  Weight (lbs) 226 lb 226 lb 6.4 oz 212 lb 9.6 oz  Weight (kg) 102.513 kg 102.694 kg 96.435 kg     Body mass index is 31.52 kg/m.  General:  Well nourished, well developed, in no acute distress HEENT: normal Neck:+JVD Vascular: No carotid bruits; Distal pulses 2+ bilaterally Cardiac:  irreg, 2/6 systolic murmur rusb, no jvd Lungs:  clear to auscultation bilaterally, no wheezing, rhonchi or rales  Abd: soft, nontender, no hepatomegaly  Ext: trace bilateral edema Musculoskeletal:  No deformities, BUE and BLE strength normal and equal Skin: warm and dry  Neuro:  CNs 2-12 intact, no focal abnormalities noted Psych:  Normal affect     Laboratory Data:  High Sensitivity Troponin:   Recent Labs  Lab 09/24/22 1020 09/24/22 1242  TROPONINIHS 18* 18*     Chemistry Recent Labs  Lab 09/24/22 1020  NA 131*  K 2.9*  CL 94*  CO2 26  GLUCOSE 120*  BUN 15  CREATININE 0.85  CALCIUM 8.4*  GFRNONAA >60  ANIONGAP 11    Recent Labs  Lab 09/24/22 1020  PROT 6.4*  ALBUMIN 3.4*  AST 33  ALT 29  ALKPHOS 266*  BILITOT 3.6*   Lipids No results for input(s): "CHOL", "TRIG", "HDL", "LABVLDL", "LDLCALC", "CHOLHDL" in the last 168 hours.  Hematology Recent Labs  Lab 09/24/22 1020  WBC 6.9  RBC 2.15*  HGB 8.1*  HCT 24.7*  MCV 114.9*  MCH 37.7*  MCHC 32.8  RDW 20.1*  PLT 206   Thyroid No results for input(s): "TSH", "FREET4" in the last 168 hours.  BNPNo results for input(s): "BNP", "PROBNP" in the last 168  hours.  DDimer No results for input(s): "DDIMER" in the last 168 hours.   Radiology/Studies:  [facility] Chest Port 1 View  Result Date: 09/24/2022 CLINICAL DATA:  Shortness of breath. EXAM: PORTABLE CHEST 1 VIEW COMPARISON:  November 11, 2021. FINDINGS: Status post coronary bypass graft. Stable mild cardiomegaly with mild central pulmonary vascular congestion. Probable bibasilar pulmonary edema with small pleural effusions. Bony thorax is unremarkable. IMPRESSION: Stable cardiomegaly with mild central pulmonary vascular congestion and probable bibasilar pulmonary edema and small pleural effusions. Electronically Signed   By: Marijo Conception M.D.   On: 09/24/2022 10:52     Assessment and Plan:   1.Acute on chronic HFpEF - 11/2021 echo: LVEF 55-60%, no WMAs, indet diastolic fxn, normal RV, severe pulm HTN PASP 69.8, severe LAE, mod AS mean grad 27 AVA VTI 1.44 - bnp 288, CXR with congestion - did not improve with higher tosremide dosing, was taking torsemide '20mg'$  qid.   - started on IV lasix '40mg'$  bid, follow diuresis today - f/u reds vest measurement - f/u repeat echo.  - consider SGLT2i  2. CAD - no active issues - f/u echo  3. PAF - continue eliquis,rate control  4. Moderate AS - continue to monitor, f/u repeat echo   For questions or updates, please contact [place] Please consult [email or web address] for contact info under    Signed, Carlyle Dolly, MD  09/24/2022 2:35 PM

## 2022-09-24 NOTE — Hospital Course (Signed)
82 year old gentleman with history of a flutter, ischemic cardiomyopathy status post CABG x 2, coronary artery disease, HFpEF, hypertension, hyperlipidemia, moderate aortic stenosis, permanent atrial fibrillation on apixaban reports that for the past 12 days he has had shortness of breath and increasing edema in the legs.  He has been taking Demadex and increasing the doses at home but it has not been able to control his symptoms although he has had an increase in urination since increasing the doses.  He reports having a cough, diminished appetite, weight gain and intermittent chest pain episodes.  He was evaluated in the emergency department and noted to have a hemoglobin down to 8.1, potassium down to 2.9, bilirubin elevated at 3.6, sodium down to 131, BNP 288, troponin 18, 18. Chest xray with findings of pulmonary edema.  He was started on IV lasix and admission requested for further diuresis and cardiology consultation.

## 2022-09-24 NOTE — H&P (Signed)
History and Physical  [place] WNI:[id number] DOB: 22-Sep-1940 DOA: 09/24/2022  PCP: Alvira Monday, FNP  Patient coming from: Home  Level of care: Telemetry  I have personally briefly reviewed patient's old medical records in [place] [facility]  Chief Complaint: SOB   HPI: Leonard Cisneros is a 82 year old gentleman with history of a flutter, ischemic cardiomyopathy status post CABG x 2, coronary artery disease, HFpEF, hypertension, hyperlipidemia, moderate aortic stenosis, permanent atrial fibrillation on apixaban reports that for the past 12 days he has had shortness of breath and increasing edema in the legs.  He has been taking Demadex and increasing the doses at home but it has not been able to control his symptoms although he has had an increase in urination since increasing the doses.  He reports having a cough, diminished appetite, weight gain and intermittent chest pain episodes.  He was evaluated in the emergency department and noted to have a hemoglobin down to 8.1, potassium down to 2.9, bilirubin elevated at 3.6, sodium down to 131, BNP 288, troponin 18, 18. Chest xray with findings of pulmonary edema.  He was started on IV lasix and admission requested for further diuresis and cardiology consultation.     Past Medical History:  Diagnosis Date   Aortic stenosis    Atrial flutter ([place])    a. s/p remote ablation by Dr. [name].   CAD (coronary artery disease)    a. s/p CABG 2003. b. 01/2016: unstable angina - s/p BMS to SVG-ramus intermediate, patent LIMA-mLAD, patent RIMA-dRCA.    Chronic lower back pain    Essential hypertension    Hyperlipidemia    Ischemic cardiomyopathy    a. Cath 01/2016 -  EF 50-55% with mild distal inferior hypocontractility.   Peripheral neuropathy 04/04/2014   Permanent atrial fibrillation [facility])     Past Surgical History:  Procedure Laterality Date   BACK SURGERY     CARDIAC CATHETERIZATION  ~ 2000; 2003   CARDIAC  CATHETERIZATION N/A 02/01/2016   Procedure: Left Heart Cath and Cors/Grafts Angiography;  Surgeon: [name], MD;  Location: [place] [facility] LAB;  Service: Cardiovascular;  Laterality: N/A;   CARDIAC CATHETERIZATION N/A 02/01/2016   Procedure: Coronary Stent Intervention;  Surgeon: [name], MD;  Location: [place] [facility];  Service: Cardiovascular;  Laterality: N/A;   CATARACT EXTRACTION W/PHACO Left 02/09/2019   Procedure: CATARACT EXTRACTION PHACO AND INTRAOCULAR LENS PLACEMENT (IOC);  Surgeon: [name], MD;  Location: [facility];  Service: Ophthalmology;  Laterality: Left;  CDE: 27.70   CATARACT EXTRACTION W/PHACO Right 02/23/2019   Procedure: CATARACT EXTRACTION PHACO AND INTRAOCULAR LENS PLACEMENT RIGHT EYE;  Surgeon: [name], MD;  Location: [facility];  Service: Ophthalmology;  Laterality: Right;  CDE: 10.15   COLONOSCOPY  06/04/2002   Normal colon and rectum   COLONOSCOPY  07/08/2012   Procedure: COLONOSCOPY;  Surgeon: [name], MD;  Location: [facility];  Service: Endoscopy;  Laterality: N/A;  11:30   COLONOSCOPY N/A 10/02/2017   Procedure: COLONOSCOPY;  Surgeon: [name], MD;  Location: [facility];  Service: Endoscopy;  Laterality: N/A;  10:30am   CORONARY ANGIOPLASTY WITH STENT PLACEMENT  02/01/2016   CORONARY ARTERY BYPASS GRAFT  2003   LIMA to LAD,LIMA to distal RCA,SVG to ramus intermediate vessel.   FEMORAL REVISION Right 03/30/2019   Procedure: Femoral revision right total hip arthroplasty;  Surgeon: [name], MD;  Location: [facility];  Service: Orthopedics;  Laterality: Right;  162mn   FOREIGN BODY REMOVAL Right 04/02/2014   Procedure: FOREIGN BODY REMOVAL RIGHT FOOT;  Surgeon: MJamesetta So MD;  Location: [facility];  Service: General;  Laterality: Right;   JOINT REPLACEMENT     JOINT REPLACEMENT     [place]BAR [place]     REVISION TOTAL HIP ARTHROPLASTY Bilateral 2005-2012   right-left   SHOULDER OPEN ROTATOR CUFF REPAIR Right    TOTAL HIP  ARTHROPLASTY Right 1999   TOTAL HIP ARTHROPLASTY Left 2008   [place]  11/13/2011   LV mildly dilated,mild concentric LVH,LA mod - severely dilated,RA mildly dilated,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     reports that he quit smoking about 56 years ago. His smoking use included cigarettes. He has a 1.00 pack-year smoking history. He has never used smokeless tobacco. He reports current alcohol use of about 11.0 standard drinks of alcohol per week. He reports that he does not use drugs.  Allergies  Allergen Reactions   Augmentin [Amoxicillin-Pot Clavulanate] Nausea And Vomiting and Other (See Comments)    Has patient had a PCN reaction causing immediate rash, facial/tongue/throat swelling, SOB or lightheadedness with hypotension: Unknown Has patient had a PCN reaction causing severe rash involving mucus membranes or skin necrosis: No Has patient had a PCN reaction that required hospitalization: No Has patient had a PCN reaction occurring within the last 10 years: No If all of the above answers are "NO", then may proceed with Cephalosporin use.     Family History  Problem Relation Age of Onset   Heart attack Brother    Colon cancer Neg Hx     Prior to Admission medications   Medication Sig Start Date End Date Taking? Authorizing Provider  apixaban (ELIQUIS) 5 MG TABS tablet Take 1 tablet (5 mg total) by mouth 2 (two) times daily. 12/11/21 04/11/23 Yes MSatira Sark MD  atorvastatin (LIPITOR) 80 MG tablet TAKE 1 TABLET BY MOUTH EVERY DAY 12/07/21  Yes MSatira Sark MD  diltiazem (DILT-XR) 240 MG 24 hr capsule TAKE 1 CAPSULE BY MOUTH EVERY DAY 12/07/21  Yes MSatira Sark MD  isosorbide mononitrate (IMDUR) 30 MG 24 hr tablet Take 1 tablet (30 mg total) by mouth daily. 09/05/22 08/31/23 Yes MSatira Sark MD  metoprolol succinate (TOPROL-XL) 100 MG 24 hr tablet TAKE 1 TABLET BY MOUTH EVERY  DAY WITH OR IMMEDIATELY FOLLOWING A MEAL 05/08/22  Yes MSatira Sark MD  nitroGLYCERIN (NITROSTAT) 0.4 MG SL tablet Place 1 tablet (0.4 mg total) under the tongue every 5 (five) minutes x 3 doses as needed for chest pain (if no relief after 3rd dose, proceed to the ED for an evaluation or call 911). 09/05/22  Yes MSatira Sark MD  torsemide (DEMADEX) 20 MG tablet TAKE 2 TABLETS (40 MG) DAILY. MAY TAKE ADDITIONAL 40 MG AS NEEDED FOR FLUID GAIN 02/16/22  Yes MSatira Sark MD    Physical Exam: Vitals:   09/24/22 1244 09/24/22 1300 09/24/22 1330 09/24/22 1400  BP:  127/60 112/61 (!) 117/58  Pulse: 78 75 80 75  Resp: 20 (!) 24 (!) 24 18  Temp:      TempSrc:      SpO2: 97% 98% 98% 98%  Weight:       Constitutional: elderly frail gentleman, awake, alert, NAD, calm, comfortable, not distressed;  Eyes: PERRL, lids and conjunctivae normal ENMT: Mucous membranes are pale. Posterior  pharynx clear of any exudate or lesions.  Neck: normal, supple, no masses, no thyromegaly Respiratory: bibasilar crackles. Normal respiratory effort. No accessory muscle use.  Cardiovascular: normal s1, s2 sounds, irregularly irregular, soft 2/6 murmur, no rubs / gallops. 1+ extremity edema. 2+ pedal pulses. No carotid bruits.  Abdomen: no tenderness, no masses palpated. No hepatosplenomegaly. Bowel sounds positive.  Musculoskeletal: 1+ edema BLEs, no clubbing / cyanosis. No joint deformity upper and lower extremities. Good ROM, no contractures. Normal muscle tone.  Skin: chronic venous stasis dermatitis and scaling of bilateral legs;  no ulcers seen.  Neurologic: CN 2-12 grossly intact. Sensation intact, DTR normal. Strength 5/5 in all 4.  Psychiatric: Normal judgment and insight. Alert and oriented x 3. Normal mood.   Labs on Admission: I have personally reviewed following labs and imaging studies  CBC: Recent Labs  Lab 09/24/22 1020  WBC 6.9  HGB 8.1*  HCT 24.7*  MCV 114.9*  PLT 836   Basic  Metabolic Panel: Recent Labs  Lab 09/24/22 1020 09/24/22 1431  NA 131*  --   K 2.9*  --   CL 94*  --   CO2 26  --   GLUCOSE 120*  --   BUN 15  --   CREATININE 0.85  --   CALCIUM 8.4*  --   MG  --  1.9   GFR: Estimated Creatinine Clearance: 83.1 mL/min (by C-G formula based on SCr of 0.85 mg/dL). Liver Function Tests: Recent Labs  Lab 09/24/22 1020  AST 33  ALT 29  ALKPHOS 266*  BILITOT 3.6*  PROT 6.4*  ALBUMIN 3.4*   No results for input(s): "LIPASE", "AMYLASE" in the last 168 hours. No results for input(s): "AMMONIA" in the last 168 hours. Coagulation Profile: No results for input(s): "INR", "PROTIME" in the last 168 hours. Cardiac Enzymes: No results for input(s): "CKTOTAL", "CKMB", "CKMBINDEX", "TROPONINI" in the last 168 hours. BNP (last 3 results) No results for input(s): "PROBNP" in the last 8760 hours. HbA1C: No results for input(s): "HGBA1C" in the last 72 hours. CBG: No results for input(s): "GLUCAP" in the last 168 hours. Lipid Profile: No results for input(s): "CHOL", "HDL", "LDLCALC", "TRIG", "CHOLHDL", "LDLDIRECT" in the last 72 hours. Thyroid Function Tests: No results for input(s): "TSH", "T4TOTAL", "FREET4", "T3FREE", "THYROIDAB" in the last 72 hours. Anemia Panel: Recent Labs    09/24/22 1431  RETICCTPCT 2.9   Urine analysis:    Component Value Date/Time   COLORURINE YELLOW 04/09/2019 1105   APPEARANCEUR CLEAR 04/09/2019 1105   LABSPEC 1.011 04/09/2019 1105   PHURINE 5.0 04/09/2019 1105   GLUCOSEU NEGATIVE 04/09/2019 1105   HGBUR NEGATIVE 04/09/2019 1105   [place] 04/09/2019 1105   [place] 04/09/2019 1105   PROTEINUR NEGATIVE 04/09/2019 1105   UROBILINOGEN 0.2 11/28/2010 0915   NITRITE NEGATIVE 04/09/2019 1105   LEUKOCYTESUR NEGATIVE 04/09/2019 1105    Radiological Exams on Admission: DG Chest Port 1 View  Result Date: 09/24/2022 CLINICAL DATA:  Shortness of breath. EXAM: PORTABLE CHEST 1 VIEW COMPARISON:   November 11, 2021. FINDINGS: Status post coronary bypass graft. Stable mild cardiomegaly with mild central pulmonary vascular congestion. Probable bibasilar pulmonary edema with small pleural effusions. Bony thorax is unremarkable. IMPRESSION: Stable cardiomegaly with mild central pulmonary vascular congestion and probable bibasilar pulmonary edema and small pleural effusions. Electronically Signed   By: Marijo Conception M.D.   On: 09/24/2022 10:52    EKG: Independently reviewed. Atrial fibrillation   Assessment/Plan Principal Problem:   Acute  heart failure (HCC) Active Problems:   High risk medication use   Hx of CABG   Impaired glucose metabolism   Obesity (BMI 30-39.9)   Essential hypertension   Neuropathic pain   Exertional angina   Hyperlipidemia LDL goal <70   CAD S/P percutaneous coronary angioplasty   Ischemic cardiomyopathy   PAD (peripheral artery disease) (HCC)   Physical deconditioning   Gait instability   Afib (HCC)   ETOH abuse   Severe pulmonary hypertension (HCC)   Moderate aortic stenosis   Heart failure (HCC)   Hypokalemia   Hyponatremia   Elevated liver enzymes   Hyperbilirubinemia   Volume overload   Acquired thrombophilia (HCC)   Microcytic anemia   Acute HFpEF - pt has failed outpatient management with increasing torsemide doses at home - admit for IV lasix diuresis  - monitor daily weight, intake/output, RedS vest; electrolytes  - update TTE this admission  - daily BMP, Mg - 2 gm sodium restricted, fluid restricted diet - follow up repeat CXR in AM   Ischemic Cardiomyopathy  - he reports intermittent chest pain  - resume home imdur and metoprolol - NTG prn CP   Permanent Atrial Fibrillation  - he is on apixaban for full anticoagulation  - metoprolol XL 100 mg and diltiazem XR 240 mg for heart rate control  - may have to hold apixaban if stool guaiac is positive   Hyperlipidemia - resume home atorvastatin 80 mg QHS   Hyperbilirubinemia  -  recheck labs in AM  - follow up abd [place] results   Hypokalemia and hyponatremia  - from recent diuretic increases at home - repleting potassium now - recheck BMP in AM  - check magnesium and replace as needed  Microcytic anemia - follow up stool guaiac testing  - anemia panel pending  - transfuse for Hg<8 given severe CAD  Acquired thrombophilia - we are in process to hemoccult stools - if heme positive will need to hold apixaban - consider GI consult if heme positive   DVT prophylaxis: apixaban, TED hose Code Status: Full   Family Communication:   Disposition Plan: anticipate home   Consults called: cardiology    Admission status: IP  Level of care: Telemetry Irwin Brakeman MD [facility] How to contact the [facility] Attending or Consulting provider 7A - 7P or covering provider during after hours 7P -7A, for this patient?  Check the care team in [facility] and look for a) attending/consulting [facility] provider listed and b) the [facility] team listed Log into [email or web address] and use [place]'s universal password to access. If you do not have the password, please contact the hospital operator. Locate the [facility] provider you are looking for under [facility] and page to a number that you can be directly reached. If you still have difficulty reaching the provider, please page the [facility] (Director on Call) for the Hospitalists listed on amion for assistance.   If 7PM-7AM, please contact night-coverage [email or web address] Password [facility]  09/24/2022, 2:58 PM

## 2022-09-25 ENCOUNTER — Inpatient Hospital Stay (HOSPITAL_COMMUNITY): Payer: Medicare HMO

## 2022-09-25 DIAGNOSIS — I5031 Acute diastolic (congestive) heart failure: Secondary | ICD-10-CM | POA: Diagnosis not present

## 2022-09-25 DIAGNOSIS — I272 Pulmonary hypertension, unspecified: Secondary | ICD-10-CM

## 2022-09-25 DIAGNOSIS — I5033 Acute on chronic diastolic (congestive) heart failure: Secondary | ICD-10-CM | POA: Diagnosis not present

## 2022-09-25 DIAGNOSIS — D6869 Other thrombophilia: Secondary | ICD-10-CM | POA: Diagnosis not present

## 2022-09-25 DIAGNOSIS — I350 Nonrheumatic aortic (valve) stenosis: Secondary | ICD-10-CM | POA: Diagnosis not present

## 2022-09-25 DIAGNOSIS — D509 Iron deficiency anemia, unspecified: Secondary | ICD-10-CM

## 2022-09-25 LAB — ECHOCARDIOGRAM COMPLETE
AR max vel: 1.13 cm2
AV Area VTI: 1.06 cm2
AV Area mean vel: 1.05 cm2
AV Mean grad: 24 mmHg
AV Peak grad: 42.1 mmHg
Ao pk vel: 3.25 m/s
Area-P 1/2: 4.83 cm2
Height: 71 in
MV VTI: 2.08 cm2
S' Lateral: 3.9 cm
Weight: 3569.69 oz

## 2022-09-25 LAB — COMPREHENSIVE METABOLIC PANEL
ALT: 29 U/L (ref 0–44)
AST: 35 U/L (ref 15–41)
Albumin: 3.1 g/dL — ABNORMAL LOW (ref 3.5–5.0)
Alkaline Phosphatase: 233 U/L — ABNORMAL HIGH (ref 38–126)
Anion gap: 7 (ref 5–15)
BUN: 13 mg/dL (ref 8–23)
CO2: 28 mmol/L (ref 22–32)
Calcium: 8.2 mg/dL — ABNORMAL LOW (ref 8.9–10.3)
Chloride: 100 mmol/L (ref 98–111)
Creatinine, Ser: 0.89 mg/dL (ref 0.61–1.24)
GFR, Estimated: >60 mL/min (ref >60)
Glucose, Bld: 99 mg/dL (ref 70–99)
Potassium: 3.6 mmol/L (ref 3.5–5.1)
Sodium: 135 mmol/L (ref 135–145)
Total Bilirubin: 3.8 mg/dL — ABNORMAL HIGH (ref 0.3–1.2)
Total Protein: 6 g/dL — ABNORMAL LOW (ref 6.5–8.1)

## 2022-09-25 LAB — CBC
HCT: 23.9 % — ABNORMAL LOW (ref 39.0–52.0)
Hemoglobin: 7.6 g/dL — ABNORMAL LOW (ref 13.0–17.0)
MCH: 37.3 pg — ABNORMAL HIGH (ref 26.0–34.0)
MCHC: 31.8 g/dL (ref 30.0–36.0)
MCV: 117.2 fL — ABNORMAL HIGH (ref 80.0–100.0)
Platelets: 176 10*3/uL (ref 150–400)
RBC: 2.04 MIL/uL — ABNORMAL LOW (ref 4.22–5.81)
RDW: 20.6 % — ABNORMAL HIGH (ref 11.5–15.5)
WBC: 8 10*3/uL (ref 4.0–10.5)
nRBC: 0 % (ref 0.0–0.2)

## 2022-09-25 LAB — BRAIN NATRIURETIC PEPTIDE: B Natriuretic Peptide: 247 pg/mL — ABNORMAL HIGH (ref 0.0–100.0)

## 2022-09-25 LAB — MAGNESIUM: Magnesium: 2 mg/dL (ref 1.7–2.4)

## 2022-09-25 MED ORDER — LIVING BETTER WITH HEART FAILURE BOOK: Freq: Once | Status: AC

## 2022-09-25 NOTE — Progress Notes (Signed)
PROGRESS NOTE   Leonard Cisneros  QMV:[id number] DOB: Dec 13, 1940 DOA: 09/24/2022 PCP: Alvira Monday, FNP   Chief Complaint  Patient presents with   Shortness of Breath   Level of care: Telemetry  Brief Admission History:  82 year old gentleman with history of a flutter, ischemic cardiomyopathy status post CABG x 2, coronary artery disease, HFpEF, hypertension, hyperlipidemia, moderate aortic stenosis, permanent atrial fibrillation on apixaban reports that for the past 12 days he has had shortness of breath and increasing edema in the legs.  He has been taking Demadex and increasing the doses at home but it has not been able to control his symptoms although he has had an increase in urination since increasing the doses.  He reports having a cough, diminished appetite, weight gain and intermittent chest pain episodes.  He was evaluated in the emergency department and noted to have a hemoglobin down to 8.1, potassium down to 2.9, bilirubin elevated at 3.6, sodium down to 131, BNP 288, troponin 18, 18. Chest xray with findings of pulmonary edema.  He was started on IV lasix and admission requested for further diuresis and cardiology consultation.    Assessment and Plan:  Acute HFpEF - pt has failed outpatient management with increasing torsemide doses at home - admitted for IV lasix diuresis  - monitor daily weight, intake/output, RedS vest; electrolytes  - update TTE this admission, follow up reading  - daily BMP, Mg - 2 gm sodium restricted, fluid restricted diet - follow up repeat CXR in AM  - ReDs vest: 31    Ischemic Cardiomyopathy  - he reports intermittent chest pain  - resumed home imdur and metoprolol - NTG prn CP    Permanent Atrial Fibrillation  - he is on apixaban for full anticoagulation  - metoprolol XL 100 mg and diltiazem XR 240 mg for heart rate control  - may have to hold apixaban if stool guaiac is positive    Hyperlipidemia - resume home atorvastatin 80 mg  QHS    Hyperbilirubinemia  - recheck labs in AM  - abd [place] consistent with fatty liver disease and cholelithiasis but no evidence of cholecystitis   Hypokalemia and hyponatremia  - from recent diuretic increases at home - repleting potassium now - recheck BMP in AM  - check magnesium and replace as needed   Microcytic anemia - follow up stool guaiac testing  - anemia panel pending  - transfuse for Hg<8 - hemoccult all stools    Acquired thrombophilia - we are in process to hemoccult stools - if heme positive will need to hold apixaban - consider GI consult if heme positive    DVT prophylaxis: apixaban Code Status: Full  Family Communication:  Disposition: Status is: Inpatient Remains inpatient appropriate because: IV furosemide required   Consultants:  Cardiology  Procedures:   Antimicrobials:    Subjective: Pt says he feels somewhat better with less SOB, no chest pain today; urinating frequently on IV lasix   Objective: Vitals:   09/25/22 0219 09/25/22 0540 09/25/22 0700 09/25/22 1340  BP:  (!) 111/54  109/64  Pulse:  75  69  Resp:  20  19  Temp:  97.6 F (36.4 C)  97.9 F (36.6 C)  TempSrc:    Oral  SpO2: 95% 100%  100%  Weight:   101.2 kg   Height:        Intake/Output Summary (Last 24 hours) at 09/25/2022 1456 Last data filed at 09/25/2022 1300 Gross per 24 hour  Intake 1460  ml  Output 1600 ml  Net -140 ml   Filed Weights   09/24/22 1010 09/24/22 1626 09/25/22 0700  Weight: 102.5 kg 102.3 kg 101.2 kg   Examination:  General exam: Appears calm and comfortable  Respiratory system: bibasilar crackles. Respiratory effort normal. Cardiovascular system: normal S1 & S2 heard. No JVD, murmurs, rubs, gallops or clicks. No pedal edema. Gastrointestinal system: Abdomen is nondistended, soft and nontender. No organomegaly or masses felt. Normal bowel sounds heard. Central nervous system: Alert and oriented. No focal neurological deficits. Extremities: 2+  edema BLEs, Symmetric 5 x 5 power. Skin: diffuse scales bilateral LEs, chronic venous stasis changes, No ulcers. Psychiatry: Judgement and insight appear normal. Mood & affect appropriate.   Data Reviewed: I have personally reviewed following labs and imaging studies  CBC: Recent Labs  Lab 09/24/22 1020 09/25/22 0442  WBC 6.9 8.0  HGB 8.1* 7.6*  HCT 24.7* 23.9*  MCV 114.9* 117.2*  PLT 206 735    Basic Metabolic Panel: Recent Labs  Lab 09/24/22 1020 09/24/22 1431 09/25/22 0442  NA 131*  --  135  K 2.9*  --  3.6  CL 94*  --  100  CO2 26  --  28  GLUCOSE 120*  --  99  BUN 15  --  13  CREATININE 0.85  --  0.89  CALCIUM 8.4*  --  8.2*  MG  --  1.9 2.0    CBG: No results for input(s): "GLUCAP" in the last 168 hours.  No results found for this or any previous visit (from the past 240 hour(s)).   Radiology Studies: ECHOCARDIOGRAM COMPLETE  Result Date: 09/25/2022    ECHOCARDIOGRAM REPORT   Patient Name:   Leonard Cisneros Date of Exam: 09/25/2022 Medical Rec #:  [id number]        Height:       71.0 in Accession #:    [id number]       Weight:       223.1 lb Date of Birth:  Jul 17, 1941       BSA:          2.209 m Patient Age:    73 years         BP:           111/54 mmHg Patient Gender: M                HR:           74 bpm. Exam Location:  [facility] Procedure: 2D Echo, Cardiac Doppler and Color Doppler Indications:    CHF  History:        Patient has prior history of Echocardiogram examinations, most                 recent 11/12/2021. CHF and Cardiomyopathy, CAD, Prior CABG, PAD                 and Pulmonary HTN, Arrythmias:Atrial Fibrillation,                 Signs/Symptoms:Chest Pain; Risk Factors:Hypertension and                 Dyslipidemia. ETOH abuse.  Sonographer:    Wenda Low Referring Phys: [place]  1. Left ventricular ejection fraction, by estimation, is 55 to 60%. The left ventricle has normal function. The left ventricle has no regional  wall motion abnormalities. There is moderate concentric left ventricular hypertrophy. Left ventricular diastolic parameters are  indeterminate.  2. Right ventricular systolic function is normal. The right ventricular size is mildly enlarged. Tricuspid regurgitation signal is inadequate for assessing PA pressure.  3. Left atrial size was severely dilated.  4. Right atrial size was mildly dilated.  5. Left pleural effusion noted.  6. The mitral valve is degenerative. Mild mitral valve regurgitation. Severe mitral annular calcification.  7. The aortic valve is tricuspid. There is moderate calcification of the aortic valve. Aortic valve regurgitation is not visualized. Moderate to severe aortic valve stenosis. Aortic valve mean gradient measures 24.0 mmHg. Dimentionless index 0.34.  8. Aortic dilatation noted. There is mild dilatation of the ascending aorta, measuring 39 mm.  9. The inferior vena cava is dilated in size with <50% respiratory variability, suggesting right atrial pressure of 15 mmHg. Comparison(s): Prior images reviewed side by side. LVEF 55-60% range. Degenerative, calcific aortic stenosis of moderate to severe range with similar gradients as prior study. FINDINGS  Left Ventricle: Left ventricular ejection fraction, by estimation, is 55 to 60%. The left ventricle has normal function. The left ventricle has no regional wall motion abnormalities. The left ventricular internal cavity size was normal in size. There is  moderate concentric left ventricular hypertrophy. Left ventricular diastolic function could not be evaluated due to atrial fibrillation. Left ventricular diastolic parameters are indeterminate. Right Ventricle: The right ventricular size is mildly enlarged. No increase in right ventricular wall thickness. Right ventricular systolic function is normal. Tricuspid regurgitation signal is inadequate for assessing PA pressure. Left Atrium: Left atrial size was severely dilated. Right Atrium: Right  atrial size was mildly dilated. Pericardium: Left pleural effusion noted. There is no evidence of pericardial effusion. Mitral Valve: The mitral valve is degenerative in appearance. Severe mitral annular calcification. Mild mitral valve regurgitation. MV peak gradient, 15.8 mmHg. The mean mitral valve gradient is 4.0 mmHg. Tricuspid Valve: The tricuspid valve is grossly normal. Tricuspid valve regurgitation is mild. Aortic Valve: The aortic valve is tricuspid. There is moderate calcification of the aortic valve. There is mild to moderate aortic valve annular calcification. Aortic valve regurgitation is not visualized. Moderate to severe aortic stenosis is present. Aortic valve mean gradient measures 24.0 mmHg. Aortic valve peak gradient measures 42.1 mmHg. Aortic valve area, by VTI measures 1.06 cm. Pulmonic Valve: The pulmonic valve was grossly normal. Pulmonic valve regurgitation is trivial. Aorta: The aortic root is normal in size and structure and aortic dilatation noted. There is mild dilatation of the ascending aorta, measuring 39 mm. Venous: The inferior vena cava is dilated in size with less than 50% respiratory variability, suggesting right atrial pressure of 15 mmHg. IAS/Shunts: No atrial level shunt detected by color flow Doppler. Additional Comments: There is pleural effusion in the left lateral region.  LEFT VENTRICLE PLAX 2D LVIDd:         5.50 cm   Diastology LVIDs:         3.90 cm   LV e' lateral:   12.40 cm/s LV PW:         1.30 cm   LV E/e' lateral: 14.1 LV IVS:        1.40 cm LVOT diam:     2.00 cm LV SV:         79 LV SV Index:   36 LVOT Area:     3.14 cm  RIGHT VENTRICLE RV Basal diam:  4.35 cm RV Mid diam:    3.60 cm RV S prime:     11.70 cm/s TAPSE (M-mode): 2.2 cm  LEFT ATRIUM              Index        RIGHT ATRIUM           Index LA diam:        5.50 cm  2.49 cm/m   RA Area:     26.20 cm LA Vol (A2C):   154.0 ml 69.72 ml/m  RA Volume:   85.70 ml  38.80 ml/m LA Vol (A4C):   188.0 ml  85.11 ml/m LA Biplane Vol: 176.0 ml 79.68 ml/m  AORTIC VALVE                     PULMONIC VALVE AV Area (Vmax):    1.13 cm      PV Vmax:       1.22 m/s AV Area (Vmean):   1.05 cm      PV Peak grad:  6.0 mmHg AV Area (VTI):     1.06 cm AV Vmax:           324.50 cm/s AV Vmean:          222.500 cm/s AV VTI:            0.751 m AV Peak Grad:      42.1 mmHg AV Mean Grad:      24.0 mmHg LVOT Vmax:         116.33 cm/s LVOT Vmean:        74.533 cm/s LVOT VTI:          0.253 m LVOT/AV VTI ratio: 0.34  AORTA Ao Root diam: 3.60 cm Ao Asc diam:  3.90 cm MITRAL VALVE MV Area (PHT): 4.83 cm     SHUNTS MV Area VTI:   2.08 cm     Systemic VTI:  0.25 m MV Peak grad:  15.8 mmHg    Systemic Diam: 2.00 cm MV Mean grad:  4.0 mmHg MV Vmax:       1.99 m/s MV Vmean:      82.4 cm/s MV Decel Time: 157 msec MV E velocity: 175.00 cm/s Rozann Lesches MD Electronically signed by Rozann Lesches MD Signature Date/Time: 09/25/2022/12:35:26 PM    Final    DG CHEST PORT 1 VIEW  Result Date: 09/25/2022 CLINICAL DATA:  Wheezing EXAM: PORTABLE CHEST 1 VIEW COMPARISON:  09/24/2022 FINDINGS: Lung volumes are small and pulmonary insufflation has slightly diminished since prior examination. Small bilateral pleural effusions again noted. Perihilar and lower lung zone interstitial pulmonary infiltrate persists in keeping with mild cardiogenic failure. Coronary artery bypass grafting has been performed. Cardiac size is mildly enlarged, unchanged. No pneumothorax. No acute bone abnormality. IMPRESSION: 1. Stable mild cardiogenic failure with small bilateral pleural effusions. 2. Pulmonary hypoinflation. Electronically Signed   By: Fidela Salisbury M.D.   On: 09/25/2022 03:26   US Abdomen Complete  Result Date: 09/24/2022 CLINICAL DATA:  Elevated LFTs EXAM: ABDOMEN ULTRASOUND COMPLETE COMPARISON:  07/10/2005 FINDINGS: Gallbladder: Normally distended with calculi up to 21 mm diameter. No gallbladder wall thickening, pericholecystic fluid, or  sonographic Murphy sign. Common bile duct: Diameter: 3 mm, normal Liver: Echogenic parenchyma, likely fatty infiltration though this can be seen with cirrhosis and certain infiltrative disorders. No focal hepatic mass or nodularity. Portal vein is patent on color Doppler imaging with normal direction of blood flow towards the liver. IVC: Normal appearance Pancreas: Normal appearance Spleen: Normal appearance, 10.3 cm length Right Kidney: Length: 10.1 cm. Normal morphology without mass or hydronephrosis. Left Kidney:  Length: 9.6 cm. Normal morphology without mass or hydronephrosis. Abdominal aorta: Normal caliber Other findings: No free fluid.  RIGHT pleural effusion noted. IMPRESSION: Cholelithiasis without evidence of acute cholecystitis or biliary dilatation. Probable fatty infiltration of liver as above. RIGHT pleural effusion. Electronically Signed   By: Lavonia Dana M.D.   On: 09/24/2022 15:25   DG Chest Port 1 View  Result Date: 09/24/2022 CLINICAL DATA:  Shortness of breath. EXAM: PORTABLE CHEST 1 VIEW COMPARISON:  November 11, 2021. FINDINGS: Status post coronary bypass graft. Stable mild cardiomegaly with mild central pulmonary vascular congestion. Probable bibasilar pulmonary edema with small pleural effusions. Bony thorax is unremarkable. IMPRESSION: Stable cardiomegaly with mild central pulmonary vascular congestion and probable bibasilar pulmonary edema and small pleural effusions. Electronically Signed   By: Marijo Conception M.D.   On: 09/24/2022 10:52    Scheduled Meds:  apixaban  5 mg Oral BID   atorvastatin  80 mg Oral QPM   diltiazem  240 mg Oral Daily   furosemide  40 mg Intravenous Q12H   isosorbide mononitrate  30 mg Oral Daily   Living Better with Heart Failure Book   Does not apply Once   metoprolol succinate  100 mg Oral Daily   potassium chloride  30 mEq Oral BID   Continuous Infusions:   LOS: 1 day   Time spent: 36 mins  Emmali Karow Wynetta Emery, MD How to contact the [facility]  Attending or Consulting provider [place] or covering provider during after hours [place], for this patient?  Check the care team in [facility] and look for a) attending/consulting TRH provider listed and b) the [facility] team listed Log into [email or web address] and use [place]'s universal password to access. If you do not have the password, please contact the hospital operator. Locate the [facility] provider you are looking for under [facility] and page to a number that you can be directly reached. If you still have difficulty reaching the provider, please page the [facility] (Director on Call) for the Hospitalists listed on amion for assistance.  09/25/2022, 2:56 PM

## 2022-09-25 NOTE — Progress Notes (Signed)
Patient standing at bedside using urinal, removed oxygen and desatted to 82% on room air.  Breathing labored and patient restless and wanting to sit in the chair.  Advised patient to return to bed and use the oxygen.  Patient agreed.  Applied oxygen at 4L nasal cannula, raised HOB 60 degrees.  Patient recovered.  Lungs sounds reveal inspiratory wheeze on the left lung base and expiratory wheezing in bilateral low lung fields.  RT notified for breathing treatment.

## 2022-09-25 NOTE — Progress Notes (Signed)
Called to give patient PRN treatment. Patient has fine crackles throughout. States he is not feeling short of breath or having chest pain at this time and doesn't take treatments or wear O2 at home. Patient took treatment well.

## 2022-09-25 NOTE — Progress Notes (Signed)
Gave patient "living well with heart failure" packet, educated patient about heart failure and low sodium diet. Patient didn't voice any concerns.

## 2022-09-25 NOTE — TOC Initial Note (Signed)
Transition of Care [facility]) - Initial/Assessment Note    Patient Details  Name: Leonard Cisneros MRN: [id number] Date of Birth: [birth year]  Transition of Care [facility]) CM/SW Contact:    Boneta Lucks, RN Phone Number: 09/25/2022, 12:53 PM  Clinical Narrative:   Patient admitted with Acute heart failure. TOC consulted for CHF. CM spoke with his wife, she is agreeable to [facility] for education. No preferences. This is new, patient does follow low sodium, fluid restriction, or do daily weights. CHF book ordered and referral sent to [place] with Elliot Cousin for [facility], MD aware to order.                Expected Discharge Plan: [place] Barriers to Discharge: Continued Medical Work up   Patient Goals and CMS Choice Patient states their goals for this hospitalization and ongoing recovery are:: to go home. CMS Medicare.gov Compare Post Acute Care list provided to:: Patient Represenative (must comment) Choice offered to / list presented to : Spouse     Expected Discharge Plan and Services      Living arrangements for the past 2 months: [place]: RN   Date [place]: 09/25/22 Time [place]: 1152 Representative spoke with at [place]: [place] Arrangements/Services Living arrangements for the past 2 months: [place] with:: Spouse Patient language and need for interpreter reviewed:: Yes Do you feel safe going back to the place where you live?: Yes      Need for Family Participation in Patient Care: Yes (Comment) Care giver support system in place?: Yes (comment)   Criminal Activity/Legal Involvement Pertinent to Current Situation/Hospitalization: No - Comment as needed  Activities of Daily Living Home Assistive Devices/Equipment: Cane (specify quad or straight), Crutches ADL Screening (condition at time of admission) [name] cognitive ability adequate to safely complete daily activities?: Yes Is  the patient deaf or have difficulty hearing?: No Does the patient have difficulty seeing, even when wearing glasses/contacts?: No Does the patient have difficulty concentrating, remembering, or making decisions?: No Patient able to express need for assistance with ADLs?: Yes Does the patient have difficulty dressing or bathing?: No Independently performs ADLs?: Yes (appropriate for developmental age) Does the patient have difficulty walking or climbing stairs?: No Weakness of Legs: None Weakness of Arms/Hands: None  Permission Sought/Granted    Emotional Assessment     Affect (typically observed): Accepting Orientation: : Oriented to Self, Oriented to Place, Oriented to  Time, Oriented to Situation Alcohol / Substance Use: Not Applicable Psych Involvement: No (comment)  Admission diagnosis:  Acute heart failure (HCC) [I50.9] Patient Active Problem List   Diagnosis Date Noted   Acute heart failure ([place]) 09/24/2022   Hypokalemia 09/24/2022   Hyponatremia 09/24/2022   Elevated liver enzymes 09/24/2022   Hyperbilirubinemia 09/24/2022   Volume overload 09/24/2022   Acquired thrombophilia ([place]) 09/24/2022   Microcytic anemia 09/24/2022   Overweight (BMI 25.0-29.9) 04/06/2022   Cellulitis 11/12/2021   ETOH abuse 11/12/2021   Ambulatory dysfunction 11/12/2021   Severe pulmonary hypertension ([place]) 11/12/2021   Moderate aortic stenosis 11/12/2021   Heart failure ([place]) 11/12/2021   Afib ([place]) 04/09/2019   Atrial fibrillation with RVR ([place]) 04/08/2019   Acute on chronic diastolic CHF (congestive heart failure) (HCC)    Macrocytic anemia    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   Chest pain 10/22/2018   Physical deconditioning 04/26/2018   Gait instability 04/26/2018  Closed fracture of greater trochanter of right femur with routine healing 04/20/18 04/25/2018   Leg pain 04/24/2018   Fall 04/21/2018   PAD (peripheral artery disease) ([place]) 04/21/2018   Hx of adenomatous  colonic polyps 08/20/2017   Ischemic cardiomyopathy 02/02/2016   Obesity 02/02/2016   CAD S/P percutaneous coronary angioplasty 02/01/2016   Exertional angina 01/28/2016   Hyperlipidemia LDL goal <70 01/28/2016   Neuropathic pain 06/13/2015   Mild obesity 04/20/2015   Essential hypertension 04/20/2015   Unspecified hereditary and idiopathic peripheral neuropathy 04/04/2014   Foreign body (FB) in soft tissue 03/31/2014   Impaired glucose metabolism 03/31/2014   Obesity (BMI 30-39.9) 03/31/2014   Hx of CABG 01/04/2014   Rotator cuff tear, left 10/22/2013   Rotator cuff syndrome of left shoulder 10/22/2013   Screening for colon cancer 06/18/2012   High risk medication use 06/18/2012   PCP:  Alvira Monday, FNP Pharmacy:   [name] [place] [address] [place]Phone: [phone]: [phone] Social Determinants of Health (SDOH) Social History: SDOH Screenings   Food Insecurity: No Food Insecurity (09/24/2022)  Housing: Low Risk  (09/24/2022)  Transportation Needs: No Transportation Needs (09/24/2022)  Utilities: Not At Risk (09/24/2022)  Depression (PHQ2-9): Low Risk  (04/06/2022)  Financial Resource Strain: Low Risk  (04/21/2018)  Social Connections: Unknown (04/21/2018)  Stress: Stress Concern Present (04/21/2018)  Tobacco Use: Medium Risk (09/24/2022)   SDOH Interventions:   Readmission Risk Interventions    09/25/2022   12:42 PM  Readmission Risk Prevention Plan  Transportation Screening Complete  PCP or Specialist Appt within 5-7 Days Not Complete  Home Care Screening Complete  Medication Review (RN CM) Complete

## 2022-09-25 NOTE — Progress Notes (Addendum)
Rounding Note    Patient Name: Leonard Cisneros Date of Encounter: 09/25/2022  [employer] Cardiologist: Rozann Lesches, MD    Subjective   Feeling better with diuresis.  Inpatient Medications    Scheduled Meds:  apixaban  5 mg Oral BID   atorvastatin  80 mg Oral QPM   diltiazem  240 mg Oral Daily   furosemide  40 mg Intravenous Q12H   isosorbide mononitrate  30 mg Oral Daily   metoprolol succinate  100 mg Oral Daily   potassium chloride  30 mEq Oral BID    PRN Meds: acetaminophen **OR** acetaminophen, bisacodyl, fentaNYL (SUBLIMAZE) injection, ipratropium-albuterol, nitroGLYCERIN, ondansetron **OR** ondansetron (ZOFRAN) IV, oxyCODONE, traZODone   Vital Signs    Vitals:   09/25/22 0200 09/25/22 0219 09/25/22 0540 09/25/22 0700  BP:   (!) 111/54   Pulse:   75   Resp:   20   Temp:   97.6 F (36.4 C)   TempSrc:      SpO2: 96% 95% 100%   Weight:    101.2 kg  Height:        Intake/Output Summary (Last 24 hours) at 09/25/2022 1037 Last data filed at 09/25/2022 0827 Gross per 24 hour  Intake 980 ml  Output 1600 ml  Net -620 ml      09/25/2022    7:00 AM 09/24/2022    4:26 PM 09/24/2022   10:10 AM  Last 3 Weights  Weight (lbs) 223 lb 1.7 oz 225 lb 8 oz 226 lb  Weight (kg) 101.2 kg 102.286 kg 102.513 kg      Telemetry    Afib controlled rate - Personally Reviewed  ECG    I personally reviewed the ECG from 09/24/2022 which shows atrial fibrillation with lead motion artifact, diffuse nonspecific ST-T wave changes.  Physical Exam    GEN: No acute distress.   Neck: Increased JVD Cardiac: irreg irreg 3/6 systolic murmur no rubs, or gallops.  Respiratory: Clear to auscultation bilaterally. GI: Soft, nontender, non-distended  MS: sig edema; No deformity.  Labs    High Sensitivity Troponin:   Recent Labs  Lab 09/24/22 1020 09/24/22 1242  TROPONINIHS 18* 18*     Chemistry Recent Labs  Lab 09/24/22 1020 09/24/22 1431 09/25/22 0442  NA  131*  --  135  K 2.9*  --  3.6  CL 94*  --  100  CO2 26  --  28  GLUCOSE 120*  --  99  BUN 15  --  13  CREATININE 0.85  --  0.89  CALCIUM 8.4*  --  8.2*  MG  --  1.9 2.0  PROT 6.4*  --  6.0*  ALBUMIN 3.4*  --  3.1*  AST 33  --  35  ALT 29  --  29  ALKPHOS 266*  --  233*  BILITOT 3.6*  --  3.8*  GFRNONAA >60  --  >60  ANIONGAP 11  --  7    Hematology Recent Labs  Lab 09/24/22 1020 09/24/22 1431 09/25/22 0442  WBC 6.9  --  8.0  RBC 2.15* 2.02* 2.04*  HGB 8.1*  --  7.6*  HCT 24.7*  --  23.9*  MCV 114.9*  --  117.2*  MCH 37.7*  --  37.3*  MCHC 32.8  --  31.8  RDW 20.1*  --  20.6*  PLT 206  --  176   BNP Recent Labs  Lab 09/24/22 1020 09/25/22 0442  BNP 288.0* 247.0*  Radiology    DG CHEST PORT 1 VIEW  Result Date: 09/25/2022 CLINICAL DATA:  Wheezing EXAM: PORTABLE CHEST 1 VIEW COMPARISON:  09/24/2022 FINDINGS: Lung volumes are small and pulmonary insufflation has slightly diminished since prior examination. Small bilateral pleural effusions again noted. Perihilar and lower lung zone interstitial pulmonary infiltrate persists in keeping with mild cardiogenic failure. Coronary artery bypass grafting has been performed. Cardiac size is mildly enlarged, unchanged. No pneumothorax. No acute bone abnormality. IMPRESSION: 1. Stable mild cardiogenic failure with small bilateral pleural effusions. 2. Pulmonary hypoinflation. Electronically Signed   By: Fidela Salisbury M.D.   On: 09/25/2022 03:26   US Abdomen Complete  Result Date: 09/24/2022 CLINICAL DATA:  Elevated LFTs EXAM: ABDOMEN ULTRASOUND COMPLETE COMPARISON:  07/10/2005 FINDINGS: Gallbladder: Normally distended with calculi up to 21 mm diameter. No gallbladder wall thickening, pericholecystic fluid, or sonographic Murphy sign. Common bile duct: Diameter: 3 mm, normal Liver: Echogenic parenchyma, likely fatty infiltration though this can be seen with cirrhosis and certain infiltrative disorders. No focal hepatic mass or  nodularity. Portal vein is patent on color Doppler imaging with normal direction of blood flow towards the liver. IVC: Normal appearance Pancreas: Normal appearance Spleen: Normal appearance, 10.3 cm length Right Kidney: Length: 10.1 cm. Normal morphology without mass or hydronephrosis. Left Kidney: Length: 9.6 cm. Normal morphology without mass or hydronephrosis. Abdominal aorta: Normal caliber Other findings: No free fluid.  RIGHT pleural effusion noted. IMPRESSION: Cholelithiasis without evidence of acute cholecystitis or biliary dilatation. Probable fatty infiltration of liver as above. RIGHT pleural effusion. Electronically Signed   By: Lavonia Dana M.D.   On: 09/24/2022 15:25   DG Chest Port 1 View  Result Date: 09/24/2022 CLINICAL DATA:  Shortness of breath. EXAM: PORTABLE CHEST 1 VIEW COMPARISON:  November 11, 2021. FINDINGS: Status post coronary bypass graft. Stable mild cardiomegaly with mild central pulmonary vascular congestion. Probable bibasilar pulmonary edema with small pleural effusions. Bony thorax is unremarkable. IMPRESSION: Stable cardiomegaly with mild central pulmonary vascular congestion and probable bibasilar pulmonary edema and small pleural effusions. Electronically Signed   By: Marijo Conception M.D.   On: 09/24/2022 10:52     Assessment & Plan    Acute on chronic HFpEF - 11/2021 echo: LVEF 55-60%, no WMAs, indet diastolic fxn, normal RV, severe pulm HTN PASP 69.8, severe LAE, mod AS mean grad 27 AVA VTI 1.44 - bnp 247, CXR with congestion - did not improve with higher tosremide dosing, was taking torsemide '20mg'$  qid.    - started on IV lasix '40mg'$  bid,I/O's negative 620 since admission? Accuracy. Patient says he lost 4 lbs overnight and edema better  - f/u reds vest measurement 31.13 - f/u repeat echo-pending  - consider SGLT2i   2. CAD - no active issues - f/u echo   3. PAF - continue eliquis,rate control   4. Moderate AS - continue to monitor, f/u repeat echo  5.  Macrocytic anemia-Hgb down 7.6 primary team working up.    For questions or updates, please contact [place] Please consult [email or web address] for contact info under        Signed, Ermalinda Barrios, PA-C  09/25/2022, 10:37 AM      Attending note:  Patient seen and examined.  I reviewed the consultation by Dr. Harl Bowie and discussed the case with Ms. Bonnell Public PA-C.  Leonard Cisneros presents with shortness of breath and fluid overload, at least a 10 pound weight gain over the last few weeks.  He has a history of aortic  stenosis that was moderate as of echocardiogram in March 2023, mean gradient 27 mmHg and dimensionless index 0.31.  LVEF was 55 to 60% at that point.  Follow-up echocardiogram pending.  He was seen by Ms. Arlington Calix NP last week in our office, I reviewed the note.  He had also been experiencing some recent intermittent chest discomfort, improved with Imdur.  Myoview from March 2023 was low risk without active ischemia.   States that he feels better today, is diuresing with IV Lasix.  Still not at baseline and echocardiogram is pending.  Renal function stable.  Plan to continue IV diuresis, we will follow-up echocardiogram results and determine next step in workup.  He had been scheduled for a follow-up Myoview this coming Monday, would most likely hold off on that for now.  Satira Sark, M.D., F.A.C.C.

## 2022-09-25 NOTE — Progress Notes (Addendum)
Pt has had no acute events this shift. Pt is noncompliant with intake and output, pt educated on 1500 fluid restriction. No c/o pain or discomfort at this time. Will continue to monitor.

## 2022-09-25 NOTE — Progress Notes (Signed)
*  PRELIMINARY RESULTS* Echocardiogram 2D Echocardiogram has been performed.  Leonard Cisneros 09/25/2022, 12:24 PM

## 2022-09-26 DIAGNOSIS — I350 Nonrheumatic aortic (valve) stenosis: Secondary | ICD-10-CM | POA: Diagnosis not present

## 2022-09-26 DIAGNOSIS — I200 Unstable angina: Secondary | ICD-10-CM

## 2022-09-26 DIAGNOSIS — F1010 Alcohol abuse, uncomplicated: Secondary | ICD-10-CM | POA: Diagnosis not present

## 2022-09-26 DIAGNOSIS — I5033 Acute on chronic diastolic (congestive) heart failure: Secondary | ICD-10-CM | POA: Diagnosis not present

## 2022-09-26 LAB — COMPREHENSIVE METABOLIC PANEL
ALT: 33 U/L (ref 0–44)
AST: 38 U/L (ref 15–41)
Albumin: 3.3 g/dL — ABNORMAL LOW (ref 3.5–5.0)
Alkaline Phosphatase: 250 U/L — ABNORMAL HIGH (ref 38–126)
Anion gap: 7 (ref 5–15)
BUN: 14 mg/dL (ref 8–23)
CO2: 29 mmol/L (ref 22–32)
Calcium: 8.5 mg/dL — ABNORMAL LOW (ref 8.9–10.3)
Chloride: 99 mmol/L (ref 98–111)
Creatinine, Ser: 0.94 mg/dL (ref 0.61–1.24)
GFR, Estimated: >60 mL/min (ref >60)
Glucose, Bld: 121 mg/dL — ABNORMAL HIGH (ref 70–99)
Potassium: 4 mmol/L (ref 3.5–5.1)
Sodium: 135 mmol/L (ref 135–145)
Total Bilirubin: 3.7 mg/dL — ABNORMAL HIGH (ref 0.3–1.2)
Total Protein: 6.3 g/dL — ABNORMAL LOW (ref 6.5–8.1)

## 2022-09-26 LAB — CBC
HCT: 24.8 % — ABNORMAL LOW (ref 39.0–52.0)
Hemoglobin: 8 g/dL — ABNORMAL LOW (ref 13.0–17.0)
MCH: 37.9 pg — ABNORMAL HIGH (ref 26.0–34.0)
MCHC: 32.3 g/dL (ref 30.0–36.0)
MCV: 117.5 fL — ABNORMAL HIGH (ref 80.0–100.0)
Platelets: 182 10*3/uL (ref 150–400)
RBC: 2.11 MIL/uL — ABNORMAL LOW (ref 4.22–5.81)
RDW: 20.6 % — ABNORMAL HIGH (ref 11.5–15.5)
WBC: 8.6 10*3/uL (ref 4.0–10.5)
nRBC: 0.2 % (ref 0.0–0.2)

## 2022-09-26 MED ORDER — SODIUM CHLORIDE 0.9% FLUSH
3.0000 mL | Freq: Two times a day (BID) | INTRAVENOUS | Status: DC
  Administered 2022-09-26 – 2022-09-30 (×7): 3 mL via INTRAVENOUS

## 2022-09-26 NOTE — Progress Notes (Signed)
    The patient understands that risks included but are not limited to stroke (1 in 1000), death (1 in 46), kidney failure [usually temporary] (1 in 500), bleeding (1 in 200), allergic reaction [possibly serious] (1 in 200).    Discussed with pt and his wife.  They are agreeable to proceed and cath with with Dr. Claiborne Billings 09/27/22 - pt knows Dr. Claiborne Billings well.   Cecilie Kicks, FNP-C [facility]  [phone] or after 5pm and on weekends call [phone] 09/26/2022.now

## 2022-09-26 NOTE — Progress Notes (Signed)
Progress Note  Patient Name: Leonard Cisneros Date of Encounter: 09/26/2022  Primary Cardiologist: Rozann Lesches, MD  Subjective   Sitting in bedside chair.  Reports having some chest tightness last night similar to the symptoms he has been experiencing in the last few weeks despite addition of Imdur as an outpatient.  Breathing generally improving with diuresis, legs feel less tight.  Inpatient Medications    Scheduled Meds:  atorvastatin  80 mg Oral QPM   diltiazem  240 mg Oral Daily   furosemide  40 mg Intravenous Q12H   isosorbide mononitrate  30 mg Oral Daily   metoprolol succinate  100 mg Oral Daily   potassium chloride  30 mEq Oral BID    PRN Meds: acetaminophen **OR** acetaminophen, bisacodyl, fentaNYL (SUBLIMAZE) injection, ipratropium-albuterol, nitroGLYCERIN, ondansetron **OR** ondansetron (ZOFRAN) IV, oxyCODONE, traZODone   Vital Signs    Vitals:   09/25/22 1340 09/25/22 2011 09/26/22 0514 09/26/22 0629  BP: 109/64 (!) 106/53 (!) 109/55   Pulse: 69 71 83   Resp: '19 18 20   '$ Temp: 97.9 F (36.6 C) 98.2 F (36.8 C) 97.7 F (36.5 C)   TempSrc: Oral Oral Oral   SpO2: 100% 98% (!) 85%   Weight:    100.8 kg  Height:        Intake/Output Summary (Last 24 hours) at 09/26/2022 1033 Last data filed at 09/25/2022 1504 Gross per 24 hour  Intake 560 ml  Output --  Net 560 ml   Filed Weights   09/24/22 1626 09/25/22 0700 09/26/22 0629  Weight: 102.3 kg 101.2 kg 100.8 kg    Telemetry    Atrial fibrillation, rate controlled.  Personally reviewed.  ECG    An ECG dated 09/24/2022 was personally reviewed today and demonstrated:  Atrial fibrillation with repolarization abnormalities, low voltage in the limb leads.  Physical Exam   GEN: No acute distress.   Neck: No JVD. Cardiac: Irregularly irregular, 2/6 to 3/6 systolic murmur, no gallop. Respiratory: Nonlabored.  Decreased breath sounds at the bases. GI: Soft, nontender, bowel sounds present. MS:  Improving leg edema. Neuro:  Nonfocal. Psych: Alert and oriented x 3. Normal affect.  Labs    Chemistry Recent Labs  Lab 09/24/22 1020 09/25/22 0442 09/26/22 0500  NA 131* 135 135  K 2.9* 3.6 4.0  CL 94* 100 99  CO2 '26 28 29  '$ GLUCOSE 120* 99 121*  BUN '15 13 14  '$ CREATININE 0.85 0.89 0.94  CALCIUM 8.4* 8.2* 8.5*  PROT 6.4* 6.0* 6.3*  ALBUMIN 3.4* 3.1* 3.3*  AST 33 35 38  ALT 29 29 33  ALKPHOS 266* 233* 250*  BILITOT 3.6* 3.8* 3.7*  GFRNONAA >60 >60 >60  ANIONGAP '11 7 7     '$ Hematology Recent Labs  Lab 09/24/22 1020 09/24/22 1431 09/25/22 0442 09/26/22 0500  WBC 6.9  --  8.0 8.6  RBC 2.15* 2.02* 2.04* 2.11*  HGB 8.1*  --  7.6* 8.0*  HCT 24.7*  --  23.9* 24.8*  MCV 114.9*  --  117.2* 117.5*  MCH 37.7*  --  37.3* 37.9*  MCHC 32.8  --  31.8 32.3  RDW 20.1*  --  20.6* 20.6*  PLT 206  --  176 182    Cardiac Enzymes Recent Labs  Lab 09/24/22 1020 09/24/22 1242  TROPONINIHS 18* 18*    BNP Recent Labs  Lab 09/24/22 1020 09/25/22 0442  BNP 288.0* 247.0*     Radiology    ECHOCARDIOGRAM COMPLETE  Result Date:  09/25/2022    ECHOCARDIOGRAM REPORT   Patient Name:   Leonard Cisneros Date of Exam: 09/25/2022 Medical Rec #:  [id number]        Height:       71.0 in Accession #:    [id number]       Weight:       223.1 lb Date of Birth:  06/05/1941       BSA:          2.209 m Patient Age:    46 years         BP:           111/54 mmHg Patient Gender: M                HR:           74 bpm. Exam Location:  [facility] Procedure: 2D Echo, Cardiac Doppler and Color Doppler Indications:    CHF  History:        Patient has prior history of Echocardiogram examinations, most                 recent 11/12/2021. CHF and Cardiomyopathy, CAD, Prior CABG, PAD                 and Pulmonary HTN, Arrythmias:Atrial Fibrillation,                 Signs/Symptoms:Chest Pain; Risk Factors:Hypertension and                 Dyslipidemia. ETOH abuse.  Sonographer:    Wenda Low Referring Phys:  [place]  1. Left ventricular ejection fraction, by estimation, is 55 to 60%. The left ventricle has normal function. The left ventricle has no regional wall motion abnormalities. There is moderate concentric left ventricular hypertrophy. Left ventricular diastolic parameters are indeterminate.  2. Right ventricular systolic function is normal. The right ventricular size is mildly enlarged. Tricuspid regurgitation signal is inadequate for assessing PA pressure.  3. Left atrial size was severely dilated.  4. Right atrial size was mildly dilated.  5. Left pleural effusion noted.  6. The mitral valve is degenerative. Mild mitral valve regurgitation. Severe mitral annular calcification.  7. The aortic valve is tricuspid. There is moderate calcification of the aortic valve. Aortic valve regurgitation is not visualized. Moderate to severe aortic valve stenosis. Aortic valve mean gradient measures 24.0 mmHg. Dimentionless index 0.34.  8. Aortic dilatation noted. There is mild dilatation of the ascending aorta, measuring 39 mm.  9. The inferior vena cava is dilated in size with <50% respiratory variability, suggesting right atrial pressure of 15 mmHg. Comparison(s): Prior images reviewed side by side. LVEF 55-60% range. Degenerative, calcific aortic stenosis of moderate to severe range with similar gradients as prior study. FINDINGS  Left Ventricle: Left ventricular ejection fraction, by estimation, is 55 to 60%. The left ventricle has normal function. The left ventricle has no regional wall motion abnormalities. The left ventricular internal cavity size was normal in size. There is  moderate concentric left ventricular hypertrophy. Left ventricular diastolic function could not be evaluated due to atrial fibrillation. Left ventricular diastolic parameters are indeterminate. Right Ventricle: The right ventricular size is mildly enlarged. No increase in right ventricular wall thickness. Right  ventricular systolic function is normal. Tricuspid regurgitation signal is inadequate for assessing PA pressure. Left Atrium: Left atrial size was severely dilated. Right Atrium: Right atrial size was mildly dilated. Pericardium: Left pleural effusion noted. There is no  evidence of pericardial effusion. Mitral Valve: The mitral valve is degenerative in appearance. Severe mitral annular calcification. Mild mitral valve regurgitation. MV peak gradient, 15.8 mmHg. The mean mitral valve gradient is 4.0 mmHg. Tricuspid Valve: The tricuspid valve is grossly normal. Tricuspid valve regurgitation is mild. Aortic Valve: The aortic valve is tricuspid. There is moderate calcification of the aortic valve. There is mild to moderate aortic valve annular calcification. Aortic valve regurgitation is not visualized. Moderate to severe aortic stenosis is present. Aortic valve mean gradient measures 24.0 mmHg. Aortic valve peak gradient measures 42.1 mmHg. Aortic valve area, by VTI measures 1.06 cm. Pulmonic Valve: The pulmonic valve was grossly normal. Pulmonic valve regurgitation is trivial. Aorta: The aortic root is normal in size and structure and aortic dilatation noted. There is mild dilatation of the ascending aorta, measuring 39 mm. Venous: The inferior vena cava is dilated in size with less than 50% respiratory variability, suggesting right atrial pressure of 15 mmHg. IAS/Shunts: No atrial level shunt detected by color flow Doppler. Additional Comments: There is pleural effusion in the left lateral region.  LEFT VENTRICLE PLAX 2D LVIDd:         5.50 cm   Diastology LVIDs:         3.90 cm   LV e' lateral:   12.40 cm/s LV PW:         1.30 cm   LV E/e' lateral: 14.1 LV IVS:        1.40 cm LVOT diam:     2.00 cm LV SV:         79 LV SV Index:   36 LVOT Area:     3.14 cm  RIGHT VENTRICLE RV Basal diam:  4.35 cm RV Mid diam:    3.60 cm RV S prime:     11.70 cm/s TAPSE (M-mode): 2.2 cm LEFT ATRIUM              Index        RIGHT  ATRIUM           Index LA diam:        5.50 cm  2.49 cm/m   RA Area:     26.20 cm LA Vol (A2C):   154.0 ml 69.72 ml/m  RA Volume:   85.70 ml  38.80 ml/m LA Vol (A4C):   188.0 ml 85.11 ml/m LA Biplane Vol: 176.0 ml 79.68 ml/m  AORTIC VALVE                     PULMONIC VALVE AV Area (Vmax):    1.13 cm      PV Vmax:       1.22 m/s AV Area (Vmean):   1.05 cm      PV Peak grad:  6.0 mmHg AV Area (VTI):     1.06 cm AV Vmax:           324.50 cm/s AV Vmean:          222.500 cm/s AV VTI:            0.751 m AV Peak Grad:      42.1 mmHg AV Mean Grad:      24.0 mmHg LVOT Vmax:         116.33 cm/s LVOT Vmean:        74.533 cm/s LVOT VTI:          0.253 m LVOT/AV VTI ratio: 0.34  AORTA Ao Root diam: 3.60 cm Ao Asc  diam:  3.90 cm MITRAL VALVE MV Area (PHT): 4.83 cm     SHUNTS MV Area VTI:   2.08 cm     Systemic VTI:  0.25 m MV Peak grad:  15.8 mmHg    Systemic Diam: 2.00 cm MV Mean grad:  4.0 mmHg MV Vmax:       1.99 m/s MV Vmean:      82.4 cm/s MV Decel Time: 157 msec MV E velocity: 175.00 cm/s Rozann Lesches MD Electronically signed by Rozann Lesches MD Signature Date/Time: 09/25/2022/12:35:26 PM    Final    DG CHEST PORT 1 VIEW  Result Date: 09/25/2022 CLINICAL DATA:  Wheezing EXAM: PORTABLE CHEST 1 VIEW COMPARISON:  09/24/2022 FINDINGS: Lung volumes are small and pulmonary insufflation has slightly diminished since prior examination. Small bilateral pleural effusions again noted. Perihilar and lower lung zone interstitial pulmonary infiltrate persists in keeping with mild cardiogenic failure. Coronary artery bypass grafting has been performed. Cardiac size is mildly enlarged, unchanged. No pneumothorax. No acute bone abnormality. IMPRESSION: 1. Stable mild cardiogenic failure with small bilateral pleural effusions. 2. Pulmonary hypoinflation. Electronically Signed   By: Fidela Salisbury M.D.   On: 09/25/2022 03:26   US Abdomen Complete  Result Date: 09/24/2022 CLINICAL DATA:  Elevated LFTs EXAM: ABDOMEN  ULTRASOUND COMPLETE COMPARISON:  07/10/2005 FINDINGS: Gallbladder: Normally distended with calculi up to 21 mm diameter. No gallbladder wall thickening, pericholecystic fluid, or sonographic Murphy sign. Common bile duct: Diameter: 3 mm, normal Liver: Echogenic parenchyma, likely fatty infiltration though this can be seen with cirrhosis and certain infiltrative disorders. No focal hepatic mass or nodularity. Portal vein is patent on color Doppler imaging with normal direction of blood flow towards the liver. IVC: Normal appearance Pancreas: Normal appearance Spleen: Normal appearance, 10.3 cm length Right Kidney: Length: 10.1 cm. Normal morphology without mass or hydronephrosis. Left Kidney: Length: 9.6 cm. Normal morphology without mass or hydronephrosis. Abdominal aorta: Normal caliber Other findings: No free fluid.  RIGHT pleural effusion noted. IMPRESSION: Cholelithiasis without evidence of acute cholecystitis or biliary dilatation. Probable fatty infiltration of liver as above. RIGHT pleural effusion. Electronically Signed   By: Lavonia Dana M.D.   On: 09/24/2022 15:25   DG Chest Port 1 View  Result Date: 09/24/2022 CLINICAL DATA:  Shortness of breath. EXAM: PORTABLE CHEST 1 VIEW COMPARISON:  November 11, 2021. FINDINGS: Status post coronary bypass graft. Stable mild cardiomegaly with mild central pulmonary vascular congestion. Probable bibasilar pulmonary edema with small pleural effusions. Bony thorax is unremarkable. IMPRESSION: Stable cardiomegaly with mild central pulmonary vascular congestion and probable bibasilar pulmonary edema and small pleural effusions. Electronically Signed   By: Marijo Conception M.D.   On: 09/24/2022 10:52    Assessment & Plan    1.  Acute on chronic HFpEF, LVEF 55 to 60% with moderate LVH.  Intake and output incomplete but patient diuresing and feels better subjectively in terms of leg edema and shortness of breath.  Not at baseline weight as yet.  Currently on Lasix 40 mg IV  twice daily with potassium supplement.  2.  Moderate to severe calcific, degenerative aortic stenosis, mean AV gradient 24 mmHg and dimensionless index 0.34.  Given heart failure symptoms and intermittent angina, question whether severity is underestimated (could be normal flow/low gradient).  3.  Recurring episodes of chest discomfort concerning for angina, although no clear evidence of ACS by cardiac enzymes.  He has known CAD status post CABG in 2003 and subsequent graft intervention in 2017.  Not  on aspirin given use of Eliquis.  Also on Imdur and Lipitor.  4.  Persistent atrial fibrillation with CHA2DS2-VASc score of 5.  Has been on Eliquis for stroke prophylaxis, also Cardizem CD and Toprol-XL for heart rate control.  5.  Macrocytic anemia, hemoglobin 8.0 and MCV 118.  B12 normal.  No overt bleeding noted.  Workup per primary team.  Situation reviewed with patient.  Given recurring episodes of chest pain and heart failure symptoms in the setting of at least moderate to severe aortic stenosis and previously documented CAD status post CABG, plan will be temporary hold on Eliquis and proceed with right and left heart catheterization tomorrow.  This will provide both revascularization information and also additional assessment of aortic stenosis in case he is a candidate for TAVR.  Continue IV Lasix today and hold tomorrow morning.  No other change in present cardiac regimen.  Signed, Rozann Lesches, MD  09/26/2022, 10:33 AM

## 2022-09-26 NOTE — Progress Notes (Signed)
PROGRESS NOTE  Leonard Cisneros ZOX:[id number] DOB: [birth year] DOA: 09/24/2022 PCP: Alvira Monday, FNP  Brief History:  82 year old gentleman with history of a flutter, ischemic cardiomyopathy status post CABG x 2, coronary artery disease, HFpEF, hypertension, hyperlipidemia, moderate aortic stenosis, permanent atrial fibrillation on apixaban reports that for the past 12 days he has had shortness of breath and increasing edema in the legs.  He has been taking Demadex and increasing the doses at home but it has not been able to control his symptoms although he has had an increase in urination since increasing the doses.  He reports having a cough, diminished appetite, weight gain and intermittent chest pain episodes.  He was evaluated in the emergency department and noted to have a hemoglobin down to 8.1, potassium down to 2.9, bilirubin elevated at 3.6, sodium down to 131, BNP 288, troponin 18, 18. Chest xray with findings of pulmonary edema.  He was started on IV lasix and admission requested for further diuresis and cardiology consultation.    Assessment/Plan: Acute on chronic HFpEF - pt has failed outpatient management with increasing torsemide doses at home - continue IV lasix  - monitor daily weight, intake/output, RedS vest; electrolytes  - 09/25/22 Echo--EF 55-60%, no WMA, normal RVF; mod to severe AS - daily BMP, Mg - 2 gm sodium restricted, fluid restricted diet - 09/26/22--discussed with cardiology--Dr. McDowell>>plan for R & L heart cath 09/27/22 - Hx CABG 2003, 01/2016 s/p BMS to SVG - ramus intermediate, patent LIMA-mLAD, patent RIMA-RCA    Moderate to severe aortic stenosis -mean AV gradient 24 mmHg and dimensionless index 0.34.  - 09/26/22--discussed with cardiology--Dr. McDowell>>plan for R & L heart cath 09/27/22   Persistent Atrial Fibrillation  - he is on apixaban--holding for heart cath - metoprolol XL 100 mg and diltiazem XR 240 mg for heart rate control     Hyperlipidemia - resume home atorvastatin 80 mg QHS    Hyperbilirubinemia  - mostly indirect - check LDH and haptoglobin - abd US--no biliary ductal dilatation - suspect Gilbert's - Hgb remains stable this hospitalization - hx of Etoh abuse   Hyponatremia -due to decompensated CHF -improving with diuresis  Hypokalemia -repleting -mag 2.0   Macrocytic anemia - follow up stool guaiac testing  - B12 553 - folate 8.6 - iron saturation 56%, ferritin 310 - Hgb stable since admission - hx of Etoh abuse  Hypertension -metoprolol succinate and diltiazem CD   Acquired thrombophilia - we are in process to hemoccult stools - continue apixaban for now  Obesity -BMI 30.99 -lifestyle modification         Family Communication:  spouse updated 09/26/22  Consultants:  cardiology  Code Status:  FULL   DVT Prophylaxis:  apixaban   Procedures: As Listed in Progress Note Above  Antibiotics: None        Subjective: Overall breathing better, but still has intermittent exertional cp and sob.  Denies f/c, cough, n/v/d, abd pain  Objective: Vitals:   09/25/22 2011 09/26/22 0514 09/26/22 0629 09/26/22 1400  BP: (!) 106/53 (!) 109/55  (!) 112/47  Pulse: 71 83  73  Resp: '18 20  18  '$ Temp: 98.2 F (36.8 C) 97.7 F (36.5 C)  97.8 F (36.6 C)  TempSrc: Oral Oral  Oral  SpO2: 98% (!) 85%  95%  Weight:   100.8 kg   Height:        Intake/Output Summary (Last 24 hours) at 09/26/2022  1551 Last data filed at 09/26/2022 1300 Gross per 24 hour  Intake 480 ml  Output --  Net 480 ml   Weight change: -1.713 kg Exam:  General:  Pt is alert, follows commands appropriately, not in acute distress HEENT: No icterus, No thrush, No neck mass, [place]/AT Cardiovascular: IRRR, S1/S2, no rubs, no gallops Respiratory: CTA bilaterally, no wheezing, no crackles, no rhonchi Abdomen: Soft/+BS, non tender, non distended, no guarding Extremities: trace LE edema, No lymphangitis, No  petechiae, No rashes, no synovitis   Data Reviewed: I have personally reviewed following labs and imaging studies Basic Metabolic Panel: Recent Labs  Lab 09/24/22 1020 09/24/22 1431 09/25/22 0442 09/26/22 0500  NA 131*  --  135 135  K 2.9*  --  3.6 4.0  CL 94*  --  100 99  CO2 26  --  28 29  GLUCOSE 120*  --  99 121*  BUN 15  --  13 14  CREATININE 0.85  --  0.89 0.94  CALCIUM 8.4*  --  8.2* 8.5*  MG  --  1.9 2.0  --    Liver Function Tests: Recent Labs  Lab 09/24/22 1020 09/25/22 0442 09/26/22 0500  AST 33 35 38  ALT 29 29 33  ALKPHOS 266* 233* 250*  BILITOT 3.6* 3.8* 3.7*  PROT 6.4* 6.0* 6.3*  ALBUMIN 3.4* 3.1* 3.3*   No results for input(s): "LIPASE", "AMYLASE" in the last 168 hours. No results for input(s): "AMMONIA" in the last 168 hours. Coagulation Profile: No results for input(s): "INR", "PROTIME" in the last 168 hours. CBC: Recent Labs  Lab 09/24/22 1020 09/25/22 0442 09/26/22 0500  WBC 6.9 8.0 8.6  HGB 8.1* 7.6* 8.0*  HCT 24.7* 23.9* 24.8*  MCV 114.9* 117.2* 117.5*  PLT 206 176 182   Cardiac Enzymes: No results for input(s): "CKTOTAL", "CKMB", "CKMBINDEX", "TROPONINI" in the last 168 hours. BNP: Invalid input(s): "POCBNP" CBG: No results for input(s): "GLUCAP" in the last 168 hours. HbA1C: No results for input(s): "HGBA1C" in the last 72 hours. Urine analysis:    Component Value Date/Time   COLORURINE YELLOW 04/09/2019 1105   APPEARANCEUR CLEAR 04/09/2019 1105   LABSPEC 1.011 04/09/2019 1105   PHURINE 5.0 04/09/2019 1105   GLUCOSEU NEGATIVE 04/09/2019 1105   HGBUR NEGATIVE 04/09/2019 1105   [place] 04/09/2019 1105   KETONESUR NEGATIVE 04/09/2019 1105   PROTEINUR NEGATIVE 04/09/2019 1105   UROBILINOGEN 0.2 11/28/2010 0915   NITRITE NEGATIVE 04/09/2019 1105   LEUKOCYTESUR NEGATIVE 04/09/2019 1105   Sepsis Labs: '@LABRCNTIP'$ (procalcitonin:4,lacticidven:4) )No results found for this or any previous visit (from the past 240  hour(s)).   Scheduled Meds:  atorvastatin  80 mg Oral QPM   diltiazem  240 mg Oral Daily   furosemide  40 mg Intravenous Q12H   isosorbide mononitrate  30 mg Oral Daily   metoprolol succinate  100 mg Oral Daily   potassium chloride  30 mEq Oral BID   sodium chloride flush  3 mL Intravenous Q12H   Continuous Infusions:  Procedures/Studies: ECHOCARDIOGRAM COMPLETE  Result Date: 09/25/2022    ECHOCARDIOGRAM REPORT   Patient Name:   Leonard Cisneros Date of Exam: 09/25/2022 Medical Rec #:  [id number]        Height:       71.0 in Accession #:    [id number]       Weight:       223.1 lb Date of Birth:  09-May-1941       BSA:  2.209 m Patient Age:    39 years         BP:           111/54 mmHg Patient Gender: M                HR:           74 bpm. Exam Location:  [facility] Procedure: 2D Echo, Cardiac Doppler and Color Doppler Indications:    CHF  History:        Patient has prior history of Echocardiogram examinations, most                 recent 11/12/2021. CHF and Cardiomyopathy, CAD, Prior CABG, PAD                 and Pulmonary HTN, Arrythmias:Atrial Fibrillation,                 Signs/Symptoms:Chest Pain; Risk Factors:Hypertension and                 Dyslipidemia. ETOH abuse.  Sonographer:    Wenda Low Referring Phys: [place]  1. Left ventricular ejection fraction, by estimation, is 55 to 60%. The left ventricle has normal function. The left ventricle has no regional wall motion abnormalities. There is moderate concentric left ventricular hypertrophy. Left ventricular diastolic parameters are indeterminate.  2. Right ventricular systolic function is normal. The right ventricular size is mildly enlarged. Tricuspid regurgitation signal is inadequate for assessing PA pressure.  3. Left atrial size was severely dilated.  4. Right atrial size was mildly dilated.  5. Left pleural effusion noted.  6. The mitral valve is degenerative. Mild mitral valve regurgitation.  Severe mitral annular calcification.  7. The aortic valve is tricuspid. There is moderate calcification of the aortic valve. Aortic valve regurgitation is not visualized. Moderate to severe aortic valve stenosis. Aortic valve mean gradient measures 24.0 mmHg. Dimentionless index 0.34.  8. Aortic dilatation noted. There is mild dilatation of the ascending aorta, measuring 39 mm.  9. The inferior vena cava is dilated in size with <50% respiratory variability, suggesting right atrial pressure of 15 mmHg. Comparison(s): Prior images reviewed side by side. LVEF 55-60% range. Degenerative, calcific aortic stenosis of moderate to severe range with similar gradients as prior study. FINDINGS  Left Ventricle: Left ventricular ejection fraction, by estimation, is 55 to 60%. The left ventricle has normal function. The left ventricle has no regional wall motion abnormalities. The left ventricular internal cavity size was normal in size. There is  moderate concentric left ventricular hypertrophy. Left ventricular diastolic function could not be evaluated due to atrial fibrillation. Left ventricular diastolic parameters are indeterminate. Right Ventricle: The right ventricular size is mildly enlarged. No increase in right ventricular wall thickness. Right ventricular systolic function is normal. Tricuspid regurgitation signal is inadequate for assessing PA pressure. Left Atrium: Left atrial size was severely dilated. Right Atrium: Right atrial size was mildly dilated. Pericardium: Left pleural effusion noted. There is no evidence of pericardial effusion. Mitral Valve: The mitral valve is degenerative in appearance. Severe mitral annular calcification. Mild mitral valve regurgitation. MV peak gradient, 15.8 mmHg. The mean mitral valve gradient is 4.0 mmHg. Tricuspid Valve: The tricuspid valve is grossly normal. Tricuspid valve regurgitation is mild. Aortic Valve: The aortic valve is tricuspid. There is moderate calcification of the  aortic valve. There is mild to moderate aortic valve annular calcification. Aortic valve regurgitation is not visualized. Moderate to severe aortic stenosis  is present. Aortic valve mean gradient measures 24.0 mmHg. Aortic valve peak gradient measures 42.1 mmHg. Aortic valve area, by VTI measures 1.06 cm. Pulmonic Valve: The pulmonic valve was grossly normal. Pulmonic valve regurgitation is trivial. Aorta: The aortic root is normal in size and structure and aortic dilatation noted. There is mild dilatation of the ascending aorta, measuring 39 mm. Venous: The inferior vena cava is dilated in size with less than 50% respiratory variability, suggesting right atrial pressure of 15 mmHg. IAS/Shunts: No atrial level shunt detected by color flow Doppler. Additional Comments: There is pleural effusion in the left lateral region.  LEFT VENTRICLE PLAX 2D LVIDd:         5.50 cm   Diastology LVIDs:         3.90 cm   LV e' lateral:   12.40 cm/s LV PW:         1.30 cm   LV E/e' lateral: 14.1 LV IVS:        1.40 cm LVOT diam:     2.00 cm LV SV:         79 LV SV Index:   36 LVOT Area:     3.14 cm  RIGHT VENTRICLE RV Basal diam:  4.35 cm RV Mid diam:    3.60 cm RV S prime:     11.70 cm/s TAPSE (M-mode): 2.2 cm LEFT ATRIUM              Index        RIGHT ATRIUM           Index LA diam:        5.50 cm  2.49 cm/m   RA Area:     26.20 cm LA Vol (A2C):   154.0 ml 69.72 ml/m  RA Volume:   85.70 ml  38.80 ml/m LA Vol (A4C):   188.0 ml 85.11 ml/m LA Biplane Vol: 176.0 ml 79.68 ml/m  AORTIC VALVE                     PULMONIC VALVE AV Area (Vmax):    1.13 cm      PV Vmax:       1.22 m/s AV Area (Vmean):   1.05 cm      PV Peak grad:  6.0 mmHg AV Area (VTI):     1.06 cm AV Vmax:           324.50 cm/s AV Vmean:          222.500 cm/s AV VTI:            0.751 m AV Peak Grad:      42.1 mmHg AV Mean Grad:      24.0 mmHg LVOT Vmax:         116.33 cm/s LVOT Vmean:        74.533 cm/s LVOT VTI:          0.253 m LVOT/AV VTI ratio: 0.34  AORTA  Ao Root diam: 3.60 cm Ao Asc diam:  3.90 cm MITRAL VALVE MV Area (PHT): 4.83 cm     SHUNTS MV Area VTI:   2.08 cm     Systemic VTI:  0.25 m MV Peak grad:  15.8 mmHg    Systemic Diam: 2.00 cm MV Mean grad:  4.0 mmHg MV Vmax:       1.99 m/s MV Vmean:      82.4 cm/s MV Decel Time: 157 msec MV E velocity: 175.00 cm/s Rozann Lesches  MD Electronically signed by Rozann Lesches MD Signature Date/Time: 09/25/2022/12:35:26 PM    Final    DG CHEST PORT 1 VIEW  Result Date: 09/25/2022 CLINICAL DATA:  Wheezing EXAM: PORTABLE CHEST 1 VIEW COMPARISON:  09/24/2022 FINDINGS: Lung volumes are small and pulmonary insufflation has slightly diminished since prior examination. Small bilateral pleural effusions again noted. Perihilar and lower lung zone interstitial pulmonary infiltrate persists in keeping with mild cardiogenic failure. Coronary artery bypass grafting has been performed. Cardiac size is mildly enlarged, unchanged. No pneumothorax. No acute bone abnormality. IMPRESSION: 1. Stable mild cardiogenic failure with small bilateral pleural effusions. 2. Pulmonary hypoinflation. Electronically Signed   By: Fidela Salisbury M.D.   On: 09/25/2022 03:26   US Abdomen Complete  Result Date: 09/24/2022 CLINICAL DATA:  Elevated LFTs EXAM: ABDOMEN ULTRASOUND COMPLETE COMPARISON:  07/10/2005 FINDINGS: Gallbladder: Normally distended with calculi up to 21 mm diameter. No gallbladder wall thickening, pericholecystic fluid, or sonographic Murphy sign. Common bile duct: Diameter: 3 mm, normal Liver: Echogenic parenchyma, likely fatty infiltration though this can be seen with cirrhosis and certain infiltrative disorders. No focal hepatic mass or nodularity. Portal vein is patent on color Doppler imaging with normal direction of blood flow towards the liver. IVC: Normal appearance Pancreas: Normal appearance Spleen: Normal appearance, 10.3 cm length Right Kidney: Length: 10.1 cm. Normal morphology without mass or hydronephrosis. Left  Kidney: Length: 9.6 cm. Normal morphology without mass or hydronephrosis. Abdominal aorta: Normal caliber Other findings: No free fluid.  RIGHT pleural effusion noted. IMPRESSION: Cholelithiasis without evidence of acute cholecystitis or biliary dilatation. Probable fatty infiltration of liver as above. RIGHT pleural effusion. Electronically Signed   By: Lavonia Dana M.D.   On: 09/24/2022 15:25   DG Chest Port 1 View  Result Date: 09/24/2022 CLINICAL DATA:  Shortness of breath. EXAM: PORTABLE CHEST 1 VIEW COMPARISON:  November 11, 2021. FINDINGS: Status post coronary bypass graft. Stable mild cardiomegaly with mild central pulmonary vascular congestion. Probable bibasilar pulmonary edema with small pleural effusions. Bony thorax is unremarkable. IMPRESSION: Stable cardiomegaly with mild central pulmonary vascular congestion and probable bibasilar pulmonary edema and small pleural effusions. Electronically Signed   By: Marijo Conception M.D.   On: 09/24/2022 10:52    Orson Eva, DO  [facility]  If 7PM-7AM, please contact night-coverage [email or web address] Password [facility] 09/26/2022, 3:51 PM   LOS: 2 days

## 2022-09-27 ENCOUNTER — Encounter (HOSPITAL_COMMUNITY): Payer: Self-pay | Admitting: Cardiology

## 2022-09-27 ENCOUNTER — Inpatient Hospital Stay (HOSPITAL_COMMUNITY)
Admission: EM | Disposition: A | Discharge status: Home Health | Payer: Self-pay | Source: Home / Self Care | Attending: Family Medicine

## 2022-09-27 ENCOUNTER — Ambulatory Visit (HOSPITAL_COMMUNITY): Admit: 2022-09-27 | Payer: Medicare HMO | Admitting: Cardiovascular Disease

## 2022-09-27 DIAGNOSIS — D539 Nutritional anemia, unspecified: Secondary | ICD-10-CM | POA: Diagnosis not present

## 2022-09-27 DIAGNOSIS — I25110 Atherosclerotic heart disease of native coronary artery with unstable angina pectoris: Secondary | ICD-10-CM

## 2022-09-27 DIAGNOSIS — I200 Unstable angina: Secondary | ICD-10-CM

## 2022-09-27 DIAGNOSIS — I350 Nonrheumatic aortic (valve) stenosis: Secondary | ICD-10-CM

## 2022-09-27 DIAGNOSIS — I5033 Acute on chronic diastolic (congestive) heart failure: Secondary | ICD-10-CM | POA: Diagnosis not present

## 2022-09-27 DIAGNOSIS — J9601 Acute respiratory failure with hypoxia: Secondary | ICD-10-CM | POA: Diagnosis not present

## 2022-09-27 DIAGNOSIS — D6869 Other thrombophilia: Secondary | ICD-10-CM | POA: Diagnosis not present

## 2022-09-27 DIAGNOSIS — F1010 Alcohol abuse, uncomplicated: Secondary | ICD-10-CM | POA: Diagnosis not present

## 2022-09-27 HISTORY — PX: RIGHT/LEFT HEART CATH AND CORONARY/GRAFT ANGIOGRAPHY: CATH118267

## 2022-09-27 LAB — COMPREHENSIVE METABOLIC PANEL
ALT: 34 U/L (ref 0–44)
AST: 41 U/L (ref 15–41)
Albumin: 3.2 g/dL — ABNORMAL LOW (ref 3.5–5.0)
Alkaline Phosphatase: 244 U/L — ABNORMAL HIGH (ref 38–126)
Anion gap: 7 (ref 5–15)
BUN: 14 mg/dL (ref 8–23)
CO2: 29 mmol/L (ref 22–32)
Calcium: 8.4 mg/dL — ABNORMAL LOW (ref 8.9–10.3)
Chloride: 100 mmol/L (ref 98–111)
Creatinine, Ser: 0.92 mg/dL (ref 0.61–1.24)
GFR, Estimated: >60 mL/min (ref >60)
Glucose, Bld: 99 mg/dL (ref 70–99)
Potassium: 3.9 mmol/L (ref 3.5–5.1)
Sodium: 136 mmol/L (ref 135–145)
Total Bilirubin: 3.3 mg/dL — ABNORMAL HIGH (ref 0.3–1.2)
Total Protein: 6.2 g/dL — ABNORMAL LOW (ref 6.5–8.1)

## 2022-09-27 LAB — CBC
HCT: 23.8 % — ABNORMAL LOW (ref 39.0–52.0)
HCT: 23.4 % — ABNORMAL LOW (ref 39.0–52.0)
Hemoglobin: 7.5 g/dL — ABNORMAL LOW (ref 13.0–17.0)
Hemoglobin: 7.6 g/dL — ABNORMAL LOW (ref 13.0–17.0)
MCH: 37.3 pg — ABNORMAL HIGH (ref 26.0–34.0)
MCH: 37.8 pg — ABNORMAL HIGH (ref 26.0–34.0)
MCHC: 31.5 g/dL (ref 30.0–36.0)
MCHC: 32.5 g/dL (ref 30.0–36.0)
MCV: 118.4 fL — ABNORMAL HIGH (ref 80.0–100.0)
MCV: 116.4 fL — ABNORMAL HIGH (ref 80.0–100.0)
Platelets: 173 10*3/uL (ref 150–400)
Platelets: 180 10*3/uL (ref 150–400)
RBC: 2.01 MIL/uL — ABNORMAL LOW (ref 4.22–5.81)
RBC: 2.01 MIL/uL — ABNORMAL LOW (ref 4.22–5.81)
RDW: 20.6 % — ABNORMAL HIGH (ref 11.5–15.5)
RDW: 20.7 % — ABNORMAL HIGH (ref 11.5–15.5)
WBC: 8.3 10*3/uL (ref 4.0–10.5)
WBC: 7.6 10*3/uL (ref 4.0–10.5)
nRBC: 0.4 % — ABNORMAL HIGH (ref 0.0–0.2)
nRBC: 0.3 % — ABNORMAL HIGH (ref 0.0–0.2)

## 2022-09-27 LAB — POCT I-STAT 7, (LYTES, BLD GAS, ICA,H+H)
Acid-Base Excess: 2 mmol/L (ref 0.0–2.0)
Bicarbonate: 28.1 mmol/L — ABNORMAL HIGH (ref 20.0–28.0)
Calcium, Ion: 1.19 mmol/L (ref 1.15–1.40)
HCT: 21 % — ABNORMAL LOW (ref 39.0–52.0)
Hemoglobin: 7.1 g/dL — ABNORMAL LOW (ref 13.0–17.0)
O2 Saturation: 93 %
Potassium: 4 mmol/L (ref 3.5–5.1)
Sodium: 138 mmol/L (ref 135–145)
TCO2: 30 mmol/L (ref 22–32)
pCO2 arterial: 49.6 mmHg — ABNORMAL HIGH (ref 32–48)
pH, Arterial: 7.361 (ref 7.35–7.45)
pO2, Arterial: 71 mmHg — ABNORMAL LOW (ref 83–108)

## 2022-09-27 LAB — POCT I-STAT EG7
Acid-Base Excess: 3 mmol/L — ABNORMAL HIGH (ref 0.0–2.0)
Acid-Base Excess: 3 mmol/L — ABNORMAL HIGH (ref 0.0–2.0)
Bicarbonate: 29.4 mmol/L — ABNORMAL HIGH (ref 20.0–28.0)
Bicarbonate: 29.7 mmol/L — ABNORMAL HIGH (ref 20.0–28.0)
Calcium, Ion: 1.25 mmol/L (ref 1.15–1.40)
Calcium, Ion: 1.26 mmol/L (ref 1.15–1.40)
HCT: 22 % — ABNORMAL LOW (ref 39.0–52.0)
HCT: 22 % — ABNORMAL LOW (ref 39.0–52.0)
Hemoglobin: 7.5 g/dL — ABNORMAL LOW (ref 13.0–17.0)
Hemoglobin: 7.5 g/dL — ABNORMAL LOW (ref 13.0–17.0)
O2 Saturation: 64 %
O2 Saturation: 65 %
Potassium: 4.2 mmol/L (ref 3.5–5.1)
Potassium: 4.2 mmol/L (ref 3.5–5.1)
Sodium: 137 mmol/L (ref 135–145)
Sodium: 138 mmol/L (ref 135–145)
TCO2: 31 mmol/L (ref 22–32)
TCO2: 31 mmol/L (ref 22–32)
pCO2, Ven: 54.2 mmHg (ref 44–60)
pCO2, Ven: 55.5 mmHg (ref 44–60)
pH, Ven: 7.336 (ref 7.25–7.43)
pH, Ven: 7.343 (ref 7.25–7.43)
pO2, Ven: 36 mmHg (ref 32–45)
pO2, Ven: 36 mmHg (ref 32–45)

## 2022-09-27 LAB — PROTIME-INR
INR: 1.6 — ABNORMAL HIGH (ref 0.8–1.2)
Prothrombin Time: 18.7 seconds — ABNORMAL HIGH (ref 11.4–15.2)

## 2022-09-27 LAB — LACTATE DEHYDROGENASE: LDH: 139 U/L (ref 98–192)

## 2022-09-27 LAB — CREATININE, SERUM
Creatinine, Ser: 0.93 mg/dL (ref 0.61–1.24)
GFR, Estimated: >60 mL/min (ref >60)

## 2022-09-27 LAB — MAGNESIUM: Magnesium: 2.2 mg/dL (ref 1.7–2.4)

## 2022-09-27 SURGERY — RIGHT/LEFT HEART CATH AND CORONARY/GRAFT ANGIOGRAPHY
Anesthesia: LOCAL

## 2022-09-27 MED ORDER — HEPARIN (PORCINE) IN NACL 1000-0.9 UT/500ML-% IV SOLN
INTRAVENOUS | Status: AC
  Filled 2022-09-27: qty 1000

## 2022-09-27 MED ORDER — FENTANYL CITRATE (PF) 100 MCG/2ML IJ SOLN
INTRAMUSCULAR | Status: DC | PRN
  Administered 2022-09-27: 50 ug via INTRAVENOUS
  Administered 2022-09-27: 25 ug via INTRAVENOUS

## 2022-09-27 MED ORDER — SODIUM CHLORIDE 0.9% FLUSH
3.0000 mL | Freq: Two times a day (BID) | INTRAVENOUS | Status: DC
  Administered 2022-09-28 – 2022-10-01 (×6): 3 mL via INTRAVENOUS

## 2022-09-27 MED ORDER — LIDOCAINE HCL (PF) 1 % IJ SOLN
INTRAMUSCULAR | Status: AC
  Filled 2022-09-27: qty 30

## 2022-09-27 MED ORDER — HEPARIN SODIUM (PORCINE) 5000 UNIT/ML IJ SOLN
5000.0000 [IU] | Freq: Three times a day (TID) | INTRAMUSCULAR | Status: DC
  Administered 2022-09-27 – 2022-09-28 (×2): 5000 [IU] via SUBCUTANEOUS
  Filled 2022-09-27: qty 1

## 2022-09-27 MED ORDER — SODIUM CHLORIDE 0.9 % IV SOLN: 250.0000 mL | INTRAVENOUS | Status: DC | PRN

## 2022-09-27 MED ORDER — HYDRALAZINE HCL 20 MG/ML IJ SOLN: 10.0000 mg | INTRAMUSCULAR | Status: AC | PRN

## 2022-09-27 MED ORDER — ASPIRIN 81 MG PO CHEW
81.0000 mg | CHEWABLE_TABLET | ORAL | Status: AC
  Administered 2022-09-27: 81 mg via ORAL
  Filled 2022-09-27: qty 1

## 2022-09-27 MED ORDER — SODIUM CHLORIDE 0.9 % IV SOLN: INTRAVENOUS | Status: DC

## 2022-09-27 MED ORDER — FOLIC ACID 1 MG PO TABS
1.0000 mg | ORAL_TABLET | Freq: Every day | ORAL | Status: DC
  Administered 2022-09-27 – 2022-10-01 (×5): 1 mg via ORAL
  Filled 2022-09-27 (×5): qty 1

## 2022-09-27 MED ORDER — FENTANYL CITRATE (PF) 100 MCG/2ML IJ SOLN
INTRAMUSCULAR | Status: AC
  Filled 2022-09-27: qty 2

## 2022-09-27 MED ORDER — MIDAZOLAM HCL 2 MG/2ML IJ SOLN
INTRAMUSCULAR | Status: DC | PRN
  Administered 2022-09-27: 1 mg via INTRAVENOUS

## 2022-09-27 MED ORDER — MIDAZOLAM HCL 2 MG/2ML IJ SOLN
INTRAMUSCULAR | Status: AC
  Filled 2022-09-27: qty 2

## 2022-09-27 MED ORDER — HEPARIN (PORCINE) IN NACL 1000-0.9 UT/500ML-% IV SOLN
INTRAVENOUS | Status: DC | PRN
  Administered 2022-09-27 (×2): 500 mL

## 2022-09-27 MED ORDER — PANTOPRAZOLE SODIUM 40 MG IV SOLR
40.0000 mg | Freq: Two times a day (BID) | INTRAVENOUS | Status: DC
  Administered 2022-09-27 – 2022-10-01 (×9): 40 mg via INTRAVENOUS
  Filled 2022-09-27 (×9): qty 10

## 2022-09-27 MED ORDER — ASPIRIN 81 MG PO CHEW: 81.0000 mg | CHEWABLE_TABLET | ORAL | Status: DC

## 2022-09-27 MED ORDER — IOHEXOL 350 MG/ML SOLN
INTRAVENOUS | Status: DC | PRN
  Administered 2022-09-27: 90 mL via INTRA_ARTERIAL

## 2022-09-27 MED ORDER — SODIUM CHLORIDE 0.9% FLUSH: 3.0000 mL | INTRAVENOUS | Status: DC | PRN

## 2022-09-27 MED ORDER — THIAMINE MONONITRATE 100 MG PO TABS
100.0000 mg | ORAL_TABLET | Freq: Every day | ORAL | Status: DC
  Administered 2022-09-27 – 2022-10-01 (×5): 100 mg via ORAL
  Filled 2022-09-27 (×5): qty 1

## 2022-09-27 MED ORDER — LIDOCAINE HCL (PF) 1 % IJ SOLN
INTRAMUSCULAR | Status: DC | PRN
  Administered 2022-09-27: 12 mL

## 2022-09-27 SURGICAL SUPPLY — 16 items
CATH INFINITI 5 FR IM (CATHETERS) IMPLANT
CATH INFINITI 5FR JL5 (CATHETERS) IMPLANT
CATH INFINITI 5FR MULTPACK ANG (CATHETERS) IMPLANT
CATH SWAN GANZ 7F STRAIGHT (CATHETERS) IMPLANT
CLOSURE MYNX CONTROL 5F (Vascular Products) IMPLANT
KIT HEART LEFT (KITS) ×1 IMPLANT
KIT MICROPUNCTURE NIT STIFF (SHEATH) IMPLANT
MAT PREVALON FULL STRYKER (MISCELLANEOUS) IMPLANT
PACK CARDIAC CATHETERIZATION (CUSTOM PROCEDURE TRAY) ×1 IMPLANT
SHEATH PINNACLE 5F 10CM (SHEATH) IMPLANT
SHEATH PINNACLE 7F 10CM (SHEATH) IMPLANT
SHEATH PROBE COVER 6X72 (BAG) IMPLANT
TRANSDUCER W/STOPCOCK (MISCELLANEOUS) ×1 IMPLANT
TUBING CIL FLEX 10 FLL-RA (TUBING) ×1 IMPLANT
WIRE EMERALD 3MM-J .035X150CM (WIRE) IMPLANT
WIRE EMERALD 3MM-J .035X260CM (WIRE) IMPLANT

## 2022-09-27 NOTE — Progress Notes (Signed)
Patient received in bed. No signs or symptoms of distress noted. Right groin site is soft with no bruising.

## 2022-09-27 NOTE — Interval H&P Note (Signed)
History and Physical Interval Note:  09/27/2022 3:10 PM  Leonard Cisneros  has presented today for surgery, with the diagnosis of aortic stenosis.  The various methods of treatment have been discussed with the patient and family. After consideration of risks, benefits and other options for treatment, the patient has consented to  Procedure(s): RIGHT/LEFT HEART CATH AND CORONARY/GRAFT ANGIOGRAPHY (N/A) as a surgical intervention.  The patient's history has been reviewed, patient examined, no change in status, stable for surgery.  I have reviewed the patient's chart and labs.  Questions were answered to the patient's satisfaction.   Cath Lab Visit (complete for each Cath Lab visit)  Clinical Evaluation Leading to the Procedure:   ACS: Yes.    Non-ACS:    Anginal Classification: CCS III  Anti-ischemic medical therapy: Maximal Therapy (2 or more classes of medications)  Non-Invasive Test Results: No non-invasive testing performed  Prior CABG: Previous CABG        [name] 09/27/2022 3:10 PM

## 2022-09-27 NOTE — H&P (View-Only) (Signed)
Progress Note  Patient Name: Leonard Cisneros Date of Encounter: 09/27/2022  Primary Cardiologist: Rozann Lesches, MD  Subjective   Up in chair today.  States that he slept better.  Leg edema improving.  No chest pain last night, did have some yesterday.  Inpatient Medications    Scheduled Meds:  atorvastatin  80 mg Oral QPM   diltiazem  240 mg Oral Daily   isosorbide mononitrate  30 mg Oral Daily   metoprolol succinate  100 mg Oral Daily   potassium chloride  30 mEq Oral BID   sodium chloride flush  3 mL Intravenous Q12H   thiamine  100 mg Oral Daily    sodium chloride 10 mL/hr at 09/27/22 0542   PRN Meds: acetaminophen **OR** acetaminophen, bisacodyl, fentaNYL (SUBLIMAZE) injection, ipratropium-albuterol, nitroGLYCERIN, ondansetron **OR** ondansetron (ZOFRAN) IV, oxyCODONE, traZODone   Vital Signs    Vitals:   09/26/22 1400 09/26/22 2120 09/27/22 0600 09/27/22 0838  BP: (!) 112/47 (!) 114/57 129/70 116/61  Pulse: 73 70 78 60  Resp: '18 20 20 18  '$ Temp: 97.8 F (36.6 C) (!) 97.3 F (36.3 C) (!) 97.4 F (36.3 C) (!) 97.3 F (36.3 C)  TempSrc: Oral  Oral Oral  SpO2: 95% (!) 85% 98% 98%  Weight:   100.2 kg   Height:        Intake/Output Summary (Last 24 hours) at 09/27/2022 0856 Last data filed at 09/27/2022 0626 Gross per 24 hour  Intake 727.33 ml  Output --  Net 727.33 ml   Filed Weights   09/25/22 0700 09/26/22 0629 09/27/22 0600  Weight: 101.2 kg 100.8 kg 100.2 kg    Telemetry    Rate controlled atrial fibrillation.  Personally reviewed.  ECG    An ECG dated 09/24/2022 was personally reviewed today and demonstrated:  Atrial fibrillation with repolarization abnormalities, low voltage in the limb leads, lead motion artifact.  Physical Exam   GEN: No acute distress.   Neck: No JVD. Cardiac: Irregularly irregular, 2/6 systolic murmur, no gallop. Respiratory: Nonlabored.  Decreased breath sounds at the bases. GI: Soft, nontender, bowel sounds  present. MS: Improving leg edema. Neuro:  Nonfocal. Psych: Alert and oriented x 3. Normal affect.  Labs    Chemistry Recent Labs  Lab 09/25/22 0442 09/26/22 0500 09/27/22 0450  NA 135 135 136  K 3.6 4.0 3.9  CL 100 99 100  CO2 '28 29 29  '$ GLUCOSE 99 121* 99  BUN '13 14 14  '$ CREATININE 0.89 0.94 0.92  CALCIUM 8.2* 8.5* 8.4*  PROT 6.0* 6.3* 6.2*  ALBUMIN 3.1* 3.3* 3.2*  AST 35 38 41  ALT 29 33 34  ALKPHOS 233* 250* 244*  BILITOT 3.8* 3.7* 3.3*  GFRNONAA >60 >60 >60  ANIONGAP '7 7 7     '$ Hematology Recent Labs  Lab 09/25/22 0442 09/26/22 0500 09/27/22 0450  WBC 8.0 8.6 8.3  RBC 2.04* 2.11* 2.01*  HGB 7.6* 8.0* 7.5*  HCT 23.9* 24.8* 23.8*  MCV 117.2* 117.5* 118.4*  MCH 37.3* 37.9* 37.3*  MCHC 31.8 32.3 31.5  RDW 20.6* 20.6* 20.6*  PLT 176 182 173    Cardiac Enzymes Recent Labs  Lab 09/24/22 1020 09/24/22 1242  TROPONINIHS 18* 18*    BNP Recent Labs  Lab 09/24/22 1020 09/25/22 0442  BNP 288.0* 247.0*     Radiology    ECHOCARDIOGRAM COMPLETE  Result Date: 09/25/2022    ECHOCARDIOGRAM REPORT   Patient Name:   Leonard Cisneros Date of Exam: 09/25/2022  Medical Rec #:  [id number]        Height:       71.0 in Accession #:    [id number]       Weight:       223.1 lb Date of Birth:  Jan 06, 1941       BSA:          2.209 m Patient Age:    82 years         BP:           111/54 mmHg Patient Gender: M                HR:           74 bpm. Exam Location:  [facility] Procedure: 2D Echo, Cardiac Doppler and Color Doppler Indications:    CHF  History:        Patient has prior history of Echocardiogram examinations, most                 recent 11/12/2021. CHF and Cardiomyopathy, CAD, Prior CABG, PAD                 and Pulmonary HTN, Arrythmias:Atrial Fibrillation,                 Signs/Symptoms:Chest Pain; Risk Factors:Hypertension and                 Dyslipidemia. ETOH abuse.  Sonographer:    Wenda Low Referring Phys: [place]  1. Left  ventricular ejection fraction, by estimation, is 55 to 60%. The left ventricle has normal function. The left ventricle has no regional wall motion abnormalities. There is moderate concentric left ventricular hypertrophy. Left ventricular diastolic parameters are indeterminate.  2. Right ventricular systolic function is normal. The right ventricular size is mildly enlarged. Tricuspid regurgitation signal is inadequate for assessing PA pressure.  3. Left atrial size was severely dilated.  4. Right atrial size was mildly dilated.  5. Left pleural effusion noted.  6. The mitral valve is degenerative. Mild mitral valve regurgitation. Severe mitral annular calcification.  7. The aortic valve is tricuspid. There is moderate calcification of the aortic valve. Aortic valve regurgitation is not visualized. Moderate to severe aortic valve stenosis. Aortic valve mean gradient measures 24.0 mmHg. Dimentionless index 0.34.  8. Aortic dilatation noted. There is mild dilatation of the ascending aorta, measuring 39 mm.  9. The inferior vena cava is dilated in size with <50% respiratory variability, suggesting right atrial pressure of 15 mmHg. Comparison(s): Prior images reviewed side by side. LVEF 55-60% range. Degenerative, calcific aortic stenosis of moderate to severe range with similar gradients as prior study. FINDINGS  Left Ventricle: Left ventricular ejection fraction, by estimation, is 55 to 60%. The left ventricle has normal function. The left ventricle has no regional wall motion abnormalities. The left ventricular internal cavity size was normal in size. There is  moderate concentric left ventricular hypertrophy. Left ventricular diastolic function could not be evaluated due to atrial fibrillation. Left ventricular diastolic parameters are indeterminate. Right Ventricle: The right ventricular size is mildly enlarged. No increase in right ventricular wall thickness. Right ventricular systolic function is normal. Tricuspid  regurgitation signal is inadequate for assessing PA pressure. Left Atrium: Left atrial size was severely dilated. Right Atrium: Right atrial size was mildly dilated. Pericardium: Left pleural effusion noted. There is no evidence of pericardial effusion. Mitral Valve: The mitral valve is degenerative in appearance. Severe mitral annular calcification. Mild mitral  valve regurgitation. MV peak gradient, 15.8 mmHg. The mean mitral valve gradient is 4.0 mmHg. Tricuspid Valve: The tricuspid valve is grossly normal. Tricuspid valve regurgitation is mild. Aortic Valve: The aortic valve is tricuspid. There is moderate calcification of the aortic valve. There is mild to moderate aortic valve annular calcification. Aortic valve regurgitation is not visualized. Moderate to severe aortic stenosis is present. Aortic valve mean gradient measures 24.0 mmHg. Aortic valve peak gradient measures 42.1 mmHg. Aortic valve area, by VTI measures 1.06 cm. Pulmonic Valve: The pulmonic valve was grossly normal. Pulmonic valve regurgitation is trivial. Aorta: The aortic root is normal in size and structure and aortic dilatation noted. There is mild dilatation of the ascending aorta, measuring 39 mm. Venous: The inferior vena cava is dilated in size with less than 50% respiratory variability, suggesting right atrial pressure of 15 mmHg. IAS/Shunts: No atrial level shunt detected by color flow Doppler. Additional Comments: There is pleural effusion in the left lateral region.  LEFT VENTRICLE PLAX 2D LVIDd:         5.50 cm   Diastology LVIDs:         3.90 cm   LV e' lateral:   12.40 cm/s LV PW:         1.30 cm   LV E/e' lateral: 14.1 LV IVS:        1.40 cm LVOT diam:     2.00 cm LV SV:         79 LV SV Index:   36 LVOT Area:     3.14 cm  RIGHT VENTRICLE RV Basal diam:  4.35 cm RV Mid diam:    3.60 cm RV S prime:     11.70 cm/s TAPSE (M-mode): 2.2 cm LEFT ATRIUM              Index        RIGHT ATRIUM           Index LA diam:        5.50 cm  2.49  cm/m   RA Area:     26.20 cm LA Vol (A2C):   154.0 ml 69.72 ml/m  RA Volume:   85.70 ml  38.80 ml/m LA Vol (A4C):   188.0 ml 85.11 ml/m LA Biplane Vol: 176.0 ml 79.68 ml/m  AORTIC VALVE                     PULMONIC VALVE AV Area (Vmax):    1.13 cm      PV Vmax:       1.22 m/s AV Area (Vmean):   1.05 cm      PV Peak grad:  6.0 mmHg AV Area (VTI):     1.06 cm AV Vmax:           324.50 cm/s AV Vmean:          222.500 cm/s AV VTI:            0.751 m AV Peak Grad:      42.1 mmHg AV Mean Grad:      24.0 mmHg LVOT Vmax:         116.33 cm/s LVOT Vmean:        74.533 cm/s LVOT VTI:          0.253 m LVOT/AV VTI ratio: 0.34  AORTA Ao Root diam: 3.60 cm Ao Asc diam:  3.90 cm MITRAL VALVE MV Area (PHT): 4.83 cm     SHUNTS MV Area VTI:  2.08 cm     Systemic VTI:  0.25 m MV Peak grad:  15.8 mmHg    Systemic Diam: 2.00 cm MV Mean grad:  4.0 mmHg MV Vmax:       1.99 m/s MV Vmean:      82.4 cm/s MV Decel Time: 157 msec MV E velocity: 175.00 cm/s Rozann Lesches MD Electronically signed by Rozann Lesches MD Signature Date/Time: 09/25/2022/12:35:26 PM    Final     Assessment & Plan    1.  Acute on chronic HFpEF, LVEF 55 to 60% with moderate LVH.  Intake and output incomplete but weight down 2 pounds in the last 24 hours.  Currently on Lasix 40 mg IV twice daily with potassium supplement and stable renal function.  2.  Moderate to severe calcific, degenerative aortic stenosis, mean AV gradient 24 mmHg and dimensionless index 0.34.  Given heart failure symptoms and intermittent angina, question whether severity is underestimated (could be normal flow/low gradient).  3.  Recurring episodes of chest discomfort concerning for angina, although no clear evidence of ACS by cardiac enzymes.  He has known CAD status post CABG in 2003 and subsequent graft intervention in 2017.  Not on aspirin given use of Eliquis.  Also on Imdur and Lipitor.  4.  Persistent atrial fibrillation with CHA2DS2-VASc score of 5.  Has been on  Eliquis for stroke prophylaxis, also Cardizem CD and Toprol-XL for heart rate control.  5.  Macrocytic anemia, hemoglobin 7.5 today and MCV 118.  B12 normal.  No overt bleeding noted.  Stools negative so far.  Eliquis is on hold with plan for right and left heart catheterization to reassess coronary and graft anatomy and also further evaluate aortic stenosis for potential TAVR assessment.  He has macrocytic anemia as discussed above, no obvious bleeding noted and stools negative so far.  Hemoglobin is 7.5 today.  May need to get Hematology consultation in terms of etiology as this may have some bearing on his cardiac workup and candidacy for further procedures.  Not clear that he cannot undergo a diagnostic cardiac catheterization at this time however.  Signed, Rozann Lesches, MD  09/27/2022, 8:56 AM

## 2022-09-27 NOTE — Progress Notes (Addendum)
PROGRESS NOTE  Leonard Cisneros MGQ:[id number] DOB: 1941-06-04 DOA: 09/24/2022 PCP: Alvira Monday, FNP  Brief History:  82 year old gentleman with history of a flutter, ischemic cardiomyopathy status post CABG x 2, coronary artery disease, HFpEF, hypertension, hyperlipidemia, moderate aortic stenosis, permanent atrial fibrillation on apixaban reports that for the past 12 days he has had shortness of breath and increasing edema in the legs.  He has been taking Demadex and increasing the doses at home but it has not been able to control his symptoms although he has had an increase in urination since increasing the doses.  He reports having a cough, diminished appetite, weight gain and intermittent chest pain episodes.  He was evaluated in the emergency department and noted to have a hemoglobin down to 8.1, potassium down to 2.9, bilirubin elevated at 3.6, sodium down to 131, BNP 288, troponin 18, 18. Chest xray with findings of pulmonary edema.  He was started on IV lasix and admission requested for further diuresis and cardiology consultation.   Patient slowly improved clinically with IV lasix, but continued to have intermittent chest discomfort.  As a result, cardiology requested transfer to [facility] for right and left heart cath.   Assessment/Plan:  Acute on chronic HFpEF - pt has failed outpatient management with increasing torsemide doses at home - continue IV lasix --on hold temporarily for heart cath 09/27/22 - monitor daily weight, intake/output, - 09/25/22 ReDS--32 - 09/25/22 Echo--EF 55-60%, no WMA, normal RVF; mod to severe AS - daily BMP, Mg - 09/26/22--discussed with cardiology--Dr. McDowell>>plan for R & L heart cath 09/27/22 - Hx CABG 2003, 01/2016 s/p BMS to SVG - ramus intermediate, patent LIMA-mLAD, patent RIMA-RCA    Moderate to severe aortic stenosis -mean AV gradient 24 mmHg and dimensionless index 0.34.  - 09/26/22--discussed with cardiology--Dr. McDowell>>plan for  R & L heart cath 09/27/22   Persistent Atrial Fibrillation  - he is on apixaban--holding for heart cath - metoprolol XL 100 mg and diltiazem XR 240 mg - rate controlled  Acute respiratory failure with hypoxia -presented with tachypnea and saturation 82% RA -stable on 4L -remote tobacco -wean oxygen for sat >92% -may need to go home with oxygen   Hyperlipidemia - resume home atorvastatin 80 mg QHS    Hyperbilirubinemia  - mostly indirect - check LDH -- 139 - haptoglobin pending - abd US--no biliary ductal dilatation - suspect Gilbert's - Hgb remains stable this hospitalization - hx of Etoh abuse   Hyponatremia -due to decompensated CHF -improving with diuresis  Etoh dependence -drinks 2 beers daily -previous liquor -no signs of withdrawal during hospitalization -start thiamine   Hypokalemia -repleting -mag 2.2   Macrocytic anemia - follow up stool guaiac testing --ordered - B12 553 - folate 8.6 - iron saturation 56%, ferritin 310 - Hgb stable since admission--no signs of active blood loss - indices consistent with AOCD in setting of chronic Etoh use - start pantoprazole empirically   Hypertension -metoprolol succinate and diltiazem CD   Acquired thrombophilia - we are in process to hemoccult stools - continue apixaban for now   Obesity -BMI 30.99 -lifestyle modification      Family Communication: no   Family at bedside  Consultants:  cardiology  Code Status:  FULL  DVT Prophylaxis:  apixaban   Procedures: As Listed in Progress Note Above  Antibiotics: None    Subjective: Denies any cp or sob overnight.  Denies f/c, n/v/d, abd pain headache.  Objective: Vitals:   09/26/22 0629 09/26/22 1400 09/26/22 2120 09/27/22 0600  BP:  (!) 112/47 (!) 114/57 129/70  Pulse:  73 70 78  Resp:  '18 20 20  '$ Temp:  97.8 F (36.6 C) (!) 97.3 F (36.3 C) (!) 97.4 F (36.3 C)  TempSrc:  Oral  Oral  SpO2:  95% (!) 85% 98%  Weight: 100.8 kg   100.2 kg   Height:        Intake/Output Summary (Last 24 hours) at 09/27/2022 0710 Last data filed at 09/27/2022 3086 Gross per 24 hour  Intake 727.33 ml  Output --  Net 727.33 ml   Weight change: -0.646 kg Exam:  General:  Pt is alert, follows commands appropriately, not in acute distress HEENT: No icterus, No thrush, No neck mass, [place]/AT Cardiovascular: RRR, S1/S2, no rubs, no gallops Respiratory: bibasilar crackles. N0 wheeze Abdomen: Soft/+BS, non tender, non distended, no guarding Extremities: 1 + LE edema, No lymphangitis, No petechiae, No rashes, no synovitis   Data Reviewed: I have personally reviewed following labs and imaging studies Basic Metabolic Panel: Recent Labs  Lab 09/24/22 1020 09/24/22 1431 09/25/22 0442 09/26/22 0500 09/27/22 0450  NA 131*  --  135 135 136  K 2.9*  --  3.6 4.0 3.9  CL 94*  --  100 99 100  CO2 26  --  '28 29 29  '$ GLUCOSE 120*  --  99 121* 99  BUN 15  --  '13 14 14  '$ CREATININE 0.85  --  0.89 0.94 0.92  CALCIUM 8.4*  --  8.2* 8.5* 8.4*  MG  --  1.9 2.0  --  2.2   Liver Function Tests: Recent Labs  Lab 09/24/22 1020 09/25/22 0442 09/26/22 0500 09/27/22 0450  AST 33 35 38 41  ALT 29 29 33 34  ALKPHOS 266* 233* 250* 244*  BILITOT 3.6* 3.8* 3.7* 3.3*  PROT 6.4* 6.0* 6.3* 6.2*  ALBUMIN 3.4* 3.1* 3.3* 3.2*   No results for input(s): "LIPASE", "AMYLASE" in the last 168 hours. No results for input(s): "AMMONIA" in the last 168 hours. Coagulation Profile: Recent Labs  Lab 09/27/22 0450  INR 1.6*   CBC: Recent Labs  Lab 09/24/22 1020 09/25/22 0442 09/26/22 0500 09/27/22 0450  WBC 6.9 8.0 8.6 8.3  HGB 8.1* 7.6* 8.0* 7.5*  HCT 24.7* 23.9* 24.8* 23.8*  MCV 114.9* 117.2* 117.5* 118.4*  PLT 206 176 182 173   Cardiac Enzymes: No results for input(s): "CKTOTAL", "CKMB", "CKMBINDEX", "TROPONINI" in the last 168 hours. BNP: Invalid input(s): "POCBNP" CBG: No results for input(s): "GLUCAP" in the last 168 hours. HbA1C: No results for  input(s): "HGBA1C" in the last 72 hours. Urine analysis:    Component Value Date/Time   COLORURINE YELLOW 04/09/2019 1105   APPEARANCEUR CLEAR 04/09/2019 1105   LABSPEC 1.011 04/09/2019 1105   PHURINE 5.0 04/09/2019 1105   GLUCOSEU NEGATIVE 04/09/2019 1105   HGBUR NEGATIVE 04/09/2019 1105   [place] 04/09/2019 1105   KETONESUR NEGATIVE 04/09/2019 1105   PROTEINUR NEGATIVE 04/09/2019 1105   UROBILINOGEN 0.2 11/28/2010 0915   NITRITE NEGATIVE 04/09/2019 1105   LEUKOCYTESUR NEGATIVE 04/09/2019 1105   Sepsis Labs: '@LABRCNTIP'$ (procalcitonin:4,lacticidven:4) )No results found for this or any previous visit (from the past 240 hour(s)).   Scheduled Meds:  atorvastatin  80 mg Oral QPM   diltiazem  240 mg Oral Daily   isosorbide mononitrate  30 mg Oral Daily   metoprolol succinate  100 mg Oral Daily   potassium chloride  30 mEq Oral BID   sodium chloride flush  3 mL Intravenous Q12H   Continuous Infusions:  sodium chloride 10 mL/hr at 09/27/22 0542    Procedures/Studies: ECHOCARDIOGRAM COMPLETE  Result Date: 09/25/2022    ECHOCARDIOGRAM REPORT   Patient Name:   Leonard Cisneros Date of Exam: 09/25/2022 Medical Rec #:  [id number]        Height:       71.0 in Accession #:    [id number]       Weight:       223.1 lb Date of Birth:  06/30/1941       BSA:          2.209 m Patient Age:    35 years         BP:           111/54 mmHg Patient Gender: M                HR:           74 bpm. Exam Location:  [facility] Procedure: 2D Echo, Cardiac Doppler and Color Doppler Indications:    CHF  History:        Patient has prior history of Echocardiogram examinations, most                 recent 11/12/2021. CHF and Cardiomyopathy, CAD, Prior CABG, PAD                 and Pulmonary HTN, Arrythmias:Atrial Fibrillation,                 Signs/Symptoms:Chest Pain; Risk Factors:Hypertension and                 Dyslipidemia. ETOH abuse.  Sonographer:    Wenda Low Referring Phys: [place]  1. Left ventricular ejection fraction, by estimation, is 55 to 60%. The left ventricle has normal function. The left ventricle has no regional wall motion abnormalities. There is moderate concentric left ventricular hypertrophy. Left ventricular diastolic parameters are indeterminate.  2. Right ventricular systolic function is normal. The right ventricular size is mildly enlarged. Tricuspid regurgitation signal is inadequate for assessing PA pressure.  3. Left atrial size was severely dilated.  4. Right atrial size was mildly dilated.  5. Left pleural effusion noted.  6. The mitral valve is degenerative. Mild mitral valve regurgitation. Severe mitral annular calcification.  7. The aortic valve is tricuspid. There is moderate calcification of the aortic valve. Aortic valve regurgitation is not visualized. Moderate to severe aortic valve stenosis. Aortic valve mean gradient measures 24.0 mmHg. Dimentionless index 0.34.  8. Aortic dilatation noted. There is mild dilatation of the ascending aorta, measuring 39 mm.  9. The inferior vena cava is dilated in size with <50% respiratory variability, suggesting right atrial pressure of 15 mmHg. Comparison(s): Prior images reviewed side by side. LVEF 55-60% range. Degenerative, calcific aortic stenosis of moderate to severe range with similar gradients as prior study. FINDINGS  Left Ventricle: Left ventricular ejection fraction, by estimation, is 55 to 60%. The left ventricle has normal function. The left ventricle has no regional wall motion abnormalities. The left ventricular internal cavity size was normal in size. There is  moderate concentric left ventricular hypertrophy. Left ventricular diastolic function could not be evaluated due to atrial fibrillation. Left ventricular diastolic parameters are indeterminate. Right Ventricle: The right ventricular size is mildly enlarged. No increase in right ventricular wall thickness. Right ventricular systolic  function  is normal. Tricuspid regurgitation signal is inadequate for assessing PA pressure. Left Atrium: Left atrial size was severely dilated. Right Atrium: Right atrial size was mildly dilated. Pericardium: Left pleural effusion noted. There is no evidence of pericardial effusion. Mitral Valve: The mitral valve is degenerative in appearance. Severe mitral annular calcification. Mild mitral valve regurgitation. MV peak gradient, 15.8 mmHg. The mean mitral valve gradient is 4.0 mmHg. Tricuspid Valve: The tricuspid valve is grossly normal. Tricuspid valve regurgitation is mild. Aortic Valve: The aortic valve is tricuspid. There is moderate calcification of the aortic valve. There is mild to moderate aortic valve annular calcification. Aortic valve regurgitation is not visualized. Moderate to severe aortic stenosis is present. Aortic valve mean gradient measures 24.0 mmHg. Aortic valve peak gradient measures 42.1 mmHg. Aortic valve area, by VTI measures 1.06 cm. Pulmonic Valve: The pulmonic valve was grossly normal. Pulmonic valve regurgitation is trivial. Aorta: The aortic root is normal in size and structure and aortic dilatation noted. There is mild dilatation of the ascending aorta, measuring 39 mm. Venous: The inferior vena cava is dilated in size with less than 50% respiratory variability, suggesting right atrial pressure of 15 mmHg. IAS/Shunts: No atrial level shunt detected by color flow Doppler. Additional Comments: There is pleural effusion in the left lateral region.  LEFT VENTRICLE PLAX 2D LVIDd:         5.50 cm   Diastology LVIDs:         3.90 cm   LV e' lateral:   12.40 cm/s LV PW:         1.30 cm   LV E/e' lateral: 14.1 LV IVS:        1.40 cm LVOT diam:     2.00 cm LV SV:         79 LV SV Index:   36 LVOT Area:     3.14 cm  RIGHT VENTRICLE RV Basal diam:  4.35 cm RV Mid diam:    3.60 cm RV S prime:     11.70 cm/s TAPSE (M-mode): 2.2 cm LEFT ATRIUM              Index        RIGHT ATRIUM           Index  LA diam:        5.50 cm  2.49 cm/m   RA Area:     26.20 cm LA Vol (A2C):   154.0 ml 69.72 ml/m  RA Volume:   85.70 ml  38.80 ml/m LA Vol (A4C):   188.0 ml 85.11 ml/m LA Biplane Vol: 176.0 ml 79.68 ml/m  AORTIC VALVE                     PULMONIC VALVE AV Area (Vmax):    1.13 cm      PV Vmax:       1.22 m/s AV Area (Vmean):   1.05 cm      PV Peak grad:  6.0 mmHg AV Area (VTI):     1.06 cm AV Vmax:           324.50 cm/s AV Vmean:          222.500 cm/s AV VTI:            0.751 m AV Peak Grad:      42.1 mmHg AV Mean Grad:      24.0 mmHg LVOT Vmax:         116.33 cm/s LVOT Vmean:  74.533 cm/s LVOT VTI:          0.253 m LVOT/AV VTI ratio: 0.34  AORTA Ao Root diam: 3.60 cm Ao Asc diam:  3.90 cm MITRAL VALVE MV Area (PHT): 4.83 cm     SHUNTS MV Area VTI:   2.08 cm     Systemic VTI:  0.25 m MV Peak grad:  15.8 mmHg    Systemic Diam: 2.00 cm MV Mean grad:  4.0 mmHg MV Vmax:       1.99 m/s MV Vmean:      82.4 cm/s MV Decel Time: 157 msec MV E velocity: 175.00 cm/s Rozann Lesches MD Electronically signed by Rozann Lesches MD Signature Date/Time: 09/25/2022/12:35:26 PM    Final    DG CHEST PORT 1 VIEW  Result Date: 09/25/2022 CLINICAL DATA:  Wheezing EXAM: PORTABLE CHEST 1 VIEW COMPARISON:  09/24/2022 FINDINGS: Lung volumes are small and pulmonary insufflation has slightly diminished since prior examination. Small bilateral pleural effusions again noted. Perihilar and lower lung zone interstitial pulmonary infiltrate persists in keeping with mild cardiogenic failure. Coronary artery bypass grafting has been performed. Cardiac size is mildly enlarged, unchanged. No pneumothorax. No acute bone abnormality. IMPRESSION: 1. Stable mild cardiogenic failure with small bilateral pleural effusions. 2. Pulmonary hypoinflation. Electronically Signed   By: Fidela Salisbury M.D.   On: 09/25/2022 03:26   US Abdomen Complete  Result Date: 09/24/2022 CLINICAL DATA:  Elevated LFTs EXAM: ABDOMEN ULTRASOUND COMPLETE  COMPARISON:  07/10/2005 FINDINGS: Gallbladder: Normally distended with calculi up to 21 mm diameter. No gallbladder wall thickening, pericholecystic fluid, or sonographic Murphy sign. Common bile duct: Diameter: 3 mm, normal Liver: Echogenic parenchyma, likely fatty infiltration though this can be seen with cirrhosis and certain infiltrative disorders. No focal hepatic mass or nodularity. Portal vein is patent on color Doppler imaging with normal direction of blood flow towards the liver. IVC: Normal appearance Pancreas: Normal appearance Spleen: Normal appearance, 10.3 cm length Right Kidney: Length: 10.1 cm. Normal morphology without mass or hydronephrosis. Left Kidney: Length: 9.6 cm. Normal morphology without mass or hydronephrosis. Abdominal aorta: Normal caliber Other findings: No free fluid.  RIGHT pleural effusion noted. IMPRESSION: Cholelithiasis without evidence of acute cholecystitis or biliary dilatation. Probable fatty infiltration of liver as above. RIGHT pleural effusion. Electronically Signed   By: Lavonia Dana M.D.   On: 09/24/2022 15:25   DG Chest Port 1 View  Result Date: 09/24/2022 CLINICAL DATA:  Shortness of breath. EXAM: PORTABLE CHEST 1 VIEW COMPARISON:  November 11, 2021. FINDINGS: Status post coronary bypass graft. Stable mild cardiomegaly with mild central pulmonary vascular congestion. Probable bibasilar pulmonary edema with small pleural effusions. Bony thorax is unremarkable. IMPRESSION: Stable cardiomegaly with mild central pulmonary vascular congestion and probable bibasilar pulmonary edema and small pleural effusions. Electronically Signed   By: Marijo Conception M.D.   On: 09/24/2022 10:52    Orson Eva, DO  [facility]  If 7PM-7AM, please contact night-coverage [email or web address] Password [email or web address] 09/27/2022, 7:10 AM   LOS: 3 days

## 2022-09-27 NOTE — Progress Notes (Signed)
Progress Note  Patient Name: Leonard Cisneros Date of Encounter: 09/27/2022  Primary Cardiologist: Rozann Lesches, MD  Subjective   Up in chair today.  States that he slept better.  Leg edema improving.  No chest pain last night, did have some yesterday.  Inpatient Medications    Scheduled Meds:  atorvastatin  80 mg Oral QPM   diltiazem  240 mg Oral Daily   isosorbide mononitrate  30 mg Oral Daily   metoprolol succinate  100 mg Oral Daily   potassium chloride  30 mEq Oral BID   sodium chloride flush  3 mL Intravenous Q12H   thiamine  100 mg Oral Daily    sodium chloride 10 mL/hr at 09/27/22 0542   PRN Meds: acetaminophen **OR** acetaminophen, bisacodyl, fentaNYL (SUBLIMAZE) injection, ipratropium-albuterol, nitroGLYCERIN, ondansetron **OR** ondansetron (ZOFRAN) IV, oxyCODONE, traZODone   Vital Signs    Vitals:   09/26/22 1400 09/26/22 2120 09/27/22 0600 09/27/22 0838  BP: (!) 112/47 (!) 114/57 129/70 116/61  Pulse: 73 70 78 60  Resp: '18 20 20 18  '$ Temp: 97.8 F (36.6 C) (!) 97.3 F (36.3 C) (!) 97.4 F (36.3 C) (!) 97.3 F (36.3 C)  TempSrc: Oral  Oral Oral  SpO2: 95% (!) 85% 98% 98%  Weight:   100.2 kg   Height:        Intake/Output Summary (Last 24 hours) at 09/27/2022 0856 Last data filed at 09/27/2022 0626 Gross per 24 hour  Intake 727.33 ml  Output --  Net 727.33 ml   Filed Weights   09/25/22 0700 09/26/22 0629 09/27/22 0600  Weight: 101.2 kg 100.8 kg 100.2 kg    Telemetry    Rate controlled atrial fibrillation.  Personally reviewed.  ECG    An ECG dated 09/24/2022 was personally reviewed today and demonstrated:  Atrial fibrillation with repolarization abnormalities, low voltage in the limb leads, lead motion artifact.  Physical Exam   GEN: No acute distress.   Neck: No JVD. Cardiac: Irregularly irregular, 2/6 systolic murmur, no gallop. Respiratory: Nonlabored.  Decreased breath sounds at the bases. GI: Soft, nontender, bowel sounds  present. MS: Improving leg edema. Neuro:  Nonfocal. Psych: Alert and oriented x 3. Normal affect.  Labs    Chemistry Recent Labs  Lab 09/25/22 0442 09/26/22 0500 09/27/22 0450  NA 135 135 136  K 3.6 4.0 3.9  CL 100 99 100  CO2 '28 29 29  '$ GLUCOSE 99 121* 99  BUN '13 14 14  '$ CREATININE 0.89 0.94 0.92  CALCIUM 8.2* 8.5* 8.4*  PROT 6.0* 6.3* 6.2*  ALBUMIN 3.1* 3.3* 3.2*  AST 35 38 41  ALT 29 33 34  ALKPHOS 233* 250* 244*  BILITOT 3.8* 3.7* 3.3*  GFRNONAA >60 >60 >60  ANIONGAP '7 7 7     '$ Hematology Recent Labs  Lab 09/25/22 0442 09/26/22 0500 09/27/22 0450  WBC 8.0 8.6 8.3  RBC 2.04* 2.11* 2.01*  HGB 7.6* 8.0* 7.5*  HCT 23.9* 24.8* 23.8*  MCV 117.2* 117.5* 118.4*  MCH 37.3* 37.9* 37.3*  MCHC 31.8 32.3 31.5  RDW 20.6* 20.6* 20.6*  PLT 176 182 173    Cardiac Enzymes Recent Labs  Lab 09/24/22 1020 09/24/22 1242  TROPONINIHS 18* 18*    BNP Recent Labs  Lab 09/24/22 1020 09/25/22 0442  BNP 288.0* 247.0*     Radiology    ECHOCARDIOGRAM COMPLETE  Result Date: 09/25/2022    ECHOCARDIOGRAM REPORT   Patient Name:   Leonard Cisneros Date of Exam: 09/25/2022  Medical Rec #:  [id number]        Height:       71.0 in Accession #:    [id number]       Weight:       223.1 lb Date of Birth:  Jun 05, 1941       BSA:          2.209 m Patient Age:    39 years         BP:           111/54 mmHg Patient Gender: M                HR:           74 bpm. Exam Location:  [facility] Procedure: 2D Echo, Cardiac Doppler and Color Doppler Indications:    CHF  History:        Patient has prior history of Echocardiogram examinations, most                 recent 11/12/2021. CHF and Cardiomyopathy, CAD, Prior CABG, PAD                 and Pulmonary HTN, Arrythmias:Atrial Fibrillation,                 Signs/Symptoms:Chest Pain; Risk Factors:Hypertension and                 Dyslipidemia. ETOH abuse.  Sonographer:    Wenda Low Referring Phys: [place]  1. Left  ventricular ejection fraction, by estimation, is 55 to 60%. The left ventricle has normal function. The left ventricle has no regional wall motion abnormalities. There is moderate concentric left ventricular hypertrophy. Left ventricular diastolic parameters are indeterminate.  2. Right ventricular systolic function is normal. The right ventricular size is mildly enlarged. Tricuspid regurgitation signal is inadequate for assessing PA pressure.  3. Left atrial size was severely dilated.  4. Right atrial size was mildly dilated.  5. Left pleural effusion noted.  6. The mitral valve is degenerative. Mild mitral valve regurgitation. Severe mitral annular calcification.  7. The aortic valve is tricuspid. There is moderate calcification of the aortic valve. Aortic valve regurgitation is not visualized. Moderate to severe aortic valve stenosis. Aortic valve mean gradient measures 24.0 mmHg. Dimentionless index 0.34.  8. Aortic dilatation noted. There is mild dilatation of the ascending aorta, measuring 39 mm.  9. The inferior vena cava is dilated in size with <50% respiratory variability, suggesting right atrial pressure of 15 mmHg. Comparison(s): Prior images reviewed side by side. LVEF 55-60% range. Degenerative, calcific aortic stenosis of moderate to severe range with similar gradients as prior study. FINDINGS  Left Ventricle: Left ventricular ejection fraction, by estimation, is 55 to 60%. The left ventricle has normal function. The left ventricle has no regional wall motion abnormalities. The left ventricular internal cavity size was normal in size. There is  moderate concentric left ventricular hypertrophy. Left ventricular diastolic function could not be evaluated due to atrial fibrillation. Left ventricular diastolic parameters are indeterminate. Right Ventricle: The right ventricular size is mildly enlarged. No increase in right ventricular wall thickness. Right ventricular systolic function is normal. Tricuspid  regurgitation signal is inadequate for assessing PA pressure. Left Atrium: Left atrial size was severely dilated. Right Atrium: Right atrial size was mildly dilated. Pericardium: Left pleural effusion noted. There is no evidence of pericardial effusion. Mitral Valve: The mitral valve is degenerative in appearance. Severe mitral annular calcification. Mild mitral  valve regurgitation. MV peak gradient, 15.8 mmHg. The mean mitral valve gradient is 4.0 mmHg. Tricuspid Valve: The tricuspid valve is grossly normal. Tricuspid valve regurgitation is mild. Aortic Valve: The aortic valve is tricuspid. There is moderate calcification of the aortic valve. There is mild to moderate aortic valve annular calcification. Aortic valve regurgitation is not visualized. Moderate to severe aortic stenosis is present. Aortic valve mean gradient measures 24.0 mmHg. Aortic valve peak gradient measures 42.1 mmHg. Aortic valve area, by VTI measures 1.06 cm. Pulmonic Valve: The pulmonic valve was grossly normal. Pulmonic valve regurgitation is trivial. Aorta: The aortic root is normal in size and structure and aortic dilatation noted. There is mild dilatation of the ascending aorta, measuring 39 mm. Venous: The inferior vena cava is dilated in size with less than 50% respiratory variability, suggesting right atrial pressure of 15 mmHg. IAS/Shunts: No atrial level shunt detected by color flow Doppler. Additional Comments: There is pleural effusion in the left lateral region.  LEFT VENTRICLE PLAX 2D LVIDd:         5.50 cm   Diastology LVIDs:         3.90 cm   LV e' lateral:   12.40 cm/s LV PW:         1.30 cm   LV E/e' lateral: 14.1 LV IVS:        1.40 cm LVOT diam:     2.00 cm LV SV:         79 LV SV Index:   36 LVOT Area:     3.14 cm  RIGHT VENTRICLE RV Basal diam:  4.35 cm RV Mid diam:    3.60 cm RV S prime:     11.70 cm/s TAPSE (M-mode): 2.2 cm LEFT ATRIUM              Index        RIGHT ATRIUM           Index LA diam:        5.50 cm  2.49  cm/m   RA Area:     26.20 cm LA Vol (A2C):   154.0 ml 69.72 ml/m  RA Volume:   85.70 ml  38.80 ml/m LA Vol (A4C):   188.0 ml 85.11 ml/m LA Biplane Vol: 176.0 ml 79.68 ml/m  AORTIC VALVE                     PULMONIC VALVE AV Area (Vmax):    1.13 cm      PV Vmax:       1.22 m/s AV Area (Vmean):   1.05 cm      PV Peak grad:  6.0 mmHg AV Area (VTI):     1.06 cm AV Vmax:           324.50 cm/s AV Vmean:          222.500 cm/s AV VTI:            0.751 m AV Peak Grad:      42.1 mmHg AV Mean Grad:      24.0 mmHg LVOT Vmax:         116.33 cm/s LVOT Vmean:        74.533 cm/s LVOT VTI:          0.253 m LVOT/AV VTI ratio: 0.34  AORTA Ao Root diam: 3.60 cm Ao Asc diam:  3.90 cm MITRAL VALVE MV Area (PHT): 4.83 cm     SHUNTS MV Area VTI:  2.08 cm     Systemic VTI:  0.25 m MV Peak grad:  15.8 mmHg    Systemic Diam: 2.00 cm MV Mean grad:  4.0 mmHg MV Vmax:       1.99 m/s MV Vmean:      82.4 cm/s MV Decel Time: 157 msec MV E velocity: 175.00 cm/s Rozann Lesches MD Electronically signed by Rozann Lesches MD Signature Date/Time: 09/25/2022/12:35:26 PM    Final     Assessment & Plan    1.  Acute on chronic HFpEF, LVEF 55 to 60% with moderate LVH.  Intake and output incomplete but weight down 2 pounds in the last 24 hours.  Currently on Lasix 40 mg IV twice daily with potassium supplement and stable renal function.  2.  Moderate to severe calcific, degenerative aortic stenosis, mean AV gradient 24 mmHg and dimensionless index 0.34.  Given heart failure symptoms and intermittent angina, question whether severity is underestimated (could be normal flow/low gradient).  3.  Recurring episodes of chest discomfort concerning for angina, although no clear evidence of ACS by cardiac enzymes.  He has known CAD status post CABG in 2003 and subsequent graft intervention in 2017.  Not on aspirin given use of Eliquis.  Also on Imdur and Lipitor.  4.  Persistent atrial fibrillation with CHA2DS2-VASc score of 5.  Has been on  Eliquis for stroke prophylaxis, also Cardizem CD and Toprol-XL for heart rate control.  5.  Macrocytic anemia, hemoglobin 7.5 today and MCV 118.  B12 normal.  No overt bleeding noted.  Stools negative so far.  Eliquis is on hold with plan for right and left heart catheterization to reassess coronary and graft anatomy and also further evaluate aortic stenosis for potential TAVR assessment.  He has macrocytic anemia as discussed above, no obvious bleeding noted and stools negative so far.  Hemoglobin is 7.5 today.  May need to get Hematology consultation in terms of etiology as this may have some bearing on his cardiac workup and candidacy for further procedures.  Not clear that he cannot undergo a diagnostic cardiac catheterization at this time however.  Signed, Rozann Lesches, MD  09/27/2022, 8:56 AM

## 2022-09-28 ENCOUNTER — Telehealth (HOSPITAL_COMMUNITY): Payer: Self-pay

## 2022-09-28 ENCOUNTER — Other Ambulatory Visit (HOSPITAL_COMMUNITY): Payer: Self-pay

## 2022-09-28 ENCOUNTER — Encounter (HOSPITAL_COMMUNITY): Payer: Self-pay | Admitting: Family Medicine

## 2022-09-28 DIAGNOSIS — I5031 Acute diastolic (congestive) heart failure: Secondary | ICD-10-CM | POA: Diagnosis not present

## 2022-09-28 DIAGNOSIS — I5033 Acute on chronic diastolic (congestive) heart failure: Secondary | ICD-10-CM | POA: Diagnosis not present

## 2022-09-28 LAB — CBC
HCT: 24 % — ABNORMAL LOW (ref 39.0–52.0)
Hemoglobin: 7.4 g/dL — ABNORMAL LOW (ref 13.0–17.0)
MCH: 36.8 pg — ABNORMAL HIGH (ref 26.0–34.0)
MCHC: 30.8 g/dL (ref 30.0–36.0)
MCV: 119.4 fL — ABNORMAL HIGH (ref 80.0–100.0)
Platelets: 186 10*3/uL (ref 150–400)
RBC: 2.01 MIL/uL — ABNORMAL LOW (ref 4.22–5.81)
RDW: 20.9 % — ABNORMAL HIGH (ref 11.5–15.5)
WBC: 8.1 10*3/uL (ref 4.0–10.5)
nRBC: 0 % (ref 0.0–0.2)

## 2022-09-28 LAB — PREPARE RBC (CROSSMATCH)

## 2022-09-28 LAB — BASIC METABOLIC PANEL
Anion gap: 6 (ref 5–15)
BUN: 12 mg/dL (ref 8–23)
CO2: 29 mmol/L (ref 22–32)
Calcium: 8.9 mg/dL (ref 8.9–10.3)
Chloride: 102 mmol/L (ref 98–111)
Creatinine, Ser: 0.98 mg/dL (ref 0.61–1.24)
GFR, Estimated: >60 mL/min (ref >60)
Glucose, Bld: 117 mg/dL — ABNORMAL HIGH (ref 70–99)
Potassium: 4.7 mmol/L (ref 3.5–5.1)
Sodium: 137 mmol/L (ref 135–145)

## 2022-09-28 LAB — HEMOGLOBIN AND HEMATOCRIT, BLOOD
HCT: 27.1 % — ABNORMAL LOW (ref 39.0–52.0)
Hemoglobin: 8.6 g/dL — ABNORMAL LOW (ref 13.0–17.0)

## 2022-09-28 MED ORDER — FUROSEMIDE 10 MG/ML IJ SOLN: 40.0000 mg | Freq: Two times a day (BID) | INTRAMUSCULAR | Status: DC

## 2022-09-28 MED ORDER — DAPAGLIFLOZIN PROPANEDIOL 10 MG PO TABS
10.0000 mg | ORAL_TABLET | Freq: Every day | ORAL | Status: DC
  Administered 2022-09-28 – 2022-10-01 (×4): 10 mg via ORAL
  Filled 2022-09-28 (×4): qty 1

## 2022-09-28 MED ORDER — POTASSIUM CHLORIDE CRYS ER 20 MEQ PO TBCR
30.0000 meq | EXTENDED_RELEASE_TABLET | Freq: Every day | ORAL | Status: DC
  Administered 2022-09-29 – 2022-10-01 (×3): 30 meq via ORAL
  Filled 2022-09-28 (×3): qty 1

## 2022-09-28 MED ORDER — SODIUM CHLORIDE 0.9% IV SOLUTION: Freq: Once | INTRAVENOUS | Status: AC

## 2022-09-28 MED ORDER — FUROSEMIDE 10 MG/ML IJ SOLN
80.0000 mg | Freq: Two times a day (BID) | INTRAMUSCULAR | Status: DC
  Administered 2022-09-28 – 2022-09-30 (×5): 80 mg via INTRAVENOUS
  Filled 2022-09-28 (×5): qty 8

## 2022-09-28 MED ORDER — APIXABAN 5 MG PO TABS
5.0000 mg | ORAL_TABLET | Freq: Two times a day (BID) | ORAL | Status: DC
  Administered 2022-09-28 – 2022-10-01 (×7): 5 mg via ORAL
  Filled 2022-09-28 (×7): qty 1

## 2022-09-28 MED ORDER — SPIRONOLACTONE 12.5 MG HALF TABLET
12.5000 mg | ORAL_TABLET | Freq: Every day | ORAL | Status: DC
  Administered 2022-09-28 – 2022-10-01 (×4): 12.5 mg via ORAL
  Filled 2022-09-28 (×4): qty 1

## 2022-09-28 MED FILL — Fentanyl Citrate Preservative Free (PF) Inj 100 MCG/2ML: INTRAMUSCULAR | Qty: 2 | Status: AC

## 2022-09-28 NOTE — TOC Progression Note (Signed)
Transition of Care [facility]) - Progression Note    Patient Details  Name: Leonard Cisneros MRN: [id number] Date of Birth: Dec 27, 1940  [facility] CM/SW Contact  Zenon Mayo, RN Phone Number: 09/28/2022, 12:43 PM  Clinical Narrative:    Patient is for possible dc tomorrow, he is set up with [facility] for [facility].  He will need to be weaned off oxygen prior to dc, if not may need home oxygen. TOC following.   Expected Discharge Plan: [place] Barriers to Discharge: Continued Medical Work up  Expected Discharge Plan and [place] arrangements for the past 2 months: [place] [employer] Housing: RN   Date [place]: 09/25/22 Time [place]: 1152 Representative spoke with at [place]: [place] Determinants of Health ([place]) Interventions SDOH Screenings   Food Insecurity: No Food Insecurity (09/24/2022)  Housing: Low Risk  (09/24/2022)  Transportation Needs: No Transportation Needs (09/24/2022)  Utilities: Not At Risk (09/24/2022)  Depression (PHQ2-9): Low Risk  (04/06/2022)  Financial Resource Strain: Low Risk  (04/21/2018)  Social Connections: Unknown (04/21/2018)  Stress: Stress Concern Present (04/21/2018)  Tobacco Use: Medium Risk (09/27/2022)    Readmission Risk Interventions    09/25/2022   12:42 PM  Readmission Risk Prevention Plan  Transportation Screening Complete  PCP or Specialist Appt within 5-7 Days Not Complete  Home Care Screening Complete  Medication Review (RN CM) Complete

## 2022-09-28 NOTE — H&P (Deleted)
   Heart Failure Stewardship Pharmacist Progress Note   PCP: Alvira Monday, FNP PCP-Cardiologist: Rozann Lesches, MD    HPI:  82 yo M with PMH: Aflutter, CAD s/p CABG x3 (2003) and subsequent PCI, HFpEF, HTN, HLD, moderate aortic stenosis, permanent AF on apixaban. Presented to ED reporting x12 day history of SOB and worsening LE edema despite self-increasing doses of torsemide. Endorsed cough, diminished appetite, weight gain, and intermittent chest pain. In ED was noted to have K 2.9, troponin 18, BNP 288. CXR showed evidence of pulmonary edema. Initiated on diuresis with IV furosemide.  ECHO showed LVEF 55-60% with moderate LV hypertrophy, normal RV, severe mitral annular calcification, and moderate calcification of the aortic valve with severe aortic stenosis. 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 Patient will be medically managed.  Current HF Medications: Beta blocker: Metoprolol succinate 100 mg daily MRA: Spironolactone 12.5 mg daily  SGLT2i: Dapagliflozin 10 mg daily  Diuretic: Furosemide 80 mg IV BID  Prior to admission HF Medications: Beta blocker: Metoprolol succinate 100 mg daily  Diuresis: Torsemide 20 mg BID + additional 40 mg PRN  Pertinent Lab Values (from 09/28/2022): Serum creatinine 0.98, BUN 12, Potassium 4.7, Sodium 137, BNP 247 (from 1/23), Magnesium 2.2 (from 1/25), A1c 5.4 (from 11/11/2021)  Vital Signs: Weight: 219 lbs (admission weight: 221 lbs) Blood pressure: 110s/50s  Heart rate: 70s-80s  I/O: +0.5 L yesterday; net +0.5 L  Outpatient Pharmacy:  Prior to admission outpatient [name] Is the patient willing to use [facility] pharmacy at discharge? Yes Is the patient willing to transition their outpatient pharmacy to utilize a [facility] outpatient pharmacy?   No  Assessment: 1. Acute on chronic HFpEF (LVEF 55-60%), due to ICM. NYHA class II  symptoms. - Patient will require diuresis and potentially uptitration of HFpEF medications.   Plan: 1) Medication changes recommended at this time: - Decrease potassium supplementation to 30 mEq once daily given K 4.7 and uptrending on spironolactone 12.5 mg daily.   2) Patient assistance: - Patient insured with [employer]. Copay for [place] is $47. Since patient also paying for Eliquis copays, will get set up with patient assistance for [place].  3)  Education  - Patient has been educated on current HF medications and potential additions to HF medication regimen - Patient verbalizes understanding that over the next few months, these medication doses may change and more medications may be added to optimize HF regimen - Patient has been educated on basic disease state pathophysiology and goals of therapy  Jeanmarie Hubert, PharmD Candidate

## 2022-09-28 NOTE — Progress Notes (Addendum)
Rounding Note    Patient Name: Leonard Cisneros Date of Encounter: 09/28/2022  [place] Cardiologist: Rozann Lesches, MD   Subjective   No acute overnight events. Patient states his shortness of breath has improved some but he is not back to baseline yet. He states he does not feel really short of breath but he just does not "sound right." It does sounds like he is mildly short of breath when he is talking to me. He is currently on 4L of O2 (was not on any at home). He still has lower tight lower extremeity edema. No chest pain.  Inpatient Medications    Scheduled Meds:  atorvastatin  80 mg Oral QPM   diltiazem  240 mg Oral Daily   folic acid  1 mg Oral Daily   heparin  5,000 Units Subcutaneous Q8H   isosorbide mononitrate  30 mg Oral Daily   metoprolol succinate  100 mg Oral Daily   pantoprazole (PROTONIX) IV  40 mg Intravenous Q12H   potassium chloride  30 mEq Oral BID   sodium chloride flush  3 mL Intravenous Q12H   sodium chloride flush  3 mL Intravenous Q12H   thiamine  100 mg Oral Daily   Continuous Infusions:  sodium chloride     PRN Meds: sodium chloride, acetaminophen **OR** acetaminophen, bisacodyl, fentaNYL (SUBLIMAZE) injection, ipratropium-albuterol, nitroGLYCERIN, ondansetron **OR** ondansetron (ZOFRAN) IV, oxyCODONE, sodium chloride flush, traZODone   Vital Signs    Vitals:   09/27/22 2000 09/28/22 0038 09/28/22 0242 09/28/22 0730  BP:  (!) 106/57  (!) 117/47  Pulse:    90  Resp: (!) 23   20  Temp:  98.7 F (37.1 C)  97.9 F (36.6 C)  TempSrc:  Oral  Oral  SpO2:    94%  Weight:   99.3 kg   Height:        Intake/Output Summary (Last 24 hours) at 09/28/2022 0802 Last data filed at 09/28/2022 0730 Gross per 24 hour  Intake 240 ml  Output 950 ml  Net -710 ml      09/28/2022    2:42 AM 09/27/2022    6:00 AM 09/26/2022    6:29 AM  Last 3 Weights  Weight (lbs) 219 lb 220 lb 12.8 oz 222 lb 3.6 oz  Weight (kg) 99.338 kg 100.154 kg  100.8 kg      Telemetry    Atrial fibrillation with rates in the 60s to 63s. - Personally Reviewed  ECG    No new ECG tracing today. - Personally Reviewed  Physical Exam   GEN: Elderly Caucasian male resting in no acute distress.   Neck: No JVD. Cardiac: Irregularly irregular rhythm with normal rate. Soft II/VI murmur noted. Respiratory: On 4L of O2 via nasal cannula. No significant increased work of breathing. Decreased breath sounds in bilateral bases. No wheezes, rhonchi, or rales appreciated. GI: Soft, non-distended, and non-tender. MS: Tight woody edema of bilateral lower extremities. No deformity. Skin: Warm and dry. Neuro:  No focal deficits. Psych: Normal affect. Responds appropriately.  Labs    High Sensitivity Troponin:   Recent Labs  Lab 09/24/22 1020 09/24/22 1242  TROPONINIHS 18* 18*     Chemistry Recent Labs  Lab 09/24/22 1431 09/25/22 0442 09/26/22 0500 09/27/22 0450 09/27/22 1539 09/27/22 1546 09/27/22 1748 09/28/22 0121  NA  --  135 135 136 138 138  137  --  137  K  --  3.6 4.0 3.9 4.0 4.2  4.2  --  4.7  CL  --  100 99 100  --   --   --  102  CO2  --  28 29 29   --   --   --  29  GLUCOSE  --  99 121* 99  --   --   --  117*  BUN  --  13 14 14   --   --   --  12  CREATININE  --  0.89 0.94 0.92  --   --  0.93 0.98  CALCIUM  --  8.2* 8.5* 8.4*  --   --   --  8.9  MG 1.9 2.0  --  2.2  --   --   --   --   PROT  --  6.0* 6.3* 6.2*  --   --   --   --   ALBUMIN  --  3.1* 3.3* 3.2*  --   --   --   --   AST  --  35 38 41  --   --   --   --   ALT  --  29 33 34  --   --   --   --   ALKPHOS  --  233* 250* 244*  --   --   --   --   BILITOT  --  3.8* 3.7* 3.3*  --   --   --   --   GFRNONAA  --  >60 >60 >60  --   --  >60 >60  ANIONGAP  --  7 7 7   --   --   --  6    Lipids No results for input(s): "CHOL", "TRIG", "HDL", "LABVLDL", "LDLCALC", "CHOLHDL" in the last 168 hours.  Hematology Recent Labs  Lab 09/27/22 0450 09/27/22 1539 09/27/22 1546  09/27/22 1748 09/28/22 0121  WBC 8.3  --   --  7.6 8.1  RBC 2.01*  --   --  2.01* 2.01*  HGB 7.5*   < > 7.5*  7.5* 7.6* 7.4*  HCT 23.8*   < > 22.0*  22.0* 23.4* 24.0*  MCV 118.4*  --   --  116.4* 119.4*  MCH 37.3*  --   --  37.8* 36.8*  MCHC 31.5  --   --  32.5 30.8  RDW 20.6*  --   --  20.7* 20.9*  PLT 173  --   --  180 186   < > = values in this interval not displayed.   Thyroid No results for input(s): "TSH", "FREET4" in the last 168 hours.  BNP Recent Labs  Lab 09/24/22 1020 09/25/22 0442  BNP 288.0* 247.0*    DDimer No results for input(s): "DDIMER" in the last 168 hours.   Radiology    CARDIAC CATHETERIZATION  Result Date: 09/27/2022   Ost LAD to Prox LAD lesion is 100% stenosed.   Ramus lesion is 100% stenosed.   Prox RCA to Mid RCA lesion is 60% stenosed.   Dist RCA lesion is 90% stenosed.   Origin to Prox Graft lesion is 100% stenosed.   LIMA graft was visualized by angiography and is large.   RIMA graft was visualized by non-selective angiography and is normal in caliber.   The graft exhibits no disease.   The graft exhibits no disease.   LV end diastolic pressure is mildly elevated.   Hemodynamic findings consistent with mild pulmonary hypertension.   There is moderate aortic valve stenosis. 3 vessel occlusive CAD. The  native LCx is widely patent LIMA to the LAD is patent RIMA to the PDA is patent SVG to the ramus intermediate is occluded. This appears to be chronic Mild to Moderate aortic stenosis. AV mean gradient 18.5 mm Hg. AVA 2.5 cm squared with index 1.15. Elevated LV filling pressures. LVEDP 20 mm Hg. Mean PCWP 24 mm Hg with large V waves to 42 mm Hg Mild pulmonary HTN. PAP 56/17 with mean 31 mm Hg. Plan: medical management. Continue diuresis. Would benefit greatly from correction of severe anemia. Will need to monitor Aortic stenosis serially going forward.    Cardiac Studies   Echocardiogram 09/25/2022: Impressions:  1. Left ventricular ejection fraction, by  estimation, is 55 to 60%. The  left ventricle has normal function. The left ventricle has no regional  wall motion abnormalities. There is moderate concentric left ventricular  hypertrophy. Left ventricular  diastolic parameters are indeterminate.   2. Right ventricular systolic function is normal. The right ventricular  size is mildly enlarged. Tricuspid regurgitation signal is inadequate for  assessing PA pressure.   3. Left atrial size was severely dilated.   4. Right atrial size was mildly dilated.   5. Left pleural effusion noted.   6. The mitral valve is degenerative. Mild mitral valve regurgitation.  Severe mitral annular calcification.   7. The aortic valve is tricuspid. There is moderate calcification of the  aortic valve. Aortic valve regurgitation is not visualized. Moderate to  severe aortic valve stenosis. Aortic valve mean gradient measures 24.0  mmHg. Dimentionless index 0.34.   8. Aortic dilatation noted. There is mild dilatation of the ascending  aorta, measuring 39 mm.   9. The inferior vena cava is dilated in size with <50% respiratory  variability, suggesting right atrial pressure of 15 mmHg.   Comparison(s): Prior images reviewed side by side. LVEF 55-60% range.  Degenerative, calcific aortic stenosis of moderate to severe range with  similar gradients as prior study.  _______________  Right/ Left Cardiac Catheterization 09/17/2022:   Ost LAD to Prox LAD lesion is 100% stenosed.   Ramus lesion is 100% stenosed.   Prox RCA to Mid RCA lesion is 60% stenosed.   Dist RCA lesion is 90% stenosed.   Origin to Prox Graft lesion is 100% stenosed.   LIMA graft was visualized by angiography and is large.   RIMA graft was visualized by non-selective angiography and is normal in caliber.   The graft exhibits no disease.   The graft exhibits no disease.   LV end diastolic pressure is mildly elevated.   Hemodynamic findings consistent with mild pulmonary hypertension.    There is moderate aortic valve stenosis.   3 vessel occlusive CAD. The native LCx is widely patent LIMA to the LAD is patent RIMA to the PDA is patent SVG to the ramus intermediate is occluded. This appears to be chronic Mild to Moderate aortic stenosis. AV mean gradient 18.5 mm Hg. AVA 2.5 cm squared with index 1.15. Elevated LV filling pressures. LVEDP 20 mm Hg. Mean PCWP 24 mm Hg with large V waves to 42 mm Hg Mild pulmonary HTN. PAP 56/17 with mean 31 mm Hg.    Plan: medical management. Continue diuresis. Would benefit greatly from correction of severe anemia. Will need to monitor Aortic stenosis serially going forward.   Diagnostic Dominance: Right      Patient Profile     82 y.o. male with CAD s/p CABG (LIMA to LAD, RIMA to distal RCA, SVG to ramus intermediate  vessel) x3 in 2003 with subsequent PCI to SVG to ramus intermediate graft in 2017, chronic HFpEF, permanent atrial fibrillation/ flutter s/p remote flutter ablation on Eliquis, moderate to severe aortic stenosis, hypertension, hyperlipidemia, and chronic back pain who was admitted to [facility] on 09/24/2022 for acute on chronic diastolic CHF after presenting with progressive shortness of breath and lower extremity edema. He also started having recurrent episodes of chest pain during admission. Repeat Echo showed normal LV function but progression of aortic stenosis to the moderate to severe range.  He was diuresed and then transferred to [facility] on 09/27/2022 for right/left cardiac catheterization.  Assessment & Plan    Acute on Chronic HFpEF Patient presented with shortness of breath and lower extremity edema. BNP elevated at 288. Chest x-ray showed stable cardiomegaly with mild central pulmonary vascular congestion and probable bibasilar pulmonary edema and small pleural effusions. Echo showed LVEF of 55-60% with no regional wall motion abnormalities and moderate LVH, normal RV function, severely dilated left  atrium, severe MAC with mild MR, and moderate to severe aortic stenosis with mean gradient of 24 mmHg and dimensionless index of 0.34. R/LHC on 1/25 showed chronic occlusion of SVG to ramus intermediate within prior stent but patent LIMA to LAD and RIMA to PDA. Also showed mild to moderate aortic stenosis with mean gradient of 18.5 mmHg and AVA of 2.5 cm^2 with index of 1.15. LV filling pressures were elevated - LVEDP 20 mmHg and mean PCWP 24 mmHG with large V waves to 42 mmHg. Continued diuresis was recommended. - Still on 4L of O2 (not on any at home). He has decreased breath sounds in bases and tight lower extremity edema. -  Last dose of IV Lasix was on 1/22. It does not look like output has been routine documented. Weight down 7 lbs from admission. Renal function stable. Will restart IV Lasix 11m twice daily. - May benefit from Spironolactone and SGLT2 inhibitor as well. Will discuss with MD. - Monitor daily weights, strict I/Os, and renal function.  Chest Pain CAD s/p CABG S/p remote CABG x3 in 2003 with subsequent PCI as described above. High-sensitivity troponin 18 >> 18 consistent with demand ischemia. Patient was having recurring episodes of chest pain concerning for angina. Cath showed chronic occlusion of SVG to ramus intermediate within prior stent but patent LIMA to LAD and RIMA to PDA.  Medical therapy was recommended. - No chest pain. - Continue antianginals: Imdur 383mdaily and Toprol-XL 10025maily. - No aspirin due to need for Eliquis. - Continue Lipitor 19m27mily. - Patient is severely anemic. Hemoglobin 7.4. May benefit from transfusion for hemoglobin goal >8.0. Will defer to primary team.  Permanent Atrial Fibrillation History of Atrial Flutter s/p Remote Ablation Rate controlled. Rates in the 60s 74s70s.55sContinue Cardizem CD 240mg34mly and Toprol-XL 100mg 68my. - On chronic anticoagulation with Eliquis. This was held for cardiac catheterization but should be able to  restart today. Will confirm with MD.  Aortic Stenosis Echo this admission showed  moderate to severe aortic stenosis with mean gradient of 24 mmHg and dimensionless index of 0.34. However, only appeared to be mild to moderate on cardiac catheterization with mean gradient of 18.5 mmHg and AVA of 2.5 cm^2 with index of 1.15. - Can continue to monitor as an outpatient.  Hypertension BP soft at times but stable. - Continue current medications: Imdur 30mg d102m, Toprol-XL 100mg da8m Cardizem CD 240 mg daily.  Hyperlipidemia - Continue Lipitor 19mg77m  daily.  Macrocytic Anemia Hemoglobin 8.1 on admission. Baseline looks to be in the 10 range. Folate and Vitamin B12 normal. Iron 147 and ferritin 319. Retic count 2.9. Hemmocult pending. - Hemoglobin 7.4 today. MCV 119. - Management per primary team. May need a transfusion to get hemoglobin above 8.    For questions or updates, please contact [facility] Please consult [email or web address] for contact info under    Signed, Darreld Mclean, PA-C  09/28/2022, 8:02 AM   As above, patient seen and examined.  He denies dyspnea but remains volume overloaded.  There is no chest pain.  Cardiac catheterization results noted and plan will be medical therapy for his coronary artery disease.  His aortic stenosis is likely moderate and will need follow-up as an outpatient.  He remains significantly volume overloaded and blood pressure is borderline.  Discontinue Imdur.  Increase Lasix to 80 mg IV twice daily, add spironolactone 12.5 mg daily and Farxiga 10 mg daily.  Follow renal function closely.  Continue Cardizem and metoprolol for rate control of atrial fibrillation and resume apixaban.  He is significantly anemic.  Would transfuse to hemoglobin greater than 8.  Likely needs hematology evaluation for macrocytic anemia. Will leave to primary care. Kirk Ruths

## 2022-09-28 NOTE — Telephone Encounter (Signed)
[name] farxiga 10 mg application completed and faxed to [phone]. Form sent for scanning.

## 2022-09-28 NOTE — Care Management Important Message (Signed)
Important Message  Patient Details  Name: DARCEY DEMMA MRN: [id number] Date of Birth: 1940/10/11   Medicare Important Message Given:  Yes     Shelda Altes 09/28/2022, 8:47 AM

## 2022-09-28 NOTE — Progress Notes (Signed)
1U PRBC ordered. RN at bedside, went over blood consent. Patient verbalize understanding and has no questions for RN to answer. Patient has relayed to RN that he does not remember if he has blood in the past and does not remember if he has a reaction from previous administration. Blood consent in chart.

## 2022-09-28 NOTE — Progress Notes (Signed)
Heart Failure Stewardship Pharmacist Progress Note     PCP: Alvira Monday, FNP PCP-Cardiologist: Rozann Lesches, MD      HPI:  82 yo M with PMH: Aflutter, CAD s/p CABG x3 (2003) and subsequent PCI, HFpEF, HTN, HLD, moderate aortic stenosis, permanent AF on apixaban. Presented to ED reporting x12 day history of SOB and worsening LE edema despite self-increasing doses of torsemide. Endorsed cough, diminished appetite, weight gain, and intermittent chest pain. In ED was noted to have K 2.9, troponin 18, BNP 288. CXR showed evidence of pulmonary edema. Initiated on diuresis with IV furosemide.   ECHO showed LVEF 55-60% with moderate LV hypertrophy, normal RV, severe mitral annular calcification, and moderate calcification of the aortic valve with severe aortic stenosis. 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 Patient will be medically managed.   Current HF Medications: Beta blocker: Metoprolol succinate 100 mg daily MRA: Spironolactone 12.5 mg daily  SGLT2i: Dapagliflozin 10 mg daily  Diuretic: Furosemide 80 mg IV BID   Prior to admission HF Medications: Beta blocker: Metoprolol succinate 100 mg daily  Diuresis: Torsemide 20 mg BID + additional 40 mg PRN   Pertinent Lab Values (from 09/28/2022): Serum creatinine 0.98, BUN 12, Potassium 4.7, Sodium 137, BNP 247 (from 1/23), Magnesium 2.2 (from 1/25), A1c 5.4 (from 11/11/2021)   Vital Signs: Weight: 219 lbs (admission weight: 221 lbs) Blood pressure: 110s/50s  Heart rate: 70s-80s  I/O: +0.5 L yesterday; net +0.5 L   Outpatient Pharmacy:  Prior to admission outpatient [name] Is the patient willing to use [place] pharmacy at discharge? Yes Is the patient willing to transition their outpatient pharmacy to utilize a [facility] outpatient pharmacy?   No   Assessment: 1. Acute on chronic HFpEF (LVEF 55-60%), due to ICM. NYHA  class II symptoms. - Patient will require diuresis and potentially uptitration of HFpEF medications.   Plan: 1) Medication changes recommended at this time: - Decrease potassium supplementation to 30 mEq once daily given K 4.7 and uptrending on spironolactone 12.5 mg daily.    2) Patient assistance: - Patient insured with [employer]. Copay for [place] is $47. Since patient also paying for Eliquis copays, will get set up with patient assistance for [place].   3)  Education  - Patient has been educated on current HF medications and potential additions to HF medication regimen - Patient verbalizes understanding that over the next few months, these medication doses may change and more medications may be added to optimize HF regimen - Patient has been educated on basic disease state pathophysiology and goals of therapy   Jeanmarie Hubert, PharmD Candidate

## 2022-09-28 NOTE — TOC Benefit Eligibility Note (Signed)
Patient Teacher, English as a foreign language completed.    The patient is currently admitted and upon discharge could be taking Farxiga 10 mg.  The current 30 day co-pay is $47.00.   The patient is insured through [employer], [place] Patient Advocate Specialist [place] Patient Advocate Team Direct Number: [phone]  Fax: [phone]

## 2022-09-28 NOTE — Progress Notes (Signed)
PROGRESS NOTE    Leonard Cisneros  OVZ:[id number] DOB: [birth year] DOA: 09/24/2022 PCP: Alvira Monday, FNP   Brief Narrative:  82 year old gentleman with history of a flutter, ischemic cardiomyopathy status post CABG x 2, coronary artery disease, HFpEF, hypertension, hyperlipidemia, moderate aortic stenosis, permanent atrial fibrillation on apixaban presented with shortness of breath and increasing edema in the legs.  He has been taking Demadex and increasing the doses at home but it has not been able to control his symptoms although he has had an increase in urination since increasing the doses.  He reports having a cough, diminished appetite, weight gain and intermittent chest pain episodes.   He was evaluated in the emergency department and noted to have a hemoglobin down to 8.1, potassium down to 2.9, bilirubin elevated at 3.6, sodium down to 131, BNP 288, troponin 18, 18. Chest xray with findings of pulmonary edema.  He was started on IV lasix and admission requested for further diuresis and cardiology consultation.   Patient slowly improved clinically with IV lasix, but continued to have intermittent chest discomfort.  As a result, cardiology requested transfer to [facility] for right and left heart cath.   Assessment & Plan:   Principal Problem:   Acute heart failure (HCC) Active Problems:   Hx of CABG   Acute on chronic heart failure with preserved ejection fraction (HCC)   Severe pulmonary hypertension (HCC)   Essential hypertension   PAD (peripheral artery disease) (HCC)   ETOH abuse   High risk medication use   Impaired glucose metabolism   Obesity (BMI 30-39.9)   Neuropathic pain   Exertional angina   Hyperlipidemia LDL goal <70   CAD S/P percutaneous coronary angioplasty   Ischemic cardiomyopathy   Physical deconditioning   Gait instability   Persistent atrial fibrillation (HCC)   Moderate aortic stenosis   Heart failure (HCC)   Hypokalemia   Hyponatremia    Elevated liver enzymes   Hyperbilirubinemia   Volume overload   Acquired thrombophilia (HCC)   Microcytic anemia   Acute respiratory failure with hypoxia (HCC)   Nonrheumatic aortic valve stenosis   Unstable angina (HCC)  Acute hypoxic respiratory failure secondary to acute on chronic HFpEF/moderate to severe aortic stenosis: pt failed outpatient management with increasing torsemide doses at home. . Echo showed LVEF of 55-60% with no regional wall motion abnormalities and moderate LVH, normal RV function, severely dilated left atrium, severe MAC with mild MR, and moderate to severe aortic stenosis with mean gradient of 24 mmHg and dimensionless index of 0.34. R/LHC on 1/25 showed chronic occlusion of SVG to ramus intermediate within prior stent but patent LIMA to LAD and RIMA to PDA. Also showed mild to moderate aortic stenosis with mean gradient of 18.5 mmHg and AVA of 2.5 cm^2 with index of 1.15. LV filling pressures were elevated - LVEDP 20 mmHg and mean PCWP 24 mmHG with large V waves to 42 mmHg. Continued diuresis was recommended.  Still requiring 4 L of oxygen, does not use home oxygen.  Cardiology on board and managing this.  They have increased his Lasix to 80 mg IV twice daily and added Aldactone 12.5 mg and Farxiga 10 mg p.o. daily on 09/28/2022.  Chronic/permanent Atrial Fibrillation: Rate controlled, continue Cardizem CD 240 mg p.o. daily and Toprol-XL 100 mg p.o. daily.  Continue Eliquis.  Hyperlipidemia: Continue atorvastatin.   Hyperbilirubinemia: Acute on chronic, around 3.3 but has remained stable over the last several days. - abd US--no biliary ductal dilatation -  suspect Gilbert's - hx of Etoh abuse   Hyponatremia: Resolved.   Etoh dependence -drinks 2 beers daily -previous liquor -no signs of withdrawal during hospitalization Continue thiamine.   Hypokalemia: Resolved.  Acute on chronic macrocytic anemia - follow up stool guaiac testing --ordered - B12 553 - folate  8.6 - iron saturation 56%, ferritin 310 - indices consistent with AOCD in setting of chronic Etoh use.  His baseline hemoglobin appears to be around 10, currently 7.4 and symptomatic and due to severe CAD, would prefer to keep his hemoglobin over 8 and will transfuse him 1 unit of PRBC which is also recommended by cardiology. - start pantoprazole empirically   Hypertension -metoprolol succinate and diltiazem CD   Acquired thrombophilia - continue apixaban for now   Obesity -BMI 30.99 -lifestyle modification and dietary modification counseled.  DVT prophylaxis: Place TED hose Start: 09/24/22 1429 Eliquis   Code Status: Full Code  Family Communication:  None present at bedside.  Plan of care discussed with patient in length and he/she verbalized understanding and agreed with it.  Status is: Inpatient Remains inpatient appropriate because: Needs further IV diuresis.   Estimated body mass index is 30.54 kg/m as calculated from the following:   Height as of this encounter: 5' 11"  (1.803 m).   Weight as of this encounter: 99.3 kg.    Nutritional Assessment: Body mass index is 30.54 kg/m.[name] Seen by dietician.  I agree with the assessment and plan as outlined below: Nutrition Status:        . Skin Assessment: I have examined the patient's skin and I agree with the wound assessment as performed by the wound care RN as outlined below:    Consultants:  Cardiology  Procedures:  None  Antimicrobials:  Anti-infectives (From admission, onward)    None         Subjective: Patient seen and examined.  He states that he is just not feeling well but denied any chest pain or shortness of breath.  He feels weak and lack of energy.  Objective: Vitals:   09/28/22 0242 09/28/22 0730 09/28/22 0858 09/28/22 1031  BP:  (!) 117/47 (!) 116/58 (!) 110/54  Pulse:  90 73 76  Resp:  20 18 20   Temp:  97.9 F (36.6 C)  98.1 F (36.7 C)  TempSrc:  Oral  Oral  SpO2:  94% 95% 96%   Weight: 99.3 kg     Height:        Intake/Output Summary (Last 24 hours) at 09/28/2022 1119 Last data filed at 09/28/2022 0858 Gross per 24 hour  Intake 560 ml  Output 950 ml  Net -390 ml   Filed Weights   09/26/22 0629 09/27/22 0600 09/28/22 0242  Weight: 100.8 kg 100.2 kg 99.3 kg    Examination:  General exam: Appears calm and comfortable  Respiratory system: Clear to auscultation. Respiratory effort normal. Cardiovascular system: S1 & S2 heard, RRR. No JVD, murmurs, rubs, gallops or clicks.  Trace pitting edema left lower extremity and +1 pitting edema right lower extremity. Gastrointestinal system: Abdomen is nondistended, soft and nontender. No organomegaly or masses felt. Normal bowel sounds heard. Central nervous system: Alert and oriented. No focal neurological deficits. Extremities: Symmetric 5 x 5 power. Skin: No rashes, lesions or ulcers Psychiatry: Judgement and insight appear normal. Mood & affect appropriate.    Data Reviewed: I have personally reviewed following labs and imaging studies  CBC: Recent Labs  Lab 09/25/22 0442 09/26/22 0500 09/27/22 0450 09/27/22 1539  09/27/22 1546 09/27/22 1748 09/28/22 0121  WBC 8.0 8.6 8.3  --   --  7.6 8.1  HGB 7.6* 8.0* 7.5* 7.1* 7.5*  7.5* 7.6* 7.4*  HCT 23.9* 24.8* 23.8* 21.0* 22.0*  22.0* 23.4* 24.0*  MCV 117.2* 117.5* 118.4*  --   --  116.4* 119.4*  PLT 176 182 173  --   --  180 099   Basic Metabolic Panel: Recent Labs  Lab 09/24/22 1020 09/24/22 1431 09/25/22 0442 09/26/22 0500 09/27/22 0450 09/27/22 1539 09/27/22 1546 09/27/22 1748 09/28/22 0121  NA 131*  --  135 135 136 138 138  137  --  137  K 2.9*  --  3.6 4.0 3.9 4.0 4.2  4.2  --  4.7  CL 94*  --  100 99 100  --   --   --  102  CO2 26  --  28 29 29   --   --   --  29  GLUCOSE 120*  --  99 121* 99  --   --   --  117*  BUN 15  --  13 14 14   --   --   --  12  CREATININE 0.85  --  0.89 0.94 0.92  --   --  0.93 0.98  CALCIUM 8.4*  --  8.2* 8.5*  8.4*  --   --   --  8.9  MG  --  1.9 2.0  --  2.2  --   --   --   --    GFR: Estimated Creatinine Clearance: 71 mL/min (by C-G formula based on SCr of 0.98 mg/dL). Liver Function Tests: Recent Labs  Lab 09/24/22 1020 09/25/22 0442 09/26/22 0500 09/27/22 0450  AST 33 35 38 41  ALT 29 29 33 34  ALKPHOS 266* 233* 250* 244*  BILITOT 3.6* 3.8* 3.7* 3.3*  PROT 6.4* 6.0* 6.3* 6.2*  ALBUMIN 3.4* 3.1* 3.3* 3.2*   No results for input(s): "LIPASE", "AMYLASE" in the last 168 hours. No results for input(s): "AMMONIA" in the last 168 hours. Coagulation Profile: Recent Labs  Lab 09/27/22 0450  INR 1.6*   Cardiac Enzymes: No results for input(s): "CKTOTAL", "CKMB", "CKMBINDEX", "TROPONINI" in the last 168 hours. BNP (last 3 results) No results for input(s): "PROBNP" in the last 8760 hours. HbA1C: No results for input(s): "HGBA1C" in the last 72 hours. CBG: No results for input(s): "GLUCAP" in the last 168 hours. Lipid Profile: No results for input(s): "CHOL", "HDL", "LDLCALC", "TRIG", "CHOLHDL", "LDLDIRECT" in the last 72 hours. Thyroid Function Tests: No results for input(s): "TSH", "T4TOTAL", "FREET4", "T3FREE", "THYROIDAB" in the last 72 hours. Anemia Panel: No results for input(s): "VITAMINB12", "FOLATE", "FERRITIN", "TIBC", "IRON", "RETICCTPCT" in the last 72 hours. Sepsis Labs: No results for input(s): "PROCALCITON", "LATICACIDVEN" in the last 168 hours.  No results found for this or any previous visit (from the past 240 hour(s)).   Radiology Studies: CARDIAC CATHETERIZATION  Result Date: 09/27/2022   Ost LAD to Prox LAD lesion is 100% stenosed.   Ramus lesion is 100% stenosed.   Prox RCA to Mid RCA lesion is 60% stenosed.   Dist RCA lesion is 90% stenosed.   Origin to Prox Graft lesion is 100% stenosed.   LIMA graft was visualized by angiography and is large.   RIMA graft was visualized by non-selective angiography and is normal in caliber.   The graft exhibits no disease.    The graft exhibits no disease.   LV end diastolic  pressure is mildly elevated.   Hemodynamic findings consistent with mild pulmonary hypertension.   There is moderate aortic valve stenosis. 3 vessel occlusive CAD. The native LCx is widely patent LIMA to the LAD is patent RIMA to the PDA is patent SVG to the ramus intermediate is occluded. This appears to be chronic Mild to Moderate aortic stenosis. AV mean gradient 18.5 mm Hg. AVA 2.5 cm squared with index 1.15. Elevated LV filling pressures. LVEDP 20 mm Hg. Mean PCWP 24 mm Hg with large V waves to 42 mm Hg Mild pulmonary HTN. PAP 56/17 with mean 31 mm Hg. Plan: medical management. Continue diuresis. Would benefit greatly from correction of severe anemia. Will need to monitor Aortic stenosis serially going forward.    Scheduled Meds:  sodium chloride   Intravenous Once   apixaban  5 mg Oral BID   atorvastatin  80 mg Oral QPM   dapagliflozin propanediol  10 mg Oral Daily   diltiazem  240 mg Oral Daily   folic acid  1 mg Oral Daily   furosemide  80 mg Intravenous BID   metoprolol succinate  100 mg Oral Daily   pantoprazole (PROTONIX) IV  40 mg Intravenous Q12H   [START ON 09/29/2022] potassium chloride  30 mEq Oral Daily   sodium chloride flush  3 mL Intravenous Q12H   sodium chloride flush  3 mL Intravenous Q12H   spironolactone  12.5 mg Oral Daily   thiamine  100 mg Oral Daily   Continuous Infusions:  sodium chloride       LOS: 4 days   Darliss Cheney, MD [facility]  09/28/2022, 11:19 AM   *Please note that this is a verbal dictation therefore any spelling or grammatical errors are due to the "[place] [employer]" system interpretation.  Please page via [place] and do not message via secure chat for urgent patient care matters. Secure chat can be used for non urgent patient care matters.  How to contact the [facility] Attending or Consulting provider [place] or covering provider during after hours [place], for this patient?  Check the care  team in [facility] and look for a) attending/consulting TR[facility] provider listed and b) the [facility] team listed. Page or secure chat [phone]. Log into [email or web address] and use [place]'s universal password to access. If you do not have the password, please contact the hospital operator. Locate the [facility] provider you are looking for under [facility] and page to a number that you can be directly reached. If you still have difficulty reaching the provider, please page the [facility] (Director on Call) for the Hospitalists listed on amion for assistance.

## 2022-09-28 NOTE — Progress Notes (Signed)
Heart Failure Nurse Navigator Progress Note  PCP: Alvira Monday, FNP PCP-Cardiologist: Domenic Polite Admission Diagnosis: None Admitted from: Home  Presentation:   Leonard Cisneros presented with shortness of breath for 12 days, BLE edema up to his groin +2, cough, weight gain with decreased appetite. Cardiologist recently increased his diuretic, however still has significant edema. BP 117/58, HR 75, BMI 31.52, BNP 247.K= 2.9, EKG showed Atrial fibrillation, IV lasix, potassium, and protonix given.   Patient was educated on the sign and symptoms of heart failure, daily weights, when to call his doctor or go to the ED, Diet/ fluid restrictions, taking all medications as prescribed and attending all medical appointments. Patient verbalized his understanding, a HF TOC appointment was scheduled for 10/17/2022 @ 10 am.   ECHO/ LVEF: 55-60% HFpEF  Clinical Course:  Past Medical History:  Diagnosis Date   Aortic stenosis    Atrial flutter (HCC)    a. s/p remote ablation by Dr. [name].   CAD (coronary artery disease)    a. s/p CABG 2003. b. 01/2016: unstable angina - s/p BMS to SVG-ramus intermediate, patent LIMA-mLAD, patent RIMA-dRCA.    Chronic lower back pain    Essential hypertension    Hyperlipidemia    Ischemic cardiomyopathy    a. Cath 01/2016 -  EF 50-55% with mild distal inferior hypocontractility.   Peripheral neuropathy 04/04/2014   Permanent atrial fibrillation ([place])      Social History   Socioeconomic History   Marital status: Married    Spouse name: Jeani Hawking   Number of children: 2   Years of education: Not on file   Highest education level: Bachelor's degree (e.g., BA, AB, BS)  Occupational History   Occupation: PT Tourist information centre manager: RETIRED  Tobacco Use   Smoking status: Former    Packs/day: 0.50    Years: 2.00    Total pack years: 1.00    Types: Cigarettes    Quit date: 09/03/1966    Years since quitting: 56.1   Smokeless tobacco: Never  Vaping Use    Vaping Use: Never used  Substance and Sexual Activity   Alcohol use: Yes    Alcohol/week: 11.0 standard drinks of alcohol    Types: 7 Glasses of wine, 4 Cans of beer per week    Comment: a glass of wine with dinner/ beer on the weekend   Drug use: No   Sexual activity: Not on file  Other Topics Concern   Not on file  Social History Narrative   Lives with his wife.   Social Determinants of Health   Financial Resource Strain: Low Risk  (09/28/2022)   Overall Financial Resource Strain (CARDIA)    Difficulty of Paying Living Expenses: Not hard at all  Food Insecurity: No Food Insecurity (09/24/2022)   Hunger Vital Sign    Worried About Running Out of Food in the Last Year: Never true    Ran Out of Food in the Last Year: Never true  Transportation Needs: No Transportation Needs (09/24/2022)   PRAPARE - Hydrologist (Medical): No    Lack of Transportation (Non-Medical): No  Physical Activity: Not on file  Stress: Stress Concern Present (04/21/2018)   [place]    Feeling of Stress : To some extent  Social Connections: Unknown (04/21/2018)   Social Connection and Isolation Panel [NHANES]    Frequency of Communication with Friends and Family: More than three times a  week    Frequency of Social Gatherings with Friends and Family: Three times a week    Attends Religious Services: Patient refused    Active Member of Clubs or Organizations: Patient refused    Attends Archivist Meetings: Patient refused    Marital Status: Married   Teacher, early years/pre and Provision:  Detailed education and instructions provided on heart failure disease management including the following:  Signs and symptoms of Heart Failure When to call the physician Importance of daily weights Low sodium diet Fluid restriction Medication management Anticipated future follow-up appointments  Patient education  given on each of the above topics.  Patient acknowledges understanding via teach back method and acceptance of all instructions.  Education Materials:  "Living Better With Heart Failure" Booklet, HF zone tool, & Daily Weight Tracker Tool.  Patient has scale at home: yes Patient has pill box at home: yes     High Risk Criteria for Readmission and/or Poor Patient Outcomes: Heart failure hospital admissions (last 6 months): 1  No Show rate: 1 % Difficult social situation: No Demonstrates medication adherence: Yes Primary Language: English Literacy level: Reading, writing, and comprehension  Barriers of Care:   Diet/ fluid restrictions ( salt and soda) Daily weights Continued HF education  Considerations/Referrals:   Referral made to Heart Failure Pharmacist Stewardship: yes, patient assistance Referral made to Heart Failure CSW/NCM TOC: No Referral made to Heart & Vascular TOC clinic: Yes  Items for Follow-up on DC/TOC: Diet/ fluid restrictions ( salt and soda) Daily weights Continued HF education   Earnestine Leys, BSN, RN Heart Failure Leisure centre manager Chat Only

## 2022-09-28 NOTE — Telephone Encounter (Signed)
Heart Failure Patient Advocate Encounter  Medication assistance forms started for Farxiga ([name]). Patient has signed forms and they have been scanned into the 'MEDIA' tab.  Routing to cardiologist to complete MD portion and submit.  Clista Bernhardt, CPhT Rx Patient Advocate Phone: [phone]

## 2022-09-29 DIAGNOSIS — I5031 Acute diastolic (congestive) heart failure: Secondary | ICD-10-CM | POA: Diagnosis not present

## 2022-09-29 DIAGNOSIS — I5033 Acute on chronic diastolic (congestive) heart failure: Secondary | ICD-10-CM | POA: Diagnosis not present

## 2022-09-29 LAB — CBC WITH DIFFERENTIAL/PLATELET
Abs Immature Granulocytes: 0.05 10*3/uL (ref 0.00–0.07)
Basophils Absolute: 0 10*3/uL (ref 0.0–0.1)
Basophils Relative: 0 %
Eosinophils Absolute: 0.4 10*3/uL (ref 0.0–0.5)
Eosinophils Relative: 5 %
HCT: 26 % — ABNORMAL LOW (ref 39.0–52.0)
Hemoglobin: 8.5 g/dL — ABNORMAL LOW (ref 13.0–17.0)
Immature Granulocytes: 1 %
Lymphocytes Relative: 12 %
Lymphs Abs: 1.1 10*3/uL (ref 0.7–4.0)
MCH: 36.8 pg — ABNORMAL HIGH (ref 26.0–34.0)
MCHC: 32.7 g/dL (ref 30.0–36.0)
MCV: 112.6 fL — ABNORMAL HIGH (ref 80.0–100.0)
Monocytes Absolute: 0.8 10*3/uL (ref 0.1–1.0)
Monocytes Relative: 9 %
Neutro Abs: 6.3 10*3/uL (ref 1.7–7.7)
Neutrophils Relative %: 73 %
Platelets: 164 10*3/uL (ref 150–400)
RBC: 2.31 MIL/uL — ABNORMAL LOW (ref 4.22–5.81)
RDW: 23.6 % — ABNORMAL HIGH (ref 11.5–15.5)
WBC: 8.6 10*3/uL (ref 4.0–10.5)
nRBC: 0 % (ref 0.0–0.2)

## 2022-09-29 LAB — BASIC METABOLIC PANEL
Anion gap: 8 (ref 5–15)
BUN: 11 mg/dL (ref 8–23)
CO2: 29 mmol/L (ref 22–32)
Calcium: 8.9 mg/dL (ref 8.9–10.3)
Chloride: 100 mmol/L (ref 98–111)
Creatinine, Ser: 1.07 mg/dL (ref 0.61–1.24)
GFR, Estimated: >60 mL/min (ref >60)
Glucose, Bld: 138 mg/dL — ABNORMAL HIGH (ref 70–99)
Potassium: 4.1 mmol/L (ref 3.5–5.1)
Sodium: 137 mmol/L (ref 135–145)

## 2022-09-29 LAB — BPAM RBC
Blood Product Expiration Date: 202403012359
ISSUE DATE / TIME: 202401261336
Unit Type and Rh: 5100

## 2022-09-29 LAB — MAGNESIUM: Magnesium: 2.2 mg/dL (ref 1.7–2.4)

## 2022-09-29 LAB — HAPTOGLOBIN: Haptoglobin: 65 mg/dL (ref 38–329)

## 2022-09-29 LAB — TYPE AND SCREEN
ABO/RH(D): O POS
Antibody Screen: NEGATIVE
Unit division: 0

## 2022-09-29 LAB — LIPOPROTEIN A (LPA): Lipoprotein (a): 25.3 nmol/L (ref <75.0)

## 2022-09-29 NOTE — Progress Notes (Signed)
PROGRESS NOTE    Leonard Cisneros  ZOX:[id number] DOB: [birth year] DOA: 09/24/2022 PCP: Alvira Monday, FNP   Brief Narrative:  82 year old gentleman with history of a flutter, ischemic cardiomyopathy status post CABG x 2, coronary artery disease, HFpEF, hypertension, hyperlipidemia, moderate aortic stenosis, permanent atrial fibrillation on apixaban presented with shortness of breath and increasing edema in the legs.  He has been taking Demadex and increasing the doses at home but it has not been able to control his symptoms although he has had an increase in urination since increasing the doses.  He reports having a cough, diminished appetite, weight gain and intermittent chest pain episodes.   He was evaluated in the emergency department and noted to have a hemoglobin down to 8.1, potassium down to 2.9, bilirubin elevated at 3.6, sodium down to 131, BNP 288, troponin 18, 18. Chest xray with findings of pulmonary edema.  He was started on IV lasix and admission requested for further diuresis and cardiology consultation.   Patient slowly improved clinically with IV lasix, but continued to have intermittent chest discomfort.  As a result, cardiology requested transfer to [facility] for right and left heart cath.   Assessment & Plan:   Principal Problem:   Acute heart failure (HCC) Active Problems:   Hx of CABG   Acute on chronic heart failure with preserved ejection fraction (HCC)   Severe pulmonary hypertension (HCC)   Essential hypertension   PAD (peripheral artery disease) (HCC)   ETOH abuse   High risk medication use   Impaired glucose metabolism   Obesity (BMI 30-39.9)   Neuropathic pain   Exertional angina   Hyperlipidemia LDL goal <70   CAD S/P percutaneous coronary angioplasty   Ischemic cardiomyopathy   Physical deconditioning   Gait instability   Persistent atrial fibrillation (HCC)   Moderate aortic stenosis   Heart failure (HCC)   Hypokalemia   Hyponatremia    Elevated liver enzymes   Hyperbilirubinemia   Volume overload   Acquired thrombophilia (HCC)   Microcytic anemia   Acute respiratory failure with hypoxia (HCC)   Nonrheumatic aortic valve stenosis   Unstable angina (HCC)  Acute hypoxic respiratory failure secondary to acute on chronic HFpEF/moderate to severe aortic stenosis: pt failed outpatient management with increasing torsemide doses at home. . Echo showed LVEF of 55-60% with no regional wall motion abnormalities and moderate LVH, normal RV function, severely dilated left atrium, severe MAC with mild MR, and moderate to severe aortic stenosis with mean gradient of 24 mmHg and dimensionless index of 0.34. R/LHC on 1/25 showed chronic occlusion of SVG to ramus intermediate within prior stent but patent LIMA to LAD and RIMA to PDA. Also showed mild to moderate aortic stenosis with mean gradient of 18.5 mmHg and AVA of 2.5 cm^2 with index of 1.15. LV filling pressures were elevated - LVEDP 20 mmHg and mean PCWP 24 mmHG with large V waves to 42 mmHg. Continued diuresis was recommended.  Still requiring 2 L of oxygen, does not use home oxygen.  Cardiology on board and managing this.  They have increased his Lasix to 80 mg IV twice daily and added Aldactone 12.5 mg and Farxiga 10 mg p.o. daily on 09/28/2022.  Chronic/permanent Atrial Fibrillation: Rate controlled, continue Cardizem CD 240 mg p.o. daily and Toprol-XL 100 mg p.o. daily.  Continue Eliquis.  Hyperlipidemia: Continue atorvastatin.   Hyperbilirubinemia: Acute on chronic, around 3.3 but has remained stable over the last several days. - abd US--no biliary ductal dilatation -  suspect Gilbert's - hx of Etoh abuse   Hyponatremia: Resolved.   Etoh dependence -drinks 2 beers daily -previous liquor -no signs of withdrawal during hospitalization Continue thiamine.   Hypokalemia: Resolved.  Acute on chronic macrocytic anemia - follow up stool guaiac testing --ordered - B12 553 - folate  8.6 - iron saturation 56%, ferritin 310 - indices consistent with AOCD in setting of chronic Etoh use.  His baseline hemoglobin appears to be around 10, currently 7.4 and symptomatic and due to severe CAD, would prefer to keep his hemoglobin over 8 and will transfuse him 1 unit of PRBC which is also recommended by cardiology. - start pantoprazole empirically   Hypertension -Blood pressure remains on the low normal side, continue metoprolol succinate and diltiazem CD   Acquired thrombophilia - continue apixaban for now   Obesity -BMI 30.99 -lifestyle modification and dietary modification counseled.  DVT prophylaxis: Place TED hose Start: 09/24/22 1429 Eliquis   Code Status: Full Code  Family Communication:  None present at bedside.  Plan of care discussed with patient in length and he/she verbalized understanding and agreed with it.  Status is: Inpatient Remains inpatient appropriate because: Needs further IV diuresis.   Estimated body mass index is 29.43 kg/m as calculated from the following:   Height as of this encounter: 5' 11"  (1.803 m).   Weight as of this encounter: 95.7 kg.    Nutritional Assessment: Body mass index is 29.43 kg/m.[name] Seen by dietician.  I agree with the assessment and plan as outlined below: Nutrition Status:        . Skin Assessment: I have examined the patient's skin and I agree with the wound assessment as performed by the wound care RN as outlined below:    Consultants:  Cardiology  Procedures:  None  Antimicrobials:  Anti-infectives (From admission, onward)    None         Subjective:  Seen and examined.  He has no complaints.  He feels better.  Objective: Vitals:   09/28/22 1620 09/28/22 1707 09/28/22 2047 09/29/22 0505  BP: 115/60 112/62 (!) 110/52 (!) 100/48  Pulse: 65 69 75 86  Resp: (!) 28 (!) 22 20 18   Temp: 97.9 F (36.6 C) 97.9 F (36.6 C) 98.3 F (36.8 C) 98.2 F (36.8 C)  TempSrc: (P) Other (Comment) Oral  Oral Oral  SpO2: 100% 97% 98% 98%  Weight:    95.7 kg  Height:        Intake/Output Summary (Last 24 hours) at 09/29/2022 1051 Last data filed at 09/28/2022 2219 Gross per 24 hour  Intake 1204 ml  Output 1275 ml  Net -71 ml    Filed Weights   09/27/22 0600 09/28/22 0242 09/29/22 0505  Weight: 100.2 kg 99.3 kg 95.7 kg    Examination:  General exam: Appears calm and comfortable  Respiratory system: Clear to auscultation. Respiratory effort normal. Cardiovascular system: S1 & S2 heard, RRR. No JVD, murmurs, rubs, gallops or clicks.  Trace pitting edema bilateral lower extremity. Gastrointestinal system: Abdomen is nondistended, soft and nontender. No organomegaly or masses felt. Normal bowel sounds heard. Central nervous system: Alert and oriented. No focal neurological deficits. Extremities: Symmetric 5 x 5 power. Skin: No rashes, lesions or ulcers.  Psychiatry: Judgement and insight appear normal. Mood & affect appropriate.     Data Reviewed: I have personally reviewed following labs and imaging studies  CBC: Recent Labs  Lab 09/26/22 0500 09/27/22 0450 09/27/22 1539 09/27/22 1546 09/27/22 1748 09/28/22 0121  09/28/22 1831 09/29/22 0954  WBC 8.6 8.3  --   --  7.6 8.1  --  8.6  NEUTROABS  --   --   --   --   --   --   --  6.3  HGB 8.0* 7.5*   < > 7.5*  7.5* 7.6* 7.4* 8.6* 8.5*  HCT 24.8* 23.8*   < > 22.0*  22.0* 23.4* 24.0* 27.1* 26.0*  MCV 117.5* 118.4*  --   --  116.4* 119.4*  --  112.6*  PLT 182 173  --   --  180 186  --  164   < > = values in this interval not displayed.    Basic Metabolic Panel: Recent Labs  Lab 09/24/22 1431 09/25/22 0442 09/26/22 0500 09/27/22 0450 09/27/22 1539 09/27/22 1546 09/27/22 1748 09/28/22 0121 09/29/22 0954  NA  --  135 135 136 138 138  137  --  137 137  K  --  3.6 4.0 3.9 4.0 4.2  4.2  --  4.7 4.1  CL  --  100 99 100  --   --   --  102 100  CO2  --  28 29 29   --   --   --  29 29  GLUCOSE  --  99 121* 99  --   --    --  117* 138*  BUN  --  13 14 14   --   --   --  12 11  CREATININE  --  0.89 0.94 0.92  --   --  0.93 0.98 1.07  CALCIUM  --  8.2* 8.5* 8.4*  --   --   --  8.9 8.9  MG 1.9 2.0  --  2.2  --   --   --   --  2.2    GFR: Estimated Creatinine Clearance: 63.9 mL/min (by C-G formula based on SCr of 1.07 mg/dL). Liver Function Tests: Recent Labs  Lab 09/24/22 1020 09/25/22 0442 09/26/22 0500 09/27/22 0450  AST 33 35 38 41  ALT 29 29 33 34  ALKPHOS 266* 233* 250* 244*  BILITOT 3.6* 3.8* 3.7* 3.3*  PROT 6.4* 6.0* 6.3* 6.2*  ALBUMIN 3.4* 3.1* 3.3* 3.2*    No results for input(s): "LIPASE", "AMYLASE" in the last 168 hours. No results for input(s): "AMMONIA" in the last 168 hours. Coagulation Profile: Recent Labs  Lab 09/27/22 0450  INR 1.6*    Cardiac Enzymes: No results for input(s): "CKTOTAL", "CKMB", "CKMBINDEX", "TROPONINI" in the last 168 hours. BNP (last 3 results) No results for input(s): "PROBNP" in the last 8760 hours. HbA1C: No results for input(s): "HGBA1C" in the last 72 hours. CBG: No results for input(s): "GLUCAP" in the last 168 hours. Lipid Profile: No results for input(s): "CHOL", "HDL", "LDLCALC", "TRIG", "CHOLHDL", "LDLDIRECT" in the last 72 hours. Thyroid Function Tests: No results for input(s): "TSH", "T4TOTAL", "FREET4", "T3FREE", "THYROIDAB" in the last 72 hours. Anemia Panel: No results for input(s): "VITAMINB12", "FOLATE", "FERRITIN", "TIBC", "IRON", "RETICCTPCT" in the last 72 hours. Sepsis Labs: No results for input(s): "PROCALCITON", "LATICACIDVEN" in the last 168 hours.  No results found for this or any previous visit (from the past 240 hour(s)).   Radiology Studies: CARDIAC CATHETERIZATION  Result Date: 09/27/2022   Ost LAD to Prox LAD lesion is 100% stenosed.   Ramus lesion is 100% stenosed.   Prox RCA to Mid RCA lesion is 60% stenosed.   Dist RCA lesion is 90% stenosed.   Origin  to Prox Graft lesion is 100% stenosed.   LIMA graft was  visualized by angiography and is large.   RIMA graft was visualized by non-selective angiography and is normal in caliber.   The graft exhibits no disease.   The graft exhibits no disease.   LV end diastolic pressure is mildly elevated.   Hemodynamic findings consistent with mild pulmonary hypertension.   There is moderate aortic valve stenosis. 3 vessel occlusive CAD. The native LCx is widely patent LIMA to the LAD is patent RIMA to the PDA is patent SVG to the ramus intermediate is occluded. This appears to be chronic Mild to Moderate aortic stenosis. AV mean gradient 18.5 mm Hg. AVA 2.5 cm squared with index 1.15. Elevated LV filling pressures. LVEDP 20 mm Hg. Mean PCWP 24 mm Hg with large V waves to 42 mm Hg Mild pulmonary HTN. PAP 56/17 with mean 31 mm Hg. Plan: medical management. Continue diuresis. Would benefit greatly from correction of severe anemia. Will need to monitor Aortic stenosis serially going forward.    Scheduled Meds:  apixaban  5 mg Oral BID   atorvastatin  80 mg Oral QPM   dapagliflozin propanediol  10 mg Oral Daily   diltiazem  240 mg Oral Daily   folic acid  1 mg Oral Daily   furosemide  80 mg Intravenous BID   metoprolol succinate  100 mg Oral Daily   pantoprazole (PROTONIX) IV  40 mg Intravenous Q12H   potassium chloride  30 mEq Oral Daily   sodium chloride flush  3 mL Intravenous Q12H   sodium chloride flush  3 mL Intravenous Q12H   spironolactone  12.5 mg Oral Daily   thiamine  100 mg Oral Daily   Continuous Infusions:  sodium chloride       LOS: 5 days   Darliss Cheney, MD [facility]  09/29/2022, 10:51 AM   *Please note that this is a verbal dictation therefore any spelling or grammatical errors are due to the "[employer]" system interpretation.  Please page via [place] and do not message via secure chat for urgent patient care matters. Secure chat can be used for non urgent patient care matters.  How to contact the [facility] Attending or Consulting  provider [place] or covering provider during after hours [place], for this patient?  Check the care team in [facility] and look for a) attending/consulting [facility] provider listed and b) the [facility] team listed. Page or secure chat [phone]. Log into [email or web address] and use [place]'s universal password to access. If you do not have the password, please contact the hospital operator. Locate the [facility] provider you are looking for under [facility] and page to a number that you can be directly reached. If you still have difficulty reaching the provider, please page the [facility] (Director on Call) for the Hospitalists listed on amion for assistance.

## 2022-09-29 NOTE — Progress Notes (Signed)
Rounding Note    Patient Name: Leonard Cisneros Date of Encounter: 09/29/2022  [place] Cardiologist: Rozann Lesches, MD   Subjective   No dyspnea or CP  Inpatient Medications    Scheduled Meds:  apixaban  5 mg Oral BID   atorvastatin  80 mg Oral QPM   dapagliflozin propanediol  10 mg Oral Daily   diltiazem  240 mg Oral Daily   folic acid  1 mg Oral Daily   furosemide  80 mg Intravenous BID   metoprolol succinate  100 mg Oral Daily   pantoprazole (PROTONIX) IV  40 mg Intravenous Q12H   potassium chloride  30 mEq Oral Daily   sodium chloride flush  3 mL Intravenous Q12H   sodium chloride flush  3 mL Intravenous Q12H   spironolactone  12.5 mg Oral Daily   thiamine  100 mg Oral Daily   Continuous Infusions:  sodium chloride     PRN Meds: sodium chloride, acetaminophen **OR** acetaminophen, bisacodyl, fentaNYL (SUBLIMAZE) injection, ipratropium-albuterol, nitroGLYCERIN, ondansetron **OR** ondansetron (ZOFRAN) IV, oxyCODONE, sodium chloride flush, traZODone   Vital Signs    Vitals:   09/28/22 1620 09/28/22 1707 09/28/22 2047 09/29/22 0505  BP: 115/60 112/62 (!) 110/52 (!) 100/48  Pulse: 65 69 75 86  Resp: (!) 28 (!) '22 20 18  '$ Temp: 97.9 F (36.6 C) 97.9 F (36.6 C) 98.3 F (36.8 C) 98.2 F (36.8 C)  TempSrc: (P) Other (Comment) Oral Oral Oral  SpO2: 100% 97% 98% 98%  Weight:    95.7 kg  Height:        Intake/Output Summary (Last 24 hours) at 09/29/2022 7017 Last data filed at 09/28/2022 2219 Gross per 24 hour  Intake 1204 ml  Output 1275 ml  Net -71 ml      09/29/2022    5:05 AM 09/28/2022    2:42 AM 09/27/2022    6:00 AM  Last 3 Weights  Weight (lbs) 211 lb 219 lb 220 lb 12.8 oz  Weight (kg) 95.709 kg 99.338 kg 100.154 kg      Telemetry    Atrial fibrillation rate controlled- Personally Reviewed   Physical Exam   GEN: No acute distress.   Neck: supple Cardiac: irregular, 2/6 systolic murmur Respiratory: Clear to auscultation  bilaterally. GI: Soft, nontender, non-distended  MS: 2-3+ edema Neuro:  Nonfocal  Psych: Normal affect   Labs    High Sensitivity Troponin:   Recent Labs  Lab 09/24/22 1020 09/24/22 1242  TROPONINIHS 18* 18*     Chemistry Recent Labs  Lab 09/24/22 1431 09/25/22 0442 09/26/22 0500 09/27/22 0450 09/27/22 1539 09/27/22 1546 09/27/22 1748 09/28/22 0121  NA  --  135 135 136 138 138  137  --  137  K  --  3.6 4.0 3.9 4.0 4.2  4.2  --  4.7  CL  --  100 99 100  --   --   --  102  CO2  --  '28 29 29  '$ --   --   --  29  GLUCOSE  --  99 121* 99  --   --   --  117*  BUN  --  '13 14 14  '$ --   --   --  12  CREATININE  --  0.89 0.94 0.92  --   --  0.93 0.98  CALCIUM  --  8.2* 8.5* 8.4*  --   --   --  8.9  MG 1.9 2.0  --  2.2  --   --   --   --   PROT  --  6.0* 6.3* 6.2*  --   --   --   --   ALBUMIN  --  3.1* 3.3* 3.2*  --   --   --   --   AST  --  35 38 41  --   --   --   --   ALT  --  29 33 34  --   --   --   --   ALKPHOS  --  233* 250* 244*  --   --   --   --   BILITOT  --  3.8* 3.7* 3.3*  --   --   --   --   GFRNONAA  --  >60 >60 >60  --   --  >60 >60  ANIONGAP  --  '7 7 7  '$ --   --   --  6     Hematology Recent Labs  Lab 09/27/22 0450 09/27/22 1539 09/27/22 1748 09/28/22 0121 09/28/22 1831  WBC 8.3  --  7.6 8.1  --   RBC 2.01*  --  2.01* 2.01*  --   HGB 7.5*   < > 7.6* 7.4* 8.6*  HCT 23.8*   < > 23.4* 24.0* 27.1*  MCV 118.4*  --  116.4* 119.4*  --   MCH 37.3*  --  37.8* 36.8*  --   MCHC 31.5  --  32.5 30.8  --   RDW 20.6*  --  20.7* 20.9*  --   PLT 173  --  180 186  --    < > = values in this interval not displayed.    BNP Recent Labs  Lab 09/24/22 1020 09/25/22 0442  BNP 288.0* 247.0*    DDimer No results for input(s): "DDIMER" in the last 168 hours.   Radiology    CARDIAC CATHETERIZATION  Result Date: 09/27/2022   Ost LAD to Prox LAD lesion is 100% stenosed.   Ramus lesion is 100% stenosed.   Prox RCA to Mid RCA lesion is 60% stenosed.   Dist RCA lesion  is 90% stenosed.   Origin to Prox Graft lesion is 100% stenosed.   LIMA graft was visualized by angiography and is large.   RIMA graft was visualized by non-selective angiography and is normal in caliber.   The graft exhibits no disease.   The graft exhibits no disease.   LV end diastolic pressure is mildly elevated.   Hemodynamic findings consistent with mild pulmonary hypertension.   There is moderate aortic valve stenosis. 3 vessel occlusive CAD. The native LCx is widely patent LIMA to the LAD is patent RIMA to the PDA is patent SVG to the ramus intermediate is occluded. This appears to be chronic Mild to Moderate aortic stenosis. AV mean gradient 18.5 mm Hg. AVA 2.5 cm squared with index 1.15. Elevated LV filling pressures. LVEDP 20 mm Hg. Mean PCWP 24 mm Hg with large V waves to 42 mm Hg Mild pulmonary HTN. PAP 56/17 with mean 31 mm Hg. Plan: medical management. Continue diuresis. Would benefit greatly from correction of severe anemia. Will need to monitor Aortic stenosis serially going forward.     Patient Profile     82 y.o. male with CAD s/p CABG (LIMA to LAD, RIMA to distal RCA, SVG to ramus intermediate vessel) x3 in 2003 with subsequent PCI to SVG to ramus intermediate graft in 2017, chronic HFpEF, permanent  atrial fibrillation/ flutter s/p remote flutter ablation on Eliquis, moderate to severe aortic stenosis, hypertension, hyperlipidemia, and chronic back pain who was admitted to [facility] on 09/24/2022 for acute on chronic diastolic CHF after presenting with progressive shortness of breath and lower extremity edema. He also started having recurrent episodes of chest pain during admission. He was diuresed and then transferred to [facility] on 09/27/2022 for right/left cardiac catheterization.  Echocardiogram this admission shows ejection fraction 55 to 60%, moderate left ventricular hypertrophy, severe left atrial enlargement, mild right atrial enlargement, mild mitral regurgitation,  moderate to severe aortic stenosis with mean gradient 24 mmHg.  Cardiac catheterization revealed three-vessel coronary artery disease with patent LIMA to the LAD, patent RIMA to the PDA, chronically occluded saphenous vein graft to the ramus, widely patent native left circumflex, mild to moderate aortic stenosis and elevated filling pressures.  Assessment & Plan    1 acute on chronic diastolic congestive heart failure-patient remains volume overloaded on examination.  Continue Lasix and spironolactone at present dose.  Continue Farxiga.  Follow renal function.  I's and O's not recorded.  Weight 95.7 kg.  2 coronary artery disease-catheterization results as outlined.  Plan medical therapy.  Continue statin.  No aspirin given need for anticoagulation.  3 permanent atrial fibrillation-continue Cardizem and Toprol at present dose for rate control.  Continue apixaban.  4 aortic stenosis-moderate.  Will need follow-up as an outpatient.  5 hypertension-continue present blood pressure medications.  6 hyperlipidemia-continue statin.  7 anemia-some improvement with transfusion.  Further evaluation per primary service.  For questions or updates, please contact [place] Please consult [email or web address] for contact info under        Signed, Kirk Ruths, MD  09/29/2022, 9:22 AM

## 2022-09-30 DIAGNOSIS — I5031 Acute diastolic (congestive) heart failure: Secondary | ICD-10-CM | POA: Diagnosis not present

## 2022-09-30 DIAGNOSIS — I5033 Acute on chronic diastolic (congestive) heart failure: Secondary | ICD-10-CM | POA: Diagnosis not present

## 2022-09-30 LAB — BASIC METABOLIC PANEL
Anion gap: 5 (ref 5–15)
BUN: 11 mg/dL (ref 8–23)
CO2: 31 mmol/L (ref 22–32)
Calcium: 8.6 mg/dL — ABNORMAL LOW (ref 8.9–10.3)
Chloride: 100 mmol/L (ref 98–111)
Creatinine, Ser: 1.09 mg/dL (ref 0.61–1.24)
GFR, Estimated: >60 mL/min (ref >60)
Glucose, Bld: 108 mg/dL — ABNORMAL HIGH (ref 70–99)
Potassium: 3.7 mmol/L (ref 3.5–5.1)
Sodium: 136 mmol/L (ref 135–145)

## 2022-09-30 NOTE — Progress Notes (Signed)
Rounding Note    Patient Name: Leonard Cisneros Date of Encounter: 09/30/2022  [place] Cardiologist: Rozann Lesches, MD   Subjective   Pt denies CP or dyspnea  Inpatient Medications    Scheduled Meds:  apixaban  5 mg Oral BID   atorvastatin  80 mg Oral QPM   dapagliflozin propanediol  10 mg Oral Daily   diltiazem  240 mg Oral Daily   folic acid  1 mg Oral Daily   furosemide  80 mg Intravenous BID   metoprolol succinate  100 mg Oral Daily   pantoprazole (PROTONIX) IV  40 mg Intravenous Q12H   potassium chloride  30 mEq Oral Daily   sodium chloride flush  3 mL Intravenous Q12H   sodium chloride flush  3 mL Intravenous Q12H   spironolactone  12.5 mg Oral Daily   thiamine  100 mg Oral Daily   Continuous Infusions:  sodium chloride     PRN Meds: sodium chloride, acetaminophen **OR** acetaminophen, bisacodyl, fentaNYL (SUBLIMAZE) injection, ipratropium-albuterol, nitroGLYCERIN, ondansetron **OR** ondansetron (ZOFRAN) IV, oxyCODONE, sodium chloride flush, traZODone   Vital Signs    Vitals:   09/29/22 2108 09/30/22 0057 09/30/22 0400 09/30/22 0946  BP: (!) 96/45  (!) 96/53 110/62  Pulse: 73  74   Resp: 18  20   Temp: 98.2 F (36.8 C)  97.9 F (36.6 C)   TempSrc: Oral  Oral   SpO2: 98%  96%   Weight:  96.7 kg    Height:        Intake/Output Summary (Last 24 hours) at 09/30/2022 1011 Last data filed at 09/30/2022 0820 Gross per 24 hour  Intake 600 ml  Output 900 ml  Net -300 ml       09/30/2022   12:57 AM 09/29/2022    5:05 AM 09/28/2022    2:42 AM  Last 3 Weights  Weight (lbs) 213 lb 3.2 oz 211 lb 219 lb  Weight (kg) 96.707 kg 95.709 kg 99.338 kg      Telemetry    Atrial fibrillation rate controlled- Personally Reviewed   Physical Exam   GEN: NAD Neck: supple, no adenopathy Cardiac: irregular, 2/6 systolic murmur, no rub Respiratory: CTA GI: Soft, NT/ND MS: 2-3+ edema Neuro:  Grossly intact Psych: Normal affect   Labs    High  Sensitivity Troponin:   Recent Labs  Lab 09/24/22 1020 09/24/22 1242  TROPONINIHS 18* 18*      Chemistry Recent Labs  Lab 09/25/22 0442 09/26/22 0500 09/27/22 0450 09/27/22 1539 09/28/22 0121 09/29/22 0954 09/30/22 0040  NA 135 135 136   < > 137 137 136  K 3.6 4.0 3.9   < > 4.7 4.1 3.7  CL 100 99 100  --  102 100 100  CO2 '28 29 29  '$ --  '29 29 31  '$ GLUCOSE 99 121* 99  --  117* 138* 108*  BUN '13 14 14  '$ --  '12 11 11  '$ CREATININE 0.89 0.94 0.92   < > 0.98 1.07 1.09  CALCIUM 8.2* 8.5* 8.4*  --  8.9 8.9 8.6*  MG 2.0  --  2.2  --   --  2.2  --   PROT 6.0* 6.3* 6.2*  --   --   --   --   ALBUMIN 3.1* 3.3* 3.2*  --   --   --   --   AST 35 38 41  --   --   --   --  ALT 29 33 34  --   --   --   --   ALKPHOS 233* 250* 244*  --   --   --   --   BILITOT 3.8* 3.7* 3.3*  --   --   --   --   GFRNONAA >60 >60 >60   < > >60 >60 >60  ANIONGAP '7 7 7  '$ --  '6 8 5   '$ < > = values in this interval not displayed.      Hematology Recent Labs  Lab 09/27/22 1748 09/28/22 0121 09/28/22 1831 09/29/22 0954  WBC 7.6 8.1  --  8.6  RBC 2.01* 2.01*  --  2.31*  HGB 7.6* 7.4* 8.6* 8.5*  HCT 23.4* 24.0* 27.1* 26.0*  MCV 116.4* 119.4*  --  112.6*  MCH 37.8* 36.8*  --  36.8*  MCHC 32.5 30.8  --  32.7  RDW 20.7* 20.9*  --  23.6*  PLT 180 186  --  164     BNP Recent Labs  Lab 09/24/22 1020 09/25/22 0442  BNP 288.0* 247.0*      Patient Profile     82 y.o. male with CAD s/p CABG (LIMA to LAD, RIMA to distal RCA, SVG to ramus intermediate vessel) x3 in 2003 with subsequent PCI to SVG to ramus intermediate graft in 2017, chronic HFpEF, permanent atrial fibrillation/ flutter s/p remote flutter ablation on Eliquis, moderate to severe aortic stenosis, hypertension, hyperlipidemia, and chronic back pain who was admitted to [facility] on 09/24/2022 for acute on chronic diastolic CHF after presenting with progressive shortness of breath and lower extremity edema. He also started having recurrent  episodes of chest pain during admission. He was diuresed and then transferred to [facility] on 09/27/2022 for right/left cardiac catheterization.  Echocardiogram this admission shows ejection fraction 55 to 60%, moderate left ventricular hypertrophy, severe left atrial enlargement, mild right atrial enlargement, mild mitral regurgitation, moderate to severe aortic stenosis with mean gradient 24 mmHg.  Cardiac catheterization revealed three-vessel coronary artery disease with patent LIMA to the LAD, patent RIMA to the PDA, chronically occluded saphenous vein graft to the ramus, widely patent native left circumflex, mild to moderate aortic stenosis and elevated filling pressures.  Assessment & Plan    1 acute on chronic diastolic congestive heart failure-I and O's not accurate.  Still with some excess volume.  Will continue Lasix, spironolactone and Farxiga at present dose.  Follow renal function.  Likely can transition to oral diuretic tomorrow and discharge if patient stable.    2 coronary artery disease-catheterization results as outlined.  Plan medical therapy.  Continue statin.  No aspirin given need for anticoagulation.  3 permanent atrial fibrillation-continue Cardizem and Toprol at present dose for rate control.  Continue apixaban.  4 aortic stenosis-moderate.  Will need follow-up as an outpatient.  5 hypertension-continue present blood pressure medications.  6 hyperlipidemia-continue statin.  7 anemia-some improvement with transfusion.  Further evaluation per primary service.  For questions or updates, please contact [place] Please consult [email or web address] for contact info under        Signed, Kirk Ruths, MD  09/30/2022, 10:11 AM

## 2022-09-30 NOTE — Progress Notes (Signed)
OT Cancellation Note  Patient Details Name: Leonard Cisneros MRN: [id number] DOB: 09-24-1940   Cancelled Treatment:    Reason Eval/Treat Not Completed: OT screened, no needs identified, will sign off (per discussion with PT, pt has assist at home for LB ADLs, is at baseline. OT will screen, please reconsult if there is a change in pt status.)  Leonard Cisneros, Leonard Cisneros, Leonard Cisneros SecureChat Preferred Acute Rehab [phone]   Leonard Cisneros 09/30/2022, 12:47 PM

## 2022-09-30 NOTE — Progress Notes (Signed)
PROGRESS NOTE    Leonard Cisneros  ZMO:[id number] DOB: [birth year] DOA: 09/24/2022 PCP: Alvira Monday, FNP   Brief Narrative:  82 year old gentleman with history of a flutter, ischemic cardiomyopathy status post CABG x 2, coronary artery disease, HFpEF, hypertension, hyperlipidemia, moderate aortic stenosis, permanent atrial fibrillation on apixaban presented with shortness of breath and increasing edema in the legs.  He has been taking Demadex and increasing the doses at home but it has not been able to control his symptoms although he has had an increase in urination since increasing the doses.  He reports having a cough, diminished appetite, weight gain and intermittent chest pain episodes.   He was evaluated in the emergency department and noted to have a hemoglobin down to 8.1, potassium down to 2.9, bilirubin elevated at 3.6, sodium down to 131, BNP 288, troponin 18, 18. Chest xray with findings of pulmonary edema.  He was started on IV lasix and admission requested for further diuresis and cardiology consultation.   Patient slowly improved clinically with IV lasix, but continued to have intermittent chest discomfort.  As a result, cardiology requested transfer to [facility] for right and left heart cath.  Further details below.  Assessment & Plan:   Principal Problem:   Acute heart failure (HCC) Active Problems:   Hx of CABG   Acute on chronic heart failure with preserved ejection fraction (HCC)   Severe pulmonary hypertension (HCC)   Essential hypertension   PAD (peripheral artery disease) (HCC)   ETOH abuse   High risk medication use   Impaired glucose metabolism   Obesity (BMI 30-39.9)   Neuropathic pain   Exertional angina   Hyperlipidemia LDL goal <70   CAD S/P percutaneous coronary angioplasty   Ischemic cardiomyopathy   Physical deconditioning   Gait instability   Persistent atrial fibrillation (HCC)   Moderate aortic stenosis   Heart failure (HCC)   Hypokalemia    Hyponatremia   Elevated liver enzymes   Hyperbilirubinemia   Volume overload   Acquired thrombophilia (HCC)   Microcytic anemia   Acute respiratory failure with hypoxia (HCC)   Nonrheumatic aortic valve stenosis   Unstable angina (HCC)  Acute hypoxic respiratory failure secondary to acute on chronic HFpEF/moderate to severe aortic stenosis: pt failed outpatient management with increasing torsemide doses at home. . Echo showed LVEF of 55-60% with no regional wall motion abnormalities and moderate LVH, normal RV function, severely dilated left atrium, severe MAC with mild MR, and moderate to severe aortic stenosis with mean gradient of 24 mmHg and dimensionless index of 0.34. R/LHC on 1/25 showed chronic occlusion of SVG to ramus intermediate within prior stent but patent LIMA to LAD and RIMA to PDA. Also showed mild to moderate aortic stenosis with mean gradient of 18.5 mmHg and AVA of 2.5 cm^2 with index of 1.15. LV filling pressures were elevated - LVEDP 20 mmHg and mean PCWP 24 mmHG with large V waves to 42 mmHg.  He is on room air saturating 96% but still has bilateral +1 lower extremity edema and clinically appears fluid overloaded. Cardiology on board and managing this.  He is on Toprol-XL.  They have increased his Lasix to 80 mg IV twice daily and added Aldactone 12.5 mg and Farxiga 10 mg p.o. daily on 09/28/2022.  Chronic/permanent Atrial Fibrillation: Rate controlled, continue Cardizem CD 240 mg p.o. daily and Toprol-XL 100 mg p.o. daily.  Continue Eliquis.  Blood pressure on the low normal side.  Will defer to cardiology for managing  his medications.  Hyperlipidemia: Continue atorvastatin.   Hyperbilirubinemia: Acute on chronic, around 3.3 but has remained stable over the last several days. 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   Etoh dependence -drinks 2 beers daily -previous liquor -no signs of withdrawal during  hospitalization Continue thiamine.   Hypokalemia: Resolved.  Acute on chronic macrocytic anemia - follow up stool guaiac testing --ordered - B12 553 - folate 8.6 - iron saturation 56%, ferritin 310 - indices consistent with AOCD in setting of chronic Etoh use.  His baseline hemoglobin appears to be around 10, his hemoglobin here was 7.4 and he was symptomatic symptomatic and due to severe CAD, we gave him 1 unit of PRBC transfusion, hemoglobin 8.5 yesterday and his symptoms have improved.  He is on PPI empirically.   Hypertension -Blood pressure remains on the low normal side, continue metoprolol succinate and diltiazem CD, defer to cardiology for further modifications.   Acquired thrombophilia - continue apixaban for now   Obesity -BMI 30.99 -lifestyle modification and dietary modification counseled.  DVT prophylaxis: Place TED hose Start: 09/24/22 1429 Eliquis   Code Status: Full Code  Family Communication:  None present at bedside.  Plan of care discussed with patient in length and he/she verbalized understanding and agreed with it.  Status is: Inpatient Remains inpatient appropriate because: Needs further IV diuresis.   Estimated body mass index is 29.74 kg/m as calculated from the following:   Height as of this encounter: 5' 11"  (1.803 m).   Weight as of this encounter: 96.7 kg.    Nutritional Assessment: Body mass index is 29.74 kg/m.[name] Seen by dietician.  I agree with the assessment and plan as outlined below: Nutrition Status:        . Skin Assessment: I have examined the patient's skin and I agree with the wound assessment as performed by the wound care RN as outlined below:    Consultants:  Cardiology  Procedures:  None  Antimicrobials:  Anti-infectives (From admission, onward)    None         Subjective:  Patient seen and examined.  He has no complaints.  He is very eager to go home.  Objective: Vitals:   09/29/22 0505 09/29/22 2108  09/30/22 0057 09/30/22 0400  BP: (!) 100/48 (!) 96/45  (!) 96/53  Pulse: 86 73  74  Resp: 18 18  20   Temp: 98.2 F (36.8 C) 98.2 F (36.8 C)  97.9 F (36.6 C)  TempSrc: Oral Oral  Oral  SpO2: 98% 98%  96%  Weight: 95.7 kg  96.7 kg   Height:        Intake/Output Summary (Last 24 hours) at 09/30/2022 0923 Last data filed at 09/30/2022 0820 Gross per 24 hour  Intake 600 ml  Output 900 ml  Net -300 ml    Filed Weights   09/28/22 0242 09/29/22 0505 09/30/22 0057  Weight: 99.3 kg 95.7 kg 96.7 kg    Examination:  General exam: Appears calm and comfortable  Respiratory system: Clear to auscultation. Respiratory effort normal. Cardiovascular system: S1 & S2 heard, RRR. No JVD, murmurs, rubs, gallops or clicks.  +1 pitting edema bilateral lower extremity. Gastrointestinal system: Abdomen is nondistended, soft and nontender. No organomegaly or masses felt. Normal bowel sounds heard. Central nervous system: Alert and oriented. No focal neurological deficits. Extremities: Symmetric 5 x 5 power. Skin: No rashes, lesions or ulcers.  Psychiatry: Judgement and insight appear  normal. Mood & affect appropriate.    Data Reviewed: I have personally reviewed following labs and imaging studies  CBC: Recent Labs  Lab 09/26/22 0500 09/27/22 0450 09/27/22 1539 09/27/22 1546 09/27/22 1748 09/28/22 0121 09/28/22 1831 09/29/22 0954  WBC 8.6 8.3  --   --  7.6 8.1  --  8.6  NEUTROABS  --   --   --   --   --   --   --  6.3  HGB 8.0* 7.5*   < > 7.5*  7.5* 7.6* 7.4* 8.6* 8.5*  HCT 24.8* 23.8*   < > 22.0*  22.0* 23.4* 24.0* 27.1* 26.0*  MCV 117.5* 118.4*  --   --  116.4* 119.4*  --  112.6*  PLT 182 173  --   --  180 186  --  164   < > = values in this interval not displayed.    Basic Metabolic Panel: Recent Labs  Lab 09/24/22 1431 09/25/22 0442 09/26/22 0500 09/27/22 0450 09/27/22 1539 09/27/22 1546 09/27/22 1748 09/28/22 0121 09/29/22 0954 09/30/22 0040  NA  --  135 135 136  138 138  137  --  137 137 136  K  --  3.6 4.0 3.9 4.0 4.2  4.2  --  4.7 4.1 3.7  CL  --  100 99 100  --   --   --  102 100 100  CO2  --  28 29 29   --   --   --  29 29 31   GLUCOSE  --  99 121* 99  --   --   --  117* 138* 108*  BUN  --  13 14 14   --   --   --  12 11 11   CREATININE  --  0.89 0.94 0.92  --   --  0.93 0.98 1.07 1.09  CALCIUM  --  8.2* 8.5* 8.4*  --   --   --  8.9 8.9 8.6*  MG 1.9 2.0  --  2.2  --   --   --   --  2.2  --     GFR: Estimated Creatinine Clearance: 63.1 mL/min (by C-G formula based on SCr of 1.09 mg/dL). Liver Function Tests: Recent Labs  Lab 09/24/22 1020 09/25/22 0442 09/26/22 0500 09/27/22 0450  AST 33 35 38 41  ALT 29 29 33 34  ALKPHOS 266* 233* 250* 244*  BILITOT 3.6* 3.8* 3.7* 3.3*  PROT 6.4* 6.0* 6.3* 6.2*  ALBUMIN 3.4* 3.1* 3.3* 3.2*    No results for input(s): "LIPASE", "AMYLASE" in the last 168 hours. No results for input(s): "AMMONIA" in the last 168 hours. Coagulation Profile: Recent Labs  Lab 09/27/22 0450  INR 1.6*    Cardiac Enzymes: No results for input(s): "CKTOTAL", "CKMB", "CKMBINDEX", "TROPONINI" in the last 168 hours. BNP (last 3 results) No results for input(s): "PROBNP" in the last 8760 hours. HbA1C: No results for input(s): "HGBA1C" in the last 72 hours. CBG: No results for input(s): "GLUCAP" in the last 168 hours. Lipid Profile: No results for input(s): "CHOL", "HDL", "LDLCALC", "TRIG", "CHOLHDL", "LDLDIRECT" in the last 72 hours. Thyroid Function Tests: No results for input(s): "TSH", "T4TOTAL", "FREET4", "T3FREE", "THYROIDAB" in the last 72 hours. Anemia Panel: No results for input(s): "VITAMINB12", "FOLATE", "FERRITIN", "TIBC", "IRON", "RETICCTPCT" in the last 72 hours. Sepsis Labs: No results for input(s): "PROCALCITON", "LATICACIDVEN" in the last 168 hours.  No results found for this or any previous visit (from the past 240 hour(s)).  Radiology Studies: No results found.  Scheduled Meds:  apixaban  5  mg Oral BID   atorvastatin  80 mg Oral QPM   dapagliflozin propanediol  10 mg Oral Daily   diltiazem  240 mg Oral Daily   folic acid  1 mg Oral Daily   furosemide  80 mg Intravenous BID   metoprolol succinate  100 mg Oral Daily   pantoprazole (PROTONIX) IV  40 mg Intravenous Q12H   potassium chloride  30 mEq Oral Daily   sodium chloride flush  3 mL Intravenous Q12H   sodium chloride flush  3 mL Intravenous Q12H   spironolactone  12.5 mg Oral Daily   thiamine  100 mg Oral Daily   Continuous Infusions:  sodium chloride       LOS: 6 days   Darliss Cheney, MD [facility]  09/30/2022, 9:23 AM   *Please note that this is a verbal dictation therefore any spelling or grammatical errors are due to the "[employer]" system interpretation.  Please page via [place] and do not message via secure chat for urgent patient care matters. Secure chat can be used for non urgent patient care matters.  How to contact the [facility] Attending or Consulting provider [place] or covering provider during after hours [place], for this patient?  Check the care team in [facility] and look for a) attending/consulting TRH provider listed and b) the [facility] team listed. Page or secure chat [phone]. Log into [email or web address] and use [place]'s universal password to access. If you do not have the password, please contact the hospital operator. Locate the [facility] provider you are looking for under [facility] and page to a number that you can be directly reached. If you still have difficulty reaching the provider, please page the [facility] (Director on Call) for the Hospitalists listed on amion for assistance.

## 2022-09-30 NOTE — Evaluation (Signed)
Physical Therapy Evaluation Patient Details Name: Leonard Cisneros MRN: [id number] DOB: Feb 25, 1941 Today's Date: 09/30/2022  History of Present Illness  Leonard Cisneros is a 82 year old male with report that for the past 12 days he has had shortness of breath and increasing edema in the legs. Pt found to have aortic stenosis and underwent R/L heart cath/coronoary/graft angiography PMH: flutter, ischemic cardiomyopathy status post CABG x 2, coronary artery disease, HFpEF, hypertension, hyperlipidemia, moderate aortic stenosis, permanent atrial fibrillation   Clinical Impression  Pt admitted with above. Pt functioning near baseline. Pt ambulates with bilat crutches and demos safe sequencing. SpO2 at 86-88% on RA with DOE of 2/4 during ambulation of 150". Pt reports this level of DOE to be his normal. Pt reports his wife has to help him with his socks and shoes but other than that he is independent. Acute PT to cont to follow patient to progress ambulation and activity tolerance as pt has had prolonged hospital stay. Recommend mobility specialists to work with patient as well.        Recommendations for follow up therapy are one component of a multi-disciplinary discharge planning process, led by the attending physician.  Recommendations may be updated based on patient status, additional functional criteria and insurance authorization.  Follow Up Recommendations No PT follow up      Assistance Recommended at Discharge Intermittent Supervision/Assistance  Patient can return home with the following       Equipment Recommendations None recommended by PT  Recommendations for Other Services       Functional Status Assessment Patient has had a recent decline in their functional status and demonstrates the ability to make significant improvements in function in a reasonable and predictable amount of time.     Precautions / Restrictions Precautions Precautions: Other (comment) Precaution  Comments: watch SpO2, bilat LE edema Restrictions Weight Bearing Restrictions: No      Mobility  Bed Mobility               General bed mobility comments: pt received up in chair    Transfers Overall transfer level: Needs assistance Equipment used: None Transfers: Sit to/from Stand Sit to Stand: Min guard           General transfer comment: pushed up from arm rests, wide base of support but stedy    Ambulation/Gait Ambulation/Gait assistance: Min guard Gait Distance (Feet): 150 Feet Assistive device: Crutches Gait Pattern/deviations: Step-through pattern, Trunk flexed Gait velocity: age appropriate Gait velocity interpretation: 1.31 - 2.62 ft/sec, indicative of limited community ambulator   General Gait Details: pt with good sequencing of crutches, fluid gait pattern, SpO2 at 86-88% on RA, pt reports 2/4 DOE with ambulation and this to be normal  Financial trader Rankin (Stroke Patients Only)       Balance Overall balance assessment: Mild deficits observed, not formally tested (pt stood without UE support or external support to tie pants)                                           Pertinent Vitals/Pain Pain Assessment Pain Assessment: No/denies pain    Home Living Family/patient expects to be discharged to:: Private residence Living Arrangements: Spouse/significant other Available Help at Discharge: Family;Available 24 hours/day Type of Home: House Home Access: Stairs to enter  Entrance Stairs-Rails: Right Entrance Stairs-Number of Steps: 1 (or 14 in back)   Home Layout: One level Home Equipment: Cane - single Barista (2 wheels);Crutches      Prior Function Prior Level of Function : Independent/Modified Independent             Mobility Comments: uses 2 canes vs crutches, denies falls ADLs Comments: indep except spouse dons sock/shoes, pt loves to cook and does 90% of the  cooking     Hand Dominance   Dominant Hand: Right    Extremity/Trunk Assessment   Upper Extremity Assessment Upper Extremity Assessment: Overall WFL for tasks assessed    Lower Extremity Assessment Lower Extremity Assessment: Generalized weakness (with noted edema, pt with R drop foot (not new))    Cervical / Trunk Assessment Cervical / Trunk Assessment: Kyphotic  Communication   Communication: HOH  Cognition Arousal/Alertness: Awake/alert Behavior During Therapy: WFL for tasks assessed/performed Overall Cognitive Status: Within Functional Limits for tasks assessed                                          General Comments General comments (skin integrity, edema, etc.): bilat LE edema, SpO2 86-88% on RA, returned to 90s within 1 minute of returning to sitting    Exercises     Assessment/Plan    PT Assessment Patient needs continued PT services  PT Problem List Decreased activity tolerance;Decreased balance;Decreased strength       PT Treatment Interventions DME instruction;Gait training;Stair training;Functional mobility training;Therapeutic activities;Therapeutic exercise;Balance training    PT Goals (Current goals can be found in the Care Plan section)  Acute Rehab PT Goals Patient Stated Goal: home ASAP PT Goal Formulation: With patient Time For Goal Achievement: 10/14/22 Potential to Achieve Goals: Good    Frequency Min 3X/week     Co-evaluation               AM-PAC PT "6 Clicks" Mobility  Outcome Measure Help needed turning from your back to your side while in a flat bed without using bedrails?: None Help needed moving from lying on your back to sitting on the side of a flat bed without using bedrails?: None Help needed moving to and from a bed to a chair (including a wheelchair)?: None Help needed standing up from a chair using your arms (e.g., wheelchair or bedside chair)?: None Help needed to walk in hospital room?: A  Little Help needed climbing 3-5 steps with a railing? : A Little 6 Click Score: 22    End of Session Equipment Utilized During Treatment: Gait belt Activity Tolerance: Patient tolerated treatment well Patient left: in chair;with call bell/phone within reach Nurse Communication: Mobility status (Spo2 saturation on RA) PT Visit Diagnosis: Unsteadiness on feet (R26.81)    Time: 8938-1017 PT Time Calculation (min) (ACUTE ONLY): 30 min   Charges:   PT Evaluation $PT Eval Moderate Complexity: 1 Mod PT Treatments $Gait Training: 8-22 mins        Kittie Plater, PT, DPT Acute Rehabilitation Services Secure chat preferred Office #: [phone]   Berline Lopes 09/30/2022, 12:35 PM

## 2022-09-30 NOTE — Plan of Care (Signed)
  Problem: Activity: Goal: Capacity to carry out activities will improve Outcome: Progressing   Problem: Cardiac: Goal: Ability to achieve and maintain adequate cardiopulmonary perfusion will improve Outcome: Progressing   Problem: Activity: Goal: Risk for activity intolerance will decrease Outcome: Progressing   Problem: Elimination: Goal: Will not experience complications related to urinary retention Outcome: Progressing

## 2022-09-30 NOTE — Plan of Care (Signed)
  Problem: Education: Goal: Ability to demonstrate management of disease process will improve Outcome: Progressing Goal: Ability to verbalize understanding of medication therapies will improve Outcome: Progressing   Problem: Activity: Goal: Capacity to carry out activities will improve Outcome: Progressing   Problem: Cardiac: Goal: Ability to achieve and maintain adequate cardiopulmonary perfusion will improve Outcome: Progressing   Problem: Education: Goal: Knowledge of General Education information will improve Description: Including pain rating scale, medication(s)/side effects and non-pharmacologic comfort measures Outcome: Progressing   Problem: Health Behavior/Discharge Planning: Goal: Ability to manage health-related needs will improve Outcome: Progressing   Problem: Clinical Measurements: Goal: Ability to maintain clinical measurements within normal limits will improve Outcome: Progressing Goal: Will remain free from infection Outcome: Progressing Goal: Diagnostic test results will improve Outcome: Progressing Goal: Respiratory complications will improve Outcome: Progressing   Problem: Safety: Goal: Ability to remain free from injury will improve Outcome: Progressing   Problem: Skin Integrity: Goal: Risk for impaired skin integrity will decrease Outcome: Progressing   Problem: Education: Goal: Knowledge of General Education information will improve Description: Including pain rating scale, medication(s)/side effects and non-pharmacologic comfort measures Outcome: Progressing   Problem: Health Behavior/Discharge Planning: Goal: Ability to manage health-related needs will improve Outcome: Progressing   Problem: Clinical Measurements: Goal: Ability to maintain clinical measurements within normal limits will improve Outcome: Progressing   Problem: Education: Goal: Individualized Educational Video(s) Outcome: Progressing   Problem: Activity: Goal: Ability  to return to baseline activity level will improve Outcome: Progressing   Problem: Cardiovascular: Goal: Vascular access site(s) Level 0-1 will be maintained Outcome: Progressing   Problem: Health Behavior/Discharge Planning: Goal: Ability to safely manage health-related needs after discharge will improve Outcome: Progressing

## 2022-10-01 ENCOUNTER — Other Ambulatory Visit (HOSPITAL_COMMUNITY): Payer: Medicare HMO

## 2022-10-01 ENCOUNTER — Encounter (HOSPITAL_COMMUNITY): Payer: Medicare HMO

## 2022-10-01 ENCOUNTER — Other Ambulatory Visit (HOSPITAL_COMMUNITY): Payer: Self-pay

## 2022-10-01 DIAGNOSIS — I5020 Unspecified systolic (congestive) heart failure: Secondary | ICD-10-CM

## 2022-10-01 DIAGNOSIS — I5031 Acute diastolic (congestive) heart failure: Secondary | ICD-10-CM | POA: Diagnosis not present

## 2022-10-01 DIAGNOSIS — I4821 Permanent atrial fibrillation: Secondary | ICD-10-CM

## 2022-10-01 MED ORDER — TORSEMIDE 20 MG PO TABS
80.0000 mg | ORAL_TABLET | Freq: Every day | ORAL | 0 refills | Status: DC
  Filled 2022-10-01: qty 120, 30d supply, fill #0

## 2022-10-01 MED ORDER — DAPAGLIFLOZIN PROPANEDIOL 10 MG PO TABS
10.0000 mg | ORAL_TABLET | Freq: Every day | ORAL | 0 refills | Status: AC
  Filled 2022-10-01: qty 30, 30d supply, fill #0

## 2022-10-01 MED ORDER — POTASSIUM CHLORIDE CRYS ER 10 MEQ PO TBCR
30.0000 meq | EXTENDED_RELEASE_TABLET | Freq: Every day | ORAL | 0 refills | Status: DC
  Filled 2022-10-01: qty 90, 30d supply, fill #0

## 2022-10-01 MED ORDER — SPIRONOLACTONE 25 MG PO TABS
12.5000 mg | ORAL_TABLET | Freq: Every day | ORAL | 0 refills | Status: DC
  Filled 2022-10-01: qty 15, 30d supply, fill #0

## 2022-10-01 MED ORDER — TORSEMIDE 20 MG PO TABS
80.0000 mg | ORAL_TABLET | Freq: Every day | ORAL | Status: DC
  Administered 2022-10-01: 80 mg via ORAL
  Filled 2022-10-01: qty 4

## 2022-10-01 MED ORDER — THIAMINE HCL 100 MG PO TABS
100.0000 mg | ORAL_TABLET | Freq: Every day | ORAL | 0 refills | Status: AC
  Filled 2022-10-01: qty 30, 30d supply, fill #0

## 2022-10-01 NOTE — Progress Notes (Signed)
   10/01/22 1200  Mobility  Activity Ambulated with assistance in hallway  Level of Assistance Standby assist, set-up cues, supervision of patient - no hands on  Assistive Device Crutches  Distance Ambulated (ft) 200 ft  Activity Response Tolerated well  Mobility Referral Yes  $Mobility charge 1 Mobility   Mobility Specialist Progress Note  Received pt in chair having no complaints and agreeable to mobility. Pt was asymptomatic throughout ambulation and returned to room w/o fault. Left in chair w/ call bell in reach and all needs met.  Lucious Groves Mobility Specialist  Please contact via SecureChat or Rehab office at [phone]

## 2022-10-01 NOTE — Progress Notes (Signed)
Rounding Note    Patient Name: Leonard Cisneros Date of Encounter: 10/01/2022  [place] Cardiologist: Rozann Lesches, MD   Subjective   No issues overnight. Net negative 351 cc.   Inpatient Medications    Scheduled Meds:  apixaban  5 mg Oral BID   atorvastatin  80 mg Oral QPM   dapagliflozin propanediol  10 mg Oral Daily   diltiazem  240 mg Oral Daily   folic acid  1 mg Oral Daily   furosemide  80 mg Intravenous BID   metoprolol succinate  100 mg Oral Daily   pantoprazole (PROTONIX) IV  40 mg Intravenous Q12H   potassium chloride  30 mEq Oral Daily   sodium chloride flush  3 mL Intravenous Q12H   sodium chloride flush  3 mL Intravenous Q12H   spironolactone  12.5 mg Oral Daily   thiamine  100 mg Oral Daily   Continuous Infusions:  sodium chloride     PRN Meds: sodium chloride, acetaminophen **OR** acetaminophen, bisacodyl, fentaNYL (SUBLIMAZE) injection, ipratropium-albuterol, nitroGLYCERIN, ondansetron **OR** ondansetron (ZOFRAN) IV, oxyCODONE, sodium chloride flush, traZODone   Vital Signs    Vitals:   09/30/22 1104 09/30/22 1718 09/30/22 2011 10/01/22 0538  BP: 105/65 (!) 109/50 (!) 103/53 (!) 110/47  Pulse: 77  63 74  Resp: 20     Temp: 98.2 F (36.8 C)  98.2 F (36.8 C) 97.6 F (36.4 C)  TempSrc: Oral  Oral Oral  SpO2: 95%  93% 94%  Weight:    94.8 kg  Height:        Intake/Output Summary (Last 24 hours) at 10/01/2022 0849 Last data filed at 10/01/2022 0802 Gross per 24 hour  Intake 1000 ml  Output 1001 ml  Net -1 ml      10/01/2022    5:38 AM 09/30/2022   12:57 AM 09/29/2022    5:05 AM  Last 3 Weights  Weight (lbs) 208 lb 15.9 oz 213 lb 3.2 oz 211 lb  Weight (kg) 94.8 kg 96.707 kg 95.709 kg      Telemetry    Atrial fibrillation rate controlled- Personally Reviewed   Physical Exam   GEN: NAD Neck: supple, no adenopathy Cardiac: irregular, 2/6 systolic murmur, no rub Respiratory: CTA GI: Soft, NT/ND MS: 2-3+  edema Neuro:  Grossly intact Psych: Normal affect   Labs    High Sensitivity Troponin:   Recent Labs  Lab 09/24/22 1020 09/24/22 1242  TROPONINIHS 18* 18*     Chemistry Recent Labs  Lab 09/25/22 0442 09/26/22 0500 09/27/22 0450 09/27/22 1539 09/28/22 0121 09/29/22 0954 09/30/22 0040  NA 135 135 136   < > 137 137 136  K 3.6 4.0 3.9   < > 4.7 4.1 3.7  CL 100 99 100  --  102 100 100  CO2 '28 29 29  '$ --  '29 29 31  '$ GLUCOSE 99 121* 99  --  117* 138* 108*  BUN '13 14 14  '$ --  '12 11 11  '$ CREATININE 0.89 0.94 0.92   < > 0.98 1.07 1.09  CALCIUM 8.2* 8.5* 8.4*  --  8.9 8.9 8.6*  MG 2.0  --  2.2  --   --  2.2  --   PROT 6.0* 6.3* 6.2*  --   --   --   --   ALBUMIN 3.1* 3.3* 3.2*  --   --   --   --   AST 35 38 41  --   --   --   --  ALT 29 33 34  --   --   --   --   ALKPHOS 233* 250* 244*  --   --   --   --   BILITOT 3.8* 3.7* 3.3*  --   --   --   --   GFRNONAA >60 >60 >60   < > >60 >60 >60  ANIONGAP '7 7 7  '$ --  '6 8 5   '$ < > = values in this interval not displayed.     Hematology Recent Labs  Lab 09/27/22 1748 09/28/22 0121 09/28/22 1831 09/29/22 0954  WBC 7.6 8.1  --  8.6  RBC 2.01* 2.01*  --  2.31*  HGB 7.6* 7.4* 8.6* 8.5*  HCT 23.4* 24.0* 27.1* 26.0*  MCV 116.4* 119.4*  --  112.6*  MCH 37.8* 36.8*  --  36.8*  MCHC 32.5 30.8  --  32.7  RDW 20.7* 20.9*  --  23.6*  PLT 180 186  --  164    BNP Recent Labs  Lab 09/24/22 1020 09/25/22 0442  BNP 288.0* 247.0*     Patient Profile     82 y.o. male with CAD s/p CABG (LIMA to LAD, RIMA to distal RCA, SVG to ramus intermediate vessel) x3 in 2003 with subsequent PCI to SVG to ramus intermediate graft in 2017, chronic HFpEF, permanent atrial fibrillation/ flutter s/p remote flutter ablation on Eliquis, moderate to severe aortic stenosis, hypertension, hyperlipidemia, and chronic back pain who was admitted to [facility] on 09/24/2022 for acute on chronic diastolic CHF after presenting with progressive shortness of  breath and lower extremity edema. He also started having recurrent episodes of chest pain during admission. He was diuresed and then transferred to [facility] on 09/27/2022 for right/left cardiac catheterization.  Echocardiogram this admission shows ejection fraction 55 to 60%, moderate left ventricular hypertrophy, severe left atrial enlargement, mild right atrial enlargement, mild mitral regurgitation, moderate to severe aortic stenosis with mean gradient 24 mmHg.  Cardiac catheterization revealed three-vessel coronary artery disease with patent LIMA to the LAD, patent RIMA to the PDA, chronically occluded saphenous vein graft to the ramus, widely patent native left circumflex, mild to moderate aortic stenosis and elevated filling pressures.  Assessment & Plan    1 acute on chronic diastolic congestive heart failure-I and O's not accurate.  Still with some excess volume.  Will continue Lasix, spironolactone and Farxiga at present dose.  Follow renal function. Will switch to po torsemide 80 mg daily today. Probably ok to d/c home this afternoon. He has follow-up in the HF impact clinic on 2/14 and in [place] on 3/15.  2 coronary artery disease-catheterization results as outlined.  Plan medical therapy.  Continue statin.  No aspirin given need for anticoagulation.  3 permanent atrial fibrillation-continue Cardizem and Toprol at present dose for rate control.  Continue apixaban.  4 aortic stenosis-moderate.  Will need follow-up as an outpatient.  5 hypertension-continue present blood pressure medications.  6 hyperlipidemia-continue statin.  7 anemia-some improvement with transfusion.  Further evaluation per primary service.  For questions or updates, please contact [place] Please consult [email or web address] for contact info under   Pixie Casino, MD, FACC, [place] Director of the [facility] &  Cardiovascular Risk Reduction  Clinic Diplomate of the American Board of Clinical Lipidology Attending Cardiologist  Direct Dial: [phone]  Fax: [phone]  Website:  [email or web address]  Pixie Casino, MD  10/01/2022, 8:49 AM

## 2022-10-01 NOTE — Progress Notes (Signed)
Heart Failure Stewardship Pharmacist Progress Note     PCP: Alvira Monday, FNP PCP-Cardiologist: Rozann Lesches, MD      HPI:  82 yo M with PMH of aflutter, CAD s/p CABG x3 (2003) and subsequent PCI, HFpEF, HTN, HLD, moderate aortic stenosis, permanent AF on apixaban. Presented to ED reporting 12 day history of SOB and worsening LE edema despite self-increasing doses of torsemide. Endorsed cough, diminished appetite, weight gain, and intermittent chest pain. In ED was noted to have K 2.9, troponin 18, BNP 288. CXR showed evidence of pulmonary edema. Initiated on diuresis with IV furosemide.   ECHO showed LVEF 55-60% with moderate LV hypertrophy, normal RV, severe mitral annular calcification, and moderate calcification of the aortic valve with severe aortic stenosis. 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 Patient will be medically managed.   Current HF Medications: Beta blocker: Metoprolol succinate 100 mg daily MRA: Spironolactone 12.5 mg daily  SGLT2i: Farxiga 10 mg daily  Diuretic: torsemide 80 mg daily   Prior to admission HF Medications: Beta blocker: Metoprolol succinate 100 mg daily  Diuresis: Torsemide 20 mg BID + additional 40 mg PRN   Pertinent Lab Values: Serum creatinine 1.09, BUN 11, Potassium 3.7, Sodium 136, BNP 247, Magnesium 2.2   Vital Signs: Weight: 208 lbs (admission weight: 225 lbs) Blood pressure: 110/50s  Heart rate: 70-80s  I/O: incomplete   Outpatient Pharmacy:  Prior to admission outpatient [name] Is the patient willing to use [place] pharmacy at discharge? Yes Is the patient willing to transition their outpatient pharmacy to utilize a [facility] outpatient pharmacy?   No   Assessment: 1. Acute on chronic HFpEF (LVEF 55-60%), due to ICM. NYHA class II symptoms. - Agree with transitioning to torsemide 80 mg daily. Strict I/Os and daily  weights. Keep K>4 and Mg>2. - Continue metoprolol succinate 100 mg daily - Continue spironolactone 25 mg daily - Continue Farxiga 10 mg daily   Plan: 1) Medication changes recommended at this time: - KCl 40 mEq x 1   2) Patient assistance: - Farxiga patient assistance application completed and submitted on 1/26  3)  Education  - Patient has been educated on current HF medications and potential additions to HF medication regimen - Patient verbalizes understanding that over the next few months, these medication doses may change and more medications may be added to optimize HF regimen - Patient has been educated on basic disease state pathophysiology and goals of therapy   Kerby Nora, PharmD, BCPS Heart Failure Stewardship Pharmacist Phone [phone]

## 2022-10-01 NOTE — TOC Transition Note (Addendum)
Transition of Care [facility]) - CM/SW Discharge Note   Patient Details  Name: Leonard Cisneros MRN: [id number] Date of Birth: Nov 29, 1940  Transition of Care [facility] CM/SW Contact:  Zenon Mayo, RN Phone Number: 10/01/2022, 9:54 AM   Clinical Narrative:    Patient is for possible dc today, he is set up with [facility], and the order is already in.  He is not on any oxgen.  He has no other needs.   Final next level of care: Home w Home Health Services Barriers to Discharge: Continued Medical Work up   Patient Goals and CMS Choice CMS Medicare.gov Compare Post Acute Care list provided to:: Patient Represenative (must comment) Choice offered to / list presented to : Spouse  Discharge Placement                         Discharge Plan and Services Additional resources added to the After Visit Summary for                            [facility] Arranged: RN   Date [facility] Agency Contacted: 09/25/22 Time [place]: 1152 Representative spoke with at [place]: [place] Determinants of Health ([place]) Interventions SDOH Screenings   Food Insecurity: No Food Insecurity (09/24/2022)  Housing: Low Risk  (09/28/2022)  Transportation Needs: No Transportation Needs (09/24/2022)  Utilities: Not At Risk (09/24/2022)  Alcohol Screen: Low Risk  (09/28/2022)  Depression (PHQ2-9): Low Risk  (04/06/2022)  Financial Resource Strain: Low Risk  (09/28/2022)  Social Connections: Unknown (04/21/2018)  Stress: Stress Concern Present (04/21/2018)  Tobacco Use: Medium Risk (09/28/2022)     Readmission Risk Interventions    09/25/2022   12:42 PM  Readmission Risk Prevention Plan  Transportation Screening Complete  PCP or Specialist Appt within 5-7 Days Not Complete  Home Care Screening Complete  Medication Review (RN CM) Complete

## 2022-10-01 NOTE — Discharge Summary (Signed)
Physician Discharge Summary   Patient: Leonard Cisneros MRN: [id number] DOB: 04-17-1941  Admit date:     09/24/2022  Discharge date: 10/01/22  Discharge Physician: Marylu Lund   PCP: Alvira Monday, FNP   Recommendations at discharge:    Follow up with PCP in 1-2 weeks Follow up with Cardiology as scheduled   Discharge Diagnoses: Principal Problem:   Acute heart failure ([place]) Active Problems:   Hx of CABG   Acute on chronic diastolic congestive heart failure (HCC)   Severe pulmonary hypertension (HCC)   Essential hypertension   PAD (peripheral artery disease) (HCC)   ETOH abuse   High risk medication use   Impaired glucose metabolism   Obesity (BMI 30-39.9)   Neuropathic pain   Exertional angina   Hyperlipidemia LDL goal <70   CAD S/P percutaneous coronary angioplasty   Ischemic cardiomyopathy   Physical deconditioning   Gait instability   Persistent atrial fibrillation (HCC)   Moderate aortic stenosis   Heart failure (HCC)   Hypokalemia   Hyponatremia   Elevated liver enzymes   Hyperbilirubinemia   Volume overload   Acquired thrombophilia (HCC)   Microcytic anemia   Acute respiratory failure with hypoxia (HCC)   Nonrheumatic aortic valve stenosis   Unstable angina ([place])  Resolved Problems:   * No resolved hospital problems. *  Hospital Course: 82 year old gentleman with history of a flutter, ischemic cardiomyopathy status post CABG x 2, coronary artery disease, HFpEF, hypertension, hyperlipidemia, moderate aortic stenosis, permanent atrial fibrillation on apixaban reports that for the past 12 days he has had shortness of breath and increasing edema in the legs.  He has been taking Demadex and increasing the doses at home but it has not been able to control his symptoms although he has had an increase in urination since increasing the doses.  He reports having a cough, diminished appetite, weight gain and intermittent chest pain episodes.  He was evaluated in  the emergency department and noted to have a hemoglobin down to 8.1, potassium down to 2.9, bilirubin elevated at 3.6, sodium down to 131, BNP 288, troponin 18, 18. Chest xray with findings of pulmonary edema.  He was started on IV lasix and admission requested for further diuresis and cardiology consultation.   Patient slowly improved clinically with IV lasix, but continued to have intermittent chest discomfort.  As a result, cardiology requested transfer to [facility] for right and left heart cath.  Assessment and Plan: Acute hypoxic respiratory failure secondary to acute on chronic HFpEF/moderate to severe aortic stenosis: pt failed outpatient management with increasing torsemide doses at home. . Echo showed LVEF of 55-60% with no regional wall motion abnormalities and moderate LVH, normal RV function, severely dilated left atrium, severe MAC with mild MR, and moderate to severe aortic stenosis with mean gradient of 24 mmHg and dimensionless index of 0.34. R/LHC on 1/25 showed chronic occlusion of SVG to ramus intermediate within prior stent but patent LIMA to LAD and RIMA to PDA. Also showed mild to moderate aortic stenosis with mean gradient of 18.5 mmHg and AVA of 2.5 cm^2 with index of 1.15. LV filling pressures were elevated - LVEDP 20 mmHg and mean PCWP 24 mmHG with large V waves to 42 mmHg.  He is on room air saturating 96% but still has bilateral +1 lower extremity edema and clinically appears fluid overloaded. Cardiology on board and managing this.  He is on Toprol-XL.  They have increased his torsemide to 17m daily and added Aldactone  12.5 mg and Farxiga 10 mg p.o. daily on 09/28/2022.   Chronic/permanent Atrial Fibrillation: Rate controlled, continue Cardizem CD 240 mg p.o. daily and Toprol-XL 100 mg p.o. daily.  Continue Eliquis.   Hyperlipidemia: Continue atorvastatin.   Hyperbilirubinemia: Acute on chronic - abd US--no biliary ductal dilatation - suspect Gilbert's - hx of Etoh abuse -  bili did trend down   Hyponatremia: Resolved.   Etoh dependence -drinks 2 beers daily -previous liquor -no signs of withdrawal during hospitalization   Hypokalemia: Resolved.   Acute on chronic macrocytic anemia - follow up stool guaiac testing --ordered - B12 553 - folate 8.6 - iron saturation 56%, ferritin 310 - indices consistent with AOCD in setting of chronic Etoh use.   -Did receive 1 unit PRBC this visit and was on PPI empirically.   Hypertension -Blood pressure remains on the low normal side, continue metoprolol succinate and diltiazem CD   Acquired thrombophilia - continue apixaban for now   Obesity -BMI 30.99 -lifestyle modification and dietary modification counseled.       Consultants: Cardiology Procedures performed:   Disposition: Home Diet recommendation:  Cardiac diet DISCHARGE MEDICATION: Allergies as of 10/01/2022       Reactions   Augmentin [amoxicillin-pot Clavulanate] Nausea And Vomiting, Other (See Comments)   Has patient had a PCN reaction causing immediate rash, facial/tongue/throat swelling, SOB or lightheadedness with hypotension: Unknown Has patient had a PCN reaction causing severe rash involving mucus membranes or skin necrosis: No Has patient had a PCN reaction that required hospitalization: No Has patient had a PCN reaction occurring within the last 10 years: No If all of the above answers are "NO", then may proceed with Cephalosporin use.        Medication List     STOP taking these medications    isosorbide mononitrate 30 MG 24 hr tablet Commonly known as: IMDUR       TAKE these medications    apixaban 5 MG Tabs tablet Commonly known as: ELIQUIS Take 1 tablet (5 mg total) by mouth 2 (two) times daily.   atorvastatin 80 MG tablet Commonly known as: LIPITOR TAKE 1 TABLET BY MOUTH EVERY DAY   dapagliflozin propanediol 10 MG Tabs tablet Commonly known as: FARXIGA Take 1 tablet (10 mg total) by mouth daily. Start  taking on: October 02, 2022   diltiazem 240 MG 24 hr capsule Commonly known as: Dilt-XR TAKE 1 CAPSULE BY MOUTH EVERY DAY   metoprolol succinate 100 MG 24 hr tablet Commonly known as: TOPROL-XL TAKE 1 TABLET BY MOUTH EVERY DAY WITH OR IMMEDIATELY FOLLOWING A MEAL   nitroGLYCERIN 0.4 MG SL tablet Commonly known as: NITROSTAT Place 1 tablet (0.4 mg total) under the tongue every 5 (five) minutes x 3 doses as needed for chest pain (if no relief after 3rd dose, proceed to the ED for an evaluation or call 911).   Potassium Chloride ER 15 MEQ Tbcr Take 2 tablets (30 mEq total) by mouth daily. Start taking on: October 02, 2022   spironolactone 25 MG tablet Commonly known as: ALDACTONE Take 0.5 tablets (12.5 mg total) by mouth daily. Start taking on: October 02, 2022   thiamine 100 MG tablet Commonly known as: Vitamin B-1 Take 1 tablet (100 mg total) by mouth daily. Start taking on: October 02, 2022   Torsemide 40 MG Tabs Take 80 mg by mouth daily. What changed:  medication strength See the new instructions.        Follow-up Information  [place] Follow up.   Why: RN will call to schedule your first home visit.        [facility] [place]. Go in 18 day(s).   Specialty: Cardiology Why: Hospital follow up 10/17/2022 @ 10 am PLEASE bring a current medication list to appointment FREE valet parking, Entrance C, off [employer] information: [address] [phone] [place] [address] [place], [place], [place] Follow up in 2 week(s).   Specialty: Family Medicine Why: Hospital follow up Contact information: [address] [place] [address] [phone]                Discharge Exam: Danley Danker Weights   09/29/22 0505 09/30/22 0057 10/01/22 0538  Weight: 95.7 kg 96.7 kg 94.8 kg   General exam: Awake, laying in bed, in nad Respiratory system: Normal  respiratory effort, no wheezing Cardiovascular system: regular rate, s1, s2 Gastrointestinal system: Soft, nondistended, positive BS Central nervous system: CN2-12 grossly intact, strength intact Extremities: Perfused, no clubbing Skin: Normal skin turgor, no notable skin lesions seen Psychiatry: Mood normal // no visual hallucinations   Condition at discharge: fair  The results of significant diagnostics from this hospitalization (including imaging, microbiology, ancillary and laboratory) are listed below for reference.   Imaging Studies: CARDIAC CATHETERIZATION  Result Date: 09/27/2022   Ost LAD to Prox LAD lesion is 100% stenosed.   Ramus lesion is 100% stenosed.   Prox RCA to Mid RCA lesion is 60% stenosed.   Dist RCA lesion is 90% stenosed.   Origin to Prox Graft lesion is 100% stenosed.   LIMA graft was visualized by angiography and is large.   RIMA graft was visualized by non-selective angiography and is normal in caliber.   The graft exhibits no disease.   The graft exhibits no disease.   LV end diastolic pressure is mildly elevated.   Hemodynamic findings consistent with mild pulmonary hypertension.   There is moderate aortic valve stenosis. 3 vessel occlusive CAD. The native LCx is widely patent LIMA to the LAD is patent RIMA to the PDA is patent SVG to the ramus intermediate is occluded. This appears to be chronic Mild to Moderate aortic stenosis. AV mean gradient 18.5 mm Hg. AVA 2.5 cm squared with index 1.15. Elevated LV filling pressures. LVEDP 20 mm Hg. Mean PCWP 24 mm Hg with large V waves to 42 mm Hg Mild pulmonary HTN. PAP 56/17 with mean 31 mm Hg. Plan: medical management. Continue diuresis. Would benefit greatly from correction of severe anemia. Will need to monitor Aortic stenosis serially going forward.   ECHOCARDIOGRAM COMPLETE  Result Date: 09/25/2022    ECHOCARDIOGRAM REPORT   Patient Name:   TRINTON PREWITT Date of Exam: 09/25/2022 Medical Rec #:  [id number]        Height:        71.0 in Accession #:    [id number]       Weight:       223.1 lb Date of Birth:  [birth year]       BSA:          2.209 m Patient Age:    51 years         BP:           111/54 mmHg Patient Gender: M                HR:  74 bpm. Exam Location:  [facility] Procedure: 2D Echo, Cardiac Doppler and Color Doppler Indications:    CHF  History:        Patient has prior history of Echocardiogram examinations, most                 recent 11/12/2021. CHF and Cardiomyopathy, CAD, Prior CABG, PAD                 and Pulmonary HTN, Arrythmias:Atrial Fibrillation,                 Signs/Symptoms:Chest Pain; Risk Factors:Hypertension and                 Dyslipidemia. ETOH abuse.  Sonographer:    Wenda Low Referring Phys: [place]  1. Left ventricular ejection fraction, by estimation, is 55 to 60%. The left ventricle has normal function. The left ventricle has no regional wall motion abnormalities. There is moderate concentric left ventricular hypertrophy. Left ventricular diastolic parameters are indeterminate.  2. Right ventricular systolic function is normal. The right ventricular size is mildly enlarged. Tricuspid regurgitation signal is inadequate for assessing PA pressure.  3. Left atrial size was severely dilated.  4. Right atrial size was mildly dilated.  5. Left pleural effusion noted.  6. The mitral valve is degenerative. Mild mitral valve regurgitation. Severe mitral annular calcification.  7. The aortic valve is tricuspid. There is moderate calcification of the aortic valve. Aortic valve regurgitation is not visualized. Moderate to severe aortic valve stenosis. Aortic valve mean gradient measures 24.0 mmHg. Dimentionless index 0.34.  8. Aortic dilatation noted. There is mild dilatation of the ascending aorta, measuring 39 mm.  9. The inferior vena cava is dilated in size with <50% respiratory variability, suggesting right atrial pressure of 15 mmHg. Comparison(s): Prior images  reviewed side by side. LVEF 55-60% range. Degenerative, calcific aortic stenosis of moderate to severe range with similar gradients as prior study. FINDINGS  Left Ventricle: Left ventricular ejection fraction, by estimation, is 55 to 60%. The left ventricle has normal function. The left ventricle has no regional wall motion abnormalities. The left ventricular internal cavity size was normal in size. There is  moderate concentric left ventricular hypertrophy. Left ventricular diastolic function could not be evaluated due to atrial fibrillation. Left ventricular diastolic parameters are indeterminate. Right Ventricle: The right ventricular size is mildly enlarged. No increase in right ventricular wall thickness. Right ventricular systolic function is normal. Tricuspid regurgitation signal is inadequate for assessing PA pressure. Left Atrium: Left atrial size was severely dilated. Right Atrium: Right atrial size was mildly dilated. Pericardium: Left pleural effusion noted. There is no evidence of pericardial effusion. Mitral Valve: The mitral valve is degenerative in appearance. Severe mitral annular calcification. Mild mitral valve regurgitation. MV peak gradient, 15.8 mmHg. The mean mitral valve gradient is 4.0 mmHg. Tricuspid Valve: The tricuspid valve is grossly normal. Tricuspid valve regurgitation is mild. Aortic Valve: The aortic valve is tricuspid. There is moderate calcification of the aortic valve. There is mild to moderate aortic valve annular calcification. Aortic valve regurgitation is not visualized. Moderate to severe aortic stenosis is present. Aortic valve mean gradient measures 24.0 mmHg. Aortic valve peak gradient measures 42.1 mmHg. Aortic valve area, by VTI measures 1.06 cm. Pulmonic Valve: The pulmonic valve was grossly normal. Pulmonic valve regurgitation is trivial. Aorta: The aortic root is normal in size and structure and aortic dilatation noted. There is mild dilatation of the ascending  aorta,  measuring 39 mm. Venous: The inferior vena cava is dilated in size with less than 50% respiratory variability, suggesting right atrial pressure of 15 mmHg. IAS/Shunts: No atrial level shunt detected by color flow Doppler. Additional Comments: There is pleural effusion in the left lateral region.  LEFT VENTRICLE PLAX 2D LVIDd:         5.50 cm   Diastology LVIDs:         3.90 cm   LV e' lateral:   12.40 cm/s LV PW:         1.30 cm   LV E/e' lateral: 14.1 LV IVS:        1.40 cm LVOT diam:     2.00 cm LV SV:         79 LV SV Index:   36 LVOT Area:     3.14 cm  RIGHT VENTRICLE RV Basal diam:  4.35 cm RV Mid diam:    3.60 cm RV S prime:     11.70 cm/s TAPSE (M-mode): 2.2 cm LEFT ATRIUM              Index        RIGHT ATRIUM           Index LA diam:        5.50 cm  2.49 cm/m   RA Area:     26.20 cm LA Vol (A2C):   154.0 ml 69.72 ml/m  RA Volume:   85.70 ml  38.80 ml/m LA Vol (A4C):   188.0 ml 85.11 ml/m LA Biplane Vol: 176.0 ml 79.68 ml/m  AORTIC VALVE                     PULMONIC VALVE AV Area (Vmax):    1.13 cm      PV Vmax:       1.22 m/s AV Area (Vmean):   1.05 cm      PV Peak grad:  6.0 mmHg AV Area (VTI):     1.06 cm AV Vmax:           324.50 cm/s AV Vmean:          222.500 cm/s AV VTI:            0.751 m AV Peak Grad:      42.1 mmHg AV Mean Grad:      24.0 mmHg LVOT Vmax:         116.33 cm/s LVOT Vmean:        74.533 cm/s LVOT VTI:          0.253 m LVOT/AV VTI ratio: 0.34  AORTA Ao Root diam: 3.60 cm Ao Asc diam:  3.90 cm MITRAL VALVE MV Area (PHT): 4.83 cm     SHUNTS MV Area VTI:   2.08 cm     Systemic VTI:  0.25 m MV Peak grad:  15.8 mmHg    Systemic Diam: 2.00 cm MV Mean grad:  4.0 mmHg MV Vmax:       1.99 m/s MV Vmean:      82.4 cm/s MV Decel Time: 157 msec MV E velocity: 175.00 cm/s Rozann Lesches MD Electronically signed by Rozann Lesches MD Signature Date/Time: 09/25/2022/12:35:26 PM    Final    DG CHEST PORT 1 VIEW  Result Date: 09/25/2022 CLINICAL DATA:  Wheezing EXAM: PORTABLE CHEST  1 VIEW COMPARISON:  09/24/2022 FINDINGS: Lung volumes are small and pulmonary insufflation has slightly diminished since prior examination. Small bilateral pleural effusions again noted.  Perihilar and lower lung zone interstitial pulmonary infiltrate persists in keeping with mild cardiogenic failure. Coronary artery bypass grafting has been performed. Cardiac size is mildly enlarged, unchanged. No pneumothorax. No acute bone abnormality. IMPRESSION: 1. Stable mild cardiogenic failure with small bilateral pleural effusions. 2. Pulmonary hypoinflation. Electronically Signed   By: Fidela Salisbury M.D.   On: 09/25/2022 03:26   US Abdomen Complete  Result Date: 09/24/2022 CLINICAL DATA:  Elevated LFTs EXAM: ABDOMEN ULTRASOUND COMPLETE COMPARISON:  07/10/2005 FINDINGS: Gallbladder: Normally distended with calculi up to 21 mm diameter. No gallbladder wall thickening, pericholecystic fluid, or sonographic Murphy sign. Common bile duct: Diameter: 3 mm, normal Liver: Echogenic parenchyma, likely fatty infiltration though this can be seen with cirrhosis and certain infiltrative disorders. No focal hepatic mass or nodularity. Portal vein is patent on color Doppler imaging with normal direction of blood flow towards the liver. IVC: Normal appearance Pancreas: Normal appearance Spleen: Normal appearance, 10.3 cm length Right Kidney: Length: 10.1 cm. Normal morphology without mass or hydronephrosis. Left Kidney: Length: 9.6 cm. Normal morphology without mass or hydronephrosis. Abdominal aorta: Normal caliber Other findings: No free fluid.  RIGHT pleural effusion noted. IMPRESSION: Cholelithiasis without evidence of acute cholecystitis or biliary dilatation. Probable fatty infiltration of liver as above. RIGHT pleural effusion. Electronically Signed   By: Lavonia Dana M.D.   On: 09/24/2022 15:25   DG Chest Port 1 View  Result Date: 09/24/2022 CLINICAL DATA:  Shortness of breath. EXAM: PORTABLE CHEST 1 VIEW COMPARISON:  November 11, 2021. FINDINGS: Status post coronary bypass graft. Stable mild cardiomegaly with mild central pulmonary vascular congestion. Probable bibasilar pulmonary edema with small pleural effusions. Bony thorax is unremarkable. IMPRESSION: Stable cardiomegaly with mild central pulmonary vascular congestion and probable bibasilar pulmonary edema and small pleural effusions. Electronically Signed   By: Marijo Conception M.D.   On: 09/24/2022 10:52    Microbiology: Results for orders placed or performed during the hospital encounter of 11/11/21  Resp Panel by RT-PCR (Flu A&B, Covid) Nasopharyngeal Swab     Status: None   Collection Time: 11/11/21  8:51 AM   Specimen: Nasopharyngeal Swab; Nasopharyngeal(NP) swabs in vial transport medium  Result Value Ref Range Status   SARS Coronavirus 2 by RT PCR NEGATIVE NEGATIVE Final    Comment: (NOTE) SARS-CoV-2 target nucleic acids are NOT DETECTED.  The SARS-CoV-2 RNA is generally detectable in upper respiratory specimens during the acute phase of infection. The lowest concentration of SARS-CoV-2 viral copies this assay can detect is 138 copies/mL. A negative result does not preclude SARS-Cov-2 infection and should not be used as the sole basis for treatment or other patient management decisions. A negative result may occur with  improper specimen collection/handling, submission of specimen other than nasopharyngeal swab, presence of viral mutation(s) within the areas targeted by this assay, and inadequate number of viral copies(<138 copies/mL). A negative result must be combined with clinical observations, patient history, and epidemiological information. The expected result is Negative.  Fact Sheet for Patients:  EntrepreneurPulse.com.au  Fact Sheet for Healthcare Providers:  IncredibleEmployment.be  This test is no t yet approved or cleared by the [place] FDA and  has been authorized for detection and/or  diagnosis of SARS-CoV-2 by FDA under an Emergency Use Authorization (EUA). This EUA will remain  in effect (meaning this test can be used) for the duration of the COVID-19 declaration under Section 564(b)(1) of the Act, 21 U.S.C.section 360bbb-3(b)(1), unless the authorization is terminated  or revoked sooner.  Influenza A by PCR NEGATIVE NEGATIVE Final   Influenza B by PCR NEGATIVE NEGATIVE Final    Comment: (NOTE) The Xpert Xpress SARS-CoV-2/FLU/RSV plus assay is intended as an aid in the diagnosis of influenza from Nasopharyngeal swab specimens and should not be used as a sole basis for treatment. Nasal washings and aspirates are unacceptable for Xpert Xpress SARS-CoV-2/FLU/RSV testing.  Fact Sheet for Patients: EntrepreneurPulse.com.au  Fact Sheet for Healthcare Providers: IncredibleEmployment.be  This test is not yet approved or cleared by the [place] FDA and has been authorized for detection and/or diagnosis of SARS-CoV-2 by FDA under an Emergency Use Authorization (EUA). This EUA will remain in effect (meaning this test can be used) for the duration of the COVID-19 declaration under Section 564(b)(1) of the Act, 21 U.S.C. section 360bbb-3(b)(1), unless the authorization is terminated or revoked.  Performed at [facility], [address] [place], [place] [address]     Labs: CBC: Recent Labs  Lab 09/26/22 0500 09/27/22 0450 09/27/22 1539 09/27/22 1546 09/27/22 1748 09/28/22 0121 09/28/22 1831 09/29/22 0954  WBC 8.6 8.3  --   --  7.6 8.1  --  8.6  NEUTROABS  --   --   --   --   --   --   --  6.3  HGB 8.0* 7.5*   < > 7.5*  7.5* 7.6* 7.4* 8.6* 8.5*  HCT 24.8* 23.8*   < > 22.0*  22.0* 23.4* 24.0* 27.1* 26.0*  MCV 117.5* 118.4*  --   --  116.4* 119.4*  --  112.6*  PLT 182 173  --   --  180 186  --  164   < > = values in this interval not displayed.   Basic Metabolic Panel: Recent Labs  Lab 09/24/22 1431  09/25/22 0442 09/25/22 0442 09/26/22 0500 09/27/22 0450 09/27/22 1539 09/27/22 1546 09/27/22 1748 09/28/22 0121 09/29/22 0954 09/30/22 0040  NA  --  135   < > 135 136 138 138  137  --  137 137 136  K  --  3.6   < > 4.0 3.9 4.0 4.2  4.2  --  4.7 4.1 3.7  CL  --  100   < > 99 100  --   --   --  102 100 100  CO2  --  28   < > 29 29  --   --   --  29 29 31   GLUCOSE  --  99   < > 121* 99  --   --   --  117* 138* 108*  BUN  --  13   < > 14 14  --   --   --  12 11 11   CREATININE  --  0.89   < > 0.94 0.92  --   --  0.93 0.98 1.07 1.09  CALCIUM  --  8.2*   < > 8.5* 8.4*  --   --   --  8.9 8.9 8.6*  MG 1.9 2.0  --   --  2.2  --   --   --   --  2.2  --    < > = values in this interval not displayed.   Liver Function Tests: Recent Labs  Lab 09/25/22 0442 09/26/22 0500 09/27/22 0450  AST 35 38 41  ALT 29 33 34  ALKPHOS 233* 250* 244*  BILITOT 3.8* 3.7* 3.3*  PROT 6.0* 6.3* 6.2*  ALBUMIN 3.1* 3.3* 3.2*   CBG: No  results for input(s): "GLUCAP" in the last 168 hours.  Discharge time spent: less than 30 minutes.  Signed: Marylu Lund, MD [facility] 10/01/2022

## 2022-10-01 NOTE — Progress Notes (Signed)
[name] to be D/C'd home per MD order. Discussed with the patient and all questions fully answered. TOC meds delivered to bedside. Skin clean, dry and intact without evidence of skin break down, no evidence of skin tears noted.  IV catheter discontinued intact. Site without signs and symptoms of complications. Dressing and pressure applied.  An After Visit Summary was printed and given to the patient.  Patient escorted via [place], and D/C home via private auto.  Melonie Florida  10/01/2022

## 2022-10-02 DIAGNOSIS — E669 Obesity, unspecified: Secondary | ICD-10-CM | POA: Diagnosis not present

## 2022-10-02 DIAGNOSIS — I2510 Atherosclerotic heart disease of native coronary artery without angina pectoris: Secondary | ICD-10-CM | POA: Diagnosis not present

## 2022-10-02 DIAGNOSIS — Z6830 Body mass index (BMI) 30.0-30.9, adult: Secondary | ICD-10-CM | POA: Diagnosis not present

## 2022-10-02 DIAGNOSIS — I739 Peripheral vascular disease, unspecified: Secondary | ICD-10-CM | POA: Diagnosis not present

## 2022-10-02 DIAGNOSIS — Z951 Presence of aortocoronary bypass graft: Secondary | ICD-10-CM | POA: Diagnosis not present

## 2022-10-02 DIAGNOSIS — I4819 Other persistent atrial fibrillation: Secondary | ICD-10-CM | POA: Diagnosis not present

## 2022-10-02 DIAGNOSIS — I110 Hypertensive heart disease with heart failure: Secondary | ICD-10-CM | POA: Diagnosis not present

## 2022-10-02 DIAGNOSIS — I509 Heart failure, unspecified: Secondary | ICD-10-CM | POA: Diagnosis not present

## 2022-10-02 DIAGNOSIS — E876 Hypokalemia: Secondary | ICD-10-CM | POA: Diagnosis not present

## 2022-10-02 DIAGNOSIS — J9601 Acute respiratory failure with hypoxia: Secondary | ICD-10-CM | POA: Diagnosis not present

## 2022-10-02 DIAGNOSIS — I5033 Acute on chronic diastolic (congestive) heart failure: Secondary | ICD-10-CM | POA: Diagnosis not present

## 2022-10-02 DIAGNOSIS — Z7901 Long term (current) use of anticoagulants: Secondary | ICD-10-CM | POA: Diagnosis not present

## 2022-10-02 DIAGNOSIS — I350 Nonrheumatic aortic (valve) stenosis: Secondary | ICD-10-CM | POA: Diagnosis not present

## 2022-10-05 ENCOUNTER — Encounter: Payer: Self-pay | Admitting: Family Medicine

## 2022-10-05 ENCOUNTER — Encounter: Payer: Medicare HMO | Admitting: Nurse Practitioner

## 2022-10-05 ENCOUNTER — Ambulatory Visit (INDEPENDENT_AMBULATORY_CARE_PROVIDER_SITE_OTHER): Payer: Medicare HMO | Admitting: Family Medicine

## 2022-10-05 VITALS — BP 128/76 | HR 93 | Ht 71.0 in | Wt 208.1 lb

## 2022-10-05 DIAGNOSIS — I5020 Unspecified systolic (congestive) heart failure: Secondary | ICD-10-CM | POA: Diagnosis not present

## 2022-10-05 NOTE — Patient Instructions (Signed)
I appreciate the opportunity to provide care to you today!    Follow up:  3 months  Labs: please stop by the lab today to get your blood drawn (CBC, BMP)  Please continue to limit your sodium intake  I recommend leg elevation to reduce swelling  Please follow-up with the cardiologist and the heart failure failure clinic on 10/17/2022    Please continue to a heart-healthy diet and increase your physical activities. Try to exercise for 18mns at least five times a week.    Physical activity helps: Lower your blood glucose, improve your heart health, lower your blood pressure and cholesterol, burn calories to help manage her weight, gave you energy, lower stress, and improve his sleep.  The American diabetes Association (ADA) recommends being active for 2-1/2 hours (150 minutes) or more week.  Exercise for 30 minutes, 5 days a week (150 minutes total)     It was a pleasure to see you and I look forward to continuing to work together on your health and well-being. Please do not hesitate to call the office if you need care or have questions about your care.   Have a wonderful day and week. With Gratitude, GAlvira MondayMSN, FNP-BC

## 2022-10-05 NOTE — Progress Notes (Unsigned)
Established Patient Office Visit  Subjective:  Patient ID: Leonard Cisneros, male    DOB: Mar 18, 1941  Age: 82 y.o. MRN: [id number]  CC:  Chief Complaint  Patient presents with   Follow-up    Pt would like to follow up from hospital visit from being d/c on 10/01/2022. Pt reports feeling a little lethargic.     HPI HAPPY [place] is a 82 y.o. male with past medical history of flutter, ischemic cardiomyopathy status post CABG x 2, coronary artery disease, HFpEF, hypertension, hyperlipidemia, moderate aortic stenosis, permanent atrial fibrillation on apixaban presents for hospital f/u.   The patient was evaluated in the emergency department on 09/24/2022 with complaints of shortness of breath, lower extremity edema, and chest pain.  Chest x-ray revealed findings of pulmonary edema.  He was started on IV Lasix, but the patient continued to have symptoms of chest pain and was transferred to [facility] for right and left heart cath on 09/27/2022.  Following his discharge on 10/01/22, the patient reports feeling well.  He denies chest pain, shortness of breath, dizziness, fatigue, weakness, and palpitations.  He reports that his swelling has subsided in his lower extremities.  He will be following up with the heart failure clinic on 08/17/2023 and cardiology in March 2024. Past Medical History:  Diagnosis Date   Aortic stenosis    Atrial flutter ([place])    a. s/p remote ablation by Dr. [name].   CAD (coronary artery disease)    a. s/p CABG 2003. b. 01/2016: unstable angina - s/p BMS to SVG-ramus intermediate, patent LIMA-mLAD, patent RIMA-dRCA.    Chronic lower back pain    Essential hypertension    Hyperlipidemia    Ischemic cardiomyopathy    a. Cath 01/2016 -  EF 50-55% with mild distal inferior hypocontractility.   Peripheral neuropathy 04/04/2014   Permanent atrial fibrillation [facility])     Past Surgical History:  Procedure Laterality Date   BACK SURGERY     CARDIAC CATHETERIZATION  ~  2000; 2003   CARDIAC CATHETERIZATION N/A 02/01/2016   Procedure: Left Heart Cath and Cors/Grafts Angiography;  Surgeon: [name], MD;  Location: [place] [facility];  Service: Cardiovascular;  Laterality: N/A;   CARDIAC CATHETERIZATION N/A 02/01/2016   Procedure: Coronary Stent Intervention;  Surgeon: [name], MD;  Location: [place] [facility];  Service: Cardiovascular;  Laterality: N/A;   CATARACT EXTRACTION W/PHACO Left 02/09/2019   Procedure: CATARACT EXTRACTION PHACO AND INTRAOCULAR LENS PLACEMENT (IOC);  Surgeon: [name], MD;  Location: [facility];  Service: Ophthalmology;  Laterality: Left;  CDE: 27.70   CATARACT EXTRACTION W/PHACO Right 02/23/2019   Procedure: CATARACT EXTRACTION PHACO AND INTRAOCULAR LENS PLACEMENT RIGHT EYE;  Surgeon: [name], MD;  Location: [facility];  Service: Ophthalmology;  Laterality: Right;  CDE: 10.15   COLONOSCOPY  06/04/2002   Normal colon and rectum   COLONOSCOPY  07/08/2012   Procedure: COLONOSCOPY;  Surgeon: [name], MD;  Location: [facility];  Service: Endoscopy;  Laterality: N/A;  11:30   COLONOSCOPY N/A 10/02/2017   Procedure: COLONOSCOPY;  Surgeon: [name], MD;  Location: [facility];  Service: Endoscopy;  Laterality: N/A;  10:30am   CORONARY ANGIOPLASTY WITH STENT PLACEMENT  02/01/2016   CORONARY ARTERY BYPASS GRAFT  2003   LIMA to LAD,LIMA to distal RCA,SVG to ramus intermediate vessel.   FEMORAL REVISION Right 03/30/2019   Procedure: Femoral revision right total hip arthroplasty;  Surgeon: [name], MD;  Location: [facility];  Service: Orthopedics;  Laterality: Right;  147mn   FOREIGN BODY REMOVAL Right 04/02/2014   Procedure: FOREIGN BODY REMOVAL RIGHT FOOT;  Surgeon: MJamesetta So MD;  Location: [facility];  Service: General;  Laterality: Right;   JOINT REPLACEMENT     JOINT REPLACEMENT     [place]     REVISION TOTAL HIP ARTHROPLASTY Bilateral 2005-2012   right-left   RIGHT/LEFT HEART CATH AND  CORONARY/GRAFT ANGIOGRAPHY N/A 09/27/2022   Procedure: RIGHT/LEFT HEART CATH AND CORONARY/GRAFT ANGIOGRAPHY;  Surgeon: JMartinique Peter M, MD;  [facility];  Service: Cardiovascular;  Laterality: N/A;   SHOULDER OPEN ROTATOR CUFF REPAIR Right    TOTAL HIP ARTHROPLASTY Right 1999   TOTAL HIP ARTHROPLASTY Left 2008   [place]  11/13/2011   LV mildly dilated,mild concentric LVH,LA mod - severely dilated,RA mildly dilated,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   Social Determinants of Health   Financial Resource Strain: Low Risk  (09/28/2022)   Overall Financial Resource Strain (CARDIA)    Difficulty of Paying Living Expenses: Not hard at all  Food Insecurity: No Food Insecurity (09/24/2022)   Hunger Vital Sign    Worried About Running Out of Food in the Last Year: Never true    Ran Out of Food in the Last Year: Never  true  Transportation Needs: No Transportation Needs (09/24/2022)   PRAPARE - THydrologist(Medical): No    Lack of Transportation (Non-Medical): No  Physical Activity: Not on file  Stress: Stress Concern Present (04/21/2018)   [place]   Feeling of Stress : To some extent  Social Connections: Unknown (04/21/2018)   Social Connection and Isolation Panel [NHANES]    Frequency of Communication with Friends and Family: More than three times a week    Frequency of Social Gatherings with Friends and Family: Three times a week    Attends Religious Services: Patient refused    Active Member of Clubs or Organizations: Patient refused    Attends CArchivistMeetings: Patient refused    Marital  Status: Married  Human resources officer Violence: Not At Risk (09/24/2022)   Humiliation, Afraid, Rape, and Kick questionnaire    Fear of Current or Ex-Partner: No    Emotionally Abused: No    Physically Abused: No    Sexually Abused: No    Outpatient Medications Prior to Visit  Medication Sig Dispense Refill   apixaban (ELIQUIS) 5 MG TABS tablet Take 1 tablet (5 mg total) by mouth 2 (two) times daily. 60 tablet 11   atorvastatin (LIPITOR) 80 MG tablet TAKE 1 TABLET BY MOUTH EVERY DAY 90 tablet 3   dapagliflozin propanediol (FARXIGA) 10 MG TABS tablet Take 1 tablet (10 mg total) by mouth daily. 30 tablet 0   diltiazem (DILT-XR) 240 MG 24 hr capsule TAKE 1 CAPSULE BY MOUTH EVERY DAY 90 capsule 3   metoprolol succinate (TOPROL-XL) 100 MG 24 hr tablet TAKE 1 TABLET BY MOUTH EVERY DAY WITH OR IMMEDIATELY FOLLOWING A MEAL 90 tablet 2   nitroGLYCERIN (NITROSTAT) 0.4 MG SL tablet Place 1 tablet (0.4 mg total) under the tongue every 5 (five) minutes x 3 doses as needed for chest pain (if no relief after 3rd dose, proceed to the ED for an evaluation or call 911). 25 tablet 3   potassium chloride (KLOR-CON M) 10 MEQ  tablet Take 3 tablets (30 mEq total) by mouth daily. 90 tablet 0   spironolactone (ALDACTONE) 25 MG tablet Take 0.5 tablets (12.5 mg total) by mouth daily. 15 tablet 0   thiamine (VITAMIN B1) 100 MG tablet Take 1 tablet (100 mg total) by mouth daily. 30 tablet 0   torsemide (DEMADEX) 20 MG tablet Take 4 tablets (80 mg total) by mouth daily. 120 tablet 0   No facility-administered medications prior to visit.    Allergies  Allergen Reactions   Augmentin [Amoxicillin-Pot Clavulanate] Nausea And Vomiting and Other (See Comments)    Has patient had a PCN reaction causing immediate rash, facial/tongue/throat swelling, SOB or lightheadedness with hypotension: Unknown Has patient had a PCN reaction causing severe rash involving mucus membranes or skin necrosis: No Has patient had a PCN reaction that required hospitalization: No Has patient had a PCN reaction occurring within the last 10 years: No If all of the above answers are "NO", then may proceed with Cephalosporin use.     ROS Review of Systems  Constitutional:  Negative for fatigue and fever.  Eyes:  Negative for visual disturbance.  Respiratory:  Negative for cough, chest tightness, shortness of breath and wheezing.   Cardiovascular:  Negative for chest pain, palpitations and leg swelling.  Neurological:  Negative for dizziness, weakness, numbness and headaches.      Objective:    Physical Exam HENT:     Head: Normocephalic.     Right Ear: External ear normal.     Left Ear: External ear normal.     Nose: No congestion or rhinorrhea.     Mouth/Throat:     Mouth: Mucous membranes are moist.  Cardiovascular:     Rate and Rhythm: Regular rhythm.     Heart sounds: No murmur heard. Pulmonary:     Effort: No respiratory distress.     Breath sounds: Normal breath sounds.  Musculoskeletal:     Right lower leg: Swelling present. No edema.     Left lower leg: Swelling present.  Neurological:     Mental Status: He is alert.      BP 128/76   Pulse 93   Ht '5\' 11"'$  (1.803 m)  Wt 208 lb 1.9 oz (94.4 kg)   SpO2 94%   BMI 29.03 kg/m  Wt Readings from Last 3 Encounters:  10/05/22 208 lb 1.9 oz (94.4 kg)  10/01/22 208 lb 15.9 oz (94.8 kg)  09/17/22 226 lb 6.4 oz (102.7 kg)    Lab Results  Component Value Date   TSH 1.789 11/11/2021   Lab Results  Component Value Date   WBC 6.9 10/05/2022   HGB 8.5 (L) 10/05/2022   HCT 25.2 (L) 10/05/2022   MCV 105 (H) 10/05/2022   PLT 212 10/05/2022   Lab Results  Component Value Date   NA 137 10/05/2022   K 3.9 10/05/2022   CO2 25 10/05/2022   GLUCOSE 100 (H) 10/05/2022   BUN 16 10/05/2022   CREATININE 1.13 10/05/2022   BILITOT 3.3 (H) 09/27/2022   ALKPHOS 244 (H) 09/27/2022   AST 41 09/27/2022   ALT 34 09/27/2022   PROT 6.2 (L) 09/27/2022   ALBUMIN 3.2 (L) 09/27/2022   CALCIUM 8.8 10/05/2022   ANIONGAP 5 09/30/2022   EGFR 65 10/05/2022   Lab Results  Component Value Date   CHOL 110 11/12/2021   Lab Results  Component Value Date   HDL 74 11/12/2021   Lab Results  Component Value Date   LDLCALC 31 11/12/2021   Lab Results  Component Value Date   TRIG 23 11/12/2021   Lab Results  Component Value Date   CHOLHDL 1.5 11/12/2021   Lab Results  Component Value Date   HGBA1C 5.4 11/11/2021      Assessment & Plan:  Systolic congestive heart failure, unspecified HF chronicity (HCC) Assessment & Plan: Imaging studies reviewed Will assess CBC and BMP today Encouraged to continue taking torsemide 80 mg daily and spironolactone 12.5 mg daily Encouraged to take nitroglycerin 0.4 mg as needed for chest pain      Orders: -     CBC with Differential/Platelet -     BMP8+EGFR  Other orders -     CBC with Differential/Platelet    Follow-up: Return in about 3 months (around 01/03/2023).   Alvira Monday, FNP

## 2022-10-06 LAB — BMP8+EGFR
BUN/Creatinine Ratio: 14 (ref 10–24)
BUN: 16 mg/dL (ref 8–27)
CO2: 25 mmol/L (ref 20–29)
Calcium: 8.8 mg/dL (ref 8.6–10.2)
Chloride: 97 mmol/L (ref 96–106)
Creatinine, Ser: 1.13 mg/dL (ref 0.76–1.27)
Glucose: 100 mg/dL — ABNORMAL HIGH (ref 70–99)
Potassium: 3.9 mmol/L (ref 3.5–5.2)
Sodium: 137 mmol/L (ref 134–144)
eGFR: 65 mL/min/{1.73_m2} (ref >59)

## 2022-10-06 LAB — CBC WITH DIFFERENTIAL/PLATELET
Basophils Absolute: 0.1 10*3/uL (ref 0.0–0.2)
Basos: 1 %
EOS (ABSOLUTE): 0.4 10*3/uL (ref 0.0–0.4)
Eos: 5 %
Hematocrit: 25.2 % — ABNORMAL LOW (ref 37.5–51.0)
Hemoglobin: 8.5 g/dL — ABNORMAL LOW (ref 13.0–17.7)
Immature Grans (Abs): 0 10*3/uL (ref 0.0–0.1)
Immature Granulocytes: 1 %
Lymphocytes Absolute: 1.4 10*3/uL (ref 0.7–3.1)
Lymphs: 20 %
MCH: 35.6 pg — ABNORMAL HIGH (ref 26.6–33.0)
MCHC: 33.7 g/dL (ref 31.5–35.7)
MCV: 105 fL — ABNORMAL HIGH (ref 79–97)
Monocytes Absolute: 0.9 10*3/uL (ref 0.1–0.9)
Monocytes: 14 %
Neutrophils Absolute: 4.2 10*3/uL (ref 1.4–7.0)
Neutrophils: 59 %
Platelets: 212 10*3/uL (ref 150–450)
RBC: 2.39 x10E6/uL — CL (ref 4.14–5.80)
RDW: 19 % — ABNORMAL HIGH (ref 11.6–15.4)
WBC: 6.9 10*3/uL (ref 3.4–10.8)

## 2022-10-06 NOTE — Assessment & Plan Note (Signed)
Imaging studies reviewed Will assess CBC and BMP today Encouraged to continue taking torsemide 80 mg daily and spironolactone 12.5 mg daily Encouraged to take nitroglycerin 0.4 mg as needed for chest pain

## 2022-10-09 DIAGNOSIS — Z951 Presence of aortocoronary bypass graft: Secondary | ICD-10-CM | POA: Diagnosis not present

## 2022-10-09 DIAGNOSIS — E876 Hypokalemia: Secondary | ICD-10-CM | POA: Diagnosis not present

## 2022-10-09 DIAGNOSIS — I5033 Acute on chronic diastolic (congestive) heart failure: Secondary | ICD-10-CM | POA: Diagnosis not present

## 2022-10-09 DIAGNOSIS — I350 Nonrheumatic aortic (valve) stenosis: Secondary | ICD-10-CM | POA: Diagnosis not present

## 2022-10-09 DIAGNOSIS — J9601 Acute respiratory failure with hypoxia: Secondary | ICD-10-CM | POA: Diagnosis not present

## 2022-10-09 DIAGNOSIS — I4819 Other persistent atrial fibrillation: Secondary | ICD-10-CM | POA: Diagnosis not present

## 2022-10-09 DIAGNOSIS — I2510 Atherosclerotic heart disease of native coronary artery without angina pectoris: Secondary | ICD-10-CM | POA: Diagnosis not present

## 2022-10-09 DIAGNOSIS — I739 Peripheral vascular disease, unspecified: Secondary | ICD-10-CM | POA: Diagnosis not present

## 2022-10-09 DIAGNOSIS — Z7901 Long term (current) use of anticoagulants: Secondary | ICD-10-CM | POA: Diagnosis not present

## 2022-10-09 DIAGNOSIS — Z6830 Body mass index (BMI) 30.0-30.9, adult: Secondary | ICD-10-CM | POA: Diagnosis not present

## 2022-10-09 DIAGNOSIS — E669 Obesity, unspecified: Secondary | ICD-10-CM | POA: Diagnosis not present

## 2022-10-09 DIAGNOSIS — I110 Hypertensive heart disease with heart failure: Secondary | ICD-10-CM | POA: Diagnosis not present

## 2022-10-11 DIAGNOSIS — I350 Nonrheumatic aortic (valve) stenosis: Secondary | ICD-10-CM | POA: Diagnosis not present

## 2022-10-11 DIAGNOSIS — I110 Hypertensive heart disease with heart failure: Secondary | ICD-10-CM | POA: Diagnosis not present

## 2022-10-11 DIAGNOSIS — Z6830 Body mass index (BMI) 30.0-30.9, adult: Secondary | ICD-10-CM | POA: Diagnosis not present

## 2022-10-11 DIAGNOSIS — I5033 Acute on chronic diastolic (congestive) heart failure: Secondary | ICD-10-CM | POA: Diagnosis not present

## 2022-10-11 DIAGNOSIS — Z7901 Long term (current) use of anticoagulants: Secondary | ICD-10-CM | POA: Diagnosis not present

## 2022-10-11 DIAGNOSIS — I2510 Atherosclerotic heart disease of native coronary artery without angina pectoris: Secondary | ICD-10-CM | POA: Diagnosis not present

## 2022-10-11 DIAGNOSIS — I739 Peripheral vascular disease, unspecified: Secondary | ICD-10-CM | POA: Diagnosis not present

## 2022-10-11 DIAGNOSIS — Z951 Presence of aortocoronary bypass graft: Secondary | ICD-10-CM | POA: Diagnosis not present

## 2022-10-11 DIAGNOSIS — E669 Obesity, unspecified: Secondary | ICD-10-CM | POA: Diagnosis not present

## 2022-10-11 DIAGNOSIS — I4819 Other persistent atrial fibrillation: Secondary | ICD-10-CM | POA: Diagnosis not present

## 2022-10-11 DIAGNOSIS — J9601 Acute respiratory failure with hypoxia: Secondary | ICD-10-CM | POA: Diagnosis not present

## 2022-10-11 DIAGNOSIS — E876 Hypokalemia: Secondary | ICD-10-CM | POA: Diagnosis not present

## 2022-10-12 ENCOUNTER — Ambulatory Visit: Payer: Medicare HMO | Admitting: Cardiology

## 2022-10-15 NOTE — Addendum Note (Signed)
Addended byAlvira Monday on: 10/15/2022 02:14 PM   Modules accepted: Level of Service

## 2022-10-16 ENCOUNTER — Telehealth (HOSPITAL_COMMUNITY): Payer: Self-pay

## 2022-10-16 NOTE — Telephone Encounter (Signed)
Called to confirm Heart & Vascular Transitions of Care appointment at 10/17/22. Patient reminded to bring all medications and pill box organizer with them. Confirmed patient has transportation. Gave directions, instructed to utilize [place] parking.  Confirmed appointment prior to ending call.

## 2022-10-17 ENCOUNTER — Encounter (HOSPITAL_COMMUNITY): Payer: Self-pay

## 2022-10-17 ENCOUNTER — Ambulatory Visit (HOSPITAL_COMMUNITY)
Admit: 2022-10-17 | Discharge: 2022-10-17 | Disposition: A | Discharge status: Home / Self Care | Payer: Medicare HMO | Source: Ambulatory Visit | Attending: Adult Health | Admitting: Adult Health

## 2022-10-17 VITALS — BP 124/70 | HR 86 | Ht 71.0 in | Wt 210.6 lb

## 2022-10-17 DIAGNOSIS — Z7984 Long term (current) use of oral hypoglycemic drugs: Secondary | ICD-10-CM | POA: Diagnosis not present

## 2022-10-17 DIAGNOSIS — I110 Hypertensive heart disease with heart failure: Secondary | ICD-10-CM | POA: Diagnosis not present

## 2022-10-17 DIAGNOSIS — R69 Illness, unspecified: Secondary | ICD-10-CM | POA: Diagnosis not present

## 2022-10-17 DIAGNOSIS — E876 Hypokalemia: Secondary | ICD-10-CM | POA: Diagnosis not present

## 2022-10-17 DIAGNOSIS — I5033 Acute on chronic diastolic (congestive) heart failure: Secondary | ICD-10-CM | POA: Diagnosis not present

## 2022-10-17 DIAGNOSIS — I5032 Chronic diastolic (congestive) heart failure: Secondary | ICD-10-CM | POA: Insufficient documentation

## 2022-10-17 DIAGNOSIS — Z7901 Long term (current) use of anticoagulants: Secondary | ICD-10-CM | POA: Diagnosis not present

## 2022-10-17 DIAGNOSIS — I482 Chronic atrial fibrillation, unspecified: Secondary | ICD-10-CM | POA: Insufficient documentation

## 2022-10-17 DIAGNOSIS — Z79899 Other long term (current) drug therapy: Secondary | ICD-10-CM | POA: Diagnosis not present

## 2022-10-17 DIAGNOSIS — I480 Paroxysmal atrial fibrillation: Secondary | ICD-10-CM

## 2022-10-17 DIAGNOSIS — I25119 Atherosclerotic heart disease of native coronary artery with unspecified angina pectoris: Secondary | ICD-10-CM

## 2022-10-17 DIAGNOSIS — I2510 Atherosclerotic heart disease of native coronary artery without angina pectoris: Secondary | ICD-10-CM | POA: Diagnosis not present

## 2022-10-17 DIAGNOSIS — F1010 Alcohol abuse, uncomplicated: Secondary | ICD-10-CM

## 2022-10-17 DIAGNOSIS — I083 Combined rheumatic disorders of mitral, aortic and tricuspid valves: Secondary | ICD-10-CM | POA: Insufficient documentation

## 2022-10-17 DIAGNOSIS — I739 Peripheral vascular disease, unspecified: Secondary | ICD-10-CM | POA: Diagnosis not present

## 2022-10-17 DIAGNOSIS — Z951 Presence of aortocoronary bypass graft: Secondary | ICD-10-CM | POA: Insufficient documentation

## 2022-10-17 DIAGNOSIS — I4819 Other persistent atrial fibrillation: Secondary | ICD-10-CM | POA: Diagnosis not present

## 2022-10-17 DIAGNOSIS — J9601 Acute respiratory failure with hypoxia: Secondary | ICD-10-CM | POA: Diagnosis not present

## 2022-10-17 DIAGNOSIS — E669 Obesity, unspecified: Secondary | ICD-10-CM | POA: Diagnosis not present

## 2022-10-17 DIAGNOSIS — Z6830 Body mass index (BMI) 30.0-30.9, adult: Secondary | ICD-10-CM | POA: Diagnosis not present

## 2022-10-17 DIAGNOSIS — I350 Nonrheumatic aortic (valve) stenosis: Secondary | ICD-10-CM | POA: Diagnosis not present

## 2022-10-17 MED ORDER — TORSEMIDE 20 MG PO TABS: ORAL_TABLET | ORAL | 0 refills | Status: DC

## 2022-10-17 NOTE — Progress Notes (Signed)
HEART & VASCULAR TRANSITION OF CARE CONSULT NOTE     Referring Physician:Dr Martinique  Primary Care: Dr Louanna Raw Primary Cardiologist: Dr Domenic Polite   HPI: Referred to clinic by Dr Martinique for heart failure consultation.    Leonard Cisneros is a 82 year old with a history of ETOH abuse, 5 hip replacements, PAD, CAD, CABG 2003, BMS to SVT to SVG to ramus 2017, HFpEF, aortic stenosis, and chronic a fib.   Admitted 09/24/22 with A/C HFpEF. Had cath with 3 vessel CAD patent LCX, LAD, PAD grafts. SVT to ramus occluded, elevated filling pressures, and CO 10 CI 4.7.  Diuresed with IV lasix and transitioned to torsemide 80 mg daily. Discharge weight 209 pounds.   Today he presents with his wife. Overall feeling fine. Walks with a cane or crutches. Ok as long as he can take his time. Limited by joint discomfort and says he is occasionally short of breath.Denies chest pain.  Denies PND/Orthopnea. Appetite ok. Following low salt diet. Drinking 1-2 beers + glass of wine daily. He has not plan to quit drinking. No fever or chills. Weight at home 209-212  pounds. Taking all medications.   Cardiac Testing  Cath 09/2022  3 vessel occlusive CAD. The native LCx is widely patent LIMA to the LAD is patent RIMA to the PDA is patent SVG to the ramus intermediate is occluded. This appears to be chronic Mild to Moderate aortic stenosis. AV mean gradient 18.5 mm Hg. AVA 2.5 cm squared with index 1.15. Elevated LV filling pressures. LVEDP 20 mm Hg. Mean PCWP 24 mm Hg with large V waves to 42 mm Hg Mild pulmonary HTN. PAP 56/17 with mean 31 mm Hg.   Echo 09/2022  Left ventricular ejection fraction, by estimation, is 55 to 60%. The  left ventricle has normal function. The left ventricle has no regional  wall motion abnormalities. There is moderate concentric left ventricular  hypertrophy. Left ventricular  diastolic parameters are indeterminate.   2. Right ventricular systolic function is normal. The right ventricular   size is mildly enlarged. Tricuspid regurgitation signal is inadequate for  assessing PA pressure.   3. Left atrial size was severely dilated.   4. Right atrial size was mildly dilated.   5. Left pleural effusion noted.   6. The mitral valve is degenerative. Mild mitral valve regurgitation.  Severe mitral annular calcification.   7. The aortic valve is tricuspid. There is moderate calcification of the  aortic valve. Aortic valve regurgitation is not visualized. Moderate to  severe aortic valve stenosis. Aortic valve mean gradient measures 24.0  mmHg. Dimentionless index 0.34.   8. Aortic dilatation noted. There is mild dilatation of the ascending  aorta, measuring 39 mm.  Review of Systems: [y] = yes, [ ]$  = no   General: Weight gain [ ]$ ; Weight loss [ ]$ ; Anorexia [ ]$ ; Fatigue [ ]$ ; Fever [ ]$ ; Chills [ ]$ ; Weakness [ Y]  Cardiac: Chest pain/pressure [ ]$ ; Resting SOB [ ]$ ; Exertional SOB [ Y]; Orthopnea [ ]$ ; Pedal Edema [ ]$ ; Palpitations [ ]$ ; Syncope [ ]$ ; Presyncope [ ]$ ; Paroxysmal nocturnal dyspnea[ ]$   Pulmonary: Cough [ ]$ ; Wheezing[ ]$ ; Hemoptysis[ ]$ ; Sputum [ ]$ ; Snoring [ ]$   GI: Vomiting[ ]$ ; Dysphagia[ ]$ ; Melena[ ]$ ; Hematochezia [ ]$ ; Heartburn[ ]$ ; Abdominal pain [ ]$ ; Constipation [ ]$ ; Diarrhea [ ]$ ; BRBPR [ ]$   GU: Hematuria[ ]$ ; Dysuria [ ]$ ; Nocturia[ ]$   Vascular: Pain in legs with walking [Y ]; Pain in feet  with lying flat [ ]$ ; Non-healing sores [ ]$ ; Stroke [ ]$ ; TIA [ ]$ ; Slurred speech [ ]$ ;  Neuro: Headaches[ ]$ ; Vertigo[ ]$ ; Seizures[ ]$ ; Paresthesias[ ]$ ;Blurred vision [ ]$ ; Diplopia [ ]$ ; Vision changes [ ]$   Ortho/Skin: Arthritis [ ]$ ; Joint pain [Y ]; Muscle pain [ ]$ ; Joint swelling [ ]$ ; Back Pain [Y ]; Rash [ ]$   Psych: Depression[ ]$ ; Anxiety[ ]$   Heme: Bleeding problems [ ]$ ; Clotting disorders [ ]$ ; Anemia [ ]$   Endocrine: Diabetes [ ]$ ; Thyroid dysfunction[ ]$    Past Medical History:  Diagnosis Date   Aortic stenosis    Atrial flutter (HCC)    a. s/p remote ablation by Dr. [name].   CAD  (coronary artery disease)    a. s/p CABG 2003. b. 01/2016: unstable angina - s/p BMS to SVG-ramus intermediate, patent LIMA-mLAD, patent RIMA-dRCA.    Chronic lower back pain    Essential hypertension    Hyperlipidemia    Ischemic cardiomyopathy    a. Cath 01/2016 -  EF 50-55% with mild distal inferior hypocontractility.   Peripheral neuropathy 04/04/2014   Permanent atrial fibrillation (HCC)     Current Outpatient Medications  Medication Sig Dispense Refill   apixaban (ELIQUIS) 5 MG TABS tablet Take 1 tablet (5 mg total) by mouth 2 (two) times daily. 60 tablet 11   atorvastatin (LIPITOR) 80 MG tablet TAKE 1 TABLET BY MOUTH EVERY DAY 90 tablet 3   dapagliflozin propanediol (FARXIGA) 10 MG TABS tablet Take 1 tablet (10 mg total) by mouth daily. 30 tablet 0   diltiazem (DILT-XR) 240 MG 24 hr capsule TAKE 1 CAPSULE BY MOUTH EVERY DAY 90 capsule 3   metoprolol succinate (TOPROL-XL) 100 MG 24 hr tablet TAKE 1 TABLET BY MOUTH EVERY DAY WITH OR IMMEDIATELY FOLLOWING A MEAL 90 tablet 2   potassium chloride (KLOR-CON M) 10 MEQ tablet Take 3 tablets (30 mEq total) by mouth daily. 90 tablet 0   spironolactone (ALDACTONE) 25 MG tablet Take 0.5 tablets (12.5 mg total) by mouth daily. 15 tablet 0   thiamine (VITAMIN B1) 100 MG tablet Take 1 tablet (100 mg total) by mouth daily. 30 tablet 0   torsemide (DEMADEX) 20 MG tablet Take 4 tablets (80 mg total) by mouth daily. 120 tablet 0   nitroGLYCERIN (NITROSTAT) 0.4 MG SL tablet Place 1 tablet (0.4 mg total) under the tongue every 5 (five) minutes x 3 doses as needed for chest pain (if no relief after 3rd dose, proceed to the ED for an evaluation or call 911). (Patient not taking: Reported on 10/17/2022) 25 tablet 3   No current facility-administered medications for this encounter.    Allergies  Allergen Reactions   Augmentin [Amoxicillin-Pot Clavulanate] Nausea And Vomiting and Other (See Comments)    Has patient had a PCN reaction causing immediate rash,  facial/tongue/throat swelling, SOB or lightheadedness with hypotension: Unknown Has patient had a PCN reaction causing severe rash involving mucus membranes or skin necrosis: No Has patient had a PCN reaction that required hospitalization: No Has patient had a PCN reaction occurring within the last 10 years: No If all of the above answers are "NO", then may proceed with Cephalosporin use.       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    Employer: RETIRED  Tobacco Use   Smoking status: Former    Packs/day: 0.50    Years: 2.00    Total pack years: 1.00    Types: Cigarettes    Quit date: 09/03/1966    Years since quitting: 56.1   Smokeless tobacco: Never  Vaping Use   Vaping Use: Never used  Substance and Sexual Activity   Alcohol use: Yes    Alcohol/week: 11.0 standard drinks of alcohol    Types: 7 Glasses of wine, 4 Cans of beer per week    Comment: a glass of wine with dinner/ beer on the weekend   Drug use: No   Sexual activity: Not on file  Other Topics Concern   Not on file  Social History Narrative   Lives with his wife.   Social Determinants of Health   Financial Resource Strain: Low Risk  (09/28/2022)   Overall Financial Resource Strain (CARDIA)    Difficulty of Paying Living Expenses: Not hard at all  Food Insecurity: No Food Insecurity (09/24/2022)   Hunger Vital Sign    Worried About Running Out of Food in the Last Year: Never true    Ran Out of Food in the Last Year: Never true  Transportation Needs: No Transportation Needs (09/24/2022)   PRAPARE - Hydrologist (Medical): No    Lack of Transportation (Non-Medical): No  Physical Activity: Not on file  Stress: Stress Concern Present (04/21/2018)    [place]    Feeling of Stress : To some extent  Social Connections: Unknown (04/21/2018)   Social Connection and Isolation Panel [NHANES]    Frequency of Communication with Friends and Family: More than three times a week    Frequency of Social Gatherings with Friends and Family: Three times a week    Attends Religious Services: Patient refused    Active Member of Clubs or Organizations: Patient refused    Attends Archivist Meetings: Patient refused    Marital Status: Married  Human resources officer Violence: Not At Risk (09/24/2022)   Humiliation, Afraid, Rape, and Kick questionnaire    Fear of Current or Ex-Partner: No    Emotionally Abused: No    Physically Abused: No    Sexually Abused: No      Family History  Problem Relation Age of Onset   Heart attack Brother    Colon cancer Neg Hx     Vitals:   10/17/22 0952  BP: 124/70  Pulse: 86  SpO2: 95%  Weight: 95.5 kg (210 lb 9.6 oz)  Height: 5' 11"$  (1.803 m)   Wt Readings from Last 3 Encounters:  10/17/22 95.5 kg (210 lb 9.6 oz)  10/05/22 94.4 kg (208 lb 1.9 oz)  10/01/22 94.8 kg (208 lb 15.9 oz)    PHYSICAL EXAM: General:  Arrived in a wheelchair.  No respiratory difficulty HEENT: normal Neck: supple. JVP 9-10 . Carotids 2+ bilat; no bruits. No lymphadenopathy or thryomegaly  appreciated. Cor: PMI nondisplaced. Irregular rate & rhythm. No rubs, gallops or murmurs. Lungs: clear Abdomen: soft, nontender, nondistended. No hepatosplenomegaly. No bruits or masses. Good bowel sounds. Extremities: no cyanosis, clubbing, rash, R and LLE 1-2+ edema Neuro: alert & oriented x 3, cranial nerves grossly intact. moves all 4 extremities w/o difficulty. Affect pleasant.  ECG: A fib 73 bpm    ASSESSMENT & PLAN: 1. HFpEF Echo EF 55-60% RV normal.  NYHA  III, chronically  GDMT  Diuretic- Reds Clip 38%. He does have some volume overload. Would continue  torsemide 80 mg daily and have asked him to take an extra 20 mg torsemide for weight 211 or greater. I would like his home weight < 210 pounds.  BB- Continue current dose of Toprol XL.  Ace/ARB/ARNI- Hold off.  MRA- Continue spironolactone 12.5 mg daily.  SGLT2i- continue farxiga 10 mg daily  - Discussed low salt food choices and limiting fluid intake.   2. CAD -3 vessel disease. Cath with 3 vessel CAD patent LCX, LAD, PAD grafts. SVG to ramus occluded, - No chest pain.  - Continue eliquis and atorvastatin.   3. Aortic Stenosis - Mild to Moderate aortic stenosis. AV mean gradient 18.5 mm Hg. AVA 2.5 cm squared with index 1.15. Followed every 6 months with Dr Domenic Polite   4. Chronic A fib  -Rate controlled with metoprolol and dilatiazem.  -Cotninue current dose of bb and eliquis.   5. ETOH  Discussed cessation however he has no intention to quit.   Referred to HFSW (PCP, Medications, Transportation, ETOH Abuse, Drug Abuse, Insurance, Financial ):  No Refer to Pharmacy:  No Refer to Home Health: No Refer to Advanced Heart Failure Clinic:  no  Refer to General Cardiology: Followed by Dr Domenic Polite.   Follow up as needed.   Maryana Pittmon NP-C  4:36 PM

## 2022-10-17 NOTE — Patient Instructions (Signed)
Take extra 20 mg Torsemide on days your weight 211 lbs or greater. Follow up with Cardiology as scheduled. Call Heart Failure Clinic if any questions or concerns at [phone].

## 2022-10-17 NOTE — Progress Notes (Signed)
ReDS Vest / Clip - 10/17/22 1000       ReDS Vest / Clip   Station Marker C    Ruler Value 38    ReDS Value Range Moderate volume overload    ReDS Actual Value 38

## 2022-10-24 DIAGNOSIS — I350 Nonrheumatic aortic (valve) stenosis: Secondary | ICD-10-CM | POA: Diagnosis not present

## 2022-10-24 DIAGNOSIS — I4819 Other persistent atrial fibrillation: Secondary | ICD-10-CM | POA: Diagnosis not present

## 2022-10-24 DIAGNOSIS — I739 Peripheral vascular disease, unspecified: Secondary | ICD-10-CM | POA: Diagnosis not present

## 2022-10-24 DIAGNOSIS — E876 Hypokalemia: Secondary | ICD-10-CM | POA: Diagnosis not present

## 2022-10-24 DIAGNOSIS — E669 Obesity, unspecified: Secondary | ICD-10-CM | POA: Diagnosis not present

## 2022-10-24 DIAGNOSIS — Z6830 Body mass index (BMI) 30.0-30.9, adult: Secondary | ICD-10-CM | POA: Diagnosis not present

## 2022-10-24 DIAGNOSIS — J9601 Acute respiratory failure with hypoxia: Secondary | ICD-10-CM | POA: Diagnosis not present

## 2022-10-24 DIAGNOSIS — I5033 Acute on chronic diastolic (congestive) heart failure: Secondary | ICD-10-CM | POA: Diagnosis not present

## 2022-10-24 DIAGNOSIS — I110 Hypertensive heart disease with heart failure: Secondary | ICD-10-CM | POA: Diagnosis not present

## 2022-10-24 DIAGNOSIS — I2510 Atherosclerotic heart disease of native coronary artery without angina pectoris: Secondary | ICD-10-CM | POA: Diagnosis not present

## 2022-10-24 DIAGNOSIS — Z7901 Long term (current) use of anticoagulants: Secondary | ICD-10-CM | POA: Diagnosis not present

## 2022-10-24 DIAGNOSIS — Z951 Presence of aortocoronary bypass graft: Secondary | ICD-10-CM | POA: Diagnosis not present

## 2022-11-01 ENCOUNTER — Encounter: Payer: Self-pay | Admitting: Radiology

## 2022-11-01 DIAGNOSIS — I110 Hypertensive heart disease with heart failure: Secondary | ICD-10-CM | POA: Diagnosis not present

## 2022-11-01 DIAGNOSIS — I5033 Acute on chronic diastolic (congestive) heart failure: Secondary | ICD-10-CM | POA: Diagnosis not present

## 2022-11-01 DIAGNOSIS — Z6830 Body mass index (BMI) 30.0-30.9, adult: Secondary | ICD-10-CM | POA: Diagnosis not present

## 2022-11-01 DIAGNOSIS — E669 Obesity, unspecified: Secondary | ICD-10-CM | POA: Diagnosis not present

## 2022-11-01 DIAGNOSIS — I4819 Other persistent atrial fibrillation: Secondary | ICD-10-CM | POA: Diagnosis not present

## 2022-11-01 DIAGNOSIS — J9601 Acute respiratory failure with hypoxia: Secondary | ICD-10-CM | POA: Diagnosis not present

## 2022-11-01 DIAGNOSIS — E876 Hypokalemia: Secondary | ICD-10-CM | POA: Diagnosis not present

## 2022-11-01 DIAGNOSIS — Z7901 Long term (current) use of anticoagulants: Secondary | ICD-10-CM | POA: Diagnosis not present

## 2022-11-01 DIAGNOSIS — Z951 Presence of aortocoronary bypass graft: Secondary | ICD-10-CM | POA: Diagnosis not present

## 2022-11-01 DIAGNOSIS — I739 Peripheral vascular disease, unspecified: Secondary | ICD-10-CM | POA: Diagnosis not present

## 2022-11-01 DIAGNOSIS — I2510 Atherosclerotic heart disease of native coronary artery without angina pectoris: Secondary | ICD-10-CM | POA: Diagnosis not present

## 2022-11-01 DIAGNOSIS — I350 Nonrheumatic aortic (valve) stenosis: Secondary | ICD-10-CM | POA: Diagnosis not present

## 2022-11-06 DIAGNOSIS — Z7901 Long term (current) use of anticoagulants: Secondary | ICD-10-CM | POA: Diagnosis not present

## 2022-11-06 DIAGNOSIS — J9601 Acute respiratory failure with hypoxia: Secondary | ICD-10-CM | POA: Diagnosis not present

## 2022-11-06 DIAGNOSIS — I5033 Acute on chronic diastolic (congestive) heart failure: Secondary | ICD-10-CM | POA: Diagnosis not present

## 2022-11-06 DIAGNOSIS — E669 Obesity, unspecified: Secondary | ICD-10-CM | POA: Diagnosis not present

## 2022-11-06 DIAGNOSIS — I2510 Atherosclerotic heart disease of native coronary artery without angina pectoris: Secondary | ICD-10-CM | POA: Diagnosis not present

## 2022-11-06 DIAGNOSIS — I110 Hypertensive heart disease with heart failure: Secondary | ICD-10-CM | POA: Diagnosis not present

## 2022-11-06 DIAGNOSIS — Z6830 Body mass index (BMI) 30.0-30.9, adult: Secondary | ICD-10-CM | POA: Diagnosis not present

## 2022-11-06 DIAGNOSIS — I4819 Other persistent atrial fibrillation: Secondary | ICD-10-CM | POA: Diagnosis not present

## 2022-11-06 DIAGNOSIS — I350 Nonrheumatic aortic (valve) stenosis: Secondary | ICD-10-CM | POA: Diagnosis not present

## 2022-11-06 DIAGNOSIS — I739 Peripheral vascular disease, unspecified: Secondary | ICD-10-CM | POA: Diagnosis not present

## 2022-11-06 DIAGNOSIS — E876 Hypokalemia: Secondary | ICD-10-CM | POA: Diagnosis not present

## 2022-11-06 DIAGNOSIS — Z951 Presence of aortocoronary bypass graft: Secondary | ICD-10-CM | POA: Diagnosis not present

## 2022-11-14 ENCOUNTER — Other Ambulatory Visit: Payer: Medicare HMO

## 2022-11-16 ENCOUNTER — Ambulatory Visit: Payer: Medicare HMO | Attending: Cardiology | Admitting: Nurse Practitioner

## 2022-11-16 ENCOUNTER — Encounter (HOSPITAL_COMMUNITY): Payer: Self-pay

## 2022-11-16 ENCOUNTER — Encounter: Payer: Self-pay | Admitting: Nurse Practitioner

## 2022-11-16 VITALS — BP 114/54 | HR 84 | Ht 71.0 in | Wt 223.0 lb

## 2022-11-16 DIAGNOSIS — I872 Venous insufficiency (chronic) (peripheral): Secondary | ICD-10-CM | POA: Diagnosis not present

## 2022-11-16 DIAGNOSIS — R609 Edema, unspecified: Secondary | ICD-10-CM | POA: Diagnosis not present

## 2022-11-16 DIAGNOSIS — R079 Chest pain, unspecified: Secondary | ICD-10-CM | POA: Diagnosis not present

## 2022-11-16 DIAGNOSIS — R062 Wheezing: Secondary | ICD-10-CM | POA: Diagnosis not present

## 2022-11-16 DIAGNOSIS — R635 Abnormal weight gain: Secondary | ICD-10-CM

## 2022-11-16 DIAGNOSIS — I4891 Unspecified atrial fibrillation: Secondary | ICD-10-CM | POA: Diagnosis not present

## 2022-11-16 DIAGNOSIS — I25119 Atherosclerotic heart disease of native coronary artery with unspecified angina pectoris: Secondary | ICD-10-CM | POA: Diagnosis not present

## 2022-11-16 DIAGNOSIS — I5033 Acute on chronic diastolic (congestive) heart failure: Secondary | ICD-10-CM

## 2022-11-16 DIAGNOSIS — I509 Heart failure, unspecified: Secondary | ICD-10-CM | POA: Diagnosis not present

## 2022-11-16 DIAGNOSIS — R0602 Shortness of breath: Secondary | ICD-10-CM | POA: Diagnosis not present

## 2022-11-16 DIAGNOSIS — Z79899 Other long term (current) drug therapy: Secondary | ICD-10-CM | POA: Diagnosis not present

## 2022-11-16 DIAGNOSIS — K922 Gastrointestinal hemorrhage, unspecified: Secondary | ICD-10-CM | POA: Diagnosis not present

## 2022-11-16 DIAGNOSIS — I959 Hypotension, unspecified: Secondary | ICD-10-CM | POA: Diagnosis not present

## 2022-11-16 DIAGNOSIS — Z743 Need for continuous supervision: Secondary | ICD-10-CM | POA: Diagnosis not present

## 2022-11-16 DIAGNOSIS — J811 Chronic pulmonary edema: Secondary | ICD-10-CM | POA: Diagnosis not present

## 2022-11-16 DIAGNOSIS — D649 Anemia, unspecified: Secondary | ICD-10-CM | POA: Diagnosis not present

## 2022-11-16 DIAGNOSIS — Z87891 Personal history of nicotine dependence: Secondary | ICD-10-CM | POA: Diagnosis not present

## 2022-11-16 DIAGNOSIS — Z951 Presence of aortocoronary bypass graft: Secondary | ICD-10-CM | POA: Diagnosis not present

## 2022-11-16 DIAGNOSIS — R600 Localized edema: Secondary | ICD-10-CM | POA: Diagnosis not present

## 2022-11-16 DIAGNOSIS — J449 Chronic obstructive pulmonary disease, unspecified: Secondary | ICD-10-CM | POA: Diagnosis not present

## 2022-11-16 NOTE — Progress Notes (Signed)
Cardiology Office Note:    Date:  11/16/2022  ID:  Leonard Cisneros, DOB [birth year], MRN [id number]  PCP:  Alvira Monday, [place] [facility] Cardiologist:  Rozann Lesches, MD     Referring MD: Renee Rival, FNP   CC: Chest pain, shortness of breath, fatigue, and poor appetite.   History of Present Illness:    Leonard Cisneros is a 82 y.o. male with a hx of the following:  A-flutter, status post ablation Permanent A-fib (on Eliquis) CAD, status post CABG in 2003 Ischemic cardiomyopathy HFpEF Hypertension Hyperlipidemia Moderate aortic stenosis  Underwent CABG in 2003 with LIMA-LAD, SVG-OM, RIMA-RCA, BMS to SVG with LIMA-OM LAD and RIMA-dRCA okay.  EF in 2013 at 45 to 50%, improved to normal.  Ischemic testing 11/2021 was low risk.  TTE 11/2021 revealed normal EF, indeterminate diastolic function due to severe MAC,  RV size mildly enlarged, severely elevated PASP, estimated RVSP 69.8 mmHg, mild MR, moderate calcification of aortic valve with moderate aortic valve stenosis, mean gradient measuring 27.0 mmHg, aortic dilatation noted of ascending aorta, measuring 41 mm.  Admitted 09/2022 for acute hypoxic respiratory failure d/t acute on chronic HFpEF/ moderate to severe AS. TTE revealed EF 55 to 60%, no RWMA, moderate LVH mild MR, moderate to severe AS with mean gradient measuring 24.0 mmHg, mild dilatation of ascending aorta measuring 39 mm.  Slowly improved with IV diuresis, continued to have intermittent chest discomfort.  Transferred to [facility] and underwent right and left heart cath that revealed three-vessel occlusive CAD, widely patent native left circumflex, LIMA to LAD, and RIMA to PDA.  SVG to ramus intermediate was occluded, chronic.  Mild to moderate aortic stenosis with mean gradient at 18.5 mmHg, elevated LV filling pressures noted with mild pulmonary hypertension. Meds adjusted.  Hyperbilirubinemia noted, acute on chronic during hospitalization,  abdominal ultrasound revealed no biliary ductal dilatation, Gilbert's was suspected, bili did trend down, low Na+ and K+ resolved. Noted to drink 2 beers daily.  Did have some anemia and received 1 unit PRBC, follow-up stool guaiac testing was ordered.  Discharge weight, 209 pounds.  Seen at HF clinic 10/17/2022.  Was told to follow-up as needed.  Today he presents for follow-up with his wife.  He states he is not doing well. Admits to shortness of breath, swelling, fatigue, and poor appetite for the past month, seems to be getting worse over time. Admits to episodes of chest pain, left sided, stable over time, having some now. Difficult for him to describe. Wt is up 14 lbs from over 1 month ago. Not taking aldactone and says he is not sure he has any. Has occasionally taken an extra 20 mg of Torsemide, not consistently. Denies any palpitations, syncope, presyncope, dizziness, orthopnea, PND,  acute bleeding, or claudication.  Past Medical History:  Diagnosis Date   Aortic stenosis    Atrial flutter ([place])    a. s/p remote ablation by Dr. [name].   CAD (coronary artery disease)    a. s/p CABG 2003. b. 01/2016: unstable angina - s/p BMS to SVG-ramus intermediate, patent LIMA-mLAD, patent RIMA-dRCA.    Chronic lower back pain    Essential hypertension    Hyperlipidemia    Ischemic cardiomyopathy    a. Cath 01/2016 -  EF 50-55% with mild distal inferior hypocontractility.   Peripheral neuropathy 04/04/2014   Permanent atrial fibrillation [facility])     Past Surgical History:  Procedure Laterality Date   BACK SURGERY  CARDIAC CATHETERIZATION  ~ 2000; 2003   CARDIAC CATHETERIZATION N/A 02/01/2016   Procedure: Left Heart Cath and Cors/Grafts Angiography;  Surgeon: [name], MD;  Location: [place] CV LAB;  Service: Cardiovascular;  Laterality: N/A;   CARDIAC CATHETERIZATION N/A 02/01/2016   Procedure: Coronary Stent Intervention;  Surgeon: [name], MD;  Location: [place] CV LAB;   Service: Cardiovascular;  Laterality: N/A;   CATARACT EXTRACTION W/PHACO Left 02/09/2019   Procedure: CATARACT EXTRACTION PHACO AND INTRAOCULAR LENS PLACEMENT (IOC);  Surgeon: [name], MD;  Location: [facility];  Service: Ophthalmology;  Laterality: Left;  CDE: 27.70   CATARACT EXTRACTION W/PHACO Right 02/23/2019   Procedure: CATARACT EXTRACTION PHACO AND INTRAOCULAR LENS PLACEMENT RIGHT EYE;  Surgeon: [name], MD;  Location: [facility];  Service: Ophthalmology;  Laterality: Right;  CDE: 10.15   COLONOSCOPY  06/04/2002   Normal colon and rectum   COLONOSCOPY  07/08/2012   Procedure: COLONOSCOPY;  Surgeon: [name], MD;  Location: [facility];  Service: Endoscopy;  Laterality: N/A;  11:30   COLONOSCOPY N/A 10/02/2017   Procedure: COLONOSCOPY;  Surgeon: [name], MD;  Location: [facility];  Service: Endoscopy;  Laterality: N/A;  10:30am   CORONARY ANGIOPLASTY WITH STENT PLACEMENT  02/01/2016   CORONARY ARTERY BYPASS GRAFT  2003   LIMA to LAD,LIMA to distal RCA,SVG to ramus intermediate vessel.   FEMORAL REVISION Right 03/30/2019   Procedure: Femoral revision right total hip arthroplasty;  Surgeon: [name], MD;  Location: [facility];  Service: Orthopedics;  Laterality: Right;  159min   FOREIGN BODY REMOVAL Right 04/02/2014   Procedure: FOREIGN BODY REMOVAL RIGHT FOOT;  Surgeon: [name], MD;  Location: [facility];  Service: General;  Laterality: Right;   JOINT REPLACEMENT     JOINT REPLACEMENT     LUMBAR [place] SURGERY     REVISION TOTAL HIP ARTHROPLASTY Bilateral 2005-2012   right-left   RIGHT/LEFT HEART CATH AND CORONARY/GRAFT ANGIOGRAPHY N/A 09/27/2022   Procedure: RIGHT/LEFT HEART CATH AND CORONARY/GRAFT ANGIOGRAPHY;  Surgeon: Martinique, Peter M, MD;  Location: [place] CV LAB;  Service: Cardiovascular;  Laterality: N/A;   SHOULDER OPEN ROTATOR CUFF REPAIR Right    TOTAL HIP ARTHROPLASTY Right 1999   TOTAL HIP ARTHROPLASTY Left 2008   US ECHOCARDIOGRAPHY  11/13/2011    LV mildly dilated,mild concentric LVH,LA mod - severely dilated,RA mildly dilated,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    Current Medications: Current Meds  Medication Sig   apixaban (ELIQUIS) 5 MG TABS tablet Take 1 tablet (5 mg total) by mouth 2 (two) times daily.   atorvastatin (LIPITOR) 80 MG tablet TAKE 1 TABLET BY MOUTH EVERY DAY   diltiazem (DILT-XR) 240 MG 24 hr capsule TAKE 1 CAPSULE BY MOUTH EVERY DAY   metoprolol succinate (TOPROL-XL) 100 MG 24 hr tablet TAKE 1 TABLET BY MOUTH EVERY DAY WITH OR IMMEDIATELY FOLLOWING A MEAL   nitroGLYCERIN (NITROSTAT) 0.4 MG SL tablet Place 1 tablet (0.4 mg total) under the tongue every 5 (five) minutes x 3 doses as needed for chest pain (if no relief after 3rd dose, proceed to the ED for an evaluation or call 911).   potassium chloride (KLOR-CON M) 10 MEQ tablet Take 3 tablets (30 mEq total) by mouth daily.   torsemide (DEMADEX) 20 MG tablet Take 80 mg (4 tablets) daily. If weight 211 lbs or greater, take extra 20 mg Lasix that day  Allergies:   Augmentin [amoxicillin-pot clavulanate]   Social History   Socioeconomic History   Marital status: Married    Spouse name: Jeani Hawking   Number of children: 2   Years of education: Not on file   Highest education level: Bachelor's degree (e.g., BA, AB, BS)  Occupational History   Occupation: PT Tourist information centre manager: RETIRED  Tobacco Use   Smoking status: Former    Packs/day: 0.50    Years: 2.00    Additional pack years: 0.00    Total pack years: 1.00    Types: Cigarettes    Quit date: 09/03/1966    Years since quitting: 56.2   Smokeless tobacco: Never  Vaping Use   Vaping Use: Never used  Substance and Sexual Activity   Alcohol use: Yes    Alcohol/week: 11.0 standard drinks of alcohol    Types: 7 Glasses of wine, 4 Cans of beer per week    Comment: a glass of wine with dinner/ beer on the weekend    Drug use: No   Sexual activity: Not on file  Other Topics Concern   Not on file  Social History Narrative   Lives with his wife.   Social Determinants of Health   Financial Resource Strain: Low Risk  (09/28/2022)   Overall Financial Resource Strain (CARDIA)    Difficulty of Paying Living Expenses: Not hard at all  Food Insecurity: No Food Insecurity (09/24/2022)   Hunger Vital Sign    Worried About Running Out of Food in the Last Year: Never true    Ran Out of Food in the Last Year: Never true  Transportation Needs: No Transportation Needs (09/24/2022)   PRAPARE - Hydrologist (Medical): No    Lack of Transportation (Non-Medical): No  Physical Activity: Not on file  Stress: Stress Concern Present (04/21/2018)   [place]    Feeling of Stress : To some extent  Social Connections: Unknown (04/21/2018)   Social Connection and Isolation Panel [NHANES]    Frequency of Communication with Friends and Family: More than three times a week    Frequency of Social Gatherings with Friends and Family: Three times a week    Attends Religious Services: Patient declined    Active Member of Clubs or Organizations: Patient declined    Attends Archivist Meetings: Patient declined    Marital Status: Married     Family History: The patient's family history includes Heart attack in his brother. There is no history of Colon cancer.  ROS:     Please see the history of present illness.    All other systems reviewed and are negative.  EKGs/Labs/Other Studies Reviewed:    The following studies were reviewed today:   EKG:  EKG is ordered today.  The ekg ordered today demonstrates atrial fibrillation, 69 bpm, with minimal LVH and ST/T wave abnormality for what appears to be a repolarization abnormality.   Right and left heart cath on 09/27/2022:    Ost LAD to Prox LAD lesion is 100% stenosed.    Ramus lesion is 100% stenosed.   Prox RCA to Mid RCA lesion is 60% stenosed.   Dist RCA lesion is 90% stenosed.   Origin to Prox Graft lesion is 100% stenosed.   LIMA graft was visualized by angiography and is large.   RIMA graft was visualized by non-selective angiography and is normal in caliber.   The  graft exhibits no disease.   The graft exhibits no disease.   LV end diastolic pressure is mildly elevated.   Hemodynamic findings consistent with mild pulmonary hypertension.   There is moderate aortic valve stenosis.   3 vessel occlusive CAD. The native LCx is widely patent LIMA to the LAD is patent RIMA to the PDA is patent SVG to the ramus intermediate is occluded. This appears to be chronic Mild to Moderate aortic stenosis. AV mean gradient 18.5 mm Hg. AVA 2.5 cm squared with index 1.15. Elevated LV filling pressures. LVEDP 20 mm Hg. Mean PCWP 24 mm Hg with large V waves to 42 mm Hg Mild pulmonary HTN. PAP 56/17 with mean 31 mm Hg.    Plan: medical management. Continue diuresis. Would benefit greatly from correction of severe anemia. Will need to monitor Aortic stenosis serially going forward.   TTE 09/25/2022:   1. Left ventricular ejection fraction, by estimation, is 55 to 60%. The  left ventricle has normal function. The left ventricle has no regional  wall motion abnormalities. There is moderate concentric left ventricular  hypertrophy. Left ventricular  diastolic parameters are indeterminate.   2. Right ventricular systolic function is normal. The right ventricular  size is mildly enlarged. Tricuspid regurgitation signal is inadequate for  assessing PA pressure.   3. Left atrial size was severely dilated.   4. Right atrial size was mildly dilated.   5. Left pleural effusion noted.   6. The mitral valve is degenerative. Mild mitral valve regurgitation.  Severe mitral annular calcification.   7. The aortic valve is tricuspid. There is moderate calcification of the  aortic  valve. Aortic valve regurgitation is not visualized. Moderate to  severe aortic valve stenosis. Aortic valve mean gradient measures 24.0  mmHg. Dimentionless index 0.34.   8. Aortic dilatation noted. There is mild dilatation of the ascending  aorta, measuring 39 mm.   9. The inferior vena cava is dilated in size with <50% respiratory  variability, suggesting right atrial pressure of 15 mmHg.   Comparison(s): Prior images reviewed side by side. LVEF 55-60% range.  Degenerative, calcific aortic stenosis of moderate to severe range with  similar gradients as prior study.  Myoview on November 12, 2021: IMPRESSION: 1. No reversible ischemia or infarction.   2. Normal left ventricular wall motion.   3. Left ventricular ejection fraction 62%   4. Non invasive risk stratification*: Low  Echocardiogram on November 12, 2021:  1. Left ventricular ejection fraction, by estimation, is 55 to 60%. The  left ventricle has normal function. The left ventricle has no regional  wall motion abnormalities. There is mild left ventricular hypertrophy.  Diastolic function indeterminant due to   severe MAC. Elevated left atrial pressure.   2. Right ventricular systolic function is normal. The right ventricular  size is mildly enlarged. There is severely elevated pulmonary artery  systolic pressure. The estimated right ventricular systolic pressure is  [id number] mmHg.   3. Left atrial size was severely dilated.   4. Right atrial size was moderately dilated.   5. The mitral valve is abnormal. Mild mitral valve regurgitation. Severe  mitral annular calcification.   6. The aortic valve has an indeterminant number of cusps. There is  moderate calcification of the aortic valve. There is severe thickening of  the aortic valve. Aortic valve regurgitation is not visualized. Moderate  aortic valve stenosis. Aortic valve  mean gradient measures 27.0 mmHg. Aortic valve Vmax measures 3.42 m/s. AVA  by continuity 1.44cm2,  DI  0.31   7. Aortic dilatation noted. There is mild dilatation of the ascending  aorta, measuring 41 mm.   8. The inferior vena cava is dilated in size with <50% respiratory  variability, suggesting right atrial pressure of 15 mmHg.   Comparison(s): Compared to prior TTE 03/2021, there continues to be  moderate AS (previous mean graident 42mmHg; now 10mmHg). There is now  severe pulmonary HTN (previously not reported).   ABIs on April 14, 2019: Summary:  Right: Resting right ankle-brachial index indicates noncompressible right  lower extremity arteries.The right toe-brachial index is abnormal. ABIs  are unreliable.   Left: Resting left ankle-brachial index indicates noncompressible left  lower extremity arteries.The left toe-brachial index is abnormal. ABIs are  unreliable.  Coronary stent intervention on Feb 01, 2016: Ost LAD lesion, 100% stenosed. Ost Ramus lesion, 100% stenosed. Prox RCA lesion, 70% stenosed. Mid RCA lesion, 50% stenosed. RIMA . LIMA . Prox LAD to Mid LAD lesion, 75% stenosed. SVG . Origin lesion, 99% stenosed. Post intervention, there is a 0% residual stenosis. The left ventricular systolic function is normal.   Low normal LV function with a subtle region of distal inferior hypocontractility.   Significant 2 vessel CAD with total occlusion of the LAD and ramus intermediate at its origin; normal left circumflex coronary artery; and RCA with 70% proximal stenosis with diffuse 50% mid stenoses with a " flush and fill" phenomena seen in the distal RCA due to competitive filling the the RIMA graft.   Patent LIMA graft supplying the mid LAD.   Patent RIMA graft supplying the distal RCA.   SVG supplying the ramus intermediate vessel with a 99% near ostial subtotal stenosis with thrombus.   Successful PCI to the SVG with ultimate insertion of a 2.512 mm NirXcell bare metal stent postdilated to 2.64 mm with a 99% stenosis being reduced to 0% resumption of brisk  TIMI-3 flow.   RECOMMENDATION: The patient will be on triple therapy with aspirin/Plavix/Coumadin for a month.  Coumadin will be restarted tonight and after 1 month he will will be transitioned to dual therapy.    Recent Labs: 09/25/2022: B Natriuretic Peptide 247.0 09/27/2022: ALT 34 09/29/2022: Magnesium 2.2 10/05/2022: BUN 16; Creatinine, Ser 1.13; Hemoglobin 8.5; Platelets 212; Potassium 3.9; Sodium 137  Recent Lipid Panel    Component Value Date/Time   CHOL 110 11/12/2021 0042   CHOL 140 10/04/2021 0843   TRIG 23 11/12/2021 0042   HDL 74 11/12/2021 0042   HDL 93 10/04/2021 0843   CHOLHDL 1.5 11/12/2021 0042   VLDL 5 11/12/2021 0042   LDLCALC 31 11/12/2021 0042   LDLCALC 35 10/04/2021 0843     Risk Assessment/Calculations:    CHA2DS2-VASc Score = 4  This indicates a 4.8% annual risk of stroke. The patient's score is based upon: CHF History: 1 HTN History: 1 Diabetes History: 0 Stroke History: 0 Vascular Disease History: 0 Age Score: 2 Gender Score: 0   Physical Exam:    VS:  BP (!) 114/54   Pulse 84   Ht 5\' 11"  (1.803 m)   Wt 223 lb (101.2 kg)   SpO2 92%   BMI 31.10 kg/m     Wt Readings from Last 3 Encounters:  11/16/22 223 lb (101.2 kg)  10/17/22 210 lb 9.6 oz (95.5 kg)  10/05/22 208 lb 1.9 oz (94.4 kg)     GEN: Obese, 82 y.o. male 82 year old male in acute distress HEENT: Normal NECK: No JVD; No carotid bruits CARDIAC: S1/S2,  irregular rhythm and regular rate, Grade 3/6 systolic murmur, no rubs or gallops; 2+ radial pulses, 1+ PT RESPIRATORY:  Clear to auscultation with inspiratory wheezing scattered, no rales or rhonchi, increased work of breathing and short of breath with minimal exertion (talking to me) MUSCULOSKELETAL: pitting upper extremity edema, nonpitting edema along BLE; No deformity  SKIN: Peeling/dry skin along bilateral lower extremities with skin discoloration, warm and dry  NEUROLOGIC:  Alert and oriented x 3 PSYCHIATRIC:  Normal affect    ASSESSMENT:    1. Chest pain, unspecified type   2. Coronary artery disease involving native heart with angina pectoris, unspecified vessel or lesion type ([place])   3. Acute on chronic diastolic congestive heart failure ([place])   4. Peripheral edema   5. Weight gain   6. Shortness of breath   7. Inspiratory wheezing    PLAN:    In order of problems listed above:  CAD, status post CABG in 2003, Chest pain of uncertain etiology Acute on chronic HFpEF, peripheral edema, weight gain Shortness of breath/ Inspiratory wheezing  Patient is a 82 year old male with past medical history as mentioned above.  Comes in today for evaluation, not doing well.  Chief complaint is shortness of breath, swelling, fatigue, and poor appetite for the past month, seems to be getting worse over time. Having active chest pain in office, left sided. Difficult for him to describe. EKG reassuring today without acute ischemic changes. Wt is up 14 lbs from over 1 month ago. PE and previous workup mentioned above. Discussed outpatient versus inpatient management, and patient requested inpatient management to feel better ASAP.  I agreed to inpatient management based on active chest pain and other symptoms of volume overload needed to be addressed inpatient.  Patient was transported via EMS to [facility] for evaluation.  Upon arrival to the ED, I recommend the following be performed/obtained: Twelve-lead EKG and close monitoring of vital signs Lab work including the following: CBC with differential, CMET, troponins, D-dimer, lactate, respiratory viral panel, and electrolytes Imaging including: 2 view chest x-ray and CT scan to rule out PE/aortic dissection Admit for further observation.  IV diuresis and close monitoring of labs.  Daily weights, low-salt/heart healthy diet < 2,000 mg Na+ diet, and fluid restrictions less than 1500 mL/day. Refer to Dietician d/t poor appetite. Discharge home patient is in stable condition,  chest pain-free, and is back to baseline weight of around 209 lbs.  Follow-up with outpatient cardiology in 1-2 weeks post discharge.  Medication Adjustments/Labs and Tests Ordered: Current medicines are reviewed at length with the patient today.  Concerns regarding medicines are outlined above.  Orders Placed This Encounter  Procedures   EKG 12-Lead   No orders of the defined types were placed in this encounter.   Patient Instructions  Medication Instructions:  Continue all current medications.   Labwork: none  Testing/Procedures: none  Follow-Up: Pending   Any Other Special Instructions Will Be Listed Below (If Applicable). Going to [facility] ED via EMS.   If you need a refill on your cardiac medications before your next appointment, please call your pharmacy.    SignedFinis Bud, NP  11/16/2022 12:09 PM    House

## 2022-11-16 NOTE — Patient Instructions (Signed)
Medication Instructions:  Continue all current medications.   Labwork: none  Testing/Procedures: none  Follow-Up: Pending   Any Other Special Instructions Will Be Listed Below (If Applicable). Going to [facility] ED   If you need a refill on your cardiac medications before your next appointment, please call your pharmacy.

## 2022-11-18 ENCOUNTER — Inpatient Hospital Stay (HOSPITAL_COMMUNITY)
Admission: RE | Admit: 2022-11-18 | Discharge: 2022-11-22 | DRG: 291 | Disposition: A | Discharge status: Home Health | Payer: Medicare HMO | Source: Other Acute Inpatient Hospital | Attending: Student | Admitting: Student

## 2022-11-18 DIAGNOSIS — I272 Pulmonary hypertension, unspecified: Secondary | ICD-10-CM | POA: Diagnosis not present

## 2022-11-18 DIAGNOSIS — I739 Peripheral vascular disease, unspecified: Secondary | ICD-10-CM | POA: Diagnosis not present

## 2022-11-18 DIAGNOSIS — R0602 Shortness of breath: Principal | ICD-10-CM | POA: Diagnosis present

## 2022-11-18 DIAGNOSIS — Z8679 Personal history of other diseases of the circulatory system: Secondary | ICD-10-CM | POA: Diagnosis not present

## 2022-11-18 DIAGNOSIS — Z6829 Body mass index (BMI) 29.0-29.9, adult: Secondary | ICD-10-CM

## 2022-11-18 DIAGNOSIS — R262 Difficulty in walking, not elsewhere classified: Secondary | ICD-10-CM | POA: Diagnosis not present

## 2022-11-18 DIAGNOSIS — N390 Urinary tract infection, site not specified: Secondary | ICD-10-CM

## 2022-11-18 DIAGNOSIS — D6859 Other primary thrombophilia: Secondary | ICD-10-CM | POA: Diagnosis not present

## 2022-11-18 DIAGNOSIS — F1020 Alcohol dependence, uncomplicated: Secondary | ICD-10-CM | POA: Diagnosis not present

## 2022-11-18 DIAGNOSIS — R69 Illness, unspecified: Secondary | ICD-10-CM | POA: Diagnosis not present

## 2022-11-18 DIAGNOSIS — F1090 Alcohol use, unspecified, uncomplicated: Secondary | ICD-10-CM | POA: Diagnosis not present

## 2022-11-18 DIAGNOSIS — I5033 Acute on chronic diastolic (congestive) heart failure: Secondary | ICD-10-CM | POA: Diagnosis not present

## 2022-11-18 DIAGNOSIS — K2990 Gastroduodenitis, unspecified, without bleeding: Secondary | ICD-10-CM | POA: Diagnosis present

## 2022-11-18 DIAGNOSIS — E876 Hypokalemia: Secondary | ICD-10-CM | POA: Diagnosis not present

## 2022-11-18 DIAGNOSIS — I4891 Unspecified atrial fibrillation: Secondary | ICD-10-CM | POA: Diagnosis not present

## 2022-11-18 DIAGNOSIS — K2970 Gastritis, unspecified, without bleeding: Secondary | ICD-10-CM

## 2022-11-18 DIAGNOSIS — K269 Duodenal ulcer, unspecified as acute or chronic, without hemorrhage or perforation: Secondary | ICD-10-CM | POA: Diagnosis not present

## 2022-11-18 DIAGNOSIS — R195 Other fecal abnormalities: Secondary | ICD-10-CM

## 2022-11-18 DIAGNOSIS — I25119 Atherosclerotic heart disease of native coronary artery with unspecified angina pectoris: Secondary | ICD-10-CM | POA: Diagnosis not present

## 2022-11-18 DIAGNOSIS — K921 Melena: Secondary | ICD-10-CM

## 2022-11-18 DIAGNOSIS — G8929 Other chronic pain: Secondary | ICD-10-CM | POA: Diagnosis not present

## 2022-11-18 DIAGNOSIS — I110 Hypertensive heart disease with heart failure: Principal | ICD-10-CM | POA: Diagnosis present

## 2022-11-18 DIAGNOSIS — K317 Polyp of stomach and duodenum: Secondary | ICD-10-CM | POA: Diagnosis not present

## 2022-11-18 DIAGNOSIS — E669 Obesity, unspecified: Secondary | ICD-10-CM | POA: Diagnosis present

## 2022-11-18 DIAGNOSIS — I872 Venous insufficiency (chronic) (peripheral): Secondary | ICD-10-CM | POA: Diagnosis present

## 2022-11-18 DIAGNOSIS — R7989 Other specified abnormal findings of blood chemistry: Secondary | ICD-10-CM | POA: Insufficient documentation

## 2022-11-18 DIAGNOSIS — G629 Polyneuropathy, unspecified: Secondary | ICD-10-CM | POA: Diagnosis present

## 2022-11-18 DIAGNOSIS — Z79899 Other long term (current) drug therapy: Secondary | ICD-10-CM | POA: Diagnosis not present

## 2022-11-18 DIAGNOSIS — I509 Heart failure, unspecified: Secondary | ICD-10-CM

## 2022-11-18 DIAGNOSIS — K3189 Other diseases of stomach and duodenum: Secondary | ICD-10-CM | POA: Diagnosis not present

## 2022-11-18 DIAGNOSIS — I25810 Atherosclerosis of coronary artery bypass graft(s) without angina pectoris: Secondary | ICD-10-CM | POA: Diagnosis present

## 2022-11-18 DIAGNOSIS — D539 Nutritional anemia, unspecified: Secondary | ICD-10-CM | POA: Diagnosis present

## 2022-11-18 DIAGNOSIS — D649 Anemia, unspecified: Secondary | ICD-10-CM | POA: Diagnosis not present

## 2022-11-18 DIAGNOSIS — I350 Nonrheumatic aortic (valve) stenosis: Secondary | ICD-10-CM | POA: Diagnosis not present

## 2022-11-18 DIAGNOSIS — E871 Hypo-osmolality and hyponatremia: Secondary | ICD-10-CM | POA: Diagnosis not present

## 2022-11-18 DIAGNOSIS — J9811 Atelectasis: Secondary | ICD-10-CM | POA: Diagnosis not present

## 2022-11-18 DIAGNOSIS — I2489 Other forms of acute ischemic heart disease: Secondary | ICD-10-CM | POA: Diagnosis not present

## 2022-11-18 DIAGNOSIS — K922 Gastrointestinal hemorrhage, unspecified: Secondary | ICD-10-CM | POA: Diagnosis not present

## 2022-11-18 DIAGNOSIS — Z951 Presence of aortocoronary bypass graft: Secondary | ICD-10-CM

## 2022-11-18 DIAGNOSIS — I4821 Permanent atrial fibrillation: Secondary | ICD-10-CM | POA: Diagnosis not present

## 2022-11-18 DIAGNOSIS — Z955 Presence of coronary angioplasty implant and graft: Secondary | ICD-10-CM

## 2022-11-18 DIAGNOSIS — I255 Ischemic cardiomyopathy: Secondary | ICD-10-CM | POA: Diagnosis not present

## 2022-11-18 DIAGNOSIS — I10 Essential (primary) hypertension: Secondary | ICD-10-CM | POA: Diagnosis present

## 2022-11-18 DIAGNOSIS — D6869 Other thrombophilia: Secondary | ICD-10-CM | POA: Diagnosis not present

## 2022-11-18 DIAGNOSIS — E785 Hyperlipidemia, unspecified: Secondary | ICD-10-CM | POA: Diagnosis present

## 2022-11-18 DIAGNOSIS — Z87891 Personal history of nicotine dependence: Secondary | ICD-10-CM

## 2022-11-18 DIAGNOSIS — Z96643 Presence of artificial hip joint, bilateral: Secondary | ICD-10-CM | POA: Diagnosis present

## 2022-11-18 DIAGNOSIS — M545 Low back pain, unspecified: Secondary | ICD-10-CM | POA: Diagnosis present

## 2022-11-18 DIAGNOSIS — J449 Chronic obstructive pulmonary disease, unspecified: Secondary | ICD-10-CM | POA: Diagnosis not present

## 2022-11-18 DIAGNOSIS — D500 Iron deficiency anemia secondary to blood loss (chronic): Secondary | ICD-10-CM | POA: Diagnosis not present

## 2022-11-18 DIAGNOSIS — Z961 Presence of intraocular lens: Secondary | ICD-10-CM | POA: Diagnosis present

## 2022-11-18 DIAGNOSIS — Z881 Allergy status to other antibiotic agents status: Secondary | ICD-10-CM

## 2022-11-18 DIAGNOSIS — Z8249 Family history of ischemic heart disease and other diseases of the circulatory system: Secondary | ICD-10-CM

## 2022-11-18 DIAGNOSIS — R600 Localized edema: Secondary | ICD-10-CM | POA: Diagnosis not present

## 2022-11-18 DIAGNOSIS — Z7901 Long term (current) use of anticoagulants: Secondary | ICD-10-CM

## 2022-11-18 DIAGNOSIS — K8020 Calculus of gallbladder without cholecystitis without obstruction: Secondary | ICD-10-CM | POA: Diagnosis not present

## 2022-11-18 DIAGNOSIS — J90 Pleural effusion, not elsewhere classified: Secondary | ICD-10-CM | POA: Diagnosis not present

## 2022-11-18 LAB — COMPREHENSIVE METABOLIC PANEL
ALT: 26 U/L (ref 0–44)
AST: 26 U/L (ref 15–41)
Albumin: 3 g/dL — ABNORMAL LOW (ref 3.5–5.0)
Alkaline Phosphatase: 198 U/L — ABNORMAL HIGH (ref 38–126)
Anion gap: 9 (ref 5–15)
BUN: 15 mg/dL (ref 8–23)
CO2: 26 mmol/L (ref 22–32)
Calcium: 8.7 mg/dL — ABNORMAL LOW (ref 8.9–10.3)
Chloride: 97 mmol/L — ABNORMAL LOW (ref 98–111)
Creatinine, Ser: 0.78 mg/dL (ref 0.61–1.24)
GFR, Estimated: >60 mL/min (ref >60)
Glucose, Bld: 97 mg/dL (ref 70–99)
Potassium: 4.2 mmol/L (ref 3.5–5.1)
Sodium: 132 mmol/L — ABNORMAL LOW (ref 135–145)
Total Bilirubin: 3 mg/dL — ABNORMAL HIGH (ref 0.3–1.2)
Total Protein: 5.7 g/dL — ABNORMAL LOW (ref 6.5–8.1)

## 2022-11-18 LAB — TSH: TSH: 2.685 u[IU]/mL (ref 0.350–4.500)

## 2022-11-18 LAB — CBC WITH DIFFERENTIAL/PLATELET
Abs Immature Granulocytes: 0.05 10*3/uL (ref 0.00–0.07)
Basophils Absolute: 0 10*3/uL (ref 0.0–0.1)
Basophils Relative: 0 %
Eosinophils Absolute: 0.3 10*3/uL (ref 0.0–0.5)
Eosinophils Relative: 3 %
HCT: 23.2 % — ABNORMAL LOW (ref 39.0–52.0)
Hemoglobin: 7.5 g/dL — ABNORMAL LOW (ref 13.0–17.0)
Immature Granulocytes: 1 %
Lymphocytes Relative: 13 %
Lymphs Abs: 1 10*3/uL (ref 0.7–4.0)
MCH: 37.1 pg — ABNORMAL HIGH (ref 26.0–34.0)
MCHC: 32.3 g/dL (ref 30.0–36.0)
MCV: 114.9 fL — ABNORMAL HIGH (ref 80.0–100.0)
Monocytes Absolute: 0.7 10*3/uL (ref 0.1–1.0)
Monocytes Relative: 10 %
Neutro Abs: 5.5 10*3/uL (ref 1.7–7.7)
Neutrophils Relative %: 73 %
Platelets: 164 10*3/uL (ref 150–400)
RBC: 2.02 MIL/uL — ABNORMAL LOW (ref 4.22–5.81)
RDW: 25.1 % — ABNORMAL HIGH (ref 11.5–15.5)
WBC: 7.5 10*3/uL (ref 4.0–10.5)
nRBC: 0 % (ref 0.0–0.2)

## 2022-11-18 LAB — PHOSPHORUS: Phosphorus: 3.6 mg/dL (ref 2.5–4.6)

## 2022-11-18 LAB — BRAIN NATRIURETIC PEPTIDE: B Natriuretic Peptide: 779.5 pg/mL — ABNORMAL HIGH (ref 0.0–100.0)

## 2022-11-18 LAB — MAGNESIUM: Magnesium: 2.3 mg/dL (ref 1.7–2.4)

## 2022-11-18 LAB — PROTIME-INR
INR: 1.3 — ABNORMAL HIGH (ref 0.8–1.2)
Prothrombin Time: 16 seconds — ABNORMAL HIGH (ref 11.4–15.2)

## 2022-11-18 MED ORDER — ACETAMINOPHEN 325 MG PO TABS: 650.0000 mg | ORAL_TABLET | Freq: Four times a day (QID) | ORAL | Status: DC | PRN

## 2022-11-18 MED ORDER — FUROSEMIDE 10 MG/ML IJ SOLN
60.0000 mg | Freq: Once | INTRAMUSCULAR | Status: AC
  Administered 2022-11-19: 60 mg via INTRAVENOUS
  Filled 2022-11-18: qty 6

## 2022-11-18 MED ORDER — ONDANSETRON HCL 4 MG/2ML IJ SOLN: 4.0000 mg | Freq: Four times a day (QID) | INTRAMUSCULAR | Status: DC | PRN

## 2022-11-18 MED ORDER — SODIUM CHLORIDE 0.9 % IV SOLN
1.0000 g | Freq: Once | INTRAVENOUS | Status: AC
  Administered 2022-11-19: 1 g via INTRAVENOUS
  Filled 2022-11-18: qty 10

## 2022-11-18 MED ORDER — SODIUM CHLORIDE 0.9% IV SOLUTION: Freq: Once | INTRAVENOUS | Status: DC

## 2022-11-18 MED ORDER — FUROSEMIDE 10 MG/ML IJ SOLN
20.0000 mg | Freq: Once | INTRAMUSCULAR | Status: AC
  Administered 2022-11-19: 20 mg via INTRAVENOUS
  Filled 2022-11-18: qty 2

## 2022-11-18 MED ORDER — SODIUM CHLORIDE 0.9 % IV SOLN: 1.0000 g | INTRAVENOUS | Status: DC

## 2022-11-18 MED ORDER — LEVALBUTEROL HCL 0.63 MG/3ML IN NEBU: 0.6300 mg | INHALATION_SOLUTION | Freq: Four times a day (QID) | RESPIRATORY_TRACT | Status: DC | PRN

## 2022-11-18 MED ORDER — ACETAMINOPHEN 650 MG RE SUPP: 650.0000 mg | Freq: Four times a day (QID) | RECTAL | Status: DC | PRN

## 2022-11-18 MED ORDER — ONDANSETRON HCL 4 MG PO TABS: 4.0000 mg | ORAL_TABLET | Freq: Four times a day (QID) | ORAL | Status: DC | PRN

## 2022-11-18 MED ORDER — PANTOPRAZOLE SODIUM 40 MG IV SOLR
40.0000 mg | Freq: Two times a day (BID) | INTRAVENOUS | Status: DC
  Administered 2022-11-18 – 2022-11-22 (×8): 40 mg via INTRAVENOUS
  Filled 2022-11-18 (×8): qty 10

## 2022-11-18 NOTE — H&P (Incomplete)
History and Physical    Leonard Cisneros [id number] DOB: Sep 08, 1940 DOA: 11/18/2022  PCP: Alvira Monday, FNP  Patient coming from: [place]  I have personally briefly reviewed patient's old medical records in [place]  Chief Complaint: sob,  black stools  HPI: Leonard Cisneros is a 82 y.o. male with medical history significant of  Aortic stenosis, CAD s/p CABG, Chronic lower back pain ,A-flutter, status post ablation,permanent atrial fibrillation,  Essential hypertension, Ischemic cardiomyopathy , HFpEF,  Hyperlipidemia,Moderate aortic stenosis  who presents to [facility] ED with  progressive sob x few weeks,weight gain and increase lower extremity edema despite compliance with diuretics.  Patient was initially dwas seen by his cardiologist on 3/15 and found to be in fluid overload. Patient at that time was referred to ED for further evaluation and treatment.  Patient note during ED course was also noted to have history of black stools over the last few weeks on ROS after hgb was noted to be 7.8 down form prior 32f 8.6.  Patient at that juncture had fob completed which was noted to be heme positive with brown stools. Patient is transferred to [facility] due to lack of Gi specialty at [facility]. Currently patient states he has brown stools, he notes no current chest pain or sob at this time.   ED Course: Vitals:  EKG: afib , lacteral st -twave changes  Labs:  BNP 2297 was 247 prior Na:130 K 4.3,CL96, CO2 26,cr 1.17 Alkphos 295 InR 1.97 UA wbc>100, + bacteria Wbc 8.5, hgb 7.8,plt209  Cxr:1. Mild congestive heart failure.   Review of Systems: As per HPI otherwise 10 point review of systems negative.   Past Medical History:  Diagnosis Date   Aortic stenosis    Atrial flutter ([place])    a. s/p remote ablation by Dr. [name].   CAD (coronary artery disease)    a. s/p CABG 2003. b. 01/2016: unstable angina - s/p BMS to SVG-ramus intermediate, patent LIMA-mLAD, patent  RIMA-dRCA.    Chronic lower back pain    Essential hypertension    Hyperlipidemia    Ischemic cardiomyopathy    a. Cath 01/2016 -  EF 50-55% with mild distal inferior hypocontractility.   Peripheral neuropathy 04/04/2014   Permanent atrial fibrillation [facility])     Past Surgical History:  Procedure Laterality Date   BACK SURGERY     CARDIAC CATHETERIZATION  ~ 2000; 2003   CARDIAC CATHETERIZATION N/A 02/01/2016   Procedure: Left Heart Cath and Cors/Grafts Angiography;  Surgeon: [name], MD;  Location: [place] CV LAB;  Service: Cardiovascular;  Laterality: N/A;   CARDIAC CATHETERIZATION N/A 02/01/2016   Procedure: Coronary Stent Intervention;  Surgeon: [name], MD;  Location: [place] [facility] LAB;  Service: Cardiovascular;  Laterality: N/A;   CATARACT EXTRACTION W/PHACO Left 02/09/2019   Procedure: CATARACT EXTRACTION PHACO AND INTRAOCULAR LENS PLACEMENT (IOC);  Surgeon: [name], MD;  Location: [facility] OR[facility];  Service: Ophthalmology;  Laterality: Left;  CDE: 27.70   CATARACT EXTRACTION W/PHACO Right 02/23/2019   Procedure: CATARACT EXTRACTION PHACO AND INTRAOCULAR LENS PLACEMENT RIGHT EYE;  Surgeon: [name], MD;  Location: [facility] ORS;  Service: Ophthalmology;  Laterality: Right;  CDE: 10.15   COLONOSCOPY  06/04/2002   Normal colon and rectum   COLONOSCOPY  07/08/2012   Procedure: COLONOSCOPY;  Surgeon: [name], MD;  Location: [facility];  Service: Endoscopy;  Laterality: N/A;  11:30   COLONOSCOPY N/A 10/02/2017   Procedure: COLONOSCOPY;  Surgeon: [name], MD;  Location: [facility];  Service: Endoscopy;  Laterality: N/A;  10:30am   CORONARY ANGIOPLASTY WITH STENT PLACEMENT  02/01/2016   CORONARY ARTERY BYPASS GRAFT  2003   LIMA to LAD,LIMA to distal RCA,SVG to ramus intermediate vessel.   FEMORAL REVISION Right 03/30/2019   Procedure: Femoral revision right total hip arthroplasty;  Surgeon: [name], MD;  Location: [facility];  Service: Orthopedics;   Laterality: Right;  174min   FOREIGN BODY REMOVAL Right 04/02/2014   Procedure: FOREIGN BODY REMOVAL RIGHT FOOT;  Surgeon: [name], MD;  Location: [facility];  Service: General;  Laterality: Right;   JOINT REPLACEMENT     JOINT REPLACEMENT     LUMBAR [place] SURGERY     REVISION TOTAL HIP ARTHROPLASTY Bilateral 2005-2012   right-left   RIGHT/LEFT HEART CATH AND CORONARY/GRAFT ANGIOGRAPHY N/A 09/27/2022   Procedure: RIGHT/LEFT HEART CATH AND CORONARY/GRAFT ANGIOGRAPHY;  Surgeon: Martinique, Peter M, MD;  Location: [place] CV LAB;  Service: Cardiovascular;  Laterality: N/A;   SHOULDER OPEN ROTATOR CUFF REPAIR Right    TOTAL HIP ARTHROPLASTY Right 1999   TOTAL HIP ARTHROPLASTY Left 2008   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     reports that he quit smoking about 56 years ago. His smoking use included cigarettes. He has a 1.00 pack-year smoking history. He has never used smokeless tobacco. He reports current alcohol use of about 11.0 standard drinks of alcohol per week. He reports that he does not use drugs.  Allergies  Allergen Reactions   Augmentin [Amoxicillin-Pot Clavulanate] Nausea And Vomiting and Other (See Comments)    Has patient had a PCN reaction causing immediate rash, facial/tongue/throat swelling, SOB or lightheadedness with hypotension: Unknown Has patient had a PCN reaction causing severe rash involving mucus membranes or skin necrosis: No Has patient had a PCN reaction that required hospitalization: No Has patient had a PCN reaction occurring within the last 10 years: No If all of the above answers are "NO", then may proceed with Cephalosporin use.     Family History  Problem Relation Age of Onset   Heart attack Brother    Colon cancer Neg Hx     Prior to Admission medications    Medication Sig Start Date End Date Taking? Authorizing Provider  apixaban (ELIQUIS) 5 MG TABS tablet Take 1 tablet (5 mg total) by mouth 2 (two) times daily. 12/11/21 04/11/23  Satira Sark, MD  atorvastatin (LIPITOR) 80 MG tablet TAKE 1 TABLET BY MOUTH EVERY DAY 12/07/21   Satira Sark, MD  diltiazem (DILT-XR) 240 MG 24 hr capsule TAKE 1 CAPSULE BY MOUTH EVERY DAY 12/07/21   Satira Sark, MD  metoprolol succinate (TOPROL-XL) 100 MG 24 hr tablet TAKE 1 TABLET BY MOUTH EVERY DAY WITH OR IMMEDIATELY FOLLOWING A MEAL 05/08/22   Satira Sark, MD  nitroGLYCERIN (NITROSTAT) 0.4 MG SL tablet Place 1 tablet (0.4 mg total) under the tongue every 5 (five) minutes x 3 doses as needed for chest pain (if no relief after 3rd dose, proceed to the ED for an evaluation or call 911). 09/05/22   Satira Sark, MD  potassium chloride (KLOR-CON M) 10 MEQ tablet Take 3 tablets (30 mEq total) by mouth daily. 10/02/22 11/16/22  Donne Hazel, MD  spironolactone (ALDACTONE) 25 MG  tablet Take 0.5 tablets (12.5 mg total) by mouth daily. Patient not taking: Reported on 11/16/2022 10/02/22 11/01/22  Donne Hazel, MD  torsemide (DEMADEX) 20 MG tablet Take 80 mg (4 tablets) daily. If weight 211 lbs or greater, take extra 20 mg Lasix that day 10/17/22   Darrick Grinder D, NP    Physical Exam: Vitals:   11/18/22 1840 11/18/22 1959  BP: 117/65 110/61  Pulse: (!) 122 100  Resp: 18   Temp: (!) 97.4 F (36.3 C)   TempSrc: Oral   SpO2: 91% 100%  Weight: 99.3 kg   Height: 5\' 11"  (1.803 m)     Constitutional: NAD, calm, comfortable Vitals:   11/18/22 1840 11/18/22 1959  BP: 117/65 110/61  Pulse: (!) 122 100  Resp: 18   Temp: (!) 97.4 F (36.3 C)   TempSrc: Oral   SpO2: 91% 100%  Weight: 99.3 kg   Height: 5\' 11"  (1.803 m)    Eyes: PERRL, lids and conjunctivae normal ENMT: Mucous membranes are moist. Posterior pharynx clear of any exudate or lesions.Normal dentition.  Neck: normal, supple, no masses, no  thyromegaly Respiratory: clear to auscultation bilaterally, no wheezing, no crackles. Normal respiratory effort. No accessory muscle use.  Cardiovascular: Regular rate and rhythm, +murmurs / rubs / gallops. Trace extremity edema. 2+ pedal pulses.  Abdomen: no tenderness, no masses palpated. No hepatosplenomegaly. Bowel sounds positive.  Musculoskeletal: no clubbing / cyanosis. No joint deformity upper and lower extremities. Good ROM, no contractures. Normal muscle tone.  Skin: no rashes, lesions, ulcers. No induration Neurologic: CN 2-12 grossly intact. Sensation intact,. Strength 5/5 in all 4.  Psychiatric: Normal judgment and insight. Alert and oriented x 3. Normal mood.    Labs on Admission: I have personally reviewed following labs and imaging studies  CBC: No results for input(s): "WBC", "NEUTROABS", "HGB", "HCT", "MCV", "PLT" in the last 168 hours. Basic Metabolic Panel: No results for input(s): "NA", "K", "CL", "CO2", "GLUCOSE", "BUN", "CREATININE", "CALCIUM", "MG", "PHOS" in the last 168 hours. GFR: CrCl cannot be calculated ([name] most recent lab result is older than the maximum 21 days allowed.). Liver Function Tests: No results for input(s): "AST", "ALT", "ALKPHOS", "BILITOT", "PROT", "ALBUMIN" in the last 168 hours. No results for input(s): "LIPASE", "AMYLASE" in the last 168 hours. No results for input(s): "AMMONIA" in the last 168 hours. Coagulation Profile: No results for input(s): "INR", "PROTIME" in the last 168 hours. Cardiac Enzymes: No results for input(s): "CKTOTAL", "CKMB", "CKMBINDEX", "TROPONINI" in the last 168 hours. BNP (last 3 results) No results for input(s): "PROBNP" in the last 8760 hours. HbA1C: No results for input(s): "HGBA1C" in the last 72 hours. CBG: No results for input(s): "GLUCAP" in the last 168 hours. Lipid Profile: No results for input(s): "CHOL", "HDL", "LDLCALC", "TRIG", "CHOLHDL", "LDLDIRECT" in the last 72 hours. Thyroid Function  Tests: No results for input(s): "TSH", "T4TOTAL", "FREET4", "T3FREE", "THYROIDAB" in the last 72 hours. Anemia Panel: No results for input(s): "VITAMINB12", "FOLATE", "FERRITIN", "TIBC", "IRON", "RETICCTPCT" in the last 72 hours. Urine analysis:    Component Value Date/Time   COLORURINE YELLOW 04/09/2019 [place] 04/09/2019 1105   LABSPEC 1.011 04/09/2019 1105   PHURINE 5.0 04/09/2019 1105   GLUCOSEU NEGATIVE 04/09/2019 1105   [place] 04/09/2019 1105   [place] 04/09/2019 1105   [place] 04/09/2019 1105   PROTEINUR NEGATIVE 04/09/2019 1105   UROBILINOGEN 0.2 11/28/2010 0915   NITRITE NEGATIVE 04/09/2019 1105   LEUKOCYTESUR NEGATIVE 04/09/2019 1105  Radiological Exams on Admission: No results found.  EKG: Independently reviewed.   Assessment/Plan  Acute on Chronic Pef Exacerbation  -lasix  iv bid  -cycle ce, check tsh, echo in am  -ekg without hyperacute st -twave changes  -continue metoprolol -cardiology consult   CAD s/p CABG now with Chest pain nos -EKG with out hyperacute findings  -cycle ce  -echo in am  - no current chest pain currently  -cardiology to leave final recs -resume  GDMT as able once med rec complete   GI bleed with symptomatic anemia - s/p 1 unit prbc in Ed - monitor h/h  transfuse < 8  - ppi bid  -gi consult for further evaluation  - hold elqiuis   Atrial fibrillation  - hold eliquis -continue diltiazem  and metoprolol   HLD -continue atorvastatin     UTI -CTX DVT prophylaxis:  scd  Code Status: full/ as discussed per patient wishes in event of cardiac arrest  Family Communication: none at bedside Disposition Plan:patient  expected to be admitted greater than 2 midnights  Consults called: GI , Cardiology  Admission status: progressive   Clance Boll MD [facility]   If 7PM-7AM, please contact night-coverage [email or web address] [facility]  11/18/2022, 8:17 PM

## 2022-11-19 ENCOUNTER — Inpatient Hospital Stay (HOSPITAL_COMMUNITY): Payer: Medicare HMO

## 2022-11-19 ENCOUNTER — Encounter (HOSPITAL_COMMUNITY): Payer: Medicare HMO

## 2022-11-19 DIAGNOSIS — I25810 Atherosclerosis of coronary artery bypass graft(s) without angina pectoris: Secondary | ICD-10-CM

## 2022-11-19 DIAGNOSIS — I739 Peripheral vascular disease, unspecified: Secondary | ICD-10-CM

## 2022-11-19 DIAGNOSIS — I5033 Acute on chronic diastolic (congestive) heart failure: Secondary | ICD-10-CM | POA: Diagnosis not present

## 2022-11-19 DIAGNOSIS — E871 Hypo-osmolality and hyponatremia: Secondary | ICD-10-CM

## 2022-11-19 DIAGNOSIS — D539 Nutritional anemia, unspecified: Secondary | ICD-10-CM

## 2022-11-19 DIAGNOSIS — R7989 Other specified abnormal findings of blood chemistry: Secondary | ICD-10-CM

## 2022-11-19 DIAGNOSIS — R0602 Shortness of breath: Secondary | ICD-10-CM

## 2022-11-19 DIAGNOSIS — I4821 Permanent atrial fibrillation: Secondary | ICD-10-CM

## 2022-11-19 DIAGNOSIS — I350 Nonrheumatic aortic (valve) stenosis: Secondary | ICD-10-CM

## 2022-11-19 DIAGNOSIS — I255 Ischemic cardiomyopathy: Secondary | ICD-10-CM

## 2022-11-19 DIAGNOSIS — Z951 Presence of aortocoronary bypass graft: Secondary | ICD-10-CM | POA: Diagnosis not present

## 2022-11-19 DIAGNOSIS — I10 Essential (primary) hypertension: Secondary | ICD-10-CM | POA: Diagnosis not present

## 2022-11-19 DIAGNOSIS — I4891 Unspecified atrial fibrillation: Secondary | ICD-10-CM

## 2022-11-19 DIAGNOSIS — Z8679 Personal history of other diseases of the circulatory system: Secondary | ICD-10-CM

## 2022-11-19 DIAGNOSIS — K8020 Calculus of gallbladder without cholecystitis without obstruction: Secondary | ICD-10-CM

## 2022-11-19 DIAGNOSIS — D6869 Other thrombophilia: Secondary | ICD-10-CM

## 2022-11-19 DIAGNOSIS — E669 Obesity, unspecified: Secondary | ICD-10-CM

## 2022-11-19 DIAGNOSIS — F1020 Alcohol dependence, uncomplicated: Secondary | ICD-10-CM

## 2022-11-19 DIAGNOSIS — K921 Melena: Secondary | ICD-10-CM | POA: Diagnosis not present

## 2022-11-19 LAB — IRON AND TIBC
Iron: 76 ug/dL (ref 45–182)
Saturation Ratios: 28 % (ref 17.9–39.5)
TIBC: 276 ug/dL (ref 250–450)
UIBC: 200 ug/dL

## 2022-11-19 LAB — FERRITIN: Ferritin: 304 ng/mL (ref 24–336)

## 2022-11-19 LAB — HEMOGLOBIN AND HEMATOCRIT, BLOOD
HCT: 25.3 % — ABNORMAL LOW (ref 39.0–52.0)
HCT: 24.5 % — ABNORMAL LOW (ref 39.0–52.0)
Hemoglobin: 8.1 g/dL — ABNORMAL LOW (ref 13.0–17.0)
Hemoglobin: 8 g/dL — ABNORMAL LOW (ref 13.0–17.0)

## 2022-11-19 LAB — URINALYSIS, ROUTINE W REFLEX MICROSCOPIC
Bilirubin Urine: NEGATIVE
Glucose, UA: NEGATIVE mg/dL
Ketones, ur: NEGATIVE mg/dL
Nitrite: NEGATIVE
Protein, ur: NEGATIVE mg/dL
Specific Gravity, Urine: 1.006 (ref 1.005–1.030)
WBC, UA: >50 WBC/hpf (ref 0–5)
pH: 6 (ref 5.0–8.0)

## 2022-11-19 LAB — COMPREHENSIVE METABOLIC PANEL
ALT: 25 U/L (ref 0–44)
AST: 24 U/L (ref 15–41)
Albumin: 2.9 g/dL — ABNORMAL LOW (ref 3.5–5.0)
Alkaline Phosphatase: 217 U/L — ABNORMAL HIGH (ref 38–126)
Anion gap: 6 (ref 5–15)
BUN: 13 mg/dL (ref 8–23)
CO2: 26 mmol/L (ref 22–32)
Calcium: 8.5 mg/dL — ABNORMAL LOW (ref 8.9–10.3)
Chloride: 99 mmol/L (ref 98–111)
Creatinine, Ser: 0.77 mg/dL (ref 0.61–1.24)
GFR, Estimated: >60 mL/min (ref >60)
Glucose, Bld: 88 mg/dL (ref 70–99)
Potassium: 4.2 mmol/L (ref 3.5–5.1)
Sodium: 131 mmol/L — ABNORMAL LOW (ref 135–145)
Total Bilirubin: 2.7 mg/dL — ABNORMAL HIGH (ref 0.3–1.2)
Total Protein: 5.6 g/dL — ABNORMAL LOW (ref 6.5–8.1)

## 2022-11-19 LAB — CBC
HCT: 22.8 % — ABNORMAL LOW (ref 39.0–52.0)
Hemoglobin: 7.1 g/dL — ABNORMAL LOW (ref 13.0–17.0)
MCH: 36.2 pg — ABNORMAL HIGH (ref 26.0–34.0)
MCHC: 31.1 g/dL (ref 30.0–36.0)
MCV: 116.3 fL — ABNORMAL HIGH (ref 80.0–100.0)
Platelets: 171 10*3/uL (ref 150–400)
RBC: 1.96 MIL/uL — ABNORMAL LOW (ref 4.22–5.81)
RDW: 25 % — ABNORMAL HIGH (ref 11.5–15.5)
WBC: 7.3 10*3/uL (ref 4.0–10.5)
nRBC: 0.3 % — ABNORMAL HIGH (ref 0.0–0.2)

## 2022-11-19 LAB — RETICULOCYTES
Immature Retic Fract: 17.2 % — ABNORMAL HIGH (ref 2.3–15.9)
RBC.: 1.93 MIL/uL — ABNORMAL LOW (ref 4.22–5.81)
Retic Count, Absolute: 66.6 10*3/uL (ref 19.0–186.0)
Retic Ct Pct: 3.5 % — ABNORMAL HIGH (ref 0.4–3.1)

## 2022-11-19 LAB — PREPARE RBC (CROSSMATCH)

## 2022-11-19 LAB — FOLATE: Folate: 8.8 ng/mL (ref >5.9)

## 2022-11-19 LAB — TROPONIN I (HIGH SENSITIVITY)
Troponin I (High Sensitivity): 34 ng/L — ABNORMAL HIGH (ref <18)
Troponin I (High Sensitivity): 36 ng/L — ABNORMAL HIGH (ref <18)

## 2022-11-19 LAB — BRAIN NATRIURETIC PEPTIDE: B Natriuretic Peptide: 634.9 pg/mL — ABNORMAL HIGH (ref 0.0–100.0)

## 2022-11-19 LAB — VITAMIN B12: Vitamin B-12: 545 pg/mL (ref 180–914)

## 2022-11-19 MED ORDER — FUROSEMIDE 10 MG/ML IJ SOLN
40.0000 mg | Freq: Two times a day (BID) | INTRAMUSCULAR | Status: DC
  Administered 2022-11-19 – 2022-11-20 (×3): 40 mg via INTRAVENOUS
  Filled 2022-11-19 (×4): qty 4

## 2022-11-19 NOTE — Evaluation (Signed)
Occupational Therapy Evaluation Patient Details Name: Leonard Cisneros MRN: [id number] DOB: [birth year] Today's Date: 11/19/2022   History of Present Illness 82 y.o. 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 Pt presented to the [facility] ER for several weeks of SOB and transferred to [facility] for further work-up.   Clinical Impression   Pt admitted for above diagnosis. Pt lives with spouse in a one level home, 1 step to get in but 14 steps at the back porch, walkin shower and regular toilet no grab bars. Pt reports being independent in bADLs and IADLs PTA, ambulating with mainly his crutches or two canes as he prefers that over the RW, he was also driving.  Pt reports spouse is able to provide 24/7 support during day and only works 2hrs at night. Pt currently completing bed mobility with close supervision, sit>stands with Min A from the bed to power up into standing position, and Min guard assist when ambulating in the hallway. Pt displayed no SOB when walking but gait pattern was impaired likely due to using crutches to walk but Pt displayed no interest in using RW instead. Pt to continue with skilled acute OT services to address above deficits and help transition to next level of care. Pt would benefit from post acute home OT services to maximize functional independence in natural environment.     Recommendations for follow up therapy are one component of a multi-disciplinary discharge planning process, led by the attending physician.  Recommendations may be updated based on patient status, additional functional criteria and insurance authorization.   Follow Up Recommendations  Home health OT     Assistance Recommended at Discharge  Intermittent Supervision/Assistance  Patient can return home with the following A little help with walking and/or transfers;A lot of help with bathing/dressing/bathroom;Assistance with cooking/housework;Assist for transportation;Help with stairs or ramp for entrance    Functional Status Assessment  Patient has had a recent decline in their functional status and demonstrates the ability to make significant improvements in function in a reasonable and predictable amount of time.  Equipment Recommendations  Tub/shower seat (will determine need for [facility] after more therapy sessions)   Recommendations for Other Services       Precautions / Restrictions Precautions Precautions: Fall Restrictions Weight Bearing Restrictions: No      Mobility Bed Mobility Overal bed mobility: Needs Assistance Bed Mobility: Supine to Sit, Sit to Supine     Supine to sit: Supervision Sit to supine: Supervision        Transfers Overall transfer level: Needs assistance Equipment used: Crutches Transfers: Sit to/from Stand Sit to Stand: Min assist           General transfer comment: Light Min A to power up to standing position      Balance Overall balance assessment: Needs assistance Sitting-balance support: Feet supported, No upper extremity supported Sitting balance-Leahy Scale: Good     Standing balance support: Reliant on assistive device for balance, Bilateral upper extremity supported, During functional activity Standing balance-Leahy Scale: Poor Standing balance comment: Reliant on crutches, needs asisstance to maintain standing balance if UEs were unsupported  ADL either performed or assessed with clinical judgement   ADL Overall ADL's : Needs assistance/impaired Eating/Feeding: Independent   Grooming: Standing;Min guard Grooming Details (indicate cue type and reason): Needs UE supported to maintain balance Upper Body Bathing: Sitting;Min  guard   Lower Body Bathing: Minimal assistance;Moderate assistance;Sitting/lateral leans   Upper Body Dressing : Supervision/safety;Sitting   Lower Body Dressing: Moderate assistance;Sitting/lateral leans;Sit to/from stand   Toilet Transfer: Minimal assistance;Ambulation   Toileting- Clothing Manipulation and Hygiene: Minimal assistance Toileting - Clothing Manipulation Details (indicate cue type and reason): Min A to maintain balance while leaving UEs unsupported Tub/ Shower Transfer: Minimal assistance;Ambulation   Functional mobility during ADLs: Min guard (with crutches) General ADL Comments: Pt prefers to ambulate with crutches as opposed to RW, ambulates with Min guard assist when using crutches but gait pattern seems visually impaired due to walking with crutches     Vision   Vision Assessment?: No apparent visual deficits     Perception     Praxis      Pertinent Vitals/Pain Pain Assessment Pain Assessment: No/denies pain     Hand Dominance     Extremity/Trunk Assessment Upper Extremity Assessment Upper Extremity Assessment: Overall WFL for tasks assessed   Lower Extremity Assessment Lower Extremity Assessment: LLE deficits/detail;RLE deficits/detail;Generalized weakness RLE Deficits / Details: BLE edema LLE Deficits / Details: BLE edema       Communication Communication Communication: HOH   Cognition Arousal/Alertness: Awake/alert Behavior During Therapy: WFL for tasks assessed/performed Overall Cognitive Status: Difficult to assess                                       General Comments  Pt Afib, HR spiked to 127 during functional ambulation to bed. Pt O2 levels ranging from 90%-93% during therapy but difficult to assess true value due to inaccurate Sp02 waveform. Pt denies any feelings of SOB, dizziness, and feeling lightheaded    Exercises     Shoulder Instructions      Home Living Family/patient expects to be discharged to::  Private residence Living Arrangements: Spouse/significant other Available Help at Discharge: Family;Available 24 hours/day Type of Home: House Home Access: Stairs to enter [employer] of Steps: 1 (14 stairs in back of home)   Home Layout: One level     Bathroom Shower/Tub: Occupational psychologist: Standard     Home Equipment: Cane - single Barista (2 wheels);Crutches   Additional Comments: Pt reports he prefers to ambulate with cructches      Prior Functioning/Environment Prior Level of Function : Independent/Modified Independent             Mobility Comments: uses 2 canes vs crutches to abmulate, reports driving PTA ADLs Comments: Pt reports being independent in bADLs and IADLs        OT Problem List: Impaired balance (sitting and/or standing);Decreased strength      OT Treatment/Interventions: Self-care/ADL training;Balance training;Therapeutic exercise;Therapeutic activities;Patient/family education;DME and/or AE instruction    OT Goals(Current goals can be found in the care plan section) Acute Rehab OT Goals Patient Stated Goal: Get procedure done then go home OT Goal Formulation: With patient Time For Goal Achievement: 12/03/22 Potential to Achieve Goals: Fair ADL Goals Pt Will Perform Grooming: (P) standing;with supervision (UE supported on sink or RW) Pt Will Perform Lower Body Dressing: (P) with min assist;sitting/lateral leans Pt Will Transfer to Toilet: (P) with supervision;ambulating;regular height toilet (  ambulating with Least Restrictive Adaptive Device)  OT Frequency: Min 2X/week    Co-evaluation              AM-PAC OT "6 Clicks" Daily Activity     Outcome Measure Help from another person eating meals?: None Help from another person taking care of personal grooming?: None Help from another person toileting, which includes using toliet, bedpan, or urinal?: A Little Help from another person bathing (including  washing, rinsing, drying)?: A Lot Help from another person to put on and taking off regular upper body clothing?: None Help from another person to put on and taking off regular lower body clothing?: A Lot 6 Click Score: 19   End of Session Equipment Utilized During Treatment: Gait belt;Other (comment) (Crutches) Nurse Communication: Mobility status  Activity Tolerance: Patient tolerated treatment well Patient left: with call bell/phone within reach;in bed  OT Visit Diagnosis: Unsteadiness on feet (R26.81);Muscle weakness (generalized) (M62.81)                Time: [id number] OT Time Calculation (min): 24 min Charges:  OT General Charges $OT Visit: 1 Visit OT Evaluation $OT Eval Moderate Complexity: 1 Mod OT Treatments $Therapeutic Activity: 8-22 mins  11/19/2022  AB, OTR/L  Acute Rehabilitation Services  Office: [place] 11/19/2022, 5:35 PM

## 2022-11-19 NOTE — TOC Progression Note (Signed)
Transition of Care [facility]) - Progression Note    Patient Details  Name: Leonard Cisneros MRN: [id number] Date of Birth: November 22, 1940  Transition of Care [facility]) CM/SW Contact  Zenon Mayo, RN Phone Number: 11/19/2022, 2:57 PM  Clinical Narrative:     Transition of Care [facility]) Screening Note   Patient Details  Name: [name] Date of Birth: Apr 28, 1941   Transition of Care [facility]) CM/SW Contact:    Zenon Mayo, RN Phone Number: 11/19/2022, 2:58 PM    Transition of Care Department [facility]) has reviewed patient and no TOC needs have been identified at this time. We will continue to monitor patient advancement through interdisciplinary progression rounds. If new patient transition needs arise, please place a TOC consult.from home with wife, SOB , poor appetite, fluid build up, black stool, heme positive, NPO, GI to see, on 2 liters.         Expected Discharge Plan and Services                                               Social Determinants of Health (SDOH) Interventions SDOH Screenings   Food Insecurity: No Food Insecurity (09/24/2022)  Housing: Low Risk  (09/28/2022)  Transportation Needs: No Transportation Needs (09/24/2022)  Utilities: Not At Risk (09/24/2022)  Alcohol Screen: Low Risk  (09/28/2022)  Depression (PHQ2-9): Low Risk  (10/05/2022)  Financial Resource Strain: Low Risk  (09/28/2022)  Social Connections: Unknown (04/21/2018)  Stress: Stress Concern Present (04/21/2018)  Tobacco Use: Medium Risk (11/16/2022)    Readmission Risk Interventions    09/25/2022   12:42 PM  Readmission Risk Prevention Plan  Transportation Screening Complete  PCP or Specialist Appt within 5-7 Days Not Complete  Home Care Screening Complete  Medication Review (RN CM) Complete

## 2022-11-19 NOTE — Progress Notes (Signed)
PROGRESS NOTE  Leonard Cisneros [id number] DOB: [birth year]   PCP: Alvira Monday, FNP  Patient is from: Home.  Lives with his wife.  Uses crutches at baseline.  DOA: 11/18/2022 LOS: 1  Chief complaints Shortness of breath, weight gain and edema    Brief Narrative / Interim history: 82 year old M with PMH of CAD/CABG with recent LHC showing 3v-CAD, moderate aortic stenosis, ICM, diastolic CHF, lower back pain, A-flutter s/p ablation, permanent A-fib on Eliquis, PAD, anemia and HTN presented to [facility] with shortness of breath, weight gain, edema and melena, and transferred to [facility] for GI evaluation.  Hgb was 7.8 (down from 8.6).  He was started on IV Lasix for CHF exacerbation.   On arrival here, in mild RVR to 122.  91% on RA.  Hgb 7.5.  MSP 115.  INR 1.3.  CMP is without significant finding other than mild hyponatremia.  BNP 780.  TSH normal.  Troponin 36> 37.  EKG with rate controlled A-fib.  UA with pyuria and rare bacteria.  1 unit of blood ordered.  Started on PPI, Lasix and ceftriaxone.  Cardiology and GI consulted.   Subjective: Seen and examined earlier this morning.  No major events overnight of this morning.  Currently no complaints.  He denies shortness of breath, chest pain, palpitation, dizziness, GI or UTI symptoms.  He reports dark stool for about 2 to 3 weeks.  Last dose of Eliquis on Friday.  Denies NSAID use.  Objective: Vitals:   11/19/22 0605 11/19/22 0719 11/19/22 0741 11/19/22 0800  BP: (!) 107/51 105/60 (!) 106/49   Pulse: 97 94 94   Resp: 18 17 18    Temp: 98.1 F (36.7 C) 98 F (36.7 C) 98.1 F (36.7 C)   TempSrc: Oral Oral Oral   SpO2: 96% 96% 98% 97%  Weight:      Height:        Examination:  GENERAL: No apparent distress.  Nontoxic. HEENT: MMM.  Vision and hearing grossly intact.  NECK: Supple.  No apparent JVD.  RESP:  No IWOB.  Fair aeration bilaterally. [name]: Irregular rhythm.  Normal rate.  2/6 SEM over RUSB and LUSB. ABD/GI/GU: BS+. Abd  soft, NTND.  MSK/EXT:  Moves extremities.  Difficult to assess DVT due to stasis dermatitis.  Not able to palpate DP pulses. SKIN: Stasis dermatitis on BLE. NEURO: Awake, alert and oriented appropriately.  No apparent focal neuro deficit. PSYCH: Calm. Normal affect.   Procedures:  None  Microbiology summarized: None  Assessment and plan: Principal Problem:   SOB (shortness of breath) Active Problems:   Hx of CABG   Acute on chronic diastolic congestive heart failure (HCC)   Essential hypertension   PAD (peripheral artery disease) (HCC)   Macrocytic anemia   Obesity (BMI 30-39.9)   Ischemic cardiomyopathy   Atrial fibrillation with RVR (HCC)   Hyponatremia   Acquired thrombophilia (HCC)   History of CAD (coronary artery disease)   Elevated troponin  Acute on chronic diastolic CHF/ICM: Presented to [facility] with progressive SOB, weight gain, edema and melena.  BNP elevated to 780.  CXR with mild vascular congestion and bilateral pleural effusions.  Could be due to anemia.  Difficult to assess edema due to underlying PAD/stasis dermatitis.  Reports improvement in his breathing.  Started on IV Lasix.  Had 1.6 L UOP overnight. Cr stable. -Cardiology consulted-on IV Lasix 40 mg twice daily -Manage anemia as below -Strict intake and output, daily weights, renal functions and electrolytes.   Symptomatic  blood loss anemia: Baseline seems to be between 8 and 9.  Patient reports melena for 2 to 3 weeks.  On Eliquis for A-fib.  Hemoccult positive at [facility].  Anemia panel normal.  One unit ordered but delay in transfusion due to antibody. Recent Labs    09/27/22 1539 09/27/22 1546 09/27/22 1748 09/28/22 0121 09/28/22 1831 09/29/22 0954 10/05/22 0959 11/18/22 2113 11/19/22 0021 11/19/22 1045  HGB 7.1* 7.5*  7.5* 7.6* 7.4* 8.6* 8.5* 8.5* 7.5* 7.1* 8.1*  -GI consulted -Continue holding Eliquis.  Last dose reportedly on Friday -Continue PPI -Monitor H&H.  Goal Hgb >  8.0 given  CAD  Moderate to severe aortic stenosis: Noted on TTE in 09/2022. -Per cardiology. -Followed every 6 months by cardiology.  Elevated troponin/history of CAD/CABG/3v-CAD: Denies chest pain.  Elevated troponin likely demand ischemia. -Cardiology on board -Continue Lipitor -Resume metoprolol as BP allows.  Permanent atrial fibrillation with RVR: On Toprol-XL, Cardizem and Eliquis at home.  RVR improved.  Soft blood pressures. -Holding Eliquis in the setting of melena -Will resume metoprolol as blood pressure allows. -Optimize electrolytes  Essential hypertension: Soft blood pressures. -Continue holding home meds  PAD: No recent ABI.  Not able to palpate DP pulses. -Check ABI -Continue statin  Ambulatory dysfunction/chronic lower back pain: Uses crutches for ambulation -PT/OT eval  Obesity Body mass index is 30.53 kg/m.           DVT prophylaxis:  SCDs Start: 11/18/22 2020  Code Status: Full code Family Communication: None at bedside Level of care: Progressive Status is: Inpatient Remains inpatient appropriate because: Symptomatic anemia, CHF exacerbation   Final disposition: TBD Consultants:  Cardiology Gastroenterology  55 minutes with more than 50% spent in reviewing records, counseling patient/family and coordinating care.   Sch Meds:  Scheduled Meds:  sodium chloride   Intravenous Once   furosemide  40 mg Intravenous BID   pantoprazole (PROTONIX) IV  40 mg Intravenous Q12H   Continuous Infusions:   PRN Meds:.acetaminophen **OR** acetaminophen, levalbuterol, ondansetron **OR** ondansetron (ZOFRAN) IV  Antimicrobials: Anti-infectives (From admission, onward)    Start     Dose/Rate Route Frequency Ordered Stop   11/19/22 2200  cefTRIAXone (ROCEPHIN) 1 g in sodium chloride 0.9 % 100 mL IVPB  Status:  Discontinued        1 g 200 mL/hr over 30 Minutes Intravenous Every 24 hours 11/18/22 2256 11/19/22 1240   11/19/22 0000  cefTRIAXone (ROCEPHIN) 1 g in  sodium chloride 0.9 % 100 mL IVPB        1 g 200 mL/hr over 30 Minutes Intravenous  Once 11/18/22 2301 11/19/22 0053        I have personally reviewed the following labs and images: CBC: Recent Labs  Lab 11/18/22 2113 11/19/22 0021 11/19/22 1045  WBC 7.5 7.3  --   NEUTROABS 5.5  --   --   HGB 7.5* 7.1* 8.1*  HCT 23.2* 22.8* 25.3*  MCV 114.9* 116.3*  --   PLT 164 171  --    BMP &GFR Recent Labs  Lab 11/18/22 2113 11/19/22 0021  NA 132* 131*  K 4.2 4.2  CL 97* 99  CO2 26 26  GLUCOSE 97 88  BUN 15 13  CREATININE 0.78 0.77  CALCIUM 8.7* 8.5*  MG 2.3  --   PHOS 3.6  --    Estimated Creatinine Clearance: 87 mL/min (by C-G formula based on SCr of 0.77 mg/dL). Liver & Pancreas: Recent Labs  Lab 11/18/22 2113 11/19/22 0021  AST 26 24  ALT 26 25  ALKPHOS 198* 217*  BILITOT 3.0* 2.7*  PROT 5.7* 5.6*  ALBUMIN 3.0* 2.9*   No results for input(s): "LIPASE", "AMYLASE" in the last 168 hours. No results for input(s): "AMMONIA" in the last 168 hours. Diabetic: No results for input(s): "HGBA1C" in the last 72 hours. No results for input(s): "GLUCAP" in the last 168 hours. Cardiac Enzymes: No results for input(s): "CKTOTAL", "CKMB", "CKMBINDEX", "TROPONINI" in the last 168 hours. No results for input(s): "PROBNP" in the last 8760 hours. Coagulation Profile: Recent Labs  Lab 11/18/22 2113  INR 1.3*   Thyroid Function Tests: Recent Labs    11/18/22 2113  TSH 2.685   Lipid Profile: No results for input(s): "CHOL", "HDL", "LDLCALC", "TRIG", "CHOLHDL", "LDLDIRECT" in the last 72 hours. Anemia Panel: Recent Labs    11/19/22 0021  VITAMINB12 545  FOLATE 8.8  FERRITIN 304  TIBC 276  IRON 76  RETICCTPCT 3.5*   Urine analysis:    Component Value Date/Time   COLORURINE YELLOW 11/19/2022 0115   APPEARANCEUR CLEAR 11/19/2022 0115   LABSPEC 1.006 11/19/2022 0115   PHURINE 6.0 11/19/2022 0115   GLUCOSEU NEGATIVE 11/19/2022 0115   HGBUR SMALL (A) 11/19/2022  0115   BILIRUBINUR NEGATIVE 11/19/2022 0115   KETONESUR NEGATIVE 11/19/2022 0115   PROTEINUR NEGATIVE 11/19/2022 0115   UROBILINOGEN 0.2 11/28/2010 0915   NITRITE NEGATIVE 11/19/2022 0115   LEUKOCYTESUR LARGE (A) 11/19/2022 0115   Sepsis Labs: Invalid input(s): "PROCALCITONIN", "LACTICIDVEN"  Microbiology: No results found for this or any previous visit (from the past 240 hour(s)).  Radiology Studies: DG Chest 2 View  Result Date: 11/19/2022 CLINICAL DATA:  Congestive failure EXAM: CHEST - 2 VIEW COMPARISON:  09/25/2022 FINDINGS: Heart is again enlarged in size. Postsurgical changes are again seen. Pleural effusions are noted right considerably greater than left. Basilar atelectasis is noted right greater than left. Mild central vascular congestion is seen without significant edema. IMPRESSION: Mild vascular congestion without significant edema. Bilateral pleural effusions and associated atelectatic changes. Electronically Signed   By: Inez Catalina M.D.   On: 11/19/2022 11:34      Khalia Gong T. [place]  If 7PM-7AM, please contact night-coverage [email or web address] 11/19/2022, 12:53 PM

## 2022-11-19 NOTE — Consult Note (Addendum)
Cardiology Consultation   Patient ID: Leonard Cisneros MRN: [id number]; DOB: August 04, 1941  Admit date: 11/18/2022 Date of Consult: 11/19/2022  PCP:  Alvira Monday, [place] [facility] Cardiologist:  Rozann Lesches, MD        Patient Profile:   Leonard Cisneros is a 82 y.o. male with a hx of typical atrial flutter s/p ablation 12/2005,  permanent A fib, CAD s/p CABG in 2003 and BMS to SVG to ramus 2017, Ischemic cardiomyopathy with recovered EF, diastolic heart failue, HTN, HLD, Moderate aortic stenosis, PAD, who is being seen 11/19/2022 for the evaluation of CHF at the request of Dr. Cyndia Skeeters  History of Present Illness:   Mr. Leonard Cisneros with above PMH presented as a transfer from [facility] ER for further evaluation of CHF and GI bleed. He went to [facility] ER for several weeks of SOB. He has noted progressive weight gain and increased leg swelling as well. He has been taking torsemide 80mg  daily, but symptoms are not improving. He was seen by Ms Arlington Calix at the cardiology office on 11/16/22, advised go to the ER for urgent diuresis. He also had reported few weeks of black stools at home. Hgb was noted to be 7.8. He was transferred to [facility] due to lacking GI specialty at [facility].   Upon encounter, he is resting comfortably in bed. He is not a good historian. He could not elaborate well in history. He states his legs are always swollen, not really worsened with swelling. He states he is SOB with resting or exertional activity. He had no chest pain recently. He denied any dizziness. He states he had 3-4 weeks black stool. He still drinks 2 beers and 1 glass wine daily, denied withdrawal symptoms.  He states he is urinating well, is taking torsemide 80mg  daily at home. He became irritated when discussion involves EGD for GI bleed workup and states he rather hears from his doctor, encounter is ended at this time.   Per chart review, patient follows Dr Domenic Polite outpatient, has CAD,  underwent CABG in 2003 with LIMA-LAD, SVG-OM, RIMA-RCA, BMS to SVG with LIMA-OM LAD and RIMA-dRCA. He suffered ischemic CM, LVEF was 45-50% 2013, this has recovered with LVEF 55-60% on Echo 2020. He was admitted for recurrent angina 2017 and underwent LHC, received BMS to SVG-ramus intermediate, otherwise patent LIMA-mLAD, patent RIMA-dRCA. He has recurrent chest pain in 2020 required hospitalization, cardiac enzymes and EKG were negative, NM stress myoview 10/23/2018 was low risk and normal. LVEF was 55-60%. He was given IV lasix for CHF and discharged on 40mg  daily. He had chest pain again 11/2021 where he was hospitalized, NM stress myoview 11/12/21 showed no ischemia and low risk. He was felt in acute CHF, diuresed with lasix with good effect.   He was last hospitalized on 09/2022 for CHF and recurrent chest discomfort, was on torsemide 20mg  QID at home, given IV Lasix 40mg  BID.  Echo 09/25/22 showed LVEF 55-60%, no RWMA, nomral RV, severe LAE, mild RAE, mild MR, Moderate to severe aortic valve stenosis. Aortic valve mean gradient measures 24.0 mmHg. Dimentionless index 0.34. Cardiac enzymes were negative. R/L heart cath was done 09/27/22 showed three-vessel CAD with patent LIMA to the LAD, patent RIMA to the PDA, chronically occluded SVG to the ramus, widely patent native left circumflex, mild to moderate aortic stenosis with AV mean gradient 18.5 mm Hg. AVA 2.5 cm squared with index 1.15. Elevated LV filling pressures. LVEDP 20 mm Hg. Mean PCWP  24 mm Hg with large V waves to 42 mm Hg. Mild pulmonary HTN. PAP 56/17 with mean 31 mm Hg. He was recommended medical therapy for CAD, diuresis for CHF, and surveillance for AS. He was sent home on torsemide 80mg  daily. He followed up with advanced heart failure clinic 10/17/22 and saw Amy Clegg NP-C, REDS was 38%, advised to take torsemide 80 mg daily and an extra 20 mg torsemide for weight 211 ibs or greater. He was maintained on toprol XL, spironolactone 12.5 mg daily,  farxiga 10 mg daily. He was reported ETOH abuse, but not willing to quit. He was last seen by Ms Arlington Calix NP-C on 11/16/22 in the office, felt in CHF with worsening SOB and swelling and fatigue for a month, advised going to to ER for urgent evaluation.   Further more, he has long standing history of permanent A fib, hospitalized in 04/2019 for A fib RVR, felt due to acute CHF requiring IV diuresis. He was maintained on Cardizem and metoprolol for rate control, and coumadin for anticoagulation. He was switched to Eliquis 11/2021.   He saw Dr Fletcher Anon 05/2019 for PAD felt to have small vessel disease mainly below the knee with significant calcifications. He was recommended medical therapy for PAD.   Admission diagnostic here showed Na 132, Cr 0.78, [place] phos 198, albumin 3, total bilirubin 3. BNP 779. Hgb 7.5 >7.1 . INR 1.3. TSH WNL. B12 and iron panel unremarkable. Hs trop  34 >36. GI was consulted, recommend EGD and hold Eliquis. Cardiology is consulted for CHF.      Past Medical History:  Diagnosis Date   Aortic stenosis    Atrial flutter ([place])    a. s/p remote ablation by Dr. [name].   CAD (coronary artery disease)    a. s/p CABG 2003. b. 01/2016: unstable angina - s/p BMS to SVG-ramus intermediate, patent LIMA-mLAD, patent RIMA-dRCA.    Chronic lower back pain    Essential hypertension    Hyperlipidemia    Ischemic cardiomyopathy    a. Cath 01/2016 -  EF 50-55% with mild distal inferior hypocontractility.   Peripheral neuropathy 04/04/2014   Permanent atrial fibrillation [facility])     Past Surgical History:  Procedure Laterality Date   BACK SURGERY     CARDIAC CATHETERIZATION  ~ 2000; 2003   CARDIAC CATHETERIZATION N/A 02/01/2016   Procedure: Left Heart Cath and Cors/Grafts Angiography;  Surgeon: [name], MD;  Location: [place] CV LAB;  Service: Cardiovascular;  Laterality: N/A;   CARDIAC CATHETERIZATION N/A 02/01/2016   Procedure: Coronary Stent Intervention;  Surgeon: [name],  MD;  Location: [place] CV LAB;  Service: Cardiovascular;  Laterality: N/A;   CATARACT EXTRACTION W/PHACO Left 02/09/2019   Procedure: CATARACT EXTRACTION PHACO AND INTRAOCULAR LENS PLACEMENT (IOC);  Surgeon: [name], MD;  Location: [facility];  Service: Ophthalmology;  Laterality: Left;  CDE: 27.70   CATARACT EXTRACTION W/PHACO Right 02/23/2019   Procedure: CATARACT EXTRACTION PHACO AND INTRAOCULAR LENS PLACEMENT RIGHT EYE;  Surgeon: [name], MD;  Location: [facility];  Service: Ophthalmology;  Laterality: Right;  CDE: 10.15   COLONOSCOPY  06/04/2002   Normal colon and rectum   COLONOSCOPY  07/08/2012   Procedure: COLONOSCOPY;  Surgeon: [name], MD;  Location: [facility];  Service: Endoscopy;  Laterality: N/A;  11:30   COLONOSCOPY N/A 10/02/2017   Procedure: COLONOSCOPY;  Surgeon: [name], MD;  Location: [facility];  Service: Endoscopy;  Laterality: N/A;  10:30am  CORONARY ANGIOPLASTY WITH STENT PLACEMENT  02/01/2016   CORONARY ARTERY BYPASS GRAFT  2003   LIMA to LAD,LIMA to distal RCA,SVG to ramus intermediate vessel.   FEMORAL REVISION Right 03/30/2019   Procedure: Femoral revision right total hip arthroplasty;  Surgeon: [name], MD;  Location: [facility];  Service: Orthopedics;  Laterality: Right;  159min   FOREIGN BODY REMOVAL Right 04/02/2014   Procedure: FOREIGN BODY REMOVAL RIGHT FOOT;  Surgeon: [name], MD;  Location: [facility];  Service: General;  Laterality: Right;   JOINT REPLACEMENT     JOINT REPLACEMENT     LUMBAR [place] SURGERY     REVISION TOTAL HIP ARTHROPLASTY Bilateral 2005-2012   right-left   RIGHT/LEFT HEART CATH AND CORONARY/GRAFT ANGIOGRAPHY N/A 09/27/2022   Procedure: RIGHT/LEFT HEART CATH AND CORONARY/GRAFT ANGIOGRAPHY;  Surgeon: Martinique, Peter M, MD;  Location: [place] [facility] LAB;  Service: Cardiovascular;  Laterality: N/A;   SHOULDER OPEN ROTATOR CUFF REPAIR Right    TOTAL HIP ARTHROPLASTY Right 1999   TOTAL HIP ARTHROPLASTY Left 2008    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     Home Medications:  Prior to Admission medications   Medication Sig Start Date End Date Taking? Authorizing Provider  apixaban (ELIQUIS) 5 MG TABS tablet Take 1 tablet (5 mg total) by mouth 2 (two) times daily. 12/11/21 04/11/23  Satira Sark, MD  atorvastatin (LIPITOR) 80 MG tablet TAKE 1 TABLET BY MOUTH EVERY DAY 12/07/21   Satira Sark, MD  diltiazem (DILT-XR) 240 MG 24 hr capsule TAKE 1 CAPSULE BY MOUTH EVERY DAY 12/07/21   Satira Sark, MD  metoprolol succinate (TOPROL-XL) 100 MG 24 hr tablet TAKE 1 TABLET BY MOUTH EVERY DAY WITH OR IMMEDIATELY FOLLOWING A MEAL 05/08/22   Satira Sark, MD  nitroGLYCERIN (NITROSTAT) 0.4 MG SL tablet Place 1 tablet (0.4 mg total) under the tongue every 5 (five) minutes x 3 doses as needed for chest pain (if no relief after 3rd dose, proceed to the ED for an evaluation or call 911). 09/05/22   Satira Sark, MD  potassium chloride (KLOR-CON M) 10 MEQ tablet Take 3 tablets (30 mEq total) by mouth daily. 10/02/22 11/16/22  Donne Hazel, MD  spironolactone (ALDACTONE) 25 MG tablet Take 0.5 tablets (12.5 mg total) by mouth daily. Patient not taking: Reported on 11/16/2022 10/02/22 11/01/22  Donne Hazel, MD  torsemide (DEMADEX) 20 MG tablet Take 80 mg (4 tablets) daily. If weight 211 lbs or greater, take extra 20 mg Lasix that day 10/17/22   Darrick Grinder D, NP    Inpatient Medications: Scheduled Meds:  sodium chloride   Intravenous Once   pantoprazole (PROTONIX) IV  40 mg Intravenous Q12H   Continuous Infusions:  cefTRIAXone (ROCEPHIN)  IV     PRN Meds: acetaminophen **OR** acetaminophen, levalbuterol, ondansetron **OR** ondansetron (ZOFRAN) IV  Allergies:    Allergies  Allergen Reactions   Augmentin  [Amoxicillin-Pot Clavulanate] Nausea And Vomiting and Other (See Comments)    Has patient had a PCN reaction causing immediate rash, facial/tongue/throat swelling, SOB or lightheadedness with hypotension: Unknown Has patient had a PCN reaction causing severe rash involving mucus membranes or skin necrosis: No Has patient had a PCN reaction that required hospitalization: No Has patient had a PCN reaction occurring within the last 10 years: No If  all of the above answers are "NO", then may proceed with Cephalosporin use.     Social History:   Social History   Socioeconomic History   Marital status: Married    Spouse name: Jeani Hawking   Number of children: 2   Years of education: Not on file   Highest education level: Bachelor's degree (e.g., BA, AB, BS)  Occupational History   Occupation: PT Tourist information centre manager: RETIRED  Tobacco Use   Smoking status: Former    Packs/day: 0.50    Years: 2.00    Additional pack years: 0.00    Total pack years: 1.00    Types: Cigarettes    Quit date: 09/03/1966    Years since quitting: 56.2   Smokeless tobacco: Never  Vaping Use   Vaping Use: Never used  Substance and Sexual Activity   Alcohol use: Yes    Alcohol/week: 11.0 standard drinks of alcohol    Types: 7 Glasses of wine, 4 Cans of beer per week    Comment: a glass of wine with dinner/ beer on the weekend   Drug use: No   Sexual activity: Not on file  Other Topics Concern   Not on file  Social History Narrative   Lives with his wife.   Social Determinants of Health   Financial Resource Strain: Low Risk  (09/28/2022)   Overall Financial Resource Strain (CARDIA)    Difficulty of Paying Living Expenses: Not hard at all  Food Insecurity: No Food Insecurity (09/24/2022)   Hunger Vital Sign    Worried About Running Out of Food in the Last Year: Never true    Ran Out of Food in the Last Year: Never true  Transportation Needs: No Transportation Needs (09/24/2022)   PRAPARE -  Hydrologist (Medical): No    Lack of Transportation (Non-Medical): No  Physical Activity: Not on file  Stress: Stress Concern Present (04/21/2018)   [place]    Feeling of Stress : To some extent  Social Connections: Unknown (04/21/2018)   Social Connection and Isolation Panel [NHANES]    Frequency of Communication with Friends and Family: More than three times a week    Frequency of Social Gatherings with Friends and Family: Three times a week    Attends Religious Services: Patient declined    Active Member of Clubs or Organizations: Patient declined    Attends Archivist Meetings: Patient declined    Marital Status: Married  Human resources officer Violence: Not At Risk (09/24/2022)   Humiliation, Afraid, Rape, and Kick questionnaire    Fear of Current or Ex-Partner: No    Emotionally Abused: No    Physically Abused: No    Sexually Abused: No    Family History:    Family History  Problem Relation Age of Onset   Heart attack Brother    Colon cancer Neg Hx      ROS:  Constitutional: Denied fever, chills, malaise, night sweats Eyes: Denied vision change or loss Ears/Nose/Mouth/Throat: Denied ear ache, sore throat, coughing, sinus pain Cardiovascular: denied chest pain/pressure Respiratory: see HPI  Gastrointestinal: Denied nausea, vomiting, abdominal pain, diarrhea Genital/Urinary: Denied dysuria, hematuria, urinary frequency/urgency Musculoskeletal: see HPI  Skin: Denied rash, wound Neuro: Denied headache, dizziness, syncope Psych: Denied history of depression/anxiety  Endocrine: Denied history of diabetes      Physical Exam/Data:   Vitals:   11/19/22 [id number] 11/19/22 0719 11/19/22 0741 11/19/22 0800  BP: (!) 107/51 105/60 (!) 106/49   Pulse: 97 94 94   Resp: 18 17 18    Temp: 98.1 F (36.7 C) 98 F (36.7 C) 98.1 F (36.7 C)   TempSrc: Oral Oral Oral   SpO2: 96% 96%  98% 97%  Weight:      Height:        Intake/Output Summary (Last 24 hours) at 11/19/2022 1205 Last data filed at 11/19/2022 1100 Gross per 24 hour  Intake 408.17 ml  Output 2600 ml  Net -2191.83 ml      11/18/2022    6:40 PM 11/16/2022    9:18 AM 10/17/2022    9:52 AM  Last 3 Weights  Weight (lbs) 218 lb 14.4 oz 223 lb 210 lb 9.6 oz  Weight (kg) 99.292 kg 101.152 kg 95.528 kg     Body mass index is 30.53 kg/m.   Vitals:  Vitals:   11/19/22 0741 11/19/22 0800  BP: (!) 106/49   Pulse: 94   Resp: 18   Temp: 98.1 F (36.7 C)   SpO2: 98% 97%   General Appearance: In no apparent distress, laying in bed HEENT: Normocephalic, atraumatic.  Neck: Supple, trachea midline, no JVDs Cardiovascular: Irregularly irregular, normal Q000111Q, systolic murmur grade III RUSB Respiratory: Resting breathing unlabored, lungs sounds clear to auscultation bilaterally, no use of accessory muscles. On room air.  Gastrointestinal: Bowel sounds positive, abdomen soft, non-tender  Extremities: Able to move all extremities in bed without difficulty, chronic edema of BLE, non-pitting  Musculoskeletal: Normal muscle bulk and tone, Skin: Intact, warm, dry.  Neurologic: Alert, oriented to person, place and time. Slow mentation, Fluent speech, no cognitive deficit,  no gross focal neuro deficit Psychiatric: odd affect    EKG:  The EKG was personally reviewed and demonstrates:    A fib with ventricular rate of 90 bpm, ST depression of lateral leads noted in the past EKGs   Telemetry:  Telemetry was personally reviewed and demonstrates:    A fib 80-90s    Relevant CV Studies:  [facility] 09/27/22:     Ost LAD to Prox LAD lesion is 100% stenosed.   Ramus lesion is 100% stenosed.   Prox RCA to Mid RCA lesion is 60% stenosed.   Dist RCA lesion is 90% stenosed.   Origin to Prox Graft lesion is 100% stenosed.   LIMA graft was visualized by angiography and is large.   RIMA graft was visualized by  non-selective angiography and is normal in caliber.   The graft exhibits no disease.   The graft exhibits no disease.   LV end diastolic pressure is mildly elevated.   Hemodynamic findings consistent with mild pulmonary hypertension.   There is moderate aortic valve stenosis.   3 vessel occlusive CAD. The native LCx is widely patent LIMA to the LAD is patent RIMA to the PDA is patent SVG to the ramus intermediate is occluded. This appears to be chronic Mild to Moderate aortic stenosis. AV mean gradient 18.5 mm Hg. AVA 2.5 cm squared with index 1.15. Elevated LV filling pressures. LVEDP 20 mm Hg. Mean PCWP 24 mm Hg with large V waves to 42 mm Hg Mild pulmonary HTN. PAP 56/17 with mean 31 mm Hg.    Plan: medical management. Continue diuresis. Would benefit greatly from correction of severe anemia. Will need to monitor Aortic stenosis serially going forward.   Echo 09/25/22:  1. Left ventricular ejection fraction, by estimation, is 55 to 60%. The  left ventricle has normal  function. The left ventricle has no regional  wall motion abnormalities. There is moderate concentric left ventricular  hypertrophy. Left ventricular  diastolic parameters are indeterminate.   2. Right ventricular systolic function is normal. The right ventricular  size is mildly enlarged. Tricuspid regurgitation signal is inadequate for  assessing PA pressure.   3. Left atrial size was severely dilated.   4. Right atrial size was mildly dilated.   5. Left pleural effusion noted.   6. The mitral valve is degenerative. Mild mitral valve regurgitation.  Severe mitral annular calcification.   7. The aortic valve is tricuspid. There is moderate calcification of the  aortic valve. Aortic valve regurgitation is not visualized. Moderate to  severe aortic valve stenosis. Aortic valve mean gradient measures 24.0  mmHg. Dimentionless index 0.34.   8. Aortic dilatation noted. There is mild dilatation of the ascending  aorta,  measuring 39 mm.   9. The inferior vena cava is dilated in size with <50% respiratory  variability, suggesting right atrial pressure of 15 mmHg.   Comparison(s): Prior images reviewed side by side. LVEF 55-60% range.  Degenerative, calcific aortic stenosis of moderate to severe range with  similar gradients as prior study.     Laboratory Data:  High Sensitivity Troponin:   Recent Labs  Lab 11/19/22 0021 11/19/22 0213  TROPONINIHS 34* 36*     Chemistry Recent Labs  Lab 11/18/22 2113 11/19/22 0021  NA 132* 131*  K 4.2 4.2  CL 97* 99  CO2 26 26  GLUCOSE 97 88  BUN 15 13  CREATININE 0.78 0.77  CALCIUM 8.7* 8.5*  MG 2.3  --   GFRNONAA >60 >60  ANIONGAP 9 6    Recent Labs  Lab 11/18/22 2113 11/19/22 0021  PROT 5.7* 5.6*  ALBUMIN 3.0* 2.9*  AST 26 24  ALT 26 25  ALKPHOS 198* 217*  BILITOT 3.0* 2.7*   Lipids No results for input(s): "CHOL", "TRIG", "HDL", "LABVLDL", "LDLCALC", "CHOLHDL" in the last 168 hours.  Hematology Recent Labs  Lab 11/18/22 2113 11/19/22 0021 11/19/22 1045  WBC 7.5 7.3  --   RBC 2.02* 1.96*  1.93*  --   HGB 7.5* 7.1* 8.1*  HCT 23.2* 22.8* 25.3*  MCV 114.9* 116.3*  --   MCH 37.1* 36.2*  --   MCHC 32.3 31.1  --   RDW 25.1* 25.0*  --   PLT 164 171  --    Thyroid  Recent Labs  Lab 11/18/22 2113  TSH 2.685    BNP Recent Labs  Lab 11/18/22 2113 11/19/22 1045  BNP 779.5* 634.9*    DDimer No results for input(s): "DDIMER" in the last 168 hours.   Radiology/Studies:  DG Chest 2 View  Result Date: 11/19/2022 CLINICAL DATA:  Congestive failure EXAM: CHEST - 2 VIEW COMPARISON:  09/25/2022 FINDINGS: Heart is again enlarged in size. Postsurgical changes are again seen. Pleural effusions are noted right considerably greater than left. Basilar atelectasis is noted right greater than left. Mild central vascular congestion is seen without significant edema. IMPRESSION: Mild vascular congestion without significant edema. Bilateral  pleural effusions and associated atelectatic changes. Electronically Signed   By: Inez Catalina M.D.   On: 11/19/2022 11:34     Assessment and Plan:   Acute on chronic diastolic heart failure  - he denied worsening chronic SOB, leg edema today, this was reported by admission provider  - weight is 218.9ibs, dry weight is around 210ibs based on records, was 223 on 11/15/21 -  on torsemide 80mg  daily, when questioned, he was not aware what this is, query compliance  - would start IV Lasix 40mg  BID (hold if SBP <100), track intake and output, daily weight  - no significant iron deficiency, agree with GI workup rule out bleeding  - GDMT: hold PTA metoprolol XL 100mg  daily, aldactone 12.5mg  daily given borderline low BP, may add farixga 10mg  daily before discharge   Permanent A fib Hx of atrial flutter s/p remote ablation  - hold rate control agents metoprolol and diltiazem, resume when BP is stable  - agree holding Eliquis given + FOBT and anemia, pending EGD and GI clearance for anticoagulation   CAD with hx of CABG 2003  and BMS to SVG to ramus 2017 - no chest pain - Hs trop 34>36 - EKG non-specific  - last LHC 09/27/22 stable CAD, continue medical therapy  - on Eliquis therefore no ASA, held now due to possible GI bleed  - continue PTA metoprolol XL 100mg  and lipitor 80mg    Moderate Aortic Stenosis - Mild to Moderate aortic stenosis. AV mean gradient 18.5 mm Hg. AVA 2.5 cm squared with index 1.15. - Followed every 6 months with Dr Domenic Polite   Suspected GI bleed Anemia  ETOH use PAD - per primary team     Risk Assessment/Risk Scores:   New York Heart Association (NYHA) Functional Class NYHA Class II  CHA2DS2-VASc Score = 5  This indicates a 7.2% annual risk of stroke. The patient's score is based upon: CHF History: 1 HTN History: 1 Diabetes History: 0 Stroke History: 0 Vascular Disease History: 1 Age Score: 2 Gender Score: 0    For questions or updates, please contact  [place] Please consult [email or web address] for contact info under    Signed, Margie Billet, NP  11/19/2022 12:05 PM   Personally seen and examined. Agree with above.  82 year old with known coronary disease prior bypass with bare-metal stent to SVG to ramus in 2017 with moderate aortic stenosis peripheral arterial disease, recent anemia, shortness of breath  Has been seen in the past by advanced heart failure clinic.  On exam sitting in chair, no distress, mild crackles heard at bases, 3/6 systolic crescendo decrescendo murmur, irregularly irregular rhythm.  Acute on chronic diastolic heart failure with suspected GI bleed - Agree with endoscopy.  He may proceed from cardiac perspective. - Giving Lasix 80 mg 40 twice daily while watching blood pressure. - Holding metoprolol XL 100 mg daily as well as Aldactone 12.5 mg daily given borderline low blood pressure. -Wife at bedside.  Moderate aortic stenosis - Continue to monitor clinically.  Does not require valve replacement at this point.  Murmur heard on exam.  Chronic anticoagulation secondary to permanent atrial fibrillation - Holding anticoagulation given significant anemia  Candee Furbish, MD

## 2022-11-19 NOTE — Consult Note (Addendum)
Consultation  Referring Provider: Juanda Chance, MD Primary Care Physician:  Alvira Monday, FNP Primary Gastroenterologist:  Dr.Rourk   Reason for Consultation: Anemia, heme positive stool, complaints of dark stools  HPI: Leonard Cisneros is a 82 y.o. male, who was admitted to [facility] on Friday, 11/16/2022 after being seen by his cardiologist earlier in the day and felt to be volume overloaded.  Apparently there were no beds at [facility] or here.  He was transferred here last evening as no GI care available at [facility]. He has history of congestive heart failure with preserved EF, coronary artery disease status post CABG, pulmonary hypertension, atrial fibrillation/flutter, chronic anticoagulation with Eliquis and history of EtOH abuse.  Also with moderate to severe aortic stenosis. He says he feels about the same as he did when admitted on Friday and that his breathing "is not at its best".  He has been more short of breath over the past couple of weeks, had gained weight and also has had significant increase in lower extremity edema. He also reports intermittent dark stools over the past couple of weeks, says this is not occurring on a daily basis and he has not noticed any overt blood. He denies any abdominal pain or cramping, no nausea or vomiting, no diarrhea.  Appetite has been poor over the past couple of weeks.  He denies any issues with ongoing heartburn or indigestion no dysphagia or dyne aphasia.  No NSAID use. He does drink alcohol daily "2 beers".  I do not see that he has had prior endoscopy but has had colonoscopies per Dr. Gala Romney, the last was done in 2019 for follow-up of adenomatous colon polyps and was a normal exam.  Hemoglobin on admission to [facility] on 11/16/2022 was 7.8/hematocrit 23.9/MCV 113/platelets 209 INR 1.97 BNP 2297 Globin yesterday there was 7.5, I am not certain whether or not he was transfused.  Labs today globin 7.1/hematocrit  22.8/MCV 116/platelets 171 BUN 13/creatinine 0.77 Albumin 2.9 T. bili 2.7/alk phos 217 B12 545 Iron 76/TIBC 276/iron sat 28 INR 1.3 Troponin 34/36  No imaging here. Last x-ray on 11/16/2022 showed pulmonary vascular congestion and mild diffuse interstitial thickening consistent with congestive heart failure     Past Medical History:  Diagnosis Date   Aortic stenosis    Atrial flutter (HCC)    a. s/p remote ablation by Dr. [name].   CAD (coronary artery disease)    a. s/p CABG 2003. b. 01/2016: unstable angina - s/p BMS to SVG-ramus intermediate, patent LIMA-mLAD, patent RIMA-dRCA.    Chronic lower back pain    Essential hypertension    Hyperlipidemia    Ischemic cardiomyopathy    a. Cath 01/2016 -  EF 50-55% with mild distal inferior hypocontractility.   Peripheral neuropathy 04/04/2014   Permanent atrial fibrillation [facility])     Past Surgical History:  Procedure Laterality Date   BACK SURGERY     CARDIAC CATHETERIZATION  ~ 2000; 2003   CARDIAC CATHETERIZATION N/A 02/01/2016   Procedure: Left Heart Cath and Cors/Grafts Angiography;  Surgeon: [name], MD;  Location: [place] [facility];  Service: Cardiovascular;  Laterality: N/A;   CARDIAC CATHETERIZATION N/A 02/01/2016   Procedure: Coronary Stent Intervention;  Surgeon: [name], MD;  Location: [place] [facility];  Service: Cardiovascular;  Laterality: N/A;   CATARACT EXTRACTION W/PHACO Left 02/09/2019   Procedure: CATARACT EXTRACTION PHACO AND INTRAOCULAR LENS PLACEMENT (IOC);  Surgeon: [name], MD;  Location: [facility];  Service: Ophthalmology;  Laterality: Left;  CDE: 27.70   CATARACT EXTRACTION W/PHACO Right 02/23/2019   Procedure: CATARACT EXTRACTION PHACO AND INTRAOCULAR LENS PLACEMENT RIGHT EYE;  Surgeon: [name], MD;  Location: [facility];  Service: Ophthalmology;  Laterality: Right;  CDE: 10.15   COLONOSCOPY  06/04/2002   Normal colon and rectum   COLONOSCOPY  07/08/2012   Procedure: COLONOSCOPY;   Surgeon: [name], MD;  Location: [facility];  Service: Endoscopy;  Laterality: N/A;  11:30   COLONOSCOPY N/A 10/02/2017   Procedure: COLONOSCOPY;  Surgeon: [name], MD;  Location: [facility];  Service: Endoscopy;  Laterality: N/A;  10:30am   CORONARY ANGIOPLASTY WITH STENT PLACEMENT  02/01/2016   CORONARY ARTERY BYPASS GRAFT  2003   LIMA to LAD,LIMA to distal RCA,SVG to ramus intermediate vessel.   FEMORAL REVISION Right 03/30/2019   Procedure: Femoral revision right total hip arthroplasty;  Surgeon: [name], MD;  Location: [facility];  Service: Orthopedics;  Laterality: Right;  17min   FOREIGN BODY REMOVAL Right 04/02/2014   Procedure: FOREIGN BODY REMOVAL RIGHT FOOT;  Surgeon: [name], MD;  Location: [facility];  Service: General;  Laterality: Right;   JOINT REPLACEMENT     JOINT REPLACEMENT     LUMBAR [place] SURGERY     REVISION TOTAL HIP ARTHROPLASTY Bilateral 2005-2012   right-left   RIGHT/LEFT HEART CATH AND CORONARY/GRAFT ANGIOGRAPHY N/A 09/27/2022   Procedure: RIGHT/LEFT HEART CATH AND CORONARY/GRAFT ANGIOGRAPHY;  Surgeon: Martinique, Peter M, MD;  Location: [place] [facility];  Service: Cardiovascular;  Laterality: N/A;   SHOULDER OPEN ROTATOR CUFF REPAIR Right    TOTAL HIP ARTHROPLASTY Right 1999   TOTAL HIP ARTHROPLASTY Left 2008   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    Prior to Admission medications   Medication Sig Start Date End Date Taking? Authorizing Provider  apixaban (ELIQUIS) 5 MG TABS tablet Take 1 tablet (5 mg total) by mouth 2 (two) times daily. 12/11/21 04/11/23  Satira Sark, MD  atorvastatin (LIPITOR) 80 MG tablet TAKE 1 TABLET BY MOUTH EVERY DAY 12/07/21   Satira Sark, MD  diltiazem (DILT-XR) 240 MG 24 hr capsule TAKE 1 CAPSULE BY MOUTH EVERY DAY  12/07/21   Satira Sark, MD  metoprolol succinate (TOPROL-XL) 100 MG 24 hr tablet TAKE 1 TABLET BY MOUTH EVERY DAY WITH OR IMMEDIATELY FOLLOWING A MEAL 05/08/22   Satira Sark, MD  nitroGLYCERIN (NITROSTAT) 0.4 MG SL tablet Place 1 tablet (0.4 mg total) under the tongue every 5 (five) minutes x 3 doses as needed for chest pain (if no relief after 3rd dose, proceed to the ED for an evaluation or call 911). 09/05/22   Satira Sark, MD  potassium chloride (KLOR-CON M) 10 MEQ tablet Take 3 tablets (30 mEq total) by mouth daily. 10/02/22 11/16/22  Donne Hazel, MD  spironolactone (ALDACTONE) 25 MG tablet Take 0.5 tablets (12.5 mg total) by mouth daily. Patient not taking: Reported on 11/16/2022 10/02/22 11/01/22  Donne Hazel, MD  torsemide (DEMADEX) 20 MG tablet Take 80 mg (4 tablets) daily. If weight 211 lbs or greater, take extra 20 mg Lasix that day 10/17/22   Darrick Grinder D, NP    Current Facility-Administered Medications  Medication Dose Route Frequency Provider Last Rate Last Admin   0.9 %  sodium chloride infusion (Manually program via Guardrails IV Fluids)   Intravenous Once Clance Boll, MD       acetaminophen (TYLENOL) tablet 650 mg  650 mg Oral Q6H PRN Clance Boll, MD       Or   acetaminophen (TYLENOL) suppository 650 mg  650 mg Rectal Q6H PRN Clance Boll, MD       cefTRIAXone (ROCEPHIN) 1 g in sodium chloride 0.9 % 100 mL IVPB  1 g Intravenous Q24H Myles Rosenthal A, MD       levalbuterol Penne Lash) nebulizer solution 0.63 mg  0.63 mg Nebulization Q6H PRN Clance Boll, MD       ondansetron [facility]) tablet 4 mg  4 mg Oral Q6H PRN Clance Boll, MD       Or   ondansetron [facility]) injection 4 mg  4 mg Intravenous Q6H PRN Clance Boll, MD       pantoprazole (PROTONIX) injection 40 mg  40 mg Intravenous Q12H Myles Rosenthal A, MD   40 mg at 11/19/22 0813    Allergies as of 11/16/2022 - Review Complete 11/16/2022  Allergen Reaction  Noted   Augmentin [amoxicillin-pot clavulanate] Nausea And Vomiting and Other (See Comments) 01/14/2013    Family History  Problem Relation Age of Onset   Heart attack Brother    Colon cancer Neg Hx     Social History   Socioeconomic History   Marital status: Married    Spouse name: Jeani Hawking   Number of children: 2   Years of education: Not on file   Highest education level: Bachelor's degree (e.g., BA, AB, BS)  Occupational History   Occupation: PT Tourist information centre manager: RETIRED  Tobacco Use   Smoking status: Former    Packs/day: 0.50    Years: 2.00    Additional pack years: 0.00    Total pack years: 1.00    Types: Cigarettes    Quit date: 09/03/1966    Years since quitting: 56.2   Smokeless tobacco: Never  Vaping Use   Vaping Use: Never used  Substance and Sexual Activity   Alcohol use: Yes    Alcohol/week: 11.0 standard drinks of alcohol    Types: 7 Glasses of wine, 4 Cans of beer per week    Comment: a glass of wine with dinner/ beer on the weekend   Drug use: No   Sexual activity: Not on file  Other Topics Concern   Not on file  Social History Narrative   Lives with his wife.   Social Determinants of Health   Financial Resource Strain: Low Risk  (09/28/2022)   Overall Financial Resource Strain (CARDIA)    Difficulty of Paying Living Expenses: Not hard at all  Food Insecurity: No Food Insecurity (09/24/2022)   Hunger Vital Sign    Worried About Running Out of Food in the Last Year: Never true    Ran Out of Food in the Last Year: Never true  Transportation Needs: No Transportation Needs (09/24/2022)   PRAPARE - Hydrologist (Medical): No    Lack of Transportation (Non-Medical): No  Physical Activity: Not on file  Stress: Stress Concern Present (04/21/2018)   [place]    Feeling of Stress : To some extent  Social Connections: Unknown (04/21/2018)   Social  Connection and Isolation Panel [NHANES]    Frequency of Communication with Friends and Family: More than three  times a week    Frequency of Social Gatherings with Friends and Family: Three times a week    Attends Religious Services: Patient declined    Active Member of Clubs or Organizations: Patient declined    Attends Archivist Meetings: Patient declined    Marital Status: Married  Human resources officer Violence: Not At Risk (09/24/2022)   Humiliation, Afraid, Rape, and Kick questionnaire    Fear of Current or Ex-Partner: No    Emotionally Abused: No    Physically Abused: No    Sexually Abused: No    Review of Systems: Pertinent positive and negative review of systems were noted in the above HPI section.  All other review of systems was otherwise negative.   Physical Exam: Vital signs in last 24 hours: Temp:  [97.4 F (36.3 C)-98.6 F (37 C)] 98.1 F (36.7 C) (03/18 0741) Pulse Rate:  [94-122] 94 (03/18 0741) Resp:  [17-20] 18 (03/18 0741) BP: (93-117)/(49-65) 106/49 (03/18 0741) SpO2:  [91 %-100 %] 96 % (03/18 0719) Weight:  [99.3 kg] 99.3 kg (03/17 1840)   General:   Alert,  Well-developed, well-nourished, elderly white male pleasant and cooperative in NAD on O2 2 L Head:  Normocephalic and atraumatic. Eyes:  Sclera clear, no icterus.   Conjunctiva pink. Ears:  Normal auditory acuity. Nose:  No deformity, discharge,  or lesions. Mouth:  No deformity or lesions.   Neck:  Supple; no masses or thyromegaly. Lungs: Bibasilar Rales, upper one third bilaterally . Heart:  irRegular rate and rhythm; no murmurs, clicks, rubs,  or gallops. Abdomen:  Soft,nontender, BS active,nonpalp mass or hsm.   Rectal: Not done, documented heme positive on admission Msk:  Symmetrical without gross deformities. . Pulses:  Normal pulses noted. Extremities: Plus edema bilateral lower extremities to the shins, chronic stasis changes, chronic wound on the left shin Neurologic:  Alert and   oriented x4;  grossly normal neurologically. Skin:  Intact without significant lesions or rashes.. Psych:  Alert and cooperative. Normal mood and affect.  Intake/Output from previous day: 03/17 0701 - 03/18 0700 In: 100 [IV Piggyback:100] Out: 1600 [Urine:1600] Intake/Output this shift: Total I/O In: 308.2 [Blood:308.2] Out: 400 [Urine:400]  Lab Results: Recent Labs    11/18/22 2113 11/19/22 0021  WBC 7.5 7.3  HGB 7.5* 7.1*  HCT 23.2* 22.8*  PLT 164 171   BMET Recent Labs    11/18/22 2113 11/19/22 0021  NA 132* 131*  K 4.2 4.2  CL 97* 99  CO2 26 26  GLUCOSE 97 88  BUN 15 13  CREATININE 0.78 0.77  CALCIUM 8.7* 8.5*   LFT Recent Labs    11/19/22 0021  PROT 5.6*  ALBUMIN 2.9*  AST 24  ALT 25  ALKPHOS 217*  BILITOT 2.7*   PT/INR Recent Labs    11/18/22 2113  LABPROT 16.0*  INR 1.3*   Hepatitis Panel No results for input(s): "HEPBSAG", "HCVAB", "HEPAIGM", "HEPBIGM" in the last 72 hours.     IMPRESSION:  #23 82 year old white male with macrocytic anemia, heme positive stool and reports of dark stool intermittently over the past couple of weeks in setting of chronic Eliquis  Hemoglobin 7.1 today, no evidence of B12 or folate or iron deficiency  Etiology of anemia and heme positive stool is not clear, he has had prior colonoscopies and last colonoscopy in 2019 per Dr. Gala Romney was normal .  Rule out chronic gastropathy, AVMs, peptic ulcer disease, possible portal gastropathy, occult neoplasm  #2 chronic EtOH use-no  cirrhosis by ultrasound earlier this year January 2024 #3 cholelithiasis  #4 acute on chronic congestive heart failure with 2 to 3-week history of progressive shortness of breath and lower extremity edema as well as weight gain. Initial BNP on admission 11/16/2022-2297  #5 coronary artery disease status post CABG #6.  Peripheral arterial disease #7.  History of pulmonary hypertension #8.  Moderate to severe aortic stenosis #9.  Atrial  fibrillation/flutter  Plan; He will need at least EGD, but would like to optimize cardiopulmonary status prior to sedation, and the EGD is not emergent. Start heart healthy diet Await cardiology  input  Will trend hemoglobin every 12 hours and would transfuse 1 unit for hemoglobin less than 7 Continue to hold Eliquis-last dose Friday, 11/16/2022 Agree with IV PPI twice daily GI will follow with you    Amy EsterwoodPA-C  11/19/2022, 9:40 AM   Attending physician's note  I have taken a history, reviewed the chart and examined the patient. I performed a substantive portion of this encounter, including complete performance of at least one of the key components, in conjunction with the APP. I agree with the APP's note, impression and recommendations.   82 year old very pleasant gentleman with severe CHF, CAD s/p CABG, A-fib on chronic anticoagulation with Eliquis with worsening anemia, melena and heme positive stool  Will plan for EGD once his volume status improves and is more optimized from a cardiopulmonary standpoint Hold Eliquis PPI twice daily Continue diet as tolerated  GI will continue to follow along  The patient was provided an opportunity to ask questions and all were answered. The patient agreed with the plan and demonstrated an understanding of the instructions.  Damaris Hippo , MD [phone]

## 2022-11-19 NOTE — Progress Notes (Addendum)
Unable to give ordered 1 unit PRBC at this time d/t "The patient's antibody screen has come up positive and there will be a delay in results and finding compatible RBC while we identify the antibody. " Per blood bank  Manon Hilding MD updated

## 2022-11-20 ENCOUNTER — Inpatient Hospital Stay (HOSPITAL_COMMUNITY): Payer: Medicare HMO

## 2022-11-20 DIAGNOSIS — K921 Melena: Secondary | ICD-10-CM | POA: Diagnosis not present

## 2022-11-20 DIAGNOSIS — I5033 Acute on chronic diastolic (congestive) heart failure: Secondary | ICD-10-CM | POA: Diagnosis not present

## 2022-11-20 DIAGNOSIS — R195 Other fecal abnormalities: Secondary | ICD-10-CM

## 2022-11-20 DIAGNOSIS — R0602 Shortness of breath: Secondary | ICD-10-CM

## 2022-11-20 DIAGNOSIS — Z951 Presence of aortocoronary bypass graft: Secondary | ICD-10-CM | POA: Diagnosis not present

## 2022-11-20 DIAGNOSIS — I25810 Atherosclerosis of coronary artery bypass graft(s) without angina pectoris: Secondary | ICD-10-CM | POA: Diagnosis not present

## 2022-11-20 DIAGNOSIS — I10 Essential (primary) hypertension: Secondary | ICD-10-CM | POA: Diagnosis not present

## 2022-11-20 DIAGNOSIS — D539 Nutritional anemia, unspecified: Secondary | ICD-10-CM | POA: Diagnosis not present

## 2022-11-20 DIAGNOSIS — D500 Iron deficiency anemia secondary to blood loss (chronic): Secondary | ICD-10-CM

## 2022-11-20 DIAGNOSIS — I739 Peripheral vascular disease, unspecified: Secondary | ICD-10-CM | POA: Diagnosis not present

## 2022-11-20 LAB — VAS US ABI WITH/WO TBI

## 2022-11-20 LAB — RENAL FUNCTION PANEL
Albumin: 2.9 g/dL — ABNORMAL LOW (ref 3.5–5.0)
Anion gap: 7 (ref 5–15)
BUN: 12 mg/dL (ref 8–23)
CO2: 29 mmol/L (ref 22–32)
Calcium: 8.1 mg/dL — ABNORMAL LOW (ref 8.9–10.3)
Chloride: 98 mmol/L (ref 98–111)
Creatinine, Ser: 0.92 mg/dL (ref 0.61–1.24)
GFR, Estimated: >60 mL/min (ref >60)
Glucose, Bld: 95 mg/dL (ref 70–99)
Phosphorus: 3.2 mg/dL (ref 2.5–4.6)
Potassium: 3.5 mmol/L (ref 3.5–5.1)
Sodium: 134 mmol/L — ABNORMAL LOW (ref 135–145)

## 2022-11-20 LAB — MAGNESIUM: Magnesium: 1.9 mg/dL (ref 1.7–2.4)

## 2022-11-20 LAB — CBC
HCT: 24.9 % — ABNORMAL LOW (ref 39.0–52.0)
Hemoglobin: 8 g/dL — ABNORMAL LOW (ref 13.0–17.0)
MCH: 36.2 pg — ABNORMAL HIGH (ref 26.0–34.0)
MCHC: 32.1 g/dL (ref 30.0–36.0)
MCV: 112.7 fL — ABNORMAL HIGH (ref 80.0–100.0)
Platelets: 156 10*3/uL (ref 150–400)
RBC: 2.21 MIL/uL — ABNORMAL LOW (ref 4.22–5.81)
RDW: 26.4 % — ABNORMAL HIGH (ref 11.5–15.5)
WBC: 6.9 10*3/uL (ref 4.0–10.5)
nRBC: 0 % (ref 0.0–0.2)

## 2022-11-20 LAB — HEMOGLOBIN AND HEMATOCRIT, BLOOD
HCT: 26.3 % — ABNORMAL LOW (ref 39.0–52.0)
HCT: 25.3 % — ABNORMAL LOW (ref 39.0–52.0)
Hemoglobin: 8.4 g/dL — ABNORMAL LOW (ref 13.0–17.0)
Hemoglobin: 8.3 g/dL — ABNORMAL LOW (ref 13.0–17.0)

## 2022-11-20 LAB — HEMOGLOBIN A1C
Hgb A1c MFr Bld: 4.6 % — ABNORMAL LOW (ref 4.8–5.6)
Mean Plasma Glucose: 85 mg/dL

## 2022-11-20 LAB — TSH: TSH: 3.009 u[IU]/mL (ref 0.350–4.500)

## 2022-11-20 MED ORDER — POTASSIUM CHLORIDE CRYS ER 20 MEQ PO TBCR
40.0000 meq | EXTENDED_RELEASE_TABLET | Freq: Once | ORAL | Status: AC
  Administered 2022-11-20: 40 meq via ORAL
  Filled 2022-11-20: qty 2

## 2022-11-20 MED ORDER — MELATONIN 5 MG PO TABS
5.0000 mg | ORAL_TABLET | Freq: Once | ORAL | Status: AC
  Administered 2022-11-20: 5 mg via ORAL
  Filled 2022-11-20: qty 1

## 2022-11-20 MED ORDER — METOPROLOL SUCCINATE ER 25 MG PO TB24
25.0000 mg | ORAL_TABLET | Freq: Every day | ORAL | Status: DC
  Administered 2022-11-20 – 2022-11-22 (×2): 25 mg via ORAL
  Filled 2022-11-20 (×2): qty 1

## 2022-11-20 NOTE — H&P (View-Only) (Signed)
Progress Note  Primary GI: Dr.Rourk      Subjective  Chief Complaint: Anemia, heme positive stool, complaints of dark stools   No family was present at the time of my evaluation. Patient sitting up in chair beside bed. States his breathing is improved. Had bowel movement this morning brown, denies any melena or hematochezia.  Denies abdominal pain, nausea vomiting. Patient did have breakfast this morning. Has had some lower blood pressures, systolic 0000000 to [id number].  No dizziness, chest pain, shortness of breath    Objective   Vital signs in last 24 hours: Temp:  [98 F (36.7 C)-98.8 F (37.1 C)] 98 F (36.7 C) (03/19 0743) Pulse Rate:  [92-112] 112 (03/19 0743) Resp:  [17-19] 18 (03/19 0743) BP: (96-116)/(50-59) 96/50 (03/19 0743) SpO2:  [91 %-97 %] 96 % (03/19 0422) Weight:  [95.2 kg] 95.2 kg (03/19 0422) Last BM Date : 11/18/22 Last BM recorded by nurses in past 5 days No data recorded  General:   male in no acute distress  Heart:  Irregular rhythm; no murmurs Pulm: Clear anteriorly; no wheezing, not on oxygen Abdomen:  Soft, Obese AB, Active bowel sounds. No tenderness , No organomegaly appreciated. Extremities:  without  edema. Stasis changes. Neurologic:  Alert and  oriented x4;  No focal deficits.  Psych:  Cooperative. Normal mood and affect.  Intake/Output from previous day: 03/18 0701 - 03/19 0700 In: 788.2 [P.O.:480; Blood:308.2] Out: 1750 [Urine:1750] Intake/Output this shift: Total I/O In: 120 [P.O.:120] Out: -   Studies/Results: DG Chest 2 View  Result Date: 11/19/2022 CLINICAL DATA:  Congestive failure EXAM: CHEST - 2 VIEW COMPARISON:  09/25/2022 FINDINGS: Heart is again enlarged in size. Postsurgical changes are again seen. Pleural effusions are noted right considerably greater than left. Basilar atelectasis is noted right greater than left. Mild central vascular congestion is seen without significant edema. IMPRESSION: Mild vascular congestion  without significant edema. Bilateral pleural effusions and associated atelectatic changes. Electronically Signed   By: Inez Catalina M.D.   On: 11/19/2022 11:34    Lab Results: Recent Labs    11/18/22 2113 11/19/22 0021 11/19/22 1045 11/19/22 2222 11/20/22 0031  WBC 7.5 7.3  --   --  6.9  HGB 7.5* 7.1* 8.1* 8.0* 8.0*  HCT 23.2* 22.8* 25.3* 24.5* 24.9*  PLT 164 171  --   --  156   BMET Recent Labs    11/18/22 2113 11/19/22 0021 11/20/22 0031  NA 132* 131* 134*  K 4.2 4.2 3.5  CL 97* 99 98  CO2 26 26 29   GLUCOSE 97 88 95  BUN 15 13 12   CREATININE 0.78 0.77 0.92  CALCIUM 8.7* 8.5* 8.1*   LFT Recent Labs    11/19/22 0021 11/20/22 0031  PROT 5.6*  --   ALBUMIN 2.9* 2.9*  AST 24  --   ALT 25  --   ALKPHOS 217*  --   BILITOT 2.7*  --    PT/INR Recent Labs    11/18/22 2113  LABPROT 16.0*  INR 1.3*     Scheduled Meds:  sodium chloride   Intravenous Once   furosemide  40 mg Intravenous BID   metoprolol succinate  25 mg Oral Daily   pantoprazole (PROTONIX) IV  40 mg Intravenous Q12H   potassium chloride  40 mEq Oral Once   Continuous Infusions:    Patient profile:   82 year old white male with macrocytic anemia, heme positive stool and reports of dark stool intermittently over the  past couple of weeks in setting of chronic Eliquis   Impression/Plan:   Microcytic anemia, FOBT positive stools, melena, Eliquis on hold CBC on 11/20/2022   HGB 8.0 ( 7.5 on admission s/p 1 PRBC)  MCV 112.7 Platelets 156 11/19/2022 Iron 76 Ferritin 304 B12 545 Colonoscopy 2019 normal per Dr. Gala Romney Continue PPI twice daily Patient ate this morning, will plan for EGD tomorrow.  N.p.o. midnight. Will schedule EGD to evaluate for possible H. pylori, esophagitis, gastritis, peptic ulcer disease, etc.. I discussed risks of EGD with patient today, including risk of sedation, bleeding or perforation.  Patient provides understanding and gave verbal consent to proceed.  Acute on chronic  congestive heart failure BNP on admission 2297 Getting IV Lasix, followed by cardiology, I put yesterday 1.7L Breathing status improved  CAD status post CABG, peripheral arterial disease, A-fib, moderate to severe aortic stenosis Last dose of Eliquis 11/16/2022 No chest pain, negative troponin, EKG without ACS  Chronic alcohol use January 2024 no cirrhosis on ultrasound Increase risk gastritis/PUD  Principal Problem:   SOB (shortness of breath) Active Problems:   Hx of CABG   Obesity (BMI 30-39.9)   Essential hypertension   Ischemic cardiomyopathy   PAD (peripheral artery disease) (HCC)   Atrial fibrillation with RVR (HCC)   Acute on chronic diastolic congestive heart failure (HCC)   Macrocytic anemia   Hyponatremia   Acquired thrombophilia (HCC)   History of CAD (coronary artery disease)   Elevated troponin    LOS: 2 days   Vladimir Crofts  11/20/2022, 10:02 AM   Attending physician's note   I have taken a history, reviewed the chart and examined the patient. I performed a substantive portion of this encounter, including complete performance of at least one of the key components, in conjunction with the APP. I agree with the APP's note, impression and recommendations.    Plan to proceed with EGD tomorrow to further evaluate recent episode of melena and worsening anemia Acute on chronic CHF, improving N.p.o. after midnight     K. Denzil Magnuson , MD [phone]

## 2022-11-20 NOTE — Evaluation (Signed)
Physical Therapy Evaluation Patient Details Name: Leonard Cisneros MRN: [id number] DOB: 1941/05/28 Today's Date: 11/20/2022  History of Present Illness  82 y.o. 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 Pt presented to the [facility] ER for several weeks of SOB and transferred to [facility] for further work-up.  Clinical Impression  Pt presents with admitting diagnosis above. Pt moves fairly well and was able to ambulate in hallway with crutches. Pt educated that he would likely benefit more from RW from a stability and energy expenditure standpoint however pt adamantly refused stating that he felt more mobile with crutches. Pt presents near baseline for mobility however has procedure scheduled for tomorrow. Anticipate that pt will likely only need 1 more visit after procedure then can DC home with HHPT. PT will continue to follow.        Recommendations for follow up therapy are one component of a multi-disciplinary discharge planning process, led by the attending physician.  Recommendations may be updated based on patient status, additional functional criteria and insurance authorization.  Follow Up Recommendations Home health PT      Assistance Recommended at Discharge Set up Supervision/Assistance  Patient can return home with the following  A little help with walking and/or transfers;A little help with bathing/dressing/bathroom;Assistance with cooking/housework;Assist for transportation;Help with stairs or ramp for entrance    Equipment Recommendations None recommended by PT (Pt has needed DME)  Recommendations for Other Services       Functional Status Assessment Patient has had a recent decline in their functional status and  demonstrates the ability to make significant improvements in function in a reasonable and predictable amount of time.     Precautions / Restrictions Precautions Precautions: Fall Restrictions Weight Bearing Restrictions: No      Mobility  Bed Mobility               General bed mobility comments:  (Pt received in chair)    Transfers Overall transfer level: Needs assistance Equipment used: Crutches Transfers: Sit to/from Stand Sit to Stand: Supervision                Ambulation/Gait Ambulation/Gait assistance: Supervision Gait Distance (Feet): 150 Feet Assistive device: Crutches Gait Pattern/deviations: Step-through pattern, Trunk flexed, Decreased stride length Gait velocity: decreased     General Gait Details: no LOB noted. Pt was slow cautious reciprocal gait pattern with crutches. Pt would likely ambulate better with RW however adamently refused RW stating that he felt more mobile with crutches.  Stairs            Wheelchair Mobility    Modified Rankin (Stroke Patients Only)       Balance Overall balance assessment: Needs assistance Sitting-balance support: Feet supported, No upper extremity supported Sitting balance-Leahy Scale: Good     Standing balance support: Reliant on assistive device for balance, Bilateral upper extremity supported, During functional activity Standing balance-Leahy Scale: Fair Standing balance comment: reliant on crutches. Needed to have hand on chair when transitioning from standing to sitting with crutches                             Pertinent Vitals/Pain Pain Assessment Pain Assessment: No/denies pain  Home Living Family/patient expects to be discharged to:: Private residence Living Arrangements: Spouse/significant other Available Help at Discharge: Family;Available 24 hours/day Type of Home: House Home Access: Stairs to enter Entrance Stairs-Rails: Right Entrance Stairs-Number of Steps: 1 (14  steps in the back of the home)   Home Layout: One level Home Equipment: Cane - single Barista (2 wheels);Crutches Additional Comments: Pt reports he prefers to ambulate with cructches    Prior Function Prior Level of Function : Independent/Modified Independent;Driving             Mobility Comments: uses 2 canes vs crutches to abmulate, reports driving PTA ADLs Comments: Pt reports being independent in bADLs and IADLs     Hand Dominance   Dominant Hand: Right    Extremity/Trunk Assessment   Upper Extremity Assessment Upper Extremity Assessment: Overall WFL for tasks assessed    Lower Extremity Assessment Lower Extremity Assessment: Overall WFL for tasks assessed    Cervical / Trunk Assessment Cervical / Trunk Assessment: Kyphotic  Communication   Communication: HOH  Cognition Arousal/Alertness: Awake/alert Behavior During Therapy: WFL for tasks assessed/performed Overall Cognitive Status: Difficult to assess                                          General Comments General comments (skin integrity, edema, etc.): VSS on RA    Exercises     Assessment/Plan    PT Assessment Patient needs continued PT services  PT Problem List Decreased strength;Decreased activity tolerance;Decreased balance;Decreased mobility;Decreased knowledge of use of DME;Decreased safety awareness;Cardiopulmonary status limiting activity       PT Treatment Interventions DME instruction;Gait training;Stair training;Functional mobility training;Therapeutic activities;Neuromuscular re-education;Patient/family education    PT Goals (Current goals can be found in the Care Plan section)  Acute Rehab PT Goals Patient Stated Goal: to go home PT Goal Formulation: With patient Time For Goal Achievement: 12/04/22 Potential to Achieve Goals: Good    Frequency Min 3X/week     Co-evaluation               AM-PAC PT "6 Clicks" Mobility  Outcome Measure Help  needed turning from your back to your side while in a flat bed without using bedrails?: A Little Help needed moving from lying on your back to sitting on the side of a flat bed without using bedrails?: A Little Help needed moving to and from a bed to a chair (including a wheelchair)?: None Help needed standing up from a chair using your arms (e.g., wheelchair or bedside chair)?: None Help needed to walk in hospital room?: None Help needed climbing 3-5 steps with a railing? : A Little 6 Click Score: 21    End of Session Equipment Utilized During Treatment: Gait belt Activity Tolerance: Patient tolerated treatment well Patient left: in chair;with call bell/phone within reach Nurse Communication: Mobility status PT Visit Diagnosis: Other abnormalities of gait and mobility (R26.89)    Time: [id number] PT Time Calculation (min) (ACUTE ONLY): 26 min   Charges:   PT Evaluation $PT Eval Low Complexity: 1 Low PT Treatments $Gait Training: 8-22 mins        [name], PT, DPT Acute Rehab Services [id number]   Viann Shove 11/20/2022, 1:12 PM

## 2022-11-20 NOTE — Progress Notes (Signed)
PROGRESS NOTE  Leonard Cisneros [id number] DOB: [birth year]   PCP: Alvira Monday, FNP  Patient is from: Home.  Lives with his wife.  Uses crutches at baseline.  DOA: 11/18/2022 LOS: 2  Chief complaints Shortness of breath, weight gain and edema    Brief Narrative / Interim history: 82 year old M with PMH of CAD/CABG with recent LHC showing 3v-CAD, moderate aortic stenosis, ICM, diastolic CHF, lower back pain, A-flutter s/p ablation, permanent A-fib on Eliquis, PAD, anemia and HTN presented to [facility] with shortness of breath, weight gain, edema and melena, and transferred to [facility] for GI evaluation.  Hgb was 7.8 (down from 8.6).  He was started on IV Lasix for CHF exacerbation.   On arrival here, in mild RVR to 122.  91% on RA.  Hgb 7.5.  MSP 115.  INR 1.3.  CMP is without significant finding other than mild hyponatremia.  BNP 780.  TSH normal.  Troponin 36> 37.  EKG with rate controlled A-fib.  UA with pyuria and rare bacteria.  1 unit of blood ordered.  Started on PPI, Lasix and ceftriaxone.  Cardiology and GI consulted. GI planning EGD.  Cardiology following.   Subjective: Seen and examined earlier this morning.  No major events overnight of this morning.  No complaints.  Denies chest pain, shortness of breath, GI or UTI symptoms.  Had a bowel movement this morning.  Stool looked "a little dark".  Objective: Vitals:   11/19/22 2137 11/20/22 0422 11/20/22 0743 11/20/22 1220  BP: (!) 100/59 (!) 116/59 (!) 96/50 (!) 105/55  Pulse: 92 (!) 105 (!) 112 97  Resp: 18 19 18 16   Temp: 98.8 F (37.1 C) 98 F (36.7 C) 98 F (36.7 C) 98 F (36.7 C)  TempSrc: Oral Oral Oral Oral  SpO2: 97% 96%    Weight:  95.2 kg    Height:        Examination:  GENERAL: No apparent distress.  Nontoxic. HEENT: MMM.  Vision and hearing grossly intact.  NECK: Supple.  No apparent JVD.  RESP:  No IWOB.  Fair aeration bilaterally. [name]: Irregular rhythm.  Normal rate.  2/6 SEM over RUSB and LUSB.  Heart  sounds normal.  ABD/GI/GU: BS+. Abd soft, NTND.  MSK/EXT:   No apparent deformity. Moves extremities. No edema.  Stasis dermatitis. SKIN: Stasis dermatitis in BLE. NEURO: Awake and alert. Oriented appropriately.  No apparent focal neuro deficit. PSYCH: Calm. Normal affect.   Procedures:  None  Microbiology summarized: None  Assessment and plan: Principal Problem:   SOB (shortness of breath) Active Problems:   Hx of CABG   Acute on chronic diastolic congestive heart failure (HCC)   Essential hypertension   PAD (peripheral artery disease) (HCC)   Macrocytic anemia   Obesity (BMI 30-39.9)   Ischemic cardiomyopathy   Atrial fibrillation with RVR (HCC)   Hyponatremia   Acquired thrombophilia (HCC)   History of CAD (coronary artery disease)   Elevated troponin  Acute on chronic diastolic CHF/ICM: Presented to [facility] with progressive SOB, weight gain, edema and melena.  BNP elevated to 780.  CXR with mild vascular congestion and bilateral pleural effusions.  Could be due to anemia.  Difficult to assess edema due to underlying PAD/stasis dermatitis.  Reports improvement in his breathing.  Started on IV Lasix.  I&O incomplete but net -2.5 L so far.  Cr slightly up. -Cardiology consulted-on IV Lasix 40 mg twice daily -Manage anemia as below -Strict intake and output, daily weights, renal functions and  electrolytes.   Symptomatic blood loss anemia: Baseline seems to be between 8 and 9.  Patient reports melena for 2 to 3 weeks.  On Eliquis for A-fib.  Hemoccult positive at [facility].  Anemia panel normal.  Transfused 1 unit with appropriate response. Recent Labs    09/28/22 0121 09/28/22 1831 09/29/22 0954 10/05/22 0959 11/18/22 2113 11/19/22 0021 11/19/22 1045 11/19/22 2222 11/20/22 0031 11/20/22 1029  HGB 7.4* 8.6* 8.5* 8.5* 7.5* 7.1* 8.1* 8.0* 8.0* 8.4*  -Appreciate GI recs: EGD on 3/20. -Continue holding Eliquis.  Last dose reportedly on Friday -Continue PPI -Monitor H&H.  Goal  Hgb >  8.0 given CAD  Moderate to severe aortic stenosis: Noted on TTE in 09/2022. -Per cardiology. -Followed every 6 months by cardiology.  Elevated troponin/history of CAD/CABG/3v-CAD: Denies chest pain.  Elevated troponin likely demand ischemia. -Cardiology on board -Continue Lipitor -Resume metoprolol as BP allows.  Permanent atrial fibrillation with RVR: On Toprol-XL, Cardizem and Eliquis at home.  RVR improved.  Soft blood pressures. -Holding Eliquis in the setting of melena -Resumed home Toprol at reduced dose due to soft BP -Optimize electrolytes-KCl 40 x 1  Essential hypertension: Soft blood pressures. -Continue holding home Cardizem. -Resumed home Toprol at reduced dose due to A-fib with RVR  PAD: No recent ABI.  Not able to palpate DP pulses. -Check ABI -Continue statin  Ambulatory dysfunction/chronic lower back pain: Uses crutches for ambulation -PT/OT eval  Obesity Body mass index is 29.26 kg/m.           DVT prophylaxis:  SCDs Start: 11/18/22 2020  Code Status: Full code Family Communication: None at bedside Level of care: Telemetry Cardiac Status is: Inpatient Remains inpatient appropriate because: Symptomatic anemia, CHF exacerbation and A-fib with RVR   Final disposition: TBD Consultants:  Cardiology Gastroenterology  55 minutes with more than 50% spent in reviewing records, counseling patient/family and coordinating care.   Sch Meds:  Scheduled Meds:  sodium chloride   Intravenous Once   furosemide  40 mg Intravenous BID   metoprolol succinate  25 mg Oral Daily   pantoprazole (PROTONIX) IV  40 mg Intravenous Q12H   Continuous Infusions:   PRN Meds:.acetaminophen **OR** acetaminophen, levalbuterol, ondansetron **OR** ondansetron (ZOFRAN) IV  Antimicrobials: Anti-infectives (From admission, onward)    Start     Dose/Rate Route Frequency Ordered Stop   11/19/22 2200  cefTRIAXone (ROCEPHIN) 1 g in sodium chloride 0.9 % 100 mL IVPB   Status:  Discontinued        1 g 200 mL/hr over 30 Minutes Intravenous Every 24 hours 11/18/22 2256 11/19/22 1240   11/19/22 0000  cefTRIAXone (ROCEPHIN) 1 g in sodium chloride 0.9 % 100 mL IVPB        1 g 200 mL/hr over 30 Minutes Intravenous  Once 11/18/22 2301 11/19/22 0053        I have personally reviewed the following labs and images: CBC: Recent Labs  Lab 11/18/22 2113 11/19/22 0021 11/19/22 1045 11/19/22 2222 11/20/22 0031 11/20/22 1029  WBC 7.5 7.3  --   --  6.9  --   NEUTROABS 5.5  --   --   --   --   --   HGB 7.5* 7.1* 8.1* 8.0* 8.0* 8.4*  HCT 23.2* 22.8* 25.3* 24.5* 24.9* 26.3*  MCV 114.9* 116.3*  --   --  112.7*  --   PLT 164 171  --   --  156  --    BMP &GFR Recent Labs  Lab  11/18/22 2113 11/19/22 0021 11/20/22 0031  NA 132* 131* 134*  K 4.2 4.2 3.5  CL 97* 99 98  CO2 26 26 29   GLUCOSE 97 88 95  BUN 15 13 12   CREATININE 0.78 0.77 0.92  CALCIUM 8.7* 8.5* 8.1*  MG 2.3  --  1.9  PHOS 3.6  --  3.2   Estimated Creatinine Clearance: 74.2 mL/min (by C-G formula based on SCr of 0.92 mg/dL). Liver & Pancreas: Recent Labs  Lab 11/18/22 2113 11/19/22 0021 11/20/22 0031  AST 26 24  --   ALT 26 25  --   ALKPHOS 198* 217*  --   BILITOT 3.0* 2.7*  --   PROT 5.7* 5.6*  --   ALBUMIN 3.0* 2.9* 2.9*   No results for input(s): "LIPASE", "AMYLASE" in the last 168 hours. No results for input(s): "AMMONIA" in the last 168 hours. Diabetic: Recent Labs    11/18/22 2113  HGBA1C 4.6*   No results for input(s): "GLUCAP" in the last 168 hours. Cardiac Enzymes: No results for input(s): "CKTOTAL", "CKMB", "CKMBINDEX", "TROPONINI" in the last 168 hours. No results for input(s): "PROBNP" in the last 8760 hours. Coagulation Profile: Recent Labs  Lab 11/18/22 2113  INR 1.3*   Thyroid Function Tests: Recent Labs    11/20/22 0031  TSH 3.009   Lipid Profile: No results for input(s): "CHOL", "HDL", "LDLCALC", "TRIG", "CHOLHDL", "LDLDIRECT" in the last 72  hours. Anemia Panel: Recent Labs    11/19/22 0021  VITAMINB12 545  FOLATE 8.8  FERRITIN 304  TIBC 276  IRON 76  RETICCTPCT 3.5*   Urine analysis:    Component Value Date/Time   COLORURINE YELLOW 11/19/2022 0115   APPEARANCEUR CLEAR 11/19/2022 0115   LABSPEC 1.006 11/19/2022 0115   PHURINE 6.0 11/19/2022 0115   GLUCOSEU NEGATIVE 11/19/2022 0115   HGBUR SMALL (A) 11/19/2022 0115   BILIRUBINUR NEGATIVE 11/19/2022 0115   KETONESUR NEGATIVE 11/19/2022 0115   PROTEINUR NEGATIVE 11/19/2022 0115   UROBILINOGEN 0.2 11/28/2010 0915   NITRITE NEGATIVE 11/19/2022 0115   LEUKOCYTESUR LARGE (A) 11/19/2022 0115   Sepsis Labs: Invalid input(s): "PROCALCITONIN", "LACTICIDVEN"  Microbiology: No results found for this or any previous visit (from the past 240 hour(s)).  Radiology Studies: No results found.    Phylliss Strege T. [place]  If 7PM-7AM, please contact night-coverage [email or web address] 11/20/2022, 12:23 PM

## 2022-11-20 NOTE — Consult Note (Signed)
   [facility] CM Inpatient Consult   11/20/2022  YURIY BROSEY 12/06/1940 [id number]  [facility] [place] [facility] [ACO] Patient: Leonard Cisneros Medicare   Primary Care Provider:  Alvira Monday, FNP, [facility] is listed as the provider and for transition of care follow up   Patient screened for hospitalization with noted medium risk score for unplanned readmission risk and to assess for potential [place] Management service needs for post hospital transition for care coordination.  Review of patient's electronic medical record reveals patient transferred from [facility] to [facility] for HF management needs.  Met with patient and spouse at the bedside to explain reason for the visit and they endorse PCP and has follow up appointments.  They were given an appointment reminder card and a 24 hour nurse advise line magnet. Explained that they may get follow up calls for TOC from hospitalization.  SDOH reviewed and no needs were noted. No needs stated at this time, patient and wife were generous. Will follow.  Plan:  Continue to follow progress and disposition to assess for post hospital community care coordination/management needs.  Referral request for community care coordination:  No current needs assessed.   Of note, [facility] Care Management/Population Health does not replace or interfere with any arrangements made by the Inpatient Transition of Care team.  For questions contact:   Natividad Brood, RN BSN [place]  [phone] business mobile phone Toll free office [phone]  *[place]  [phone] Fax number: [phone] [email or web address]

## 2022-11-20 NOTE — Progress Notes (Addendum)
Progress Note  Primary GI: Dr.Rourk      Subjective  Chief Complaint: Anemia, heme positive stool, complaints of dark stools   No family was present at the time of my evaluation. Patient sitting up in chair beside bed. States his breathing is improved. Had bowel movement this morning brown, denies any melena or hematochezia.  Denies abdominal pain, nausea vomiting. Patient did have breakfast this morning. Has had some lower blood pressures, systolic 0000000 to [id number].  No dizziness, chest pain, shortness of breath    Objective   Vital signs in last 24 hours: Temp:  [98 F (36.7 C)-98.8 F (37.1 C)] 98 F (36.7 C) (03/19 0743) Pulse Rate:  [92-112] 112 (03/19 0743) Resp:  [17-19] 18 (03/19 0743) BP: (96-116)/(50-59) 96/50 (03/19 0743) SpO2:  [91 %-97 %] 96 % (03/19 0422) Weight:  [95.2 kg] 95.2 kg (03/19 0422) Last BM Date : 11/18/22 Last BM recorded by nurses in past 5 days No data recorded  General:   male in no acute distress  Heart:  Irregular rhythm; no murmurs Pulm: Clear anteriorly; no wheezing, not on oxygen Abdomen:  Soft, Obese AB, Active bowel sounds. No tenderness , No organomegaly appreciated. Extremities:  without  edema. Stasis changes. Neurologic:  Alert and  oriented x4;  No focal deficits.  Psych:  Cooperative. Normal mood and affect.  Intake/Output from previous day: 03/18 0701 - 03/19 0700 In: 788.2 [P.O.:480; Blood:308.2] Out: 1750 [Urine:1750] Intake/Output this shift: Total I/O In: 120 [P.O.:120] Out: -   Studies/Results: DG Chest 2 View  Result Date: 11/19/2022 CLINICAL DATA:  Congestive failure EXAM: CHEST - 2 VIEW COMPARISON:  09/25/2022 FINDINGS: Heart is again enlarged in size. Postsurgical changes are again seen. Pleural effusions are noted right considerably greater than left. Basilar atelectasis is noted right greater than left. Mild central vascular congestion is seen without significant edema. IMPRESSION: Mild vascular congestion  without significant edema. Bilateral pleural effusions and associated atelectatic changes. Electronically Signed   By: Inez Catalina M.D.   On: 11/19/2022 11:34    Lab Results: Recent Labs    11/18/22 2113 11/19/22 0021 11/19/22 1045 11/19/22 2222 11/20/22 0031  WBC 7.5 7.3  --   --  6.9  HGB 7.5* 7.1* 8.1* 8.0* 8.0*  HCT 23.2* 22.8* 25.3* 24.5* 24.9*  PLT 164 171  --   --  156   BMET Recent Labs    11/18/22 2113 11/19/22 0021 11/20/22 0031  NA 132* 131* 134*  K 4.2 4.2 3.5  CL 97* 99 98  CO2 26 26 29   GLUCOSE 97 88 95  BUN 15 13 12   CREATININE 0.78 0.77 0.92  CALCIUM 8.7* 8.5* 8.1*   LFT Recent Labs    11/19/22 0021 11/20/22 0031  PROT 5.6*  --   ALBUMIN 2.9* 2.9*  AST 24  --   ALT 25  --   ALKPHOS 217*  --   BILITOT 2.7*  --    PT/INR Recent Labs    11/18/22 2113  LABPROT 16.0*  INR 1.3*     Scheduled Meds:  sodium chloride   Intravenous Once   furosemide  40 mg Intravenous BID   metoprolol succinate  25 mg Oral Daily   pantoprazole (PROTONIX) IV  40 mg Intravenous Q12H   potassium chloride  40 mEq Oral Once   Continuous Infusions:    Patient profile:   82 year old white male with macrocytic anemia, heme positive stool and reports of dark stool intermittently over the  past couple of weeks in setting of chronic Eliquis   Impression/Plan:   Microcytic anemia, FOBT positive stools, melena, Eliquis on hold CBC on 11/20/2022   HGB 8.0 ( 7.5 on admission s/p 1 PRBC)  MCV 112.7 Platelets 156 11/19/2022 Iron 76 Ferritin 304 B12 545 Colonoscopy 2019 normal per Dr. Gala Romney Continue PPI twice daily Patient ate this morning, will plan for EGD tomorrow.  N.p.o. midnight. Will schedule EGD to evaluate for possible H. pylori, esophagitis, gastritis, peptic ulcer disease, etc.. I discussed risks of EGD with patient today, including risk of sedation, bleeding or perforation.  Patient provides understanding and gave verbal consent to proceed.  Acute on chronic  congestive heart failure BNP on admission 2297 Getting IV Lasix, followed by cardiology, I put yesterday 1.7L Breathing status improved  CAD status post CABG, peripheral arterial disease, A-fib, moderate to severe aortic stenosis Last dose of Eliquis 11/16/2022 No chest pain, negative troponin, EKG without ACS  Chronic alcohol use January 2024 no cirrhosis on ultrasound Increase risk gastritis/PUD  Principal Problem:   SOB (shortness of breath) Active Problems:   Hx of CABG   Obesity (BMI 30-39.9)   Essential hypertension   Ischemic cardiomyopathy   PAD (peripheral artery disease) (HCC)   Atrial fibrillation with RVR (HCC)   Acute on chronic diastolic congestive heart failure (HCC)   Macrocytic anemia   Hyponatremia   Acquired thrombophilia (HCC)   History of CAD (coronary artery disease)   Elevated troponin    LOS: 2 days   Vladimir Crofts  11/20/2022, 10:02 AM   Attending physician's note   I have taken a history, reviewed the chart and examined the patient. I performed a substantive portion of this encounter, including complete performance of at least one of the key components, in conjunction with the APP. I agree with the APP's note, impression and recommendations.    Plan to proceed with EGD tomorrow to further evaluate recent episode of melena and worsening anemia Acute on chronic CHF, improving N.p.o. after midnight     K. Denzil Magnuson , MD [phone]

## 2022-11-20 NOTE — Progress Notes (Signed)
Rounding Note    Patient Name: Leonard Cisneros Date of Encounter: 11/20/2022  [place] Cardiologist: Rozann Lesches, MD   Subjective   Fairly comfortable today getting IV Protonix.  No melena.  Getting IV Lasix.  Blood pressure borderline. Feels well.   Inpatient Medications    Scheduled Meds:  sodium chloride   Intravenous Once   furosemide  40 mg Intravenous BID   metoprolol succinate  25 mg Oral Daily   pantoprazole (PROTONIX) IV  40 mg Intravenous Q12H   potassium chloride  40 mEq Oral Once   Continuous Infusions:  PRN Meds: acetaminophen **OR** acetaminophen, levalbuterol, ondansetron **OR** ondansetron (ZOFRAN) IV   Vital Signs    Vitals:   11/19/22 1507 11/19/22 2137 11/20/22 0422 11/20/22 0743  BP: (!) 112/57 (!) 100/59 (!) 116/59 (!) 96/50  Pulse: 95 92 (!) 105 (!) 112  Resp: 17 18 19 18   Temp: 98 F (36.7 C) 98.8 F (37.1 C) 98 F (36.7 C) 98 F (36.7 C)  TempSrc: Oral Oral Oral Oral  SpO2: 91% 97% 96%   Weight:   95.2 kg   Height:        Intake/Output Summary (Last 24 hours) at 11/20/2022 0850 Last data filed at 11/20/2022 0742 Gross per 24 hour  Intake 600 ml  Output 1350 ml  Net -750 ml      11/20/2022    4:22 AM 11/18/2022    6:40 PM 11/16/2022    9:18 AM  Last 3 Weights  Weight (lbs) 209 lb 12.8 oz 218 lb 14.4 oz 223 lb  Weight (kg) 95.165 kg 99.292 kg 101.152 kg      Telemetry    Permanent atrial fibrillation- Personally Reviewed  ECG    No new- Personally Reviewed  Physical Exam   GEN: No acute distress.   Neck: No JVD Cardiac: Irregularly irregular with 2/6 systolic murmur Respiratory: Clear to auscultation bilaterally. GI: Soft, nontender, non-distended  MS: No edema; No deformity. Neuro:  Nonfocal  Psych: Normal affect   Labs    High Sensitivity Troponin:   Recent Labs  Lab 11/19/22 0021 11/19/22 0213  TROPONINIHS 34* 36*     Chemistry Recent Labs  Lab 11/18/22 2113 11/19/22 0021  11/20/22 0031  NA 132* 131* 134*  K 4.2 4.2 3.5  CL 97* 99 98  CO2 26 26 29   GLUCOSE 97 88 95  BUN 15 13 12   CREATININE 0.78 0.77 0.92  CALCIUM 8.7* 8.5* 8.1*  MG 2.3  --  1.9  PROT 5.7* 5.6*  --   ALBUMIN 3.0* 2.9* 2.9*  AST 26 24  --   ALT 26 25  --   ALKPHOS 198* 217*  --   BILITOT 3.0* 2.7*  --   GFRNONAA >60 >60 >60  ANIONGAP 9 6 7     Lipids No results for input(s): "CHOL", "TRIG", "HDL", "LABVLDL", "LDLCALC", "CHOLHDL" in the last 168 hours.  Hematology Recent Labs  Lab 11/18/22 2113 11/19/22 0021 11/19/22 1045 11/19/22 2222 11/20/22 0031  WBC 7.5 7.3  --   --  6.9  RBC 2.02* 1.96*  1.93*  --   --  2.21*  HGB 7.5* 7.1* 8.1* 8.0* 8.0*  HCT 23.2* 22.8* 25.3* 24.5* 24.9*  MCV 114.9* 116.3*  --   --  112.7*  MCH 37.1* 36.2*  --   --  36.2*  MCHC 32.3 31.1  --   --  32.1  RDW 25.1* 25.0*  --   --  26.4*  PLT 164 171  --   --  156   Thyroid  Recent Labs  Lab 11/20/22 0031  TSH 3.009    BNP Recent Labs  Lab 11/18/22 2113 11/19/22 1045  BNP 779.5* 634.9*    DDimer No results for input(s): "DDIMER" in the last 168 hours.   Radiology    DG Chest 2 View  Result Date: 11/19/2022 CLINICAL DATA:  Congestive failure EXAM: CHEST - 2 VIEW COMPARISON:  09/25/2022 FINDINGS: Heart is again enlarged in size. Postsurgical changes are again seen. Pleural effusions are noted right considerably greater than left. Basilar atelectasis is noted right greater than left. Mild central vascular congestion is seen without significant edema. IMPRESSION: Mild vascular congestion without significant edema. Bilateral pleural effusions and associated atelectatic changes. Electronically Signed   By: Inez Catalina M.D.   On: 11/19/2022 11:34    Cardiac Studies   Cardiac Studies & Procedures   CARDIAC CATHETERIZATION  CARDIAC CATHETERIZATION 09/27/2022  Narrative   Ost LAD to Prox LAD lesion is 100% stenosed.   Ramus lesion is 100% stenosed.   Prox RCA to Mid RCA lesion is 60%  stenosed.   Dist RCA lesion is 90% stenosed.   Origin to Prox Graft lesion is 100% stenosed.   LIMA graft was visualized by angiography and is large.   RIMA graft was visualized by non-selective angiography and is normal in caliber.   The graft exhibits no disease.   The graft exhibits no disease.   LV end diastolic pressure is mildly elevated.   Hemodynamic findings consistent with mild pulmonary hypertension.   There is moderate aortic valve stenosis.  3 vessel occlusive CAD. The native LCx is widely patent LIMA to the LAD is patent RIMA to the PDA is patent SVG to the ramus intermediate is occluded. This appears to be chronic Mild to Moderate aortic stenosis. AV mean gradient 18.5 mm Hg. AVA 2.5 cm squared with index 1.15. Elevated LV filling pressures. LVEDP 20 mm Hg. Mean PCWP 24 mm Hg with large V waves to 42 mm Hg Mild pulmonary HTN. PAP 56/17 with mean 31 mm Hg.  Plan: medical management. Continue diuresis. Would benefit greatly from correction of severe anemia. Will need to monitor Aortic stenosis serially going forward.  Findings Coronary Findings Diagnostic  Dominance: Right  Left Main Vessel was injected. Vessel is normal in caliber. Vessel is angiographically normal.  Left Anterior Descending Ost LAD to Prox LAD lesion is 100% stenosed.  Ramus Intermedius Ramus lesion is 100% stenosed.  Left Circumflex Vessel was injected. Vessel is normal in caliber. Vessel is angiographically normal.  Right Coronary Artery Prox RCA to Mid RCA lesion is 60% stenosed. The lesion is severely calcified. Dist RCA lesion is 90% stenosed.  LIMA LIMA Graft To Mid LAD LIMA graft was visualized by angiography and is large.  The graft exhibits no disease.  RIMA RIMA Graft To RPDA RIMA graft was visualized by non-selective angiography and is normal in caliber.  The graft exhibits no disease.  Graft To Ramus Origin to Prox Graft lesion is 100% stenosed. The lesion was previously  treated using a bare metal stent over 2 years ago.  Intervention  No interventions have been documented.   CARDIAC CATHETERIZATION  CARDIAC CATHETERIZATION 02/01/2016  Narrative  Ost LAD lesion, 100% stenosed.  Ost Ramus lesion, 100% stenosed.  Prox RCA lesion, 70% stenosed.  Mid RCA lesion, 50% stenosed.  RIMA .  LIMA .  Prox LAD to Mid LAD lesion,  75% stenosed.  SVG .  Origin lesion, 99% stenosed. Post intervention, there is a 0% residual stenosis.  The left ventricular systolic function is normal.  Low normal LV function with a subtle region of distal inferior hypocontractility.  Significant 2 vessel CAD with total occlusion of the LAD and ramus intermediate at its origin; normal left circumflex coronary artery; and RCA with 70% proximal stenosis with diffuse 50% mid stenoses with a " flush and fill" phenomena seen in the distal RCA due to competitive filling the the RIMA graft.  Patent LIMA graft supplying the mid LAD.  Patent RIMA graft supplying the distal RCA.  SVG supplying the ramus intermediate vessel with a 99% near ostial subtotal stenosis with thrombus.  Successful PCI to the SVG with ultimate insertion of a 2.512 mm NirXcell bare metal stent postdilated to 2.64 mm with a 99% stenosis being reduced to 0% resumption of brisk TIMI-3 flow.  RECOMMENDATION: The patient will be on triple therapy with aspirin/Plavix/Coumadin for a month.  Coumadin will be restarted tonight and after 1 month he will will be transitioned to dual therapy.  Findings Coronary Findings Diagnostic  Dominance: Right  Left Anterior Descending  Ramus Intermedius  Right Coronary Artery  RIMA RIMA Graft To RPAV RIMA  LIMA LIMA Graft To Dist LAD LIMA  Single Graft Graft To Ramus SVG  Intervention  Origin lesion (Single Graft Graft To Ramus) PCI The pre-interventional distal flow is decreased (TIMI 2). Pre-stent angioplasty was performed. A bare metal stent was placed.  The strut is apposed. Post-stent angioplasty was performed. The post-interventional distal flow is normal (TIMI 3). The intervention was successful. No complications occurred at this lesion. Supplies used: BALLN EUPHORA RX 2.0X10; BALLN EUPHORA RX 2.25X10; STENT NIRXCELL 2.5X12; BALLN [place] EMERGE MR 2.75X8 There is a 0% residual stenosis post intervention.   STRESS TESTS  NM MYOCAR MULTI W/SPECT W 11/12/2021  Narrative CLINICAL DATA:  Chest pain.  EXAM: MYOCARDIAL IMAGING WITH SPECT (REST AND PHARMACOLOGIC-STRESS)  GATED LEFT VENTRICULAR WALL MOTION STUDY  LEFT VENTRICULAR EJECTION FRACTION  TECHNIQUE: Standard myocardial SPECT imaging was performed after resting intravenous injection of 10.4 mCi Tc-41m tetrofosmin. Subsequently, intravenous infusion of Lexiscan was performed under the supervision of the Cardiology staff. At peak effect of the drug, 31.9 mCi Tc-43m tetrofosmin was injected intravenously and standard myocardial SPECT imaging was performed. Quantitative gated imaging was also performed to evaluate left ventricular wall motion, and estimate left ventricular ejection fraction.  COMPARISON:  None.  FINDINGS: Perfusion: No decreased activity in the left ventricle on stress imaging to suggest reversible ischemia or infarction.  Wall Motion: Normal left ventricular wall motion. No left ventricular dilation.  Left Ventricular Ejection Fraction: 62 %  End diastolic volume [id number] ml  End systolic volume 53 ml  IMPRESSION: 1. No reversible ischemia or infarction.  2. Normal left ventricular wall motion.  3. Left ventricular ejection fraction 62%  4. Non invasive risk stratification*: Low  *2012 Appropriate Use Criteria for Coronary Revascularization Focused Update: J Am Coll Cardiol. N6492421. http://content.airportbarriers.com.aspx?articleid=1201161   Electronically Signed By: Abigail Miyamoto M.D. On: 11/12/2021 14:59    ECHOCARDIOGRAM  ECHOCARDIOGRAM COMPLETE 09/25/2022  Narrative ECHOCARDIOGRAM REPORT    Patient Name:   Leonard Cisneros Date of Exam: 09/25/2022 Medical Rec #:  [id number]        Height:       71.0 in Accession #:    [id number]       Weight:       223.1 lb  Date of Birth:  11-11-1940       BSA:          2.209 m Patient Age:    51 years         BP:           111/54 mmHg Patient Gender: M                HR:           74 bpm. Exam Location:  [facility]  Procedure: 2D Echo, Cardiac Doppler and Color Doppler  Indications:    CHF  History:        Patient has prior history of Echocardiogram examinations, most recent 11/12/2021. CHF and Cardiomyopathy, CAD, Prior CABG, PAD and Pulmonary HTN, Arrythmias:Atrial Fibrillation, Signs/Symptoms:Chest Pain; Risk Factors:Hypertension and Dyslipidemia. ETOH abuse.  Sonographer:    Wenda Low Referring Phys: [place]   1. Left ventricular ejection fraction, by estimation, is 55 to 60%. The left ventricle has normal function. The left ventricle has no regional wall motion abnormalities. There is moderate concentric left ventricular hypertrophy. Left ventricular diastolic parameters are indeterminate. 2. Right ventricular systolic function is normal. The right ventricular size is mildly enlarged. Tricuspid regurgitation signal is inadequate for assessing PA pressure. 3. Left atrial size was severely dilated. 4. Right atrial size was mildly dilated. 5. Left pleural effusion noted. 6. The mitral valve is degenerative. Mild mitral valve regurgitation. Severe mitral annular calcification. 7. The aortic valve is tricuspid. There is moderate calcification of the aortic valve. Aortic valve regurgitation is not visualized. Moderate to severe aortic valve stenosis. Aortic valve mean gradient measures 24.0 mmHg. Dimentionless index 0.34. 8. Aortic dilatation noted. There is mild dilatation of the ascending aorta, measuring  39 mm. 9. The inferior vena cava is dilated in size with <50% respiratory variability, suggesting right atrial pressure of 15 mmHg.  Comparison(s): Prior images reviewed side by side. LVEF 55-60% range. Degenerative, calcific aortic stenosis of moderate to severe range with similar gradients as prior study.  FINDINGS Left Ventricle: Left ventricular ejection fraction, by estimation, is 55 to 60%. The left ventricle has normal function. The left ventricle has no regional wall motion abnormalities. The left ventricular internal cavity size was normal in size. There is moderate concentric left ventricular hypertrophy. Left ventricular diastolic function could not be evaluated due to atrial fibrillation. Left ventricular diastolic parameters are indeterminate.  Right Ventricle: The right ventricular size is mildly enlarged. No increase in right ventricular wall thickness. Right ventricular systolic function is normal. Tricuspid regurgitation signal is inadequate for assessing PA pressure.  Left Atrium: Left atrial size was severely dilated.  Right Atrium: Right atrial size was mildly dilated.  Pericardium: Left pleural effusion noted. There is no evidence of pericardial effusion.  Mitral Valve: The mitral valve is degenerative in appearance. Severe mitral annular calcification. Mild mitral valve regurgitation. MV peak gradient, 15.8 mmHg. The mean mitral valve gradient is 4.0 mmHg.  Tricuspid Valve: The tricuspid valve is grossly normal. Tricuspid valve regurgitation is mild.  Aortic Valve: The aortic valve is tricuspid. There is moderate calcification of the aortic valve. There is mild to moderate aortic valve annular calcification. Aortic valve regurgitation is not visualized. Moderate to severe aortic stenosis is present. Aortic valve mean gradient measures 24.0 mmHg. Aortic valve peak gradient measures 42.1 mmHg. Aortic valve area, by VTI measures 1.06 cm.  Pulmonic Valve: The pulmonic valve  was grossly normal. Pulmonic valve regurgitation is trivial.  Aorta:  The aortic root is normal in size and structure and aortic dilatation noted. There is mild dilatation of the ascending aorta, measuring 39 mm.  Venous: The inferior vena cava is dilated in size with less than 50% respiratory variability, suggesting right atrial pressure of 15 mmHg.  IAS/Shunts: No atrial level shunt detected by color flow Doppler.  Additional Comments: There is pleural effusion in the left lateral region.   LEFT VENTRICLE PLAX 2D LVIDd:         5.50 cm   Diastology LVIDs:         3.90 cm   LV e' lateral:   12.40 cm/s LV PW:         1.30 cm   LV E/e' lateral: 14.1 LV IVS:        1.40 cm LVOT diam:     2.00 cm LV SV:         79 LV SV Index:   36 LVOT Area:     3.14 cm   RIGHT VENTRICLE RV Basal diam:  4.35 cm RV Mid diam:    3.60 cm RV S prime:     11.70 cm/s TAPSE (M-mode): 2.2 cm  LEFT ATRIUM              Index        RIGHT ATRIUM           Index LA diam:        5.50 cm  2.49 cm/m   RA Area:     26.20 cm LA Vol (A2C):   154.0 ml 69.72 ml/m  RA Volume:   85.70 ml  38.80 ml/m LA Vol (A4C):   188.0 ml 85.11 ml/m LA Biplane Vol: 176.0 ml 79.68 ml/m AORTIC VALVE                     PULMONIC VALVE AV Area (Vmax):    1.13 cm      PV Vmax:       1.22 m/s AV Area (Vmean):   1.05 cm      PV Peak grad:  6.0 mmHg AV Area (VTI):     1.06 cm AV Vmax:           324.50 cm/s AV Vmean:          222.500 cm/s AV VTI:            0.751 m AV Peak Grad:      42.1 mmHg AV Mean Grad:      24.0 mmHg LVOT Vmax:         116.33 cm/s LVOT Vmean:        74.533 cm/s LVOT VTI:          0.253 m LVOT/AV VTI ratio: 0.34  AORTA Ao Root diam: 3.60 cm Ao Asc diam:  3.90 cm  MITRAL VALVE MV Area (PHT): 4.83 cm     SHUNTS MV Area VTI:   2.08 cm     Systemic VTI:  0.25 m MV Peak grad:  15.8 mmHg    Systemic Diam: 2.00 cm MV Mean grad:  4.0 mmHg MV Vmax:       1.99 m/s MV Vmean:      82.4 cm/s MV Decel  Time: 157 msec MV E velocity: 175.00 cm/s  Rozann Lesches MD Electronically signed by Rozann Lesches MD Signature Date/Time: 09/25/2022/12:35:26 PM    Final              Patient Profile  82 y.o. 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 Cyndia Skeeters   Assessment & Plan    Acute on chronic diastolic heart failure  - he denied worsening chronic SOB, leg edema today, this was reported by admission provider.  BNP 779 on admit - dry weight is around 210ibs based on records, was 223 on 11/15/21 - on torsemide 80mg  daily, when questioned, he was not aware what this is, ? compliance  -Continue IV Lasix 40mg  BID (hold if SBP <100), track intake and output, daily weight.  Output 1.7 L yesterday. - no significant iron deficiency, agree with GI workup rule out bleeding, hemoglobin 8. - GDMT: A outpatient on metoprolol XL 100mg  daily but we reduced to 25mg  due to BP, holding aldactone 12.5mg  daily given borderline low BP, may add farixga 10mg  daily before discharge    Permanent A fib Hx of atrial flutter s/p remote ablation  - hold rate control agents metoprolol and diltiazem, resume when BP is stable  - agree holding Eliquis given + FOBT and anemia, pending EGD and GI clearance for anticoagulation.  Okay to proceed with EGD or colonoscopy if necessary. Heart rate is 90-112. Reasonable, on Toprol reduced dose 25mg  from 100 due to BP.    CAD with hx of CABG 2003  and BMS to SVG to ramus 2017 - no chest pain - Hs trop 34>36 - EKG non-specific  - last LHC 09/27/22 stable CAD, continue medical therapy      Moderate Aortic Stenosis - Mild to Moderate aortic stenosis. AV mean gradient 18.5 mm Hg. AVA 2.5 cm squared with index  1.15. - Followed every 6 months with Dr Domenic Polite    Suspected GI bleed Anemia  ETOH use PAD - per primary team    For questions or updates, please contact [place] Please consult [email or web address] for contact info under       Signed, Candee Furbish, MD  11/20/2022, 8:50 AM

## 2022-11-20 NOTE — Progress Notes (Signed)
Mobility Specialist - Progress Note   11/20/22 1616  Mobility  Activity Ambulated with assistance in hallway  Level of Assistance Standby assist, set-up cues, supervision of patient - no hands on  Assistive Device Crutches  Distance Ambulated (ft) 150 ft  Activity Response Tolerated well  Mobility Referral Yes  $Mobility charge 1 Mobility    Pt received in recliner and agreeable. No complaints on walk. Left in recliner w/ call bell in reach and all needs met.   [place] Specialist Please contact via SecureChat or Rehab office at [phone]

## 2022-11-21 ENCOUNTER — Encounter (HOSPITAL_COMMUNITY)
Admission: RE | Disposition: A | Discharge status: Home Health | Payer: Self-pay | Source: Other Acute Inpatient Hospital | Attending: Student

## 2022-11-21 ENCOUNTER — Encounter (HOSPITAL_COMMUNITY): Payer: Self-pay | Admitting: Internal Medicine

## 2022-11-21 ENCOUNTER — Inpatient Hospital Stay (HOSPITAL_COMMUNITY): Payer: Medicare HMO | Admitting: Certified Registered Nurse Anesthetist

## 2022-11-21 DIAGNOSIS — R0602 Shortness of breath: Secondary | ICD-10-CM | POA: Diagnosis not present

## 2022-11-21 DIAGNOSIS — K3189 Other diseases of stomach and duodenum: Secondary | ICD-10-CM

## 2022-11-21 DIAGNOSIS — K269 Duodenal ulcer, unspecified as acute or chronic, without hemorrhage or perforation: Secondary | ICD-10-CM

## 2022-11-21 DIAGNOSIS — K317 Polyp of stomach and duodenum: Secondary | ICD-10-CM

## 2022-11-21 DIAGNOSIS — K921 Melena: Secondary | ICD-10-CM | POA: Diagnosis not present

## 2022-11-21 DIAGNOSIS — K2990 Gastroduodenitis, unspecified, without bleeding: Secondary | ICD-10-CM

## 2022-11-21 DIAGNOSIS — K2970 Gastritis, unspecified, without bleeding: Secondary | ICD-10-CM | POA: Diagnosis not present

## 2022-11-21 DIAGNOSIS — I509 Heart failure, unspecified: Secondary | ICD-10-CM

## 2022-11-21 DIAGNOSIS — D500 Iron deficiency anemia secondary to blood loss (chronic): Secondary | ICD-10-CM

## 2022-11-21 DIAGNOSIS — I25119 Atherosclerotic heart disease of native coronary artery with unspecified angina pectoris: Secondary | ICD-10-CM

## 2022-11-21 DIAGNOSIS — I110 Hypertensive heart disease with heart failure: Secondary | ICD-10-CM

## 2022-11-21 DIAGNOSIS — Z87891 Personal history of nicotine dependence: Secondary | ICD-10-CM

## 2022-11-21 HISTORY — PX: BIOPSY: SHX5522

## 2022-11-21 HISTORY — PX: ESOPHAGOGASTRODUODENOSCOPY (EGD) WITH PROPOFOL: SHX5813

## 2022-11-21 LAB — RENAL FUNCTION PANEL
Albumin: 3 g/dL — ABNORMAL LOW (ref 3.5–5.0)
Anion gap: 6 (ref 5–15)
BUN: 13 mg/dL (ref 8–23)
CO2: 29 mmol/L (ref 22–32)
Calcium: 8.2 mg/dL — ABNORMAL LOW (ref 8.9–10.3)
Chloride: 101 mmol/L (ref 98–111)
Creatinine, Ser: 0.86 mg/dL (ref 0.61–1.24)
GFR, Estimated: >60 mL/min (ref >60)
Glucose, Bld: 93 mg/dL (ref 70–99)
Phosphorus: 2.7 mg/dL (ref 2.5–4.6)
Potassium: 3.4 mmol/L — ABNORMAL LOW (ref 3.5–5.1)
Sodium: 136 mmol/L (ref 135–145)

## 2022-11-21 LAB — HEMOGLOBIN AND HEMATOCRIT, BLOOD
HCT: 28.5 % — ABNORMAL LOW (ref 39.0–52.0)
HCT: 25 % — ABNORMAL LOW (ref 39.0–52.0)
Hemoglobin: 8.8 g/dL — ABNORMAL LOW (ref 13.0–17.0)
Hemoglobin: 8.2 g/dL — ABNORMAL LOW (ref 13.0–17.0)

## 2022-11-21 LAB — CBC
HCT: 24.5 % — ABNORMAL LOW (ref 39.0–52.0)
Hemoglobin: 8.1 g/dL — ABNORMAL LOW (ref 13.0–17.0)
MCH: 36.8 pg — ABNORMAL HIGH (ref 26.0–34.0)
MCHC: 33.1 g/dL (ref 30.0–36.0)
MCV: 111.4 fL — ABNORMAL HIGH (ref 80.0–100.0)
Platelets: 157 10*3/uL (ref 150–400)
RBC: 2.2 MIL/uL — ABNORMAL LOW (ref 4.22–5.81)
RDW: 25.6 % — ABNORMAL HIGH (ref 11.5–15.5)
WBC: 6 10*3/uL (ref 4.0–10.5)
nRBC: 0 % (ref 0.0–0.2)

## 2022-11-21 LAB — MAGNESIUM: Magnesium: 1.8 mg/dL (ref 1.7–2.4)

## 2022-11-21 LAB — GLUCOSE, CAPILLARY: Glucose-Capillary: 111 mg/dL — ABNORMAL HIGH (ref 70–99)

## 2022-11-21 SURGERY — ESOPHAGOGASTRODUODENOSCOPY (EGD) WITH PROPOFOL
Anesthesia: Monitor Anesthesia Care

## 2022-11-21 MED ORDER — LIDOCAINE 2% (20 MG/ML) 5 ML SYRINGE
INTRAMUSCULAR | Status: DC | PRN
  Administered 2022-11-21 (×2): 40 mg via INTRAVENOUS

## 2022-11-21 MED ORDER — LACTATED RINGERS IV SOLN: INTRAVENOUS | Status: DC | PRN

## 2022-11-21 MED ORDER — PHENYLEPHRINE 80 MCG/ML (10ML) SYRINGE FOR IV PUSH (FOR BLOOD PRESSURE SUPPORT)
PREFILLED_SYRINGE | INTRAVENOUS | Status: DC | PRN
  Administered 2022-11-21: 80 ug via INTRAVENOUS

## 2022-11-21 MED ORDER — POTASSIUM CHLORIDE 10 MEQ/100ML IV SOLN
10.0000 meq | INTRAVENOUS | Status: DC
  Administered 2022-11-21 (×3): 10 meq via INTRAVENOUS
  Filled 2022-11-21 (×4): qty 100

## 2022-11-21 MED ORDER — SODIUM CHLORIDE 0.9 % IV SOLN: INTRAVENOUS | Status: DC

## 2022-11-21 MED ORDER — PROPOFOL 500 MG/50ML IV EMUL
INTRAVENOUS | Status: DC | PRN
  Administered 2022-11-21: 100 ug/kg/min via INTRAVENOUS

## 2022-11-21 MED ORDER — POTASSIUM CHLORIDE CRYS ER 20 MEQ PO TBCR
40.0000 meq | EXTENDED_RELEASE_TABLET | Freq: Once | ORAL | Status: AC
  Administered 2022-11-21: 40 meq via ORAL
  Filled 2022-11-21: qty 2

## 2022-11-21 MED ORDER — PROPOFOL 10 MG/ML IV BOLUS
INTRAVENOUS | Status: DC | PRN
  Administered 2022-11-21 (×3): 10 mg via INTRAVENOUS

## 2022-11-21 SURGICAL SUPPLY — 15 items

## 2022-11-21 NOTE — Anesthesia Preprocedure Evaluation (Addendum)
Anesthesia Evaluation  Patient identified by MRN, date of birth, ID band Patient awake    Reviewed: Allergy & Precautions, NPO status , [name] Chart, lab work & pertinent test results  History of Anesthesia Complications Negative for: history of anesthetic complications  Airway Mallampati: III  TM Distance: >3 FB Neck ROM: Full    Dental  (+) Dental Advisory Given   Pulmonary neg COPD, neg recent URI, former smoker   breath sounds clear to auscultation       Cardiovascular hypertension, Pt. on medications and Pt. on home beta blockers + angina  + CAD, + Peripheral Vascular Disease and +CHF  + Valvular Problems/Murmurs AS  Rhythm:Regular  1. Left ventricular ejection fraction, by estimation, is 55 to 60%. The  left ventricle has normal function. The left ventricle has no regional  wall motion abnormalities. There is moderate concentric left ventricular  hypertrophy. Left ventricular  diastolic parameters are indeterminate.   2. Right ventricular systolic function is normal. The right ventricular  size is mildly enlarged. Tricuspid regurgitation signal is inadequate for  assessing PA pressure.   3. Left atrial size was severely dilated.   4. Right atrial size was mildly dilated.   5. Left pleural effusion noted.   6. The mitral valve is degenerative. Mild mitral valve regurgitation.  Severe mitral annular calcification.   7. The aortic valve is tricuspid. There is moderate calcification of the  aortic valve. Aortic valve regurgitation is not visualized. Moderate to  severe aortic valve stenosis. Aortic valve mean gradient measures 24.0  mmHg. Dimentionless index 0.34.   8. Aortic dilatation noted. There is mild dilatation of the ascending  aorta, measuring 39 mm.   9. The inferior vena cava is dilated in size with <50% respiratory  variability, suggesting right atrial pressure of 15 mmHg.    Blood pressure demonstrated a  hypertensive response to exercise.  The perfusion portion of the study is normal.  This is an intermediate risk study due to EKG changes.  There was 1-51mm of horiztonal ST segment depression in the inferolateral leads at peak exercise which could be due to subendocardial ischemia from hypertensive BP response to exercise.  Study was not gated due to underlying atrial fibrillation.     Neuro/Psych  Neuromuscular disease    GI/Hepatic Neg liver ROS,,,? GI bleed   Endo/Other  negative endocrine ROS    Renal/GU negative Renal ROSLab Results      Component                Value               Date                      CREATININE               0.86                11/21/2022                Musculoskeletal negative musculoskeletal ROS (+)    Abdominal   Peds  Hematology  (+) Blood dyscrasia, anemia Lab Results      Component                Value               Date                      WBC  6.0                 11/21/2022                HGB                      8.1 (L)             11/21/2022                HCT                      24.5 (L)            11/21/2022                MCV                      111.4 (H)           11/21/2022                PLT                      157                 11/21/2022              Anesthesia Other Findings  Ost LAD to Prox LAD lesion is 100% stenosed.   Ramus lesion is 100% stenosed.   Prox RCA to Mid RCA lesion is 60% stenosed.   Dist RCA lesion is 90% stenosed.   Origin to Prox Graft lesion is 100% stenosed.   LIMA graft was visualized by angiography and is large.   RIMA graft was visualized by non-selective angiography and is normal in caliber.   The graft exhibits no disease.   The graft exhibits no disease.   LV end diastolic pressure is mildly elevated.   Hemodynamic findings consistent with mild pulmonary hypertension.   There is moderate aortic valve stenosis.   1. 3 vessel occlusive CAD. 2. The  native LCx is widely patent 3. LIMA to the LAD is patent 4. RIMA to the PDA is patent 5. SVG to the ramus intermediate is occluded. This appears to be chronic 6. Mild to Moderate aortic stenosis. AV mean gradient 18.5 mm Hg. AVA 2.5 cm squared with index 1.15. 7. Elevated LV filling pressures. LVEDP 20 mm Hg. Mean PCWP 24 mm Hg with large V waves to 42 mm Hg 8. Mild pulmonary HTN. PAP 56/17 with mean 31 mm Hg.    Reproductive/Obstetrics                             Anesthesia Physical Anesthesia Plan  ASA: 4  Anesthesia Plan: MAC   Post-op Pain Management: Minimal or no pain anticipated   Induction: Intravenous  PONV Risk Score and Plan: 1 and Propofol infusion and Treatment may vary due to age or medical condition  Airway Management Planned: Nasal Cannula and Natural Airway  Additional Equipment: None  Intra-op Plan:   Post-operative Plan:   Informed Consent: I have reviewed the patients History and Physical, chart, labs and discussed the procedure including the risks, benefits and alternatives for the proposed anesthesia with the patient or authorized representative who has indicated his/her understanding and acceptance.     Dental advisory given  Plan Discussed with: CRNA  Anesthesia Plan  Comments:        Anesthesia Quick Evaluation

## 2022-11-21 NOTE — Anesthesia Procedure Notes (Signed)
Procedure Name: MAC Date/Time: 11/21/2022 11:56 AM  Performed by: Janene Harvey, CRNAPre-anesthesia Checklist: Patient identified, Emergency Drugs available, Suction available and Patient being monitored Patient Re-evaluated:Patient Re-evaluated prior to induction Oxygen Delivery Method: Nasal cannula Induction Type: IV induction Placement Confirmation: positive ETCO2 Dental Injury: Teeth and Oropharynx as per pre-operative assessment

## 2022-11-21 NOTE — Plan of Care (Signed)

## 2022-11-21 NOTE — Progress Notes (Signed)
PROGRESS NOTE  Leonard Cisneros [id number] DOB: 21-Sep-1940   PCP: Alvira Monday, FNP  Patient is from: Home.  Lives with his wife.  Uses crutches at baseline.  DOA: 11/18/2022 LOS: 3  Chief complaints Shortness of breath, weight gain and edema    Brief Narrative / Interim history: 82 year old M with PMH of CAD/CABG with recent LHC showing 3v-CAD, moderate aortic stenosis, ICM, diastolic CHF, lower back pain, A-flutter s/p ablation, permanent A-fib on Eliquis, PAD, anemia and HTN presented to [facility] with shortness of breath, weight gain, edema and melena, and transferred to [facility] for GI evaluation.  Hgb was 7.8 (down from 8.6).  He was started on IV Lasix for CHF exacerbation.   On arrival here, in mild RVR to 122.  91% on RA.  Hgb 7.5.  MSP 115.  INR 1.3.  CMP is without significant finding other than mild hyponatremia.  BNP 780.  TSH normal.  Troponin 36> 37.  EKG with rate controlled A-fib.  UA with pyuria and rare bacteria.  1 unit of blood ordered.  Started on PPI, Lasix and ceftriaxone.  Cardiology and GI consulted.  EGD with gastritis (biopsied), nonbleeding DU with clean ulcer base (Forrest class III) and mucosal nodule in duodenum (biopsied).  GI recommended IV Protonix twice daily during hospitalization and discharged on p.o. twice daily for 3 months, holding Eliquis for 3 days and following H. pylori testing.  Cardiology following for CHF.  Subjective: Seen and examined earlier this morning.  No major events overnight of this morning.  Not sure if he had a bowel movement yesterday.  No complaints.  Objective: Vitals:   11/21/22 0751 11/21/22 1026 11/21/22 1230 11/21/22 1245  BP: (!) 96/47 119/61 (!) 94/51 105/60  Pulse:  77 83 89  Resp: 18 (!) 25 (!) 22 (!) 28  Temp: 97.9 F (36.6 C) (!) 97.4 F (36.3 C) 97.9 F (36.6 C)   TempSrc: Oral Temporal    SpO2: 96% 98% 98% 97%  Weight:      Height:        Examination:  GENERAL: No apparent distress.  Nontoxic. HEENT:  MMM.  Vision and hearing grossly intact.  NECK: Supple.  No apparent JVD.  RESP:  No IWOB.  Fair aeration bilaterally. [name]: Irregular rhythm.  Normal rate.  2/6 SEM over RUSB and LUSB.   ABD/GI/GU: BS+. Abd soft, NTND.  MSK/EXT:   No apparent deformity. Moves extremities. No edema.  SKIN: Chronic skin changes from edema. NEURO: Awake and alert. Oriented appropriately.  No apparent focal neuro deficit. PSYCH: Calm. Normal affect.   Procedures:  3/20-EGD with gastritis (biopsied), nonbleeding DU with clean ulcer base (Forrest class III) and mucosal nodule in duodenum (biopsied).  GI recommended IV Protonix twice daily during hospitalization and discharged on p.o. twice daily for 3 months, holding Eliquis for 3 days and following H. pylori testing.  Microbiology summarized: None  Assessment and plan: Principal Problem:   SOB (shortness of breath) Active Problems:   Hx of CABG   Acute on chronic diastolic congestive heart failure (HCC)   Essential hypertension   PAD (peripheral artery disease) (HCC)   Macrocytic anemia   Obesity (BMI 30-39.9)   Ischemic cardiomyopathy   Atrial fibrillation with RVR (HCC)   Hyponatremia   Acquired thrombophilia (HCC)   History of CAD (coronary artery disease)   Elevated troponin   Heme positive stool   Melena   Anemia due to chronic blood loss   Duodenal ulcer   Gastritis  and gastroduodenitis  Acute on chronic diastolic CHF/ICM: Presented to [facility] with progressive SOB, weight gain, edema and melena.  BNP elevated to 780.  CXR with mild vascular congestion and bilateral pleural effusions.  Could be due to anemia.  Difficult to assess edema due to underlying PAD/stasis dermatitis.  Reports improvement in his breathing.  Started on IV Lasix.  I&O incomplete.  Net 1 point LAD.  Cr stable but BP soft. -Cardiology following. -GDMT-Toprol-XL at reduced dose due to soft blood. -Manage anemia as below -Strict intake and output, daily weights, renal  functions and electrolytes.   Symptomatic blood loss anemia: Baseline seems to be between 8 and 9.  Patient reports melena for 2 to 3 weeks.  On Eliquis for A-fib.  Hemoccult positive at [facility].  Anemia panel normal.  H&H stable after 1 unit.  EGD showed gastritis, nonbleeding DU and duodenal polyps. Recent Labs    10/05/22 0959 11/18/22 2113 11/19/22 0021 11/19/22 1045 11/19/22 2222 11/20/22 0031 11/20/22 1029 11/20/22 2226 11/21/22 0046 11/21/22 1327  HGB 8.5* 7.5* 7.1* 8.1* 8.0* 8.0* 8.4* 8.3* 8.1* 8.8*  -Appreciate GI recs: -IV PPI twice daily in-house followed by p.o. and discharged for 3 months -GI recommended holding Eliquis for 3 days. -Monitor H&H.  Goal Hgb >  8.0 given CAD  Moderate to severe aortic stenosis: Noted on TTE in 09/2022. -Per cardiology. -Followed every 6 months by cardiology.  Elevated troponin/history of CAD/CABG/3v-CAD: Denies chest pain.  Elevated troponin likely demand ischemia. -Cardiology on board -Continue Lipitor -On low-dose med prolonged due to soft blood pressure  Permanent atrial fibrillation with RVR: On Toprol-XL, Cardizem and Eliquis at home.  RVR improved.  Soft blood pressures. -Holding Eliquis in the setting of melena -Resumed home Toprol at reduced dose due to soft BP -Optimize electrolytes-KCl 40 x 1  Essential hypertension: Soft blood pressures. -Continue holding home Cardizem. -Resumed home Toprol at reduced dose due to A-fib with RVR  PAD: No recent ABI.  Not able to palpate DP pulses. -Check ABI -Continue statin  Hypokalemia -IV KCl 10 mill equivalent x 3 while NPO -Additional p.o. KCl 40  Ambulatory dysfunction/chronic lower back pain: Uses crutches for ambulation -PT/OT eval  Obesity Body mass index is 28.61 kg/m.           DVT prophylaxis:  SCDs Start: 11/18/22 2020  Code Status: Full code Family Communication: None at bedside Level of care: Telemetry Cardiac Status is: Inpatient Remains inpatient  appropriate because: Symptomatic anemia, CHF exacerbation and A-fib with RVR   Final disposition: TBD Consultants:  Cardiology Gastroenterology  35 minutes with more than 50% spent in reviewing records, counseling patient/family and coordinating care.   Sch Meds:  Scheduled Meds:  sodium chloride   Intravenous Once   metoprolol succinate  25 mg Oral Daily   pantoprazole (PROTONIX) IV  40 mg Intravenous Q12H   Continuous Infusions:   PRN Meds:.acetaminophen **OR** acetaminophen, levalbuterol, ondansetron **OR** ondansetron (ZOFRAN) IV  Antimicrobials: Anti-infectives (From admission, onward)    Start     Dose/Rate Route Frequency Ordered Stop   11/19/22 2200  cefTRIAXone (ROCEPHIN) 1 g in sodium chloride 0.9 % 100 mL IVPB  Status:  Discontinued        1 g 200 mL/hr over 30 Minutes Intravenous Every 24 hours 11/18/22 2256 11/19/22 1240   11/19/22 0000  cefTRIAXone (ROCEPHIN) 1 g in sodium chloride 0.9 % 100 mL IVPB        1 g 200 mL/hr over 30 Minutes Intravenous  Once 11/18/22 2301 11/19/22 0053        I have personally reviewed the following labs and images: CBC: Recent Labs  Lab 11/18/22 2113 11/19/22 0021 11/19/22 1045 11/20/22 0031 11/20/22 1029 11/20/22 2226 11/21/22 0046 11/21/22 1327  WBC 7.5 7.3  --  6.9  --   --  6.0  --   NEUTROABS 5.5  --   --   --   --   --   --   --   HGB 7.5* 7.1*   < > 8.0* 8.4* 8.3* 8.1* 8.8*  HCT 23.2* 22.8*   < > 24.9* 26.3* 25.3* 24.5* 28.5*  MCV 114.9* 116.3*  --  112.7*  --   --  111.4*  --   PLT 164 171  --  156  --   --  157  --    < > = values in this interval not displayed.   BMP &GFR Recent Labs  Lab 11/18/22 2113 11/19/22 0021 11/20/22 0031 11/21/22 0046  NA 132* 131* 134* 136  K 4.2 4.2 3.5 3.4*  CL 97* 99 98 101  CO2 26 26 29 29   GLUCOSE 97 88 95 93  BUN 15 13 12 13   CREATININE 0.78 0.77 0.92 0.86  CALCIUM 8.7* 8.5* 8.1* 8.2*  MG 2.3  --  1.9 1.8  PHOS 3.6  --  3.2 2.7   Estimated Creatinine  Clearance: 78.5 mL/min (by C-G formula based on SCr of 0.86 mg/dL). Liver & Pancreas: Recent Labs  Lab 11/18/22 2113 11/19/22 0021 11/20/22 0031 11/21/22 0046  AST 26 24  --   --   ALT 26 25  --   --   ALKPHOS 198* 217*  --   --   BILITOT 3.0* 2.7*  --   --   PROT 5.7* 5.6*  --   --   ALBUMIN 3.0* 2.9* 2.9* 3.0*   No results for input(s): "LIPASE", "AMYLASE" in the last 168 hours. No results for input(s): "AMMONIA" in the last 168 hours. Diabetic: Recent Labs    11/18/22 2113  HGBA1C 4.6*   No results for input(s): "GLUCAP" in the last 168 hours. Cardiac Enzymes: No results for input(s): "CKTOTAL", "CKMB", "CKMBINDEX", "TROPONINI" in the last 168 hours. No results for input(s): "PROBNP" in the last 8760 hours. Coagulation Profile: Recent Labs  Lab 11/18/22 2113  INR 1.3*   Thyroid Function Tests: Recent Labs    11/20/22 0031  TSH 3.009   Lipid Profile: No results for input(s): "CHOL", "HDL", "LDLCALC", "TRIG", "CHOLHDL", "LDLDIRECT" in the last 72 hours. Anemia Panel: Recent Labs    11/19/22 0021  VITAMINB12 545  FOLATE 8.8  FERRITIN 304  TIBC 276  IRON 76  RETICCTPCT 3.5*   Urine analysis:    Component Value Date/Time   COLORURINE YELLOW 11/19/2022 0115   APPEARANCEUR CLEAR 11/19/2022 0115   LABSPEC 1.006 11/19/2022 0115   PHURINE 6.0 11/19/2022 0115   GLUCOSEU NEGATIVE 11/19/2022 0115   HGBUR SMALL (A) 11/19/2022 0115   BILIRUBINUR NEGATIVE 11/19/2022 0115   KETONESUR NEGATIVE 11/19/2022 0115   PROTEINUR NEGATIVE 11/19/2022 0115   UROBILINOGEN 0.2 11/28/2010 0915   NITRITE NEGATIVE 11/19/2022 0115   LEUKOCYTESUR LARGE (A) 11/19/2022 0115   Sepsis Labs: Invalid input(s): "PROCALCITONIN", "LACTICIDVEN"  Microbiology: No results found for this or any previous visit (from the past 240 hour(s)).  Radiology Studies: VAS [place] ABI WITH/WO TBI  Result Date: 11/20/2022  LOWER EXTREMITY DOPPLER STUDY Patient Name:  Leonard Cisneros  Date of Exam:    11/20/2022 Medical Rec #: [id number]         Accession #:    [id number] Date of Birth: 10-19-1940        Patient Gender: M Patient Age:   47 years Exam Location:  [facility] Procedure:      VAS [place] ABI WITH/WO TBI Referring Phys: Kasidi Shanker --------------------------------------------------------------------------------  High Risk Factors: Hypertension, hyperlipidemia, coronary artery disease.  Comparison Study: Prior study 04/13/2019 vessels non-compressible. Performing Technologist: McKayla Maag RVT, [place]  Examination Guidelines: A complete evaluation includes at minimum, Doppler waveform signals and systolic blood pressure reading at the level of bilateral brachial, anterior tibial, and posterior tibial arteries, when vessel segments are accessible. Bilateral testing is considered an integral part of a complete examination. Photoelectric Plethysmograph (PPG) waveforms and toe systolic pressure readings are included as required and additional duplex testing as needed. Limited examinations for reoccurring indications may be performed as noted.  ABI Findings: +---------+------------------+-----+----------+--------+ Right    Rt Pressure (mmHg)IndexWaveform  Comment  +---------+------------------+-----+----------+--------+ Brachial 108                    triphasic          +---------+------------------+-----+----------+--------+ PTA      255               2.36 monophasic         +---------+------------------+-----+----------+--------+ DP       255               2.36 monophasic         +---------+------------------+-----+----------+--------+ Great Toe40                0.37 Abnormal           +---------+------------------+-----+----------+--------+ +---------+------------------+-----+----------+--------------------------------+ Left     Lt Pressure (mmHg)IndexWaveform  Comment                          +---------+------------------+-----+----------+--------------------------------+  Brachial                        triphasic No pressure obtained due to IV                                             placement.                       +---------+------------------+-----+----------+--------------------------------+ PTA      255               2.36 monophasic                                 +---------+------------------+-----+----------+--------------------------------+ DP       255               2.36 monophasic                                 +---------+------------------+-----+----------+--------------------------------+ Great Toe39                0.36 Abnormal                                   +---------+------------------+-----+----------+--------------------------------+ +-------+----------------+-----------+------------+------------+  ABI/TBIToday's ABI     Today's TBIPrevious ABIPrevious TBI +-------+----------------+-----------+------------+------------+ Right  non-compressible0.37       1.77        0.51         +-------+----------------+-----------+------------+------------+ Left   non-compressible0.36       1.80        0.26         +-------+----------------+-----------+------------+------------+ Arterial wall calcification precludes accurate ankle pressures and ABIs. Right ABIs appear essentially unchanged. Left ABIs appear essentially unchanged.  Summary: Right: Resting right ankle-brachial index indicates noncompressible right lower extremity arteries. The right toe-brachial index is abnormal. Left: Resting left ankle-brachial index indicates noncompressible left lower extremity arteries. The left toe-brachial index is abnormal. *See table(s) above for measurements and observations.  Electronically signed by Deitra Mayo MD on 11/20/2022 at 4:40:14 PM.    Final       Chales Pelissier T. Edgeworth  If 7PM-7AM, please contact night-coverage [email or web address] 11/21/2022, 2:32 PM

## 2022-11-21 NOTE — Transfer of Care (Signed)
Immediate Anesthesia Transfer of Care Note  Patient: Leonard Cisneros  Procedure(s) Performed: ESOPHAGOGASTRODUODENOSCOPY (EGD) WITH PROPOFOL BIOPSY  Patient Location: PACU  Anesthesia Type:MAC  Level of Consciousness: drowsy and patient cooperative  Airway & Oxygen Therapy: Patient Spontanous Breathing and Patient connected to nasal cannula oxygen  Post-op Assessment: Report given to RN and Post -op Vital signs reviewed and stable  Post vital signs: Reviewed and stable, see PACU VS flowsheet  Last Vitals:  Vitals Value Taken Time  BP    Temp    Pulse    Resp    SpO2      Last Pain:  Vitals:   11/21/22 1026  TempSrc: Temporal  PainSc: 0-No pain         Complications: No notable events documented.

## 2022-11-21 NOTE — Progress Notes (Signed)
OT Cancellation Note  Patient Details Name: Leonard Cisneros MRN: [id number] DOB: July 23, 1941   Cancelled Treatment:    Reason Eval/Treat Not Completed: Patient at procedure or test/ unavailable  Renaye Rakers, OTD, OTR/L SecureChat Preferred Acute Rehab [phone] - Tiger 11/21/2022, 12:43 PM

## 2022-11-21 NOTE — Progress Notes (Signed)
Rounding Note    Patient Name: Leonard Cisneros Date of Encounter: 11/21/2022  [place] Cardiologist: Rozann Lesches, MD   Subjective   No bleeding.  Awaiting endoscopy.  GI notes reviewed.  Inpatient Medications    Scheduled Meds:  sodium chloride   Intravenous Once   furosemide  40 mg Intravenous BID   metoprolol succinate  25 mg Oral Daily   pantoprazole (PROTONIX) IV  40 mg Intravenous Q12H   Continuous Infusions:  sodium chloride 20 mL/hr at 11/21/22 0404   potassium chloride 10 mEq (11/21/22 0800)   PRN Meds: acetaminophen **OR** acetaminophen, levalbuterol, ondansetron **OR** ondansetron (ZOFRAN) IV   Vital Signs    Vitals:   11/20/22 1949 11/21/22 0023 11/21/22 0427 11/21/22 0751  BP: (!) 110/54 (!) 96/49 (!) 91/55 (!) 96/47  Pulse: 73 87    Resp: 18 18 18 18   Temp: 98 F (36.7 C) 98.5 F (36.9 C) 98.5 F (36.9 C) 97.9 F (36.6 C)  TempSrc: Oral Oral Oral Oral  SpO2:    96%  Weight:   93 kg   Height:        Intake/Output Summary (Last 24 hours) at 11/21/2022 0918 Last data filed at 11/20/2022 2300 Gross per 24 hour  Intake 360 ml  Output --  Net 360 ml      11/21/2022    4:27 AM 11/20/2022    4:22 AM 11/18/2022    6:40 PM  Last 3 Weights  Weight (lbs) 205 lb 1.6 oz 209 lb 12.8 oz 218 lb 14.4 oz  Weight (kg) 93.033 kg 95.165 kg 99.292 kg      Telemetry    Permanent atrial fibrillation, stable- Personally Reviewed  ECG    No new- Personally Reviewed  Physical Exam   GEN: No acute distress.   Neck: No JVD Cardiac: Irregularly irregular with 2/6 systolic murmur Respiratory: Clear to auscultation bilaterally. GI: Soft, nontender, non-distended  MS: No edema; No deformity. Neuro:  Nonfocal  Psych: Normal affect  No changes from prior exam Labs    High Sensitivity Troponin:   Recent Labs  Lab 11/19/22 0021 11/19/22 0213  TROPONINIHS 34* 36*     Chemistry Recent Labs  Lab 11/18/22 2113 11/19/22 0021  11/20/22 0031 11/21/22 0046  NA 132* 131* 134* 136  K 4.2 4.2 3.5 3.4*  CL 97* 99 98 101  CO2 26 26 29 29   GLUCOSE 97 88 95 93  BUN 15 13 12 13   CREATININE 0.78 0.77 0.92 0.86  CALCIUM 8.7* 8.5* 8.1* 8.2*  MG 2.3  --  1.9 1.8  PROT 5.7* 5.6*  --   --   ALBUMIN 3.0* 2.9* 2.9* 3.0*  AST 26 24  --   --   ALT 26 25  --   --   ALKPHOS 198* 217*  --   --   BILITOT 3.0* 2.7*  --   --   GFRNONAA >60 >60 >60 >60  ANIONGAP 9 6 7 6     Lipids No results for input(s): "CHOL", "TRIG", "HDL", "LABVLDL", "LDLCALC", "CHOLHDL" in the last 168 hours.  Hematology Recent Labs  Lab 11/19/22 0021 11/19/22 1045 11/20/22 0031 11/20/22 1029 11/20/22 2226 11/21/22 0046  WBC 7.3  --  6.9  --   --  6.0  RBC 1.96*  1.93*  --  2.21*  --   --  2.20*  HGB 7.1*   < > 8.0* 8.4* 8.3* 8.1*  HCT 22.8*   < > 24.9*  26.3* 25.3* 24.5*  MCV 116.3*  --  112.7*  --   --  111.4*  MCH 36.2*  --  36.2*  --   --  36.8*  MCHC 31.1  --  32.1  --   --  33.1  RDW 25.0*  --  26.4*  --   --  25.6*  PLT 171  --  156  --   --  157   < > = values in this interval not displayed.   Thyroid  Recent Labs  Lab 11/20/22 0031  TSH 3.009    BNP Recent Labs  Lab 11/18/22 2113 11/19/22 1045  BNP 779.5* 634.9*    DDimer No results for input(s): "DDIMER" in the last 168 hours.   Radiology    VAS [place] ABI WITH/WO TBI  Result Date: 11/20/2022  LOWER EXTREMITY DOPPLER STUDY Patient Name:  KARANDEEP HASKILL  Date of Exam:   11/20/2022 Medical Rec #: [id number]         Accession #:    [id number] Date of Birth: [birth year]        Patient Gender: M Patient Age:   61 years Exam Location:  [facility] Procedure:      VAS [place] ABI WITH/WO TBI Referring Phys: TAYE GONFA --------------------------------------------------------------------------------  High Risk Factors: Hypertension, hyperlipidemia, coronary artery disease.  Comparison Study: Prior study 04/13/2019 vessels non-compressible. Performing Technologist: McKayla Maag RVT, [place]   Examination Guidelines: A complete evaluation includes at minimum, Doppler waveform signals and systolic blood pressure reading at the level of bilateral brachial, anterior tibial, and posterior tibial arteries, when vessel segments are accessible. Bilateral testing is considered an integral part of a complete examination. Photoelectric Plethysmograph (PPG) waveforms and toe systolic pressure readings are included as required and additional duplex testing as needed. Limited examinations for reoccurring indications may be performed as noted.  ABI Findings: +---------+------------------+-----+----------+--------+ Right    Rt Pressure (mmHg)IndexWaveform  Comment  +---------+------------------+-----+----------+--------+ Brachial 108                    triphasic          +---------+------------------+-----+----------+--------+ PTA      255               2.36 monophasic         +---------+------------------+-----+----------+--------+ DP       255               2.36 monophasic         +---------+------------------+-----+----------+--------+ Great Toe40                0.37 Abnormal           +---------+------------------+-----+----------+--------+ +---------+------------------+-----+----------+--------------------------------+ Left     Lt Pressure (mmHg)IndexWaveform  Comment                          +---------+------------------+-----+----------+--------------------------------+ Brachial                        triphasic No pressure obtained due to IV                                             placement.                       +---------+------------------+-----+----------+--------------------------------+ PTA  255               2.36 monophasic                                 +---------+------------------+-----+----------+--------------------------------+ DP       255               2.36 monophasic                                  +---------+------------------+-----+----------+--------------------------------+ Great Toe39                0.36 Abnormal                                   +---------+------------------+-----+----------+--------------------------------+ +-------+----------------+-----------+------------+------------+ ABI/TBIToday's ABI     Today's TBIPrevious ABIPrevious TBI +-------+----------------+-----------+------------+------------+ Right  non-compressible0.37       1.77        0.51         +-------+----------------+-----------+------------+------------+ Left   non-compressible0.36       1.80        0.26         +-------+----------------+-----------+------------+------------+ Arterial wall calcification precludes accurate ankle pressures and ABIs. Right ABIs appear essentially unchanged. Left ABIs appear essentially unchanged.  Summary: Right: Resting right ankle-brachial index indicates noncompressible right lower extremity arteries. The right toe-brachial index is abnormal. Left: Resting left ankle-brachial index indicates noncompressible left lower extremity arteries. The left toe-brachial index is abnormal. *See table(s) above for measurements and observations.  Electronically signed by Deitra Mayo MD on 11/20/2022 at 4:40:14 PM.    Final    DG Chest 2 View  Result Date: 11/19/2022 CLINICAL DATA:  Congestive failure EXAM: CHEST - 2 VIEW COMPARISON:  09/25/2022 FINDINGS: Heart is again enlarged in size. Postsurgical changes are again seen. Pleural effusions are noted right considerably greater than left. Basilar atelectasis is noted right greater than left. Mild central vascular congestion is seen without significant edema. IMPRESSION: Mild vascular congestion without significant edema. Bilateral pleural effusions and associated atelectatic changes. Electronically Signed   By: Inez Catalina M.D.   On: 11/19/2022 11:34    Cardiac Studies   Cardiac Studies & Procedures   CARDIAC  CATHETERIZATION  CARDIAC CATHETERIZATION 09/27/2022  Narrative   Ost LAD to Prox LAD lesion is 100% stenosed.   Ramus lesion is 100% stenosed.   Prox RCA to Mid RCA lesion is 60% stenosed.   Dist RCA lesion is 90% stenosed.   Origin to Prox Graft lesion is 100% stenosed.   LIMA graft was visualized by angiography and is large.   RIMA graft was visualized by non-selective angiography and is normal in caliber.   The graft exhibits no disease.   The graft exhibits no disease.   LV end diastolic pressure is mildly elevated.   Hemodynamic findings consistent with mild pulmonary hypertension.   There is moderate aortic valve stenosis.  3 vessel occlusive CAD. The native LCx is widely patent LIMA to the LAD is patent RIMA to the PDA is patent SVG to the ramus intermediate is occluded. This appears to be chronic Mild to Moderate aortic stenosis. AV mean gradient 18.5 mm Hg. AVA 2.5 cm squared with index 1.15. Elevated LV filling pressures. LVEDP 20 mm Hg. Mean PCWP 24 mm Hg with large V  waves to 42 mm Hg Mild pulmonary HTN. PAP 56/17 with mean 31 mm Hg.  Plan: medical management. Continue diuresis. Would benefit greatly from correction of severe anemia. Will need to monitor Aortic stenosis serially going forward.  Findings Coronary Findings Diagnostic  Dominance: Right  Left Main Vessel was injected. Vessel is normal in caliber. Vessel is angiographically normal.  Left Anterior Descending Ost LAD to Prox LAD lesion is 100% stenosed.  Ramus Intermedius Ramus lesion is 100% stenosed.  Left Circumflex Vessel was injected. Vessel is normal in caliber. Vessel is angiographically normal.  Right Coronary Artery Prox RCA to Mid RCA lesion is 60% stenosed. The lesion is severely calcified. Dist RCA lesion is 90% stenosed.  LIMA LIMA Graft To Mid LAD LIMA graft was visualized by angiography and is large.  The graft exhibits no disease.  RIMA RIMA Graft To RPDA RIMA graft was  visualized by non-selective angiography and is normal in caliber.  The graft exhibits no disease.  Graft To Ramus Origin to Prox Graft lesion is 100% stenosed. The lesion was previously treated using a bare metal stent over 2 years ago.  Intervention  No interventions have been documented.   CARDIAC CATHETERIZATION  CARDIAC CATHETERIZATION 02/01/2016  Narrative  Ost LAD lesion, 100% stenosed.  Ost Ramus lesion, 100% stenosed.  Prox RCA lesion, 70% stenosed.  Mid RCA lesion, 50% stenosed.  RIMA .  LIMA .  Prox LAD to Mid LAD lesion, 75% stenosed.  SVG .  Origin lesion, 99% stenosed. Post intervention, there is a 0% residual stenosis.  The left ventricular systolic function is normal.  Low normal LV function with a subtle region of distal inferior hypocontractility.  Significant 2 vessel CAD with total occlusion of the LAD and ramus intermediate at its origin; normal left circumflex coronary artery; and RCA with 70% proximal stenosis with diffuse 50% mid stenoses with a " flush and fill" phenomena seen in the distal RCA due to competitive filling the the RIMA graft.  Patent LIMA graft supplying the mid LAD.  Patent RIMA graft supplying the distal RCA.  SVG supplying the ramus intermediate vessel with a 99% near ostial subtotal stenosis with thrombus.  Successful PCI to the SVG with ultimate insertion of a 2.512 mm NirXcell bare metal stent postdilated to 2.64 mm with a 99% stenosis being reduced to 0% resumption of brisk TIMI-3 flow.  RECOMMENDATION: The patient will be on triple therapy with aspirin/Plavix/Coumadin for a month.  Coumadin will be restarted tonight and after 1 month he will will be transitioned to dual therapy.  Findings Coronary Findings Diagnostic  Dominance: Right  Left Anterior Descending  Ramus Intermedius  Right Coronary Artery  RIMA RIMA Graft To RPAV RIMA  LIMA LIMA Graft To Dist LAD LIMA  Single Graft Graft To  Ramus SVG  Intervention  Origin lesion (Single Graft Graft To Ramus) PCI The pre-interventional distal flow is decreased (TIMI 2). Pre-stent angioplasty was performed. A bare metal stent was placed. The strut is apposed. Post-stent angioplasty was performed. The post-interventional distal flow is normal (TIMI 3). The intervention was successful. No complications occurred at this lesion. Supplies used: BALLN EUPHORA RX 2.0X10; BALLN EUPHORA RX 2.25X10; STENT NIRXCELL 2.5X12; BALLN [place] EMERGE MR 2.75X8 There is a 0% residual stenosis post intervention.   STRESS TESTS  NM MYOCAR MULTI W/SPECT W 11/12/2021  Narrative CLINICAL DATA:  Chest pain.  EXAM: MYOCARDIAL IMAGING WITH SPECT (REST AND PHARMACOLOGIC-STRESS)  GATED LEFT VENTRICULAR WALL MOTION STUDY  LEFT VENTRICULAR EJECTION  FRACTION  TECHNIQUE: Standard myocardial SPECT imaging was performed after resting intravenous injection of 10.4 mCi Tc-28m tetrofosmin. Subsequently, intravenous infusion of Lexiscan was performed under the supervision of the Cardiology staff. At peak effect of the drug, 31.9 mCi Tc-22m tetrofosmin was injected intravenously and standard myocardial SPECT imaging was performed. Quantitative gated imaging was also performed to evaluate left ventricular wall motion, and estimate left ventricular ejection fraction.  COMPARISON:  None.  FINDINGS: Perfusion: No decreased activity in the left ventricle on stress imaging to suggest reversible ischemia or infarction.  Wall Motion: Normal left ventricular wall motion. No left ventricular dilation.  Left Ventricular Ejection Fraction: 62 %  End diastolic volume [id number] ml  End systolic volume 53 ml  IMPRESSION: 1. No reversible ischemia or infarction.  2. Normal left ventricular wall motion.  3. Left ventricular ejection fraction 62%  4. Non invasive risk stratification*: Low  *2012 Appropriate Use Criteria for Coronary Revascularization Focused  Update: J Am Coll Cardiol. [id number]. http://content.airportbarriers.com.aspx?articleid=1201161   Electronically Signed By: Abigail Miyamoto M.D. On: 11/12/2021 14:59   ECHOCARDIOGRAM  ECHOCARDIOGRAM COMPLETE 09/25/2022  Narrative ECHOCARDIOGRAM REPORT    Patient Name:   SEITH SHINDER Date of Exam: 09/25/2022 Medical Rec #:  [id number]        Height:       71.0 in Accession #:    [id number]       Weight:       223.1 lb Date of Birth:  07/06/1941       BSA:          2.209 m Patient Age:    82 years         BP:           111/54 mmHg Patient Gender: M                HR:           74 bpm. Exam Location:  [facility]  Procedure: 2D Echo, Cardiac Doppler and Color Doppler  Indications:    CHF  History:        Patient has prior history of Echocardiogram examinations, most recent 11/12/2021. CHF and Cardiomyopathy, CAD, Prior CABG, PAD and Pulmonary HTN, Arrythmias:Atrial Fibrillation, Signs/Symptoms:Chest Pain; Risk Factors:Hypertension and Dyslipidemia. ETOH abuse.  Sonographer:    Wenda Low Referring Phys: [place]   1. Left ventricular ejection fraction, by estimation, is 55 to 60%. The left ventricle has normal function. The left ventricle has no regional wall motion abnormalities. There is moderate concentric left ventricular hypertrophy. Left ventricular diastolic parameters are indeterminate. 2. Right ventricular systolic function is normal. The right ventricular size is mildly enlarged. Tricuspid regurgitation signal is inadequate for assessing PA pressure. 3. Left atrial size was severely dilated. 4. Right atrial size was mildly dilated. 5. Left pleural effusion noted. 6. The mitral valve is degenerative. Mild mitral valve regurgitation. Severe mitral annular calcification. 7. The aortic valve is tricuspid. There is moderate calcification of the aortic valve. Aortic valve regurgitation is not visualized. Moderate to severe  aortic valve stenosis. Aortic valve mean gradient measures 24.0 mmHg. Dimentionless index 0.34. 8. Aortic dilatation noted. There is mild dilatation of the ascending aorta, measuring 39 mm. 9. The inferior vena cava is dilated in size with <50% respiratory variability, suggesting right atrial pressure of 15 mmHg.  Comparison(s): Prior images reviewed side by side. LVEF 55-60% range. Degenerative, calcific aortic stenosis of moderate to severe range with similar gradients as prior study.  FINDINGS Left Ventricle: Left ventricular ejection fraction, by estimation, is 55 to 60%. The left ventricle has normal function. The left ventricle has no regional wall motion abnormalities. The left ventricular internal cavity size was normal in size. There is moderate concentric left ventricular hypertrophy. Left ventricular diastolic function could not be evaluated due to atrial fibrillation. Left ventricular diastolic parameters are indeterminate.  Right Ventricle: The right ventricular size is mildly enlarged. No increase in right ventricular wall thickness. Right ventricular systolic function is normal. Tricuspid regurgitation signal is inadequate for assessing PA pressure.  Left Atrium: Left atrial size was severely dilated.  Right Atrium: Right atrial size was mildly dilated.  Pericardium: Left pleural effusion noted. There is no evidence of pericardial effusion.  Mitral Valve: The mitral valve is degenerative in appearance. Severe mitral annular calcification. Mild mitral valve regurgitation. MV peak gradient, 15.8 mmHg. The mean mitral valve gradient is 4.0 mmHg.  Tricuspid Valve: The tricuspid valve is grossly normal. Tricuspid valve regurgitation is mild.  Aortic Valve: The aortic valve is tricuspid. There is moderate calcification of the aortic valve. There is mild to moderate aortic valve annular calcification. Aortic valve regurgitation is not visualized. Moderate to severe aortic stenosis is  present. Aortic valve mean gradient measures 24.0 mmHg. Aortic valve peak gradient measures 42.1 mmHg. Aortic valve area, by VTI measures 1.06 cm.  Pulmonic Valve: The pulmonic valve was grossly normal. Pulmonic valve regurgitation is trivial.  Aorta: The aortic root is normal in size and structure and aortic dilatation noted. There is mild dilatation of the ascending aorta, measuring 39 mm.  Venous: The inferior vena cava is dilated in size with less than 50% respiratory variability, suggesting right atrial pressure of 15 mmHg.  IAS/Shunts: No atrial level shunt detected by color flow Doppler.  Additional Comments: There is pleural effusion in the left lateral region.   LEFT VENTRICLE PLAX 2D LVIDd:         5.50 cm   Diastology LVIDs:         3.90 cm   LV e' lateral:   12.40 cm/s LV PW:         1.30 cm   LV E/e' lateral: 14.1 LV IVS:        1.40 cm LVOT diam:     2.00 cm LV SV:         79 LV SV Index:   36 LVOT Area:     3.14 cm   RIGHT VENTRICLE RV Basal diam:  4.35 cm RV Mid diam:    3.60 cm RV S prime:     11.70 cm/s TAPSE (M-mode): 2.2 cm  LEFT ATRIUM              Index        RIGHT ATRIUM           Index LA diam:        5.50 cm  2.49 cm/m   RA Area:     26.20 cm LA Vol (A2C):   154.0 ml 69.72 ml/m  RA Volume:   85.70 ml  38.80 ml/m LA Vol (A4C):   188.0 ml 85.11 ml/m LA Biplane Vol: 176.0 ml 79.68 ml/m AORTIC VALVE                     PULMONIC VALVE AV Area (Vmax):    1.13 cm      PV Vmax:       1.22 m/s AV Area (Vmean):   1.05  cm      PV Peak grad:  6.0 mmHg AV Area (VTI):     1.06 cm AV Vmax:           324.50 cm/s AV Vmean:          222.500 cm/s AV VTI:            0.751 m AV Peak Grad:      42.1 mmHg AV Mean Grad:      24.0 mmHg LVOT Vmax:         116.33 cm/s LVOT Vmean:        74.533 cm/s LVOT VTI:          0.253 m LVOT/AV VTI ratio: 0.34  AORTA Ao Root diam: 3.60 cm Ao Asc diam:  3.90 cm  MITRAL VALVE MV Area (PHT): 4.83 cm     SHUNTS MV  Area VTI:   2.08 cm     Systemic VTI:  0.25 m MV Peak grad:  15.8 mmHg    Systemic Diam: 2.00 cm MV Mean grad:  4.0 mmHg MV Vmax:       1.99 m/s MV Vmean:      82.4 cm/s MV Decel Time: 157 msec MV E velocity: 175.00 cm/s  Rozann Lesches MD Electronically signed by Rozann Lesches MD Signature Date/Time: 09/25/2022/12:35:26 PM    Final              Patient Profile     82 y.o. 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 Cyndia Skeeters   Assessment & Plan    Acute on chronic diastolic heart failure  - he denied worsening chronic SOB, leg edema today, this was reported by admission provider.  BNP 779 on admit - dry weight is around 210ibs based on records, was 223 on 11/15/21, weight now 205.  - on torsemide 80mg  daily at home, when questioned, he was not aware what this is, ? compliance  -Will stop IV lasix now. BP soft and weight down. Breathing improved.  - no significant iron deficiency, agree with GI workup rule out bleeding, hemoglobin 8. - GDMT: A outpatient on metoprolol XL 100mg  daily but we reduced to 25mg  due to BP, holding aldactone 12.5mg  daily given borderline low BP, may add farixga 10mg  daily before discharge   - Getting KCL.    Permanent A fib Hx of atrial flutter s/p remote ablation  - hold rate control agents metoprolol and diltiazem, resume when BP is stable  - agree holding Eliquis given + FOBT and anemia, pending EGD and GI clearance for anticoagulation.  Okay to proceed with EGD or colonoscopy. Heart rate is 90-112. Reasonable, on Toprol reduced dose 25mg  from 100 due to BP.    CAD with hx of CABG 2003  and BMS to SVG to ramus 2017 - no chest pain - Hs trop 34>36 - EKG non-specific  - last LHC  09/27/22 stable CAD, continue medical therapy  -no changes     Moderate Aortic Stenosis - Mild to Moderate aortic stenosis. AV mean gradient 18.5 mm Hg. AVA 2.5 cm squared with index 1.15. - Followed every 6 months with Dr Domenic Polite, clinically monitoring   Suspected GI bleed Anemia  ETOH use PAD -  per primary team    For questions or updates, please contact [place] Please consult [email or web address] for contact info under       Signed, Candee Furbish, MD  11/21/2022, 9:18 AM

## 2022-11-21 NOTE — Progress Notes (Signed)
IV to left AC removed area red and swollen reported to floor nurse

## 2022-11-21 NOTE — Interval H&P Note (Signed)
History and Physical Interval Note:  11/21/2022 10:49 AM  Leonard Cisneros  has presented today for surgery, with the diagnosis of melena, anemia, eliquis.  The various methods of treatment have been discussed with the patient and family. After consideration of risks, benefits and other options for treatment, the patient has consented to  Procedure(s): ESOPHAGOGASTRODUODENOSCOPY (EGD) WITH PROPOFOL (N/A) as a surgical intervention.  The patient's history has been reviewed, patient examined, no change in status, stable for surgery.  I have reviewed the patient's chart and labs.  Questions were answered to the patient's satisfaction.     Selden Noteboom

## 2022-11-21 NOTE — Care Management Important Message (Signed)
Important Message  Patient Details  Name: Leonard Cisneros MRN: [id number] Date of Birth: 08-Jan-1941   Medicare Important Message Given:  Yes     Shelda Altes 11/21/2022, 8:04 AM

## 2022-11-21 NOTE — Op Note (Signed)
[facility] Patient Name: Leonard Cisneros Procedure Date : 11/21/2022 MRN: [id number] Attending MD: Mauri Pole , MD, [id number] Date of Birth: 07-May-1941 CSN: [id number] Age: 82 Admit Type: Inpatient Procedure:                Upper GI endoscopy Indications:              Suspected upper gastrointestinal bleeding, Melena,                            Recent gastrointestinal bleeding Providers:                Mauri Pole, MD, Fanny Skates RN, RN,                            Frazier Richards, Technician, Tyrone Apple,                            Technician Referring MD:             Juanda Chance Medicines:                Monitored Anesthesia Care Complications:            No immediate complications. Estimated Blood Loss:     Estimated blood loss was minimal. Procedure:                Pre-Anesthesia Assessment:                           - Prior to the procedure, a History and Physical                            was performed, and patient medications and                            allergies were reviewed. The patient's tolerance of                            previous anesthesia was also reviewed. The risks                            and benefits of the procedure and the sedation                            options and risks were discussed with the patient.                            All questions were answered, and informed consent                            was obtained. Prior Anticoagulants: The patient                            last took Eliquis (apixaban) 3 days prior to the  procedure. ASA Grade Assessment: III - A patient                            with severe systemic disease. After reviewing the                            risks and benefits, the patient was deemed in                            satisfactory condition to undergo the procedure.                           After obtaining informed consent, the endoscope was                             passed under direct vision. Throughout the                            procedure, the patient's blood pressure, pulse, and                            oxygen saturations were monitored continuously. The                            [id number]) Olympus endoscope was introduced                            through the mouth, and advanced to the second part                            of duodenum. The upper GI endoscopy was                            accomplished without difficulty. The patient                            tolerated the procedure well. Scope In: Scope Out: Findings:      The Z-line was regular and was found 38 cm from the incisors.      No gross lesions were noted in the entire esophagus.      Patchy mild inflammation characterized by congestion (edema) and       erythema was found in the entire examined stomach. Biopsies were taken       with a cold forceps for Helicobacter pylori testing.      The cardia and gastric fundus were normal on retroflexion.      One non-bleeding cratered duodenal ulcer with a clean ulcer base       (Forrest Class III) was found in the first portion of the duodenum. The       lesion was 10 mm in largest dimension.      A single 12 mm mucosal nodule was found in the second portion of the       duodenum. The nodule was Paris classification Is (protruding, sessile).       Biopsies were taken with a cold forceps for histology.  Impression:               - Z-line regular, 38 cm from the incisors.                           - No gross lesions in the entire esophagus.                           - Gastritis. Biopsied.                           - Non-bleeding duodenal ulcer with a clean ulcer                            base (Forrest Class III).                           - Mucosal nodule found in the duodenum. Biopsied. Recommendation:           - Soft diet.                           - Continue present medications.                           -  Use Protonix (pantoprazole) 40 mg IV twice daily                            during hospitalization, transition to oral on                            discharge and continue for 3 months.                           - Resume Eliquis (apixaban) at prior dose in 3                            days. Refer to managing physician for further                            adjustment of therapy.                           - Avoid NSAID's                           - Follow up H.pylori and treat if positive Procedure Code(s):        --- Professional ---                           [phone], Esophagogastroduodenoscopy, flexible,                            transoral; with biopsy, single or multiple Diagnosis Code(s):        --- Professional ---  K29.70, Gastritis, unspecified, without bleeding                           K26.9, Duodenal ulcer, unspecified as acute or                            chronic, without hemorrhage or perforation                           K31.89, Other diseases of stomach and duodenum                           K92.1, Melena (includes Hematochezia)                           K92.2, Gastrointestinal hemorrhage, unspecified CPT copyright 2022 [employer]. All rights reserved. The codes documented in this report are preliminary and upon coder review may  be revised to meet current compliance requirements. Mauri Pole, MD 11/21/2022 12:37:46 PM This report has been signed electronically. Number of Addenda: 0

## 2022-11-21 NOTE — TOC Progression Note (Signed)
Transition of Care [facility]) - Progression Note    Patient Details  Name: Leonard Cisneros MRN: [id number] Date of Birth: 09/23/1940  Transition of Care [facility] CM/SW Contact  Zenon Mayo, RN Phone Number: 11/21/2022, 3:16 PM  Clinical Narrative:    NCM spoke with patient at the bedside, he has crutches in the home and all the DME he needs. NCM offered choice for HHPT, [place], he states to call his wife.  NCM spoke with wife, she states she does not have a preference, NCM made referral to [facility] with [place].  She is able to take referral , soc will begin 24 to 48 hrs post dc.  Wife will transport him home at dc.    Expected Discharge Plan: [place] Barriers to Discharge: Continued Medical Work up  Expected Discharge Plan and Services   Discharge Planning Services: CM Consult Post Acute Care Choice: [place] arrangements for the past 2 months: Single Family Home                 DME Arranged: N/A DME Agency: NA       HH Arranged: PT, OT, RN [place] Agency: [place] Date [place]: 11/21/22 Time [place]: [id number] Representative spoke with at [place]: [place] Determinants of Health ([place]) Interventions SDOH Screenings   Food Insecurity: No Food Insecurity (09/24/2022)  Housing: Low Risk  (09/28/2022)  Transportation Needs: No Transportation Needs (09/24/2022)  Utilities: Not At Risk (09/24/2022)  Alcohol Screen: Low Risk  (09/28/2022)  Depression (PHQ2-9): Low Risk  (10/05/2022)  Financial Resource Strain: Low Risk  (09/28/2022)  Social Connections: Unknown (04/21/2018)  Stress: Stress Concern Present (04/21/2018)  Tobacco Use: Medium Risk (11/21/2022)    Readmission Risk Interventions    09/25/2022   12:42 PM  Readmission Risk Prevention Plan  Transportation Screening Complete  PCP or Specialist Appt within 5-7 Days Not Complete  Home Care Screening Complete  Medication Review (RN CM) Complete

## 2022-11-22 ENCOUNTER — Encounter (HOSPITAL_COMMUNITY): Payer: Self-pay | Admitting: Gastroenterology

## 2022-11-22 ENCOUNTER — Other Ambulatory Visit (HOSPITAL_COMMUNITY): Payer: Self-pay

## 2022-11-22 DIAGNOSIS — R195 Other fecal abnormalities: Secondary | ICD-10-CM

## 2022-11-22 DIAGNOSIS — K269 Duodenal ulcer, unspecified as acute or chronic, without hemorrhage or perforation: Secondary | ICD-10-CM | POA: Diagnosis not present

## 2022-11-22 DIAGNOSIS — I10 Essential (primary) hypertension: Secondary | ICD-10-CM | POA: Diagnosis not present

## 2022-11-22 DIAGNOSIS — R7989 Other specified abnormal findings of blood chemistry: Secondary | ICD-10-CM | POA: Diagnosis not present

## 2022-11-22 DIAGNOSIS — R0602 Shortness of breath: Secondary | ICD-10-CM | POA: Diagnosis not present

## 2022-11-22 DIAGNOSIS — Z951 Presence of aortocoronary bypass graft: Secondary | ICD-10-CM | POA: Diagnosis not present

## 2022-11-22 DIAGNOSIS — D500 Iron deficiency anemia secondary to blood loss (chronic): Secondary | ICD-10-CM | POA: Diagnosis not present

## 2022-11-22 DIAGNOSIS — D539 Nutritional anemia, unspecified: Secondary | ICD-10-CM | POA: Diagnosis not present

## 2022-11-22 DIAGNOSIS — I5033 Acute on chronic diastolic (congestive) heart failure: Secondary | ICD-10-CM | POA: Diagnosis not present

## 2022-11-22 DIAGNOSIS — I739 Peripheral vascular disease, unspecified: Secondary | ICD-10-CM | POA: Diagnosis not present

## 2022-11-22 DIAGNOSIS — K2970 Gastritis, unspecified, without bleeding: Secondary | ICD-10-CM | POA: Diagnosis not present

## 2022-11-22 DIAGNOSIS — I4891 Unspecified atrial fibrillation: Secondary | ICD-10-CM | POA: Diagnosis not present

## 2022-11-22 DIAGNOSIS — D6869 Other thrombophilia: Secondary | ICD-10-CM | POA: Diagnosis not present

## 2022-11-22 LAB — CBC
HCT: 25.2 % — ABNORMAL LOW (ref 39.0–52.0)
Hemoglobin: 8.2 g/dL — ABNORMAL LOW (ref 13.0–17.0)
MCH: 36.4 pg — ABNORMAL HIGH (ref 26.0–34.0)
MCHC: 32.5 g/dL (ref 30.0–36.0)
MCV: 112 fL — ABNORMAL HIGH (ref 80.0–100.0)
Platelets: 144 10*3/uL — ABNORMAL LOW (ref 150–400)
RBC: 2.25 MIL/uL — ABNORMAL LOW (ref 4.22–5.81)
RDW: 25.2 % — ABNORMAL HIGH (ref 11.5–15.5)
WBC: 6.3 10*3/uL (ref 4.0–10.5)
nRBC: 0 % (ref 0.0–0.2)

## 2022-11-22 LAB — HEMOGLOBIN AND HEMATOCRIT, BLOOD
HCT: 27.6 % — ABNORMAL LOW (ref 39.0–52.0)
Hemoglobin: 8.7 g/dL — ABNORMAL LOW (ref 13.0–17.0)

## 2022-11-22 LAB — RENAL FUNCTION PANEL
Albumin: 3 g/dL — ABNORMAL LOW (ref 3.5–5.0)
Anion gap: 8 (ref 5–15)
BUN: 13 mg/dL (ref 8–23)
CO2: 26 mmol/L (ref 22–32)
Calcium: 8.3 mg/dL — ABNORMAL LOW (ref 8.9–10.3)
Chloride: 103 mmol/L (ref 98–111)
Creatinine, Ser: 0.79 mg/dL (ref 0.61–1.24)
GFR, Estimated: >60 mL/min (ref >60)
Glucose, Bld: 95 mg/dL (ref 70–99)
Phosphorus: 2.5 mg/dL (ref 2.5–4.6)
Potassium: 4 mmol/L (ref 3.5–5.1)
Sodium: 137 mmol/L (ref 135–145)

## 2022-11-22 LAB — SURGICAL PATHOLOGY

## 2022-11-22 LAB — MAGNESIUM: Magnesium: 2 mg/dL (ref 1.7–2.4)

## 2022-11-22 MED ORDER — APIXABAN 5 MG PO TABS: 5.0000 mg | ORAL_TABLET | Freq: Two times a day (BID) | ORAL | 11 refills | Status: DC

## 2022-11-22 MED ORDER — METOPROLOL SUCCINATE ER 25 MG PO TB24
25.0000 mg | ORAL_TABLET | Freq: Every day | ORAL | 1 refills | Status: DC
  Filled 2022-11-22: qty 90, 90d supply, fill #0

## 2022-11-22 MED ORDER — ATORVASTATIN CALCIUM 80 MG PO TABS
80.0000 mg | ORAL_TABLET | Freq: Every day | ORAL | Status: DC
  Administered 2022-11-22: 80 mg via ORAL
  Filled 2022-11-22: qty 1

## 2022-11-22 MED ORDER — PANTOPRAZOLE SODIUM 40 MG PO TBEC
40.0000 mg | DELAYED_RELEASE_TABLET | Freq: Two times a day (BID) | ORAL | 0 refills | Status: DC
  Filled 2022-11-22: qty 180, 90d supply, fill #0

## 2022-11-22 NOTE — Progress Notes (Signed)
Rounding Note    Patient Name: Leonard Cisneros Date of Encounter: 11/22/2022  [place] Cardiologist: Rozann Lesches, MD   Subjective   Stable feels better, no SOB.Post EGD 3/20 - no active bleeding. Ready to go.   Inpatient Medications    Scheduled Meds:  sodium chloride   Intravenous Once   atorvastatin  80 mg Oral Daily   metoprolol succinate  25 mg Oral Daily   pantoprazole (PROTONIX) IV  40 mg Intravenous Q12H   Continuous Infusions:   PRN Meds: acetaminophen **OR** acetaminophen, levalbuterol, ondansetron **OR** ondansetron (ZOFRAN) IV   Vital Signs    Vitals:   11/21/22 1943 11/22/22 0020 11/22/22 0436 11/22/22 0716  BP: 110/67 (!) 107/51 (!) 105/49 110/62  Pulse: 82 89    Resp: 20 20 20 20   Temp: 97.9 F (36.6 C) 98.2 F (36.8 C) 98.2 F (36.8 C) 97.9 F (36.6 C)  TempSrc: Oral Oral Oral Oral  SpO2:    97%  Weight:   95.6 kg   Height:        Intake/Output Summary (Last 24 hours) at 11/22/2022 1024 Last data filed at 11/22/2022 0810 Gross per 24 hour  Intake 680 ml  Output --  Net 680 ml      11/22/2022    4:36 AM 11/21/2022    4:27 AM 11/20/2022    4:22 AM  Last 3 Weights  Weight (lbs) 210 lb 12.8 oz 205 lb 1.6 oz 209 lb 12.8 oz  Weight (kg) 95.618 kg 93.033 kg 95.165 kg      Telemetry    Permanent atrial fibrillation, stable, HR <110 on avg- Personally Reviewed  ECG    No new- Personally Reviewed  Physical Exam   GEN: No acute distress.   Neck: No JVD Cardiac: Irregularly irregular with 2/6 systolic murmur Respiratory: Clear to auscultation bilaterally. GI: Soft, nontender, non-distended  MS: No edema; No deformity. Neuro:  Nonfocal  Psych: Normal affect  No change in exam Labs    High Sensitivity Troponin:   Recent Labs  Lab 11/19/22 0021 11/19/22 0213  TROPONINIHS 34* 36*     Chemistry Recent Labs  Lab 11/18/22 2113 11/19/22 0021 11/20/22 0031 11/21/22 0046 11/22/22 0035  NA 132* 131* 134* 136  137  K 4.2 4.2 3.5 3.4* 4.0  CL 97* 99 98 101 103  CO2 26 26 29 29 26   GLUCOSE 97 88 95 93 95  BUN 15 13 12 13 13   CREATININE 0.78 0.77 0.92 0.86 0.79  CALCIUM 8.7* 8.5* 8.1* 8.2* 8.3*  MG 2.3  --  1.9 1.8 2.0  PROT 5.7* 5.6*  --   --   --   ALBUMIN 3.0* 2.9* 2.9* 3.0* 3.0*  AST 26 24  --   --   --   ALT 26 25  --   --   --   ALKPHOS 198* 217*  --   --   --   BILITOT 3.0* 2.7*  --   --   --   GFRNONAA >60 >60 >60 >60 >60  ANIONGAP 9 6 7 6 8     Lipids No results for input(s): "CHOL", "TRIG", "HDL", "LABVLDL", "LDLCALC", "CHOLHDL" in the last 168 hours.  Hematology Recent Labs  Lab 11/20/22 0031 11/20/22 1029 11/21/22 0046 11/21/22 1327 11/21/22 2251 11/22/22 0035  WBC 6.9  --  6.0  --   --  6.3  RBC 2.21*  --  2.20*  --   --  2.25*  HGB 8.0*   < > 8.1* 8.8* 8.2* 8.2*  HCT 24.9*   < > 24.5* 28.5* 25.0* 25.2*  MCV 112.7*  --  111.4*  --   --  112.0*  MCH 36.2*  --  36.8*  --   --  36.4*  MCHC 32.1  --  33.1  --   --  32.5  RDW 26.4*  --  25.6*  --   --  25.2*  PLT 156  --  157  --   --  144*   < > = values in this interval not displayed.   Thyroid  Recent Labs  Lab 11/20/22 0031  TSH 3.009    BNP Recent Labs  Lab 11/18/22 2113 11/19/22 1045  BNP 779.5* 634.9*    DDimer No results for input(s): "DDIMER" in the last 168 hours.   Radiology    VAS [place] ABI WITH/WO TBI  Result Date: 11/20/2022  LOWER EXTREMITY DOPPLER STUDY Patient Name:  BRAILYNN LAMATTINA  Date of Exam:   11/20/2022 Medical Rec #: [id number]         Accession #:    [id number] Date of Birth: 1941/08/31        Patient Gender: M Patient Age:   62 years Exam Location:  [facility] Procedure:      VAS [place] ABI WITH/WO TBI Referring Phys: TAYE GONFA --------------------------------------------------------------------------------  High Risk Factors: Hypertension, hyperlipidemia, coronary artery disease.  Comparison Study: Prior study 04/13/2019 vessels non-compressible. Performing Technologist: McKayla  Maag RVT, VT  Examination Guidelines: A complete evaluation includes at minimum, Doppler waveform signals and systolic blood pressure reading at the level of bilateral brachial, anterior tibial, and posterior tibial arteries, when vessel segments are accessible. Bilateral testing is considered an integral part of a complete examination. Photoelectric Plethysmograph (PPG) waveforms and toe systolic pressure readings are included as required and additional duplex testing as needed. Limited examinations for reoccurring indications may be performed as noted.  ABI Findings: +---------+------------------+-----+----------+--------+ Right    Rt Pressure (mmHg)IndexWaveform  Comment  +---------+------------------+-----+----------+--------+ Brachial 108                    triphasic          +---------+------------------+-----+----------+--------+ PTA      255               2.36 monophasic         +---------+------------------+-----+----------+--------+ DP       255               2.36 monophasic         +---------+------------------+-----+----------+--------+ Great Toe40                0.37 Abnormal           +---------+------------------+-----+----------+--------+ +---------+------------------+-----+----------+--------------------------------+ Left     Lt Pressure (mmHg)IndexWaveform  Comment                          +---------+------------------+-----+----------+--------------------------------+ Brachial                        triphasic No pressure obtained due to IV                                             placement.                       +---------+------------------+-----+----------+--------------------------------+  PTA      255               2.36 monophasic                                 +---------+------------------+-----+----------+--------------------------------+ DP       255               2.36 monophasic                                  +---------+------------------+-----+----------+--------------------------------+ Great Toe39                0.36 Abnormal                                   +---------+------------------+-----+----------+--------------------------------+ +-------+----------------+-----------+------------+------------+ ABI/TBIToday's ABI     Today's TBIPrevious ABIPrevious TBI +-------+----------------+-----------+------------+------------+ Right  non-compressible0.37       1.77        0.51         +-------+----------------+-----------+------------+------------+ Left   non-compressible0.36       1.80        0.26         +-------+----------------+-----------+------------+------------+ Arterial wall calcification precludes accurate ankle pressures and ABIs. Right ABIs appear essentially unchanged. Left ABIs appear essentially unchanged.  Summary: Right: Resting right ankle-brachial index indicates noncompressible right lower extremity arteries. The right toe-brachial index is abnormal. Left: Resting left ankle-brachial index indicates noncompressible left lower extremity arteries. The left toe-brachial index is abnormal. *See table(s) above for measurements and observations.  Electronically signed by Deitra Mayo MD on 11/20/2022 at 4:40:14 PM.    Final     Cardiac Studies   Cardiac Studies & Procedures   CARDIAC CATHETERIZATION  CARDIAC CATHETERIZATION 09/27/2022  Narrative   Ost LAD to Prox LAD lesion is 100% stenosed.   Ramus lesion is 100% stenosed.   Prox RCA to Mid RCA lesion is 60% stenosed.   Dist RCA lesion is 90% stenosed.   Origin to Prox Graft lesion is 100% stenosed.   LIMA graft was visualized by angiography and is large.   RIMA graft was visualized by non-selective angiography and is normal in caliber.   The graft exhibits no disease.   The graft exhibits no disease.   LV end diastolic pressure is mildly elevated.   Hemodynamic findings consistent with mild pulmonary  hypertension.   There is moderate aortic valve stenosis.  3 vessel occlusive CAD. The native LCx is widely patent LIMA to the LAD is patent RIMA to the PDA is patent SVG to the ramus intermediate is occluded. This appears to be chronic Mild to Moderate aortic stenosis. AV mean gradient 18.5 mm Hg. AVA 2.5 cm squared with index 1.15. Elevated LV filling pressures. LVEDP 20 mm Hg. Mean PCWP 24 mm Hg with large V waves to 42 mm Hg Mild pulmonary HTN. PAP 56/17 with mean 31 mm Hg.  Plan: medical management. Continue diuresis. Would benefit greatly from correction of severe anemia. Will need to monitor Aortic stenosis serially going forward.  Findings Coronary Findings Diagnostic  Dominance: Right  Left Main Vessel was injected. Vessel is normal in caliber. Vessel is angiographically normal.  Left Anterior Descending Ost LAD to Prox LAD lesion is 100% stenosed.  Ramus Intermedius Ramus lesion is 100% stenosed.  Left Circumflex Vessel was injected. Vessel is normal in caliber. Vessel is angiographically normal.  Right Coronary Artery Prox RCA to Mid RCA lesion is 60% stenosed. The lesion is severely calcified. Dist RCA lesion is 90% stenosed.  LIMA LIMA Graft To Mid LAD LIMA graft was visualized by angiography and is large.  The graft exhibits no disease.  RIMA RIMA Graft To RPDA RIMA graft was visualized by non-selective angiography and is normal in caliber.  The graft exhibits no disease.  Graft To Ramus Origin to Prox Graft lesion is 100% stenosed. The lesion was previously treated using a bare metal stent over 2 years ago.  Intervention  No interventions have been documented.   CARDIAC CATHETERIZATION  CARDIAC CATHETERIZATION 02/01/2016  Narrative  Ost LAD lesion, 100% stenosed.  Ost Ramus lesion, 100% stenosed.  Prox RCA lesion, 70% stenosed.  Mid RCA lesion, 50% stenosed.  RIMA .  LIMA .  Prox LAD to Mid LAD lesion, 75% stenosed.  SVG .  Origin  lesion, 99% stenosed. Post intervention, there is a 0% residual stenosis.  The left ventricular systolic function is normal.  Low normal LV function with a subtle region of distal inferior hypocontractility.  Significant 2 vessel CAD with total occlusion of the LAD and ramus intermediate at its origin; normal left circumflex coronary artery; and RCA with 70% proximal stenosis with diffuse 50% mid stenoses with a " flush and fill" phenomena seen in the distal RCA due to competitive filling the the RIMA graft.  Patent LIMA graft supplying the mid LAD.  Patent RIMA graft supplying the distal RCA.  SVG supplying the ramus intermediate vessel with a 99% near ostial subtotal stenosis with thrombus.  Successful PCI to the SVG with ultimate insertion of a 2.512 mm NirXcell bare metal stent postdilated to 2.64 mm with a 99% stenosis being reduced to 0% resumption of brisk TIMI-3 flow.  RECOMMENDATION: The patient will be on triple therapy with aspirin/Plavix/Coumadin for a month.  Coumadin will be restarted tonight and after 1 month he will will be transitioned to dual therapy.  Findings Coronary Findings Diagnostic  Dominance: Right  Left Anterior Descending  Ramus Intermedius  Right Coronary Artery  RIMA RIMA Graft To RPAV RIMA  LIMA LIMA Graft To Dist LAD LIMA  Single Graft Graft To Ramus SVG  Intervention  Origin lesion (Single Graft Graft To Ramus) PCI The pre-interventional distal flow is decreased (TIMI 2). Pre-stent angioplasty was performed. A bare metal stent was placed. The strut is apposed. Post-stent angioplasty was performed. The post-interventional distal flow is normal (TIMI 3). The intervention was successful. No complications occurred at this lesion. Supplies used: BALLN EUPHORA RX 2.0X10; BALLN EUPHORA RX 2.25X10; STENT NIRXCELL 2.5X12; BALLN [place] EMERGE MR 2.75X8 There is a 0% residual stenosis post intervention.   STRESS TESTS  NM MYOCAR MULTI W/SPECT W  11/12/2021  Narrative CLINICAL DATA:  Chest pain.  EXAM: MYOCARDIAL IMAGING WITH SPECT (REST AND PHARMACOLOGIC-STRESS)  GATED LEFT VENTRICULAR WALL MOTION STUDY  LEFT VENTRICULAR EJECTION FRACTION  TECHNIQUE: Standard myocardial SPECT imaging was performed after resting intravenous injection of 10.4 mCi Tc-9m tetrofosmin. Subsequently, intravenous infusion of Lexiscan was performed under the supervision of the Cardiology staff. At peak effect of the drug, 31.9 mCi Tc-12m tetrofosmin was injected intravenously and standard myocardial SPECT imaging was performed. Quantitative gated imaging was also performed to evaluate left ventricular wall motion, and estimate left ventricular ejection fraction.  COMPARISON:  None.  FINDINGS: Perfusion: No decreased activity in the left  ventricle on stress imaging to suggest reversible ischemia or infarction.  Wall Motion: Normal left ventricular wall motion. No left ventricular dilation.  Left Ventricular Ejection Fraction: 62 %  End diastolic volume [id number] ml  End systolic volume 53 ml  IMPRESSION: 1. No reversible ischemia or infarction.  2. Normal left ventricular wall motion.  3. Left ventricular ejection fraction 62%  4. Non invasive risk stratification*: Low  *2012 Appropriate Use Criteria for Coronary Revascularization Focused Update: J Am Coll Cardiol. N6492421. http://content.airportbarriers.com.aspx?articleid=1201161   Electronically Signed By: Abigail Miyamoto M.D. On: 11/12/2021 14:59   ECHOCARDIOGRAM  ECHOCARDIOGRAM COMPLETE 09/25/2022  Narrative ECHOCARDIOGRAM REPORT    Patient Name:   BASSIL BHUIYAN Date of Exam: 09/25/2022 Medical Rec #:  [id number]        Height:       71.0 in Accession #:    [id number]       Weight:       223.1 lb Date of Birth:  [birth year]       BSA:          2.209 m Patient Age:    10 years         BP:           111/54 mmHg Patient Gender: M                HR:            74 bpm. Exam Location:  [facility]  Procedure: 2D Echo, Cardiac Doppler and Color Doppler  Indications:    CHF  History:        Patient has prior history of Echocardiogram examinations, most recent 11/12/2021. CHF and Cardiomyopathy, CAD, Prior CABG, PAD and Pulmonary HTN, Arrythmias:Atrial Fibrillation, Signs/Symptoms:Chest Pain; Risk Factors:Hypertension and Dyslipidemia. ETOH abuse.  Sonographer:    Wenda Low Referring Phys: [place]   1. Left ventricular ejection fraction, by estimation, is 55 to 60%. The left ventricle has normal function. The left ventricle has no regional wall motion abnormalities. There is moderate concentric left ventricular hypertrophy. Left ventricular diastolic parameters are indeterminate. 2. Right ventricular systolic function is normal. The right ventricular size is mildly enlarged. Tricuspid regurgitation signal is inadequate for assessing PA pressure. 3. Left atrial size was severely dilated. 4. Right atrial size was mildly dilated. 5. Left pleural effusion noted. 6. The mitral valve is degenerative. Mild mitral valve regurgitation. Severe mitral annular calcification. 7. The aortic valve is tricuspid. There is moderate calcification of the aortic valve. Aortic valve regurgitation is not visualized. Moderate to severe aortic valve stenosis. Aortic valve mean gradient measures 24.0 mmHg. Dimentionless index 0.34. 8. Aortic dilatation noted. There is mild dilatation of the ascending aorta, measuring 39 mm. 9. The inferior vena cava is dilated in size with <50% respiratory variability, suggesting right atrial pressure of 15 mmHg.  Comparison(s): Prior images reviewed side by side. LVEF 55-60% range. Degenerative, calcific aortic stenosis of moderate to severe range with similar gradients as prior study.  FINDINGS Left Ventricle: Left ventricular ejection fraction, by estimation, is 55 to 60%. The left ventricle has  normal function. The left ventricle has no regional wall motion abnormalities. The left ventricular internal cavity size was normal in size. There is moderate concentric left ventricular hypertrophy. Left ventricular diastolic function could not be evaluated due to atrial fibrillation. Left ventricular diastolic parameters are indeterminate.  Right Ventricle: The right ventricular size is mildly enlarged. No increase in right ventricular wall thickness. Right ventricular  systolic function is normal. Tricuspid regurgitation signal is inadequate for assessing PA pressure.  Left Atrium: Left atrial size was severely dilated.  Right Atrium: Right atrial size was mildly dilated.  Pericardium: Left pleural effusion noted. There is no evidence of pericardial effusion.  Mitral Valve: The mitral valve is degenerative in appearance. Severe mitral annular calcification. Mild mitral valve regurgitation. MV peak gradient, 15.8 mmHg. The mean mitral valve gradient is 4.0 mmHg.  Tricuspid Valve: The tricuspid valve is grossly normal. Tricuspid valve regurgitation is mild.  Aortic Valve: The aortic valve is tricuspid. There is moderate calcification of the aortic valve. There is mild to moderate aortic valve annular calcification. Aortic valve regurgitation is not visualized. Moderate to severe aortic stenosis is present. Aortic valve mean gradient measures 24.0 mmHg. Aortic valve peak gradient measures 42.1 mmHg. Aortic valve area, by VTI measures 1.06 cm.  Pulmonic Valve: The pulmonic valve was grossly normal. Pulmonic valve regurgitation is trivial.  Aorta: The aortic root is normal in size and structure and aortic dilatation noted. There is mild dilatation of the ascending aorta, measuring 39 mm.  Venous: The inferior vena cava is dilated in size with less than 50% respiratory variability, suggesting right atrial pressure of 15 mmHg.  IAS/Shunts: No atrial level shunt detected by color flow  Doppler.  Additional Comments: There is pleural effusion in the left lateral region.   LEFT VENTRICLE PLAX 2D LVIDd:         5.50 cm   Diastology LVIDs:         3.90 cm   LV e' lateral:   12.40 cm/s LV PW:         1.30 cm   LV E/e' lateral: 14.1 LV IVS:        1.40 cm LVOT diam:     2.00 cm LV SV:         79 LV SV Index:   36 LVOT Area:     3.14 cm   RIGHT VENTRICLE RV Basal diam:  4.35 cm RV Mid diam:    3.60 cm RV S prime:     11.70 cm/s TAPSE (M-mode): 2.2 cm  LEFT ATRIUM              Index        RIGHT ATRIUM           Index LA diam:        5.50 cm  2.49 cm/m   RA Area:     26.20 cm LA Vol (A2C):   154.0 ml 69.72 ml/m  RA Volume:   85.70 ml  38.80 ml/m LA Vol (A4C):   188.0 ml 85.11 ml/m LA Biplane Vol: 176.0 ml 79.68 ml/m AORTIC VALVE                     PULMONIC VALVE AV Area (Vmax):    1.13 cm      PV Vmax:       1.22 m/s AV Area (Vmean):   1.05 cm      PV Peak grad:  6.0 mmHg AV Area (VTI):     1.06 cm AV Vmax:           324.50 cm/s AV Vmean:          222.500 cm/s AV VTI:            0.751 m AV Peak Grad:      42.1 mmHg AV Mean Grad:      24.0  mmHg LVOT Vmax:         116.33 cm/s LVOT Vmean:        74.533 cm/s LVOT VTI:          0.253 m LVOT/AV VTI ratio: 0.34  AORTA Ao Root diam: 3.60 cm Ao Asc diam:  3.90 cm  MITRAL VALVE MV Area (PHT): 4.83 cm     SHUNTS MV Area VTI:   2.08 cm     Systemic VTI:  0.25 m MV Peak grad:  15.8 mmHg    Systemic Diam: 2.00 cm MV Mean grad:  4.0 mmHg MV Vmax:       1.99 m/s MV Vmean:      82.4 cm/s MV Decel Time: 157 msec MV E velocity: 175.00 cm/s  Rozann Lesches MD Electronically signed by Rozann Lesches MD Signature Date/Time: 09/25/2022/12:35:26 PM    Final              Patient Profile     82 y.o. 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 Cyndia Skeeters   Assessment & Plan    Acute on chronic diastolic heart failure  - he denied worsening chronic SOB, leg edema today, this was reported by admission provider.  BNP 779 on admit - dry weight is around 210ibs based on records, was 223 on 11/15/21, weight now 205.  - on torsemide 80mg  daily at home, when questioned, he was not aware what this is, ? compliance  -Stopped IV lasix yesterday 3/20. BP soft and weight down. Breathing improved.  - no significant iron deficiency, agree with GI workup rule out bleeding, hemoglobin 8. - GDMT: A outpatient on metoprolol XL 100mg  daily but we reduced to 25mg  due to BP, holding aldactone 12.5mg  daily given borderline low BP, may add farixga 10mg  daily before discharge  - OK to restart home torsemide 80mg  QD    Permanent A fib Hx of atrial flutter s/p remote ablation  - hold rate control agents metoprolol and diltiazem, resume when BP is stable  - GI note reviewed - resume Eliquis in 2 days (3 days post procedure/EGD). Heart rate is 90-112. Reasonable, on Toprol reduced dose 25mg  from 100 due to BP.    CAD with hx of CABG 2003  and BMS to SVG to ramus 2017 - no chest pain - Hs trop 34>36 - EKG non-specific  - last LHC 09/27/22 stable CAD, continue medical therapy  -no changes     Moderate Aortic Stenosis - Mild to Moderate aortic stenosis. AV mean gradient 18.5 mm Hg. AVA 2.5 cm squared with index 1.15. - Followed every 6 months with Dr Domenic Polite, clinically monitoring   Suspected GI bleed - Duodenal ulcer, no hemorrhage.   Anemia  ETOH use PAD - per primary team   OK with DC home. Discussed with Dr. Cyndia Skeeters.  Restart torsemide 80 Wait on resuming Eliquis until 2 more days since ECG. Will get follow up.  For questions or updates, please contact [place] Please consult [email or web address] for contact info under        Signed, Candee Furbish, MD  11/22/2022, 10:24 AM

## 2022-11-22 NOTE — TOC Transition Note (Signed)
Transition of Care [facility]) - CM/SW Discharge Note   Patient Details  Name: Leonard Cisneros MRN: [id number] Date of Birth: 16-May-1941  [facility] CM/SW Contact:  Zenon Mayo, RN Phone Number: 11/22/2022, 10:54 AM   Clinical Narrative:    For dc today, wife will transport patient home. NCM notified Kelly with [facility].   Final next level of care: Home w Home Health Services Barriers to Discharge: Continued Medical Work up   Patient Goals and CMS Choice CMS Medicare.gov Compare Post Acute Care list provided to:: Patient Represenative (must comment) Choice offered to / list presented to : Spouse  Discharge Placement                         Discharge Plan and Services Additional resources added to the After Visit Summary for     Discharge Planning Services: CM Consult Post Acute Care Choice: Home Health          DME Arranged: N/A DME Agency: NA       HH Arranged: PT, OT HH Agency: [place] Date [place]: 11/21/22 Time [place]: [id number] Representative spoke with at [place]: [place] Determinants of Health ([place]) Interventions SDOH Screenings   Food Insecurity: No Food Insecurity (09/24/2022)  Housing: Low Risk  (09/28/2022)  Transportation Needs: No Transportation Needs (09/24/2022)  Utilities: Not At Risk (09/24/2022)  Alcohol Screen: Low Risk  (09/28/2022)  Depression (PHQ2-9): Low Risk  (10/05/2022)  Financial Resource Strain: Low Risk  (09/28/2022)  Social Connections: Unknown (04/21/2018)  Stress: Stress Concern Present (04/21/2018)  Tobacco Use: Medium Risk (11/21/2022)     Readmission Risk Interventions    09/25/2022   12:42 PM  Readmission Risk Prevention Plan  Transportation Screening Complete  PCP or Specialist Appt within 5-7 Days Not Complete  Home Care Screening Complete  Medication Review (RN CM) Complete

## 2022-11-22 NOTE — Anesthesia Postprocedure Evaluation (Signed)
Anesthesia Post Note  Patient: LITTLETON IBARA  Procedure(s) Performed: ESOPHAGOGASTRODUODENOSCOPY (EGD) WITH PROPOFOL BIOPSY     Patient location during evaluation: Endoscopy Anesthesia Type: MAC Level of consciousness: awake and alert Pain management: pain level controlled Vital Signs Assessment: post-procedure vital signs reviewed and stable Respiratory status: spontaneous breathing, nonlabored ventilation, respiratory function stable and patient connected to nasal cannula oxygen Cardiovascular status: stable and blood pressure returned to baseline Postop Assessment: no apparent nausea or vomiting Anesthetic complications: no  No notable events documented.  Last Vitals:  Vitals:   11/22/22 0436 11/22/22 0716  BP: (!) 105/49 110/62  Pulse:    Resp: 20 20  Temp: 36.8 C 36.6 C  SpO2:  97%    Last Pain:  Vitals:   11/22/22 0716  TempSrc: Oral  PainSc: 0-No pain                 Arlene Brickel

## 2022-11-22 NOTE — Discharge Summary (Signed)
Physician Discharge Summary  Leonard Cisneros [id number] DOB: 11/01/1940 DOA: 11/18/2022  PCP: Alvira Monday, FNP  Admit date: 11/18/2022 Discharge date: 11/22/2022 Admitted From: Home Disposition: Home Recommendations for Outpatient Follow-up:  Follow up with PCP as below. Cardiology to arrange outpatient follow-up  Assess fluid status, BP, CMP and CBC at follow-up Please follow up on the following pending results: Biopsies from EGD and H. pylori testing  Home Health: [facility] PT/OT/RN Equipment/Devices: Patient has appropriate DME's  Discharge Condition: Stable CODE STATUS: Full code  Follow-up Information     [facility] [place] Follow up.   Specialty: [place] Why: Agency will call you to set up apt times Contact information: [place] [address] [phone]         Alvira Monday, [employer]. Go in 1 week(s).   Specialty: Family Medicine Why: @1 :00pm Contact information: [address] [place] [address] [phone]                 Hospital course 82 year old M with PMH of CAD/CABG with recent LHC showing 3v-CAD, moderate aortic stenosis, ICM, diastolic CHF, lower back pain, A-flutter s/p ablation, permanent A-fib on Eliquis, PAD, anemia and HTN presented to [facility] with shortness of breath, weight gain, edema and melena, and transferred to [facility] for GI evaluation.  Hgb was 7.8 (down from 8.6).  He was started on IV Lasix for CHF exacerbation.    On arrival here, in mild RVR to 122.  91% on RA.  Hgb 7.5.  MSP 115.  INR 1.3.  CMP is without significant finding other than mild hyponatremia.  BNP 780.  TSH normal.  Troponin 36> 37.  EKG with rate controlled A-fib.  UA with pyuria and rare bacteria.  1 unit of blood ordered.  Started on PPI, Lasix and ceftriaxone.  Cardiology and GI consulted.   Anemia panel basically normal.  EGD with gastritis (biopsied), nonbleeding DU with clean ulcer base (Forrest class III) and mucosal  nodule in duodenum (biopsied).  GI recommended IV Protonix twice daily during hospitalization and discharged on p.o. twice daily for 3 months, holding Eliquis for 3 days and following H. pylori testing.   On the day of discharge, patient felt well.  H&H remained stable.  Cleared for discharge by cardiology.  Cardiology recommended decreasing Toprol to 25 mg daily given soft blood pressures and continuing home torsemide at 80 mg daily.  Cardiology to arrange outpatient follow-up.  Home health RN for CHF and medication compliance.  Patient is at risk for polypharmacy and does not clearly recall what he takes at home.  See individual problem list below for more.   Problems addressed during this hospitalization Principal Problem:   SOB (shortness of breath) Active Problems:   Hx of CABG   Acute on chronic diastolic congestive heart failure (HCC)   Essential hypertension   PAD (peripheral artery disease) (HCC)   Macrocytic anemia   Obesity (BMI 30-39.9)   Ischemic cardiomyopathy   Atrial fibrillation with RVR (HCC)   Hyponatremia   Acquired thrombophilia (HCC)   History of CAD (coronary artery disease)   Elevated troponin   Heme positive stool   Melena   Anemia due to chronic blood loss   Duodenal ulcer   Gastritis and gastroduodenitis              Time spent 40  Vital signs Vitals:   11/21/22 1943 11/22/22 0020 11/22/22 0436 11/22/22 0716  BP: 110/67 [name])  107/51 (!) 105/49 110/62  Pulse: 82 89    Temp: 97.9 F (36.6 C) 98.2 F (36.8 C) 98.2 F (36.8 C) 97.9 F (36.6 C)  Resp: 20 20 20 20   Height:      Weight:   95.6 kg   SpO2:    97%  TempSrc: Oral Oral Oral Oral  BMI (Calculated):   29.41      Discharge exam  GENERAL: No apparent distress.  Nontoxic. HEENT: MMM.  Vision and hearing grossly intact.  NECK: Supple.  No apparent JVD.  RESP:  No IWOB.  Fair aeration bilaterally. [name]:  RRR.  2/6 SEM over RUSB. ABD/GI/GU: BS+. Abd soft, NTND.  MSK/EXT:  Moves  extremities. No apparent deformity. No edema.  SKIN: BLE skin changes from chronic edema. NEURO: Awake and alert. Oriented appropriately.  No apparent focal neuro deficit. PSYCH: Calm. Normal affect.   Discharge Instructions Discharge Instructions     (HEART FAILURE PATIENTS) Call MD:  Anytime you have any of the following symptoms: 1) 3 pound weight gain in 24 hours or 5 pounds in 1 week 2) shortness of breath, with or without a dry hacking cough 3) swelling in the hands, feet or stomach 4) if you have to sleep on extra pillows at night in order to breathe.   Complete by: As directed    Call MD for:  difficulty breathing, headache or visual disturbances   Complete by: As directed    Call MD for:  extreme fatigue   Complete by: As directed    Call MD for:  persistant dizziness or light-headedness   Complete by: As directed    Diet - low sodium heart healthy   Complete by: As directed    Discharge instructions   Complete by: As directed    It has been a pleasure taking care of you!  You were hospitalized due to anemia from gastrointestinal bleeding and heart failure.  You have been treated endoscopically and with blood transfusion for anemia and gastrointestinal bleeding.  Gastroenterologist recommended holding your Eliquis until 11/25/2022.  Also recommended taking Protonix 40 mg twice a day.  We have made adjustment to your heart medication during this hospitalization.  Please review your new medication list and the directions on your medications before you take them.  Follow-up with your primary care doctor in 1 to 2 weeks or sooner if needed.  Follow-up with cardiologist and gastroenterologist per their recommendation. Avoid any over-the-counter pain medication other than plain Tylenol.  Continue watching your stool for signs of bleeding such as bright red blood or dark tarry stool  In addition to taking your medications as prescribed, we also recommend you avoid alcohol or  over-the-counter pain medication other than plain Tylenol, limit the amount of water/fluid you drink to less than 6 cups (1500 cc) a day,  limit your sodium (salt) intake to less than 2 g (2000 mg) a day and weigh yourself daily at the same time and keeping your weight log.     Take care,   Increase activity slowly   Complete by: As directed    No wound care   Complete by: As directed       Allergies as of 11/22/2022       Reactions   Augmentin [amoxicillin-pot Clavulanate] Nausea And Vomiting, Other (See Comments)   Has patient had a PCN reaction causing immediate rash, facial/tongue/throat swelling, SOB or lightheadedness with hypotension: Unknown Has patient had a PCN reaction causing severe rash involving mucus  membranes or skin necrosis: No Has patient had a PCN reaction that required hospitalization: No Has patient had a PCN reaction occurring within the last 10 years: No If all of the above answers are "NO", then may proceed with Cephalosporin use.        Medication List     STOP taking these medications    diltiazem 240 MG 24 hr capsule Commonly known as: Dilt-XR   spironolactone 25 MG tablet Commonly known as: ALDACTONE       TAKE these medications    apixaban 5 MG Tabs tablet Commonly known as: ELIQUIS Take 1 tablet (5 mg total) by mouth 2 (two) times daily. Start taking on: November 25, 2022 What changed: These instructions start on November 25, 2022. If you are unsure what to do until then, ask your doctor or other care provider.   atorvastatin 80 MG tablet Commonly known as: LIPITOR TAKE 1 TABLET BY MOUTH EVERY DAY   metoprolol succinate 25 MG 24 hr tablet Commonly known as: TOPROL-XL Take 1 tablet (25 mg total) by mouth daily. Start taking on: November 23, 2022 What changed:  medication strength See the new instructions.   nitroGLYCERIN 0.4 MG SL tablet Commonly known as: NITROSTAT Place 1 tablet (0.4 mg total) under the tongue every 5 (five) minutes x  3 doses as needed for chest pain (if no relief after 3rd dose, proceed to the ED for an evaluation or call 911).   pantoprazole 40 MG tablet Commonly known as: Protonix Take 1 tablet (40 mg total) by mouth 2 (two) times daily.   potassium chloride 10 MEQ tablet Commonly known as: KLOR-CON M Take 3 tablets (30 mEq total) by mouth daily.   torsemide 20 MG tablet Commonly known as: DEMADEX Take 80 mg (4 tablets) daily. If weight 211 lbs or greater, take extra 20 mg Lasix that day        Consultations: Cardiology Gastroenterology  Procedures/Studies: 3/20-EGD with gastritis (biopsied), nonbleeding DU with clean ulcer base (Forrest class III) and mucosal nodule in duodenum (biopsied).  GI recommended IV Protonix twice daily during hospitalization and discharged on p.o. twice daily for 3 months, holding Eliquis for 3 days and following H. pylori testing.    VAS [place] ABI WITH/WO TBI  Result Date: 11/20/2022  LOWER EXTREMITY DOPPLER STUDY Patient Name:  Leonard Cisneros  Date of Exam:   11/20/2022 Medical Rec #: [id number]         Accession #:    [id number] Date of Birth: Apr 09, 1941        Patient Gender: M Patient Age:   44 years Exam Location:  [facility] Procedure:      VAS [place] ABI WITH/WO TBI Referring Phys: Japneet Staggs --------------------------------------------------------------------------------  High Risk Factors: Hypertension, hyperlipidemia, coronary artery disease.  Comparison Study: Prior study 04/13/2019 vessels non-compressible. Performing Technologist: McKayla Maag RVT, [place]  Examination Guidelines: A complete evaluation includes at minimum, Doppler waveform signals and systolic blood pressure reading at the level of bilateral brachial, anterior tibial, and posterior tibial arteries, when vessel segments are accessible. Bilateral testing is considered an integral part of a complete examination. Photoelectric Plethysmograph (PPG) waveforms and toe systolic pressure readings are  included as required and additional duplex testing as needed. Limited examinations for reoccurring indications may be performed as noted.  ABI Findings: +---------+------------------+-----+----------+--------+ Right    Rt Pressure (mmHg)IndexWaveform  Comment  +---------+------------------+-----+----------+--------+ Brachial 108  triphasic          +---------+------------------+-----+----------+--------+ PTA      255               2.36 monophasic         +---------+------------------+-----+----------+--------+ DP       255               2.36 monophasic         +---------+------------------+-----+----------+--------+ Great Toe40                0.37 Abnormal           +---------+------------------+-----+----------+--------+ +---------+------------------+-----+----------+--------------------------------+ Left     Lt Pressure (mmHg)IndexWaveform  Comment                          +---------+------------------+-----+----------+--------------------------------+ Brachial                        triphasic No pressure obtained due to IV                                             placement.                       +---------+------------------+-----+----------+--------------------------------+ PTA      255               2.36 monophasic                                 +---------+------------------+-----+----------+--------------------------------+ DP       255               2.36 monophasic                                 +---------+------------------+-----+----------+--------------------------------+ Great Toe39                0.36 Abnormal                                   +---------+------------------+-----+----------+--------------------------------+ +-------+----------------+-----------+------------+------------+ ABI/TBIToday's ABI     Today's TBIPrevious ABIPrevious TBI +-------+----------------+-----------+------------+------------+  Right  non-compressible0.37       1.77        0.51         +-------+----------------+-----------+------------+------------+ Left   non-compressible0.36       1.80        0.26         +-------+----------------+-----------+------------+------------+ Arterial wall calcification precludes accurate ankle pressures and ABIs. Right ABIs appear essentially unchanged. Left ABIs appear essentially unchanged.  Summary: Right: Resting right ankle-brachial index indicates noncompressible right lower extremity arteries. The right toe-brachial index is abnormal. Left: Resting left ankle-brachial index indicates noncompressible left lower extremity arteries. The left toe-brachial index is abnormal. *See table(s) above for measurements and observations.  Electronically signed by Deitra Mayo MD on 11/20/2022 at 4:40:14 PM.    Final    DG Chest 2 View  Result Date: 11/19/2022 CLINICAL DATA:  Congestive failure EXAM: CHEST - 2 VIEW COMPARISON:  09/25/2022 FINDINGS: Heart is again enlarged in size. Postsurgical changes are again seen. Pleural effusions are noted right considerably greater than left.  Basilar atelectasis is noted right greater than left. Mild central vascular congestion is seen without significant edema. IMPRESSION: Mild vascular congestion without significant edema. Bilateral pleural effusions and associated atelectatic changes. Electronically Signed   By: Inez Catalina M.D.   On: 11/19/2022 11:34       The results of significant diagnostics from this hospitalization (including imaging, microbiology, ancillary and laboratory) are listed below for reference.     Microbiology: No results found for this or any previous visit (from the past 240 hour(s)).   Labs:  CBC: Recent Labs  Lab 11/18/22 2113 11/19/22 0021 11/19/22 1045 11/20/22 0031 11/20/22 1029 11/21/22 0046 11/21/22 1327 11/21/22 2251 11/22/22 0035 11/22/22 1024  WBC 7.5 7.3  --  6.9  --  6.0  --   --  6.3  --    NEUTROABS 5.5  --   --   --   --   --   --   --   --   --   HGB 7.5* 7.1*   < > 8.0*   < > 8.1* 8.8* 8.2* 8.2* 8.7*  HCT 23.2* 22.8*   < > 24.9*   < > 24.5* 28.5* 25.0* 25.2* 27.6*  MCV 114.9* 116.3*  --  112.7*  --  111.4*  --   --  112.0*  --   PLT 164 171  --  156  --  157  --   --  144*  --    < > = values in this interval not displayed.   BMP &GFR Recent Labs  Lab 11/18/22 2113 11/19/22 0021 11/20/22 0031 11/21/22 0046 11/22/22 0035  NA 132* 131* 134* 136 137  K 4.2 4.2 3.5 3.4* 4.0  CL 97* 99 98 101 103  CO2 26 26 29 29 26   GLUCOSE 97 88 95 93 95  BUN 15 13 12 13 13   CREATININE 0.78 0.77 0.92 0.86 0.79  CALCIUM 8.7* 8.5* 8.1* 8.2* 8.3*  MG 2.3  --  1.9 1.8 2.0  PHOS 3.6  --  3.2 2.7 2.5   Estimated Creatinine Clearance: 85.4 mL/min (by C-G formula based on SCr of 0.79 mg/dL). Liver & Pancreas: Recent Labs  Lab 11/18/22 2113 11/19/22 0021 11/20/22 0031 11/21/22 0046 11/22/22 0035  AST 26 24  --   --   --   ALT 26 25  --   --   --   ALKPHOS 198* 217*  --   --   --   BILITOT 3.0* 2.7*  --   --   --   PROT 5.7* 5.6*  --   --   --   ALBUMIN 3.0* 2.9* 2.9* 3.0* 3.0*   No results for input(s): "LIPASE", "AMYLASE" in the last 168 hours. No results for input(s): "AMMONIA" in the last 168 hours. Diabetic: No results for input(s): "HGBA1C" in the last 72 hours. Recent Labs  Lab 11/21/22 1552  GLUCAP 111*   Cardiac Enzymes: No results for input(s): "CKTOTAL", "CKMB", "CKMBINDEX", "TROPONINI" in the last 168 hours. No results for input(s): "PROBNP" in the last 8760 hours. Coagulation Profile: Recent Labs  Lab 11/18/22 2113  INR 1.3*   Thyroid Function Tests: Recent Labs    11/20/22 0031  TSH 3.009   Lipid Profile: No results for input(s): "CHOL", "HDL", "LDLCALC", "TRIG", "CHOLHDL", "LDLDIRECT" in the last 72 hours. Anemia Panel: No results for input(s): "VITAMINB12", "FOLATE", "FERRITIN", "TIBC", "IRON", "RETICCTPCT" in the last 72 hours. Urine  analysis:    Component Value Date/Time  COLORURINE YELLOW 11/19/2022 0115   APPEARANCEUR CLEAR 11/19/2022 0115   LABSPEC 1.006 11/19/2022 0115   PHURINE 6.0 11/19/2022 0115   GLUCOSEU NEGATIVE 11/19/2022 0115   HGBUR SMALL (A) 11/19/2022 0115   BILIRUBINUR NEGATIVE 11/19/2022 0115   KETONESUR NEGATIVE 11/19/2022 0115   PROTEINUR NEGATIVE 11/19/2022 0115   UROBILINOGEN 0.2 11/28/2010 0915   NITRITE NEGATIVE 11/19/2022 0115   LEUKOCYTESUR LARGE (A) 11/19/2022 0115   Sepsis Labs: Invalid input(s): "PROCALCITONIN", "LACTICIDVEN"   SIGNED:  Mercy Riding, MD  [facility] 11/22/2022, 5:37 PM

## 2022-11-22 NOTE — Progress Notes (Signed)
Assumed care at 0700, Aox4, tele monitored. All needs made known. Discharge paperwork and education complete. All questions answered. PIV and tele removed. Belongings gathered, awaiting transport.   

## 2022-11-22 NOTE — Progress Notes (Addendum)
Occupational Therapy Treatment Patient Details Name: Leonard Cisneros MRN: [id number] DOB: 07/05/1941 Today's Date: 11/22/2022   History of present illness 82 y.o. 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 Pt [facility] ER for several weeks of SOB and [facility] for further work-up.   OT comments  Pt continuing to progress towards patient focused goals. Pt now s/p EGD and functional level remains the same. Pt completing functional ambulation in the hall with SBA + RW but is able to walk to nurses station and back before fatiguing, at end of session HR was at 133 but returned to 110 following breathing techniques and seated rest in chair. Patient able to maintain brief periods of static balance Supervision no AD when completing sit>stand from chair. Pt reported he is unable to doff/don socks, demonstration the inability to reach socks or use figure four to perform lowerbody dressing. Pt reported that his wife puts on his socks and shoes, discussed AE to aide in independent dressing but patient dismissed the need. Patient will continue to receive skilled acute OT services to address above deficits and help transition to next level of care. Pt would benefit from home OT services to maximize functional independence.   Recommendations for follow up therapy are one component of a multi-disciplinary discharge planning process, led by the attending physician.  Recommendations may be updated based on patient status, additional functional criteria and insurance authorization.    Follow Up Recommendations  Home health OT     Assistance Recommended at Discharge Intermittent Supervision/Assistance  Patient can return home with  the following  A little help with walking and/or transfers;A lot of help with bathing/dressing/bathroom;Assistance with cooking/housework;Assist for transportation;Help with stairs or ramp for entrance   Equipment Recommendations  Tub/shower seat    Recommendations for Other Services      Precautions / Restrictions Precautions Precautions: Fall Restrictions Weight Bearing Restrictions: No       Mobility Bed Mobility               General bed mobility comments: Pt rec'd in chair    Transfers Overall transfer level: Needs assistance Equipment used: Crutches Transfers: Sit to/from Stand Sit to Stand: Supervision           General transfer comment: Pt completed sit>stand from chair no AD, able to maintain balance briefly without crutches.     Balance Overall balance assessment: Needs assistance Sitting-balance support: Feet supported, No upper extremity supported Sitting balance-Leahy Scale: Good     Standing balance support: Reliant on assistive device for balance, Bilateral upper extremity supported, During functional activity Standing balance-Leahy Scale: Fair Standing balance comment: Pt requires crutches to maintain dynamic balance, able to maintain brief static balance no AD Supervision                           ADL either performed or assessed with clinical judgement   ADL Overall ADL's : Needs assistance/impaired                       Lower Body Dressing Details (indicate cue type and reason): Pt demos inability to acheive fig four technique or reach BLEs  to doff/don socks             Functional mobility during ADLs:  (SBA + crutches) General ADL Comments: Pt ambulated hall just beyond the end of nurses station then back to room.    Extremity/Trunk Assessment Upper Extremity Assessment Upper Extremity Assessment: Overall WFL for tasks assessed   Lower Extremity Assessment Lower Extremity Assessment: Overall WFL for tasks  assessed RLE Deficits / Details: BLE edema LLE Deficits / Details: BLE edema   Cervical / Trunk Assessment Cervical / Trunk Assessment: Kyphotic    Vision   Vision Assessment?: No apparent visual deficits   Perception     Praxis      Cognition Arousal/Alertness: Awake/alert Behavior During Therapy: WFL for tasks assessed/performed Overall Cognitive Status: Difficult to assess                                 General Comments: Patient HOH but responds to prompts appropriately. Pt reported he dresses himself but when asked to don new socks he said that he could not reach his feet.        Exercises      Shoulder Instructions       General Comments At end of therapy session, HR montior displayed 133 after completing functional ambulation. HR returned to 110 following rest in chair and breathing techniques    Pertinent Vitals/ Pain       Pain Assessment Pain Assessment: No/denies pain  Home Living                                          Prior Functioning/Environment              Frequency  Min 2X/week        Progress Toward Goals  OT Goals(current goals can now be found in the care plan section)  Progress towards OT goals: Progressing toward goals  Acute Rehab OT Goals Patient Stated Goal: Get procedure done then go home OT Goal Formulation: With patient Time For Goal Achievement: 12/03/22 Potential to Achieve Goals: [place] Discharge plan remains appropriate;Frequency remains appropriate    Co-evaluation                 AM-PAC OT "6 Clicks" Daily Activity     Outcome Measure   Help from another person eating meals?: None Help from another person taking care of personal grooming?: None Help from another person toileting, which includes using toliet, bedpan, or urinal?: A Little Help from another person bathing (including washing, rinsing, drying)?: A Lot Help from another person to put on and taking off  regular upper body clothing?: None Help from another person to put on and taking off regular lower body clothing?: Total 6 Click Score: 18    End of Session Equipment Utilized During Treatment: Gait belt;Other (comment)  OT Visit Diagnosis: Unsteadiness on feet (R26.81);Muscle weakness (generalized) (M62.81)   Activity Tolerance Patient tolerated treatment well   Patient Left in chair;with call bell/phone within reach   Nurse Communication Mobility status        Time: [id number] OT Time Calculation (min): 12 min  Charges: OT General Charges $OT Visit: 1 Visit OT Treatments $Therapeutic Activity: 8-22 mins  11/22/2022  AB, OTR/L  Acute Rehabilitation Services  Office: [phone]   Cori Razor 11/22/2022, 8:57  AM

## 2022-11-23 ENCOUNTER — Encounter: Payer: Self-pay | Admitting: Cardiology

## 2022-11-23 ENCOUNTER — Other Ambulatory Visit: Payer: Self-pay | Admitting: *Deleted

## 2022-11-23 LAB — TYPE AND SCREEN
ABO/RH(D): O POS
Antibody Screen: POSITIVE
Unit division: 0
Unit division: 0
Unit division: 0

## 2022-11-23 LAB — BPAM RBC
Blood Product Expiration Date: 202404132359
Blood Product Expiration Date: 202404132359
Blood Product Expiration Date: 202404132359
ISSUE DATE / TIME: 202403180541
Unit Type and Rh: 5100
Unit Type and Rh: 5100
Unit Type and Rh: 5100

## 2022-11-23 MED ORDER — POTASSIUM CHLORIDE CRYS ER 10 MEQ PO TBCR: 30.0000 meq | EXTENDED_RELEASE_TABLET | Freq: Every day | ORAL | 1 refills | Status: DC

## 2022-11-25 DIAGNOSIS — K2990 Gastroduodenitis, unspecified, without bleeding: Secondary | ICD-10-CM | POA: Diagnosis not present

## 2022-11-25 DIAGNOSIS — E785 Hyperlipidemia, unspecified: Secondary | ICD-10-CM | POA: Diagnosis not present

## 2022-11-25 DIAGNOSIS — D500 Iron deficiency anemia secondary to blood loss (chronic): Secondary | ICD-10-CM | POA: Diagnosis not present

## 2022-11-25 DIAGNOSIS — G629 Polyneuropathy, unspecified: Secondary | ICD-10-CM | POA: Diagnosis not present

## 2022-11-25 DIAGNOSIS — D539 Nutritional anemia, unspecified: Secondary | ICD-10-CM | POA: Diagnosis not present

## 2022-11-25 DIAGNOSIS — K2970 Gastritis, unspecified, without bleeding: Secondary | ICD-10-CM | POA: Diagnosis not present

## 2022-11-25 DIAGNOSIS — I4821 Permanent atrial fibrillation: Secondary | ICD-10-CM | POA: Diagnosis not present

## 2022-11-25 DIAGNOSIS — G894 Chronic pain syndrome: Secondary | ICD-10-CM | POA: Diagnosis not present

## 2022-11-25 DIAGNOSIS — F1010 Alcohol abuse, uncomplicated: Secondary | ICD-10-CM | POA: Diagnosis not present

## 2022-11-25 DIAGNOSIS — K269 Duodenal ulcer, unspecified as acute or chronic, without hemorrhage or perforation: Secondary | ICD-10-CM | POA: Diagnosis not present

## 2022-11-25 DIAGNOSIS — I4892 Unspecified atrial flutter: Secondary | ICD-10-CM | POA: Diagnosis not present

## 2022-11-25 DIAGNOSIS — H9193 Unspecified hearing loss, bilateral: Secondary | ICD-10-CM | POA: Diagnosis not present

## 2022-11-25 DIAGNOSIS — I739 Peripheral vascular disease, unspecified: Secondary | ICD-10-CM | POA: Diagnosis not present

## 2022-11-25 DIAGNOSIS — I2510 Atherosclerotic heart disease of native coronary artery without angina pectoris: Secondary | ICD-10-CM | POA: Diagnosis not present

## 2022-11-25 DIAGNOSIS — K922 Gastrointestinal hemorrhage, unspecified: Secondary | ICD-10-CM | POA: Diagnosis not present

## 2022-11-25 DIAGNOSIS — I110 Hypertensive heart disease with heart failure: Secondary | ICD-10-CM | POA: Diagnosis not present

## 2022-11-25 DIAGNOSIS — I272 Pulmonary hypertension, unspecified: Secondary | ICD-10-CM | POA: Diagnosis not present

## 2022-11-25 DIAGNOSIS — I25810 Atherosclerosis of coronary artery bypass graft(s) without angina pectoris: Secondary | ICD-10-CM | POA: Diagnosis not present

## 2022-11-25 DIAGNOSIS — I350 Nonrheumatic aortic (valve) stenosis: Secondary | ICD-10-CM | POA: Diagnosis not present

## 2022-11-25 DIAGNOSIS — E876 Hypokalemia: Secondary | ICD-10-CM | POA: Diagnosis not present

## 2022-11-25 DIAGNOSIS — I5033 Acute on chronic diastolic (congestive) heart failure: Secondary | ICD-10-CM | POA: Diagnosis not present

## 2022-11-25 DIAGNOSIS — I5020 Unspecified systolic (congestive) heart failure: Secondary | ICD-10-CM | POA: Diagnosis not present

## 2022-11-25 DIAGNOSIS — E8770 Fluid overload, unspecified: Secondary | ICD-10-CM | POA: Diagnosis not present

## 2022-11-25 DIAGNOSIS — K921 Melena: Secondary | ICD-10-CM | POA: Diagnosis not present

## 2022-11-26 ENCOUNTER — Other Ambulatory Visit (HOSPITAL_COMMUNITY): Payer: Self-pay | Admitting: Adult Health

## 2022-11-26 ENCOUNTER — Telehealth: Payer: Self-pay | Admitting: Family Medicine

## 2022-11-26 ENCOUNTER — Telehealth: Payer: Self-pay | Admitting: Cardiology

## 2022-11-26 DIAGNOSIS — I5033 Acute on chronic diastolic (congestive) heart failure: Secondary | ICD-10-CM

## 2022-11-26 NOTE — Telephone Encounter (Addendum)
Spoke with patient who reports no problems with fluid Weighs daily today 198 lbs, yesterday 203 lbs, Saturday 210 lbs Denies dizziness, chest pain or SOB Medications reviewed Patient says he has no problems and is unsure what RN Pam is talking about Advised that Pam RN with [facility] would be contacted for clarification

## 2022-11-26 NOTE — Telephone Encounter (Signed)
Spoke to [employer] to approve verbal order

## 2022-11-26 NOTE — Telephone Encounter (Signed)
[facility] called in on patient behalf for verbal orders. Skilled nursing 1 week 3 2 month 1  2 prn   Heart failure Med management   Call back info  [phone]

## 2022-11-26 NOTE — Telephone Encounter (Signed)
Spoke with Pam RN w/[facility] who reports making home health visit with patient on yesterday and she observed swelling in both legs. Diuretic and K+ supplement reviewed with Pam who confirms patient is currently taking Torsemide 80 mg daily with K+ 10 meq taking 30 meq daily. Advised Pam that patient denies problems with swelling, reported weight has trended down and says he felt fine.  Pam request home health orders with frequency. Advised to contact PCP with this request.  Verbalized understanding.

## 2022-11-26 NOTE — Telephone Encounter (Signed)
RN Pam wanted to leave a message about the patient. Pam stated that she saw the patient yesterday and the focus of care was heart failure and medication management. Pam stated that she reviewed the patients medication with the patient's wife. Pam continued to say that the patient is still having fluid. Please advise

## 2022-11-27 ENCOUNTER — Encounter: Payer: Self-pay | Admitting: Gastroenterology

## 2022-11-28 ENCOUNTER — Telehealth: Payer: Self-pay | Admitting: *Deleted

## 2022-11-28 NOTE — Transitions of Care (Post Inpatient/ED Visit) (Signed)
   11/28/2022  Name: Leonard Cisneros MRN: [id number] DOB: [birth year]  Today's TOC FU Call Status: Today's TOC FU Call Status:: Successful TOC FU Call Competed TOC FU Call Complete Date: 11/28/22  Transition Care Management Follow-up Telephone Call Date of Discharge: 11/22/22 Discharge Facility: [facility]) Type of Discharge: Inpatient Admission Primary Inpatient Discharge Diagnosis:: shortness of breath How have you been since you were released from the hospital?: Better Any questions or concerns?: No  Items Reviewed: Did you receive and understand the discharge instructions provided?: Yes Medications obtained and verified?: Yes (Medications Reviewed) Any new allergies since your discharge?: No Do you have support at home?: Yes People in Home: spouse Name of Support/Comfort Primary Source: [place] and Equipment/Supplies: [place] Ordered?: Yes Name of [place]:: [place] set up a time to come to your home?: Yes [place] Visit Date: 11/26/22 Any new equipment or medical supplies ordered?: No  Functional Questionnaire: Do you need assistance with bathing/showering or dressing?: No Do you need assistance with meal preparation?: Yes Do you need assistance with eating?: No Do you have difficulty maintaining continence: No Do you need assistance with getting out of bed/getting out of a chair/moving?: No Do you have difficulty managing or taking your medications?: Yes  Follow up appointments reviewed: PCP Follow-up appointment confirmed?: Yes Date of PCP follow-up appointment?: 11/29/22 Follow-up Provider: Alvira Monday Specialist [facility] Follow-up appointment confirmed?: Yes Date of Specialist follow-up appointment?: 12/12/22 Follow-Up Specialty Provider:: [place] Do you need transportation to your follow-up appointment?: No Do you understand care options if your condition(s) worsen?:  Yes-patient verbalized understanding  SDOH Interventions Today    Flowsheet Row Most Recent Value  SDOH Interventions   Food Insecurity Interventions Intervention Not Indicated  Housing Interventions Intervention Not Indicated  Transportation Interventions Intervention Not Indicated       Interventions Today    Flowsheet Row Most Recent Value  Chronic Disease   Chronic disease during today's visit Congestive Heart Failure (CHF)  General Interventions   General Interventions Discussed/Reviewed General Interventions Discussed, General Interventions Reviewed, Doctor Visits, Referral to Nurse  [Referred to [facility] Coordinator for Plan of Care for Management of CHF]  Doctor Visits Discussed/Reviewed Doctor Visits Discussed, Doctor Visits Reviewed        [employer] [phone]

## 2022-11-29 ENCOUNTER — Ambulatory Visit (INDEPENDENT_AMBULATORY_CARE_PROVIDER_SITE_OTHER): Payer: Medicare HMO | Admitting: Family Medicine

## 2022-11-29 ENCOUNTER — Encounter: Payer: Self-pay | Admitting: Family Medicine

## 2022-11-29 VITALS — BP 90/64 | HR 85 | Ht 71.0 in | Wt 200.1 lb

## 2022-11-29 DIAGNOSIS — K269 Duodenal ulcer, unspecified as acute or chronic, without hemorrhage or perforation: Secondary | ICD-10-CM | POA: Diagnosis not present

## 2022-11-29 MED ORDER — SUCRALFATE 1 GM/10ML PO SUSP: 1.0000 g | Freq: Three times a day (TID) | ORAL | 2 refills | Status: DC

## 2022-11-29 NOTE — Progress Notes (Signed)
Established Patient Office Visit  Subjective:  Patient ID: Leonard Cisneros, male    DOB: 1941-04-25  Age: 82 y.o. MRN: [id number]  CC:  Chief Complaint  Patient presents with   Follow-up    Ed follow up d/c on 11/22/2022, pt reports feeling well.     HPI Leonard Cisneros is a 82 y.o. male with past medical history of CAD/CABG with recent LHC showing 3v-CAD, moderate aortic stenosis, ICM, diastolic CHF, lower back pain, A-flutter s/p ablation, permanent A-fib on Eliquis, PAD, anemia and HTN presents for ED follow-up.  Congestive heart failure/Anemia: The patient was admitted for complaints of shortness of breath, weight gain, edema, and melena and transferred to [facility] for GI evaluation.  He was started on IV Lasix for congestive heart failure exacerbation and received 1 units of blood for hemoglobin of 7.8.  EGD showed gastritis with nonbleeding duodenal ulcer with pending H. Pylori testing.The patient reports doing well since discharge from the hospital.  No complaints of shortness of breath, chest tightness, fatigue, dizziness, and lower extremity swelling.  He reports taking Protonix 40 mg twice daily.  He is following up with cardiology on 12/12/2022.  Past Medical History:  Diagnosis Date   Aortic stenosis    Atrial flutter ([place])    a. s/p remote ablation by Dr. [name].   CAD (coronary artery disease)    a. s/p CABG 2003. b. 01/2016: unstable angina - s/p BMS to SVG-ramus intermediate, patent LIMA-mLAD, patent RIMA-dRCA.    Chronic lower back pain    Essential hypertension    Hyperlipidemia    Ischemic cardiomyopathy    a. Cath 01/2016 -  EF 50-55% with mild distal inferior hypocontractility.   Peripheral neuropathy 04/04/2014   Permanent atrial fibrillation [facility])     Past Surgical History:  Procedure Laterality Date   BACK SURGERY     BIOPSY  11/21/2022   Procedure: BIOPSY;  Surgeon: Mauri Pole, MD;  Location: [facility];  Service: Gastroenterology;;    CARDIAC CATHETERIZATION  ~ 2000; 2003   CARDIAC CATHETERIZATION N/A 02/01/2016   Procedure: Left Heart Cath and Cors/Grafts Angiography;  Surgeon: [name], MD;  Location: [place] [facility];  Service: Cardiovascular;  Laterality: N/A;   CARDIAC CATHETERIZATION N/A 02/01/2016   Procedure: Coronary Stent Intervention;  Surgeon: [name], MD;  Location: [place] [facility];  Service: Cardiovascular;  Laterality: N/A;   CATARACT EXTRACTION W/PHACO Left 02/09/2019   Procedure: CATARACT EXTRACTION PHACO AND INTRAOCULAR LENS PLACEMENT (IOC);  Surgeon: [name], MD;  Location: [facility];  Service: Ophthalmology;  Laterality: Left;  CDE: 27.70   CATARACT EXTRACTION W/PHACO Right 02/23/2019   Procedure: CATARACT EXTRACTION PHACO AND INTRAOCULAR LENS PLACEMENT RIGHT EYE;  Surgeon: [name], MD;  Location: [facility];  Service: Ophthalmology;  Laterality: Right;  CDE: 10.15   COLONOSCOPY  06/04/2002   Normal colon and rectum   COLONOSCOPY  07/08/2012   Procedure: COLONOSCOPY;  Surgeon: [name], MD;  Location[facility];  Service: Endoscopy;  Laterality: N/A;  11:30   COLONOSCOPY N/A 10/02/2017   Procedure: COLONOSCOPY;  Surgeon: [name], MD;  [facility];  Service: Endoscopy;  Laterality: N/A;  10:30am   CORONARY ANGIOPLASTY WITH STENT PLACEMENT  02/01/2016   CORONARY ARTERY BYPASS GRAFT  2003   LIMA to LAD,LIMA to distal RCA,SVG to ramus intermediate vessel.   ESOPHAGOGASTRODUODENOSCOPY (EGD) WITH PROPOFOL N/A 11/21/2022   Procedure: ESOPHAGOGASTRODUODENOSCOPY (EGD) WITH PROPOFOL;  Surgeon:  Mauri Pole, MD;  Location: [facility];  Service: Gastroenterology;  Laterality: N/A;   FEMORAL REVISION Right 03/30/2019   Procedure: Femoral revision right total hip arthroplasty;  Surgeon: [name], MD;  Location: [facility];  Service: Orthopedics;  Laterality: Right;  158min   FOREIGN BODY REMOVAL Right 04/02/2014   Procedure: FOREIGN BODY REMOVAL RIGHT FOOT;   Surgeon: [name], MD;  Location: [facility];  Service: General;  Laterality: Right;   JOINT REPLACEMENT     JOINT REPLACEMENT     LUMBAR [place] SURGERY     REVISION TOTAL HIP ARTHROPLASTY Bilateral 2005-2012   right-left   RIGHT/LEFT HEART CATH AND CORONARY/GRAFT ANGIOGRAPHY N/A 09/27/2022   Procedure: RIGHT/LEFT HEART CATH AND CORONARY/GRAFT ANGIOGRAPHY;  Surgeon: Martinique, Peter M, MD;  Location: [place] [facility];  Service: Cardiovascular;  Laterality: N/A;   SHOULDER OPEN ROTATOR CUFF REPAIR Right    TOTAL HIP ARTHROPLASTY Right 1999   TOTAL HIP ARTHROPLASTY Left 2008   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    Family History  Problem Relation Age of Onset   Heart attack Brother    Colon cancer Neg Hx     Social History   Socioeconomic History   Marital status: Married    Spouse name: Jeani Hawking   Number of children: 2   Years of education: Not on file   Highest education level: Bachelor's degree (e.g., BA, AB, BS)  Occupational History   Occupation: PT Tourist information centre manager: RETIRED  Tobacco Use   Smoking status: Former    Packs/day: 0.50    Years: 2.00    Additional pack years: 0.00    Total pack years: 1.00    Types: Cigarettes    Quit date: 09/03/1966    Years since quitting: 56.2   Smokeless tobacco: Never  Vaping Use   Vaping Use: Never used  Substance and Sexual Activity   Alcohol use: Yes    Alcohol/week: 11.0 standard drinks of alcohol    Types: 7 Glasses of wine, 4 Cans of beer per week    Comment: a glass of wine with dinner/ beer on the weekend   Drug use: No   Sexual activity: Not on file  Other Topics Concern   Not on file  Social History Narrative   Lives with his wife.   Social Determinants of Health   Financial Resource Strain: Low Risk   (11/25/2022)   Overall Financial Resource Strain (CARDIA)    Difficulty of Paying Living Expenses: Not hard at all  Food Insecurity: No Food Insecurity (11/28/2022)   Hunger Vital Sign    Worried About Running Out of Food in the Last Year: Never true    Ran Out of Food in the Last Year: Never true  Transportation Needs: No Transportation Needs (11/28/2022)   PRAPARE - Hydrologist (Medical): No    Lack of Transportation (Non-Medical): No  Physical Activity: Unknown (11/25/2022)   Exercise Vital Sign    Days of Exercise per Week: 0 days    Minutes of Exercise per Session: Not on file  Stress: No Stress Concern Present (11/25/2022)   [place]    Feeling of Stress : Not at all  Social Connections: Moderately Integrated (11/25/2022)   Social  Connection and Isolation Panel [NHANES]    Frequency of Communication with Friends and Family: Once a week    Frequency of Social Gatherings with Friends and Family: Once a week    Attends Religious Services: More than 4 times per year    Active Member of [employer] or Organizations: Yes    Attends Music therapist: More than 4 times per year    Marital Status: Married  Human resources officer Violence: Not At Risk (09/24/2022)   Humiliation, Afraid, Rape, and Kick questionnaire    Fear of Current or Ex-Partner: No    Emotionally Abused: No    Physically Abused: No    Sexually Abused: No    Outpatient Medications Prior to Visit  Medication Sig Dispense Refill   apixaban (ELIQUIS) 5 MG TABS tablet Take 1 tablet (5 mg total) by mouth 2 (two) times daily. 60 tablet 11   metoprolol succinate (TOPROL-XL) 25 MG 24 hr tablet Take 1 tablet (25 mg total) by mouth daily. 90 tablet 1   nitroGLYCERIN (NITROSTAT) 0.4 MG SL tablet Place 1 tablet (0.4 mg total) under the tongue every 5 (five) minutes x 3 doses as needed for chest pain (if no relief after 3rd dose, proceed  to the ED for an evaluation or call 911). 25 tablet 3   pantoprazole (PROTONIX) 40 MG tablet Take 1 tablet (40 mg total) by mouth 2 (two) times daily. 180 tablet 0   potassium chloride (KLOR-CON M) 10 MEQ tablet Take 3 tablets (30 mEq total) by mouth daily. 90 tablet 1   torsemide (DEMADEX) 20 MG tablet TAKE 80 MG (4 TABLETS) DAILY. IF WEIGHT 211 LBS OR GREATER, TAKE EXTRA 20 MG LASIX THAT DAY 360 tablet 1   atorvastatin (LIPITOR) 80 MG tablet TAKE 1 TABLET BY MOUTH EVERY DAY (Patient taking differently: Take 80 mg by mouth daily.) 90 tablet 3   No facility-administered medications prior to visit.    Allergies  Allergen Reactions   Augmentin [Amoxicillin-Pot Clavulanate] Nausea And Vomiting and Other (See Comments)    Has patient had a PCN reaction causing immediate rash, facial/tongue/throat swelling, SOB or lightheadedness with hypotension: Unknown Has patient had a PCN reaction causing severe rash involving mucus membranes or skin necrosis: No Has patient had a PCN reaction that required hospitalization: No Has patient had a PCN reaction occurring within the last 10 years: No If all of the above answers are "NO", then may proceed with Cephalosporin use.     ROS Review of Systems  Constitutional:  Negative for chills, fatigue and fever.  Eyes:  Negative for visual disturbance.  Respiratory:  Negative for cough, chest tightness, shortness of breath and wheezing.   Cardiovascular:  Negative for chest pain and palpitations.  Gastrointestinal:  Negative for nausea and vomiting.  Neurological:  Negative for dizziness, weakness, light-headedness and headaches.  Psychiatric/Behavioral:  Negative for self-injury and suicidal ideas.       Objective:    Physical Exam HENT:     Head: Normocephalic.     Right Ear: External ear normal.     Left Ear: External ear normal.     Nose: No congestion or rhinorrhea.     Mouth/Throat:     Mouth: Mucous membranes are moist.  Cardiovascular:      Rate and Rhythm: Regular rhythm.     Heart sounds: No murmur heard. Pulmonary:     Effort: No respiratory distress.     Breath sounds: Normal breath sounds.  Neurological:  Mental Status: He is alert.     BP 90/64   Pulse 85   Ht 5\' 11"  (1.803 m)   Wt 200 lb 1.9 oz (90.8 kg)   SpO2 90%   BMI 27.91 kg/m  Wt Readings from Last 3 Encounters:  11/29/22 200 lb 1.9 oz (90.8 kg)  11/22/22 210 lb 12.8 oz (95.6 kg)  11/16/22 223 lb (101.2 kg)    Lab Results  Component Value Date   TSH 3.009 11/20/2022   Lab Results  Component Value Date   WBC 6.7 11/29/2022   HGB 8.9 (L) 11/29/2022   HCT 26.4 (L) 11/29/2022   MCV 104 (H) 11/29/2022   PLT 236 11/29/2022   Lab Results  Component Value Date   NA 141 11/29/2022   K 3.6 11/29/2022   CO2 26 11/29/2022   GLUCOSE 105 (H) 11/29/2022   BUN 15 11/29/2022   CREATININE 0.96 11/29/2022   BILITOT 2.2 (H) 11/29/2022   ALKPHOS 512 (H) 11/29/2022   AST 92 (H) 11/29/2022   ALT 73 (H) 11/29/2022   PROT 6.3 11/29/2022   ALBUMIN 3.7 11/29/2022   CALCIUM 8.8 11/29/2022   ANIONGAP 8 11/22/2022   EGFR 79 11/29/2022   Lab Results  Component Value Date   CHOL 110 11/12/2021   Lab Results  Component Value Date   HDL 74 11/12/2021   Lab Results  Component Value Date   LDLCALC 31 11/12/2021   Lab Results  Component Value Date   TRIG 23 11/12/2021   Lab Results  Component Value Date   CHOLHDL 1.5 11/12/2021   Lab Results  Component Value Date   HGBA1C 4.6 (L) 11/18/2022      Assessment & Plan:  Duodenal ulcer Assessment & Plan: Encouraged to continue taking Protonix 40 mg twice daily Will start  the patient on Carafate 1 g 3 times daily Referral placed to GI Pending CBC and CMP  Orders: -     Ambulatory referral to Gastroenterology -     CMP14+EGFR -     CBC with Differential/Platelet -     Sucralfate; Take 10 mLs (1 g total) by mouth in the morning, at noon, and at bedtime.  Dispense: 420 mL; Refill:  2    Follow-up: No follow-ups on file.   Alvira Monday, FNP

## 2022-11-29 NOTE — Patient Instructions (Addendum)
I appreciate the opportunity to provide care to you today!    Follow up: 01/11/2023  Labs: please stop by the lab today to get your blood drawn (CBC, CMP)  Gastritis and Duodenal ulcer -Avoid NSAIDs -Continue taking PPI (Protonix 40 mg twice daily) and Carafate 1g 3 times daily    Referral:  GI    Please continue to a heart-healthy diet and increase your physical activities. Try to exercise for 83mins at least five days a week.      It was a pleasure to see you and I look forward to continuing to work together on your health and well-being. Please do not hesitate to call the office if you need care or have questions about your care.   Have a wonderful day and week. With Gratitude, Alvira Monday MSN, FNP-BC

## 2022-11-30 ENCOUNTER — Other Ambulatory Visit: Payer: Self-pay | Admitting: Cardiology

## 2022-11-30 LAB — CBC WITH DIFFERENTIAL/PLATELET
Basophils Absolute: 0 10*3/uL (ref 0.0–0.2)
Basos: 1 %
EOS (ABSOLUTE): 0.3 10*3/uL (ref 0.0–0.4)
Eos: 4 %
Hematocrit: 26.4 % — ABNORMAL LOW (ref 37.5–51.0)
Hemoglobin: 8.9 g/dL — ABNORMAL LOW (ref 13.0–17.7)
Immature Grans (Abs): 0 10*3/uL (ref 0.0–0.1)
Immature Granulocytes: 1 %
Lymphocytes Absolute: 1.4 10*3/uL (ref 0.7–3.1)
Lymphs: 21 %
MCH: 35.2 pg — ABNORMAL HIGH (ref 26.6–33.0)
MCHC: 33.7 g/dL (ref 31.5–35.7)
MCV: 104 fL — ABNORMAL HIGH (ref 79–97)
Monocytes Absolute: 0.7 10*3/uL (ref 0.1–0.9)
Monocytes: 11 %
Neutrophils Absolute: 4.2 10*3/uL (ref 1.4–7.0)
Neutrophils: 62 %
Platelets: 236 10*3/uL (ref 150–450)
RBC: 2.53 x10E6/uL — CL (ref 4.14–5.80)
RDW: 19.9 % — ABNORMAL HIGH (ref 11.6–15.4)
WBC: 6.7 10*3/uL (ref 3.4–10.8)

## 2022-11-30 LAB — CMP14+EGFR
ALT: 73 IU/L — ABNORMAL HIGH (ref 0–44)
AST: 92 IU/L — ABNORMAL HIGH (ref 0–40)
Albumin/Globulin Ratio: 1.4 (ref 1.2–2.2)
Albumin: 3.7 g/dL (ref 3.7–4.7)
Alkaline Phosphatase: 512 IU/L — ABNORMAL HIGH (ref 44–121)
BUN/Creatinine Ratio: 16 (ref 10–24)
BUN: 15 mg/dL (ref 8–27)
Bilirubin Total: 2.2 mg/dL — ABNORMAL HIGH (ref 0.0–1.2)
CO2: 26 mmol/L (ref 20–29)
Calcium: 8.8 mg/dL (ref 8.6–10.2)
Chloride: 97 mmol/L (ref 96–106)
Creatinine, Ser: 0.96 mg/dL (ref 0.76–1.27)
Globulin, Total: 2.6 g/dL (ref 1.5–4.5)
Glucose: 105 mg/dL — ABNORMAL HIGH (ref 70–99)
Potassium: 3.6 mmol/L (ref 3.5–5.2)
Sodium: 141 mmol/L (ref 134–144)
Total Protein: 6.3 g/dL (ref 6.0–8.5)
eGFR: 79 mL/min/{1.73_m2} (ref >59)

## 2022-11-30 NOTE — Assessment & Plan Note (Addendum)
Encouraged to continue taking Protonix 40 mg twice daily Will start  the patient on Carafate 1 g 3 times daily Referral placed to GI Pending CBC and CMP

## 2022-12-02 ENCOUNTER — Other Ambulatory Visit: Payer: Self-pay | Admitting: Family Medicine

## 2022-12-02 DIAGNOSIS — D62 Acute posthemorrhagic anemia: Secondary | ICD-10-CM

## 2022-12-02 MED ORDER — IRON (FERROUS SULFATE) 325 (65 FE) MG PO TABS: 325.0000 mg | ORAL_TABLET | Freq: Every day | ORAL | 2 refills | Status: DC

## 2022-12-03 DIAGNOSIS — I110 Hypertensive heart disease with heart failure: Secondary | ICD-10-CM | POA: Diagnosis not present

## 2022-12-03 DIAGNOSIS — K921 Melena: Secondary | ICD-10-CM | POA: Diagnosis not present

## 2022-12-03 DIAGNOSIS — I350 Nonrheumatic aortic (valve) stenosis: Secondary | ICD-10-CM | POA: Diagnosis not present

## 2022-12-03 DIAGNOSIS — D500 Iron deficiency anemia secondary to blood loss (chronic): Secondary | ICD-10-CM | POA: Diagnosis not present

## 2022-12-03 DIAGNOSIS — I272 Pulmonary hypertension, unspecified: Secondary | ICD-10-CM | POA: Diagnosis not present

## 2022-12-03 DIAGNOSIS — I4892 Unspecified atrial flutter: Secondary | ICD-10-CM | POA: Diagnosis not present

## 2022-12-03 DIAGNOSIS — I5020 Unspecified systolic (congestive) heart failure: Secondary | ICD-10-CM | POA: Diagnosis not present

## 2022-12-03 DIAGNOSIS — K922 Gastrointestinal hemorrhage, unspecified: Secondary | ICD-10-CM | POA: Diagnosis not present

## 2022-12-03 DIAGNOSIS — K269 Duodenal ulcer, unspecified as acute or chronic, without hemorrhage or perforation: Secondary | ICD-10-CM | POA: Diagnosis not present

## 2022-12-03 DIAGNOSIS — I25810 Atherosclerosis of coronary artery bypass graft(s) without angina pectoris: Secondary | ICD-10-CM | POA: Diagnosis not present

## 2022-12-03 DIAGNOSIS — K2990 Gastroduodenitis, unspecified, without bleeding: Secondary | ICD-10-CM | POA: Diagnosis not present

## 2022-12-03 DIAGNOSIS — I739 Peripheral vascular disease, unspecified: Secondary | ICD-10-CM | POA: Diagnosis not present

## 2022-12-03 DIAGNOSIS — G629 Polyneuropathy, unspecified: Secondary | ICD-10-CM | POA: Diagnosis not present

## 2022-12-03 DIAGNOSIS — F1010 Alcohol abuse, uncomplicated: Secondary | ICD-10-CM | POA: Diagnosis not present

## 2022-12-03 DIAGNOSIS — H9193 Unspecified hearing loss, bilateral: Secondary | ICD-10-CM | POA: Diagnosis not present

## 2022-12-03 DIAGNOSIS — E785 Hyperlipidemia, unspecified: Secondary | ICD-10-CM | POA: Diagnosis not present

## 2022-12-03 DIAGNOSIS — E8770 Fluid overload, unspecified: Secondary | ICD-10-CM | POA: Diagnosis not present

## 2022-12-03 DIAGNOSIS — I2510 Atherosclerotic heart disease of native coronary artery without angina pectoris: Secondary | ICD-10-CM | POA: Diagnosis not present

## 2022-12-03 DIAGNOSIS — I5033 Acute on chronic diastolic (congestive) heart failure: Secondary | ICD-10-CM | POA: Diagnosis not present

## 2022-12-03 DIAGNOSIS — D539 Nutritional anemia, unspecified: Secondary | ICD-10-CM | POA: Diagnosis not present

## 2022-12-03 DIAGNOSIS — K2970 Gastritis, unspecified, without bleeding: Secondary | ICD-10-CM | POA: Diagnosis not present

## 2022-12-03 DIAGNOSIS — E876 Hypokalemia: Secondary | ICD-10-CM | POA: Diagnosis not present

## 2022-12-03 DIAGNOSIS — G894 Chronic pain syndrome: Secondary | ICD-10-CM | POA: Diagnosis not present

## 2022-12-03 DIAGNOSIS — I4821 Permanent atrial fibrillation: Secondary | ICD-10-CM | POA: Diagnosis not present

## 2022-12-04 ENCOUNTER — Ambulatory Visit: Payer: Self-pay | Admitting: *Deleted

## 2022-12-04 ENCOUNTER — Encounter: Payer: Self-pay | Admitting: *Deleted

## 2022-12-05 DIAGNOSIS — K2970 Gastritis, unspecified, without bleeding: Secondary | ICD-10-CM | POA: Diagnosis not present

## 2022-12-05 DIAGNOSIS — K921 Melena: Secondary | ICD-10-CM | POA: Diagnosis not present

## 2022-12-05 DIAGNOSIS — I110 Hypertensive heart disease with heart failure: Secondary | ICD-10-CM | POA: Diagnosis not present

## 2022-12-05 DIAGNOSIS — I2510 Atherosclerotic heart disease of native coronary artery without angina pectoris: Secondary | ICD-10-CM | POA: Diagnosis not present

## 2022-12-05 DIAGNOSIS — I5020 Unspecified systolic (congestive) heart failure: Secondary | ICD-10-CM | POA: Diagnosis not present

## 2022-12-05 DIAGNOSIS — I350 Nonrheumatic aortic (valve) stenosis: Secondary | ICD-10-CM | POA: Diagnosis not present

## 2022-12-05 DIAGNOSIS — G629 Polyneuropathy, unspecified: Secondary | ICD-10-CM | POA: Diagnosis not present

## 2022-12-05 DIAGNOSIS — K2990 Gastroduodenitis, unspecified, without bleeding: Secondary | ICD-10-CM | POA: Diagnosis not present

## 2022-12-05 DIAGNOSIS — H9193 Unspecified hearing loss, bilateral: Secondary | ICD-10-CM | POA: Diagnosis not present

## 2022-12-05 DIAGNOSIS — I4892 Unspecified atrial flutter: Secondary | ICD-10-CM | POA: Diagnosis not present

## 2022-12-05 DIAGNOSIS — E876 Hypokalemia: Secondary | ICD-10-CM | POA: Diagnosis not present

## 2022-12-05 DIAGNOSIS — I739 Peripheral vascular disease, unspecified: Secondary | ICD-10-CM | POA: Diagnosis not present

## 2022-12-05 DIAGNOSIS — I272 Pulmonary hypertension, unspecified: Secondary | ICD-10-CM | POA: Diagnosis not present

## 2022-12-05 DIAGNOSIS — I25810 Atherosclerosis of coronary artery bypass graft(s) without angina pectoris: Secondary | ICD-10-CM | POA: Diagnosis not present

## 2022-12-05 DIAGNOSIS — D539 Nutritional anemia, unspecified: Secondary | ICD-10-CM | POA: Diagnosis not present

## 2022-12-05 DIAGNOSIS — F1010 Alcohol abuse, uncomplicated: Secondary | ICD-10-CM | POA: Diagnosis not present

## 2022-12-05 DIAGNOSIS — I5033 Acute on chronic diastolic (congestive) heart failure: Secondary | ICD-10-CM | POA: Diagnosis not present

## 2022-12-05 DIAGNOSIS — G894 Chronic pain syndrome: Secondary | ICD-10-CM | POA: Diagnosis not present

## 2022-12-05 DIAGNOSIS — E785 Hyperlipidemia, unspecified: Secondary | ICD-10-CM | POA: Diagnosis not present

## 2022-12-05 DIAGNOSIS — I4821 Permanent atrial fibrillation: Secondary | ICD-10-CM | POA: Diagnosis not present

## 2022-12-05 DIAGNOSIS — D500 Iron deficiency anemia secondary to blood loss (chronic): Secondary | ICD-10-CM | POA: Diagnosis not present

## 2022-12-05 DIAGNOSIS — K269 Duodenal ulcer, unspecified as acute or chronic, without hemorrhage or perforation: Secondary | ICD-10-CM | POA: Diagnosis not present

## 2022-12-05 DIAGNOSIS — K922 Gastrointestinal hemorrhage, unspecified: Secondary | ICD-10-CM | POA: Diagnosis not present

## 2022-12-05 DIAGNOSIS — E8770 Fluid overload, unspecified: Secondary | ICD-10-CM | POA: Diagnosis not present

## 2022-12-06 ENCOUNTER — Encounter: Payer: Self-pay | Admitting: Family Medicine

## 2022-12-10 ENCOUNTER — Encounter: Payer: Self-pay | Admitting: Family Medicine

## 2022-12-12 ENCOUNTER — Encounter: Payer: Self-pay | Admitting: Student

## 2022-12-12 ENCOUNTER — Ambulatory Visit: Payer: Medicare HMO | Attending: Student | Admitting: Student

## 2022-12-12 VITALS — BP 110/60 | HR 93 | Ht 71.0 in | Wt 191.0 lb

## 2022-12-12 DIAGNOSIS — I10 Essential (primary) hypertension: Secondary | ICD-10-CM

## 2022-12-12 DIAGNOSIS — I4821 Permanent atrial fibrillation: Secondary | ICD-10-CM

## 2022-12-12 DIAGNOSIS — Z79899 Other long term (current) drug therapy: Secondary | ICD-10-CM | POA: Diagnosis not present

## 2022-12-12 DIAGNOSIS — I350 Nonrheumatic aortic (valve) stenosis: Secondary | ICD-10-CM

## 2022-12-12 DIAGNOSIS — I5032 Chronic diastolic (congestive) heart failure: Secondary | ICD-10-CM | POA: Diagnosis not present

## 2022-12-12 DIAGNOSIS — D500 Iron deficiency anemia secondary to blood loss (chronic): Secondary | ICD-10-CM | POA: Diagnosis not present

## 2022-12-12 DIAGNOSIS — I2510 Atherosclerotic heart disease of native coronary artery without angina pectoris: Secondary | ICD-10-CM | POA: Diagnosis not present

## 2022-12-12 DIAGNOSIS — E785 Hyperlipidemia, unspecified: Secondary | ICD-10-CM

## 2022-12-12 MED ORDER — TORSEMIDE 20 MG PO TABS: 60.0000 mg | ORAL_TABLET | Freq: Every day | ORAL | 3 refills | Status: DC

## 2022-12-12 NOTE — Patient Instructions (Signed)
Medication Instructions:   Decrease Torsemide to 60 mg Daily   *If you need a refill on your cardiac medications before your next appointment, please call your pharmacy*   Lab Work: Your physician recommends that you return for lab work in: 1 month   If you have labs (blood work) drawn today and your tests are completely normal, you will receive your results only by: MyChart Message (if you have MyChart) OR A paper copy in the mail If you have any lab test that is abnormal or we need to change your treatment, we will call you to review the results.   Testing/Procedures: None    Follow-Up: At [facility], you and your health needs are our priority.  As part of our continuing mission to provide you with exceptional heart care, we have created designated Provider Care Teams.  These Care Teams include your primary Cardiologist (physician) and Advanced Practice Providers (APPs -  Physician Assistants and Nurse Practitioners) who all work together to provide you with the care you need, when you need it.  We recommend signing up for the patient portal called "MyChart".  Sign up information is provided on this After Visit Summary.  MyChart is used to connect with patients for Virtual Visits (Telemedicine).  Patients are able to view lab/test results, encounter notes, upcoming appointments, etc.  Non-urgent messages can be sent to your provider as well.   To learn more about what you can do with MyChart, go to [email or web address].    Your next appointment:   3 -4 month(s)  Provider:   You may see [name], MD or one of the following Advanced Practice Providers on your designated Care Team:   Randall An, PA-C  Jacolyn Reedy, PA-C     Other Instructions Thank you for choosing [facility]!

## 2022-12-12 NOTE — Progress Notes (Signed)
Cardiology Office Note    Date:  12/12/2022  ID:  Leonard Cisneros, DOB [birth year], MRN [id number] Cardiologist: [name], MD    History of Present Illness:    Leonard Cisneros is a 82 y.o. male with past medical history of CAD (s/p CABG in 2003 with cath in 2017 showing patent LIMA-LAD, patent RIMA-dRCA, and 99% stenosis of SVG-RI treated with BMS, low-risk NST in 10/2018 and 11/2021, cath in 09/2022 showing patent LIMA-LAD and RIMA-PDA with occluded SVG-RI which appeared to be chronic and med management recommended), permanent atrial fibrillation, aortic stenosis (mild to moderate by cath in 09/2022), HTN and HLD who presents to the office today for hospital follow-up.  He was hospitalized at [facility] in 09/2022 for an acute CHF exacerbation and underwent a repeat cardiac catheterization with details as outlined below. Was evaluated by [name], NP on 11/16/2022 and reported worsening dyspnea on exertion, fatigue and lower extremity edema with his weight having increased by 14 pounds within the past month. He was sent to [facility] for likely admission and was ultimately transferred to [facility] on 11/18/2022 for management of his CHF exacerbation and symptomatic anemia. He underwent EGD which showed gastritis and nonbleeding duodenal polyps. GI did recommend holding Eliquis for 3 days and then resuming. He diuresed well with IV Lasix and weight at the time of discharge on 11/22/2022 was at 210 lbs. He was restarted on Torsemide 80 mg daily at the time of discharge. Toprol-XL was reduced to 25 mg daily due to hypotension and Aldactone 12.5 mg daily was held due to this as well.  In talking with the patient today, he reports he has overall been feeling well over the past several weeks. He enjoyed working outside last week when the weather was nice and denies any chest pain or dyspnea on exertion with this. No specific orthopnea, PND or pitting edema. He has been following his weight  at home and says this has declined by over 20 pounds within the past few months given his hospitalizations and dietary changes. He denies any recurrent melena.  Has not noticed any hematochezia or hematuria.  Studies Reviewed:   EKG: EKG is not ordered today.   Echocardiogram: 09/2022 IMPRESSIONS     1. Left ventricular ejection fraction, by estimation, is 55 to 60%. The  left ventricle has normal function. The left ventricle has no regional  wall motion abnormalities. There is moderate concentric left ventricular  hypertrophy. Left ventricular  diastolic parameters are indeterminate.   2. Right ventricular systolic function is normal. The right ventricular  size is mildly enlarged. Tricuspid regurgitation signal is inadequate for  assessing PA pressure.   3. Left atrial size was severely dilated.   4. Right atrial size was mildly dilated.   5. Left pleural effusion noted.   6. The mitral valve is degenerative. Mild mitral valve regurgitation.  Severe mitral annular calcification.   7. The aortic valve is tricuspid. There is moderate calcification of the  aortic valve. Aortic valve regurgitation is not visualized. Moderate to  severe aortic valve stenosis. Aortic valve mean gradient measures 24.0  mmHg. Dimentionless index 0.34.   8. Aortic dilatation noted. There is mild dilatation of the ascending  aorta, measuring 39 mm.   9. The inferior vena cava is dilated in size with <50% respiratory  variability, suggesting right atrial pressure of 15 mmHg.   Comparison(s): Prior images reviewed side by side. LVEF 55-60% range.  Degenerative, calcific aortic stenosis of  moderate to severe range with  similar gradients as prior study.    R/LHC: 09/2022 Ost LAD to Prox LAD lesion is 100% stenosed.   Ramus lesion is 100% stenosed.   Prox RCA to Mid RCA lesion is 60% stenosed.   Dist RCA lesion is 90% stenosed.   Origin to Prox Graft lesion is 100% stenosed.   LIMA graft was visualized  by angiography and is large.   RIMA graft was visualized by non-selective angiography and is normal in caliber.   The graft exhibits no disease.   The graft exhibits no disease.   LV end diastolic pressure is mildly elevated.   Hemodynamic findings consistent with mild pulmonary hypertension.   There is moderate aortic valve stenosis.   3 vessel occlusive CAD. The native LCx is widely patent LIMA to the LAD is patent RIMA to the PDA is patent SVG to the ramus intermediate is occluded. This appears to be chronic Mild to Moderate aortic stenosis. AV mean gradient 18.5 mm Hg. AVA 2.5 cm squared with index 1.15. Elevated LV filling pressures. LVEDP 20 mm Hg. Mean PCWP 24 mm Hg with large V waves to 42 mm Hg Mild pulmonary HTN. PAP 56/17 with mean 31 mm Hg.    Plan: medical management. Continue diuresis. Would benefit greatly from correction of severe anemia. Will need to monitor Aortic stenosis serially going forward.     Physical Exam:   VS:  BP 110/60   Pulse 93   Ht 5\' 11"  (1.803 m)   Wt 191 lb (86.6 kg)   SpO2 95%   BMI 26.64 kg/m    Wt Readings from Last 3 Encounters:  12/12/22 191 lb (86.6 kg)  11/29/22 200 lb 1.9 oz (90.8 kg)  11/22/22 210 lb 12.8 oz (95.6 kg)     GEN: Pleasant, elderly male appearing in no acute distress NECK: No JVD; No carotid bruits CARDIAC: Irregular irregular, 2/6 SEM along right upper sternal border. RESPIRATORY:  Clear to auscultation without rales, wheezing or rhonchi  ABDOMEN: Appears non-distended. No obvious abdominal masses. EXTREMITIES: No clubbing or cyanosis. No pitting edema. Distal pedal pulses are 2+ bilaterally.   Assessment and Plan:   1. CAD - He is s/p CABG in 2003 with cath in 2017 showing patent LIMA-LAD, patent RIMA-dRCA, and 99% stenosis of SVG-RI treated with BMS. Most recent cath in 09/2022 showed patent LIMA-LAD and RIMA-PDA with occluded SVG-RI which appeared to be chronic and medical management was recommended.  - He  denies any recent anginal symptoms. - Continue current medical therapy with Atorvastatin 80 mg daily, Toprol-XL 25 mg daily and PRN SL NTG. He is not on ASA given the need for anticoagulation.  2. HFpEF - He appears euvolemic on examination today and is concerned that he might be on too strong of a diuretic given his weight loss over the past few months. Since his weight has been stable at home, I recommended that we reduce Torsemide from 80 mg daily to 60 mg daily. He will follow weights at home and report back if this increases by more than 3 pounds overnight or 5 pounds in 1 week. Will recheck a BMET next month to reassess his kidney function and electrolytes. Could consider adding back an SGLT2 inhibitor in the future if he has recurrent issues with fluid retention (previously on Farxiga briefly but no longer listed). No longer on Spironolactone due to intermittent hypotension.   3. Permanent Atrial Fibrillation - He denies any recent palpitations and his  heart rate is well-controlled in the 80's to 90's during today's visit. Continue Toprol-XL 25 mg daily for rate control. - No reports of active bleeding. He remains on Eliquis 5 mg twice daily for anticoagulation which is the appropriate dose at this time given his age, weight and renal function.  4. Aortic Stenosis - This was mild to moderate by cath in 09/2022 with AV mean gradient at 18.5 mmHg. Would plan for repeat imaging in 6 months.  5. HTN - His blood pressure is well-controlled at 110/60 during today's visit. Continue current medical therapy with Toprol-XL 25 mg daily. He is no longer on Spironolactone given soft BP.  6. HLD - LDL was at 31 on most recent check and LPa was at 25.3 in 09/2022. Continue Atorvastatin 80mg  daily.   7. Anemia/Abnormal LFT's - LFT's on 3/28 showed Alk Phos was at 512 (chronically elevated since 11/2021) with AST 92 and ALT 73. Hgb was at 8.9. Recent EGD showed gastritis and a non-bleeding duodenal ulcer.  He has already been referred to GI by his PCP.   Signed, Ellsworth LennoxBrittany M Rolando Whitby, PA-C

## 2022-12-13 DIAGNOSIS — I4821 Permanent atrial fibrillation: Secondary | ICD-10-CM | POA: Diagnosis not present

## 2022-12-13 DIAGNOSIS — I739 Peripheral vascular disease, unspecified: Secondary | ICD-10-CM | POA: Diagnosis not present

## 2022-12-13 DIAGNOSIS — K2990 Gastroduodenitis, unspecified, without bleeding: Secondary | ICD-10-CM | POA: Diagnosis not present

## 2022-12-13 DIAGNOSIS — I2510 Atherosclerotic heart disease of native coronary artery without angina pectoris: Secondary | ICD-10-CM | POA: Diagnosis not present

## 2022-12-13 DIAGNOSIS — D500 Iron deficiency anemia secondary to blood loss (chronic): Secondary | ICD-10-CM | POA: Diagnosis not present

## 2022-12-13 DIAGNOSIS — K2970 Gastritis, unspecified, without bleeding: Secondary | ICD-10-CM | POA: Diagnosis not present

## 2022-12-13 DIAGNOSIS — K269 Duodenal ulcer, unspecified as acute or chronic, without hemorrhage or perforation: Secondary | ICD-10-CM | POA: Diagnosis not present

## 2022-12-13 DIAGNOSIS — I4892 Unspecified atrial flutter: Secondary | ICD-10-CM | POA: Diagnosis not present

## 2022-12-13 DIAGNOSIS — K922 Gastrointestinal hemorrhage, unspecified: Secondary | ICD-10-CM | POA: Diagnosis not present

## 2022-12-13 DIAGNOSIS — I350 Nonrheumatic aortic (valve) stenosis: Secondary | ICD-10-CM | POA: Diagnosis not present

## 2022-12-13 DIAGNOSIS — I5033 Acute on chronic diastolic (congestive) heart failure: Secondary | ICD-10-CM | POA: Diagnosis not present

## 2022-12-13 DIAGNOSIS — G629 Polyneuropathy, unspecified: Secondary | ICD-10-CM | POA: Diagnosis not present

## 2022-12-13 DIAGNOSIS — F1010 Alcohol abuse, uncomplicated: Secondary | ICD-10-CM | POA: Diagnosis not present

## 2022-12-13 DIAGNOSIS — I5020 Unspecified systolic (congestive) heart failure: Secondary | ICD-10-CM | POA: Diagnosis not present

## 2022-12-13 DIAGNOSIS — I25810 Atherosclerosis of coronary artery bypass graft(s) without angina pectoris: Secondary | ICD-10-CM | POA: Diagnosis not present

## 2022-12-13 DIAGNOSIS — K921 Melena: Secondary | ICD-10-CM | POA: Diagnosis not present

## 2022-12-13 DIAGNOSIS — D539 Nutritional anemia, unspecified: Secondary | ICD-10-CM | POA: Diagnosis not present

## 2022-12-13 DIAGNOSIS — E8770 Fluid overload, unspecified: Secondary | ICD-10-CM | POA: Diagnosis not present

## 2022-12-13 DIAGNOSIS — G894 Chronic pain syndrome: Secondary | ICD-10-CM | POA: Diagnosis not present

## 2022-12-13 DIAGNOSIS — H9193 Unspecified hearing loss, bilateral: Secondary | ICD-10-CM | POA: Diagnosis not present

## 2022-12-13 DIAGNOSIS — E876 Hypokalemia: Secondary | ICD-10-CM | POA: Diagnosis not present

## 2022-12-13 DIAGNOSIS — I110 Hypertensive heart disease with heart failure: Secondary | ICD-10-CM | POA: Diagnosis not present

## 2022-12-13 DIAGNOSIS — E785 Hyperlipidemia, unspecified: Secondary | ICD-10-CM | POA: Diagnosis not present

## 2022-12-13 DIAGNOSIS — I272 Pulmonary hypertension, unspecified: Secondary | ICD-10-CM | POA: Diagnosis not present

## 2022-12-16 ENCOUNTER — Other Ambulatory Visit: Payer: Self-pay | Admitting: Cardiology

## 2022-12-20 ENCOUNTER — Encounter: Payer: Self-pay | Admitting: Gastroenterology

## 2022-12-20 ENCOUNTER — Ambulatory Visit (INDEPENDENT_AMBULATORY_CARE_PROVIDER_SITE_OTHER): Payer: Medicare HMO | Admitting: Gastroenterology

## 2022-12-20 ENCOUNTER — Other Ambulatory Visit: Payer: Self-pay | Admitting: Cardiology

## 2022-12-20 VITALS — BP 109/68 | HR 123 | Temp 97.9°F | Ht 71.0 in | Wt 193.8 lb

## 2022-12-20 DIAGNOSIS — K269 Duodenal ulcer, unspecified as acute or chronic, without hemorrhage or perforation: Secondary | ICD-10-CM | POA: Diagnosis not present

## 2022-12-20 DIAGNOSIS — R630 Anorexia: Secondary | ICD-10-CM | POA: Diagnosis not present

## 2022-12-20 DIAGNOSIS — R634 Abnormal weight loss: Secondary | ICD-10-CM

## 2022-12-20 DIAGNOSIS — D649 Anemia, unspecified: Secondary | ICD-10-CM

## 2022-12-20 DIAGNOSIS — R7989 Other specified abnormal findings of blood chemistry: Secondary | ICD-10-CM | POA: Diagnosis not present

## 2022-12-20 NOTE — Patient Instructions (Addendum)
Will recheck her hemoglobin in 1 week.  Please take lab slips with you to the lab to ensure they draw the correct test.  Please have blood work completed at [facility].  We will call you with results once they have been received. Please allow 3-5 business days for review. 2 locations for [facility] in [place]:              1. [address], [place]              2. [address] [place]   Continue taking the pantoprazole 40 mg twice daily.  Take this 30 minutes before breakfast and 30 minutes before dinner.  It is best taken on empty stomach.  You will continue this for 8 more weeks and then reduce to once daily (breakfast).  Continue your current supply of Sucralfate solution and then you may stop.   Monitor weight weekly.  If you continue to have drops in weight please let us know.  We will plan to follow-up in 6 months, sooner if needed.  It was a pleasure to see you today. I want to create trusting relationships with patients. If you receive a survey regarding your visit,  I greatly appreciate you taking time to fill this out on paper or through your MyChart. I value your feedback.  Brooke Bonito, MSN, FNP-BC, AGACNP-BC [facility]

## 2022-12-20 NOTE — Progress Notes (Signed)
GI Office Note    Referring Provider: [name], FNP Primary Care Physician:  [name], FNP  Primary Gastroenterologist: Gerrit Friends.Rourk, MD  Chief Complaint   Chief Complaint  Patient presents with   New Patient (Initial Visit)    Referred for Duodenal ulcer   History of Present Illness   Leonard Cisneros is a 82 y.o. male presenting today at the request of [name], FNP for follow up of duodenal ulcer.  Colonoscopy in January 2019: -entire colon normal -no repeat colonoscopy due to age  Abdominal US 09/24/22 IMPRESSION: -Cholelithiasis without evidence of acute cholecystitis or biliary dilatation. -Probable fatty infiltration of liver as above. -RIGHT pleural effusion.  Patient originally presented to [facility] with shortness of breath, weight gain, edema, and melena and was transferred to [facility] for GI evaluation.  His hemoglobin at that time was 7.8.  He was started on Lasix for CHF exacerbation.  Anemia panel was unremarkable.  He was given 1 unit of PRBCs while inpatient and started on PPI.  He underwent EGD as noted below foe evaluation of heme positive stool and melena.   EGD 11/21/2022 at [facility]: -Z-line regular -Normal esophagus -Gastritis s/p biopsy -Nonbleeding duodenal ulcer with clean ulcer base -Mucosal nodule in the duodenum s/p biopsy -Duodenal nodule with polypoid fragment of benign small bowel mucosa with focal foveolar metaplasia suggestive of peptic injury -Gastric biopsies negative for H. pylori or intestinal metaplasia. -Advised to avoid NSAIDs, resume Eliquis in 3 days and use PPI twice daily for 3 months.  Today: He is no longer having melena. No abdominal pain. Appetite is not good. Has not had a good appetite since December. Reports he has lost from 218 to 193. He is unsure if treatment for swelling has to do with his weight loss. He is a happy with the weight loss. No reflux and no dysphagia. No nausea or vomiting. No  NSAIDs.   Wife feels as though his weight is leveling off and not continuing to lose weight   Was having some constipation but this has improved. That occurred after the hospitalization. Going everyday currently.  No lower abdominal pain, diarrhea, BRBPR.  Biggest complaint has been drainage and mucous production.   States he had a hgb rechecked about 1 week after his hospitalization.   Has chronic back pain.  Wt Readings from Last 3 Encounters:  12/20/22 193 lb 12.8 oz (87.9 kg)  12/12/22 191 lb (86.6 kg)  11/29/22 200 lb 1.9 oz (90.8 kg)    Reviewed most recent labs 11/29/22: Alk phos 512, AST 92, ALT 73, Tbili 2.2   Current Outpatient Medications  Medication Sig Dispense Refill   apixaban (ELIQUIS) 5 MG TABS tablet TAKE 1 TABLET BY MOUTH TWICE A DAY 180 tablet 1   atorvastatin (LIPITOR) 80 MG tablet TAKE 1 TABLET BY MOUTH EVERY DAY 90 tablet 1   Iron, Ferrous Sulfate, 325 (65 Fe) MG TABS Take 325 mg by mouth daily. 30 tablet 2   isosorbide mononitrate (IMDUR) 30 MG 24 hr tablet Take 30 mg by mouth daily.     KLOR-CON M10 10 MEQ tablet TAKE 3 TABLETS BY MOUTH DAILY. 270 tablet 1   metoprolol succinate (TOPROL-XL) 25 MG 24 hr tablet Take 1 tablet (25 mg total) by mouth daily. 90 tablet 1   nitroGLYCERIN (NITROSTAT) 0.4 MG SL tablet Place 1 tablet (0.4 mg total) under the tongue every 5 (five) minutes x 3 doses as needed for chest pain (if no  relief after 3rd dose, proceed to the ED for an evaluation or call 911). 25 tablet 3   pantoprazole (PROTONIX) 40 MG tablet Take 1 tablet (40 mg total) by mouth 2 (two) times daily. 180 tablet 0   sucralfate (CARAFATE) 1 GM/10ML suspension Take 10 mLs (1 g total) by mouth in the morning, at noon, and at bedtime. 420 mL 2   torsemide (DEMADEX) 20 MG tablet Take 3 tablets (60 mg total) by mouth daily. 270 tablet 3   No current facility-administered medications for this visit.    Past Medical History:  Diagnosis Date   Aortic stenosis     Atrial flutter    a. s/p remote ablation by Dr. Ladona Ridgel.   CAD (coronary artery disease)    a. s/p CABG 2003. b. 01/2016: unstable angina - s/p BMS to SVG-ramus intermediate, patent LIMA-mLAD, patent RIMA-dRCA.    Chronic lower back pain    Essential hypertension    Hyperlipidemia    Ischemic cardiomyopathy    a. Cath 01/2016 -  EF 50-55% with mild distal inferior hypocontractility.   Peripheral neuropathy 04/04/2014   Permanent atrial fibrillation     Past Surgical History:  Procedure Laterality Date   BACK SURGERY     BIOPSY  11/21/2022   Procedure: BIOPSY;  Surgeon: Napoleon Form, MD;  Location: [facility];  Service: Gastroenterology;;   CARDIAC CATHETERIZATION  ~ 2000; 2003   CARDIAC CATHETERIZATION N/A 02/01/2016   Procedure: Left Heart Cath and Cors/Grafts Angiography;  Surgeon: Lennette Bihari, MD;  Location: [facility] INVASIVE CV LAB;  Service: Cardiovascular;  Laterality: N/A;   CARDIAC CATHETERIZATION N/A 02/01/2016   Procedure: Coronary Stent Intervention;  Surgeon: Lennette Bihari, MD;  Location: [facility] INVASIVE CV LAB;  Service: Cardiovascular;  Laterality: N/A;   CATARACT EXTRACTION W/PHACO Left 02/09/2019   Procedure: CATARACT EXTRACTION PHACO AND INTRAOCULAR LENS PLACEMENT (IOC);  Surgeon: Fabio Pierce, MD;  Location: [facility] ORS;  Service: Ophthalmology;  Laterality: Left;  CDE: 27.70   CATARACT EXTRACTION W/PHACO Right 02/23/2019   Procedure: CATARACT EXTRACTION PHACO AND INTRAOCULAR LENS PLACEMENT RIGHT EYE;  Surgeon: Fabio Pierce, MD;  Location: [facility] ORS;  Service: Ophthalmology;  Laterality: Right;  CDE: 10.15   COLONOSCOPY  06/04/2002   Normal colon and rectum   COLONOSCOPY  07/08/2012   Procedure: COLONOSCOPY;  Surgeon: Corbin Ade, MD;  Location: [facility]O SU[facility];  Service: Endoscopy;  Laterality: N/A;  11:30   COLONOSCOPY N/A 10/02/2017   Procedure: COLONOSCOPY;  Surgeon: Corbin Ade, MD;  Location: [facility];  Service: Endoscopy;  Laterality: N/A;  10:30am   CORONARY  ANGIOPLASTY WITH STENT PLACEMENT  02/01/2016   CORONARY ARTERY BYPASS GRAFT  2003   LIMA to LAD,LIMA to distal RCA,SVG to ramus intermediate vessel.   ESOPHAGOGASTRODUODENOSCOPY (EGD) WITH PROPOFOL N/A 11/21/2022   Procedure: ESOPHAGOGASTRODUODENOSCOPY (EGD) WITH PROPOFOL;  Surgeon: Napoleon Form, MD;  Location: [facility];  Service: Gastroenterology;  Laterality: N/A;   FEMORAL REVISION Right 03/30/2019   Procedure: Femoral revision right total hip arthroplasty;  Surgeon: Ollen Gross, MD;  Location: [facility] ORS;  Service: Orthopedics;  Laterality: Right;    FOREIGN BODY REMOVAL Right 04/02/2014   Procedure: FOREIGN BODY REMOVAL RIGHT FOOT;  Surgeon: Dalia Heading, MD;  Location: [facility] ORS;  Service: General;  Laterality: Right;   JOINT REPLACEMENT     JOINT REPLACEMENT     LUMBAR DISC SURGERY     REVISION TOTAL HIP ARTHROPLASTY Bilateral 2005-2012   right-left  RIGHT/LEFT HEART CATH AND CORONARY/GRAFT ANGIOGRAPHY N/A 09/27/2022   Procedure: RIGHT/LEFT HEART CATH AND CORONARY/GRAFT ANGIOGRAPHY;  Surgeon: Swaziland, Peter M, MD;  Location: [facility] INVASIVE CV LAB;  Service: Cardiovascular;  Laterality: N/A;   SHOULDER OPEN ROTATOR CUFF REPAIR Right    TOTAL HIP ARTHROPLASTY Right 1999   TOTAL HIP ARTHROPLASTY Left 2008   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    Family History  Problem Relation Age of Onset   Heart attack Brother    Colon cancer Neg Hx     Allergies as of 12/20/2022 - Review Complete 12/20/2022  Allergen Reaction Noted   Augmentin [amoxicillin-pot clavulanate] Nausea And Vomiting and Other (See Comments) 01/14/2013    Social History   Socioeconomic History   Marital status: Married    Spouse name: Larita Fife   Number of children: 2   Years of education: Not on file   Highest  education level: Bachelor's degree (e.g., BA, AB, BS)  Occupational History   Occupation: PT Holiday representative: RETIRED  Tobacco Use   Smoking status: Former    Packs/day: 0.50    Years: 2.00    Additional pack years: 0.00    Total pack years: 1.00    Types: Cigarettes    Quit date: 09/03/1966    Years since quitting: 56.3   Smokeless tobacco: Never  Vaping Use   Vaping Use: Never used  Substance and Sexual Activity   Alcohol use: Yes    Alcohol/week: 11.0 standard drinks of alcohol    Types: 7 Glasses of wine, 4 Cans of beer per week    Comment: a glass of wine with dinner/ beer on the weekend   Drug use: No   Sexual activity: Not on file  Other Topics Concern   Not on file  Social History Narrative   Lives with his wife.   Social Determinants of Health   Financial Resource Strain: Low Risk  (11/25/2022)   Overall Financial Resource Strain (CARDIA)    Difficulty of Paying Living Expenses: Not hard at all  Food Insecurity: No Food Insecurity (11/28/2022)   Hunger Vital Sign    Worried About Running Out of Food in the Last Year: Never true    Ran Out of Food in the Last Year: Never true  Transportation Needs: No Transportation Needs (11/28/2022)   PRAPARE - Administrator, Civil Service (Medical): No    Lack of Transportation (Non-Medical): No  Physical Activity: Unknown (11/25/2022)   Exercise Vital Sign    Days of Exercise per Week: 0 days    Minutes of Exercise per Session: Not on file  Stress: No Stress Concern Present (11/25/2022)   [employer] of Occupational Health - Occupational Stress Questionnaire    Feeling of Stress : Not at all  Social Connections: Moderately Integrated (11/25/2022)   Social Connection and Isolation Panel [NHANES]    Frequency of Communication with Friends and Family: Once a week    Frequency of Social Gatherings with Friends and Family: Once a week    Attends Religious Services: More than 4 times per year     Active Member of [employer] or Organizations: Yes    Attends Engineer, structural: More than 4 times per year    Marital Status: Married  Catering manager Violence: Not At Risk (  09/24/2022)   Humiliation, Afraid, Rape, and Kick questionnaire    Fear of Current or Ex-Partner: No    Emotionally Abused: No    Physically Abused: No    Sexually Abused: No     Review of Systems   Gen: Denies any fever, chills, fatigue, weight loss, lack of appetite.  CV: Denies chest pain, heart palpitations, peripheral edema, syncope.  Resp: Denies shortness of breath at rest or with exertion. Denies wheezing or cough.  GI: see HPI GU : Denies urinary burning, urinary frequency, urinary hesitancy MS: Denies joint pain, muscle weakness, cramps, or limitation of movement.  Derm: Denies rash, itching, dry skin Psych: Denies depression, anxiety, memory loss, and confusion Heme: Denies bruising, bleeding, and enlarged lymph nodes.   Physical Exam   BP 109/68   Pulse (!) 123   Temp 97.9 F (36.6 C)   Ht  (1.803 m)   Wt 193 lb 12.8 oz (87.9 kg)   BMI 27.03 kg/m   General:   Alert and oriented. Pleasant and cooperative. Well-nourished and well-developed.  Head:  Normocephalic and atraumatic. Eyes:  Without icterus, sclera clear and conjunctiva pink.  Ears:  Normal auditory acuity. Mouth:  No deformity or lesions, oral mucosa pink.  Lungs:  Clear to auscultation bilaterally. No wheezes, rales, or rhonchi. No distress.  Heart: Irregularly irregular Abdomen:  +BS, soft, non-tender and non-distended. No HSM noted. No guarding or rebound. No masses appreciated.  Rectal:  Deferred  Msk: Unstable gait, uses bilateral crutches. Extremities:  Without edema. Neurologic:  Alert and  oriented x4;  grossly normal neurologically. Skin:  Intact without significant lesions or rashes. Psych:  Alert and cooperative. Normal mood and affect.   Assessment   Leonard Cisneros is a 82 y.o. male with a  history of CAD s/p CABG in 2003 and placement of bare metal stent in 2017, HTN, HLD, aflutter s/p ablation, ischemic cardiomyopathy, aortic stenosis, CHF presenting today for hospital follow up on anemia.   Duodenal ulcer, anemia, lack of appetite, weight loss: Hgb 1 week post discharge stable. Recent EGD with evidence of gastritis, non bleeding duodenal ulcer, and benign duodenal nodule. No evidence of H. Pylori. Melena likely secondary to PUD in the setting of chronic anticoagulation. No further reports of melena since hospitalizations and he has been compliant with PPI BID and carafate QID. Denies any N/V, dysphagia. Still without much appetite but weight remains stable. Advised to monitor weight weekly and notify for large drops in weight. Will check Hgb in 1 week to ensure ongoing improvement. He will continue PPI BID and iron daily.   Elevated LFTs and alk phos: Chronically elevated alk phos  and mild elevation in T.bili. dating back to March 2023. Abdominal US in January 2024 with fatty infiltration and patent doppler and evidence of cholelithiasis without cholecystitis. No evidence of thrombocytopenia. Drinks alcohol occasionally on the weekends (glass of wine). No juandice or pruritis noted. Will reassess liver enzymes at time of repeat CBC. Will order additional serologies and/or imaging if persistent elevation.   PLAN   CBC and HFP in 1 week.  Continue iron daily.  Continue pantoprazole 40 mg twice daily for 8 more weeks then reduce to once daily.  GERD diet Continue sucralfate until medication supply finished.  May consider additional imaging of liver/gallbladder (CT) pending results of HFP. Monitor weight weekly Follow up in 6 months.    Brooke Bonito, MSN, FNP-BC, AGACNP-BC [facility]

## 2022-12-20 NOTE — Telephone Encounter (Signed)
Prescription refill request for Eliquis received. Indication: AF Last office visit: 12/12/22  B Strader PA-C Scr: 0.96 on 11/29/22  Epic Age: 82 Weight: 86.6kg  Based on above findings Eliquis  twice daily is the appropriate dose.  Refill approved.

## 2022-12-25 ENCOUNTER — Inpatient Hospital Stay (HOSPITAL_COMMUNITY)
Admission: EM | Admit: 2022-12-25 | Discharge: 2022-12-29 | DRG: 377 | Disposition: A | Discharge status: Home Health | Payer: Medicare HMO | Attending: Family Medicine | Admitting: Family Medicine

## 2022-12-25 ENCOUNTER — Other Ambulatory Visit: Payer: Self-pay

## 2022-12-25 ENCOUNTER — Emergency Department (HOSPITAL_COMMUNITY): Payer: Medicare HMO

## 2022-12-25 ENCOUNTER — Encounter (HOSPITAL_COMMUNITY): Payer: Self-pay | Admitting: Emergency Medicine

## 2022-12-25 ENCOUNTER — Inpatient Hospital Stay (HOSPITAL_COMMUNITY): Payer: Medicare HMO

## 2022-12-25 DIAGNOSIS — E871 Hypo-osmolality and hyponatremia: Secondary | ICD-10-CM | POA: Diagnosis present

## 2022-12-25 DIAGNOSIS — K222 Esophageal obstruction: Secondary | ICD-10-CM | POA: Diagnosis not present

## 2022-12-25 DIAGNOSIS — E872 Acidosis, unspecified: Secondary | ICD-10-CM | POA: Diagnosis not present

## 2022-12-25 DIAGNOSIS — I10 Essential (primary) hypertension: Secondary | ICD-10-CM | POA: Diagnosis not present

## 2022-12-25 DIAGNOSIS — J918 Pleural effusion in other conditions classified elsewhere: Secondary | ICD-10-CM | POA: Diagnosis not present

## 2022-12-25 DIAGNOSIS — I4892 Unspecified atrial flutter: Secondary | ICD-10-CM | POA: Diagnosis present

## 2022-12-25 DIAGNOSIS — K2990 Gastroduodenitis, unspecified, without bleeding: Secondary | ICD-10-CM | POA: Diagnosis not present

## 2022-12-25 DIAGNOSIS — D649 Anemia, unspecified: Principal | ICD-10-CM | POA: Diagnosis present

## 2022-12-25 DIAGNOSIS — Z8249 Family history of ischemic heart disease and other diseases of the circulatory system: Secondary | ICD-10-CM

## 2022-12-25 DIAGNOSIS — Z7901 Long term (current) use of anticoagulants: Secondary | ICD-10-CM

## 2022-12-25 DIAGNOSIS — I272 Pulmonary hypertension, unspecified: Secondary | ICD-10-CM | POA: Diagnosis not present

## 2022-12-25 DIAGNOSIS — I2489 Other forms of acute ischemic heart disease: Secondary | ICD-10-CM | POA: Diagnosis present

## 2022-12-25 DIAGNOSIS — N179 Acute kidney failure, unspecified: Secondary | ICD-10-CM | POA: Diagnosis not present

## 2022-12-25 DIAGNOSIS — K2971 Gastritis, unspecified, with bleeding: Secondary | ICD-10-CM | POA: Diagnosis present

## 2022-12-25 DIAGNOSIS — D62 Acute posthemorrhagic anemia: Secondary | ICD-10-CM | POA: Insufficient documentation

## 2022-12-25 DIAGNOSIS — I5A Non-ischemic myocardial injury (non-traumatic): Secondary | ICD-10-CM

## 2022-12-25 DIAGNOSIS — Z8719 Personal history of other diseases of the digestive system: Secondary | ICD-10-CM

## 2022-12-25 DIAGNOSIS — Z9861 Coronary angioplasty status: Secondary | ICD-10-CM | POA: Diagnosis not present

## 2022-12-25 DIAGNOSIS — R7989 Other specified abnormal findings of blood chemistry: Secondary | ICD-10-CM

## 2022-12-25 DIAGNOSIS — Z79899 Other long term (current) drug therapy: Secondary | ICD-10-CM

## 2022-12-25 DIAGNOSIS — I739 Peripheral vascular disease, unspecified: Secondary | ICD-10-CM | POA: Diagnosis present

## 2022-12-25 DIAGNOSIS — D500 Iron deficiency anemia secondary to blood loss (chronic): Secondary | ICD-10-CM | POA: Diagnosis not present

## 2022-12-25 DIAGNOSIS — R748 Abnormal levels of other serum enzymes: Secondary | ICD-10-CM | POA: Diagnosis not present

## 2022-12-25 DIAGNOSIS — Z87891 Personal history of nicotine dependence: Secondary | ICD-10-CM

## 2022-12-25 DIAGNOSIS — K922 Gastrointestinal hemorrhage, unspecified: Secondary | ICD-10-CM | POA: Insufficient documentation

## 2022-12-25 DIAGNOSIS — R571 Hypovolemic shock: Secondary | ICD-10-CM

## 2022-12-25 DIAGNOSIS — R079 Chest pain, unspecified: Secondary | ICD-10-CM | POA: Diagnosis not present

## 2022-12-25 DIAGNOSIS — K2970 Gastritis, unspecified, without bleeding: Secondary | ICD-10-CM | POA: Diagnosis present

## 2022-12-25 DIAGNOSIS — I4821 Permanent atrial fibrillation: Secondary | ICD-10-CM

## 2022-12-25 DIAGNOSIS — I110 Hypertensive heart disease with heart failure: Secondary | ICD-10-CM | POA: Diagnosis present

## 2022-12-25 DIAGNOSIS — R578 Other shock: Secondary | ICD-10-CM | POA: Insufficient documentation

## 2022-12-25 DIAGNOSIS — I2510 Atherosclerotic heart disease of native coronary artery without angina pectoris: Secondary | ICD-10-CM

## 2022-12-25 DIAGNOSIS — I25708 Atherosclerosis of coronary artery bypass graft(s), unspecified, with other forms of angina pectoris: Secondary | ICD-10-CM | POA: Diagnosis present

## 2022-12-25 DIAGNOSIS — Z961 Presence of intraocular lens: Secondary | ICD-10-CM | POA: Diagnosis present

## 2022-12-25 DIAGNOSIS — I25118 Atherosclerotic heart disease of native coronary artery with other forms of angina pectoris: Secondary | ICD-10-CM

## 2022-12-25 DIAGNOSIS — J9811 Atelectasis: Secondary | ICD-10-CM | POA: Diagnosis not present

## 2022-12-25 DIAGNOSIS — K269 Duodenal ulcer, unspecified as acute or chronic, without hemorrhage or perforation: Secondary | ICD-10-CM | POA: Diagnosis not present

## 2022-12-25 DIAGNOSIS — I255 Ischemic cardiomyopathy: Secondary | ICD-10-CM | POA: Diagnosis present

## 2022-12-25 DIAGNOSIS — Z9842 Cataract extraction status, left eye: Secondary | ICD-10-CM

## 2022-12-25 DIAGNOSIS — Z955 Presence of coronary angioplasty implant and graft: Secondary | ICD-10-CM

## 2022-12-25 DIAGNOSIS — Z5309 Procedure and treatment not carried out because of other contraindication: Secondary | ICD-10-CM

## 2022-12-25 DIAGNOSIS — R195 Other fecal abnormalities: Secondary | ICD-10-CM | POA: Diagnosis present

## 2022-12-25 DIAGNOSIS — I4891 Unspecified atrial fibrillation: Secondary | ICD-10-CM | POA: Diagnosis not present

## 2022-12-25 DIAGNOSIS — E785 Hyperlipidemia, unspecified: Secondary | ICD-10-CM | POA: Diagnosis present

## 2022-12-25 DIAGNOSIS — I5033 Acute on chronic diastolic (congestive) heart failure: Secondary | ICD-10-CM | POA: Diagnosis present

## 2022-12-25 DIAGNOSIS — R0902 Hypoxemia: Secondary | ICD-10-CM | POA: Diagnosis not present

## 2022-12-25 DIAGNOSIS — R945 Abnormal results of liver function studies: Secondary | ICD-10-CM | POA: Diagnosis not present

## 2022-12-25 DIAGNOSIS — J811 Chronic pulmonary edema: Secondary | ICD-10-CM | POA: Diagnosis not present

## 2022-12-25 DIAGNOSIS — Z8679 Personal history of other diseases of the circulatory system: Secondary | ICD-10-CM

## 2022-12-25 DIAGNOSIS — K8020 Calculus of gallbladder without cholecystitis without obstruction: Secondary | ICD-10-CM | POA: Diagnosis not present

## 2022-12-25 DIAGNOSIS — Z880 Allergy status to penicillin: Secondary | ICD-10-CM

## 2022-12-25 DIAGNOSIS — K264 Chronic or unspecified duodenal ulcer with hemorrhage: Secondary | ICD-10-CM | POA: Diagnosis not present

## 2022-12-25 DIAGNOSIS — J90 Pleural effusion, not elsewhere classified: Secondary | ICD-10-CM | POA: Diagnosis not present

## 2022-12-25 DIAGNOSIS — Z9841 Cataract extraction status, right eye: Secondary | ICD-10-CM

## 2022-12-25 DIAGNOSIS — R0789 Other chest pain: Secondary | ICD-10-CM | POA: Diagnosis not present

## 2022-12-25 DIAGNOSIS — Z96643 Presence of artificial hip joint, bilateral: Secondary | ICD-10-CM | POA: Diagnosis present

## 2022-12-25 DIAGNOSIS — I350 Nonrheumatic aortic (valve) stenosis: Secondary | ICD-10-CM | POA: Diagnosis present

## 2022-12-25 DIAGNOSIS — Z951 Presence of aortocoronary bypass graft: Secondary | ICD-10-CM | POA: Diagnosis not present

## 2022-12-25 DIAGNOSIS — K921 Melena: Secondary | ICD-10-CM | POA: Diagnosis not present

## 2022-12-25 LAB — TYPE AND SCREEN
Antibody Screen: POSITIVE
Unit division: 0

## 2022-12-25 LAB — CBC
HCT: 16.2 % — ABNORMAL LOW (ref 39.0–52.0)
Hemoglobin: 5 g/dL — CL (ref 13.0–17.0)
MCH: 37 pg — ABNORMAL HIGH (ref 26.0–34.0)
MCHC: 30.9 g/dL (ref 30.0–36.0)
MCV: 120 fL — ABNORMAL HIGH (ref 80.0–100.0)
Platelets: 197 10*3/uL (ref 150–400)
RBC: 1.35 MIL/uL — ABNORMAL LOW (ref 4.22–5.81)
RDW: 26.8 % — ABNORMAL HIGH (ref 11.5–15.5)
WBC: 9.4 10*3/uL (ref 4.0–10.5)
nRBC: 1.3 % — ABNORMAL HIGH (ref 0.0–0.2)

## 2022-12-25 LAB — I-STAT CHEM 8, ED
BUN: 35 mg/dL — ABNORMAL HIGH (ref 8–23)
Calcium, Ion: 1.12 mmol/L — ABNORMAL LOW (ref 1.15–1.40)
Chloride: 97 mmol/L — ABNORMAL LOW (ref 98–111)
Creatinine, Ser: 1.5 mg/dL — ABNORMAL HIGH (ref 0.61–1.24)
Glucose, Bld: 151 mg/dL — ABNORMAL HIGH (ref 70–99)
HCT: 15 % — ABNORMAL LOW (ref 39.0–52.0)
Hemoglobin: 5.1 g/dL — CL (ref 13.0–17.0)
Potassium: 4.4 mmol/L (ref 3.5–5.1)
Sodium: 133 mmol/L — ABNORMAL LOW (ref 135–145)
TCO2: 24 mmol/L (ref 22–32)

## 2022-12-25 LAB — COMPREHENSIVE METABOLIC PANEL
ALT: 43 U/L (ref 0–44)
AST: 49 U/L — ABNORMAL HIGH (ref 15–41)
Albumin: 3.1 g/dL — ABNORMAL LOW (ref 3.5–5.0)
Alkaline Phosphatase: 245 U/L — ABNORMAL HIGH (ref 38–126)
Anion gap: 12 (ref 5–15)
BUN: 35 mg/dL — ABNORMAL HIGH (ref 8–23)
CO2: 20 mmol/L — ABNORMAL LOW (ref 22–32)
Calcium: 8.3 mg/dL — ABNORMAL LOW (ref 8.9–10.3)
Chloride: 98 mmol/L (ref 98–111)
Creatinine, Ser: 1.32 mg/dL — ABNORMAL HIGH (ref 0.61–1.24)
GFR, Estimated: 54 mL/min — ABNORMAL LOW (ref >60)
Glucose, Bld: 154 mg/dL — ABNORMAL HIGH (ref 70–99)
Potassium: 4.3 mmol/L (ref 3.5–5.1)
Sodium: 130 mmol/L — ABNORMAL LOW (ref 135–145)
Total Bilirubin: 3.3 mg/dL — ABNORMAL HIGH (ref 0.3–1.2)
Total Protein: 5.9 g/dL — ABNORMAL LOW (ref 6.5–8.1)

## 2022-12-25 LAB — BPAM RBC
Blood Product Expiration Date: 202405262359
Unit Type and Rh: 5100

## 2022-12-25 LAB — GLUCOSE, CAPILLARY: Glucose-Capillary: 111 mg/dL — ABNORMAL HIGH (ref 70–99)

## 2022-12-25 LAB — POC OCCULT BLOOD, ED: Fecal Occult Bld: POSITIVE — AB

## 2022-12-25 LAB — MAGNESIUM: Magnesium: 2 mg/dL (ref 1.7–2.4)

## 2022-12-25 LAB — LACTIC ACID, PLASMA
Lactic Acid, Venous: 3.5 mmol/L (ref 0.5–1.9)
Lactic Acid, Venous: 1.7 mmol/L (ref 0.5–1.9)

## 2022-12-25 LAB — PREPARE RBC (CROSSMATCH)

## 2022-12-25 LAB — BILIRUBIN, FRACTIONATED(TOT/DIR/INDIR)
Bilirubin, Direct: 1 mg/dL — ABNORMAL HIGH (ref 0.0–0.2)
Indirect Bilirubin: 2.2 mg/dL — ABNORMAL HIGH (ref 0.3–0.9)
Total Bilirubin: 3.2 mg/dL — ABNORMAL HIGH (ref 0.3–1.2)

## 2022-12-25 LAB — TROPONIN I (HIGH SENSITIVITY)
Troponin I (High Sensitivity): 216 ng/L (ref <18)
Troponin I (High Sensitivity): 220 ng/L (ref <18)

## 2022-12-25 LAB — BRAIN NATRIURETIC PEPTIDE: B Natriuretic Peptide: 658 pg/mL — ABNORMAL HIGH (ref 0.0–100.0)

## 2022-12-25 MED ORDER — BISACODYL 10 MG RE SUPP: 10.0000 mg | Freq: Every day | RECTAL | Status: DC | PRN

## 2022-12-25 MED ORDER — TRAZODONE HCL 50 MG PO TABS
50.0000 mg | ORAL_TABLET | Freq: Every evening | ORAL | Status: DC | PRN
  Administered 2022-12-25 – 2022-12-28 (×4): 50 mg via ORAL
  Filled 2022-12-25 (×4): qty 1

## 2022-12-25 MED ORDER — SODIUM CHLORIDE 0.9% FLUSH
3.0000 mL | Freq: Two times a day (BID) | INTRAVENOUS | Status: DC
  Administered 2022-12-25 – 2022-12-29 (×8): 3 mL via INTRAVENOUS

## 2022-12-25 MED ORDER — FUROSEMIDE 10 MG/ML IJ SOLN
40.0000 mg | Freq: Once | INTRAMUSCULAR | Status: DC
  Filled 2022-12-25: qty 4

## 2022-12-25 MED ORDER — PANTOPRAZOLE SODIUM 40 MG IV SOLR
40.0000 mg | Freq: Two times a day (BID) | INTRAVENOUS | Status: DC
  Administered 2022-12-25 – 2022-12-29 (×8): 40 mg via INTRAVENOUS
  Filled 2022-12-25 (×8): qty 10

## 2022-12-25 MED ORDER — ONDANSETRON HCL 4 MG PO TABS: 4.0000 mg | ORAL_TABLET | Freq: Four times a day (QID) | ORAL | Status: DC | PRN

## 2022-12-25 MED ORDER — ACETAMINOPHEN 325 MG PO TABS
650.0000 mg | ORAL_TABLET | Freq: Four times a day (QID) | ORAL | Status: DC | PRN
  Administered 2022-12-27: 650 mg via ORAL
  Filled 2022-12-25: qty 2

## 2022-12-25 MED ORDER — SODIUM CHLORIDE 0.9 % IV SOLN: INTRAVENOUS | Status: DC | PRN

## 2022-12-25 MED ORDER — PANTOPRAZOLE SODIUM 40 MG IV SOLR
40.0000 mg | Freq: Once | INTRAVENOUS | Status: AC
  Administered 2022-12-25: 40 mg via INTRAVENOUS
  Filled 2022-12-25: qty 10

## 2022-12-25 MED ORDER — ATORVASTATIN CALCIUM 40 MG PO TABS
80.0000 mg | ORAL_TABLET | Freq: Every day | ORAL | Status: DC
  Administered 2022-12-25 – 2022-12-29 (×5): 80 mg via ORAL
  Filled 2022-12-25 (×5): qty 2

## 2022-12-25 MED ORDER — METOPROLOL SUCCINATE ER 25 MG PO TB24: 25.0000 mg | ORAL_TABLET | Freq: Every day | ORAL | Status: DC

## 2022-12-25 MED ORDER — SODIUM CHLORIDE 0.9% IV SOLUTION: Freq: Once | INTRAVENOUS | Status: AC

## 2022-12-25 MED ORDER — SODIUM CHLORIDE 0.9% FLUSH
3.0000 mL | Freq: Two times a day (BID) | INTRAVENOUS | Status: DC
  Administered 2022-12-25 – 2022-12-29 (×6): 3 mL via INTRAVENOUS

## 2022-12-25 MED ORDER — CHLORHEXIDINE GLUCONATE CLOTH 2 % EX PADS
6.0000 | MEDICATED_PAD | Freq: Every day | CUTANEOUS | Status: DC
  Administered 2022-12-26 – 2022-12-29 (×4): 6 via TOPICAL

## 2022-12-25 MED ORDER — ONDANSETRON HCL 4 MG/2ML IJ SOLN: 4.0000 mg | Freq: Four times a day (QID) | INTRAMUSCULAR | Status: DC | PRN

## 2022-12-25 MED ORDER — ACETAMINOPHEN 650 MG RE SUPP: 650.0000 mg | Freq: Four times a day (QID) | RECTAL | Status: DC | PRN

## 2022-12-25 MED ORDER — SODIUM CHLORIDE 0.9 % IV BOLUS
500.0000 mL | Freq: Once | INTRAVENOUS | Status: AC
  Administered 2022-12-25: 500 mL via INTRAVENOUS

## 2022-12-25 MED ORDER — SUCRALFATE 1 GM/10ML PO SUSP
1.0000 g | Freq: Three times a day (TID) | ORAL | Status: DC
  Administered 2022-12-25 – 2022-12-29 (×13): 1 g via ORAL
  Filled 2022-12-25 (×13): qty 10

## 2022-12-25 MED ORDER — POLYETHYLENE GLYCOL 3350 17 G PO PACK: 17.0000 g | PACK | Freq: Every day | ORAL | Status: DC | PRN

## 2022-12-25 MED ORDER — NOREPINEPHRINE 4 MG/250ML-% IV SOLN: 2.0000 ug/min | INTRAVENOUS | Status: DC

## 2022-12-25 MED ORDER — SODIUM CHLORIDE 0.9 % IV SOLN: 250.0000 mL | INTRAVENOUS | Status: DC

## 2022-12-25 MED ORDER — DM-GUAIFENESIN ER 30-600 MG PO TB12
1.0000 | ORAL_TABLET | Freq: Two times a day (BID) | ORAL | Status: DC
  Administered 2022-12-25 – 2022-12-29 (×8): 1 via ORAL
  Filled 2022-12-25 (×8): qty 1

## 2022-12-25 MED ORDER — SODIUM CHLORIDE 0.9% FLUSH: 3.0000 mL | INTRAVENOUS | Status: DC | PRN

## 2022-12-25 MED ORDER — ISOSORBIDE MONONITRATE ER 30 MG PO TB24: 30.0000 mg | ORAL_TABLET | Freq: Once | ORAL | Status: DC

## 2022-12-25 MED ORDER — SODIUM CHLORIDE 0.9 % IV SOLN
1.0000 g | Freq: Once | INTRAVENOUS | Status: AC
  Administered 2022-12-25: 1 g via INTRAVENOUS
  Filled 2022-12-25: qty 10

## 2022-12-25 NOTE — H&P (Addendum)
Patient Demographics:    Leonard Cisneros, is a 82 y.o. male  MRN: [id number]   DOB - [birth year]  Admit Date - 12/25/2022  Outpatient Primary MD for the patient is [name], FNP   Assessment & Plan:   Assessment and Plan:  1)Acute GI bleed with acute on chronic anemia --EGD from 11/21/2022 showed duodenal ulcer patient with melena and drop in hemoglobin (hgb 8.9 on 11/29/2022, hemoglobin today is down to 5.0) --Stool is Hemoccult positive in the ED -Elevated MCH and MCV--- B12 and folate were normal on 11/19/2022 Recent anemia panel basically normal.  EGD on 11/21/2022 with gastritis (biopsied with biopsies negative for H. pylori and negative for intestinal metaplasia), nonbleeding DU with clean ulcer base (Forrest class III) and mucosal nodule in duodenum (biopsied).  -Colonoscopy in January 2019 was unremarkable -Okay to to go ahead with transfusion of  3 units  of PRBC -GI consult requested -Dose of Eliquis was 12/25/2022 -PPI and Carafate ordered  2) chest pains with elevated troponins--patient with history of CAD with prior s/p CABG 2003. b. 01/2016: s/p BMS to SVG-ramus intermediate, patent LIMA-mLAD, patent RIMA-dRCA. --Troponin initially 216, repeat 220 --EKG shows A-fib without significant ischemic changes -Continue atorvastatin, and metoprolol  -consider restarting isosorbide as BP allows  -Cardiology consult appreciated--recommends against antiplatelet or anticoagulation at this time given GI bleed requiring transfusion  3) atrial flutter/atrial fibrillation--- status post remote ablation by Dr. Ladona Ridgel -Continue to hold Eliquis -Continue Toprol-XL for rate control  4)HFpEF--patient with dyspnea -Echo from July 2024 with EF of 55 to 60% with severe dilatation of left atrium -BNP 658---which is not too  far from prior baseline --Chest x-ray with pulmonary venous congestion and bilateral pleural effusions suggestive of pulmonary edema -IV Lasix if BP improves after transfusion -Daily weight and fluid input and output measurement  5)Transaminitis-- --LFTs are trending down from 11/29/2022 -Right upper quadrant ultrasound today with cholelithiasis without cholecystitis    Latest Ref Rng & Units 12/25/2022   10:54 AM 12/25/2022    9:19 AM 11/29/2022    2:06 PM  Hepatic Function  Total Protein 6.5 - 8.1 g/dL  5.9  6.3   Albumin 3.5 - 5.0 g/dL  3.1  3.7   AST 15 - 41 U/L  49  92   ALT 0 - 44 U/L  43  73   Alk Phosphatase 38 - 126 U/L  245  512   Total Bilirubin 0.3 - 1.2 mg/dL 3.2  3.3  2.2   Bilirubin, Direct 0.0 - 0.2 mg/dL 1.0       6) moderate to severe aortic stenosis--- cardiology consult requested  Status is: Inpatient  Remains inpatient appropriate because:   Dispo: The patient is from: Home              Anticipated d/c is to: Home              Anticipated d/c date is: 2 days  Patient currently is not medically stable to d/c. Barriers: Not Clinically Stable-  With History of - Reviewed by me  Past Medical History:  Diagnosis Date   Aortic stenosis    Atrial flutter    a. s/p remote ablation by Dr. Ladona Ridgel.   CAD (coronary artery disease)    a. s/p CABG 2003. b. 01/2016: unstable angina - s/p BMS to SVG-ramus intermediate, patent LIMA-mLAD, patent RIMA-dRCA.    Chronic lower back pain    Essential hypertension    Hyperlipidemia    Ischemic cardiomyopathy    a. Cath 01/2016 -  EF 50-55% with mild distal inferior hypocontractility.   Peripheral neuropathy 04/04/2014   Permanent atrial fibrillation       Past Surgical History:  Procedure Laterality Date   BACK SURGERY     BIOPSY  11/21/2022   Procedure: BIOPSY;  Surgeon: Napoleon Form, MD;  Location: [facility];  Service: Gastroenterology;;   CARDIAC CATHETERIZATION  ~ 2000; 2003   CARDIAC  CATHETERIZATION N/A 02/01/2016   Procedure: Left Heart Cath and Cors/Grafts Angiography;  Surgeon: Lennette Bihari, MD;  Location: [facility];  Service: Cardiovascular;  Laterality: N/A;   CARDIAC CATHETERIZATION N/A 02/01/2016   Procedure: Coronary Stent Intervention;  Surgeon: Lennette Bihari, MD;  Location: [facility];  Service: Cardiovascular;  Laterality: N/A;   CATARACT EXTRACTION W/PHACO Left 02/09/2019   Procedure: CATARACT EXTRACTION PHACO AND INTRAOCULAR LENS PLACEMENT (IOC);  Surgeon: Fabio Pierce, MD;  Location: [facility];  Service: Ophthalmology;  Laterality: Left;  CDE: 27.70   CATARACT EXTRACTION W/PHACO Right 02/23/2019   Procedure: CATARACT EXTRACTION PHACO AND INTRAOCULAR LENS PLACEMENT RIGHT EYE;  Surgeon: Fabio Pierce, MD;  Location: [facility];  Service: Ophthalmology;  Laterality: Right;  CDE: 10.15   COLONOSCOPY  06/04/2002   Normal colon and rectum   COLONOSCOPY  07/08/2012   Procedure: COLONOSCOPY;  Surgeon: Corbin Ade, MD;  Location: [facility];  Service: Endoscopy;  Laterality: N/A;  11:30   COLONOSCOPY N/A 10/02/2017   Procedure: COLONOSCOPY;  Surgeon: Corbin Ade, MD;  Location: [facility];  Service: Endoscopy;  Laterality: N/A;  10:30am   CORONARY ANGIOPLASTY WITH STENT PLACEMENT  02/01/2016   CORONARY ARTERY BYPASS GRAFT  2003   LIMA to LAD,LIMA to distal RCA,SVG to ramus intermediate vessel.   ESOPHAGOGASTRODUODENOSCOPY (EGD) WITH PROPOFOL N/A 11/21/2022   Procedure: ESOPHAGOGASTRODUODENOSCOPY (EGD) WITH PROPOFOL;  Surgeon: Napoleon Form, MD;  Location: [facility];  Service: Gastroenterology;  Laterality: N/A;   FEMORAL REVISION Right 03/30/2019   Procedure: Femoral revision right total hip arthroplasty;  Surgeon: Ollen Gross, MD;  Location: [facility];  Service: Orthopedics;  Laterality: Right;    FOREIGN BODY REMOVAL Right 04/02/2014   Procedure: FOREIGN BODY REMOVAL RIGHT FOOT;  Surgeon: Dalia Heading, MD;  Location: [facility];   Service: General;  Laterality: Right;   JOINT REPLACEMENT     JOINT REPLACEMENT     LUMBAR DISC SURGERY     REVISION TOTAL HIP ARTHROPLASTY Bilateral 2005-2012   right-left   RIGHT/LEFT HEART CATH AND CORONARY/GRAFT ANGIOGRAPHY N/A 09/27/2022   Procedure: RIGHT/LEFT HEART CATH AND CORONARY/GRAFT ANGIOGRAPHY;  Surgeon: [place], Peter M, MD;  Location: [facility];  Service: Cardiovascular;  Laterality: N/A;   SHOULDER OPEN ROTATOR CUFF REPAIR Right    TOTAL HIP ARTHROPLASTY Right 1999   TOTAL HIP ARTHROPLASTY Left 2008   US ECHOCARDIOGRAPHY  11/13/2011   LV mildly dilated,mild concentric LVH,LA mod -  severely dilated,RA mildly dilated,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    Chief Complaint  Patient presents with   Chest Pain      HPI:    Ada Woodbury  is a 82 y.o. male with PMH of CAD/CABG with recent LHC showing 3v-CAD, moderate aortic stenosis, ICM, diastolic CHF, lower back pain, A-flutter s/p ablation, permanent A-fib on Eliquis, PAD, anemia and HTN and recent EGD from 11/21/2022 showing duodenal ulcer who presents to the ED with concerns for melena -Stool is Hemoccult positive in the ED -Also reports intermittent chest pains overnight required nitroglycerin sublingual -hgb 8.9 on 11/29/2022, hemoglobin today is down to 5.0) -Troponin initially 216, repeat 220 -BNP 658---which is not too far from prior baseline -EKG shows A-fib without significant ischemic changes -LFTs are trending down from 11/29/2022 -Creatinine is up to 1.22 from baseline of 0.7-0.9 -Right upper quadrant ultrasound with cholelithiasis without cholecystitis -Chest x-ray with pulmonary venous congestion and bilateral pleural effusions suggestive of pulmonary edema   Review of systems:    In addition to the HPI above,   A full Review of  Systems was done, all other systems reviewed are negative except as noted above in HPI ,  .    Social History:  Reviewed by me    Social History   Tobacco Use   Smoking status: Former    Packs/day: 0.50    Years: 2.00    Additional pack years: 0.00    Total pack years: 1.00    Types: Cigarettes    Quit date: 09/03/1966    Years since quitting: 56.3   Smokeless tobacco: Never  Substance Use Topics   Alcohol use: Yes    Alcohol/week: 11.0 standard drinks of alcohol    Types: 7 Glasses of wine, 4 Cans of beer per week    Comment: a glass of wine with dinner/ beer on the weekend     Family History :  Reviewed by me    Family History  Problem Relation Age of Onset   Heart attack Brother    Colon cancer Neg Hx      Home Medications:   Prior to Admission medications   Medication Sig Start Date End Date Taking? Authorizing Provider  apixaban (ELIQUIS) 5 MG TABS tablet TAKE 1 TABLET BY MOUTH TWICE A DAY 12/20/22  Yes [name], MD  atorvastatin (LIPITOR) 80 MG tablet TAKE 1 TABLET BY MOUTH EVERY DAY Patient taking differently: Take 80 mg by mouth daily. 11/30/22  Yes [name], MD  Iron, Ferrous Sulfate, 325 (65 Fe) MG TABS Take 325 mg by mouth daily. 12/02/22  Yes [name], FNP  isosorbide mononitrate (IMDUR) 30 MG 24 hr tablet Take 30 mg by mouth daily. 12/02/22  Yes [provider]  KLOR-CON M10 10 MEQ tablet TAKE 3 TABLETS BY MOUTH DAILY. 12/17/22  Yes [name], MD  metoprolol succinate (TOPROL-XL) 25 MG 24 hr tablet Take 1 tablet (25 mg total) by mouth daily. 11/23/22  Yes Almon Hercules, MD  nitroGLYCERIN (NITROSTAT) 0.4 MG SL tablet Place 1 tablet (0.4 mg total) under the tongue every 5 (five) minutes x 3 doses as needed for chest pain (if no relief after 3rd dose, proceed to the ED for an evaluation or call 911). 09/05/22  Yes [name], MD  pantoprazole (PROTONIX) 40 MG tablet Take 1 tablet (40 mg total) by mouth 2 (two) times daily. 11/22/22  02/20/23 Yes Almon Hercules, MD  torsemide (DEMADEX) 20 MG tablet Take 3  tablets (60 mg total) by mouth daily. 12/12/22 12/07/23 Yes Strader, Lennart Pall, PA-C  sucralfate (CARAFATE) 1 GM/10ML suspension Take 10 mLs (1 g total) by mouth in the morning, at noon, and at bedtime. Patient not taking: Reported on 12/25/2022 11/29/22   [name], FNP     Allergies:     Allergies  Allergen Reactions   Augmentin [Amoxicillin-Pot Clavulanate] Nausea And Vomiting and Other (See Comments)    Has patient had a PCN reaction causing immediate rash, facial/tongue/throat swelling, SOB or lightheadedness with hypotension: Unknown Has patient had a PCN reaction causing severe rash involving mucus membranes or skin necrosis: No Has patient had a PCN reaction that required hospitalization: No Has patient had a PCN reaction occurring within the last 10 years: No If all of the above answers are "NO", then may proceed with Cephalosporin use.      Physical Exam:   Vitals  Blood pressure (!) 93/57, pulse (!) 112, temperature 98.5 F (36.9 C), temperature source Oral, resp. rate 19, height  (1.803 m), weight 88.3 kg, SpO2 99 %.  Physical Examination: General appearance - alert,  in no distress Mental status - alert, oriented to person, place, and time,  Eyes - sclera anicteric Neck - supple, no JVD elevation , Chest -diminished breath sounds with bibasilar rales  heart - S1 and S2 normal, irregularly irregular Abdomen - soft, nontender, nondistended, +BS Neurological - screening mental status exam normal, neck supple without rigidity, cranial nerves II through XII intact, DTR's normal and symmetric Extremities - no pedal edema noted, intact peripheral pulses  Skin - warm, dry     Data Review:    CBC Recent Labs  Lab 12/25/22 0917 12/25/22 1028  WBC 9.4  --   HGB 5.0* 5.1*  HCT 16.2* 15.0*  PLT 197  --   MCV 120.0*  --   MCH 37.0*  --   MCHC 30.9  --   RDW 26.8*  --     ------------------------------------------------------------------------------------------------------------------  Chemistries  Recent Labs  Lab 12/25/22 0919 12/25/22 0927 12/25/22 1028 12/25/22 1054  NA 130*  --  133*  --   K 4.3  --  4.4  --   CL 98  --  97*  --   CO2 20*  --   --   --   GLUCOSE 154*  --  151*  --   BUN 35*  --  35*  --   CREATININE 1.32*  --  1.50*  --   CALCIUM 8.3*  --   --   --   MG  --  2.0  --   --   AST 49*  --   --   --   ALT 43  --   --   --   ALKPHOS 245*  --   --   --   BILITOT 3.3*  --   --  3.2*   ------------------------------------------------------------------------------------------------------------------ estimated creatinine clearance is 41.1 mL/min (A) (by C-G formula based on SCr of 1.5 mg/dL (H)). ------------------------------------------------------------------------------------------------------------------ ------------------------------------------------------------------------------------------------------------------    Component Value Date/Time   BNP 658.0 (H) 12/25/2022 1016   Urinalysis    Component Value Date/Time   COLORURINE YELLOW 11/19/2022 0115   APPEARANCEUR CLEAR 11/19/2022 0115   LABSPEC 1.006 11/19/2022 0115   PHURINE 6.0 11/19/2022 0115   GLUCOSEU NEGATIVE 11/19/2022 0115   HGBUR SMALL (A) 11/19/2022 0115   BILIRUBINUR NEGATIVE 11/19/2022  0115   KETONESUR NEGATIVE 11/19/2022 0115   PROTEINUR NEGATIVE 11/19/2022 0115   UROBILINOGEN 0.2 11/28/2010 0915   NITRITE NEGATIVE 11/19/2022 0115   LEUKOCYTESUR LARGE (A) 11/19/2022 0115    Imaging Results:    US Abdomen Limited RUQ (LIVER/GB)  Result Date: 12/25/2022 CLINICAL DATA:  Elevated bilirubin. Elevated liver function studies. EXAM: ULTRASOUND ABDOMEN LIMITED RIGHT UPPER QUADRANT COMPARISON:  09/22/2022 FINDINGS: Gallbladder: Cholelithiasis with multiple stones demonstrated in the gallbladder, largest measuring about 5 mm in diameter. No gallbladder wall  thickening, edema, or sludge. Murphy's sign is negative. Common bile duct: Diameter: Common bile duct is not visualized. No intrahepatic bile ductal dilatation. Liver: No focal lesion identified. Within normal limits in parenchymal echogenicity. Portal vein is patent on color Doppler imaging with normal direction of blood flow towards the liver. Other: A moderate right pleural effusion is incidentally visualized. IMPRESSION: 1. Cholelithiasis without additional changes to suggest acute cholecystitis. 2. Right pleural effusion. 3. Similar appearance to previous study. Electronically Signed   By: Burman Nieves M.D.   On: 12/25/2022 17:17   DG Chest 2 View  Result Date: 12/25/2022 CLINICAL DATA:  Chest pain. EXAM: CHEST - 2 VIEW COMPARISON:  Chest x-ray May 18 24. FINDINGS: Enlarged cardiac silhouette. Pulmonary vascular gesture. Mild diffuse interstitial prominence. Small bilateral pleural effusions. Overlying bibasilar opacities. No visible pneumothorax. Polyarticular degenerative change. CABG and median sternotomy. IMPRESSION: Cardiomegaly, pulmonary vascular congestion, bilateral pleural effusions and mild interstitial prominence, suggestive of pulmonary edema. Superimposed pneumonia at the lung bases is not excluded. Electronically Signed   By: Feliberto Harts M.D.   On: 12/25/2022 09:48    Radiological Exams on Admission: US Abdomen Limited RUQ (LIVER/GB)  Result Date: 12/25/2022 CLINICAL DATA:  Elevated bilirubin. Elevated liver function studies. EXAM: ULTRASOUND ABDOMEN LIMITED RIGHT UPPER QUADRANT COMPARISON:  09/22/2022 FINDINGS: Gallbladder: Cholelithiasis with multiple stones demonstrated in the gallbladder, largest measuring about 5 mm in diameter. No gallbladder wall thickening, edema, or sludge. Murphy's sign is negative. Common bile duct: Diameter: Common bile duct is not visualized. No intrahepatic bile ductal dilatation. Liver: No focal lesion identified. Within normal limits in  parenchymal echogenicity. Portal vein is patent on color Doppler imaging with normal direction of blood flow towards the liver. Other: A moderate right pleural effusion is incidentally visualized. IMPRESSION: 1. Cholelithiasis without additional changes to suggest acute cholecystitis. 2. Right pleural effusion. 3. Similar appearance to previous study. Electronically Signed   By: Burman Nieves M.D.   On: 12/25/2022 17:17   DG Chest 2 View  Result Date: 12/25/2022 CLINICAL DATA:  Chest pain. EXAM: CHEST - 2 VIEW COMPARISON:  Chest x-ray May 18 24. FINDINGS: Enlarged cardiac silhouette. Pulmonary vascular gesture. Mild diffuse interstitial prominence. Small bilateral pleural effusions. Overlying bibasilar opacities. No visible pneumothorax. Polyarticular degenerative change. CABG and median sternotomy. IMPRESSION: Cardiomegaly, pulmonary vascular congestion, bilateral pleural effusions and mild interstitial prominence, suggestive of pulmonary edema. Superimposed pneumonia at the lung bases is not excluded. Electronically Signed   By: Feliberto Harts M.D.   On: 12/25/2022 09:48    DVT Prophylaxis -SCD  AM Labs Ordered, also please review Full Orders  Family Communication: Admission, patients condition and plan of care including tests being ordered have been discussed with the patient  who indicate understanding and agree with the plan   Condition   -stable  [name] M.D on 12/25/2022 at 8:22 PM Go to [email or web address] -  for contact info  [facility] - Office  [phone]

## 2022-12-25 NOTE — ED Triage Notes (Signed)
Pt c/o pain to left chest with no radiation since 3 am. A/o color appears jaundiced. Non diaphoretic. Pt c/o sob, no obvious sob noted in triage. Took 3 ntg with no relief.tachy in triage. Had had approx lost 8lbs in last month

## 2022-12-25 NOTE — Consult Note (Signed)
CARDIOLOGY CONSULT NOTE    Patient ID: [name]; [id number]; [birth year]   Admit date: 12/25/2022 Date of Consult: 12/25/2022  Primary Care Provider: [name], FNP Primary Cardiologist:  Primary Electrophysiologist:    Patient Profile:   Leonard Cisneros is a 82 y.o. male with a hx of CAD s/p CABG in 2003, s/p BMS to SVG to RI in 2017, permanent A-fib, mild to moderate aortic valve stenosis in 09/2022, HTN, HLD who is being seen today for the evaluation of  at the request of .  History of Present Illness:   Leonard Cisneros is a 82 year old M known to have CAD s/p CABG in 2003, s/p BMS to SVG to RI in 2017, permanent A-fib, mild to moderate aortic valve stenosis in 09/2022, HTN, HLD presented to ER with chest pain and black stool.  Patient was hospitalized in 11/2022 at [facility] for acute blood loss anemia. EGD showed gastritis, nonbleeding duodenal ulcer with clean ulcer base and mucosal nodule in the duodenum. GI recommended to continue IV Protonix during the hospitalization and p.o. twice daily for 3 months upon discharge, to hold Eliquis for 3 days after EGD.  Patient reported having black stools for the last 1 month but in the last 1 week he noticed his stool change color to brown.  But this morning, he noticed black stool again.  He also started to have chest pain for the last 3 to 4 days, occurring every day, lasting for a few minutes. Last night he had severe chest pains which were not relieved by sublingual nitroglycerin tablets. Still he went to sleep and woke up at 6:30 AM today. He noticed recurrence of black stool today.  Has some SOB.  Denies nausea, palpitations, dizziness or presyncope.  Past Medical History:  Diagnosis Date   Aortic stenosis    Atrial flutter    a. s/p remote ablation by Dr. Ladona Ridgel.   CAD (coronary artery disease)    a. s/p CABG 2003. b. 01/2016: unstable angina - s/p BMS to SVG-ramus intermediate, patent LIMA-mLAD, patent RIMA-dRCA.     Chronic lower back pain    Essential hypertension    Hyperlipidemia    Ischemic cardiomyopathy    a. Cath 01/2016 -  EF 50-55% with mild distal inferior hypocontractility.   Peripheral neuropathy 04/04/2014   Permanent atrial fibrillation     Past Surgical History:  Procedure Laterality Date   BACK SURGERY     BIOPSY  11/21/2022   Procedure: BIOPSY;  Surgeon: Napoleon Form, MD;  Location: [facility];  Service: Gastroenterology;;   CARDIAC CATHETERIZATION  ~ 2000; 2003   CARDIAC CATHETERIZATION N/A 02/01/2016   Procedure: Left Heart Cath and Cors/Grafts Angiography;  Surgeon: Lennette Bihari, MD;  Location: [facility];  Service: Cardiovascular;  Laterality: N/A;   CARDIAC CATHETERIZATION N/A 02/01/2016   Procedure: Coronary Stent Intervention;  Surgeon: Lennette Bihari, MD;  Location: [facility];  Service: Cardiovascular;  Laterality: N/A;   CATARACT EXTRACTION W/PHACO Left 02/09/2019   Procedure: CATARACT EXTRACTION PHACO AND INTRAOCULAR LENS PLACEMENT (IOC);  Surgeon: Fabio Pierce, MD;  Location: [facility];  Service: Ophthalmology;  Laterality: Left;  CDE: 27.70   CATARACT EXTRACTION W/PHACO Right 02/23/2019   Procedure: CATARACT EXTRACTION PHACO AND INTRAOCULAR LENS PLACEMENT RIGHT EYE;  Surgeon: Fabio Pierce, MD;  Location: [facility];  Service: Ophthalmology;  Laterality: Right;  CDE: 10.15   COLONOSCOPY  06/04/2002   Normal colon and rectum  COLONOSCOPY  07/08/2012   Procedure: COLONOSCOPY;  Surgeon: Corbin Ade, MD;  Location: [facility];  Service: Endoscopy;  Laterality: N/A;  11:30   COLONOSCOPY N/A 10/02/2017   Procedure: COLONOSCOPY;  Surgeon: Corbin Ade, MD;  Location: [facility];  Service: Endoscopy;  Laterality: N/A;  10:30am   CORONARY ANGIOPLASTY WITH STENT PLACEMENT  02/01/2016   CORONARY ARTERY BYPASS GRAFT  2003   LIMA to LAD,LIMA to distal RCA,SVG to ramus intermediate vessel.   ESOPHAGOGASTRODUODENOSCOPY (EGD) WITH PROPOFOL N/A 11/21/2022    Procedure: ESOPHAGOGASTRODUODENOSCOPY (EGD) WITH PROPOFOL;  Surgeon: Napoleon Form, MD;  Location: [facility];  Service: Gastroenterology;  Laterality: N/A;   FEMORAL REVISION Right 03/30/2019   Procedure: Femoral revision right total hip arthroplasty;  Surgeon: Ollen Gross, MD;  Location: [facility];  Service: Orthopedics;  Laterality: Right;    FOREIGN BODY REMOVAL Right 04/02/2014   Procedure: FOREIGN BODY REMOVAL RIGHT FOOT;  Surgeon: Dalia Heading, MD;  Location: [facility];  Service: General;  Laterality: Right;   JOINT REPLACEMENT     JOINT REPLACEMENT     LUMBAR DISC SURGERY     REVISION TOTAL HIP ARTHROPLASTY Bilateral 2005-2012   right-left   RIGHT/LEFT HEART CATH AND CORONARY/GRAFT ANGIOGRAPHY N/A 09/27/2022   Procedure: RIGHT/LEFT HEART CATH AND CORONARY/GRAFT ANGIOGRAPHY;  Surgeon: Swazil[place], Peter M, MD;  Location: [facility];  Service: Cardiovascular;  Laterality: N/A;   SHOULDER OPEN ROTATOR CUFF REPAIR Right    TOTAL HIP ARTHROPLASTY Right 1999   TOTAL HIP ARTHROPLASTY Left 2008   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     Inpatient Medications: Scheduled Meds:  atorvastatin  80 mg Oral Daily   isosorbide mononitrate  30 mg Oral Once   [START ON 12/26/2022] metoprolol succinate  25 mg Oral Daily   pantoprazole (PROTONIX) IV  40 mg Intravenous Q12H   sodium chloride flush  3 mL Intravenous Q12H   sodium chloride flush  3 mL Intravenous Q12H   sucralfate  1 g Oral TID WC & HS   Continuous Infusions:  sodium chloride     PRN Meds: sodium chloride, acetaminophen **OR** acetaminophen, bisacodyl, ondansetron **OR** ondansetron (ZOFRAN) IV, polyethylene glycol, sodium chloride flush, traZODone  Allergies:    Allergies  Allergen Reactions   Augmentin [Amoxicillin-Pot  Clavulanate] Nausea And Vomiting and Other (See Comments)    Has patient had a PCN reaction causing immediate rash, facial/tongue/throat swelling, SOB or lightheadedness with hypotension: Unknown Has patient had a PCN reaction causing severe rash involving mucus membranes or skin necrosis: No Has patient had a PCN reaction that required hospitalization: No Has patient had a PCN reaction occurring within the last 10 years: No If all of the above answers are "NO", then may proceed with Cephalosporin use.     Social History:   Social History   Socioeconomic History   Marital status: Married    Spouse name: Larita Fife   Number of children: 2   Years of education: Not on file   Highest education level: Bachelor's degree (e.g., BA, AB, BS)  Occupational History   Occupation: PT Holiday representative: RETIRED  Tobacco Use   Smoking status: Former    Packs/day: 0.50    Years: 2.00    Additional pack years: 0.00    Total pack  years: 1.00    Types: Cigarettes    Quit date: 09/03/1966    Years since quitting: 56.3   Smokeless tobacco: Never  Vaping Use   Vaping Use: Never used  Substance and Sexual Activity   Alcohol use: Yes    Alcohol/week: 11.0 standard drinks of alcohol    Types: 7 Glasses of wine, 4 Cans of beer per week    Comment: a glass of wine with dinner/ beer on the weekend   Drug use: No   Sexual activity: Not on file  Other Topics Concern   Not on file  Social History Narrative   Lives with his wife.   Social Determinants of Health   Financial Resource Strain: Low Risk  (11/25/2022)   Overall Financial Resource Strain (CARDIA)    Difficulty of Paying Living Expenses: Not hard at all  Food Insecurity: No Food Insecurity (11/28/2022)   Hunger Vital Sign    Worried About Running Out of Food in the Last Year: Never true    Ran Out of Food in the Last Year: Never true  Transportation Needs: No Transportation Needs (11/28/2022)   PRAPARE - Therapist, art (Medical): No    Lack of Transportation (Non-Medical): No  Physical Activity: Unknown (11/25/2022)   Exercise Vital Sign    Days of Exercise per Week: 0 days    Minutes of Exercise per Session: Not on file  Stress: No Stress Concern Present (11/25/2022)   [employer] of Occupational Health - Occupational Stress Questionnaire    Feeling of Stress : Not at all  Social Connections: Moderately Integrated (11/25/2022)   Social Connection and Isolation Panel [NHANES]    Frequency of Communication with Friends and Family: Once a week    Frequency of Social Gatherings with Friends and Family: Once a week    Attends Religious Services: More than 4 times per year    Active Member of [employer] or Organizations: Yes    Attends Engineer, structural: More than 4 times per year    Marital Status: Married  Catering manager Violence: Not At Risk (09/24/2022)   Humiliation, Afraid, Rape, and Kick questionnaire    Fear of Current or Ex-Partner: No    Emotionally Abused: No    Physically Abused: No    Sexually Abused: No    Family History:    Family History  Problem Relation Age of Onset   Heart attack Brother    Colon cancer Neg Hx      ROS:  Please see the history of present illness.  ROS  All other ROS reviewed and negative.     Physical Exam/Data:   Vitals:   12/25/22 1330 12/25/22 1400 12/25/22 1500 12/25/22 1530  BP: 97/60 (!) 84/50 (!) 83/50 96/60  Pulse: (!) 111 (!) 104 (!) 46   Resp: 20 17 18 18   Temp:      TempSrc:      SpO2: 99% 95%     No intake or output data in the 24 hours ending 12/25/22 1621 There were no vitals filed for this visit. There is no height or weight on file to calculate BMI.  General:  Well nourished, well developed, in no acute distress HEENT: normal Lymph: no adenopathy Neck: no JVD Endocrine:  No thryomegaly Vascular: No carotid bruits; FA pulses 2+ bilaterally without bruits  Cardiac:  normal S1, S2; RRR; no murmur   Lungs:  clear to auscultation bilaterally, no wheezing, rhonchi or rales  Abd: soft, nontender, no hepatomegaly  Ext: no edema Musculoskeletal:  No deformities, BUE and BLE strength normal and equal Skin: warm and dry  Neuro:  CNs 2-12 intact, no focal abnormalities noted Psych:  Normal affect   EKG:  The EKG was personally reviewed and demonstrates:   Telemetry:  Telemetry was personally reviewed and demonstrates:    Relevant CV Studies   Laboratory Data:  Chemistry Recent Labs  Lab 12/25/22 0919 12/25/22 1028  NA 130* 133*  K 4.3 4.4  CL 98 97*  CO2 20*  --   GLUCOSE 154* 151*  BUN 35* 35*  CREATININE 1.32* 1.50*  CALCIUM 8.3*  --   GFRNONAA 54*  --   ANIONGAP 12  --     Recent Labs  Lab 12/25/22 0919  PROT 5.9*  ALBUMIN 3.1*  AST 49*  ALT 43  ALKPHOS 245*  BILITOT 3.3*   Hematology Recent Labs  Lab 12/25/22 0917 12/25/22 1028  WBC 9.4  --   RBC 1.35*  --   HGB 5.0* 5.1*  HCT 16.2* 15.0*  MCV 120.0*  --   MCH 37.0*  --   MCHC 30.9  --   RDW 26.8*  --   PLT 197  --    Cardiac EnzymesNo results for input(s): "TROPONINI" in the last 168 hours. No results for input(s): "TROPIPOC" in the last 168 hours.  BNP Recent Labs  Lab 12/25/22 1016  BNP 658.0*    DDimer No results for input(s): "DDIMER" in the last 168 hours.  Radiology/Studies:  DG Chest 2 View  Result Date: 12/25/2022 CLINICAL DATA:  Chest pain. EXAM: CHEST - 2 VIEW COMPARISON:  Chest x-ray May 18 24. FINDINGS: Enlarged cardiac silhouette. Pulmonary vascular gesture. Mild diffuse interstitial prominence. Small bilateral pleural effusions. Overlying bibasilar opacities. No visible pneumothorax. Polyarticular degenerative change. CABG and median sternotomy. IMPRESSION: Cardiomegaly, pulmonary vascular congestion, bilateral pleural effusions and mild interstitial prominence, suggestive of pulmonary edema. Superimposed pneumonia at the lung bases is not excluded. Electronically Signed   By:  Feliberto Harts M.D.   On: 12/25/2022 09:48    Assessment and Plan:   Patient is 82 year old M known to have CAD s/p CABG in 2003, s/p BMS to SVG to RI in 2017, permanent A-fib, mild to moderate aortic valve stenosis in 09/2022, HTN, HLD presented to ER with chest pain and black stool.  # Acute blood loss anemia secondary to upper GI bleeding from likely duodenal ulcer: Hb 5.1 today. Keep hemoglobin more than 8 due to CAD s/p CABG history. Hold off on all anticoagulants and antiplatelets.  Follow-up with GI recommendations.  # CAD s/p CABG in 2003, s/p BMS to SVG-RI in 2017 with normal LVEF, currently has stable angina -Continue home medications, Imdur 30 mg once daily and metoprolol succinate 25 mg once daily. -Continue atorvastatin 80 mg nightly  # Acute myocardial injury with no evidence of myocardial infarction -EKG showed atrial fibrillation with RVR, HR 108 bpm, ST changes in the lateral leads, V5 V6. Hs troponins 220 x 1.  This likely secondary to demand ischemia from acute blood loss anemia.  No indication of ACS protocol.  # Permanent A-fib with RVR likely secondary to acute blood loss anemia -Treat underlying etiology, keep hemoglobin more than 8.  Continue metoprolol succinate 25 mg once daily. -Hold Eliquis. Outpatient EP evaluation for watchman candidacy.  # Lactic acidosis secondary to acute blood loss anemia: Management per primary team, keep hemoglobin more than 8  I have spent  a total of 70 minutes with patient reviewing chart , telemetry, EKGs, labs and examining patient as well as establishing an assessment and plan that was discussed with the patient.  > 50% of time was spent in direct patient care.      For questions or updates, please contact [facility] Please consult [email or web address] for contact info under Cardiology/STEMI.   Signed, Herbert Deaner, MD 12/25/2022 4:21 PM

## 2022-12-25 NOTE — ED Notes (Signed)
Received report from [facility]. Pt resting. A/o. Wife at bedside. Denies cp at this time. Occult blood positive. Pt has purple bruise noted to right hip. Some slight discoloration noted to bottom but no break down.

## 2022-12-25 NOTE — ED Provider Notes (Signed)
[place] [facility]ERGENCY DEPARTMENT AT [facility] Provider Note   CSN: [id number] Arrival date & time: 12/25/22  0900     History  Chief Complaint  Patient presents with   Chest Pain    Leonard Cisneros is a 82 y.o. male with history of CAD s/p CABG 2003, HTN, chronic back pain, ischemic cardiomyopathy s/p cath 2017 (EF 50-55%), permanent atrial fibrillation on eliquis, peripheral neuropathy, aortic stenosis, HLD who presents to the emergency department complaining of chest pain since 3 am. Localizes to the left side of the chest. Took 3 nitroglycerin this morning without relief, but was able to fall back asleep. Woke up later around 630am and told his wife. He felt a bit nauseated, wife states he has been gagging. Has been feeling more off balance the past few days, often times having to use a walker when he doesn't always require it at baseline. Usually uses two canes to ambulate.   Has not had any recent episodes of melena since hospital discharge last month. Reports good compliance with medications. Recently had torsemide dosage decreased at cardiologist. Does not necessarily feel fluid overloaded today; however does complain of increased shortness of breath when laying flat.    Chest Pain Associated symptoms: nausea, shortness of breath and weakness   Associated symptoms: no cough and no fever        Home Medications Prior to Admission medications   Medication Sig Start Date End Date Taking? Authorizing Provider  apixaban (ELIQUIS) 5 MG TABS tablet TAKE 1 TABLET BY MOUTH TWICE A DAY 12/20/22  Yes [name], MD  atorvastatin (LIPITOR) 80 MG tablet TAKE 1 TABLET BY MOUTH EVERY DAY Patient taking differently: Take 80 mg by mouth daily. 11/30/22  Yes [name], MD  Iron, Ferrous Sulfate, 325 (65 Fe) MG TABS Take 325 mg by mouth daily. 12/02/22  Yes [name], FNP  isosorbide mononitrate (IMDUR) 30 MG 24 hr tablet Take 30 mg by mouth daily. 12/02/22  Yes  [provider]  KLOR-CON M10 10 MEQ tablet TAKE 3 TABLETS BY MOUTH DAILY. 12/17/22  Yes [name], MD  metoprolol succinate (TOPROL-XL) 25 MG 24 hr tablet Take 1 tablet (25 mg total) by mouth daily. 11/23/22  Yes Almon Hercules, MD  nitroGLYCERIN (NITROSTAT) 0.4 MG SL tablet Place 1 tablet (0.4 mg total) under the tongue every 5 (five) minutes x 3 doses as needed for chest pain (if no relief after 3rd dose, proceed to the ED for an evaluation or call 911). 09/05/22  Yes [name], MD  pantoprazole (PROTONIX) 40 MG tablet Take 1 tablet (40 mg total) by mouth 2 (two) times daily. 11/22/22 02/20/23 Yes Almon Hercules, MD  torsemide (DEMADEX) 20 MG tablet Take 3 tablets (60 mg total) by mouth daily. 12/12/22 12/07/23 Yes Strader, Lennart Pall, PA-C  sucralfate (CARAFATE) 1 GM/10ML suspension Take 10 mLs (1 g total) by mouth in the morning, at noon, and at bedtime. Patient not taking: Reported on 12/25/2022 11/29/22   [name], FNP      Allergies    Augmentin [amoxicillin-pot clavulanate]    Review of Systems   Review of Systems  Constitutional:  Negative for fever.  Respiratory:  Positive for shortness of breath. Negative for cough.   Cardiovascular:  Positive for chest pain.  Gastrointestinal:  Positive for nausea.  Neurological:  Positive for weakness.  All other systems reviewed and are negative.   Physical Exam Updated Vital Signs BP [name])  84/50   Pulse (!) 104   Temp (!) 96.6 F (35.9 C) (Rectal)   Resp 17   SpO2 95%  Physical Exam Vitals and nursing note reviewed.  Constitutional:      Appearance: Normal appearance.  HENT:     Head: Normocephalic and atraumatic.  Eyes:     Conjunctiva/sclera: Conjunctivae normal.  Cardiovascular:     Rate and Rhythm: Tachycardia present. Rhythm irregular.     Heart sounds: Murmur heard.     Decrescendo systolic murmur is present.     Comments: Trace pitting edema bilaterally Pulmonary:     Effort: Pulmonary effort is  normal. No respiratory distress.     Breath sounds: Normal breath sounds.  Abdominal:     General: There is no distension.     Palpations: Abdomen is soft.     Tenderness: There is no abdominal tenderness.  Skin:    General: Skin is warm and dry.     Comments: Chronic darkening and thickening of skin of the bilateral lower extremities  Neurological:     General: No focal deficit present.     Mental Status: He is alert.     ED Results / Procedures / Treatments   Labs (all labs ordered are listed, but only abnormal results are displayed) Labs Reviewed  CBC - Abnormal; Notable for the following components:      Result Value   RBC 1.35 (*)    Hemoglobin 5.0 (*)    HCT 16.2 (*)    MCV 120.0 (*)    MCH 37.0 (*)    RDW 26.8 (*)    nRBC 1.3 (*)    All other components within normal limits  COMPREHENSIVE METABOLIC PANEL - Abnormal; Notable for the following components:   Sodium 130 (*)    CO2 20 (*)    Glucose, Bld 154 (*)    BUN 35 (*)    Creatinine, Ser 1.32 (*)    Calcium 8.3 (*)    Total Protein 5.9 (*)    Albumin 3.1 (*)    AST 49 (*)    Alkaline Phosphatase 245 (*)    Total Bilirubin 3.3 (*)    GFR, Estimated 54 (*)    All other components within normal limits  BRAIN NATRIURETIC PEPTIDE - Abnormal; Notable for the following components:   B Natriuretic Peptide 658.0 (*)    All other components within normal limits  LACTIC ACID, PLASMA - Abnormal; Notable for the following components:   Lactic Acid, Venous 3.5 (*)    All other components within normal limits  I-STAT CHEM 8, ED - Abnormal; Notable for the following components:   Sodium 133 (*)    Chloride 97 (*)    BUN 35 (*)    Creatinine, Ser 1.50 (*)    Glucose, Bld 151 (*)    Calcium, Ion 1.12 (*)    Hemoglobin 5.1 (*)    HCT 15.0 (*)    All other components within normal limits  POC OCCULT BLOOD, ED - Abnormal; Notable for the following components:   Fecal Occult Bld POSITIVE (*)    All other components  within normal limits  TROPONIN I (HIGH SENSITIVITY) - Abnormal; Notable for the following components:   Troponin I (High Sensitivity) 216 (*)    All other components within normal limits  TROPONIN I (HIGH SENSITIVITY) - Abnormal; Notable for the following components:   Troponin I (High Sensitivity) 220 (*)    All other components within normal limits  MAGNESIUM  LACTIC ACID, PLASMA  TYPE AND SCREEN  PREPARE RBC (CROSSMATCH)  TYPE AND SCREEN    EKG None  Radiology DG Chest 2 View  Result Date: 12/25/2022 CLINICAL DATA:  Chest pain. EXAM: CHEST - 2 VIEW COMPARISON:  Chest x-ray May 18 24. FINDINGS: Enlarged cardiac silhouette. Pulmonary vascular gesture. Mild diffuse interstitial prominence. Small bilateral pleural effusions. Overlying bibasilar opacities. No visible pneumothorax. Polyarticular degenerative change. CABG and median sternotomy. IMPRESSION: Cardiomegaly, pulmonary vascular congestion, bilateral pleural effusions and mild interstitial prominence, suggestive of pulmonary edema. Superimposed pneumonia at the lung bases is not excluded. Electronically Signed   By: Feliberto Harts M.D.   On: 12/25/2022 09:48    Procedures .Critical Care  Performed by: Su Monks, PA-C Authorized by: Su Monks, PA-C   Critical care provider statement:    Critical care time (minutes):  30   Critical care was necessary to treat or prevent imminent or life-threatening deterioration of the following conditions:  Circulatory failure   Critical care was time spent personally by me on the following activities:  Development of treatment plan with patient or surrogate, discussions with consultants, evaluation of patient's response to treatment, examination of patient, ordering and review of laboratory studies, ordering and review of radiographic studies, ordering and performing treatments and interventions, pulse oximetry, re-evaluation of patient's condition and review of old charts    Care discussed with: admitting provider   Comments:     Severe anemia with hgb of 5 requiring blood transfusion     Medications Ordered in ED Medications  cefTRIAXone (ROCEPHIN) 1 g in sodium chloride 0.9 % 100 mL IVPB (1 g Intravenous New Bag/Given 12/25/22 1403)  isosorbide mononitrate (IMDUR) 24 hr tablet 30 mg (has no administration in time range)  pantoprazole (PROTONIX) injection 40 mg (has no administration in time range)  atorvastatin (LIPITOR) tablet 80 mg (has no administration in time range)  metoprolol succinate (TOPROL-XL) 24 hr tablet 25 mg (has no administration in time range)  sucralfate (CARAFATE) 1 GM/10ML suspension 1 g (has no administration in time range)  sodium chloride flush (NS) 0.9 % injection 3 mL (has no administration in time range)  sodium chloride flush (NS) 0.9 % injection 3 mL (has no administration in time range)  sodium chloride flush (NS) 0.9 % injection 3 mL (has no administration in time range)  0.9 %  sodium chloride infusion (has no administration in time range)  acetaminophen (TYLENOL) tablet 650 mg (has no administration in time range)    Or  acetaminophen (TYLENOL) suppository 650 mg (has no administration in time range)  traZODone (DESYREL) tablet 50 mg (has no administration in time range)  polyethylene glycol (MIRALAX / GLYCOLAX) packet 17 g (has no administration in time range)  bisacodyl (DULCOLAX) suppository 10 mg (has no administration in time range)  ondansetron (ZOFRAN) tablet 4 mg (has no administration in time range)    Or  ondansetron (ZOFRAN) injection 4 mg (has no administration in time range)  pantoprazole (PROTONIX) injection 40 mg (40 mg Intravenous Given 12/25/22 1129)  0.9 %  sodium chloride infusion (Manually program via Guardrails IV Fluids) ( Intravenous New Bag/Given 12/25/22 1406)  sodium chloride 0.9 % bolus 500 mL (0 mLs Intravenous Stopped 12/25/22 1357)    ED Course/ Medical Decision Making/ A&P                              Medical Decision Making Amount and/or  Complexity of Data Reviewed Labs: ordered. Radiology: ordered.   This patient is a 82 y.o. male  who presents to the ED for concern of chest pain.   Differential diagnoses prior to evaluation: The emergent differential diagnosis includes, but is not limited to,  ACS, pericarditis, myocarditis, aortic dissection, PE, pneumothorax, esophageal spasm or rupture, chronic angina, pneumonia, bronchitis, GERD, reflux/PUD, biliary disease, pancreatitis, costochondritis, anxiety. This is not an exhaustive differential.   Past Medical History / Co-morbidities: CAD s/p CABG 2003, HTN, chronic back pain, ischemic cardiomyopathy s/p cath 2017 (EF 50-55%), permanent atrial fibrillation on eliquis, peripheral neuropathy, aortic stenosis, HLD  Additional history: Chart reviewed. Pertinent results include: Most recent catheterization in Jan 2024 with significant disease, primary cardiologist Dr Diona Browner.   Physical Exam: Physical exam performed. The pertinent findings include: Tachycardic in the low 100's, normal respiratory effort.   Lab Tests/Imaging studies: I personally interpreted labs/imaging and the pertinent results include: No leukocytosis, hemoglobin 5.0 (compared to 8.9 three weeks ago), sodium 130, BUN 35, creatinine 1.32, GFR 54.  Liver enzymes elevated, but slightly improved compared to prior, albumin 3.1, AST 49, alkaline phosphatase 245, total bili 3.3.  BNP 658, stable compared to prior.  Initial troponin 216, delta troponin to 20, likely demand.  Lactic acid 3.5.  Positive Hemoccult.  CXR shows cardiomegaly, bilateral pleural effusions, interstitial prominence suggestive of pulmonary edema, cannot rule out underlying infection. I agree with the radiologist interpretation.  Cardiac monitoring: EKG obtained and interpreted by my attending physician which shows: atrial fibrillation with rapid ventricular rate in the low 100's, no significant  change compared to prior.    Medications: I ordered medication including gentle IVF, blood transfusion, rocephin for possible concomitant pneumonia on CXR.  I have reviewed the patients home medicines and have made adjustments as needed.  Consultations obtained: I consulted with gastroenterologist Dr Levon Hedger who will plan for scope.  I consulted with cardiologist Dr Jenene Slicker who recommended holding heparin in the setting of GI bleed, start imdur for chest pain.  I consulted with hospitalist Dr Mariea Clonts who will admit.    Disposition: After consideration of the diagnostic results and the patients response to treatment, I feel that patient would benefit from medical admission for acute anemia in the setting of GI bleed, fluid overload with acute kidney injury and possible CHF exacerbation, demand ischemia.   I discussed this case with my attending physician Dr. Durwin Nora who cosigned this note including patient's presenting symptoms, physical exam, and planned diagnostics and interventions. Attending physician stated agreement with plan or made changes to plan which were implemented.   Final Clinical Impression(s) / ED Diagnoses Final diagnoses:  Chest pain, unspecified type  Gastrointestinal hemorrhage, unspecified gastrointestinal hemorrhage type  Severe anemia  Acute kidney injury    Rx / DC Orders ED Discharge Orders     None      Portions of this report may have been transcribed using voice recognition software. Every effort was made to ensure accuracy; however, inadvertent computerized transcription errors may be present.    Jeanella Flattery 12/25/22 1409    Gloris Manchester, MD 12/25/22 [phone]

## 2022-12-25 NOTE — Progress Notes (Signed)
UNMATCHED BLOOD PRODUCT NOTE  Compare the patient ID on the blood tag to the patient ID on the hospital armband and Blood Bank armband. Then confirm the unit number on the blood tag matches the unit number on the blood product.  If a discrepancy is discovered return the product to blood bank immediately.   Blood Product Type: Packed Red Blood Cells  Unit #: (Found on blood product bag, begins with W) [id number]  Product Code #: (Found on blood product bag, begins with E) [id number]   Start Time: 1943  Starting Rate: 120 ml/hr  Rate increase/decreased  (if applicable):      ml/hr  Rate changed time (if applicable):    Stop Time: 2234   All Other Documentation should be documented within the Blood Admin Flowsheet per policy.

## 2022-12-25 NOTE — H&P (View-Only) (Signed)
@LOGO@   Referring Provider: Roemhildt, Lorin T, PA-C  Primary Care Physician:  Zarwolo, Gloria, FNP Primary Gastroenterologist:  Dr. Rourk  Date of Admission: 12/25/22 Date of Consultation: 12/26/22  Reason for Consultation:  Anemia, history of duodenal ulcer  HPI:  Leonard Cisneros is a 81 y.o. year old male with history of CAD s/p CABG, ischemic cardiomyopathy, atrial fibrillation on Eliquis, HTN, HLD, moderate to severe aortic stenosis, duodenal ulcer noted on EGD in March 2024, who presented to the emergency room this morning due to left chest pain.  ED course: Hypotensive with BP as low as 83/50, intermittent tachycardia, up to 118, otherwise vital signs stable.  Labs remarkable for hemoglobin 5.0 (down from 8.9, 3 weeks ago), fecal occult blood positive. Sodium 130, creatinine 1.32, AST 49, alk phos 245, total bilirubin 3.3. BNP 658 Lactic acid 3.5 Troponin 216>> 220  Chest x-ray with cardiomegaly, pulmonary vascular congestion, bilateral pleural effusions, mild interstitial prominence suggestive of pulmonary edema, superimposed pneumonia at lung bases not excluded.  ED provider consulted with cardiology who recommended holding heparin in setting of GI bleed and starting Imdur for chest pain.  GI consulted for bleeding, hospitalist consulted for admission.   Today:  Woke up with chest pain early this morning. Took 3 nitro without improvement and came to the ER. Before this morning, would have intermittent pain related to activity. Would sit down and symptoms would disappear. Lack of energy for the last week or so. Associated SOB.   Weight loss since admitted in March with CHF exacerbation. Started on Lasix at that time due to volume overload. Was taking Lasix 4 times a day. Decreased to 3 times a day 2 weeks ago. Has continues to lose weight.   Has not had an appetite for the last several months. Intermittent gagging with fluid coming up in his mouth. No heartburn. Some upper  abdominal pain today described as a dull ache, but not at home. No postprandial symptoms.   No brbpr. Stools black the past couple of days. Last episode this morning.   Occasional Tylenol. No NSAIDs.   Taking PPI BID. Has been taking carafate but stopped after his last OV.  Last dose of Eliquis was this morning.  Taking iron daily.    EGD 11/21/2022 at [facility]: -Z-line regular -Normal esophagus -Gastritis s/p biopsy -Nonbleeding duodenal ulcer with clean ulcer base -Mucosal nodule in the duodenum s/p biopsy -Duodenal nodule with polypoid fragment of benign small bowel mucosa with focal foveolar metaplasia suggestive of peptic injury -Gastric biopsies negative for H. pylori or intestinal metaplasia. -Advised to avoid NSAIDs, resume Eliquis in 3 days and use PPI twice daily for 3 months.   Colonoscopy in January 2019: -entire colon normal -no repeat colonoscopy due to age  Past Medical History:  Diagnosis Date   Aortic stenosis    Atrial flutter    a. s/p remote ablation by Dr. Taylor.   CAD (coronary artery disease)    a. s/p CABG 2003. b. 01/2016: unstable angina - s/p BMS to SVG-ramus intermediate, patent LIMA-mLAD, patent RIMA-dRCA.    Chronic lower back pain    Essential hypertension    Hyperlipidemia    Ischemic cardiomyopathy    a. Cath 01/2016 -  EF 50-55% with mild distal inferior hypocontractility.   Peripheral neuropathy 04/04/2014   Permanent atrial fibrillation     Past Surgical History:  Procedure Laterality Date   BACK SURGERY     BIOPSY  11/21/2022   Procedure: BIOPSY;  Surgeon:   Nandigam, Kavitha V, MD;  Location: [facility];  Service: Gastroenterology;;   CARDIAC CATHETERIZATION  ~ 2000; 2003   CARDIAC CATHETERIZATION N/A 02/01/2016   Procedure: Left Heart Cath and Cors/Grafts Angiography;  Surgeon: Thomas A Kelly, MD;  Location: [facility];  Service: Cardiovascular;  Laterality: N/A;   CARDIAC CATHETERIZATION N/A 02/01/2016   Procedure:  Coronary Stent Intervention;  Surgeon: Thomas A Kelly, MD;  Location: [facility];  Service: Cardiovascular;  Laterality: N/A;   CATARACT EXTRACTION W/PHACO Left 02/09/2019   Procedure: CATARACT EXTRACTION PHACO AND INTRAOCULAR LENS PLACEMENT (IOC);  Surgeon: Wrzosek, James, MD;  Location: [facility];  Service: Ophthalmology;  Laterality: Left;  CDE: 27.70   CATARACT EXTRACTION W/PHACO Right 02/23/2019   Procedure: CATARACT EXTRACTION PHACO AND INTRAOCULAR LENS PLACEMENT RIGHT EYE;  Surgeon: Wrzosek, James, MD;  Location: [facility];  Service: Ophthalmology;  Laterality: Right;  CDE: 10.15   COLONOSCOPY  06/04/2002   Normal colon and rectum   COLONOSCOPY  07/08/2012   Procedure: COLONOSCOPY;  Surgeon: Robert M Rourk, MD;  Location: [facility];  Service: Endoscopy;  Laterality: N/A;  11:30   COLONOSCOPY N/A 10/02/2017   Procedure: COLONOSCOPY;  Surgeon: Rourk, Robert M, MD;  Location: [facility];  Service: Endoscopy;  Laterality: N/A;  10:30am   CORONARY ANGIOPLASTY WITH STENT PLACEMENT  02/01/2016   CORONARY ARTERY BYPASS GRAFT  2003   LIMA to LAD,LIMA to distal RCA,SVG to ramus intermediate vessel.   ESOPHAGOGASTRODUODENOSCOPY (EGD) WITH PROPOFOL N/A 11/21/2022   Procedure: ESOPHAGOGASTRODUODENOSCOPY (EGD) WITH PROPOFOL;  Surgeon: Nandigam, Kavitha V, MD;  Location: [facility];  Service: Gastroenterology;  Laterality: N/A;   FEMORAL REVISION Right 03/30/2019   Procedure: Femoral revision right total hip arthroplasty;  Surgeon: Aluisio, Frank, MD;  Location: [facility];  Service: Orthopedics;  Laterality: Right;  120min   FOREIGN BODY REMOVAL Right 04/02/2014   Procedure: FOREIGN BODY REMOVAL RIGHT FOOT;  Surgeon: Mark A Jenkins, MD;  Location: [facility];  Service: General;  Laterality: Right;   JOINT REPLACEMENT     JOINT REPLACEMENT     LUMBAR DISC SURGERY     REVISION TOTAL HIP ARTHROPLASTY Bilateral 2005-2012   right-left   RIGHT/LEFT HEART CATH AND CORONARY/GRAFT ANGIOGRAPHY N/A 09/27/2022    Procedure: RIGHT/LEFT HEART CATH AND CORONARY/GRAFT ANGIOGRAPHY;  Surgeon: Jordan, Peter M, MD;  Location: [facility];  Service: Cardiovascular;  Laterality: N/A;   SHOULDER OPEN ROTATOR CUFF REPAIR Right    TOTAL HIP ARTHROPLASTY Right 1999   TOTAL HIP ARTHROPLASTY Left 2008   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    Prior to Admission medications   Medication Sig Start Date End Date Taking? Authorizing Provider  apixaban (ELIQUIS) 5 MG TABS tablet TAKE 1 TABLET BY MOUTH TWICE A DAY 12/20/22  Yes McDowell, Samuel G, MD  atorvastatin (LIPITOR) 80 MG tablet TAKE 1 TABLET BY MOUTH EVERY DAY Patient taking differently: Take 80 mg by mouth daily. 11/30/22  Yes McDowell, Samuel G, MD  Iron, Ferrous Sulfate, 325 (65 Fe) MG TABS Take 325 mg by mouth daily. 12/02/22  Yes Zarwolo, Gloria, FNP  isosorbide mononitrate (IMDUR) 30 MG 24 hr tablet Take 30 mg by mouth daily. 12/02/22  Yes [provider]  KLOR-CON M10 10 MEQ tablet TAKE 3 TABLETS BY MOUTH DAILY. 12/17/22  Yes McDowell, Samuel   G, MD  metoprolol succinate (TOPROL-XL) 25 MG 24 hr tablet Take 1 tablet (25 mg total) by mouth daily. 11/23/22  Yes Gonfa, Taye T, MD  nitroGLYCERIN (NITROSTAT) 0.4 MG SL tablet Place 1 tablet (0.4 mg total) under the tongue every 5 (five) minutes x 3 doses as needed for chest pain (if no relief after 3rd dose, proceed to the ED for an evaluation or call 911). 09/05/22  Yes McDowell, Samuel G, MD  pantoprazole (PROTONIX) 40 MG tablet Take 1 tablet (40 mg total) by mouth 2 (two) times daily. 11/22/22 02/20/23 Yes Gonfa, Taye T, MD  torsemide (DEMADEX) 20 MG tablet Take 3 tablets (60 mg total) by mouth daily. 12/12/22 12/07/23 Yes Strader, Brittany M, PA-C  sucralfate (CARAFATE) 1 GM/10ML suspension Take 10 mLs (1 g total) by mouth  in the morning, at noon, and at bedtime. Patient not taking: Reported on 12/25/2022 11/29/22   Zarwolo, Gloria, FNP    Current Facility-Administered Medications  Medication Dose Route Frequency Provider Last Rate Last Admin   0.9 %  sodium chloride infusion   Intravenous PRN Emokpae, Courage, MD       acetaminophen (TYLENOL) tablet 650 mg  650 mg Oral Q6H PRN Emokpae, Courage, MD       Or   acetaminophen (TYLENOL) suppository 650 mg  650 mg Rectal Q6H PRN Emokpae, Courage, MD       atorvastatin (LIPITOR) tablet 80 mg  80 mg Oral Daily Emokpae, Courage, MD       bisacodyl (DULCOLAX) suppository 10 mg  10 mg Rectal Daily PRN Emokpae, Courage, MD       cefTRIAXone (ROCEPHIN) 1 g in sodium chloride 0.9 % 100 mL IVPB  1 g Intravenous Once Emokpae, Courage, MD 200 mL/hr at 12/25/22 1403 1 g at 12/25/22 1403   isosorbide mononitrate (IMDUR) 24 hr tablet 30 mg  30 mg Oral Once Roemhildt, Lorin T, PA-C       [START ON 12/26/2022] metoprolol succinate (TOPROL-XL) 24 hr tablet 25 mg  25 mg Oral Daily Emokpae, Courage, MD       ondansetron (ZOFRAN) tablet 4 mg  4 mg Oral Q6H PRN Emokpae, Courage, MD       Or   ondansetron (ZOFRAN) injection 4 mg  4 mg Intravenous Q6H PRN Emokpae, Courage, MD       pantoprazole (PROTONIX) injection 40 mg  40 mg Intravenous Q12H Emokpae, Courage, MD       polyethylene glycol (MIRALAX / GLYCOLAX) packet 17 g  17 g Oral Daily PRN Emokpae, Courage, MD       sodium chloride flush (NS) 0.9 % injection 3 mL  3 mL Intravenous Q12H Emokpae, Courage, MD       sodium chloride flush (NS) 0.9 % injection 3 mL  3 mL Intravenous Q12H Emokpae, Courage, MD       sodium chloride flush (NS) 0.9 % injection 3 mL  3 mL Intravenous PRN Emokpae, Courage, MD       sucralfate (CARAFATE) 1 GM/10ML suspension 1 g  1 g Oral TID WC & HS Emokpae, Courage, MD       traZODone (DESYREL) tablet 50 mg  50 mg Oral QHS PRN Emokpae, Courage, MD       Current Outpatient Medications  Medication Sig Dispense  Refill   apixaban (ELIQUIS) 5 MG TABS tablet TAKE 1 TABLET BY MOUTH TWICE A DAY 180 tablet 1   atorvastatin (LIPITOR) 80 MG tablet TAKE 1 TABLET BY MOUTH EVERY DAY (Patient   taking differently: Take 80 mg by mouth daily.) 90 tablet 1   Iron, Ferrous Sulfate, 325 (65 Fe) MG TABS Take 325 mg by mouth daily. 30 tablet 2   isosorbide mononitrate (IMDUR) 30 MG 24 hr tablet Take 30 mg by mouth daily.     KLOR-CON M10 10 MEQ tablet TAKE 3 TABLETS BY MOUTH DAILY. 270 tablet 1   metoprolol succinate (TOPROL-XL) 25 MG 24 hr tablet Take 1 tablet (25 mg total) by mouth daily. 90 tablet 1   nitroGLYCERIN (NITROSTAT) 0.4 MG SL tablet Place 1 tablet (0.4 mg total) under the tongue every 5 (five) minutes x 3 doses as needed for chest pain (if no relief after 3rd dose, proceed to the ED for an evaluation or call 911). 25 tablet 3   pantoprazole (PROTONIX) 40 MG tablet Take 1 tablet (40 mg total) by mouth 2 (two) times daily. 180 tablet 0   torsemide (DEMADEX) 20 MG tablet Take 3 tablets (60 mg total) by mouth daily. 270 tablet 3   sucralfate (CARAFATE) 1 GM/10ML suspension Take 10 mLs (1 g total) by mouth in the morning, at noon, and at bedtime. 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    Employer: RETIRED  Tobacco Use   Smoking status: Former    Packs/day: 0.50    Years: 2.00    Additional pack years: 0.00    Total pack years: 1.00     Types: Cigarettes    Quit date: 09/03/1966    Years since quitting: 56.3   Smokeless tobacco: Never  Vaping Use   Vaping Use: Never used  Substance and Sexual Activity   Alcohol use: Yes    Alcohol/week: 11.0 standard drinks of alcohol    Types: 7 Glasses of wine, 4 Cans of beer per week    Comment: a glass of wine with dinner/ beer on the weekend   Drug use: No   Sexual activity: Not on file  Other Topics Concern   Not on file  Social History Narrative   Lives with his wife.   Social Determinants of Health   Financial Resource Strain: Low Risk  (11/25/2022)   Overall Financial Resource Strain (CARDIA)    Difficulty of Paying Living Expenses: Not hard at all  Food Insecurity: No Food Insecurity (11/28/2022)   Hunger Vital Sign    Worried About Running Out of Food in the Last Year: Never true    Ran Out of Food in the Last Year: Never true  Transportation Needs: No Transportation Needs (11/28/2022)   PRAPARE - Transportation    Lack of Transportation (Medical): No    Lack of Transportation (Non-Medical): No  Physical Activity: Unknown (11/25/2022)   Exercise Vital Sign    Days of Exercise per Week: 0 days    Minutes of Exercise per Session: Not on file  Stress: No Stress Concern   Present (11/25/2022)   [employer] - Occupational Stress Questionnaire    Feeling of Stress : Not at all  Social Connections: Moderately Integrated (11/25/2022)   Social Connection and Isolation Panel [NHANES]    Frequency of Communication with Friends and Family: Once a week    Frequency of Social Gatherings with Friends and Family: Once a week    Attends Religious Services: More than 4 times per year    Active Member of Clubs or Organizations: Yes    Attends Club or Organization Meetings: More than 4 times per year    Marital Status: Married  Intimate Partner Violence: Not At Risk (09/24/2022)   Humiliation, Afraid, Rape, and Kick questionnaire    Fear of Current or  Ex-Partner: No    Emotionally Abused: No    Physically Abused: No    Sexually Abused: No    Review of Systems: Gen: Denies fever, chills, cold or flu like symptoms, presyncope, syncope.  Admits to weakness. CV: Denies chest pain currently.   Resp: Denies shortness of breath currently. GI: See HPI GU : Denies urinary burning, urinary frequency, urinary incontinence.  MS: Denies joint pain Derm: Denies rash Psych: Denies depression, anxiety. Heme: See HPI  Physical Exam: Vital signs in last 24 hours: Temp:  [96.6 F (35.9 C)-97.9 F (36.6 C)] 96.6 F (35.9 C) (04/23 1136) Pulse Rate:  [104-118] 104 (04/23 1400) Resp:  [16-22] 17 (04/23 1400) BP: (84-97)/(48-67) 84/50 (04/23 1400) SpO2:  [95 %-99 %] 95 % (04/23 1400)   General:   Alert,  Well-developed, well-nourished, pleasant and cooperative in NAD. Slight jaundice.  Head:  Normocephalic and atraumatic. Eyes:  Slight scleral icterus.  Ears:  Normal auditory acuity. Lungs:  Clear throughout to auscultation.   No wheezes, crackles, or rhonchi. No acute distress. Heart: Irregularly irregular rate and rhythm.  Abdomen:  Soft, nontender and nondistended. No masses, hepatosplenomegaly or hernias noted. Normal bowel sounds, without guarding, and without rebound.   Rectal:  Deferred Msk:  Symmetrical without gross deformities. Normal posture. Extremities: Trace pedal edema.  Chronic venous stasis changes. Neurologic:  Alert and  oriented x4;  grossly normal neurologically. Skin:  Intact without significant lesions or rashes. Psych:  Normal mood and affect.  Intake/Output from previous day: No intake/output data recorded. Intake/Output this shift: No intake/output data recorded.  Lab Results: Recent Labs    12/25/22 0917 12/25/22 1028  WBC 9.4  --   HGB 5.0* 5.1*  HCT 16.2* 15.0*  PLT 197  --    BMET Recent Labs    12/25/22 0919 12/25/22 1028  NA 130* 133*  K 4.3 4.4  CL 98 97*  CO2 20*  --   GLUCOSE 154* 151*   BUN 35* 35*  CREATININE 1.32* 1.50*  CALCIUM 8.3*  --    LFT Recent Labs    12/25/22 0919  PROT 5.9*  ALBUMIN 3.1*  AST 49*  ALT 43  ALKPHOS 245*  BILITOT 3.3*     Studies/Results: DG Chest 2 View  Result Date: 12/25/2022 CLINICAL DATA:  Chest pain. EXAM: CHEST - 2 VIEW COMPARISON:  Chest x-ray May 18 24. FINDINGS: Enlarged cardiac silhouette. Pulmonary vascular gesture. Mild diffuse interstitial prominence. Small bilateral pleural effusions. Overlying bibasilar opacities. No visible pneumothorax. Polyarticular degenerative change. CABG and median sternotomy. IMPRESSION: Cardiomegaly, pulmonary vascular congestion, bilateral pleural effusions and mild interstitial prominence, suggestive of pulmonary edema. Superimposed pneumonia at the lung bases is not excluded. Electronically Signed   By: Frederick S   Jones M.D.   On: 12/25/2022 09:48    Impression: 81 y.o. year old male with history of CAD s/p CABG, ischemic cardiomyopathy, atrial fibrillation on Eliquis, HTN, HLD, moderate to severe aortic stenosis, duodenal ulcer noted on EGD in March 2024, who presented to the emergency room this morning due to left chest pain.  He was found to be hypotensive, intermittently tachycardic in the emergency room, hemoglobin 5.0, fecal occult blood positive, creatinine elevated at 1.32, BNP 658, lactic acid 3.5, troponins elevated.  He has been admitted for further management/workup.  GI consulted for anemia, heme positive stool.  Acute on chronic anemia/heme positive stool: History of nonbleeding duodenal ulcer with clean ulcer base at the time of EGD 11/21/2022.  Patient reports compliance with PPI twice daily, but stopped Carafate about 1 week ago.  Reports melena had resolved, but returned over the last couple of days with last episode this morning.  Also noted some dull aching upper abdominal pain this morning. Denies NSAIDs.  He is chronically on Eliquis with last dose this morning.  Suspect  melena/acute on chronic anemia/heme positive stool likely secondary to bleeding from known duodenal ulcer in the setting of anticoagulation.  Recommend PPI twice daily, Carafate 3 times daily.  Will discuss with Dr. Castaneda whether or not repeat EGD is necessary. Not sure patient is a candidate in the setting of moderate to severe AAS.  Regardless, EGD will need to be delayed until he is medically stable and 48 hours after last dose of Eliquis.  Elevated troponin's:  Possibly secondary to demand ischemia. Appreciate cardiology input. Will need cardiac clearance if considering EGD.  Elevated bilirubin and alkaline phosphatase:  Persistently elevated alkaline phosphatase and bilirubin since March 2023.  AST/ALT became elevated as well on 11/29/2022 with AST 92, ALT 73, alk phos 512, total bilirubin 2.2.  Max T. bili was 3.8 in January 2024.  Abdominal ultrasound in January with cholelithiasis without cholecystitis or biliary duct dilation, probable fatty infiltration of the liver.  Today, total bilirubin 3.3, alk phos 245, AST 49, ALT 43.  He denies having abdominal pain at home, but did note some dull pain today across the upper abdomen. Abdominal exam benign at the time of my visit.   Etiology is unclear. Will plan to fractionate bilirubin and repeat RUQ US. Could also have a component of congestive hepatopathy.    Plan: Agree with transfusing 3 units PRBCs as ordered.  IV PPI BID Carafate TID Discuss possibility of EGD with Dr. Castaneda. Will need to be adequately resuscitated and cleared by cardiology if EGD is considered.   Continue to hold Eliquis for now. Last dose morning of 4/23.  Appreciate cardiology input.  RUQ US Fractionate bilirubin Continue supportive measures.   LOS: 0 days    12/25/2022, 2:28 PM   Berneda Piccininni, PA-C [facility]   

## 2022-12-25 NOTE — Progress Notes (Signed)
UNMATCHED BLOOD PRODUCT NOTE  Compare the patient ID on the blood tag to the patient ID on the hospital armband and Blood Bank armband. Then confirm the unit number on the blood tag matches the unit number on the blood product.  If a discrepancy is discovered return the product to blood bank immediately.   Blood Product Type: Packed Red Blood Cells  Unit #: (Found on blood product bag, begins with W) [id number]  Product Code #: (Found on blood product bag, begins with E) [id number]   Start Time: 2300  Starting Rate: 120 ml/hr  Rate increase/decreased  (if applicable): 150     ml/hr  Rate changed time (if applicable):2318    Stop Time: 0113   All Other Documentation should be documented within the Blood Admin Flowsheet per policy.

## 2022-12-25 NOTE — Consult Note (Signed)
@LOGO @   Referring Provider: Jeanella Flattery  Primary Care Physician:  [name], FNP Primary Gastroenterologist:  Dr. Jena Gauss  Date of Admission: 12/25/22 Date of Consultation: 12/26/22  Reason for Consultation:  Anemia, history of duodenal ulcer  HPI:  Leonard Cisneros is a 82 y.o. year old male with history of CAD s/p CABG, ischemic cardiomyopathy, atrial fibrillation on Eliquis, HTN, HLD, moderate to severe aortic stenosis, duodenal ulcer noted on EGD in March 2024, who presented to the emergency room this morning due to left chest pain.  ED course: Hypotensive with BP as low as 83/50, intermittent tachycardia, up to 118, otherwise vital signs stable.  Labs remarkable for hemoglobin 5.0 (down from 8.9, 3 weeks ago), fecal occult blood positive. Sodium 130, creatinine 1.32, AST 49, alk phos 245, total bilirubin 3.3. BNP 658 Lactic acid 3.5 Troponin 216>> 220  Chest x-ray with cardiomegaly, pulmonary vascular congestion, bilateral pleural effusions, mild interstitial prominence suggestive of pulmonary edema, superimposed pneumonia at lung bases not excluded.  ED provider consulted with cardiology who recommended holding heparin in setting of GI bleed and starting Imdur for chest pain.  GI consulted for bleeding, hospitalist consulted for admission.   Today:  Woke up with chest pain early this morning. Took 3 nitro without improvement and came to the ER. Before this morning, would have intermittent pain related to activity. Would sit down and symptoms would disappear. Lack of energy for the last week or so. Associated SOB.   Weight loss since admitted in March with CHF exacerbation. Started on Lasix at that time due to volume overload. Was taking Lasix 4 times a day. Decreased to 3 times a day 2 weeks ago. Has continues to lose weight.   Has not had an appetite for the last several months. Intermittent gagging with fluid coming up in his mouth. No heartburn. Some upper  abdominal pain today described as a dull ache, but not at home. No postprandial symptoms.   No brbpr. Stools black the past couple of days. Last episode this morning.   Occasional Tylenol. No NSAIDs.   Taking PPI BID. Has been taking carafate but stopped after his last OV.  Last dose of Eliquis was this morning.  Taking iron daily.    EGD 11/21/2022 at [facility]: -Z-line regular -Normal esophagus -Gastritis s/p biopsy -Nonbleeding duodenal ulcer with clean ulcer base -Mucosal nodule in the duodenum s/p biopsy -Duodenal nodule with polypoid fragment of benign small bowel mucosa with focal foveolar metaplasia suggestive of peptic injury -Gastric biopsies negative for H. pylori or intestinal metaplasia. -Advised to avoid NSAIDs, resume Eliquis in 3 days and use PPI twice daily for 3 months.   Colonoscopy in January 2019: -entire colon normal -no repeat colonoscopy due to age  Past Medical History:  Diagnosis Date   Aortic stenosis    Atrial flutter    a. s/p remote ablation by Dr. Ladona Ridgel.   CAD (coronary artery disease)    a. s/p CABG 2003. b. 01/2016: unstable angina - s/p BMS to SVG-ramus intermediate, patent LIMA-mLAD, patent RIMA-dRCA.    Chronic lower back pain    Essential hypertension    Hyperlipidemia    Ischemic cardiomyopathy    a. Cath 01/2016 -  EF 50-55% with mild distal inferior hypocontractility.   Peripheral neuropathy 04/04/2014   Permanent atrial fibrillation     Past Surgical History:  Procedure Laterality Date   BACK SURGERY     BIOPSY  11/21/2022   Procedure: BIOPSY;  Surgeon:  Napoleon Form, MD;  Location: [facility];  Service: Gastroenterology;;   CARDIAC CATHETERIZATION  ~ 2000; 2003   CARDIAC CATHETERIZATION N/A 02/01/2016   Procedure: Left Heart Cath and Cors/Grafts Angiography;  Surgeon: Lennette Bihari, MD;  Location: [facility];  Service: Cardiovascular;  Laterality: N/A;   CARDIAC CATHETERIZATION N/A 02/01/2016   Procedure:  Coronary Stent Intervention;  Surgeon: Lennette Bihari, MD;  Location: [facility];  Service: Cardiovascular;  Laterality: N/A;   CATARACT EXTRACTION W/PHACO Left 02/09/2019   Procedure: CATARACT EXTRACTION PHACO AND INTRAOCULAR LENS PLACEMENT (IOC);  Surgeon: Fabio Pierce, MD;  Location: [facility];  Service: Ophthalmology;  Laterality: Left;  CDE: 27.70   CATARACT EXTRACTION W/PHACO Right 02/23/2019   Procedure: CATARACT EXTRACTION PHACO AND INTRAOCULAR LENS PLACEMENT RIGHT EYE;  Surgeon: Fabio Pierce, MD;  Location: [facility];  Service: Ophthalmology;  Laterality: Right;  CDE: 10.15   COLONOSCOPY  06/04/2002   Normal colon and rectum   COLONOSCOPY  07/08/2012   Procedure: COLONOSCOPY;  Surgeon: Corbin Ade, MD;  Location: [facility];  Service: Endoscopy;  Laterality: N/A;  11:30   COLONOSCOPY N/A 10/02/2017   Procedure: COLONOSCOPY;  Surgeon: Corbin Ade, MD;  Location: [facility];  Service: Endoscopy;  Laterality: N/A;  10:30am   CORONARY ANGIOPLASTY WITH STENT PLACEMENT  02/01/2016   CORONARY ARTERY BYPASS GRAFT  2003   LIMA to LAD,LIMA to distal RCA,SVG to ramus intermediate vessel.   ESOPHAGOGASTRODUODENOSCOPY (EGD) WITH PROPOFOL N/A 11/21/2022   Procedure: ESOPHAGOGASTRODUODENOSCOPY (EGD) WITH PROPOFOL;  Surgeon: Napoleon Form, MD;  Location: [facility];  Service: Gastroenterology;  Laterality: N/A;   FEMORAL REVISION Right 03/30/2019   Procedure: Femoral revision right total hip arthroplasty;  Surgeon: Ollen Gross, MD;  Location: [facility];  Service: Orthopedics;  Laterality: Right;    FOREIGN BODY REMOVAL Right 04/02/2014   Procedure: FOREIGN BODY REMOVAL RIGHT FOOT;  Surgeon: Dalia Heading, MD;  Location: [facility];  Service: General;  Laterality: Right;   JOINT REPLACEMENT     JOINT REPLACEMENT     LUMBAR DISC SURGERY     REVISION TOTAL HIP ARTHROPLASTY Bilateral 2005-2012   right-left   RIGHT/LEFT HEART CATH AND CORONARY/GRAFT ANGIOGRAPHY N/A 09/27/2022    Procedure: RIGHT/LEFT HEART CATH AND CORONARY/GRAFT ANGIOGRAPHY;  Surgeon: [place], Peter M, MD;  Location: [facility];  Service: Cardiovascular;  Laterality: N/A;   SHOULDER OPEN ROTATOR CUFF REPAIR Right    TOTAL HIP ARTHROPLASTY Right 1999   TOTAL HIP ARTHROPLASTY Left 2008   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    Prior to Admission medications   Medication Sig Start Date End Date Taking? Authorizing Provider  apixaban (ELIQUIS) 5 MG TABS tablet TAKE 1 TABLET BY MOUTH TWICE A DAY 12/20/22  Yes [name], MD  atorvastatin (LIPITOR) 80 MG tablet TAKE 1 TABLET BY MOUTH EVERY DAY Patient taking differently: Take 80 mg by mouth daily. 11/30/22  Yes [name], MD  Iron, Ferrous Sulfate, 325 (65 Fe) MG TABS Take 325 mg by mouth daily. 12/02/22  Yes [name], FNP  isosorbide mononitrate (IMDUR) 30 MG 24 hr tablet Take 30 mg by mouth daily. 12/02/22  Yes [provider]  KLOR-CON M10 10 MEQ tablet TAKE 3 TABLETS BY MOUTH DAILY. 12/17/22  Yes [name]  G, MD  metoprolol succinate (TOPROL-XL) 25 MG 24 hr tablet Take 1 tablet (25 mg total) by mouth daily. 11/23/22  Yes Almon Hercules, MD  nitroGLYCERIN (NITROSTAT) 0.4 MG SL tablet Place 1 tablet (0.4 mg total) under the tongue every 5 (five) minutes x 3 doses as needed for chest pain (if no relief after 3rd dose, proceed to the ED for an evaluation or call 911). 09/05/22  Yes [name], MD  pantoprazole (PROTONIX) 40 MG tablet Take 1 tablet (40 mg total) by mouth 2 (two) times daily. 11/22/22 02/20/23 Yes Almon Hercules, MD  torsemide (DEMADEX) 20 MG tablet Take 3 tablets (60 mg total) by mouth daily. 12/12/22 12/07/23 Yes Strader, Lennart Pall, PA-C  sucralfate (CARAFATE) 1 GM/10ML suspension Take 10 mLs (1 g total) by mouth  in the morning, at noon, and at bedtime. Patient not taking: Reported on 12/25/2022 11/29/22   [name], FNP    Current Facility-Administered Medications  Medication Dose Route Frequency Provider Last Rate Last Admin   0.9 %  sodium chloride infusion   Intravenous PRN Mariea Clonts, Courage, MD       acetaminophen (TYLENOL) tablet 650 mg  650 mg Oral Q6H PRN Emokpae, Courage, MD       Or   acetaminophen (TYLENOL) suppository 650 mg  650 mg Rectal Q6H PRN Emokpae, Courage, MD       atorvastatin (LIPITOR) tablet 80 mg  80 mg Oral Daily Emokpae, Courage, MD       bisacodyl (DULCOLAX) suppository 10 mg  10 mg Rectal Daily PRN Emokpae, Courage, MD       cefTRIAXone (ROCEPHIN) 1 g in sodium chloride 0.9 % 100 mL IVPB  1 g Intravenous Once Emokpae, Courage, MD 200 mL/hr at 12/25/22 1403 1 g at 12/25/22 1403   isosorbide mononitrate (IMDUR) 24 hr tablet 30 mg  30 mg Oral Once Roemhildt, Lorin T, PA-C       [START ON 12/26/2022] metoprolol succinate (TOPROL-XL) 24 hr tablet 25 mg  25 mg Oral Daily Emokpae, Courage, MD       ondansetron (ZOFRAN) tablet 4 mg  4 mg Oral Q6H PRN Emokpae, Courage, MD       Or   ondansetron (ZOFRAN) injection 4 mg  4 mg Intravenous Q6H PRN Emokpae, Courage, MD       pantoprazole (PROTONIX) injection 40 mg  40 mg Intravenous Q12H Emokpae, Courage, MD       polyethylene glycol (MIRALAX / GLYCOLAX) packet 17 g  17 g Oral Daily PRN Emokpae, Courage, MD       sodium chloride flush (NS) 0.9 % injection 3 mL  3 mL Intravenous Q12H Emokpae, Courage, MD       sodium chloride flush (NS) 0.9 % injection 3 mL  3 mL Intravenous Q12H Emokpae, Courage, MD       sodium chloride flush (NS) 0.9 % injection 3 mL  3 mL Intravenous PRN Emokpae, Courage, MD       sucralfate (CARAFATE) 1 GM/10ML suspension 1 g  1 g Oral TID WC & HS Emokpae, Courage, MD       traZODone (DESYREL) tablet 50 mg  50 mg Oral QHS PRN Emokpae, Courage, MD       Current Outpatient Medications  Medication Sig Dispense  Refill   apixaban (ELIQUIS) 5 MG TABS tablet TAKE 1 TABLET BY MOUTH TWICE A DAY 180 tablet 1   atorvastatin (LIPITOR) 80 MG tablet TAKE 1 TABLET BY MOUTH EVERY DAY (Patient  taking differently: Take 80 mg by mouth daily.) 90 tablet 1   Iron, Ferrous Sulfate, 325 (65 Fe) MG TABS Take 325 mg by mouth daily. 30 tablet 2   isosorbide mononitrate (IMDUR) 30 MG 24 hr tablet Take 30 mg by mouth daily.     KLOR-CON M10 10 MEQ tablet TAKE 3 TABLETS BY MOUTH DAILY. 270 tablet 1   metoprolol succinate (TOPROL-XL) 25 MG 24 hr tablet Take 1 tablet (25 mg total) by mouth daily. 90 tablet 1   nitroGLYCERIN (NITROSTAT) 0.4 MG SL tablet Place 1 tablet (0.4 mg total) under the tongue every 5 (five) minutes x 3 doses as needed for chest pain (if no relief after 3rd dose, proceed to the ED for an evaluation or call 911). 25 tablet 3   pantoprazole (PROTONIX) 40 MG tablet Take 1 tablet (40 mg total) by mouth 2 (two) times daily. 180 tablet 0   torsemide (DEMADEX) 20 MG tablet Take 3 tablets (60 mg total) by mouth daily. 270 tablet 3   sucralfate (CARAFATE) 1 GM/10ML suspension Take 10 mLs (1 g total) by mouth in the morning, at noon, and at bedtime. (Patient not taking: Reported on 12/25/2022) 420 mL 2    Allergies as of 12/25/2022 - Review Complete 12/25/2022  Allergen Reaction Noted   Augmentin [amoxicillin-pot clavulanate] Nausea And Vomiting and Other (See Comments) 01/14/2013    Family History  Problem Relation Age of Onset   Heart attack Brother    Colon cancer Neg Hx     Social History   Socioeconomic History   Marital status: Married    Spouse name: Larita Fife   Number of children: 2   Years of education: Not on file   Highest education level: Bachelor's degree (e.g., BA, AB, BS)  Occupational History   Occupation: PT Holiday representative: RETIRED  Tobacco Use   Smoking status: Former    Packs/day: 0.50    Years: 2.00    Additional pack years: 0.00    Total pack years: 1.00     Types: Cigarettes    Quit date: 09/03/1966    Years since quitting: 56.3   Smokeless tobacco: Never  Vaping Use   Vaping Use: Never used  Substance and Sexual Activity   Alcohol use: Yes    Alcohol/week: 11.0 standard drinks of alcohol    Types: 7 Glasses of wine, 4 Cans of beer per week    Comment: a glass of wine with dinner/ beer on the weekend   Drug use: No   Sexual activity: Not on file  Other Topics Concern   Not on file  Social History Narrative   Lives with his wife.   Social Determinants of Health   Financial Resource Strain: Low Risk  (11/25/2022)   Overall Financial Resource Strain (CARDIA)    Difficulty of Paying Living Expenses: Not hard at all  Food Insecurity: No Food Insecurity (11/28/2022)   Hunger Vital Sign    Worried About Running Out of Food in the Last Year: Never true    Ran Out of Food in the Last Year: Never true  Transportation Needs: No Transportation Needs (11/28/2022)   PRAPARE - Administrator, Civil Service (Medical): No    Lack of Transportation (Non-Medical): No  Physical Activity: Unknown (11/25/2022)   Exercise Vital Sign    Days of Exercise per Week: 0 days    Minutes of Exercise per Session: Not on file  Stress: No Stress Concern  Present (11/25/2022)   [employer] of Occupational Health - Occupational Stress Questionnaire    Feeling of Stress : Not at all  Social Connections: Moderately Integrated (11/25/2022)   Social Connection and Isolation Panel [NHANES]    Frequency of Communication with Friends and Family: Once a week    Frequency of Social Gatherings with Friends and Family: Once a week    Attends Religious Services: More than 4 times per year    Active Member of [employer] or Organizations: Yes    Attends Engineer, structural: More than 4 times per year    Marital Status: Married  Catering manager Violence: Not At Risk (09/24/2022)   Humiliation, Afraid, Rape, and Kick questionnaire    Fear of Current or  Ex-Partner: No    Emotionally Abused: No    Physically Abused: No    Sexually Abused: No    Review of Systems: Gen: Denies fever, chills, cold or flu like symptoms, presyncope, syncope.  Admits to weakness. CV: Denies chest pain currently.   Resp: Denies shortness of breath currently. GI: See HPI GU : Denies urinary burning, urinary frequency, urinary incontinence.  MS: Denies joint pain Derm: Denies rash Psych: Denies depression, anxiety. Heme: See HPI  Physical Exam: Vital signs in last 24 hours: Temp:  [96.6 F (35.9 C)-97.9 F (36.6 C)] 96.6 F (35.9 C) (04/23 1136) Pulse Rate:  [104-118] 104 (04/23 1400) Resp:  [16-22] 17 (04/23 1400) BP: (84-97)/(48-67) 84/50 (04/23 1400) SpO2:  [95 %-99 %] 95 % (04/23 1400)   General:   Alert,  Well-developed, well-nourished, pleasant and cooperative in NAD. Slight jaundice.  Head:  Normocephalic and atraumatic. Eyes:  Slight scleral icterus.  Ears:  Normal auditory acuity. Lungs:  Clear throughout to auscultation.   No wheezes, crackles, or rhonchi. No acute distress. Heart: Irregularly irregular rate and rhythm.  Abdomen:  Soft, nontender and nondistended. No masses, hepatosplenomegaly or hernias noted. Normal bowel sounds, without guarding, and without rebound.   Rectal:  Deferred Msk:  Symmetrical without gross deformities. Normal posture. Extremities: Trace pedal edema.  Chronic venous stasis changes. Neurologic:  Alert and  oriented x4;  grossly normal neurologically. Skin:  Intact without significant lesions or rashes. Psych:  Normal mood and affect.  Intake/Output from previous day: No intake/output data recorded. Intake/Output this shift: No intake/output data recorded.  Lab Results: Recent Labs    12/25/22 0917 12/25/22 1028  WBC 9.4  --   HGB 5.0* 5.1*  HCT 16.2* 15.0*  PLT 197  --    BMET Recent Labs    12/25/22 0919 12/25/22 1028  NA 130* 133*  K 4.3 4.4  CL 98 97*  CO2 20*  --   GLUCOSE 154* 151*   BUN 35* 35*  CREATININE 1.32* 1.50*  CALCIUM 8.3*  --    LFT Recent Labs    12/25/22 0919  PROT 5.9*  ALBUMIN 3.1*  AST 49*  ALT 43  ALKPHOS 245*  BILITOT 3.3*     Studies/Results: DG Chest 2 View  Result Date: 12/25/2022 CLINICAL DATA:  Chest pain. EXAM: CHEST - 2 VIEW COMPARISON:  Chest x-ray May 18 24. FINDINGS: Enlarged cardiac silhouette. Pulmonary vascular gesture. Mild diffuse interstitial prominence. Small bilateral pleural effusions. Overlying bibasilar opacities. No visible pneumothorax. Polyarticular degenerative change. CABG and median sternotomy. IMPRESSION: Cardiomegaly, pulmonary vascular congestion, bilateral pleural effusions and mild interstitial prominence, suggestive of pulmonary edema. Superimposed pneumonia at the lung bases is not excluded. Electronically Signed   By: Thornton Dales  Yetta Barre M.D.   On: 12/25/2022 09:48    Impression: 82 y.o. year old male with history of CAD s/p CABG, ischemic cardiomyopathy, atrial fibrillation on Eliquis, HTN, HLD, moderate to severe aortic stenosis, duodenal ulcer noted on EGD in March 2024, who presented to the emergency room this morning due to left chest pain.  He was found to be hypotensive, intermittently tachycardic in the emergency room, hemoglobin 5.0, fecal occult blood positive, creatinine elevated at 1.32, BNP 658, lactic acid 3.5, troponins elevated.  He has been admitted for further management/workup.  GI consulted for anemia, heme positive stool.  Acute on chronic anemia/heme positive stool: History of nonbleeding duodenal ulcer with clean ulcer base at the time of EGD 11/21/2022.  Patient reports compliance with PPI twice daily, but stopped Carafate about 1 week ago.  Reports melena had resolved, but returned over the last couple of days with last episode this morning.  Also noted some dull aching upper abdominal pain this morning. Denies NSAIDs.  He is chronically on Eliquis with last dose this morning.  Suspect  melena/acute on chronic anemia/heme positive stool likely secondary to bleeding from known duodenal ulcer in the setting of anticoagulation.  Recommend PPI twice daily, Carafate 3 times daily.  Will discuss with Dr. Levon Hedger whether or not repeat EGD is necessary. Not sure patient is a candidate in the setting of moderate to severe AAS.  Regardless, EGD will need to be delayed until he is medically stable and 48 hours after last dose of Eliquis.  Elevated troponin's:  Possibly secondary to demand ischemia. Appreciate cardiology input. Will need cardiac clearance if considering EGD.  Elevated bilirubin and alkaline phosphatase:  Persistently elevated alkaline phosphatase and bilirubin since March 2023.  AST/ALT became elevated as well on 11/29/2022 with AST 92, ALT 73, alk phos 512, total bilirubin 2.2.  Max T. Ludwig Clarks was 3.8 in January 2024.  Abdominal ultrasound in January with cholelithiasis without cholecystitis or biliary duct dilation, probable fatty infiltration of the liver.  Today, total bilirubin 3.3, alk phos 245, AST 49, ALT 43.  He denies having abdominal pain at home, but did note some dull pain today across the upper abdomen. Abdominal exam benign at the time of my visit.   Etiology is unclear. Will plan to fractionate bilirubin and repeat RUQ [place]. Could also have a component of congestive hepatopathy.    Plan: Agree with transfusing 3 units PRBCs as ordered.  IV PPI BID Carafate TID Discuss possibility of EGD with Dr. Levon Hedger. Will need to be adequately resuscitated and cleared by cardiology if EGD is considered.   Continue to hold Eliquis for now. Last dose morning of 4/23.  Appreciate cardiology input.  RUQ [place] Fractionate bilirubin Continue supportive measures.   LOS: 0 days    12/25/2022, 2:28 PM   Ermalinda Memos, [facility]

## 2022-12-26 DIAGNOSIS — K922 Gastrointestinal hemorrhage, unspecified: Secondary | ICD-10-CM | POA: Insufficient documentation

## 2022-12-26 DIAGNOSIS — Z8679 Personal history of other diseases of the circulatory system: Secondary | ICD-10-CM

## 2022-12-26 DIAGNOSIS — D62 Acute posthemorrhagic anemia: Secondary | ICD-10-CM | POA: Insufficient documentation

## 2022-12-26 DIAGNOSIS — I2510 Atherosclerotic heart disease of native coronary artery without angina pectoris: Secondary | ICD-10-CM | POA: Diagnosis not present

## 2022-12-26 DIAGNOSIS — I10 Essential (primary) hypertension: Secondary | ICD-10-CM

## 2022-12-26 DIAGNOSIS — R578 Other shock: Secondary | ICD-10-CM

## 2022-12-26 DIAGNOSIS — D649 Anemia, unspecified: Secondary | ICD-10-CM | POA: Diagnosis not present

## 2022-12-26 DIAGNOSIS — E872 Acidosis, unspecified: Secondary | ICD-10-CM

## 2022-12-26 DIAGNOSIS — I4891 Unspecified atrial fibrillation: Secondary | ICD-10-CM

## 2022-12-26 DIAGNOSIS — R571 Hypovolemic shock: Secondary | ICD-10-CM | POA: Diagnosis not present

## 2022-12-26 DIAGNOSIS — I350 Nonrheumatic aortic (valve) stenosis: Secondary | ICD-10-CM | POA: Diagnosis not present

## 2022-12-26 DIAGNOSIS — R079 Chest pain, unspecified: Secondary | ICD-10-CM | POA: Diagnosis not present

## 2022-12-26 DIAGNOSIS — D500 Iron deficiency anemia secondary to blood loss (chronic): Secondary | ICD-10-CM | POA: Diagnosis not present

## 2022-12-26 LAB — COMPREHENSIVE METABOLIC PANEL
ALT: 44 U/L (ref 0–44)
AST: 47 U/L — ABNORMAL HIGH (ref 15–41)
Albumin: 3 g/dL — ABNORMAL LOW (ref 3.5–5.0)
Alkaline Phosphatase: 251 U/L — ABNORMAL HIGH (ref 38–126)
Anion gap: 8 (ref 5–15)
BUN: 34 mg/dL — ABNORMAL HIGH (ref 8–23)
CO2: 25 mmol/L (ref 22–32)
Calcium: 8.4 mg/dL — ABNORMAL LOW (ref 8.9–10.3)
Chloride: 101 mmol/L (ref 98–111)
Creatinine, Ser: 1.3 mg/dL — ABNORMAL HIGH (ref 0.61–1.24)
GFR, Estimated: 55 mL/min — ABNORMAL LOW (ref >60)
Glucose, Bld: 125 mg/dL — ABNORMAL HIGH (ref 70–99)
Potassium: 4.3 mmol/L (ref 3.5–5.1)
Sodium: 134 mmol/L — ABNORMAL LOW (ref 135–145)
Total Bilirubin: 4.8 mg/dL — ABNORMAL HIGH (ref 0.3–1.2)
Total Protein: 5.8 g/dL — ABNORMAL LOW (ref 6.5–8.1)

## 2022-12-26 LAB — CBC
HCT: 23.6 % — ABNORMAL LOW (ref 39.0–52.0)
HCT: 27 % — ABNORMAL LOW (ref 39.0–52.0)
Hemoglobin: 7.7 g/dL — ABNORMAL LOW (ref 13.0–17.0)
Hemoglobin: 8.8 g/dL — ABNORMAL LOW (ref 13.0–17.0)
MCH: 34.7 pg — ABNORMAL HIGH (ref 26.0–34.0)
MCH: 34.5 pg — ABNORMAL HIGH (ref 26.0–34.0)
MCHC: 32.6 g/dL (ref 30.0–36.0)
MCHC: 32.6 g/dL (ref 30.0–36.0)
MCV: 106.3 fL — ABNORMAL HIGH (ref 80.0–100.0)
MCV: 105.9 fL — ABNORMAL HIGH (ref 80.0–100.0)
Platelets: 191 10*3/uL (ref 150–400)
Platelets: 180 10*3/uL (ref 150–400)
RBC: 2.22 MIL/uL — ABNORMAL LOW (ref 4.22–5.81)
RBC: 2.55 MIL/uL — ABNORMAL LOW (ref 4.22–5.81)
RDW: 26 % — ABNORMAL HIGH (ref 11.5–15.5)
RDW: 25.6 % — ABNORMAL HIGH (ref 11.5–15.5)
WBC: 10.8 10*3/uL — ABNORMAL HIGH (ref 4.0–10.5)
WBC: 11.4 10*3/uL — ABNORMAL HIGH (ref 4.0–10.5)
nRBC: 0.8 % — ABNORMAL HIGH (ref 0.0–0.2)
nRBC: 0.8 % — ABNORMAL HIGH (ref 0.0–0.2)

## 2022-12-26 LAB — PREPARE RBC (CROSSMATCH)

## 2022-12-26 LAB — PROTIME-INR
INR: 1.9 — ABNORMAL HIGH (ref 0.8–1.2)
Prothrombin Time: 21.6 seconds — ABNORMAL HIGH (ref 11.4–15.2)

## 2022-12-26 LAB — TYPE AND SCREEN: Unit division: 0

## 2022-12-26 LAB — GLUCOSE, CAPILLARY
Glucose-Capillary: 100 mg/dL — ABNORMAL HIGH (ref 70–99)
Glucose-Capillary: 110 mg/dL — ABNORMAL HIGH (ref 70–99)
Glucose-Capillary: 113 mg/dL — ABNORMAL HIGH (ref 70–99)
Glucose-Capillary: 114 mg/dL — ABNORMAL HIGH (ref 70–99)
Glucose-Capillary: 127 mg/dL — ABNORMAL HIGH (ref 70–99)
Glucose-Capillary: 128 mg/dL — ABNORMAL HIGH (ref 70–99)
Glucose-Capillary: 92 mg/dL (ref 70–99)

## 2022-12-26 LAB — BPAM RBC
ISSUE DATE / TIME: 202404240242
ISSUE DATE / TIME: 202404241518
Unit Type and Rh: 5100

## 2022-12-26 LAB — HEMOGLOBIN AND HEMATOCRIT, BLOOD
HCT: 23.6 % — ABNORMAL LOW (ref 39.0–52.0)
Hemoglobin: 7.8 g/dL — ABNORMAL LOW (ref 13.0–17.0)

## 2022-12-26 LAB — MRSA NEXT GEN BY PCR, NASAL: MRSA by PCR Next Gen: NOT DETECTED

## 2022-12-26 LAB — PHOSPHORUS: Phosphorus: 4.2 mg/dL (ref 2.5–4.6)

## 2022-12-26 LAB — OCCULT BLOOD, POC DEVICE: Fecal Occult Bld: POSITIVE — AB

## 2022-12-26 MED ORDER — SODIUM CHLORIDE 0.9 % IV SOLN: 250.0000 mL | INTRAVENOUS | Status: DC

## 2022-12-26 MED ORDER — SODIUM CHLORIDE 0.9% IV SOLUTION: Freq: Once | INTRAVENOUS | Status: AC

## 2022-12-26 MED ORDER — NOREPINEPHRINE 4 MG/250ML-% IV SOLN
2.0000 ug/min | INTRAVENOUS | Status: DC
  Administered 2022-12-26: 2 ug/min via INTRAVENOUS
  Filled 2022-12-26: qty 250

## 2022-12-26 MED ORDER — FUROSEMIDE 10 MG/ML IJ SOLN
20.0000 mg | Freq: Once | INTRAMUSCULAR | Status: AC
  Administered 2022-12-26: 20 mg via INTRAVENOUS
  Filled 2022-12-26: qty 2

## 2022-12-26 NOTE — TOC Initial Note (Signed)
Transition of Care [facility]) - Initial/Assessment Note    Patient Details  Name: Leonard Cisneros MRN: [id number] Date of Birth: 10/14/1940  Transition of Care [facility]) CM/SW Contact:    Elliot Gault, LCSW Phone Number: 12/26/2022, 10:58 AM  Clinical Narrative:                  Pt admitted from home. Pt with high readmission risk score. Met with pt and his wife at bedside to assess and review dc planning.  Pt and his wife reside together and plan is for return to home at dc. Pt is independent in ADLs. His wife assists with medication management. Pt is able to get to appointments and obtain medications without difficulty. He is not currently receiving any HH services.  TOC will follow and continue to assist with dc planning.    Expected Discharge Plan: Home w Home Health Services Barriers to Discharge: Continued Medical Work up   Patient Goals and CMS Choice Patient states their goals for this hospitalization and ongoing recovery are:: go home          Expected Discharge Plan and Services In-house Referral: Clinical Social Work     Living arrangements for the past 2 months: Single Family Home                                      Prior Living Arrangements/Services Living arrangements for the past 2 months: Single Family Home Lives with:: Spouse Patient language and need for interpreter reviewed:: Yes Do you feel safe going back to the place where you live?: Yes      Need for Family Participation in Patient Care: Yes (Comment) Care giver support system in place?: Yes (comment) Current home services: DME Criminal Activity/Legal Involvement Pertinent to Current Situation/Hospitalization: No - Comment as needed  Activities of Daily Living Home Assistive Devices/Equipment: Cane (specify quad or straight), Eyeglasses, Crutches ADL Screening (condition at time of admission) [name] cognitive ability adequate to safely complete daily activities?: Yes Is the patient  deaf or have difficulty hearing?: Yes Does the patient have difficulty seeing, even when wearing glasses/contacts?: No Does the patient have difficulty concentrating, remembering, or making decisions?: No Patient able to express need for assistance with ADLs?: Yes Does the patient have difficulty dressing or bathing?: No Independently performs ADLs?: Yes (appropriate for developmental age) Does the patient have difficulty walking or climbing stairs?: No Weakness of Legs: None Weakness of Arms/Hands: None  Permission Sought/Granted                  Emotional Assessment Appearance:: Appears stated age Attitude/Demeanor/Rapport: Engaged Affect (typically observed): Pleasant Orientation: : Oriented to Self, Oriented to Place, Oriented to  Time, Oriented to Situation Alcohol / Substance Use: Not Applicable Psych Involvement: No (comment)  Admission diagnosis:  Acute kidney injury [N17.9] Chest pain [R07.9] Severe anemia [D64.9] Gastrointestinal hemorrhage, unspecified gastrointestinal hemorrhage type [K92.2] Chest pain, unspecified type [R07.9] Patient Active Problem List   Diagnosis Date Noted   Hemorrhagic shock 12/26/2022   Acute blood loss anemia 12/26/2022   Upper GI bleeding 12/26/2022   Lactic acidosis 12/26/2022   Coronary artery disease involving native coronary artery of native heart without angina pectoris 12/26/2022   Hypovolemic shock 12/26/2022   Duodenal ulcer 11/21/2022   Gastritis and gastroduodenitis 11/21/2022   Heme positive stool 11/20/2022   Melena 11/20/2022   Anemia due to chronic blood loss  11/20/2022   History of CAD (coronary artery disease) 11/19/2022   Elevated troponin 11/19/2022   SOB (shortness of breath) 11/18/2022   Acute respiratory failure with hypoxia 09/27/2022   Nonrheumatic aortic valve stenosis 09/27/2022   Unstable angina 09/27/2022   Acute heart failure 09/24/2022   Hypokalemia 09/24/2022   Hyponatremia 09/24/2022   Abnormal  liver enzymes 09/24/2022   Hyperbilirubinemia 09/24/2022   Volume overload 09/24/2022   Acquired thrombophilia 09/24/2022   Microcytic anemia 09/24/2022   Cellulitis 11/12/2021   ETOH abuse 11/12/2021   Ambulatory dysfunction 11/12/2021   Severe pulmonary hypertension 11/12/2021   Moderate aortic stenosis 11/12/2021   Systolic congestive heart failure 11/12/2021   Lumbar post-laminectomy syndrome 06/15/2021   Chronic pain syndrome 06/15/2021   Persistent atrial fibrillation 04/09/2019   Atrial fibrillation with RVR 04/08/2019   Acute on chronic diastolic congestive heart failure    Macrocytic anemia    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   Chest pain 10/22/2018   Physical deconditioning 04/26/2018   Gait instability 04/26/2018   Closed fracture of greater trochanter of right femur with routine healing 04/20/18 04/25/2018   Leg pain 04/24/2018   Fall 04/21/2018   PAD (peripheral artery disease) 04/21/2018   Hx of adenomatous colonic polyps 08/20/2017   Ischemic cardiomyopathy 02/02/2016   CAD S/P percutaneous coronary angioplasty 02/01/2016   Exertional angina 01/28/2016   Hyperlipidemia LDL goal <70 01/28/2016   Neuropathic pain 06/13/2015   Mild obesity 04/20/2015   Essential hypertension 04/20/2015   Unspecified hereditary and idiopathic peripheral neuropathy 04/04/2014   Foreign body (FB) in soft tissue 03/31/2014   Impaired glucose metabolism 03/31/2014   Obesity (BMI 30-39.9) 03/31/2014   Hx of CABG 01/04/2014   Rotator cuff tear, left 10/22/2013   Rotator cuff syndrome of left shoulder 10/22/2013   Screening for colon cancer 06/18/2012   High risk medication use 06/18/2012   PCP:  [name], FNP Pharmacy:   [name] [phone] - [place], [place] - [address] AT [facility] [address] [place] [address] Phone: [phone] Fax: [phone]  [facility] [address] [place] [address] Phone:  [phone] Fax: [phone]     Social Determinants of Health (SDOH) Social History: SDOH Screenings   Food Insecurity: No Food Insecurity (12/25/2022)  Housing: Low Risk  (12/25/2022)  Transportation Needs: No Transportation Needs (12/25/2022)  Utilities: Not At Risk (12/25/2022)  Alcohol Screen: Low Risk  (11/25/2022)  Depression (PHQ2-9): Low Risk  (11/29/2022)  Financial Resource Strain: Low Risk  (11/25/2022)  Physical Activity: Unknown (11/25/2022)  Social Connections: Moderately Integrated (11/25/2022)  Stress: No Stress Concern Present (11/25/2022)  Tobacco Use: Medium Risk (12/25/2022)   SDOH Interventions:     Readmission Risk Interventions    12/26/2022   10:54 AM 09/25/2022   12:42 PM  Readmission Risk Prevention Plan  Transportation Screening Complete Complete  PCP or Specialist Appt within 5-7 Days  Not Complete  Home Care Screening  Complete  Medication Review (RN CM)  Complete  HRI or Home Care Consult Complete   Social Work Consult for Recovery Care Planning/Counseling Complete   Palliative Care Screening Not Applicable   Medication Review Oceanographer) Complete

## 2022-12-26 NOTE — Progress Notes (Signed)
UNMATCHED BLOOD PRODUCT NOTE  Compare the patient ID on the blood tag to the patient ID on the hospital armband and Blood Bank armband. Then confirm the unit number on the blood tag matches the unit number on the blood product.  If a discrepancy is discovered return the product to blood bank immediately.   Blood Product Type: Packed Red Blood Cells  Unit #: (Found on blood product bag, begins with W) [id number]  Product Code #: (Found on blood product bag, begins with E) [id number]   Start Time: 0242  Starting Rate: 120 ml/hr  Rate increase/decreased  (if applicable):    150  ml/hr  Rate changed time (if applicable): 0307   Stop Time: 0500   All Other Documentation should be documented within the Blood Admin Flowsheet per policy.

## 2022-12-26 NOTE — Hospital Course (Addendum)
Leonard Cisneros is a 82 y.o. male with a history of CAD s/p CABG, aortic stenosis, ischemic cardiomyopathy, diastolic heart failure, back pain, atrial flutter s/p ablation, atrial fibrillation, PAD, anemia, hypertension, duodenal ulcer. Patient presented secondary to melena, complicated by Eliquis use, with associated symptomatic anemia requiring blood transfusion. GI and cardiology consulted. Hospitalization complicated by hemorrhagic shock requiring ICU admission and vasopressor support. Shock resolved with blood replacement. Upper endoscopy significant for a non-bleeding duodenal ulcer and gastritis.

## 2022-12-26 NOTE — Progress Notes (Signed)
PROGRESS NOTE    Leonard Cisneros  WGN:[id number] DOB: [birth year] DOA: 12/25/2022 PCP: [name], FNP   Brief Narrative: Leonard Cisneros is a 82 y.o. male with a history of CAD s/p CABG, aortic stenosis, ischemic cardiomyopathy, diastolic heart failure, back pain, atrial flutter s/p ablation, atrial fibrillation, PAD, anemia, hypertension, duodenal ulcer. Patient presented secondary to melena, complicated by Eliquis use, with associated symptomatic anemia requiring blood transfusion. GI and cardiology consulted. Hospitalization complicated by hemorrhagic shock requiring ICU admission and vasopressor support.   Assessment and Plan:  Acute GI bleed Patient with a history of melena and severe anemia. Concern for upper GI bleeding. Complicated by Eliquis use. Patient with history of duodenal ulcers diagnosed on recent EGD in 11/2022. Protonix IV BID started. Eliquis held. Gastroenterology consulted.   Acute blood loss anemia Symptomatic anemia Chronic anemia [name] baseline hemoglobin of between 8-9. Hemoglobin of 5.0 on admission secondary to acute GI bleeding. Associated dyspnea, chest pain, fatigue. 3 units of PRBCs ordered for transfusion. After 3 units, post-transfusion hemoglobin of 7.7. -Serial H&H/CBC; if hemoglobin remains less than 8, will transfuse one more unit of PRBC -Transfusion threshold is <8  Hemorrhagic shock Secondary to acute GI bleeding and resultant blood loss. Levophed IV started peripherally on 4/24. Improved with fluid and blood resuscitation. -Wean off Levophed  Acute myocardial injury Chest pain Troponin elevation of 216 > 220. Cardiology consulted. Injury in setting of acute blood loss. Chest pain resolved with completion of blood transfusions.  Permanent atrial fibrillation Patient is managed on Eliquis and Toprol XL as an outpatient. Eliquis held on admission secondary to GI bleeding. Metoprolol continued. -Hold metoprolol this morning -Restart  Eliquis pending GI recommendations  Pulmonary edema Noted on imaging. Patient is asymptomatic and is on room air. -Monitor for symptoms  CAD Stable angina History of CABG and bare metal stent to SVG-RI. Patient is managed on Imdur and metoprolol as an outpatient.   Chronic HFpEF Last LVEF of 55-60% from Transthoracic Echocardiogram in 09/2022.  Elevated AST/ALP Hyperbilirubinemia Unclear etiology. RUQ ultrasound obtained and significant for cholelithiasis without evidence of acute cholecystitis.  Moderate-severe aortic stenosis Noted.     DVT prophylaxis: SCDs Code Status:   Code Status: Full Code Family Communication: None at bedside Disposition Plan: Discharge home pending continued GI workup/management. Anticipate discharge in 2-4 days.   Consultants:  Gastroenterology Cardiology  Procedures:  None  Antimicrobials: None    Subjective: Patient feels much better today. No dyspnea or chest pain this morning.  Objective: BP (!) 95/51   Pulse (!) 104   Temp 98.6 F (37 C)   Resp 20   Ht  (1.803 m)   Wt 88.5 kg   SpO2 93%   BMI 27.21 kg/m   Examination:  General exam: Appears calm and comfortable Respiratory system: Clear to auscultation. Respiratory effort normal. Cardiovascular system: S1 & S2 heard. Irregular rhythm. Normal rate. 2/6 systolic murmur. Gastrointestinal system: Abdomen is nondistended, soft and nontender. No organomegaly or masses felt. Normal bowel sounds heard. Central nervous system: Alert and oriented. No focal neurological deficits. Musculoskeletal: No edema. No calf tenderness Skin: No cyanosis. No rashes Psychiatry: Judgement and insight appear normal. Mood & affect appropriate.    Data Reviewed: I have personally reviewed following labs and imaging studies  CBC Lab Results  Component Value Date   WBC 10.8 (H) 12/26/2022   RBC 2.22 (L) 12/26/2022   HGB 7.7 (L) 12/26/2022   HCT 23.6 (L) 12/26/2022   MCV 106.3 (  H)  12/26/2022   MCH 34.7 (H) 12/26/2022   PLT 191 12/26/2022   MCHC 32.6 12/26/2022   RDW 26.0 (H) 12/26/2022   LYMPHSABS 1.4 11/29/2022   MONOABS 0.7 11/18/2022   EOSABS 0.3 11/29/2022   BASOSABS 0.0 11/29/2022     Last metabolic panel Lab Results  Component Value Date   NA 134 (L) 12/26/2022   K 4.3 12/26/2022   CL 101 12/26/2022   CO2 25 12/26/2022   BUN 34 (H) 12/26/2022   CREATININE 1.30 (H) 12/26/2022   GLUCOSE 125 (H) 12/26/2022   GFRNONAA 55 (L) 12/26/2022   GFRAA 97 07/19/2020   CALCIUM 8.4 (L) 12/26/2022   PHOS 4.2 12/26/2022   PROT 5.8 (L) 12/26/2022   ALBUMIN 3.0 (L) 12/26/2022   LABGLOB 2.6 11/29/2022   AGRATIO 1.4 11/29/2022   BILITOT 4.8 (H) 12/26/2022   ALKPHOS 251 (H) 12/26/2022   AST 47 (H) 12/26/2022   ALT 44 12/26/2022   ANIONGAP 8 12/26/2022    GFR: Estimated Creatinine Clearance: 47.5 mL/min (A) (by C-G formula based on SCr of 1.3 mg/dL (H)).  Recent Results (from the past 240 hour(s))  MRSA Next Gen by PCR, Nasal     Status: None   Collection Time: 12/25/22  6:50 PM   Specimen: Nasal Mucosa; Nasal Swab  Result Value Ref Range Status   MRSA by PCR Next Gen NOT DETECTED NOT DETECTED Final    Comment: (NOTE) The GeneXpert MRSA Assay (FDA approved for NASAL specimens only), is one component of a comprehensive MRSA colonization surveillance program. It is not intended to diagnose MRSA infection nor to guide or monitor treatment for MRSA infections. Test performance is not FDA approved in patients less than 18 years old. Performed at [facility], [address] [place], [place] [address]       Radiology Studies: US Abdomen Limited RUQ (LIVER/GB)  Result Date: 12/25/2022 CLINICAL DATA:  Elevated bilirubin. Elevated liver function studies. EXAM: ULTRASOUND ABDOMEN LIMITED RIGHT UPPER QUADRANT COMPARISON:  09/22/2022 FINDINGS: Gallbladder: Cholelithiasis with multiple stones demonstrated in the gallbladder, largest measuring about 5 mm in  diameter. No gallbladder wall thickening, edema, or sludge. Murphy's sign is negative. Common bile duct: Diameter: Common bile duct is not visualized. No intrahepatic bile ductal dilatation. Liver: No focal lesion identified. Within normal limits in parenchymal echogenicity. Portal vein is patent on color Doppler imaging with normal direction of blood flow towards the liver. Other: A moderate right pleural effusion is incidentally visualized. IMPRESSION: 1. Cholelithiasis without additional changes to suggest acute cholecystitis. 2. Right pleural effusion. 3. Similar appearance to previous study. Electronically Signed   By: Burman Nieves M.D.   On: 12/25/2022 17:17   DG Chest 2 View  Result Date: 12/25/2022 CLINICAL DATA:  Chest pain. EXAM: CHEST - 2 VIEW COMPARISON:  Chest x-ray May 18 24. FINDINGS: Enlarged cardiac silhouette. Pulmonary vascular gesture. Mild diffuse interstitial prominence. Small bilateral pleural effusions. Overlying bibasilar opacities. No visible pneumothorax. Polyarticular degenerative change. CABG and median sternotomy. IMPRESSION: Cardiomegaly, pulmonary vascular congestion, bilateral pleural effusions and mild interstitial prominence, suggestive of pulmonary edema. Superimposed pneumonia at the lung bases is not excluded. Electronically Signed   By: Feliberto Harts M.D.   On: 12/25/2022 09:48      LOS: 1 day    Jacquelin Hawking, MD [facility] 12/26/2022, 7:18 AM   If 7PM-7AM, please contact night-coverage [email or web address]

## 2022-12-26 NOTE — Progress Notes (Signed)
Subjective: Patient sitting up in chair, remains on low dose levophed (BP 98/60). No overt GI bleeding per nursing staff. Patient states he is feeling much better. Wife at bedside. No abdominal pain, nausea or vomiting. He is thirsty.    Objective: Vital signs in last 24 hours: Temp:  [96.6 F (35.9 C)-98.8 F (37.1 C)] 98 F (36.7 C) (04/24 0808) Pulse Rate:  [46-132] 104 (04/24 0730) Resp:  [17-27] 19 (04/24 0734) BP: (77-112)/(34-65) 98/51 (04/24 0730) SpO2:  [88 %-100 %] 96 % (04/24 0730) Weight:  [88.3 kg-88.5 kg] 88.5 kg (04/24 0450) Last BM Date : 12/25/22 General:   Alert and oriented, pleasant Head:  Normocephalic and atraumatic. Eyes:  No icterus, sclera clear. Conjuctiva pink.  Mouth:  Without lesions, mucosa pink and moist.  Neck:  Supple, without thyromegaly or masses.  Heart:  S1, S2 present, no murmurs noted.  Lungs: Clear to auscultation bilaterally, without wheezing, rales, or rhonchi.  Abdomen:  Bowel sounds present, soft, non-tender, non-distended. No HSM or hernias noted. No rebound or guarding. No masses appreciated  Msk:  Symmetrical without gross deformities. Normal posture. Pulses:  Normal pulses noted. Extremities:  Without clubbing or edema. Neurologic:  Alert and  oriented x4;  grossly normal neurologically. Skin:  Warm and dry, intact without significant lesions.  Psych:  Alert and cooperative. Normal mood and affect.  Intake/Output from previous day: 04/23 0701 - 04/24 0700 In: 848.8 [P.O.:30; I.V.:188.8] Out: 650 [Urine:650] Intake/Output this shift: Total I/O In: 178.4 [I.V.:178.4] Out: -   Lab Results: Recent Labs    12/25/22 0917 12/25/22 1028 12/26/22 0631  WBC 9.4  --  10.8*  HGB 5.0* 5.1* 7.7*  HCT 16.2* 15.0* 23.6*  PLT 197  --  191   BMET Recent Labs    12/25/22 0919 12/25/22 1028 12/26/22 0630  NA 130* 133* 134*  K 4.3 4.4 4.3  CL 98 97* 101  CO2 20*  --  25  GLUCOSE 154* 151* 125*  BUN 35* 35* 34*  CREATININE  1.32* 1.50* 1.30*  CALCIUM 8.3*  --  8.4*   LFT Recent Labs    12/25/22 0919 12/25/22 1054 12/26/22 0630  PROT 5.9*  --  5.8*  ALBUMIN 3.1*  --  3.0*  AST 49*  --  47*  ALT 43  --  44  ALKPHOS 245*  --  251*  BILITOT 3.3* 3.2* 4.8*  BILIDIR  --  1.0*  --   IBILI  --  2.2*  --    PT/INR Recent Labs    12/26/22 0631  LABPROT 21.6*  INR 1.9*   Studies/Results: US Abdomen Limited RUQ (LIVER/GB)  Result Date: 12/25/2022 CLINICAL DATA:  Elevated bilirubin. Elevated liver function studies. EXAM: ULTRASOUND ABDOMEN LIMITED RIGHT UPPER QUADRANT COMPARISON:  09/22/2022 FINDINGS: Gallbladder: Cholelithiasis with multiple stones demonstrated in the gallbladder, largest measuring about 5 mm in diameter. No gallbladder wall thickening, edema, or sludge. Murphy's sign is negative. Common bile duct: Diameter: Common bile duct is not visualized. No intrahepatic bile ductal dilatation. Liver: No focal lesion identified. Within normal limits in parenchymal echogenicity. Portal vein is patent on color Doppler imaging with normal direction of blood flow towards the liver. Other: A moderate right pleural effusion is incidentally visualized. IMPRESSION: 1. Cholelithiasis without additional changes to suggest acute cholecystitis. 2. Right pleural effusion. 3. Similar appearance to previous study. Electronically Signed   By: Burman Nieves M.D.   On: 12/25/2022 17:17   DG Chest 2 View  Result Date:  12/25/2022 CLINICAL DATA:  Chest pain. EXAM: CHEST - 2 VIEW COMPARISON:  Chest x-ray May 18 24. FINDINGS: Enlarged cardiac silhouette. Pulmonary vascular gesture. Mild diffuse interstitial prominence. Small bilateral pleural effusions. Overlying bibasilar opacities. No visible pneumothorax. Polyarticular degenerative change. CABG and median sternotomy. IMPRESSION: Cardiomegaly, pulmonary vascular congestion, bilateral pleural effusions and mild interstitial prominence, suggestive of pulmonary edema. Superimposed  pneumonia at the lung bases is not excluded. Electronically Signed   By: Feliberto Harts M.D.   On: 12/25/2022 09:48    Assessment: 82 y.o.male with history of CAD s/p CABG, ischemic cardiomyopathy, atrial fibrillation on Eliquis, HTN, HLD, moderate to severe aortic stenosis, duodenal ulcer noted on EGD in March 2024, who presented to the ED Tuesday morning with c/o left chest pain. found to be hypotensive, intermittently tachycardic in the ED, hemoglobin 5.0, FOBT positive. creatinine elevated at 1.32, BNP 658, lactic acid 3.5, troponins elevated. GI consulted for anemia, heme positive stool.   Acute on chronic anemia and heme positive stool: EGD in march with non bleeding duodenal ulcer, reports compliance with PPI BID at home, stopped carafate 1 week ago. Melena had resolved but returned over the last few days. C/o dull pain in upper abdomen. No NSAID use. Chronically on Eliquis, last dose Tuesday morning.   Bleeding likely secondary to known duodenal ulcer in setting of ACs. Should continue with PPI BID, carafate 1g TID.  Will need repeat EGD once medically stable/48 hours after last Eliquis dose. As troponins have been elevated, will require cardiac clearance prior to endo procedures. He remains on low dose levophed for hypotension. He is feeling better and denies any abdominal pain, nausea or vomiting. No GI bleeding per nursing staff. I discussed plan of proceeding with EGD once BP is stable and cleared from cardiac standpoint with the patient and his wife. Will do clear liquids in the meantime as earliest EGD will be is tomorrow.   Elevated Bilirubin and Alkaline phosphatase: persistently elevated since March 2023. Aminotransferases also became elevated on 11/29/2022 with AST 92, ALT 73, alk phos 512, total bilirubin 2.2. Max T. bili 3.8 in January 2024. Abdominal ultrasound in January with cholelithiasis w/o cholecystitis or biliary duct dilation, probable fatty infiltration of the liver. Today, T  bili 4.8,  alk phos 251, AST 47, ALT 44.  Fractionated bili yesterday with T bili 3.2 direct 1, indirect 2.2.   RUQ [place] yesterday with cholelithiasis, largest at 5mm, no cholecystitis, CBD not visualized, no intrahepatic bile ductal dilatation,  similar appearance to previous study. Etiology unclear at this time, could be underlying congestive hepatopathy. Consider further liver workup as outpatient.   Plan: Trend h&h, transfuse as needed IV PPI BID Carafate 1g TID EGD once medically stable Continue to hold eliquis Appreciate cards input Continue supportive measures Clear liquid diet today, NPO midnight    LOS: 1 day    12/26/2022, 9:27 AM   Mishayla Sliwinski L. Jeanmarie Hubert, MSN, APRN, AGNP-C Adult-Gerontology Nurse Practitioner [facility] at [facility]

## 2022-12-26 NOTE — Progress Notes (Signed)
Progress Note  Patient Name: Leonard Cisneros Date of Encounter: 12/26/2022  Primary Cardiologist: [name], MD  Subjective   S/p 2 units of PRBC transfusion. Hemoglobin 7.7 today. Patient denied any chest pain since arrival to the ER.  No symptoms, he did not get good sleep last night.  Inpatient Medications    Scheduled Meds:  atorvastatin  80 mg Oral Daily   Chlorhexidine Gluconate Cloth  6 each Topical Daily   dextromethorphan-guaiFENesin  1 tablet Oral BID   metoprolol succinate  25 mg Oral Daily   pantoprazole (PROTONIX) IV  40 mg Intravenous Q12H   sodium chloride flush  3 mL Intravenous Q12H   sodium chloride flush  3 mL Intravenous Q12H   sucralfate  1 g Oral TID WC & HS   Continuous Infusions:  sodium chloride     sodium chloride 20 mL/hr at 12/26/22 0829   sodium chloride 10 mL/hr at 12/26/22 0307   norepinephrine (LEVOPHED) Adult infusion 4 mcg/min (12/26/22 0829)   PRN Meds: sodium chloride, acetaminophen **OR** acetaminophen, bisacodyl, ondansetron **OR** ondansetron (ZOFRAN) IV, polyethylene glycol, sodium chloride flush, traZODone   Vital Signs    Vitals:   12/26/22 0709 12/26/22 0730 12/26/22 0734 12/26/22 0808  BP: (!) 95/51 (!) 98/51    Pulse: (!) 104 (!) 104    Resp: 20  19   Temp:    98 F (36.7 C)  TempSrc:    Oral  SpO2: 93% 96%    Weight:      Height:        Intake/Output Summary (Last 24 hours) at 12/26/2022 0944 Last data filed at 12/26/2022 0829 Gross per 24 hour  Intake 1027.21 ml  Output 650 ml  Net 377.21 ml   Filed Weights   12/25/22 1947 12/26/22 0450  Weight: 88.3 kg 88.5 kg    Telemetry  Personally reviewed, atrial fibrillation with RVR,80-100s  ECG   Not performed today  Physical Exam   GEN: No acute distress.   Neck: No JVD. Cardiac: Irregular rate and rhythm, no murmur, rub, or gallop.  Respiratory: Nonlabored. Clear to auscultation bilaterally. GI: Soft, nontender, bowel sounds present. MS: No  edema; No deformity. Neuro:  Nonfocal. Psych: Alert and oriented x 3. Normal affect.  Labs    Chemistry Recent Labs  Lab 12/25/22 0919 12/25/22 1028 12/25/22 1054 12/26/22 0630  NA 130* 133*  --  134*  K 4.3 4.4  --  4.3  CL 98 97*  --  101  CO2 20*  --   --  25  GLUCOSE 154* 151*  --  125*  BUN 35* 35*  --  34*  CREATININE 1.32* 1.50*  --  1.30*  CALCIUM 8.3*  --   --  8.4*  PROT 5.9*  --   --  5.8*  ALBUMIN 3.1*  --   --  3.0*  AST 49*  --   --  47*  ALT 43  --   --  44  ALKPHOS 245*  --   --  251*  BILITOT 3.3*  --  3.2* 4.8*  GFRNONAA 54*  --   --  55*  ANIONGAP 12  --   --  8     Hematology Recent Labs  Lab 12/25/22 0917 12/25/22 1028 12/26/22 0631  WBC 9.4  --  10.8*  RBC 1.35*  --  2.22*  HGB 5.0* 5.1* 7.7*  HCT 16.2* 15.0* 23.6*  MCV 120.0*  --  106.3[facility]  37.0*  --  34.7*  MCHC 30.9  --  32.6  RDW 26.8*  --  26.0*  PLT 197  --  191    Cardiac Enzymes Recent Labs  Lab 12/25/22 0917 12/25/22 1054  TROPONINIHS 216* 220*    BNP Recent Labs  Lab 12/25/22 1016  BNP 658.0*     DDimerNo results for input(s): "DDIMER" in the last 168 hours.   Radiology    US Abdomen Limited RUQ (LIVER/GB)  Result Date: 12/25/2022 CLINICAL DATA:  Elevated bilirubin. Elevated liver function studies. EXAM: ULTRASOUND ABDOMEN LIMITED RIGHT UPPER QUADRANT COMPARISON:  09/22/2022 FINDINGS: Gallbladder: Cholelithiasis with multiple stones demonstrated in the gallbladder, largest measuring about 5 mm in diameter. No gallbladder wall thickening, edema, or sludge. Murphy's sign is negative. Common bile duct: Diameter: Common bile duct is not visualized. No intrahepatic bile ductal dilatation. Liver: No focal lesion identified. Within normal limits in parenchymal echogenicity. Portal vein is patent on color Doppler imaging with normal direction of blood flow towards the liver. Other: A moderate right pleural effusion is incidentally visualized. IMPRESSION: 1. Cholelithiasis  without additional changes to suggest acute cholecystitis. 2. Right pleural effusion. 3. Similar appearance to previous study. Electronically Signed   By: Burman Nieves M.D.   On: 12/25/2022 17:17   DG Chest 2 View  Result Date: 12/25/2022 CLINICAL DATA:  Chest pain. EXAM: CHEST - 2 VIEW COMPARISON:  Chest x-ray May 18 24. FINDINGS: Enlarged cardiac silhouette. Pulmonary vascular gesture. Mild diffuse interstitial prominence. Small bilateral pleural effusions. Overlying bibasilar opacities. No visible pneumothorax. Polyarticular degenerative change. CABG and median sternotomy. IMPRESSION: Cardiomegaly, pulmonary vascular congestion, bilateral pleural effusions and mild interstitial prominence, suggestive of pulmonary edema. Superimposed pneumonia at the lung bases is not excluded. Electronically Signed   By: Feliberto Harts M.D.   On: 12/25/2022 09:48    Cardiac Studies  Echocardiogram in 09/2022 LVEF 55 to 60% Moderate LVH Indeterminate LV diastology RV systolic function is normal LA severely dilated RA mildly dilated L pleural effusion Mild MR Moderate to severe aortic valve stenosis (mean PG 24 mmHg, DVI 0.34)  Assessment & Plan   Patient is 82 year old M known to have CAD s/p CABG in 2003, s/p BMS to SVG to RI in 2017, permanent A-fib, mild to moderate aortic valve stenosis in 09/2022, HTN, HLD is currently admitted to the hospitalist team for the management of acute blood loss anemia secondary to upper GI bleed.  # Hypovolemic shock secondary to acute blood loss anemia: Currently norepinephrine drip, per primary team.  # Acute blood loss anemia secondary to upper GI bleeding from likely duodenal ulcer: Hb 5.1>>7.7 after PRBC transfusion today. Keep hemoglobin more than 8 due to CAD status post CABG history. Hold off on all anticoagulants and antiplatelets.  Follow-up GI recommendations.   # CAD s/p CABG in 2003, s/p BMS to SVG-RI in 2017 with normal LVEF, currently chest  pain-free -Hold off on Imdur and metoprolol as long as he is on norepinephrine drip -Continue atorvastatin 80 mg nightly   # Acute myocardial injury with no evidence of myocardial infarction -EKG showed atrial fibrillation with RVR, HR 108 bpm, ST changes in the lateral leads, V5 V6. Hs troponins 220 x 2.  This likely secondary to demand ischemia from acute blood loss anemia.  No indication of ACS protocol.   # Permanent A-fib with RVR likely secondary to acute blood loss anemia -Treat underlying etiology, keep hemoglobin more than 8.  Hold metoprolol succinate as long as patient is  on norepinephrine drip. -Hold Eliquis. Outpatient EP evaluation for watchman candidacy.   # Lactic acidosis secondary to acute blood loss anemia, resolved: Management per primary team, keep hemoglobin more than 8  # Moderate to severe aortic valve stenosis: Outpatient surveillance  # Abnormal LFTs: Follow-up with GI   CRITICAL CARE Performed by: Orion Modest Dhruva Orndoff   Total critical care time: 40 minutes  Critical care time was exclusive of separately billable procedures and treating other patients.  Critical care was necessary to treat or prevent imminent or life-threatening deterioration.  Critical care was time spent personally by me on the following activities: development of treatment plan with patient and/or surrogate as well as nursing, discussions with consultants, evaluation of patient's response to treatment, examination of patient, obtaining history from patient or surrogate, ordering and performing treatments and interventions, ordering and review of laboratory studies, ordering and review of radiographic studies, pulse oximetry and re-evaluation of patient's condition.    Signed, Marjo Bicker, MD  12/26/2022, 9:44 AM

## 2022-12-27 ENCOUNTER — Encounter (HOSPITAL_COMMUNITY)
Admission: EM | Disposition: A | Discharge status: Home Health | Payer: Self-pay | Source: Home / Self Care | Attending: Family Medicine

## 2022-12-27 ENCOUNTER — Other Ambulatory Visit: Payer: Self-pay

## 2022-12-27 ENCOUNTER — Inpatient Hospital Stay (HOSPITAL_COMMUNITY): Payer: Medicare HMO | Admitting: Anesthesiology

## 2022-12-27 ENCOUNTER — Encounter (HOSPITAL_COMMUNITY): Payer: Self-pay | Admitting: Family Medicine

## 2022-12-27 DIAGNOSIS — Z5309 Procedure and treatment not carried out because of other contraindication: Secondary | ICD-10-CM | POA: Diagnosis not present

## 2022-12-27 DIAGNOSIS — K922 Gastrointestinal hemorrhage, unspecified: Secondary | ICD-10-CM | POA: Diagnosis not present

## 2022-12-27 DIAGNOSIS — D62 Acute posthemorrhagic anemia: Secondary | ICD-10-CM | POA: Diagnosis not present

## 2022-12-27 DIAGNOSIS — R748 Abnormal levels of other serum enzymes: Secondary | ICD-10-CM

## 2022-12-27 DIAGNOSIS — K222 Esophageal obstruction: Secondary | ICD-10-CM

## 2022-12-27 DIAGNOSIS — I4821 Permanent atrial fibrillation: Secondary | ICD-10-CM

## 2022-12-27 DIAGNOSIS — Z87891 Personal history of nicotine dependence: Secondary | ICD-10-CM

## 2022-12-27 DIAGNOSIS — R571 Hypovolemic shock: Secondary | ICD-10-CM | POA: Diagnosis not present

## 2022-12-27 DIAGNOSIS — R578 Other shock: Secondary | ICD-10-CM | POA: Diagnosis not present

## 2022-12-27 DIAGNOSIS — D649 Anemia, unspecified: Secondary | ICD-10-CM

## 2022-12-27 DIAGNOSIS — K269 Duodenal ulcer, unspecified as acute or chronic, without hemorrhage or perforation: Secondary | ICD-10-CM

## 2022-12-27 DIAGNOSIS — I4891 Unspecified atrial fibrillation: Secondary | ICD-10-CM

## 2022-12-27 DIAGNOSIS — K2971 Gastritis, unspecified, with bleeding: Secondary | ICD-10-CM | POA: Diagnosis not present

## 2022-12-27 HISTORY — PX: ESOPHAGOGASTRODUODENOSCOPY (EGD) WITH PROPOFOL: SHX5813

## 2022-12-27 LAB — GLUCOSE, CAPILLARY
Glucose-Capillary: 101 mg/dL — ABNORMAL HIGH (ref 70–99)
Glucose-Capillary: 97 mg/dL (ref 70–99)
Glucose-Capillary: 107 mg/dL — ABNORMAL HIGH (ref 70–99)

## 2022-12-27 LAB — CBC
HCT: 25.3 % — ABNORMAL LOW (ref 39.0–52.0)
Hemoglobin: 8.4 g/dL — ABNORMAL LOW (ref 13.0–17.0)
MCH: 34.7 pg — ABNORMAL HIGH (ref 26.0–34.0)
MCHC: 33.2 g/dL (ref 30.0–36.0)
MCV: 104.5 fL — ABNORMAL HIGH (ref 80.0–100.0)
Platelets: 159 10*3/uL (ref 150–400)
RBC: 2.42 MIL/uL — ABNORMAL LOW (ref 4.22–5.81)
RDW: 25.3 % — ABNORMAL HIGH (ref 11.5–15.5)
WBC: 8.5 10*3/uL (ref 4.0–10.5)
nRBC: 0.4 % — ABNORMAL HIGH (ref 0.0–0.2)

## 2022-12-27 LAB — TYPE AND SCREEN: Unit division: 0

## 2022-12-27 LAB — COMPREHENSIVE METABOLIC PANEL
ALT: 41 U/L (ref 0–44)
AST: 42 U/L — ABNORMAL HIGH (ref 15–41)
Albumin: 2.8 g/dL — ABNORMAL LOW (ref 3.5–5.0)
Alkaline Phosphatase: 226 U/L — ABNORMAL HIGH (ref 38–126)
Anion gap: 7 (ref 5–15)
BUN: 24 mg/dL — ABNORMAL HIGH (ref 8–23)
CO2: 24 mmol/L (ref 22–32)
Calcium: 8.1 mg/dL — ABNORMAL LOW (ref 8.9–10.3)
Chloride: 102 mmol/L (ref 98–111)
Creatinine, Ser: 0.98 mg/dL (ref 0.61–1.24)
GFR, Estimated: >60 mL/min (ref >60)
Glucose, Bld: 111 mg/dL — ABNORMAL HIGH (ref 70–99)
Potassium: 3.5 mmol/L (ref 3.5–5.1)
Sodium: 133 mmol/L — ABNORMAL LOW (ref 135–145)
Total Bilirubin: 4.3 mg/dL — ABNORMAL HIGH (ref 0.3–1.2)
Total Protein: 5.6 g/dL — ABNORMAL LOW (ref 6.5–8.1)

## 2022-12-27 LAB — BPAM RBC
Blood Product Expiration Date: 202405262359
ISSUE DATE / TIME: 202404232251
Unit Type and Rh: 5100

## 2022-12-27 SURGERY — ESOPHAGOGASTRODUODENOSCOPY (EGD) WITH PROPOFOL
Anesthesia: General

## 2022-12-27 MED ORDER — LIDOCAINE VISCOUS HCL 2 % MT SOLN
15.0000 mL | Freq: Once | OROMUCOSAL | Status: AC
  Administered 2022-12-27: 15 mL via OROMUCOSAL

## 2022-12-27 MED ORDER — LACTATED RINGERS IV SOLN: INTRAVENOUS | Status: DC

## 2022-12-27 MED ORDER — LIDOCAINE VISCOUS HCL 2 % MT SOLN
OROMUCOSAL | Status: AC
  Filled 2022-12-27: qty 15

## 2022-12-27 MED ORDER — LIDOCAINE HCL (CARDIAC) PF 100 MG/5ML IV SOSY
PREFILLED_SYRINGE | INTRAVENOUS | Status: DC | PRN
  Administered 2022-12-27: 50 mg via INTRAVENOUS

## 2022-12-27 MED ORDER — APIXABAN 5 MG PO TABS
5.0000 mg | ORAL_TABLET | Freq: Two times a day (BID) | ORAL | Status: DC
  Filled 2022-12-27 (×2): qty 1

## 2022-12-27 MED ORDER — SODIUM CHLORIDE 0.9 % IV SOLN: INTRAVENOUS | Status: DC

## 2022-12-27 MED ORDER — PROPOFOL 10 MG/ML IV BOLUS
INTRAVENOUS | Status: DC | PRN
  Administered 2022-12-27: 30 mg via INTRAVENOUS
  Administered 2022-12-27: 70 mg via INTRAVENOUS

## 2022-12-27 NOTE — Op Note (Signed)
[facility] Patient Name: Leonard Cisneros Procedure Date: 12/27/2022 2:42 PM MRN: [id number] Date of Birth: 01-Jun-1941 Attending MD: Hennie Duos. Marletta Lor , [place], [id number] CSN: [id number] Age: 82 Admit Type: Outpatient Procedure:                Upper GI endoscopy Indications:              Acute post hemorrhagic anemia, Melena Providers:                Hennie Duos. Marletta Lor, DO, Buel Ream. Museum/gallery exhibitions officer, Charity fundraiser,                            Judeth Cornfield. Jessee Avers, Technician Referring MD:              Medicines:                See the Anesthesia note for documentation of the                            administered medications Complications:            No immediate complications. Estimated Blood Loss:     Estimated blood loss: none. Procedure:                Pre-Anesthesia Assessment:                           - The anesthesia plan was to use monitored                            anesthesia care (MAC).                           After obtaining informed consent, the endoscope was                            passed under direct vision. Throughout the                            procedure, the patient's blood pressure, pulse, and                            oxygen saturations were monitored continuously. The                            [id number]-H190 ([id number]) scope was introduced through the                            mouth, and advanced to the second part of duodenum.                            The upper GI endoscopy was accomplished without                            difficulty. The patient tolerated the procedure  well. Scope In: 2:49:25 PM Scope Out: 2:54:57 PM Total Procedure Duration: 0 hours 5 minutes 32 seconds  Findings:      A mild Schatzki ring was found in the distal esophagus.      Localized moderate inflammation characterized by erosions and erythema       was found in the gastric antrum.      One non-bleeding cratered duodenal ulcer with a clean ulcer base        (Forrest Class III) was found in the first portion of the duodenum. The       lesion was 5 mm in largest dimension. Improved compared to prior. Impression:               - Mild Schatzki ring.                           - Gastritis.                           - Non-bleeding duodenal ulcer with a clean ulcer                            base (Forrest Class III).                           - No specimens collected. Moderate Sedation:      Per Anesthesia Care Recommendation:           - Return patient to hospital ward for ongoing care.                           - Soft diet.                           - Continue on IV PPI BID while inpatient. DC on PO                            PPI 40 mg BID. Restart Eliquis tomorrow. Procedure Code(s):        --- Professional ---                           [phone], Esophagogastroduodenoscopy, flexible,                            transoral; diagnostic, including collection of                            specimen(s) by brushing or washing, when performed                            (separate procedure) Diagnosis Code(s):        --- Professional ---                           K22.2, Esophageal obstruction                           K29.70, Gastritis, unspecified, without bleeding  K26.9, Duodenal ulcer, unspecified as acute or                            chronic, without hemorrhage or perforation                           D62, Acute posthemorrhagic anemia                           K92.1, Melena (includes Hematochezia) CPT copyright 2022 [employer]. All rights reserved. The codes documented in this report are preliminary and upon coder review may  be revised to meet current compliance requirements. Hennie Duos. Marletta Lor, DO Hennie Duos. Marletta Lor, DO 12/27/2022 3:00:09 PM This report has been signed electronically. Number of Addenda: 0

## 2022-12-27 NOTE — Anesthesia Preprocedure Evaluation (Signed)
Anesthesia Evaluation  Patient identified by MRN, date of birth, ID band Patient awake    Reviewed: Allergy & Precautions, H&P , NPO status , [name] Chart, lab work & pertinent test results, reviewed documented beta blocker date and time   Airway Mallampati: II  TM Distance: >3 FB     Dental  (+) Dental Advisory Given, Partial Upper, Missing   Pulmonary neg pulmonary ROS, former smoker   Pulmonary exam normal breath sounds clear to auscultation       Cardiovascular Exercise Tolerance: Good hypertension, Pt. on medications and Pt. on home beta blockers + angina  + CAD, + Cardiac Stents, + CABG, + Peripheral Vascular Disease and +CHF  + dysrhythmias Atrial Fibrillation  Rhythm:Regular Rate:Normal + Systolic murmurs Left Ventricle: Left ventricular ejection fraction, by estimation, is 55  to 60%. The left ventricle has normal function. The left ventricle has no  regional wall motion abnormalities. The left ventricular internal cavity  size was normal in size. There is   moderate concentric left ventricular hypertrophy. Left ventricular  diastolic function could not be evaluated due to atrial fibrillation. Left  ventricular diastolic parameters are indeterminate.   Right Ventricle: The right ventricular size is mildly enlarged. No  increase in right ventricular wall thickness. Right ventricular systolic  function is normal. Tricuspid regurgitation signal is inadequate for  assessing PA pressure.   Left Atrium: Left atrial size was severely dilated.   Right Atrium: Right atrial size was mildly dilated.   Pericardium: Left pleural effusion noted. There is no evidence of  pericardial effusion.   Mitral Valve: The mitral valve is degenerative in appearance. Severe  mitral annular calcification. Mild mitral valve regurgitation. MV peak  gradient, 15.8 mmHg. The mean mitral valve gradient is 4.0 mmHg.   Tricuspid Valve: The  tricuspid valve is grossly normal. Tricuspid valve  regurgitation is mild.   Aortic Valve: The aortic valve is tricuspid. There is moderate  calcification of the aortic valve. There is mild to moderate aortic valve  annular calcification. Aortic valve regurgitation is not visualized.  Moderate to severe aortic stenosis is present.  Aortic valve mean gradient measures 24.0 mmHg. Aortic valve peak gradient  measures 42.1 mmHg. Aortic valve area, by VTI measures 1.06 cm.   Pulmonic Valve: The pulmonic valve was grossly normal. Pulmonic valve  regurgitation is trivial.   Aorta: The aortic root is normal in size and structure and aortic  dilatation noted. There is mild dilatation of the ascending aorta,  measuring 39 mm.   Venous: The inferior vena cava is dilated in size with less than 50%  respiratory variability, suggesting right atrial pressure of 15 mmHg.   IAS/Shunts: No atrial level shunt detected by color flow Doppler.   Additional Comments: There is pleural effusion in the left lateral region.      Neuro/Psych  Neuromuscular disease  negative psych ROS   GI/Hepatic Neg liver ROS, PUD,GERD  Medicated,,  Endo/Other  negative endocrine ROS    Renal/GU negative Renal ROS  negative genitourinary   Musculoskeletal negative musculoskeletal ROS (+)    Abdominal   Peds negative pediatric ROS (+)  Hematology  (+) Blood dyscrasia, anemia   Anesthesia Other Findings   Reproductive/Obstetrics negative OB ROS                             Anesthesia Physical Anesthesia Plan  ASA: 3  Anesthesia Plan: General   Post-op Pain Management: Minimal  or no pain anticipated   Induction: Intravenous  PONV Risk Score and Plan: Propofol infusion  Airway Management Planned: Nasal Cannula and Natural Airway  Additional Equipment:   Intra-op Plan:   Post-operative Plan:   Informed Consent: I have reviewed the patients History and Physical, chart,  labs and discussed the procedure including the risks, benefits and alternatives for the proposed anesthesia with the patient or authorized representative who has indicated his/her understanding and acceptance.     Dental advisory given  Plan Discussed with: Surgeon and CRNA  Anesthesia Plan Comments:         Anesthesia Quick Evaluation

## 2022-12-27 NOTE — Progress Notes (Addendum)
Rounding Note    Patient Name: Leonard Cisneros Date of Encounter: 12/27/2022  [facility] Cardiologist: [name], MD   Subjective   Denies any recurrent chest pain or palpitations. Breathing has improved. Says he did not sleep well overnight. Currently NPO for possible EGD today.   Inpatient Medications    Scheduled Meds:  atorvastatin  80 mg Oral Daily   Chlorhexidine Gluconate Cloth  6 each Topical Daily   dextromethorphan-guaiFENesin  1 tablet Oral BID   pantoprazole (PROTONIX) IV  40 mg Intravenous Q12H   sodium chloride flush  3 mL Intravenous Q12H   sodium chloride flush  3 mL Intravenous Q12H   sucralfate  1 g Oral TID WC & HS   Continuous Infusions:  sodium chloride Stopped (12/27/22 0736)   sodium chloride 20 mL/hr at 12/27/22 0735   sodium chloride 10 mL/hr at 12/26/22 0307   norepinephrine (LEVOPHED) Adult infusion Stopped (12/26/22 2201)   PRN Meds: sodium chloride, acetaminophen **OR** acetaminophen, bisacodyl, ondansetron **OR** ondansetron (ZOFRAN) IV, polyethylene glycol, sodium chloride flush, traZODone   Vital Signs    Vitals:   12/27/22 0700 12/27/22 0715 12/27/22 0730 12/27/22 0800  BP: (!) 112/50 104/60 (!) 94/59 (!) 102/57  Pulse: (!) 123 (!) 117 (!) 101 97  Resp: (!) 22 (!) 23 (!) 22 (!) 21  Temp:    98.3 F (36.8 C)  TempSrc:    Oral  SpO2: 99% 95% 92% 97%  Weight:      Height:        Intake/Output Summary (Last 24 hours) at 12/27/2022 0920 Last data filed at 12/27/2022 0735 Gross per 24 hour  Intake 1033.21 ml  Output 1550 ml  Net -516.79 ml      12/27/2022    5:00 AM 12/26/2022    4:50 AM 12/25/2022    7:47 PM  Last 3 Weights  Weight (lbs) 195 lb 1.7 oz 195 lb 1.7 oz 194 lb 10.7 oz  Weight (kg) 88.5 kg 88.5 kg 88.3 kg      Telemetry    Atrial fibrillation, HR in 80's to 110's with occasional PVC's.  - Personally Reviewed  ECG    No new tracings.   Physical Exam   GEN: Pleasant male appearing in no  acute distress.   Neck: No JVD Cardiac: Irregularly irregular. 2/6 SEM along RUSB.  Respiratory: Clear to auscultation bilaterally. GI: Soft, nontender, non-distended  MS: Trace edema bilaterally; No deformity. Neuro:  Nonfocal  Psych: Normal affect   Labs    High Sensitivity Troponin:   Recent Labs  Lab 12/25/22 0917 12/25/22 1054  TROPONINIHS 216* 220*     Chemistry Recent Labs  Lab 12/25/22 0919 12/25/22 0927 12/25/22 1028 12/25/22 1054 12/26/22 0630 12/27/22 0742  NA 130*  --  133*  --  134* 133*  K 4.3  --  4.4  --  4.3 3.5  CL 98  --  97*  --  101 102  CO2 20*  --   --   --  25 24  GLUCOSE 154*  --  151*  --  125* 111*  BUN 35*  --  35*  --  34* 24*  CREATININE 1.32*  --  1.50*  --  1.30* 0.98  CALCIUM 8.3*  --   --   --  8.4* 8.1*  MG  --  2.0  --   --   --   --   PROT 5.9*  --   --   --  5.8* 5.6*  ALBUMIN 3.1*  --   --   --  3.0* 2.8*  AST 49*  --   --   --  47* 42*  ALT 43  --   --   --  44 41  ALKPHOS 245*  --   --   --  251* 226*  BILITOT 3.3*  --   --  3.2* 4.8* 4.3*  GFRNONAA 54*  --   --   --  55* >60  ANIONGAP 12  --   --   --  8 7    Lipids No results for input(s): "CHOL", "TRIG", "HDL", "LABVLDL", "LDLCALC", "CHOLHDL" in the last 168 hours.  Hematology Recent Labs  Lab 12/26/22 0631 12/26/22 1136 12/26/22 1946 12/27/22 0742  WBC 10.8*  --  11.4* 8.5  RBC 2.22*  --  2.55* 2.42*  HGB 7.7* 7.8* 8.8* 8.4*  HCT 23.6* 23.6* 27.0* 25.3*  MCV 106.3*  --  105.9* 104.5*  MCH 34.7*  --  34.5* 34.7*  MCHC 32.6  --  32.6 33.2  RDW 26.0*  --  25.6* 25.3*  PLT 191  --  180 159   Thyroid No results for input(s): "TSH", "FREET4" in the last 168 hours.  BNP Recent Labs  Lab 12/25/22 1016  BNP 658.0*    DDimer No results for input(s): "DDIMER" in the last 168 hours.   Radiology    US Abdomen Limited RUQ (LIVER/GB)  Result Date: 12/25/2022 CLINICAL DATA:  Elevated bilirubin. Elevated liver function studies. EXAM: ULTRASOUND ABDOMEN LIMITED  RIGHT UPPER QUADRANT COMPARISON:  09/22/2022 FINDINGS: Gallbladder: Cholelithiasis with multiple stones demonstrated in the gallbladder, largest measuring about 5 mm in diameter. No gallbladder wall thickening, edema, or sludge. Murphy's sign is negative. Common bile duct: Diameter: Common bile duct is not visualized. No intrahepatic bile ductal dilatation. Liver: No focal lesion identified. Within normal limits in parenchymal echogenicity. Portal vein is patent on color Doppler imaging with normal direction of blood flow towards the liver. Other: A moderate right pleural effusion is incidentally visualized. IMPRESSION: 1. Cholelithiasis without additional changes to suggest acute cholecystitis. 2. Right pleural effusion. 3. Similar appearance to previous study. Electronically Signed   By: Burman Nieves M.D.   On: 12/25/2022 17:17   DG Chest 2 View  Result Date: 12/25/2022 CLINICAL DATA:  Chest pain. EXAM: CHEST - 2 VIEW COMPARISON:  Chest x-ray May 18 24. FINDINGS: Enlarged cardiac silhouette. Pulmonary vascular gesture. Mild diffuse interstitial prominence. Small bilateral pleural effusions. Overlying bibasilar opacities. No visible pneumothorax. Polyarticular degenerative change. CABG and median sternotomy. IMPRESSION: Cardiomegaly, pulmonary vascular congestion, bilateral pleural effusions and mild interstitial prominence, suggestive of pulmonary edema. Superimposed pneumonia at the lung bases is not excluded. Electronically Signed   By: Feliberto Harts M.D.   On: 12/25/2022 09:48    Cardiac Studies   Echocardiogram: 09/2022 IMPRESSIONS     1. Left ventricular ejection fraction, by estimation, is 55 to 60%. The  left ventricle has normal function. The left ventricle has no regional  wall motion abnormalities. There is moderate concentric left ventricular  hypertrophy. Left ventricular  diastolic parameters are indeterminate.   2. Right ventricular systolic function is normal. The right  ventricular  size is mildly enlarged. Tricuspid regurgitation signal is inadequate for  assessing PA pressure.   3. Left atrial size was severely dilated.   4. Right atrial size was mildly dilated.   5. Left pleural effusion noted.   6. The mitral valve is degenerative. Mild  mitral valve regurgitation.  Severe mitral annular calcification.   7. The aortic valve is tricuspid. There is moderate calcification of the  aortic valve. Aortic valve regurgitation is not visualized. Moderate to  severe aortic valve stenosis. Aortic valve mean gradient measures 24.0  mmHg. Dimentionless index 0.34.   8. Aortic dilatation noted. There is mild dilatation of the ascending  aorta, measuring 39 mm.   9. The inferior vena cava is dilated in size with <50% respiratory  variability, suggesting right atrial pressure of 15 mmHg.   Comparison(s): Prior images reviewed side by side. LVEF 55-60% range.  Degenerative, calcific aortic stenosis of moderate to severe range with  similar gradients as prior study.   R/LHC: 09/2022   Ost LAD to Prox LAD lesion is 100% stenosed.   Ramus lesion is 100% stenosed.   Prox RCA to Mid RCA lesion is 60% stenosed.   Dist RCA lesion is 90% stenosed.   Origin to Prox Graft lesion is 100% stenosed.   LIMA graft was visualized by angiography and is large.   RIMA graft was visualized by non-selective angiography and is normal in caliber.   The graft exhibits no disease.   The graft exhibits no disease.   LV end diastolic pressure is mildly elevated.   Hemodynamic findings consistent with mild pulmonary hypertension.   There is moderate aortic valve stenosis.   3 vessel occlusive CAD. The native LCx is widely patent LIMA to the LAD is patent RIMA to the PDA is patent SVG to the ramus intermediate is occluded. This appears to be chronic Mild to Moderate aortic stenosis. AV mean gradient 18.5 mm Hg. AVA 2.5 cm squared with index 1.15. Elevated LV filling pressures. LVEDP  20 mm Hg. Mean PCWP 24 mm Hg with large V waves to 42 mm Hg Mild pulmonary HTN. PAP 56/17 with mean 31 mm Hg.    Plan: medical management. Continue diuresis. Would benefit greatly from correction of severe anemia. Will need to monitor Aortic stenosis serially going forward.   Patient Profile     82 y.o. male w/ PMH of CAD (s/p CABG in 2003 with cath in 2017 showing patent LIMA-LAD, patent RIMA-dRCA, and 99% stenosis of SVG-RI treated with BMS, cath in 09/2022 showing patent LIMA-LAD and RIMA-PDA with occluded SVG-RI which appeared to be chronic and med management recommended), permanent atrial fibrillation, aortic stenosis (mild to moderate by cath in 09/2022), HTN and HLD who is currently admitted with an acute GIB and anemia.   Assessment & Plan    1. Anemia/GIB/Hemorrhagic Shock - Hgb was at 5.0 on admission and FOBT was positive. GI following and planning on repeat EGD once medically stable (possibly today by review of notes). Has received 4 units pRBC's this admission and Hgb at 8.4 today. Levophed was able to be stopped yesterday evening and BP stable at 102/57 this morning. Management per GI and the admitting team.   2. Chest Pain/CAD/Elevated Troponin Values - He has an extensive CAD history as described above and recent cath in 09/2022 showed patent LIMA-LAD and RIMA-PDA with occluded SVG-RI which appeared to be chronic and medical management was recommended. - Hs Troponin values were elevated to 216 and 220 this admission which is likely due to demand ischemia in the setting of hemorrhagic shock. No plans for further ischemic testing this admission and has not been on IV Heparin given his GIB. - Not on ASA prior to admission given the use of Eliquis. Remains on Atorvastatin  daily. Toprol-XL  and Imdur currently held given hypotension.   3. Permanent Atrial Fibrillation - He was on Eliquis prior to admission which is currently held given his anemia and GIB. Previously recommended by  Dr. Jenene Slicker to pursue EP consult for consideration of Watchman Device placement. Referral previously placed.  - Toprol-XL held given hypotension. Plan to resume once BP allows (pressors stopped on 4/24 but SBP remains soft in the low-100's).   4. Aortic Stenosis - Was mild to moderate by cath in 09/2022. Continue to follow as an outpatient.   For questions or updates, please contact [facility] Please consult [email or web address] for contact info under      Signed, [name], PA-C  12/27/2022, 9:20 AM     Attending attestation  Patient seen and independently examined with Randall An, PA-C. We discussed all aspects of the encounter. I agree with the assessment and plan as stated above.  No acute events overnight, s/p 4U PRBC transfusion this admission with improvement in the hemoglobin to 8.4. No chest pains. Norepinephrine drip is also weaned off. N.p.o. for EGD today. Telemetry reviewed and has atrial fibrillation with RVR in the range of 90-100s.  Patient examination is remarkable for patient not in acute respite distress, HEENT normal, no JVD, S1-S2 normal, no S3, clear lungs, nondistended and nontender abdomen, no pitting edema in bilateral lower extremities. Recommend to start p.o. metoprolol tartrate 25 mg twice daily when blood pressure is more than 90-100 mm Hg.  Will continue to hold Eliquis on discharge as well.  Structural heart cardiology referral placed to discuss watchman candidacy (DAPT after watchman would be more ideal compared to anticoagulation).  [facility] will sign off.   Medication Recommendations: Resume home medications when blood pressure tolerates Other recommendations (labs, testing, etc): None Follow up as an outpatient: Structural heart cardiology follow-up to discuss watchman candidacy and general cardiology follow-up with APP/MD after structural heart clinic visit.  I have spent a total of 30 minutes with patient reviewing chart ,  telemetry, EKGs, labs and examining patient as well as establishing an assessment and plan that was discussed with the patient.  > 50% of time was spent in direct patient care.    Sedalia Greeson Verne Spurr, MD [place]  [facility]  10:02 AM

## 2022-12-27 NOTE — Anesthesia Postprocedure Evaluation (Signed)
Anesthesia Post Note  Patient: Leonard Cisneros  Procedure(s) Performed: ESOPHAGOGASTRODUODENOSCOPY (EGD) WITH PROPOFOL  Patient location during evaluation: Phase II Anesthesia Type: General Level of consciousness: awake and alert and oriented Pain management: pain level controlled Vital Signs Assessment: post-procedure vital signs reviewed and stable Respiratory status: spontaneous breathing, nonlabored ventilation and respiratory function stable Cardiovascular status: blood pressure returned to baseline and stable Postop Assessment: no apparent nausea or vomiting Anesthetic complications: no  No notable events documented.   Last Vitals:  Vitals:   12/27/22 1600 12/27/22 1630  BP: 91/64   Pulse: (!) 112   Resp: (!) 28   Temp:  (!) 36.3 C  SpO2: (!) 87%     Last Pain:  Vitals:   12/27/22 1700  TempSrc:   PainSc: 0-No pain                 [name] C [name]

## 2022-12-27 NOTE — Interval H&P Note (Signed)
History and Physical Interval Note:  12/27/2022 2:40 PM  Leonard Cisneros  has presented today for surgery, with the diagnosis of melena, duodenal ulcer, acute on chronic anemia.  The various methods of treatment have been discussed with the patient and family. After consideration of risks, benefits and other options for treatment, the patient has consented to  Procedure(s): ESOPHAGOGASTRODUODENOSCOPY (EGD) WITH PROPOFOL (N/A) as a surgical intervention.  The patient's history has been reviewed, patient examined, no change in status, stable for surgery.  I have reviewed the patient's chart and labs.  Questions were answered to the patient's satisfaction.     Lanelle Bal

## 2022-12-27 NOTE — Progress Notes (Signed)
PROGRESS NOTE    Leonard Cisneros  ZOX:[id number] DOB: 24-Sep-1940 DOA: 12/25/2022 PCP: [name], FNP   Brief Narrative: Leonard Cisneros is a 82 y.o. male with a history of CAD s/p CABG, aortic stenosis, ischemic cardiomyopathy, diastolic heart failure, back pain, atrial flutter s/p ablation, atrial fibrillation, PAD, anemia, hypertension, duodenal ulcer. Patient presented secondary to melena, complicated by Eliquis use, with associated symptomatic anemia requiring blood transfusion. GI and cardiology consulted. Hospitalization complicated by hemorrhagic shock requiring ICU admission and vasopressor support.   Assessment and Plan:  Acute GI bleed Patient with a history of melena and severe anemia. Concern for upper GI bleeding. Complicated by Eliquis use. Patient with history of duodenal ulcers diagnosed on recent EGD in 11/2022. Protonix IV BID started. Eliquis held. Gastroenterology consulted. No recurrent bleeding per patient. -GI recommendations: endoscopy planned for today, 4/25 -Continue Protonix IV BID  Acute blood loss anemia Symptomatic anemia Chronic anemia [name] baseline hemoglobin of between 8-9. Hemoglobin of 5.0 on admission secondary to acute GI bleeding. Associated dyspnea, chest pain, fatigue. 3 units of PRBCs ordered for transfusion. After 3 units, post-transfusion hemoglobin of 7.7. Additional 1 unit of PRBC given on 4/24 with final post-transfusion hemoglobin of 8.8. Hemoglobin drifted down to 8.4 this morning. -Serial H&H/CBC; if hemoglobin remains less than 8, will transfuse one more unit of PRBC -Transfusion threshold is <8  Hemorrhagic shock Secondary to acute GI bleeding and resultant blood loss. Levophed IV started peripherally on 4/24. Improved with fluid and blood resuscitation. Levophed weaned off on 4/24. Resolved.  Acute myocardial injury Chest pain Troponin elevation of 216 > 220. Cardiology consulted. Injury in setting of acute blood loss.  Chest pain resolved with completion of blood transfusions.  Permanent atrial fibrillation Patient is managed on Eliquis and Toprol XL as an outpatient. Eliquis held on admission secondary to GI bleeding. Metoprolol continued. -Hold metoprolol this morning -Restart Eliquis pending GI recommendations  Pulmonary edema Noted on imaging. Patient is asymptomatic and is on room air. -Monitor for symptoms  Hyponatremia Mild. Asymptomatic.  CAD Stable angina History of CABG and bare metal stent to SVG-RI. Patient is managed on Imdur and metoprolol as an outpatient.   Chronic HFpEF Last LVEF of 55-60% from Transthoracic Echocardiogram in 09/2022.  Elevated AST/ALP Hyperbilirubinemia Unclear etiology. RUQ ultrasound obtained and significant for cholelithiasis without evidence of acute cholecystitis. Levels stabilized.  Moderate-severe aortic stenosis Noted.    DVT prophylaxis: SCDs Code Status:   Code Status: Full Code Family Communication: None at bedside Disposition Plan: Discharge home pending continued GI workup/management. Anticipate discharge in 2-3 days.   Consultants:  Gastroenterology Cardiology  Procedures:  None  Antimicrobials: None    Subjective: Patient without concerns this morning except that he did not sleep very well.  Objective: BP (!) 102/57 (BP Location: Left Arm)   Pulse 97   Temp 98.3 F (36.8 C) (Oral)   Resp (!) 21   Ht  (1.803 m)   Wt 88.5 kg   SpO2 97%   BMI 27.21 kg/m   Examination:  General exam: Appears calm and comfortable Respiratory system: Clear to auscultation. Respiratory effort normal. Cardiovascular system: S1 & S2 heard, RRR. 2/6 systolic murmur. Gastrointestinal system: Abdomen is nondistended, soft and nontender. Normal bowel sounds heard. Central nervous system: Alert and oriented. No focal neurological deficits. Musculoskeletal: Trace edema. No calf tenderness Skin: No cyanosis. No rashes Psychiatry: Judgement  and insight appear normal. Mood & affect appropriate.    Data Reviewed: I have  personally reviewed following labs and imaging studies  CBC Lab Results  Component Value Date   WBC 8.5 12/27/2022   RBC 2.42 (L) 12/27/2022   HGB 8.4 (L) 12/27/2022   HCT 25.3 (L) 12/27/2022   MCV 104.5 (H) 12/27/2022   MCH 34.7 (H) 12/27/2022   PLT 159 12/27/2022   MCHC 33.2 12/27/2022   RDW 25.3 (H) 12/27/2022   LYMPHSABS 1.4 11/29/2022   MONOABS 0.7 11/18/2022   EOSABS 0.3 11/29/2022   BASOSABS 0.0 11/29/2022     Last metabolic panel Lab Results  Component Value Date   NA 133 (L) 12/27/2022   K 3.5 12/27/2022   CL 102 12/27/2022   CO2 24 12/27/2022   BUN 24 (H) 12/27/2022   CREATININE 0.98 12/27/2022   GLUCOSE 111 (H) 12/27/2022   GFRNONAA >60 12/27/2022   GFRAA 97 07/19/2020   CALCIUM 8.1 (L) 12/27/2022   PHOS 4.2 12/26/2022   PROT 5.6 (L) 12/27/2022   ALBUMIN 2.8 (L) 12/27/2022   LABGLOB 2.6 11/29/2022   AGRATIO 1.4 11/29/2022   BILITOT 4.3 (H) 12/27/2022   ALKPHOS 226 (H) 12/27/2022   AST 42 (H) 12/27/2022   ALT 41 12/27/2022   ANIONGAP 7 12/27/2022    GFR: Estimated Creatinine Clearance: 63 mL/min (by C-G formula based on SCr of 0.98 mg/dL).  Recent Results (from the past 240 hour(s))  MRSA Next Gen by PCR, Nasal     Status: None   Collection Time: 12/25/22  6:50 PM   Specimen: Nasal Mucosa; Nasal Swab  Result Value Ref Range Status   MRSA by PCR Next Gen NOT DETECTED NOT DETECTED Final    Comment: (NOTE) The GeneXpert MRSA Assay (FDA approved for NASAL specimens only), is one component of a comprehensive MRSA colonization surveillance program. It is not intended to diagnose MRSA infection nor to guide or monitor treatment for MRSA infections. Test performance is not FDA approved in patients less than 39 years old. Performed at [facility], [address], [place], [place] [address]       Radiology Studies: US Abdomen Limited RUQ (LIVER/GB)  Result Date:  12/25/2022 CLINICAL DATA:  Elevated bilirubin. Elevated liver function studies. EXAM: ULTRASOUND ABDOMEN LIMITED RIGHT UPPER QUADRANT COMPARISON:  09/22/2022 FINDINGS: Gallbladder: Cholelithiasis with multiple stones demonstrated in the gallbladder, largest measuring about 5 mm in diameter. No gallbladder wall thickening, edema, or sludge. Murphy's sign is negative. Common bile duct: Diameter: Common bile duct is not visualized. No intrahepatic bile ductal dilatation. Liver: No focal lesion identified. Within normal limits in parenchymal echogenicity. Portal vein is patent on color Doppler imaging with normal direction of blood flow towards the liver. Other: A moderate right pleural effusion is incidentally visualized. IMPRESSION: 1. Cholelithiasis without additional changes to suggest acute cholecystitis. 2. Right pleural effusion. 3. Similar appearance to previous study. Electronically Signed   By: Burman Nieves M.D.   On: 12/25/2022 17:17   DG Chest 2 View  Result Date: 12/25/2022 CLINICAL DATA:  Chest pain. EXAM: CHEST - 2 VIEW COMPARISON:  Chest x-ray May 18 24. FINDINGS: Enlarged cardiac silhouette. Pulmonary vascular gesture. Mild diffuse interstitial prominence. Small bilateral pleural effusions. Overlying bibasilar opacities. No visible pneumothorax. Polyarticular degenerative change. CABG and median sternotomy. IMPRESSION: Cardiomegaly, pulmonary vascular congestion, bilateral pleural effusions and mild interstitial prominence, suggestive of pulmonary edema. Superimposed pneumonia at the lung bases is not excluded. Electronically Signed   By: Feliberto Harts M.D.   On: 12/25/2022 09:48      LOS: 2 days  Jacquelin Hawking, MD [facility] 12/27/2022, 8:39 AM   If 7PM-7AM, please contact night-coverage [email or web address]

## 2022-12-27 NOTE — Progress Notes (Signed)
Patient briefly seen. No longer on norepinephrine. BPs low normal. Rate controlled. No SOB, chest pain, abdominal pain or melena. Cardiology has signed off. Stable for EGD.   Leanna Battles. Dixon Boos [facility] [phone] 4/25/202410:19 AM

## 2022-12-27 NOTE — Transfer of Care (Signed)
Immediate Anesthesia Transfer of Care Note  Patient: Leonard Cisneros  Procedure(s) Performed: ESOPHAGOGASTRODUODENOSCOPY (EGD) WITH PROPOFOL  Patient Location: PACU  Anesthesia Type:General  Level of Consciousness: awake  Airway & Oxygen Therapy: Patient Spontanous Breathing and Patient connected to nasal cannula oxygen  Post-op Assessment: Report given to RN and Post -op Vital signs reviewed and stable  Post vital signs: Reviewed and stable  Last Vitals:  Vitals Value Taken Time  BP 79/58   Temp 97.9   Pulse    Resp    SpO2 92%     Last Pain:  Vitals:   12/27/22 1447  TempSrc:   PainSc: 0-No pain      Patients Stated Pain Goal: 0 (12/27/22 0400)  Complications: No notable events documented.

## 2022-12-27 NOTE — Progress Notes (Addendum)
Patient desaturated during sleep to low 80s, sustained for a while, probable sleep apnea, kept on 2 litres [place], satting well now, may need to be evaluated for home oxygen or sleep apnea studies. will continue to monitor and will endorse to the oncoming RN.

## 2022-12-28 ENCOUNTER — Inpatient Hospital Stay (HOSPITAL_COMMUNITY): Payer: Medicare HMO

## 2022-12-28 ENCOUNTER — Encounter (HOSPITAL_COMMUNITY): Payer: Self-pay | Admitting: Family Medicine

## 2022-12-28 DIAGNOSIS — R578 Other shock: Secondary | ICD-10-CM | POA: Diagnosis not present

## 2022-12-28 DIAGNOSIS — R748 Abnormal levels of other serum enzymes: Secondary | ICD-10-CM | POA: Diagnosis not present

## 2022-12-28 DIAGNOSIS — K922 Gastrointestinal hemorrhage, unspecified: Secondary | ICD-10-CM | POA: Diagnosis not present

## 2022-12-28 DIAGNOSIS — D649 Anemia, unspecified: Secondary | ICD-10-CM | POA: Diagnosis not present

## 2022-12-28 LAB — GRAM STAIN

## 2022-12-28 LAB — LACTATE DEHYDROGENASE, PLEURAL OR PERITONEAL FLUID: LD, Fluid: 52 U/L — ABNORMAL HIGH (ref 3–23)

## 2022-12-28 LAB — CBC
HCT: 25.8 % — ABNORMAL LOW (ref 39.0–52.0)
Hemoglobin: 8.4 g/dL — ABNORMAL LOW (ref 13.0–17.0)
MCH: 34.4 pg — ABNORMAL HIGH (ref 26.0–34.0)
MCHC: 32.6 g/dL (ref 30.0–36.0)
MCV: 105.7 fL — ABNORMAL HIGH (ref 80.0–100.0)
Platelets: 156 10*3/uL (ref 150–400)
RBC: 2.44 MIL/uL — ABNORMAL LOW (ref 4.22–5.81)
RDW: 24.2 % — ABNORMAL HIGH (ref 11.5–15.5)
WBC: 8 10*3/uL (ref 4.0–10.5)
nRBC: 0.3 % — ABNORMAL HIGH (ref 0.0–0.2)

## 2022-12-28 LAB — BODY FLUID CELL COUNT WITH DIFFERENTIAL
Eos, Fluid: 0 %
Lymphs, Fluid: 48 %
Monocyte-Macrophage-Serous Fluid: 41 % — ABNORMAL LOW (ref 50–90)
Neutrophil Count, Fluid: 11 % (ref 0–25)
Total Nucleated Cell Count, Fluid: 116 cu mm (ref 0–1000)

## 2022-12-28 LAB — PROTEIN, PLEURAL OR PERITONEAL FLUID: Total protein, fluid: <3 g/dL

## 2022-12-28 LAB — GLUCOSE, CAPILLARY: Glucose-Capillary: 100 mg/dL — ABNORMAL HIGH (ref 70–99)

## 2022-12-28 MED ORDER — FUROSEMIDE 10 MG/ML IJ SOLN
40.0000 mg | Freq: Once | INTRAMUSCULAR | Status: AC
  Administered 2022-12-28: 40 mg via INTRAVENOUS
  Filled 2022-12-28: qty 4

## 2022-12-28 NOTE — Progress Notes (Addendum)
PROGRESS NOTE    Leonard DESIRE  Cisneros:[id number] DOB: 1941-02-07 DOA: 12/25/2022 PCP: [name], FNP   Brief Narrative:  Leonard Cisneros is a 82 y.o. male with a history of CAD s/p CABG, aortic stenosis, ischemic cardiomyopathy, diastolic heart failure, back pain, atrial flutter s/p ablation, atrial fibrillation, PAD, anemia, hypertension, duodenal ulcer. Patient presented secondary to melena, complicated by Eliquis use, with associated symptomatic anemia requiring blood transfusion. GI and cardiology consulted. Hospitalization complicated by hemorrhagic shock requiring ICU admission and vasopressor support. Shock resolved with blood replacement. Upper endoscopy significant for a non-bleeding duodenal ulcer and gastritis.  Assessment and Plan:  Acute GI bleed Patient with a history of melena and severe anemia. Concern for upper GI bleeding. Complicated by Eliquis use. Patient with history of duodenal ulcers diagnosed on recent EGD in 11/2022. Protonix IV BID started. Eliquis held. Gastroenterology consulted. No recurrent bleeding per patient. Upper GI endoscopy performed on 4/25 was significant for a mild Schatzki ring, gastritis and a non-bleeding duodenal ulcer. Gi recommendations for PPI BID on discharge. -Continue Protonix IV BID  Acute blood loss anemia Symptomatic anemia Chronic anemia [name] baseline hemoglobin of between 8-9. Hemoglobin of 5.0 on admission secondary to acute GI bleeding. Associated dyspnea, chest pain, fatigue. 3 units of PRBCs ordered for transfusion. After 3 units, post-transfusion hemoglobin of 7.7. Additional 1 unit of PRBC given on 4/24 with final post-transfusion hemoglobin of 8.8. Hemoglobin drifted down to 8.4 and has stabilized.  Hemorrhagic shock Secondary to acute GI bleeding and resultant blood loss. Levophed IV started peripherally on 4/24. Improved with fluid and blood resuscitation. Levophed weaned off on 4/24. Resolved.  Acute myocardial  injury Chest pain Troponin elevation of 216 > 220. Cardiology consulted. Injury in setting of acute blood loss. Chest pain resolved with completion of blood transfusions.  Permanent atrial fibrillation Patient is managed on Eliquis and Toprol XL as an outpatient. Eliquis held on admission secondary to GI bleeding. Metoprolol continued. Cardiology recommending to discontinue Eliquis.  Pulmonary edema Pleural effusion Noted on imaging. Patient is now symptomatic with hypoxia -Thoracentesis -Lasix 40 mg IV x1  Hypoxia Secondary to pleural effusion and pulmonary edema. -Supplemental oxygen as needed -Management of fluid  Hyponatremia Mild. Asymptomatic.  CAD Stable angina History of CABG and bare metal stent to SVG-RI. Patient is managed on Imdur and metoprolol as an outpatient.   Chronic HFpEF Last LVEF of 55-60% from Transthoracic Echocardiogram in 09/2022.  Elevated AST/ALP Hyperbilirubinemia Unclear etiology. RUQ ultrasound obtained and significant for cholelithiasis without evidence of acute cholecystitis. Levels stabilized.  Moderate-severe aortic stenosis Noted.    DVT prophylaxis: SCDs Code Status:   Code Status: Full Code Family Communication: None at bedside Disposition Plan: Discharge home in 1 day if able to wean off oxygen after management of pleural effusion and pulmonary edema.   Consultants:  Gastroenterology Cardiology  Procedures:  4/25: Upper GI endoscopy  Antimicrobials: None    Subjective: Feels well today. No issues from overnight. Per nursing, patient with hypoxia during ambulation with oxygen saturations down to 85%.  Objective: BP 101/67 (BP Location: Left Arm)   Pulse 100   Temp 97.8 F (36.6 C) (Oral)   Resp 16   Ht 5\' 11"  (1.803 m)   Wt 89.6 kg   SpO2 95%   BMI 27.55 kg/m   Examination:  General exam: Appears calm and comfortable Respiratory system: Diminished. Respiratory effort normal. Cardiovascular system: S1 & S2  heard, RRR. Systolic murmur Gastrointestinal system: Abdomen is nondistended, soft  and nontender. Normal bowel sounds heard. Central nervous system: Alert and oriented. No focal neurological deficits. Musculoskeletal: Mild edema. No calf tenderness Skin: No cyanosis. No rashes  Data Reviewed: I have personally reviewed following labs and imaging studies  CBC Lab Results  Component Value Date   WBC 8.0 12/28/2022   RBC 2.44 (L) 12/28/2022   HGB 8.4 (L) 12/28/2022   HCT 25.8 (L) 12/28/2022   MCV 105.7 (H) 12/28/2022   MCH 34.4 (H) 12/28/2022   PLT 156 12/28/2022   MCHC 32.6 12/28/2022   RDW 24.2 (H) 12/28/2022   LYMPHSABS 1.4 11/29/2022   MONOABS 0.7 11/18/2022   EOSABS 0.3 11/29/2022   BASOSABS 0.0 11/29/2022     Last metabolic panel Lab Results  Component Value Date   NA 133 (L) 12/27/2022   K 3.5 12/27/2022   CL 102 12/27/2022   CO2 24 12/27/2022   BUN 24 (H) 12/27/2022   CREATININE 0.98 12/27/2022   GLUCOSE 111 (H) 12/27/2022   GFRNONAA >60 12/27/2022   GFRAA 97 07/19/2020   CALCIUM 8.1 (L) 12/27/2022   PHOS 4.2 12/26/2022   PROT 5.6 (L) 12/27/2022   ALBUMIN 2.8 (L) 12/27/2022   LABGLOB 2.6 11/29/2022   AGRATIO 1.4 11/29/2022   BILITOT 4.3 (H) 12/27/2022   ALKPHOS 226 (H) 12/27/2022   AST 42 (H) 12/27/2022   ALT 41 12/27/2022   ANIONGAP 7 12/27/2022    GFR: Estimated Creatinine Clearance: 63 mL/min (by C-G formula based on SCr of 0.98 mg/dL).  Recent Results (from the past 240 hour(s))  MRSA Next Gen by PCR, Nasal     Status: None   Collection Time: 12/25/22  6:50 PM   Specimen: Nasal Mucosa; Nasal Swab  Result Value Ref Range Status   MRSA by PCR Next Gen NOT DETECTED NOT DETECTED Final    Comment: (NOTE) The GeneXpert MRSA Assay (FDA approved for NASAL specimens only), is one component of a comprehensive MRSA colonization surveillance program. It is not intended to diagnose MRSA infection nor to guide or monitor treatment for MRSA infections. Test  performance is not FDA approved in patients less than 67 years old. Performed at [facility], [address] [place], [place] [address]       Radiology Studies: DG CHEST PORT 1 VIEW  Result Date: 12/28/2022 CLINICAL DATA:  Hypoxia EXAM: PORTABLE CHEST 1 VIEW COMPARISON:  12/25/2022 FINDINGS: Status post median sternotomy and CABG. Unchanged enlarged cardiac silhouette. Aortic atherosclerosis. Slightly increased right greater than left pleural effusions with associated atelectasis. Increased heterogeneous opacities in the bilateral lungs. IMPRESSION: Slightly increased right greater than left pleural effusions with associated atelectasis. Increased heterogeneous opacities in the bilateral lungs, likely pulmonary edema, although superimposed infection is not excluded. Electronically Signed   By: Wiliam Ke M.D.   On: 12/28/2022 13:16      LOS: 3 days    Jacquelin Hawking, MD [facility] 12/28/2022, 1:28 PM   If 7PM-7AM, please contact night-coverage [email or web address]

## 2022-12-28 NOTE — Progress Notes (Signed)
Gastroenterology Progress Note   Referring Provider: No ref. provider found Primary Care Physician:  [name], FNP Primary Gastroenterologist:  Dr.  Patient ID: Leonard Cisneros; [id number]; 24-Apr-1941    Subjective   Doing well this afternoon.  Sitting up in the chair.  Tolerating his diet.  Just returned from thoracentesis and is breathing well.  Denies any abdominal pain, nausea, vomiting.  Denies any melena.  Objective   Vital signs in last 24 hours Temp:  [97.4 F (36.3 C)-98.1 F (36.7 C)] 97.7 F (36.5 C) (04/26 1543) Pulse Rate:  [82-139] 82 (04/26 1543) Resp:  [14-23] 16 (04/26 1543) BP: (91-120)/(38-75) 95/59 (04/26 1543) SpO2:  [81 %-100 %] 94 % (04/26 1543) Weight:  [89.6 kg] 89.6 kg (04/26 0500) Last BM Date : 12/27/22  Physical Exam General:   Alert and oriented, pleasant Head:  Normocephalic and atraumatic. Eyes:  No icterus, sclera clear. Conjuctiva pink.  Heart: Irregularly irregular, rate controlled. Lungs: Clear to auscultation bilaterally, without wheezing, rales, or rhonchi.  Abdomen:  Bowel sounds present, soft, non-tender, non-distended. No HSM or hernias noted. No rebound or guarding. No masses appreciated  Neurologic:  Alert and  oriented x4;  grossly normal neurologically. Psych:  Alert and cooperative. Normal mood and affect.  Intake/Output from previous day: 04/25 0701 - 04/26 0700 In: 675.7 [P.O.:240; I.V.:435.7] Out: 1200 [Urine:1200] Intake/Output this shift: Total I/O In: 360 [P.O.:360] Out: -   Lab Results  Recent Labs    12/26/22 1946 12/27/22 0742 12/28/22 0630  WBC 11.4* 8.5 8.0  HGB 8.8* 8.4* 8.4*  HCT 27.0* 25.3* 25.8*  PLT 180 159 156   BMET Recent Labs    12/26/22 0630 12/27/22 0742  NA 134* 133*  K 4.3 3.5  CL 101 102  CO2 25 24  GLUCOSE 125* 111*  BUN 34* 24*  CREATININE 1.30* 0.98  CALCIUM 8.4* 8.1*   LFT Recent Labs    12/26/22 0630 12/27/22 0742  PROT 5.8* 5.6*  ALBUMIN 3.0* 2.8*  AST  47* 42*  ALT 44 41  ALKPHOS 251* 226*  BILITOT 4.8* 4.3*   PT/INR Recent Labs    12/26/22 0631  LABPROT 21.6*  INR 1.9*   Hepatitis Panel No results for input(s): "HEPBSAG", "HCVAB", "HEPAIGM", "HEPBIGM" in the last 72 hours.  Studies/Results US THORACENTESIS ASP PLEURAL SPACE W/IMG GUIDE  Result Date: 12/28/2022 INDICATION: Bilateral pleural effusion--- Right greater than Left EXAM: ULTRASOUND GUIDED Right THORACENTESIS MEDICATIONS: 10 cc 1% lidocaine COMPLICATIONS: None immediate. PROCEDURE: An ultrasound guided thoracentesis was thoroughly discussed with the patient and questions answered. The benefits, risks, alternatives and complications were also discussed. The patient understands and wishes to proceed with the procedure. Written consent was obtained. Ultrasound was performed to localize and mark an adequate pocket of fluid in the right chest. The area was then prepped and draped in the normal sterile fashion. 1% Lidocaine was used for local anesthesia. Under ultrasound guidance a 19 gauge, 7-cm, Yueh catheter was introduced. Thoracentesis was performed. The catheter was removed and a dressing applied. FINDINGS: A total of approximately 1.5 liters of clear yellow fluid was removed. Samples were sent to the laboratory as requested by the clinical team. IMPRESSION: Successful ultrasound guided right thoracentesis yielding yellow of pleural fluid. Post procedure CXR: No PTX per Dr Tyron Russell Read by Robet Leu [facility] Electronically Signed   By: Ulyses Southward M.D.   On: 12/28/2022 15:25   DG Chest 1 View  Result Date: 12/28/2022 CLINICAL DATA:  Post  RIGHT thoracentesis EXAM: CHEST  1 VIEW COMPARISON:  Exam at 1450 hours compared to 1301 hours FINDINGS: Enlargement of cardiac silhouette post CABG. Atherosclerotic calcification aorta. Bibasilar effusions and atelectasis, decreased on RIGHT since prior exam. No pneumothorax following RIGHT thoracentesis. No new infiltrates. IMPRESSION: No  pneumothorax following RIGHT thoracentesis. Electronically Signed   By: Ulyses Southward M.D.   On: 12/28/2022 15:19   DG CHEST PORT 1 VIEW  Result Date: 12/28/2022 CLINICAL DATA:  Hypoxia EXAM: PORTABLE CHEST 1 VIEW COMPARISON:  12/25/2022 FINDINGS: Status post median sternotomy and CABG. Unchanged enlarged cardiac silhouette. Aortic atherosclerosis. Slightly increased right greater than left pleural effusions with associated atelectasis. Increased heterogeneous opacities in the bilateral lungs. IMPRESSION: Slightly increased right greater than left pleural effusions with associated atelectasis. Increased heterogeneous opacities in the bilateral lungs, likely pulmonary edema, although superimposed infection is not excluded. Electronically Signed   By: Wiliam Ke M.D.   On: 12/28/2022 13:16   US Abdomen Limited RUQ (LIVER/GB)  Result Date: 12/25/2022 CLINICAL DATA:  Elevated bilirubin. Elevated liver function studies. EXAM: ULTRASOUND ABDOMEN LIMITED RIGHT UPPER QUADRANT COMPARISON:  09/22/2022 FINDINGS: Gallbladder: Cholelithiasis with multiple stones demonstrated in the gallbladder, largest measuring about 5 mm in diameter. No gallbladder wall thickening, edema, or sludge. Murphy's sign is negative. Common bile duct: Diameter: Common bile duct is not visualized. No intrahepatic bile ductal dilatation. Liver: No focal lesion identified. Within normal limits in parenchymal echogenicity. Portal vein is patent on color Doppler imaging with normal direction of blood flow towards the liver. Other: A moderate right pleural effusion is incidentally visualized. IMPRESSION: 1. Cholelithiasis without additional changes to suggest acute cholecystitis. 2. Right pleural effusion. 3. Similar appearance to previous study. Electronically Signed   By: Burman Nieves M.D.   On: 12/25/2022 17:17   DG Chest 2 View  Result Date: 12/25/2022 CLINICAL DATA:  Chest pain. EXAM: CHEST - 2 VIEW COMPARISON:  Chest x-ray May 18 24.  FINDINGS: Enlarged cardiac silhouette. Pulmonary vascular gesture. Mild diffuse interstitial prominence. Small bilateral pleural effusions. Overlying bibasilar opacities. No visible pneumothorax. Polyarticular degenerative change. CABG and median sternotomy. IMPRESSION: Cardiomegaly, pulmonary vascular congestion, bilateral pleural effusions and mild interstitial prominence, suggestive of pulmonary edema. Superimposed pneumonia at the lung bases is not excluded. Electronically Signed   By: Feliberto Harts M.D.   On: 12/25/2022 09:48    Assessment  82 y.o. male with a history of ischemic cardiomyopathy, CAD s/p CABG, A-fib previously on Eliquis, HTN, HLD, moderate to severe aortic stenosis, duodenal ulcer on EGD in March 2024 who presented to the ED with complaint of chest pain on 4/23.  He was intermittently tachycardic in the ED with a hemoglobin of 5.0 and heme positive stool.  Creatinine, BNP, lactic acid, and troponin is all elevated.  GI consulted for anemia and heme positive stool.  Acute on chronic anemia and heme positive stool: Recently seen in the clinic outpatient for hospital follow-up where he underwent an EGD in March showing nonbleeding duodenal ulcer.  He continued PPI outpatient as recommended at his last office visit however he stopped his course of Carafate rather than continuing his full 1 month course.  At time of office visit his melena had resolved but a few days prior to admission it returned.  He presented with some mild epigastric pain and denied NSAID use.  Was continued on Eliquis.  There was plans to recheck CBC in 1 week after his office visit however patient presented to the ED prior to this.  He underwent follow-up EGD yesterday 4/25 with evidence of mild Schatzki's ring, gastritis, nonbleeding duodenal ulcer with clean ulcer base (5 mm in largest dimension, improved from prior).  He is continue on IV PPI twice daily.  He was to be restarted on Eliquis today however cardiology  has recommended discontinuation and he will follow-up outpatient.  Elevated bilirubin and alk phos: Patient has had ongoing elevated LFTs dating back to March 2023.  Labs at that time with AST 92, ALT 73, alk phos 512, T. bili 2.2.  Abdominal ultrasound in January with cholelithiasis without cholecystitis or biliary ductal dilation, probable fatty infiltration of the liver.  Fractionated bilirubin on 4/23 with T. bili 3.2, direct 1, and indirect 2.2.  Labs yesterday 12/27/2022 with alk phos 226, T. bili 4.3, AST 42, ALT 41, albumin 2.8.  RUQ [place] this admission 12/25/22 again with cholelithiasis, negative for acute cholecystitis and right pleural effusion.  Etiology unclear at this time but given his cardiac function is could be secondary to congestive hepatopathy.  Will consider further workup outpatient.  Plan / Recommendations  Trend H/H, transfuse Hgb <7 IV PPI twice daily, transition to p.o. twice daily on discharge Continue Carafate 1g 3 times daily Keep follow-up with cardiology Follow-up outpatient for further evaluation of elevated LFTs and bilirubin HFP tomorrow After discharge he will need CBC within a week and follow up in the office in 3-4 weeks.     LOS: 3 days    12/28/2022, 4:16 PM   Brooke Bonito, MSN, FNP-BC, AGACNP-BC [facility]

## 2022-12-28 NOTE — Progress Notes (Signed)
Pt transported to [place] suite per stretcher, in no acute distress. Procedure explained, consent obtained. Placed in upright sitting. Prepped/draped in sterile fashion. Anesthetic admin without adverse reaction. Access obtained without difficulty. Clear serous fluid retreived under vacutainer suction. 1.5L total withdrawn, with good result. O2 sat at 97%. Access withdrawn, no bleeding, no hematoma, bandaid in place.

## 2022-12-28 NOTE — Progress Notes (Signed)
SATURATION QUALIFICATIONS: (This note is used to comply with regulatory documentation for home oxygen)  Patient Saturations on Room Air at Rest = 96%  Patient Saturations on Room Air while Ambulating = 86%  Patient Saturations on 2Liters of oxygen while Ambulating = 100%  Please briefly explain why patient needs home oxygen: 

## 2022-12-28 NOTE — Procedures (Cosign Needed Addendum)
   US guided Right thoracentesis 1.5 Liters clear yellow fluid Sent for labs per MD  Tolerated well  EBL: scant  CXR: No PTX per Dr Tyron Russell

## 2022-12-28 NOTE — TOC Transition Note (Signed)
Transition of Care [facility]) - CM/SW Discharge Note   Patient Details  Name: Leonard Cisneros MRN: [id number] Date of Birth: 11-25-1940  Transition of Care [facility]) CM/SW Contact:  Elliot Gault, LCSW Phone Number: 12/28/2022, 1:01 PM   Clinical Narrative:     Pt stable for dc home today per MD. PT recommending outpatient rehab and pt needs home O2 arranged.  Spoke with pt and his wife to review above. Pt agreeable to outpatient rehab at the [facility] site on [address]. CMS provider options for Home O2 reviewed and referred to [facility] as requested.  Referral made for outpatient PT. Portable O2 tank for dc brought to pt's room. [facility] will coordinate with pt and his wife for delivery of Home O2 equipment.  There are no other TOC needs for dc.  Final next level of care: OP Rehab Barriers to Discharge: Barriers Resolved   Patient Goals and CMS Choice CMS Medicare.gov Compare Post Acute Care list provided to:: Patient Represenative (must comment) Choice offered to / list presented to : Spouse  Discharge Placement                         Discharge Plan and Services Additional resources added to the After Visit Summary for   In-house Referral: Clinical Social Work   Post Acute Care Choice: Durable Medical Equipment          DME Arranged: Oxygen DME Agency: Patsy Lager Date DME Agency Contacted: 12/28/22   Representative spoke with at DME Agency: Morrie Sheldon            Social Determinants of Health (SDOH) Interventions SDOH Screenings   Food Insecurity: No Food Insecurity (12/25/2022)  Housing: Low Risk  (12/25/2022)  Transportation Needs: No Transportation Needs (12/25/2022)  Utilities: Not At Risk (12/25/2022)  Alcohol Screen: Low Risk  (11/25/2022)  Depression (PHQ2-9): Low Risk  (11/29/2022)  Financial Resource Strain: Low Risk  (11/25/2022)  Physical Activity: Unknown (11/25/2022)  Social Connections: Moderately Integrated (11/25/2022)  Stress: No Stress Concern Present  (11/25/2022)  Tobacco Use: Medium Risk (12/27/2022)     Readmission Risk Interventions    12/26/2022   10:54 AM 09/25/2022   12:42 PM  Readmission Risk Prevention Plan  Transportation Screening Complete Complete  PCP or Specialist Appt within 5-7 Days  Not Complete  Home Care Screening  Complete  Medication Review (RN CM)  Complete  HRI or Home Care Consult Complete   Social Work Consult for Recovery Care Planning/Counseling Complete   Palliative Care Screening Not Applicable   Medication Review Oceanographer) Complete

## 2022-12-28 NOTE — Progress Notes (Signed)
Report received from [place], [place]. Vitals stable. No c/o pain noted. Pt did not sleep during the night.

## 2022-12-28 NOTE — Evaluation (Signed)
Physical Therapy Evaluation Patient Details Name: Leonard Cisneros MRN: [id number] DOB: 05-12-1941 Today's Date: 12/28/2022  History of Present Illness  Leonard Cisneros  is a 82 y.o. male with PMH of CAD/CABG with recent LHC showing 3v-CAD, moderate aortic stenosis, ICM, diastolic CHF, lower back pain, A-flutter s/p ablation, permanent A-fib on Eliquis, PAD, anemia and HTN and recent EGD from 11/21/2022 showing duodenal ulcer who presents to the ED with concerns for melena   Clinical Impression  Patient O2 sat monitored throughout session on room air: at rest 93-94%, with ambulation mostly 85-86% with cueing for breathing as patient tends to talk with sat decreasing to 77%. Patient asymptomatic with decrease in O2 sat. Patient able to ambulate with RW without loss of balance and returns to chair at end of session. Patient agreeable to to outpatient PT in order to improve general strength and conditioning. Patient discharged to care of nursing for ambulation daily as tolerated for length of stay.      Recommendations for follow up therapy are one component of a multi-disciplinary discharge planning process, led by the attending physician.  Recommendations may be updated based on patient status, additional functional criteria and insurance authorization.  Follow Up Recommendations       Assistance Recommended at Discharge PRN  Patient can return home with the following  Assistance with cooking/housework    Equipment Recommendations None recommended by PT  Recommendations for Other Services       Functional Status Assessment Patient has had a recent decline in their functional status and demonstrates the ability to make significant improvements in function in a reasonable and predictable amount of time.     Precautions / Restrictions Precautions Precautions: Fall Restrictions Weight Bearing Restrictions: No      Mobility  Bed Mobility               General bed mobility  comments: seated in chair at beginning of session    Transfers Overall transfer level: Modified independent Equipment used: Rolling walker (2 wheels) Transfers: Sit to/from Stand Sit to Stand: Supervision, Modified independent (Device/Increase time)           General transfer comment: with RW    Ambulation/Gait Ambulation/Gait assistance: Min guard Gait Distance (Feet): 100 Feet Assistive device: Rolling walker (2 wheels) Gait Pattern/deviations: Step-through pattern, Trunk flexed       General Gait Details: cueing for upright posture, breathing  Stairs            Wheelchair Mobility    Modified Rankin (Stroke Patients Only)       Balance Overall balance assessment: Mild deficits observed, not formally tested                                           Pertinent Vitals/Pain Pain Assessment Pain Assessment: No/denies pain    Home Living Family/patient expects to be discharged to:: Private residence Living Arrangements: Spouse/significant other Available Help at Discharge: Family;Available 24 hours/day Type of Home: House Home Access: Stairs to enter Entrance Stairs-Rails: Right Entrance Stairs-Number of Steps: 1   Home Layout: One level Home Equipment: Cane - single Librarian, academic (2 wheels);Crutches Additional Comments: Pt reports he prefers to ambulate with cructches    Prior Function Prior Level of Function : Independent/Modified Independent;Driving             Mobility Comments: uses 2 canes vs crutches to  ambulate, reports driving PTA ADLs Comments: Pt reports being independent in bADLs and IADLs     Hand Dominance   Dominant Hand: Right    Extremity/Trunk Assessment   Upper Extremity Assessment Upper Extremity Assessment: Defer to OT evaluation    Lower Extremity Assessment Lower Extremity Assessment: Overall WFL for tasks assessed;Generalized weakness    Cervical / Trunk Assessment Cervical / Trunk  Assessment: Kyphotic  Communication   Communication: HOH  Cognition Arousal/Alertness: Awake/alert Behavior During Therapy: WFL for tasks assessed/performed Overall Cognitive Status: Within Functional Limits for tasks assessed                                          General Comments      Exercises     Assessment/Plan    PT Assessment All further PT needs can be met in the next venue of care  PT Problem List Decreased strength;Decreased activity tolerance;Decreased balance;Decreased mobility       PT Treatment Interventions      PT Goals (Current goals can be found in the Care Plan section)  Acute Rehab PT Goals Patient Stated Goal: return home PT Goal Formulation: With patient/family Time For Goal Achievement: 12/28/22 Potential to Achieve Goals: Good    Frequency       Co-evaluation               AM-PAC PT "6 Clicks" Mobility  Outcome Measure Help needed turning from your back to your side while in a flat bed without using bedrails?: None Help needed moving from lying on your back to sitting on the side of a flat bed without using bedrails?: None Help needed moving to and from a bed to a chair (including a wheelchair)?: None Help needed standing up from a chair using your arms (e.g., wheelchair or bedside chair)?: None Help needed to walk in hospital room?: A Little Help needed climbing 3-5 steps with a railing? : A Little 6 Click Score: 22    End of Session   Activity Tolerance: Patient tolerated treatment well Patient left: in chair;with call bell/phone within reach Nurse Communication: Mobility status PT Visit Diagnosis: Unsteadiness on feet (R26.81);Other abnormalities of gait and mobility (R26.89);Muscle weakness (generalized) (M62.81)    Time: 1610-9604 PT Time Calculation (min) (ACUTE ONLY): 20 min   Charges:   PT Evaluation $PT Eval Low Complexity: 1 Low PT Treatments $Therapeutic Activity: 8-22 mins       12:38 PM,  12/28/22 Wyman Songster PT, DPT Physical Therapist at [facility]

## 2022-12-28 NOTE — Care Management Important Message (Signed)
Important Message  Patient Details  Name: Leonard Cisneros MRN: [id number] Date of Birth: [birth year]   Medicare Important Message Given:  Yes     Corey Harold 12/28/2022, 11:31 AM

## 2022-12-29 DIAGNOSIS — K2970 Gastritis, unspecified, without bleeding: Secondary | ICD-10-CM | POA: Diagnosis not present

## 2022-12-29 DIAGNOSIS — I4891 Unspecified atrial fibrillation: Secondary | ICD-10-CM | POA: Diagnosis not present

## 2022-12-29 DIAGNOSIS — K264 Chronic or unspecified duodenal ulcer with hemorrhage: Principal | ICD-10-CM

## 2022-12-29 DIAGNOSIS — R578 Other shock: Secondary | ICD-10-CM | POA: Diagnosis not present

## 2022-12-29 DIAGNOSIS — I5033 Acute on chronic diastolic (congestive) heart failure: Secondary | ICD-10-CM | POA: Diagnosis not present

## 2022-12-29 LAB — LACTATE DEHYDROGENASE: LDH: 157 U/L (ref 98–192)

## 2022-12-29 LAB — HEPATIC FUNCTION PANEL
ALT: 49 U/L — ABNORMAL HIGH (ref 0–44)
AST: 51 U/L — ABNORMAL HIGH (ref 15–41)
Albumin: 2.6 g/dL — ABNORMAL LOW (ref 3.5–5.0)
Alkaline Phosphatase: 243 U/L — ABNORMAL HIGH (ref 38–126)
Bilirubin, Direct: 1.3 mg/dL — ABNORMAL HIGH (ref 0.0–0.2)
Indirect Bilirubin: 2.4 mg/dL — ABNORMAL HIGH (ref 0.3–0.9)
Total Bilirubin: 3.7 mg/dL — ABNORMAL HIGH (ref 0.3–1.2)
Total Protein: 5.2 g/dL — ABNORMAL LOW (ref 6.5–8.1)

## 2022-12-29 LAB — CBC
HCT: 24.2 % — ABNORMAL LOW (ref 39.0–52.0)
Hemoglobin: 7.8 g/dL — ABNORMAL LOW (ref 13.0–17.0)
MCH: 34.2 pg — ABNORMAL HIGH (ref 26.0–34.0)
MCHC: 32.2 g/dL (ref 30.0–36.0)
MCV: 106.1 fL — ABNORMAL HIGH (ref 80.0–100.0)
Platelets: 154 10*3/uL (ref 150–400)
RBC: 2.28 MIL/uL — ABNORMAL LOW (ref 4.22–5.81)
RDW: 23.7 % — ABNORMAL HIGH (ref 11.5–15.5)
WBC: 6 10*3/uL (ref 4.0–10.5)
nRBC: 0 % (ref 0.0–0.2)

## 2022-12-29 LAB — HEMOGLOBIN AND HEMATOCRIT, BLOOD
HCT: 25.1 % — ABNORMAL LOW (ref 39.0–52.0)
Hemoglobin: 8.3 g/dL — ABNORMAL LOW (ref 13.0–17.0)

## 2022-12-29 MED ORDER — SODIUM CHLORIDE 0.9 % IV BOLUS
500.0000 mL | Freq: Once | INTRAVENOUS | Status: AC
  Administered 2022-12-29: 500 mL via INTRAVENOUS

## 2022-12-29 NOTE — Discharge Summary (Signed)
Physician Discharge Summary   Patient: Leonard Cisneros MRN: [id number] DOB: May 14, 1941  Admit date:     12/25/2022  Discharge date: 12/29/22  Discharge Physician: Jacquelin Hawking, MD   PCP: [name], FNP   Recommendations at discharge:  PCP follow-up GI follow-up Cardiology follow-up Pleural fluid culture data  Discharge Diagnoses: Principal Problem:   Chest pain Active Problems:   Acute on chronic diastolic congestive heart failure (HCC)   Anemia due to chronic blood loss   Essential hypertension   CAD S/P percutaneous coronary angioplasty   PAD (peripheral artery disease) (HCC)   Atrial fibrillation with RVR (HCC)   Severe pulmonary hypertension (HCC)   History of CAD (coronary artery disease)   Abnormal liver enzymes   Severe anemia   Heme positive stool   Gastritis and gastroduodenitis   Hemorrhagic shock (HCC)   ABLA (acute blood loss anemia)   Gastrointestinal hemorrhage   Lactic acidosis   Coronary artery disease involving native coronary artery of native heart without angina pectoris   Hypovolemic shock (HCC)   Permanent atrial fibrillation (HCC)   Contraindication to anticoagulation therapy  Resolved Problems:   * No resolved hospital problems. *  Hospital Course: Leonard Cisneros is a 82 y.o. male with a history of CAD s/p CABG, aortic stenosis, ischemic cardiomyopathy, diastolic heart failure, back pain, atrial flutter s/p ablation, atrial fibrillation, PAD, anemia, hypertension, duodenal ulcer. Patient presented secondary to melena, complicated by Eliquis use, with associated symptomatic anemia requiring blood transfusion. GI and cardiology consulted. Hospitalization complicated by hemorrhagic shock requiring ICU admission and vasopressor support. Shock resolved with blood replacement. Upper endoscopy significant for a non-bleeding duodenal ulcer and gastritis. Prior to discharge, patient was noted to have hypoxia with ambulation. Chest x-ray revealed  pleural effusion and thoracentesis yielded 1.5 liters of transudate fluid likely related to fluid resuscitation from hemorrhagic shock.  Assessment and Plan:  Acute GI bleed Patient with a history of melena and severe anemia. Concern for upper GI bleeding. Complicated by Eliquis use. Patient with history of duodenal ulcers diagnosed on recent EGD in 11/2022. Protonix IV BID started. Eliquis held. Gastroenterology consulted. No recurrent bleeding per patient. Upper GI endoscopy performed on 4/25 was significant for a mild Schatzki ring, gastritis and a non-bleeding duodenal ulcer. GI recommendations for PPI BID on discharge.   Acute blood loss anemia Symptomatic anemia Chronic anemia [name] baseline hemoglobin of between 8-9. Hemoglobin of 5.0 on admission secondary to acute GI bleeding. Associated dyspnea, chest pain, fatigue. 3 units of PRBCs ordered for transfusion. After 3 units, post-transfusion hemoglobin of 7.7. Additional 1 unit of PRBC given on 4/24 with final post-transfusion hemoglobin of 8.8. Hemoglobin drifted down to 8.4 and has stabilized.   Hemorrhagic shock Secondary to acute GI bleeding and resultant blood loss. Levophed IV started peripherally on 4/24. Improved with fluid and blood resuscitation. Levophed weaned off on 4/24. Resolved.   Acute myocardial injury Chest pain Troponin elevation of 216 > 220. Cardiology consulted. Injury in setting of acute blood loss. Chest pain resolved with completion of blood transfusions.   Permanent atrial fibrillation Patient is managed on Eliquis and Toprol XL as an outpatient. Eliquis held on admission secondary to GI bleeding. Metoprolol continued. Cardiology recommending to discontinue Eliquis.   Pulmonary edema Pleural effusion Noted on imaging. Patient is now symptomatic with hypoxia. Right-sided thoracentesis performed and yielded 1.5 liters of transudate fluid.   Hypoxia Secondary to pleural effusion and pulmonary edema.  Resolved with thoracentesis and Lasix.   Hyponatremia  Mild. Asymptomatic.   CAD Stable angina History of CABG and bare metal stent to SVG-RI. Patient is managed on Imdur and metoprolol as an outpatient.    Chronic HFpEF Last LVEF of 55-60% from Transthoracic Echocardiogram in 09/2022.   Elevated AST/ALP Hyperbilirubinemia Unclear etiology. RUQ ultrasound obtained and significant for cholelithiasis without evidence of acute cholecystitis. Levels stabilized.   Moderate-severe aortic stenosis Noted.   Consultants: Gastroenterology, Cardiology Procedures performed:  4/25: Upper GI endoscopy  4/26: Right-sided thoracentesis  Disposition: Home Diet recommendation: Cardiac diet   DISCHARGE MEDICATION: Allergies as of 12/29/2022       Reactions   Augmentin [amoxicillin-pot Clavulanate] Nausea And Vomiting, Other (See Comments)   Has patient had a PCN reaction causing immediate rash, facial/tongue/throat swelling, SOB or lightheadedness with hypotension: Unknown Has patient had a PCN reaction causing severe rash involving mucus membranes or skin necrosis: No Has patient had a PCN reaction that required hospitalization: No Has patient had a PCN reaction occurring within the last 10 years: No If all of the above answers are "NO", then may proceed with Cephalosporin use.        Medication List     STOP taking these medications    Eliquis 5 MG Tabs tablet Generic drug: apixaban   isosorbide mononitrate 30 MG 24 hr tablet Commonly known as: IMDUR       TAKE these medications    atorvastatin 80 MG tablet Commonly known as: LIPITOR TAKE 1 TABLET BY MOUTH EVERY DAY   Iron (Ferrous Sulfate) 325 (65 Fe) MG Tabs Take 325 mg by mouth daily.   Klor-Con M10 10 MEQ tablet Generic drug: potassium chloride TAKE 3 TABLETS BY MOUTH DAILY.   metoprolol succinate 25 MG 24 hr tablet Commonly known as: TOPROL-XL Take 1 tablet (25 mg total) by mouth daily.   nitroGLYCERIN 0.4  MG SL tablet Commonly known as: NITROSTAT Place 1 tablet (0.4 mg total) under the tongue every 5 (five) minutes x 3 doses as needed for chest pain (if no relief after 3rd dose, proceed to the ED for an evaluation or call 911).   pantoprazole 40 MG tablet Commonly known as: Protonix Take 1 tablet (40 mg total) by mouth 2 (two) times daily.   sucralfate 1 GM/10ML suspension Commonly known as: CARAFATE Take 10 mLs (1 g total) by mouth in the morning, at noon, and at bedtime.   torsemide 20 MG tablet Commonly known as: DEMADEX Take 3 tablets (60 mg total) by mouth daily.        Discharge Exam: BP 99/60 (BP Location: Left Arm)   Pulse 91   Temp 98 F (36.7 C) (Oral)   Resp 16   Ht 5\' 11"  (1.803 m)   Wt 88.5 kg   SpO2 95%   BMI 27.21 kg/m   General exam: Appears calm and comfortable Respiratory system: Clear to auscultation. Respiratory effort normal. Cardiovascular system: S1 & S2 heard, RRR. Gastrointestinal system: Abdomen is nondistended, soft and nontender. Normal bowel sounds heard. Central nervous system: Alert and oriented. No focal neurological deficits. Musculoskeletal: Trace edema. No calf tenderness Skin: No cyanosis. No rashes Psychiatry: Judgement and insight appear normal. Mood & affect appropriate.   Condition at discharge: stable  The results of significant diagnostics from this hospitalization (including imaging, microbiology, ancillary and laboratory) are listed below for reference.   Imaging Studies: US THORACENTESIS ASP PLEURAL SPACE W/IMG GUIDE  Result Date: 12/28/2022 INDICATION: Bilateral pleural effusion--- Right greater than Left EXAM: ULTRASOUND GUIDED Right THORACENTESIS MEDICATIONS:  10 cc 1% lidocaine COMPLICATIONS: None immediate. PROCEDURE: An ultrasound guided thoracentesis was thoroughly discussed with the patient and questions answered. The benefits, risks, alternatives and complications were also discussed. The patient understands and  wishes to proceed with the procedure. Written consent was obtained. Ultrasound was performed to localize and mark an adequate pocket of fluid in the right chest. The area was then prepped and draped in the normal sterile fashion. 1% Lidocaine was used for local anesthesia. Under ultrasound guidance a 19 gauge, 7-cm, Yueh catheter was introduced. Thoracentesis was performed. The catheter was removed and a dressing applied. FINDINGS: A total of approximately 1.5 liters of clear yellow fluid was removed. Samples were sent to the laboratory as requested by the clinical team. IMPRESSION: Successful ultrasound guided right thoracentesis yielding yellow of pleural fluid. Post procedure CXR: No PTX per Dr Tyron Russell Read by Robet Leu [facility] Electronically Signed   By: Ulyses Southward M.D.   On: 12/28/2022 15:25   DG Chest 1 View  Result Date: 12/28/2022 CLINICAL DATA:  Post RIGHT thoracentesis EXAM: CHEST  1 VIEW COMPARISON:  Exam at 1450 hours compared to 1301 hours FINDINGS: Enlargement of cardiac silhouette post CABG. Atherosclerotic calcification aorta. Bibasilar effusions and atelectasis, decreased on RIGHT since prior exam. No pneumothorax following RIGHT thoracentesis. No new infiltrates. IMPRESSION: No pneumothorax following RIGHT thoracentesis. Electronically Signed   By: Ulyses Southward M.D.   On: 12/28/2022 15:19   DG CHEST PORT 1 VIEW  Result Date: 12/28/2022 CLINICAL DATA:  Hypoxia EXAM: PORTABLE CHEST 1 VIEW COMPARISON:  12/25/2022 FINDINGS: Status post median sternotomy and CABG. Unchanged enlarged cardiac silhouette. Aortic atherosclerosis. Slightly increased right greater than left pleural effusions with associated atelectasis. Increased heterogeneous opacities in the bilateral lungs. IMPRESSION: Slightly increased right greater than left pleural effusions with associated atelectasis. Increased heterogeneous opacities in the bilateral lungs, likely pulmonary edema, although superimposed infection is not  excluded. Electronically Signed   By: Wiliam Ke M.D.   On: 12/28/2022 13:16   US Abdomen Limited RUQ (LIVER/GB)  Result Date: 12/25/2022 CLINICAL DATA:  Elevated bilirubin. Elevated liver function studies. EXAM: ULTRASOUND ABDOMEN LIMITED RIGHT UPPER QUADRANT COMPARISON:  09/22/2022 FINDINGS: Gallbladder: Cholelithiasis with multiple stones demonstrated in the gallbladder, largest measuring about 5 mm in diameter. No gallbladder wall thickening, edema, or sludge. Murphy's sign is negative. Common bile duct: Diameter: Common bile duct is not visualized. No intrahepatic bile ductal dilatation. Liver: No focal lesion identified. Within normal limits in parenchymal echogenicity. Portal vein is patent on color Doppler imaging with normal direction of blood flow towards the liver. Other: A moderate right pleural effusion is incidentally visualized. IMPRESSION: 1. Cholelithiasis without additional changes to suggest acute cholecystitis. 2. Right pleural effusion. 3. Similar appearance to previous study. Electronically Signed   By: Burman Nieves M.D.   On: 12/25/2022 17:17   DG Chest 2 View  Result Date: 12/25/2022 CLINICAL DATA:  Chest pain. EXAM: CHEST - 2 VIEW COMPARISON:  Chest x-ray May 18 24. FINDINGS: Enlarged cardiac silhouette. Pulmonary vascular gesture. Mild diffuse interstitial prominence. Small bilateral pleural effusions. Overlying bibasilar opacities. No visible pneumothorax. Polyarticular degenerative change. CABG and median sternotomy. IMPRESSION: Cardiomegaly, pulmonary vascular congestion, bilateral pleural effusions and mild interstitial prominence, suggestive of pulmonary edema. Superimposed pneumonia at the lung bases is not excluded. Electronically Signed   By: Feliberto Harts M.D.   On: 12/25/2022 09:48    Microbiology: Results for orders placed or performed during the hospital encounter of 12/25/22  MRSA Next Gen  by PCR, Nasal     Status: None   Collection Time: 12/25/22  6:50  PM   Specimen: Nasal Mucosa; Nasal Swab  Result Value Ref Range Status   MRSA by PCR Next Gen NOT DETECTED NOT DETECTED Final    Comment: (NOTE) The GeneXpert MRSA Assay (FDA approved for NASAL specimens only), is one component of a comprehensive MRSA colonization surveillance program. It is not intended to diagnose MRSA infection nor to guide or monitor treatment for MRSA infections. Test performance is not FDA approved in patients less than 64 years old. Performed at [facility], [address], [place], [place] [address]   Culture, body fluid w Gram Stain-bottle     Status: None (Preliminary result)   Collection Time: 12/28/22  2:40 PM   Specimen: Pleura  Result Value Ref Range Status   Specimen Description PLEURAL  Final   Special Requests 10CC BAA  Final   Culture   Final    NO GROWTH < 12 HOURS Performed at [facility], [address], [place], [place] [address]    Report Status PENDING  Incomplete  Gram stain     Status: None   Collection Time: 12/28/22  2:40 PM   Specimen: Pleura  Result Value Ref Range Status   Specimen Description PLEURAL  Final   Special Requests NONE  Final   Gram Stain   Final    NO ORGANISMS SEEN CYTOSPIN SMEAR WBC PRESENT,BOTH PMN AND MONONUCLEAR Performed at [facility], [address], [place], [place] [address]    Report Status 12/28/2022 FINAL  Final    Labs: CBC: Recent Labs  Lab 12/26/22 0631 12/26/22 1136 12/26/22 1946 12/27/22 0742 12/28/22 0630 12/29/22 0651 12/29/22 1040  WBC 10.8*  --  11.4* 8.5 8.0 6.0  --   HGB 7.7*   < > 8.8* 8.4* 8.4* 7.8* 8.3*  HCT 23.6*   < > 27.0* 25.3* 25.8* 24.2* 25.1*  MCV 106.3*  --  105.9* 104.5* 105.7* 106.1*  --   PLT 191  --  180 159 156 154  --    < > = values in this interval not displayed.   Basic Metabolic Panel: Recent Labs  Lab 12/25/22 0919 12/25/22 0927 12/25/22 1028 12/26/22 0630 12/27/22 0742  NA 130*  --  133* 134* 133*  K 4.3  --  4.4 4.3 3.5  CL 98  --  97* 101 102   CO2 20*  --   --  25 24  GLUCOSE 154*  --  151* 125* 111*  BUN 35*  --  35* 34* 24*  CREATININE 1.32*  --  1.50* 1.30* 0.98  CALCIUM 8.3*  --   --  8.4* 8.1*  MG  --  2.0  --   --   --   PHOS  --   --   --  4.2  --    Liver Function Tests: Recent Labs  Lab 12/25/22 0919 12/25/22 1054 12/26/22 0630 12/27/22 0742 12/29/22 0651  AST 49*  --  47* 42* 51*  ALT 43  --  44 41 49*  ALKPHOS 245*  --  251* 226* 243*  BILITOT 3.3* 3.2* 4.8* 4.3* 3.7*  PROT 5.9*  --  5.8* 5.6* 5.2*  ALBUMIN 3.1*  --  3.0* 2.8* 2.6*   CBG: Recent Labs  Lab 12/26/22 2336 12/27/22 0424 12/27/22 1114 12/27/22 1739 12/28/22 2344  GLUCAP 100* 101* 97 107* 100*    Discharge time spent: 35 minutes.  Signed:  Jacquelin Hawking, MD [facility] 12/29/2022

## 2022-12-29 NOTE — TOC Progression Note (Addendum)
Transition of Care [facility]) - Progression Note    Patient Details  Name: Leonard Cisneros MRN: [id number] Date of Birth: Apr 28, 1941  [facility] CM/SW Contact  Catalina Gravel, LCSW Phone Number: 12/29/2022, 2:08 PM  Clinical Narrative:    Pt dc today, no need for 02. CSW Contacted [employer] and advised.  CSW picked tank up from the room. Pt recently dc from [facility]. Pt left before new HHPT discussed. No orders were placed at dc.    Expected Discharge Plan: Home w Home Health Services Barriers to Discharge: No Barriers Identified  Expected Discharge Plan and Services In-house Referral: Clinical Social Work   Post Acute Care Choice: Durable Medical Equipment Living arrangements for the past 2 months: Single Family Home Expected Discharge Date: 12/29/22               DME Arranged: Oxygen DME Agency: Patsy Lager Date DME Agency Contacted: 12/28/22   Representative spoke with at DME Agency: Morrie Sheldon             Social Determinants of Health (SDOH) Interventions SDOH Screenings   Food Insecurity: No Food Insecurity (12/25/2022)  Housing: Low Risk  (12/25/2022)  Transportation Needs: No Transportation Needs (12/25/2022)  Utilities: Not At Risk (12/25/2022)  Alcohol Screen: Low Risk  (11/25/2022)  Depression (PHQ2-9): Low Risk  (11/29/2022)  Financial Resource Strain: Low Risk  (11/25/2022)  Physical Activity: Unknown (11/25/2022)  Social Connections: Moderately Integrated (11/25/2022)  Stress: No Stress Concern Present (11/25/2022)  Tobacco Use: Medium Risk (12/28/2022)    Readmission Risk Interventions    12/26/2022   10:54 AM 09/25/2022   12:42 PM  Readmission Risk Prevention Plan  Transportation Screening Complete Complete  PCP or Specialist Appt within 5-7 Days  Not Complete  Home Care Screening  Complete  Medication Review (RN CM)  Complete  HRI or Home Care Consult Complete   Social Work Consult for Recovery Care Planning/Counseling Complete   Palliative Care  Screening Not Applicable   Medication Review Oceanographer) Complete

## 2022-12-29 NOTE — Progress Notes (Signed)
Pt hypotensive during the night and this a.m., MD made aware. MAP low at 64 this a.m. 500 cc bolus given per MD order. No c/o pain noted. Pt slept through the night.

## 2022-12-29 NOTE — Progress Notes (Signed)
Ambulated patient this am from room to/from nurses station (about 100 feet) with rolling walker, O2 sats were 92% consistently without SOB or dyspnea with exertion.

## 2022-12-29 NOTE — Discharge Instructions (Signed)
Leonard Cisneros,  You were in the hospital with GI bleeding. This appears to be related to an ulcer in your intestine and complicated by your Eliquis use. Please stop your Eliquis. Please follow-up with the cardiologist, gastroenterologist and primary care physician. Please hold your Imdur because your blood pressure was on the low-normal side.

## 2022-12-30 LAB — CULTURE, BODY FLUID W GRAM STAIN -BOTTLE

## 2022-12-31 ENCOUNTER — Ambulatory Visit: Payer: Medicare HMO | Attending: Cardiology | Admitting: Cardiology

## 2022-12-31 ENCOUNTER — Encounter: Payer: Self-pay | Admitting: Cardiology

## 2022-12-31 ENCOUNTER — Telehealth: Payer: Self-pay

## 2022-12-31 VITALS — BP 114/52 | HR 90 | Ht 71.0 in | Wt 195.0 lb

## 2022-12-31 DIAGNOSIS — I4821 Permanent atrial fibrillation: Secondary | ICD-10-CM

## 2022-12-31 DIAGNOSIS — I5033 Acute on chronic diastolic (congestive) heart failure: Secondary | ICD-10-CM

## 2022-12-31 DIAGNOSIS — I4891 Unspecified atrial fibrillation: Secondary | ICD-10-CM

## 2022-12-31 DIAGNOSIS — I25119 Atherosclerotic heart disease of native coronary artery with unspecified angina pectoris: Secondary | ICD-10-CM

## 2022-12-31 DIAGNOSIS — I350 Nonrheumatic aortic (valve) stenosis: Secondary | ICD-10-CM

## 2022-12-31 DIAGNOSIS — I10 Essential (primary) hypertension: Secondary | ICD-10-CM

## 2022-12-31 LAB — BPAM RBC
Blood Product Expiration Date: 202405022359
Blood Product Expiration Date: 202405262359
Blood Product Expiration Date: 202405262359
Unit Type and Rh: 9500

## 2022-12-31 LAB — TYPE AND SCREEN
ABO/RH(D): O POS
Unit division: 0

## 2022-12-31 LAB — CULTURE, BODY FLUID W GRAM STAIN -BOTTLE: Culture: NO GROWTH

## 2022-12-31 NOTE — Consult Note (Signed)
[facility] Customer service manager [facility]) Accountable Care Organization (ACO) [facility] Liaison Note  12/31/2022  [name] 03/16/1941 [id number]  Location: [facility] RN Hospital Liaison screened the patient remotely at [facility].  Insurance: Leonard Cisneros is a 82 y.o. male who is a Primary Care Patient of [name], FNP. The patient was screened for readmission hospitalization with noted high risk score for unplanned readmission risk with 3 IP in 6 months.  The patient was assessed for potential [facility]) Care Management service needs for post hospital transition for care coordination. Review of patient's electronic medical record reveals patient is  eligible for [facility] services. Spoke with pt directly telephonically and introduced [facility] services. Pt receptive to a post hospital outreach call from the [facility] RN coordinator for ongoing follow call.  Will make a referral for [facility] RN to follow.   [facility] Care Management/Population Health does not replace or interfere with any arrangements made by the Inpatient Transition of Care team.   For questions contact:    Leonard Cousin, RN, BSN [facility] Liaison [place]   [facility]  Population Health Office Hours MTWF 8:00 am to 6 pm off on Thursday [phone] mobile [phone] [Office toll free line][facility] Office Hours are M-F 8:30 - 5 pm 24 hour nurse advise line [phone] Conceirge  [email or web address]

## 2022-12-31 NOTE — Patient Instructions (Addendum)
Medication Instructions:  Your physician recommends that you continue on your current medications as directed. Please refer to the Current Medication list given to you today.  Labwork: none  Testing/Procedures: Your physician has requested that you have an echocardiogram. Echocardiography is a painless test that uses sound waves to create images of your heart. It provides your doctor with information about the size and shape of your heart and how well your heart's chambers and valves are working. This procedure takes approximately one hour. There are no restrictions for this procedure. Please do NOT wear cologne, perfume, aftershave, or lotions (deodorant is allowed). Please arrive 15 minutes prior to your appointment time.  Follow-Up: Your physician recommends that you schedule a follow-up appointment in: 3 months  Any Other Special Instructions Will Be Listed Below (If Applicable). You have been referred to [facility] for Watchman  If you need a refill on your cardiac medications before your next appointment, please call your pharmacy.

## 2022-12-31 NOTE — Progress Notes (Signed)
Cardiology Office Note  Date: 12/31/2022   ID: Leonard Cisneros, DOB May 17, 1941, MRN [id number]  History of Present Illness: Leonard Cisneros is an 82 y.o. male last seen in mid April by [name], I reviewed the note.  Records also indicate recent hospitalization with acute GI bleed and severe anemia with hemoglobin of 5.  He was taken off anticoagulation and received PRBC transfusions.  He had evidence of demand ischemia by cardiac enzymes and continued on medical therapy for known ischemic heart disease.  He did undergo EGD which demonstrated a nonbleeding duodenal ulcer and gastritis.  He was discharged on PPI, also remains on iron supplement.  He is here today with his wife for follow-up visit.  States that he feels much better overall.  No chest pain or palpitations, no recent stool changes or constipation.  They have not yet heard from the structural heart clinic regarding Watchman evaluation, but referral has been placed.  I reviewed his medications which are otherwise unchanged.  He remains on stable diuretic regimen with potassium supplement.  ECG today shows rate controlled atrial fibrillation with nonspecific ST changes.  Physical Exam: VS:  BP (!) 114/52   Pulse 90   Ht 5\' 11"  (1.803 m)   Wt 195 lb (88.5 kg)   SpO2 96%   BMI 27.20 kg/m , BMI Body mass index is 27.2 kg/m.  Wt Readings from Last 3 Encounters:  12/31/22 195 lb (88.5 kg)  12/29/22 195 lb 1.7 oz (88.5 kg)  12/20/22 193 lb 12.8 oz (87.9 kg)    General: Patient appears comfortable at rest. HEENT: Conjunctiva and lids normal. Neck: Supple, no elevated JVP or carotid bruits. Lungs: Decreased breath sounds at the bases. Cardiac: Irregularly irregular with 3/6 systolic murmur. Extremities: Mild lower leg edema with chronic venous stasis.  ECG:  An ECG dated 12/25/2022 was personally reviewed today and demonstrated:  Atrial fibrillation with RVR, diffuse ST abnormalities.  Labwork: 11/20/2022: TSH  3.009 12/25/2022: B Natriuretic Peptide 658.0; Magnesium 2.0 12/27/2022: BUN 24; Creatinine, Ser 0.98; Potassium 3.5; Sodium 133 12/29/2022: ALT 49; AST 51; Hemoglobin 8.3; Platelets 154     Component Value Date/Time   CHOL 110 11/12/2021 0042   CHOL 140 10/04/2021 0843   TRIG 23 11/12/2021 0042   HDL 74 11/12/2021 0042   HDL 93 10/04/2021 0843   CHOLHDL 1.5 11/12/2021 0042   VLDL 5 11/12/2021 0042   LDLCALC 31 11/12/2021 0042   LDLCALC 35 10/04/2021 0843   Other Studies Reviewed Today:  Echocardiogram 09/25/2022:  1. Left ventricular ejection fraction, by estimation, is 55 to 60%. The  left ventricle has normal function. The left ventricle has no regional  wall motion abnormalities. There is moderate concentric left ventricular  hypertrophy. Left ventricular  diastolic parameters are indeterminate.   2. Right ventricular systolic function is normal. The right ventricular  size is mildly enlarged. Tricuspid regurgitation signal is inadequate for  assessing PA pressure.   3. Left atrial size was severely dilated.   4. Right atrial size was mildly dilated.   5. Left pleural effusion noted.   6. The mitral valve is degenerative. Mild mitral valve regurgitation.  Severe mitral annular calcification.   7. The aortic valve is tricuspid. There is moderate calcification of the  aortic valve. Aortic valve regurgitation is not visualized. Moderate to  severe aortic valve stenosis. Aortic valve mean gradient measures 24.0  mmHg. Dimentionless index 0.34.   8. Aortic dilatation noted. There is mild dilatation  of the ascending  aorta, measuring 39 mm.   9. The inferior vena cava is dilated in size with <50% respiratory  variability, suggesting right atrial pressure of 15 mmHg.   Assessment and Plan:  1.  Permanent atrial fibrillation with CHA2DS2-VASc score of 5.  No longer candidate for chronic anticoagulation in light of severe GI bleed as discussed above.  Organ  HeartCare Referral for Left Atrial Appendage Closure with Non-Valvular Atrial Fibrillation   Leonard Cisneros is a 82 y.o. male is being [facility] for evaluation for Left Atrial Appendage Closure with Watchman device for the management of stroke risk resulting form non-valvular atrial fibrillation.    Base upon [name]'s history, he is felt to be a poor candidate for long-term anticoagulation because of  severe GI bleeding necessitating condition discontinuation of therapy .  The patient has a HAS-BLED score of 5  indicating a Yearly Major Bleeding Risk of 12.5 %.    His CHADS2-VASc Score is 5 with an unadjusted Ischemic Stroke Rate (% per year) of 7.2%.  His stroke risk necessitates a strategy of stroke prevention with either long-term oral anticoagulation or left atrial appendage occlusion therapy. We have discussed their bleeding risk in the context of their comorbid medical problems, as well as the rationale for referral for evaluation of Watchman left atrial appendage occlusion therapy. While the patient is at high long-term bleeding risk, they may be appropriate for short-term anticoagulation. Based on this individual patient's stroke and bleeding risk, a shared decision has been made to refer the patient for consideration of Watchman left atrial appendage closure utilizing the [employer] shared decision tool.   2.  Multivessel CAD status post CABG in 2003 with LIMA to LAD, RIMA to RCA, and SVG to ramus intermedius.  Also status post BMS to SVG to ramus intermedius in 2017, but ultimately found to have occlusion of the SVG to ramus intermedius in January of this year managed medically.  LVEF 55 to 60% by echocardiogram in January of this year.  Reports no active angina at this time and continues on Lipitor with as needed nitroglycerin.  3.  Degenerative calcific aortic stenosis, moderate by echocardiogram in January demonstrating  mean AV gradient 24 mmHg and dimensionless index 0.34.  Plan to follow-up echocardiogram just prior to his next visit for surveillance.  4.  Essential hypertension.  Blood pressure control is adequate today.  No changes were made.  5.  Mixed hyperlipidemia.  He continues on Lipitor with last LDL 31.  Disposition:  Follow up  3 months.  Signed, [name], M.D., F.A.C.C. [facility]

## 2022-12-31 NOTE — Patient Outreach (Signed)
Received a referral from Elliot Cousin, [facility] Liaison.   I have sent it to the [facility] Care Guides to assign a Nurse Care Coordinator.  Iverson Alamin, Donivan Scull [facility] Care Management Assistant [facility] Care Management [phone]

## 2023-01-01 ENCOUNTER — Encounter (HOSPITAL_COMMUNITY): Payer: Self-pay | Admitting: Internal Medicine

## 2023-01-01 LAB — CYTOLOGY - NON PAP

## 2023-01-02 ENCOUNTER — Telehealth: Payer: Self-pay | Admitting: *Deleted

## 2023-01-02 LAB — CULTURE, BODY FLUID W GRAM STAIN -BOTTLE

## 2023-01-02 NOTE — Progress Notes (Signed)
  Care Coordination   Note   01/02/2023 Name: [name] MRN: [id number] DOB: 06-11-1941  Leonard Cisneros is a 82 y.o. year old male who sees [name], FNP for primary care. I reached out to Leonard Cisneros by phone today to offer care coordination services.  [name] was given information about Care Coordination services today including:   The Care Coordination services include support from the care team which includes your Nurse Coordinator, Clinical Social Worker, or Pharmacist.  The Care Coordination team is here to help remove barriers to the health concerns and goals most important to you. Care Coordination services are voluntary, and the patient may decline or stop services at any time by request to their care team member.   Care Coordination Consent Status: Patient agreed to services and verbal consent obtained.   Follow up plan:  Telephone appointment with care coordination team member scheduled for:  01/03/23  Encounter Outcome:  Pt. Scheduled  [facility] Coordination Care Guide  Direct Dial: [phone]

## 2023-01-03 ENCOUNTER — Ambulatory Visit: Payer: Self-pay | Admitting: *Deleted

## 2023-01-03 NOTE — Patient Outreach (Addendum)
  Care Coordination   Initial Visit Note   01/04/2023 Name: Leonard Cisneros MRN: [id number] DOB: October 13, 1940  Leonard Cisneros is a 82 y.o. year old male who sees Leonard Laroche, FNP for primary care. I spoke with  Leonard Cisneros by phone today.  What matters to the patients health and wellness today?  Initial outreach, pending home health. activity  Reports he is doing well and is outside doing chores Denies any worsening symptoms   Goals Addressed             This Visit's Progress    THn care coordination services   On track    Interventions Today    Flowsheet Row Most Recent Value  Chronic Disease   Chronic disease during today's visit Other  [chest pain, anemia, Gi bleeding]  General Interventions   General Interventions Discussed/Reviewed General Interventions Discussed, Community Resources, Doctor Visits  Doctor Visits Discussed/Reviewed PCP, Specialist  PCP/Specialist Visits Compliance with follow-up visit  Exercise Interventions   Exercise Discussed/Reviewed Exercise Discussed, Physical Activity  Physical Activity Discussed/Reviewed Physical Activity Discussed, Home Exercise Program (HEP)  Leonard Cisneros active outside of his home]  Education Interventions   Education Provided Provided Web-based Education  [ANEMIA]  Mental Health Interventions   Mental Health Discussed/Reviewed Mental Health Discussed, Coping Strategies  Pharmacy Interventions   Pharmacy Dicussed/Reviewed Pharmacy Topics Discussed, Affording Medications  Safety Interventions   Safety Discussed/Reviewed Safety Discussed, Home Safety              SDOH assessments and interventions completed:  Yes  SDOH Interventions Today    Flowsheet Row Most Recent Value  SDOH Interventions   Food Insecurity Interventions Intervention Not Indicated  Housing Interventions Intervention Not Indicated  Transportation Interventions Intervention Not Indicated  Utilities Interventions Intervention Not Indicated   Financial Strain Interventions Intervention Not Indicated  Stress Interventions Intervention Not Indicated        Care Coordination Interventions:  Yes, provided   Follow up plan: Follow up call scheduled for 01/17/23    Encounter Outcome:  Pt. Visit Completed   Emmaline Wahba L. Noelle Penner, RN, BSN, CCM [facility] Care Management Community Coordinator Office number [phone]

## 2023-01-04 ENCOUNTER — Ambulatory Visit: Payer: Medicare HMO | Admitting: Family Medicine

## 2023-01-04 ENCOUNTER — Encounter: Payer: Self-pay | Admitting: *Deleted

## 2023-01-04 NOTE — Patient Instructions (Addendum)
Visit Information  Thank you for taking time to visit with me today. Please don't hesitate to contact me if I can be of assistance to you.   Following are the goals we discussed today:   Goals Addressed             This Visit's Progress    THn care coordination services   On track    Interventions Today    Flowsheet Row Most Recent Value  Chronic Disease   Chronic disease during today's visit Other  [chest pain, anemia, Gi bleeding]  General Interventions   General Interventions Discussed/Reviewed General Interventions Discussed, Community Resources, Doctor Visits  Doctor Visits Discussed/Reviewed PCP, Specialist  PCP/Specialist Visits Compliance with follow-up visit  Exercise Interventions   Exercise Discussed/Reviewed Exercise Discussed, Physical Activity  Physical Activity Discussed/Reviewed Physical Activity Discussed, Home Exercise Program (HEP)  [name] active outside of his home]  Education Interventions   Education Provided Provided Web-based Education  [ANEMIA]  Mental Health Interventions   Mental Health Discussed/Reviewed Mental Health Discussed, Coping Strategies  Pharmacy Interventions   Pharmacy Dicussed/Reviewed Pharmacy Topics Discussed, Affording Medications  Safety Interventions   Safety Discussed/Reviewed Safety Discussed, Home Safety              Our next appointment is by telephone on 01/17/23 at 1115  Please call the care guide team at [phone] if you need to cancel or reschedule your appointment.   If you are experiencing a Mental Health or Behavioral Health Crisis or need someone to talk to, please call the Suicide and Crisis Lifeline: 988 call the [place] National Suicide Prevention Lifeline: (970)671-8740 or TTY: 817-234-4493 TTY 806 624 5196) to talk to a trained counselor call 1-800-273-TALK (toll free, 24 hour hotline) call the [facility]: 780-177-3406 call 911   Patient verbalizes understanding of instructions and  care plan provided today and agrees to view in MyChart. Active MyChart status and patient understanding of how to access instructions and care plan via MyChart confirmed with patient.     The patient has been provided with contact information for the care management team and has been advised to call with any health related questions or concerns.   Stevens Magwood L. Noelle Penner, RN, BSN, CCM [facility] Care Management Community Coordinator Office number [phone]

## 2023-01-07 ENCOUNTER — Encounter: Payer: Self-pay | Admitting: Cardiology

## 2023-01-07 NOTE — Progress Notes (Unsigned)
GI Office Note    Referring Provider: [name], FNP Primary Care Physician:  [name], FNP Primary Gastroenterologist: Gerrit Friends.Rourk, MD   Date:  01/08/2023  ID:  Leonard Cisneros, DOB [birth year], MRN [id number]   Chief Complaint   Chief Complaint  Patient presents with   Hospitalization Follow-up    Patient here today for a hosptial follow up on Anemia from 12/25/2022 hospitalization. Patient denies any shortness of breath, dizziness, chest pain, nor fatigue. Denies any dark or bloody stools recently. Patient last hgb was 12/29/2022 at 8.3 with hct at 25.1, patient received 2 units of prbc on 01/01/2023.Patient takes ferrous sulfate 325 mgonce per day. Patient says he took the last of his Carafate today and wants to know if he needs to continue if so he will need refills.   History of Present Illness  Leonard Cisneros is a 82 y.o. male with a history of A-fib on Eliquis, ischemic cardiomyopathy, HTN, CAD s/p CABG in 2003, HLD, moderate to severe aortic stenosis, known history of duodenal ulcer in March 2024 reidentified on EGD in April 2024 presenting today for hospital follow-up..  Colonoscopy in January 2019: -entire colon normal -no repeat colonoscopy due to age   Abdominal US 09/24/22 IMPRESSION: -Cholelithiasis without evidence of acute cholecystitis or biliary dilatation. -Probable fatty infiltration of liver as above. -RIGHT pleural effusion.   Patient originally presented to [facility] with shortness of breath, weight gain, edema, and melena and was transferred to [facility] for GI evaluation.  His hemoglobin at that time was 7.8.  He was started on Lasix for CHF exacerbation.  Anemia panel was unremarkable.  He was given 1 unit of PRBCs while inpatient and started on PPI.  He underwent EGD as noted below foe evaluation of heme positive stool and melena.    EGD 11/21/2022 at [facility]: -Z-line regular -Normal esophagus -Gastritis s/p biopsy -Nonbleeding duodenal  ulcer with clean ulcer base -Mucosal nodule in the duodenum s/p biopsy -Duodenal nodule with polypoid fragment of benign small bowel mucosa with focal foveolar metaplasia suggestive of peptic injury -Gastric biopsies negative for H. pylori or intestinal metaplasia. -Advised to avoid NSAIDs, resume Eliquis in 3 days and use PPI twice daily for 3 months.  Last office visit 12/20/22. Denied melena, abdominal pain, reflux, dysphagia, nausea, vomiting, NSAIDs, BRBPR, diarrhea.  Reportedly has lost weight since December due to lack of appetite.  Happy with weight loss.  Unsure if some of his weight loss is secondary to diuretics for swelling.  Initially had some constipation after hospitalization however began to have daily bowel movements recently.  Was advised to recheck CBC and HFP in 1 week, continue iron daily, continue PPI twice daily for 8 weeks and then reduce to once daily.  Advised to continue current supply of Carafate and then stop.  Pending results of HFP would obtain CT imaging of liver and gallbladder.  Recent admission at [facility] for chest pain and found to have significant symptomatic anemia with hemoglobin of 5 and heme positive stool.  Underwent EGD as outlined below.  Also treated with thoracentesis for improvement of shortness of breath while inpatient -removal 1.5 L.  Also had right upper quadrant ultrasound as outlined below.  Elevated liver enzymes while inpatient suspected to be secondary to congestive hepatopathy.  Advised to continue to follow liver enzymes and keep follow-up outpatient with cardiology.  Advise need for CBC 1 week after discharge.  Hemoglobin 8.3 on discharge.  AST 51, ALT 49, alk phos  243, T. bili 3.7 with indirect 2.4 and direct 1.3 on discharge.  RUQ [place] 12/25/2022: -Cholelithiasis without additional changes to suggest acute cholecystitis -Right pleural effusion -Patent portal vein  EGD 12/27/22: -Mild Schatzki's ring -Gastritis -Nonbleeding duodenal ulcer with  clean ulcer base -Advised IV PPI while inpatient and DC on p.o. PPI twice daily.  May resume Eliquis  Today:  Feeling the best he has in a long time. Was out doing yard work this morning planting flowers and getting things together for a yard sale to sell some of his power tools. Has been using his crutches to help get around.and is wearing his back brace.   Appetite is still so-so but still eats 3 meals per day. Eats a snack at least once per day as well in the afternoon. Taking his iron once daily. No melena or brbpr.   Breathing is good. No issues sleeping either. No chest pain. Energy level is improved. No abdominal pain, N/V, dysphagia.   Bowels are about back to normal now. Typically having a BM everyday or every other day. No diarrhea or hard stools.    Wt Readings from Last 3 Encounters:  01/08/23 195 lb (88.5 kg)  12/31/22 195 lb (88.5 kg)  12/29/22 195 lb 1.7 oz (88.5 kg)     Current Outpatient Medications  Medication Sig Dispense Refill   atorvastatin (LIPITOR) 80 MG tablet TAKE 1 TABLET BY MOUTH EVERY DAY (Patient taking differently: Take 80 mg by mouth daily.) 90 tablet 1   Iron, Ferrous Sulfate, 325 (65 Fe) MG TABS Take 325 mg by mouth daily. 30 tablet 2   KLOR-CON M10 10 MEQ tablet TAKE 3 TABLETS BY MOUTH DAILY. 270 tablet 1   metoprolol succinate (TOPROL-XL) 25 MG 24 hr tablet Take 1 tablet (25 mg total) by mouth daily. 90 tablet 1   nitroGLYCERIN (NITROSTAT) 0.4 MG SL tablet Place 1 tablet (0.4 mg total) under the tongue every 5 (five) minutes x 3 doses as needed for chest pain (if no relief after 3rd dose, proceed to the ED for an evaluation or call 911). 25 tablet 3   pantoprazole (PROTONIX) 40 MG tablet Take 1 tablet (40 mg total) by mouth 2 (two) times daily. 180 tablet 0   torsemide (DEMADEX) 20 MG tablet Take 3 tablets (60 mg total) by mouth daily. 270 tablet 3   sucralfate (CARAFATE) 1 GM/10ML suspension Take 10 mLs (1 g total) by mouth in the morning, at noon,  and at bedtime. (Patient not taking: Reported on 01/08/2023) 420 mL 2   No current facility-administered medications for this visit.    Past Medical History:  Diagnosis Date   Aortic stenosis    Atrial flutter (HCC)    a. s/p remote ablation by Dr. Ladona Ridgel.   CAD (coronary artery disease)    a. s/p CABG 2003. b. 01/2016: unstable angina - s/p BMS to SVG-ramus intermediate, patent LIMA-mLAD, patent RIMA-dRCA.    Chronic lower back pain    Essential hypertension    Hyperlipidemia    Ischemic cardiomyopathy    a. Cath 01/2016 -  EF 50-55% with mild distal inferior hypocontractility.   Peripheral neuropathy 04/04/2014   Permanent atrial fibrillation [facility])     Past Surgical History:  Procedure Laterality Date   BACK SURGERY     BIOPSY  11/21/2022   Procedure: BIOPSY;  Surgeon: Napoleon Form, MD;  Location: [facility] END[facility];  Service: Gastroenterology;;   CARDIAC CATHETERIZATION  ~ 2000; 2003   CARDIAC CATHETERIZATION N/A  02/01/2016   Procedure: Left Heart Cath and Cors/Grafts Angiography;  Surgeon: Lennette Bihari, MD;  Location: [facility] INVASIVE CV LAB;  Service: Cardiovascular;  Laterality: N/A;   CARDIAC CATHETERIZATION N/A 02/01/2016   Procedure: Coronary Stent Intervention;  Surgeon: Lennette Bihari, MD;  Location: [facility] INVASIVE CV LAB;  Service: Cardiovascular;  Laterality: N/A;   CATARACT EXTRACTION W/PHACO Left 02/09/2019   Procedure: CATARACT EXTRACTION PHACO AND INTRAOCULAR LENS PLACEMENT (IOC);  Surgeon: Fabio Pierce, MD;  Location: [facility] ORS;  Service: Ophthalmology;  Laterality: Left;  CDE: 27.70   CATARACT EXTRACTION W/PHACO Right 02/23/2019   Procedure: CATARACT EXTRACTION PHACO AND INTRAOCULAR LENS PLACEMENT RIGHT EYE;  Surgeon: Fabio Pierce, MD;  Location: [facility] ORS;  Service: Ophthalmology;  Laterality: Right;  CDE: 10.15   COLONOSCOPY  06/04/2002   Normal colon and rectum   COLONOSCOPY  07/08/2012   Procedure: COLONOSCOPY;  Surgeon: Corbin Ade, MD;  Location: [facility];   Service: Endoscopy;  Laterality: N/A;  11:30   COLONOSCOPY N/A 10/02/2017   Procedure: COLONOSCOPY;  Surgeon: Corbin Ade, MD;  Location: [facility];  Service: Endoscopy;  Laterality: N/A;  10:30am   CORONARY ANGIOPLASTY WITH STENT PLACEMENT  02/01/2016   CORONARY ARTERY BYPASS GRAFT  2003   LIMA to LAD,LIMA to distal RCA,SVG to ramus intermediate vessel.   ESOPHAGOGASTRODUODENOSCOPY (EGD) WITH PROPOFOL N/A 11/21/2022   Procedure: ESOPHAGOGASTRODUODENOSCOPY (EGD) WITH PROPOFOL;  Surgeon: Napoleon Form, MD;  Location: [facility];  Service: Gastroenterology;  Laterality: N/A;   ESOPHAGOGASTRODUODENOSCOPY (EGD) WITH PROPOFOL N/A 12/27/2022   Procedure: ESOPHAGOGASTRODUODENOSCOPY (EGD) WITH PROPOFOL;  Surgeon: Lanelle Bal, DO;  Location: [facility];  Service: Endoscopy;  Laterality: N/A;   FEMORAL REVISION Right 03/30/2019   Procedure: Femoral revision right total hip arthroplasty;  Surgeon: Ollen Gross, MD;  Location: [facility] OR[facility];  Service: Orthopedics;  Laterality: Right;    FOREIGN BODY REMOVAL Right 04/02/2014   Procedure: FOREIGN BODY REMOVAL RIGHT FOOT;  Surgeon: Dalia Heading, MD;  Location: [facility];  Service: General;  Laterality: Right;   JOINT REPLACEMENT     JOINT REPLACEMENT     LUMBAR DISC SURGERY     REVISION TOTAL HIP ARTHROPLASTY Bilateral 2005-2012   right-left   RIGHT/LEFT HEART CATH AND CORONARY/GRAFT ANGIOGRAPHY N/A 09/27/2022   Procedure: RIGHT/LEFT HEART CATH AND CORONARY/GRAFT ANGIOGRAPHY;  Surgeon: [place], Peter M, MD;  Location: [facility] INVASIVE CV LAB;  Service: Cardiovascular;  Laterality: N/A;   SHOULDER OPEN ROTATOR CUFF REPAIR Right    TOTAL HIP ARTHROPLASTY Right 1999   TOTAL HIP ARTHROPLASTY Left 2008   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    Family  History  Problem Relation Age of Onset   Heart attack Brother    Colon cancer Neg Hx     Allergies as of 01/08/2023 - Review Complete 01/08/2023  Allergen Reaction Noted   Augmentin [amoxicillin-pot clavulanate] Nausea And Vomiting and Other (See Comments) 01/14/2013    Social History   Socioeconomic History   Marital status: Married    Spouse name: Larita Fife   Number of children: 2   Years of education: Not on file   Highest education level: Bachelor's degree (e.g., BA, AB, BS)  Occupational History   Occupation: PT Holiday representative: RETIRED  Tobacco Use   Smoking status: Former  Packs/day: 0.50    Years: 2.00    Additional pack years: 0.00    Total pack years: 1.00    Types: Cigarettes    Quit date: 09/03/1966    Years since quitting: 56.3   Smokeless tobacco: Never  Vaping Use   Vaping Use: Never used  Substance and Sexual Activity   Alcohol use: Yes    Alcohol/week: 11.0 standard drinks of alcohol    Types: 7 Glasses of wine, 4 Cans of beer per week    Comment: a glass of wine with dinner/ beer on the weekend   Drug use: No   Sexual activity: Not on file  Other Topics Concern   Not on file  Social History Narrative   Lives with his wife.   Social Determinants of Health   Financial Resource Strain: Low Risk  (01/04/2023)   Overall Financial Resource Strain (CARDIA)    Difficulty of Paying Living Expenses: Not hard at all  Food Insecurity: No Food Insecurity (01/04/2023)   Hunger Vital Sign    Worried About Running Out of Food in the Last Year: Never true    Ran Out of Food in the Last Year: Never true  Transportation Needs: No Transportation Needs (01/04/2023)   PRAPARE - Administrator, Civil Service (Medical): No    Lack of Transportation (Non-Medical): No  Physical Activity: Unknown (11/25/2022)   Exercise Vital Sign    Days of Exercise per Week: 0 days    Minutes of Exercise per Session: Not on file  Stress: No Stress Concern  Present (01/04/2023)   [employer] of Occupational Health - Occupational Stress Questionnaire    Feeling of Stress : Not at all  Social Connections: Moderately Integrated (11/25/2022)   Social Connection and Isolation Panel [NHANES]    Frequency of Communication with Friends and Family: Once a week    Frequency of Social Gatherings with Friends and Family: Once a week    Attends Religious Services: More than 4 times per year    Active Member of [employer] or Organizations: Yes    Attends Engineer, structural: More than 4 times per year    Marital Status: Married   Review of Systems   Gen: Denies fever, chills, anorexia. Denies fatigue, weakness, weight loss.  CV: Denies chest pain, palpitations, syncope, peripheral edema, and claudication. Resp: Denies dyspnea at rest, cough, wheezing, coughing up blood, and pleurisy. GI: See HPI Derm: Denies rash, itching, dry skin Psych: Denies depression, anxiety, memory loss, confusion. No homicidal or suicidal ideation.  Heme: Denies bruising, bleeding, and enlarged lymph nodes.  Physical Exam   BP 113/61 (BP Location: Left Arm, Patient Position: Sitting, Cuff Size: Normal)   Pulse (!) 114   Temp (!) 97.3 F (36.3 C) (Temporal)   Ht 5\' 11"  (1.803 m)   Wt 195 lb (88.5 kg)   BMI 27.20 kg/m   General:   Alert and oriented. No distress noted. Pleasant and cooperative.  Head:  Normocephalic and atraumatic. Eyes:  Conjuctiva clear without scleral icterus. Mouth:  Oral mucosa pink and moist. Good dentition. No lesions. Lungs:  Clear to auscultation bilaterally. No wheezes, rales, or rhonchi. No distress.  Heart: Irregularly irregular Abdomen:  +BS, soft, non-tender and non-distended. No rebound or guarding. No HSM or masses noted. Rectal: deferred Msk: Unsteady gait, back brace present.  Using bilateral crutches. Extremities:  Without edema. Neurologic:  Alert and  oriented x4 Psych:  Alert and cooperative. Normal mood and  affect.  Assessment  Leonard Cisneros is a 82 y.o. male with a history of A-fib on Eliquis, ischemic cardiomyopathy, HTN, CAD s/p CABG in 2003, HLD, moderate to severe aortic stenosis, known history of duodenal ulcer in March 2024 reidentified on EGD in April 2024 presenting today for hospital follow-up..  Anemia, duodenal ulcer: Recent hospitalization with hemoglobin of 5, presenting with chest pain and heme positive stool.  EGD in March with nonbleeding duodenal ulcer.  Has been on PPI twice daily as well as Carafate which she inadvertently stopped just prior to presenting to the hospital although he was previously advised to continue.  He denies any recent NSAID use.  He underwent repeat EGD while inpatient with evidence of mild Schatzki's ring, gastritis, nonbleeding duodenal ulcer with clean ulcer base that had reduced in size from prior.  He was treated with IV PPI while inpatient.  Previously on Eliquis but recently discontinued by cardiology.  He received at least 2 units PRBCs while inpatient.  He was discharged home with plan to continue PPI twice daily as well as Carafate suspension.  Advised him to continue this plan for which she will continue PPI twice daily for at least 8 more weeks prior to reducing to once daily.  He will also continue Carafate for 4 weeks and will need regular monitoring of his hemoglobin.  Will recheck hemoglobin today.  Transaminitis, hyperbilirubinemia, and elevated alk phos: RUQ [place] with cholelithiasis without cholecystitis or biliary ductal dilation in January and again during most recent hospitalization.  Patent portal vein.  Elevation while inpatient likely secondary to congestive hepatopathy given cardiomyopathy and pleural effusion.  Will recheck LFTs today to assess for improvement, if no improvement will check additional serologies. On discharge AST 51, ALT 49, alk phos 243, T. bili 3.7 with indirect 2.4 and direct 1.3.  PLAN   CBC and CMP Plan to perform  additional serologies if no improvement in liver enzymes since discharge. Continue daily iron Continue pantoprazole 40 mg twice daily until 7/4 and then reduce to once daily.  Continue Carafate 1 g 3 times daily for 4 weeks and then stop. Follow-up in 3 months    Brooke Bonito, MSN, FNP-BC, AGACNP-BC [facility]

## 2023-01-07 NOTE — Telephone Encounter (Signed)
Patient reports he has swelling but he's not concerned and says swelling isn't any worse than 12/31/2022 visit. Spoke with wife who sent mychart message. Wife reports swelling in lower legs is worse than 12/31/2022 visit.. Wife reports legs are also pink. Denies pain, drainage or hot feeling. Reports weighing daily and has no weight gain. Denies SOB, dizziness or chest pain. Denies change in diet or increase in fluids. Reports swelling in both legs are about the same and was noticed upon waking up this morning. Reports having a busy weekend with ride to [facility] for a wedding. Advised this could be cause for extra swelling in LE. Says patient does not have compression stockings. Advised to keep legs elevated and call office end of week if swelling doesn't improve. Verbalized understanding of plan.

## 2023-01-08 ENCOUNTER — Ambulatory Visit (INDEPENDENT_AMBULATORY_CARE_PROVIDER_SITE_OTHER): Payer: Medicare HMO | Admitting: Gastroenterology

## 2023-01-08 ENCOUNTER — Other Ambulatory Visit: Payer: Self-pay | Admitting: Family Medicine

## 2023-01-08 ENCOUNTER — Encounter: Payer: Self-pay | Admitting: Gastroenterology

## 2023-01-08 VITALS — BP 113/61 | HR 114 | Temp 97.3°F | Ht 71.0 in | Wt 195.0 lb

## 2023-01-08 DIAGNOSIS — D649 Anemia, unspecified: Secondary | ICD-10-CM | POA: Diagnosis not present

## 2023-01-08 DIAGNOSIS — K269 Duodenal ulcer, unspecified as acute or chronic, without hemorrhage or perforation: Secondary | ICD-10-CM | POA: Diagnosis not present

## 2023-01-08 DIAGNOSIS — R748 Abnormal levels of other serum enzymes: Secondary | ICD-10-CM | POA: Diagnosis not present

## 2023-01-08 DIAGNOSIS — R7989 Other specified abnormal findings of blood chemistry: Secondary | ICD-10-CM

## 2023-01-08 NOTE — Patient Instructions (Addendum)
Continue pantoprazole 40 mg twice daily until July 4 and then you may reduce to once daily.  Continue taking the Carafate 1 g 3 times daily for the next 4 weeks then you may stop.  I am checking your blood counts as well as your liver enzymes to make sure there is an improvement.  If you continue to have elevation in your liver enzymes there may be some additional blood work that we need to perform.  We will follow-up in 3 months, sooner if needed.  Please not hesitate to call or reach out via your MyChart if you have any questions or concerns.  It was a pleasure to see you today. I want to create trusting relationships with patients. If you receive a survey regarding your visit,  I greatly appreciate you taking time to fill this out on paper or through your MyChart. I value your feedback.  Brooke Bonito, MSN, FNP-BC, AGACNP-BC [facility]

## 2023-01-09 ENCOUNTER — Encounter: Payer: Self-pay | Admitting: Gastroenterology

## 2023-01-10 ENCOUNTER — Encounter: Payer: Self-pay | Admitting: Family Medicine

## 2023-01-10 ENCOUNTER — Ambulatory Visit (INDEPENDENT_AMBULATORY_CARE_PROVIDER_SITE_OTHER): Payer: Medicare HMO | Admitting: Family Medicine

## 2023-01-10 VITALS — BP 100/63 | HR 112 | Ht 71.0 in | Wt 195.0 lb

## 2023-01-10 DIAGNOSIS — R7989 Other specified abnormal findings of blood chemistry: Secondary | ICD-10-CM | POA: Diagnosis not present

## 2023-01-10 DIAGNOSIS — I872 Venous insufficiency (chronic) (peripheral): Secondary | ICD-10-CM | POA: Diagnosis not present

## 2023-01-10 DIAGNOSIS — D649 Anemia, unspecified: Secondary | ICD-10-CM

## 2023-01-10 DIAGNOSIS — K269 Duodenal ulcer, unspecified as acute or chronic, without hemorrhage or perforation: Secondary | ICD-10-CM | POA: Diagnosis not present

## 2023-01-10 LAB — CBC: Platelets: 194 10*3/uL (ref 140–400)

## 2023-01-10 MED ORDER — TORSEMIDE 20 MG PO TABS
60.0000 mg | ORAL_TABLET | Freq: Every day | ORAL | 3 refills | Status: DC
Start: 2023-01-10 — End: 2023-01-29

## 2023-01-10 NOTE — Patient Instructions (Addendum)
I appreciate the opportunity to provide care to you today!    Follow up:  2 weeks  Stasis dermatitis of the lower extremities  -Please start taking Torsemide 60 mg daily     Recommend conservative managements to help decrease swelling   -Reduce salt (sodium) in your diet -- Sodium, which is found in table salt and processed foods, can worsen edema. Reducing the amount of salt you consume can help to reduce edema, especially if you also take a diuretic.  -Compression stockings -- Leg edema can be prevented and treated with the use of compression stockings. Stockings are available in several heights, including knee-high, thigh-high, and pantyhose. Knee-high stockings are sufficient for most patients. - Body positioning -- Leg, ankle, and foot edema can be improved by elevating the legs above heart level for 30 minutes three or four times per day. Elevating the legs may be sufficient to reduce or eliminate edema      Please continue to a heart-healthy diet and increase your physical activities. Try to exercise for at least five days a week.      It was a pleasure to see you and I look forward to continuing to work together on your health and well-being. Please do not hesitate to call the office if you need care or have questions about your care.   Have a wonderful day and week. With Gratitude, [name] MSN, FNP-BC

## 2023-01-10 NOTE — Progress Notes (Signed)
Established Patient Office Visit  Subjective:  Patient ID: Leonard Cisneros, male    DOB: 31-Oct-1940  Age: 82 y.o. MRN: [id number]  CC:  Chief Complaint  Patient presents with   Follow-up    Hospital f/u d/c on 04/27, has already followed up with GI. Pt reports feeling well.     HPI Leonard Cisneros is a 82 y.o. male with past medical history of  f A-fib on Eliquis, ischemic cardiomyopathy, HTN, CAD s/p CABG in 2003, HLD, moderate to severe aortic stenosis, known history of duodenal ulcer in March 2024 reidentified on EGD in April 2024 presenting today for hospital follow-up.  The patient voices no complaints or concerns today noting that he is feeling well.  Denies symptoms of fatigue, lightheadedness, chest pain, palpitation, shortness of breath, dizziness and orthopnea.  He has follow-up with cardiology and GI since his discharge from the hospital.  He is currently taking Protonix 40 mg twice daily until 03/07/2023 and sacral fate 1 g 3 times daily for 4 weeks.  No complaints or concerns voiced.  No fever or chills reported.  Past Medical History:  Diagnosis Date   Aortic stenosis    Atrial flutter (HCC)    a. s/p remote ablation by Dr. Ladona Ridgel.   CAD (coronary artery disease)    a. s/p CABG 2003. b. 01/2016: unstable angina - s/p BMS to SVG-ramus intermediate, patent LIMA-mLAD, patent RIMA-dRCA.    Chronic lower back pain    Essential hypertension    Hyperlipidemia    Ischemic cardiomyopathy    a. Cath 01/2016 -  EF 50-55% with mild distal inferior hypocontractility.   Peripheral neuropathy 04/04/2014   Permanent atrial fibrillation [facility])     Past Surgical History:  Procedure Laterality Date   BACK SURGERY     BIOPSY  11/21/2022   Procedure: BIOPSY;  Surgeon: Napoleon Form, MD;  Location: [facility] ENDOS[facility];  Service: Gastroenterology;;   CARDIAC CATHETERIZATION  ~ 2000; 2003   CARDIAC CATHETERIZATION N/A 02/01/2016   Procedure: Left Heart Cath and Cors/Grafts Angiography;   Surgeon: Lennette Bihari, MD;  Location: [facility] INVAS[facility] LAB;  Service: Cardiovascular;  Laterality: N/A;   CARDIAC CATHETERIZATION N/A 02/01/2016   Procedure: Coronary Stent Intervention;  Surgeon: Lennette Bihari, MD;  Location: [facility];  Service: Cardiovascular;  Laterality: N/A;   CATARACT EXTRACTION W/PHACO Left 02/09/2019   Procedure: CATARACT EXTRACTION PHACO AND INTRAOCULAR LENS PLACEMENT (IOC);  Surgeon: Fabio Pierce, MD;  Location: [facility] ORS;  Service: Ophthalmology;  Laterality: Left;  CDE: 27.70   CATARACT EXTRACTION W/PHACO Right 02/23/2019   Procedure: CATARACT EXTRACTION PHACO AND INTRAOCULAR LENS PLACEMENT RIGHT EYE;  Surgeon: Fabio Pierce, MD;  Location: [facility] ORS;  Service: Ophthalmology;  Laterality: Right;  CDE: 10.15   COLONOSCOPY  06/04/2002   Normal colon and rectum   COLONOSCOPY  07/08/2012   Procedure: COLONOSCOPY;  Surgeon: Corbin Ade, MD;  Location: [facility];  Service: Endoscopy;  Laterality: N/A;  11:30   COLONOSCOPY N/A 10/02/2017   Procedure: COLONOSCOPY;  Surgeon: Corbin Ade, MD;  Location: [facility];  Service: Endoscopy;  Laterality: N/A;  10:30am   CORONARY ANGIOPLASTY WITH STENT PLACEMENT  02/01/2016   CORONARY ARTERY BYPASS GRAFT  2003   LIMA to LAD,LIMA to distal RCA,SVG to ramus intermediate vessel.   ESOPHAGOGASTRODUODENOSCOPY (EGD) WITH PROPOFOL N/A 11/21/2022   Procedure: ESOPHAGOGASTRODUODENOSCOPY (EGD) WITH PROPOFOL;  Surgeon: Napoleon Form, MD;  Location: [facility];  Service: Gastroenterology;  Laterality: N/A;   ESOPHAGOGASTRODUODENOSCOPY (EGD) WITH PROPOFOL N/A 12/27/2022   Procedure: ESOPHAGOGASTRODUODENOSCOPY (EGD) WITH PROPOFOL;  Surgeon: Lanelle Bal, DO;  Location: [facility];  Service: Endoscopy;  Laterality: N/A;   FEMORAL REVISION Right 03/30/2019   Procedure: Femoral revision right total hip arthroplasty;  Surgeon: Ollen Gross, MD;  Location: [facility] ORS;  Service: Orthopedics;  Laterality: Right;     FOREIGN BODY REMOVAL Right 04/02/2014   Procedure: FOREIGN BODY REMOVAL RIGHT FOOT;  Surgeon: Dalia Heading, MD;  Location: [facility];  Service: General;  Laterality: Right;   JOINT REPLACEMENT     JOINT REPLACEMENT     LUMBAR DISC SURGERY     REVISION TOTAL HIP ARTHROPLASTY Bilateral 2005-2012   right-left   RIGHT/LEFT HEART CATH AND CORONARY/GRAFT ANGIOGRAPHY N/A 09/27/2022   Procedure: RIGHT/LEFT HEART CATH AND CORONARY/GRAFT ANGIOGRAPHY;  Surgeon: [place], Peter M, MD;  Location[facility];  Service: Cardiovascular;  Laterality: N/A;   SHOULDER OPEN ROTATOR CUFF REPAIR Right    TOTAL HIP ARTHROPLASTY Right 1999   TOTAL HIP ARTHROPLASTY Left 2008   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    Family History  Problem Relation Age of Onset   Heart attack Brother    Colon cancer Neg Hx     Social History   Socioeconomic History   Marital status: Married    Spouse name: Larita Fife   Number of children: 2   Years of education: Not on file   Highest education level: Bachelor's degree (e.g., BA, AB, BS)  Occupational History   Occupation: PT Holiday representative: RETIRED  Tobacco Use   Smoking status: Former    Packs/day: 0.50    Years: 2.00    Additional pack years: 0.00    Total pack years: 1.00    Types: Cigarettes    Quit date: 09/03/1966    Years since quitting: 56.3   Smokeless tobacco: Never  Vaping Use   Vaping Use: Never used  Substance and Sexual Activity   Alcohol use: Yes    Alcohol/week: 11.0 standard drinks of alcohol    Types: 7 Glasses of wine, 4 Cans of beer per week    Comment: a glass of wine with dinner/ beer on the weekend   Drug use: No   Sexual activity: Not on file  Other Topics Concern   Not on file  Social History Narrative   Lives with his wife.    Social Determinants of Health   Financial Resource Strain: Low Risk  (01/04/2023)   Overall Financial Resource Strain (CARDIA)    Difficulty of Paying Living Expenses: Not hard at all  Food Insecurity: No Food Insecurity (01/04/2023)   Hunger Vital Sign    Worried About Running Out of Food in the Last Year: Never true    Ran Out of Food in the Last Year: Never true  Transportation Needs: No Transportation Needs (01/04/2023)   PRAPARE - Administrator, Civil Service (Medical): No    Lack of Transportation (Non-Medical): No  Physical Activity: Unknown (11/25/2022)   Exercise Vital Sign    Days of Exercise per Week: 0 days    Minutes of Exercise per Session: Not on file  Stress: No Stress Concern Present (01/04/2023)   [employer] of Occupational Health - Occupational Stress  Questionnaire    Feeling of Stress : Not at all  Social Connections: Moderately Integrated (11/25/2022)   Social Connection and Isolation Panel [NHANES]    Frequency of Communication with Friends and Family: Once a week    Frequency of Social Gatherings with Friends and Family: Once a week    Attends Religious Services: More than 4 times per year    Active Member of [employer] or Organizations: Yes    Attends Banker Meetings: More than 4 times per year    Marital Status: Married  Catering manager Violence: Not At Risk (12/25/2022)   Humiliation, Afraid, Rape, and Kick questionnaire    Fear of Current or Ex-Partner: No    Emotionally Abused: No    Physically Abused: No    Sexually Abused: No    Outpatient Medications Prior to Visit  Medication Sig Dispense Refill   atorvastatin (LIPITOR) 80 MG tablet TAKE 1 TABLET BY MOUTH EVERY DAY (Patient taking differently: Take 80 mg by mouth daily.) 90 tablet 1   Iron, Ferrous Sulfate, 325 (65 Fe) MG TABS Take 325 mg by mouth daily. 30 tablet 2   KLOR-CON M10 10 MEQ tablet TAKE 3 TABLETS BY MOUTH DAILY. 270 tablet 1   metoprolol succinate (TOPROL-XL)  25 MG 24 hr tablet Take 1 tablet (25 mg total) by mouth daily. 90 tablet 1   nitroGLYCERIN (NITROSTAT) 0.4 MG SL tablet Place 1 tablet (0.4 mg total) under the tongue every 5 (five) minutes x 3 doses as needed for chest pain (if no relief after 3rd dose, proceed to the ED for an evaluation or call 911). 25 tablet 3   pantoprazole (PROTONIX) 40 MG tablet Take 1 tablet (40 mg total) by mouth 2 (two) times daily. 180 tablet 0   sucralfate (CARAFATE) 1 GM/10ML suspension TAKE 10 MLS (1 G TOTAL) BY MOUTH 4 (FOUR) TIMES DAILY - WITH MEALS AND AT BEDTIME. 420 mL 2   torsemide (DEMADEX) 20 MG tablet Take 3 tablets (60 mg total) by mouth daily. 270 tablet 3   No facility-administered medications prior to visit.    Allergies  Allergen Reactions   Augmentin [Amoxicillin-Pot Clavulanate] Nausea And Vomiting and Other (See Comments)    Has patient had a PCN reaction causing immediate rash, facial/tongue/throat swelling, SOB or lightheadedness with hypotension: Unknown Has patient had a PCN reaction causing severe rash involving mucus membranes or skin necrosis: No Has patient had a PCN reaction that required hospitalization: No Has patient had a PCN reaction occurring within the last 10 years: No If all of the above answers are "NO", then may proceed with Cephalosporin use.     ROS Review of Systems  Constitutional:  Negative for fatigue and fever.  Eyes:  Negative for visual disturbance.  Respiratory:  Negative for chest tightness and shortness of breath.   Cardiovascular:  Negative for chest pain and palpitations.  Neurological:  Negative for dizziness and headaches.      Objective:    Physical Exam Musculoskeletal:     Right lower leg: 3+ Edema present.     Left lower leg: 3+ Edema present.     Comments: No redness, warmth, and pain noted with ambulation Sensation intact bilaterally Range of motion intact +2 posterior tibia and dorsalis pedis pulses     BP 100/63   Pulse (!) 112    Ht 5\' 11"  (1.803 m)   Wt 195 lb 0.6 oz (88.5 kg)   SpO2 92%   BMI 27.20 kg/m  Wt Readings from Last 3 Encounters:  01/10/23 195 lb 0.6 oz (88.5 kg)  01/08/23 195 lb (88.5 kg)  12/31/22 195 lb (88.5 kg)    Lab Results  Component Value Date   TSH 3.009 11/20/2022   Lab Results  Component Value Date   WBC 4.9 01/10/2023   HGB 7.2 (L) 01/10/2023   HCT 21.2 (L) 01/10/2023   MCV 101.0 (H) 01/10/2023   PLT 194 01/10/2023   Lab Results  Component Value Date   NA 139 01/10/2023   K 3.5 01/10/2023   CO2 27 01/10/2023   GLUCOSE 106 (H) 01/10/2023   BUN 15 01/10/2023   CREATININE 1.01 01/10/2023   BILITOT 2.3 (H) 01/10/2023   ALKPHOS 243 (H) 12/29/2022   AST 38 (H) 01/10/2023   ALT 34 01/10/2023   PROT 5.4 (L) 01/10/2023   ALBUMIN 2.6 (L) 12/29/2022   CALCIUM 8.3 (L) 01/10/2023   ANIONGAP 7 12/27/2022   EGFR 79 11/29/2022   Lab Results  Component Value Date   CHOL 110 11/12/2021   Lab Results  Component Value Date   HDL 74 11/12/2021   Lab Results  Component Value Date   LDLCALC 31 11/12/2021   Lab Results  Component Value Date   TRIG 23 11/12/2021   Lab Results  Component Value Date   CHOLHDL 1.5 11/12/2021   Lab Results  Component Value Date   HGBA1C 4.6 (L) 11/18/2022      Assessment & Plan:  Severe anemia Assessment & Plan: Labs and imaging reviewed Patient is asymptomatic in the clinic Most recent hemoglobin on 01/10/2019 2047.2 Reports compliance with p.o. iron Encouraged to continue on therapy, will continue to monitor Encouraged to notify NP for severe symptoms of anemia, including dizziness, lightheadedness, fatigue, headaches, and irritability Understanding verbalized   Stasis dermatitis of both legs Assessment & Plan: Notable 3+ edema noted on the lower extremities Low suspicion for cellulitis and DVT No warmth, redness, or pain with ambulation Patient is unsure if he has been taking torsemide 60 mg daily as prescribed Will reinstate  therapy and follow-up in 2 weeks Conservative management encouraged  Orders: -     Torsemide; Take 3 tablets (60 mg total) by mouth daily.  Dispense: 270 tablet; Refill: 3   Note: This chart has been completed using Engineer, civil (consulting) software, and while attempts have been made to ensure accuracy, certain words and phrases may not be transcribed as intended.    Follow-up: Return in about 2 weeks (around 01/24/2023).   [name], FNP

## 2023-01-11 ENCOUNTER — Encounter: Payer: Self-pay | Admitting: Family Medicine

## 2023-01-11 ENCOUNTER — Ambulatory Visit: Payer: Medicare HMO | Admitting: Family Medicine

## 2023-01-11 DIAGNOSIS — I872 Venous insufficiency (chronic) (peripheral): Secondary | ICD-10-CM | POA: Insufficient documentation

## 2023-01-11 LAB — COMPREHENSIVE METABOLIC PANEL
AG Ratio: 1.3 (calc) (ref 1.0–2.5)
ALT: 34 U/L (ref 9–46)
AST: 38 U/L — ABNORMAL HIGH (ref 10–35)
Albumin: 3 g/dL — ABNORMAL LOW (ref 3.6–5.1)
Alkaline phosphatase (APISO): 359 U/L — ABNORMAL HIGH (ref 35–144)
BUN: 15 mg/dL (ref 7–25)
CO2: 27 mmol/L (ref 20–32)
Calcium: 8.3 mg/dL — ABNORMAL LOW (ref 8.6–10.3)
Chloride: 103 mmol/L (ref 98–110)
Creat: 1.01 mg/dL (ref 0.70–1.22)
Globulin: 2.4 g/dL (calc) (ref 1.9–3.7)
Glucose, Bld: 106 mg/dL — ABNORMAL HIGH (ref 65–99)
Potassium: 3.5 mmol/L (ref 3.5–5.3)
Sodium: 139 mmol/L (ref 135–146)
Total Bilirubin: 2.3 mg/dL — ABNORMAL HIGH (ref 0.2–1.2)
Total Protein: 5.4 g/dL — ABNORMAL LOW (ref 6.1–8.1)

## 2023-01-11 LAB — CBC
HCT: 21.2 % — ABNORMAL LOW (ref 38.5–50.0)
Hemoglobin: 7.2 g/dL — ABNORMAL LOW (ref 13.2–17.1)
MCH: 34.3 pg — ABNORMAL HIGH (ref 27.0–33.0)
MCHC: 34 g/dL (ref 32.0–36.0)
MCV: 101 fL — ABNORMAL HIGH (ref 80.0–100.0)
MPV: 11.9 fL (ref 7.5–12.5)
RBC: 2.1 10*6/uL — ABNORMAL LOW (ref 4.20–5.80)
RDW: 19.7 % — ABNORMAL HIGH (ref 11.0–15.0)
WBC: 4.9 10*3/uL (ref 3.8–10.8)

## 2023-01-11 NOTE — Assessment & Plan Note (Addendum)
Notable 3+ edema noted on the lower extremities Low suspicion for cellulitis and DVT No warmth, redness, or pain with ambulation Patient is unsure if he has been taking torsemide 60 mg daily as prescribed Will reinstate therapy and follow-up in 2 weeks Conservative management encouraged

## 2023-01-11 NOTE — Assessment & Plan Note (Addendum)
Labs and imaging reviewed Patient is asymptomatic in the clinic Most recent hemoglobin on 01/10/2019 2047.2 Reports compliance with p.o. iron Encouraged to continue on therapy, will continue to monitor Encouraged to notify NP for severe symptoms of anemia, including dizziness, lightheadedness, fatigue, headaches, and irritability Understanding verbalized

## 2023-01-14 ENCOUNTER — Other Ambulatory Visit: Payer: Self-pay | Admitting: Gastroenterology

## 2023-01-14 DIAGNOSIS — D649 Anemia, unspecified: Secondary | ICD-10-CM

## 2023-01-16 DIAGNOSIS — D649 Anemia, unspecified: Secondary | ICD-10-CM | POA: Diagnosis not present

## 2023-01-17 ENCOUNTER — Ambulatory Visit: Payer: Self-pay | Admitting: *Deleted

## 2023-01-17 ENCOUNTER — Other Ambulatory Visit: Payer: Self-pay | Admitting: *Deleted

## 2023-01-17 DIAGNOSIS — D509 Iron deficiency anemia, unspecified: Secondary | ICD-10-CM

## 2023-01-17 DIAGNOSIS — R748 Abnormal levels of other serum enzymes: Secondary | ICD-10-CM

## 2023-01-17 LAB — CBC
Hematocrit: 23.1 % — ABNORMAL LOW (ref 37.5–51.0)
Hemoglobin: 7.9 g/dL — ABNORMAL LOW (ref 13.0–17.7)
MCH: 34.8 pg — ABNORMAL HIGH (ref 26.6–33.0)
MCHC: 34.2 g/dL (ref 31.5–35.7)
MCV: 102 fL — ABNORMAL HIGH (ref 79–97)
NRBC: 1 % — ABNORMAL HIGH (ref 0–0)
Platelets: 248 10*3/uL (ref 150–450)
RBC: 2.27 x10E6/uL — CL (ref 4.14–5.80)
RDW: 19.9 % — ABNORMAL HIGH (ref 11.6–15.4)
WBC: 6.4 10*3/uL (ref 3.4–10.8)

## 2023-01-17 NOTE — Patient Outreach (Addendum)
  Care Coordination   Follow Up Visit Note   01/17/2023 Name: Leonard Cisneros MRN: [id number] DOB: 01-Jun-1941  Leonard Cisneros is a 82 y.o. year old male who sees [name], FNP for primary care. I spoke with  Leonard Cisneros by phone today.  What matters to the patients health and wellness today?  Swelling of extremities "doing better" restarted torsemide 60 mg daily, compression hose, decrease salt in take,  elevated legs,   Hospital concern starting on 12/28/22 Friday prior to the 12/29/22 discharge.Patient reports a delay in discharge after a paracentesis for pleural effusion, poor communication from staff related to discharge day & time plus the wait time for it to be confirmed he was able to discharge. 12/28/22 1328 note indicated concerns with low oxygen saturations to 85% with mobility.  12/29/22 4098 note indicates the patient had low blood pressures with need of an IV bolus of fluids. The hypotension and low saturation did not resolve until 12/29/22.  Patient states the communication of the delay could have been discussed with him and his wife for understanding    He states he will outreach to the [facility] grievance line [phone] and will refer them to read RN CM 01/17/23 note.   Goals Addressed             This Visit's Progress    THn care coordination services   On track    Interventions Today    Flowsheet Row Most Recent Value  Chronic Disease   Chronic disease during today's visit Congestive Heart Failure (CHF), Other  [swelling of legs & hospital services prior to recent discharge (wait time)]  General Interventions   General Interventions Discussed/Reviewed General Interventions Reviewed, [facility], Doctor Visits  Doctor Visits Discussed/Reviewed Doctor Visits Reviewed, PCP  PCP/Specialist Visits Compliance with follow-up visit  Exercise Interventions   Exercise Discussed/Reviewed Exercise Reviewed, Physical Activity  [confirmed he is keeping active and  likes to be outside completing tasks]  Education Interventions   Education Provided Provided Education  [use of diuretic, [facility] complaint line number [phone]]  Provided Verbal Education On Foot Care, Medication, Community Resources  Mental Health Interventions   Mental Health Discussed/Reviewed Mental Health Reviewed  Nutrition Interventions   Nutrition Discussed/Reviewed Nutrition Discussed, Decreasing salt  Pharmacy Interventions   Pharmacy Dicussed/Reviewed Pharmacy Topics Reviewed, Medications and their functions  Safety Interventions   Safety Discussed/Reviewed Safety Reviewed              SDOH assessments and interventions completed:  No     Care Coordination Interventions:  Yes, provided   Follow up plan: Follow up call scheduled for 02/18/23    Encounter Outcome:  Pt. Visit Completed    Nayelis Bonito L. Noelle Penner, RN, BSN, CCM [facility] Care Management Community Coordinator Office number [phone]

## 2023-01-17 NOTE — Patient Instructions (Addendum)
Visit Information  Thank you for taking time to visit with me today. Please don't hesitate to contact me if I can be of assistance to you.   Following are the goals we discussed today:   Goals Addressed             This Visit's Progress    THn care coordination services   On track    Interventions Today    Flowsheet Row Most Recent Value  Chronic Disease   Chronic disease during today's visit Congestive Heart Failure (CHF), Other  [swelling of legs & hospital services prior to recent discharge (wait time)]  General Interventions   General Interventions Discussed/Reviewed General Interventions Reviewed, [facility], Doctor Visits  Doctor Visits Discussed/Reviewed Doctor Visits Reviewed, PCP  PCP/Specialist Visits Compliance with follow-up visit  Exercise Interventions   Exercise Discussed/Reviewed Exercise Reviewed, Physical Activity  [confirmed he is keeping active and likes to be outside completing tasks]  Education Interventions   Education Provided Provided Education  [use of diuretic, [facility] complaint line number [phone]]  Provided Verbal Education On Foot Care, Medication, Community Resources  Mental Health Interventions   Mental Health Discussed/Reviewed Mental Health Reviewed  Nutrition Interventions   Nutrition Discussed/Reviewed Nutrition Discussed, Decreasing salt  Pharmacy Interventions   Pharmacy Dicussed/Reviewed Pharmacy Topics Reviewed, Medications and their functions  Safety Interventions   Safety Discussed/Reviewed Safety Reviewed              Our next appointment is by telephone on 02/18/23 at 1 pm  Please call the care guide team at [phone] if you need to cancel or reschedule your appointment.   If you are experiencing a Mental Health or Behavioral Health Crisis or need someone to talk to, please call the Suicide and Crisis Lifeline: 988 call the [place] National Suicide Prevention Lifeline: (941)135-7588 or TTY: 747-871-1502 TTY  (630)575-5121) to talk to a trained counselor call 1-800-273-TALK (toll free, 24 hour hotline) call the [facility]: [phone] call 911   Patient verbalizes understanding of instructions and care plan provided today and agrees to view in MyChart. Active MyChart status and patient understanding of how to access instructions and care plan via MyChart confirmed with patient.     The patient has been provided with contact information for the care management team and has been advised to call with any health related questions or concerns.   Chelsea Pedretti L. Noelle Penner, RN, BSN, CCM [facility] Care Management Community Coordinator Office number [phone]

## 2023-01-23 ENCOUNTER — Encounter (HOSPITAL_COMMUNITY): Payer: Self-pay | Admitting: Physical Therapy

## 2023-01-23 ENCOUNTER — Other Ambulatory Visit: Payer: Self-pay

## 2023-01-23 ENCOUNTER — Ambulatory Visit (HOSPITAL_COMMUNITY): Payer: Medicare HMO | Attending: Family Medicine | Admitting: Physical Therapy

## 2023-01-23 DIAGNOSIS — M6281 Muscle weakness (generalized): Secondary | ICD-10-CM | POA: Diagnosis not present

## 2023-01-23 DIAGNOSIS — R2689 Other abnormalities of gait and mobility: Secondary | ICD-10-CM | POA: Diagnosis not present

## 2023-01-23 DIAGNOSIS — R29898 Other symptoms and signs involving the musculoskeletal system: Secondary | ICD-10-CM | POA: Insufficient documentation

## 2023-01-23 DIAGNOSIS — D649 Anemia, unspecified: Secondary | ICD-10-CM | POA: Insufficient documentation

## 2023-01-23 NOTE — Therapy (Signed)
OUTPATIENT PHYSICAL THERAPY LOWER EXTREMITY EVALUATION   Patient Name: Leonard Cisneros MRN: [id number] DOB:May 23, 1941, 81 y.o., male Today's Date: 01/23/2023  END OF SESSION:  PT End of Session - 01/23/23 1342     Visit Number 1    Number of Visits 12    Date for PT Re-Evaluation 03/06/23    Authorization Type Primary: [employer] Secondary: generic commercial    Progress Note Due on Visit 10    PT Start Time 1343    PT Stop Time 1419    PT Time Calculation (min) 36 min    Activity Tolerance Patient tolerated treatment well    Behavior During Therapy WFL for tasks assessed/performed             Past Medical History:  Diagnosis Date   Aortic stenosis    Atrial flutter (HCC)    a. s/p remote ablation by Dr. Ladona Ridgel.   CAD (coronary artery disease)    a. s/p CABG 2003. b. 01/2016: unstable angina - s/p BMS to SVG-ramus intermediate, patent LIMA-mLAD, patent RIMA-dRCA.    Chronic lower back pain    Essential hypertension    Hyperlipidemia    Ischemic cardiomyopathy    a. Cath 01/2016 -  EF 50-55% with mild distal inferior hypocontractility.   Peripheral neuropathy 04/04/2014   Permanent atrial fibrillation [facility])    Past Surgical History:  Procedure Laterality Date   BACK SURGERY     BIOPSY  11/21/2022   Procedure: BIOPSY;  Surgeon: Napoleon Form, MD;  Location: [facility];  Service: Gastroenterology;;   CARDIAC CATHETERIZATION  ~ 2000; 2003   CARDIAC CATHETERIZATION N/A 02/01/2016   Procedure: Left Heart Cath and Cors/Grafts Angiography;  Surgeon: Lennette Bihari, MD;  Location: [facility];  Service: Cardiovascular;  Laterality: N/A;   CARDIAC CATHETERIZATION N/A 02/01/2016   Procedure: Coronary Stent Intervention;  Surgeon: Lennette Bihari, MD;  Location: [facility];  Service: Cardiovascular;  Laterality: N/A;   CATARACT EXTRACTION W/PHACO Left 02/09/2019   Procedure: CATARACT EXTRACTION PHACO AND INTRAOCULAR LENS PLACEMENT (IOC);  Surgeon:  Fabio Pierce, MD;  Location: [facility];  Service: Ophthalmology;  Laterality: Left;  CDE: 27.70   CATARACT EXTRACTION W/PHACO Right 02/23/2019   Procedure: CATARACT EXTRACTION PHACO AND INTRAOCULAR LENS PLACEMENT RIGHT EYE;  Surgeon: Fabio Pierce, MD;  Location: [facility];  Service: Ophthalmology;  Laterality: Right;  CDE: 10.15   COLONOSCOPY  06/04/2002   Normal colon and rectum   COLONOSCOPY  07/08/2012   Procedure: COLONOSCOPY;  Surgeon: Corbin Ade, MD;  Location: [facility];  Service: Endoscopy;  Laterality: N/A;  11:30   COLONOSCOPY N/A 10/02/2017   Procedure: COLONOSCOPY;  Surgeon: Corbin Ade, MD;  Location: [facility];  Service: Endoscopy;  Laterality: N/A;  10:30am   CORONARY ANGIOPLASTY WITH STENT PLACEMENT  02/01/2016   CORONARY ARTERY BYPASS GRAFT  2003   LIMA to LAD,LIMA to distal RCA,SVG to ramus intermediate vessel.   ESOPHAGOGASTRODUODENOSCOPY (EGD) WITH PROPOFOL N/A 11/21/2022   Procedure: ESOPHAGOGASTRODUODENOSCOPY (EGD) WITH PROPOFOL;  Surgeon: Napoleon Form, MD;  Location: [facility];  Service: Gastroenterology;  Laterality: N/A;   ESOPHAGOGASTRODUODENOSCOPY (EGD) WITH PROPOFOL N/A 12/27/2022   Procedure: ESOPHAGOGASTRODUODENOSCOPY (EGD) WITH PROPOFOL;  Surgeon: Lanelle Bal, DO;  Location: [facility];  Service: Endoscopy;  Laterality: N/A;   FEMORAL REVISION Right 03/30/2019   Procedure: Femoral revision right total hip arthroplasty;  Surgeon: Ollen Gross, MD;  Location: [facility];  Service: Orthopedics;  Laterality: Right;    FOREIGN BODY REMOVAL Right 04/02/2014   Procedure: FOREIGN BODY REMOVAL RIGHT FOOT;  Surgeon: Dalia Heading, MD;  Location: [facility];  Service: General;  Laterality: Right;   JOINT REPLACEMENT     JOINT REPLACEMENT     LUMBAR DISC SURGERY     REVISION TOTAL HIP ARTHROPLASTY Bilateral 2005-2012   right-left   RIGHT/LEFT HEART CATH AND CORONARY/GRAFT ANGIOGRAPHY N/A 09/27/2022   Procedure: RIGHT/LEFT HEART CATH AND  CORONARY/GRAFT ANGIOGRAPHY;  Surgeon: [place], Peter M, MD;  Location: [facility];  Service: Cardiovascular;  Laterality: N/A;   SHOULDER OPEN ROTATOR CUFF REPAIR Right    TOTAL HIP ARTHROPLASTY Right 1999   TOTAL HIP ARTHROPLASTY Left 2008   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   Patient Active Problem List   Diagnosis Date Noted   Stasis dermatitis of both legs 01/11/2023   Permanent atrial fibrillation (HCC) 12/27/2022   Contraindication to anticoagulation therapy 12/27/2022   Hemorrhagic shock (HCC) 12/26/2022   ABLA (acute blood loss anemia) 12/26/2022   Gastrointestinal hemorrhage 12/26/2022   Lactic acidosis 12/26/2022   Coronary artery disease involving native coronary artery of native heart without angina pectoris 12/26/2022   Hypovolemic shock (HCC) 12/26/2022   Duodenal ulcer 11/21/2022   Gastritis and gastroduodenitis 11/21/2022   Heme positive stool 11/20/2022   Melena 11/20/2022   Anemia due to chronic blood loss 11/20/2022   History of CAD (coronary artery disease) 11/19/2022   Elevated troponin 11/19/2022   SOB (shortness of breath) 11/18/2022   Acute respiratory failure with hypoxia (HCC) 09/27/2022   Nonrheumatic aortic valve stenosis 09/27/2022   Unstable angina (HCC) 09/27/2022   Acute heart failure (HCC) 09/24/2022   Hypokalemia 09/24/2022   Hyponatremia 09/24/2022   Abnormal liver enzymes 09/24/2022   Hyperbilirubinemia 09/24/2022   Volume overload 09/24/2022   Acquired thrombophilia (HCC) 09/24/2022   Severe anemia 09/24/2022   Cellulitis 11/12/2021   ETOH abuse 11/12/2021   Ambulatory dysfunction 11/12/2021   Severe pulmonary hypertension (HCC) 11/12/2021   Moderate aortic stenosis 11/12/2021   Systolic congestive heart failure (HCC) 11/12/2021   Lumbar  post-laminectomy syndrome 06/15/2021   Chronic pain syndrome 06/15/2021   Lumbar pain 08/31/2020   Pain of left hip joint 08/03/2020   History of revision of total replacement of right hip joint 06/10/2019   Persistent atrial fibrillation (HCC) 04/09/2019   Atrial fibrillation with RVR (HCC) 04/08/2019   Acute on chronic diastolic congestive heart failure (HCC)    Macrocytic anemia    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   Chest pain 10/22/2018   Physical deconditioning 04/26/2018   Unsteady gait 04/26/2018   Closed fracture of femur, greater trochanter (HCC) 04/25/2018   Pain in lower limb 04/24/2018   Fall 04/21/2018   Peripheral vascular disease (HCC) 04/21/2018   History of adenomatous polyp of colon 08/20/2017   Ischemic myocardial dysfunction 02/02/2016   Recurrent coronary arteriosclerosis after percutaneous transluminal coronary angioplasty 02/01/2016   Exercise-induced angina 01/28/2016   Hyperlipidemia 01/28/2016   Neuropathic pain 06/13/2015   Mild obesity 04/20/2015   Essential hypertension 04/20/2015   Disorder of peripheral nervous system 04/04/2014   Foreign body (FB) in soft tissue 03/31/2014   Disorder of glucose metabolism (HCC) 03/31/2014   Obesity with body mass index 30 or greater 03/31/2014   History of  coronary artery bypass graft 01/04/2014   Tear of left rotator cuff 10/22/2013   Rotator cuff syndrome of left shoulder 10/22/2013   Long term (current) use of anticoagulants 06/18/2013   Encounter for general adult medical examination without abnormal findings 06/18/2012   High risk medication use 06/18/2012    PCP: [name] FNP  REFERRING PROVIDER: Narda Bonds, MD  REFERRING DIAG: D64.9 (ICD-10-CM) - Severe anemia  THERAPY DIAG:  Muscle weakness (generalized)  Other abnormalities of gait and mobility  Other symptoms and signs involving the musculoskeletal system  Rationale for Evaluation and Treatment:  Rehabilitation  ONSET DATE: 7-8 years  SUBJECTIVE:   SUBJECTIVE STATEMENT: Patient states he does not feel recovered from hospital admission. He uses axillary crutches to ambulate. About 7-8 years ago with drop foot and a fall. He uses 2 canes to walk at home. Patient having mobility problems but tries to stay active at home. He likes to cook and work out in the yard. He is limited with doing anything physical.   PERTINENT HISTORY: afib, CAD, HTN, chronic back pain, neuropathy, HLD, bilateral THA, hx back surgery, recent hospital admission for anemia from 12/25/22-12/29/22  PAIN:  Are you having pain? No  PRECAUTIONS: Fall  WEIGHT BEARING RESTRICTIONS: No  FALLS:  Has patient fallen in last 6 months? No  OCCUPATION: Retired  PLOF: Independent  PATIENT GOALS: Get moving    OBJECTIVE:  Observation: bilateral LE edema and wounds present   COGNITION: Overall cognitive status: Within functional limits for tasks assessed     SENSATION: Light touch: decreased bilateral feet  EDEMA: bilateral LE edema   POSTURE: rounded shoulders, forward head, and increased thoracic kyphosis  PALPATION: Pitting edema bilaterally into thighs  LOWER EXTREMITY ROM: WFL for tasks assessed  Active ROM Right eval Left eval  Hip flexion    Hip extension    Hip abduction    Hip adduction    Hip internal rotation    Hip external rotation    Knee flexion    Knee extension    Ankle dorsiflexion    Ankle plantarflexion    Ankle inversion    Ankle eversion     (Blank rows = not tested)  LOWER EXTREMITY MMT:  MMT Right eval Left eval  Hip flexion 5 4+  Hip extension    Hip abduction    Hip adduction    Hip internal rotation    Hip external rotation    Knee flexion 5 5  Knee extension 5 5  Ankle dorsiflexion 5 5  Ankle plantarflexion    Ankle inversion    Ankle eversion     (Blank rows = not tested)   FUNCTIONAL TESTS:  5 times sit to stand: 20.78 seconds with bilateral  UE support 2 minute walk test: 120 feet Transfer STS: Labored with bilateral UE support  GAIT: Distance walked: 120 feet Assistive device utilized: Crutches Level of assistance: Modified independence Comments: 2 MWT; trunk flexed, ambulates with bilaterally axillary crutches   TODAY'S TREATMENT:  DATE:  01/23/23 EVAL and HEP    PATIENT EDUCATION:  Education details: Patient educated on exam findings, POC, scope of PT, HEP, and LE edema and compression garments. Person educated: Patient Education method: Explanation, Demonstration, and Handouts Education comprehension: verbalized understanding, returned demonstration, verbal cues required, and tactile cues required  HOME EXERCISE PROGRAM: Access Code: [email or web address] URL: [email or web address]  Date: 01/23/2023 - Seated Long Arc Quad  - 3-5 x daily - 7 x weekly - 10 reps - 5 second hold - Seated March  - 3-5 x daily - 7 x weekly - 10 reps - 5 second hold  ASSESSMENT:  CLINICAL IMPRESSION: Patient a 82 y.o. y.o. male who was seen today for physical therapy evaluation and treatment for weakness/deconditioning. Patient presents with pain limited deficits in bilateral LE strength, ROM, endurance, activity tolerance, gait, balance, and functional mobility with ADL. Patient is having to modify and restrict ADL as indicated by subjective information and objective measures which is affecting overall participation. Patient will benefit from skilled physical therapy in order to improve function and reduce impairment.  OBJECTIVE IMPAIRMENTS: Abnormal gait, decreased activity tolerance, decreased balance, decreased endurance, decreased mobility, difficulty walking, decreased ROM, decreased strength, impaired flexibility, improper body mechanics, and pain.   ACTIVITY LIMITATIONS: carrying, lifting, bending,  standing, squatting, stairs, transfers, locomotion level, and caring for others  PARTICIPATION LIMITATIONS: meal prep, cleaning, laundry, shopping, community activity, and yard work  PERSONAL FACTORS: Fitness, Time since onset of injury/illness/exacerbation, and 3+ comorbidities: afib, CAD, HTN, chronic back pain, neuropathy, HLD  are also affecting patient's functional outcome.   REHAB POTENTIAL: Good  CLINICAL DECISION MAKING: Evolving/moderate complexity  EVALUATION COMPLEXITY: Moderate   GOALS: Goals reviewed with patient? Yes  SHORT TERM GOALS: Target date: 02/13/2023    Patient will be independent with HEP in order to improve functional outcomes. Baseline: Goal status: INITIAL  2.  Patient will report at least 25% improvement in symptoms for improved quality of life. Baseline: Goal status: INITIAL    LONG TERM GOALS: Target date: 03/06/2023    Patient will report at least 75% improvement in symptoms for improved quality of life. Baseline:  Goal status: INITIAL  2.  Patient will be able to complete 5x STS in under 15 seconds in order to reduce the risk of falls. Baseline:  20.78 seconds with bilateral UE support Goal status: INITIAL  3.  Patient will be able to navigate stairs with reciprocal pattern without compensation in order to demonstrate improved LE strength. Baseline:  Goal status: INITIAL  4.  Patient will be able to ambulate at least 226 feet in in order to demonstrate improved tolerance to activity. Baseline: 120 feet Goal status: INITIAL  5.  Patient will report improved mobility to allow for increased completion of chores and yard work.  Baseline:  Goal status: INITIAL    PLAN:  PT FREQUENCY: 2x/week  PT DURATION: 6 weeks  PLANNED INTERVENTIONS: Therapeutic exercises, Therapeutic activity, Neuromuscular re-education, Balance training, Gait training, Patient/Family education, Joint manipulation, Joint mobilization, Stair training,  Orthotic/Fit training, DME instructions, Aquatic Therapy, Dry Needling, Electrical stimulation, Spinal manipulation, Spinal mobilization, Cryotherapy, Moist heat, Compression bandaging, scar mobilization, Splintting, Taping, Traction, Ultrasound, Ionotophoresis 4mg /ml Dexamethasone, and Manual therapy  PLAN FOR NEXT SESSION: gait, balance, functional strength, endurance, postural strength/ core strength   Reola Mosher Gerline Ratto, PT 01/23/2023, 2:22 PM

## 2023-01-29 ENCOUNTER — Encounter: Payer: Self-pay | Admitting: Family Medicine

## 2023-01-29 ENCOUNTER — Ambulatory Visit (INDEPENDENT_AMBULATORY_CARE_PROVIDER_SITE_OTHER): Payer: Medicare HMO | Admitting: Family Medicine

## 2023-01-29 VITALS — BP 101/66 | HR 106 | Ht 71.0 in | Wt 197.1 lb

## 2023-01-29 DIAGNOSIS — I872 Venous insufficiency (chronic) (peripheral): Secondary | ICD-10-CM | POA: Diagnosis not present

## 2023-01-29 MED ORDER — TORSEMIDE 20 MG PO TABS
60.0000 mg | ORAL_TABLET | Freq: Two times a day (BID) | ORAL | 3 refills | Status: DC
Start: 2023-01-29 — End: 2023-01-29

## 2023-01-29 MED ORDER — DOXYCYCLINE HYCLATE 100 MG PO TABS: 100.0000 mg | ORAL_TABLET | Freq: Two times a day (BID) | ORAL | 0 refills | Status: AC

## 2023-01-29 MED ORDER — TORSEMIDE 20 MG PO TABS
60.0000 mg | ORAL_TABLET | Freq: Every day | ORAL | 3 refills | Status: DC
Start: 2023-01-29 — End: 2023-02-16

## 2023-01-29 NOTE — Progress Notes (Signed)
Established Patient Office Visit  Subjective:  Patient ID: Leonard Cisneros, male    DOB: [birth year]  Age: 82 y.o. MRN: [id number]  CC:  Chief Complaint  Patient presents with   Chronic Care Management    2 week f/u for edema in both legs.     HPI Leonard Cisneros is a 82 y.o. male with past medical history of systolic congestive heart failure, essential hypertension, peripheral vascular disease, and persistent atrial fibrillation presents for f/u of stasis dermatitis due to peripheral vascular disease. For the details of today's visit, please refer to the assessment and plan.     Past Medical History:  Diagnosis Date   Aortic stenosis    Atrial flutter (HCC)    a. s/p remote ablation by Dr. Ladona Ridgel.   CAD (coronary artery disease)    a. s/p CABG 2003. b. 01/2016: unstable angina - s/p BMS to SVG-ramus intermediate, patent LIMA-mLAD, patent RIMA-dRCA.    Chronic lower back pain    Essential hypertension    Hyperlipidemia    Ischemic cardiomyopathy    a. Cath 01/2016 -  EF 50-55% with mild distal inferior hypocontractility.   Peripheral neuropathy 04/04/2014   Permanent atrial fibrillation [facility])     Past Surgical History:  Procedure Laterality Date   BACK SURGERY     BIOPSY  11/21/2022   Procedure: BIOPSY;  Surgeon: Napoleon Form, MD;  Location: [facility] ENDOSCOPY;  Service: Gastroenterology;;   CARDIAC CATHETERIZATION  ~ 2000; 2003   CARDIAC CATHETERIZATION N/A 02/01/2016   Procedure: Left Heart Cath and Cors/Grafts Angiography;  Surgeon: Lennette Bihari, MD;  Location: [facility] INVASIVE CV LAB;  Service: Cardiovascular;  Laterality: N/A;   CARDIAC CATHETERIZATION N/A 02/01/2016   Procedure: Coronary Stent Intervention;  Surgeon: Lennette Bihari, MD;  Location: [facility] INVASIVE CV LAB;  Service: Cardiovascular;  Laterality: N/A;   CATARACT EXTRACTION W/PHACO Left 02/09/2019   Procedure: CATARACT EXTRACTION PHACO AND INTRAOCULAR LENS PLACEMENT (IOC);  Surgeon: Fabio Pierce, MD;  Location:  [facility] ORS;  Service: Ophthalmology;  Laterality: Left;  CDE: 27.70   CATARACT EXTRACTION W/PHACO Right 02/23/2019   Procedure: CATARACT EXTRACTION PHACO AND INTRAOCULAR LENS PLACEMENT RIGHT EYE;  Surgeon: Fabio Pierce, MD;  Location: [facility] ORS;  Service: Ophthalmology;  Laterality: Right;  CDE: 10.15   COLONOSCOPY  06/04/2002   Normal colon and rectum   COLONOSCOPY  07/08/2012   Procedure: COLONOSCOPY;  Surgeon: Corbin Ade, MD;  Location: [facility];  Service: Endoscopy;  Laterality: N/A;  11:30   COLONOSCOPY N/A 10/02/2017   Procedure: COLONOSCOPY;  Surgeon: Corbin Ade, MD;  Location: [facility];  Service: Endoscopy;  Laterality: N/A;  10:30am   CORONARY ANGIOPLASTY WITH STENT PLACEMENT  02/01/2016   CORONARY ARTERY BYPASS GRAFT  2003   LIMA to LAD,LIMA to distal RCA,SVG to ramus intermediate vessel.   ESOPHAGOGASTRODUODENOSCOPY (EGD) WITH PROPOFOL N/A 11/21/2022   Procedure: ESOPHAGOGASTRODUODENOSCOPY (EGD) WITH PROPOFOL;  Surgeon: Napoleon Form, MD;  Location: [facility];  Service: Gastroenterology;  Laterality: N/A;   ESOPHAGOGASTRODUODENOSCOPY (EGD) WITH PROPOFOL N/A 12/27/2022   Procedure: ESOPHAGOGASTRODUODENOSCOPY (EGD) WITH PROPOFOL;  Surgeon: Lanelle Bal, DO;  Location: [facility];  Service: Endoscopy;  Laterality: N/A;   FEMORAL REVISION Right 03/30/2019   Procedure: Femoral revision right total hip arthroplasty;  Surgeon: Ollen Gross, MD;  Location: [facility] ORS;  Service: Orthopedics;  Laterality: Right;    FOREIGN BODY REMOVAL Right 04/02/2014   Procedure: FOREIGN BODY REMOVAL RIGHT FOOT;  Surgeon: Dalia Heading, MD;  Location: [facility];  Service: General;  Laterality: Right;   JOINT REPLACEMENT     JOINT REPLACEMENT     LUMBAR DISC SURGERY     REVISION TOTAL HIP ARTHROPLASTY Bilateral 2005-2012   right-left   RIGHT/LEFT HEART CATH AND CORONARY/GRAFT ANGIOGRAPHY N/A 09/27/2022   Procedure: RIGHT/LEFT HEART CATH AND CORONARY/GRAFT ANGIOGRAPHY;  Surgeon:  [place], Peter M, MD;  Location: [facility] INVASIVE CV LAB;  Service: Cardiovascular;  Laterality: N/A;   SHOULDER OPEN ROTATOR CUFF REPAIR Right    TOTAL HIP ARTHROPLASTY Right 1999   TOTAL HIP ARTHROPLASTY Left 2008   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    Family History  Problem Relation Age of Onset   Heart attack Brother    Colon cancer Neg Hx     Social History   Socioeconomic History   Marital status: Married    Spouse name: Larita Fife   Number of children: 2   Years of education: Not on file   Highest education level: Bachelor's degree (e.g., BA, AB, BS)  Occupational History   Occupation: PT Holiday representative: RETIRED  Tobacco Use   Smoking status: Former    Packs/day: 0.50    Years: 2.00    Additional pack years: 0.00    Total pack years: 1.00    Types: Cigarettes    Quit date: 09/03/1966    Years since quitting: 56.4   Smokeless tobacco: Never  Vaping Use   Vaping Use: Never used  Substance and Sexual Activity   Alcohol use: Yes    Alcohol/week: 11.0 standard drinks of alcohol    Types: 7 Glasses of wine, 4 Cans of beer per week    Comment: a glass of wine with dinner/ beer on the weekend   Drug use: No   Sexual activity: Not on file  Other Topics Concern   Not on file  Social History Narrative   Lives with his wife.   Social Determinants of Health   Financial Resource Strain: Low Risk  (01/04/2023)   Overall Financial Resource Strain (CARDIA)    Difficulty of Paying Living Expenses: Not hard at all  Food Insecurity: No Food Insecurity (01/04/2023)   Hunger Vital Sign    Worried About Running Out of Food in the Last Year: Never true    Ran Out of Food in the Last Year: Never true  Transportation Needs: No Transportation Needs (01/04/2023)   PRAPARE -  Administrator, Civil Service (Medical): No    Lack of Transportation (Non-Medical): No  Physical Activity: Unknown (11/25/2022)   Exercise Vital Sign    Days of Exercise per Week: 0 days    Minutes of Exercise per Session: Not on file  Stress: No Stress Concern Present (01/04/2023)   [employer] of Occupational Health - Occupational Stress Questionnaire    Feeling of Stress : Not at all  Social Connections: Moderately Integrated (11/25/2022)   Social Connection and Isolation Panel [NHANES]    Frequency of Communication with Friends and Family: Once a week    Frequency of Social Gatherings with Friends and Family: Once a week    Attends Religious Services: More than 4 times per year    Active Member of [employer] or Organizations: Yes    Attends Banker Meetings:  More than 4 times per year    Marital Status: Married  Catering manager Violence: Not At Risk (12/25/2022)   Humiliation, Afraid, Rape, and Kick questionnaire    Fear of Current or Ex-Partner: No    Emotionally Abused: No    Physically Abused: No    Sexually Abused: No    Outpatient Medications Prior to Visit  Medication Sig Dispense Refill   atorvastatin (LIPITOR) 80 MG tablet TAKE 1 TABLET BY MOUTH EVERY DAY (Patient taking differently: Take 80 mg by mouth daily.) 90 tablet 1   Iron, Ferrous Sulfate, 325 (65 Fe) MG TABS Take 325 mg by mouth daily. 30 tablet 2   KLOR-CON M10 10 MEQ tablet TAKE 3 TABLETS BY MOUTH DAILY. 270 tablet 1   metoprolol succinate (TOPROL-XL) 25 MG 24 hr tablet Take 1 tablet (25 mg total) by mouth daily. 90 tablet 1   nitroGLYCERIN (NITROSTAT) 0.4 MG SL tablet Place 1 tablet (0.4 mg total) under the tongue every 5 (five) minutes x 3 doses as needed for chest pain (if no relief after 3rd dose, proceed to the ED for an evaluation or call 911). 25 tablet 3   pantoprazole (PROTONIX) 40 MG tablet Take 1 tablet (40 mg total) by mouth 2 (two) times daily. 180 tablet 0   sucralfate  (CARAFATE) 1 GM/10ML suspension TAKE 10 MLS (1 G TOTAL) BY MOUTH 4 (FOUR) TIMES DAILY - WITH MEALS AND AT BEDTIME. 420 mL 2   torsemide (DEMADEX) 20 MG tablet Take 3 tablets (60 mg total) by mouth daily. 270 tablet 3   No facility-administered medications prior to visit.    Allergies  Allergen Reactions   Augmentin [Amoxicillin-Pot Clavulanate] Nausea And Vomiting and Other (See Comments)    Has patient had a PCN reaction causing immediate rash, facial/tongue/throat swelling, SOB or lightheadedness with hypotension: Unknown Has patient had a PCN reaction causing severe rash involving mucus membranes or skin necrosis: No Has patient had a PCN reaction that required hospitalization: No Has patient had a PCN reaction occurring within the last 10 years: No If all of the above answers are "NO", then may proceed with Cephalosporin use.     ROS Review of Systems  Constitutional:  Negative for fatigue and fever.  Eyes:  Negative for visual disturbance.  Respiratory:  Negative for chest tightness and shortness of breath.   Cardiovascular:  Negative for chest pain and palpitations.  Skin:  Positive for rash and wound.       Stasis dermatitis   Neurological:  Negative for dizziness and headaches.      Objective:    Physical Exam HENT:     Head: Normocephalic.     Right Ear: External ear normal.     Left Ear: External ear normal.     Nose: No congestion or rhinorrhea.     Mouth/Throat:     Mouth: Mucous membranes are moist.  Cardiovascular:     Rate and Rhythm: Regular rhythm.     Heart sounds: No murmur heard. Pulmonary:     Effort: No respiratory distress.     Breath sounds: Normal breath sounds.  Skin:    Comments: Multiple weeping venous ulcers noted on the lower extremity  Neurological:     Mental Status: He is alert.     BP 101/66   Pulse (!) 106   Ht 5\' 11"  (1.803 m)   Wt 197 lb 1.9 oz (89.4 kg)   SpO2 92%   BMI 27.49 kg/m  Wt Readings  from Last 3 Encounters:   01/29/23 197 lb 1.9 oz (89.4 kg)  01/10/23 195 lb 0.6 oz (88.5 kg)  01/08/23 195 lb (88.5 kg)    Lab Results  Component Value Date   TSH 3.009 11/20/2022   Lab Results  Component Value Date   WBC 6.4 01/16/2023   HGB 7.9 (L) 01/16/2023   HCT 23.1 (L) 01/16/2023   MCV 102 (H) 01/16/2023   PLT 248 01/16/2023   Lab Results  Component Value Date   NA 139 01/10/2023   K 3.5 01/10/2023   CO2 27 01/10/2023   GLUCOSE 106 (H) 01/10/2023   BUN 15 01/10/2023   CREATININE 1.01 01/10/2023   BILITOT 2.3 (H) 01/10/2023   ALKPHOS 243 (H) 12/29/2022   AST 38 (H) 01/10/2023   ALT 34 01/10/2023   PROT 5.4 (L) 01/10/2023   ALBUMIN 2.6 (L) 12/29/2022   CALCIUM 8.3 (L) 01/10/2023   ANIONGAP 7 12/27/2022   EGFR 79 11/29/2022   Lab Results  Component Value Date   CHOL 110 11/12/2021   Lab Results  Component Value Date   HDL 74 11/12/2021   Lab Results  Component Value Date   LDLCALC 31 11/12/2021   Lab Results  Component Value Date   TRIG 23 11/12/2021   Lab Results  Component Value Date   CHOLHDL 1.5 11/12/2021   Lab Results  Component Value Date   HGBA1C 4.6 (L) 11/18/2022      Assessment & Plan:  Stasis dermatitis of both legs Assessment & Plan: Likely due to chronic venous insufficiency History of heart failure and compliant on torsemide 60 mg daily No symptoms of chest pain, palpitation, shortness of breath, pain reported Lungs clear bilaterally Will be following up with cardiology on  02/08/2023 Edema likely due to increased hydrostatic pressure Encouraged use of compression stocking 20-30 mmHg and leg elevation Encouraged to continue regimen on torsemide 60 mg daily Will treat prophylactically due to open wounds on his lower extremities with doxycycline twice daily for 7 days Referral to wound care  Orders: -     AMB referral to wound care center -     Doxycycline Hyclate; Take 1 tablet (100 mg total) by mouth 2 (two) times daily for 7 days.  Dispense: 14  tablet; Refill: 0 -     CBC with Differential/Platelet -     BMP8+EGFR -     Torsemide; Take 3 tablets (60 mg total) by mouth daily.  Dispense: 270 tablet; Refill: 3    Follow-up: Return in about 2 weeks (around 02/12/2023).   [name], FNP

## 2023-01-29 NOTE — Patient Instructions (Addendum)
I appreciate the opportunity to provide care to you today!    Follow up:  2 weeks  Labs: please stop by the lab today to get your blood drawn (BMP and CBC)  I will treat you prophetically with doxycycline twice daily for 7 days  Referrals today- Wound care at [facility]    Please continue to a heart-healthy diet and increase your physical activities. Try to exercise for at least five days a week.      It was a pleasure to see you and I look forward to continuing to work together on your health and well-being. Please do not hesitate to call the office if you need care or have questions about your care.   Have a wonderful day and week. With Gratitude, [name] MSN, FNP-BC

## 2023-01-29 NOTE — Assessment & Plan Note (Addendum)
Likely due to chronic venous insufficiency History of heart failure and compliant on torsemide 60 mg daily No symptoms of chest pain, palpitation, shortness of breath, pain reported Lungs clear bilaterally Will be following up with cardiology on  02/08/2023 Edema likely due to increased hydrostatic pressure Encouraged use of compression stocking 20-30 mmHg and leg elevation Encouraged to continue regimen on torsemide 60 mg daily Will treat prophylactically due to open wounds on his lower extremities with doxycycline twice daily for 7 days Referral to wound care

## 2023-01-30 ENCOUNTER — Ambulatory Visit (HOSPITAL_COMMUNITY): Payer: Medicare HMO

## 2023-01-30 ENCOUNTER — Encounter (HOSPITAL_COMMUNITY): Payer: Self-pay

## 2023-01-30 DIAGNOSIS — R2689 Other abnormalities of gait and mobility: Secondary | ICD-10-CM | POA: Diagnosis not present

## 2023-01-30 DIAGNOSIS — R29898 Other symptoms and signs involving the musculoskeletal system: Secondary | ICD-10-CM | POA: Diagnosis not present

## 2023-01-30 DIAGNOSIS — M6281 Muscle weakness (generalized): Secondary | ICD-10-CM

## 2023-01-30 DIAGNOSIS — D649 Anemia, unspecified: Secondary | ICD-10-CM | POA: Diagnosis not present

## 2023-01-30 NOTE — Therapy (Addendum)
OUTPATIENT PHYSICAL THERAPY LOWER EXTREMITY TREATMENT   Patient Name: Leonard Cisneros MRN: [id number] DOB:15-Feb-1941, 82 y.o., male Today's Date: 01/30/2023  END OF SESSION:  PT End of Session - 01/30/23 1033     Visit Number 2    Number of Visits 12    Date for PT Re-Evaluation 03/06/23    Authorization Type Primary: [employer] Medicare Secondary: generic commercial    Progress Note Due on Visit 10    PT Start Time 1003    PT Stop Time 1025   Pt requested to leave early   PT Time Calculation (min) 22 min    Activity Tolerance Patient tolerated treatment well    Behavior During Therapy WFL for tasks assessed/performed              Past Medical History:  Diagnosis Date   Aortic stenosis    Atrial flutter (HCC)    a. s/p remote ablation by Dr. Ladona Ridgel.   CAD (coronary artery disease)    a. s/p CABG 2003. b. 01/2016: unstable angina - s/p BMS to SVG-ramus intermediate, patent LIMA-mLAD, patent RIMA-dRCA.    Chronic lower back pain    Essential hypertension    Hyperlipidemia    Ischemic cardiomyopathy    a. Cath 01/2016 -  EF 50-55% with mild distal inferior hypocontractility.   Peripheral neuropathy 04/04/2014   Permanent atrial fibrillation [facility])    Past Surgical History:  Procedure Laterality Date   BACK SURGERY     BIOPSY  11/21/2022   Procedure: BIOPSY;  Surgeon: Napoleon Form, MD;  [facility];  Service: Gastroenterology;;   CARDIAC CATHETERIZATION  ~ 2000; 2003   CARDIAC CATHETERIZATION N/A 02/01/2016   Procedure: Left Heart Cath and Cors/Grafts Angiography;  Surgeon: Lennette Bihari, MD;  [facility];  Service: Cardiovascular;  Laterality: N/A;   CARDIAC CATHETERIZATION N/A 02/01/2016   Procedure: Coronary Stent Intervention;  Surgeon: Lennette Bihari, MD;  [facility];  Service: Cardiovascular;  Laterality: N/A;   CATARACT EXTRACTION W/PHACO Left 02/09/2019   Procedure: CATARACT EXTRACTION PHACO AND INTRAOCULAR LENS  PLACEMENT (IOC);  Surgeon: Fabio Pierce, MD;  Location: [facility];  Service: Ophthalmology;  Laterality: Left;  CDE: 27.70   CATARACT EXTRACTION W/PHACO Right 02/23/2019   Procedure: CATARACT EXTRACTION PHACO AND INTRAOCULAR LENS PLACEMENT RIGHT EYE;  Surgeon: Fabio Pierce, MD;  Location: [facility];  Service: Ophthalmology;  Laterality: Right;  CDE: 10.15   COLONOSCOPY  06/04/2002   Normal colon and rectum   COLONOSCOPY  07/08/2012   Procedure: COLONOSCOPY;  Surgeon: Corbin Ade, MD;  [facility];  Service: Endoscopy;  Laterality: N/A;  11:30   COLONOSCOPY N/A 10/02/2017   Procedure: COLONOSCOPY;  Surgeon: Corbin Ade, MD;  [facility];  Service: Endoscopy;  Laterality: N/A;  10:30am   CORONARY ANGIOPLASTY WITH STENT PLACEMENT  02/01/2016   CORONARY ARTERY BYPASS GRAFT  2003   LIMA to LAD,LIMA to distal RCA,SVG to ramus intermediate vessel.   ESOPHAGOGASTRODUODENOSCOPY (EGD) WITH PROPOFOL N/A 11/21/2022   Procedure: ESOPHAGOGASTRODUODENOSCOPY (EGD) WITH PROPOFOL;  Surgeon: Napoleon Form, MD;  [facility];  Service: Gastroenterology;  Laterality: N/A;   ESOPHAGOGASTRODUODENOSCOPY (EGD) WITH PROPOFOL N/A 12/27/2022   Procedure: ESOPHAGOGASTRODUODENOSCOPY (EGD) WITH PROPOFOL;  Surgeon: Lanelle Bal, DO;  [facility];  Service: Endoscopy;  Laterality: N/A;   FEMORAL REVISION Right 03/30/2019   Procedure: Femoral revision right total hip arthroplasty;  Surgeon: Ollen Gross, MD;  Location: [facility];  Service: Orthopedics;  Laterality: Right;    FOREIGN BODY REMOVAL Right 04/02/2014   Procedure: FOREIGN BODY REMOVAL RIGHT FOOT;  Surgeon: Dalia Heading, MD;  Location: [facility];  Service: General;  Laterality: Right;   JOINT REPLACEMENT     JOINT REPLACEMENT     LUMBAR DISC SURGERY     REVISION TOTAL HIP ARTHROPLASTY Bilateral 2005-2012   right-left   RIGHT/LEFT HEART CATH AND CORONARY/GRAFT ANGIOGRAPHY N/A 09/27/2022   Procedure:  RIGHT/LEFT HEART CATH AND CORONARY/GRAFT ANGIOGRAPHY;  Surgeon: [place], Peter M, MD;  [facility];  Service: Cardiovascular;  Laterality: N/A;   SHOULDER OPEN ROTATOR CUFF REPAIR Right    TOTAL HIP ARTHROPLASTY Right 1999   TOTAL HIP ARTHROPLASTY Left 2008   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   Patient Active Problem List   Diagnosis Date Noted   Stasis dermatitis of both legs 01/11/2023   Permanent atrial fibrillation (HCC) 12/27/2022   Contraindication to anticoagulation therapy 12/27/2022   Hemorrhagic shock (HCC) 12/26/2022   ABLA (acute blood loss anemia) 12/26/2022   Gastrointestinal hemorrhage 12/26/2022   Lactic acidosis 12/26/2022   Coronary artery disease involving native coronary artery of native heart without angina pectoris 12/26/2022   Hypovolemic shock (HCC) 12/26/2022   Duodenal ulcer 11/21/2022   Gastritis and gastroduodenitis 11/21/2022   Heme positive stool 11/20/2022   Melena 11/20/2022   Anemia due to chronic blood loss 11/20/2022   History of CAD (coronary artery disease) 11/19/2022   Elevated troponin 11/19/2022   SOB (shortness of breath) 11/18/2022   Acute respiratory failure with hypoxia (HCC) 09/27/2022   Nonrheumatic aortic valve stenosis 09/27/2022   Unstable angina (HCC) 09/27/2022   Acute heart failure (HCC) 09/24/2022   Hypokalemia 09/24/2022   Hyponatremia 09/24/2022   Abnormal liver enzymes 09/24/2022   Hyperbilirubinemia 09/24/2022   Volume overload 09/24/2022   Acquired thrombophilia (HCC) 09/24/2022   Severe anemia 09/24/2022   Cellulitis 11/12/2021   ETOH abuse 11/12/2021   Ambulatory dysfunction 11/12/2021   Severe pulmonary hypertension (HCC) 11/12/2021   Moderate aortic stenosis 11/12/2021   Systolic congestive heart failure  (HCC) 11/12/2021   Lumbar post-laminectomy syndrome 06/15/2021   Chronic pain syndrome 06/15/2021   Lumbar pain 08/31/2020   Pain of left hip joint 08/03/2020   History of revision of total replacement of right hip joint 06/10/2019   Persistent atrial fibrillation (HCC) 04/09/2019   Atrial fibrillation with RVR (HCC) 04/08/2019   Acute on chronic diastolic congestive heart failure (HCC)    Macrocytic anemia    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   Chest pain 10/22/2018   Physical deconditioning 04/26/2018   Unsteady gait 04/26/2018   Closed fracture of femur, greater trochanter (HCC) 04/25/2018   Pain in lower limb 04/24/2018   Fall 04/21/2018   Peripheral vascular disease (HCC) 04/21/2018   History of adenomatous polyp of colon 08/20/2017   Ischemic myocardial dysfunction 02/02/2016   Recurrent coronary arteriosclerosis after percutaneous transluminal coronary angioplasty 02/01/2016   Exercise-induced angina 01/28/2016   Hyperlipidemia 01/28/2016   Neuropathic pain 06/13/2015   Mild obesity 04/20/2015   Essential hypertension 04/20/2015   Disorder of peripheral nervous system 04/04/2014   Foreign body (FB) in soft tissue 03/31/2014   Disorder of glucose metabolism (HCC) 03/31/2014   Obesity with body mass index 30  or greater 03/31/2014   History of coronary artery bypass graft 01/04/2014   Tear of left rotator cuff 10/22/2013   Rotator cuff syndrome of left shoulder 10/22/2013   Long term (current) use of anticoagulants 06/18/2013   Encounter for general adult medical examination without abnormal findings 06/18/2012   High risk medication use 06/18/2012    PCP: [name] FNP  REFERRING PROVIDER: Narda Bonds, MD  REFERRING DIAG: D64.9 (ICD-10-CM) - Severe anemia  THERAPY DIAG:  Muscle weakness (generalized)  Other abnormalities of gait and mobility  Other symptoms and signs involving the musculoskeletal system  Rationale for Evaluation and  Treatment: Rehabilitation  ONSET DATE: 7-8 years  SUBJECTIVE:   SUBJECTIVE STATEMENT: 01/30/23:  Pt stated he's having a rough day today walking, stated he has been doing yardwork and planting flowers.  Has began the exercises without questions.  No reports of recent falls.  Pt arrived wearing compression garments ambulating with crutches.  Patient states he does not feel recovered from hospital admission. He uses axillary crutches to ambulate. About 7-8 years ago with drop foot and a fall. He uses 2 canes to walk at home. Patient having mobility problems but tries to stay active at home. He likes to cook and work out in the yard. He is limited with doing anything physical.   PERTINENT HISTORY: afib, CAD, HTN, chronic back pain, neuropathy, HLD, bilateral THA, hx back surgery, recent hospital admission for anemia from 12/25/22-12/29/22  PAIN:  Are you having pain? No  PRECAUTIONS: Fall  WEIGHT BEARING RESTRICTIONS: No  FALLS:  Has patient fallen in last 6 months? No  OCCUPATION: Retired  PLOF: Independent  PATIENT GOALS: Get moving    OBJECTIVE:  Observation: bilateral LE edema and wounds present   COGNITION: Overall cognitive status: Within functional limits for tasks assessed     SENSATION: Light touch: decreased bilateral feet  EDEMA: bilateral LE edema   POSTURE: rounded shoulders, forward head, and increased thoracic kyphosis  PALPATION: Pitting edema bilaterally into thighs  LOWER EXTREMITY ROM: WFL for tasks assessed  Active ROM Right eval Left eval  Hip flexion    Hip extension    Hip abduction    Hip adduction    Hip internal rotation    Hip external rotation    Knee flexion    Knee extension    Ankle dorsiflexion    Ankle plantarflexion    Ankle inversion    Ankle eversion     (Blank rows = not tested)  LOWER EXTREMITY MMT:  MMT Right eval Left eval  Hip flexion 5 4+  Hip extension    Hip abduction    Hip adduction    Hip internal  rotation    Hip external rotation    Knee flexion 5 5  Knee extension 5 5  Ankle dorsiflexion 5 5  Ankle plantarflexion    Ankle inversion    Ankle eversion     (Blank rows = not tested)   FUNCTIONAL TESTS:  5 times sit to stand: 20.78 seconds with bilateral UE support 2 minute walk test: 120 feet Transfer STS: Labored with bilateral UE support  GAIT: Distance walked: 120 feet Assistive device utilized: Crutches Level of assistance: Modified independence Comments: 2 MWT; trunk flexed, ambulates with bilaterally axillary crutches   TODAY'S TREATMENT:  DATE:  01/30/23: Reviewed goals Educated importance of HEP compliance for maximal benefits Seated: LAQ, march, add, abduction  10 STS with elevated height and cueing for nose over toes Attempted supine exercises pt stated he was unable to complete today- attempt in future Discussed sleeping position- stated he sleeps in bed but then has to go to recliner after 4-5 hours Standing heel raises 10x Attempted seated abdominal sets- pt stated he had to leave for another apt  01/23/23 EVAL and HEP    PATIENT EDUCATION:  Education details: Patient educated on exam findings, POC, scope of PT, HEP, and LE edema and compression garments. 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  Pt able to recall current exercise program though required cueing to slow down and control movements.  Pt presents with forward flexed posture at the trunk and ambulates with 2 crutches.  Attempted exercises for abdominal strength and proximal strengthening.  Required elevated height and cueing for mechanics to complete STS without HHA.  Pt stated unable to lay in supine position through states he does sleep in bed, will attempt bridges next session if willing to lay down.  Attempted abdominal sets in seated position, pt stated he had another apt in 5 minutes and had to leave early.  Pt educated length of time for PT sessions and importance of completeing exercises regularly, needs to schedule more apts though had to leave so plans to schedule more tomorrow.    Eval:  Patient a 82 y.o. y.o. male who was seen today for physical therapy evaluation and treatment for weakness/deconditioning. Patient presents with pain limited deficits in bilateral LE strength, ROM, endurance, activity tolerance, gait, balance, and functional mobility with ADL. Patient is having to modify and restrict ADL as indicated by subjective information and objective measures which is affecting overall participation. Patient will benefit from skilled physical therapy in order to improve function and reduce impairment.  OBJECTIVE IMPAIRMENTS: Abnormal gait, decreased activity tolerance, decreased balance, decreased endurance, decreased mobility, difficulty walking, decreased ROM, decreased strength, impaired flexibility, improper body mechanics, and pain.   ACTIVITY LIMITATIONS: carrying, lifting, bending, standing, squatting, stairs, transfers, locomotion level, and caring for others  PARTICIPATION LIMITATIONS: meal prep, cleaning, laundry, shopping, community activity, and yard work  PERSONAL FACTORS: Fitness, Time since onset of injury/illness/exacerbation, and  3+ comorbidities: afib, CAD, HTN, chronic back pain, neuropathy, HLD  are also affecting patient's functional outcome.   REHAB POTENTIAL: Good  CLINICAL DECISION MAKING: Evolving/moderate complexity  EVALUATION COMPLEXITY: Moderate   GOALS: Goals reviewed with patient? Yes  SHORT TERM GOALS: Target date: 02/13/2023    Patient will be independent with HEP in order to improve functional outcomes. Baseline: Goal status: IN PROGRESS  2.  Patient will report at least 25% improvement in symptoms for improved quality of life. Baseline: Goal status: IN PROGRESS    LONG TERM GOALS: Target date: 03/06/2023    Patient will report at least 75% improvement  in symptoms for improved quality of life. Baseline:  Goal status: IN PROGRESS  2.  Patient will be able to complete 5x STS in under 15 seconds in order to reduce the risk of falls. Baseline:  20.78 seconds with bilateral UE support Goal status: IN PROGRESS  3.  Patient will be able to navigate stairs with reciprocal pattern without compensation in order to demonstrate improved LE strength. Baseline:  Goal status: IN PROGRESS  4.  Patient will be able to ambulate at least 226 feet in in order to demonstrate improved tolerance to activity. Baseline: 120 feet Goal status: IN PROGRESS  5.  Patient will report improved mobility to allow for increased completion of chores and yard work.  Baseline:  Goal status: IN PROGRESS    PLAN:  PT FREQUENCY: 2x/week  PT DURATION: 6 weeks  PLANNED INTERVENTIONS: Therapeutic exercises, Therapeutic activity, Neuromuscular re-education, Balance training, Gait training, Patient/Family education, Joint manipulation, Joint mobilization, Stair training, Orthotic/Fit training, DME instructions, Aquatic Therapy, Dry Needling, Electrical stimulation, Spinal manipulation, Spinal mobilization, Cryotherapy, Moist heat, Compression bandaging, scar mobilization, Splintting, Taping, Traction, Ultrasound,  Ionotophoresis 4mg /ml Dexamethasone, and Manual therapy  PLAN FOR NEXT SESSION: gait, balance, functional strength, endurance, postural strength/ core strength  Becky Sax, LPTA/CLT; CBIS [phone]  Juel Burrow, PTA 01/30/2023, 1:20 PM

## 2023-01-31 ENCOUNTER — Other Ambulatory Visit: Payer: Self-pay

## 2023-01-31 ENCOUNTER — Encounter: Payer: Self-pay | Admitting: Family Medicine

## 2023-01-31 DIAGNOSIS — I872 Venous insufficiency (chronic) (peripheral): Secondary | ICD-10-CM

## 2023-01-31 MED ORDER — UNABLE TO FIND
0 refills | Status: DC
Start: 2023-01-31 — End: 2023-02-14

## 2023-02-04 ENCOUNTER — Ambulatory Visit (HOSPITAL_COMMUNITY): Payer: Medicare HMO | Attending: Family Medicine | Admitting: Physical Therapy

## 2023-02-04 ENCOUNTER — Encounter (HOSPITAL_COMMUNITY): Payer: Self-pay

## 2023-02-04 DIAGNOSIS — R2689 Other abnormalities of gait and mobility: Secondary | ICD-10-CM | POA: Insufficient documentation

## 2023-02-04 DIAGNOSIS — S31809A Unspecified open wound of unspecified buttock, initial encounter: Secondary | ICD-10-CM | POA: Insufficient documentation

## 2023-02-04 DIAGNOSIS — S81801A Unspecified open wound, right lower leg, initial encounter: Secondary | ICD-10-CM | POA: Insufficient documentation

## 2023-02-04 DIAGNOSIS — M6281 Muscle weakness (generalized): Secondary | ICD-10-CM | POA: Insufficient documentation

## 2023-02-04 DIAGNOSIS — R29898 Other symptoms and signs involving the musculoskeletal system: Secondary | ICD-10-CM | POA: Insufficient documentation

## 2023-02-04 NOTE — Therapy (Signed)
OUTPATIENT PHYSICAL THERAPY LOWER EXTREMITY TREATMENT   Patient Name: Leonard Cisneros MRN: [id number] DOB:01-29-1941, 82 y.o., male Today's Date: 02/04/2023  END OF SESSION:  PT End of Session - 02/04/23 1045    Visit Number 2    Number of Visits 12    Date for PT Re-Evaluation 03/06/23    Authorization Type Primary: [employer] Medicare Secondary: generic commercial    Progress Note Due on Visit 10    PT Start Time 1035    PT Stop Time 1045    PT Time Calculation (min) 10 min    Activity Tolerance Patient tolerated treatment well    Behavior During Therapy WFL for tasks assessed/performed              Past Medical History:  Diagnosis Date   Aortic stenosis    Atrial flutter (HCC)    a. s/p remote ablation by Dr. Ladona Ridgel.   CAD (coronary artery disease)    a. s/p CABG 2003. b. 01/2016: unstable angina - s/p BMS to SVG-ramus intermediate, patent LIMA-mLAD, patent RIMA-dRCA.    Chronic lower back pain    Essential hypertension    Hyperlipidemia    Ischemic cardiomyopathy    a. Cath 01/2016 -  EF 50-55% with mild distal inferior hypocontractility.   Peripheral neuropathy 04/04/2014   Permanent atrial fibrillation [facility])    Past Surgical History:  Procedure Laterality Date   BACK SURGERY     BIOPSY  11/21/2022   Procedure: BIOPSY;  Surgeon: Napoleon Form, MD;  [facility];  Service: Gastroenterology;;   CARDIAC CATHETERIZATION  ~ 2000; 2003   CARDIAC CATHETERIZATION N/A 02/01/2016   Procedure: Left Heart Cath and Cors/Grafts Angiography;  Surgeon: Lennette Bihari, MD;  [facility];  Service: Cardiovascular;  Laterality: N/A;   CARDIAC CATHETERIZATION N/A 02/01/2016   Procedure: Coronary Stent Intervention;  Surgeon: Lennette Bihari, MD;  [facility];  Service: Cardiovascular;  Laterality: N/A;   CATARACT EXTRACTION W/PHACO Left 02/09/2019   Procedure: CATARACT EXTRACTION PHACO AND INTRAOCULAR LENS PLACEMENT (IOC);  Surgeon: Fabio Pierce, MD;  [facility];  Service: Ophthalmology;  Laterality: Left;  CDE: 27.70   CATARACT EXTRACTION W/PHACO Right 02/23/2019   Procedure: CATARACT EXTRACTION PHACO AND INTRAOCULAR LENS PLACEMENT RIGHT EYE;  Surgeon: Fabio Pierce, MD;  [facility]: [facility];  Service: Ophthalmology;  Laterality: Right;  CDE: 10.15   COLONOSCOPY  06/04/2002   Normal colon and rectum   COLONOSCOPY  07/08/2012   Procedure: COLONOSCOPY;  Surgeon: Corbin Ade, MD;  [facility];  Service: Endoscopy;  Laterality: N/A;  11:30   COLONOSCOPY N/A 10/02/2017   Procedure: COLONOSCOPY;  Surgeon: Corbin Ade, MD;  [facility];  Service: Endoscopy;  Laterality: N/A;  10:30am   CORONARY ANGIOPLASTY WITH STENT PLACEMENT  02/01/2016   CORONARY ARTERY BYPASS GRAFT  2003   LIMA to LAD,LIMA to distal RCA,SVG to ramus intermediate vessel.   ESOPHAGOGASTRODUODENOSCOPY (EGD) WITH PROPOFOL N/A 11/21/2022   Procedure: ESOPHAGOGASTRODUODENOSCOPY (EGD) WITH PROPOFOL;  Surgeon: Napoleon Form, MD;  [facility];  Service: Gastroenterology;  Laterality: N/A;   ESOPHAGOGASTRODUODENOSCOPY (EGD) WITH PROPOFOL N/A 12/27/2022   Procedure: ESOPHAGOGASTRODUODENOSCOPY (EGD) WITH PROPOFOL;  Surgeon: Lanelle Bal, DO;  [facility];  Service: Endoscopy;  Laterality: N/A;   FEMORAL REVISION Right 03/30/2019   Procedure: Femoral revision right total hip arthroplasty;  Surgeon: Ollen Gross, MD;  Location: [facility];  Service: Orthopedics;  Laterality: Right;    FOREIGN BODY REMOVAL Right 04/02/2014   Procedure: FOREIGN BODY REMOVAL RIGHT FOOT;  Surgeon: Dalia Heading, MD;  Location: [facility];  Service: General;  Laterality: Right;   JOINT REPLACEMENT     JOINT REPLACEMENT     LUMBAR DISC SURGERY     REVISION TOTAL HIP ARTHROPLASTY Bilateral 2005-2012   right-left   RIGHT/LEFT HEART CATH AND CORONARY/GRAFT ANGIOGRAPHY N/A 09/27/2022   Procedure: RIGHT/LEFT HEART CATH AND CORONARY/GRAFT  ANGIOGRAPHY;  Surgeon: [place], Peter M, MD;  [facility];  Service: Cardiovascular;  Laterality: N/A;   SHOULDER OPEN ROTATOR CUFF REPAIR Right    TOTAL HIP ARTHROPLASTY Right 1999   TOTAL HIP ARTHROPLASTY Left 2008   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   Patient Active Problem List   Diagnosis Date Noted   Stasis dermatitis of both legs 01/11/2023   Permanent atrial fibrillation (HCC) 12/27/2022   Contraindication to anticoagulation therapy 12/27/2022   Hemorrhagic shock (HCC) 12/26/2022   ABLA (acute blood loss anemia) 12/26/2022   Gastrointestinal hemorrhage 12/26/2022   Lactic acidosis 12/26/2022   Coronary artery disease involving native coronary artery of native heart without angina pectoris 12/26/2022   Hypovolemic shock (HCC) 12/26/2022   Duodenal ulcer 11/21/2022   Gastritis and gastroduodenitis 11/21/2022   Heme positive stool 11/20/2022   Melena 11/20/2022   Anemia due to chronic blood loss 11/20/2022   History of CAD (coronary artery disease) 11/19/2022   Elevated troponin 11/19/2022   SOB (shortness of breath) 11/18/2022   Acute respiratory failure with hypoxia (HCC) 09/27/2022   Nonrheumatic aortic valve stenosis 09/27/2022   Unstable angina (HCC) 09/27/2022   Acute heart failure (HCC) 09/24/2022   Hypokalemia 09/24/2022   Hyponatremia 09/24/2022   Abnormal liver enzymes 09/24/2022   Hyperbilirubinemia 09/24/2022   Volume overload 09/24/2022   Acquired thrombophilia (HCC) 09/24/2022   Severe anemia 09/24/2022   Cellulitis 11/12/2021   ETOH abuse 11/12/2021   Ambulatory dysfunction 11/12/2021   Severe pulmonary hypertension (HCC) 11/12/2021   Moderate aortic stenosis 11/12/2021   Systolic congestive heart failure (HCC) 11/12/2021   Lumbar post-laminectomy  syndrome 06/15/2021   Chronic pain syndrome 06/15/2021   Lumbar pain 08/31/2020   Pain of left hip joint 08/03/2020   History of revision of total replacement of right hip joint 06/10/2019   Persistent atrial fibrillation (HCC) 04/09/2019   Atrial fibrillation with RVR (HCC) 04/08/2019   Acute on chronic diastolic congestive heart failure (HCC)    Macrocytic anemia    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   Chest pain 10/22/2018   Physical deconditioning 04/26/2018   Unsteady gait 04/26/2018   Closed fracture of femur, greater trochanter (HCC) 04/25/2018   Pain in lower limb 04/24/2018   Fall 04/21/2018   Peripheral vascular disease (HCC) 04/21/2018   History of adenomatous polyp of colon 08/20/2017   Ischemic myocardial dysfunction 02/02/2016   Recurrent coronary arteriosclerosis after percutaneous transluminal coronary angioplasty 02/01/2016   Exercise-induced angina 01/28/2016   Hyperlipidemia 01/28/2016   Neuropathic pain 06/13/2015   Mild obesity 04/20/2015   Essential hypertension 04/20/2015   Disorder of peripheral nervous system 04/04/2014   Foreign body (FB) in soft tissue 03/31/2014   Disorder of glucose metabolism (HCC) 03/31/2014   Obesity with body mass index 30 or greater 03/31/2014   History of  coronary artery bypass graft 01/04/2014   Tear of left rotator cuff 10/22/2013   Rotator cuff syndrome of left shoulder 10/22/2013   Long term (current) use of anticoagulants 06/18/2013   Encounter for general adult medical examination without abnormal findings 06/18/2012   High risk medication use 06/18/2012    PCP: [name] FNP  REFERRING PROVIDER: Narda Bonds, MD  REFERRING DIAG: D64.9 (ICD-10-CM) - Severe anemia  THERAPY DIAG:  Muscle weakness (generalized)  Other abnormalities of gait and mobility  Other symptoms and signs involving the musculoskeletal system  Rationale for Evaluation and Treatment: Rehabilitation  ONSET DATE: 7-8  years   PT signed in at 10:30, Therapist went out to start session at 10:35 at which time pt had left the facility for an unknown reason.  Treatment no completed  Virgina Organ, PT CLT [phone]

## 2023-02-05 ENCOUNTER — Other Ambulatory Visit: Payer: Self-pay | Admitting: *Deleted

## 2023-02-05 ENCOUNTER — Telehealth: Payer: Self-pay | Admitting: *Deleted

## 2023-02-05 DIAGNOSIS — R748 Abnormal levels of other serum enzymes: Secondary | ICD-10-CM

## 2023-02-05 DIAGNOSIS — D509 Iron deficiency anemia, unspecified: Secondary | ICD-10-CM

## 2023-02-05 NOTE — Telephone Encounter (Signed)
Mailed lab requisitions.  

## 2023-02-08 ENCOUNTER — Other Ambulatory Visit: Payer: Self-pay | Admitting: Cardiology

## 2023-02-08 ENCOUNTER — Ambulatory Visit: Payer: Medicare HMO | Admitting: Cardiovascular Disease

## 2023-02-09 ENCOUNTER — Encounter: Payer: Self-pay | Admitting: Cardiology

## 2023-02-10 NOTE — Progress Notes (Unsigned)
Electrophysiology Office Note:    Date:  02/10/2023   ID:  BONHAM MASSARO, DOB 02/21/1941, MRN [id number]  [facility] Cardiologist:  [name], MD  [facility] Electrophysiologist:  Lanier Prude, MD   Referring MD: [name], FNP   Chief Complaint: Atrial fibrillation  History of Present Illness:    Leonard Cisneros is a 82 y.o. male who I am seeing today for an evaluation of atrial fibrillation at the request of Dr. Diona Browner. The patient was last seen by Dr. Diona Browner December 31, 2022.  The patient has a history of GI bleeding and significant anemia requiring transfusion, permanent atrial fibrillation, coronary artery disease post CABG in 2003, aortic stenosis (moderate), hypertension, hyperlipidemia.  The patient was recently hospitalized April 23 to April 27 with melena.  He was on Eliquis at the time.  His hemoglobin was down to 5 and required blood transfusion.  He was briefly in the ICU and required vasopressors.  Endoscopy showed a nonbleeding duodenal ulcer and gastritis.        Their past medical, social and family history was reveiwed.   ROS:   Please see the history of present illness.    All other systems reviewed and are negative.  EKGs/Labs/Other Studies Reviewed:    The following studies were reviewed today:  Jan 16, 2023 hemoglobin 7.9  September 25, 2022 echo EF 55-60 RV function normal Severely dilated left atrium Mildly dilated right atrium Moderate to severe aortic valve stenosis  September 27, 2022 left and right heart catheterization Patent LIMA Patent RIMA Occluded vein to the ramus intermediate branch Native circumflex is patent Mild to moderate aortic stenosis, mean gradient 18, wedge pressure 24  December 31, 2022 EKG shows atrial fibrillation with a ventricular rate of 74 bpm.   Physical Exam:    VS:  There were no vitals taken for this visit.    Wt Readings from Last 3 Encounters:  01/29/23 197 lb 1.9 oz (89.4 kg)   01/10/23 195 lb 0.6 oz (88.5 kg)  01/08/23 195 lb (88.5 kg)     GEN: *** Well nourished, well developed in no acute distress CARDIAC: ***Irregularly irregular, no murmurs, rubs, gallops RESPIRATORY:  Clear to auscultation without rales, wheezing or rhonchi       ASSESSMENT AND PLAN:    1. Permanent atrial fibrillation (HCC)   2. Anemia due to chronic blood loss   3. Coronary artery disease involving native coronary artery of native heart without angina pectoris     #Permanent atrial fibrillation Rate controlled.  Has been intolerant to anticoagulation due to severe GI bleeding requiring blood transfusion.  I discussed the role of left atrial appendage occlusion for stroke risk mitigation.  He is interested in proceeding with watchman.  We discussed the various anticoagulation strategies following Watchman.  We discussed DAPT, full-strength anticoagulation and half dose anticoagulation.  Given the mechanism of his anemia being GI related, would favor avoiding aspirin or Plavix.  I discussed the data available supporting half dose Eliquis in patients after watchman implant.  He understands that this is not currently in the guidelines but there are data available showing half dose Eliquis protects the Watchman device from thrombus and has a lower bleeding risk in many patients.  ----------  I have seen Leonard Cisneros in the office today who is being considered for a Watchman left atrial appendage closure device. I believe they will benefit from this procedure given their history of atrial fibrillation, CHA2DS2-VASc score of 5  and unadjusted ischemic stroke rate of 6.7% per year. Unfortunately, the patient is not felt to be a long term anticoagulation candidate secondary to severe GI bleeding. The patient's chart has been reviewed and I feel that they would be a candidate for short term oral anticoagulation after Watchman implant.   It is my belief that after undergoing a LAA closure  procedure, Leonard Cisneros will not need long term anticoagulation which eliminates anticoagulation side effects and major bleeding risk.   Procedural risks for the Watchman implant have been reviewed with the patient including a 0.5% risk of stroke, <1% risk of perforation and <1% risk of device embolization. Other risks include bleeding, vascular damage, tamponade, worsening renal function, and death. The patient understands these risk and wishes to proceed.     The published clinical data on the safety and effectiveness of WATCHMAN include but are not limited to the following: - Holmes DR, Everlene Farrier, Sick P et al. for the PROTECT AF Investigators. Percutaneous closure of the left atrial appendage versus warfarin therapy for prevention of stroke in patients with atrial fibrillation: a randomised non-inferiority trial. Lancet 2009; 374: 534-42. Everlene Farrier, Doshi SK, Isa Rankin D et al. on behalf of the PROTECT AF Investigators. Percutaneous Left Atrial Appendage Closure for Stroke Prophylaxis in Patients With Atrial Fibrillation 2.3-Year Follow-up of the PROTECT AF (Watchman Left Atrial Appendage System for Embolic Protection in Patients With Atrial Fibrillation) Trial. Circulation 2013; 127:720-729. - Alli O, Doshi S,  Kar S, Reddy VY, Sievert H et al. Quality of Life Assessment in the Randomized PROTECT AF (Percutaneous Closure of the Left Atrial Appendage Versus Warfarin Therapy for Prevention of Stroke in Patients With Atrial Fibrillation) Trial of Patients at Risk for Stroke With Nonvalvular Atrial Fibrillation. J Am Coll Cardiol 2013; 61:1790-8. Aline August DR, Mia [place], Price M, Whisenant B, Sievert H, Doshi S, Huber K, Reddy V. Prospective randomized evaluation of the Watchman left atrial appendage Device in patients with atrial fibrillation versus long-term warfarin therapy; the PREVAIL trial. Journal of the [employer] of Cardiology, Vol. 4, No. 1, 2014, 1-11. - Kar S, Doshi SK, Sadhu A,  Horton R, Osorio J et al. Primary outcome evaluation of a next-generation left atrial appendage closure device: results from the PINNACLE FLX trial. Circulation 2021;143(18)1754-1762.    After today's visit with the patient which was dedicated solely for shared decision making visit regarding LAA closure device, the patient decided to proceed with the LAA appendage closure procedure scheduled to be done in the near future at [facility]. Prior to the procedure, I would like to obtain a gated CT scan of the chest with contrast timed for PV/LA visualization.   Additionally, the patient will need an updated echo.  HAS-BLED score 3 Hypertension Yes  Abnormal renal and liver function (Dialysis, transplant, Cr >2.26 mg/dL /Cirrhosis or Bilirubin >2x Normal or AST/ALT/AP >3x Normal) No  Stroke No  Bleeding Yes  Labile INR (Unstable/high INR) No  Elderly (>65) Yes  Drugs or alcohol (? 8 drinks/week, anti-plt or NSAID) No   CHA2DS2-VASc Score = 5  The patient's score is based upon: CHF History: 1 HTN History: 1 Diabetes History: 0 Stroke History: 0 Vascular Disease History: 1 Age Score: 2 Gender Score: 0    #Coronary artery disease No ischemic symptoms today.  #Aortic stenosis Repeat echo prior to watchman implant to reassess severity.       Signed, Rossie Muskrat. Lalla Brothers, MD, [facility], [facility] 02/10/2023 4:10 PM  Electrophysiology [facility]

## 2023-02-11 ENCOUNTER — Ambulatory Visit: Payer: Medicare HMO | Attending: Cardiovascular Disease | Admitting: Cardiology

## 2023-02-11 ENCOUNTER — Encounter: Payer: Self-pay | Admitting: Cardiology

## 2023-02-11 ENCOUNTER — Other Ambulatory Visit: Payer: Self-pay | Admitting: *Deleted

## 2023-02-11 VITALS — BP 90/50 | HR 104 | Ht 71.0 in | Wt 192.0 lb

## 2023-02-11 DIAGNOSIS — I4821 Permanent atrial fibrillation: Secondary | ICD-10-CM

## 2023-02-11 DIAGNOSIS — I2510 Atherosclerotic heart disease of native coronary artery without angina pectoris: Secondary | ICD-10-CM | POA: Diagnosis not present

## 2023-02-11 DIAGNOSIS — D500 Iron deficiency anemia secondary to blood loss (chronic): Secondary | ICD-10-CM | POA: Diagnosis not present

## 2023-02-11 MED ORDER — METOPROLOL SUCCINATE ER 25 MG PO TB24: 25.0000 mg | ORAL_TABLET | Freq: Every day | ORAL | 2 refills | Status: DC

## 2023-02-11 MED ORDER — POTASSIUM CHLORIDE CRYS ER 10 MEQ PO TBCR: 30.0000 meq | EXTENDED_RELEASE_TABLET | Freq: Every day | ORAL | 2 refills | Status: DC

## 2023-02-11 NOTE — Patient Instructions (Signed)
Medication Instructions:  Your physician recommends that you continue on your current medications as directed. Please refer to the Current Medication list given to you today.  *If you need a refill on your cardiac medications before your next appointment, please call your pharmacy*  Lab Work: TODAY: BMET  Testing/Procedures: Your physician has requested that you have cardiac CT. Cardiac computed tomography (CT) is a painless test that uses an x-ray machine to take clear, detailed pictures of your heart. For further information please visit [email or web address] Please follow instruction sheet as given.  Follow-Up: At [facility], you and your health needs are our priority.  As part of our continuing mission to provide you with exceptional heart care, we have created designated Provider Care Teams.  These Care Teams include your primary Cardiologist (physician) and Advanced Practice Providers (APPs -  Physician Assistants and Nurse Practitioners) who all work together to provide you with the care you need, when you need it.   Your next appointment:   You will be contacted by Nurse Navigator, Karsten Fells to schedule your pre-procedure visit and procedure date. If you have any questions she can be reached at [phone].

## 2023-02-12 ENCOUNTER — Encounter: Payer: Self-pay | Admitting: Family Medicine

## 2023-02-12 ENCOUNTER — Ambulatory Visit (INDEPENDENT_AMBULATORY_CARE_PROVIDER_SITE_OTHER): Payer: Medicare HMO | Admitting: Family Medicine

## 2023-02-12 VITALS — BP 102/68 | HR 120 | Ht 71.0 in | Wt 194.1 lb

## 2023-02-12 DIAGNOSIS — R5383 Other fatigue: Secondary | ICD-10-CM

## 2023-02-12 DIAGNOSIS — E559 Vitamin D deficiency, unspecified: Secondary | ICD-10-CM | POA: Diagnosis not present

## 2023-02-12 DIAGNOSIS — I872 Venous insufficiency (chronic) (peripheral): Secondary | ICD-10-CM

## 2023-02-12 DIAGNOSIS — L89310 Pressure ulcer of right buttock, unstageable: Secondary | ICD-10-CM | POA: Diagnosis not present

## 2023-02-12 LAB — BASIC METABOLIC PANEL
BUN/Creatinine Ratio: 10 (ref 10–24)
BUN: 11 mg/dL (ref 8–27)
CO2: 26 mmol/L (ref 20–29)
Calcium: 8.6 mg/dL (ref 8.6–10.2)
Chloride: 100 mmol/L (ref 96–106)
Creatinine, Ser: 1.09 mg/dL (ref 0.76–1.27)
Glucose: 104 mg/dL — ABNORMAL HIGH (ref 70–99)
Potassium: 4 mmol/L (ref 3.5–5.2)
Sodium: 138 mmol/L (ref 134–144)
eGFR: 68 mL/min/{1.73_m2} (ref >59)

## 2023-02-12 MED ORDER — SILVER SULFADIAZINE 1 % EX CREA
1.0000 | TOPICAL_CREAM | Freq: Every day | CUTANEOUS | 0 refills | Status: DC
Start: 2023-02-12 — End: 2023-06-14

## 2023-02-12 NOTE — Assessment & Plan Note (Signed)
Clear drainage from the wound noted on physical examination of the right lower extremity No evidence of infection reported and noted on physical examination Educated on signs and symptoms of infection and to report to the ED if having increased redness, pus, swelling and pain  Encouraged to continue home wound care regimen and to follow-up on the referral placed to wound care

## 2023-02-12 NOTE — Assessment & Plan Note (Addendum)
Reports increased fatigue as of lately No systematic symptoms reported or and unintentional weight loss reported Appetite is intact He will be following up with cardiology in the week and report compliance with current treatment regiment Will assess TSH, CBC, and BMP levels today Encouraged heart healthy diet with increased physical activity

## 2023-02-12 NOTE — Patient Instructions (Addendum)
I appreciate the opportunity to provide care to you today!    Follow up:  1 month  Labs: please stop by the lab today to get your blood drawn (CBC, TSH, Vit D)  Decubitus ulcer of buttock, right Normal Saline Soaks and Dressing  1)Soak clean gauze with the saline solution then squeeze out the excess fluid so the gauze is wet  but not dripping.  3. Apply the soaked gauze directly on the wound for 5 minutes then pat the area dry.  4. Apply a generous amount of the cream (silver sulfadiazine 1% cream) on a Q-tip and spreading it  around the wound to make a complete covering. 5. Apply a none-stick / none adherent dressing, (such as "Telfa") and tape it in place. 6. Follow this procedure twice each day.   Do not let the wound dry out. Keep it covered with Topical antiseptic agents (silver sulfadiazine 1% cream)     I recommend high-protein and high-calorie diet to facilitate wound healing   Referrals today-  wound care at [facility]   Please continue to a heart-healthy diet and increase your physical activities. Try to exercise for at least five days a week.      It was a pleasure to see you and I look forward to continuing to work together on your health and well-being. Please do not hesitate to call the office if you need care or have questions about your care.   Have a wonderful day and week. With Gratitude, [name] MSN, FNP-BC

## 2023-02-12 NOTE — Assessment & Plan Note (Signed)
Onset of symptoms a month ago Reports pain with certain movements No evidence of infection noted on physical examination Encouraged wound care with saline soaks and dressing Normal Saline Soaks and Dressing silver sulfadiazine 1% cream ordered Encouraged high-protein diet to add to the right when healing Referral placed to wound care

## 2023-02-12 NOTE — Progress Notes (Signed)
Established Patient Office Visit  Subjective:  Patient ID: Leonard Cisneros, male    DOB: Dec 10, 1940  Age: 82 y.o. MRN: [id number]  CC:  Chief Complaint  Patient presents with   Chronic Care Management    2 week f/u for edema in legs.    HPI Leonard Cisneros is a 82 y.o. male with past medical history of essential hypertension, status dermatitis of both legs presents for f/u of  chronic medical conditions. For the details of today's visit, please refer to the assessment and plan.     Past Medical History:  Diagnosis Date   Aortic stenosis    Atrial flutter (HCC)    a. s/p remote ablation by Dr. Ladona Ridgel.   CAD (coronary artery disease)    a. s/p CABG 2003. b. 01/2016: unstable angina - s/p BMS to SVG-ramus intermediate, patent LIMA-mLAD, patent RIMA-dRCA.    Chronic lower back pain    Essential hypertension    Hyperlipidemia    Ischemic cardiomyopathy    a. Cath 01/2016 -  EF 50-55% with mild distal inferior hypocontractility.   Peripheral neuropathy 04/04/2014   Permanent atrial fibrillation [facility])     Past Surgical History:  Procedure Laterality Date   BACK SURGERY     BIOPSY  11/21/2022   Procedure: BIOPSY;  Surgeon: Napoleon Form, MD;  Location: [facility];  Service: Gastroenterology;;   CARDIAC CATHETERIZATION  ~ 2000; 2003   CARDIAC CATHETERIZATION N/A 02/01/2016   Procedure: Left Heart Cath and Cors/Grafts Angiography;  Surgeon: Lennette Bihari, MD;  Location: [facility] INVASIVE CV LAB;  Service: Cardiovascular;  Laterality: N/A;   CARDIAC CATHETERIZATION N/A 02/01/2016   Procedure: Coronary Stent Intervention;  Surgeon: Lennette Bihari, MD;  Location: [facility] INVASIVE CV LAB;  Service: Cardiovascular;  Laterality: N/A;   CATARACT EXTRACTION W/PHACO Left 02/09/2019   Procedure: CATARACT EXTRACTION PHACO AND INTRAOCULAR LENS PLACEMENT (IOC);  Surgeon: Fabio Pierce, MD;  Location: [facility] ORS;  Service: Ophthalmology;  Laterality: Left;  CDE: 27.70   CATARACT EXTRACTION W/PHACO Right  02/23/2019   Procedure: CATARACT EXTRACTION PHACO AND INTRAOCULAR LENS PLACEMENT RIGHT EYE;  Surgeon: Fabio Pierce, MD;  Location: [facility] ORS;  Service: Ophthalmology;  Laterality: Right;  CDE: 10.15   COLONOSCOPY  06/04/2002   Normal colon and rectum   COLONOSCOPY  07/08/2012   Procedure: COLONOSCOPY;  Surgeon: Corbin Ade, MD;  Location: [facility] ENDO SUITE;  Service: Endoscopy;  Laterality: N/A;  11:30   COLONOSCOPY N/A 10/02/2017   Procedure: COLONOSCOPY;  Surgeon: Corbin Ade, MD;  Location: [facility];  Service: Endoscopy;  Laterality: N/A;  10:30am   CORONARY ANGIOPLASTY WITH STENT PLACEMENT  02/01/2016   CORONARY ARTERY BYPASS GRAFT  2003   LIMA to LAD,LIMA to distal RCA,SVG to ramus intermediate vessel.   ESOPHAGOGASTRODUODENOSCOPY (EGD) WITH PROPOFOL N/A 11/21/2022   Procedure: ESOPHAGOGASTRODUODENOSCOPY (EGD) WITH PROPOFOL;  Surgeon: Napoleon Form, MD;  Location: [facility];  Service: Gastroenterology;  Laterality: N/A;   ESOPHAGOGASTRODUODENOSCOPY (EGD) WITH PROPOFOL N/A 12/27/2022   Procedure: ESOPHAGOGASTRODUODENOSCOPY (EGD) WITH PROPOFOL;  Surgeon: Lanelle Bal, DO;  Location: [facility];  Service: Endoscopy;  Laterality: N/A;   FEMORAL REVISION Right 03/30/2019   Procedure: Femoral revision right total hip arthroplasty;  Surgeon: Ollen Gross, MD;  Location: [facility] ORS;  Service: Orthopedics;  Laterality: Right;    FOREIGN BODY REMOVAL Right 04/02/2014   Procedure: FOREIGN BODY REMOVAL RIGHT FOOT;  Surgeon: Dalia Heading, MD;  Location: [facility] ORS;  Service:  General;  Laterality: Right;   JOINT REPLACEMENT     JOINT REPLACEMENT     LUMBAR DISC SURGERY     REVISION TOTAL HIP ARTHROPLASTY Bilateral 2005-2012   right-left   RIGHT/LEFT HEART CATH AND CORONARY/GRAFT ANGIOGRAPHY N/A 09/27/2022   Procedure: RIGHT/LEFT HEART CATH AND CORONARY/GRAFT ANGIOGRAPHY;  Surgeon: [place]az[place], Peter M, MD;  [facility] INVASIVE CV LAB;  Service: Cardiovascular;  Laterality: N/A;    SHOULDER OPEN ROTATOR CUFF REPAIR Right    TOTAL HIP ARTHROPLASTY Right 1999   TOTAL HIP ARTHROPLASTY Left 2008   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    Family History  Problem Relation Age of Onset   Heart attack Brother    Colon cancer Neg Hx     Social History   Socioeconomic History   Marital status: Married    Spouse name: Larita Fife   Number of children: 2   Years of education: Not on file   Highest education level: Bachelor's degree (e.g., BA, AB, BS)  Occupational History   Occupation: PT Holiday representative: RETIRED  Tobacco Use   Smoking status: Former    Packs/day: 0.50    Years: 2.00    Additional pack years: 0.00    Total pack years: 1.00    Types: Cigarettes    Quit date: 09/03/1966    Years since quitting: 56.4   Smokeless tobacco: Never  Vaping Use   Vaping Use: Never used  Substance and Sexual Activity   Alcohol use: Yes    Alcohol/week: 11.0 standard drinks of alcohol    Types: 7 Glasses of wine, 4 Cans of beer per week    Comment: a glass of wine with dinner/ beer on the weekend   Drug use: No   Sexual activity: Not on file  Other Topics Concern   Not on file  Social History Narrative   Lives with his wife.   Social Determinants of Health   Financial Resource Strain: Low Risk  (01/04/2023)   Overall Financial Resource Strain (CARDIA)    Difficulty of Paying Living Expenses: Not hard at all  Food Insecurity: No Food Insecurity (01/04/2023)   Hunger Vital Sign    Worried About Running Out of Food in the Last Year: Never true    Ran Out of Food in the Last Year: Never true  Transportation Needs: No Transportation Needs (01/04/2023)   PRAPARE - Administrator, Civil Service (Medical): No    Lack of Transportation (Non-Medical): No   Physical Activity: Unknown (11/25/2022)   Exercise Vital Sign    Days of Exercise per Week: 0 days    Minutes of Exercise per Session: Not on file  Stress: No Stress Concern Present (01/04/2023)   [employer] of Occupational Health - Occupational Stress Questionnaire    Feeling of Stress : Not at all  Social Connections: Moderately Integrated (11/25/2022)   Social Connection and Isolation Panel [NHANES]    Frequency of Communication with Friends and Family: Once a week    Frequency of Social Gatherings with Friends and Family: Once a week    Attends Religious Services: More than 4 times per year    Active Member of [employer] or Organizations: Yes    Attends Banker Meetings: More than 4 times per year    Marital Status:  Married  Intimate Partner Violence: Not At Risk (12/25/2022)   Humiliation, Afraid, Rape, and Kick questionnaire    Fear of Current or Ex-Partner: No    Emotionally Abused: No    Physically Abused: No    Sexually Abused: No    Outpatient Medications Prior to Visit  Medication Sig Dispense Refill   atorvastatin (LIPITOR) 80 MG tablet TAKE 1 TABLET BY MOUTH EVERY DAY (Patient taking differently: Take 80 mg by mouth daily.) 90 tablet 1   Iron, Ferrous Sulfate, 325 (65 Fe) MG TABS Take 325 mg by mouth daily. 30 tablet 2   metoprolol succinate (TOPROL-XL) 25 MG 24 hr tablet Take 1 tablet (25 mg total) by mouth daily. 90 tablet 2   nitroGLYCERIN (NITROSTAT) 0.4 MG SL tablet Place 1 tablet (0.4 mg total) under the tongue every 5 (five) minutes x 3 doses as needed for chest pain (if no relief after 3rd dose, proceed to the ED for an evaluation or call 911). 25 tablet 3   pantoprazole (PROTONIX) 40 MG tablet Take 1 tablet (40 mg total) by mouth 2 (two) times daily. 180 tablet 0   potassium chloride (KLOR-CON M10) 10 MEQ tablet Take 3 tablets (30 mEq total) by mouth daily. 270 tablet 2   torsemide (DEMADEX) 20 MG tablet Take 3 tablets (60 mg total) by mouth daily. 270  tablet 3   UNABLE TO FIND Med Name: Med Name:  Compression stocking 20-30 mmHg  ICD:M79.89 1 each 0   No facility-administered medications prior to visit.    Allergies  Allergen Reactions   Augmentin [Amoxicillin-Pot Clavulanate] Nausea And Vomiting and Other (See Comments)    Has patient had a PCN reaction causing immediate rash, facial/tongue/throat swelling, SOB or lightheadedness with hypotension: Unknown Has patient had a PCN reaction causing severe rash involving mucus membranes or skin necrosis: No Has patient had a PCN reaction that required hospitalization: No Has patient had a PCN reaction occurring within the last 10 years: No If all of the above answers are "NO", then may proceed with Cephalosporin use.     ROS Review of Systems  Constitutional:  Negative for fatigue and fever.  Eyes:  Negative for visual disturbance.  Respiratory:  Negative for chest tightness and shortness of breath.   Cardiovascular:  Negative for chest pain and palpitations.  Skin:  Positive for wound.  Neurological:  Negative for dizziness and headaches.      Objective:    Physical Exam HENT:     Head: Normocephalic.     Right Ear: External ear normal.     Left Ear: External ear normal.     Nose: No congestion or rhinorrhea.     Mouth/Throat:     Mouth: Mucous membranes are moist.  Cardiovascular:     Rate and Rhythm: Regular rhythm.     Heart sounds: No murmur heard. Pulmonary:     Effort: No respiratory distress.     Breath sounds: Normal breath sounds.  Skin:    Findings: Lesion (18mm ulcer noted in the right buttuck and right lower extremity without prulent driange) present.  Neurological:     Mental Status: He is alert.     BP 102/68   Pulse (!) 120   Ht 5\' 11"  (1.803 m)   Wt 194 lb 1.9 oz (88.1 kg)   SpO2 94%   BMI 27.07 kg/m  Wt Readings from Last 3 Encounters:  02/12/23 194 lb 1.9 oz (88.1 kg)  02/11/23 192 lb (87.1 kg)  01/29/23 197 lb 1.9 oz (89.4 kg)    Lab  Results  Component Value Date   TSH 3.009 11/20/2022   Lab Results  Component Value Date   WBC 6.4 01/16/2023   HGB 7.9 (L) 01/16/2023   HCT 23.1 (L) 01/16/2023   MCV 102 (H) 01/16/2023   PLT 248 01/16/2023   Lab Results  Component Value Date   NA 138 02/11/2023   K 4.0 02/11/2023   CO2 26 02/11/2023   GLUCOSE 104 (H) 02/11/2023   BUN 11 02/11/2023   CREATININE 1.09 02/11/2023   BILITOT 2.3 (H) 01/10/2023   ALKPHOS 243 (H) 12/29/2022   AST 38 (H) 01/10/2023   ALT 34 01/10/2023   PROT 5.4 (L) 01/10/2023   ALBUMIN 2.6 (L) 12/29/2022   CALCIUM 8.6 02/11/2023   ANIONGAP 7 12/27/2022   EGFR 68 02/11/2023   Lab Results  Component Value Date   CHOL 110 11/12/2021   Lab Results  Component Value Date   HDL 74 11/12/2021   Lab Results  Component Value Date   LDLCALC 31 11/12/2021   Lab Results  Component Value Date   TRIG 23 11/12/2021   Lab Results  Component Value Date   CHOLHDL 1.5 11/12/2021   Lab Results  Component Value Date   HGBA1C 4.6 (L) 11/18/2022      Assessment & Plan:  Decubitus ulcer of buttock, right, unstageable (HCC) Assessment & Plan: Onset of symptoms a month ago Reports pain with certain movements No evidence of infection noted on physical examination Encouraged wound care with saline soaks and dressing Normal Saline Soaks and Dressing silver sulfadiazine 1% cream ordered Encouraged high-protein diet to add to the right when healing Referral placed to wound care    Orders: -     Silver sulfADIAZINE; Apply 1 Application topically daily.  Dispense: 50 g; Refill: 0 -     AMB referral to wound care center  Other fatigue Assessment & Plan: Reports increased fatigue as of lately No systematic symptoms reported or and unintentional weight loss reported Appetite is intact He will be following up with cardiology in the week and report compliance with current treatment regiment Will assess TSH, CBC, and BMP levels today Encouraged heart  healthy diet with increased physical activity  Orders: -     CBC with Differential/Platelet -     TSH + free T4 -     Iron, TIBC and Ferritin Panel  Stasis dermatitis of both legs Assessment & Plan: Clear drainage from the wound noted on physical examination of the right lower extremity No evidence of infection reported and noted on physical examination Educated on signs and symptoms of infection and to report to the ED if having increased redness, pus, swelling and pain  Encouraged to continue home wound care regimen and to follow-up on the referral placed to wound care   Vitamin D deficiency -     VITAMIN D 25 Hydroxy (Vit-D Deficiency, Fractures)    Follow-up: Return in about 1 month (around 03/14/2023).   [name], FNP

## 2023-02-13 ENCOUNTER — Other Ambulatory Visit (HOSPITAL_COMMUNITY): Payer: Self-pay

## 2023-02-13 ENCOUNTER — Telehealth: Payer: Self-pay | Admitting: Family Medicine

## 2023-02-13 ENCOUNTER — Encounter (HOSPITAL_COMMUNITY): Payer: Self-pay

## 2023-02-13 ENCOUNTER — Other Ambulatory Visit: Payer: Self-pay

## 2023-02-13 ENCOUNTER — Encounter: Payer: Self-pay | Admitting: Cardiology

## 2023-02-13 ENCOUNTER — Emergency Department (HOSPITAL_COMMUNITY)
Admission: EM | Admit: 2023-02-13 | Discharge: 2023-02-13 | Discharge status: Against Medical Advice | Payer: Medicare HMO | Attending: Emergency Medicine | Admitting: Emergency Medicine

## 2023-02-13 DIAGNOSIS — Z5321 Procedure and treatment not carried out due to patient leaving prior to being seen by health care provider: Secondary | ICD-10-CM | POA: Diagnosis not present

## 2023-02-13 DIAGNOSIS — D509 Iron deficiency anemia, unspecified: Secondary | ICD-10-CM | POA: Diagnosis not present

## 2023-02-13 DIAGNOSIS — R7989 Other specified abnormal findings of blood chemistry: Secondary | ICD-10-CM | POA: Diagnosis not present

## 2023-02-13 DIAGNOSIS — R748 Abnormal levels of other serum enzymes: Secondary | ICD-10-CM | POA: Diagnosis not present

## 2023-02-13 LAB — CBC WITH DIFFERENTIAL/PLATELET
Basophils Absolute: 0 10*3/uL (ref 0.0–0.2)
Basos: 1 %
EOS (ABSOLUTE): 0.2 10*3/uL (ref 0.0–0.4)
Eos: 3 %
Hematocrit: 19.8 % — ABNORMAL LOW (ref 37.5–51.0)
Hemoglobin: 6.7 g/dL — CL (ref 13.0–17.7)
Immature Grans (Abs): 0.1 10*3/uL (ref 0.0–0.1)
Immature Granulocytes: 1 %
Lymphocytes Absolute: 0.7 10*3/uL (ref 0.7–3.1)
Lymphs: 11 %
MCH: 34.7 pg — ABNORMAL HIGH (ref 26.6–33.0)
MCHC: 33.8 g/dL (ref 31.5–35.7)
MCV: 103 fL — ABNORMAL HIGH (ref 79–97)
Monocytes Absolute: 0.6 10*3/uL (ref 0.1–0.9)
Monocytes: 9 %
NRBC: 1 % — ABNORMAL HIGH (ref 0–0)
Neutrophils Absolute: 4.9 10*3/uL (ref 1.4–7.0)
Neutrophils: 75 %
Platelets: 221 10*3/uL (ref 150–450)
RBC: 1.93 x10E6/uL — CL (ref 4.14–5.80)
RDW: 21.3 % — ABNORMAL HIGH (ref 11.6–15.4)
WBC: 6.5 10*3/uL (ref 3.4–10.8)

## 2023-02-13 LAB — IRON,TIBC AND FERRITIN PANEL
Ferritin: 489 ng/mL — ABNORMAL HIGH (ref 30–400)
Iron Saturation: 38 % (ref 15–55)
Iron: 69 ug/dL (ref 38–169)
Total Iron Binding Capacity: 182 ug/dL — ABNORMAL LOW (ref 250–450)
UIBC: 113 ug/dL (ref 111–343)

## 2023-02-13 LAB — TSH+FREE T4
Free T4: 1.26 ng/dL (ref 0.82–1.77)
TSH: 2.98 u[IU]/mL (ref 0.450–4.500)

## 2023-02-13 NOTE — Telephone Encounter (Signed)
I called and spoke with the patient's wife, informing her of the critical hemoglobin level of 6.7 and RBC count of 1.93, and advised her to take the patient to ED for blood transfusions. I provided reports that in the ED provider.

## 2023-02-13 NOTE — ED Triage Notes (Addendum)
Pt sent to ER for transfusion with a hgb of 6.7 from doctors office.  Pt states "Why would they send me here tonight? Why could I not just come to the infusion center tomorrow?"

## 2023-02-13 NOTE — ED Notes (Signed)
Pt states he does not want to wait here and he will be back in the morning.  Pt verbalized understanding of need for blood transfusion and risks.

## 2023-02-14 ENCOUNTER — Inpatient Hospital Stay (HOSPITAL_COMMUNITY)
Admission: EM | Admit: 2023-02-14 | Discharge: 2023-02-16 | DRG: 291 | Disposition: A | Discharge status: Home / Self Care | Payer: Medicare HMO | Attending: Family Medicine | Admitting: Family Medicine

## 2023-02-14 ENCOUNTER — Emergency Department (HOSPITAL_COMMUNITY): Payer: Medicare HMO

## 2023-02-14 ENCOUNTER — Encounter (HOSPITAL_COMMUNITY): Payer: Self-pay | Admitting: Emergency Medicine

## 2023-02-14 ENCOUNTER — Other Ambulatory Visit: Payer: Self-pay

## 2023-02-14 DIAGNOSIS — L89152 Pressure ulcer of sacral region, stage 2: Secondary | ICD-10-CM | POA: Diagnosis present

## 2023-02-14 DIAGNOSIS — D649 Anemia, unspecified: Secondary | ICD-10-CM | POA: Diagnosis not present

## 2023-02-14 DIAGNOSIS — I5033 Acute on chronic diastolic (congestive) heart failure: Secondary | ICD-10-CM

## 2023-02-14 DIAGNOSIS — J811 Chronic pulmonary edema: Secondary | ICD-10-CM | POA: Diagnosis not present

## 2023-02-14 DIAGNOSIS — K2970 Gastritis, unspecified, without bleeding: Secondary | ICD-10-CM | POA: Diagnosis present

## 2023-02-14 DIAGNOSIS — E871 Hypo-osmolality and hyponatremia: Secondary | ICD-10-CM | POA: Diagnosis present

## 2023-02-14 DIAGNOSIS — R0602 Shortness of breath: Secondary | ICD-10-CM | POA: Diagnosis not present

## 2023-02-14 DIAGNOSIS — I509 Heart failure, unspecified: Secondary | ICD-10-CM | POA: Diagnosis not present

## 2023-02-14 DIAGNOSIS — R748 Abnormal levels of other serum enzymes: Secondary | ICD-10-CM | POA: Diagnosis not present

## 2023-02-14 DIAGNOSIS — K222 Esophageal obstruction: Secondary | ICD-10-CM | POA: Diagnosis present

## 2023-02-14 DIAGNOSIS — Z87891 Personal history of nicotine dependence: Secondary | ICD-10-CM

## 2023-02-14 DIAGNOSIS — I255 Ischemic cardiomyopathy: Secondary | ICD-10-CM | POA: Diagnosis present

## 2023-02-14 DIAGNOSIS — D62 Acute posthemorrhagic anemia: Secondary | ICD-10-CM | POA: Diagnosis present

## 2023-02-14 DIAGNOSIS — Z955 Presence of coronary angioplasty implant and graft: Secondary | ICD-10-CM

## 2023-02-14 DIAGNOSIS — R5383 Other fatigue: Secondary | ICD-10-CM | POA: Diagnosis not present

## 2023-02-14 DIAGNOSIS — I4821 Permanent atrial fibrillation: Secondary | ICD-10-CM | POA: Diagnosis present

## 2023-02-14 DIAGNOSIS — I739 Peripheral vascular disease, unspecified: Secondary | ICD-10-CM | POA: Diagnosis present

## 2023-02-14 DIAGNOSIS — I2510 Atherosclerotic heart disease of native coronary artery without angina pectoris: Secondary | ICD-10-CM | POA: Diagnosis present

## 2023-02-14 DIAGNOSIS — E785 Hyperlipidemia, unspecified: Secondary | ICD-10-CM | POA: Diagnosis present

## 2023-02-14 DIAGNOSIS — E876 Hypokalemia: Secondary | ICD-10-CM | POA: Diagnosis present

## 2023-02-14 DIAGNOSIS — I110 Hypertensive heart disease with heart failure: Secondary | ICD-10-CM | POA: Diagnosis not present

## 2023-02-14 DIAGNOSIS — L8990 Pressure ulcer of unspecified site, unspecified stage: Secondary | ICD-10-CM | POA: Insufficient documentation

## 2023-02-14 DIAGNOSIS — J90 Pleural effusion, not elsewhere classified: Secondary | ICD-10-CM | POA: Diagnosis not present

## 2023-02-14 DIAGNOSIS — K269 Duodenal ulcer, unspecified as acute or chronic, without hemorrhage or perforation: Secondary | ICD-10-CM | POA: Diagnosis present

## 2023-02-14 DIAGNOSIS — I872 Venous insufficiency (chronic) (peripheral): Secondary | ICD-10-CM | POA: Diagnosis present

## 2023-02-14 DIAGNOSIS — E878 Other disorders of electrolyte and fluid balance, not elsewhere classified: Secondary | ICD-10-CM | POA: Diagnosis present

## 2023-02-14 DIAGNOSIS — Z8249 Family history of ischemic heart disease and other diseases of the circulatory system: Secondary | ICD-10-CM

## 2023-02-14 DIAGNOSIS — Z79899 Other long term (current) drug therapy: Secondary | ICD-10-CM

## 2023-02-14 DIAGNOSIS — Z96643 Presence of artificial hip joint, bilateral: Secondary | ICD-10-CM | POA: Diagnosis present

## 2023-02-14 DIAGNOSIS — I350 Nonrheumatic aortic (valve) stenosis: Secondary | ICD-10-CM | POA: Diagnosis present

## 2023-02-14 DIAGNOSIS — Z951 Presence of aortocoronary bypass graft: Secondary | ICD-10-CM

## 2023-02-14 LAB — TYPE AND SCREEN
ABO/RH(D): O POS
Antibody Screen: POSITIVE
Unit division: 0

## 2023-02-14 LAB — COMPREHENSIVE METABOLIC PANEL
ALT: 31 U/L (ref 0–44)
AST: 42 U/L — ABNORMAL HIGH (ref 15–41)
Albumin: 3.1 g/dL — ABNORMAL LOW (ref 3.5–5.0)
Alkaline Phosphatase: 356 U/L — ABNORMAL HIGH (ref 38–126)
Anion gap: 9 (ref 5–15)
BUN: 12 mg/dL (ref 8–23)
CO2: 26 mmol/L (ref 22–32)
Calcium: 8.1 mg/dL — ABNORMAL LOW (ref 8.9–10.3)
Chloride: 97 mmol/L — ABNORMAL LOW (ref 98–111)
Creatinine, Ser: 1.14 mg/dL (ref 0.61–1.24)
GFR, Estimated: >60 mL/min (ref >60)
Glucose, Bld: 117 mg/dL — ABNORMAL HIGH (ref 70–99)
Potassium: 3.4 mmol/L — ABNORMAL LOW (ref 3.5–5.1)
Sodium: 132 mmol/L — ABNORMAL LOW (ref 135–145)
Total Bilirubin: 3.1 mg/dL — ABNORMAL HIGH (ref 0.3–1.2)
Total Protein: 6.1 g/dL — ABNORMAL LOW (ref 6.5–8.1)

## 2023-02-14 LAB — CBC WITH DIFFERENTIAL/PLATELET
Abs Immature Granulocytes: 0.07 10*3/uL (ref 0.00–0.07)
Basophils Absolute: 0 10*3/uL (ref 0.0–0.1)
Basophils Relative: 1 %
Eosinophils Absolute: 0.2 10*3/uL (ref 0.0–0.5)
Eosinophils Relative: 3 %
HCT: 22 % — ABNORMAL LOW (ref 39.0–52.0)
Hemoglobin: 7.2 g/dL — ABNORMAL LOW (ref 13.0–17.0)
Immature Granulocytes: 1 %
Lymphocytes Relative: 14 %
Lymphs Abs: 0.8 10*3/uL (ref 0.7–4.0)
MCH: 37.1 pg — ABNORMAL HIGH (ref 26.0–34.0)
MCHC: 32.7 g/dL (ref 30.0–36.0)
MCV: 113.4 fL — ABNORMAL HIGH (ref 80.0–100.0)
Monocytes Absolute: 0.6 10*3/uL (ref 0.1–1.0)
Monocytes Relative: 11 %
Neutro Abs: 3.7 10*3/uL (ref 1.7–7.7)
Neutrophils Relative %: 70 %
Platelets: 227 10*3/uL (ref 150–400)
RBC: 1.94 MIL/uL — ABNORMAL LOW (ref 4.22–5.81)
RDW: 25.7 % — ABNORMAL HIGH (ref 11.5–15.5)
WBC: 5.4 10*3/uL (ref 4.0–10.5)
nRBC: 0 % (ref 0.0–0.2)

## 2023-02-14 LAB — ANTI-SMOOTH MUSCLE ANTIBODY, IGG: Smooth Muscle Ab: 5 Units (ref 0–19)

## 2023-02-14 LAB — PROTIME-INR
INR: 1.2 (ref 0.8–1.2)
Prothrombin Time: 15.2 seconds (ref 11.4–15.2)

## 2023-02-14 LAB — PREPARE RBC (CROSSMATCH)

## 2023-02-14 LAB — BRAIN NATRIURETIC PEPTIDE: B Natriuretic Peptide: 641 pg/mL — ABNORMAL HIGH (ref 0.0–100.0)

## 2023-02-14 LAB — ALKALINE PHOSPHATASE, ISOENZYMES: Alkaline Phosphatase: 436 IU/L — ABNORMAL HIGH (ref 44–121)

## 2023-02-14 LAB — TROPONIN I (HIGH SENSITIVITY)
Troponin I (High Sensitivity): 22 ng/L — ABNORMAL HIGH (ref <18)
Troponin I (High Sensitivity): 22 ng/L — ABNORMAL HIGH (ref <18)
Troponin I (High Sensitivity): 20 ng/L — ABNORMAL HIGH (ref <18)
Troponin I (High Sensitivity): 22 ng/L — ABNORMAL HIGH (ref <18)

## 2023-02-14 LAB — ANA W/REFLEX: Anti Nuclear Antibody (ANA): NEGATIVE

## 2023-02-14 MED ORDER — FUROSEMIDE 10 MG/ML IJ SOLN
80.0000 mg | Freq: Two times a day (BID) | INTRAMUSCULAR | Status: DC
  Administered 2023-02-14 – 2023-02-16 (×4): 80 mg via INTRAVENOUS
  Filled 2023-02-14 (×4): qty 8

## 2023-02-14 MED ORDER — SODIUM CHLORIDE 0.9% FLUSH: 3.0000 mL | INTRAVENOUS | Status: DC | PRN

## 2023-02-14 MED ORDER — SODIUM CHLORIDE 0.9% FLUSH
3.0000 mL | Freq: Two times a day (BID) | INTRAVENOUS | Status: DC
  Administered 2023-02-14 – 2023-02-16 (×4): 3 mL via INTRAVENOUS

## 2023-02-14 MED ORDER — SODIUM CHLORIDE 0.9 % IV SOLN: INTRAVENOUS | Status: DC | PRN

## 2023-02-14 MED ORDER — POTASSIUM CHLORIDE CRYS ER 20 MEQ PO TBCR
40.0000 meq | EXTENDED_RELEASE_TABLET | ORAL | Status: AC
  Administered 2023-02-14: 40 meq via ORAL
  Filled 2023-02-14: qty 2

## 2023-02-14 MED ORDER — PANTOPRAZOLE 80MG IVPB - SIMPLE MED
80.0000 mg | Freq: Once | INTRAVENOUS | Status: AC
  Administered 2023-02-14: 80 mg via INTRAVENOUS
  Filled 2023-02-14: qty 100

## 2023-02-14 MED ORDER — FUROSEMIDE 10 MG/ML IJ SOLN
40.0000 mg | Freq: Once | INTRAMUSCULAR | Status: AC
  Administered 2023-02-14: 40 mg via INTRAVENOUS
  Filled 2023-02-14: qty 4

## 2023-02-14 MED ORDER — ACETAMINOPHEN 325 MG PO TABS: 650.0000 mg | ORAL_TABLET | Freq: Four times a day (QID) | ORAL | Status: DC | PRN

## 2023-02-14 MED ORDER — PANTOPRAZOLE SODIUM 40 MG IV SOLR
40.0000 mg | Freq: Two times a day (BID) | INTRAVENOUS | Status: DC
  Administered 2023-02-14 – 2023-02-16 (×4): 40 mg via INTRAVENOUS
  Filled 2023-02-14 (×4): qty 10

## 2023-02-14 MED ORDER — ATORVASTATIN CALCIUM 40 MG PO TABS
80.0000 mg | ORAL_TABLET | Freq: Every day | ORAL | Status: DC
  Administered 2023-02-14 – 2023-02-16 (×2): 80 mg via ORAL
  Filled 2023-02-14 (×3): qty 2

## 2023-02-14 MED ORDER — ACETAMINOPHEN 650 MG RE SUPP: 650.0000 mg | Freq: Four times a day (QID) | RECTAL | Status: DC | PRN

## 2023-02-14 MED ORDER — METOPROLOL SUCCINATE ER 25 MG PO TB24
25.0000 mg | ORAL_TABLET | Freq: Every day | ORAL | Status: DC
  Administered 2023-02-16: 25 mg via ORAL
  Filled 2023-02-14 (×2): qty 1

## 2023-02-14 MED ORDER — POTASSIUM CHLORIDE CRYS ER 20 MEQ PO TBCR
40.0000 meq | EXTENDED_RELEASE_TABLET | Freq: Once | ORAL | Status: AC
  Administered 2023-02-14: 40 meq via ORAL
  Filled 2023-02-14: qty 2

## 2023-02-14 MED ORDER — SODIUM CHLORIDE 0.9 % IV SOLN: INTRAVENOUS | Status: DC

## 2023-02-14 MED ORDER — TRAZODONE HCL 50 MG PO TABS
50.0000 mg | ORAL_TABLET | Freq: Every evening | ORAL | Status: DC | PRN
  Administered 2023-02-14 – 2023-02-15 (×2): 50 mg via ORAL
  Filled 2023-02-14 (×2): qty 1

## 2023-02-14 MED ORDER — POLYETHYLENE GLYCOL 3350 17 G PO PACK: 17.0000 g | PACK | Freq: Every day | ORAL | Status: DC | PRN

## 2023-02-14 MED ORDER — ONDANSETRON HCL 4 MG/2ML IJ SOLN: 4.0000 mg | Freq: Four times a day (QID) | INTRAMUSCULAR | Status: DC | PRN

## 2023-02-14 MED ORDER — ONDANSETRON HCL 4 MG PO TABS: 4.0000 mg | ORAL_TABLET | Freq: Four times a day (QID) | ORAL | Status: DC | PRN

## 2023-02-14 MED ORDER — BISACODYL 10 MG RE SUPP: 10.0000 mg | Freq: Every day | RECTAL | Status: DC | PRN

## 2023-02-14 MED ORDER — SODIUM CHLORIDE 0.9% IV SOLUTION: Freq: Once | INTRAVENOUS | Status: DC

## 2023-02-14 NOTE — ED Triage Notes (Signed)
Pt here yesterday for blood transfusion due to hgb 6.7. pt came to ED last night but wanted to wait for infusion clinic due to having to wait so long in ED. Back today for same. Pt sob/alert and oriented. Ble swelling/redness noted with RLE bandaged to anterior using crutches. Color slightly jaundiced

## 2023-02-14 NOTE — Consult Note (Signed)
 Gastroenterology Consult   Referring Provider: No ref. provider found Primary Care Physician:  Zarwolo, Gloria, FNP Primary Gastroenterologist: Robert M.Rourk, MD  Patient ID: Leonard Cisneros; [id number]; 09/27/1940   Admit date: 02/14/2023  LOS: 0 days   Date of Consultation: 02/14/2023  Reason for Consultation: Anemia, heme positive stool  History of Present Illness   Leonard Cisneros is a 81 y.o. year old male with history of A-fib, previously on Eliquis (currently undergoing evaluation for Watchman device), ischemic cardiomyopathy, HTN, CAD s/p Gelsyn-3, HLD, moderate to severe aortic stenosis, and history of duodenal ulcer in March 2024 which was redemonstrated on EGD in April 2024 who presented to the ED at the recommendation of primary care provider given recent hemoglobin drop to 6.7 on 6/11.  Patient originally presented to the ED yesterday 6/12 but given long wait time he went home and represented this morning to receive blood transfusion.  ED Course: Labs -hemoglobin 7.2 in the ED, MCV 113, plts 227, sodium 132, potassium 3.4, albumin 3.1, AST 42, alk phos 356, T. bili 3.1, INR 1.2 Given his hemoglobin improved from 6.7 to 7.2 he has not received any blood.  Consult:  Patient reports he has been feeling fairly well other than some mild fatigue.  Was doing well doing yard work but has been slowly feeling a little bit more tired.  He denies any melena or BRBPR.  Shortness of breath is at baseline.  Denies any chest pain.  States he has a great appetite and has been compliant with his PPI.  He remains off Eliquis or any other blood thinner.  He is also continued on iron for which his stools do not appear to be black, he states his stools are normal brown color and he denies any constipation or diarrhea.  He also denies any nausea, vomiting, dysphagia, or abdominal pain.  No jaundice or pruritus.  His wife does report that he continues to have issues with wound healing to sore on  his right lower extremity and has been awaiting referral to wound center.  She states he is due to have a specialized CT of his chest/heart on Monday for further workup of possible Watchman device.   Patient originally presented to [facility] with shortness of breath, weight gain, edema, and melena and was transferred to [place] for GI evaluation in March 2024.  His hemoglobin at that time was 7.8.  He was started on Lasix for CHF exacerbation.  Anemia panel was unremarkable.  He was given 1 unit of PRBCs while inpatient and started on PPI.  He underwent EGD as noted below foe evaluation of heme positive stool and melena.    Recent admission at [facility] for chest pain and found to have significant symptomatic anemia with hemoglobin of 5 and heme positive stool.  Underwent EGD as outlined below.  Also treated with thoracentesis for improvement of shortness of breath while inpatient -removal 1.5 L.  Also had right upper quadrant ultrasound as outlined below.  Elevated liver enzymes while inpatient suspected to be secondary to congestive hepatopathy.  Advised to continue to follow liver enzymes and keep follow-up outpatient with cardiology.  Advise need for CBC 1 week after discharge.  Hemoglobin 8.3 on discharge.  AST 51, ALT 49, alk phos 243, T. bili 3.7 with indirect 2.4 and direct 1.3 on discharge.   Prior endoscopic evaluation: Colonoscopy in January 2019: -entire colon normal -no repeat colonoscopy due to age  EGD 11/21/2022 at  [facility]: -Z-line regular -Normal   esophagus -Gastritis s/p biopsy -Nonbleeding duodenal ulcer with clean ulcer base -Mucosal nodule in the duodenum s/p biopsy -Duodenal nodule with polypoid fragment of benign small bowel mucosa with focal foveolar metaplasia suggestive of peptic injury -Gastric biopsies negative for H. pylori or intestinal metaplasia. -Advised to avoid NSAIDs, resume Eliquis in 3 days and use PPI twice daily for 3 months.  EGD 12/27/22: -Mild Schatzki's  ring -Gastritis -Nonbleeding duodenal ulcer with clean ulcer base -Advised IV PPI while inpatient and DC on p.o. PPI twice daily.  May resume Eliquis   Most recent imaging: RUQ US 12/25/2022: -Cholelithiasis without additional changes to suggest acute cholecystitis -Right pleural effusion -Patent portal vein  Abdominal US 09/24/22 IMPRESSION: -Cholelithiasis without evidence of acute cholecystitis or biliary dilatation. -Probable fatty infiltration of liver as above. -RIGHT pleural effusion.    Past Medical History:  Diagnosis Date   Aortic stenosis    Atrial flutter (HCC)    a. s/p remote ablation by Dr. Taylor.   CAD (coronary artery disease)    a. s/p CABG 2003. b. 01/2016: unstable angina - s/p BMS to SVG-ramus intermediate, patent LIMA-mLAD, patent RIMA-dRCA.    Chronic lower back pain    Essential hypertension    Hyperlipidemia    Ischemic cardiomyopathy    a. Cath 01/2016 -  EF 50-55% with mild distal inferior hypocontractility.   Peripheral neuropathy 04/04/2014   Permanent atrial fibrillation (HCC)     Past Surgical History:  Procedure Laterality Date   BACK SURGERY     BIOPSY  11/21/2022   Procedure: BIOPSY;  Surgeon: Nandigam, Kavitha V, MD;  Location: [facility];  Service: Gastroenterology;;   CARDIAC CATHETERIZATION  ~ 2000; 2003   CARDIAC CATHETERIZATION N/A 02/01/2016   Procedure: Left Heart Cath and Cors/Grafts Angiography;  Surgeon: Thomas A Kelly, MD;  Location: [facility];  Service: Cardiovascular;  Laterality: N/A;   CARDIAC CATHETERIZATION N/A 02/01/2016   Procedure: Coronary Stent Intervention;  Surgeon: Thomas A Kelly, MD;  Location: [facility];  Service: Cardiovascular;  Laterality: N/A;   CATARACT EXTRACTION W/PHACO Left 02/09/2019   Procedure: CATARACT EXTRACTION PHACO AND INTRAOCULAR LENS PLACEMENT (IOC);  Surgeon: Wrzosek, James, MD;  Location: [facility];  Service: Ophthalmology;  Laterality: Left;  CDE: 27.70   CATARACT EXTRACTION  W/PHACO Right 02/23/2019   Procedure: CATARACT EXTRACTION PHACO AND INTRAOCULAR LENS PLACEMENT RIGHT EYE;  Surgeon: Wrzosek, James, MD;  Location: [facility];  Service: Ophthalmology;  Laterality: Right;  CDE: 10.15   COLONOSCOPY  06/04/2002   Normal colon and rectum   COLONOSCOPY  07/08/2012   Procedure: COLONOSCOPY;  Surgeon: Robert M Rourk, MD;  Location: [facility];  Service: Endoscopy;  Laterality: N/A;  11:30   COLONOSCOPY N/A 10/02/2017   Procedure: COLONOSCOPY;  Surgeon: Rourk, Robert M, MD;  Location: [facility];  Service: Endoscopy;  Laterality: N/A;  10:30am   CORONARY ANGIOPLASTY WITH STENT PLACEMENT  02/01/2016   CORONARY ARTERY BYPASS GRAFT  2003   LIMA to LAD,LIMA to distal RCA,SVG to ramus intermediate vessel.   ESOPHAGOGASTRODUODENOSCOPY (EGD) WITH PROPOFOL N/A 11/21/2022   Procedure: ESOPHAGOGASTRODUODENOSCOPY (EGD) WITH PROPOFOL;  Surgeon: Nandigam, Kavitha V, MD;  Location: [facility];  Service: Gastroenterology;  Laterality: N/A;   ESOPHAGOGASTRODUODENOSCOPY (EGD) WITH PROPOFOL N/A 12/27/2022   Procedure: ESOPHAGOGASTRODUODENOSCOPY (EGD) WITH PROPOFOL;  Surgeon: Carver, Charles K, DO;  Location: [facility];  Service: Endoscopy;  Laterality: N/A;   FEMORAL REVISION Right 03/30/2019   Procedure: Femoral revision right total hip arthroplasty;    Surgeon: Aluisio, Frank, MD;  Location: [facility];  Service: Orthopedics;  Laterality: Right;  120min   FOREIGN BODY REMOVAL Right 04/02/2014   Procedure: FOREIGN BODY REMOVAL RIGHT FOOT;  Surgeon: Mark A Jenkins, MD;  Location: [facility];  Service: General;  Laterality: Right;   JOINT REPLACEMENT     JOINT REPLACEMENT     LUMBAR DISC SURGERY     REVISION TOTAL HIP ARTHROPLASTY Bilateral 2005-2012   right-left   RIGHT/LEFT HEART CATH AND CORONARY/GRAFT ANGIOGRAPHY N/A 09/27/2022   Procedure: RIGHT/LEFT HEART CATH AND CORONARY/GRAFT ANGIOGRAPHY;  Surgeon: Jordan, Peter M, MD;  [facility];  Service: Cardiovascular;   Laterality: N/A;   SHOULDER OPEN ROTATOR CUFF REPAIR Right    TOTAL HIP ARTHROPLASTY Right 1999   TOTAL HIP ARTHROPLASTY Left 2008   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    Prior to Admission medications   Medication Sig Start Date End Date Taking? Authorizing Provider  atorvastatin (LIPITOR) 80 MG tablet TAKE 1 TABLET BY MOUTH EVERY DAY Patient taking differently: Take 80 mg by mouth daily. 11/30/22  Yes McDowell, Samuel G, MD  Iron, Ferrous Sulfate, 325 (65 Fe) MG TABS Take 325 mg by mouth daily. 12/02/22  Yes Zarwolo, Gloria, FNP  metoprolol succinate (TOPROL-XL) 25 MG 24 hr tablet Take 1 tablet (25 mg total) by mouth daily. 02/11/23  Yes McDowell, Samuel G, MD  pantoprazole (PROTONIX) 40 MG tablet Take 1 tablet (40 mg total) by mouth 2 (two) times daily. 11/22/22 02/20/23 Yes Gonfa, Taye T, MD  potassium chloride (KLOR-CON M10) 10 MEQ tablet Take 3 tablets (30 mEq total) by mouth daily. 02/11/23  Yes McDowell, Samuel G, MD  silver sulfADIAZINE (SILVADENE) 1 % cream Apply 1 Application topically daily. 02/12/23  Yes Zarwolo, Gloria, FNP  torsemide (DEMADEX) 20 MG tablet Take 3 tablets (60 mg total) by mouth daily. 01/29/23 01/24/24 Yes Zarwolo, Gloria, FNP  nitroGLYCERIN (NITROSTAT) 0.4 MG SL tablet Place 1 tablet (0.4 mg total) under the tongue every 5 (five) minutes x 3 doses as needed for chest pain (if no relief after 3rd dose, proceed to the ED for an evaluation or call 911). 09/05/22   McDowell, Samuel G, MD    Current Facility-Administered Medications  Medication Dose Route Frequency Provider Last Rate Last Admin   0.9 %  sodium chloride infusion (Manually program via Guardrails IV Fluids)   Intravenous Once Goldston, Scott, MD       pantoprazole (PROTONIX) injection 40 mg  40 mg Intravenous Q12H Emokpae,  Courage, MD       Current Outpatient Medications  Medication Sig Dispense Refill   atorvastatin (LIPITOR) 80 MG tablet TAKE 1 TABLET BY MOUTH EVERY DAY (Patient taking differently: Take 80 mg by mouth daily.) 90 tablet 1   Iron, Ferrous Sulfate, 325 (65 Fe) MG TABS Take 325 mg by mouth daily. 30 tablet 2   metoprolol succinate (TOPROL-XL) 25 MG 24 hr tablet Take 1 tablet (25 mg total) by mouth daily. 90 tablet 2   pantoprazole (PROTONIX) 40 MG tablet Take 1 tablet (40 mg total) by mouth 2 (two) times daily. 180 tablet 0   potassium chloride (KLOR-CON M10) 10 MEQ tablet Take 3 tablets (30 mEq total) by mouth daily. 270 tablet 2   silver sulfADIAZINE (SILVADENE) 1 % cream Apply 1 Application topically daily. 50   g 0   torsemide (DEMADEX) 20 MG tablet Take 3 tablets (60 mg total) by mouth daily. 270 tablet 3   nitroGLYCERIN (NITROSTAT) 0.4 MG SL tablet Place 1 tablet (0.4 mg total) under the tongue every 5 (five) minutes x 3 doses as needed for chest pain (if no relief after 3rd dose, proceed to the ED for an evaluation or call 911). 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    Employer: RETIRED  Tobacco Use   Smoking status: Former    Packs/day: 0.50    Years: 2.00    Additional pack years: 0.00    Total pack years: 1.00    Types: Cigarettes    Quit date: 09/03/1966    Years since quitting: 56.4   Smokeless tobacco: Never  Vaping Use   Vaping Use: Never used   Substance and Sexual Activity   Alcohol use: Yes    Alcohol/week: 11.0 standard drinks of alcohol    Types: 7 Glasses of wine, 4 Cans of beer per week    Comment: a glass of wine with dinner/ beer on the weekend   Drug use: No   Sexual activity: Not on file  Other Topics Concern   Not on file  Social History Narrative   Lives with his wife.   Social Determinants of Health   Financial Resource Strain: Low Risk  (01/04/2023)   Overall Financial Resource Strain (CARDIA)    Difficulty of Paying Living Expenses: Not hard at all  Food Insecurity: No Food Insecurity (01/04/2023)   Hunger Vital Sign    Worried About Running Out of Food in the Last Year: Never true    Ran Out of Food in the Last Year: Never true  Transportation Needs: No Transportation Needs (01/04/2023)   PRAPARE - Transportation    Lack of Transportation (Medical): No    Lack of Transportation (Non-Medical): No  Physical Activity: Unknown (11/25/2022)   Exercise Vital Sign    Days of Exercise per Week: 0 days    Minutes of Exercise per Session: Not on file  Stress: No Stress Concern Present (01/04/2023)   [employer] - Occupational Stress Questionnaire    Feeling of Stress : Not at all  Social Connections: Moderately Integrated (11/25/2022)   Social Connection and Isolation Panel [NHANES]    Frequency of Communication with Friends and Family: Once a week    Frequency of Social Gatherings with Friends and Family: Once a week    Attends Religious Services: More than 4 times per year    Active Member of Clubs or Organizations: Yes    Attends Club or Organization Meetings: More than 4 times per year    Marital Status: Married  Intimate Partner Violence: Not At Risk (12/25/2022)   Humiliation, Afraid, Rape, and Kick questionnaire    Fear of Current or Ex-Partner: No    Emotionally Abused: No      Physically Abused: No    Sexually Abused: No     Review of Systems   Gen: + fatigue denies any  fever, chills, loss of appetite, change in weight or weight loss CV: Denies chest pain, heart palpitations, syncope, edema  Resp: Denies shortness of breath with rest, cough, wheezing, coughing up blood, and pleurisy. GI: See HPI GU : Denies urinary burning, blood in urine, urinary frequency, and urinary incontinence. Psych: Denies depression, anxiety, memory loss, hallucinations, and confusion. Heme: Denies bruising or bleeding Neuro:  Denies any headaches, dizziness, paresthesias, shaking  Physical Exam   Vital Signs in last 24 hours: Temp:  [97.7 F (36.5 C)] 97.7 F (36.5 C) (06/13 0855) Pulse Rate:  [90-115] 90 (06/13 1422) Resp:  [18-25] 18 (06/13 1422) BP: (102-116)/(54-86) 102/54 (06/13 1415) SpO2:  [91 %-96 %] 96 % (06/13 1422) Weight:  [88.1 kg] 88.1 kg (06/12 2006)    General:   Alert,  Well-developed, well-nourished, pleasant and cooperative in NAD Head:  Normocephalic and atraumatic. Eyes:  Sclera clear, no icterus.   Conjunctiva pink. Ears:  Normal auditory acuity. Mouth:  No deformity or lesions, dentition normal. Neck:  Supple; no masses Lungs:  Clear throughout to auscultation.   No wheezes, crackles, or rhonchi. No acute distress. Heart: Irregularly irregular, rate controlled Abdomen:  Soft, nontender and nondistended. No masses, hepatosplenomegaly or hernias noted. Normal bowel sounds, without guarding, and without rebound.   Rectal: deferred - performed by EDP   Extremities:  Without clubbing or edema. Neurologic:  Alert and  oriented x4. Psych:  Alert and cooperative. Normal mood and affect.  Intake/Output from previous day: No intake/output data recorded. Intake/Output this shift: No intake/output data recorded.  Labs/Studies   Recent Labs Recent Labs    02/12/23 1017 02/13/23 0824 02/14/23 0913  WBC 6.5  --  5.4  HGB 6.7* 7.1* 7.2*  HCT 19.8*  --  22.0*  PLT 221  --  227   BMET Recent Labs    02/14/23 0913  NA 132*  K 3.4*  CL 97*   CO2 26  GLUCOSE 117*  BUN 12  CREATININE 1.14  CALCIUM 8.1*   LFT Recent Labs    02/13/23 0824 02/14/23 0913  PROT  --  6.1*  ALBUMIN  --  3.1*  AST  --  42*  ALT  --  31  ALKPHOS 436* 356*  BILITOT  --  3.1*   PT/INR Recent Labs    02/14/23 0913  LABPROT 15.2  INR 1.2   Hepatitis Panel No results for input(s): "HEPBSAG", "HCVAB", "HEPAIGM", "HEPBIGM" in the last 72 hours. C-Diff No results for input(s): "CDIFFTOX" in the last 72 hours.  Radiology/Studies DG Chest Portable 1 View  Result Date: 02/14/2023 CLINICAL DATA:  Provided history: Fatigue. EXAM: PORTABLE CHEST 1 VIEW COMPARISON:  Chest radiographs 12/28/2022 and earlier. FINDINGS: Prior median sternotomy/CABG. Cardiomegaly. Aortic atherosclerosis. Central pulmonary vascular congestion. Prominence of the intra lung markings likely reflecting interstitial edema. Moderate right pleural effusion with underlying right basilar atelectasis and/or airspace consolidation. Small left pleural effusion. Degenerative changes of the spine. IMPRESSION: 1. Cardiomegaly with central pulmonary vascular congestion and pulmonary interstitial edema. 2. Moderate right pleural effusion with underlying right basilar atelectasis and/or airspace consolidation. 3. Small left pleural effusion. 4.  Aortic Atherosclerosis (ICD10-I70.0). Electronically Signed   By: Kyle  Golden D.O.   On: 02/14/2023 09:43     Assessment   Fotios B Jorgensen is a 81 y.o. year old male with history of A-fib,   previously on Eliquis (currently undergoing evaluation for Watchman device), ischemic cardiomyopathy, HTN, CAD s/p Gelsyn-3, HLD, moderate to severe aortic stenosis, and history of duodenal ulcer in March 2024 which was redemonstrated on EGD in April 2024 who presented to the ED at the recommendation of primary care provider given recent hemoglobin drop to 6.7 on 6/11.  Patient originally presented to the ED yesterday 6/12 but given long wait time he went home and  represented this morning to receive blood transfusion.  Acute on chronic anemia: Presents with acute on chronic anemia with initial hemoglobin of 6.7 on 6/11 with mild improvement to 7.2.  He has not yet received any PRBCs.  Will reassess in the morning and transfuse as needed.  He has had multiple hospitalizations this year with both upper endoscopies revealing persistent duodenal ulcer.  He is continued on PPI twice daily and has received courses of Carafate in the past.  He is no longer on anticoagulation and continues to experience a drop in hemoglobin.  If his EGD is unrevealing we may need to consider colonoscopy and/or capsule study for further evaluation.  Will plan for EGD tomorrow to assess for ongoing peptic ulcer disease, gastritis, or other vascular abnormality such as AVM.  Elevated bilirubin and alk phos: Recent chronically elevated AST and mildly elevated bilirubin.  During his last hospitalization his T. bili also was around 3.7 with indirect 2.4 and direct 1.3.  This admission he presents with AST 42, ALT 31, alk phos 356, T. bili 3.1.  Will plan to fractionate bilirubin tomorrow.  Yesterday his alk phos was elevated to 436.  At his most recent outpatient visit he was advised to complete blood work for autoimmune serologies which are pending, including alkaline phosphatase isoenzymes.  Recent iron panel with elevated ferritin at 49 and normal iron and iron saturation.  Abdominal imaging in January and April noting cholelithiasis without any biliary ductal dilation or cholecystitis.  Will await fractionated alkaline phosphatase but may need to consider imaging such as MRCP for further evaluation of hepatobiliary system if ongoing elevation.  Congestive hepatopathy also not excluded given his cardiomyopathy and aortic stenosis.  Plan / Recommendations   Trend H/H, transfuse for Hgb <7 Follow up fractionated alk phos, AMA, ANA, ASMA Trend HFP PPI IV BID Clear liquids NPO at midnight EGD  tomorrow with Dr. Carver     02/14/2023, 2:54 PM  Laiken Sandy, MSN, FNP-BC, AGACNP-BC [facility]   

## 2023-02-14 NOTE — H&P (View-Only) (Signed)
Gastroenterology Consult   Referring Provider: No ref. provider found Primary Care Physician:  [name], FNP Primary Gastroenterologist: Gerrit Friends.Rourk, MD  Patient ID: Leonard Cisneros; [id number]; 1941/06/08   Admit date: 02/14/2023  LOS: 0 days   Date of Consultation: 02/14/2023  Reason for Consultation: Anemia, heme positive stool  History of Present Illness   Leonard Cisneros is a 82 y.o. year old male with history of A-fib, previously on Eliquis (currently undergoing evaluation for Watchman device), ischemic cardiomyopathy, HTN, CAD s/p Gelsyn-3, HLD, moderate to severe aortic stenosis, and history of duodenal ulcer in March 2024 which was redemonstrated on EGD in April 2024 who presented to the ED at the recommendation of primary care provider given recent hemoglobin drop to 6.7 on 6/11.  Patient originally presented to the ED yesterday 6/12 but given long wait time he went home and represented this morning to receive blood transfusion.  ED Course: Labs -hemoglobin 7.2 in the ED, MCV 113, plts 227, sodium 132, potassium 3.4, albumin 3.1, AST 42, alk phos 356, T. bili 3.1, INR 1.2 Given his hemoglobin improved from 6.7 to 7.2 he has not received any blood.  Consult:  Patient reports he has been feeling fairly well other than some mild fatigue.  Was doing well doing yard work but has been slowly feeling a little bit more tired.  He denies any melena or BRBPR.  Shortness of breath is at baseline.  Denies any chest pain.  States he has a great appetite and has been compliant with his PPI.  He remains off Eliquis or any other blood thinner.  He is also continued on iron for which his stools do not appear to be black, he states his stools are normal brown color and he denies any constipation or diarrhea.  He also denies any nausea, vomiting, dysphagia, or abdominal pain.  No jaundice or pruritus.  His wife does report that he continues to have issues with wound healing to sore on  his right lower extremity and has been awaiting referral to wound center.  She states he is due to have a specialized CT of his chest/heart on Monday for further workup of possible Watchman device.   Patient originally presented to [facility] with shortness of breath, weight gain, edema, and melena and was transferred to [facility] for GI evaluation in March 2024.  His hemoglobin at that time was 7.8.  He was started on Lasix for CHF exacerbation.  Anemia panel was unremarkable.  He was given 1 unit of PRBCs while inpatient and started on PPI.  He underwent EGD as noted below foe evaluation of heme positive stool and melena.    Recent admission at [facility] for chest pain and found to have significant symptomatic anemia with hemoglobin of 5 and heme positive stool.  Underwent EGD as outlined below.  Also treated with thoracentesis for improvement of shortness of breath while inpatient -removal 1.5 L.  Also had right upper quadrant ultrasound as outlined below.  Elevated liver enzymes while inpatient suspected to be secondary to congestive hepatopathy.  Advised to continue to follow liver enzymes and keep follow-up outpatient with cardiology.  Advise need for CBC 1 week after discharge.  Hemoglobin 8.3 on discharge.  AST 51, ALT 49, alk phos 243, T. bili 3.7 with indirect 2.4 and direct 1.3 on discharge.   Prior endoscopic evaluation: Colonoscopy in January 2019: -entire colon normal -no repeat colonoscopy due to age  EGD 11/21/2022 at [facility]: -Z-line regular -Normal  esophagus -Gastritis s/p biopsy -Nonbleeding duodenal ulcer with clean ulcer base -Mucosal nodule in the duodenum s/p biopsy -Duodenal nodule with polypoid fragment of benign small bowel mucosa with focal foveolar metaplasia suggestive of peptic injury -Gastric biopsies negative for H. pylori or intestinal metaplasia. -Advised to avoid NSAIDs, resume Eliquis in 3 days and use PPI twice daily for 3 months.  EGD 12/27/22: -Mild Schatzki's  ring -Gastritis -Nonbleeding duodenal ulcer with clean ulcer base -Advised IV PPI while inpatient and DC on p.o. PPI twice daily.  May resume Eliquis   Most recent imaging: RUQ [place] 12/25/2022: -Cholelithiasis without additional changes to suggest acute cholecystitis -Right pleural effusion -Patent portal vein  Abdominal US 09/24/22 IMPRESSION: -Cholelithiasis without evidence of acute cholecystitis or biliary dilatation. -Probable fatty infiltration of liver as above. -RIGHT pleural effusion.    Past Medical History:  Diagnosis Date   Aortic stenosis    Atrial flutter (HCC)    a. s/p remote ablation by Dr. Ladona Ridgel.   CAD (coronary artery disease)    a. s/p CABG 2003. b. 01/2016: unstable angina - s/p BMS to SVG-ramus intermediate, patent LIMA-mLAD, patent RIMA-dRCA.    Chronic lower back pain    Essential hypertension    Hyperlipidemia    Ischemic cardiomyopathy    a. Cath 01/2016 -  EF 50-55% with mild distal inferior hypocontractility.   Peripheral neuropathy 04/04/2014   Permanent atrial fibrillation [facility])     Past Surgical History:  Procedure Laterality Date   BACK SURGERY     BIOPSY  11/21/2022   Procedure: BIOPSY;  Surgeon: Napoleon Form, MD;  Location: [facility] ENDOSCOPY;  Service: Gastroenterology;;   CARDIAC CATHETERIZATION  ~ 2000; 2003   CARDIAC CATHETERIZATION N/A 02/01/2016   Procedure: Left Heart Cath and Cors/Grafts Angiography;  Surgeon: Lennette Bihari, MD;  Location: [facility] INVASIVE CV LAB;  Service: Cardiovascular;  Laterality: N/A;   CARDIAC CATHETERIZATION N/A 02/01/2016   Procedure: Coronary Stent Intervention;  Surgeon: Lennette Bihari, MD;  Location: [facility] INVASIVE CV LAB;  Service: Cardiovascular;  Laterality: N/A;   CATARACT EXTRACTION W/PHACO Left 02/09/2019   Procedure: CATARACT EXTRACTION PHACO AND INTRAOCULAR LENS PLACEMENT (IOC);  Surgeon: Fabio Pierce, MD;  Location: [facility] ORS;  Service: Ophthalmology;  Laterality: Left;  CDE: 27.70   CATARACT EXTRACTION  W/PHACO Right 02/23/2019   Procedure: CATARACT EXTRACTION PHACO AND INTRAOCULAR LENS PLACEMENT RIGHT EYE;  Surgeon: Fabio Pierce, MD;  Location: [facility] ORS;  Service: Ophthalmology;  Laterality: Right;  CDE: 10.15   COLONOSCOPY  06/04/2002   Normal colon and rectum   COLONOSCOPY  07/08/2012   Procedure: COLONOSCOPY;  Surgeon: Corbin Ade, MD;  Location: [facility] ENDO SUITE;  Service: Endoscopy;  Laterality: N/A;  11:30   COLONOSCOPY N/A 10/02/2017   Procedure: COLONOSCOPY;  Surgeon: Corbin Ade, MD;  Location: [facility] ENDO SUITE;  Service: Endoscopy;  Laterality: N/A;  10:30am   CORONARY ANGIOPLASTY WITH STENT PLACEMENT  02/01/2016   CORONARY ARTERY BYPASS GRAFT  2003   LIMA to LAD,LIMA to distal RCA,SVG to ramus intermediate vessel.   ESOPHAGOGASTRODUODENOSCOPY (EGD) WITH PROPOFOL N/A 11/21/2022   Procedure: ESOPHAGOGASTRODUODENOSCOPY (EGD) WITH PROPOFOL;  Surgeon: Napoleon Form, MD;  Location: [facility] ENDOS[facility];  Service: Gastroenterology;  Laterality: N/A;   ESOPHAGOGASTRODUODENOSCOPY (EGD) WITH PROPOFOL N/A 12/27/2022   Procedure: ESOPHAGOGASTRODUODENOSCOPY (EGD) WITH PROPOFOL;  Surgeon: Lanelle Bal, DO;  Location: [facility] ENDO [facility]ITE;  Service: Endoscopy;  Laterality: N/A;   FEMORAL REVISION Right 03/30/2019   Procedure: Femoral revision right total hip arthroplasty;  Surgeon: Ollen Gross, MD;  Location: [facility] ORS;  Service: Orthopedics;  Laterality: Right;    FOREIGN BODY REMOVAL Right 04/02/2014   Procedure: FOREIGN BODY REMOVAL RIGHT FOOT;  Surgeon: Dalia Heading, MD;  Location: [facility] ORS;  Service: General;  Laterality: Right;   JOINT REPLACEMENT     JOINT REPLACEMENT     LUMBAR DISC SURGERY     REVISION TOTAL HIP ARTHROPLASTY Bilateral 2005-2012   right-left   RIGHT/LEFT HEART CATH AND CORONARY/GRAFT ANGIOGRAPHY N/A 09/27/2022   Procedure: RIGHT/LEFT HEART CATH AND CORONARY/GRAFT ANGIOGRAPHY;  Surgeon: [place], Peter M, MD;  Location: [facility] INVAS[facility] CV LAB;  Service: Cardiovascular;   Laterality: N/A;   SHOULDER OPEN ROTATOR CUFF REPAIR Right    TOTAL HIP ARTHROPLASTY Right 1999   TOTAL HIP ARTHROPLASTY Left 2008   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    Prior to Admission medications   Medication Sig Start Date End Date Taking? Authorizing Provider  atorvastatin (LIPITOR) 80 MG tablet TAKE 1 TABLET BY MOUTH EVERY DAY Patient taking differently: Take 80 mg by mouth daily. 11/30/22  Yes [name], MD  Iron, Ferrous Sulfate, 325 (65 Fe) MG TABS Take 325 mg by mouth daily. 12/02/22  Yes [name], FNP  metoprolol succinate (TOPROL-XL) 25 MG 24 hr tablet Take 1 tablet (25 mg total) by mouth daily. 02/11/23  Yes [name], MD  pantoprazole (PROTONIX) 40 MG tablet Take 1 tablet (40 mg total) by mouth 2 (two) times daily. 11/22/22 02/20/23 Yes Almon Hercules, MD  potassium chloride (KLOR-CON M10) 10 MEQ tablet Take 3 tablets (30 mEq total) by mouth daily. 02/11/23  Yes [name], MD  silver sulfADIAZINE (SILVADENE) 1 % cream Apply 1 Application topically daily. 02/12/23  Yes [name], FNP  torsemide (DEMADEX) 20 MG tablet Take 3 tablets (60 mg total) by mouth daily. 01/29/23 01/24/24 Yes [name], FNP  nitroGLYCERIN (NITROSTAT) 0.4 MG SL tablet Place 1 tablet (0.4 mg total) under the tongue every 5 (five) minutes x 3 doses as needed for chest pain (if no relief after 3rd dose, proceed to the ED for an evaluation or call 911). 09/05/22   [name], MD    Current Facility-Administered Medications  Medication Dose Route Frequency Provider Last Rate Last Admin   0.9 %  sodium chloride infusion (Manually program via Guardrails IV Fluids)   Intravenous Once Pricilla Loveless, MD       pantoprazole (PROTONIX) injection 40 mg  40 mg Intravenous Q12H [name], MD       Current Outpatient Medications  Medication Sig Dispense Refill   atorvastatin (LIPITOR) 80 MG tablet TAKE 1 TABLET BY MOUTH EVERY DAY (Patient taking differently: Take 80 mg by mouth daily.) 90 tablet 1   Iron, Ferrous Sulfate, 325 (65 Fe) MG TABS Take 325 mg by mouth daily. 30 tablet 2   metoprolol succinate (TOPROL-XL) 25 MG 24 hr tablet Take 1 tablet (25 mg total) by mouth daily. 90 tablet 2   pantoprazole (PROTONIX) 40 MG tablet Take 1 tablet (40 mg total) by mouth 2 (two) times daily. 180 tablet 0   potassium chloride (KLOR-CON M10) 10 MEQ tablet Take 3 tablets (30 mEq total) by mouth daily. 270 tablet 2   silver sulfADIAZINE (SILVADENE) 1 % cream Apply 1 Application topically daily. 50  g 0   torsemide (DEMADEX) 20 MG tablet Take 3 tablets (60 mg total) by mouth daily. 270 tablet 3   nitroGLYCERIN (NITROSTAT) 0.4 MG SL tablet Place 1 tablet (0.4 mg total) under the tongue every 5 (five) minutes x 3 doses as needed for chest pain (if no relief after 3rd dose, proceed to the ED for an evaluation or call 911). 25 tablet 3    Allergies as of 02/14/2023 - Review Complete 02/14/2023  Allergen Reaction Noted   Augmentin [amoxicillin-pot clavulanate] Nausea And Vomiting and Other (See Comments) 01/14/2013    Family History  Problem Relation Age of Onset   Heart attack Brother    Colon cancer Neg Hx     Social History   Socioeconomic History   Marital status: Married    Spouse name: Larita Fife   Number of children: 2   Years of education: Not on file   Highest education level: Bachelor's degree (e.g., BA, AB, BS)  Occupational History   Occupation: PT Holiday representative: RETIRED  Tobacco Use   Smoking status: Former    Packs/day: 0.50    Years: 2.00    Additional pack years: 0.00    Total pack years: 1.00    Types: Cigarettes    Quit date: 09/03/1966    Years since quitting: 56.4   Smokeless tobacco: Never  Vaping Use   Vaping Use: Never used   Substance and Sexual Activity   Alcohol use: Yes    Alcohol/week: 11.0 standard drinks of alcohol    Types: 7 Glasses of wine, 4 Cans of beer per week    Comment: a glass of wine with dinner/ beer on the weekend   Drug use: No   Sexual activity: Not on file  Other Topics Concern   Not on file  Social History Narrative   Lives with his wife.   Social Determinants of Health   Financial Resource Strain: Low Risk  (01/04/2023)   Overall Financial Resource Strain (CARDIA)    Difficulty of Paying Living Expenses: Not hard at all  Food Insecurity: No Food Insecurity (01/04/2023)   Hunger Vital Sign    Worried About Running Out of Food in the Last Year: Never true    Ran Out of Food in the Last Year: Never true  Transportation Needs: No Transportation Needs (01/04/2023)   PRAPARE - Administrator, Civil Service (Medical): No    Lack of Transportation (Non-Medical): No  Physical Activity: Unknown (11/25/2022)   Exercise Vital Sign    Days of Exercise per Week: 0 days    Minutes of Exercise per Session: Not on file  Stress: No Stress Concern Present (01/04/2023)   [employer] of Occupational Health - Occupational Stress Questionnaire    Feeling of Stress : Not at all  Social Connections: Moderately Integrated (11/25/2022)   Social Connection and Isolation Panel [NHANES]    Frequency of Communication with Friends and Family: Once a week    Frequency of Social Gatherings with Friends and Family: Once a week    Attends Religious Services: More than 4 times per year    Active Member of [employer] or Organizations: Yes    Attends Banker Meetings: More than 4 times per year    Marital Status: Married  Catering manager Violence: Not At Risk (12/25/2022)   Humiliation, Afraid, Rape, and Kick questionnaire    Fear of Current or Ex-Partner: No    Emotionally Abused: No  Physically Abused: No    Sexually Abused: No     Review of Systems   Gen: + fatigue denies any  fever, chills, loss of appetite, change in weight or weight loss CV: Denies chest pain, heart palpitations, syncope, edema  Resp: Denies shortness of breath with rest, cough, wheezing, coughing up blood, and pleurisy. GI: See HPI GU : Denies urinary burning, blood in urine, urinary frequency, and urinary incontinence. Psych: Denies depression, anxiety, memory loss, hallucinations, and confusion. Heme: Denies bruising or bleeding Neuro:  Denies any headaches, dizziness, paresthesias, shaking  Physical Exam   Vital Signs in last 24 hours: Temp:  [97.7 F (36.5 C)] 97.7 F (36.5 C) (06/13 0855) Pulse Rate:  [90-115] 90 (06/13 1422) Resp:  [18-25] 18 (06/13 1422) BP: (102-116)/(54-86) 102/54 (06/13 1415) SpO2:  [91 %-96 %] 96 % (06/13 1422) Weight:  [88.1 kg] 88.1 kg (06/12 2006)    General:   Alert,  Well-developed, well-nourished, pleasant and cooperative in NAD Head:  Normocephalic and atraumatic. Eyes:  Sclera clear, no icterus.   Conjunctiva pink. Ears:  Normal auditory acuity. Mouth:  No deformity or lesions, dentition normal. Neck:  Supple; no masses Lungs:  Clear throughout to auscultation.   No wheezes, crackles, or rhonchi. No acute distress. Heart: Irregularly irregular, rate controlled Abdomen:  Soft, nontender and nondistended. No masses, hepatosplenomegaly or hernias noted. Normal bowel sounds, without guarding, and without rebound.   Rectal: deferred - performed by EDP   Extremities:  Without clubbing or edema. Neurologic:  Alert and  oriented x4. Psych:  Alert and cooperative. Normal mood and affect.  Intake/Output from previous day: No intake/output data recorded. Intake/Output this shift: No intake/output data recorded.  Labs/Studies   Recent Labs Recent Labs    02/12/23 1017 02/13/23 0824 02/14/23 0913  WBC 6.5  --  5.4  HGB 6.7* 7.1* 7.2*  HCT 19.8*  --  22.0*  PLT 221  --  227   BMET Recent Labs    02/14/23 0913  NA 132*  K 3.4*  CL 97*   CO2 26  GLUCOSE 117*  BUN 12  CREATININE 1.14  CALCIUM 8.1*   LFT Recent Labs    02/13/23 0824 02/14/23 0913  PROT  --  6.1*  ALBUMIN  --  3.1*  AST  --  42*  ALT  --  31  ALKPHOS 436* 356*  BILITOT  --  3.1*   PT/INR Recent Labs    02/14/23 0913  LABPROT 15.2  INR 1.2   Hepatitis Panel No results for input(s): "HEPBSAG", "HCVAB", "HEPAIGM", "HEPBIGM" in the last 72 hours. C-Diff No results for input(s): "CDIFFTOX" in the last 72 hours.  Radiology/Studies DG Chest Portable 1 View  Result Date: 02/14/2023 CLINICAL DATA:  Provided history: Fatigue. EXAM: PORTABLE CHEST 1 VIEW COMPARISON:  Chest radiographs 12/28/2022 and earlier. FINDINGS: Prior median sternotomy/CABG. Cardiomegaly. Aortic atherosclerosis. Central pulmonary vascular congestion. Prominence of the intra lung markings likely reflecting interstitial edema. Moderate right pleural effusion with underlying right basilar atelectasis and/or airspace consolidation. Small left pleural effusion. Degenerative changes of the spine. IMPRESSION: 1. Cardiomegaly with central pulmonary vascular congestion and pulmonary interstitial edema. 2. Moderate right pleural effusion with underlying right basilar atelectasis and/or airspace consolidation. 3. Small left pleural effusion. 4.  Aortic Atherosclerosis (ICD10-I70.0). Electronically Signed   By: Jackey Loge D.O.   On: 02/14/2023 09:43     Assessment   Leonard Cisneros is a 82 y.o. year old male with history of A-fib,  previously on Eliquis (currently undergoing evaluation for Watchman device), ischemic cardiomyopathy, HTN, CAD s/p Gelsyn-3, HLD, moderate to severe aortic stenosis, and history of duodenal ulcer in March 2024 which was redemonstrated on EGD in April 2024 who presented to the ED at the recommendation of primary care provider given recent hemoglobin drop to 6.7 on 6/11.  Patient originally presented to the ED yesterday 6/12 but given long wait time he went home and  represented this morning to receive blood transfusion.  Acute on chronic anemia: Presents with acute on chronic anemia with initial hemoglobin of 6.7 on 6/11 with mild improvement to 7.2.  He has not yet received any PRBCs.  Will reassess in the morning and transfuse as needed.  He has had multiple hospitalizations this year with both upper endoscopies revealing persistent duodenal ulcer.  He is continued on PPI twice daily and has received courses of Carafate in the past.  He is no longer on anticoagulation and continues to experience a drop in hemoglobin.  If his EGD is unrevealing we may need to consider colonoscopy and/or capsule study for further evaluation.  Will plan for EGD tomorrow to assess for ongoing peptic ulcer disease, gastritis, or other vascular abnormality such as AVM.  Elevated bilirubin and alk phos: Recent chronically elevated AST and mildly elevated bilirubin.  During his last hospitalization his T. bili also was around 3.7 with indirect 2.4 and direct 1.3.  This admission he presents with AST 42, ALT 31, alk phos 356, T. bili 3.1.  Will plan to fractionate bilirubin tomorrow.  Yesterday his alk phos was elevated to 436.  At his most recent outpatient visit he was advised to complete blood work for autoimmune serologies which are pending, including alkaline phosphatase isoenzymes.  Recent iron panel with elevated ferritin at 49 and normal iron and iron saturation.  Abdominal imaging in January and April noting cholelithiasis without any biliary ductal dilation or cholecystitis.  Will await fractionated alkaline phosphatase but may need to consider imaging such as MRCP for further evaluation of hepatobiliary system if ongoing elevation.  Congestive hepatopathy also not excluded given his cardiomyopathy and aortic stenosis.  Plan / Recommendations   Trend H/H, transfuse for Hgb <7 Follow up fractionated alk phos, AMA, ANA, ASMA Trend HFP PPI IV BID Clear liquids NPO at midnight EGD  tomorrow with Dr. Marletta Lor     02/14/2023, 2:54 PM  Brooke Bonito, MSN, FNP-BC, AGACNP-BC [facility]

## 2023-02-14 NOTE — ED Notes (Signed)
ED TO INPATIENT HANDOFF REPORT  ED Nurse Name and Phone #: Wandra Mannan, Paramedic [phone]  S Name/Age/Gender Leonard Cisneros 82 y.o. male Room/Bed: APA09/APA09  Code Status   Code Status: Prior  Home/SNF/Other Home Patient oriented to: self, place, time, and situation Is this baseline? Yes   Triage Complete: Triage complete  Chief Complaint Acute exacerbation of CHF (congestive heart failure) (HCC) [I50.9]  Triage Note Pt here yesterday for blood transfusion due to hgb 6.7. pt came to ED last night but wanted to wait for infusion clinic due to having to wait so long in ED. Back today for same. Pt sob/alert and oriented. Ble swelling/redness noted with RLE bandaged to anterior using crutches. Color slightly jaundiced    Allergies Allergies  Allergen Reactions   Augmentin [Amoxicillin-Pot Clavulanate] Nausea And Vomiting and Other (See Comments)    Has patient had a PCN reaction causing immediate rash, facial/tongue/throat swelling, SOB or lightheadedness with hypotension: Unknown Has patient had a PCN reaction causing severe rash involving mucus membranes or skin necrosis: No Has patient had a PCN reaction that required hospitalization: No Has patient had a PCN reaction occurring within the last 10 years: No If all of the above answers are "NO", then may proceed with Cephalosporin use.     Level of Care/Admitting Diagnosis ED Disposition     ED Disposition  Admit   Condition  --   Comment  Hospital Area: [facility] [[id number]]  Level of Care: Telemetry [5]  Covid Evaluation: Asymptomatic - no recent exposure (last 10 days) testing not required  Diagnosis: Acute exacerbation of CHF (congestive heart failure) [facility]) [[id number]]  Admitting Physician: Marylyn Ishihara  Attending Physician: Marylyn Ishihara          B Medical/Surgery History Past Medical History:  Diagnosis Date   Aortic stenosis    Atrial flutter (HCC)    a. s/p  remote ablation by Dr. Ladona Ridgel.   CAD (coronary artery disease)    a. s/p CABG 2003. b. 01/2016: unstable angina - s/p BMS to SVG-ramus intermediate, patent LIMA-mLAD, patent RIMA-dRCA.    Chronic lower back pain    Essential hypertension    Hyperlipidemia    Ischemic cardiomyopathy    a. Cath 01/2016 -  EF 50-55% with mild distal inferior hypocontractility.   Peripheral neuropathy 04/04/2014   Permanent atrial fibrillation [facility])    Past Surgical History:  Procedure Laterality Date   BACK SURGERY     BIOPSY  11/21/2022   Procedure: BIOPSY;  Surgeon: Napoleon Form, MD;  Location: [facility] ENDOSCOPY;  Service: Gastroenterology;;   CARDIAC CATHETERIZATION  ~ 2000; 2003   CARDIAC CATHETERIZATION N/A 02/01/2016   Procedure: Left Heart Cath and Cors/Grafts Angiography;  Surgeon: Lennette Bihari, MD;  Location: [facility] INVASIVE CV LAB;  Service: Cardiovascular;  Laterality: N/A;   CARDIAC CATHETERIZATION N/A 02/01/2016   Procedure: Coronary Stent Intervention;  Surgeon: Lennette Bihari, MD;  Location: [facility] INVASIVE CV LAB;  Service: Cardiovascular;  Laterality: N/A;   CATARACT EXTRACTION W/PHACO Left 02/09/2019   Procedure: CATARACT EXTRACTION PHACO AND INTRAOCULAR LENS PLACEMENT (IOC);  Surgeon: Fabio Pierce, MD;  Location: [facility] ORS;  Service: Ophthalmology;  Laterality: Left;  CDE: 27.70   CATARACT EXTRACTION W/PHACO Right 02/23/2019   Procedure: CATARACT EXTRACTION PHACO AND INTRAOCULAR LENS PLACEMENT RIGHT EYE;  Surgeon: Fabio Pierce, MD;  Location: [facility] ORS;  Service: Ophthalmology;  Laterality: Right;  CDE: 10.15   COLONOSCOPY  06/04/2002   Normal colon and rectum  COLONOSCOPY  07/08/2012   Procedure: COLONOSCOPY;  Surgeon: Corbin Ade, MD;  Location: [facility] ENDO SUITE;  Service: Endoscopy;  Laterality: N/A;  11:30   COLONOSCOPY N/A 10/02/2017   Procedure: COLONOSCOPY;  Surgeon: Corbin Ade, MD;  Location: [facility] ENDO SU[facility];  Service: Endoscopy;  Laterality: N/A;  10:30am   CORONARY ANGIOPLASTY WITH STENT  PLACEMENT  02/01/2016   CORONARY ARTERY BYPASS GRAFT  2003   LIMA to LAD,LIMA to distal RCA,SVG to ramus intermediate vessel.   ESOPHAGOGASTRODUODENOSCOPY (EGD) WITH PROPOFOL N/A 11/21/2022   Procedure: ESOPHAGOGASTRODUODENOSCOPY (EGD) WITH PROPOFOL;  Surgeon: Napoleon Form, MD;  Location: [facility];  Service: Gastroenterology;  Laterality: N/A;   ESOPHAGOGASTRODUODENOSCOPY (EGD) WITH PROPOFOL N/A 12/27/2022   Procedure: ESOPHAGOGASTRODUODENOSCOPY (EGD) WITH PROPOFOL;  Surgeon: Lanelle Bal, DO;  Location: [facility];  Service: Endoscopy;  Laterality: N/A;   FEMORAL REVISION Right 03/30/2019   Procedure: Femoral revision right total hip arthroplasty;  Surgeon: Ollen Gross, MD;  Location: [facility] ORS;  Service: Orthopedics;  Laterality: Right;    FOREIGN BODY REMOVAL Right 04/02/2014   Procedure: FOREIGN BODY REMOVAL RIGHT FOOT;  Surgeon: Dalia Heading, MD;  Location: [facility] ORS;  Service: General;  Laterality: Right;   JOINT REPLACEMENT     JOINT REPLACEMENT     LUMBAR DISC SURGERY     REVISION TOTAL HIP ARTHROPLASTY Bilateral 2005-2012   right-left   RIGHT/LEFT HEART CATH AND CORONARY/GRAFT ANGIOGRAPHY N/A 09/27/2022   Procedure: RIGHT/LEFT HEART CATH AND CORONARY/GRAFT ANGIOGRAPHY;  Surgeon: Swazil[place], Peter M, MD;  Location: [facility] CV LAB;  Service: Cardiovascular;  Laterality: N/A;   SHOULDER OPEN ROTATOR CUFF REPAIR Right    TOTAL HIP ARTHROPLASTY Right 1999   TOTAL HIP ARTHROPLASTY Left 2008   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     A IV Location/Drains/Wounds Patient Lines/Drains/Airways Status     Active Line/Drains/Airways     Name Placement date Placement time Site Days   Peripheral IV 02/14/23 18 G 1.16" Anterior;Right;Upper Arm 02/14/23  0910  Arm  less than 1   Wound  / Incision (Open or Dehisced) 11/19/22 Non-pressure wound Pretibial Left;Proximal;Medial 11/19/22  0800  Pretibial  87            Intake/Output Last 24 hours No intake or output data in the 24 hours ending 02/14/23 1247  Labs/Imaging Results for orders placed or performed during the hospital encounter of 02/14/23 (from the past 48 hour(s))  Comprehensive metabolic panel     Status: Abnormal   Collection Time: 02/14/23  9:13 AM  Result Value Ref Range   Sodium 132 (L) 135 - 145 mmol/L   Potassium 3.4 (L) 3.5 - 5.1 mmol/L   Chloride 97 (L) 98 - 111 mmol/L   CO2 26 22 - 32 mmol/L   Glucose, Bld 117 (H) 70 - 99 mg/dL    Comment: Glucose reference range applies only to samples taken after fasting for at least 8 hours.   BUN 12 8 - 23 mg/dL   Creatinine, Ser 1.61 0.61 - 1.24 mg/dL   Calcium 8.1 (L) 8.9 - 10.3 mg/dL   Total Protein 6.1 (L) 6.5 - 8.1 g/dL   Albumin 3.1 (L) 3.5 - 5.0 g/dL   AST 42 (H) 15 - 41 U/L   ALT 31 0 - 44 U/L   Alkaline  Phosphatase 356 (H) 38 - 126 U/L   Total Bilirubin 3.1 (H) 0.3 - 1.2 mg/dL   GFR, Estimated >16 >10 mL/min    Comment: (NOTE) Calculated using the CKD-EPI Creatinine Equation (2021)    Anion gap 9 5 - 15    Comment: Performed at [facility], [address] [place], [place] [address]  Protime-INR     Status: None   Collection Time: 02/14/23  9:13 AM  Result Value Ref Range   Prothrombin Time 15.2 11.4 - 15.2 seconds   INR 1.2 0.8 - 1.2    Comment: (NOTE) INR goal varies based on device and disease states. Performed at [facility], [address], [place], [place] [address]   Type and screen [facility]     Status: None   Collection Time: 02/14/23  9:13 AM  Result Value Ref Range   ABO/RH(D) O POS    Antibody Screen POS    Sample Expiration      02/17/2023,2359 Performed at [facility], [address], [place], [place] [address]   CBC with Differential     Status: Abnormal   Collection Time: 02/14/23  9:13 AM  Result Value  Ref Range   WBC 5.4 4.0 - 10.5 K/uL   RBC 1.94 (L) 4.22 - 5.81 MIL/uL   Hemoglobin 7.2 (L) 13.0 - 17.0 g/dL   HCT 82.9 (L) 56.2 - 13.0 %   MCV 113.4 (H) 80.0 - 100.0 fL   MCH 37.1 (H) 26.0 - 34.0 pg   MCHC 32.7 30.0 - 36.0 g/dL   RDW 86.5 (H) 78.4 - 69.6 %   Platelets 227 150 - 400 K/uL   nRBC 0.0 0.0 - 0.2 %   Neutrophils Relative % 70 %   Neutro Abs 3.7 1.7 - 7.7 K/uL   Lymphocytes Relative 14 %   Lymphs Abs 0.8 0.7 - 4.0 K/uL   Monocytes Relative 11 %   Monocytes Absolute 0.6 0.1 - 1.0 K/uL   Eosinophils Relative 3 %   Eosinophils Absolute 0.2 0.0 - 0.5 K/uL   Basophils Relative 1 %   Basophils Absolute 0.0 0.0 - 0.1 K/uL   WBC Morphology MORPHOLOGY UNREMARKABLE    RBC Morphology See Note     Comment: ANISOCYTOSIS AND MACROCYTOSIS   Smear Review MORPHOLOGY UNREMARKABLE    Immature Granulocytes 1 %   Abs Immature Granulocytes 0.07 0.00 - 0.07 K/uL   Acanthocytes PRESENT    Ovalocytes PRESENT     Comment: Performed at [facility], [address], [place], [place] [address]  Brain natriuretic peptide     Status: Abnormal   Collection Time: 02/14/23  9:13 AM  Result Value Ref Range   B Natriuretic Peptide 641.0 (H) 0.0 - 100.0 pg/mL    Comment: Performed at [facility], [address], [place], [place] [address]  Prepare RBC (crossmatch)     Status: None   Collection Time: 02/14/23  9:14 AM  Result Value Ref Range   Order Confirmation      ORDER PROCESSED BY BLOOD BANK Performed at [facility], [address] [place], [place] [address]    DG Chest Portable 1 View  Result Date: 02/14/2023 CLINICAL DATA:  Provided history: Fatigue. EXAM: PORTABLE CHEST 1 VIEW COMPARISON:  Chest radiographs 12/28/2022 and earlier. FINDINGS: Prior median sternotomy/CABG. Cardiomegaly. Aortic atherosclerosis. Central pulmonary vascular congestion. Prominence of the intra lung markings likely reflecting interstitial edema. Moderate right pleural effusion with underlying right basilar  atelectasis and/or airspace consolidation. Small  left pleural effusion. Degenerative changes of the spine. IMPRESSION: 1. Cardiomegaly with central pulmonary vascular congestion and pulmonary interstitial edema. 2. Moderate right pleural effusion with underlying right basilar atelectasis and/or airspace consolidation. 3. Small left pleural effusion. 4.  Aortic Atherosclerosis (ICD10-I70.0). Electronically Signed   By: Jackey Loge D.O.   On: 02/14/2023 09:43    Pending Labs Unresulted Labs (From admission, onward)    None       Vitals/Pain Today's Vitals   02/14/23 0854 02/14/23 0855 02/14/23 1000  BP:  116/86 108/64  Pulse:  (!) 115 100  Resp:  (!) 23 20  Temp:  97.7 F (36.5 C)   TempSrc:  Oral   SpO2:  95% 91%  PainSc: 0-No pain      Isolation Precautions No active isolations  Medications Medications  0.9 %  sodium chloride infusion (Manually program via Guardrails IV Fluids) (has no administration in time range)  pantoprazole (PROTONIX) injection 40 mg (has no administration in time range)  pantoprazole (PROTONIX) 80 mg /NS 100 mL IVPB (0 mg Intravenous Stopped 02/14/23 1016)  potassium chloride SA (KLOR-CON M) CR tablet 40 mEq (40 mEq Oral Given 02/14/23 1108)  furosemide (LASIX) injection 40 mg (40 mg Intravenous Given 02/14/23 1109)    Mobility walks     Focused Assessments Cardiac Assessment Handoff:    Lab Results  Component Value Date   CKTOTAL 89 04/23/2018   TROPONINI <0.03 10/23/2018   Lab Results  Component Value Date   DDIMER 0.78 (H) 11/10/2020   Does the Patient currently have chest pain? No    R Recommendations: See Admitting Provider Note  Report given to:   Additional Notes: N/A

## 2023-02-14 NOTE — ED Provider Notes (Signed)
[place] [facility] DE[facility] AT [facility] Provider Note   CSN: [id number] Arrival date & time: 02/14/23  0840     History  Chief Complaint  Patient presents with   abnormal labs    Leonard Cisneros is a 82 y.o. male.  HPI 82 year old male with multiple comorbidities including cardiomyopathy, CAD, atrial fibrillation and recent GI bleeding presents with anemia.  Sent in by his PCP due to a hemoglobin of 6.7 on lab work on 6/11.  He was taken off of his Eliquis.  He does note that he has been having less energy to do tasks such as working outside.  Has to stop earlier than normal over the last 2 weeks.  He denies chest pain, shortness of breath.  He states he had some black stools for a few weeks a few weeks ago but recently it seems to be brown.  Doctor told him to come in for a blood transfusion.  Home Medications Prior to Admission medications   Medication Sig Start Date End Date Taking? Authorizing Provider  atorvastatin (LIPITOR) 80 MG tablet TAKE 1 TABLET BY MOUTH EVERY DAY Patient taking differently: Take 80 mg by mouth daily. 11/30/22  Yes [name], MD  Iron, Ferrous Sulfate, 325 (65 Fe) MG TABS Take 325 mg by mouth daily. 12/02/22  Yes [name], FNP  metoprolol succinate (TOPROL-XL) 25 MG 24 hr tablet Take 1 tablet (25 mg total) by mouth daily. 02/11/23  Yes [name], MD  pantoprazole (PROTONIX) 40 MG tablet Take 1 tablet (40 mg total) by mouth 2 (two) times daily. 11/22/22 02/20/23 Yes Almon Hercules, MD  potassium chloride (KLOR-CON M10) 10 MEQ tablet Take 3 tablets (30 mEq total) by mouth daily. 02/11/23  Yes [name], MD  silver sulfADIAZINE (SILVADENE) 1 % cream Apply 1 Application topically daily. 02/12/23  Yes [name], FNP  torsemide (DEMADEX) 20 MG tablet Take 3 tablets (60 mg total) by mouth daily. 01/29/23 01/24/24 Yes [name], FNP  nitroGLYCERIN (NITROSTAT) 0.4 MG SL tablet Place 1 tablet (0.4 mg total) under  the tongue every 5 (five) minutes x 3 doses as needed for chest pain (if no relief after 3rd dose, proceed to the ED for an evaluation or call 911). 09/05/22   [name], MD      Allergies    Augmentin [amoxicillin-pot clavulanate]    Review of Systems   Review of Systems  Constitutional:  Positive for fatigue.  Respiratory:  Negative for cough and shortness of breath.   Cardiovascular:  Positive for leg swelling (chronic, unchanged). Negative for chest pain.  Gastrointestinal:  Negative for abdominal pain and blood in stool.  Neurological:  Negative for light-headedness.    Physical Exam Updated Vital Signs BP (!) 97/55 (BP Location: Left Arm)   Pulse 100   Temp 98 F (36.7 C) (Oral)   Resp 18   Ht 5\' 11"  (1.803 m)   Wt 87.6 kg   SpO2 95%   BMI 26.94 kg/m  Physical Exam Vitals and nursing note reviewed. Exam conducted with a chaperone present.  Constitutional:      Appearance: He is well-developed.  HENT:     Head: Normocephalic and atraumatic.  Cardiovascular:     Rate and Rhythm: Tachycardia present. Rhythm irregular.     Heart sounds: Normal heart sounds.  Pulmonary:     Effort: Pulmonary effort is normal. Tachypnea present. No accessory muscle usage or respiratory distress.  Breath sounds: Examination of the right-lower field reveals decreased breath sounds. Examination of the left-lower field reveals decreased breath sounds. Decreased breath sounds present.  Abdominal:     Palpations: Abdomen is soft.     Tenderness: There is no abdominal tenderness.  Genitourinary:    Rectum: Guaiac result positive. No mass, tenderness or external hemorrhoid.  Skin:    General: Skin is warm and dry.  Neurological:     Mental Status: He is alert.     ED Results / Procedures / Treatments   Labs (all labs ordered are listed, but only abnormal results are displayed) Labs Reviewed  COMPREHENSIVE METABOLIC PANEL - Abnormal; Notable for the following components:       Result Value   Sodium 132 (*)    Potassium 3.4 (*)    Chloride 97 (*)    Glucose, Bld 117 (*)    Calcium 8.1 (*)    Total Protein 6.1 (*)    Albumin 3.1 (*)    AST 42 (*)    Alkaline Phosphatase 356 (*)    Total Bilirubin 3.1 (*)    All other components within normal limits  CBC WITH DIFFERENTIAL/PLATELET - Abnormal; Notable for the following components:   RBC 1.94 (*)    Hemoglobin 7.2 (*)    HCT 22.0 (*)    MCV 113.4 (*)    MCH 37.1 (*)    RDW 25.7 (*)    All other components within normal limits  BRAIN NATRIURETIC PEPTIDE - Abnormal; Notable for the following components:   B Natriuretic Peptide 641.0 (*)    All other components within normal limits  POC OCCULT BLOOD, ED - Abnormal  TROPONIN I (HIGH SENSITIVITY) - Abnormal; Notable for the following components:   Troponin I (High Sensitivity) 22 (*)    All other components within normal limits  TROPONIN I (HIGH SENSITIVITY) - Abnormal; Notable for the following components:   Troponin I (High Sensitivity) 22 (*)    All other components within normal limits  PROTIME-INR  TYPE AND SCREEN  PREPARE RBC (CROSSMATCH)    EKG EKG Interpretation  Date/Time:  Thursday February 14 2023 09:37:16 EDT Ventricular Rate:  109 PR Interval:    QRS Duration: 102 QT Interval:  391 QTC Calculation: 527 R Axis:   89 Text Interpretation: Atrial fibrillation Borderline right axis deviation Low voltage, extremity leads Nonspecific repol abnormality, diffuse leads Prolonged QT interval Confirmed by Pricilla Loveless [phone]) on 02/14/2023 9:45:10 AM  Radiology DG Chest Portable 1 View  Result Date: 02/14/2023 CLINICAL DATA:  Provided history: Fatigue. EXAM: PORTABLE CHEST 1 VIEW COMPARISON:  Chest radiographs 12/28/2022 and earlier. FINDINGS: Prior median sternotomy/CABG. Cardiomegaly. Aortic atherosclerosis. Central pulmonary vascular congestion. Prominence of the intra lung markings likely reflecting interstitial edema. Moderate right pleural  effusion with underlying right basilar atelectasis and/or airspace consolidation. Small left pleural effusion. Degenerative changes of the spine. IMPRESSION: 1. Cardiomegaly with central pulmonary vascular congestion and pulmonary interstitial edema. 2. Moderate right pleural effusion with underlying right basilar atelectasis and/or airspace consolidation. 3. Small left pleural effusion. 4.  Aortic Atherosclerosis (ICD10-I70.0). Electronically Signed   By: Jackey Loge D.O.   On: 02/14/2023 09:43    Procedures .Critical Care  Performed by: Pricilla Loveless, MD Authorized by: Pricilla Loveless, MD   Critical care provider statement:    Critical care time (minutes):  35   Critical care time was exclusive of:  Separately billable procedures and treating other patients   Critical care was necessary to treat  or prevent imminent or life-threatening deterioration of the following conditions:  Circulatory failure and respiratory failure   Critical care was time spent personally by me on the following activities:  Development of treatment plan with patient or surrogate, discussions with consultants, evaluation of patient's response to treatment, examination of patient, ordering and review of laboratory studies, ordering and review of radiographic studies, ordering and performing treatments and interventions, pulse oximetry, re-evaluation of patient's condition and review of old charts     Medications Ordered in ED Medications  0.9 %  sodium chloride infusion (Manually program via Guardrails IV Fluids) (has no administration in time range)  pantoprazole (PROTONIX) injection 40 mg (has no administration in time range)  pantoprazole (PROTONIX) 80 mg /NS 100 mL IVPB (0 mg Intravenous Stopped 02/14/23 1016)  potassium chloride SA (KLOR-CON M) CR tablet 40 mEq (40 mEq Oral Given 02/14/23 1108)  furosemide (LASIX) injection 40 mg (40 mg Intravenous Given 02/14/23 1109)    ED Course/ Medical Decision Making/ A&P                              Medical Decision Making Amount and/or Complexity of Data Reviewed Labs: ordered.    Details: Hemoglobin 7.2.  Mildly improved from before.  BNP is 640. Radiology: ordered and independent interpretation performed.    Details: CHF ECG/medicine tests: ordered and independent interpretation performed.    Details: A-fib  Risk Prescription drug management. Decision regarding hospitalization.   Patient presents with overall fatigue and easily wearing out.  Could be a combination of CHF versus acute on chronic anemia.  He is Hemoccult positive and with his recent GI workup and concern for duodenal ulcer I will give him a dose of IV Protonix.  I have discussed with Dr. Jena Gauss, will see in consultation for GI.  Otherwise discussed with Dr. Mariea Clonts for admission.  I have given him a dose of Lasix and ordered a unit of blood.  Besides some tachycardia he is otherwise stable.        Final Clinical Impression(s) / ED Diagnoses Final diagnoses:  Symptomatic anemia  Acute on chronic congestive heart failure, unspecified heart failure type [facility])    Rx / DC Orders ED Discharge Orders     None         Pricilla Loveless, MD 02/14/23 1600

## 2023-02-14 NOTE — H&P (Signed)
Patient Demographics:    Leonard Cisneros, is a 82 y.o. male  MRN: [id number]   DOB - [birth year]  Admit Date - 02/14/2023  Outpatient Primary MD for the patient is [name], FNP   Assessment & Plan:   Assessment and Plan:  1)HFpEF/ history of ischemic cardiomyopathy/chronic diastolic dysfunction CHF----Chest x-ray consistent with pulmonary venous congestion pulmonary interstitial edema with right pleural effusion and atelectasis --Has chronic venous stasis dermatitis right leg is weeping --BNP 641 is not too far from baseline Troponin requested and pending -EKG is A-fib without acute findings -IV Lasix as ordered Daily weight and fluid input and output monitoring  2) acute on chronic anemia--history of GI bleed despite stopping Eliquis Hgb dropping down --No melena, no hematemesis, no bright red blood per rectum -Transfuse 1 unit of PRBC for symptomatic anemia - GI consult requested plans for repeat EGD on 02/15/2023  3)moderate to severe aortic stenosis--May be contributing to #1 above -Be judicious with afterload reduction -Management as above #1  4)PAFib-previously on Eliquis  , stopped due to recurrent GI bleed (currently undergoing evaluation for Watchman device) = Continue metoprolol for rate control  5)CAD-history of prior CABG x 3 vessels--aspirin on hold -Continue metoprolol and atorvastatin  6) hypokalemia hyponatremia/hypochloremia-----in the setting of diuretic use -Replace potassium  7)PUD--history of duodenal ulcer -March 2024 which was redemonstrated on EGD in April 2024  -IV Protonix, GI consult appreciated plans for repeat EGD on 02/15/2023    Dispo: The patient is from: Home              Anticipated d/c is to: Home              Anticipated d/c date is: 1 day              Patient  currently is not medically stable to d/c. Barriers: Not Clinically Stable-   With History of - Reviewed by me  Past Medical History:  Diagnosis Date   Aortic stenosis    Atrial flutter (HCC)    a. s/p remote ablation by Dr. Ladona Ridgel.   CAD (coronary artery disease)    a. s/p CABG 2003. b. 01/2016: unstable angina - s/p BMS to SVG-ramus intermediate, patent LIMA-mLAD, patent RIMA-dRCA.    Chronic lower back pain    Essential hypertension    Hyperlipidemia    Ischemic cardiomyopathy    a. Cath 01/2016 -  EF 50-55% with mild distal inferior hypocontractility.   Peripheral neuropathy 04/04/2014   Permanent atrial fibrillation [facility])       Past Surgical History:  Procedure Laterality Date   BACK SURGERY     BIOPSY  11/21/2022   Procedure: BIOPSY;  Surgeon: Napoleon Form, MD;  Location: [facility];  Service: Gastroenterology;;   CARDIAC CATHETERIZATION  ~ 2000; 2003   CARDIAC CATHETERIZATION N/A 02/01/2016   Procedure: Left Heart Cath and Cors/Grafts Angiography;  Surgeon: Lennette Bihari, MD;  Location: [facility];  Service: Cardiovascular;  Laterality: N/A;   CARDIAC CATHETERIZATION N/A 02/01/2016   Procedure: Coronary Stent Intervention;  Surgeon: Lennette Bihari, MD;  Location: [facility];  Service: Cardiovascular;  Laterality: N/A;   CATARACT EXTRACTION W/PHACO Left 02/09/2019   Procedure: CATARACT EXTRACTION PHACO AND INTRAOCULAR LENS PLACEMENT (IOC);  Surgeon: Fabio Pierce, MD;  Location: [facility] ORS;  Service: Ophthalmology;  Laterality: Left;  CDE: 27.70   CATARACT EXTRACTION W/PHACO Right 02/23/2019   Procedure: CATARACT EXTRACTION PHACO AND INTRAOCULAR LENS PLACEMENT RIGHT EYE;  Surgeon: Fabio Pierce, MD;  Location: [facility] OR[facility];  Service: Ophthalmology;  Laterality: Right;  CDE: 10.15   COLONOSCOPY  06/04/2002   Normal colon and rectum   COLONOSCOPY  07/08/2012   Procedure: COLONOSCOPY;  Surgeon: Corbin Ade, MD;  Location: [facility];  Service: Endoscopy;   Laterality: N/A;  11:30   COLONOSCOPY N/A 10/02/2017   Procedure: COLONOSCOPY;  Surgeon: Corbin Ade, MD;  Location: [facility];  Service: Endoscopy;  Laterality: N/A;  10:30am   CORONARY ANGIOPLASTY WITH STENT PLACEMENT  02/01/2016   CORONARY ARTERY BYPASS GRAFT  2003   LIMA to LAD,LIMA to distal RCA,SVG to ramus intermediate vessel.   ESOPHAGOGASTRODUODENOSCOPY (EGD) WITH PROPOFOL N/A 11/21/2022   Procedure: ESOPHAGOGASTRODUODENOSCOPY (EGD) WITH PROPOFOL;  Surgeon: Napoleon Form, MD;  Location: [facility];  Service: Gastroenterology;  Laterality: N/A;   ESOPHAGOGASTRODUODENOSCOPY (EGD) WITH PROPOFOL N/A 12/27/2022   Procedure: ESOPHAGOGASTRODUODENOSCOPY (EGD) WITH PROPOFOL;  Surgeon: Lanelle Bal, DO;  Location: [facility];  Service: Endoscopy;  Laterality: N/A;   FEMORAL REVISION Right 03/30/2019   Procedure: Femoral revision right total hip arthroplasty;  Surgeon: Ollen Gross, MD;  Location: [facility];  Service: Orthopedics;  Laterality: Right;    FOREIGN BODY REMOVAL Right 04/02/2014   Procedure: FOREIGN BODY REMOVAL RIGHT FOOT;  Surgeon: Dalia Heading, MD;  Location: [facility];  Service: General;  Laterality: Right;   JOINT REPLACEMENT     JOINT REPLACEMENT     LUMBAR DISC SURGERY     REVISION TOTAL HIP ARTHROPLASTY Bilateral 2005-2012   right-left   RIGHT/LEFT HEART CATH AND CORONARY/GRAFT ANGIOGRAPHY N/A 09/27/2022   Procedure: RIGHT/LEFT HEART CATH AND CORONARY/GRAFT ANGIOGRAPHY;  Surgeon: [place], Peter M, MD;  Location: [facility];  Service: Cardiovascular;  Laterality: N/A;   SHOULDER OPEN ROTATOR CUFF REPAIR Right    TOTAL HIP ARTHROPLASTY Right 1999   TOTAL HIP ARTHROPLASTY Left 2008   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      Chief Complaint  Patient  presents with   abnormal labs      HPI:    Leonard Cisneros  is a 82 y.o. male with history of CAD with prior CABG x 3, PAfib, previously on Eliquis  , stopped due to recurrent GI bleed (currently undergoing evaluation for Watchman device), ischemic cardiomyopathy/HFpEF,  HTN,   HLD, moderate to severe aortic stenosis, and history of duodenal ulcer in March 2024 which was redemonstrated on EGD in April 2024 who was sent to ED by PCP initially on 02/13/2023 for possible transfusion due to hemoglobin of 6.7 with dyspnea -Patient left the ED due to long wait times =-Returns on 02/14/2023 with complaints of dyspnea on exertion -Repeat hemoglobin 7.2 without transfusion -No melena, no hematemesis, no bright red blood per rectum -Denies frank chest  pains or palpitations or dizziness -Has chronic venous stasis dermatitis right leg is weeping No fever  Or chills   No Nausea, Vomiting or Diarrhea -Chest x-ray consistent with pulmonary venous congestion pulmonary interstitial edema with right pleural effusion and atelectasis -Potassium is 3.4, sodium 132, chloride 97, creatinine 1.1 -BNP 641 is not too far from baseline Troponin requested and pending -EKG is A-fib without acute findings  Review of systems:    In addition to the HPI above,   A full Review of  Systems was done, all other systems reviewed are negative except as noted above in HPI , .    Social History:  Reviewed by me    Social History   Tobacco Use   Smoking status: Former    Packs/day: 0.50    Years: 2.00    Additional pack years: 0.00    Total pack years: 1.00    Types: Cigarettes    Quit date: 09/03/1966    Years since quitting: 56.4   Smokeless tobacco: Never  Substance Use Topics   Alcohol use: Yes    Alcohol/week: 11.0 standard drinks of alcohol    Types: 7 Glasses of wine, 4 Cans of beer per week    Comment: a glass of wine with dinner/ beer on the weekend       Family History :  Reviewed by me    Family  History  Problem Relation Age of Onset   Heart attack Brother    Colon cancer Neg Hx     Home Medications:   Prior to Admission medications   Medication Sig Start Date End Date Taking? Authorizing Provider  atorvastatin (LIPITOR) 80 MG tablet TAKE 1 TABLET BY MOUTH EVERY DAY Patient taking differently: Take 80 mg by mouth daily. 11/30/22  Yes [name], MD  Iron, Ferrous Sulfate, 325 (65 Fe) MG TABS Take 325 mg by mouth daily. 12/02/22  Yes [name], FNP  metoprolol succinate (TOPROL-XL) 25 MG 24 hr tablet Take 1 tablet (25 mg total) by mouth daily. 02/11/23  Yes [name], MD  pantoprazole (PROTONIX) 40 MG tablet Take 1 tablet (40 mg total) by mouth 2 (two) times daily. 11/22/22 02/20/23 Yes Almon Hercules, MD  potassium chloride (KLOR-CON M10) 10 MEQ tablet Take 3 tablets (30 mEq total) by mouth daily. 02/11/23  Yes [name], MD  silver sulfADIAZINE (SILVADENE) 1 % cream Apply 1 Application topically daily. 02/12/23  Yes [name], FNP  torsemide (DEMADEX) 20 MG tablet Take 3 tablets (60 mg total) by mouth daily. 01/29/23 01/24/24 Yes [name], FNP  nitroGLYCERIN (NITROSTAT) 0.4 MG SL tablet Place 1 tablet (0.4 mg total) under the tongue every 5 (five) minutes x 3 doses as needed for chest pain (if no relief after 3rd dose, proceed to the ED for an evaluation or call 911). 09/05/22   [name], MD     Allergies:     Allergies  Allergen Reactions   Augmentin [Amoxicillin-Pot Clavulanate] Nausea And Vomiting and Other (See Comments)    Has patient had a PCN reaction causing immediate rash, facial/tongue/throat swelling, SOB or lightheadedness with hypotension: Unknown Has patient had a PCN reaction causing severe rash involving mucus membranes or skin necrosis: No Has patient had a PCN reaction that required hospitalization: No Has patient had a PCN reaction occurring within the last 10 years: No If all of the above answers are "NO", then may  proceed with Cephalosporin use.  Physical Exam:   Vitals  Blood pressure (!) 106/56, pulse 96, temperature 97.6 F (36.4 C), resp. rate 18, height 5\' 11"  (1.803 m), weight 87.6 kg, SpO2 96 %.  Physical Examination: General appearance - alert,  in no distress  Mental status - alert, oriented to person, place, and time,  Eyes - sclera anicteric Neck - supple, no JVD elevation , Chest -diminished sounds, faint bibasilar rales Heart - S1 and S2 normal, regular , prior sternotomy scar, 3/6 SM Abdomen - soft, nontender, nondistended, +BS Neurological - screening mental status exam normal, neck supple without rigidity, cranial nerves II through XII intact, DTR's normal and symmetric Extremities - +ve pedal edema noted, right leg is weeping, chronic venous stasis on both lower extremities,  intact peripheral pulses  Skin - warm, dry     Data Review:    CBC Recent Labs  Lab 02/12/23 1017 02/13/23 0824 02/14/23 0913  WBC 6.5  --  5.4  HGB 6.7* 7.1* 7.2*  HCT 19.8*  --  22.0*  PLT 221  --  227  MCV 103*  --  113.4*  MCH 34.7*  --  37.1*  MCHC 33.8  --  32.7  RDW 21.3*  --  25.7*  LYMPHSABS 0.7  --  0.8  MONOABS  --   --  0.6  EOSABS 0.2  --  0.2  BASOSABS 0.0  --  0.0   ------------------------------------------------------------------------------------------------------------------  Chemistries  Recent Labs  Lab 02/11/23 1156 02/13/23 0824 02/14/23 0913  NA 138  --  132*  K 4.0  --  3.4*  CL 100  --  97*  CO2 26  --  26  GLUCOSE 104*  --  117*  BUN 11  --  12  CREATININE 1.09  --  1.14  CALCIUM 8.6  --  8.1*  AST  --   --  42*  ALT  --   --  31  ALKPHOS  --  436* 356*  BILITOT  --   --  3.1*   ------------------------------------------------------------------------------------------------------------------ estimated creatinine clearance is 54.1 mL/min (by C-G formula based on SCr of 1.14  mg/dL). ------------------------------------------------------------------------------------------------------------------ Recent Labs    02/12/23 1017  TSH 2.980     Coagulation profile Recent Labs  Lab 02/14/23 0913  INR 1.2   ------------------------------------------------------------------------------------------------------------------------------------------------------------------------------------------------------------------------------------    Component Value Date/Time   BNP 641.0 (H) 02/14/2023 0913     ---------------------------------------------------------------------------------------------------------------  Urinalysis    Component Value Date/Time   COLORURINE YELLOW 11/19/2022 0115   APPEARANCEUR CLEAR 11/19/2022 0115   LABSPEC 1.006 11/19/2022 0115   PHURINE 6.0 11/19/2022 0115   GLUCOSEU NEGATIVE 11/19/2022 0115   HGBUR SMALL (A) 11/19/2022 0115   BILIRUBINUR NEGATIVE 11/19/2022 0115   KETONESUR NEGATIVE 11/19/2022 0115   PROTEINUR NEGATIVE 11/19/2022 0115   UROBILINOGEN 0.2 11/28/2010 0915   NITRITE NEGATIVE 11/19/2022 0115   LEUKOCYTESUR LARGE (A) 11/19/2022 0115    ----------------------------------------------------------------------------------------------------------------   Imaging Results:    DG Chest Portable 1 View  Result Date: 02/14/2023 CLINICAL DATA:  Provided history: Fatigue. EXAM: PORTABLE CHEST 1 VIEW COMPARISON:  Chest radiographs 12/28/2022 and earlier. FINDINGS: Prior median sternotomy/CABG. Cardiomegaly. Aortic atherosclerosis. Central pulmonary vascular congestion. Prominence of the intra lung markings likely reflecting interstitial edema. Moderate right pleural effusion with underlying right basilar atelectasis and/or airspace consolidation. Small left pleural effusion. Degenerative changes of the spine. IMPRESSION: 1. Cardiomegaly with central pulmonary vascular congestion and pulmonary interstitial edema. 2. Moderate  right pleural effusion with underlying right basilar atelectasis and/or airspace  consolidation. 3. Small left pleural effusion. 4.  Aortic Atherosclerosis (ICD10-I70.0). Electronically Signed   By: Jackey Loge D.O.   On: 02/14/2023 09:43    Radiological Exams on Admission: DG Chest Portable 1 View  Result Date: 02/14/2023 CLINICAL DATA:  Provided history: Fatigue. EXAM: PORTABLE CHEST 1 VIEW COMPARISON:  Chest radiographs 12/28/2022 and earlier. FINDINGS: Prior median sternotomy/CABG. Cardiomegaly. Aortic atherosclerosis. Central pulmonary vascular congestion. Prominence of the intra lung markings likely reflecting interstitial edema. Moderate right pleural effusion with underlying right basilar atelectasis and/or airspace consolidation. Small left pleural effusion. Degenerative changes of the spine. IMPRESSION: 1. Cardiomegaly with central pulmonary vascular congestion and pulmonary interstitial edema. 2. Moderate right pleural effusion with underlying right basilar atelectasis and/or airspace consolidation. 3. Small left pleural effusion. 4.  Aortic Atherosclerosis (ICD10-I70.0). Electronically Signed   By: Jackey Loge D.O.   On: 02/14/2023 09:43    DVT Prophylaxis -SCD /Gi Bleed AM Labs Ordered, also please review Full Orders  Family Communication: Admission, patients condition and plan of care including tests being ordered have been discussed with the patient  who indicate understanding and agree with the plan   Condition  -stable  [name] M.D on 02/14/2023 at 6:20 PM Go to [email or web address] -  for contact info  [facility] - Office  [phone]

## 2023-02-14 NOTE — ED Notes (Signed)
POC Occult Blood was positive. Epic just didn't want to accept that.

## 2023-02-14 NOTE — ED Notes (Signed)
Edp aware pt met sepsis protocol. Edp at bedside now

## 2023-02-15 ENCOUNTER — Telehealth: Payer: Self-pay | Admitting: Gastroenterology

## 2023-02-15 ENCOUNTER — Observation Stay (HOSPITAL_COMMUNITY): Payer: Medicare HMO | Admitting: Anesthesiology

## 2023-02-15 ENCOUNTER — Encounter (HOSPITAL_COMMUNITY): Payer: Medicare HMO

## 2023-02-15 ENCOUNTER — Encounter (HOSPITAL_COMMUNITY): Payer: Self-pay | Admitting: Family Medicine

## 2023-02-15 ENCOUNTER — Encounter (HOSPITAL_COMMUNITY)
Admission: EM | Disposition: A | Discharge status: Home / Self Care | Payer: Self-pay | Source: Home / Self Care | Attending: Family Medicine

## 2023-02-15 DIAGNOSIS — K222 Esophageal obstruction: Secondary | ICD-10-CM

## 2023-02-15 DIAGNOSIS — Z96643 Presence of artificial hip joint, bilateral: Secondary | ICD-10-CM | POA: Diagnosis not present

## 2023-02-15 DIAGNOSIS — K2951 Unspecified chronic gastritis with bleeding: Secondary | ICD-10-CM | POA: Diagnosis not present

## 2023-02-15 DIAGNOSIS — Z8249 Family history of ischemic heart disease and other diseases of the circulatory system: Secondary | ICD-10-CM | POA: Diagnosis not present

## 2023-02-15 DIAGNOSIS — I872 Venous insufficiency (chronic) (peripheral): Secondary | ICD-10-CM | POA: Diagnosis not present

## 2023-02-15 DIAGNOSIS — I2510 Atherosclerotic heart disease of native coronary artery without angina pectoris: Secondary | ICD-10-CM | POA: Diagnosis not present

## 2023-02-15 DIAGNOSIS — K2970 Gastritis, unspecified, without bleeding: Secondary | ICD-10-CM

## 2023-02-15 DIAGNOSIS — K269 Duodenal ulcer, unspecified as acute or chronic, without hemorrhage or perforation: Secondary | ICD-10-CM

## 2023-02-15 DIAGNOSIS — E876 Hypokalemia: Secondary | ICD-10-CM | POA: Diagnosis not present

## 2023-02-15 DIAGNOSIS — I509 Heart failure, unspecified: Secondary | ICD-10-CM

## 2023-02-15 DIAGNOSIS — D62 Acute posthemorrhagic anemia: Secondary | ICD-10-CM | POA: Diagnosis not present

## 2023-02-15 DIAGNOSIS — I10 Essential (primary) hypertension: Secondary | ICD-10-CM | POA: Diagnosis not present

## 2023-02-15 DIAGNOSIS — I25119 Atherosclerotic heart disease of native coronary artery with unspecified angina pectoris: Secondary | ICD-10-CM | POA: Diagnosis not present

## 2023-02-15 DIAGNOSIS — I4821 Permanent atrial fibrillation: Secondary | ICD-10-CM | POA: Diagnosis not present

## 2023-02-15 DIAGNOSIS — Z955 Presence of coronary angioplasty implant and graft: Secondary | ICD-10-CM | POA: Diagnosis not present

## 2023-02-15 DIAGNOSIS — Z87891 Personal history of nicotine dependence: Secondary | ICD-10-CM

## 2023-02-15 DIAGNOSIS — I110 Hypertensive heart disease with heart failure: Secondary | ICD-10-CM

## 2023-02-15 DIAGNOSIS — Z951 Presence of aortocoronary bypass graft: Secondary | ICD-10-CM | POA: Diagnosis not present

## 2023-02-15 DIAGNOSIS — E878 Other disorders of electrolyte and fluid balance, not elsewhere classified: Secondary | ICD-10-CM | POA: Diagnosis not present

## 2023-02-15 DIAGNOSIS — I350 Nonrheumatic aortic (valve) stenosis: Secondary | ICD-10-CM | POA: Diagnosis not present

## 2023-02-15 DIAGNOSIS — L89152 Pressure ulcer of sacral region, stage 2: Secondary | ICD-10-CM | POA: Diagnosis not present

## 2023-02-15 DIAGNOSIS — R0602 Shortness of breath: Secondary | ICD-10-CM | POA: Diagnosis not present

## 2023-02-15 DIAGNOSIS — I739 Peripheral vascular disease, unspecified: Secondary | ICD-10-CM | POA: Diagnosis not present

## 2023-02-15 DIAGNOSIS — I5033 Acute on chronic diastolic (congestive) heart failure: Secondary | ICD-10-CM | POA: Diagnosis not present

## 2023-02-15 DIAGNOSIS — I255 Ischemic cardiomyopathy: Secondary | ICD-10-CM | POA: Diagnosis not present

## 2023-02-15 DIAGNOSIS — Z79899 Other long term (current) drug therapy: Secondary | ICD-10-CM | POA: Diagnosis not present

## 2023-02-15 DIAGNOSIS — E785 Hyperlipidemia, unspecified: Secondary | ICD-10-CM | POA: Diagnosis not present

## 2023-02-15 DIAGNOSIS — E871 Hypo-osmolality and hyponatremia: Secondary | ICD-10-CM | POA: Diagnosis not present

## 2023-02-15 HISTORY — PX: ESOPHAGOGASTRODUODENOSCOPY (EGD) WITH PROPOFOL: SHX5813

## 2023-02-15 HISTORY — PX: BIOPSY: SHX5522

## 2023-02-15 LAB — BASIC METABOLIC PANEL
Anion gap: 8 (ref 5–15)
BUN: 11 mg/dL (ref 8–23)
CO2: 27 mmol/L (ref 22–32)
Calcium: 8 mg/dL — ABNORMAL LOW (ref 8.9–10.3)
Chloride: 100 mmol/L (ref 98–111)
Creatinine, Ser: 0.96 mg/dL (ref 0.61–1.24)
GFR, Estimated: >60 mL/min (ref >60)
Glucose, Bld: 99 mg/dL (ref 70–99)
Potassium: 3.7 mmol/L (ref 3.5–5.1)
Sodium: 135 mmol/L (ref 135–145)

## 2023-02-15 LAB — CBC
HCT: 23.4 % — ABNORMAL LOW (ref 39.0–52.0)
Hemoglobin: 7.6 g/dL — ABNORMAL LOW (ref 13.0–17.0)
MCH: 36.4 pg — ABNORMAL HIGH (ref 26.0–34.0)
MCHC: 32.5 g/dL (ref 30.0–36.0)
MCV: 112 fL — ABNORMAL HIGH (ref 80.0–100.0)
Platelets: 203 10*3/uL (ref 150–400)
RBC: 2.09 MIL/uL — ABNORMAL LOW (ref 4.22–5.81)
RDW: 26.5 % — ABNORMAL HIGH (ref 11.5–15.5)
WBC: 5.8 10*3/uL (ref 4.0–10.5)
nRBC: 0 % (ref 0.0–0.2)

## 2023-02-15 LAB — TYPE AND SCREEN

## 2023-02-15 LAB — BPAM RBC
Blood Product Expiration Date: 202407182359
ISSUE DATE / TIME: 202406130058
Unit Type and Rh: 5100

## 2023-02-15 LAB — OCCULT BLOOD, POC DEVICE: Fecal Occult Bld: POSITIVE — AB

## 2023-02-15 SURGERY — ESOPHAGOGASTRODUODENOSCOPY (EGD) WITH PROPOFOL
Anesthesia: General

## 2023-02-15 MED ORDER — LACTATED RINGERS IV SOLN: INTRAVENOUS | Status: DC

## 2023-02-15 MED ORDER — PHENYLEPHRINE HCL (PRESSORS) 10 MG/ML IV SOLN
INTRAVENOUS | Status: DC | PRN
  Administered 2023-02-15: 100 ug via INTRAVENOUS

## 2023-02-15 MED ORDER — LIDOCAINE HCL (CARDIAC) PF 100 MG/5ML IV SOSY
PREFILLED_SYRINGE | INTRAVENOUS | Status: DC | PRN
  Administered 2023-02-15: 50 mg via INTRAVENOUS

## 2023-02-15 MED ORDER — PROPOFOL 10 MG/ML IV BOLUS
INTRAVENOUS | Status: DC | PRN
  Administered 2023-02-15: 20 mg via INTRAVENOUS
  Administered 2023-02-15: 80 mg via INTRAVENOUS
  Administered 2023-02-15: 50 mg via INTRAVENOUS

## 2023-02-15 NOTE — Anesthesia Preprocedure Evaluation (Signed)
Anesthesia Evaluation  Patient identified by MRN, date of birth, ID band Patient awake    Reviewed: Allergy & Precautions, H&P , NPO status , [name] Chart, lab work & pertinent test results, reviewed documented beta blocker date and time   Airway Mallampati: II  TM Distance: >3 FB Neck ROM: Full    Dental  (+) Dental Advisory Given, Partial Upper, Missing   Pulmonary former smoker IMPRESSION: 1. Cardiomegaly with central pulmonary vascular congestion and pulmonary interstitial edema. 2. Moderate right pleural effusion with underlying right basilar atelectasis and/or airspace consolidation. 3. Small left pleural effusion. 4.  Aortic Atherosclerosis (ICD10-I70.0).     Electronically Signed   By: Jackey Loge D.O.   On: 02/14/2023 09:43    Pulmonary exam normal breath sounds clear to auscultation       Cardiovascular Exercise Tolerance: Good hypertension, Pt. on medications and Pt. on home beta blockers + angina  + CAD, + Cardiac Stents, + CABG, + Peripheral Vascular Disease and +CHF  + dysrhythmias Atrial Fibrillation  Rhythm:Regular Rate:Normal + Systolic murmurs Left Ventricle: Left ventricular ejection fraction, by estimation, is 55  to 60%. The left ventricle has normal function. The left ventricle has no  regional wall motion abnormalities. The left ventricular internal cavity  size was normal in size. There is   moderate concentric left ventricular hypertrophy. Left ventricular  diastolic function could not be evaluated due to atrial fibrillation. Left  ventricular diastolic parameters are indeterminate.   Right Ventricle: The right ventricular size is mildly enlarged. No  increase in right ventricular wall thickness. Right ventricular systolic  function is normal. Tricuspid regurgitation signal is inadequate for  assessing PA pressure.   Left Atrium: Left atrial size was severely dilated.   Right Atrium: Right  atrial size was mildly dilated.   Pericardium: Left pleural effusion noted. There is no evidence of  pericardial effusion.   Mitral Valve: The mitral valve is degenerative in appearance. Severe  mitral annular calcification. Mild mitral valve regurgitation. MV peak  gradient, 15.8 mmHg. The mean mitral valve gradient is 4.0 mmHg.   Tricuspid Valve: The tricuspid valve is grossly normal. Tricuspid valve  regurgitation is mild.   Aortic Valve: The aortic valve is tricuspid. There is moderate  calcification of the aortic valve. There is mild to moderate aortic valve  annular calcification. Aortic valve regurgitation is not visualized.  Moderate to severe aortic stenosis is present.  Aortic valve mean gradient measures 24.0 mmHg. Aortic valve peak gradient  measures 42.1 mmHg. Aortic valve area, by VTI measures 1.06 cm.   Pulmonic Valve: The pulmonic valve was grossly normal. Pulmonic valve  regurgitation is trivial.   Aorta: The aortic root is normal in size and structure and aortic  dilatation noted. There is mild dilatation of the ascending aorta,  measuring 39 mm.   Venous: The inferior vena cava is dilated in size with less than 50%  respiratory variability, suggesting right atrial pressure of 15 mmHg.   IAS/Shunts: No atrial level shunt detected by color flow Doppler.   Additional Comments: There is pleural effusion in the left lateral region.      Neuro/Psych  Neuromuscular disease  negative psych ROS   GI/Hepatic Neg liver ROS, PUD,GERD  Medicated and Controlled,,  Endo/Other  negative endocrine ROS    Renal/GU negative Renal ROS  negative genitourinary   Musculoskeletal negative musculoskeletal ROS (+)    Abdominal   Peds negative pediatric ROS (+)  Hematology  (+) Blood dyscrasia, anemia  Anesthesia Other Findings Doesn't use oxygen at home, pulmonary congestion on CXR, On oxygen 3 l/m  Reproductive/Obstetrics negative OB ROS                               Anesthesia Physical Anesthesia Plan  ASA: 4  Anesthesia Plan: General   Post-op Pain Management: Minimal or no pain anticipated   Induction: Intravenous  PONV Risk Score and Plan: Propofol infusion  Airway Management Planned: Nasal Cannula and Natural Airway  Additional Equipment:   Intra-op Plan:   Post-operative Plan:   Informed Consent: I have reviewed the patients History and Physical, chart, labs and discussed the procedure including the risks, benefits and alternatives for the proposed anesthesia with the patient or authorized representative who has indicated his/her understanding and acceptance.     Dental advisory given  Plan Discussed with: Surgeon and CRNA  Anesthesia Plan Comments:          Anesthesia Quick Evaluation

## 2023-02-15 NOTE — Consult Note (Addendum)
WOC Nurse Consult Note: Reason for Consult: Consult requested for right leg wound.  Performed remotely after review of progress notes and photos in the EMR.  Wound type: Right anterior calf with full thickness wound, red and moist, weeping large amt yellow fluid.  Generalized edema and erythremia surrounding.  Dressing procedure/placement/frequency: Topical treatment orders provided for bedside nurses to perform as follows to absorb drainage and provide antimicrobial benefits: Cut piece of Aquacel [place] # [phone]) and apply to right leg wound Q day, then cover with ABD pad and kerlex.  Please re-consult if further assistance is needed.  Thank-you,  Cammie Mcgee MSN, RN, CWOCN, [place], CNS [phone]

## 2023-02-15 NOTE — Transfer of Care (Signed)
Immediate Anesthesia Transfer of Care Note  Patient: Leonard Cisneros  Procedure(s) Performed: ESOPHAGOGASTRODUODENOSCOPY (EGD) WITH PROPOFOL BIOPSY  Patient Location: PACU  Anesthesia Type:General  Level of Consciousness: awake  Airway & Oxygen Therapy: Patient Spontanous Breathing  Post-op Assessment: Report given to RN  Post vital signs: Reviewed and stable  Last Vitals:  Vitals Value Taken Time  BP    Temp 36.2 C 02/15/23 1402  Pulse 85 02/15/23 1405  Resp 18 02/15/23 1406  SpO2 92 % 02/15/23 1405  Vitals shown include unvalidated device data.  Last Pain:  Vitals:   02/15/23 1345  TempSrc:   PainSc: 0-No pain         Complications: No notable events documented.

## 2023-02-15 NOTE — TOC CM/SW Note (Signed)
Transition of Care [facility]) - Inpatient Brief Assessment   Patient Details  Name: Leonard Cisneros MRN: [id number] Date of Birth: 1941-02-08  [facility] of [facility]) CM/SW Contact:    Elliot Gault, LCSW Phone Number: 02/15/2023, 11:35 AM   Clinical Narrative:  Transition of Care Department [facility]) has reviewed patient and no TOC needs have been identified at this time. We will continue to monitor patient advancement through interdisciplinary progression rounds. If new patient transition needs arise, please place a TOC consult.   Transition of Care Asessment: Insurance and Status: Insurance coverage has been reviewed Patient has primary care physician: Yes Home environment has been reviewed: from home with spouse Prior level of function:: independent   Social Determinants of Health Reivew: SDOH reviewed no interventions necessary Readmission risk has been reviewed: Yes Transition of care needs: no transition of care needs at this time

## 2023-02-15 NOTE — Op Note (Signed)
[facility] Patient Name: Leonard Cisneros Procedure Date: 02/15/2023 1:41 PM MRN: [id number] Date of Birth: [birth year] Attending MD: Hennie Duos. Marletta Lor , [place], [phone] CSN: [id number] Age: 82 Admit Type: Inpatient Procedure:                Upper GI endoscopy Indications:              Acute post hemorrhagic anemia Providers:                Hennie Duos. Marletta Lor, DO, Crystal Page, Dyann Ruddle Referring MD:              Medicines:                See the Anesthesia note for documentation of the                            administered medications Complications:            No immediate complications. Estimated Blood Loss:     Estimated blood loss was minimal. Procedure:                Pre-Anesthesia Assessment:                           - The anesthesia plan was to use monitored                            anesthesia care (MAC).                           After obtaining informed consent, the endoscope was                            passed under direct vision. Throughout the                            procedure, the patient's blood pressure, pulse, and                            oxygen saturations were monitored continuously. The                            GIF-H190 ([id number]) scope was introduced through the                            mouth, and advanced to the second part of duodenum.                            The upper GI endoscopy was accomplished without                            difficulty. The patient tolerated the procedure                            well. Scope In: 1:50:25 PM Scope Out: 1:53:47 PM Total Procedure Duration: 0 hours 3 minutes 22 seconds  Findings:      A mild Schatzki ring was found in  the lower third of the esophagus.      Localized mild inflammation characterized by erosions and erythema was       found in the gastric antrum. Biopsies were taken with a cold forceps for       Helicobacter pylori testing.      One non-bleeding cratered duodenal ulcer with a clean  ulcer base       (Forrest Class III) was found in the first portion of the duodenum. The       lesion was 3 mm in largest dimension. Continues to improve. Impression:               - Mild Schatzki ring.                           - Gastritis. Biopsied.                           - Non-bleeding duodenal ulcer with a clean ulcer                            base (Forrest Class III). Moderate Sedation:      Per Anesthesia Care Recommendation:           - Return patient to hospital ward for ongoing care.                           - Resume regular diet.                           - Use a proton pump inhibitor PO BID.                           - No ibuprofen, naproxen, or other non-steroidal                            anti-inflammatory drugs.                           - Will update colonoscopy in outpatient setting. Procedure Code(s):        --- Professional ---                           [phone], Esophagogastroduodenoscopy, flexible,                            transoral; with biopsy, single or multiple Diagnosis Code(s):        --- Professional ---                           K22.2, Esophageal obstruction                           K29.70, Gastritis, unspecified, without bleeding                           K26.9, Duodenal ulcer, unspecified as acute or  chronic, without hemorrhage or perforation                           D62, Acute posthemorrhagic anemia CPT copyright 2022 American [employer]. All rights reserved. The codes documented in this report are preliminary and upon coder review may  be revised to meet current compliance requirements. Hennie Duos. Marletta Lor, DO Hennie Duos. Marletta Lor, DO 02/15/2023 1:59:23 PM This report has been signed electronically. Number of Addenda: 0

## 2023-02-15 NOTE — Anesthesia Postprocedure Evaluation (Signed)
Anesthesia Post Note  Patient: POSEY EDDINGER  Procedure(s) Performed: ESOPHAGOGASTRODUODENOSCOPY (EGD) WITH PROPOFOL BIOPSY  Patient location during evaluation: PACU Anesthesia Type: General Level of consciousness: awake and alert Pain management: pain level controlled Vital Signs Assessment: post-procedure vital signs reviewed and stable Respiratory status: spontaneous breathing Cardiovascular status: blood pressure returned to baseline and stable Postop Assessment: no apparent nausea or vomiting Anesthetic complications: no   No notable events documented.   Last Vitals:  Vitals:   02/15/23 1235 02/15/23 1402  BP: 115/67   Pulse: 90   Resp: (!) 21   Temp: 36.4 C (!) 36.2 C  SpO2: 93%     Last Pain:  Vitals:   02/15/23 1345  TempSrc:   PainSc: 0-No pain                 Sumayya Muha

## 2023-02-15 NOTE — Progress Notes (Signed)
PROGRESS NOTE     Leonard Cisneros, is a 82 y.o. male, DOB - 06/24/1941, YQM:[id number]  Admit date - 02/14/2023   Admitting Physician Laquanna Veazey Mariea Clonts, MD  Outpatient Primary MD for the patient is [name], FNP  LOS - 0  Chief Complaint  Patient presents with   abnormal labs        Brief Narrative:  - 82 y.o. male with history of CAD with prior CABG x 3, PAfib, previously on Eliquis  , stopped due to recurrent GI bleed (currently undergoing evaluation for Watchman device), ischemic cardiomyopathy/HFpEF,  HTN,   HLD, moderate to severe aortic stenosis, and history of duodenal ulcer admitted on 02/14/2023 with acute on chronic symptomatic anemia requiring transfusion and CHF exacerbation    -Assessment and Plan: 1)HFpEF/ history of ischemic cardiomyopathy/chronic diastolic dysfunction CHF----Chest x-ray consistent with pulmonary venous congestion pulmonary interstitial edema with right pleural effusion and atelectasis --Has chronic venous stasis dermatitis right leg is weeping --BNP 641 is not too far from baseline Troponin requested and pending -EKG is A-fib without acute findings -Continue IV Lasix -Fluid input and output monitoring -Daily weights   2) acute on chronic anemia--history of GI bleed despite stopping Eliquis Hgb dropping down --No melena, no hematemesis, no bright red blood per rectum -Hgb 7.6 from 6.7 after transfusion of 1 unit of PRBC on 02/14/2023   3)moderate to severe aortic stenosis--May be contributing to #1 above -Be judicious with afterload reduction -Management as above #1   4)PAFib-previously on Eliquis  , stopped due to recurrent GI bleed (currently undergoing evaluation for Watchman device) = Continue metoprolol for rate control   5)CAD-history of prior CABG x 3 vessels--aspirin on hold -Continue metoprolol and atorvastatin   6) hypokalemia hyponatremia/hypochloremia-----in the setting of diuretic use -Replace potassium   7)PUD--history  of duodenal ulcer -March 2024 which was redemonstrated on EGD in April 2024  Repeat EGD on 02/15/2023 shows--mild Schatzki ring,  Gastritis. And  Non-bleeding duodenal ulcer with a clean ulcer base (Forrest Class III). -H. pylori testing is pending -Continue Protonix   Status is: Inpatient   Disposition: The patient is from: Home              Anticipated d/c is to: Home              Anticipated d/c date is: 1 day              Patient currently is not medically stable to d/c. Barriers: Not Clinically Stable-   Code Status :  -  Code Status: Full Code   Family Communication:    NA (patient is alert, awake and coherent)   DVT Prophylaxis  :   - SCDs   SCDs Start: 02/14/23 1821 Place TED hose Start: 02/14/23 1821   Lab Results  Component Value Date   PLT 203 02/15/2023    Inpatient Medications  Scheduled Meds:  sodium chloride   Intravenous Once   atorvastatin  80 mg Oral Daily   furosemide  80 mg Intravenous Q12H   metoprolol succinate  25 mg Oral Daily   pantoprazole (PROTONIX) IV  40 mg Intravenous Q12H   sodium chloride flush  3 mL Intravenous Q12H   sodium chloride flush  3 mL Intravenous Q12H   Continuous Infusions:  sodium chloride     PRN Meds:.sodium chloride, acetaminophen **OR** acetaminophen, bisacodyl, ondansetron **OR** ondansetron (ZOFRAN) IV, polyethylene glycol, sodium chloride flush, traZODone   Anti-infectives (From admission, onward)    None  Subjective: Kathrynn Ducking today has no fevers, no emesis,  No chest pain,   - Dyspnea improving -Oral intake is fair  Objective: Vitals:   02/15/23 1402 02/15/23 1415 02/15/23 1419 02/15/23 1504  BP: (!) 96/54 112/60  108/73  Pulse:  (!) 107 94 78  Resp: 20 20 18 17   Temp: (!) 97.1 F (36.2 C)   97.8 F (36.6 C)  TempSrc:    Oral  SpO2: 90% 100% 100% 98%  Weight:      Height:        Intake/Output Summary (Last 24 hours) at 02/15/2023 1905 Last data filed at 02/15/2023 1700 Gross per 24  hour  Intake 1359.97 ml  Output 1950 ml  Net -590.03 ml   Filed Weights   02/14/23 1500 02/15/23 0500  Weight: 87.6 kg 87.6 kg    Physical Exam  Gen:- Awake Alert, no acute distress HEENT:- [place].AT, No sclera icterus Neck-Supple Neck,No JVD,.  Lungs-no wheezing, fair symmetrical air movement CV- S1, S2 normal, regular ,  prior sternotomy scar, 3/6 SM  Abd-  +ve B.Sounds, Abd Soft, No tenderness,    Extremity/Skin:--+ve pedal edema noted, right leg is weeping, chronic venous stasis on both lower extremities, pedal pulses present  Psych-affect is appropriate, oriented x3 Neuro-generalized weakness, no new focal deficits, no tremors  Data Reviewed: I have personally reviewed following labs and imaging studies  CBC: Recent Labs  Lab 02/12/23 1017 02/13/23 0824 02/14/23 0913 02/15/23 0525  WBC 6.5  --  5.4 5.8  NEUTROABS 4.9  --  3.7  --   HGB 6.7* 7.1* 7.2* 7.6*  HCT 19.8*  --  22.0* 23.4*  MCV 103*  --  113.4* 112.0*  PLT 221  --  227 203   Basic Metabolic Panel: Recent Labs  Lab 02/11/23 1156 02/14/23 0913 02/15/23 0525  NA 138 132* 135  K 4.0 3.4* 3.7  CL 100 97* 100  CO2 26 26 27   GLUCOSE 104* 117* 99  BUN 11 12 11   CREATININE 1.09 1.14 0.96  CALCIUM 8.6 8.1* 8.0*   GFR: Estimated Creatinine Clearance: 64.3 mL/min (by C-G formula based on SCr of 0.96 mg/dL). Liver Function Tests: Recent Labs  Lab 02/13/23 0824 02/14/23 0913  AST  --  42*  ALT  --  31  ALKPHOS 436* 356*  BILITOT  --  3.1*  PROT  --  6.1*  ALBUMIN  --  3.1*   Radiology Studies: DG Chest Portable 1 View  Result Date: 02/14/2023 CLINICAL DATA:  Provided history: Fatigue. EXAM: PORTABLE CHEST 1 VIEW COMPARISON:  Chest radiographs 12/28/2022 and earlier. FINDINGS: Prior median sternotomy/CABG. Cardiomegaly. Aortic atherosclerosis. Central pulmonary vascular congestion. Prominence of the intra lung markings likely reflecting interstitial edema. Moderate right pleural effusion with  underlying right basilar atelectasis and/or airspace consolidation. Small left pleural effusion. Degenerative changes of the spine. IMPRESSION: 1. Cardiomegaly with central pulmonary vascular congestion and pulmonary interstitial edema. 2. Moderate right pleural effusion with underlying right basilar atelectasis and/or airspace consolidation. 3. Small left pleural effusion. 4.  Aortic Atherosclerosis (ICD10-I70.0). Electronically Signed   By: Jackey Loge D.O.   On: 02/14/2023 09:43    Scheduled Meds:  sodium chloride   Intravenous Once   atorvastatin  80 mg Oral Daily   furosemide  80 mg Intravenous Q12H   metoprolol succinate  25 mg Oral Daily   pantoprazole (PROTONIX) IV  40 mg Intravenous Q12H   sodium chloride flush  3 mL Intravenous Q12H   sodium chloride  flush  3 mL Intravenous Q12H   Continuous Infusions:  sodium chloride      LOS: 0 days   [name] M.D on 02/15/2023 at 7:05 PM  Go to [email or web address] - for contact info  [facility] - Office  [phone]  If 7PM-7AM, please contact night-coverage [email or web address] 02/15/2023, 7:05 PM

## 2023-02-15 NOTE — Telephone Encounter (Signed)
Patient seen during hospitalization for acute on chronic anemia. EGD repeated and Dr. Marletta Lor has recommended we update his colonoscopy as soon as possible. He is technically [name] patient but if he has not had any cancellations/availability for ASA 3 for colonoscopy within the next 1-2 weeks then please add to Dr. Queen Blossom schedule within 1-2 weeks. (Dx: anemia). Needs to hold iron for 7 days.    Leonard Cisneros - please make sure patient gets an updated CBC early to mid next week. (6/17-6/20).    Brooke Bonito, MSN, APRN, FNP-BC, AGACNP-BC [facility]

## 2023-02-15 NOTE — Interval H&P Note (Signed)
History and Physical Interval Note:  02/15/2023 1:10 PM  Leonard Cisneros  has presented today for surgery, with the diagnosis of anemia, heme positive stool, history of duodenal ulcer.  The various methods of treatment have been discussed with the patient and family. After consideration of risks, benefits and other options for treatment, the patient has consented to  Procedure(s): ESOPHAGOGASTRODUODENOSCOPY (EGD) WITH PROPOFOL (N/A) as a surgical intervention.  The patient's history has been reviewed, patient examined, no change in status, stable for surgery.  I have reviewed the patient's chart and labs.  Questions were answered to the patient's satisfaction.     Lanelle Bal

## 2023-02-16 ENCOUNTER — Encounter: Payer: Self-pay | Admitting: Family Medicine

## 2023-02-16 DIAGNOSIS — I5033 Acute on chronic diastolic (congestive) heart failure: Secondary | ICD-10-CM | POA: Diagnosis not present

## 2023-02-16 LAB — BASIC METABOLIC PANEL
Anion gap: 8 (ref 5–15)
BUN: 11 mg/dL (ref 8–23)
CO2: 30 mmol/L (ref 22–32)
Calcium: 8 mg/dL — ABNORMAL LOW (ref 8.9–10.3)
Chloride: 98 mmol/L (ref 98–111)
Creatinine, Ser: 0.87 mg/dL (ref 0.61–1.24)
GFR, Estimated: >60 mL/min (ref >60)
Glucose, Bld: 99 mg/dL (ref 70–99)
Potassium: 3 mmol/L — ABNORMAL LOW (ref 3.5–5.1)
Sodium: 136 mmol/L (ref 135–145)

## 2023-02-16 LAB — MAGNESIUM: Magnesium: 1.8 mg/dL (ref 1.7–2.4)

## 2023-02-16 LAB — CBC
HCT: 24 % — ABNORMAL LOW (ref 39.0–52.0)
Hemoglobin: 7.7 g/dL — ABNORMAL LOW (ref 13.0–17.0)
MCH: 36.2 pg — ABNORMAL HIGH (ref 26.0–34.0)
MCHC: 32.1 g/dL (ref 30.0–36.0)
MCV: 112.7 fL — ABNORMAL HIGH (ref 80.0–100.0)
Platelets: 182 10*3/uL (ref 150–400)
RBC: 2.13 MIL/uL — ABNORMAL LOW (ref 4.22–5.81)
RDW: 25.4 % — ABNORMAL HIGH (ref 11.5–15.5)
WBC: 5.8 10*3/uL (ref 4.0–10.5)
nRBC: 0 % (ref 0.0–0.2)

## 2023-02-16 MED ORDER — POTASSIUM CHLORIDE ER 20 MEQ PO TBCR: 20.0000 meq | EXTENDED_RELEASE_TABLET | Freq: Every day | ORAL | 1 refills | Status: DC

## 2023-02-16 MED ORDER — IRON (FERROUS SULFATE) 325 (65 FE) MG PO TABS
325.0000 mg | ORAL_TABLET | Freq: Every day | ORAL | 2 refills | Status: DC
Start: 2023-02-16 — End: 2023-06-24

## 2023-02-16 MED ORDER — ACETAMINOPHEN 325 MG PO TABS: 650.0000 mg | ORAL_TABLET | Freq: Four times a day (QID) | ORAL | 0 refills | Status: DC | PRN

## 2023-02-16 MED ORDER — TORSEMIDE 20 MG PO TABS
40.0000 mg | ORAL_TABLET | Freq: Two times a day (BID) | ORAL | 3 refills | Status: DC
Start: 2023-02-16 — End: 2023-04-14

## 2023-02-16 MED ORDER — POTASSIUM CHLORIDE CRYS ER 20 MEQ PO TBCR
40.0000 meq | EXTENDED_RELEASE_TABLET | ORAL | Status: AC
  Administered 2023-02-16 (×2): 40 meq via ORAL
  Filled 2023-02-16 (×2): qty 2

## 2023-02-16 MED ORDER — METOPROLOL SUCCINATE ER 25 MG PO TB24: 25.0000 mg | ORAL_TABLET | Freq: Every day | ORAL | 2 refills | Status: DC

## 2023-02-16 NOTE — Discharge Summary (Signed)
Leonard Cisneros, is a 82 y.o. male  DOB 1941/07/21  MRN [id number].  Admission date:  02/14/2023  Admitting Physician  [name], MD  Discharge Date:  02/16/2023   Primary MD  [name], FNP  Recommendations for primary care physician for things to follow:  1)Very low-salt diet advised--- less than 2 g of sodium per day advised 2)Weigh yourself daily, call if you gain more than 3 pounds in 1 day or more than 5 pounds in 1 week as your diuretic medications may need to be adjusted 3)Limit your Fluid  intake to no more than 60 ounces (1.8 Liters) per day 4)Your Diuretic--- torsemide/Demadex has been changed to 40 mg twice daily 5)Right Leg wound--Cut piece of Aquacel [place] # [phone]) and apply to right leg wound Q day, then cover with ABD pad and kerlex. 6)Repeat CBC and BMP blood test on Tuesday, 02/19/2023 7)Follow-up Gastroenterologist Dr. Earnest Bailey with [facility]--- in 1 to 2 weeks for outpatient colonoscopy  -address: [address], [place], [place] [address], Phone: [phone]  Admission Diagnosis  Acute exacerbation of CHF (congestive heart failure) (HCC) [I50.9]   Discharge Diagnosis  Acute exacerbation of CHF (congestive heart failure) (HCC) [I50.9]    Principal Problem:   Acute on chronic heart failure with preserved ejection fraction (HFpEF) (HCC) Active Problems:   Symptomatic anemia   Acute exacerbation of CHF (congestive heart failure) (HCC)   Elevated alkaline phosphatase level   Pressure injury of skin      Past Medical History:  Diagnosis Date   Aortic stenosis    Atrial flutter (HCC)    a. s/p remote ablation by Dr. Ladona Ridgel.   CAD (coronary artery disease)    a. s/p CABG 2003. b. 01/2016: unstable angina - s/p BMS to SVG-ramus intermediate, patent LIMA-mLAD, patent RIMA-dRCA.    Chronic lower back pain    Essential hypertension     Hyperlipidemia    Ischemic cardiomyopathy    a. Cath 01/2016 -  EF 50-55% with mild distal inferior hypocontractility.   Peripheral neuropathy 04/04/2014   Permanent atrial fibrillation [facility])     Past Surgical History:  Procedure Laterality Date   BACK SURGERY     BIOPSY  11/21/2022   Procedure: BIOPSY;  Surgeon: Napoleon Form, MD;  Location: [facility] ENDOS[facility];  Service: Gastroenterology;;   CARDIAC CATHETERIZATION  ~ 2000; 2003   CARDIAC CATHETERIZATION N/A 02/01/2016   Procedure: Left Heart Cath and Cors/Grafts Angiography;  Surgeon: Lennette Bihari, MD;  Location: [facility] INVASIVE CV LAB;  Service: Cardiovascular;  Laterality: N/A;   CARDIAC CATHETERIZATION N/A 02/01/2016   Procedure: Coronary Stent Intervention;  Surgeon: Lennette Bihari, MD;  Location: [facility] INVAS[facility] CV LAB;  Service: Cardiovascular;  Laterality: N/A;   CATARACT EXTRACTION W/PHACO Left 02/09/2019   Procedure: CATARACT EXTRACTION PHACO AND INTRAOCULAR LENS PLACEMENT (IOC);  Surgeon: Fabio Pierce, MD;  Location: [facility] ORS;  Service: Ophthalmology;  Laterality: Left;  CDE: 27.70   CATARACT EXTRACTION W/PHACO Right 02/23/2019   Procedure: CATARACT EXTRACTION  PHACO AND INTRAOCULAR LENS PLACEMENT RIGHT EYE;  Surgeon: Fabio Pierce, MD;  Location: [facility] ORS;  Service: Ophthalmology;  Laterality: Right;  CDE: 10.15   COLONOSCOPY  06/04/2002   Normal colon and rectum   COLONOSCOPY  07/08/2012   Procedure: COLONOSCOPY;  Surgeon: Corbin Ade, MD;  Location: [facility] ENDO SU[facility];  Service: Endoscopy;  Laterality: N/A;  11:30   COLONOSCOPY N/A 10/02/2017   Procedure: COLONOSCOPY;  Surgeon: Corbin Ade, MD;  Location: [facility];  Service: Endoscopy;  Laterality: N/A;  10:30am   CORONARY ANGIOPLASTY WITH STENT PLACEMENT  02/01/2016   CORONARY ARTERY BYPASS GRAFT  2003   LIMA to LAD,LIMA to distal RCA,SVG to ramus intermediate vessel.   ESOPHAGOGASTRODUODENOSCOPY (EGD) WITH PROPOFOL N/A 11/21/2022   Procedure: ESOPHAGOGASTRODUODENOSCOPY (EGD) WITH  PROPOFOL;  Surgeon: Napoleon Form, MD;  [facility]: [facility];  Service: Gastroenterology;  Laterality: N/A;   ESOPHAGOGASTRODUODENOSCOPY (EGD) WITH PROPOFOL N/A 12/27/2022   Procedure: ESOPHAGOGASTRODUODENOSCOPY (EGD) WITH PROPOFOL;  Surgeon: Lanelle Bal, DO;  Location: [facility];  Service: Endoscopy;  Laterality: N/A;   FEMORAL REVISION Right 03/30/2019   Procedure: Femoral revision right total hip arthroplasty;  Surgeon: Ollen Gross, MD;  Location: [facility]S;  Service: Orthopedics;  Laterality: Right;    FOREIGN BODY REMOVAL Right 04/02/2014   Procedure: FOREIGN BODY REMOVAL RIGHT FOOT;  Surgeon: Dalia Heading, MD;  Location: [facility];  Service: General;  Laterality: Right;   JOINT REPLACEMENT     JOINT REPLACEMENT     LUMBAR DISC SURGERY     REVISION TOTAL HIP ARTHROPLASTY Bilateral 2005-2012   right-left   RIGHT/LEFT HEART CATH AND CORONARY/GRAFT ANGIOGRAPHY N/A 09/27/2022   Procedure: RIGHT/LEFT HEART CATH AND CORONARY/GRAFT ANGIOGRAPHY;  Surgeon: Swazil[place], Peter M, MD;  [facility]: [facility];  Service: Cardiovascular;  Laterality: N/A;   SHOULDER OPEN ROTATOR CUFF REPAIR Right    TOTAL HIP ARTHROPLASTY Right 1999   TOTAL HIP ARTHROPLASTY Left 2008   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     HPI  from the history and physical done on the day of admission:    Leonard Cisneros  is a 82 y.o. male with history of CAD with prior CABG x 3, PAfib, previously on Eliquis  , stopped due to recurrent GI bleed (currently undergoing evaluation for Watchman device), ischemic cardiomyopathy/HFpEF,  HTN,   HLD, moderate to severe aortic stenosis, and history of duodenal ulcer in March 2024 which was redemonstrated on EGD in April 2024 who was sent to ED by PCP initially on 02/13/2023 for  possible transfusion due to hemoglobin of 6.7 with dyspnea -Patient left the ED due to long wait times =-Returns on 02/14/2023 with complaints of dyspnea on exertion -Repeat hemoglobin 7.2 without transfusion -No melena, no hematemesis, no bright red blood per rectum -Denies frank chest pains or palpitations or dizziness -Has chronic venous stasis dermatitis right leg is weeping No fever  Or chills    No Nausea, Vomiting or Diarrhea -Chest x-ray consistent with pulmonary venous congestion pulmonary interstitial edema with right pleural effusion and atelectasis -Potassium is 3.4, sodium 132, chloride 97, creatinine 1.1 -BNP 641 is not too far from baseline Troponin requested and pending -EKG is A-fib without acute findings   Hospital Course:   Brief Narrative:  - 82 y.o. male with history of CAD  with prior CABG x 3, PAfib, previously on Eliquis  , stopped due to recurrent GI bleed (currently undergoing evaluation for Watchman device), ischemic cardiomyopathy/HFpEF,  HTN,   HLD, moderate to severe aortic stenosis, and history of duodenal ulcer admitted on 02/14/2023 with acute on chronic symptomatic anemia requiring transfusion and CHF exacerbation     -Assessment and Plan: 1)HFpEF/ history of ischemic cardiomyopathy/chronic diastolic dysfunction CHF----Chest x-ray consistent with pulmonary venous congestion pulmonary interstitial edema with right pleural effusion and atelectasis --Has chronic venous stasis dermatitis right leg is weeping --BNP 641 is not too far from baseline Troponin 22 >>20 -EKG is A-fib without acute findings -Weight is down to 186 pounds from 193 pounds after IV diuresis with Lasix -Discharged home on torsemide 40 mg twice daily up from 60 mg daily prior to admission  2) acute on chronic anemia--history of GI bleed despite stopping Eliquis Hgb dropping down --No melena, no hematemesis, no bright red blood per rectum -Hgb 7.7 from 6.7 after transfusion of 1 unit of  PRBC on 02/14/2023 - -Follow-up with gastroenterology for colonoscopy as outpatient over the next week or 2   3)moderate to severe aortic stenosis--May be contributing to #1 above -Be judicious with afterload reduction -Management as above #1 -Follow-up as outpatient with cardiologist--Dr. Diona Browner within a week   4)PAFib-previously on Eliquis  , stopped due to recurrent GI bleed (currently undergoing evaluation for Watchman device) = Continue metoprolol for rate control   5)CAD-history of prior CABG x 3 vessels--aspirin on hold -Continue metoprolol and atorvastatin   6) hypokalemia hyponatremia/hypochloremia-----in the setting of diuretic use -Replaced potassium -Discharge on potassium supplementation while on diuretics   7)PUD--history of duodenal ulcer -March 2024 which was redemonstrated on EGD in April 2024  Repeat EGD on 02/15/2023 shows--mild Schatzki ring,  Gastritis. And  Non-bleeding duodenal ulcer with a clean ulcer base (Forrest Class III). -H. pylori testing is pending -Continue Protonix   Disposition: The patient is from: Home              Anticipated d/c is to: Home  Discharge Condition: stable  Follow UP   Follow-up Information     [name], MD. Schedule an appointment as soon as possible for a visit in 1 week(s).   Specialty: Cardiology Contact information: [address] [place] [address] [phone]         [name], FNP. Schedule an appointment as soon as possible for a visit on 02/19/2023.   Specialty: Family Medicine Why: Repeat CBC and BMP -- Contact information: [address] [place] [address] [phone]                  Consults obtained - Gi  Diet and Activity recommendation:  As advised  Discharge Instructions    Discharge Instructions     Call MD for:  difficulty breathing, headache or visual disturbances   Complete by: As directed    Call MD for:  persistant nausea and vomiting    Complete by: As directed    Call MD for:  temperature >100.4   Complete by: As directed    Diet - low sodium heart healthy   Complete by: As directed    Discharge instructions   Complete by: As directed    1)Very low-salt diet advised--- less than 2 g of sodium per day advised 2)Weigh yourself daily, call if you gain more than 3 pounds in 1 day or more than 5 pounds in 1 week as  your diuretic medications may need to be adjusted 3)Limit your Fluid  intake to no more than 60 ounces (1.8 Liters) per day 4)Your Diuretic--- torsemide/Demadex has been changed to 40 mg twice daily 5)Right Leg wound--Cut piece of Aquacel [place] # [phone]) and apply to right leg wound Q day, then cover with ABD pad and kerlex. 6)Repeat CBC and BMP blood test on Tuesday, 02/19/2023 7)Follow-up Gastroenterologist Dr. Earnest Bailey with [facility]--- in 1 to 2 weeks for outpatient colonoscopy  -address: [address], [place], [place] [address], Phone: [phone]   Discharge wound care:   Complete by: As directed    Cut piece of Aquacel [place] # [phone]) and apply to right leg wound Q day, then cover with ABD pad and kerlex.   Increase activity slowly   Complete by: As directed          Discharge Medications     Allergies as of 02/16/2023       Reactions   Augmentin [amoxicillin-pot Clavulanate] Nausea And Vomiting, Other (See Comments)   Has patient had a PCN reaction causing immediate rash, facial/tongue/throat swelling, SOB or lightheadedness with hypotension: Unknown Has patient had a PCN reaction causing severe rash involving mucus membranes or skin necrosis: No Has patient had a PCN reaction that required hospitalization: No Has patient had a PCN reaction occurring within the last 10 years: No If all of the above answers are "NO", then may proceed with Cephalosporin use.        Medication List     STOP taking these medications    potassium chloride 10 MEQ  tablet Commonly known as: Klor-Con M10       TAKE these medications    acetaminophen 325 MG tablet Commonly known as: TYLENOL Take 2 tablets (650 mg total) by mouth every 6 (six) hours as needed for mild pain (or Fever >/= 101).   atorvastatin 80 MG tablet Commonly known as: LIPITOR TAKE 1 TABLET BY MOUTH EVERY DAY   Iron (Ferrous Sulfate) 325 (65 Fe) MG Tabs Take 325 mg by mouth daily.   metoprolol succinate 25 MG 24 hr tablet Commonly known as: TOPROL-XL Take 1 tablet (25 mg total) by mouth daily.   nitroGLYCERIN 0.4 MG SL tablet Commonly known as: NITROSTAT Place 1 tablet (0.4 mg total) under the tongue every 5 (five) minutes x 3 doses as needed for chest pain (if no relief after 3rd dose, proceed to the ED for an evaluation or call 911).   pantoprazole 40 MG tablet Commonly known as: Protonix Take 1 tablet (40 mg total) by mouth 2 (two) times daily.   Potassium Chloride ER 20 MEQ Tbcr Take 1 tablet (20 mEq total) by mouth daily. 1 tab daily by mouth   silver sulfADIAZINE 1 % cream Commonly known as: SILVADENE Apply 1 Application topically daily.   torsemide 20 MG tablet Commonly known as: DEMADEX Take 2 tablets (40 mg total) by mouth 2 (two) times daily. What changed:  how much to take when to take this               Discharge Care Instructions  (From admission, onward)           Start     Ordered   02/16/23 0000  Discharge wound care:       Comments: Cut piece of Aquacel [place] # [phone]) and apply to right leg wound Q day, then cover with ABD pad and kerlex.   02/16/23 1249  Major procedures and Radiology Reports - PLEASE review detailed and final reports for all details, in brief -   DG Chest Portable 1 View  Result Date: 02/14/2023 CLINICAL DATA:  Provided history: Fatigue. EXAM: PORTABLE CHEST 1 VIEW COMPARISON:  Chest radiographs 12/28/2022 and earlier. FINDINGS: Prior median sternotomy/CABG. Cardiomegaly. Aortic  atherosclerosis. Central pulmonary vascular congestion. Prominence of the intra lung markings likely reflecting interstitial edema. Moderate right pleural effusion with underlying right basilar atelectasis and/or airspace consolidation. Small left pleural effusion. Degenerative changes of the spine. IMPRESSION: 1. Cardiomegaly with central pulmonary vascular congestion and pulmonary interstitial edema. 2. Moderate right pleural effusion with underlying right basilar atelectasis and/or airspace consolidation. 3. Small left pleural effusion. 4.  Aortic Atherosclerosis (ICD10-I70.0). Electronically Signed   By: Jackey Loge D.O.   On: 02/14/2023 09:43   Today   Subjective    Kathrynn Ducking today has no new complaints - Patient states I feel really good I want to go home -Walking with crutches around the room -Denies chest pains or dyspnea on exertion -Voiding well        No fever  Or chills  -Post ambulation O2 sats 95 to 97% on room air   Patient has been seen and examined prior to discharge   Objective   Blood pressure (!) 101/58, pulse 91, temperature 97.6 F (36.4 C), temperature source Oral, resp. rate 20, height 5\' 11"  (1.803 m), weight 84.4 kg, SpO2 96 %.   Intake/Output Summary (Last 24 hours) at 02/16/2023 1330 Last data filed at 02/16/2023 0414 Gross per 24 hour  Intake 709.97 ml  Output 475 ml  Net 234.97 ml    Exam Gen:- Awake Alert, no acute distress , no significant dyspnea on exertion at this time HEENT:- [place].AT, No sclera icterus Neck-Supple Neck,No JVD,.  Lungs-  CTAB , good air movement bilaterally CV- S1, S2 normal, regular, prior sternotomy scar, 3/6 SM  Abd-  +ve B.Sounds, Abd Soft, No tenderness,    Extremity/Skin:-Improved pedal edema noted, chronic venous stasis on both lower extremities, pedal pulses present , right leg open wound with dressing on Psych-affect is appropriate, oriented x3 Neuro- no new focal deficits, no tremors    Data Review   CBC w  Diff:  Lab Results  Component Value Date   WBC 5.8 02/16/2023   HGB 7.7 (L) 02/16/2023   HGB 7.1 (L) 02/13/2023   HCT 24.0 (L) 02/16/2023   HCT 19.8 (L) 02/12/2023   PLT 182 02/16/2023   PLT 221 02/12/2023   LYMPHOPCT 14 02/14/2023   MONOPCT 11 02/14/2023   EOSPCT 3 02/14/2023   BASOPCT 1 02/14/2023   CMP:  Lab Results  Component Value Date   NA 136 02/16/2023   NA 138 02/11/2023   K 3.0 (L) 02/16/2023   CL 98 02/16/2023   CO2 30 02/16/2023   BUN 11 02/16/2023   BUN 11 02/11/2023   CREATININE 0.87 02/16/2023   CREATININE 1.01 01/10/2023   PROT 6.1 (L) 02/14/2023   PROT 6.3 11/29/2022   ALBUMIN 3.1 (L) 02/14/2023   ALBUMIN 3.7 11/29/2022   BILITOT 3.1 (H) 02/14/2023   BILITOT 2.2 (H) 11/29/2022   ALKPHOS 356 (H) 02/14/2023   AST 42 (H) 02/14/2023   ALT 31 02/14/2023   Total Discharge time is about 33 minutes  [name] M.D on 02/16/2023 at 1:30 PM  Go to [email or web address] -  for contact info  [facility] - Office  [phone]

## 2023-02-16 NOTE — TOC Transition Note (Signed)
Transition of Care [facility]) - CM/SW Discharge Note   Patient Details  Name: Leonard Cisneros MRN: [id number] Date of Birth: [birth year]  Transition of Care [facility]) CM/SW Contact:  Catalina Gravel, LCSW Phone Number: 02/16/2023, 8:38 AM   Clinical Narrative:    CSW verified pt 02 status confirming on record and with RN via secure chat that pt doing fine on room air.  No TOC need to arrange 02.  PT DC order signed. No further TOC needs.      Barriers to Discharge: No Barriers Identified   Patient Goals and CMS Choice      Discharge Placement                         Discharge Plan and Services Additional resources added to the After Visit Summary for                                       Social Determinants of Health (SDOH) Interventions SDOH Screenings   Food Insecurity: No Food Insecurity (02/14/2023)  Housing: Low Risk  (02/14/2023)  Transportation Needs: No Transportation Needs (02/14/2023)  Utilities: Not At Risk (02/14/2023)  Alcohol Screen: Low Risk  (11/25/2022)  Depression (PHQ2-9): Low Risk  (02/12/2023)  Financial Resource Strain: Low Risk  (01/04/2023)  Physical Activity: Unknown (11/25/2022)  Social Connections: Moderately Integrated (11/25/2022)  Stress: No Stress Concern Present (01/04/2023)  Tobacco Use: Medium Risk (02/15/2023)     Readmission Risk Interventions    12/26/2022   10:54 AM 09/25/2022   12:42 PM  Readmission Risk Prevention Plan  Transportation Screening Complete Complete  PCP or Specialist Appt within 5-7 Days  Not Complete  Home Care Screening  Complete  Medication Review (RN CM)  Complete  HRI or Home Care Consult Complete   Social Work Consult for Recovery Care Planning/Counseling Complete   Palliative Care Screening Not Applicable   Medication Review Oceanographer) Complete

## 2023-02-16 NOTE — Progress Notes (Signed)
Nsg Discharge Note  Admit Date:  02/14/2023 Discharge date: 02/16/2023   Leonard Cisneros to be D/C'd Home per MD order.  AVS completed.   Patient/caregiver able to verbalize understanding.  Discharge Medication: Allergies as of 02/16/2023       Reactions   Augmentin [amoxicillin-pot Clavulanate] Nausea And Vomiting, Other (See Comments)   Has patient had a PCN reaction causing immediate rash, facial/tongue/throat swelling, SOB or lightheadedness with hypotension: Unknown Has patient had a PCN reaction causing severe rash involving mucus membranes or skin necrosis: No Has patient had a PCN reaction that required hospitalization: No Has patient had a PCN reaction occurring within the last 10 years: No If all of the above answers are "NO", then may proceed with Cephalosporin use.        Medication List     STOP taking these medications    potassium chloride 10 MEQ tablet Commonly known as: Klor-Con M10       TAKE these medications    acetaminophen 325 MG tablet Commonly known as: TYLENOL Take 2 tablets (650 mg total) by mouth every 6 (six) hours as needed for mild pain (or Fever >/= 101).   atorvastatin 80 MG tablet Commonly known as: LIPITOR TAKE 1 TABLET BY MOUTH EVERY DAY   Iron (Ferrous Sulfate) 325 (65 Fe) MG Tabs Take 325 mg by mouth daily.   metoprolol succinate 25 MG 24 hr tablet Commonly known as: TOPROL-XL Take 1 tablet (25 mg total) by mouth daily.   nitroGLYCERIN 0.4 MG SL tablet Commonly known as: NITROSTAT Place 1 tablet (0.4 mg total) under the tongue every 5 (five) minutes x 3 doses as needed for chest pain (if no relief after 3rd dose, proceed to the ED for an evaluation or call 911).   pantoprazole 40 MG tablet Commonly known as: Protonix Take 1 tablet (40 mg total) by mouth 2 (two) times daily.   Potassium Chloride ER 20 MEQ Tbcr Take 1 tablet (20 mEq total) by mouth daily. 1 tab daily by mouth   silver sulfADIAZINE 1 % cream Commonly known  as: SILVADENE Apply 1 Application topically daily.   torsemide 20 MG tablet Commonly known as: DEMADEX Take 2 tablets (40 mg total) by mouth 2 (two) times daily. What changed:  how much to take when to take this               Discharge Care Instructions  (From admission, onward)           Start     Ordered   02/16/23 0000  Discharge wound care:       Comments: Cut piece of Aquacel [place] # [phone]) and apply to right leg wound Q day, then cover with ABD pad and kerlex.   02/16/23 1249            Discharge Assessment: Vitals:   02/16/23 0808 02/16/23 1200  BP: (!) 88/56 (!) 101/58  Pulse: 90 91  Resp:    Temp:    SpO2: 96%    Skin clean, dry and intact without evidence of skin break down, no evidence of skin tears noted. IV catheter discontinued intact. Site without signs and symptoms of complications - no redness or edema noted at insertion site, patient denies c/o pain - only slight tenderness at site.  Dressing with slight pressure applied.  D/c Instructions-Education: Discharge instructions given to patient/family with verbalized understanding. D/c education completed with patient/family including follow up instructions, medication list, d/c activities limitations if indicated,  with other d/c instructions as indicated by MD - patient able to verbalize understanding, all questions fully answered. Patient instructed to return to ED, call 911, or call MD for any changes in condition.  Patient escorted via WC, and D/C home via private auto.  Laurena Spies, RN 02/16/2023 12:59 PM

## 2023-02-16 NOTE — Discharge Instructions (Addendum)
1)Very low-salt diet advised--- less than 2 g of sodium per day advised 2)Weigh yourself daily, call if you gain more than 3 pounds in 1 day or more than 5 pounds in 1 week as your diuretic medications may need to be adjusted 3)Limit your Fluid  intake to no more than 60 ounces (1.8 Liters) per day 4)Your Diuretic--- torsemide/Demadex has been changed to 40 mg twice daily 5)Right Leg wound--Cut piece of Aquacel [place] # [phone]) and apply to right leg wound Q day, then cover with ABD pad and kerlex. 6)Repeat CBC and BMP blood test on Tuesday, 02/19/2023 7)Follow-up Gastroenterologist Dr. Earnest Bailey with [facility]--- in 1 to 2 weeks for outpatient colonoscopy  -address: [address] [place], [place] [address], Phone: [phone]

## 2023-02-18 ENCOUNTER — Other Ambulatory Visit (HOSPITAL_COMMUNITY): Payer: Self-pay

## 2023-02-18 ENCOUNTER — Encounter: Payer: Self-pay | Admitting: *Deleted

## 2023-02-18 ENCOUNTER — Other Ambulatory Visit: Payer: Self-pay | Admitting: *Deleted

## 2023-02-18 ENCOUNTER — Ambulatory Visit: Payer: Self-pay | Admitting: *Deleted

## 2023-02-18 ENCOUNTER — Ambulatory Visit (HOSPITAL_COMMUNITY): Payer: Medicare HMO

## 2023-02-18 DIAGNOSIS — D649 Anemia, unspecified: Secondary | ICD-10-CM

## 2023-02-18 DIAGNOSIS — D509 Iron deficiency anemia, unspecified: Secondary | ICD-10-CM

## 2023-02-18 LAB — ALKALINE PHOSPHATASE, ISOENZYMES
BONE FRACTION: 20 % (ref 12–68)
LIVER FRACTION: 80 % (ref 13–88)

## 2023-02-18 LAB — MITOCHONDRIAL ANTIBODIES: Mitochondrial Ab: <20 Units (ref 0.0–20.0)

## 2023-02-18 LAB — HEMOGLOBIN: Hemoglobin: 7.1 g/dL — ABNORMAL LOW (ref 13.0–17.7)

## 2023-02-18 MED ORDER — PEG 3350-KCL-NA BICARB-NACL 420 G PO SOLR: 4000.0000 mL | Freq: Once | ORAL | 0 refills | Status: AC

## 2023-02-18 NOTE — Patient Outreach (Signed)
  Care Coordination   Follow Up Visit Note   02/18/2023 Name: Leonard Cisneros MRN: [id number] DOB: April 19, 1941  Leonard Cisneros is a 82 y.o. year old male who sees [name], FNP for primary care. I spoke with Leonard Cisneros, wife &  Leonard Cisneros by phone today.  What matters to the patients health and wellness today?  Leonard Cisneros is outside on his porch. He voices he is doing well. He denies any abdominal symptoms.  He encourages RN CM to outreach to his wife, Leonard Cisneros.    Leonard Cisneros confirms an Endoscopy was completed and patient is being scheduling for a colonoscopy on 03/04/23. These tests are being done after Leonard Cisneros had a positive Heme test History of ulcer (present) & polyps (past) Not aware of any family anemia traits  Leonard Cisneros voiced understanding of all labs reviewed, changes in diet, pending colonoscopy to find source of any bleeding Voiced appreciation for the outreach    Goals Addressed             This Visit's Progress    THn care coordination services   On track    Interventions Today    Flowsheet Row Most Recent Value  Chronic Disease   Chronic disease during today's visit Other  [endoscopy, colonoscopy, labs, anemia, iron]  General Interventions   General Interventions Discussed/Reviewed General Interventions Reviewed, Labs, Doctor Visits  Labs --  [ferritin, total iron binding]  Doctor Visits Discussed/Reviewed Doctor Visits Reviewed, PCP, Specialist  PCP/Specialist Visits Compliance with follow-up visit  Exercise Interventions   Exercise Discussed/Reviewed Exercise Reviewed, Physical Activity  Physical Activity Discussed/Reviewed Physical Activity Reviewed  Education Interventions   Education Provided Provided Education, Provided Web-based Education  Provided Verbal Education On Labs, Medication, When to see the doctor  Mental Health Interventions   Mental Health Discussed/Reviewed Mental Health Reviewed, Coping Strategies  Nutrition Interventions    Nutrition Discussed/Reviewed Nutrition Reviewed  [iron]  Pharmacy Interventions   Pharmacy Dicussed/Reviewed Pharmacy Topics Reviewed, Medications and their functions              SDOH assessments and interventions completed:  No     Care Coordination Interventions:  Yes, provided   Follow up plan:  03/15/23    Encounter Outcome:  Pt. Visit Completed   Leonard Ozga L. Noelle Penner, RN, BSN, CCM [facility] Care Management Community Coordinator Office number [phone]

## 2023-02-18 NOTE — Telephone Encounter (Signed)
Pt scheduled for TCS WITH Dr. Marletta Lor 7/1. Aware will send rx prep to [name]. Instructions will be sent via mychart with pre-op appt. Aware when to stop iron for TCS.

## 2023-02-18 NOTE — Patient Instructions (Addendum)
Visit Information  Thank you for taking time to visit with me today. Please don't hesitate to contact me if I can be of assistance to you.   Following are the goals we discussed today:   Goals Addressed             This Visit's Progress    THn care coordination services   On track    Interventions Today    Flowsheet Row Most Recent Value  Chronic Disease   Chronic disease during today's visit Other  [endoscopy, colonoscopy, labs, anemia, iron]  General Interventions   General Interventions Discussed/Reviewed General Interventions Reviewed, Labs, Doctor Visits  Labs --  [ferritin, total iron binding]  Doctor Visits Discussed/Reviewed Doctor Visits Reviewed, PCP, Specialist  PCP/Specialist Visits Compliance with follow-up visit  Exercise Interventions   Exercise Discussed/Reviewed Exercise Reviewed, Physical Activity  Physical Activity Discussed/Reviewed Physical Activity Reviewed  Education Interventions   Education Provided Provided Web-based Education, Provided Education  [Web based education via after visit summary for iron rich diet, iron level & total iron binding, ferritin test, colonoscopy, hemoglobin, hematocrit]  Provided Verbal Education On Labs, Medication, When to see the doctor  Mental Health Interventions   Mental Health Discussed/Reviewed Mental Health Reviewed, Coping Strategies  Nutrition Interventions   Nutrition Discussed/Reviewed Nutrition Reviewed  [iron]  Pharmacy Interventions   Pharmacy Dicussed/Reviewed Pharmacy Topics Reviewed, Medications and their functions              Our next appointment is by telephone on 03/15/23 at 1 pm  Please call the care guide team at [phone] if you need to cancel or reschedule your appointment.   If you are experiencing a Mental Health or Behavioral Health Crisis or need someone to talk to, please call the Suicide and Crisis Lifeline: 988 call the [place] National Suicide Prevention Lifeline: 270-137-7236 or  TTY: 647-595-3022 TTY 973-697-3769) to talk to a trained counselor call 1-800-273-TALK (toll free, 24 hour hotline) call the [facility]: (671)186-1533 call 911   Patient verbalizes understanding of instructions and care plan provided today and agrees to view in MyChart. Active MyChart status and patient understanding of how to access instructions and care plan via MyChart confirmed with patient.     The patient has been provided with contact information for the care management team and has been advised to call with any health related questions or concerns.   Evora Schechter L. Noelle Penner, RN, BSN, CCM [facility] Care Management Community Coordinator Office number [phone]

## 2023-02-18 NOTE — Telephone Encounter (Signed)
Spoke to pt, informed him to have labs done this week. Pt voiced understanding. Labs entered into Epic.

## 2023-02-18 NOTE — Telephone Encounter (Signed)
Appt scheduled pt aware  

## 2023-02-19 DIAGNOSIS — D509 Iron deficiency anemia, unspecified: Secondary | ICD-10-CM | POA: Diagnosis not present

## 2023-02-19 DIAGNOSIS — D649 Anemia, unspecified: Secondary | ICD-10-CM | POA: Diagnosis not present

## 2023-02-19 LAB — SURGICAL PATHOLOGY

## 2023-02-20 ENCOUNTER — Other Ambulatory Visit: Payer: Self-pay | Admitting: *Deleted

## 2023-02-20 ENCOUNTER — Other Ambulatory Visit (HOSPITAL_COMMUNITY): Payer: Self-pay

## 2023-02-20 ENCOUNTER — Encounter: Payer: Self-pay | Admitting: *Deleted

## 2023-02-20 DIAGNOSIS — D649 Anemia, unspecified: Secondary | ICD-10-CM

## 2023-02-20 LAB — CBC WITH DIFFERENTIAL/PLATELET
Basophils Absolute: 0 10*3/uL (ref 0.0–0.2)
Basos: 1 %
EOS (ABSOLUTE): 0.3 10*3/uL (ref 0.0–0.4)
Eos: 6 %
Hematocrit: 22.7 % — ABNORMAL LOW (ref 37.5–51.0)
Hemoglobin: 7.8 g/dL — ABNORMAL LOW (ref 13.0–17.7)
Immature Grans (Abs): 0 10*3/uL (ref 0.0–0.1)
Immature Granulocytes: 1 %
Lymphocytes Absolute: 1 10*3/uL (ref 0.7–3.1)
Lymphs: 19 %
MCH: 35.1 pg — ABNORMAL HIGH (ref 26.6–33.0)
MCHC: 34.4 g/dL (ref 31.5–35.7)
MCV: 102 fL — ABNORMAL HIGH (ref 79–97)
Monocytes Absolute: 0.7 10*3/uL (ref 0.1–0.9)
Monocytes: 14 %
Neutrophils Absolute: 3.2 10*3/uL (ref 1.4–7.0)
Neutrophils: 59 %
Platelets: 194 10*3/uL (ref 150–450)
RBC: 2.22 x10E6/uL — CL (ref 4.14–5.80)
RDW: 20.2 % — ABNORMAL HIGH (ref 11.6–15.4)
WBC: 5.3 10*3/uL (ref 3.4–10.8)

## 2023-02-21 ENCOUNTER — Encounter (HOSPITAL_COMMUNITY): Payer: Self-pay | Admitting: Internal Medicine

## 2023-02-21 ENCOUNTER — Other Ambulatory Visit (HOSPITAL_COMMUNITY): Payer: Self-pay

## 2023-02-22 ENCOUNTER — Other Ambulatory Visit (HOSPITAL_COMMUNITY): Payer: Self-pay

## 2023-02-22 ENCOUNTER — Other Ambulatory Visit (HOSPITAL_COMMUNITY): Payer: Self-pay | Admitting: Student

## 2023-02-24 NOTE — Progress Notes (Unsigned)
Established Patient Office Visit  Subjective:  Patient ID: Leonard Cisneros, male    DOB: 1941/02/07  Age: 82 y.o. MRN: [id number]  CC: No chief complaint on file.   HPI Leonard Cisneros is a 82 y.o. male with past medical history of duodenal ulcer, recurrent GI bleed, CAD with prior CABG X3, previously on Eliquis, stopped due to recurrent GI bleed,  presents for ED f/u.   Symptomatic anemia: The patient was admitted on 02/14/2023 with acute on chronic symptomatic anemia requiring 1 unit of PRBC transfusion for a hemoglobin of 6.7 and congestive heart failure exacerbation. Chest x-ray was consistent with pulmonary venous congestion pulmonary interstitial edema with right pleural effusion and atelectasis. The patient was discharged home with torsemide 40 mg twice daily and encouraged to follow up with GI for a colonoscopy. Since discharged, the patient denies melena, hematemesis and bright red blood from the rectum, chest pain, palpitations and dizziness. No fever or chills reported.   Past Medical History:  Diagnosis Date   Aortic stenosis    Atrial flutter (HCC)    a. s/p remote ablation by Dr. Ladona Ridgel.   CAD (coronary artery disease)    a. s/p CABG 2003. b. 01/2016: unstable angina - s/p BMS to SVG-ramus intermediate, patent LIMA-mLAD, patent RIMA-dRCA.    Chronic lower back pain    Essential hypertension    Hyperlipidemia    Ischemic cardiomyopathy    a. Cath 01/2016 -  EF 50-55% with mild distal inferior hypocontractility.   Peripheral neuropathy 04/04/2014   Permanent atrial fibrillation [facility])     Past Surgical History:  Procedure Laterality Date   BACK SURGERY     BIOPSY  11/21/2022   Procedure: BIOPSY;  Surgeon: Napoleon Form, MD;  Location: [facility] ENDOSCOPY;  Service: Gastroenterology;;   BIOPSY  02/15/2023   Procedure: BIOPSY;  Surgeon: Lanelle Bal, DO;  Location: [facility] ENDO SUITE;  Service: Endoscopy;;   CARDIAC CATHETERIZATION  ~ 2000; 2003   CARDIAC CATHETERIZATION  N/A 02/01/2016   Procedure: Left Heart Cath and Cors/Grafts Angiography;  Surgeon: Lennette Bihari, MD;  Location: [facility] INVASIVE CV LAB;  Service: Cardiovascular;  Laterality: N/A;   CARDIAC CATHETERIZATION N/A 02/01/2016   Procedure: Coronary Stent Intervention;  Surgeon: Lennette Bihari, MD;  Location: [facility] INVASIVE CV LAB;  Service: Cardiovascular;  Laterality: N/A;   CATARACT EXTRACTION W/PHACO Left 02/09/2019   Procedure: CATARACT EXTRACTION PHACO AND INTRAOCULAR LENS PLACEMENT (IOC);  Surgeon: Fabio Pierce, MD;  Location: [facility] ORS;  Service: Ophthalmology;  Laterality: Left;  CDE: 27.70   CATARACT EXTRACTION W/PHACO Right 02/23/2019   Procedure: CATARACT EXTRACTION PHACO AND INTRAOCULAR LENS PLACEMENT RIGHT EYE;  Surgeon: Fabio Pierce, MD;  Location: [facility] ORS;  Service: Ophthalmology;  Laterality: Right;  CDE: 10.15   COLONOSCOPY  06/04/2002   Normal colon and rectum   COLONOSCOPY  07/08/2012   Procedure: COLONOSCOPY;  Surgeon: Corbin Ade, MD;  Location: [facility] ENDO SUITE;  Service: Endoscopy;  Laterality: N/A;  11:30   COLONOSCOPY N/A 10/02/2017   Procedure: COLONOSCOPY;  Surgeon: Corbin Ade, MD;  Location: [facility] ENDO SUITE;  Service: Endoscopy;  Laterality: N/A;  10:30am   CORONARY ANGIOPLASTY WITH STENT PLACEMENT  02/01/2016   CORONARY ARTERY BYPASS GRAFT  2003   LIMA to LAD,LIMA to distal RCA,SVG to ramus intermediate vessel.   ESOPHAGOGASTRODUODENOSCOPY (EGD) WITH PROPOFOL N/A 11/21/2022   Procedure: ESOPHAGOGASTRODUODENOSCOPY (EGD) WITH PROPOFOL;  Surgeon: Napoleon Form, MD;  Location: [facility] ENDOSCOPY;  Service: Gastroenterology;  Laterality: N/A;   ESOPHAGOGASTRODUODENOSCOPY (EGD) WITH PROPOFOL N/A 12/27/2022   Procedure: ESOPHAGOGASTRODUODENOSCOPY (EGD) WITH PROPOFOL;  Surgeon: Lanelle Bal, DO;  Location: [facility] ENDO SUITE;  Service: Endoscopy;  Laterality: N/A;   ESOPHAGOGASTRODUODENOSCOPY (EGD) WITH PROPOFOL N/A 02/15/2023   Procedure: ESOPHAGOGASTRODUODENOSCOPY (EGD) WITH PROPOFOL;   Surgeon: Lanelle Bal, DO;  Location: [facility] ENDO SUITE;  Service: Endoscopy;  Laterality: N/A;   FEMORAL REVISION Right 03/30/2019   Procedure: Femoral revision right total hip arthroplasty;  Surgeon: Ollen Gross, MD;  Location: [facility] ORS;  Service: Orthopedics;  Laterality: Right;    FOREIGN BODY REMOVAL Right 04/02/2014   Procedure: FOREIGN BODY REMOVAL RIGHT FOOT;  Surgeon: Dalia Heading, MD;  Location: [facility] ORS;  Service: General;  Laterality: Right;   JOINT REPLACEMENT     JOINT REPLACEMENT     LUMBAR DISC SURGERY     REVISION TOTAL HIP ARTHROPLASTY Bilateral 2005-2012   right-left   RIGHT/LEFT HEART CATH AND CORONARY/GRAFT ANGIOGRAPHY N/A 09/27/2022   Procedure: RIGHT/LEFT HEART CATH AND CORONARY/GRAFT ANGIOGRAPHY;  Surgeon: [place], Peter M, MD;  Location: [facility] INVASIVE CV LAB;  Service: Cardiovascular;  Laterality: N/A;   SHOULDER OPEN ROTATOR CUFF REPAIR Right    TOTAL HIP ARTHROPLASTY Right 1999   TOTAL HIP ARTHROPLASTY Left 2008   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    Family History  Problem Relation Age of Onset   Heart attack Brother    Colon cancer Neg Hx     Social History   Socioeconomic History   Marital status: Married    Spouse name: Leonard Cisneros   Number of children: 2   Years of education: Not on file   Highest education level: Bachelor's degree (e.g., BA, AB, BS)  Occupational History   Occupation: PT Holiday representative: RETIRED  Tobacco Use   Smoking status: Former    Packs/day: 0.50    Years: 2.00    Additional pack years: 0.00    Total pack years: 1.00    Types: Cigarettes    Quit date: 09/03/1966    Years since quitting: 56.5   Smokeless tobacco: Never  Vaping Use   Vaping Use: Never used  Substance and Sexual Activity   Alcohol use: Yes     Alcohol/week: 11.0 standard drinks of alcohol    Types: 7 Glasses of wine, 4 Cans of beer per week    Comment: a glass of wine with dinner/ beer on the weekend   Drug use: No   Sexual activity: Not on file  Other Topics Concern   Not on file  Social History Narrative   Lives with his wife.   Social Determinants of Health   Financial Resource Strain: Low Risk  (01/04/2023)   Overall Financial Resource Strain (CARDIA)    Difficulty of Paying Living Expenses: Not hard at all  Food Insecurity: No Food Insecurity (02/14/2023)   Hunger Vital Sign    Worried About Running Out of Food in the Last Year: Never true    Ran Out of Food in the Last Year: Never true  Transportation Needs: No Transportation Needs (02/14/2023)   PRAPARE - Administrator, Civil Service (Medical): No    Lack of Transportation (Non-Medical): No  Physical Activity: Unknown (11/25/2022)   Exercise Vital Sign    Days of Exercise  per Week: 0 days    Minutes of Exercise per Session: Not on file  Stress: No Stress Concern Present (01/04/2023)   [employer] of Occupational Health - Occupational Stress Questionnaire    Feeling of Stress : Not at all  Social Connections: Moderately Integrated (11/25/2022)   Social Connection and Isolation Panel [NHANES]    Frequency of Communication with Friends and Family: Once a week    Frequency of Social Gatherings with Friends and Family: Once a week    Attends Religious Services: More than 4 times per year    Active Member of [employer] or Organizations: Yes    Attends Engineer, structural: More than 4 times per year    Marital Status: Married  Catering manager Violence: Not At Risk (02/14/2023)   Humiliation, Afraid, Rape, and Kick questionnaire    Fear of Current or Ex-Partner: No    Emotionally Abused: No    Physically Abused: No    Sexually Abused: No    Outpatient Medications Prior to Visit  Medication Sig Dispense Refill   acetaminophen (TYLENOL) 325 MG  tablet Take 2 tablets (650 mg total) by mouth every 6 (six) hours as needed for mild pain (or Fever >/= 101). 100 tablet 0   atorvastatin (LIPITOR) 80 MG tablet TAKE 1 TABLET BY MOUTH EVERY DAY 90 tablet 1   Iron, Ferrous Sulfate, 325 (65 Fe) MG TABS Take 325 mg by mouth daily. 30 tablet 2   metoprolol succinate (TOPROL-XL) 25 MG 24 hr tablet Take 1 tablet (25 mg total) by mouth daily. 90 tablet 2   nitroGLYCERIN (NITROSTAT) 0.4 MG SL tablet Place 1 tablet (0.4 mg total) under the tongue every 5 (five) minutes x 3 doses as needed for chest pain (if no relief after 3rd dose, proceed to the ED for an evaluation or call 911). 25 tablet 3   pantoprazole (PROTONIX) 40 MG tablet Take 1 tablet (40 mg total) by mouth 2 (two) times daily. 180 tablet 0   Potassium Chloride ER 20 MEQ TBCR Take 1 tablet (20 mEq total) by mouth daily. 1 tab daily by mouth 30 tablet 1   silver sulfADIAZINE (SILVADENE) 1 % cream Apply 1 Application topically daily. (Patient not taking: Reported on 02/21/2023) 50 g 0   torsemide (DEMADEX) 20 MG tablet Take 2 tablets (40 mg total) by mouth 2 (two) times daily. 360 tablet 3   No facility-administered medications prior to visit.    Allergies  Allergen Reactions   Augmentin [Amoxicillin-Pot Clavulanate] Nausea And Vomiting and Other (See Comments)    Has patient had a PCN reaction causing immediate rash, facial/tongue/throat swelling, SOB or lightheadedness with hypotension: Unknown Has patient had a PCN reaction causing severe rash involving mucus membranes or skin necrosis: No Has patient had a PCN reaction that required hospitalization: No Has patient had a PCN reaction occurring within the last 10 years: No If all of the above answers are "NO", then may proceed with Cephalosporin use.     ROS Review of Systems    Objective:    Physical Exam  There were no vitals taken for this visit. Wt Readings from Last 3 Encounters:  02/16/23 186 lb 1.1 oz (84.4 kg)  02/12/23  194 lb 1.9 oz (88.1 kg)  02/11/23 192 lb (87.1 kg)    Lab Results  Component Value Date   TSH 2.980 02/12/2023   Lab Results  Component Value Date   WBC 5.3 02/19/2023   HGB 7.8 (L) 02/19/2023  HCT 22.7 (L) 02/19/2023   MCV 102 (H) 02/19/2023   PLT 194 02/19/2023   Lab Results  Component Value Date   NA 136 02/16/2023   K 3.0 (L) 02/16/2023   CO2 30 02/16/2023   GLUCOSE 99 02/16/2023   BUN 11 02/16/2023   CREATININE 0.87 02/16/2023   BILITOT 3.1 (H) 02/14/2023   ALKPHOS 356 (H) 02/14/2023   AST 42 (H) 02/14/2023   ALT 31 02/14/2023   PROT 6.1 (L) 02/14/2023   ALBUMIN 3.1 (L) 02/14/2023   CALCIUM 8.0 (L) 02/16/2023   ANIONGAP 8 02/16/2023   EGFR 68 02/11/2023   Lab Results  Component Value Date   CHOL 110 11/12/2021   Lab Results  Component Value Date   HDL 74 11/12/2021   Lab Results  Component Value Date   LDLCALC 31 11/12/2021   Lab Results  Component Value Date   TRIG 23 11/12/2021   Lab Results  Component Value Date   CHOLHDL 1.5 11/12/2021   Lab Results  Component Value Date   HGBA1C 4.6 (L) 11/18/2022      Assessment & Plan:  There are no diagnoses linked to this encounter.  Follow-up: No follow-ups on file.   [name], FNP

## 2023-02-24 NOTE — Patient Instructions (Addendum)
I appreciate the opportunity to provide care to you today!    Follow up:  3 months  Labs: please stop by the lab today to get your blood drawn (CBC, BMP)   1)Very low-salt diet advised--- less than 2 g of sodium per day advised 2)Weigh yourself daily, call if you gain more than 3 pounds in 1 day or more than 5 pounds in 1 week as your diuretic medications may need to be adjusted 3)Limit your Fluid  intake to no more than 60 ounces (1.8 Liters) per day 4)Your Diuretic--- continue taking torsemide/Demadex  40 mg twice daily 5)Right Leg wound--Cut piece of Aquacel [place] # [phone]) and apply to right leg wound Q day, then cover with ABD pad and kerlex.     Please continue to a heart-healthy diet and increase your physical activities. Try to exercise for at least five days a week.      It was a pleasure to see you and I look forward to continuing to work together on your health and well-being. Please do not hesitate to call the office if you need care or have questions about your care.   Have a wonderful day and week. With Gratitude, [name] MSN, FNP-BC

## 2023-02-25 ENCOUNTER — Encounter: Payer: Self-pay | Admitting: *Deleted

## 2023-02-25 ENCOUNTER — Encounter: Payer: Self-pay | Admitting: Family Medicine

## 2023-02-25 ENCOUNTER — Ambulatory Visit (INDEPENDENT_AMBULATORY_CARE_PROVIDER_SITE_OTHER): Payer: Medicare HMO | Admitting: Family Medicine

## 2023-02-25 ENCOUNTER — Other Ambulatory Visit: Payer: Self-pay | Admitting: Gastroenterology

## 2023-02-25 ENCOUNTER — Telehealth: Payer: Self-pay | Admitting: *Deleted

## 2023-02-25 VITALS — BP 94/59 | HR 113 | Ht 71.0 in | Wt 188.1 lb

## 2023-02-25 DIAGNOSIS — D649 Anemia, unspecified: Secondary | ICD-10-CM | POA: Diagnosis not present

## 2023-02-25 DIAGNOSIS — I872 Venous insufficiency (chronic) (peripheral): Secondary | ICD-10-CM

## 2023-02-25 MED ORDER — PANTOPRAZOLE SODIUM 40 MG PO TBEC: 40.0000 mg | DELAYED_RELEASE_TABLET | Freq: Two times a day (BID) | ORAL | 0 refills | Status: DC

## 2023-02-25 NOTE — Telephone Encounter (Signed)
Pt has been rescheduled from 03/04/23 until 03/13/23. New instructions mailed to pt.

## 2023-02-25 NOTE — Assessment & Plan Note (Signed)
He will be following up with wound care on 02/27/2023 Educated to Cut piece of Aquacel [place] # [phone]) and apply to right leg wound Q day, then cover with ABD pad and kerlex Encouraged to report to the ED for increased redness, warmth, swelling, tenderness to palpation, with purulent drainage

## 2023-02-25 NOTE — Telephone Encounter (Signed)
What is the name of the prescription?

## 2023-02-25 NOTE — Assessment & Plan Note (Signed)
Asymptomatic today in the clinic Will assess CBC and BMP Encouraged to increase his intake of iron rich foods Reports that he is scheduled for colonoscopy 02/11/2023

## 2023-02-26 ENCOUNTER — Other Ambulatory Visit: Payer: Self-pay | Admitting: Family Medicine

## 2023-02-26 DIAGNOSIS — R5383 Other fatigue: Secondary | ICD-10-CM

## 2023-02-26 LAB — CBC
Hematocrit: 22.9 % — ABNORMAL LOW (ref 37.5–51.0)
Hemoglobin: 7.5 g/dL — ABNORMAL LOW (ref 13.0–17.7)
MCH: 34.1 pg — ABNORMAL HIGH (ref 26.6–33.0)
MCHC: 32.8 g/dL (ref 31.5–35.7)
MCV: 104 fL — ABNORMAL HIGH (ref 79–97)
NRBC: 1 % — ABNORMAL HIGH (ref 0–0)
Platelets: 200 10*3/uL (ref 150–450)
RBC: 2.2 x10E6/uL — CL (ref 4.14–5.80)
RDW: 20.4 % — ABNORMAL HIGH (ref 11.6–15.4)
WBC: 5.4 10*3/uL (ref 3.4–10.8)

## 2023-02-26 NOTE — Progress Notes (Signed)
Please inform the patient that his hemoglobin has decreased from 7.8 to 7.5.  I recommend that he continues taking his daily iron supplement and increasing his dietary intake of iron rich foods such as eggs, liver, salmon, chicken and [place], cabbage, broccoli, Brussels sprouts.  Please encouraged the patient to return for labs in a week to assess his CBC.

## 2023-02-27 ENCOUNTER — Other Ambulatory Visit: Payer: Self-pay

## 2023-02-27 ENCOUNTER — Telehealth: Payer: Self-pay | Admitting: Family Medicine

## 2023-02-27 ENCOUNTER — Ambulatory Visit (HOSPITAL_COMMUNITY): Payer: Medicare HMO | Admitting: Physical Therapy

## 2023-02-27 ENCOUNTER — Other Ambulatory Visit: Payer: Self-pay | Admitting: Family Medicine

## 2023-02-27 DIAGNOSIS — S81801A Unspecified open wound, right lower leg, initial encounter: Secondary | ICD-10-CM

## 2023-02-27 DIAGNOSIS — R2689 Other abnormalities of gait and mobility: Secondary | ICD-10-CM

## 2023-02-27 DIAGNOSIS — M6281 Muscle weakness (generalized): Secondary | ICD-10-CM

## 2023-02-27 DIAGNOSIS — R29898 Other symptoms and signs involving the musculoskeletal system: Secondary | ICD-10-CM

## 2023-02-27 DIAGNOSIS — S31809A Unspecified open wound of unspecified buttock, initial encounter: Secondary | ICD-10-CM

## 2023-02-27 DIAGNOSIS — L97919 Non-pressure chronic ulcer of unspecified part of right lower leg with unspecified severity: Secondary | ICD-10-CM

## 2023-02-27 NOTE — Therapy (Signed)
OUTPATIENT PHYSICAL THERAPY NEURO EVALUATION   Patient Name: Leonard Cisneros MRN: [id number] DOB:1940-11-28, 82 y.o., male Today's Date: 02/27/2023   PCP: [name], FNP REFERRING PROVIDER:  [name], FNP END OF SESSION:  PT End of Session - 02/27/23 1706     Visit Number 1    Number of Visits 12    Date for PT Re-Evaluation 04/10/23    Authorization Type Primary: [employer] Medicare Secondary: generic commercial    Progress Note Due on Visit 10    PT Start Time 1345    PT Stop Time 1430    PT Time Calculation (min) 45 min    Activity Tolerance Patient tolerated treatment well    Behavior During Therapy WFL for tasks assessed/performed             Past Medical History:  Diagnosis Date   Aortic stenosis    Atrial flutter (HCC)    a. s/p remote ablation by Dr. Ladona Ridgel.   CAD (coronary artery disease)    a. s/p CABG 2003. b. 01/2016: unstable angina - s/p BMS to SVG-ramus intermediate, patent LIMA-mLAD, patent RIMA-dRCA.    Chronic lower back pain    Essential hypertension    Hyperlipidemia    Ischemic cardiomyopathy    a. Cath 01/2016 -  EF 50-55% with mild distal inferior hypocontractility.   Peripheral neuropathy 04/04/2014   Permanent atrial fibrillation [facility])    Past Surgical History:  Procedure Laterality Date   BACK SURGERY     BIOPSY  11/21/2022   Procedure: BIOPSY;  Surgeon: Napoleon Form, MD;  Location: [facility] ENDOSCOPY;  Service: Gastroenterology;;   BIOPSY  02/15/2023   Procedure: BIOPSY;  Surgeon: Lanelle Bal, DO;  Location: [facility] ENDO SUITE;  Service: Endoscopy;;   CARDIAC CATHETERIZATION  ~ 2000; 2003   CARDIAC CATHETERIZATION N/A 02/01/2016   Procedure: Left Heart Cath and Cors/Grafts Angiography;  Surgeon: Lennette Bihari, MD;  Location: [facility] INVASIVE CV LAB;  Service: Cardiovascular;  Laterality: N/A;   CARDIAC CATHETERIZATION N/A 02/01/2016   Procedure: Coronary Stent Intervention;  Surgeon: Lennette Bihari, MD;  Location: [facility] INVASIVE CV  LAB;  Service: Cardiovascular;  Laterality: N/A;   CATARACT EXTRACTION W/PHACO Left 02/09/2019   Procedure: CATARACT EXTRACTION PHACO AND INTRAOCULAR LENS PLACEMENT (IOC);  Surgeon: Fabio Pierce, MD;  Location: [facility] ORS;  Service: Ophthalmology;  Laterality: Left;  CDE: 27.70   CATARACT EXTRACTION W/PHACO Right 02/23/2019   Procedure: CATARACT EXTRACTION PHACO AND INTRAOCULAR LENS PLACEMENT RIGHT EYE;  Surgeon: Fabio Pierce, MD;  Location: [facility] ORS;  Service: Ophthalmology;  Laterality: Right;  CDE: 10.15   COLONOSCOPY  06/04/2002   Normal colon and rectum   COLONOSCOPY  07/08/2012   Procedure: COLONOSCOPY;  Surgeon: Corbin Ade, MD;  Location: [facility] ENDO SUITE;  Service: Endoscopy;  Laterality: N/A;  11:30   COLONOSCOPY N/A 10/02/2017   Procedure: COLONOSCOPY;  Surgeon: Corbin Ade, MD;  Location: [facility] ENDO SUITE;  Service: Endoscopy;  Laterality: N/A;  10:30am   CORONARY ANGIOPLASTY WITH STENT PLACEMENT  02/01/2016   CORONARY ARTERY BYPASS GRAFT  2003   LIMA to LAD,LIMA to distal RCA,SVG to ramus intermediate vessel.   ESOPHAGOGASTRODUODENOSCOPY (EGD) WITH PROPOFOL N/A 11/21/2022   Procedure: ESOPHAGOGASTRODUODENOSCOPY (EGD) WITH PROPOFOL;  Surgeon: Napoleon Form, MD;  Location: [facility] ENDOSCOPY;  Service: Gastroenterology;  Laterality: N/A;   ESOPHAGOGASTRODUODENOSCOPY (EGD) WITH PROPOFOL N/A 12/27/2022   Procedure: ESOPHAGOGASTRODUODENOSCOPY (EGD) WITH PROPOFOL;  Surgeon: Lanelle Bal, DO;  Location: [facility] END[facility] SUITE;  Service: Endoscopy;  Laterality: N/A;   ESOPHAGOGASTRODUODENOSCOPY (EGD) WITH PROPOFOL N/A 02/15/2023   Procedure: ESOPHAGOGASTRODUODENOSCOPY (EGD) WITH PROPOFOL;  Surgeon: Lanelle Bal, DO;  Location: [facility] ENDO SUITE;  Service: Endoscopy;  Laterality: N/A;   FEMORAL REVISION Right 03/30/2019   Procedure: Femoral revision right total hip arthroplasty;  Surgeon: Ollen Gross, MD;  Location: [facility] ORS;  Service: Orthopedics;  Laterality: Right;    FOREIGN BODY REMOVAL  Right 04/02/2014   Procedure: FOREIGN BODY REMOVAL RIGHT FOOT;  Surgeon: Dalia Heading, MD;  Location: [facility] ORS;  Service: General;  Laterality: Right;   JOINT REPLACEMENT     JOINT REPLACEMENT     LUMBAR DISC SURGERY     REVISION TOTAL HIP ARTHROPLASTY Bilateral 2005-2012   right-left   RIGHT/LEFT HEART CATH AND CORONARY/GRAFT ANGIOGRAPHY N/A 09/27/2022   Procedure: RIGHT/LEFT HEART CATH AND CORONARY/GRAFT ANGIOGRAPHY;  Surgeon: [place], Peter M, MD;  Location: [facility] INVASIVE CV LAB;  Service: Cardiovascular;  Laterality: N/A;   SHOULDER OPEN ROTATOR CUFF REPAIR Right    TOTAL HIP ARTHROPLASTY Right 1999   TOTAL HIP ARTHROPLASTY Left 2008   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   Patient Active Problem List   Diagnosis Date Noted   Acute exacerbation of CHF (congestive heart failure) (HCC) 02/14/2023   Elevated alkaline phosphatase level 02/14/2023   Symptomatic anemia 02/14/2023   Acute on chronic heart failure with preserved ejection fraction (HFpEF) (HCC) 02/14/2023   Pressure injury of skin 02/14/2023   Decubitus ulcer of buttock, right, unstageable (HCC) 02/12/2023   Fatigue 02/12/2023   Stasis dermatitis of both legs 01/11/2023   Permanent atrial fibrillation (HCC) 12/27/2022   Contraindication to anticoagulation therapy 12/27/2022   Hemorrhagic shock (HCC) 12/26/2022   ABLA (acute blood loss anemia) 12/26/2022   Gastrointestinal hemorrhage 12/26/2022   Lactic acidosis 12/26/2022   Coronary artery disease involving native coronary artery of native heart without angina pectoris 12/26/2022   Hypovolemic shock (HCC) 12/26/2022   Duodenal ulcer 11/21/2022   Gastritis and gastroduodenitis 11/21/2022   Heme positive stool 11/20/2022   Melena 11/20/2022   Anemia due to chronic blood loss 11/20/2022    History of CAD (coronary artery disease) 11/19/2022   Elevated troponin 11/19/2022   SOB (shortness of breath) 11/18/2022   Acute respiratory failure with hypoxia (HCC) 09/27/2022   Nonrheumatic aortic valve stenosis 09/27/2022   Unstable angina (HCC) 09/27/2022   Acute heart failure (HCC) 09/24/2022   Hypokalemia 09/24/2022   Hyponatremia 09/24/2022   Abnormal liver enzymes 09/24/2022   Hyperbilirubinemia 09/24/2022   Volume overload 09/24/2022   Acquired thrombophilia (HCC) 09/24/2022   Severe anemia 09/24/2022   Cellulitis 11/12/2021   ETOH abuse 11/12/2021   Ambulatory dysfunction 11/12/2021   Severe pulmonary hypertension (HCC) 11/12/2021   Moderate aortic stenosis 11/12/2021   Systolic congestive heart failure (HCC) 11/12/2021   Lumbar post-laminectomy syndrome 06/15/2021   Chronic pain syndrome 06/15/2021   Lumbar pain 08/31/2020   Pain of left hip joint 08/03/2020   History of revision of total replacement of right hip joint 06/10/2019   Persistent atrial fibrillation (HCC) 04/09/2019   Atrial fibrillation with RVR (HCC) 04/08/2019   Acute on chronic diastolic congestive heart failure (HCC)    Macrocytic anemia    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   Chest pain 10/22/2018   Physical deconditioning 04/26/2018  Unsteady gait 04/26/2018   Closed fracture of femur, greater trochanter (HCC) 04/25/2018   Pain in lower limb 04/24/2018   Fall 04/21/2018   Peripheral vascular disease (HCC) 04/21/2018   History of adenomatous polyp of colon 08/20/2017   Ischemic myocardial dysfunction 02/02/2016   Recurrent coronary arteriosclerosis after percutaneous transluminal coronary angioplasty 02/01/2016   Exercise-induced angina 01/28/2016   Hyperlipidemia 01/28/2016   Neuropathic pain 06/13/2015   Mild obesity 04/20/2015   Essential hypertension 04/20/2015   Disorder of peripheral nervous system 04/04/2014   Foreign body (FB) in soft tissue 03/31/2014   Disorder  of glucose metabolism (HCC) 03/31/2014   Obesity with body mass index 30 or greater 03/31/2014   History of coronary artery bypass graft 01/04/2014   Tear of left rotator cuff 10/22/2013   Rotator cuff syndrome of left shoulder 10/22/2013   Long term (current) use of anticoagulants 06/18/2013   Encounter for general adult medical examination without abnormal findings 06/18/2012   High risk medication use 06/18/2012    ONSET DATE: chronic  REFERRING DIAG: L89.310 (ICD-10-CM) - Decubitus ulcer of buttock, right, unstageable (HCC)  THERAPY DIAG:  L89.310 (ICD-10-CM) - Decubitus ulcer of buttock, right, unstageable (HCC)  Rationale for Evaluation and Treatment: Rehabilitation  Subjective:   PT states that he  uses axillary crutches to ambulate. About 7-8 years ago with drop foot and a fall. He uses 2 canes to walk at home. Patient having mobility problems but tries to stay active at home. He likes to cook and work out in the yard. He is limited with doing anything physical.     Medical hx:  afib, CAD, HTN, chronic back pain, neuropathy, HLD, bilateral THA, hx back surgery, recent hospital admission for anemia from 12/25/22-12/29/22      Wound Therapy - 02/27/23 0001     Subjective Pt states hat he has had mm weakness for seven years.  He states that they are not sure why and he has been to several MD's and to [facility].  He has a Rt leg wound which appeared 3-4 weeks ago which continually drains, the MD has given him some salve but that does not seem to be helping.    Pain Scale 0-10    Pain Score 0-No pain    Pressure Injury Properties Date First Assessed: 02/14/23 Time First Assessed: 1532 Location: Sacrum Location Orientation: Mid Staging: Stage 2 -  Partial thickness loss of dermis presenting as a shallow open injury with a red, pink wound bed without slough. Wound Description (Comments): Pink, raw, painful Present on Admission: Yes   Wound Length (cm) 1.2 cm    Wound Width (cm) 0.3 cm     Wound Surface Area (cm^2) 0.36 cm^2    Wound Properties Date First Assessed: 02/14/23 Time First Assessed: 1900 Wound Type: Non-pressure wound Location: Pretibial Location Orientation: Right Wound Description (Comments): weeping; open Present on Admission: Yes   Wound Image Images linked: 1    Dressing Type Compression wrap;None    Dressing Changed New    Dressing Status None    Dressing Change Frequency PRN    % Wound base Red or Granulating 80%    % Wound base Yellow/Fibrinous Exudate 20%    Peri-wound Assessment Erythema (blanchable);Edema    Wound Length (cm) 2.5 cm    Wound Width (cm) 0.5 cm    Wound Surface Area (cm^2) 1.25 cm^2    Drainage Amount Scant    Treatment Cleansed;Debridement (Selective);Other (Comment)    Wound Properties Date First  Assessed: 02/27/23 Time First Assessed: 1305 Wound Type: Non-pressure wound Location: Pretibial Location Orientation: Right Wound Description (Comments): the second and 3 rd anterior wound inferior to the first are the same size witn no granulation t Present on Admission: Yes   Dressing Type None    Wound Length (cm) 0.8 cm    Wound Width (cm) 1 cm    Wound Surface Area (cm^2) 0.8 cm^2    Drainage Amount Copious    Drainage Description Serous    Non-staged Wound Description Full thickness    Treatment Cleansed;Debridement (Selective)    Wound Properties Date First Assessed: 02/27/23 Time First Assessed: 1330 Wound Type: Non-pressure wound Location: Leg Location Orientation: Right Wound Description (Comments): posterior wound Present on Admission: Yes   Dressing Type None    Dressing Change Frequency PRN    % Wound base Red or Granulating 90%    % Wound base Yellow/Fibrinous Exudate 10%    Wound Length (cm) 3 cm    Wound Width (cm) 2.5 cm    Wound Surface Area (cm^2) 7.5 cm^2    Drainage Amount Minimal    Drainage Description Serous    Treatment Cleansed;Debridement (Selective)    Selective Debridement (non-excisional) - Location wound  beds    Selective Debridement (non-excisional) - Tools Used Forceps;Scalpel    Selective Debridement (non-excisional) - Tissue Removed slough    Wound Therapy - Clinical Statement see below    Dressing  xeroform, 2x2 and 2" kling to toes., profore to LE            LOWER EXTREMITY MMT:   MMT Right eval Left eval  Hip flexion 3 3  Hip extension  4 4   Hip abduction  3- 2+   Hip adduction      Hip internal rotation      Hip external rotation      Knee flexion 4 3+  Knee extension 5 5  Ankle dorsiflexion 1 3+  Ankle plantarflexion      Ankle inversion      Ankle eversion       (Blank rows = not tested)    Rt ankle dorsiflexion lacking 20 FUNCTIONAL TESTS:  5 times sit to stand: seconds with bilateral UE support: 25:93 2 minute walk test: 116 feet Transfer STS: Labored with bilateral UE support   PATIENT EDUCATION: Education details: told to continue initial HEP no new given  Person educated: Patient Education method: Explanation Education comprehension: verbalized understanding   HOME EXERCISE PROGRAM: Refer to first eval in May   GOALS: Goals reviewed with patient? No  SHORT TERM GOALS: Target date: 03/20/23  Pt wounds to no longer be weeping to decrease risk of infection  Baseline: Goal status: INITIAL  2.  PT wounds to be 100% granulated  Baseline:  Goal status: INITIAL  3.  PT to be completing HEP in order to improve LE and core strength to be able to ambulate 200 ft  Baseline:  Goal status: INITIAL  LONG TERM GOALS: Target date: 04/10/23  Wounds on LT LE to be healed  Baseline:  Goal status: INITIAL  2.  PT to be wearing compression to prevent edema on LE  Baseline:  Goal status: INITIAL  3.  Pt to be able to come sit to stand 5 x in less then 15 seconds for decreased risk of falls.  Baseline:  Goal status: INITIAL  4.  PT to be able to walk for 250 ft in a 2 minute period of  time for improved ambulation  Baseline:  Goal status:  INITIAL   ASSESSMENT:  CLINICAL IMPRESSION: Patient is a 82 y.o. male who was seen today for physical therapy evaluation and treatment for L89.310 (ICD-10-CM) - Decubitus ulcer of buttock, right, unstageable (HCC).  The pt is a known patient at this clinic, he was admitted into the hospital with a GI bleed on 4/23/thru 4/27.  At discharge he was referred to OP therapy.  He was seen for 2 therapy sessions for abnormality of gait since 01/23/23 when he was readmitted to the hospital on 6/13 for CHF.  He is now returning with both the original diagnosis of gait disorder as well as a gluteal ulcer.  The therapist checked the gluteal ulcer which is much improved measuring 1.2 x 0.3 cm and is 100% granulated.  Th pt states that his wife is taking care of this wound.  What is more concerning is that his right leg has 7 wounds on it four of which are weeping serous fluid to the point that the pt has a towel wrapped around his leg.  The pt also has a wound on his great and second toe.  The MD office was contacted to request treatment to these wounds.  Mr. Borsuk will benefit from skilled PT to address his mm weakness as well as the multiple ulcers on his Right LE.   OBJECTIVE IMPAIRMENTS: cardiopulmonary status limiting activity, decreased activity tolerance, decreased balance, decreased ROM, decreased strength, increased edema, and decreased skin integrity .   ACTIVITY LIMITATIONS: carrying, lifting, standing, squatting, stairs, dressing, hygiene/grooming, and locomotion level  PARTICIPATION LIMITATIONS: shopping and community activity  PERSONAL FACTORS: Fitness, Past/current experiences, Time since onset of injury/illness/exacerbation, and 3+ comorbidities: CAD< CHF, A fib, B THR   are also affecting patient's functional outcome.   REHAB POTENTIAL: Fair    CLINICAL DECISION MAKING: Evolving/moderate complexity  EVALUATION COMPLEXITY: Moderate  PLAN: PT FREQUENCY: 2x/week  PT DURATION: 6  weeks  PLANNED INTERVENTIONS: Therapeutic exercises, Therapeutic activity, Neuromuscular re-education, Balance training, Gait training, Patient/Family education, Self Care, Joint mobilization, and Manual therapy  PLAN FOR NEXT SESSION: Continue with wound care to Rt LE as well as strengthening and balance exercises. Stretching to Rt gastroc  ask pt about AFO for Rt Foot   Virgina Organ, PT CLT [phone] 02/27/2023, 5:25 PM

## 2023-02-28 ENCOUNTER — Ambulatory Visit (HOSPITAL_COMMUNITY): Payer: Medicare HMO

## 2023-02-28 ENCOUNTER — Encounter (HOSPITAL_COMMUNITY): Payer: Medicare HMO

## 2023-03-01 ENCOUNTER — Ambulatory Visit (HOSPITAL_COMMUNITY)
Admission: RE | Admit: 2023-03-01 | Discharge: 2023-03-01 | Disposition: A | Discharge status: Home / Self Care | Payer: Medicare HMO | Source: Ambulatory Visit | Attending: Cardiology | Admitting: Cardiology

## 2023-03-01 DIAGNOSIS — I4821 Permanent atrial fibrillation: Secondary | ICD-10-CM | POA: Insufficient documentation

## 2023-03-01 DIAGNOSIS — D500 Iron deficiency anemia secondary to blood loss (chronic): Secondary | ICD-10-CM | POA: Diagnosis not present

## 2023-03-01 DIAGNOSIS — I2510 Atherosclerotic heart disease of native coronary artery without angina pectoris: Secondary | ICD-10-CM | POA: Insufficient documentation

## 2023-03-01 MED ORDER — IOHEXOL 350 MG/ML SOLN
95.0000 mL | Freq: Once | INTRAVENOUS | Status: AC | PRN
  Administered 2023-03-01: 95 mL via INTRAVENOUS

## 2023-03-01 NOTE — Patient Instructions (Signed)
   Your procedure is scheduled on: 03/13/2023  Report to [facility] Main Entrance at   12:30  PM.  Call this number if you have problems the morning of surgery: [phone]   Remember:              Follow Directions on the letter you received from Your Physician's office regarding the Bowel Prep              No Smoking the day of Procedure :   Take these medicines the morning of surgery with A SIP OF WATER: Metoprolol and Pantoprazole   Do not wear jewelry, make-up or nail polish.    Do not bring valuables to the hospital.  Contacts, dentures or bridgework may not be worn into surgery.  .   Patients discharged the day of surgery will not be allowed to drive home.     Colonoscopy, Adult, Care After This sheet gives you information about how to care for yourself after your procedure. Your health care provider may also give you more specific instructions. If you have problems or questions, contact your health care provider. What can I expect after the procedure? After the procedure, it is common to have: A small amount of blood in your stool for 24 hours after the procedure. Some gas. Mild abdominal cramping or bloating.  Follow these instructions at home: General instructions  For the first 24 hours after the procedure: Do not drive or use machinery. Do not sign important documents. Do not drink alcohol. Do your regular daily activities at a slower pace than normal. Eat soft, easy-to-digest foods. Rest often. Take over-the-counter or prescription medicines only as told by your health care provider. It is up to you to get the results of your procedure. Ask your health care provider, or the department performing the procedure, when your results will be ready. Relieving cramping and bloating Try walking around when you have cramps or feel bloated. Apply heat to your abdomen as told by your health care provider. Use a heat source that your health care provider recommends, such as  a moist heat pack or a heating pad. Place a towel between your skin and the heat source. Leave the heat on for 20-30 minutes. Remove the heat if your skin turns bright red. This is especially important if you are unable to feel pain, heat, or cold. You may have a greater risk of getting burned. Eating and drinking Drink enough fluid to keep your urine clear or pale yellow. Resume your normal diet as instructed by your health care provider. Avoid heavy or fried foods that are hard to digest. Avoid drinking alcohol for as long as instructed by your health care provider. Contact a health care provider if: You have blood in your stool 2-3 days after the procedure. Get help right away if: You have more than a small spotting of blood in your stool. You pass large blood clots in your stool. Your abdomen is swollen. You have nausea or vomiting. You have a fever. You have increasing abdominal pain that is not relieved with medicine. This information is not intended to replace advice given to you by your health care provider. Make sure you discuss any questions you have with your health care provider. Document Released: 04/03/2004 Document Revised: 05/14/2016 Document Reviewed: 11/01/2015 Elsevier Interactive Patient Education  [employer].

## 2023-03-04 ENCOUNTER — Encounter (HOSPITAL_COMMUNITY): Payer: Medicare HMO | Admitting: Physical Therapy

## 2023-03-05 ENCOUNTER — Encounter (HOSPITAL_COMMUNITY): Payer: Self-pay

## 2023-03-05 ENCOUNTER — Encounter (HOSPITAL_COMMUNITY)
Admission: RE | Admit: 2023-03-05 | Discharge: 2023-03-05 | Disposition: A | Discharge status: Home / Self Care | Payer: Medicare HMO | Source: Ambulatory Visit | Attending: Internal Medicine | Admitting: Internal Medicine

## 2023-03-05 VITALS — BP 104/58 | HR 111 | Temp 97.6°F | Resp 18 | Ht 71.0 in | Wt 188.1 lb

## 2023-03-05 DIAGNOSIS — D649 Anemia, unspecified: Secondary | ICD-10-CM

## 2023-03-05 DIAGNOSIS — Z01812 Encounter for preprocedural laboratory examination: Secondary | ICD-10-CM | POA: Insufficient documentation

## 2023-03-05 DIAGNOSIS — D500 Iron deficiency anemia secondary to blood loss (chronic): Secondary | ICD-10-CM | POA: Insufficient documentation

## 2023-03-05 DIAGNOSIS — Z79899 Other long term (current) drug therapy: Secondary | ICD-10-CM | POA: Insufficient documentation

## 2023-03-05 LAB — CBC WITH DIFFERENTIAL/PLATELET
Abs Immature Granulocytes: 0.04 10*3/uL (ref 0.00–0.07)
Basophils Absolute: 0.1 10*3/uL (ref 0.0–0.1)
Basophils Relative: 1 %
Eosinophils Absolute: 0.1 10*3/uL (ref 0.0–0.5)
Eosinophils Relative: 2 %
HCT: 23.6 % — ABNORMAL LOW (ref 39.0–52.0)
Hemoglobin: 7.5 g/dL — ABNORMAL LOW (ref 13.0–17.0)
Immature Granulocytes: 1 %
Lymphocytes Relative: 13 %
Lymphs Abs: 0.9 10*3/uL (ref 0.7–4.0)
MCH: 35.7 pg — ABNORMAL HIGH (ref 26.0–34.0)
MCHC: 31.8 g/dL (ref 30.0–36.0)
MCV: 112.4 fL — ABNORMAL HIGH (ref 80.0–100.0)
Monocytes Absolute: 0.8 10*3/uL (ref 0.1–1.0)
Monocytes Relative: 12 %
Neutro Abs: 4.8 10*3/uL (ref 1.7–7.7)
Neutrophils Relative %: 71 %
Platelets: 233 10*3/uL (ref 150–400)
RBC: 2.1 MIL/uL — ABNORMAL LOW (ref 4.22–5.81)
RDW: 23.9 % — ABNORMAL HIGH (ref 11.5–15.5)
WBC: 6.8 10*3/uL (ref 4.0–10.5)
nRBC: 0 % (ref 0.0–0.2)

## 2023-03-05 LAB — BASIC METABOLIC PANEL
Anion gap: 10 (ref 5–15)
BUN: 15 mg/dL (ref 8–23)
CO2: 25 mmol/L (ref 22–32)
Calcium: 8.1 mg/dL — ABNORMAL LOW (ref 8.9–10.3)
Chloride: 98 mmol/L (ref 98–111)
Creatinine, Ser: 1.28 mg/dL — ABNORMAL HIGH (ref 0.61–1.24)
GFR, Estimated: 56 mL/min — ABNORMAL LOW (ref >60)
Glucose, Bld: 124 mg/dL — ABNORMAL HIGH (ref 70–99)
Potassium: 3.8 mmol/L (ref 3.5–5.1)
Sodium: 133 mmol/L — ABNORMAL LOW (ref 135–145)

## 2023-03-05 MED ORDER — LIDOCAINE VISCOUS HCL 2 % MT SOLN: 15.0000 mL | Freq: Once | OROMUCOSAL | Status: DC

## 2023-03-06 ENCOUNTER — Ambulatory Visit (HOSPITAL_COMMUNITY): Payer: Medicare HMO | Attending: Family Medicine | Admitting: Physical Therapy

## 2023-03-06 DIAGNOSIS — I2510 Atherosclerotic heart disease of native coronary artery without angina pectoris: Secondary | ICD-10-CM | POA: Diagnosis not present

## 2023-03-06 DIAGNOSIS — I4821 Permanent atrial fibrillation: Secondary | ICD-10-CM | POA: Insufficient documentation

## 2023-03-06 DIAGNOSIS — E785 Hyperlipidemia, unspecified: Secondary | ICD-10-CM | POA: Insufficient documentation

## 2023-03-06 DIAGNOSIS — M6281 Muscle weakness (generalized): Secondary | ICD-10-CM | POA: Insufficient documentation

## 2023-03-06 DIAGNOSIS — R2689 Other abnormalities of gait and mobility: Secondary | ICD-10-CM | POA: Diagnosis not present

## 2023-03-06 DIAGNOSIS — I872 Venous insufficiency (chronic) (peripheral): Secondary | ICD-10-CM | POA: Insufficient documentation

## 2023-03-06 DIAGNOSIS — Z79899 Other long term (current) drug therapy: Secondary | ICD-10-CM | POA: Insufficient documentation

## 2023-03-06 DIAGNOSIS — D649 Anemia, unspecified: Secondary | ICD-10-CM | POA: Diagnosis not present

## 2023-03-06 DIAGNOSIS — I77819 Aortic ectasia, unspecified site: Secondary | ICD-10-CM | POA: Diagnosis not present

## 2023-03-06 DIAGNOSIS — S81801A Unspecified open wound, right lower leg, initial encounter: Secondary | ICD-10-CM | POA: Diagnosis not present

## 2023-03-06 DIAGNOSIS — R29898 Other symptoms and signs involving the musculoskeletal system: Secondary | ICD-10-CM | POA: Diagnosis not present

## 2023-03-06 DIAGNOSIS — Z8719 Personal history of other diseases of the digestive system: Secondary | ICD-10-CM | POA: Diagnosis not present

## 2023-03-06 DIAGNOSIS — I5032 Chronic diastolic (congestive) heart failure: Secondary | ICD-10-CM | POA: Diagnosis not present

## 2023-03-06 DIAGNOSIS — I350 Nonrheumatic aortic (valve) stenosis: Secondary | ICD-10-CM | POA: Diagnosis not present

## 2023-03-06 NOTE — Therapy (Signed)
OUTPATIENT PHYSICAL THERAPY WOUND TREATMENT   Patient Name: Leonard Cisneros MRN: [id number] DOB:03/06/1941, 82 y.o., male Today's Date: 03/06/2023  PCP: [name], FNP REFERRING PROVIDER:  [name], FNP END OF SESSION:  PT End of Session - 03/06/23 1123     Visit Number 2    Number of Visits 12    Date for PT Re-Evaluation 04/10/23    Authorization Type Primary: [employer] Medicare Secondary: generic commercial    Progress Note Due on Visit 10    PT Start Time 0900    PT Stop Time 0950    PT Time Calculation (min) 50 min    Activity Tolerance Patient tolerated treatment well    Behavior During Therapy WFL for tasks assessed/performed             Past Medical History:  Diagnosis Date   Aortic stenosis    Atrial flutter (HCC)    a. s/p remote ablation by Dr. Ladona Ridgel.   CAD (coronary artery disease)    a. s/p CABG 2003. b. 01/2016: unstable angina - s/p BMS to SVG-ramus intermediate, patent LIMA-mLAD, patent RIMA-dRCA.    CHF (congestive heart failure) (HCC)    Chronic lower back pain    Essential hypertension    Hyperlipidemia    Ischemic cardiomyopathy    a. Cath 01/2016 -  EF 50-55% with mild distal inferior hypocontractility.   Peripheral neuropathy 04/04/2014   Permanent atrial fibrillation [facility])    Past Surgical History:  Procedure Laterality Date   BACK SURGERY     BIOPSY  11/21/2022   Procedure: BIOPSY;  Surgeon: Napoleon Form, MD;  Location: [facility] ENDOSCOPY;  Service: Gastroenterology;;   BIOPSY  02/15/2023   Procedure: BIOPSY;  Surgeon: Lanelle Bal, DO;  Location: [facility] ENDO SUITE;  Service: Endoscopy;;   CARDIAC CATHETERIZATION  ~ 2000; 2003   CARDIAC CATHETERIZATION N/A 02/01/2016   Procedure: Left Heart Cath and Cors/Grafts Angiography;  Surgeon: Lennette Bihari, MD;  Location: [facility] INVASIVE CV LAB;  Service: Cardiovascular;  Laterality: N/A;   CARDIAC CATHETERIZATION N/A 02/01/2016   Procedure: Coronary Stent Intervention;  Surgeon: Lennette Bihari, MD;  Location: [facility] INVASIVE CV LAB;  Service: Cardiovascular;  Laterality: N/A;   CATARACT EXTRACTION W/PHACO Left 02/09/2019   Procedure: CATARACT EXTRACTION PHACO AND INTRAOCULAR LENS PLACEMENT (IOC);  Surgeon: Fabio Pierce, MD;  Location: [facility] ORS;  Service: Ophthalmology;  Laterality: Left;  CDE: 27.70   CATARACT EXTRACTION W/PHACO Right 02/23/2019   Procedure: CATARACT EXTRACTION PHACO AND INTRAOCULAR LENS PLACEMENT RIGHT EYE;  Surgeon: Fabio Pierce, MD;  Location: [facility] ORS;  Service: Ophthalmology;  Laterality: Right;  CDE: 10.15   COLONOSCOPY  06/04/2002   Normal colon and rectum   COLONOSCOPY  07/08/2012   Procedure: COLONOSCOPY;  Surgeon: Corbin Ade, MD;  Location: [facility] ENDO SUITE;  Service: Endoscopy;  Laterality: N/A;  11:30   COLONOSCOPY N/A 10/02/2017   Procedure: COLONOSCOPY;  Surgeon: Corbin Ade, MD;  Location: [facility] ENDO SUITE;  Service: Endoscopy;  Laterality: N/A;  10:30am   CORONARY ANGIOPLASTY WITH STENT PLACEMENT  02/01/2016   CORONARY ARTERY BYPASS GRAFT  2003   LIMA to LAD,LIMA to distal RCA,SVG to ramus intermediate vessel.   ESOPHAGOGASTRODUODENOSCOPY (EGD) WITH PROPOFOL N/A 11/21/2022   Procedure: ESOPHAGOGASTRODUODENOSCOPY (EGD) WITH PROPOFOL;  Surgeon: Napoleon Form, MD;  Location: [facility] ENDOSCOPY;  Service: Gastroenterology;  Laterality: N/A;   ESOPHAGOGASTRODUODENOSCOPY (EGD) WITH PROPOFOL N/A 12/27/2022   Procedure: ESOPHAGOGASTRODUODENOSCOPY (EGD) WITH PROPOFOL;  Surgeon: Lanelle Bal,  DO;  Location: [facility] ENDO SUITE;  Service: Endoscopy;  Laterality: N/A;   ESOPHAGOGASTRODUODENOSCOPY (EGD) WITH PROPOFOL N/A 02/15/2023   Procedure: ESOPHAGOGASTRODUODENOSCOPY (EGD) WITH PROPOFOL;  Surgeon: Lanelle Bal, DO;  Location: [facility] ENDO SUITE;  Service: Endoscopy;  Laterality: N/A;   FEMORAL REVISION Right 03/30/2019   Procedure: Femoral revision right total hip arthroplasty;  Surgeon: Ollen Gross, MD;  Location: [facility] ORS;  Service: Orthopedics;  Laterality: Right;     FOREIGN BODY REMOVAL Right 04/02/2014   Procedure: FOREIGN BODY REMOVAL RIGHT FOOT;  Surgeon: Dalia Heading, MD;  Location: [facility] ORS;  Service: General;  Laterality: Right;   JOINT REPLACEMENT     JOINT REPLACEMENT     LUMBAR DISC SURGERY     REVISION TOTAL HIP ARTHROPLASTY Bilateral 2005-2012   right-left   RIGHT/LEFT HEART CATH AND CORONARY/GRAFT ANGIOGRAPHY N/A 09/27/2022   Procedure: RIGHT/LEFT HEART CATH AND CORONARY/GRAFT ANGIOGRAPHY;  Surgeon: [place], Peter M, MD;  Location: [facility] INVASIVE CV LAB;  Service: Cardiovascular;  Laterality: N/A;   SHOULDER OPEN ROTATOR CUFF REPAIR Right    TOTAL HIP ARTHROPLASTY Right 1999   TOTAL HIP ARTHROPLASTY Left 2008   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   Patient Active Problem List   Diagnosis Date Noted   Acute exacerbation of CHF (congestive heart failure) (HCC) 02/14/2023   Elevated alkaline phosphatase level 02/14/2023   Symptomatic anemia 02/14/2023   Acute on chronic heart failure with preserved ejection fraction (HFpEF) (HCC) 02/14/2023   Pressure injury of skin 02/14/2023   Decubitus ulcer of buttock, right, unstageable (HCC) 02/12/2023   Fatigue 02/12/2023   Stasis dermatitis of both legs 01/11/2023   Permanent atrial fibrillation (HCC) 12/27/2022   Contraindication to anticoagulation therapy 12/27/2022   Hemorrhagic shock (HCC) 12/26/2022   ABLA (acute blood loss anemia) 12/26/2022   Gastrointestinal hemorrhage 12/26/2022   Lactic acidosis 12/26/2022   Coronary artery disease involving native coronary artery of native heart without angina pectoris 12/26/2022   Hypovolemic shock (HCC) 12/26/2022   Duodenal ulcer 11/21/2022   Gastritis and gastroduodenitis 11/21/2022   Heme positive stool 11/20/2022   Melena 11/20/2022   Anemia due  to chronic blood loss 11/20/2022   History of CAD (coronary artery disease) 11/19/2022   Elevated troponin 11/19/2022   SOB (shortness of breath) 11/18/2022   Acute respiratory failure with hypoxia (HCC) 09/27/2022   Nonrheumatic aortic valve stenosis 09/27/2022   Unstable angina (HCC) 09/27/2022   Acute heart failure (HCC) 09/24/2022   Hypokalemia 09/24/2022   Hyponatremia 09/24/2022   Abnormal liver enzymes 09/24/2022   Hyperbilirubinemia 09/24/2022   Volume overload 09/24/2022   Acquired thrombophilia (HCC) 09/24/2022   Severe anemia 09/24/2022   Cellulitis 11/12/2021   ETOH abuse 11/12/2021   Ambulatory dysfunction 11/12/2021   Severe pulmonary hypertension (HCC) 11/12/2021   Moderate aortic stenosis 11/12/2021   Systolic congestive heart failure (HCC) 11/12/2021   Lumbar post-laminectomy syndrome 06/15/2021   Chronic pain syndrome 06/15/2021   Lumbar pain 08/31/2020   Pain of left hip joint 08/03/2020   History of revision of total replacement of right hip joint 06/10/2019   Persistent atrial fibrillation (HCC) 04/09/2019   Atrial fibrillation with RVR (HCC) 04/08/2019   Acute on chronic diastolic congestive heart failure (HCC)    Macrocytic anemia    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   Chest  pain 10/22/2018   Physical deconditioning 04/26/2018   Unsteady gait 04/26/2018   Closed fracture of femur, greater trochanter (HCC) 04/25/2018   Pain in lower limb 04/24/2018   Fall 04/21/2018   Peripheral vascular disease (HCC) 04/21/2018   History of adenomatous polyp of colon 08/20/2017   Ischemic myocardial dysfunction 02/02/2016   Recurrent coronary arteriosclerosis after percutaneous transluminal coronary angioplasty 02/01/2016   Exercise-induced angina 01/28/2016   Hyperlipidemia 01/28/2016   Neuropathic pain 06/13/2015   Mild obesity 04/20/2015   Essential hypertension 04/20/2015   Disorder of peripheral nervous system 04/04/2014   Foreign body (FB) in  soft tissue 03/31/2014   Disorder of glucose metabolism (HCC) 03/31/2014   Obesity with body mass index 30 or greater 03/31/2014   History of coronary artery bypass graft 01/04/2014   Tear of left rotator cuff 10/22/2013   Rotator cuff syndrome of left shoulder 10/22/2013   Long term (current) use of anticoagulants 06/18/2013   Encounter for general adult medical examination without abnormal findings 06/18/2012   High risk medication use 06/18/2012    ONSET DATE: chronic  REFERRING DIAG: L89.310 (ICD-10-CM) - Decubitus ulcer of buttock, right, unstageable (HCC)  THERAPY DIAG:  L89.310 (ICD-10-CM) - Decubitus ulcer of buttock, right, unstageable (HCC)  Rationale for Evaluation and Treatment: Rehabilitation  Subjective from Evaluation:   PT states that he  uses axillary crutches to ambulate. About 7-8 years ago with drop foot and a fall. He uses 2 canes to walk at home. Patient having mobility problems but tries to stay active at home. He likes to cook and work out in the yard. He is limited with doing anything physical.   Pt states hat he has had mm weakness for seven years. He states that they are not sure why and he has been to several MD's and to [place]. He has a Rt leg wound which appeared 3-4 weeks ago which continually drains, the MD has given him some salve but that does not seem to be helping.    Medical hx:  afib, CAD, HTN, chronic back pain, neuropathy, HLD, bilateral THA, hx back surgery, recent hospital admission for anemia from 12/25/22-12/29/22      Wound Therapy - 03/06/23 1124     Subjective pt states he had to remove the toe dressings and off his foot as it was uncomfortable.  Reports he has already been measured for compression garments at [facility] a while back but they have not called him to get them.    Pain Scale 0-10    Pain Score 0-No pain    Wound Properties Date First Assessed: 02/27/23 Time First Assessed: 1305 Wound Type: Non-pressure wound Location:  Pretibial Location Orientation: Right Wound Description (Comments): the second and 3 rd anterior wound inferior to the first are the same size witn no granulation t Present on Admission: Yes   Wound Image Images linked: 1    Dressing Type None    Dressing Changed Changed    Dressing Status Old drainage    Dressing Change Frequency PRN    Site / Wound Assessment Yellow;Pink;Red    % Wound base Red or Granulating 50%    % Wound base Yellow/Fibrinous Exudate 50%    Wound Length (cm) 0.8 cm    Wound Width (cm) 1 cm    Wound Surface Area (cm^2) 0.8 cm^2    Drainage Amount Minimal    Drainage Description Serous    Treatment Cleansed;Debridement (Selective)    Wound Properties Date First Assessed: 02/27/23  Time First Assessed: 1330 Wound Type: Non-pressure wound Location: Leg Location Orientation: Right Wound Description (Comments): posterior wound Present on Admission: Yes   Dressing Type --    Dressing Change Frequency --    Wound Properties Date First Assessed: 02/14/23 Time First Assessed: 1900 Wound Type: Non-pressure wound Location: Pretibial Location Orientation: Right Wound Description (Comments): weeping; open Present on Admission: Yes   Dressing Type --    Dressing Status --    Dressing Change Frequency --    Peri-wound Assessment --    Selective Debridement (non-excisional) - Location wound beds    Selective Debridement (non-excisional) - Tools Used Forceps;Scissors    Selective Debridement (non-excisional) - Tissue Removed slough, devitalized tissue    Wound Therapy - Clinical Statement see below    Dressing  xeroform, profore.  2x2 and medipore tape to great and second toe.                LOWER EXTREMITY MMT:   MMT Right eval Left eval  Hip flexion 3 3  Hip extension  4 4   Hip abduction  3- 2+   Hip adduction      Hip internal rotation      Hip external rotation      Knee flexion 4 3+  Knee extension 5 5  Ankle dorsiflexion 1 3+  Ankle plantarflexion       Ankle inversion      Ankle eversion       (Blank rows = not tested)    Rt ankle dorsiflexion lacking 20 FUNCTIONAL TESTS:  5 times sit to stand: seconds with bilateral UE support: 25:93 2 minute walk test: 116 feet Transfer STS: Labored with bilateral UE support   PATIENT EDUCATION: Education details: told to continue initial HEP no new given  Person educated: Patient Education method: Explanation Education comprehension: verbalized understanding   HOME EXERCISE PROGRAM: Refer to first eval in May   GOALS: Goals reviewed with patient? Yes  SHORT TERM GOALS: Target date: 03/20/23  Pt wounds to no longer be weeping to decrease risk of infection  Baseline: Goal status: IN PROGRESS  2.  PT wounds to be 100% granulated  Baseline:  Goal status: IN PROGRESS  3.  PT to be completing HEP in order to improve LE and core strength to be able to ambulate 200 ft  Baseline:  Goal status: IN PROGRESS  LONG TERM GOALS: Target date: 04/10/23  Wounds on LT LE to be healed  Baseline:  Goal status: IN PROGRESS  2.  PT to be wearing compression to prevent edema on LE  Baseline:  Goal status: IN PROGRESS  3.  Pt to be able to come sit to stand 5 x in less then 15 seconds for decreased risk of falls.  Baseline:  Goal status: IN PROGRESS  4.  PT to be able to walk for 250 ft in a 2 minute period of time for improved ambulation  Baseline:  Goal status: IN PROGRESS   ASSESSMENT:  CLINICAL IMPRESSION: Today's session focused on woundcare.  Urged pt to go to [place] following session today to get stockings as he needs to be wearing these on his LT LE.  Rt LE wounds photographed and measured this session.  Pt with 2 wounds remaining on anterior LE that are approximately the same size.  All other wounds, including his posterior LE and sacral wounds are now healed.  PT does have raw areas on great toe and second toe that were  cleansed and dressed today with xerform and  medipore tape,  however these should be healed next session.  Instructed pt to get his nails clipped as they are getting long.  Pt verbalized understanding.  Cleansed and moisturized well prior to redressing with xerform and using profore compression on Rt LE.  Pt reported overall comfort.  Noted applying pressure through axillary region onto crutches so educated to use UE through his hands and not to rest the crutches into his arm pits.    Evaluation: Patient is a 82 y.o. male who was seen today for physical therapy evaluation and treatment for L89.310 (ICD-10-CM) - Decubitus ulcer of buttock, right, unstageable (HCC).  The pt is a known patient at this clinic, he was admitted into the hospital with a GI bleed on 4/23/thru 4/27.  At discharge he was referred to OP therapy.  He was seen for 2 therapy sessions for abnormality of gait since 01/23/23 when he was readmitted to the hospital on 6/13 for CHF.  He is now returning with both the original diagnosis of gait disorder as well as a gluteal ulcer.  The therapist checked the gluteal ulcer which is much improved measuring 1.2 x 0.3 cm and is 100% granulated.  Th pt states that his wife is taking care of this wound.  What is more concerning is that his right leg has 7 wounds on it four of which are weeping serous fluid to the point that the pt has a towel wrapped around his leg.  The pt also has a wound on his great and second toe.  The MD office was contacted to request treatment to these wounds.  Mr. Kamm will benefit from skilled PT to address his mm weakness as well as the multiple ulcers on his Right LE.    OBJECTIVE IMPAIRMENTS: cardiopulmonary status limiting activity, decreased activity tolerance, decreased balance, decreased ROM, decreased strength, increased edema, and decreased skin integrity .   ACTIVITY LIMITATIONS: carrying, lifting, standing, squatting, stairs, dressing, hygiene/grooming, and locomotion level  PARTICIPATION LIMITATIONS:  shopping and community activity  PERSONAL FACTORS: Fitness, Past/current experiences, Time since onset of injury/illness/exacerbation, and 3+ comorbidities: CAD< CHF, A fib, B THR   are also affecting patient's functional outcome.   REHAB POTENTIAL: Fair    CLINICAL DECISION MAKING: Evolving/moderate complexity  EVALUATION COMPLEXITY: Moderate  PLAN: PT FREQUENCY: 2x/week  PT DURATION: 6 weeks  PLANNED INTERVENTIONS: Therapeutic exercises, Therapeutic activity, Neuromuscular re-education, Balance training, Gait training, Patient/Family education, Self Care, Joint mobilization, and Manual therapy  PLAN FOR NEXT SESSION: Continue with wound care to Rt LE as well as strengthening and balance exercises. F/U if received compression stocking for Lt LE and instructed how to don/doff if needed.  Begin Stretching to Rt gastroc if time allows.  Inquire  about AFO for Rt Foot    Lurena Nida, PTA/CLT [facility] Ph: [phone]  03/06/2023, 1:31 PM

## 2023-03-11 ENCOUNTER — Ambulatory Visit (HOSPITAL_COMMUNITY): Payer: Medicare HMO

## 2023-03-11 ENCOUNTER — Encounter (HOSPITAL_COMMUNITY): Payer: Self-pay

## 2023-03-11 DIAGNOSIS — S81801A Unspecified open wound, right lower leg, initial encounter: Secondary | ICD-10-CM | POA: Diagnosis not present

## 2023-03-11 DIAGNOSIS — I350 Nonrheumatic aortic (valve) stenosis: Secondary | ICD-10-CM | POA: Diagnosis not present

## 2023-03-11 DIAGNOSIS — R2689 Other abnormalities of gait and mobility: Secondary | ICD-10-CM | POA: Diagnosis not present

## 2023-03-11 DIAGNOSIS — Z79899 Other long term (current) drug therapy: Secondary | ICD-10-CM | POA: Diagnosis not present

## 2023-03-11 DIAGNOSIS — I872 Venous insufficiency (chronic) (peripheral): Secondary | ICD-10-CM | POA: Diagnosis not present

## 2023-03-11 DIAGNOSIS — I77819 Aortic ectasia, unspecified site: Secondary | ICD-10-CM | POA: Diagnosis not present

## 2023-03-11 DIAGNOSIS — E785 Hyperlipidemia, unspecified: Secondary | ICD-10-CM | POA: Diagnosis not present

## 2023-03-11 DIAGNOSIS — Z8719 Personal history of other diseases of the digestive system: Secondary | ICD-10-CM | POA: Diagnosis not present

## 2023-03-11 DIAGNOSIS — I5032 Chronic diastolic (congestive) heart failure: Secondary | ICD-10-CM | POA: Diagnosis not present

## 2023-03-11 DIAGNOSIS — I4821 Permanent atrial fibrillation: Secondary | ICD-10-CM | POA: Diagnosis not present

## 2023-03-11 DIAGNOSIS — R29898 Other symptoms and signs involving the musculoskeletal system: Secondary | ICD-10-CM | POA: Diagnosis not present

## 2023-03-11 DIAGNOSIS — M6281 Muscle weakness (generalized): Secondary | ICD-10-CM | POA: Diagnosis not present

## 2023-03-11 DIAGNOSIS — D649 Anemia, unspecified: Secondary | ICD-10-CM | POA: Diagnosis not present

## 2023-03-11 DIAGNOSIS — I2510 Atherosclerotic heart disease of native coronary artery without angina pectoris: Secondary | ICD-10-CM | POA: Diagnosis not present

## 2023-03-11 NOTE — Therapy (Signed)
OUTPATIENT PHYSICAL THERAPY WOUND TREATMENT   Patient Name: Leonard Cisneros MRN: [id number] DOB:06/01/1941, 82 y.o., male Today's Date: 03/11/2023  PCP: [name], FNP REFERRING PROVIDER:  [name], FNP END OF SESSION:  PT End of Session - 03/11/23 1240     Visit Number 3    Number of Visits 12    Date for PT Re-Evaluation 04/10/23    Authorization Type Primary: [employer] Medicare Secondary: generic commercial    Progress Note Due on Visit 10    PT Start Time 1051    PT Stop Time 1140    PT Time Calculation (min) 49 min    Activity Tolerance Patient tolerated treatment well    Behavior During Therapy WFL for tasks assessed/performed             Past Medical History:  Diagnosis Date   Aortic stenosis    Atrial flutter (HCC)    a. s/p remote ablation by Dr. Ladona Ridgel.   CAD (coronary artery disease)    a. s/p CABG 2003. b. 01/2016: unstable angina - s/p BMS to SVG-ramus intermediate, patent LIMA-mLAD, patent RIMA-dRCA.    CHF (congestive heart failure) (HCC)    Chronic lower back pain    Essential hypertension    Hyperlipidemia    Ischemic cardiomyopathy    a. Cath 01/2016 -  EF 50-55% with mild distal inferior hypocontractility.   Peripheral neuropathy 04/04/2014   Permanent atrial fibrillation [facility])    Past Surgical History:  Procedure Laterality Date   BACK SURGERY     BIOPSY  11/21/2022   Procedure: BIOPSY;  Surgeon: Napoleon Form, MD;  Location: [facility] ENDOSCOPY;  Service: Gastroenterology;;   BIOPSY  02/15/2023   Procedure: BIOPSY;  Surgeon: Lanelle Bal, DO;  Location: [facility] ENDO SUITE;  Service: Endoscopy;;   CARDIAC CATHETERIZATION  ~ 2000; 2003   CARDIAC CATHETERIZATION N/A 02/01/2016   Procedure: Left Heart Cath and Cors/Grafts Angiography;  Surgeon: Lennette Bihari, MD;  Location: [facility] INVASIVE CV LAB;  Service: Cardiovascular;  Laterality: N/A;   CARDIAC CATHETERIZATION N/A 02/01/2016   Procedure: Coronary Stent Intervention;  Surgeon: Lennette Bihari, MD;  Location: [facility] INVASIVE CV LAB;  Service: Cardiovascular;  Laterality: N/A;   CATARACT EXTRACTION W/PHACO Left 02/09/2019   Procedure: CATARACT EXTRACTION PHACO AND INTRAOCULAR LENS PLACEMENT (IOC);  Surgeon: Fabio Pierce, MD;  Location: [facility] ORS;  Service: Ophthalmology;  Laterality: Left;  CDE: 27.70   CATARACT EXTRACTION W/PHACO Right 02/23/2019   Procedure: CATARACT EXTRACTION PHACO AND INTRAOCULAR LENS PLACEMENT RIGHT EYE;  Surgeon: Fabio Pierce, MD;  Location: [facility] ORS;  Service: Ophthalmology;  Laterality: Right;  CDE: 10.15   COLONOSCOPY  06/04/2002   Normal colon and rectum   COLONOSCOPY  07/08/2012   Procedure: COLONOSCOPY;  Surgeon: Corbin Ade, MD;  Location: [facility] ENDO SUITE;  Service: Endoscopy;  Laterality: N/A;  11:30   COLONOSCOPY N/A 10/02/2017   Procedure: COLONOSCOPY;  Surgeon: Corbin Ade, MD;  Location: [facility] ENDO SUITE;  Service: Endoscopy;  Laterality: N/A;  10:30am   CORONARY ANGIOPLASTY WITH STENT PLACEMENT  02/01/2016   CORONARY ARTERY BYPASS GRAFT  2003   LIMA to LAD,LIMA to distal RCA,SVG to ramus intermediate vessel.   ESOPHAGOGASTRODUODENOSCOPY (EGD) WITH PROPOFOL N/A 11/21/2022   Procedure: ESOPHAGOGASTRODUODENOSCOPY (EGD) WITH PROPOFOL;  Surgeon: Napoleon Form, MD;  Location: [facility] ENDOSCOPY;  Service: Gastroenterology;  Laterality: N/A;   ESOPHAGOGASTRODUODENOSCOPY (EGD) WITH PROPOFOL N/A 12/27/2022   Procedure: ESOPHAGOGASTRODUODENOSCOPY (EGD) WITH PROPOFOL;  Surgeon: Lanelle Bal,  DO;  Location: [facility] ENDO SUITE;  Service: Endoscopy;  Laterality: N/A;   ESOPHAGOGASTRODUODENOSCOPY (EGD) WITH PROPOFOL N/A 02/15/2023   Procedure: ESOPHAGOGASTRODUODENOSCOPY (EGD) WITH PROPOFOL;  Surgeon: Lanelle Bal, DO;  Location: [facility] ENDO SUITE;  Service: Endoscopy;  Laterality: N/A;   FEMORAL REVISION Right 03/30/2019   Procedure: Femoral revision right total hip arthroplasty;  Surgeon: Ollen Gross, MD;  Location: [facility] ORS;  Service: Orthopedics;  Laterality: Right;     FOREIGN BODY REMOVAL Right 04/02/2014   Procedure: FOREIGN BODY REMOVAL RIGHT FOOT;  Surgeon: Dalia Heading, MD;  Location: [facility] ORS;  Service: General;  Laterality: Right;   JOINT REPLACEMENT     JOINT REPLACEMENT     LUMBAR DISC SURGERY     REVISION TOTAL HIP ARTHROPLASTY Bilateral 2005-2012   right-left   RIGHT/LEFT HEART CATH AND CORONARY/GRAFT ANGIOGRAPHY N/A 09/27/2022   Procedure: RIGHT/LEFT HEART CATH AND CORONARY/GRAFT ANGIOGRAPHY;  Surgeon: [place], Peter M, MD;  Location: [facility] INVASIVE CV LAB;  Service: Cardiovascular;  Laterality: N/A;   SHOULDER OPEN ROTATOR CUFF REPAIR Right    TOTAL HIP ARTHROPLASTY Right 1999   TOTAL HIP ARTHROPLASTY Left 2008   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   Patient Active Problem List   Diagnosis Date Noted   Acute exacerbation of CHF (congestive heart failure) (HCC) 02/14/2023   Elevated alkaline phosphatase level 02/14/2023   Symptomatic anemia 02/14/2023   Acute on chronic heart failure with preserved ejection fraction (HFpEF) (HCC) 02/14/2023   Pressure injury of skin 02/14/2023   Decubitus ulcer of buttock, right, unstageable (HCC) 02/12/2023   Fatigue 02/12/2023   Stasis dermatitis of both legs 01/11/2023   Permanent atrial fibrillation (HCC) 12/27/2022   Contraindication to anticoagulation therapy 12/27/2022   Hemorrhagic shock (HCC) 12/26/2022   ABLA (acute blood loss anemia) 12/26/2022   Gastrointestinal hemorrhage 12/26/2022   Lactic acidosis 12/26/2022   Coronary artery disease involving native coronary artery of native heart without angina pectoris 12/26/2022   Hypovolemic shock (HCC) 12/26/2022   Duodenal ulcer 11/21/2022   Gastritis and gastroduodenitis 11/21/2022   Heme positive stool 11/20/2022   Melena 11/20/2022   Anemia due  to chronic blood loss 11/20/2022   History of CAD (coronary artery disease) 11/19/2022   Elevated troponin 11/19/2022   SOB (shortness of breath) 11/18/2022   Acute respiratory failure with hypoxia (HCC) 09/27/2022   Nonrheumatic aortic valve stenosis 09/27/2022   Unstable angina (HCC) 09/27/2022   Acute heart failure (HCC) 09/24/2022   Hypokalemia 09/24/2022   Hyponatremia 09/24/2022   Abnormal liver enzymes 09/24/2022   Hyperbilirubinemia 09/24/2022   Volume overload 09/24/2022   Acquired thrombophilia (HCC) 09/24/2022   Severe anemia 09/24/2022   Cellulitis 11/12/2021   ETOH abuse 11/12/2021   Ambulatory dysfunction 11/12/2021   Severe pulmonary hypertension (HCC) 11/12/2021   Moderate aortic stenosis 11/12/2021   Systolic congestive heart failure (HCC) 11/12/2021   Lumbar post-laminectomy syndrome 06/15/2021   Chronic pain syndrome 06/15/2021   Lumbar pain 08/31/2020   Pain of left hip joint 08/03/2020   History of revision of total replacement of right hip joint 06/10/2019   Persistent atrial fibrillation (HCC) 04/09/2019   Atrial fibrillation with RVR (HCC) 04/08/2019   Acute on chronic diastolic congestive heart failure (HCC)    Macrocytic anemia    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   Chest  pain 10/22/2018   Physical deconditioning 04/26/2018   Unsteady gait 04/26/2018   Closed fracture of femur, greater trochanter (HCC) 04/25/2018   Pain in lower limb 04/24/2018   Fall 04/21/2018   Peripheral vascular disease (HCC) 04/21/2018   History of adenomatous polyp of colon 08/20/2017   Ischemic myocardial dysfunction 02/02/2016   Recurrent coronary arteriosclerosis after percutaneous transluminal coronary angioplasty 02/01/2016   Exercise-induced angina 01/28/2016   Hyperlipidemia 01/28/2016   Neuropathic pain 06/13/2015   Mild obesity 04/20/2015   Essential hypertension 04/20/2015   Disorder of peripheral nervous system 04/04/2014   Foreign body (FB) in  soft tissue 03/31/2014   Disorder of glucose metabolism (HCC) 03/31/2014   Obesity with body mass index 30 or greater 03/31/2014   History of coronary artery bypass graft 01/04/2014   Tear of left rotator cuff 10/22/2013   Rotator cuff syndrome of left shoulder 10/22/2013   Long term (current) use of anticoagulants 06/18/2013   Encounter for general adult medical examination without abnormal findings 06/18/2012   High risk medication use 06/18/2012    ONSET DATE: chronic  REFERRING DIAG: L89.310 (ICD-10-CM) - Decubitus ulcer of buttock, right, unstageable (HCC)  THERAPY DIAG:  L89.310 (ICD-10-CM) - Decubitus ulcer of buttock, right, unstageable (HCC)  Rationale for Evaluation and Treatment: Rehabilitation  Subjective from Evaluation:   PT states that he  uses axillary crutches to ambulate. About 7-8 years ago with drop foot and a fall. He uses 2 canes to walk at home. Patient having mobility problems but tries to stay active at home. He likes to cook and work out in the yard. He is limited with doing anything physical.   Pt states hat he has had mm weakness for seven years. He states that they are not sure why and he has been to several MD's and to [facility]. He has a Rt leg wound which appeared 3-4 weeks ago which continually drains, the MD has given him some salve but that does not seem to be helping.    Medical hx:  afib, CAD, HTN, chronic back pain, neuropathy, HLD, bilateral THA, hx back surgery, recent hospital admission for anemia from 12/25/22-12/29/22      Wound Therapy - 03/11/23 0001     Subjective Pt arrived with new compression garments.  Pt cut dressings on top.  Plans to go to beach for 2 weeks.    Pain Scale 0-10    Pain Score 0-No pain    Wound Properties Date First Assessed: 02/27/23 Time First Assessed: 1305 Wound Type: Non-pressure wound Location: Pretibial Location Orientation: Right Wound Description (Comments): the second and 3 rd anterior wound inferior to the first  are the same size witn no granulation t Present on Admission: Yes   Wound Image Images linked: 1    Dressing Type None    Dressing Changed Changed    Dressing Status Old drainage    Dressing Change Frequency PRN    Site / Wound Assessment Yellow;Pink;Red    % Wound base Red or Granulating 50%   following debridment   % Wound base Yellow/Fibrinous Exudate 50%    Drainage Amount Minimal    Drainage Description Serous    Selective Debridement (non-excisional) - Location wound beds    Selective Debridement (non-excisional) - Tools Used Forceps;Scissors    Selective Debridement (non-excisional) - Tissue Removed slough, devitalized tissue    Wound Therapy - Clinical Statement see below    Dressing  xeroform, profore.  2x2 and medipore tape to great and second  toe.                LOWER EXTREMITY MMT:   MMT Right eval Left eval  Hip flexion 3 3  Hip extension  4 4   Hip abduction  3- 2+   Hip adduction      Hip internal rotation      Hip external rotation      Knee flexion 4 3+  Knee extension 5 5  Ankle dorsiflexion 1 3+  Ankle plantarflexion      Ankle inversion      Ankle eversion       (Blank rows = not tested)    Rt ankle dorsiflexion lacking 20 FUNCTIONAL TESTS:  5 times sit to stand: seconds with bilateral UE support: 25:93 2 minute walk test: 116 feet Transfer STS: Labored with bilateral UE support   PATIENT EDUCATION: Education details: told to continue initial HEP no new given  Person educated: Patient Education method: Explanation Education comprehension: verbalized understanding   HOME EXERCISE PROGRAM: Refer to first eval in May   GOALS: Goals reviewed with patient? Yes  SHORT TERM GOALS: Target date: 03/20/23  Pt wounds to no longer be weeping to decrease risk of infection  Baseline: Goal status: IN PROGRESS  2.  PT wounds to be 100% granulated  Baseline:  Goal status: IN PROGRESS  3.  PT to be completing HEP in order to improve LE and  core strength to be able to ambulate 200 ft  Baseline:  Goal status: IN PROGRESS  LONG TERM GOALS: Target date: 04/10/23  Wounds on LT LE to be healed  Baseline:  Goal status: IN PROGRESS  2.  PT to be wearing compression to prevent edema on LE  Baseline:  Goal status: IN PROGRESS  3.  Pt to be able to come sit to stand 5 x in less then 15 seconds for decreased risk of falls.  Baseline:  Goal status: IN PROGRESS  4.  PT to be able to walk for 250 ft in a 2 minute period of time for improved ambulation  Baseline:  Goal status: IN PROGRESS   ASSESSMENT:  CLINICAL IMPRESSION: Pt arrived with dressings half removed with noted bottle necking at top of shin.  Pt encouraged to remove outer layer vs cutting part of the dressings.  Noted weeping from wound exposed.  Pt brought compression garments with him to session.  Continued with profore due to increased drainage.  Selective debridement for removal of slough from wound to promote healing.  Continued with xeroform and profore compression garments.  Reviewed self care, encouraged to keep wounds covered at all times, to keep dressings dry and pt given wound bag for beach trip (instructed to return bag next session).  Reports of comfort at EOS.    Evaluation: Patient is a 82 y.o. male who was seen today for physical therapy evaluation and treatment for L89.310 (ICD-10-CM) - Decubitus ulcer of buttock, right, unstageable (HCC).  The pt is a known patient at this clinic, he was admitted into the hospital with a GI bleed on 4/23/thru 4/27.  At discharge he was referred to OP therapy.  He was seen for 2 therapy sessions for abnormality of gait since 01/23/23 when he was readmitted to the hospital on 6/13 for CHF.  He is now returning with both the original diagnosis of gait disorder as well as a gluteal ulcer.  The therapist checked the gluteal ulcer which is much improved measuring 1.2 x 0.3 cm  and is 100% granulated.  Th pt states that his wife is  taking care of this wound.  What is more concerning is that his right leg has 7 wounds on it four of which are weeping serous fluid to the point that the pt has a towel wrapped around his leg.  The pt also has a wound on his great and second toe.  The MD office was contacted to request treatment to these wounds.  Mr. Wenzinger will benefit from skilled PT to address his mm weakness as well as the multiple ulcers on his Right LE.    OBJECTIVE IMPAIRMENTS: cardiopulmonary status limiting activity, decreased activity tolerance, decreased balance, decreased ROM, decreased strength, increased edema, and decreased skin integrity .   ACTIVITY LIMITATIONS: carrying, lifting, standing, squatting, stairs, dressing, hygiene/grooming, and locomotion level  PARTICIPATION LIMITATIONS: shopping and community activity  PERSONAL FACTORS: Fitness, Past/current experiences, Time since onset of injury/illness/exacerbation, and 3+ comorbidities: CAD< CHF, A fib, B THR   are also affecting patient's functional outcome.   REHAB POTENTIAL: Fair    CLINICAL DECISION MAKING: Evolving/moderate complexity  EVALUATION COMPLEXITY: Moderate  PLAN: PT FREQUENCY: 2x/week  PT DURATION: 6 weeks  PLANNED INTERVENTIONS: Therapeutic exercises, Therapeutic activity, Neuromuscular re-education, Balance training, Gait training, Patient/Family education, Self Care, Joint mobilization, and Manual therapy  PLAN FOR NEXT SESSION: Continue with wound care to Rt LE as well as strengthening and balance exercises. F/U if received compression stocking for Lt LE and instructed how to don/doff if needed.  Begin Stretching to Rt gastroc if time allows.  Inquire  about AFO for Rt Foot    Becky Sax, LPTA/CLT; CBIS [phone]   Juel Burrow, PTA 03/11/2023, 12:49 PM  03/11/2023, 12:49 PM

## 2023-03-12 ENCOUNTER — Ambulatory Visit (HOSPITAL_COMMUNITY): Payer: Medicare HMO | Admitting: Physical Therapy

## 2023-03-13 ENCOUNTER — Ambulatory Visit (HOSPITAL_COMMUNITY)
Admission: RE | Admit: 2023-03-13 | Discharge: 2023-03-13 | Disposition: A | Discharge status: Home / Self Care | Payer: Medicare HMO | Attending: Internal Medicine | Admitting: Internal Medicine

## 2023-03-13 ENCOUNTER — Ambulatory Visit (HOSPITAL_COMMUNITY): Payer: Medicare HMO | Admitting: Anesthesiology

## 2023-03-13 ENCOUNTER — Ambulatory Visit (HOSPITAL_BASED_OUTPATIENT_CLINIC_OR_DEPARTMENT_OTHER): Payer: Medicare HMO | Admitting: Anesthesiology

## 2023-03-13 ENCOUNTER — Encounter (HOSPITAL_COMMUNITY)
Admission: RE | Disposition: A | Discharge status: Home / Self Care | Payer: Self-pay | Source: Home / Self Care | Attending: Internal Medicine

## 2023-03-13 ENCOUNTER — Encounter (HOSPITAL_COMMUNITY): Payer: Self-pay

## 2023-03-13 DIAGNOSIS — K635 Polyp of colon: Secondary | ICD-10-CM

## 2023-03-13 DIAGNOSIS — D120 Benign neoplasm of cecum: Secondary | ICD-10-CM

## 2023-03-13 DIAGNOSIS — I25119 Atherosclerotic heart disease of native coronary artery with unspecified angina pectoris: Secondary | ICD-10-CM | POA: Insufficient documentation

## 2023-03-13 DIAGNOSIS — I509 Heart failure, unspecified: Secondary | ICD-10-CM | POA: Insufficient documentation

## 2023-03-13 DIAGNOSIS — D122 Benign neoplasm of ascending colon: Secondary | ICD-10-CM

## 2023-03-13 DIAGNOSIS — D509 Iron deficiency anemia, unspecified: Secondary | ICD-10-CM | POA: Diagnosis not present

## 2023-03-13 DIAGNOSIS — K219 Gastro-esophageal reflux disease without esophagitis: Secondary | ICD-10-CM | POA: Insufficient documentation

## 2023-03-13 DIAGNOSIS — I4821 Permanent atrial fibrillation: Secondary | ICD-10-CM | POA: Diagnosis not present

## 2023-03-13 DIAGNOSIS — K648 Other hemorrhoids: Secondary | ICD-10-CM | POA: Diagnosis not present

## 2023-03-13 DIAGNOSIS — I110 Hypertensive heart disease with heart failure: Secondary | ICD-10-CM | POA: Insufficient documentation

## 2023-03-13 DIAGNOSIS — D500 Iron deficiency anemia secondary to blood loss (chronic): Secondary | ICD-10-CM

## 2023-03-13 DIAGNOSIS — Z79899 Other long term (current) drug therapy: Secondary | ICD-10-CM

## 2023-03-13 DIAGNOSIS — I5033 Acute on chronic diastolic (congestive) heart failure: Secondary | ICD-10-CM | POA: Diagnosis not present

## 2023-03-13 DIAGNOSIS — Z87891 Personal history of nicotine dependence: Secondary | ICD-10-CM | POA: Insufficient documentation

## 2023-03-13 DIAGNOSIS — Z8711 Personal history of peptic ulcer disease: Secondary | ICD-10-CM | POA: Diagnosis not present

## 2023-03-13 DIAGNOSIS — I739 Peripheral vascular disease, unspecified: Secondary | ICD-10-CM | POA: Diagnosis not present

## 2023-03-13 DIAGNOSIS — I4891 Unspecified atrial fibrillation: Secondary | ICD-10-CM | POA: Diagnosis not present

## 2023-03-13 HISTORY — PX: POLYPECTOMY: SHX5525

## 2023-03-13 HISTORY — PX: COLONOSCOPY WITH PROPOFOL: SHX5780

## 2023-03-13 SURGERY — COLONOSCOPY WITH PROPOFOL
Anesthesia: General

## 2023-03-13 MED ORDER — LACTATED RINGERS IV SOLN: INTRAVENOUS | Status: DC

## 2023-03-13 MED ORDER — PROPOFOL 500 MG/50ML IV EMUL
INTRAVENOUS | Status: DC | PRN
  Administered 2023-03-13: 100 ug/kg/min via INTRAVENOUS

## 2023-03-13 MED ORDER — LIDOCAINE HCL (PF) 2 % IJ SOLN
INTRAMUSCULAR | Status: AC
  Filled 2023-03-13: qty 5

## 2023-03-13 MED ORDER — PHENYLEPHRINE HCL-NACL 20-0.9 MG/250ML-% IV SOLN
INTRAVENOUS | Status: DC | PRN
  Administered 2023-03-13: 50 ug/min via INTRAVENOUS
  Administered 2023-03-13 (×4): 80 ug via INTRAVENOUS

## 2023-03-13 MED ORDER — PROPOFOL 10 MG/ML IV BOLUS
INTRAVENOUS | Status: DC | PRN
  Administered 2023-03-13: 50 mg via INTRAVENOUS

## 2023-03-13 NOTE — Anesthesia Postprocedure Evaluation (Signed)
Anesthesia Post Note  Patient: Leonard Cisneros  Procedure(s) Performed: COLONOSCOPY WITH PROPOFOL POLYPECTOMY  Patient location during evaluation: Phase II Anesthesia Type: General Level of consciousness: awake and alert and oriented Pain management: pain level controlled Vital Signs Assessment: post-procedure vital signs reviewed and stable Respiratory status: spontaneous breathing, nonlabored ventilation and respiratory function stable Cardiovascular status: blood pressure returned to baseline and stable Postop Assessment: no apparent nausea or vomiting Anesthetic complications: no  No notable events documented.   Last Vitals:  Vitals:   03/13/23 1400 03/13/23 1404  BP: (!) 97/52 100/62  Pulse: 83 99  Resp:    Temp:    SpO2: 95% 96%    Last Pain:  Vitals:   03/13/23 1404  TempSrc:   PainSc: 3                  Haunani Dickard C Kjersten [place]rm[place]

## 2023-03-13 NOTE — Anesthesia Preprocedure Evaluation (Signed)
Anesthesia Evaluation  Patient identified by MRN, date of birth, ID band Patient awake    Reviewed: Allergy & Precautions, H&P , NPO status , [name] Chart, lab work & pertinent test results, reviewed documented beta blocker date and time   Airway Mallampati: II  TM Distance: >3 FB Neck ROM: Full    Dental  (+) Dental Advisory Given, Partial Upper, Missing   Pulmonary former smoker IMPRESSION: 1. Cardiomegaly with central pulmonary vascular congestion and pulmonary interstitial edema. 2. Moderate right pleural effusion with underlying right basilar atelectasis and/or airspace consolidation. 3. Small left pleural effusion. 4.  Aortic Atherosclerosis (ICD10-I70.0).     Electronically Signed   By: Jackey Loge D.O.   On: 02/14/2023 09:43    Pulmonary exam normal breath sounds clear to auscultation       Cardiovascular Exercise Tolerance: Good hypertension, Pt. on medications and Pt. on home beta blockers + angina  + CAD, + Cardiac Stents, + CABG, + Peripheral Vascular Disease and +CHF  + dysrhythmias Atrial Fibrillation  Rhythm:Regular Rate:Normal + Systolic murmurs Left Ventricle: Left ventricular ejection fraction, by estimation, is 55  to 60%. The left ventricle has normal function. The left ventricle has no  regional wall motion abnormalities. The left ventricular internal cavity  size was normal in size. There is   moderate concentric left ventricular hypertrophy. Left ventricular  diastolic function could not be evaluated due to atrial fibrillation. Left  ventricular diastolic parameters are indeterminate.   Right Ventricle: The right ventricular size is mildly enlarged. No  increase in right ventricular wall thickness. Right ventricular systolic  function is normal. Tricuspid regurgitation signal is inadequate for  assessing PA pressure.   Left Atrium: Left atrial size was severely dilated.   Right Atrium: Right  atrial size was mildly dilated.   Pericardium: Left pleural effusion noted. There is no evidence of  pericardial effusion.   Mitral Valve: The mitral valve is degenerative in appearance. Severe  mitral annular calcification. Mild mitral valve regurgitation. MV peak  gradient, 15.8 mmHg. The mean mitral valve gradient is 4.0 mmHg.   Tricuspid Valve: The tricuspid valve is grossly normal. Tricuspid valve  regurgitation is mild.   Aortic Valve: The aortic valve is tricuspid. There is moderate  calcification of the aortic valve. There is mild to moderate aortic valve  annular calcification. Aortic valve regurgitation is not visualized.  Moderate to severe aortic stenosis is present.  Aortic valve mean gradient measures 24.0 mmHg. Aortic valve peak gradient  measures 42.1 mmHg. Aortic valve area, by VTI measures 1.06 cm.   Pulmonic Valve: The pulmonic valve was grossly normal. Pulmonic valve  regurgitation is trivial.   Aorta: The aortic root is normal in size and structure and aortic  dilatation noted. There is mild dilatation of the ascending aorta,  measuring 39 mm.   Venous: The inferior vena cava is dilated in size with less than 50%  respiratory variability, suggesting right atrial pressure of 15 mmHg.   IAS/Shunts: No atrial level shunt detected by color flow Doppler.   Additional Comments: There is pleural effusion in the left lateral region.      Neuro/Psych  Neuromuscular disease  negative psych ROS   GI/Hepatic Neg liver ROS, PUD,GERD  Medicated and Controlled,,  Endo/Other  negative endocrine ROS    Renal/GU negative Renal ROS  negative genitourinary   Musculoskeletal negative musculoskeletal ROS (+)    Abdominal   Peds negative pediatric ROS (+)  Hematology  (+) Blood dyscrasia, anemia  Anesthesia Other Findings   Reproductive/Obstetrics negative OB ROS                             Anesthesia Physical Anesthesia  Plan  ASA: 3  Anesthesia Plan: General   Post-op Pain Management: Minimal or no pain anticipated   Induction: Intravenous  PONV Risk Score and Plan: Propofol infusion  Airway Management Planned: Nasal Cannula and Natural Airway  Additional Equipment:   Intra-op Plan:   Post-operative Plan:   Informed Consent: I have reviewed the patients History and Physical, chart, labs and discussed the procedure including the risks, benefits and alternatives for the proposed anesthesia with the patient or authorized representative who has indicated his/her understanding and acceptance.     Dental advisory given  Plan Discussed with: Surgeon and CRNA  Anesthesia Plan Comments:         Anesthesia Quick Evaluation

## 2023-03-13 NOTE — Transfer of Care (Signed)
Immediate Anesthesia Transfer of Care Note  Patient: Leonard Cisneros  Procedure(s) Performed: COLONOSCOPY WITH PROPOFOL POLYPECTOMY  Patient Location: Short Stay  Anesthesia Type:General  Level of Consciousness: awake, alert , and oriented  Airway & Oxygen Therapy: Patient Spontanous Breathing  Post-op Assessment: Report given to RN, Post -op Vital signs reviewed and stable, Patient moving all extremities X 4, and Patient able to stick tongue midline  Post vital signs: Reviewed  Last Vitals:  Vitals Value Taken Time  BP 104/57   Temp 97.5   Pulse 94   Resp 23   SpO2 96     Last Pain:  Vitals:   03/13/23 1325  TempSrc:   PainSc: 10-Worst pain ever         Complications: No notable events documented.

## 2023-03-13 NOTE — H&P (Signed)
Primary Care Physician:  [name], FNP Primary Gastroenterologist:  Dr. Marletta Lor  Pre-Procedure History & Physical: HPI:  [name] is a 82 y.o. male is here for a colonoscopy to be performed for iron deficiency anemia.   Past Medical History:  Diagnosis Date   Aortic stenosis    Atrial flutter (HCC)    a. s/p remote ablation by Dr. Ladona Ridgel.   CAD (coronary artery disease)    a. s/p CABG 2003. b. 01/2016: unstable angina - s/p BMS to SVG-ramus intermediate, patent LIMA-mLAD, patent RIMA-dRCA.    CHF (congestive heart failure) (HCC)    Chronic lower back pain    Essential hypertension    Hyperlipidemia    Ischemic cardiomyopathy    a. Cath 01/2016 -  EF 50-55% with mild distal inferior hypocontractility.   Peripheral neuropathy 04/04/2014   Permanent atrial fibrillation [facility])     Past Surgical History:  Procedure Laterality Date   BACK SURGERY     BIOPSY  11/21/2022   Procedure: BIOPSY;  Surgeon: Napoleon Form, MD;  Location: [facility] ENDOSCOPY;  Service: Gastroenterology;;   BIOPSY  02/15/2023   Procedure: BIOPSY;  Surgeon: Lanelle Bal, DO;  Location: [facility] ENDO SUITE;  Service: Endoscopy;;   CARDIAC CATHETERIZATION  ~ 2000; 2003   CARDIAC CATHETERIZATION N/A 02/01/2016   Procedure: Left Heart Cath and Cors/Grafts Angiography;  Surgeon: Lennette Bihari, MD;  Location: [facility] INVASIVE CV LAB;  Service: Cardiovascular;  Laterality: N/A;   CARDIAC CATHETERIZATION N/A 02/01/2016   Procedure: Coronary Stent Intervention;  Surgeon: Lennette Bihari, MD;  Location: [facility] INVASIVE CV LAB;  Service: Cardiovascular;  Laterality: N/A;   CATARACT EXTRACTION W/PHACO Left 02/09/2019   Procedure: CATARACT EXTRACTION PHACO AND INTRAOCULAR LENS PLACEMENT (IOC);  Surgeon: Fabio Pierce, MD;  Location: [facility] ORS;  Service: Ophthalmology;  Laterality: Left;  CDE: 27.70   CATARACT EXTRACTION W/PHACO Right 02/23/2019   Procedure: CATARACT EXTRACTION PHACO AND INTRAOCULAR LENS PLACEMENT RIGHT EYE;  Surgeon:  Fabio Pierce, MD;  Location: [facility] ORS;  Service: Ophthalmology;  Laterality: Right;  CDE: 10.15   COLONOSCOPY  06/04/2002   Normal colon and rectum   COLONOSCOPY  07/08/2012   Procedure: COLONOSCOPY;  Surgeon: Corbin Ade, MD;  Location: [facility] ENDO SUITE;  Service: Endoscopy;  Laterality: N/A;  11:30   COLONOSCOPY N/A 10/02/2017   Procedure: COLONOSCOPY;  Surgeon: Corbin Ade, MD;  Location: [facility] ENDO SUITE;  Service: Endoscopy;  Laterality: N/A;  10:30am   CORONARY ANGIOPLASTY WITH STENT PLACEMENT  02/01/2016   CORONARY ARTERY BYPASS GRAFT  2003   LIMA to LAD,LIMA to distal RCA,SVG to ramus intermediate vessel.   ESOPHAGOGASTRODUODENOSCOPY (EGD) WITH PROPOFOL N/A 11/21/2022   Procedure: ESOPHAGOGASTRODUODENOSCOPY (EGD) WITH PROPOFOL;  Surgeon: Napoleon Form, MD;  Location: [facility] ENDOSCOPY;  Service: Gastroenterology;  Laterality: N/A;   ESOPHAGOGASTRODUODENOSCOPY (EGD) WITH PROPOFOL N/A 12/27/2022   Procedure: ESOPHAGOGASTRODUODENOSCOPY (EGD) WITH PROPOFOL;  Surgeon: Lanelle Bal, DO;  Location: [facility] ENDO SUITE;  Service: Endoscopy;  Laterality: N/A;   ESOPHAGOGASTRODUODENOSCOPY (EGD) WITH PROPOFOL N/A 02/15/2023   Procedure: ESOPHAGOGASTRODUODENOSCOPY (EGD) WITH PROPOFOL;  Surgeon: Lanelle Bal, DO;  Location: [facility] ENDO SUITE;  Service: Endoscopy;  Laterality: N/A;   FEMORAL REVISION Right 03/30/2019   Procedure: Femoral revision right total hip arthroplasty;  Surgeon: Ollen Gross, MD;  Location: [facility] ORS;  Service: Orthopedics;  Laterality: Right;    FOREIGN BODY REMOVAL Right 04/02/2014   Procedure: FOREIGN BODY REMOVAL RIGHT FOOT;  Surgeon: Dalia Heading, MD;  Location: [facility] ORS;  Service: General;  Laterality: Right;   JOINT REPLACEMENT     JOINT REPLACEMENT     LUMBAR DISC SURGERY     REVISION TOTAL HIP ARTHROPLASTY Bilateral 2005-2012   right-left   RIGHT/LEFT HEART CATH AND CORONARY/GRAFT ANGIOGRAPHY N/A 09/27/2022   Procedure: RIGHT/LEFT HEART CATH AND CORONARY/GRAFT  ANGIOGRAPHY;  Surgeon: Swazil[place], Peter M, MD;  Location: [facility] INVASIVE CV LAB;  Service: Cardiovascular;  Laterality: N/A;   SHOULDER OPEN ROTATOR CUFF REPAIR Right    TOTAL HIP ARTHROPLASTY Right 1999   TOTAL HIP ARTHROPLASTY Left 2008   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    Prior to Admission medications   Medication Sig Start Date End Date Taking? Authorizing Provider  acetaminophen (TYLENOL) 325 MG tablet Take 2 tablets (650 mg total) by mouth every 6 (six) hours as needed for mild pain (or Fever >/= 101). 02/16/23  Yes Emokpae, Courage, MD  atorvastatin (LIPITOR) 80 MG tablet TAKE 1 TABLET BY MOUTH EVERY DAY 11/30/22  Yes [name], MD  Iron, Ferrous Sulfate, 325 (65 Fe) MG TABS Take 325 mg by mouth daily. 02/16/23  Yes Emokpae, Courage, MD  metoprolol succinate (TOPROL-XL) 25 MG 24 hr tablet Take 1 tablet (25 mg total) by mouth daily. 02/16/23  Yes Emokpae, Courage, MD  pantoprazole (PROTONIX) 40 MG tablet Take 1 tablet (40 mg total) by mouth 2 (two) times daily. 02/25/23 05/26/23 Yes Mahon, Frederik Schmidt, NP  Potassium Chloride ER 20 MEQ TBCR Take 1 tablet (20 mEq total) by mouth daily. 1 tab daily by mouth 02/16/23  Yes Emokpae, Courage, MD  silver sulfADIAZINE (SILVADENE) 1 % cream Apply 1 Application topically daily. 02/12/23  Yes [name], FNP  torsemide (DEMADEX) 20 MG tablet Take 2 tablets (40 mg total) by mouth 2 (two) times daily. 02/16/23 02/11/24 Yes Emokpae, Courage, MD  nitroGLYCERIN (NITROSTAT) 0.4 MG SL tablet Place 1 tablet (0.4 mg total) under the tongue every 5 (five) minutes x 3 doses as needed for chest pain (if no relief after 3rd dose, proceed to the ED for an evaluation or call 911). 09/05/22   [name], MD    Allergies as of 02/18/2023 - Review Complete 02/15/2023   Allergen Reaction Noted   Augmentin [amoxicillin-pot clavulanate] Nausea And Vomiting and Other (See Comments) 01/14/2013    Family History  Problem Relation Age of Onset   Heart attack Brother    Colon cancer Neg Hx     Social History   Socioeconomic History   Marital status: Married    Spouse name: Larita Fife   Number of children: 2   Years of education: Not on file   Highest education level: Bachelor's degree (e.g., BA, AB, BS)  Occupational History   Occupation: PT Holiday representative: RETIRED  Tobacco Use   Smoking status: Former    Packs/day: 0.50    Years: 2.00    Additional pack years: 0.00    Total pack years: 1.00    Types: Cigarettes    Quit date: 09/03/1966    Years since quitting: 56.5   Smokeless tobacco: Never  Vaping Use   Vaping Use: Never used  Substance and Sexual Activity   Alcohol use: Yes    Alcohol/week: 11.0 standard drinks of alcohol    Types: 7 Glasses of wine,  4 Cans of beer per week    Comment: a glass of wine with dinner/ beer on the weekend   Drug use: No   Sexual activity: Not on file  Other Topics Concern   Not on file  Social History Narrative   Lives with his wife.   Social Determinants of Health   Financial Resource Strain: Low Risk  (01/04/2023)   Overall Financial Resource Strain (CARDIA)    Difficulty of Paying Living Expenses: Not hard at all  Food Insecurity: No Food Insecurity (02/14/2023)   Hunger Vital Sign    Worried About Running Out of Food in the Last Year: Never true    Ran Out of Food in the Last Year: Never true  Transportation Needs: No Transportation Needs (02/14/2023)   PRAPARE - Administrator, Civil Service (Medical): No    Lack of Transportation (Non-Medical): No  Physical Activity: Unknown (11/25/2022)   Exercise Vital Sign    Days of Exercise per Week: 0 days    Minutes of Exercise per Session: Not on file  Stress: No Stress Concern Present (01/04/2023)   [employer] of  Occupational Health - Occupational Stress Questionnaire    Feeling of Stress : Not at all  Social Connections: Moderately Integrated (11/25/2022)   Social Connection and Isolation Panel [NHANES]    Frequency of Communication with Friends and Family: Once a week    Frequency of Social Gatherings with Friends and Family: Once a week    Attends Religious Services: More than 4 times per year    Active Member of [employer] or Organizations: Yes    Attends Engineer, structural: More than 4 times per year    Marital Status: Married  Catering manager Violence: Not At Risk (02/14/2023)   Humiliation, Afraid, Rape, and Kick questionnaire    Fear of Current or Ex-Partner: No    Emotionally Abused: No    Physically Abused: No    Sexually Abused: No    Review of Systems: See HPI, otherwise negative ROS  Physical Exam: Vital signs in last 24 hours: Temp:  [98.4 F (36.9 C)] 98.4 F (36.9 C) (07/10 1250) Pulse Rate:  [85] 85 (07/10 1250) Resp:  [20] 20 (07/10 1250) BP: (121)/(69) 121/69 (07/10 1250) SpO2:  [99 %] 99 % (07/10 1250)   General:   Alert,  Well-developed, well-nourished, pleasant and cooperative in NAD Head:  Normocephalic and atraumatic. Eyes:  Sclera clear, no icterus.   Conjunctiva pink. Ears:  Normal auditory acuity. Nose:  No deformity, discharge,  or lesions. Msk:  Symmetrical without gross deformities. Normal posture. Extremities:  Without clubbing or edema. Neurologic:  Alert and  oriented x4;  grossly normal neurologically. Skin:  Intact without significant lesions or rashes. Psych:  Alert and cooperative. Normal mood and affect.  Impression/Plan: Leonard Cisneros is here for a colonoscopy to be performed for iron deficiency anemia.   The risks of the procedure including infection, bleed, or perforation as well as benefits, limitations, alternatives and imponderables have been reviewed with the patient. Questions have been answered. All parties agreeable.

## 2023-03-13 NOTE — Discharge Instructions (Addendum)
  Colonoscopy Discharge Instructions  Read the instructions outlined below and refer to this sheet in the next few weeks. These discharge instructions provide you with general information on caring for yourself after you leave the hospital. Your doctor may also give you specific instructions. While your treatment has been planned according to the most current medical practices available, unavoidable complications occasionally occur.   ACTIVITY You may resume your regular activity, but move at a slower pace for the next 24 hours.  Take frequent rest periods for the next 24 hours.  Walking will help get rid of the air and reduce the bloated feeling in your belly (abdomen).  No driving for 24 hours (because of the medicine (anesthesia) used during the test).   Do not sign any important legal documents or operate any machinery for 24 hours (because of the anesthesia used during the test).  NUTRITION Drink plenty of fluids.  You may resume your normal diet as instructed by your doctor.  Begin with a light meal and progress to your normal diet. Heavy or fried foods are harder to digest and may make you feel sick to your stomach (nauseated).  Avoid alcoholic beverages for 24 hours or as instructed.  MEDICATIONS You may resume your normal medications unless your doctor tells you otherwise.  WHAT YOU CAN EXPECT TODAY Some feelings of bloating in the abdomen.  Passage of more gas than usual.  Spotting of blood in your stool or on the toilet paper.  IF YOU HAD POLYPS REMOVED DURING THE COLONOSCOPY: No aspirin products for 7 days or as instructed.  No alcohol for 7 days or as instructed.  Eat a soft diet for the next 24 hours.  FINDING OUT THE RESULTS OF YOUR TEST Not all test results are available during your visit. If your test results are not back during the visit, make an appointment with your caregiver to find out the results. Do not assume everything is normal if you have not heard from your  caregiver or the medical facility. It is important for you to follow up on all of your test results.  SEEK IMMEDIATE MEDICAL ATTENTION IF: You have more than a spotting of blood in your stool.  Your belly is swollen (abdominal distention).  You are nauseated or vomiting.  You have a temperature over 101.  You have abdominal pain or discomfort that is severe or gets worse throughout the day.   Your colonoscopy revealed 2 small polyp(s) which I removed successfully. Await pathology results, my office will contact you. I do not think you need further colonoscopy for polyp surveillance.   Resume oral iron.   Follow up in GI clinic as previously scheduled.   I hope you have a great rest of your week!  Hennie Duos. Marletta Lor, D.O. Gastroenterology and Hepatology [facility]

## 2023-03-13 NOTE — Op Note (Signed)
[facility] Patient Name: Leonard Cisneros Procedure Date: 03/13/2023 1:11 PM MRN: [id number] Date of Birth: 06/08/1941 Attending MD: Hennie Duos. Maple Mirza, [id number] CSN: [id number] Age: 82 Admit Type: Outpatient Procedure:                Colonoscopy Indications:              Iron deficiency anemia Providers:                Hennie Duos. Marletta Lor, DO, Nena Polio, RN, Zena Amos Referring MD:              Medicines:                See the Anesthesia note for documentation of the                            administered medications Complications:            No immediate complications. Estimated Blood Loss:     Estimated blood loss was minimal. Procedure:                Pre-Anesthesia Assessment:                           - The anesthesia plan was to use monitored                            anesthesia care (MAC).                           After obtaining informed consent, the colonoscope                            was passed under direct vision. Throughout the                            procedure, the patient's blood pressure, pulse, and                            oxygen saturations were monitored continuously. The                            [id number]-HQ190L ([id number]) scope was introduced through                            the anus and advanced to the the cecum, identified                            by appendiceal orifice and ileocecal valve. The                            colonoscopy was performed without difficulty. The                            patient tolerated the procedure well. The quality  of the bowel preparation was evaluated using the                            BBPS [facility] Bowel Preparation Scale) with scores                            of: Right Colon = 2 (minor amount of residual                            staining, small fragments of stool and/or opaque                            liquid, but mucosa seen well),  Transverse Colon = 2                            (minor amount of residual staining, small fragments                            of stool and/or opaque liquid, but mucosa seen                            well) and Left Colon = 2 (minor amount of residual                            staining, small fragments of stool and/or opaque                            liquid, but mucosa seen well). The total BBPS score                            equals 6. Fair. Scope In: 1:28:00 PM Scope Out: 1:49:14 PM Scope Withdrawal Time: 0 hours 18 minutes 15 seconds  Total Procedure Duration: 0 hours 21 minutes 14 seconds  Findings:      Non-bleeding internal hemorrhoids were found during endoscopy.      Two sessile polyps were found in the ascending colon and cecum. The       polyps were 3 to 5 mm in size. These polyps were removed with a cold       snare. Resection and retrieval were complete.      The exam was otherwise without abnormality. Impression:               - Non-bleeding internal hemorrhoids.                           - Two 3 to 5 mm polyps in the ascending colon and                            in the cecum, removed with a cold snare. Resected                            and retrieved.                           -  The examination was otherwise normal. Moderate Sedation:      Per Anesthesia Care Recommendation:           - Patient has a contact number available for                            emergencies. The signs and symptoms of potential                            delayed complications were discussed with the                            patient. Return to normal activities tomorrow.                            Written discharge instructions were provided to the                            patient.                           - Resume previous diet.                           - Continue present medications.                           - Await pathology results.                           - No repeat  colonoscopy due to age.                           - Return to GI clinic in 3 months. Procedure Code(s):        --- Professional ---                           [phone], Colonoscopy, flexible; with removal of                            tumor(s), polyp(s), or other lesion(s) by snare                            technique Diagnosis Code(s):        --- Professional ---                           D12.2, Benign neoplasm of ascending colon                           D12.0, Benign neoplasm of cecum                           K64.8, Other hemorrhoids                           D50.9, Iron deficiency anemia, unspecified CPT copyright 2022 American Medical  Association. All rights reserved. The codes documented in this report are preliminary and upon coder review may  be revised to meet current compliance requirements. Hennie Duos. Marletta Lor, DO Hennie Duos. Marletta Lor, DO 03/13/2023 1:52:10 PM This report has been signed electronically. Number of Addenda: 0

## 2023-03-14 ENCOUNTER — Ambulatory Visit (HOSPITAL_COMMUNITY): Payer: Medicare HMO | Admitting: Physical Therapy

## 2023-03-14 ENCOUNTER — Encounter: Payer: Self-pay | Admitting: Cardiology

## 2023-03-14 ENCOUNTER — Encounter: Payer: Self-pay | Admitting: Nurse Practitioner

## 2023-03-14 ENCOUNTER — Ambulatory Visit: Payer: Medicare HMO | Admitting: Family Medicine

## 2023-03-14 ENCOUNTER — Ambulatory Visit (INDEPENDENT_AMBULATORY_CARE_PROVIDER_SITE_OTHER): Payer: Medicare HMO | Admitting: Nurse Practitioner

## 2023-03-14 ENCOUNTER — Encounter (HOSPITAL_COMMUNITY): Payer: Self-pay | Admitting: Physical Therapy

## 2023-03-14 VITALS — BP 120/60 | HR 107 | Ht 71.0 in | Wt 190.0 lb

## 2023-03-14 DIAGNOSIS — I5032 Chronic diastolic (congestive) heart failure: Secondary | ICD-10-CM

## 2023-03-14 DIAGNOSIS — Z79899 Other long term (current) drug therapy: Secondary | ICD-10-CM

## 2023-03-14 DIAGNOSIS — I2510 Atherosclerotic heart disease of native coronary artery without angina pectoris: Secondary | ICD-10-CM | POA: Diagnosis not present

## 2023-03-14 DIAGNOSIS — I77819 Aortic ectasia, unspecified site: Secondary | ICD-10-CM | POA: Diagnosis not present

## 2023-03-14 DIAGNOSIS — E785 Hyperlipidemia, unspecified: Secondary | ICD-10-CM | POA: Diagnosis not present

## 2023-03-14 DIAGNOSIS — I872 Venous insufficiency (chronic) (peripheral): Secondary | ICD-10-CM | POA: Diagnosis not present

## 2023-03-14 DIAGNOSIS — I350 Nonrheumatic aortic (valve) stenosis: Secondary | ICD-10-CM

## 2023-03-14 DIAGNOSIS — D649 Anemia, unspecified: Secondary | ICD-10-CM | POA: Diagnosis not present

## 2023-03-14 DIAGNOSIS — Z8719 Personal history of other diseases of the digestive system: Secondary | ICD-10-CM | POA: Diagnosis not present

## 2023-03-14 DIAGNOSIS — I4821 Permanent atrial fibrillation: Secondary | ICD-10-CM

## 2023-03-14 LAB — SURGICAL PATHOLOGY

## 2023-03-14 MED ORDER — METOPROLOL SUCCINATE ER 25 MG PO TB24: 25.0000 mg | ORAL_TABLET | Freq: Two times a day (BID) | ORAL | 3 refills | Status: DC

## 2023-03-14 MED ORDER — METOPROLOL TARTRATE 25 MG PO TABS: 25.0000 mg | ORAL_TABLET | Freq: Two times a day (BID) | ORAL | 3 refills | Status: DC

## 2023-03-14 NOTE — Progress Notes (Signed)
Cardiology Office Note:  .   Date:  03/14/2023  ID:  Leonard Cisneros, DOB [birth year], MRN [id number] PCP: [name], FNP  [facility] Providers Cardiologist:  [name], MD Electrophysiologist:  Lanier Prude, MD    History of Present Illness: .   Leonard Cisneros is a 82 y.o. male with a PMH of A-flutter, s/p ablation, permanent A-fib, CAD, s/p CABG in 2003, ICM, HFpEF, HTN, HLD, hx of GI bleeding and recurrent symptomatic anemia, moderate AS, chronic venous stasis dermatitis/leg edema, who presents today for hospital follow-up.   Admitted 11/2022 for CHF exacerbation and symptomatic anemia. 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 GI recommended holding Eliquis x 3 days and then resuming. Was restarted on Demedex 80 mg daily at d/c.   Last seen by Dr. Diona Browner on December 31, 2022. Was doing well at the time. Saw Dr. Lalla Brothers on February 11, 2023 for evaluation for Watchman device implant. Dr. Lalla Brothers stated he would need to start Eliquis at 2.5 mg BID for 5 days prior to [facility] implant with plans to continue it for 45 days after implant.   Admitted 02/2023 for GI bleeding and anemia. Hgb 6.7 and was taken off Eliquis. Noted fatigue. Hx of GI bleed despite stopping Eliquis. Recommended to follow-up with GI for outpt colonoscopy. Tx for HFpEF with IV Lasix.   Underwent colonoscopy d/t anemia with Dr. Marletta Lor yesterday.  Several polps removed, tolerated procedure well.   Today he presents for hospital follow-up. States he is "doing fine." And getting back to staying active with his flower garden in the early mornings. Denies any chest pain, shortness of breath, palpitations, syncope, presyncope, dizziness, orthopnea, PND, significant weight changes, acute bleeding, or claudication. Does admit to stable swelling, bruising, and skin changes r/t to his chronic venous stasis dermatitis. Says he weighs himself every morning and his weight is stable.    Studies Reviewed: [name]    EKG Interpretation Date/Time:  Thursday March 14 2023 09:38:44 EDT Ventricular Rate:  106 PR Interval:    QRS Duration:  88 QT Interval:  372 QTC Calculation: 494 R Axis:   -67  Text Interpretation: Atrial fibrillation with rapid ventricular response Left axis deviation Nonspecific ST and T wave abnormality When compared with ECG of 14-Feb-2023 09:37, PREVIOUS ECG IS PRESENT Confirmed by [name] [id number]) on 03/14/2023 9:43:44 AM   CT Cardiac 03/07/2023:  MPRESSION: 1. There is normal pulmonary vein drainage into the left atrium.   2. The left atrial appendage is patent.   3. Watchman Flx sizing as above.   4. Aortic valve calcium score of 195.   5. Evidence of mild ascending aortic dilation, 41 mm. Consider secondary imaging modality (echocardiogram, CTA Aorta Protocol, MRA Aorta Protocol) in 12 months if clinically indicated.  Right and left heart cath on 09/27/2022:    Ost LAD to Prox LAD lesion is 100% stenosed.   Ramus lesion is 100% stenosed.   Prox RCA to Mid RCA lesion is 60% stenosed.   Dist RCA lesion is 90% stenosed.   Origin to Prox Graft lesion is 100% stenosed.   LIMA graft was visualized by angiography and is large.   RIMA graft was visualized by non-selective angiography and is normal in caliber.   The graft exhibits no disease.   The graft exhibits no disease.   LV end diastolic pressure is mildly elevated.   Hemodynamic findings consistent with mild pulmonary hypertension.   There is moderate aortic valve stenosis.  3 vessel occlusive CAD. The native LCx is widely patent LIMA to the LAD is patent RIMA to the PDA is patent SVG to the ramus intermediate is occluded. This appears to be chronic Mild to Moderate aortic stenosis. AV mean gradient 18.5 mm Hg. AVA 2.5 cm squared with index 1.15. Elevated LV filling pressures. LVEDP 20 mm Hg. Mean PCWP 24 mm Hg with large V waves to 42 mm Hg Mild pulmonary HTN. PAP 56/17 with mean  31 mm Hg.    Plan: medical management. Continue diuresis. Would benefit greatly from correction of severe anemia. Will need to monitor Aortic stenosis serially going forward.    TTE 09/25/2022:   1. Left ventricular ejection fraction, by estimation, is 55 to 60%. The  left ventricle has normal function. The left ventricle has no regional  wall motion abnormalities. There is moderate concentric left ventricular  hypertrophy. Left ventricular  diastolic parameters are indeterminate.   2. Right ventricular systolic function is normal. The right ventricular  size is mildly enlarged. Tricuspid regurgitation signal is inadequate for  assessing PA pressure.   3. Left atrial size was severely dilated.   4. Right atrial size was mildly dilated.   5. Left pleural effusion noted.   6. The mitral valve is degenerative. Mild mitral valve regurgitation.  Severe mitral annular calcification.   7. The aortic valve is tricuspid. There is moderate calcification of the  aortic valve. Aortic valve regurgitation is not visualized. Moderate to  severe aortic valve stenosis. Aortic valve mean gradient measures 24.0  mmHg. Dimentionless index 0.34.   8. Aortic dilatation noted. There is mild dilatation of the ascending  aorta, measuring 39 mm.   9. The inferior vena cava is dilated in size with <50% respiratory  variability, suggesting right atrial pressure of 15 mmHg.   Comparison(s): Prior images reviewed side by side. LVEF 55-60% range.  Degenerative, calcific aortic stenosis of moderate to severe range with  similar gradients as prior study.   Myoview on November 12, 2021: IMPRESSION: 1. No reversible ischemia or infarction.   2. Normal left ventricular wall motion.   3. Left ventricular ejection fraction 62%   4. Non invasive risk stratification*: Low   Risk Assessment/Calculations:    CHA2DS2-VASc Score = 5   This indicates a 7.2% annual risk of stroke. The patient's score is based upon: CHF  History: 1 HTN History: 1 Diabetes History: 0 Stroke History: 0 Vascular Disease History: 1 Age Score: 2 Gender Score: 0      Physical Exam:   VS:  BP 120/60   Pulse (!) 107   Ht 5\' 11"  (1.803 m)   Wt 190 lb (86.2 kg)   SpO2 94%   BMI 26.50 kg/m    Wt Readings from Last 3 Encounters:  03/14/23 190 lb (86.2 kg)  03/05/23 188 lb 1.3 oz (85.3 kg)  02/25/23 188 lb 1.3 oz (85.3 kg)    GEN: Well nourished, well developed in no acute distress NECK: No JVD; No carotid bruits CARDIAC: S1/S2, irregular rhythm and regular rate, Grade 3/6 systolic murmur, no rubs no gallops RESPIRATORY:  Clear to auscultation without rales, wheezing or rhonchi  ABDOMEN: Soft, non-tender, non-distended EXTREMITIES:  scattered bruising, discolored skin, nonpitting edema along BLE (chronic venous stasis dermatitis along BLE with scattered wounds along BLE)  ASSESSMENT AND PLAN: .    Permanent A-fib Denies any tachycardia or palpitations. HR slightly elevated today. Stated he has not heard back regarding evaluation regarding the Watchman device. Denies  any bleeding issues. Will stop Toprol XL and start Lopressor 25 mg BID. Currently not on OAC d/t hx of GI bleeding and recurrent symptomatic anemia. Unsure if patient will be able to tolerate short term OAC d/t hx of bleeding. Will obtain CBC and BMET.   HFpEF, medication management Stage C, NYHA class I-II symptoms. EF 55-60%. Weight is stable per his report, but does show signs of leg edema. Continue Torsemide and potassium supplement. Will obtain proBNP, BMET, and CBC in 1 week. If kidney function permits, plan to temporarily increase Torsemide. Stopping Toprol XL and switching to Lopressor 25 mg BID. Not a candidate for SGLT2i d/t chronic leg wounds, Aldactone previously caused intermittent hypotension. Low sodium diet, fluid restriction <2L, and daily weights encouraged. Educated to contact our office for weight gain of 2 lbs overnight or 5 lbs in one  week.  CAD, s/p CABG, HLD Stable with no anginal symptoms. No indication for ischemic evaluation. Will stop Toprol XL and begin Lopressor 25 mg BID. Last LDL 31. Continue atorvastatin and NTG PRN. Heart healthy diet and regular cardiovascular exercise encouraged. ED precautions discussed.   Aortic valve stenosis, aortic dilatation TTE 09/2022 revealed moderate to severe AS with mean gradient at 24.0 mmHg with mild dilatation of ascending aorta at 39 mm noted. He is scheduled for upcoming Echo prior to scheduled office visit with Dr. Diona Browner. Will continue to monitor.  Hx of GI bleed and recurrent symptomatic anemia Most recent Hgb 7.5. Denies any bleeding issues. Hx of GI bleed. Will obtain CBC as mentioned above. Continue to follow-up with GI.   Chronic venous stasis dermatitis Does exhibit signs of healing ulcers with one current wound UTA covered with bandage along RLE. Will refer patient to Wound Care Clinic in [place] for further management. He verbalizes understanding.   Dispo: Follow-up with Dr. Diona Browner as scheduled.   Signed, [name], NP

## 2023-03-14 NOTE — Patient Instructions (Addendum)
Medication Instructions:  Your physician has recommended you make the following change in your medication:  Stop taking metoprolol Succinate  Start taking Metoprolol tartrate 25 Mg 2 times daily Continue all other medications as prescribed   Labwork: CBC, BNP, BMET at [facility] within 1 week   Testing/Procedures:   Follow-Up: Your physician recommends that you schedule a follow-up appointment in: Continue to follow up with Dr.McDowell as planned  Any Other Special Instructions Will Be Listed Below (If Applicable). Referral sent to wound clinic in [place].   If you need a refill on your cardiac medications before your next appointment, please call your pharmacy.

## 2023-03-14 NOTE — Therapy (Signed)
PHYSICAL THERAPY DISCHARGE SUMMARY  Visits from Start of Care: 2  Current functional level related to goals / functional outcomes: Pt continues to have weakness and wounds    Remaining deficits: Wounds and weakness   Education / Equipment: HEP   Patient agrees to discharge. Patient goals were not met. Patient is being discharged due to the patient's request.  Virgina Organ, PT CLT [phone]

## 2023-03-15 ENCOUNTER — Other Ambulatory Visit (HOSPITAL_COMMUNITY)
Admission: RE | Admit: 2023-03-15 | Discharge: 2023-03-15 | Disposition: A | Discharge status: Home / Self Care | Payer: Medicare HMO | Source: Ambulatory Visit | Attending: Nurse Practitioner | Admitting: Nurse Practitioner

## 2023-03-15 ENCOUNTER — Ambulatory Visit: Payer: Self-pay | Admitting: *Deleted

## 2023-03-15 DIAGNOSIS — Z79899 Other long term (current) drug therapy: Secondary | ICD-10-CM | POA: Diagnosis not present

## 2023-03-15 DIAGNOSIS — D649 Anemia, unspecified: Secondary | ICD-10-CM | POA: Insufficient documentation

## 2023-03-15 DIAGNOSIS — R5383 Other fatigue: Secondary | ICD-10-CM | POA: Insufficient documentation

## 2023-03-15 LAB — BASIC METABOLIC PANEL
Anion gap: 10 (ref 5–15)
BUN: 12 mg/dL (ref 8–23)
CO2: 23 mmol/L (ref 22–32)
Calcium: 7.9 mg/dL — ABNORMAL LOW (ref 8.9–10.3)
Chloride: 97 mmol/L — ABNORMAL LOW (ref 98–111)
Creatinine, Ser: 1.25 mg/dL — ABNORMAL HIGH (ref 0.61–1.24)
GFR, Estimated: 58 mL/min — ABNORMAL LOW (ref >60)
Glucose, Bld: 96 mg/dL (ref 70–99)
Potassium: 3.3 mmol/L — ABNORMAL LOW (ref 3.5–5.1)
Sodium: 130 mmol/L — ABNORMAL LOW (ref 135–145)

## 2023-03-15 LAB — BRAIN NATRIURETIC PEPTIDE: B Natriuretic Peptide: 762 pg/mL — ABNORMAL HIGH (ref 0.0–100.0)

## 2023-03-15 LAB — CBC
HCT: 23.8 % — ABNORMAL LOW (ref 39.0–52.0)
Hemoglobin: 7.7 g/dL — ABNORMAL LOW (ref 13.0–17.0)
MCH: 36.7 pg — ABNORMAL HIGH (ref 26.0–34.0)
MCHC: 32.4 g/dL (ref 30.0–36.0)
MCV: 113.3 fL — ABNORMAL HIGH (ref 80.0–100.0)
Platelets: 207 10*3/uL (ref 150–400)
RBC: 2.1 MIL/uL — ABNORMAL LOW (ref 4.22–5.81)
RDW: 23.7 % — ABNORMAL HIGH (ref 11.5–15.5)
WBC: 7.8 10*3/uL (ref 4.0–10.5)
nRBC: 0.3 % — ABNORMAL HIGH (ref 0.0–0.2)

## 2023-03-15 NOTE — Patient Instructions (Addendum)
Visit Information  Thank you for taking time to visit with me today. Please don't hesitate to contact me if I can be of assistance to you.   Following are the goals we discussed today:   Goals Addressed             This Visit's Progress    THn care coordination services   On track    Interventions Today    Flowsheet Row Most Recent Value  Chronic Disease   Chronic disease during today's visit Congestive Heart Failure (CHF), Atrial Fibrillation (AFib), Other, Hypertension (HTN)  [Right lower extremity wound care]  General Interventions   General Interventions Discussed/Reviewed General Interventions Reviewed, Annual Foot Exam, Doctor Visits  Doctor Visits Discussed/Reviewed Doctor Visits Reviewed, PCP, Specialist  [confirmed with him he "fired them" & is to get another wound care provider that will not hurt hime]  PCP/Specialist Visits Compliance with follow-up visit  [encouraged]  Exercise Interventions   Exercise Discussed/Reviewed Exercise Reviewed, Physical Activity  Physical Activity Discussed/Reviewed Physical Activity Reviewed, Home Exercise Program (HEP)  Education Interventions   Education Provided Provided Education  [discussed wound care, debridment process to allow wounds to heal form inside out]  Provided Verbal Education On Other  [wound care debridement benefits]  Mental Health Interventions   Mental Health Discussed/Reviewed Mental Health Reviewed, Coping Strategies              Our next appointment is by telephone on 05/31/23 at 1 pm  Please call the care guide team at [phone] if you need to cancel or reschedule your appointment.   If you are experiencing a Mental Health or Behavioral Health Crisis or need someone to talk to, please call the Suicide and Crisis Lifeline: 988 call the [place] National Suicide Prevention Lifeline: 219-224-5346 or TTY: 848-638-7673 TTY 339-105-9728) to talk to a trained counselor call 1-800-273-TALK (toll free, 24 hour  hotline) call the [facility]: [phone] call 911   Patient verbalizes understanding of instructions and care plan provided today and agrees to view in MyChart. Active MyChart status and patient understanding of how to access instructions and care plan via MyChart confirmed with patient.     The patient has been provided with contact information for the care management team and has been advised to call with any health related questions or concerns.   Oliveah Zwack L. Noelle Penner, RN, BSN, CCM [facility] Care Management Community Coordinator Office number [phone]

## 2023-03-15 NOTE — Patient Outreach (Signed)
  Care Coordination   Follow Up Visit Note   03/15/2023 Name: Leonard Cisneros MRN: [id number] DOB: 07-11-1941  Leonard Cisneros is a 82 y.o. year old male who sees [name], FNP for primary care. I spoke with  Leonard Cisneros by phone today. He returned a call to RN CM  What matters to the patients health and wellness today?  Colonoscopy done on 03/13/23 Denies any concerns "The exam was otherwise without abnormality. Impression: - Non- bleeding internal hemorrhoids. - Two 3 to 5 mm polyps in the ascending colon and in the cecum, removed with a cold snare. Resected and retrieved." To follow up with GI in 3 months pending pathology  Right lower extremity leg wound  Wound care need new wound care provider needed. His visit to the other one was "painful" Wife continues to care for the wound no redness, drainage nor pain per patient   He confirms he is doing well "you don't have to worry about me"  He reports he and his wife are heading to the beach for a few weeks   Goals Addressed             This Visit's Progress    THn care coordination services   On track    Interventions Today    Flowsheet Row Most Recent Value  Chronic Disease   Chronic disease during today's visit Congestive Heart Failure (CHF), Atrial Fibrillation (AFib), Other, Hypertension (HTN)  [Right lower extremity wound care]  General Interventions   General Interventions Discussed/Reviewed General Interventions Reviewed, Annual Foot Exam, Doctor Visits  Doctor Visits Discussed/Reviewed Doctor Visits Reviewed, PCP, Specialist  [confirmed with him he "fired them" & is to get another wound care provider that will not hurt hime]  PCP/Specialist Visits Compliance with follow-up visit  [encouraged]  Exercise Interventions   Exercise Discussed/Reviewed Exercise Reviewed, Physical Activity  Physical Activity Discussed/Reviewed Physical Activity Reviewed, Home Exercise Program (HEP)  Education Interventions    Education Provided Provided Education  [discussed wound care, debridment process to allow wounds to heal form inside out]  Provided Verbal Education On Other  [wound care debridement benefits]  Mental Health Interventions   Mental Health Discussed/Reviewed Mental Health Reviewed, Coping Strategies              SDOH assessments and interventions completed:  No     Care Coordination Interventions:  Yes, provided   Follow up plan: Follow up call scheduled for 05/31/23    Encounter Outcome:  Pt. Visit Completed   Becca Bayne L. Noelle Penner, RN, BSN, CCM [facility] Care Management Community Coordinator Office number [phone]

## 2023-03-15 NOTE — Telephone Encounter (Signed)
err

## 2023-03-15 NOTE — Patient Outreach (Signed)
  Care Coordination   03/15/2023 Name: Leonard Cisneros MRN: [id number] DOB: 20-Jul-1941   Care Coordination Outreach Attempts:  An unsuccessful telephone outreach was attempted for a scheduled appointment today.  Follow Up Plan:  Additional outreach attempts will be made to offer the patient care coordination information and services.   Encounter Outcome:  No Answer   Care Coordination Interventions:  No, not indicated     Dakin Madani L. Noelle Penner, RN, BSN, CCM [facility] Care Management Community Coordinator Office number [phone]

## 2023-03-19 ENCOUNTER — Encounter (HOSPITAL_COMMUNITY): Payer: Self-pay | Admitting: Internal Medicine

## 2023-03-21 ENCOUNTER — Telehealth: Payer: Self-pay | Admitting: Cardiology

## 2023-03-21 NOTE — Telephone Encounter (Signed)
Attempted to call the patient to offer LAAO dates. He was seen in consult with Dr. Lalla Brothers 6/10 and unfortunately he was re-hospitalized with recurrent GI bleed 6/12-6/15. Hgb 6.7 and was taken off Eliquis. Underwent colonoscopy 7/10 d/t anemia with Dr. Marletta Lor with several polps removed. Last Hb 7.7 at follow up with cardiology 7/11. Follows back with GI 8/7. I have sent Dr. Lalla Brothers a message to make him aware and have attempted to call the patient to discuss plan with no answer and no VM availability.   Georgie Chard NP-C Structural Heart Team  Pager: [phone] Phone: [phone]

## 2023-03-22 ENCOUNTER — Telehealth: Payer: Self-pay | Admitting: Cardiology

## 2023-03-22 NOTE — Telephone Encounter (Signed)
Second attempt at reaching the patient in regards to [facility] scheduling. See prior telephone note. No availability to leave VM  Georgie Chard NP-C Structural Heart Team  Pager: [phone] Phone: [phone]

## 2023-03-26 ENCOUNTER — Telehealth: Payer: Self-pay | Admitting: Cardiology

## 2023-03-26 NOTE — Telephone Encounter (Signed)
Unable to reach patient at this time to schedule Watchman consultation. Case discussed with Dr. Lalla Brothers with plan to follow the patient after he is seen by GI. If Hb noted to be stable with no acute issues at that time, will plan to proceed with consultation if patient agreeable. Appears the patient has an upcoming echo 8/2 and follow up with Dr. Diona Browner 8/8. He will see GI 8/7.    Georgie Chard NP-C Structural Heart Team  Phone: [phone]

## 2023-03-28 ENCOUNTER — Telehealth: Payer: Self-pay | Admitting: Cardiology

## 2023-03-28 NOTE — Telephone Encounter (Signed)
Contacted the patient and wife today regarding recurrent GI bleeding after being seen in [place] consultation. He follows with GI on

## 2023-03-28 NOTE — Telephone Encounter (Signed)
8/7. I have reached out to their team as he will require short term AC at least one week prior and 6 weeks after implant. We will follow along with their recommendations on this. Patient and wife aware and agreeable.   Georgie Chard NP-C Structural Heart Team  Pager: [phone] Phone: [phone]

## 2023-03-31 ENCOUNTER — Other Ambulatory Visit: Payer: Self-pay

## 2023-03-31 ENCOUNTER — Emergency Department (HOSPITAL_COMMUNITY): Payer: Medicare HMO

## 2023-03-31 ENCOUNTER — Encounter (HOSPITAL_COMMUNITY): Payer: Self-pay

## 2023-03-31 ENCOUNTER — Inpatient Hospital Stay (HOSPITAL_COMMUNITY)
Admission: EM | Admit: 2023-03-31 | Discharge: 2023-04-14 | DRG: 280 | Disposition: A | Discharge status: Home / Self Care | Payer: Medicare HMO | Attending: Internal Medicine | Admitting: Internal Medicine

## 2023-03-31 DIAGNOSIS — I350 Nonrheumatic aortic (valve) stenosis: Secondary | ICD-10-CM | POA: Diagnosis present

## 2023-03-31 DIAGNOSIS — D62 Acute posthemorrhagic anemia: Secondary | ICD-10-CM | POA: Diagnosis not present

## 2023-03-31 DIAGNOSIS — D469 Myelodysplastic syndrome, unspecified: Secondary | ICD-10-CM | POA: Diagnosis present

## 2023-03-31 DIAGNOSIS — D638 Anemia in other chronic diseases classified elsewhere: Secondary | ICD-10-CM | POA: Diagnosis present

## 2023-03-31 DIAGNOSIS — I83018 Varicose veins of right lower extremity with ulcer other part of lower leg: Secondary | ICD-10-CM | POA: Diagnosis not present

## 2023-03-31 DIAGNOSIS — E8809 Other disorders of plasma-protein metabolism, not elsewhere classified: Secondary | ICD-10-CM | POA: Diagnosis present

## 2023-03-31 DIAGNOSIS — I5082 Biventricular heart failure: Secondary | ICD-10-CM | POA: Diagnosis present

## 2023-03-31 DIAGNOSIS — Z1152 Encounter for screening for COVID-19: Secondary | ICD-10-CM | POA: Diagnosis not present

## 2023-03-31 DIAGNOSIS — E871 Hypo-osmolality and hyponatremia: Secondary | ICD-10-CM | POA: Diagnosis not present

## 2023-03-31 DIAGNOSIS — I5023 Acute on chronic systolic (congestive) heart failure: Secondary | ICD-10-CM | POA: Diagnosis not present

## 2023-03-31 DIAGNOSIS — Z881 Allergy status to other antibiotic agents status: Secondary | ICD-10-CM

## 2023-03-31 DIAGNOSIS — K922 Gastrointestinal hemorrhage, unspecified: Secondary | ICD-10-CM | POA: Diagnosis not present

## 2023-03-31 DIAGNOSIS — I4891 Unspecified atrial fibrillation: Secondary | ICD-10-CM | POA: Diagnosis not present

## 2023-03-31 DIAGNOSIS — Z8249 Family history of ischemic heart disease and other diseases of the circulatory system: Secondary | ICD-10-CM

## 2023-03-31 DIAGNOSIS — I83028 Varicose veins of left lower extremity with ulcer other part of lower leg: Secondary | ICD-10-CM | POA: Diagnosis present

## 2023-03-31 DIAGNOSIS — I959 Hypotension, unspecified: Secondary | ICD-10-CM | POA: Diagnosis not present

## 2023-03-31 DIAGNOSIS — L97829 Non-pressure chronic ulcer of other part of left lower leg with unspecified severity: Secondary | ICD-10-CM | POA: Diagnosis present

## 2023-03-31 DIAGNOSIS — I21A1 Myocardial infarction type 2: Secondary | ICD-10-CM | POA: Diagnosis present

## 2023-03-31 DIAGNOSIS — R0989 Other specified symptoms and signs involving the circulatory and respiratory systems: Secondary | ICD-10-CM | POA: Diagnosis not present

## 2023-03-31 DIAGNOSIS — I482 Chronic atrial fibrillation, unspecified: Secondary | ICD-10-CM | POA: Diagnosis not present

## 2023-03-31 DIAGNOSIS — I110 Hypertensive heart disease with heart failure: Principal | ICD-10-CM | POA: Diagnosis present

## 2023-03-31 DIAGNOSIS — I272 Pulmonary hypertension, unspecified: Secondary | ICD-10-CM | POA: Diagnosis present

## 2023-03-31 DIAGNOSIS — Z5309 Procedure and treatment not carried out because of other contraindication: Secondary | ICD-10-CM | POA: Diagnosis not present

## 2023-03-31 DIAGNOSIS — E876 Hypokalemia: Secondary | ICD-10-CM | POA: Diagnosis not present

## 2023-03-31 DIAGNOSIS — I5043 Acute on chronic combined systolic (congestive) and diastolic (congestive) heart failure: Secondary | ICD-10-CM | POA: Diagnosis not present

## 2023-03-31 DIAGNOSIS — Z955 Presence of coronary angioplasty implant and graft: Secondary | ICD-10-CM

## 2023-03-31 DIAGNOSIS — I2489 Other forms of acute ischemic heart disease: Secondary | ICD-10-CM | POA: Diagnosis not present

## 2023-03-31 DIAGNOSIS — I2510 Atherosclerotic heart disease of native coronary artery without angina pectoris: Secondary | ICD-10-CM | POA: Diagnosis not present

## 2023-03-31 DIAGNOSIS — I517 Cardiomegaly: Secondary | ICD-10-CM | POA: Diagnosis not present

## 2023-03-31 DIAGNOSIS — Z8719 Personal history of other diseases of the digestive system: Secondary | ICD-10-CM

## 2023-03-31 DIAGNOSIS — I10 Essential (primary) hypertension: Secondary | ICD-10-CM | POA: Diagnosis not present

## 2023-03-31 DIAGNOSIS — D649 Anemia, unspecified: Secondary | ICD-10-CM | POA: Diagnosis not present

## 2023-03-31 DIAGNOSIS — D509 Iron deficiency anemia, unspecified: Secondary | ICD-10-CM | POA: Diagnosis not present

## 2023-03-31 DIAGNOSIS — N179 Acute kidney failure, unspecified: Secondary | ICD-10-CM | POA: Diagnosis not present

## 2023-03-31 DIAGNOSIS — Z880 Allergy status to penicillin: Secondary | ICD-10-CM

## 2023-03-31 DIAGNOSIS — R195 Other fecal abnormalities: Secondary | ICD-10-CM | POA: Diagnosis not present

## 2023-03-31 DIAGNOSIS — Z96643 Presence of artificial hip joint, bilateral: Secondary | ICD-10-CM | POA: Diagnosis present

## 2023-03-31 DIAGNOSIS — L97819 Non-pressure chronic ulcer of other part of right lower leg with unspecified severity: Secondary | ICD-10-CM | POA: Diagnosis not present

## 2023-03-31 DIAGNOSIS — I2583 Coronary atherosclerosis due to lipid rich plaque: Secondary | ICD-10-CM | POA: Diagnosis not present

## 2023-03-31 DIAGNOSIS — I4811 Longstanding persistent atrial fibrillation: Secondary | ICD-10-CM | POA: Diagnosis not present

## 2023-03-31 DIAGNOSIS — E785 Hyperlipidemia, unspecified: Secondary | ICD-10-CM | POA: Diagnosis not present

## 2023-03-31 DIAGNOSIS — E7849 Other hyperlipidemia: Secondary | ICD-10-CM | POA: Diagnosis not present

## 2023-03-31 DIAGNOSIS — R17 Unspecified jaundice: Secondary | ICD-10-CM | POA: Diagnosis not present

## 2023-03-31 DIAGNOSIS — D539 Nutritional anemia, unspecified: Secondary | ICD-10-CM | POA: Diagnosis present

## 2023-03-31 DIAGNOSIS — I4821 Permanent atrial fibrillation: Secondary | ICD-10-CM | POA: Diagnosis present

## 2023-03-31 DIAGNOSIS — I872 Venous insufficiency (chronic) (peripheral): Secondary | ICD-10-CM | POA: Diagnosis present

## 2023-03-31 DIAGNOSIS — Z87891 Personal history of nicotine dependence: Secondary | ICD-10-CM

## 2023-03-31 DIAGNOSIS — Z95818 Presence of other cardiac implants and grafts: Secondary | ICD-10-CM

## 2023-03-31 DIAGNOSIS — Z79899 Other long term (current) drug therapy: Secondary | ICD-10-CM

## 2023-03-31 DIAGNOSIS — T502X5A Adverse effect of carbonic-anhydrase inhibitors, benzothiadiazides and other diuretics, initial encounter: Secondary | ICD-10-CM | POA: Diagnosis not present

## 2023-03-31 DIAGNOSIS — I255 Ischemic cardiomyopathy: Secondary | ICD-10-CM | POA: Diagnosis present

## 2023-03-31 DIAGNOSIS — I5021 Acute systolic (congestive) heart failure: Secondary | ICD-10-CM | POA: Diagnosis not present

## 2023-03-31 DIAGNOSIS — J90 Pleural effusion, not elsewhere classified: Secondary | ICD-10-CM | POA: Diagnosis not present

## 2023-03-31 LAB — TYPE AND SCREEN
ABO/RH(D): O POS
ABO/RH(D): O POS
Antibody Screen: POSITIVE
Antibody Screen: POSITIVE
DAT, IgG: NEGATIVE
Unit division: 0
Unit division: 0
Unit division: 0
Unit division: 0
Unit division: 0
Unit division: 0

## 2023-03-31 LAB — BASIC METABOLIC PANEL
Anion gap: 11 (ref 5–15)
BUN: 34 mg/dL — ABNORMAL HIGH (ref 8–23)
CO2: 23 mmol/L (ref 22–32)
Calcium: 7.7 mg/dL — ABNORMAL LOW (ref 8.9–10.3)
Chloride: 101 mmol/L (ref 98–111)
Creatinine, Ser: 1.74 mg/dL — ABNORMAL HIGH (ref 0.61–1.24)
GFR, Estimated: 39 mL/min — ABNORMAL LOW (ref >60)
Glucose, Bld: 108 mg/dL — ABNORMAL HIGH (ref 70–99)
Potassium: 3.9 mmol/L (ref 3.5–5.1)
Sodium: 135 mmol/L (ref 135–145)

## 2023-03-31 LAB — PROTIME-INR
INR: 1.3 — ABNORMAL HIGH (ref 0.8–1.2)
Prothrombin Time: 16.7 seconds — ABNORMAL HIGH (ref 11.4–15.2)

## 2023-03-31 LAB — CULTURE, BLOOD (ROUTINE X 2): Special Requests: ADEQUATE

## 2023-03-31 LAB — CBC
HCT: 25.2 % — ABNORMAL LOW (ref 39.0–52.0)
Hemoglobin: 8.4 g/dL — ABNORMAL LOW (ref 13.0–17.0)
MCH: 34.9 pg — ABNORMAL HIGH (ref 26.0–34.0)
MCHC: 33.3 g/dL (ref 30.0–36.0)
MCV: 104.6 fL — ABNORMAL HIGH (ref 80.0–100.0)
Platelets: 182 10*3/uL (ref 150–400)
RBC: 2.41 MIL/uL — ABNORMAL LOW (ref 4.22–5.81)
WBC: 12.9 10*3/uL — ABNORMAL HIGH (ref 4.0–10.5)
nRBC: 0.2 % (ref 0.0–0.2)

## 2023-03-31 LAB — CBC WITH DIFFERENTIAL/PLATELET
Abs Immature Granulocytes: 0.2 10*3/uL — ABNORMAL HIGH (ref 0.00–0.07)
Abs Immature Granulocytes: 0.11 10*3/uL — ABNORMAL HIGH (ref 0.00–0.07)
Band Neutrophils: 23 %
Basophils Absolute: 0 10*3/uL (ref 0.0–0.1)
Basophils Absolute: 0 10*3/uL (ref 0.0–0.1)
Basophils Relative: 0 %
Basophils Relative: 0 %
Eosinophils Absolute: 0 10*3/uL (ref 0.0–0.5)
Eosinophils Absolute: 0.2 10*3/uL (ref 0.0–0.5)
Eosinophils Relative: 0 %
Eosinophils Relative: 1 %
HCT: 19.3 % — ABNORMAL LOW (ref 39.0–52.0)
HCT: 27.4 % — ABNORMAL LOW (ref 39.0–52.0)
Hemoglobin: 6.2 g/dL — CL (ref 13.0–17.0)
Hemoglobin: 8.4 g/dL — ABNORMAL LOW (ref 13.0–17.0)
Immature Granulocytes: 1 %
Lymphocytes Relative: 11 %
Lymphocytes Relative: 8 %
Lymphs Abs: 1.3 10*3/uL (ref 0.7–4.0)
Lymphs Abs: 0.9 10*3/uL (ref 0.7–4.0)
MCH: 37.3 pg — ABNORMAL HIGH (ref 26.0–34.0)
MCH: 33.9 pg (ref 26.0–34.0)
MCHC: 32.1 g/dL (ref 30.0–36.0)
MCHC: 30.7 g/dL (ref 30.0–36.0)
MCV: 116.3 fL — ABNORMAL HIGH (ref 80.0–100.0)
MCV: 110.5 fL — ABNORMAL HIGH (ref 80.0–100.0)
Metamyelocytes Relative: 2 %
Monocytes Absolute: 1.2 10*3/uL — ABNORMAL HIGH (ref 0.1–1.0)
Monocytes Absolute: 1.1 10*3/uL — ABNORMAL HIGH (ref 0.1–1.0)
Monocytes Relative: 10 %
Monocytes Relative: 9 %
Neutro Abs: 9.2 10*3/uL — ABNORMAL HIGH (ref 1.7–7.7)
Neutro Abs: 10 10*3/uL — ABNORMAL HIGH (ref 1.7–7.7)
Neutrophils Relative %: 54 %
Neutrophils Relative %: 81 %
Platelets: 191 10*3/uL (ref 150–400)
Platelets: 199 10*3/uL (ref 150–400)
RBC: 1.66 MIL/uL — ABNORMAL LOW (ref 4.22–5.81)
RBC: 2.48 MIL/uL — ABNORMAL LOW (ref 4.22–5.81)
RDW: 25 % — ABNORMAL HIGH (ref 11.5–15.5)
RDW: 28.1 % — ABNORMAL HIGH (ref 11.5–15.5)
Smear Review: NORMAL
WBC Morphology: INCREASED
WBC: 11.9 10*3/uL — ABNORMAL HIGH (ref 4.0–10.5)
WBC: 12.3 10*3/uL — ABNORMAL HIGH (ref 4.0–10.5)
nRBC: 0.3 % — ABNORMAL HIGH (ref 0.0–0.2)
nRBC: 0.2 % (ref 0.0–0.2)

## 2023-03-31 LAB — COMPREHENSIVE METABOLIC PANEL
ALT: 41 U/L (ref 0–44)
AST: 67 U/L — ABNORMAL HIGH (ref 15–41)
Albumin: 2.5 g/dL — ABNORMAL LOW (ref 3.5–5.0)
Alkaline Phosphatase: 284 U/L — ABNORMAL HIGH (ref 38–126)
Anion gap: 7 (ref 5–15)
BUN: 38 mg/dL — ABNORMAL HIGH (ref 8–23)
CO2: 25 mmol/L (ref 22–32)
Calcium: 7.8 mg/dL — ABNORMAL LOW (ref 8.9–10.3)
Chloride: 98 mmol/L (ref 98–111)
Creatinine, Ser: 1.94 mg/dL — ABNORMAL HIGH (ref 0.61–1.24)
GFR, Estimated: 34 mL/min — ABNORMAL LOW (ref >60)
Glucose, Bld: 115 mg/dL — ABNORMAL HIGH (ref 70–99)
Potassium: 4.1 mmol/L (ref 3.5–5.1)
Sodium: 130 mmol/L — ABNORMAL LOW (ref 135–145)
Total Bilirubin: 3.5 mg/dL — ABNORMAL HIGH (ref 0.3–1.2)
Total Protein: 5.8 g/dL — ABNORMAL LOW (ref 6.5–8.1)

## 2023-03-31 LAB — BPAM RBC
Blood Product Expiration Date: 202408222359
Blood Product Expiration Date: 202408232359
Blood Product Expiration Date: 202408292359
Blood Product Expiration Date: 202408302359
Blood Product Expiration Date: 202408302359
Blood Product Expiration Date: 202408302359
ISSUE DATE / TIME: 202407281030
ISSUE DATE / TIME: 202407281243
Unit Type and Rh: 9500
Unit Type and Rh: 9500
Unit Type and Rh: 5100
Unit Type and Rh: 5100
Unit Type and Rh: 5100
Unit Type and Rh: 5100

## 2023-03-31 LAB — RESP PANEL BY RT-PCR (RSV, FLU A&B, COVID)  RVPGX2
Influenza A by PCR: NEGATIVE
Influenza B by PCR: NEGATIVE
Resp Syncytial Virus by PCR: NEGATIVE
SARS Coronavirus 2 by RT PCR: NEGATIVE

## 2023-03-31 LAB — MRSA NEXT GEN BY PCR, NASAL: MRSA by PCR Next Gen: NOT DETECTED

## 2023-03-31 LAB — BRAIN NATRIURETIC PEPTIDE: B Natriuretic Peptide: 1121 pg/mL — ABNORMAL HIGH (ref 0.0–100.0)

## 2023-03-31 LAB — TROPONIN I (HIGH SENSITIVITY)
Troponin I (High Sensitivity): 2150 ng/L (ref <18)
Troponin I (High Sensitivity): 1742 ng/L (ref <18)
Troponin I (High Sensitivity): 2178 ng/L (ref <18)

## 2023-03-31 LAB — LACTIC ACID, PLASMA
Lactic Acid, Venous: 3.1 mmol/L (ref 0.5–1.9)
Lactic Acid, Venous: 2.5 mmol/L (ref 0.5–1.9)

## 2023-03-31 LAB — RETICULOCYTES
Immature Retic Fract: 15.5 % (ref 2.3–15.9)
RBC.: 2.49 MIL/uL — ABNORMAL LOW (ref 4.22–5.81)
Retic Count, Absolute: 52.3 10*3/uL (ref 19.0–186.0)
Retic Ct Pct: 2.1 % (ref 0.4–3.1)

## 2023-03-31 LAB — FERRITIN: Ferritin: 483 ng/mL — ABNORMAL HIGH (ref 24–336)

## 2023-03-31 LAB — PREPARE RBC (CROSSMATCH)

## 2023-03-31 LAB — MAGNESIUM: Magnesium: 1.8 mg/dL (ref 1.7–2.4)

## 2023-03-31 LAB — GLUCOSE, CAPILLARY: Glucose-Capillary: 94 mg/dL (ref 70–99)

## 2023-03-31 LAB — POC OCCULT BLOOD, ED: Fecal Occult Blood: POSITIVE — AB

## 2023-03-31 LAB — APTT: aPTT: 32 seconds (ref 24–36)

## 2023-03-31 MED ORDER — METOPROLOL TARTRATE 25 MG PO TABS: 25.0000 mg | ORAL_TABLET | Freq: Two times a day (BID) | ORAL | Status: DC

## 2023-03-31 MED ORDER — PANTOPRAZOLE SODIUM 40 MG IV SOLR
40.0000 mg | Freq: Once | INTRAVENOUS | Status: AC
  Administered 2023-03-31: 40 mg via INTRAVENOUS
  Filled 2023-03-31: qty 10

## 2023-03-31 MED ORDER — NOREPINEPHRINE 4 MG/250ML-% IV SOLN
0.0000 ug/min | INTRAVENOUS | Status: DC
  Administered 2023-04-01: 4 ug/min via INTRAVENOUS
  Filled 2023-03-31: qty 250

## 2023-03-31 MED ORDER — SODIUM CHLORIDE 0.9 % IV BOLUS
1000.0000 mL | Freq: Once | INTRAVENOUS | Status: AC
  Administered 2023-03-31: 1000 mL via INTRAVENOUS

## 2023-03-31 MED ORDER — DOCUSATE SODIUM 100 MG PO CAPS
100.0000 mg | ORAL_CAPSULE | Freq: Two times a day (BID) | ORAL | Status: DC | PRN
  Administered 2023-04-06 – 2023-04-07 (×3): 100 mg via ORAL
  Filled 2023-03-31 (×3): qty 1

## 2023-03-31 MED ORDER — MELATONIN 5 MG PO TABS
5.0000 mg | ORAL_TABLET | Freq: Every evening | ORAL | Status: DC | PRN
  Administered 2023-03-31 – 2023-04-11 (×11): 5 mg via ORAL
  Filled 2023-03-31 (×11): qty 1

## 2023-03-31 MED ORDER — CHLORHEXIDINE GLUCONATE CLOTH 2 % EX PADS
6.0000 | MEDICATED_PAD | Freq: Every day | CUTANEOUS | Status: DC
  Administered 2023-03-31 – 2023-04-12 (×13): 6 via TOPICAL

## 2023-03-31 MED ORDER — ORAL CARE MOUTH RINSE: 15.0000 mL | OROMUCOSAL | Status: DC | PRN

## 2023-03-31 MED ORDER — ACETAMINOPHEN 325 MG PO TABS
650.0000 mg | ORAL_TABLET | Freq: Once | ORAL | Status: AC
  Administered 2023-03-31: 650 mg via ORAL
  Filled 2023-03-31: qty 2

## 2023-03-31 MED ORDER — SODIUM CHLORIDE 0.9 % IV SOLN
500.0000 mg | INTRAVENOUS | Status: DC
  Administered 2023-03-31: 500 mg via INTRAVENOUS
  Filled 2023-03-31: qty 5

## 2023-03-31 MED ORDER — SODIUM CHLORIDE 0.9% IV SOLUTION: Freq: Once | INTRAVENOUS | Status: AC

## 2023-03-31 MED ORDER — BOOST / RESOURCE BREEZE PO LIQD CUSTOM
1.0000 | Freq: Three times a day (TID) | ORAL | Status: DC
  Administered 2023-04-01 – 2023-04-13 (×33): 1 via ORAL

## 2023-03-31 MED ORDER — FUROSEMIDE 10 MG/ML IJ SOLN
40.0000 mg | Freq: Once | INTRAMUSCULAR | Status: AC
  Administered 2023-03-31: 40 mg via INTRAVENOUS
  Filled 2023-03-31: qty 4

## 2023-03-31 MED ORDER — SODIUM CHLORIDE 0.9 % IV SOLN
2.0000 g | INTRAVENOUS | Status: DC
  Administered 2023-03-31: 2 g via INTRAVENOUS
  Filled 2023-03-31: qty 20

## 2023-03-31 MED ORDER — ATORVASTATIN CALCIUM 80 MG PO TABS
80.0000 mg | ORAL_TABLET | Freq: Every day | ORAL | Status: DC
  Administered 2023-03-31 – 2023-04-14 (×15): 80 mg via ORAL
  Filled 2023-03-31 (×15): qty 1

## 2023-03-31 MED ORDER — SODIUM CHLORIDE 0.9 % IV SOLN: Freq: Once | INTRAVENOUS | Status: DC

## 2023-03-31 MED ORDER — SODIUM CHLORIDE 0.9 % IV BOLUS
500.0000 mL | Freq: Once | INTRAVENOUS | Status: AC
  Administered 2023-03-31: 500 mL via INTRAVENOUS

## 2023-03-31 MED ORDER — NOREPINEPHRINE 4 MG/250ML-% IV SOLN
INTRAVENOUS | Status: AC
  Administered 2023-03-31: 2 ug/min via INTRAVENOUS
  Filled 2023-03-31: qty 250

## 2023-03-31 MED ORDER — POLYETHYLENE GLYCOL 3350 17 G PO PACK: 17.0000 g | PACK | Freq: Every day | ORAL | Status: DC | PRN

## 2023-03-31 MED ORDER — PANTOPRAZOLE SODIUM 40 MG PO TBEC
40.0000 mg | DELAYED_RELEASE_TABLET | Freq: Two times a day (BID) | ORAL | Status: DC
  Administered 2023-03-31 – 2023-04-14 (×29): 40 mg via ORAL
  Filled 2023-03-31 (×29): qty 1

## 2023-03-31 NOTE — ED Notes (Signed)
Per EDP levophed ordered for blood pressure and emergeny blood release ordered for another unit at this time.

## 2023-03-31 NOTE — Progress Notes (Signed)
   PCCM transfer request    Sending physician: Estell Harpin  Sending facility: [facility]  Reason for transfer: Hypotension, GI bleed investigation  Brief case summary known ischemic cardiomyopathy.  Now having melena.  Hypotensive with hemoglobin 6.2.  Recommendations made prior to transfer: Transfer ED to ED as no bed available.  Transfuse 1 to 2 units PRBC.  Okay with norepinephrine if necessary to support pressure.  Transfer accepted: yes/no yes   Lynnell Catalan 03/31/23 1:40 PM  Pulmonary & Critical Care  For contact information, see Amion. If no response to pager, please call PCCM consult pager. After hours, 7PM- 7AM, please call Elink.

## 2023-03-31 NOTE — ED Triage Notes (Addendum)
Pt brought in by family today for generalized weakness. Per family pt has been getting weaker "for while now." Pt has hx of CHF, and is seeing a wound specialist for his rt leg. Pt states pain in the rt leg, "oozing" noted. Pt states having some SOB, bilateral leg swelling, and per family pt has hx of anemia.

## 2023-03-31 NOTE — ED Notes (Signed)
Pt transferred before 15 post start vs.

## 2023-03-31 NOTE — ED Provider Notes (Signed)
[place] EMERGENCY DEPARTMENT AT [facility] Provider Note   CSN: [id number] Arrival date & time: 03/31/23  [id number]     History {Add pertinent medical, surgical, social history, OB history to HPI:1} Chief Complaint  Patient presents with   Weakness    Leonard Cisneros is a 82 y.o. male.  Patient has a history ischemic cardiomyopathy and history of upper GI bleeds.  Patient presented with weakness and hypotension  The history is provided by the patient and medical records. No language interpreter was used.  Weakness Severity:  Moderate Onset quality:  Sudden Timing:  Constant Progression:  Worsening Chronicity:  New Context: not alcohol use   Relieved by:  Nothing Worsened by:  Nothing Ineffective treatments:  None tried Associated symptoms: no abdominal pain, no chest pain, no cough, no diarrhea, no frequency, no headaches and no seizures        Home Medications Prior to Admission medications   Medication Sig Start Date End Date Taking? Authorizing Provider  acetaminophen (TYLENOL) 325 MG tablet Take 2 tablets (650 mg total) by mouth every 6 (six) hours as needed for mild pain (or Fever >/= 101). 02/16/23   [name], MD  atorvastatin (LIPITOR) 80 MG tablet TAKE 1 TABLET BY MOUTH EVERY DAY 11/30/22   [name], MD  Iron, Ferrous Sulfate, 325 (65 Fe) MG TABS Take 325 mg by mouth daily. 02/16/23   [name], MD  metoprolol tartrate (LOPRESSOR) 25 MG tablet Take 1 tablet (25 mg total) by mouth 2 (two) times daily. 03/14/23 06/12/23  [name], NP  nitroGLYCERIN (NITROSTAT) 0.4 MG SL tablet Place 1 tablet (0.4 mg total) under the tongue every 5 (five) minutes x 3 doses as needed for chest pain (if no relief after 3rd dose, proceed to the ED for an evaluation or call 911). 09/05/22   [name], MD  pantoprazole (PROTONIX) 40 MG tablet Take 1 tablet (40 mg total) by mouth 2 (two) times daily. 02/25/23 05/26/23  [name], NP   Potassium Chloride ER 20 MEQ TBCR Take 1 tablet (20 mEq total) by mouth daily. 1 tab daily by mouth 02/16/23   [name], MD  silver sulfADIAZINE (SILVADENE) 1 % cream Apply 1 Application topically daily. 02/12/23   [name], FNP  torsemide (DEMADEX) 20 MG tablet Take 2 tablets (40 mg total) by mouth 2 (two) times daily. 02/16/23 02/11/24  [name], MD      Allergies    Augmentin [amoxicillin-pot clavulanate]    Review of Systems   Review of Systems  Constitutional:  Negative for appetite change and fatigue.  HENT:  Negative for congestion, ear discharge and sinus pressure.   Eyes:  Negative for discharge.  Respiratory:  Negative for cough.   Cardiovascular:  Negative for chest pain.  Gastrointestinal:  Negative for abdominal pain and diarrhea.  Genitourinary:  Negative for frequency and hematuria.  Musculoskeletal:  Negative for back pain.  Skin:  Negative for rash.  Neurological:  Positive for weakness. Negative for seizures and headaches.  Psychiatric/Behavioral:  Negative for hallucinations.     Physical Exam Updated Vital Signs BP (!) 81/57   Pulse 85   Temp 97.6 F (36.4 C) (Oral)   Resp (!) 21   Ht 5\' 11"  (1.803 m)   Wt 86.2 kg   SpO2 100%   BMI 26.50 kg/m  Physical Exam Vitals and nursing note reviewed.  Constitutional:      Appearance: He is well-developed.  HENT:  Head: Normocephalic.     Nose: Nose normal.  Eyes:     General: No scleral icterus.    Conjunctiva/sclera: Conjunctivae normal.  Neck:     Thyroid: No thyromegaly.  Cardiovascular:     Rate and Rhythm: Normal rate and regular rhythm.     Heart sounds: No murmur heard.    No friction rub. No gallop.  Pulmonary:     Breath sounds: No stridor. No wheezing or rales.  Chest:     Chest wall: No tenderness.  Abdominal:     General: There is no distension.     Tenderness: There is no abdominal tenderness. There is no rebound.  Genitourinary:    Comments: Rectal exam dark  brown stool with heme positive Musculoskeletal:        General: Normal range of motion.     Cervical back: Neck supple.  Lymphadenopathy:     Cervical: No cervical adenopathy.  Skin:    Findings: No erythema or rash.  Neurological:     Mental Status: He is alert and oriented to person, place, and time.     Motor: No abnormal muscle tone.     Coordination: Coordination normal.  Psychiatric:        Behavior: Behavior normal.     ED Results / Procedures / Treatments   Labs (all labs ordered are listed, but only abnormal results are displayed) Labs Reviewed  LACTIC ACID, PLASMA - Abnormal; Notable for the following components:      Result Value   Lactic Acid, Venous 3.1 (*)    All other components within normal limits  COMPREHENSIVE METABOLIC PANEL - Abnormal; Notable for the following components:   Sodium 130 (*)    Glucose, Bld 115 (*)    BUN 38 (*)    Creatinine, Ser 1.94 (*)    Calcium 7.8 (*)    Total Protein 5.8 (*)    Albumin 2.5 (*)    AST 67 (*)    Alkaline Phosphatase 284 (*)    Total Bilirubin 3.5 (*)    GFR, Estimated 34 (*)    All other components within normal limits  CBC WITH DIFFERENTIAL/PLATELET - Abnormal; Notable for the following components:   WBC 11.9 (*)    RBC 1.66 (*)    Hemoglobin 6.2 (*)    HCT 19.3 (*)    MCV 116.3 (*)    MCH 37.3 (*)    RDW 25.0 (*)    nRBC 0.3 (*)    Neutro Abs 9.2 (*)    Monocytes Absolute 1.2 (*)    Abs Immature Granulocytes 0.20 (*)    All other components within normal limits  PROTIME-INR - Abnormal; Notable for the following components:   Prothrombin Time 16.7 (*)    INR 1.3 (*)    All other components within normal limits  BRAIN NATRIURETIC PEPTIDE - Abnormal; Notable for the following components:   B Natriuretic Peptide 1,121.0 (*)    All other components within normal limits  POC OCCULT BLOOD, ED - Abnormal; Notable for the following components:   Fecal Occult Blood Positive (*)    All other components  within normal limits  TROPONIN I (HIGH SENSITIVITY) - Abnormal; Notable for the following components:   Troponin I (High Sensitivity) 2,150 (*)    All other components within normal limits  RESP PANEL BY RT-PCR (RSV, FLU A&B, COVID)  RVPGX2  CULTURE, BLOOD (ROUTINE X 2)  CULTURE, BLOOD (ROUTINE X 2)  APTT  LACTIC ACID, PLASMA  URINALYSIS, W/ REFLEX TO CULTURE (INFECTION SUSPECTED)  PATHOLOGIST SMEAR REVIEW  TYPE AND SCREEN  PREPARE RBC (CROSSMATCH)  TROPONIN I (HIGH SENSITIVITY)  TROPONIN I (HIGH SENSITIVITY)    EKG EKG Interpretation Date/Time:  Sunday March 31 2023 09:56:01 EDT Ventricular Rate:  92 PR Interval:    QRS Duration:  96 QT Interval:  417 QTC Calculation: 516 R Axis:   8  Text Interpretation: Atrial fibrillation Low voltage, extremity leads Repol abnrm suggests ischemia, diffuse leads Prolonged QT interval Confirmed by Bethann Berkshire [id number]) on 03/31/2023 11:02:54 AM  Radiology DG Chest Port 1 View  Result Date: 03/31/2023 CLINICAL DATA:  Questionable sepsis.  Evaluate for abnormality. EXAM: PORTABLE CHEST 1 VIEW COMPARISON:  One-view chest 02/14/2023 FINDINGS: Heart is enlarged. Moderate pulmonary vascular congestion is present. Bilateral effusions are similar the prior exam. IMPRESSION: Cardiomegaly with moderate pulmonary vascular congestion and bilateral effusions compatible with congestive heart failure. Electronically Signed   By: Marin Roberts M.D.   On: 03/31/2023 10:32    Procedures Procedures  {Document cardiac monitor, telemetry assessment procedure when appropriate:1}  Medications Ordered in ED Medications  cefTRIAXone (ROCEPHIN) 2 g in sodium chloride 0.9 % 100 mL IVPB (0 g Intravenous Stopped 03/31/23 1020)  azithromycin (ZITHROMAX) 500 mg in sodium chloride 0.9 % 250 mL IVPB (0 mg Intravenous Stopped 03/31/23 1119)  0.9 %  sodium chloride infusion (Manually program via Guardrails IV Fluids) (has no administration in time range)  sodium  chloride 0.9 % bolus 1,000 mL (1,000 mLs Intravenous Bolus 03/31/23 1017)  pantoprazole (PROTONIX) injection 40 mg (40 mg Intravenous Given 03/31/23 1115)  acetaminophen (TYLENOL) tablet 650 mg (650 mg Oral Given 03/31/23 1115)  sodium chloride 0.9 % bolus 500 mL (500 mLs Intravenous Bolus 03/31/23 1122)    ED Course/ Medical Decision Making/ A&P  CRITICAL CARE Performed by: Bethann Berkshire Total critical care time: 80 minutes Critical care time was exclusive of separately billable procedures and treating other patients. Critical care was necessary to treat or prevent imminent or life-threatening deterioration. Critical care was time spent personally by me on the following activities: development of treatment plan with patient and/or surrogate as well as nursing, discussions with consultants, evaluation of patient's response to treatment, examination of patient, obtaining history from patient or surrogate, ordering and performing treatments and interventions, ordering and review of laboratory studies, ordering and review of radiographic studies, pulse oximetry and re-evaluation of patient's condition.   Patient with anemia from upper GI bleed along with an NSTEMI from anemia.  I spoke spoke with Dr. Denese Killings and he wanted to admit the patient to [facility], ICU.  There were no beds at [facility], ICU so he recommended sending the patient to the emergency department and critical care can see him there.  I spoke with Dr. Rosalia Hammers emergency physician and she knows the patient is coming.  The patient is getting O- blood initially and then crossmatched blood. {   Click here for ABCD2, HEART and other calculatorsREFRESH Note before signing :1}                          Medical Decision Making Amount and/or Complexity of Data Reviewed Labs: ordered. Radiology: ordered. ECG/medicine tests: ordered.  Risk OTC drugs. Prescription drug management.   Hypotension secondary to cardiomyopathy, NSTEMI, GI bleed.   Patient is being admitted to [facility] but will go to the emergency department until an ICU bed is available  {Document critical care time  when appropriate:1} {Document review of labs and clinical decision tools ie heart score, Chads2Vasc2 etc:1}  {Document your independent review of radiology images, and any outside records:1} {Document your discussion with family members, caretakers, and with consultants:1} {Document social determinants of health affecting pt's care:1} {Document your decision making why or why not admission, treatments were needed:1} Final Clinical Impression(s) / ED Diagnoses Final diagnoses:  None    Rx / DC Orders ED Discharge Orders     None

## 2023-03-31 NOTE — ED Notes (Signed)
Per Dr. Earlene Plater he wants blood drawn on patient now. Patient is currently receiving a unit of red blood cells, normally our protocol is to wait at least an hour after the unit has finished, but they would like labs collected now. Per Dr. Earlene Plater just please draw from opposite arm. Will continue to  monitor.

## 2023-03-31 NOTE — ED Notes (Signed)
Pt transported by [facility] to [facility]. This RN attempted to call charge RN at [facility] for ED to ED transfer on this pt. Charge RN refused to take report at this time and wanted the [facility] contacted. Pt has already left with [facility].

## 2023-03-31 NOTE — Sepsis Progress Note (Addendum)
[facility] following code sepsis  1330 bedside RN messaged about second lactic, pt in the process of tx to [facility], somehow second lactic order was cancelled per bedside RN, she thought probably because he was receiving blood, receiving ED MD messaged asking for  second lactic order when he arrives to [facility]

## 2023-03-31 NOTE — ED Notes (Signed)
Received pt as a transfer from [facility]. Pt was hypotensive with a hgb of 6.2. Pt is currently on Levo at 4 mcg and is currently receiving his 2nd unit. Pt denies any pain at this time. Will continue to monitor.

## 2023-03-31 NOTE — Plan of Care (Signed)
  Problem: Education: Goal: Knowledge of General Education information will improve Description: Including pain rating scale, medication(s)/side effects and non-pharmacologic comfort measures Outcome: Progressing   Problem: Health Behavior/Discharge Planning: Goal: Ability to manage health-related needs will improve Outcome: Progressing   Problem: Clinical Measurements: Goal: Ability to maintain clinical measurements within normal limits will improve Outcome: Progressing Goal: Will remain free from infection Outcome: Progressing Goal: Diagnostic test results will improve Outcome: Progressing Goal: Respiratory complications will improve Outcome: Progressing Goal: Cardiovascular complication will be avoided Outcome: Progressing   Problem: Nutrition: Goal: Adequate nutrition will be maintained Outcome: Progressing   Problem: Nutrition: Goal: Adequate nutrition will be maintained Outcome: Progressing   Problem: Safety: Goal: Ability to remain free from injury will improve Outcome: Progressing   Problem: Pain Managment: Goal: General experience of comfort will improve Outcome: Progressing

## 2023-03-31 NOTE — H&P (Signed)
NAME:  Leonard Cisneros, MRN:  [id number], DOB:  1940-11-01, LOS: 0 ADMISSION DATE:  03/31/2023, CONSULTATION DATE: 03/31/2023 REFERRING MD: ED [facility], CHIEF COMPLAINT: Anemia  History of Present Illness:  82 year old man with increasing lethargy and dyspnea at home. Found to have hemoglobin of 6.2 in ED.  Transfusion started.  Hypotensive into the 80s requiring norepinephrine.  [facility] for ICU admission. Denies frank melena.  No vomiting. History of ischemic cardiomyopathy with triple-vessel disease.  Prior bypass 2003 . Medical management was recommended.  EF is normal.  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 Atrial fibrillation status post Watchman. History of chronic anemia with negative colonoscopy and EGD.  Ferritin has been normal in the past.  B12 and folate normal.  Pertinent  Medical History   Past Medical History:  Diagnosis Date   Aortic stenosis    Atrial flutter (HCC)    a. s/p remote ablation by Dr. Ladona Ridgel.   CAD (coronary artery disease)    a. s/p CABG 2003. b. 01/2016: unstable angina - s/p BMS to SVG-ramus intermediate, patent LIMA-mLAD, patent RIMA-dRCA.    CHF (congestive heart failure) (HCC)    Chronic lower back pain    Essential hypertension    Hyperlipidemia    Ischemic cardiomyopathy    a. Cath 01/2016 -  EF 50-55% with mild distal inferior hypocontractility.   Peripheral neuropathy 04/04/2014   Permanent atrial fibrillation (HCC)      Significant Hospital Events: Including procedures, antibiotic start and stop dates in addition to other pertinent events   7/28 received in transfer from [facility]  Interim History / Subjective:  Feels better posttransfusion.  Objective   Blood pressure 92/60, pulse 78, temperature (!) 97.5 F (36.4 C), temperature source Oral, resp. rate (!) 21, height 5\' 11"  (1.803 m), weight 86.2 kg, SpO2 100%.        Intake/Output Summary (Last 24 hours)  at 03/31/2023 1534 Last data filed at 03/31/2023 1256 Gross per 24 hour  Intake 665 ml  Output --  Net 665 ml   Filed Weights   03/31/23 0947  Weight: 86.2 kg    Examination: General: In no distress. HENT: No scleral icterus.  His sclera are pale.  Mucous membranes are moist. Lungs: Clear to auscultation bilaterally.  Currently on room air Cardiovascular: No JVD. Abdomen: Soft and nontender. Extremities: Bilateral venous stasis changes to the level of the knee with open superficial ulcers without purulence. Neuro: Clear sensorium no focal deficits. GU: Deferred.  Ancillary tests personally reviewed  Chest x-ray compatible with volume overload small bilateral effusions Normochromic macrocytic anemia Sodium 130, creatinine elevated from baseline at 1.94 BNP elevated 1100, troponin 2100 Lactate 3.1 Hemoglobin 6.2 initially, now 8.4 posttransfusion.  Normal reticulocyte count in the face of anemia. 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  Demand-related ischemia due to anemia.  Known ICM with EF 60% and mild PHT. Severe venous insufficiency  Plan:  -Titrate norepinephrine to keep MAP greater than 65. -Gentle diuresis given evidence of congestion on chest x-ray, elevated BNP and ongoing blood administration. -Transfused 2 units PRBC with good response. -Recheck ferritin to assess iron stores but macrocytic anemia and not generally compatible with iron deficiency.  May need bone marrow examination.  Hold on GI consultation at this time for capsule endoscopy not certain that he is having active GI bleeding.  May need hematology consultation.  Smear review pending. -Hold GDMT for now due  to marginal blood pressure. -Wound care to see regarding venous ulcerations.  Presently look clean and uninfected.  No antibiotics.  Best Practice (right click and "Reselect all SmartList Selections" daily)   Diet/type: Regular consistency (see orders) DVT prophylaxis:  not indicated GI prophylaxis: PPI Lines: N/A Foley:  N/A Code Status:  full code Last date of multidisciplinary goals of care discussion [patient and wife updated at bedside]  CRITICAL CARE Performed by: Lynnell Catalan   Total critical care time: 40 minutes  Critical care time was exclusive of separately billable procedures and treating other patients.  Critical care was necessary to treat or prevent imminent or life-threatening deterioration.  Critical care was time spent personally by me on the following activities: development of treatment plan with patient and/or surrogate as well as nursing, discussions with consultants, evaluation of patient's response to treatment, examination of patient, obtaining history from patient or surrogate, ordering and performing treatments and interventions, ordering and review of laboratory studies, ordering and review of radiographic studies, pulse oximetry, re-evaluation of patient's condition and participation in multidisciplinary rounds.  Lynnell Catalan, MD [facility] ICU Physician [facility] [place]  Pager: [phone] Mobile: [phone] After hours: [phone].

## 2023-03-31 NOTE — ED Provider Notes (Signed)
  Physical Exam  BP (!) 84/53   Pulse 78   Temp (!) 97.5 F (36.4 C) (Oral)   Resp (!) 29   Ht 5\' 11"  (1.803 m)   Wt 86.2 kg   SpO2 98%   BMI 26.50 kg/m   Physical Exam Vitals and nursing note reviewed.  Constitutional:      General: He is not in acute distress.    Appearance: He is well-developed. He is ill-appearing.     Comments: Chronically ill-appearing  HENT:     Head: Normocephalic and atraumatic.  Eyes:     Extraocular Movements: Extraocular movements intact.     Conjunctiva/sclera: Conjunctivae normal.     Pupils: Pupils are equal, round, and reactive to light.  Cardiovascular:     Rate and Rhythm: Normal rate and regular rhythm.     Heart sounds: No murmur heard. Pulmonary:     Effort: Pulmonary effort is normal. No respiratory distress.     Breath sounds: Normal breath sounds.  Abdominal:     Palpations: Abdomen is soft.     Tenderness: There is no abdominal tenderness. There is no guarding or rebound.  Musculoskeletal:     Cervical back: Neck supple.     Right lower leg: Edema present.     Left lower leg: Edema present.     Comments: Chronic lower extremity wounds.  No erythema, fluctuance, crepitus.  Skin:    General: Skin is warm and dry.     Capillary Refill: Capillary refill takes less than 2 seconds.  Neurological:     General: No focal deficit present.     Mental Status: He is alert and oriented to person, place, and time. Mental status is at baseline.  Psychiatric:        Mood and Affect: Mood normal.     Procedures  Procedures  ED Course / MDM    Medical Decision Making Amount and/or Complexity of Data Reviewed Labs: ordered. Radiology: ordered. ECG/medicine tests: ordered.  Risk OTC drugs. Prescription drug management. Decision regarding hospitalization.   Briefly, this patient is a transfer from [facility].  He has a history of atrial fibrillation no longer on anticoagulation, heart failure with reduced ejection fraction, CAD,  hypertension, hyperlipidemia, ischemic cardiomyopathy presenting for not feeling well and episode of melena.  He was found be hypotensive there with lactic acid of 3.1.  He also had elevated troponin concerning for NSTEMI.  He was given Rocephin, azithromycin, and started on Levophed with 2 units of blood.  He arrives here with second unit transfusing.  He also received a liter and a half of saline.  Here he has low systolic pressures with maps around 65 on 4 of levo.  He is getting his second unit of blood.  Bedside also shows reduced ejection fraction, dilated IVC.  Patient was discussed with critical care, and transferred here for further evaluation as there was no bed available.  On my exam he is awake and alert, denies chest pain, shortness of breath.  Endorses some recent melanotic stools.  He did receive IV Protonix.  He had a colonoscopy in July with 2 nonbleeding internal hemorrhoids, 2 polyps removed.  He also had an endoscopy in June with a nonbleeding duodenal ulcer and gastritis.  I discussed the patient with critical care who is coming to see him.  Patient admitted to critical care.    Laurence Spates, MD 03/31/23 [phone]

## 2023-03-31 NOTE — Progress Notes (Deleted)
GI Office Note    Referring Provider: [name], FNP Primary Care Physician:  [name], FNP Primary Gastroenterologist: ***  Date:  03/31/2023  ID:  Leonard Cisneros, DOB 20-May-1941, MRN [id number]   Chief Complaint   No chief complaint on file.    History of Present Illness  Leonard Cisneros is a 82 y.o. male with a history of *** presenting today with complaint of     No current facility-administered medications for this visit.   No current outpatient medications on file.   Facility-Administered Medications Ordered in Other Visits  Medication Dose Route Frequency Provider Last Rate Last Admin   atorvastatin (LIPITOR) tablet 80 mg  80 mg Oral Daily Agarwala, Daleen Bo, MD   80 mg at 03/31/23 1716   Chlorhexidine Gluconate Cloth 2 % PADS 6 each  6 each Topical Daily Agarwala, Daleen Bo, MD   6 each at 03/31/23 1619   docusate sodium (COLACE) capsule 100 mg  100 mg Oral BID PRN Lynnell Catalan, MD       feeding supplement (BOOST / RESOURCE BREEZE) liquid 1 Container  1 Container Oral TID BM Agarwala, Ravi, MD       norepinephrine (LEVOPHED) 4mg  in (0.016 mg/mL) premix infusion  0-40 mcg/min Intravenous Titrated Bethann Berkshire, MD 15 mL/hr at 03/31/23 1800 4 mcg/min at 03/31/23 1800   Oral care mouth rinse  15 mL Mouth Rinse PRN Agarwala, Daleen Bo, MD       pantoprazole (PROTONIX) EC tablet 40 mg  40 mg Oral BID Lynnell Catalan, MD   40 mg at 03/31/23 1716   polyethylene glycol (MIRALAX / GLYCOLAX) packet 17 g  17 g Oral Daily PRN Lynnell Catalan, MD        Past Medical History:  Diagnosis Date   Aortic stenosis    Atrial flutter (HCC)    a. s/p remote ablation by Dr. Ladona Ridgel.   CAD (coronary artery disease)    a. s/p CABG 2003. b. 01/2016: unstable angina - s/p BMS to SVG-ramus intermediate, patent LIMA-mLAD, patent RIMA-dRCA.    CHF (congestive heart failure) (HCC)    Chronic lower back pain    Essential hypertension    Hyperlipidemia    Ischemic cardiomyopathy     a. Cath 01/2016 -  EF 50-55% with mild distal inferior hypocontractility.   Peripheral neuropathy 04/04/2014   Permanent atrial fibrillation [facility])     Past Surgical History:  Procedure Laterality Date   BACK SURGERY     BIOPSY  11/21/2022   Procedure: BIOPSY;  Surgeon: Napoleon Form, MD;  Location: [facility] ENDOSCOPY;  Service: Gastroenterology;;   BIOPSY  02/15/2023   Procedure: BIOPSY;  Surgeon: Lanelle Bal, DO;  Location: [facility] ENDO SUITE;  Service: Endoscopy;;   CARDIAC CATHETERIZATION  ~ 2000; 2003   CARDIAC CATHETERIZATION N/A 02/01/2016   Procedure: Left Heart Cath and Cors/Grafts Angiography;  Surgeon: Lennette Bihari, MD;  Location: [facility] INVASIVE CV LAB;  Service: Cardiovascular;  Laterality: N/A;   CARDIAC CATHETERIZATION N/A 02/01/2016   Procedure: Coronary Stent Intervention;  Surgeon: Lennette Bihari, MD;  Location: [facility] INVASIVE CV LAB;  Service: Cardiovascular;  Laterality: N/A;   CATARACT EXTRACTION W/PHACO Left 02/09/2019   Procedure: CATARACT EXTRACTION PHACO AND INTRAOCULAR LENS PLACEMENT (IOC);  Surgeon: Fabio Pierce, MD;  Location: [facility] ORS;  Service: Ophthalmology;  Laterality: Left;  CDE: 27.70   CATARACT EXTRACTION W/PHACO Right 02/23/2019   Procedure: CATARACT EXTRACTION PHACO AND INTRAOCULAR LENS PLACEMENT RIGHT EYE;  Surgeon: Fabio Pierce,  MD;  Location: [facility] ORS;  Service: Ophthalmology;  Laterality: Right;  CDE: 10.15   COLONOSCOPY  06/04/2002   Normal colon and rectum   COLONOSCOPY  07/08/2012   Procedure: COLONOSCOPY;  Surgeon: Corbin Ade, MD;  Location: [facility] END[facility];  Service: Endoscopy;  Laterality: N/A;  11:30   COLONOSCOPY N/A 10/02/2017   Procedure: COLONOSCOPY;  Surgeon: Corbin Ade, MD;  Location: [facility] END[facility];  Service: Endoscopy;  Laterality: N/A;  10:30am   COLONOSCOPY WITH PROPOFOL N/A 03/13/2023   Procedure: COLONOSCOPY WITH PROPOFOL;  Surgeon: Lanelle Bal, DO;  Location: [facility] END[facility];  Service: Endoscopy;  Laterality: N/A;  1215pm, asa 3    CORONARY ANGIOPLASTY WITH STENT PLACEMENT  02/01/2016   CORONARY ARTERY BYPASS GRAFT  2003   LIMA to LAD,LIMA to distal RCA,SVG to ramus intermediate vessel.   ESOPHAGOGASTRODUODENOSCOPY (EGD) WITH PROPOFOL N/A 11/21/2022   Procedure: ESOPHAGOGASTRODUODENOSCOPY (EGD) WITH PROPOFOL;  Surgeon: Napoleon Form, MD;  Location: [facility] ENDOSCOPY;  Service: Gastroenterology;  Laterality: N/A;   ESOPHAGOGASTRODUODENOSCOPY (EGD) WITH PROPOFOL N/A 12/27/2022   Procedure: ESOPHAGOGASTRODUODENOSCOPY (EGD) WITH PROPOFOL;  Surgeon: Lanelle Bal, DO;  Location: [facility];  Service: Endoscopy;  Laterality: N/A;   ESOPHAGOGASTRODUODENOSCOPY (EGD) WITH PROPOFOL N/A 02/15/2023   Procedure: ESOPHAGOGASTRODUODENOSCOPY (EGD) WITH PROPOFOL;  Surgeon: Lanelle Bal, DO;  Location: [facility];  Service: Endoscopy;  Laterality: N/A;   FEMORAL REVISION Right 03/30/2019   Procedure: Femoral revision right total hip arthroplasty;  Surgeon: Ollen Gross, MD;  Location: [facility] ORS;  Service: Orthopedics;  Laterality: Right;    FOREIGN BODY REMOVAL Right 04/02/2014   Procedure: FOREIGN BODY REMOVAL RIGHT FOOT;  Surgeon: Dalia Heading, MD;  Location: [facility] ORS;  Service: General;  Laterality: Right;   JOINT REPLACEMENT     JOINT REPLACEMENT     LUMBAR DISC SURGERY     POLYPECTOMY  03/13/2023   Procedure: POLYPECTOMY;  Surgeon: Lanelle Bal, DO;  Location: [facility];  Service: Endoscopy;;   REVISION TOTAL HIP ARTHROPLASTY Bilateral 2005-2012   right-left   RIGHT/LEFT HEART CATH AND CORONARY/GRAFT ANGIOGRAPHY N/A 09/27/2022   Procedure: RIGHT/LEFT HEART CATH AND CORONARY/GRAFT ANGIOGRAPHY;  Surgeon: [place], Peter M, MD;  Location: [facility] INVAS[facility] CV LAB;  Service: Cardiovascular;  Laterality: N/A;   SHOULDER OPEN ROTATOR CUFF REPAIR Right    TOTAL HIP ARTHROPLASTY Right 1999   TOTAL HIP ARTHROPLASTY Left 2008   US ECHOCARDIOGRAPHY  11/13/2011   LV mildly dilated,mild concentric LVH,LA mod - severely dilated,RA  mildly dilated,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    Family History  Problem Relation Age of Onset   Heart attack Brother    Colon cancer Neg Hx     Allergies as of 04/10/2023 - Review Complete 03/31/2023  Allergen Reaction Noted   Augmentin [amoxicillin-pot clavulanate] Nausea And Vomiting and Other (See Comments) 01/14/2013    Social History   Socioeconomic History   Marital status: Married    Spouse name: Larita Fife   Number of children: 2   Years of education: Not on file   Highest education level: Bachelor's degree (e.g., BA, AB, BS)  Occupational History   Occupation: PT Holiday representative: RETIRED  Tobacco Use   Smoking status: Former    Current packs/day: 0.00    Average packs/day: 0.5 packs/day for 2.0 years (1.0 ttl pk-yrs)    Types: Cigarettes    Start  date: 09/03/1964    Quit date: 09/03/1966    Years since quitting: 56.6   Smokeless tobacco: Never  Vaping Use   Vaping status: Never Used  Substance and Sexual Activity   Alcohol use: Yes    Alcohol/week: 11.0 standard drinks of alcohol    Types: 7 Glasses of wine, 4 Cans of beer per week    Comment: a glass of wine with dinner/ beer on the weekend   Drug use: No   Sexual activity: Not on file  Other Topics Concern   Not on file  Social History Narrative   Lives with his wife.   Social Determinants of Health   Financial Resource Strain: Low Risk  (01/04/2023)   Overall Financial Resource Strain (CARDIA)    Difficulty of Paying Living Expenses: Not hard at all  Food Insecurity: No Food Insecurity (02/14/2023)   Hunger Vital Sign    Worried About Running Out of Food in the Last Year: Never true    Ran Out of Food in the Last Year: Never true  Transportation Needs: No Transportation Needs (02/14/2023)   PRAPARE - Administrator, Civil Service (Medical): No    Lack of Transportation (Non-Medical): No   Physical Activity: Unknown (11/25/2022)   Exercise Vital Sign    Days of Exercise per Week: 0 days    Minutes of Exercise per Session: Not on file  Stress: No Stress Concern Present (01/04/2023)   [employer] of Occupational Health - Occupational Stress Questionnaire    Feeling of Stress : Not at all  Social Connections: Moderately Integrated (11/25/2022)   Social Connection and Isolation Panel [NHANES]    Frequency of Communication with Friends and Family: Once a week    Frequency of Social Gatherings with Friends and Family: Once a week    Attends Religious Services: More than 4 times per year    Active Member of [employer] or Organizations: Yes    Attends Engineer, structural: More than 4 times per year    Marital Status: Married     Review of Systems   Gen: Denies fever, chills, anorexia. Denies fatigue, weakness, weight loss.  CV: Denies chest pain, palpitations, syncope, peripheral edema, and claudication. Resp: Denies dyspnea at rest, cough, wheezing, coughing up blood, and pleurisy. GI: See HPI Derm: Denies rash, itching, dry skin Psych: Denies depression, anxiety, memory loss, confusion. No homicidal or suicidal ideation.  Heme: Denies bruising, bleeding, and enlarged lymph nodes.   Physical Exam   There were no vitals taken for this visit.  General:   Alert and oriented. No distress noted. Pleasant and cooperative.  Head:  Normocephalic and atraumatic. Eyes:  Conjuctiva clear without scleral icterus. Mouth:  Oral mucosa pink and moist. Good dentition. No lesions. Lungs:  Clear to auscultation bilaterally. No wheezes, rales, or rhonchi. No distress.  Heart:  S1, S2 present without murmurs appreciated.  Abdomen:  +BS, soft, non-tender and non-distended. No rebound or guarding. No HSM or masses noted. Rectal: *** Msk:  Symmetrical without gross deformities. Normal posture. Extremities:  Without edema. Neurologic:  Alert and  oriented x4 Psych:  Alert and  cooperative. Normal mood and affect.   Assessment  ANKER Cisneros is a 82 y.o. male with a history of *** presenting today with    PLAN   *** Given's capsule? CBC?     Brooke Bonito, MSN, FNP-BC, AGACNP-BC [facility]

## 2023-03-31 NOTE — Progress Notes (Signed)
eLink Physician-Brief Progress Note Patient Name: Leonard Cisneros DOB: 12-03-1940 MRN: [id number]   Date of Service  03/31/2023  HPI/Events of Note  82 year old male that presented with decompensated heart failure with recurrent anemia.  Was transfused 2 units of PRBC with appropriate response.  Repeat Trope 2150 up from a 1742.  eICU Interventions  Hemoglobin 8.4, holding 3 rd unit for now.  Will repeat troponin in the morning.  Technically fairly stable.     Intervention Category Intermediate Interventions: Bleeding - evaluation and treatment with blood products  Quinta Eimer 03/31/2023, 11:44 PM

## 2023-03-31 NOTE — ED Notes (Signed)
Pt give something to drink at this time per EDP approval.

## 2023-03-31 NOTE — Discharge Instructions (Signed)
Patient is being transported over to [facility] emergency department.  Critical care Dr. Denese Killings accepting patient to admit and Dr. Rosalia Hammers emergency physician also accepting the patient to the ED

## 2023-03-31 NOTE — ED Notes (Signed)
Spoke with EDP about lab stating they had to contact [facility] for blood b/c the pt is RH +. Per EDP if blood is not here when the next bag is due pt will receive another bag of emergency blood.

## 2023-03-31 NOTE — ED Notes (Signed)
Additional 500 mL/ bolus ordered  per EDP for hypotension at this time.

## 2023-04-01 ENCOUNTER — Other Ambulatory Visit (HOSPITAL_COMMUNITY): Payer: Medicare HMO

## 2023-04-01 DIAGNOSIS — R195 Other fecal abnormalities: Secondary | ICD-10-CM | POA: Diagnosis not present

## 2023-04-01 DIAGNOSIS — I4891 Unspecified atrial fibrillation: Secondary | ICD-10-CM | POA: Diagnosis not present

## 2023-04-01 DIAGNOSIS — N179 Acute kidney failure, unspecified: Secondary | ICD-10-CM | POA: Diagnosis not present

## 2023-04-01 DIAGNOSIS — D62 Acute posthemorrhagic anemia: Secondary | ICD-10-CM | POA: Diagnosis not present

## 2023-04-01 DIAGNOSIS — Z8719 Personal history of other diseases of the digestive system: Secondary | ICD-10-CM | POA: Insufficient documentation

## 2023-04-01 DIAGNOSIS — D649 Anemia, unspecified: Secondary | ICD-10-CM | POA: Diagnosis not present

## 2023-04-01 LAB — TROPONIN I (HIGH SENSITIVITY): Troponin I (High Sensitivity): 2427 ng/L (ref <18)

## 2023-04-01 MED ORDER — NOREPINEPHRINE 4 MG/250ML-% IV SOLN: 2.0000 ug/min | INTRAVENOUS | Status: DC

## 2023-04-01 MED ORDER — POTASSIUM CHLORIDE CRYS ER 20 MEQ PO TBCR
40.0000 meq | EXTENDED_RELEASE_TABLET | Freq: Once | ORAL | Status: AC
  Administered 2023-04-01: 40 meq via ORAL
  Filled 2023-04-01: qty 2

## 2023-04-01 MED ORDER — SODIUM CHLORIDE 0.9 % IV SOLN: 250.0000 mL | INTRAVENOUS | Status: DC

## 2023-04-01 NOTE — Consult Note (Signed)
WOC Nurse Consult Note: Reason for Consult: Consult requested for bilat legs.  Pt has chronic venous stasis ulcers which he states his wife treats at home with Xeroform gauze Left lower calf with full thickness wound; 4X4X.2cm, 50% red, 50% yellow, mod amt yellow drainage Left 3rd toe with dry dark red scab Right anterior and poster calf with patchy areas of full thickness skin loss, mod amt yellow drainage, wound beds 50% red, 50% yellow.  Dressing procedure/placement/frequency: Topical treatment orders provided for bedside nurses to perform as follows to promote drying and healing: 1. Apply Xeroform gauze and ABD pads and kerlex to right leg Q day 2. Foam dressing to left leg, change Q 3 days or PRN soiling. Please re-consult if further assistance is needed.  Thank-you,  Cammie Mcgee MSN, RN, CWOCN, [place], CNS [phone]

## 2023-04-01 NOTE — Progress Notes (Signed)
Vasopressor stopped due to no longer being needed for blood pressure support.

## 2023-04-01 NOTE — Progress Notes (Signed)
Secure chat received from nurse, Harrold Donath RN [facility] RN/Laura RN VAST also included in chat. Early Osmond RN to place consult for USGPIV for vasopressor infusions. Reinforced vasopressor not to be infused to upper arm PIVs. And depth of USGPIV in FA dept not greater than 0.50mm.  Tomasita Morrow, RN VAST

## 2023-04-01 NOTE — Progress Notes (Signed)
PT Cancellation Note  Patient Details Name: Leonard Cisneros MRN: [id number] DOB: 11-Jul-1941   Cancelled Treatment:    Reason Eval/Treat Not Completed: Other (comment). Pt receiving a bath upon PT arrival. PT will attempt to follow up as time allows.   Arlyss Gandy 04/01/2023, 3:53 PM

## 2023-04-01 NOTE — Consult Note (Signed)
Consultation  Referring Provider: CCM/Agarwala Primary Care Physician:  [name], FNP Primary Gastroenterologist:  Orma Render Pattricia Boss Penn  Reason for Consultation:   anemia, heme positive stool   HPI: Leonard Cisneros is a 82 y.o. male admitted in transfer from [facility] yesterday where he had presented with profound weakness, presyncope and was found to be hypotensive with blood pressure 81/50 on arrival.  He was documented to be heme positive.  BNP markedly elevated at 1100 and troponins 2100.  Diagnosed with NSTEMI secondary to known ischemic cardiomyopathy, and coronary component secondary to anemia/GI blood loss. Hemoglobin was 6.2 in the emergency room there, received 2 units of packed RBCs prior to arrival here.  He was started on norepinephrine for hypotension.  He had not reported any frank melena but on questioning today he says that he has seen very dark stool off and on over the past few months.  He is on chronic iron supplement but seems to think that he has days where her stools are darker.  There has been no frank hematochezia.  No complaints of abdominal pain or discomfort no nausea. He has history of ischemic cardiomyopathy with triple-vessel disease.  EF here in normal range.  He also has history of atrial fibrillation and is to be evaluated for watchman but has not had that as yet. Does have history of chronic anemia, prior history of iron deficiency.  In reviewing his labs he is usually in the 7.5-8 range 02/25/2023 hemoglobin 7.5, 11 03/05/2023 hemoglobin 7.5, on 03/15/2023 hemoglobin 7.7/MCV 113/platelets 207  On admit yesterday lactate 3.1 BUN 38/creatinine 1.94 Hemoglobin 6.2/MCV 116 Pro time 16.7/INR 1.3  Ferritin 483 B12 and folate were normal in March 2024.  Patient has just had recent GI evaluation per Dr. Geraldine Solar with EGD on 02/15/2023 showing mild gastritis and a clean-based 3 mm duodenal ulcer.  Biopsies were negative for H. pylori. He then had  colonoscopy on 03/13/2023 showing nonbleeding internal hemorrhoids, 2 small polyps measuring 3 to 5 mm were removed these were tubular adenomas, and exam was otherwise negative.  He has not had capsule endoscopy. He is not currently anticoagulated at home.   Past Medical History:  Diagnosis Date   Aortic stenosis    Atrial flutter (HCC)    a. s/p remote ablation by Dr. Ladona Ridgel.   CAD (coronary artery disease)    a. s/p CABG 2003. b. 01/2016: unstable angina - s/p BMS to SVG-ramus intermediate, patent LIMA-mLAD, patent RIMA-dRCA.    CHF (congestive heart failure) (HCC)    Chronic lower back pain    Essential hypertension    Hyperlipidemia    Ischemic cardiomyopathy    a. Cath 01/2016 -  EF 50-55% with mild distal inferior hypocontractility.   Peripheral neuropathy 04/04/2014   Permanent atrial fibrillation [facility])     Past Surgical History:  Procedure Laterality Date   BACK SURGERY     BIOPSY  11/21/2022   Procedure: BIOPSY;  Surgeon: Napoleon Form, MD;  Location: [facility] ENDOSCOPY;  Service: Gastroenterology;;   BIOPSY  02/15/2023   Procedure: BIOPSY;  Surgeon: Lanelle Bal, DO;  Location: [facility] ENDO SUITE;  Service: Endoscopy;;   CARDIAC CATHETERIZATION  ~ 2000; 2003   CARDIAC CATHETERIZATION N/A 02/01/2016   Procedure: Left Heart Cath and Cors/Grafts Angiography;  Surgeon: Lennette Bihari, MD;  Location: [facility] INVASIVE CV LAB;  Service: Cardiovascular;  Laterality: N/A;   CARDIAC CATHETERIZATION N/A 02/01/2016   Procedure: Coronary Stent Intervention;  Surgeon: Lennette Bihari,  MD;  Location: [facility] INVASIVE CV LAB;  Service: Cardiovascular;  Laterality: N/A;   CATARACT EXTRACTION W/PHACO Left 02/09/2019   Procedure: CATARACT EXTRACTION PHACO AND INTRAOCULAR LENS PLACEMENT (IOC);  Surgeon: Fabio Pierce, MD;  Location: [facility] ORS;  Service: Ophthalmology;  Laterality: Left;  CDE: 27.70   CATARACT EXTRACTION W/PHACO Right 02/23/2019   Procedure: CATARACT EXTRACTION PHACO AND INTRAOCULAR LENS  PLACEMENT RIGHT EYE;  Surgeon: Fabio Pierce, MD;  Location: [facility] ORS;  Service: Ophthalmology;  Laterality: Right;  CDE: 10.15   COLONOSCOPY  06/04/2002   Normal colon and rectum   COLONOSCOPY  07/08/2012   Procedure: COLONOSCOPY;  Surgeon: Corbin Ade, MD;  Location: [facility] ENDO SUITE;  Service: Endoscopy;  Laterality: N/A;  11:30   COLONOSCOPY N/A 10/02/2017   Procedure: COLONOSCOPY;  Surgeon: Corbin Ade, MD;  Location: [facility] ENDO SUITE;  Service: Endoscopy;  Laterality: N/A;  10:30am   COLONOSCOPY WITH PROPOFOL N/A 03/13/2023   Procedure: COLONOSCOPY WITH PROPOFOL;  Surgeon: Lanelle Bal, DO;  Location: [facility] ENDO SUITE;  Service: Endoscopy;  Laterality: N/A;  1215pm, asa 3   CORONARY ANGIOPLASTY WITH STENT PLACEMENT  02/01/2016   CORONARY ARTERY BYPASS GRAFT  2003   LIMA to LAD,LIMA to distal RCA,SVG to ramus intermediate vessel.   ESOPHAGOGASTRODUODENOSCOPY (EGD) WITH PROPOFOL N/A 11/21/2022   Procedure: ESOPHAGOGASTRODUODENOSCOPY (EGD) WITH PROPOFOL;  Surgeon: Napoleon Form, MD;  Location: [facility] ENDOSCOPY;  Service: Gastroenterology;  Laterality: N/A;   ESOPHAGOGASTRODUODENOSCOPY (EGD) WITH PROPOFOL N/A 12/27/2022   Procedure: ESOPHAGOGASTRODUODENOSCOPY (EGD) WITH PROPOFOL;  Surgeon: Lanelle Bal, DO;  Location: [facility] ENDO SUITE;  Service: Endoscopy;  Laterality: N/A;   ESOPHAGOGASTRODUODENOSCOPY (EGD) WITH PROPOFOL N/A 02/15/2023   Procedure: ESOPHAGOGASTRODUODENOSCOPY (EGD) WITH PROPOFOL;  Surgeon: Lanelle Bal, DO;  Location: [facility] END[facility];  Service: Endoscopy;  Laterality: N/A;   FEMORAL REVISION Right 03/30/2019   Procedure: Femoral revision right total hip arthroplasty;  Surgeon: Ollen Gross, MD;  Location: [facility] ORS;  Service: Orthopedics;  Laterality: Right;    FOREIGN BODY REMOVAL Right 04/02/2014   Procedure: FOREIGN BODY REMOVAL RIGHT FOOT;  Surgeon: Dalia Heading, MD;  Location: [facility] ORS;  Service: General;  Laterality: Right;   JOINT REPLACEMENT     JOINT  REPLACEMENT     LUMBAR DISC SURGERY     POLYPECTOMY  03/13/2023   Procedure: POLYPECTOMY;  Surgeon: Lanelle Bal, DO;  Location: [facility] ENDO [facility]ITE;  Service: Endoscopy;;   REVISION TOTAL HIP ARTHROPLASTY Bilateral 2005-2012   right-left   RIGHT/LEFT HEART CATH AND CORONARY/GRAFT ANGIOGRAPHY N/A 09/27/2022   Procedure: RIGHT/LEFT HEART CATH AND CORONARY/GRAFT ANGIOGRAPHY;  Surgeon: Swazil[place], Peter M, MD;  Location: [facility] INVASIVE CV LAB;  Service: Cardiovascular;  Laterality: N/A;   SHOULDER OPEN ROTATOR CUFF REPAIR Right    TOTAL HIP ARTHROPLASTY Right 1999   TOTAL HIP ARTHROPLASTY Left 2008   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    Prior to Admission medications   Medication Sig Start Date End Date Taking? Authorizing Provider  acetaminophen (TYLENOL) 325 MG tablet Take 2 tablets (650 mg total) by mouth every 6 (six) hours as needed for mild pain (or Fever >/= 101). 02/16/23  Yes Emokpae, Courage, MD  atorvastatin (LIPITOR) 80 MG tablet TAKE 1 TABLET BY MOUTH EVERY DAY Patient taking differently: Take 80 mg by mouth daily. 11/30/22  Yes  [name], MD  Iron, Ferrous Sulfate, 325 (65 Fe) MG TABS Take 325 mg by mouth daily. 02/16/23  Yes Emokpae, Courage, MD  metoprolol tartrate (LOPRESSOR) 25 MG tablet Take 1 tablet (25 mg total) by mouth 2 (two) times daily. 03/14/23 06/12/23 Yes [name], NP  pantoprazole (PROTONIX) 40 MG tablet Take 1 tablet (40 mg total) by mouth 2 (two) times daily. 02/25/23 05/26/23 Yes Mahon, Frederik Schmidt, NP  Potassium Chloride ER 20 MEQ TBCR Take 1 tablet (20 mEq total) by mouth daily. 1 tab daily by mouth 02/16/23  Yes Emokpae, Courage, MD  silver sulfADIAZINE (SILVADENE) 1 % cream Apply 1 Application topically daily. 02/12/23  Yes [name], FNP  torsemide (DEMADEX) 20 MG  tablet Take 2 tablets (40 mg total) by mouth 2 (two) times daily. 02/16/23 02/11/24 Yes Emokpae, Courage, MD  nitroGLYCERIN (NITROSTAT) 0.4 MG SL tablet Place 1 tablet (0.4 mg total) under the tongue every 5 (five) minutes x 3 doses as needed for chest pain (if no relief after 3rd dose, proceed to the ED for an evaluation or call 911). 09/05/22   [name], MD    Current Facility-Administered Medications  Medication Dose Route Frequency Provider Last Rate Last Admin   atorvastatin (LIPITOR) tablet 80 mg  80 mg Oral Daily Lynnell Catalan, MD   80 mg at 04/01/23 9604   Chlorhexidine Gluconate Cloth 2 % PADS 6 each  6 each Topical Daily Lynnell Catalan, MD   6 each at 04/01/23 0906   docusate sodium (COLACE) capsule 100 mg  100 mg Oral BID PRN Lynnell Catalan, MD       feeding supplement (BOOST / RESOURCE BREEZE) liquid 1 Container  1 Container Oral TID BM Lynnell Catalan, MD   1 Container at 04/01/23 5409   melatonin tablet 5 mg  5 mg Oral QHS PRN Paliwal, Eliezer Lofts, MD   5 mg at 03/31/23 2319   norepinephrine (LEVOPHED) 4mg  in (0.016 mg/mL) premix infusion  0-40 mcg/min Intravenous Titrated Bethann Berkshire, MD 7.5 mL/hr at 04/01/23 1206 2 mcg/min at 04/01/23 1206   Oral care mouth rinse  15 mL Mouth Rinse PRN Agarwala, Daleen Bo, MD       pantoprazole (PROTONIX) EC tablet 40 mg  40 mg Oral BID Lynnell Catalan, MD   40 mg at 04/01/23 8119   polyethylene glycol (MIRALAX / GLYCOLAX) packet 17 g  17 g Oral Daily PRN Lynnell Catalan, MD        Allergies as of 03/31/2023 - Review Complete 03/31/2023  Allergen Reaction Noted   Augmentin [amoxicillin-pot clavulanate] Nausea And Vomiting and Other (See Comments) 01/14/2013    Family History  Problem Relation Age of Onset   Heart attack Brother    Colon cancer Neg Hx     Social History   Socioeconomic History   Marital status: Married    Spouse name: Larita Fife   Number of children: 2   Years of education: Not on file   Highest education level:  Bachelor's degree (e.g., BA, AB, BS)  Occupational History   Occupation: PT Holiday representative: RETIRED  Tobacco Use   Smoking status: Former    Current packs/day: 0.00    Average packs/day: 0.5 packs/day for 2.0 years (1.0 ttl pk-yrs)    Types: Cigarettes    Start date: 09/03/1964    Quit date: 09/03/1966    Years since quitting: 56.6   Smokeless tobacco: Never  Vaping Use   Vaping status: Never  Used  Substance and Sexual Activity   Alcohol use: Yes    Alcohol/week: 11.0 standard drinks of alcohol    Types: 7 Glasses of wine, 4 Cans of beer per week    Comment: a glass of wine with dinner/ beer on the weekend   Drug use: No   Sexual activity: Not on file  Other Topics Concern   Not on file  Social History Narrative   Lives with his wife.   Social Determinants of Health   Financial Resource Strain: Low Risk  (01/04/2023)   Overall Financial Resource Strain (CARDIA)    Difficulty of Paying Living Expenses: Not hard at all  Food Insecurity: No Food Insecurity (02/14/2023)   Hunger Vital Sign    Worried About Running Out of Food in the Last Year: Never true    Ran Out of Food in the Last Year: Never true  Transportation Needs: No Transportation Needs (02/14/2023)   PRAPARE - Administrator, Civil Service (Medical): No    Lack of Transportation (Non-Medical): No  Physical Activity: Unknown (11/25/2022)   Exercise Vital Sign    Days of Exercise per Week: 0 days    Minutes of Exercise per Session: Not on file  Stress: No Stress Concern Present (01/04/2023)   [employer] of Occupational Health - Occupational Stress Questionnaire    Feeling of Stress : Not at all  Social Connections: Moderately Integrated (11/25/2022)   Social Connection and Isolation Panel [NHANES]    Frequency of Communication with Friends and Family: Once a week    Frequency of Social Gatherings with Friends and Family: Once a week    Attends Religious Services: More than 4 times  per year    Active Member of [employer] or Organizations: Yes    Attends Engineer, structural: More than 4 times per year    Marital Status: Married  Catering manager Violence: Not At Risk (02/14/2023)   Humiliation, Afraid, Rape, and Kick questionnaire    Fear of Current or Ex-Partner: No    Emotionally Abused: No    Physically Abused: No    Sexually Abused: No    Review of Systems: Pertinent positive and negative review of systems were noted in the above HPI section.  All other review of systems was otherwise negative.   Physical Exam: Vital signs in last 24 hours: Temp:  [97.5 F (36.4 C)-97.8 F (36.6 C)] 97.6 F (36.4 C) (07/29 1145) Pulse Rate:  [39-124] 108 (07/29 1200) Resp:  [16-29] 28 (07/29 1200) BP: (82-115)/(53-77) 106/64 (07/29 1200) SpO2:  [65 %-100 %] 100 % (07/29 1200) Weight:  [88.1 kg] 88.1 kg (07/29 0500) Last BM Date :  (PTA) General:   Alert,  Well-developed, well-nourished elderly white male, pleasant and cooperative in NAD, wife at bedside Head:  Normocephalic and atraumatic. Eyes:  Sclera clear, no icterus.   Conjunctiva pale. Ears:  Normal auditory acuity. Nose:  No deformity, discharge,  or lesions. Mouth:  No deformity or lesions.   Neck:  Supple; no masses or thyromegaly. Lungs:  Clear throughout to auscultation.   No wheezes, crackles, or rhonchi.  Heart:  Regular rate and rhythm; systolic murmur Abdomen: Protuberant but soft, nontender, bowel sounds are present, no palpable mass or hepatosplenomegaly Rectal: Not done Msk:  Symmetrical without gross deformities. . Pulses:  Normal pulses noted. Extremities:  Without clubbing or edema. Neurologic:  Alert and  oriented x4;  grossly normal neurologically. Skin:  Intact without significant lesions or rashes.. Psych:  Alert and cooperative. Normal mood and affect.  Intake/Output from previous day: 07/28 0701 - 07/29 0700 In: 1665.5 [P.O.:720; I.V.:280.5; Blood:315; IV Piggyback:350] Out: 1300  [Urine:1300] Intake/Output this shift: Total I/O In: 90.5 [I.V.:90.5] Out: -   Lab Results: Recent Labs    03/31/23 1416 03/31/23 2133 04/01/23 0207  WBC 12.3* 12.9* 11.6*  HGB 8.4* 8.4* 8.3*  HCT 27.4* 25.2* 24.4*  PLT 199 182 180   BMET Recent Labs    03/31/23 1004 03/31/23 2135 04/01/23 0207  NA 130* 135 136  K 4.1 3.9 3.7  CL 98 101 101  CO2 25 23 27   GLUCOSE 115* 108* 103*  BUN 38* 34* 32*  CREATININE 1.94* 1.74* 1.66*  CALCIUM 7.8* 7.7* 7.7*   LFT Recent Labs    03/31/23 1004  PROT 5.8*  ALBUMIN 2.5*  AST 67*  ALT 41  ALKPHOS 284*  BILITOT 3.5*   PT/INR Recent Labs    03/31/23 1004  LABPROT 16.7*  INR 1.3*   Hepatitis Panel No results for input(s): "HEPBSAG", "HCVAB", "HEPAIGM", "HEPBIGM" in the last 72 hours.    IMPRESSION:  #24 82 year old white male presenting with profound weakness and hypotension, in setting of ischemic cardiomyopathy, mild pulmonary hypertension.  Found to have markedly elevated BNP and troponins and diagnosed with NSTEMI secondary to demand ischemia and decompensated congestive heart failure  #2 atrial fibrillation-not on anticoagulation secondary to iron deficiency anemia/GI blood loss  #3 history of chronic anemia with hemoglobin in the 7.5-8 range, recent iron studies normal on oral iron at home, normal B12 and folate but marked macrocytosis Drop in hemoglobin of about 1 g on admission and documented Hemoccult positive. No evidence for active GI bleeding.  he just had EGD and colonoscopy within the past 6 weeks per Dr. Marletta Lor with finding of mild gastritis, and a 3 mm nonbleeding duodenal ulcer/pylori negative And 2 small polyps were removed from the colon/tubular adenomas.  Not sure that his anemia is all secondary to GI blood loss with his marked macrocytosis concerning for a myelodysplastic process  He has not had capsule endoscopy to evaluate for possible small bowel AVMs as source for chronic GI blood  loss.  #4 moderate aortic stenosis #5 coronary artery disease status post remote CABG 2003  Plan; daily PPI Trend hemoglobin and transfuse to keep hemoglobin 7.5-8 Peripheral smear in process Consider hematology consultation  Can consider capsule endoscopy either inpatient or outpatient but would wait until he is off pressors and has had further improvement from a cardiovascular standpoint      Deserae Jennings PA-C 04/01/2023, 12:58 PM

## 2023-04-01 NOTE — Progress Notes (Signed)
Secure chat sent nurse, Gaspar Cola, RN. Automated consult received for USGPIV for vasopressor infusion. Patient currently has 2 upper arm PIVs. 2 USGPIVs to upper arms. Notified nurse Vasporessors should not be infused to upper arm PIVs. At this time nurse reports, "we're gonna turn it off and see how he tolerates it and I'll send you a message if we need a new USGIV." Instructed to place new consult if PIV is needed. Tomasita Morrow, RN VAST

## 2023-04-01 NOTE — Progress Notes (Signed)
NAME:  Leonard Cisneros, MRN:  [id number], DOB:  Nov 24, 1940, LOS: 1 ADMISSION DATE:  03/31/2023, CONSULTATION DATE: 03/31/2023 REFERRING MD: [facility], CHIEF COMPLAINT: Anemia  History of Present Illness:  82 year old man with increasing lethargy and dyspnea at home. Found to have hemoglobin of 6.2 in ED.  Transfusion started.  Hypotensive into the 80s requiring norepinephrine.  [facility]. Denies frank melena.  No vomiting. History of ischemic cardiomyopathy with triple-vessel disease.  Prior bypass 2003 . Medical management was recommended.  EF is normal.  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 Atrial fibrillation status post Watchman. History of chronic anemia with negative colonoscopy and EGD.  Ferritin has been normal in the past.  B12 and folate normal.  Pertinent  Medical History   Past Medical History:  Diagnosis Date   Aortic stenosis    Atrial flutter (HCC)    a. s/p remote ablation by Dr. Ladona Ridgel.   CAD (coronary artery disease)    a. s/p CABG 2003. b. 01/2016: unstable angina - s/p BMS to SVG-ramus intermediate, patent LIMA-mLAD, patent RIMA-dRCA.    CHF (congestive heart failure) (HCC)    Chronic lower back pain    Essential hypertension    Hyperlipidemia    Ischemic cardiomyopathy    a. Cath 01/2016 -  EF 50-55% with mild distal inferior hypocontractility.   Peripheral neuropathy 04/04/2014   Permanent atrial fibrillation (HCC)    Significant Hospital Events: Including procedures, antibiotic start and stop dates in addition to other pertinent events   7/28 received in transfer from [facility]. 7/29 remains on low-dose Levophed this a.m., hemoglobin stable posttransfusion.  No signs of bleeding overnight  Interim History / Subjective:  Seen sitting up in bed with complaints of generalized fatigue.  Denies any pain  Objective   Blood pressure 96/61, pulse 82, temperature 97.7 F  (36.5 C), temperature source Oral, resp. rate (!) 22, height 5\' 11"  (1.803 m), weight 88.1 kg, SpO2 95%.        Intake/Output Summary (Last 24 hours) at 04/01/2023 1011 Last data filed at 04/01/2023 1000 Gross per 24 hour  Intake 1725.15 ml  Output 1300 ml  Net 425.15 ml   Filed Weights   03/31/23 0947 04/01/23 0500  Weight: 86.2 kg 88.1 kg    Examination: General: Acute on chronic.  Elderly male lying in bed in no acute distress HEENT: Williams/AT, MM pink/moist, PERRL,  Neuro: Alert and oriented x 3, nonfocal CV: s1s2 regular rate and rhythm, no murmur, rubs, or gallops,  PULM: Clear to auscultation bilaterally, no increased work of breathing, no added breath sounds GI: soft, bowel sounds active in all 4 quadrants, non-tender, non-distended Extremities: warm/dry, no edema  Skin: no rashes or lesions  Resolved problems    Assessment & Plan:  Decompensated HFpEF due to recurrent anemia with history of known ICM with EF 60% and mild PHT -Echocardiogram January 2024 EF of 55 to 60%, no WMA, normal RV function -BNP 1121 Demand-related ischemia due to anemia with history of CAD s/p CABG 2003 -Troponin on admission 2150 peaked at 2438 Permanent A-fib s/p ablation -Unable to tolerate anticoagulation given recurrent GI bleeds Moderate AS -TTE January 2024 with moderate to severe AS Hyperlipidemia P: Continuous telemetry  Wean pressors for MAP goal greater than 65 Continue statin Heart healthy diet Strict intake and output  Daily weight to assess volume status Daily assessment for need to diurese  Closely monitor renal  function and electrolytes  Repeat echo GDMT as able  Acute on chronic anemia with history of recurrence GI bleeds and symptomatic anemia -Hemoglobin on admission 6.2, most recent hemoglobin prior to admission 7.7 on 03/15/2023 History of duodenal ulcer -Most recent EGD April 2024 with gastritis and nonbleeding duodenal ulcer with clean ulcer base P Consult  GI Consider possible Hematology consult  Transfuse per protocol Hemoglobin goal greater than 7 Monitor for signs of bleeding Twice daily PPI  Acute kidney injury -Baseline creatinine appears to range between 0.7-1.1.  Creatinine on admission 1.94 P: Follow renal function  Monitor urine output Trend Bmet Avoid nephrotoxins Ensure adequate renal perfusion   Chronic venous stasis dermatitis P: Local wound care  At risk malnutrition Hypoalbuminemia P: Protein supplementation  Best Practice (right click and "Reselect all SmartList Selections" daily)   Diet/type: Regular consistency (see orders) DVT prophylaxis: not indicated GI prophylaxis: PPI Lines: N/A Foley:  N/A Code Status:  full code Last date of multidisciplinary goals of care discussion [patient and wife updated at bedside]  CRITICAL CARE Performed by: Nastassia Bazaldua D. Harris  Total critical care time: 37 minutes  Critical care time was exclusive of separately billable procedures and treating other patients.  Critical care was necessary to treat or prevent imminent or life-threatening deterioration.  Critical care was time spent personally by me on the following activities: development of treatment plan with patient and/or surrogate as well as nursing, discussions with consultants, evaluation of patient's response to treatment, examination of patient, obtaining history from patient or surrogate, ordering and performing treatments and interventions, ordering and review of laboratory studies, ordering and review of radiographic studies, pulse oximetry, re-evaluation of patient's condition and participation in multidisciplinary rounds.  Chipper Koudelka D. Harris, NP-C [facility] Personal contact information can be found on Amion  If no contact or response made please call [phone] 04/01/2023, 11:00 AM

## 2023-04-02 ENCOUNTER — Inpatient Hospital Stay (HOSPITAL_COMMUNITY): Payer: Medicare HMO

## 2023-04-02 DIAGNOSIS — D509 Iron deficiency anemia, unspecified: Secondary | ICD-10-CM | POA: Insufficient documentation

## 2023-04-02 DIAGNOSIS — I5021 Acute systolic (congestive) heart failure: Secondary | ICD-10-CM | POA: Diagnosis not present

## 2023-04-02 DIAGNOSIS — N179 Acute kidney failure, unspecified: Secondary | ICD-10-CM

## 2023-04-02 DIAGNOSIS — R195 Other fecal abnormalities: Secondary | ICD-10-CM | POA: Diagnosis not present

## 2023-04-02 DIAGNOSIS — I5023 Acute on chronic systolic (congestive) heart failure: Secondary | ICD-10-CM

## 2023-04-02 DIAGNOSIS — D649 Anemia, unspecified: Secondary | ICD-10-CM | POA: Diagnosis not present

## 2023-04-02 DIAGNOSIS — D62 Acute posthemorrhagic anemia: Secondary | ICD-10-CM | POA: Diagnosis not present

## 2023-04-02 DIAGNOSIS — I4891 Unspecified atrial fibrillation: Secondary | ICD-10-CM | POA: Diagnosis not present

## 2023-04-02 DIAGNOSIS — I5043 Acute on chronic combined systolic (congestive) and diastolic (congestive) heart failure: Secondary | ICD-10-CM | POA: Insufficient documentation

## 2023-04-02 MED ORDER — FUROSEMIDE 10 MG/ML IJ SOLN
40.0000 mg | Freq: Two times a day (BID) | INTRAMUSCULAR | Status: DC
  Administered 2023-04-02 – 2023-04-05 (×7): 40 mg via INTRAVENOUS
  Filled 2023-04-02 (×7): qty 4

## 2023-04-02 MED ORDER — POTASSIUM CHLORIDE CRYS ER 20 MEQ PO TBCR
20.0000 meq | EXTENDED_RELEASE_TABLET | Freq: Once | ORAL | Status: AC
  Administered 2023-04-02: 20 meq via ORAL
  Filled 2023-04-02: qty 1

## 2023-04-02 MED ORDER — TORSEMIDE 20 MG PO TABS
20.0000 mg | ORAL_TABLET | Freq: Every day | ORAL | Status: DC
  Administered 2023-04-02: 20 mg via ORAL
  Filled 2023-04-02: qty 1

## 2023-04-02 MED ORDER — METOPROLOL TARTRATE 25 MG PO TABS
25.0000 mg | ORAL_TABLET | Freq: Two times a day (BID) | ORAL | Status: DC
  Administered 2023-04-02: 25 mg via ORAL
  Filled 2023-04-02: qty 1

## 2023-04-02 MED ORDER — METOPROLOL TARTRATE 12.5 MG HALF TABLET
12.5000 mg | ORAL_TABLET | Freq: Two times a day (BID) | ORAL | Status: AC
  Administered 2023-04-02 – 2023-04-11 (×18): 12.5 mg via ORAL
  Filled 2023-04-02 (×18): qty 1

## 2023-04-02 NOTE — Consult Note (Addendum)
Cardiology Consultation   Patient ID: [name] MRN: [id number]; DOB: 05/03/1941  Admit date: 03/31/2023 Date of Consult: 04/02/2023  PCP:  [name], FNP   [facility] Providers Cardiologist:  [name], MD  Electrophysiologist:  Lanier Prude, MD       Patient Profile:   Leonard Cisneros is a 82 y.o. male with a hx of perm perm Afib not on anticoag 2nd bleeding issues, CABG 2003 w/ occluded SVG-CFX at cath 09/2022, HTN, HLD, periph neuropathy, who is being seen 04/02/2023 for the evaluation of decreased EF at the request of Dr. Denese Killings.  History of Present Illness:   Mr. Leonard Cisneros reports that he has not been feeling like his usual self for a while, cannot determine exactly how long.  He has been tired, with noticing the fatigue getting worse over this past weekend.  He also noticed increased shortness of breath.  It started with dyspnea on exertion and progressed to the point that he could not get any rest at night, admits that may have been because of his shortness of breath.  He has no palpitations, no awareness of the atrial fibs.  He was not checking his heart rate, so does not know if his heart rate was elevated or not.  He also does not check his oxygen saturations so does not know if that was normal or not.  He has not had any chest pain.  He was at the beach this past weekend, and felt so bad, he did not even go outside for a day or 2.  Finally, he felt so bad he came into the hospital on 7/28.  Since being admitted, he has had potassium supplementation, IV antibiotics and was on Levophed until yesterday afternoon.  He feels much better now.  Of note, he has been having black tarry stools for at least a month.   Past Medical History:  Diagnosis Date   Aortic stenosis    Atrial flutter (HCC)    a. s/p remote ablation by Dr. Ladona Ridgel.   CAD (coronary artery disease)    a. s/p CABG 2003. b. 01/2016: unstable angina - s/p BMS to  SVG-ramus intermediate, patent LIMA-mLAD, patent RIMA-dRCA.    CHF (congestive heart failure) (HCC)    Chronic lower back pain    Essential hypertension    Hyperlipidemia    Ischemic cardiomyopathy    a. Cath 01/2016 -  EF 50-55% with mild distal inferior hypocontractility.   Peripheral neuropathy 04/04/2014   Permanent atrial fibrillation [facility])     Past Surgical History:  Procedure Laterality Date   BACK SURGERY     BIOPSY  11/21/2022   Procedure: BIOPSY;  Surgeon: Napoleon Form, MD;  Location: [facility] ENDOSCOPY;  Service: Gastroenterology;;   BIOPSY  02/15/2023   Procedure: BIOPSY;  Surgeon: Lanelle Bal, DO;  Location: [facility] ENDO SUITE;  Service: Endoscopy;;   CARDIAC CATHETERIZATION  ~ 2000; 2003   CARDIAC CATHETERIZATION N/A 02/01/2016   Procedure: Left Heart Cath and Cors/Grafts Angiography;  Surgeon: Lennette Bihari, MD;  Location: [facility] INVASIVE CV LAB;  Service: Cardiovascular;  Laterality: N/A;   CARDIAC CATHETERIZATION N/A 02/01/2016   Procedure: Coronary Stent Intervention;  Surgeon: Lennette Bihari, MD;  Location: [facility] INVASIVE CV LAB;  Service: Cardiovascular;  Laterality: N/A;   CATARACT EXTRACTION W/PHACO Left 02/09/2019   Procedure: CATARACT EXTRACTION PHACO AND INTRAOCULAR LENS PLACEMENT (IOC);  Surgeon: Fabio Pierce, MD;  Location: [facility] ORS;  Service: Ophthalmology;  Laterality: Left;  CDE: 27.70   CATARACT EXTRACTION W/PHACO Right 02/23/2019   Procedure: CATARACT EXTRACTION PHACO AND INTRAOCULAR LENS PLACEMENT RIGHT EYE;  Surgeon: Fabio Pierce, MD;  Location: [facility] ORS;  Service: Ophthalmology;  Laterality: Right;  CDE: 10.15   COLONOSCOPY  06/04/2002   Normal colon and rectum   COLONOSCOPY  07/08/2012   Procedure: COLONOSCOPY;  Surgeon: Corbin Ade, MD;  Location: [facility] END[facility]ITE;  Service: Endoscopy;  Laterality: N/A;  11:30   COLONOSCOPY N/A 10/02/2017   Procedure: COLONOSCOPY;  Surgeon: Corbin Ade, MD;  Location: [facility];  Service: Endoscopy;  Laterality: N/A;   10:30am   COLONOSCOPY WITH PROPOFOL N/A 03/13/2023   Procedure: COLONOSCOPY WITH PROPOFOL;  Surgeon: Lanelle Bal, DO;  Location: [facility];  Service: Endoscopy;  Laterality: N/A;  1215pm, asa 3   CORONARY ANGIOPLASTY WITH STENT PLACEMENT  02/01/2016   CORONARY ARTERY BYPASS GRAFT  2003   LIMA to LAD,LIMA to distal RCA,SVG to ramus intermediate vessel.   ESOPHAGOGASTRODUODENOSCOPY (EGD) WITH PROPOFOL N/A 11/21/2022   Procedure: ESOPHAGOGASTRODUODENOSCOPY (EGD) WITH PROPOFOL;  Surgeon: Napoleon Form, MD;  Location: [facility] ENDOSCOPY;  Service: Gastroenterology;  Laterality: N/A;   ESOPHAGOGASTRODUODENOSCOPY (EGD) WITH PROPOFOL N/A 12/27/2022   Procedure: ESOPHAGOGASTRODUODENOSCOPY (EGD) WITH PROPOFOL;  Surgeon: Lanelle Bal, DO;  Location: [facility];  Service: Endoscopy;  Laterality: N/A;   ESOPHAGOGASTRODUODENOSCOPY (EGD) WITH PROPOFOL N/A 02/15/2023   Procedure: ESOPHAGOGASTRODUODENOSCOPY (EGD) WITH PROPOFOL;  Surgeon: Lanelle Bal, DO;  Location: [facility];  Service: Endoscopy;  Laterality: N/A;   FEMORAL REVISION Right 03/30/2019   Procedure: Femoral revision right total hip arthroplasty;  Surgeon: Ollen Gross, MD;  Location: [facility] ORS;  Service: Orthopedics;  Laterality: Right;    FOREIGN BODY REMOVAL Right 04/02/2014   Procedure: FOREIGN BODY REMOVAL RIGHT FOOT;  Surgeon: Dalia Heading, MD;  Location: [facility] ORS;  Service: General;  Laterality: Right;   JOINT REPLACEMENT     JOINT REPLACEMENT     LUMBAR DISC SURGERY     POLYPECTOMY  03/13/2023   Procedure: POLYPECTOMY;  Surgeon: Lanelle Bal, DO;  Location: [facility];  Service: Endoscopy;;   REVISION TOTAL HIP ARTHROPLASTY Bilateral 2005-2012   right-left   RIGHT/LEFT HEART CATH AND CORONARY/GRAFT ANGIOGRAPHY N/A 09/27/2022   Procedure: RIGHT/LEFT HEART CATH AND CORONARY/GRAFT ANGIOGRAPHY;  Surgeon: [place], Peter M, MD;  Location: [facility] INVASIVE CV LAB;  Service: Cardiovascular;  Laterality: N/A;   SHOULDER  OPEN ROTATOR CUFF REPAIR Right    TOTAL HIP ARTHROPLASTY Right 1999   TOTAL HIP ARTHROPLASTY Left 2008   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     Home Medications:  Prior to Admission medications   Medication Sig Start Date End Date Taking? Authorizing Provider  acetaminophen (TYLENOL) 325 MG tablet Take 2 tablets (650 mg total) by mouth every 6 (six) hours as needed for mild pain (or Fever >/= 101). 02/16/23  Yes Emokpae, Courage, MD  atorvastatin (LIPITOR) 80 MG tablet TAKE 1 TABLET BY MOUTH EVERY DAY Patient taking differently: Take 80 mg by mouth daily. 11/30/22  Yes [name], MD  Iron, Ferrous Sulfate, 325 (65 Fe) MG TABS Take 325 mg by mouth daily. 02/16/23  Yes Emokpae, Courage, MD  metoprolol tartrate (LOPRESSOR) 25 MG tablet Take 1 tablet (25 mg total) by mouth 2 (two) times  daily. 03/14/23 06/12/23 Yes [name], NP  pantoprazole (PROTONIX) 40 MG tablet Take 1 tablet (40 mg total) by mouth 2 (two) times daily. 02/25/23 05/26/23 Yes Mahon, Frederik Schmidt, NP  Potassium Chloride ER 20 MEQ TBCR Take 1 tablet (20 mEq total) by mouth daily. 1 tab daily by mouth 02/16/23  Yes Emokpae, Courage, MD  silver sulfADIAZINE (SILVADENE) 1 % cream Apply 1 Application topically daily. 02/12/23  Yes [name], FNP  torsemide (DEMADEX) 20 MG tablet Take 2 tablets (40 mg total) by mouth 2 (two) times daily. 02/16/23 02/11/24 Yes Emokpae, Courage, MD  nitroGLYCERIN (NITROSTAT) 0.4 MG SL tablet Place 1 tablet (0.4 mg total) under the tongue every 5 (five) minutes x 3 doses as needed for chest pain (if no relief after 3rd dose, proceed to the ED for an evaluation or call 911). 09/05/22   [name], MD    Inpatient Medications: Scheduled Meds:  atorvastatin  80 mg Oral Daily   Chlorhexidine Gluconate  Cloth  6 each Topical Daily   feeding supplement  1 Container Oral TID BM   metoprolol tartrate  25 mg Oral BID   pantoprazole  40 mg Oral BID   torsemide  20 mg Oral Daily   Continuous Infusions:  sodium chloride     PRN Meds: docusate sodium, melatonin, mouth rinse, polyethylene glycol  Allergies:    Allergies  Allergen Reactions   Augmentin [Amoxicillin-Pot Clavulanate] Nausea And Vomiting and Other (See Comments)    Has patient had a PCN reaction causing immediate rash, facial/tongue/throat swelling, SOB or lightheadedness with hypotension: Unknown Has patient had a PCN reaction causing severe rash involving mucus membranes or skin necrosis: No Has patient had a PCN reaction that required hospitalization: No Has patient had a PCN reaction occurring within the last 10 years: No If all of the above answers are "NO", then may proceed with Cephalosporin use.     Social History:   Social History   Socioeconomic History   Marital status: Married    Spouse name: Larita Fife   Number of children: 2   Years of education: Not on file   Highest education level: Bachelor's degree (e.g., BA, AB, BS)  Occupational History   Occupation: PT Holiday representative: RETIRED  Tobacco Use   Smoking status: Former    Current packs/day: 0.00    Average packs/day: 0.5 packs/day for 2.0 years (1.0 ttl pk-yrs)    Types: Cigarettes    Start date: 09/03/1964    Quit date: 09/03/1966    Years since quitting: 56.6   Smokeless tobacco: Never  Vaping Use   Vaping status: Never Used  Substance and Sexual Activity   Alcohol use: Yes    Alcohol/week: 11.0 standard drinks of alcohol    Types: 7 Glasses of wine, 4 Cans of beer per week    Comment: a glass of wine with dinner/ beer on the weekend   Drug use: No   Sexual activity: Not on file  Other Topics Concern   Not on file  Social History Narrative   Lives with his wife.   Social Determinants of Health   Financial Resource Strain:  Low Risk  (01/04/2023)   Overall Financial Resource Strain (CARDIA)    Difficulty of Paying Living Expenses: Not hard at all  Food Insecurity: No Food Insecurity (02/14/2023)   Hunger Vital Sign    Worried About Running Out of Food in the Last Year: Never true    Ran  Out of Food in the Last Year: Never true  Transportation Needs: No Transportation Needs (02/14/2023)   PRAPARE - Administrator, Civil Service (Medical): No    Lack of Transportation (Non-Medical): No  Physical Activity: Unknown (11/25/2022)   Exercise Vital Sign    Days of Exercise per Week: 0 days    Minutes of Exercise per Session: Not on file  Stress: No Stress Concern Present (01/04/2023)   [employer] of Occupational Health - Occupational Stress Questionnaire    Feeling of Stress : Not at all  Social Connections: Moderately Integrated (11/25/2022)   Social Connection and Isolation Panel [NHANES]    Frequency of Communication with Friends and Family: Once a week    Frequency of Social Gatherings with Friends and Family: Once a week    Attends Religious Services: More than 4 times per year    Active Member of [employer] or Organizations: Yes    Attends Engineer, structural: More than 4 times per year    Marital Status: Married  Catering manager Violence: Not At Risk (02/14/2023)   Humiliation, Afraid, Rape, and Kick questionnaire    Fear of Current or Ex-Partner: No    Emotionally Abused: No    Physically Abused: No    Sexually Abused: No    Family History:   Family History  Problem Relation Age of Onset   Heart attack Brother    Colon cancer Neg Hx      ROS:  Please see the history of present illness.  All other ROS reviewed and negative.     Physical Exam/Data:   Vitals:   04/02/23 1300 04/02/23 1400 04/02/23 1500 04/02/23 1519  BP: 97/62 106/72 109/61 107/73  Pulse: (!) 101 97 99 99  Resp: (!) 21 (!) 23 (!) 21 20  Temp:    97.6 F (36.4 C)  TempSrc:    Oral  SpO2: 95% 95% (!) 77%  95%  Weight:      Height:        Intake/Output Summary (Last 24 hours) at 04/02/2023 1601 Last data filed at 04/02/2023 0753 Gross per 24 hour  Intake 480 ml  Output 950 ml  Net -470 ml      04/02/2023    7:05 AM 04/01/2023    5:00 AM 03/31/2023    9:47 AM  Last 3 Weights  Weight (lbs) 198 lb 10.2 oz 194 lb 3.6 oz 189 lb 15.9 oz  Weight (kg) 90.1 kg 88.1 kg 86.18 kg     Body mass index is 27.7 kg/m.  General:  Well nourished, well developed, elderly male, in no acute distress HEENT: normal Neck: no JVD Vascular: No carotid bruits; Distal pulses 2+ bilaterally Cardiac:  normal S1, S2; irregular rate and rhythm; 2/6 murmur  Lungs: Rales bilaterally, no wheezing, rhonchi  Abd: soft, nontender, no hepatomegaly  Ext: no edema Musculoskeletal:  No deformities, BUE and BLE strength normal and equal Skin: warm and dry  Neuro:  CNs 2-12 intact, no focal abnormalities noted Psych:  Normal affect   EKG:  The EKG was personally reviewed and demonstrates: Atrial fibrillation, low voltage extremity leads, no change from 02/14/2023 Telemetry:  Telemetry was personally reviewed and demonstrates: Atrial fibrillation, rate high normal to high  Relevant CV Studies:  ECHO: 09/25/2022  1. Left ventricular ejection fraction, by estimation, is 55 to 60%. The left ventricle has normal function. The left ventricle has no regional wall motion abnormalities. There is moderate concentric left ventricular hypertrophy.  Left ventricular diastolic parameters are indeterminate.   2. Right ventricular systolic function is normal. The right ventricular size is mildly enlarged. Tricuspid regurgitation signal is inadequate for assessing PA pressure.   3. Left atrial size was severely dilated.   4. Right atrial size was mildly dilated.   5. Left pleural effusion noted.   6. The mitral valve is degenerative. Mild mitral valve regurgitation. Severe mitral annular calcification.   7. The aortic valve is tricuspid.  There is moderate calcification of the aortic valve. Aortic valve regurgitation is not visualized. Moderate to  severe aortic valve stenosis. Aortic valve mean gradient measures 24.0 mmHg. AoV peak gradient measures 42.1 mmHg.  Dimentionless index 0.34.   8. Aortic dilatation noted. There is mild dilatation of the ascending aorta, measuring 39 mm.   9. The inferior vena cava is dilated in size with <50% respiratory variability, suggesting right atrial pressure of 15 mmHg.   Comparison(s): Prior images reviewed side by side. LVEF 55-60% range. Degenerative, calcific aortic stenosis of moderate to severe range with  similar gradients as prior study.   CARDIAC CATH: 09/27/2022   Ost LAD to Prox LAD lesion is 100% stenosed.   Ramus lesion is 100% stenosed.   Prox RCA to Mid RCA lesion is 60% stenosed.   Dist RCA lesion is 90% stenosed.   Origin to Prox Graft lesion is 100% stenosed.   LIMA graft was visualized by angiography and is large.   RIMA graft was visualized by non-selective angiography and is normal in caliber.   The graft exhibits no disease.   The graft exhibits no disease.   LV end diastolic pressure is mildly elevated.   Hemodynamic findings consistent with mild pulmonary hypertension.   There is moderate aortic valve stenosis.   3 vessel occlusive CAD. The native LCx is widely patent LIMA to the LAD is patent RIMA to the PDA is patent SVG to the ramus intermediate is occluded. This appears to be chronic Mild to Moderate aortic stenosis. AV mean gradient 18.5 mm Hg. AVA 2.5 cm squared with index 1.15. Elevated LV filling pressures. LVEDP 20 mm Hg. Mean PCWP 24 mm Hg with large V waves to 42 mm Hg Mild pulmonary HTN. PAP 56/17 with mean 31 mm Hg.    Plan: medical management. Continue diuresis. Would benefit greatly from correction of severe anemia. Will need to monitor Aortic stenosis serially going forward.  Diagnostic Dominance: Right   Laboratory Data:  High  Sensitivity Troponin:   Recent Labs  Lab 03/31/23 1004 03/31/23 1416 03/31/23 2133 03/31/23 2354 04/01/23 0542  TROPONINIHS 2,150* 1,742* 2,178* 2,438* 2,427*     Chemistry Recent Labs  Lab 03/31/23 2135 04/01/23 0207 04/02/23 0318  NA 135 136 135  K 3.9 3.7 4.0  CL 101 101 99  CO2 23 27 25   GLUCOSE 108* 103* 99  BUN 34* 32* 23  CREATININE 1.74* 1.66* 1.33*  CALCIUM 7.7* 7.7* 8.0*  MG 1.8  --   --   GFRNONAA 39* 41* 54*  ANIONGAP 11 8 11     Recent Labs  Lab 03/31/23 1004  PROT 5.8*  ALBUMIN 2.5*  AST 67*  ALT 41  ALKPHOS 284*  BILITOT 3.5*   Lipids No results for input(s): "CHOL", "TRIG", "HDL", "LABVLDL", "LDLCALC", "CHOLHDL" in the last 168 hours.  Hematology Recent Labs  Lab 03/31/23 2133 04/01/23 0207 04/02/23 0318  WBC 12.9* 11.6* 7.6  RBC 2.41* 2.33* 2.45*  2.49*  HGB 8.4* 8.3* 8.7*  HCT 25.2*  24.4* 26.2*  MCV 104.6* 104.7* 106.9*  MCH 34.9* 35.6* 35.5*  MCHC 33.3 34.0 33.2  RDW Not Measured 27.2* 25.3*  PLT 182 180 172   Thyroid No results for input(s): "TSH", "FREET4" in the last 168 hours.  BNP Recent Labs  Lab 03/31/23 1004  BNP 1,121.0*    DDimer No results for input(s): "DDIMER" in the last 168 hours.   Radiology/Studies:  [facility] Chest Port 1 View  Result Date: 03/31/2023 CLINICAL DATA:  Questionable sepsis.  Evaluate for abnormality. EXAM: PORTABLE CHEST 1 VIEW COMPARISON:  One-view chest 02/14/2023 FINDINGS: Heart is enlarged. Moderate pulmonary vascular congestion is present. Bilateral effusions are similar the prior exam. IMPRESSION: Cardiomegaly with moderate pulmonary vascular congestion and bilateral effusions compatible with congestive heart failure. Electronically Signed   By: Marin Roberts M.D.   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 -He has congestive heart failure, this has been  coming on for a while, his breathing has been gradually worsening for at least a couple of weeks. -At his office visit with Dr. Lalla Brothers 02/11/2023 his weight was 87.1 kg (192 pounds) - he needs additional diuresis, but may need pressure support w/ Levo or milrinone - SBP approx 100, but he had Lopressor 25 mg and torsemide 20 mg today. - will d/c the torsemide >> IV Lasix 40 mg bid - decrease the metop to 12.5 mg bid and see if BP will tolerate. Could also change to Coreg 3.125 mg bid - w/ lower BP and Cr higher than normal for him, will not try to add Entresto, spiro or other GDMT  2. Perm Afib - rate not much above normal, considering acute illness - continue BB as BP allows  3. Anemia w/ prob GIB - s/p transfusion 1 u PRBCs with Lasix 40 mg IV, w/ some improvement in H&H  - MCV chronically elevated - alk phos chronically elevated, peak 512 on 11/09/2022 - mgt per IM  Otherwise, per IM  Risk Assessment/Risk Scores:       New York Heart Association (NYHA) Functional Class NYHA Class IV  CHA2DS2-VASc Score = 5   This indicates a 7.2% annual risk of stroke. The patient's score is based upon: CHF History: 1 HTN History: 1 Diabetes History: 0 Stroke History: 0 Vascular Disease History: 1 Age Score: 2 Gender Score: 0    For questions or updates, please contact [facility] Please consult [email or web address] for contact info under    Signed, Theodore Demark, PA-C  04/02/2023 4:01 PM  Patient seen, examined. Available data reviewed. Agree with findings, assessment, and plan as outlined by Theodore Demark, PA-C.  Patient independently interviewed and examined.  He is alert, oriented, in no distress.  HEENT is normal, JVP is moderately elevated, lungs are clear bilaterally, heart is regular rate and rhythm with a 2/6 systolic murmur at the left sternal border, abdomen is soft and nontender, extremities have 1+ edema on the right with a dressing in place over the entire right  lower leg and trace edema on the left.  Skin is warm and dry with no rash.  The patient presents with symptomatic anemia, hemoglobin 6.2, now improved after packed red blood cell transfusion.  He does have signs of congestive heart failure with volume overload and reduced ejection fraction on his echocardiogram.  He has permanent atrial fibrillation not on anticoagulation  because of recurrent anemia and gastrointestinal bleeding.  I have reviewed consultant notes and there is concerned that he may have a hematologic abnormality rather than pure iron deficiency anemia based on his red blood cell indices.  Hematology consultation is pending.  From a cardiac perspective, I agree it is appropriate to diurese him and we will try him on furosemide 40 mg IV twice daily.  He probably has some extra volume on board from his blood transfusion.  I do not think he is a good candidate to work him up from an ischemic standpoint due to his comorbid conditions and severe anemia.  He is not having any anginal symptoms.  At the time of his recent heart catheterization in January 2024, he was noted to have patency of the LIMA to LAD and RIMA to PDA grafts.  It is very unlikely either of these grafts were occluded in the interim as they are arterial conduits.  The patient is noted to have mild to moderate aortic stenosis but his aortic valve is not severely stenosed and would not be playing a significant role in any of his current symptoms.  He is appropriate for continued surveillance of this.  Will follow-up tomorrow to see how he is responding to IV diuresis.  Tonny Bollman, M.D. 04/02/2023 5:16 PM

## 2023-04-02 NOTE — Progress Notes (Signed)
    Gastroenterology Inpatient Follow Up    Subjective: Patient passed 1 stool overnight that was brown/black.  He does not think that his stool was clearly sticky or tarry.  Objective: Vital signs in last 24 hours: Temp:  [97.6 F (36.4 C)-98.3 F (36.8 C)] 98.1 F (36.7 C) (07/30 0802) Pulse Rate:  [81-131] 127 (07/30 0900) Resp:  [18-28] 26 (07/30 0900) BP: (91-119)/(50-82) 118/82 (07/30 0900) SpO2:  [80 %-100 %] 95 % (07/30 0900) Weight:  [90.1 kg] 90.1 kg (07/30 0705) Last BM Date : 04/02/23  Intake/Output from previous day: 07/29 0701 - 07/30 0700 In: 587.1 [P.O.:480; I.V.:107.1] Out: 950 [Urine:950] Intake/Output this shift: Total I/O In: -  Out: 300 [Urine:300]  General appearance: alert and cooperative Resp: no increased WOB Cardio: tachycardic GI: non-tender  Lab Results: Recent Labs    03/31/23 2133 04/01/23 0207 04/02/23 0318  WBC 12.9* 11.6* 7.6  HGB 8.4* 8.3* 8.7*  HCT 25.2* 24.4* 26.2*  PLT 182 180 172   BMET Recent Labs    03/31/23 2135 04/01/23 0207 04/02/23 0318  NA 135 136 135  K 3.9 3.7 4.0  CL 101 101 99  CO2 23 27 25   GLUCOSE 108* 103* 99  BUN 34* 32* 23  CREATININE 1.74* 1.66* 1.33*  CALCIUM 7.7* 7.7* 8.0*   LFT Recent Labs    03/31/23 1004  PROT 5.8*  ALBUMIN 2.5*  AST 67*  ALT 41  ALKPHOS 284*  BILITOT 3.5*   PT/INR Recent Labs    03/31/23 1004  LABPROT 16.7*  INR 1.3*   Hepatitis Panel No results for input(s): "HEPBSAG", "HCVAB", "HEPAIGM", "HEPBIGM" in the last 72 hours. C-Diff No results for input(s): "CDIFFTOX" in the last 72 hours.  Studies/Results: No results found.  Medications: I have reviewed the patient's current medications. Scheduled:  atorvastatin  80 mg Oral Daily   Chlorhexidine Gluconate Cloth  6 each Topical Daily   feeding supplement  1 Container Oral TID BM   metoprolol tartrate  25 mg Oral BID   pantoprazole  40 mg Oral BID   Continuous:  sodium chloride     FUX:NATFTDDU  sodium, melatonin, mouth rinse, polyethylene glycol  Assessment/Plan: 82 year old male with history of PUD, atrial fibrillation (not on anticoagulation), CAD s/p CABG, HFpEF, and moderate-to-severe aortic stenosis presented with weakness and hypotension, found to have a heart failure exacerbation, demand ischemia, and anemia. He was found to be FOBT positive. Hemoglobin was 6.2 (decreased from 7.7 on 03/15/23) upon arrival to the [facility] ED. He received 2 units of PRBCs and hemoglobin has subsequently increased to 8.4.  His hemoglobin has remained stable since that blood transfusion.  Patient states that his stools are dark, but this may be due to chronic oral iron supplements. He recently had an EGD 02/15/23 that showed mild gastritis and a clean-based 3 mm duodenal ulcer with biopsies that were negative for H. pylori. Colonoscopy 03/13/23 showed nonbleeding internal hemorrhoids and 2 small tubular adenomas that were removed. Iron studies are not suggestive of iron deficiency anemia.  Reticulocyte index suggest hypoproliferation. - At this time we would recommend a hematology consult due his significant macrocytic anemia. If smear per review by hematology is still concerning for IDA, then could consider VCE or alternatively SBE +/- VCE.    LOS: 2 days   Imogene Burn 04/02/2023, 10:18 AM

## 2023-04-02 NOTE — Progress Notes (Signed)
NAME:  Leonard Cisneros, MRN:  [id number], DOB:  10/02/1940, LOS: 2 ADMISSION DATE:  03/31/2023, CONSULTATION DATE: 03/31/2023 REFERRING MD: ED [facility], CHIEF COMPLAINT: Anemia  History of Present Illness:  82 year old man with increasing lethargy and dyspnea at home. Found to have hemoglobin of 6.2 in ED.  Transfusion started.  Hypotensive into the 80s requiring norepinephrine.  [facility] for ICU admission. Denies frank melena.  No vomiting. History of ischemic cardiomyopathy with triple-vessel disease.  Prior bypass 2003 . Medical management was recommended.  EF is normal.  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 Atrial fibrillation status post Watchman. History of chronic anemia with negative colonoscopy and EGD.  Ferritin has been normal in the past.  B12 and folate normal.  Pertinent  Medical History   Past Medical History:  Diagnosis Date   Aortic stenosis    Atrial flutter (HCC)    a. s/p remote ablation by Dr. Ladona Ridgel.   CAD (coronary artery disease)    a. s/p CABG 2003. b. 01/2016: unstable angina - s/p BMS to SVG-ramus intermediate, patent LIMA-mLAD, patent RIMA-dRCA.    CHF (congestive heart failure) (HCC)    Chronic lower back pain    Essential hypertension    Hyperlipidemia    Ischemic cardiomyopathy    a. Cath 01/2016 -  EF 50-55% with mild distal inferior hypocontractility.   Peripheral neuropathy 04/04/2014   Permanent atrial fibrillation (HCC)    Significant Hospital Events: Including procedures, antibiotic start and stop dates in addition to other pertinent events   7/28 received in transfer from [facility]. 7/29 remains on low-dose Levophed this a.m., hemoglobin stable posttransfusion.  No signs of bleeding overnight 7/30 no bleeding, hgb stable, off levo since yesterday  Interim History / Subjective:  No acute events overnight Stable and off pressors Reports feeling fairly well without  complaints today  Objective   Blood pressure 102/67, pulse (!) 108, temperature 98.1 F (36.7 C), temperature source Oral, resp. rate 20, height 5\' 11"  (1.803 m), weight 90.1 kg, SpO2 94%.        Intake/Output Summary (Last 24 hours) at 04/02/2023 1119 Last data filed at 04/02/2023 0753 Gross per 24 hour  Intake 512.47 ml  Output 1250 ml  Net -737.53 ml   Filed Weights   03/31/23 0947 04/01/23 0500 04/02/23 0705  Weight: 86.2 kg 88.1 kg 90.1 kg      General:  well nourished M resting in bed in no acute distress HEENT: MM pink/moist Neuro: alert and oriented  CV: s1s2 rrr, no m/r/g PULM:  clear bilaterally on RA GI: soft, non-tender  Extremities: warm/dry, no edema  Skin: no rashes or lesions   Labs: Immature retic 19.6% Creatinine 1.33 BUN 23 WBC 7.6 Hgb 8.7 MCV 106  Resolved problems    Assessment & Plan:     Decompensated HFpEF due to recurrent anemia with history of known ICM with EF 60% and mild PHT Permanent A-fib s/p ablation Moderate AS Hyperlipidemia Echocardiogram January 2024 EF of 55 to 60%, no WMA, normal RV function, initial BNP 1121 -repeat echo today -given Lasix 40mg  7/28, appears euvolemic today, 950cc UOP yesterday -resume home torsemide at reduced dose 20mg  every day  -rate controlled on beta blocker  -on statin  -Unable to tolerate anticoagulation given recurrent GI bleeds -TTE January 2024 with moderate to severe AS -GDMT as able -PT consult -stable for transfer out of the ICU today   Demand-related ischemia due to  anemia with history of CAD s/p CABG 2003 -on statin and bb, holding Asa in the setting of possible GIB -Troponin on admission 2150 peaked at 2438 -on statin    Shock-cardiogenic vs hypovolemic  Resolved, off pressors -Continuous telemetry  -MAP goal greater than 65     Acute on chronic anemia with history of recurrence GI bleeds and symptomatic anemia History of duodenal ulcer Hemoglobin on admission 6.2,  most recent hemoglobin prior to admission 7.7 on 03/15/2023 Most recent EGD April 2024 with gastritis and nonbleeding duodenal ulcer with clean ulcer base Colonoscopy 03/2023 with non-bleeding internal hemorrhoids and 2 small tubular adenomas  -GI following, appreciate recs, do not feel that his anemia is clearly due to GI losses and recommend hematology consult for macrocytic 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  Improving, now 1.3 and making urine  -Follow renal function and UOP -trend renal indices, avoid nephrotoxins and ensure adequate renal perfusion   Chronic venous stasis dermatitis -Local wound care  At risk malnutrition Hypoalbuminemia -cont Protein supplementation  Best Practice (right click and "Reselect all SmartList Selections" daily)   Diet/type: Regular consistency (see orders) DVT prophylaxis: not indicated GI prophylaxis: PPI Lines: N/A Foley:  N/A Code Status:  full code Last date of multidisciplinary goals of care discussion [pending, improving]      Darcella Gasman Satomi Buda, PA-C [facility] See Amion for pager If no response to pager , please call [phone] until 7pm After 7:00 pm call Elink  [phone]

## 2023-04-02 NOTE — TOC Progression Note (Signed)
Transition of Care [facility]) - Progression Note    Patient Details  Name: Leonard Cisneros MRN: [id number] Date of Birth: 07-Oct-1940  Transition of Care [facility]) CM/SW Contact  Elliot Cousin, RN Phone Number: [phone] 04/02/2023, 8:51 AM  Clinical Narrative:  TOC CM spoke to pt and wife at bedside. Pt has cane and crutches at home. Wife transport pt to his appts. Wife states they had HH in the past. Per [facility], pt had [facility] in the past. Wife states they had [facility]. Contacted [facility] rep, Marylene Land to verify.      Expected Discharge Plan: Home w Home Health Services Barriers to Discharge: Continued Medical Work up  Expected Discharge Plan and Services   Discharge Planning Services: CM Consult Post Acute Care Choice: Home Health                                         Social Determinants of Health (SDOH) Interventions SDOH Screenings   Food Insecurity: No Food Insecurity (02/14/2023)  Housing: Low Risk  (02/14/2023)  Transportation Needs: No Transportation Needs (02/14/2023)  Utilities: Not At Risk (02/14/2023)  Alcohol Screen: Low Risk  (11/25/2022)  Depression (PHQ2-9): Low Risk  (02/12/2023)  Financial Resource Strain: Low Risk  (01/04/2023)  Physical Activity: Unknown (11/25/2022)  Social Connections: Moderately Integrated (11/25/2022)  Stress: No Stress Concern Present (01/04/2023)  Tobacco Use: Medium Risk (03/31/2023)    Readmission Risk Interventions    12/26/2022   10:54 AM 09/25/2022   12:42 PM  Readmission Risk Prevention Plan  Transportation Screening Complete Complete  PCP or Specialist Appt within 5-7 Days  Not Complete  Home Care Screening  Complete  Medication Review (RN CM)  Complete  HRI or Home Care Consult Complete   Social Work Consult for Recovery Care Planning/Counseling Complete   Palliative Care Screening Not Applicable   Medication Review Oceanographer) Complete

## 2023-04-02 NOTE — Evaluation (Signed)
Physical Therapy Evaluation Patient Details Name: Leonard Cisneros MRN: [id number] DOB: 1941-08-09 Today's Date: 04/02/2023  History of Present Illness  82 year old man brought to ED on 7/28 with increasing lethargy and dyspnea at home  Found to have hemoglobin of 6.2, transfusion started. Pt hypotensive. PMH: ischemic cardiomyopathy with triple-vessel disease, s/p bypass 2003. CAD, HTN, CAD, CHF, LE sores, R worse than L.   Clinical Impression  Pt indep and ambulating with axillary crutches at baseline.  Pt presenting with generalized weakness, deconditioning, and is functioning at minA. Pt used RW this session but was encouraged to bring his crutches from home as that he uses them at home. Pt with decreased ambulation tolerance this date however tolerated well overall for not being up in a couple of days. Suspect pt to progress well and be able to return home with spouse and HHPT services. Acute PT to cont to follow.        If plan is discharge home, recommend the following: A little help with walking and/or transfers;A little help with bathing/dressing/bathroom;Assist for transportation;Help with stairs or ramp for entrance   Can travel by private vehicle        Equipment Recommendations None recommended by PT  Recommendations for Other Services       Functional Status Assessment Patient has had a recent decline in their functional status and demonstrates the ability to make significant improvements in function in a reasonable and predictable amount of time.     Precautions / Restrictions Precautions Precautions: Fall Precaution Comments: R lower leg dressing with noted oozing/discharge Restrictions Weight Bearing Restrictions: No      Mobility  Bed Mobility Overal bed mobility: Needs Assistance Bed Mobility: Supine to Sit     Supine to sit: Min assist     General bed mobility comments: increased time, HOB elevated, minA for trunk elevation    Transfers Overall  transfer level: Needs assistance Equipment used: Rolling walker (2 wheels) Transfers: Sit to/from Stand Sit to Stand: Min assist           General transfer comment: minA to power up, increased time, verbal cues for safe hand placement, encouraged wife to bring in pt's crutches to use as he most familar with them. we would put a patient sticker them to minimize loosing them    Ambulation/Gait Ambulation/Gait assistance: Min assist Gait Distance (Feet): 60 Feet Assistive device: Rolling walker (2 wheels) Gait Pattern/deviations: Step-through pattern, Decreased stride length, Trunk flexed Gait velocity: dec Gait velocity interpretation: <1.31 ft/sec, indicative of household ambulator   General Gait Details: pt with noted DOE, SPO2 >88% on RA, verbal cues to stay in walker however with onset of fatigue pt with progressive trunk flexion  Stairs            Wheelchair Mobility     Tilt Bed    Modified Rankin (Stroke Patients Only)       Balance Overall balance assessment: Needs assistance Sitting-balance support: Feet supported, No upper extremity supported Sitting balance-Leahy Scale: Good     Standing balance support: Bilateral upper extremity supported, During functional activity, Reliant on assistive device for balance Standing balance-Leahy Scale: Poor Standing balance comment: reliant on RW for support                             Pertinent Vitals/Pain Pain Assessment Pain Assessment: No/denies pain    Home Living Family/patient expects to be discharged to:: Private residence Living Arrangements:  Spouse/significant other Available Help at Discharge: Family;Available 24 hours/day Type of Home: House Home Access: Stairs to enter Entrance Stairs-Rails: Right Entrance Stairs-Number of Steps: 14   Home Layout: One level Home Equipment: Cane - single Librarian, academic (2 wheels);Crutches Additional Comments: pt reports using crutches daily     Prior Function Prior Level of Function : Independent/Modified Independent;Driving             Mobility Comments: pt uses bilat axillary crutches at baseline, was driving ADLs Comments: indep, would water plants, feed the birds     Hand Dominance   Dominant Hand: Right    Extremity/Trunk Assessment   Upper Extremity Assessment Upper Extremity Assessment: Overall WFL for tasks assessed    Lower Extremity Assessment Lower Extremity Assessment: Generalized weakness    Cervical / Trunk Assessment Cervical / Trunk Assessment: Normal  Communication   Communication: HOH  Cognition Arousal/Alertness: Awake/alert Behavior During Therapy: WFL for tasks assessed/performed Overall Cognitive Status: Within Functional Limits for tasks assessed                                          General Comments General comments (skin integrity, edema, etc.): pt with bilat LE edema, R LE with gauze wrap around lower leg and foot due to oozing ulcer wounds    Exercises     Assessment/Plan    PT Assessment Patient needs continued PT services  PT Problem List Decreased strength;Decreased range of motion;Decreased mobility;Decreased balance;Decreased activity tolerance;Decreased knowledge of use of DME;Decreased safety awareness       PT Treatment Interventions Gait training;Stair training;Functional mobility training;Therapeutic activities;Therapeutic exercise;Balance training;Neuromuscular re-education    PT Goals (Current goals can be found in the Care Plan section)  Acute Rehab PT Goals Patient Stated Goal: home PT Goal Formulation: With patient/family Time For Goal Achievement: 04/16/23 Potential to Achieve Goals: Good    Frequency Min 1X/week     Co-evaluation               AM-PAC PT "6 Clicks" Mobility  Outcome Measure Help needed turning from your back to your side while in a flat bed without using bedrails?: None Help needed moving from lying on  your back to sitting on the side of a flat bed without using bedrails?: None Help needed moving to and from a bed to a chair (including a wheelchair)?: A Little Help needed standing up from a chair using your arms (e.g., wheelchair or bedside chair)?: A Little Help needed to walk in hospital room?: A Little Help needed climbing 3-5 steps with a railing? : A Lot 6 Click Score: 19    End of Session Equipment Utilized During Treatment: Gait belt Activity Tolerance: Patient limited by fatigue Patient left: in chair;with call bell/phone within reach;with family/visitor present Nurse Communication: Mobility status (RN present to push chair behind patient) PT Visit Diagnosis: Unsteadiness on feet (R26.81);Muscle weakness (generalized) (M62.81)    Time: 3086-5784 PT Time Calculation (min) (ACUTE ONLY): 30 min   Charges:   PT Evaluation $PT Eval Moderate Complexity: 1 Mod PT Treatments $Gait Training: 8-22 mins PT General Charges $$ ACUTE PT VISIT: 1 Visit         Lewis Shock, PT, DPT Acute Rehabilitation Services Secure chat preferred Office #: [phone]   Iona Hansen 04/02/2023, 1:06 PM

## 2023-04-02 NOTE — Consult Note (Signed)
[facility]  [phone]   HEMATOLOGY ONCOLOGY INPATIENT CONSULTATION   Leonard Cisneros  DOB: April 13, 1941  MR#: [id number]  CSN#: [id number]    Requesting Physician: [facility]  Patient Care Team: [name], FNP as PCP - General (Family Medicine) [name], MD as PCP - Cardiology (Cardiology) Lanier Prude, MD as PCP - Electrophysiology (Cardiology) Corbin Ade, MD (Gastroenterology) Clinton Gallant, RN as [facility] Care Management  Reason for consult: anemia   History of present illness:   82 yo male with PMH of atrial fibrillation not on anticoagulation, CABG in 2003, hypertension, peripheral neuropathy, previous history of GI bleeding, was admitted on 01/29/2023 for decompensated congestive heart failure, demand ischemia secondary to anemia, and severe venous insufficiency.  He has received 3 units of blood in the hospital.  I was called to evaluate his anemia.  He had mild chronic anemia for many years, but it has been much worse in the past 6 to 9 months, with significantly elevated MCV in 104-116 range..  He has had multiple hospital admission for severe anemia, with hemoglobin in 6's range and required blood transfusion.  He also had a multiple admission for congestive heart failure, which felt to be partially related to his anemia.  He has been seen by GI in the hospital, had a EGD on February 15, 2023 which showed mild gastritis and a clean-based 3 mm duodenal ulcer.  Colonoscopy on March 13, 2023 showed nonbleeding internal hemorrhoid and 2 small adenomas that was removed.  Iron study showed elevated ferritin and normal serum iron.  Serum B12 level was normal in 500s in January in the [facility] 2024.  Folate was also normal in March 2024.  Reticulocyte count has been low, in 50-60 range, haptoglobin was low in January 2024.  MEDICAL HISTORY:  Past Medical History:  Diagnosis Date   Aortic stenosis    Atrial  flutter (HCC)    a. s/p remote ablation by Dr. Ladona Ridgel.   CAD (coronary artery disease)    a. s/p CABG 2003. b. 01/2016: unstable angina - s/p BMS to SVG-ramus intermediate, patent LIMA-mLAD, patent RIMA-dRCA.    CHF (congestive heart failure) (HCC)    Chronic lower back pain    Essential hypertension    Hyperlipidemia    Ischemic cardiomyopathy    a. Cath 01/2016 -  EF 50-55% with mild distal inferior hypocontractility.   Peripheral neuropathy 04/04/2014   Permanent atrial fibrillation (HCC)     SURGICAL HISTORY: Past Surgical History:  Procedure Laterality Date   BACK SURGERY     BIOPSY  11/21/2022   Procedure: BIOPSY;  Surgeon: Napoleon Form, MD;  Location: [facility] ENDOSCOPY;  Service: Gastroenterology;;   BIOPSY  02/15/2023   Procedure: BIOPSY;  Surgeon: Lanelle Bal, DO;  Location: [facility] ENDO SUITE;  Service: Endoscopy;;   CARDIAC CATHETERIZATION  ~ 2000; 2003   CARDIAC CATHETERIZATION N/A 02/01/2016   Procedure: Left Heart Cath and Cors/Grafts Angiography;  Surgeon: Lennette Bihari, MD;  Location: [facility] INVASIVE CV LAB;  Service: Cardiovascular;  Laterality: N/A;   CARDIAC CATHETERIZATION N/A 02/01/2016   Procedure: Coronary Stent Intervention;  Surgeon: Lennette Bihari, MD;  Location: [facility] INVASIVE CV LAB;  Service: Cardiovascular;  Laterality: N/A;   CATARACT EXTRACTION W/PHACO Left 02/09/2019   Procedure: CATARACT EXTRACTION PHACO AND INTRAOCULAR LENS PLACEMENT (IOC);  Surgeon: Fabio Pierce, MD;  Location: [facility] ORS;  Service: Ophthalmology;  Laterality: Left;  CDE: 27.70   CATARACT  EXTRACTION W/PHACO Right 02/23/2019   Procedure: CATARACT EXTRACTION PHACO AND INTRAOCULAR LENS PLACEMENT RIGHT EYE;  Surgeon: Fabio Pierce, MD;  Location: [facility] ORS;  Service: Ophthalmology;  Laterality: Right;  CDE: 10.15   COLONOSCOPY  06/04/2002   Normal colon and rectum   COLONOSCOPY  07/08/2012   Procedure: COLONOSCOPY;  Surgeon: Corbin Ade, MD;  Location: [facility] ENDO SUITE;  Service: Endoscopy;   Laterality: N/A;  11:30   COLONOSCOPY N/A 10/02/2017   Procedure: COLONOSCOPY;  Surgeon: Corbin Ade, MD;  Location: [facility] ENDO [facility]ITE;  Service: Endoscopy;  Laterality: N/A;  10:30am   COLONOSCOPY WITH PROPOFOL N/A 03/13/2023   Procedure: COLONOSCOPY WITH PROPOFOL;  Surgeon: Lanelle Bal, DO;  Location: [facility] END[facility];  Service: Endoscopy;  Laterality: N/A;  1215pm, asa 3   CORONARY ANGIOPLASTY WITH STENT PLACEMENT  02/01/2016   CORONARY ARTERY BYPASS GRAFT  2003   LIMA to LAD,LIMA to distal RCA,SVG to ramus intermediate vessel.   ESOPHAGOGASTRODUODENOSCOPY (EGD) WITH PROPOFOL N/A 11/21/2022   Procedure: ESOPHAGOGASTRODUODENOSCOPY (EGD) WITH PROPOFOL;  Surgeon: Napoleon Form, MD;  Location: [facility] ENDOSCOPY;  Service: Gastroenterology;  Laterality: N/A;   ESOPHAGOGASTRODUODENOSCOPY (EGD) WITH PROPOFOL N/A 12/27/2022   Procedure: ESOPHAGOGASTRODUODENOSCOPY (EGD) WITH PROPOFOL;  Surgeon: Lanelle Bal, DO;  Location: [facility];  Service: Endoscopy;  Laterality: N/A;   ESOPHAGOGASTRODUODENOSCOPY (EGD) WITH PROPOFOL N/A 02/15/2023   Procedure: ESOPHAGOGASTRODUODENOSCOPY (EGD) WITH PROPOFOL;  Surgeon: Lanelle Bal, DO;  Location: [facility];  Service: Endoscopy;  Laterality: N/A;   FEMORAL REVISION Right 03/30/2019   Procedure: Femoral revision right total hip arthroplasty;  Surgeon: Ollen Gross, MD;  Location: [facility] ORS;  Service: Orthopedics;  Laterality: Right;    FOREIGN BODY REMOVAL Right 04/02/2014   Procedure: FOREIGN BODY REMOVAL RIGHT FOOT;  Surgeon: Dalia Heading, MD;  Location: [facility] ORS;  Service: General;  Laterality: Right;   JOINT REPLACEMENT     JOINT REPLACEMENT     LUMBAR DISC SURGERY     POLYPECTOMY  03/13/2023   Procedure: POLYPECTOMY;  Surgeon: Lanelle Bal, DO;  Location: [facility];  Service: Endoscopy;;   REVISION TOTAL HIP ARTHROPLASTY Bilateral 2005-2012   right-left   RIGHT/LEFT HEART CATH AND CORONARY/GRAFT ANGIOGRAPHY N/A 09/27/2022    Procedure: RIGHT/LEFT HEART CATH AND CORONARY/GRAFT ANGIOGRAPHY;  Surgeon: [place], Peter M, MD;  Location: [facility] INVASIVE CV LAB;  Service: Cardiovascular;  Laterality: N/A;   SHOULDER OPEN ROTATOR CUFF REPAIR Right    TOTAL HIP ARTHROPLASTY Right 1999   TOTAL HIP ARTHROPLASTY Left 2008   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    SOCIAL HISTORY: Social History   Socioeconomic History   Marital status: Married    Spouse name: Larita Fife   Number of children: 2   Years of education: Not on file   Highest education level: Bachelor's degree (e.g., BA, AB, BS)  Occupational History   Occupation: PT Holiday representative: RETIRED  Tobacco Use   Smoking status: Former    Current packs/day: 0.00    Average packs/day: 0.5 packs/day for 2.0 years (1.0 ttl pk-yrs)    Types: Cigarettes    Start date: 09/03/1964    Quit date: 09/03/1966    Years since quitting: 56.6   Smokeless tobacco: Never  Vaping Use   Vaping status: Never Used  Substance and Sexual Activity   Alcohol use: Yes    Alcohol/week: 11.0 standard drinks of alcohol    Types: 7 Glasses of wine, 4 Cans of beer per week    Comment: a glass of wine with dinner/ beer on the weekend   Drug use: No   Sexual activity: Not on file  Other Topics Concern   Not on file  Social History Narrative   Lives with his wife.   Social Determinants of Health   Financial Resource Strain: Low Risk  (01/04/2023)   Overall Financial Resource Strain (CARDIA)    Difficulty of Paying Living Expenses: Not hard at all  Food Insecurity: No Food Insecurity (02/14/2023)   Hunger Vital Sign    Worried About Running Out of Food in the Last Year: Never true    Ran Out of Food in the Last Year: Never true  Transportation Needs: No Transportation Needs (02/14/2023)    PRAPARE - Administrator, Civil Service (Medical): No    Lack of Transportation (Non-Medical): No  Physical Activity: Unknown (11/25/2022)   Exercise Vital Sign    Days of Exercise per Week: 0 days    Minutes of Exercise per Session: Not on file  Stress: No Stress Concern Present (01/04/2023)   [employer] of Occupational Health - Occupational Stress Questionnaire    Feeling of Stress : Not at all  Social Connections: Moderately Integrated (11/25/2022)   Social Connection and Isolation Panel [NHANES]    Frequency of Communication with Friends and Family: Once a week    Frequency of Social Gatherings with Friends and Family: Once a week    Attends Religious Services: More than 4 times per year    Active Member of [employer] or Organizations: Yes    Attends Engineer, structural: More than 4 times per year    Marital Status: Married  Catering manager Violence: Not At Risk (02/14/2023)   Humiliation, Afraid, Rape, and Kick questionnaire    Fear of Current or Ex-Partner: No    Emotionally Abused: No    Physically Abused: No    Sexually Abused: No    FAMILY HISTORY: Family History  Problem Relation Age of Onset   Heart attack Brother    Colon cancer Neg Hx     ALLERGIES:  is allergic to augmentin [amoxicillin-pot clavulanate].  MEDICATIONS:  Current Facility-Administered Medications  Medication Dose Route Frequency Provider Last Rate Last Admin   0.9 %  sodium chloride infusion  250 mL Intravenous Continuous Agarwala, Ravi, MD       atorvastatin (LIPITOR) tablet 80 mg  80 mg Oral Daily Agarwala, Daleen Bo, MD   80 mg at 04/02/23 0944   Chlorhexidine Gluconate Cloth 2 % PADS 6 each  6 each Topical Daily Agarwala, Daleen Bo, MD   6 each at 04/02/23 0945   docusate sodium (COLACE) capsule 100 mg  100 mg Oral BID PRN Lynnell Catalan, MD       feeding supplement (BOOST / RESOURCE BREEZE) liquid 1 Container  1 Container Oral TID BM Lynnell Catalan, MD   1 Container at 04/02/23 1323    furosemide (LASIX) injection 40 mg  40 mg Intravenous BID Barrett, Rhonda G, PA-C   40 mg at 04/02/23 1944   melatonin tablet 5 mg  5 mg Oral QHS PRN Paliwal, Aditya, MD   5 mg at 03/31/23 2319   metoprolol tartrate (LOPRESSOR) tablet 12.5 mg  12.5 mg Oral BID Barrett, Joline Salt, PA-C  Oral care mouth rinse  15 mL Mouth Rinse PRN Lynnell Catalan, MD       pantoprazole (PROTONIX) EC tablet 40 mg  40 mg Oral BID Lynnell Catalan, MD   40 mg at 04/02/23 0944   polyethylene glycol (MIRALAX / GLYCOLAX) packet 17 g  17 g Oral Daily PRN Lynnell Catalan, MD        REVIEW OF SYSTEMS:   Constitutional: Denies fevers, chills or abnormal night sweats, (+) fatigue  Eyes: Denies blurriness of vision, double vision or watery eyes Ears, nose, mouth, throat, and face: Denies mucositis or sore throat Respiratory: Denies cough, dyspnea or wheezes, (+) dyspnea on exertion  Cardiovascular: Denies palpitation, chest discomfort, (+) lower extremity swelling Gastrointestinal:  Denies nausea, heartburn or change in bowel habits Skin: Denies abnormal skin rashes Lymphatics: Denies new lymphadenopathy or easy bruising Neurological:Denies numbness, tingling or new weaknesses Behavioral/Psych: Mood is stable, no new changes  All other systems were reviewed with the patient and are negative.  PHYSICAL EXAMINATION: ECOG PERFORMANCE STATUS: 3 - Symptomatic, >50% confined to bed  Vitals:   04/02/23 1519 04/02/23 1900  BP: 107/73 109/72  Pulse: 99   Resp: 20   Temp: 97.6 F (36.4 C) 97.8 F (36.6 C)  SpO2: 95%    Filed Weights   03/31/23 0947 04/01/23 0500 04/02/23 0705  Weight: 189 lb 15.9 oz (86.2 kg) 194 lb 3.6 oz (88.1 kg) 198 lb 10.2 oz (90.1 kg)    GENERAL:alert, no distress and comfortable SKIN: skin color, texture, turgor are normal, no rashes or significant lesions EYES: normal, conjunctiva are pink and non-injected, sclera clear OROPHARYNX:no exudate, no erythema and lips, buccal mucosa, and  tongue normal  NECK: supple, thyroid normal size, non-tender, without nodularity LYMPH:  no palpable lymphadenopathy in the cervical, axillary or inguinal LUNGS: clear to auscultation and percussion with normal breathing effort HEART: regular rate & rhythm and no murmurs, (+) bilateral lower extremity edema ABDOMEN:abdomen soft, non-tender and normal bowel sounds Musculoskeletal:no cyanosis of digits and no clubbing  PSYCH: alert & oriented x 3 with fluent speech NEURO: no focal motor/sensory deficits  LABORATORY DATA:  I have reviewed the data as listed Lab Results  Component Value Date   WBC 7.6 04/02/2023   HGB 8.7 (L) 04/02/2023   HCT 26.2 (L) 04/02/2023   MCV 106.9 (H) 04/02/2023   PLT 172 04/02/2023   Recent Labs    09/24/22 1020 09/25/22 0442 12/25/22 1054 12/26/22 0630 12/29/22 0651 01/10/23 0958 02/11/23 1156 02/13/23 0824 02/14/23 0913 02/15/23 0525 03/31/23 1004 03/31/23 2135 04/01/23 0207 04/02/23 0318  NA 131*   < >  --    < >  --  139   < >  --  132*   < > 130* 135 136 135  K 2.9*   < >  --    < >  --  3.5   < >  --  3.4*   < > 4.1 3.9 3.7 4.0  CL 94*   < >  --    < >  --  103   < >  --  97*   < > 98 101 101 99  CO2 26   < >  --    < >  --  27   < >  --  26   < > 25 23 27 25   GLUCOSE 120*   < >  --    < >  --  106*   < >  --  117*   < > 115* 108* 103* 99  BUN 15   < >  --    < >  --  15   < >  --  12   < > 38* 34* 32* 23  CREATININE 0.85   < >  --    < >  --  1.01   < >  --  1.14   < > 1.94* 1.74* 1.66* 1.33*  CALCIUM 8.4*   < >  --    < >  --  8.3*   < >  --  8.1*   < > 7.8* 7.7* 7.7* 8.0*  GFRNONAA >60   < >  --    < >  --   --   --   --  >60   < > 34* 39* 41* 54*  PROT 6.4*   < >  --    < > 5.2* 5.4*  --   --  6.1*  --  5.8*  --   --   --   ALBUMIN 3.4*   < >  --    < > 2.6*  --   --   --  3.1*  --  2.5*  --   --   --   AST 33   < >  --    < > 51* 38*  --   --  42*  --  67*  --   --   --   ALT 29   < >  --    < > 49* 34  --   --  31  --  41  --   --    --   ALKPHOS 266*   < >  --    < > 243*  --   --  436* 356*  --  284*  --   --   --   BILITOT 3.6*   < > 3.2*   < > 3.7* 2.3*  --   --  3.1*  --  3.5*  --   --   --   BILIDIR 1.1*  --  1.0*  --  1.3*  --   --   --   --   --   --   --   --   --   IBILI 2.5*  --  2.2*  --  2.4*  --   --   --   --   --   --   --   --   --    < > = values in this interval not displayed.    RADIOGRAPHIC STUDIES: I have personally reviewed the radiological images as listed and agreed with the findings in the report. ECHOCARDIOGRAM COMPLETE  Result Date: 04/02/2023    ECHOCARDIOGRAM REPORT   Patient Name:   Leonard Cisneros Date of Exam: 04/02/2023 Medical Rec #:  [id number]        Height:       71.0 in Accession #:    [id number]       Weight:       198.6 lb Date of Birth:  1941/08/01       BSA:          2.102 m Patient Age:    81 years         BP:           110/74 mmHg Patient Gender: M  HR:           100 bpm. Exam Location:  Inpatient Procedure: 2D Echo, Cardiac Doppler and Color Doppler Indications:    CHF I50.21  History:        Patient has prior history of Echocardiogram examinations, most                 recent 09/25/2022. CAD, ETOH abuse, Arrythmias:Atrial                 Fibrillation; Risk Factors:Hypertension, Dyslipidemia and Former                 Smoker.  Sonographer:    Dondra Prader RVT RCS Referring Phys: [id number] WHITNEY D HARRIS IMPRESSIONS  1. Left ventricular ejection fraction, by estimation, is 30 to 35%. The left ventricle has moderately decreased function. The left ventricle demonstrates global hypokinesis. The left ventricular internal cavity size was mildly dilated. There is mild concentric left ventricular hypertrophy. Left ventricular diastolic parameters are indeterminate.  2. Right ventricular systolic function is mildly reduced. The right ventricular size is mildly enlarged. Tricuspid regurgitation signal is inadequate for assessing PA pressure.  3. Left atrial size was severely dilated.  4.  Right atrial size was moderately dilated.  5. The mitral valve is normal in structure. Mild mitral valve regurgitation. Mild mitral stenosis. The mean mitral valve gradient is 6.0 mmHg. Moderate mitral annular calcification.  6. The aortic valve is tricuspid. There is moderate calcification of the aortic valve. Aortic valve regurgitation is not visualized. Mild aortic valve stenosis. Aortic valve mean gradient measures 14.0 mmHg.  7. The inferior vena cava is dilated in size with <50% respiratory variability, suggesting right atrial pressure of 15 mmHg. FINDINGS  Left Ventricle: Left ventricular ejection fraction, by estimation, is 30 to 35%. The left ventricle has moderately decreased function. The left ventricle demonstrates global hypokinesis. The left ventricular internal cavity size was mildly dilated. There is mild concentric left ventricular hypertrophy. Left ventricular diastolic parameters are indeterminate. Right Ventricle: The right ventricular size is mildly enlarged. No increase in right ventricular wall thickness. Right ventricular systolic function is mildly reduced. Tricuspid regurgitation signal is inadequate for assessing PA pressure. Left Atrium: Left atrial size was severely dilated. Right Atrium: Right atrial size was moderately dilated. Pericardium: There is no evidence of pericardial effusion. Mitral Valve: The mitral valve is normal in structure. There is mild calcification of the mitral valve leaflet(s). Moderate mitral annular calcification. Mild mitral valve regurgitation. Mild mitral valve stenosis. MV peak gradient, 11.6 mmHg. The mean mitral valve gradient is 6.0 mmHg. Tricuspid Valve: The tricuspid valve is normal in structure. Tricuspid valve regurgitation is trivial. Aortic Valve: The aortic valve is tricuspid. There is moderate calcification of the aortic valve. Aortic valve regurgitation is not visualized. Mild aortic stenosis is present. Aortic valve mean gradient measures 14.0  mmHg. Aortic valve peak gradient measures 24.1 mmHg. Aortic valve area, by VTI measures 2.13 cm. Pulmonic Valve: The pulmonic valve was normal in structure. Pulmonic valve regurgitation is not visualized. Aorta: The aortic root is normal in size and structure. Venous: The inferior vena cava is dilated in size with less than 50% respiratory variability, suggesting right atrial pressure of 15 mmHg. IAS/Shunts: No atrial level shunt detected by color flow Doppler.  LEFT VENTRICLE PLAX 2D LVIDd:         6.10 cm LVIDs:         5.20 cm LV PW:  1.40 cm LV IVS:        1.30 cm LVOT diam:     2.20 cm LV SV:         86 LV SV Index:   41 LVOT Area:     3.80 cm  RIGHT VENTRICLE            IVC RV Basal diam:  4.50 cm    IVC diam: 3.20 cm RV Mid diam:    3.00 cm RV S prime:     8.51 cm/s TAPSE (M-mode): 0.8 cm LEFT ATRIUM              Index        RIGHT ATRIUM           Index LA diam:        5.90 cm  2.81 cm/m   RA Area:     27.20 cm LA Vol (A2C):   169.0 ml 80.38 ml/m  RA Volume:   96.85 ml  46.07 ml/m LA Vol (A4C):   140.0 ml 66.59 ml/m LA Biplane Vol: 169.0 ml 80.38 ml/m  AORTIC VALVE                     PULMONIC VALVE AV Area (Vmax):    2.32 cm      PV Vmax:       0.84 m/s AV Area (Vmean):   2.31 cm      PV Peak grad:  2.9 mmHg AV Area (VTI):     2.13 cm AV Vmax:           245.33 cm/s AV Vmean:          168.667 cm/s AV VTI:            0.401 m AV Peak Grad:      24.1 mmHg AV Mean Grad:      14.0 mmHg LVOT Vmax:         149.75 cm/s LVOT Vmean:        102.375 cm/s LVOT VTI:          0.225 m LVOT/AV VTI ratio: 0.56  AORTA Ao Root diam: 3.20 cm Ao Asc diam:  3.50 cm MITRAL VALVE MV Area (PHT): 4.94 cm     SHUNTS MV Area VTI:   3.09 cm     Systemic VTI:  0.22 m MV Peak grad:  11.6 mmHg    Systemic Diam: 2.20 cm MV Mean grad:  6.0 mmHg MV Vmax:       1.70 m/s MV Vmean:      110.0 cm/s MV Decel Time: 154 msec MR Peak grad: 88.4 mmHg MR Mean grad: 61.0 mmHg MR Vmax:      470.00 cm/s MR Vmean:     368.0 cm/s MV E  velocity: 152.00 cm/s MV A velocity: 37.70 cm/s MV E/A ratio:  4.03 Dalton McleanMD Electronically signed by Wilfred Lacy Signature Date/Time: 04/02/2023/4:37:17 PM    Final    DG Chest Port 1 View  Result Date: 03/31/2023 CLINICAL DATA:  Questionable sepsis.  Evaluate for abnormality. EXAM: PORTABLE CHEST 1 VIEW COMPARISON:  One-view chest 02/14/2023 FINDINGS: Heart is enlarged. Moderate pulmonary vascular congestion is present. Bilateral effusions are similar the prior exam. IMPRESSION: Cardiomegaly with moderate pulmonary vascular congestion and bilateral effusions compatible with congestive heart failure. Electronically Signed   By: Marin Roberts M.D.   On: 03/31/2023 10:32    ASSESSMENT & PLAN:  83 yo male with   Severe  symptomatic macrocytic anemia Systolic congestive heart failure with EF 25%, significant decreased from 60% on prior echo  Atrial fibrillation, not on anticoagulation due to history of GI bleeding AKI, improving  Mild hyperbilirubinemia .  Recommendations: -I have reviewed his chart especially lab including his previous anemia workup.  No lab evidence of iron deficiency, previous B12 and folate level has been normal -Reticulocyte count has been low, which indicating marrow production.  His previous haptoglobin was normal, no lab evidence of hemolysis. -I will repeat B12, methylmalonic acid level tomorrow, and will obtain multiple myeloma panel, and a serum light chain -Given the lack of significant iron deficiency, and no overt GI bleeding, I think his anemia is unlikely related to GI bleeding. -I will recommend a bone marrow biopsy to rule out primary bone marrow disease, I will place the order for IR CT-guided bone marrow biopsy.  I explained the procedure to patient, he is agreeable. -I will follow-up after his bone marrow biopsy result is back.  -Continue blood transfusion to keep hemoglobin above 8, due to his significant heart failure. -not sure if  cardiology were consulted cardiac MRI for the evaluation of his systolic congestive heart failure and rule out cardiac amyloid  -Discharge plan per primary team, I will see him back in office  -With patient's permission, I will call his wife tomorrow, and updated her.  All questions were answered. The patient knows to call the clinic with any problems, questions or concerns.      Malachy Mood, MD 04/02/2023

## 2023-04-02 NOTE — Progress Notes (Signed)
*  PRELIMINARY RESULTS* Echocardiogram 2D Echocardiogram has been performed.  Leonard Cisneros 04/02/2023, 10:37 AM

## 2023-04-03 DIAGNOSIS — I2489 Other forms of acute ischemic heart disease: Secondary | ICD-10-CM

## 2023-04-03 DIAGNOSIS — I4891 Unspecified atrial fibrillation: Secondary | ICD-10-CM

## 2023-04-03 DIAGNOSIS — I5023 Acute on chronic systolic (congestive) heart failure: Secondary | ICD-10-CM | POA: Diagnosis not present

## 2023-04-03 DIAGNOSIS — I482 Chronic atrial fibrillation, unspecified: Secondary | ICD-10-CM

## 2023-04-03 DIAGNOSIS — I5021 Acute systolic (congestive) heart failure: Secondary | ICD-10-CM | POA: Diagnosis not present

## 2023-04-03 DIAGNOSIS — E785 Hyperlipidemia, unspecified: Secondary | ICD-10-CM

## 2023-04-03 DIAGNOSIS — N179 Acute kidney failure, unspecified: Secondary | ICD-10-CM

## 2023-04-03 DIAGNOSIS — D649 Anemia, unspecified: Secondary | ICD-10-CM

## 2023-04-03 DIAGNOSIS — K922 Gastrointestinal hemorrhage, unspecified: Secondary | ICD-10-CM

## 2023-04-03 DIAGNOSIS — R195 Other fecal abnormalities: Secondary | ICD-10-CM | POA: Diagnosis not present

## 2023-04-03 LAB — VITAMIN B12: Vitamin B-12: 784 pg/mL (ref 180–914)

## 2023-04-03 MED ORDER — POTASSIUM CHLORIDE CRYS ER 20 MEQ PO TBCR
40.0000 meq | EXTENDED_RELEASE_TABLET | Freq: Once | ORAL | Status: AC
  Administered 2023-04-03: 40 meq via ORAL
  Filled 2023-04-03: qty 2

## 2023-04-03 MED ORDER — POLYETHYLENE GLYCOL 3350 17 G PO PACK
17.0000 g | PACK | Freq: Every day | ORAL | Status: DC
  Filled 2023-04-03 (×3): qty 1

## 2023-04-03 MED ORDER — MAGNESIUM SULFATE 2 GM/50ML IV SOLN
2.0000 g | Freq: Once | INTRAVENOUS | Status: AC
  Administered 2023-04-03: 2 g via INTRAVENOUS
  Filled 2023-04-03: qty 50

## 2023-04-03 NOTE — Progress Notes (Signed)
Mobility Specialist Progress Note:   04/03/23 1627  Mobility  Activity Ambulated with assistance in hallway  Level of Assistance Contact guard assist, steadying assist  Assistive Device Crutches  Distance Ambulated (ft) 180 ft  Activity Response Tolerated well  Mobility Referral Yes  $Mobility charge 1 Mobility  Mobility Specialist Start Time (ACUTE ONLY) 1615  Mobility Specialist Stop Time (ACUTE ONLY) 1627  Mobility Specialist Time Calculation (min) (ACUTE ONLY) 12 min   Pre Mobility: 96/54 BP During Mobility: 104-124 HR  Post Mobility: 91 HR   Pt received in chair, eager to mobility. Denied any feelings of pain or discomfort during ambulation, asymptomatic throughout. Pt returned to chair with call bell in reach and all needs met.   Leory Plowman  Mobility Specialist Please contact via [employer] office at [phone]

## 2023-04-03 NOTE — Progress Notes (Signed)
PROGRESS NOTE    Leonard Cisneros  FAO:[id number] DOB: 02-17-1941 DOA: 03/31/2023 PCP: [name], FNP    Chief Complaint  Patient presents with   Weakness    Brief Narrative:  82 year old man with increasing lethargy and dyspnea at home. Found to have hemoglobin of 6.2 in ED.  Transfusion started.  Hypotensive into the 80s requiring norepinephrine.  Transferred from [facility] to [facility] for ICU admission. Denies frank melena.  No vomiting. History of ischemic cardiomyopathy with triple-vessel disease.  Prior bypass 2003 . Medical management was recommended.  EF is normal.  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 Atrial fibrillation status post Watchman. History of chronic anemia with negative colonoscopy and EGD.  Ferritin has been normal in the past.  B12 and folate normal.     Assessment & Plan:   Principal Problem:   GIB (gastrointestinal bleeding) Active Problems:   History of duodenal ulcer   Anemia   AKI (acute kidney injury) (HCC)   Acute on chronic HFrEF (heart failure with reduced ejection fraction) (HCC)   Microcytic anemia   Demand ischemia   Atrial fibrillation (HCC)  #1 acute decompensated biventricular systolic heart failure -Patient noted to have required pressors of norepinephrine on admission with complaints of significant shortness of breath and noted to have a significant anemia. -Troponins obtained on admission elevated however seem to plateau at 2178>> 2438>>> 2427. -Patient status post transfusion of 2 units PRBC with hemoglobin currently stable at 8.6. -2D echo obtained during this hospitalization with a EF of 30 to 35%, left ventricular global hypokinesis, mild concentric LVH, severely dilated left atrial size, moderately dilated right atrial size, mild MVR, mild MS.  Mild aortic valve stenosis. -EF on recent 2D echo significantly reduced from prior EF of 55 to 60% from January 2024. -BP somewhat soft. -Currently on  IV Lasix with urine output of 1.025 L over the past 24 hours. -Patient being followed by cardiology who are recommending continuation of diuresis with Lasix and Lopressor and no need for pressors/ionotropes at this time. -Per cardiology.  2.  CAD/CABG/demand ischemia -Patient noted to have cath in 09/27/2022 with patent RIMA/LIMA.  Patient denies any chest pain and at this point in time cardiology does not feel patient is a candidate for catheterization recommending medical management at this time. -Per cardiology.  3.  Severe symptomatic anemia -Patient presented with severe symptomatic anemia with shortness of breath, noted to have a hemoglobin of 6.2 on presentation. -Status post transfusion 2 units PRBCs. -Hemoglobin currently stable at 8.6. -FOBT noted to be positive. -Patient with a macrocytic anemia. -Patient seen in consultation by GI and patient noted to have a recent EGD 02/15/2023 that showed mild gastritis and a clean-based 3 mm duodenal ulcer with biopsies negative for H. pylori. -Patient noted to have a colonoscopy 03/13/2023 which showed nonbleeding internal hemorrhoids and 2 small tubular adenomas which were removed. -Anemia panel not consistent with iron deficiency anemia, -Patient seen in consultation by hematology and bone marrow biopsy ordered currently pending and hopefully to be done tomorrow 04/04/2023. -Follow H&H. -Transfusion threshold hemoglobin < 8. -GI was following but have signed off as of 04/03/2023. -Per hematology/oncology.  4.  Permanent A-fib status post ablation -Currently rate controlled on Lopressor. -Not a anticoagulation candidate due to recurrent anemias and concern for prior history of GI bleed. -Per cardiology.  5.  Hyperlipidemia -Continue statin.  6.  Demand ischemia -Felt likely secondary to anemia in the setting of history of CAD status  post CABG to stop 3. -Troponins on admission noted at 2150 seem to have plateaued and peaked at  2438. -Patient denies any angina/chest pain. -Keep hemoglobin greater than 8. -Continue current cardiac regimen of statin, beta-blocker.  Aspirin on hold in the setting of possible GI bleed/anemia.  7.  Chronic venous stasis dermatitis -Continue current local wound care.  8.  AKI -Likely secondary to prerenal azotemia in the setting of decompensated CHF, presentation with hypotension. -Renal function improved with transfusion of PRBC and diuresis. -Urine output of 1.025 L over the past 24 hours. -Follow-up.   DVT prophylaxis: SCDs. Code Status: Full Family Communication: Updated patient and wife at bedside. Disposition: Likely home when clinically improved and cleared by cardiology.  Status is: Inpatient Remains inpatient appropriate because: Severity of illness   Consultants:  IR: Alwyn Ren, NP 04/03/2023 Oncology: Dr. Mosetta Putt 04/02/2023 Cardiology: Dr. Excell Seltzer 04/02/2023 Gastroenterology: Dr. Leonides Schanz 04/01/2023  Procedures:  Transfusion 2 units PRBC 03/31/2023 Chest x-ray 03/31/2023 2D echo 04/02/2023   Antimicrobials:  Anti-infectives (From admission, onward)    Start     Dose/Rate Route Frequency Ordered Stop   03/31/23 1000  cefTRIAXone (ROCEPHIN) 2 g in sodium chloride 0.9 % 100 mL IVPB  Status:  Discontinued        2 g 200 mL/hr over 30 Minutes Intravenous Every 24 hours 03/31/23 0949 03/31/23 1530   03/31/23 1000  azithromycin (ZITHROMAX) 500 mg in sodium chloride 0.9 % 250 mL IVPB  Status:  Discontinued        500 mg 250 mL/hr over 60 Minutes Intravenous Every 24 hours 03/31/23 0949 03/31/23 1530         Subjective: Sitting up in recliner.  Eating lunch.  Denies any chest pain or shortness of breath.  No abdominal pain.  States tiredness and fatigue have improved since admission.  Denies any lightheadedness or dizziness.  Objective: Vitals:   04/03/23 0500 04/03/23 0732 04/03/23 1059 04/03/23 1610  BP:  (!) 93/43 94/73 (!) 96/44  Pulse:  100 84   Resp:  20  15 18   Temp:  97.8 F (36.6 C) 97.8 F (36.6 C) 97.8 F (36.6 C)  TempSrc:  Oral Oral Oral  SpO2:  91% 93% 95%  Weight: 88.7 kg     Height:        Intake/Output Summary (Last 24 hours) at 04/03/2023 1908 Last data filed at 04/03/2023 0800 Gross per 24 hour  Intake 240 ml  Output 975 ml  Net -735 ml   Filed Weights   04/01/23 0500 04/02/23 0705 04/03/23 0500  Weight: 88.1 kg 90.1 kg 88.7 kg    Examination:  General exam: Appears calm and comfortable  Respiratory system: Decreased breath sounds in the bases.  Normal respiratory effort.  Speaking in full sentences.  No use of accessory muscles of respiration.   Cardiovascular system: S1 & S2 heard, RRR. No murmurs, rubs, gallops or clicks.  1-2+ bilateral lower extremity edema with chronic venous stasis changes. Gastrointestinal system: Abdomen is nondistended, soft and nontender. No organomegaly or masses felt. Normal bowel sounds heard. Central nervous system: Alert and oriented. No focal neurological deficits. Extremities: Right lower extremity in bandage.  1-2+ bilateral lower extremity edema with chronic venous stasis changes noted.  Symmetric 5 x 5 power. Skin: No rashes, lesions or ulcers Psychiatry: Judgement and insight appear normal. Mood & affect appropriate.     Data Reviewed: I have personally reviewed following labs and imaging studies  CBC: Recent Labs  Lab 03/31/23  1004 03/31/23 1416 03/31/23 2133 04/01/23 0207 04/02/23 0318 04/03/23 0330  WBC 11.9* 12.3* 12.9* 11.6* 7.6 7.0  NEUTROABS 9.2* 10.0*  --   --   --   --   HGB 6.2* 8.4* 8.4* 8.3* 8.7* 8.6*  HCT 19.3* 27.4* 25.2* 24.4* 26.2* 26.5*  MCV 116.3* 110.5* 104.6* 104.7* 106.9* 107.7*  PLT 191 199 182 180 172 205    Basic Metabolic Panel: Recent Labs  Lab 03/31/23 1004 03/31/23 2135 04/01/23 0207 04/02/23 0318 04/03/23 0330  NA 130* 135 136 135 133*  K 4.1 3.9 3.7 4.0 3.4*  CL 98 101 101 99 98  CO2 25 23 27 25 24   GLUCOSE 115* 108* 103*  99 91  BUN 38* 34* 32* 23 17  CREATININE 1.94* 1.74* 1.66* 1.33* 1.03  CALCIUM 7.8* 7.7* 7.7* 8.0* 7.9*  MG  --  1.8  --   --  1.7    GFR: Estimated Creatinine Clearance: 59.9 mL/min (by C-G formula based on SCr of 1.03 mg/dL).  Liver Function Tests: Recent Labs  Lab 03/31/23 1004  AST 67*  ALT 41  ALKPHOS 284*  BILITOT 3.5*  PROT 5.8*  ALBUMIN 2.5*    CBG: Recent Labs  Lab 03/31/23 1613  GLUCAP 94     Recent Results (from the past 240 hour(s))  Blood Culture (routine x 2)     Status: None (Preliminary result)   Collection Time: 03/31/23 10:04 AM   Specimen: Right Antecubital; Blood  Result Value Ref Range Status   Specimen Description   Final    RIGHT ANTECUBITAL BOTTLES DRAWN AEROBIC AND ANAEROBIC   Special Requests Blood Culture adequate volume  Final   Culture   Final    NO GROWTH 3 DAYS Performed at [facility], [address], [place], [place] [address]    Report Status PENDING  Incomplete  Blood Culture (routine x 2)     Status: None (Preliminary result)   Collection Time: 03/31/23 10:04 AM   Specimen: Left Antecubital; Blood  Result Value Ref Range Status   Specimen Description   Final    LEFT ANTECUBITAL BOTTLES DRAWN AEROBIC AND ANAEROBIC   Special Requests   Final    Blood Culture results may not be optimal due to an excessive volume of blood received in culture bottles   Culture   Final    NO GROWTH 3 DAYS Performed at [facility], [address] [place], [place] [address]    Report Status PENDING  Incomplete  Resp panel by RT-PCR (RSV, Flu A&B, Covid) Anterior Nasal Swab     Status: None   Collection Time: 03/31/23 10:35 AM   Specimen: Anterior Nasal Swab  Result Value Ref Range Status   SARS Coronavirus 2 by RT PCR NEGATIVE NEGATIVE Final    Comment: (NOTE) SARS-CoV-2 target nucleic acids are NOT DETECTED.  The SARS-CoV-2 RNA is generally detectable in upper respiratory specimens during the acute phase of infection. The  lowest concentration of SARS-CoV-2 viral copies this assay can detect is 138 copies/mL. A negative result does not preclude SARS-Cov-2 infection and should not be used as the sole basis for treatment or other patient management decisions. A negative result may occur with  improper specimen collection/handling, submission of specimen other than nasopharyngeal swab, presence of viral mutation(s) within the areas targeted by this assay, and inadequate number of viral copies(<138 copies/mL). A negative result must be combined with clinical observations, patient history, and epidemiological information. The expected result is Negative.  Fact Sheet for Patients:  BloggerCourse.com  Fact Sheet for Healthcare Providers:  SeriousBroker.it  This test is no t yet approved or cleared by the [place] FDA and  has been authorized for detection and/or diagnosis of SARS-CoV-2 by FDA under an Emergency Use Authorization (EUA). This EUA will remain  in effect (meaning this test can be used) for the duration of the COVID-19 declaration under Section 564(b)(1) of the Act, 21 U.S.C.section 360bbb-3(b)(1), unless the authorization is terminated  or revoked sooner.       Influenza A by PCR NEGATIVE NEGATIVE Final   Influenza B by PCR NEGATIVE NEGATIVE Final    Comment: (NOTE) The Xpert Xpress SARS-CoV-2/FLU/RSV plus assay is intended as an aid in the diagnosis of influenza from Nasopharyngeal swab specimens and should not be used as a sole basis for treatment. Nasal washings and aspirates are unacceptable for Xpert Xpress SARS-CoV-2/FLU/RSV testing.  Fact Sheet for Patients: BloggerCourse.com  Fact Sheet for Healthcare Providers: SeriousBroker.it  This test is not yet approved or cleared by the [place] FDA and has been authorized for detection and/or diagnosis of SARS-CoV-2 by FDA under  an Emergency Use Authorization (EUA). This EUA will remain in effect (meaning this test can be used) for the duration of the COVID-19 declaration under Section 564(b)(1) of the Act, 21 U.S.C. section 360bbb-3(b)(1), unless the authorization is terminated or revoked.     Resp Syncytial Virus by PCR NEGATIVE NEGATIVE Final    Comment: (NOTE) Fact Sheet for Patients: BloggerCourse.com  Fact Sheet for Healthcare Providers: SeriousBroker.it  This test is not yet approved or cleared by the [place] FDA and has been authorized for detection and/or diagnosis of SARS-CoV-2 by FDA under an Emergency Use Authorization (EUA). This EUA will remain in effect (meaning this test can be used) for the duration of the COVID-19 declaration under Section 564(b)(1) of the Act, 21 U.S.C. section 360bbb-3(b)(1), unless the authorization is terminated or revoked.  Performed at [facility], [address] [place], [place] [address]   MRSA Next Gen by PCR, Nasal     Status: None   Collection Time: 03/31/23  4:22 PM   Specimen: Nasal Mucosa; Nasal Swab  Result Value Ref Range Status   MRSA by PCR Next Gen NOT DETECTED NOT DETECTED Final    Comment: (NOTE) The GeneXpert MRSA Assay (FDA approved for NASAL specimens only), is one component of a comprehensive MRSA colonization surveillance program. It is not intended to diagnose MRSA infection nor to guide or monitor treatment for MRSA infections. Test performance is not FDA approved in patients less than 83 years old. Performed at [facility], [address] [place], [place] [address]          Radiology Studies: ECHOCARDIOGRAM COMPLETE  Result Date: 04/02/2023    ECHOCARDIOGRAM REPORT   Patient Name:   Leonard Cisneros Date of Exam: 04/02/2023 Medical Rec #:  [id number]        Height:       71.0 in Accession #:    [id number]       Weight:       198.6 lb Date of Birth:  1940/11/25        BSA:          2.102 m Patient Age:    81 years         BP:           110/74 mmHg Patient Gender: M  HR:           100 bpm. Exam Location:  Inpatient Procedure: 2D Echo, Cardiac Doppler and Color Doppler Indications:    CHF I50.21  History:        Patient has prior history of Echocardiogram examinations, most                 recent 09/25/2022. CAD, ETOH abuse, Arrythmias:Atrial                 Fibrillation; Risk Factors:Hypertension, Dyslipidemia and Former                 Smoker.  Sonographer:    Dondra Prader RVT RCS Referring Phys: [phone] WHITNEY D HARRIS IMPRESSIONS  1. Left ventricular ejection fraction, by estimation, is 30 to 35%. The left ventricle has moderately decreased function. The left ventricle demonstrates global hypokinesis. The left ventricular internal cavity size was mildly dilated. There is mild concentric left ventricular hypertrophy. Left ventricular diastolic parameters are indeterminate.  2. Right ventricular systolic function is mildly reduced. The right ventricular size is mildly enlarged. Tricuspid regurgitation signal is inadequate for assessing PA pressure.  3. Left atrial size was severely dilated.  4. Right atrial size was moderately dilated.  5. The mitral valve is normal in structure. Mild mitral valve regurgitation. Mild mitral stenosis. The mean mitral valve gradient is 6.0 mmHg. Moderate mitral annular calcification.  6. The aortic valve is tricuspid. There is moderate calcification of the aortic valve. Aortic valve regurgitation is not visualized. Mild aortic valve stenosis. Aortic valve mean gradient measures 14.0 mmHg.  7. The inferior vena cava is dilated in size with <50% respiratory variability, suggesting right atrial pressure of 15 mmHg. FINDINGS  Left Ventricle: Left ventricular ejection fraction, by estimation, is 30 to 35%. The left ventricle has moderately decreased function. The left ventricle demonstrates global hypokinesis. The left ventricular internal  cavity size was mildly dilated. There is mild concentric left ventricular hypertrophy. Left ventricular diastolic parameters are indeterminate. Right Ventricle: The right ventricular size is mildly enlarged. No increase in right ventricular wall thickness. Right ventricular systolic function is mildly reduced. Tricuspid regurgitation signal is inadequate for assessing PA pressure. Left Atrium: Left atrial size was severely dilated. Right Atrium: Right atrial size was moderately dilated. Pericardium: There is no evidence of pericardial effusion. Mitral Valve: The mitral valve is normal in structure. There is mild calcification of the mitral valve leaflet(s). Moderate mitral annular calcification. Mild mitral valve regurgitation. Mild mitral valve stenosis. MV peak gradient, 11.6 mmHg. The mean mitral valve gradient is 6.0 mmHg. Tricuspid Valve: The tricuspid valve is normal in structure. Tricuspid valve regurgitation is trivial. Aortic Valve: The aortic valve is tricuspid. There is moderate calcification of the aortic valve. Aortic valve regurgitation is not visualized. Mild aortic stenosis is present. Aortic valve mean gradient measures 14.0 mmHg. Aortic valve peak gradient measures 24.1 mmHg. Aortic valve area, by VTI measures 2.13 cm. Pulmonic Valve: The pulmonic valve was normal in structure. Pulmonic valve regurgitation is not visualized. Aorta: The aortic root is normal in size and structure. Venous: The inferior vena cava is dilated in size with less than 50% respiratory variability, suggesting right atrial pressure of 15 mmHg. IAS/Shunts: No atrial level shunt detected by color flow Doppler.  LEFT VENTRICLE PLAX 2D LVIDd:         6.10 cm LVIDs:         5.20 cm LV PW:         1.40  cm LV IVS:        1.30 cm LVOT diam:     2.20 cm LV SV:         86 LV SV Index:   41 LVOT Area:     3.80 cm  RIGHT VENTRICLE            IVC RV Basal diam:  4.50 cm    IVC diam: 3.20 cm RV Mid diam:    3.00 cm RV S prime:     8.51  cm/s TAPSE (M-mode): 0.8 cm LEFT ATRIUM              Index        RIGHT ATRIUM           Index LA diam:        5.90 cm  2.81 cm/m   RA Area:     27.20 cm LA Vol (A2C):   169.0 ml 80.38 ml/m  RA Volume:   96.85 ml  46.07 ml/m LA Vol (A4C):   140.0 ml 66.59 ml/m LA Biplane Vol: 169.0 ml 80.38 ml/m  AORTIC VALVE                     PULMONIC VALVE AV Area (Vmax):    2.32 cm      PV Vmax:       0.84 m/s AV Area (Vmean):   2.31 cm      PV Peak grad:  2.9 mmHg AV Area (VTI):     2.13 cm AV Vmax:           245.33 cm/s AV Vmean:          168.667 cm/s AV VTI:            0.401 m AV Peak Grad:      24.1 mmHg AV Mean Grad:      14.0 mmHg LVOT Vmax:         149.75 cm/s LVOT Vmean:        102.375 cm/s LVOT VTI:          0.225 m LVOT/AV VTI ratio: 0.56  AORTA Ao Root diam: 3.20 cm Ao Asc diam:  3.50 cm MITRAL VALVE MV Area (PHT): 4.94 cm     SHUNTS MV Area VTI:   3.09 cm     Systemic VTI:  0.22 m MV Peak grad:  11.6 mmHg    Systemic Diam: 2.20 cm MV Mean grad:  6.0 mmHg MV Vmax:       1.70 m/s MV Vmean:      110.0 cm/s MV Decel Time: 154 msec MR Peak grad: 88.4 mmHg MR Mean grad: 61.0 mmHg MR Vmax:      470.00 cm/s MR Vmean:     368.0 cm/s MV E velocity: 152.00 cm/s MV A velocity: 37.70 cm/s MV E/A ratio:  4.03 Dalton McleanMD Electronically signed by Wilfred Lacy Signature Date/Time: 04/02/2023/4:37:17 PM    Final         Scheduled Meds:  atorvastatin  80 mg Oral Daily   Chlorhexidine Gluconate Cloth  6 each Topical Daily   feeding supplement  1 Container Oral TID BM   furosemide  40 mg Intravenous BID   metoprolol tartrate  12.5 mg Oral BID   pantoprazole  40 mg Oral BID   polyethylene glycol  17 g Oral Daily   Continuous Infusions:  sodium chloride       LOS: 3 days    Time spent:  40 minutes    Ramiro Harvest, MD [facility]   To contact the attending provider between 7A-7P or the covering provider during after hours 7P-7A, please log into the web site [email or web address] and access  using universal [place] password for that web site. If you do not have the password, please call the hospital operator.  04/03/2023, 7:08 PM

## 2023-04-03 NOTE — Progress Notes (Addendum)
Gastroenterology Inpatient Follow Up    Subjective: Patient states that he has not had a stool since yesterday. Denies abdominal pain. He states that he has been eating and drinking well without issues.  Objective: Vital signs in last 24 hours: Temp:  [97.5 F (36.4 C)-97.8 F (36.6 C)] 97.8 F (36.6 C) (07/31 0732) Pulse Rate:  [93-112] 100 (07/31 0732) Resp:  [20-27] 20 (07/31 0732) BP: (91-114)/(43-98) 93/43 (07/31 0732) SpO2:  [77 %-96 %] 91 % (07/31 0732) Weight:  [88.7 kg] 88.7 kg (07/31 0500) Last BM Date : 04/02/23  Intake/Output from previous day: 07/30 0701 - 07/31 0700 In: -  Out: 1025 [Urine:1025] Intake/Output this shift: Total I/O In: 240 [P.O.:240] Out: 250 [Urine:250]  General appearance: alert and cooperative Resp: no increased WOB Cardio: tachycardic GI: non-tender, tympanic  Lab Results: Recent Labs    04/01/23 0207 04/02/23 0318 04/03/23 0330  WBC 11.6* 7.6 7.0  HGB 8.3* 8.7* 8.6*  HCT 24.4* 26.2* 26.5*  PLT 180 172 205   BMET Recent Labs    04/01/23 0207 04/02/23 0318 04/03/23 0330  NA 136 135 133*  K 3.7 4.0 3.4*  CL 101 99 98  CO2 27 25 24   GLUCOSE 103* 99 91  BUN 32* 23 17  CREATININE 1.66* 1.33* 1.03  CALCIUM 7.7* 8.0* 7.9*   LFT Recent Labs    03/31/23 1004  PROT 5.8*  ALBUMIN 2.5*  AST 67*  ALT 41  ALKPHOS 284*  BILITOT 3.5*   PT/INR Recent Labs    03/31/23 1004  LABPROT 16.7*  INR 1.3*   Hepatitis Panel No results for input(s): "HEPBSAG", "HCVAB", "HEPAIGM", "HEPBIGM" in the last 72 hours. C-Diff No results for input(s): "CDIFFTOX" in the last 72 hours.  Studies/Results: ECHOCARDIOGRAM COMPLETE  Result Date: 04/02/2023    ECHOCARDIOGRAM REPORT   Patient Name:   Leonard Cisneros Date of Exam: 04/02/2023 Medical Rec #:  [id number]        Height:       71.0 in Accession #:    [id number]       Weight:       198.6 lb Date of Birth:  1940/12/17       BSA:          2.102 m Patient Age:    82 years          BP:           110/74 mmHg Patient Gender: M                HR:           100 bpm. Exam Location:  Inpatient Procedure: 2D Echo, Cardiac Doppler and Color Doppler Indications:    CHF I50.21  History:        Patient has prior history of Echocardiogram examinations, most                 recent 09/25/2022. CAD, ETOH abuse, Arrythmias:Atrial                 Fibrillation; Risk Factors:Hypertension, Dyslipidemia and Former                 Smoker.  Sonographer:    Dondra Prader RVT RCS Referring Phys: [phone] WHITNEY D HARRIS IMPRESSIONS  1. Left ventricular ejection fraction, by estimation, is 30 to 35%. The left ventricle has moderately decreased function. The left ventricle demonstrates global hypokinesis. The left ventricular internal cavity size was mildly dilated. There is  mild concentric left ventricular hypertrophy. Left ventricular diastolic parameters are indeterminate.  2. Right ventricular systolic function is mildly reduced. The right ventricular size is mildly enlarged. Tricuspid regurgitation signal is inadequate for assessing PA pressure.  3. Left atrial size was severely dilated.  4. Right atrial size was moderately dilated.  5. The mitral valve is normal in structure. Mild mitral valve regurgitation. Mild mitral stenosis. The mean mitral valve gradient is 6.0 mmHg. Moderate mitral annular calcification.  6. The aortic valve is tricuspid. There is moderate calcification of the aortic valve. Aortic valve regurgitation is not visualized. Mild aortic valve stenosis. Aortic valve mean gradient measures 14.0 mmHg.  7. The inferior vena cava is dilated in size with <50% respiratory variability, suggesting right atrial pressure of 15 mmHg. FINDINGS  Left Ventricle: Left ventricular ejection fraction, by estimation, is 30 to 35%. The left ventricle has moderately decreased function. The left ventricle demonstrates global hypokinesis. The left ventricular internal cavity size was mildly dilated. There is mild concentric  left ventricular hypertrophy. Left ventricular diastolic parameters are indeterminate. Right Ventricle: The right ventricular size is mildly enlarged. No increase in right ventricular wall thickness. Right ventricular systolic function is mildly reduced. Tricuspid regurgitation signal is inadequate for assessing PA pressure. Left Atrium: Left atrial size was severely dilated. Right Atrium: Right atrial size was moderately dilated. Pericardium: There is no evidence of pericardial effusion. Mitral Valve: The mitral valve is normal in structure. There is mild calcification of the mitral valve leaflet(s). Moderate mitral annular calcification. Mild mitral valve regurgitation. Mild mitral valve stenosis. MV peak gradient, 11.6 mmHg. The mean mitral valve gradient is 6.0 mmHg. Tricuspid Valve: The tricuspid valve is normal in structure. Tricuspid valve regurgitation is trivial. Aortic Valve: The aortic valve is tricuspid. There is moderate calcification of the aortic valve. Aortic valve regurgitation is not visualized. Mild aortic stenosis is present. Aortic valve mean gradient measures 14.0 mmHg. Aortic valve peak gradient measures 24.1 mmHg. Aortic valve area, by VTI measures 2.13 cm. Pulmonic Valve: The pulmonic valve was normal in structure. Pulmonic valve regurgitation is not visualized. Aorta: The aortic root is normal in size and structure. Venous: The inferior vena cava is dilated in size with less than 50% respiratory variability, suggesting right atrial pressure of 15 mmHg. IAS/Shunts: No atrial level shunt detected by color flow Doppler.  LEFT VENTRICLE PLAX 2D LVIDd:         6.10 cm LVIDs:         5.20 cm LV PW:         1.40 cm LV IVS:        1.30 cm LVOT diam:     2.20 cm LV SV:         86 LV SV Index:   41 LVOT Area:     3.80 cm  RIGHT VENTRICLE            IVC RV Basal diam:  4.50 cm    IVC diam: 3.20 cm RV Mid diam:    3.00 cm RV S prime:     8.51 cm/s TAPSE (M-mode): 0.8 cm LEFT ATRIUM              Index         RIGHT ATRIUM           Index LA diam:        5.90 cm  2.81 cm/m   RA Area:     27.20 cm LA Vol (A2C):   169.0  ml 80.38 ml/m  RA Volume:   96.85 ml  46.07 ml/m LA Vol (A4C):   140.0 ml 66.59 ml/m LA Biplane Vol: 169.0 ml 80.38 ml/m  AORTIC VALVE                     PULMONIC VALVE AV Area (Vmax):    2.32 cm      PV Vmax:       0.84 m/s AV Area (Vmean):   2.31 cm      PV Peak grad:  2.9 mmHg AV Area (VTI):     2.13 cm AV Vmax:           245.33 cm/s AV Vmean:          168.667 cm/s AV VTI:            0.401 m AV Peak Grad:      24.1 mmHg AV Mean Grad:      14.0 mmHg LVOT Vmax:         149.75 cm/s LVOT Vmean:        102.375 cm/s LVOT VTI:          0.225 m LVOT/AV VTI ratio: 0.56  AORTA Ao Root diam: 3.20 cm Ao Asc diam:  3.50 cm MITRAL VALVE MV Area (PHT): 4.94 cm     SHUNTS MV Area VTI:   3.09 cm     Systemic VTI:  0.22 m MV Peak grad:  11.6 mmHg    Systemic Diam: 2.20 cm MV Mean grad:  6.0 mmHg MV Vmax:       1.70 m/s MV Vmean:      110.0 cm/s MV Decel Time: 154 msec MR Peak grad: 88.4 mmHg MR Mean grad: 61.0 mmHg MR Vmax:      470.00 cm/s MR Vmean:     368.0 cm/s MV E velocity: 152.00 cm/s MV A velocity: 37.70 cm/s MV E/A ratio:  4.03 Dalton McleanMD Electronically signed by Wilfred Lacy Signature Date/Time: 04/02/2023/4:37:17 PM    Final     Medications: I have reviewed the patient's current medications. Scheduled:  atorvastatin  80 mg Oral Daily   Chlorhexidine Gluconate Cloth  6 each Topical Daily   feeding supplement  1 Container Oral TID BM   furosemide  40 mg Intravenous BID   metoprolol tartrate  12.5 mg Oral BID   pantoprazole  40 mg Oral BID   Continuous:  sodium chloride     magnesium sulfate bolus IVPB 2 g (04/03/23 0927)   RUE:[id number] sodium, melatonin, mouth rinse, polyethylene glycol  Assessment/Plan: 82 year old male with history of PUD, atrial fibrillation (not on anticoagulation), CAD s/p CABG, HFpEF, and moderate-to-severe aortic stenosis presented with weakness  and hypotension, found to have a heart failure exacerbation, demand ischemia, and anemia. He was found to be FOBT positive. Hemoglobin was 6.2 (decreased from 7.7 on 03/15/23) upon arrival to the [facility] ED. He received 2 units of PRBCs and hemoglobin has subsequently increased to 8.4.  His hemoglobin has remained stable since that blood transfusion.  Patient states that his stools are dark, but this may be due to chronic oral iron supplements. He recently had an EGD 02/15/23 that showed mild gastritis and a clean-based 3 mm duodenal ulcer with biopsies that were negative for H. pylori. Colonoscopy 03/13/23 showed nonbleeding internal hemorrhoids and 2 small tubular adenomas that were removed. Iron studies are not suggestive of iron deficiency anemia.  Reticulocyte index suggest hypoproliferation.  Hematology saw the patient yesterday and  does not think that his anemia is due to GI blood loss.  They are planning for bone marrow biopsy for further evaluation.  Patient does seem somewhat tympanic on abdominal exam so we will start some MiraLAX for constipation prevention - Will start some Miralax every day to prevent constipation - GI will sign off for now.  Please call back if any new questions arise.   LOS: 3 days   Imogene Burn 04/03/2023, 9:54 AM

## 2023-04-03 NOTE — Plan of Care (Signed)

## 2023-04-03 NOTE — Progress Notes (Signed)
Cardiologist:  [name]  Subjective:  Denies SSCP, palpitations or Dyspnea   Objective:  Vitals:   04/02/23 2336 04/03/23 0300 04/03/23 0500 04/03/23 0732  BP: 92/77 (!) 114/98  (!) 93/43  Pulse: 93 (!) 103  100  Resp: 20 (!) 24  20  Temp: (!) 97.5 F (36.4 C) 97.7 F (36.5 C)  97.8 F (36.6 C)  TempSrc: Oral Oral  Oral  SpO2: 95% 91%  91%  Weight:   88.7 kg   Height:        Intake/Output from previous day:  Intake/Output Summary (Last 24 hours) at 04/03/2023 0847 Last data filed at 04/03/2023 0800 Gross per 24 hour  Intake 240 ml  Output 975 ml  Net -735 ml    Physical Exam:  Elderly male sitting in chair Exp wheezing/rhonchi SEM JVP elevated with V wave Abdomen benign Plus 2 edema with venous stasis changes   Lab Results: Basic Metabolic Panel: Recent Labs    03/31/23 2135 04/01/23 0207 04/02/23 0318 04/03/23 0330  NA 135   < > 135 133*  K 3.9   < > 4.0 3.4*  CL 101   < > 99 98  CO2 23   < > 25 24  GLUCOSE 108*   < > 99 91  BUN 34*   < > 23 17  CREATININE 1.74*   < > 1.33* 1.03  CALCIUM 7.7*   < > 8.0* 7.9*  MG 1.8  --   --  1.7   < > = values in this interval not displayed.   Liver Function Tests: Recent Labs    03/31/23 1004  AST 67*  ALT 41  ALKPHOS 284*  BILITOT 3.5*  PROT 5.8*  ALBUMIN 2.5*   No results for input(s): "LIPASE", "AMYLASE" in the last 72 hours. CBC: Recent Labs    03/31/23 1004 03/31/23 1416 03/31/23 2133 04/02/23 0318 04/03/23 0330  WBC 11.9* 12.3*   < > 7.6 7.0  NEUTROABS 9.2* 10.0*  --   --   --   HGB 6.2* 8.4*   < > 8.7* 8.6*  HCT 19.3* 27.4*   < > 26.2* 26.5*  MCV 116.3* 110.5*   < > 106.9* 107.7*  PLT 191 199   < > 172 205   < > = values in this interval not displayed.    Anemia Panel: Recent Labs    03/31/23 2133 04/02/23 0318 04/03/23 0330  VITAMINB12  --   --  784  FERRITIN 483*  --   --   RETICCTPCT  --  2.4  --     Imaging: ECHOCARDIOGRAM COMPLETE  Result Date:  04/02/2023    ECHOCARDIOGRAM REPORT   Patient Name:   Leonard Cisneros Date of Exam: 04/02/2023 Medical Rec #:  [id number]        Height:       71.0 in Accession #:    [id number]       Weight:       198.6 lb Date of Birth:  [birth year]       BSA:          2.102 m Patient Age:    81 years         BP:           110/74 mmHg Patient Gender: M                HR:           100  bpm. Exam Location:  Inpatient Procedure: 2D Echo, Cardiac Doppler and Color Doppler Indications:    CHF I50.21  History:        Patient has prior history of Echocardiogram examinations, most                 recent 09/25/2022. CAD, ETOH abuse, Arrythmias:Atrial                 Fibrillation; Risk Factors:Hypertension, Dyslipidemia and Former                 Smoker.  Sonographer:    Dondra Prader RVT RCS Referring Phys: [phone] WHITNEY D HARRIS IMPRESSIONS  1. Left ventricular ejection fraction, by estimation, is 30 to 35%. The left ventricle has moderately decreased function. The left ventricle demonstrates global hypokinesis. The left ventricular internal cavity size was mildly dilated. There is mild concentric left ventricular hypertrophy. Left ventricular diastolic parameters are indeterminate.  2. Right ventricular systolic function is mildly reduced. The right ventricular size is mildly enlarged. Tricuspid regurgitation signal is inadequate for assessing PA pressure.  3. Left atrial size was severely dilated.  4. Right atrial size was moderately dilated.  5. The mitral valve is normal in structure. Mild mitral valve regurgitation. Mild mitral stenosis. The mean mitral valve gradient is 6.0 mmHg. Moderate mitral annular calcification.  6. The aortic valve is tricuspid. There is moderate calcification of the aortic valve. Aortic valve regurgitation is not visualized. Mild aortic valve stenosis. Aortic valve mean gradient measures 14.0 mmHg.  7. The inferior vena cava is dilated in size with <50% respiratory variability, suggesting right atrial pressure  of 15 mmHg. FINDINGS  Left Ventricle: Left ventricular ejection fraction, by estimation, is 30 to 35%. The left ventricle has moderately decreased function. The left ventricle demonstrates global hypokinesis. The left ventricular internal cavity size was mildly dilated. There is mild concentric left ventricular hypertrophy. Left ventricular diastolic parameters are indeterminate. Right Ventricle: The right ventricular size is mildly enlarged. No increase in right ventricular wall thickness. Right ventricular systolic function is mildly reduced. Tricuspid regurgitation signal is inadequate for assessing PA pressure. Left Atrium: Left atrial size was severely dilated. Right Atrium: Right atrial size was moderately dilated. Pericardium: There is no evidence of pericardial effusion. Mitral Valve: The mitral valve is normal in structure. There is mild calcification of the mitral valve leaflet(s). Moderate mitral annular calcification. Mild mitral valve regurgitation. Mild mitral valve stenosis. MV peak gradient, 11.6 mmHg. The mean mitral valve gradient is 6.0 mmHg. Tricuspid Valve: The tricuspid valve is normal in structure. Tricuspid valve regurgitation is trivial. Aortic Valve: The aortic valve is tricuspid. There is moderate calcification of the aortic valve. Aortic valve regurgitation is not visualized. Mild aortic stenosis is present. Aortic valve mean gradient measures 14.0 mmHg. Aortic valve peak gradient measures 24.1 mmHg. Aortic valve area, by VTI measures 2.13 cm. Pulmonic Valve: The pulmonic valve was normal in structure. Pulmonic valve regurgitation is not visualized. Aorta: The aortic root is normal in size and structure. Venous: The inferior vena cava is dilated in size with less than 50% respiratory variability, suggesting right atrial pressure of 15 mmHg. IAS/Shunts: No atrial level shunt detected by color flow Doppler.  LEFT VENTRICLE PLAX 2D LVIDd:         6.10 cm LVIDs:         5.20 cm LV PW:          1.40 cm LV IVS:        1.30  cm LVOT diam:     2.20 cm LV SV:         86 LV SV Index:   41 LVOT Area:     3.80 cm  RIGHT VENTRICLE            IVC RV Basal diam:  4.50 cm    IVC diam: 3.20 cm RV Mid diam:    3.00 cm RV S prime:     8.51 cm/s TAPSE (M-mode): 0.8 cm LEFT ATRIUM              Index        RIGHT ATRIUM           Index LA diam:        5.90 cm  2.81 cm/m   RA Area:     27.20 cm LA Vol (A2C):   169.0 ml 80.38 ml/m  RA Volume:   96.85 ml  46.07 ml/m LA Vol (A4C):   140.0 ml 66.59 ml/m LA Biplane Vol: 169.0 ml 80.38 ml/m  AORTIC VALVE                     PULMONIC VALVE AV Area (Vmax):    2.32 cm      PV Vmax:       0.84 m/s AV Area (Vmean):   2.31 cm      PV Peak grad:  2.9 mmHg AV Area (VTI):     2.13 cm AV Vmax:           245.33 cm/s AV Vmean:          168.667 cm/s AV VTI:            0.401 m AV Peak Grad:      24.1 mmHg AV Mean Grad:      14.0 mmHg LVOT Vmax:         149.75 cm/s LVOT Vmean:        102.375 cm/s LVOT VTI:          0.225 m LVOT/AV VTI ratio: 0.56  AORTA Ao Root diam: 3.20 cm Ao Asc diam:  3.50 cm MITRAL VALVE MV Area (PHT): 4.94 cm     SHUNTS MV Area VTI:   3.09 cm     Systemic VTI:  0.22 m MV Peak grad:  11.6 mmHg    Systemic Diam: 2.20 cm MV Mean grad:  6.0 mmHg MV Vmax:       1.70 m/s MV Vmean:      110.0 cm/s MV Decel Time: 154 msec MR Peak grad: 88.4 mmHg MR Mean grad: 61.0 mmHg MR Vmax:      470.00 cm/s MR Vmean:     368.0 cm/s MV E velocity: 152.00 cm/s MV A velocity: 37.70 cm/s MV E/A ratio:  4.03 Dalton McleanMD Electronically signed by Wilfred Lacy Signature Date/Time: 04/02/2023/4:37:17 PM    Final     Cardiac Studies:  ECG: afib   Telemetry:  Afib rates 80's   Echo: EF 30-35% mild MR/AS   Medications:    atorvastatin  80 mg Oral Daily   Chlorhexidine Gluconate Cloth  6 each Topical Daily   feeding supplement  1 Container Oral TID BM   furosemide  40 mg Intravenous BID   metoprolol tartrate  12.5 mg Oral BID   pantoprazole  40 mg Oral BID   potassium  chloride  40 mEq Oral Once      sodium chloride     magnesium sulfate bolus  IVPB      Assessment/Plan:   CHF:  EF normal prior to cath in January No chest pain or evidence of acute ischemic event. Continue lasix and lopressor Cr improved this am No needing pressors/inotropes at this point  CAD/CABG:  cath 09/27/22 with patent RIMA/LIMA Agree with Dr Excell Seltzer with negative troponin and no chest pain doubt acute arterial graft failure as cause of new LV dysfunction Not a candidate for cath Anemia:  severe improved to 26.5 Tarry stools per GI/primary team  Afib:  chronic rate fine Has had CTA to assess for Watchman can f/u as outpatient for this with Dr Lalla Brothers Not a candidate for anticoagulation with severe anemia and likely GI bleeding   Charlton Haws 04/03/2023, 8:47 AM

## 2023-04-03 NOTE — Progress Notes (Signed)
   Heart Failure Stewardship Pharmacist Progress Note   PCP: [name], FNP PCP-Cardiologist: [name], MD    HPI:  82 yo M with PMH of CHF, aflutter, CAD s/p CABG x3 (2003) and subsequent PCI, HTN, HLD, moderate aortic stenosis, permanent AF on apixaban.   Admitted 09/2022 with acute on chronic HFpEF after failed outpatient management with increasing torsemide. BNP 288. CXR with pulmonary edema. EF 55-60% on ECHO. Started on IV lasix and GDMT optimized. R/LHC with 3 vessel CAD and volume overload recommending medical management. He was started on Farxiga and approved for patient assistance from [name]. He was discharged and had follow up with HF TOC clinic. ReDS 38% and some volume overload. Continued on torsemide 80 mg daily and instructed to take additional 20 mg PRN for weight gain.   Has had a few hospitalizations since for acute on chronic HF and GI bleeds / anemia.   Presented back to the ED on 7/28 with generalized weakness, shortness of breath, LE edema, and weeping leg. Transferred [facility] to [facility] with GI bleed, hgb 6.2, FOBT +. Initially hypotensive requiring norepinephrine. GI and hematology consulted. Transfused with 2 units PRBC and hgb has been stable. GI signed off. Hematology ordering bone marrow biopsy.   ECHO 7/30 showed LVEF 30-35%, global hypokinesis, mild concentric LVH, RV mildly reduced, mild MR, mild AS.   Current HF Medications: Diuretic: furosemide 40 mg IV BID Beta Blocker: metoprolol tartrate 12.5 mg BID  Prior to admission HF Medications: Diuretic: torsemide 40 mg BID Beta blocker: metoprolol tartrate 25 mg BID *was taking Farxiga 10 mg daily  Pertinent Lab Values: Serum creatinine 1.03, BUN 17, Potassium 3.4, Sodium 133, BNP 1121, Magnesium 1.7  Vital Signs: Weight: 195 lbs (admission weight: 189 lbs) Blood pressure: 90/70s  Heart rate: 80-90s  I/O: net -0.8L yesterday; net -1L since admission  Medication Assistance / Insurance Benefits  Check: Does the patient have prescription insurance?  Yes Type of insurance plan: [name]  Does the patient qualify for medication assistance through manufacturers or grants?   Yes Eligible grants and/or patient assistance programs: Farxiga Medication assistance applications in progress: none  Medication assistance applications approved: Farxiga Approved medication assistance renewals will be completed by: [facility]  Outpatient Pharmacy:  Prior to admission outpatient pharmacy: [name] Is the patient willing to use [facility] TOC pharmacy at discharge? Yes Is the patient willing to transition their outpatient pharmacy to utilize a [facility] outpatient pharmacy?   Pending    Assessment: 1. Acute on chronic systolic CHF (LVEF 30-35%). NYHA class III symptoms. - Continue furosemide 40 mg IV BID. Strict I/Os and daily weights. Keep K>4 and Mg>2. - Continue metoprolol tartrate 12.5 mg BID - BP soft - too low for ARB/MRA at this time - Consider restarting Farxiga 10 mg daily. Prescription was last written for 30 days so this med was removed from his list on day 31 by [employer]. He has been approved for patient assistance and receives this in the mail from [name].    Plan: 1) Medication changes recommended at this time: - Restart Farxiga 10 mg daily  2) Patient assistance: - Approved for Farxiga patient assistance - last delivery for 90 day supply sent on 01/25/23  3)  Education  - To be completed prior to discharge  [name], PharmD, BCPS Heart Failure Stewardship Pharmacist Phone [phone]

## 2023-04-03 NOTE — Consult Note (Signed)
Chief Complaint: Patient was seen in consultation today for  Chief Complaint  Patient presents with   Weakness   Referring Physician(s): Dr. Mosetta Putt  Supervising Physician: Simonne Come  Patient Status: [facility] - In-pt  History of Present Illness: Leonard Cisneros is an 82 y.o. male with a medical history significant for HTN, ischemic cardiomyopathy with prior bypass in 2003, atrial flutter s/p remote ablation, and atrial fibrillation s/p Watchman and not on anticoagulation. He has a history of chronic anemia with a recent colonoscopy and EGD negative for bleeding. Patient presented to the [facility] ED 03/31/23 with lethargy and dyspnea. He was found to have a hemoglobin of 6.2 along with hypotension in the 80's. PRBCs administered and patient started on norepinephrine infusion. He was transferred to [facility] for further treatment of heart failure exacerbation, demand ischemia and anemia. FOBT was positive.   Cardiology, GI and Hematology/Oncology consults performed. Additional work up points towards a possible bone marrow disease rather than a GI bleed. Interventional Radiology has been asked to evaluate this patient for an image-guided bone marrow biopsy with aspiration for further work up.   Past Medical History:  Diagnosis Date   Aortic stenosis    Atrial flutter (HCC)    a. s/p remote ablation by Dr. Ladona Ridgel.   CAD (coronary artery disease)    a. s/p CABG 2003. b. 01/2016: unstable angina - s/p BMS to SVG-ramus intermediate, patent LIMA-mLAD, patent RIMA-dRCA.    CHF (congestive heart failure) (HCC)    Chronic lower back pain    Essential hypertension    Hyperlipidemia    Ischemic cardiomyopathy    a. Cath 01/2016 -  EF 50-55% with mild distal inferior hypocontractility.   Peripheral neuropathy 04/04/2014   Permanent atrial fibrillation [facility])     Past Surgical History:  Procedure Laterality Date   BACK SURGERY     BIOPSY  11/21/2022   Procedure: BIOPSY;  Surgeon:  Napoleon Form, MD;  Location: [facility] ENDOSCOPY;  Service: Gastroenterology;;   BIOPSY  02/15/2023   Procedure: BIOPSY;  Surgeon: Lanelle Bal, DO;  Location: [facility] ENDO SUITE;  Service: Endoscopy;;   CARDIAC CATHETERIZATION  ~ 2000; 2003   CARDIAC CATHETERIZATION N/A 02/01/2016   Procedure: Left Heart Cath and Cors/Grafts Angiography;  Surgeon: Lennette Bihari, MD;  Location: [facility] INVASIVE CV LAB;  Service: Cardiovascular;  Laterality: N/A;   CARDIAC CATHETERIZATION N/A 02/01/2016   Procedure: Coronary Stent Intervention;  Surgeon: Lennette Bihari, MD;  Location: [facility] INVASIVE CV LAB;  Service: Cardiovascular;  Laterality: N/A;   CATARACT EXTRACTION W/PHACO Left 02/09/2019   Procedure: CATARACT EXTRACTION PHACO AND INTRAOCULAR LENS PLACEMENT (IOC);  Surgeon: Fabio Pierce, MD;  Location: [facility] ORS;  Service: Ophthalmology;  Laterality: Left;  CDE: 27.70   CATARACT EXTRACTION W/PHACO Right 02/23/2019   Procedure: CATARACT EXTRACTION PHACO AND INTRAOCULAR LENS PLACEMENT RIGHT EYE;  Surgeon: Fabio Pierce, MD;  Location: [facility] ORS;  Service: Ophthalmology;  Laterality: Right;  CDE: 10.15   COLONOSCOPY  06/04/2002   Normal colon and rectum   COLONOSCOPY  07/08/2012   Procedure: COLONOSCOPY;  Surgeon: Corbin Ade, MD;  Location: [facility] ENDO SUITE;  Service: Endoscopy;  Laterality: N/A;  11:30   COLONOSCOPY N/A 10/02/2017   Procedure: COLONOSCOPY;  Surgeon: Corbin Ade, MD;  Location: [facility] ENDO SUITE;  Service: Endoscopy;  Laterality: N/A;  10:30am   COLONOSCOPY WITH PROPOFOL N/A 03/13/2023   Procedure: COLONOSCOPY WITH PROPOFOL;  Surgeon: Lanelle Bal, DO;  Location: [facility]  ENDO SUITE;  Service: Endoscopy;  Laterality: N/A;  1215pm, asa 3   CORONARY ANGIOPLASTY WITH STENT PLACEMENT  02/01/2016   CORONARY ARTERY BYPASS GRAFT  2003   LIMA to LAD,LIMA to distal RCA,SVG to ramus intermediate vessel.   ESOPHAGOGASTRODUODENOSCOPY (EGD) WITH PROPOFOL N/A 11/21/2022   Procedure: ESOPHAGOGASTRODUODENOSCOPY (EGD) WITH  PROPOFOL;  Surgeon: Napoleon Form, MD;  Location: [facility] ENDOSCOPY;  Service: Gastroenterology;  Laterality: N/A;   ESOPHAGOGASTRODUODENOSCOPY (EGD) WITH PROPOFOL N/A 12/27/2022   Procedure: ESOPHAGOGASTRODUODENOSCOPY (EGD) WITH PROPOFOL;  Surgeon: Lanelle Bal, DO;  Location: [facility] END[facility]ITE;  Service: Endoscopy;  Laterality: N/A;   ESOPHAGOGASTRODUODENOSCOPY (EGD) WITH PROPOFOL N/A 02/15/2023   Procedure: ESOPHAGOGASTRODUODENOSCOPY (EGD) WITH PROPOFOL;  Surgeon: Lanelle Bal, DO;  Location: [facility] END[facility];  Service: Endoscopy;  Laterality: N/A;   FEMORAL REVISION Right 03/30/2019   Procedure: Femoral revision right total hip arthroplasty;  Surgeon: Ollen Gross, MD;  Location: [facility] ORS;  Service: Orthopedics;  Laterality: Right;    FOREIGN BODY REMOVAL Right 04/02/2014   Procedure: FOREIGN BODY REMOVAL RIGHT FOOT;  Surgeon: Dalia Heading, MD;  Location: [facility] ORS;  Service: General;  Laterality: Right;   JOINT REPLACEMENT     JOINT REPLACEMENT     LUMBAR DISC SURGERY     POLYPECTOMY  03/13/2023   Procedure: POLYPECTOMY;  Surgeon: Lanelle Bal, DO;  Location: [facility] END[facility];  Service: Endoscopy;;   REVISION TOTAL HIP ARTHROPLASTY Bilateral 2005-2012   right-left   RIGHT/LEFT HEART CATH AND CORONARY/GRAFT ANGIOGRAPHY N/A 09/27/2022   Procedure: RIGHT/LEFT HEART CATH AND CORONARY/GRAFT ANGIOGRAPHY;  Surgeon: [place]az[place], Peter M, MD;  Location: [facility] INVASIVE CV LAB;  Service: Cardiovascular;  Laterality: N/A;   SHOULDER OPEN ROTATOR CUFF REPAIR Right    TOTAL HIP ARTHROPLASTY Right 1999   TOTAL HIP ARTHROPLASTY Left 2008   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    Allergies: Augmentin [amoxicillin-pot clavulanate]  Medications: Prior to Admission medications   Medication Sig Start Date End Date  Taking? Authorizing Provider  acetaminophen (TYLENOL) 325 MG tablet Take 2 tablets (650 mg total) by mouth every 6 (six) hours as needed for mild pain (or Fever >/= 101). 02/16/23  Yes Emokpae, Courage, MD  atorvastatin (LIPITOR) 80 MG tablet TAKE 1 TABLET BY MOUTH EVERY DAY Patient taking differently: Take 80 mg by mouth daily. 11/30/22  Yes [name], MD  Iron, Ferrous Sulfate, 325 (65 Fe) MG TABS Take 325 mg by mouth daily. 02/16/23  Yes Emokpae, Courage, MD  metoprolol tartrate (LOPRESSOR) 25 MG tablet Take 1 tablet (25 mg total) by mouth 2 (two) times daily. 03/14/23 06/12/23 Yes [name], NP  pantoprazole (PROTONIX) 40 MG tablet Take 1 tablet (40 mg total) by mouth 2 (two) times daily. 02/25/23 05/26/23 Yes Mahon, Frederik Schmidt, NP  Potassium Chloride ER 20 MEQ TBCR Take 1 tablet (20 mEq total) by mouth daily. 1 tab daily by mouth 02/16/23  Yes Emokpae, Courage, MD  silver sulfADIAZINE (SILVADENE) 1 % cream Apply 1 Application topically daily. 02/12/23  Yes [name], FNP  torsemide (DEMADEX) 20 MG tablet Take 2 tablets (40 mg total) by mouth 2 (two) times daily. 02/16/23 02/11/24 Yes Emokpae, Courage, MD  nitroGLYCERIN (NITROSTAT) 0.4 MG SL tablet Place 1 tablet (0.4 mg total) under the tongue every 5 (five) minutes x 3 doses as needed  for chest pain (if no relief after 3rd dose, proceed to the ED for an evaluation or call 911). 09/05/22   [name], MD     Family History  Problem Relation Age of Onset   Heart attack Brother    Colon cancer Neg Hx     Social History   Socioeconomic History   Marital status: Married    Spouse name: Larita Fife   Number of children: 2   Years of education: Not on file   Highest education level: Bachelor's degree (e.g., BA, AB, BS)  Occupational History   Occupation: PT Holiday representative: RETIRED  Tobacco Use   Smoking status: Former    Current packs/day: 0.00    Average packs/day: 0.5 packs/day for 2.0 years (1.0 ttl  pk-yrs)    Types: Cigarettes    Start date: 09/03/1964    Quit date: 09/03/1966    Years since quitting: 56.6   Smokeless tobacco: Never  Vaping Use   Vaping status: Never Used  Substance and Sexual Activity   Alcohol use: Yes    Alcohol/week: 11.0 standard drinks of alcohol    Types: 7 Glasses of wine, 4 Cans of beer per week    Comment: a glass of wine with dinner/ beer on the weekend   Drug use: No   Sexual activity: Not on file  Other Topics Concern   Not on file  Social History Narrative   Lives with his wife.   Social Determinants of Health   Financial Resource Strain: Low Risk  (04/03/2023)   Overall Financial Resource Strain (CARDIA)    Difficulty of Paying Living Expenses: Not very hard  Food Insecurity: No Food Insecurity (04/03/2023)   Hunger Vital Sign    Worried About Running Out of Food in the Last Year: Never true    Ran Out of Food in the Last Year: Never true  Transportation Needs: No Transportation Needs (04/03/2023)   PRAPARE - Administrator, Civil Service (Medical): No    Lack of Transportation (Non-Medical): No  Physical Activity: Unknown (11/25/2022)   Exercise Vital Sign    Days of Exercise per Week: 0 days    Minutes of Exercise per Session: Not on file  Stress: No Stress Concern Present (01/04/2023)   [employer] of Occupational Health - Occupational Stress Questionnaire    Feeling of Stress : Not at all  Social Connections: Moderately Integrated (11/25/2022)   Social Connection and Isolation Panel [NHANES]    Frequency of Communication with Friends and Family: Once a week    Frequency of Social Gatherings with Friends and Family: Once a week    Attends Religious Services: More than 4 times per year    Active Member of [employer] or Organizations: Yes    Attends Engineer, structural: More than 4 times per year    Marital Status: Married    Review of Systems: A 12 point ROS discussed and pertinent positives are indicated in the HPI  above.  All other systems are negative.  Review of Systems  Constitutional:  Negative for appetite change and fatigue.  Respiratory:  Negative for cough and shortness of breath.   Cardiovascular:  Positive for leg swelling. Negative for chest pain.  Gastrointestinal:  Negative for abdominal pain, diarrhea, nausea and vomiting.  Neurological:  Negative for dizziness and headaches.    Vital Signs: BP 94/73 (BP Location: Left Arm)   Pulse 84   Temp 97.8 F (36.6  C) (Oral)   Resp 15   Ht 5\' 11"  (1.803 m)   Wt 195 lb 8.8 oz (88.7 kg)   SpO2 93%   BMI 27.27 kg/m   Physical Exam Constitutional:      General: He is not in acute distress.    Appearance: He is not ill-appearing.  HENT:     Mouth/Throat:     Mouth: Mucous membranes are moist.     Pharynx: Oropharynx is clear.  Cardiovascular:     Rate and Rhythm: Normal rate and regular rhythm.  Pulmonary:     Effort: Pulmonary effort is normal.  Abdominal:     Tenderness: There is no abdominal tenderness.  Musculoskeletal:     Right lower leg: Edema present.     Left lower leg: Edema present.  Neurological:     Mental Status: He is alert and oriented to person, place, and time.  Psychiatric:        Mood and Affect: Mood normal.        Behavior: Behavior normal.        Thought Content: Thought content normal.        Judgment: Judgment normal.     Imaging: ECHOCARDIOGRAM COMPLETE  Result Date: 04/02/2023    ECHOCARDIOGRAM REPORT   Patient Name:   Leonard Cisneros Date of Exam: 04/02/2023 Medical Rec #:  [id number]        Height:       71.0 in Accession #:    [id number]       Weight:       198.6 lb Date of Birth:  1941/01/08       BSA:          2.102 m Patient Age:    81 years         BP:           110/74 mmHg Patient Gender: M                HR:           100 bpm. Exam Location:  Inpatient Procedure: 2D Echo, Cardiac Doppler and Color Doppler Indications:    CHF I50.21  History:        Patient has prior history of  Echocardiogram examinations, most                 recent 09/25/2022. CAD, ETOH abuse, Arrythmias:Atrial                 Fibrillation; Risk Factors:Hypertension, Dyslipidemia and Former                 Smoker.  Sonographer:    Dondra Prader RVT RCS Referring Phys: [phone] WHITNEY D HARRIS IMPRESSIONS  1. Left ventricular ejection fraction, by estimation, is 30 to 35%. The left ventricle has moderately decreased function. The left ventricle demonstrates global hypokinesis. The left ventricular internal cavity size was mildly dilated. There is mild concentric left ventricular hypertrophy. Left ventricular diastolic parameters are indeterminate.  2. Right ventricular systolic function is mildly reduced. The right ventricular size is mildly enlarged. Tricuspid regurgitation signal is inadequate for assessing PA pressure.  3. Left atrial size was severely dilated.  4. Right atrial size was moderately dilated.  5. The mitral valve is normal in structure. Mild mitral valve regurgitation. Mild mitral stenosis. The mean mitral valve gradient is 6.0 mmHg. Moderate mitral annular calcification.  6. The aortic valve is tricuspid. There is moderate calcification of the aortic valve. Aortic valve  regurgitation is not visualized. Mild aortic valve stenosis. Aortic valve mean gradient measures 14.0 mmHg.  7. The inferior vena cava is dilated in size with <50% respiratory variability, suggesting right atrial pressure of 15 mmHg. FINDINGS  Left Ventricle: Left ventricular ejection fraction, by estimation, is 30 to 35%. The left ventricle has moderately decreased function. The left ventricle demonstrates global hypokinesis. The left ventricular internal cavity size was mildly dilated. There is mild concentric left ventricular hypertrophy. Left ventricular diastolic parameters are indeterminate. Right Ventricle: The right ventricular size is mildly enlarged. No increase in right ventricular wall thickness. Right ventricular systolic function  is mildly reduced. Tricuspid regurgitation signal is inadequate for assessing PA pressure. Left Atrium: Left atrial size was severely dilated. Right Atrium: Right atrial size was moderately dilated. Pericardium: There is no evidence of pericardial effusion. Mitral Valve: The mitral valve is normal in structure. There is mild calcification of the mitral valve leaflet(s). Moderate mitral annular calcification. Mild mitral valve regurgitation. Mild mitral valve stenosis. MV peak gradient, 11.6 mmHg. The mean mitral valve gradient is 6.0 mmHg. Tricuspid Valve: The tricuspid valve is normal in structure. Tricuspid valve regurgitation is trivial. Aortic Valve: The aortic valve is tricuspid. There is moderate calcification of the aortic valve. Aortic valve regurgitation is not visualized. Mild aortic stenosis is present. Aortic valve mean gradient measures 14.0 mmHg. Aortic valve peak gradient measures 24.1 mmHg. Aortic valve area, by VTI measures 2.13 cm. Pulmonic Valve: The pulmonic valve was normal in structure. Pulmonic valve regurgitation is not visualized. Aorta: The aortic root is normal in size and structure. Venous: The inferior vena cava is dilated in size with less than 50% respiratory variability, suggesting right atrial pressure of 15 mmHg. IAS/Shunts: No atrial level shunt detected by color flow Doppler.  LEFT VENTRICLE PLAX 2D LVIDd:         6.10 cm LVIDs:         5.20 cm LV PW:         1.40 cm LV IVS:        1.30 cm LVOT diam:     2.20 cm LV SV:         86 LV SV Index:   41 LVOT Area:     3.80 cm  RIGHT VENTRICLE            IVC RV Basal diam:  4.50 cm    IVC diam: 3.20 cm RV Mid diam:    3.00 cm RV S prime:     8.51 cm/s TAPSE (M-mode): 0.8 cm LEFT ATRIUM              Index        RIGHT ATRIUM           Index LA diam:        5.90 cm  2.81 cm/m   RA Area:     27.20 cm LA Vol (A2C):   169.0 ml 80.38 ml/m  RA Volume:   96.85 ml  46.07 ml/m LA Vol (A4C):   140.0 ml 66.59 ml/m LA Biplane Vol: 169.0 ml  80.38 ml/m  AORTIC VALVE                     PULMONIC VALVE AV Area (Vmax):    2.32 cm      PV Vmax:       0.84 m/s AV Area (Vmean):   2.31 cm      PV Peak grad:  2.9 mmHg AV Area (  VTI):     2.13 cm AV Vmax:           245.33 cm/s AV Vmean:          168.667 cm/s AV VTI:            0.401 m AV Peak Grad:      24.1 mmHg AV Mean Grad:      14.0 mmHg LVOT Vmax:         149.75 cm/s LVOT Vmean:        102.375 cm/s LVOT VTI:          0.225 m LVOT/AV VTI ratio: 0.56  AORTA Ao Root diam: 3.20 cm Ao Asc diam:  3.50 cm MITRAL VALVE MV Area (PHT): 4.94 cm     SHUNTS MV Area VTI:   3.09 cm     Systemic VTI:  0.22 m MV Peak grad:  11.6 mmHg    Systemic Diam: 2.20 cm MV Mean grad:  6.0 mmHg MV Vmax:       1.70 m/s MV Vmean:      110.0 cm/s MV Decel Time: 154 msec MR Peak grad: 88.4 mmHg MR Mean grad: 61.0 mmHg MR Vmax:      470.00 cm/s MR Vmean:     368.0 cm/s MV E velocity: 152.00 cm/s MV A velocity: 37.70 cm/s MV E/A ratio:  4.03 Dalton McleanMD Electronically signed by Wilfred Lacy Signature Date/Time: 04/02/2023/4:37:17 PM    Final    DG Chest Port 1 View  Result Date: 03/31/2023 CLINICAL DATA:  Questionable sepsis.  Evaluate for abnormality. EXAM: PORTABLE CHEST 1 VIEW COMPARISON:  One-view chest 02/14/2023 FINDINGS: Heart is enlarged. Moderate pulmonary vascular congestion is present. Bilateral effusions are similar the prior exam. IMPRESSION: Cardiomegaly with moderate pulmonary vascular congestion and bilateral effusions compatible with congestive heart failure. Electronically Signed   By: Marin Roberts M.D.   On: 03/31/2023 10:32    Labs:  CBC: Recent Labs    03/31/23 2133 04/01/23 0207 04/02/23 0318 04/03/23 0330  WBC 12.9* 11.6* 7.6 7.0  HGB 8.4* 8.3* 8.7* 8.6*  HCT 25.2* 24.4* 26.2* 26.5*  PLT 182 180 172 205    COAGS: Recent Labs    11/18/22 2113 12/26/22 0631 02/14/23 0913 03/31/23 1004  INR 1.3* 1.9* 1.2 1.3*  APTT  --   --   --  32    BMP: Recent Labs    03/31/23 2135  04/01/23 0207 04/02/23 0318 04/03/23 0330  NA 135 136 135 133*  K 3.9 3.7 4.0 3.4*  CL 101 101 99 98  CO2 23 27 25 24   GLUCOSE 108* 103* 99 91  BUN 34* 32* 23 17  CALCIUM 7.7* 7.7* 8.0* 7.9*  CREATININE 1.74* 1.66* 1.33* 1.03  GFRNONAA 39* 41* 54* >60    LIVER FUNCTION TESTS: Recent Labs    12/27/22 0742 12/29/22 0651 01/10/23 0958 02/13/23 0824 02/14/23 0913 03/31/23 1004  BILITOT 4.3* 3.7* 2.3*  --  3.1* 3.5*  AST 42* 51* 38*  --  42* 67*  ALT 41 49* 34  --  31 41  ALKPHOS 226* 243*  --  436* 356* 284*  PROT 5.6* 5.2* 5.4*  --  6.1* 5.8*  ALBUMIN 2.8* 2.6*  --   --  3.1* 2.5*    TUMOR MARKERS: No results for input(s): "AFPTM", "CEA", "CA199", "CHROMGRNA" in the last 8760 hours.  Assessment and Plan:  Anemia of unclear etiology: Verdie B. 60, 82 year old male, is tentatively scheduled 04/04/23 for an  image-guided bone marrow biopsy with aspiration.  Risks and benefits of this procedure were discussed with the patient and/or patient's family including, but not limited to bleeding, infection, damage to adjacent structures or low yield requiring additional tests.  All of the questions were answered and there is agreement to proceed. He will be NPO at midnight. CBC with differential ordered for the morning.    Consent signed and in IR.   Thank you for this interesting consult.  I greatly enjoyed meeting Leonard Cisneros and look forward to participating in their care.  A copy of this report was sent to the requesting provider on this date.  Electronically Signed: Alwyn Ren, AGACNP-BC [phone] 04/03/2023, 3:52 PM   I spent a total of 20 Minutes    in face to face in clinical consultation, greater than 50% of which was counseling/coordinating care for bone marrow biopsy with aspiration.

## 2023-04-03 NOTE — Progress Notes (Signed)
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  Admitted from: Home  Presentation:   Leonard Cisneros presented with generalized weakness, pain in right leg , started oozing a few days ago, Chronic lower extremity wounds, was just on a beach vacation for the last 2 weeks and came back. BP 84/53, HR 78, had a episode of melena, lactic acid 3.1, Hgb 6.2, blood transfusions given, anemia improved,  Patient was re-education the sign and symptoms of heart failure, daily weights, when to call his doctor or go to the ED, diet/ fluid restrictions, taking all medications as prescribed and attending all medical appointments, patient verbalized his understanding, a HF TOC follow up appointment was scheduled for 04/17/2023 @ 12 noon.   ECHO/ LVEF: 30-35% HfrEF  Clinical Course:  Past Medical History:  Diagnosis Date   Aortic stenosis    Atrial flutter (HCC)    a. s/p remote ablation by Dr. Ladona Ridgel.   CAD (coronary artery disease)    a. s/p CABG 2003. b. 01/2016: unstable angina - s/p BMS to SVG-ramus intermediate, patent LIMA-mLAD, patent RIMA-dRCA.    CHF (congestive heart failure) (HCC)    Chronic lower back pain    Essential hypertension    Hyperlipidemia    Ischemic cardiomyopathy    a. Cath 01/2016 -  EF 50-55% with mild distal inferior hypocontractility.   Peripheral neuropathy 04/04/2014   Permanent atrial fibrillation (HCC)      Social History   Socioeconomic History   Marital status: Married    Spouse name: Larita Fife   Number of children: 2   Years of education: Not on file   Highest education level: Bachelor's degree (e.g., BA, AB, BS)  Occupational History   Occupation: PT Holiday representative: RETIRED  Tobacco Use   Smoking status: Former    Current packs/day: 0.00    Average packs/day: 0.5 packs/day for 2.0 years (1.0 ttl pk-yrs)    Types:  Cigarettes    Start date: 09/03/1964    Quit date: 09/03/1966    Years since quitting: 56.6   Smokeless tobacco: Never  Vaping Use   Vaping status: Never Used  Substance and Sexual Activity   Alcohol use: Yes    Alcohol/week: 11.0 standard drinks of alcohol    Types: 7 Glasses of wine, 4 Cans of beer per week    Comment: a glass of wine with dinner/ beer on the weekend   Drug use: No   Sexual activity: Not on file  Other Topics Concern   Not on file  Social History Narrative   Lives with his wife.   Social Determinants of Health   Financial Resource Strain: Low Risk  (04/03/2023)   Overall Financial Resource Strain (CARDIA)    Difficulty of Paying Living Expenses: Not very hard  Food Insecurity: No Food Insecurity (04/03/2023)   Hunger Vital Sign    Worried About Running Out of Food in the Last Year: Never true    Ran Out of Food in the Last Year: Never true  Transportation Needs: No Transportation Needs (04/03/2023)   PRAPARE - Administrator, Civil Service (Medical): No    Lack of Transportation (Non-Medical): No  Physical Activity: Unknown (11/25/2022)   Exercise Vital Sign    Days of Exercise per Week: 0 days    Minutes of Exercise per Session: Not on file  Stress: No Stress Concern Present (01/04/2023)  [employer] of Occupational Health - Occupational Stress Questionnaire    Feeling of Stress : Not at all  Social Connections: Moderately Integrated (11/25/2022)   Social Connection and Isolation Panel [NHANES]    Frequency of Communication with Friends and Family: Once a week    Frequency of Social Gatherings with Friends and Family: Once a week    Attends Religious Services: More than 4 times per year    Active Member of [employer] or Organizations: Yes    Attends Engineer, structural: More than 4 times per year    Marital Status: Married   Water engineer and Provision:  Detailed education and instructions provided on heart failure disease  management including the following:  Signs and symptoms of Heart Failure When to call the physician Importance of daily weights Low sodium diet Fluid restriction Medication management Anticipated future follow-up appointments  Patient education given on each of the above topics.  Patient acknowledges understanding via teach back method and acceptance of all instructions.  Education Materials:  "Living Better With Heart Failure" Booklet, HF zone tool, & Daily Weight Tracker Tool.  Patient has scale at home: Yes Patient has pill box at home: yes    High Risk Criteria for Readmission and/or Poor Patient Outcomes: Heart failure hospital admissions (last 6 months): 1  No Show rate: 1% Difficult social situation: No Demonstrates medication adherence: Yes Primary Language: English Literacy level: Reading, writing, and comprehension  Barriers of Care:   Diet/ fluid restrictions ( decreased appetite, soda)  Daily weights  Considerations/Referrals:   Referral made to Heart Failure Pharmacist Stewardship: yes Referral made to Heart Failure CSW/NCM TOC: No Referral made to Heart & Vascular TOC clinic: Yes, 04/17/2023 @ 12 noon, was seen prior in [facility] 10/2022.   Items for Follow-up on DC/TOC: Diet/ fluid restrictions ( decreased appetite, soda) Daily weights Continued HF education   Rhae Hammock, BSN, RN Heart Failure Print production planner Chat Only

## 2023-04-03 NOTE — TOC Progression Note (Signed)
Transition of Care [facility]) - Progression Note    Patient Details  Name: Leonard Cisneros MRN: [id number] Date of Birth: Oct 14, 1940  [facility]) CM/SW Contact  Harriet Masson, RN Phone Number: 04/03/2023, 2:32 PM  Clinical Narrative:    Patient has used [facility] in the past and is agreeable to use them again.  Tresa Endo with [facility] accepted referral.    Expected Discharge Plan: Home w Home Health Services Barriers to Discharge: Continued Medical Work up  Expected Discharge Plan and Services   Discharge Planning Services: CM Consult Post Acute Care Choice: Home Health Living arrangements for the past 2 months: Single Family Home                           HH Arranged: PT HH Agency: [facility] Date HH Agency Contacted: 04/03/23 Time HH Agency Contacted: 1432 Representative spoke with at [facility] Agency: Tresa Endo   Social Determinants of Health (SDOH) Interventions SDOH Screenings   Food Insecurity: No Food Insecurity (04/03/2023)  Housing: Low Risk  (04/03/2023)  Transportation Needs: No Transportation Needs (04/03/2023)  Utilities: Not At Risk (02/14/2023)  Alcohol Screen: Low Risk  (04/03/2023)  Depression (PHQ2-9): Low Risk  (02/12/2023)  Financial Resource Strain: Low Risk  (04/03/2023)  Physical Activity: Unknown (11/25/2022)  Social Connections: Moderately Integrated (11/25/2022)  Stress: No Stress Concern Present (01/04/2023)  Tobacco Use: Medium Risk (03/31/2023)    Readmission Risk Interventions    12/26/2022   10:54 AM 09/25/2022   12:42 PM  Readmission Risk Prevention Plan  Transportation Screening Complete Complete  PCP or Specialist Appt within 5-7 Days  Not Complete  Home Care Screening  Complete  Medication Review (RN CM)  Complete  HRI or Home Care Consult Complete   Social Work Consult for Recovery Care Planning/Counseling Complete   Palliative Care Screening Not Applicable   Medication Review Oceanographer) Complete

## 2023-04-03 NOTE — Progress Notes (Signed)
Physical Therapy Treatment Patient Details Name: Leonard Cisneros MRN: [id number] DOB: 20-Mar-1941 Today's Date: 04/03/2023   History of Present Illness 82 year old man brought to ED on 7/28 with increasing lethargy and dyspnea at home  Found to have hemoglobin of 6.2, transfusion started. Pt hypotensive. PMH: ischemic cardiomyopathy with triple-vessel disease, s/p bypass 2003. CAD, HTN, CAD, CHF, LE sores, R worse than L.    PT Comments  Pt with report of feeling much better today and was received up in the chair. Pt ambulated with personal axillary crutches this date with good sequencing and stability. Pt continues with noted DOE however VSS. Acute PT to cont to follow.     If plan is discharge home, recommend the following: A little help with walking and/or transfers;A little help with bathing/dressing/bathroom;Assist for transportation;Help with stairs or ramp for entrance   Can travel by private vehicle        Equipment Recommendations  None recommended by PT    Recommendations for Other Services       Precautions / Restrictions Precautions Precautions: Fall Precaution Comments: R lower leg dressing with noted oozing/discharge Restrictions Weight Bearing Restrictions: No     Mobility  Bed Mobility               General bed mobility comments: pt up in chair upon PT arrival    Transfers Overall transfer level: Needs assistance Equipment used: Crutches Transfers: Sit to/from Stand Sit to Stand: Min assist           General transfer comment: minA to power up from chair, increased time, good management of crutches    Ambulation/Gait Ambulation/Gait assistance: Min guard Gait Distance (Feet): 120 Feet Assistive device: Crutches Gait Pattern/deviations: Step-through pattern, Decreased stride length, Trunk flexed Gait velocity: dec Gait velocity interpretation: 1.31 - 2.62 ft/sec, indicative of limited community ambulator   General Gait Details: pt's spouse  brought in his crutches from home. Pt with good crutch management/sequencing. Height should be lowered however pt has been using them "this way for 6 months". pt with increased trunk flexion leaning on crutches   Stairs             Wheelchair Mobility     Tilt Bed    Modified Rankin (Stroke Patients Only)       Balance Overall balance assessment: Needs assistance Sitting-balance support: Feet supported, No upper extremity supported Sitting balance-Leahy Scale: Good     Standing balance support: Bilateral upper extremity supported, During functional activity, Reliant on assistive device for balance Standing balance-Leahy Scale: Poor Standing balance comment: reliant on RW for support                            Cognition Arousal/Alertness: Awake/alert Behavior During Therapy: WFL for tasks assessed/performed Overall Cognitive Status: Within Functional Limits for tasks assessed                                          Exercises      General Comments General comments (skin integrity, edema, etc.): pt assisted to commode as pt with report of needing to have BM. Pt continues with R LE dressing with noted drainage. Pt with noted SOB however SpO2 >88% however poor peth reading      Pertinent Vitals/Pain Pain Assessment Pain Assessment: No/denies pain    Home Living  Prior Function            PT Goals (current goals can now be found in the care plan section) Acute Rehab PT Goals Patient Stated Goal: home PT Goal Formulation: With patient/family Time For Goal Achievement: 04/16/23 Potential to Achieve Goals: Good Progress towards PT goals: Progressing toward goals    Frequency    Min 1X/week      PT Plan Current plan remains appropriate    Co-evaluation              AM-PAC PT "6 Clicks" Mobility   Outcome Measure  Help needed turning from your back to your side while in a flat bed  without using bedrails?: None Help needed moving from lying on your back to sitting on the side of a flat bed without using bedrails?: None Help needed moving to and from a bed to a chair (including a wheelchair)?: A Little Help needed standing up from a chair using your arms (e.g., wheelchair or bedside chair)?: A Little Help needed to walk in hospital room?: A Little Help needed climbing 3-5 steps with a railing? : A Lot 6 Click Score: 19    End of Session Equipment Utilized During Treatment: Gait belt Activity Tolerance: Patient limited by fatigue Patient left: with family/visitor present (in bathroom) Nurse Communication: Mobility status (RN present to push chair behind patient) PT Visit Diagnosis: Unsteadiness on feet (R26.81);Muscle weakness (generalized) (M62.81)     Time: 6962-9528 PT Time Calculation (min) (ACUTE ONLY): 17 min  Charges:    $Gait Training: 8-22 mins PT General Charges $$ ACUTE PT VISIT: 1 Visit                     Lewis Shock, PT, DPT Acute Rehabilitation Services Secure chat preferred Office #: [phone]    Iona Hansen 04/03/2023, 1:39 PM

## 2023-04-03 NOTE — Plan of Care (Signed)
IR was requested for BMBX.    Patient was not made NPO, had breakfast at 0640 hrs per RN.  The procedure is tentatively scheduled for tomorrow pending IR schedule.   Made npo at midnight.  Formal consult to follow.   Please call IR for questions and concerns.    Lynann Bologna Lucillia Corson PA-C 04/03/2023 7:37 AM

## 2023-04-03 NOTE — Care Management Important Message (Signed)
Important Message  Patient Details  Name: Leonard Cisneros MRN: [id number] Date of Birth: 1940/12/16   Medicare Important Message Given:  Yes     Dorena Bodo 04/03/2023, 2:23 PM

## 2023-04-04 ENCOUNTER — Inpatient Hospital Stay (HOSPITAL_COMMUNITY): Payer: Medicare HMO

## 2023-04-04 DIAGNOSIS — E876 Hypokalemia: Secondary | ICD-10-CM

## 2023-04-04 DIAGNOSIS — I482 Chronic atrial fibrillation, unspecified: Secondary | ICD-10-CM | POA: Diagnosis not present

## 2023-04-04 DIAGNOSIS — K922 Gastrointestinal hemorrhage, unspecified: Secondary | ICD-10-CM | POA: Diagnosis not present

## 2023-04-04 DIAGNOSIS — I5021 Acute systolic (congestive) heart failure: Secondary | ICD-10-CM | POA: Diagnosis not present

## 2023-04-04 DIAGNOSIS — N179 Acute kidney failure, unspecified: Secondary | ICD-10-CM | POA: Diagnosis not present

## 2023-04-04 DIAGNOSIS — I5023 Acute on chronic systolic (congestive) heart failure: Secondary | ICD-10-CM | POA: Diagnosis not present

## 2023-04-04 DIAGNOSIS — D649 Anemia, unspecified: Secondary | ICD-10-CM | POA: Diagnosis not present

## 2023-04-04 MED ORDER — MIDAZOLAM HCL 2 MG/2ML IJ SOLN
INTRAMUSCULAR | Status: AC | PRN
  Administered 2023-04-04 (×2): .5 mg via INTRAVENOUS

## 2023-04-04 MED ORDER — DAPAGLIFLOZIN PROPANEDIOL 10 MG PO TABS
10.0000 mg | ORAL_TABLET | Freq: Every day | ORAL | Status: DC
  Administered 2023-04-04 – 2023-04-14 (×11): 10 mg via ORAL
  Filled 2023-04-04 (×11): qty 1

## 2023-04-04 MED ORDER — POTASSIUM CHLORIDE CRYS ER 20 MEQ PO TBCR
20.0000 meq | EXTENDED_RELEASE_TABLET | Freq: Every day | ORAL | Status: DC
  Administered 2023-04-05 – 2023-04-11 (×7): 20 meq via ORAL
  Filled 2023-04-04 (×7): qty 1

## 2023-04-04 MED ORDER — MAGNESIUM SULFATE 2 GM/50ML IV SOLN
2.0000 g | Freq: Once | INTRAVENOUS | Status: AC
  Administered 2023-04-04: 2 g via INTRAVENOUS
  Filled 2023-04-04: qty 50

## 2023-04-04 MED ORDER — LIDOCAINE HCL (PF) 1 % IJ SOLN
10.0000 mL | Freq: Once | INTRAMUSCULAR | Status: AC
  Administered 2023-04-04: 10 mL

## 2023-04-04 MED ORDER — FENTANYL CITRATE (PF) 100 MCG/2ML IJ SOLN
INTRAMUSCULAR | Status: AC | PRN
  Administered 2023-04-04 (×3): 25 ug via INTRAVENOUS

## 2023-04-04 MED ORDER — MIDAZOLAM HCL 2 MG/2ML IJ SOLN
INTRAMUSCULAR | Status: AC
  Filled 2023-04-04: qty 4

## 2023-04-04 MED ORDER — POTASSIUM CHLORIDE CRYS ER 10 MEQ PO TBCR
40.0000 meq | EXTENDED_RELEASE_TABLET | Freq: Once | ORAL | Status: AC
  Administered 2023-04-04: 40 meq via ORAL
  Filled 2023-04-04: qty 4

## 2023-04-04 MED ORDER — FENTANYL CITRATE (PF) 100 MCG/2ML IJ SOLN
INTRAMUSCULAR | Status: AC
  Filled 2023-04-04: qty 2

## 2023-04-04 NOTE — Procedures (Signed)
Interventional Radiology Procedure Note  Procedure: CT guided bone marrow aspiration and biopsy  Complications: None  EBL: < 10 mL  Findings: Aspirate and core biopsy performed of bone marrow in right iliac bone.  Plan: Bedrest supine x 1 hrs  Glenn T. Yamagata, M.D Pager:  [phone]   

## 2023-04-04 NOTE — Progress Notes (Signed)
Cardiologist:  [name]  Subjective:  Denies SSCP, palpitations or Dyspnea   Objective:  Vitals:   04/03/23 1957 04/04/23 0031 04/04/23 0320 04/04/23 0720  BP: 97/74 109/62 104/62 115/65  Pulse: 92 81 92   Resp: 15 18 20  (!) 22  Temp: 98 F (36.7 C) 97.6 F (36.4 C) 97.6 F (36.4 C) 97.8 F (36.6 C)  TempSrc: Oral Oral Oral Oral  SpO2: 93% 94% 100% 99%  Weight:   88.4 kg   Height:        Intake/Output from previous day:  Intake/Output Summary (Last 24 hours) at 04/04/2023 0832 Last data filed at 04/04/2023 0644 Gross per 24 hour  Intake --  Output 605 ml  Net -605 ml    Physical Exam:  Elderly male sitting in chair Exp wheezing/rhonchi SEM JVP elevated with V wave Abdomen benign Plus 2 edema with venous stasis changes   Lab Results: Basic Metabolic Panel: Recent Labs    04/03/23 0330 04/04/23 0308  NA 133* 132*  K 3.4* 3.4*  CL 98 98  CO2 24 26  GLUCOSE 91 88  BUN 17 13  CREATININE 1.03 0.93  CALCIUM 7.9* 7.8*  MG 1.7 1.8   Liver Function Tests: No results for input(s): "AST", "ALT", "ALKPHOS", "BILITOT", "PROT", "ALBUMIN" in the last 72 hours.  No results for input(s): "LIPASE", "AMYLASE" in the last 72 hours. CBC: Recent Labs    04/03/23 0330 04/04/23 0308  WBC 7.0 6.9  NEUTROABS  --  4.4  HGB 8.6* 8.3*  HCT 26.5* 25.5*  MCV 107.7* 108.1*  PLT 205 180    Anemia Panel: Recent Labs    04/02/23 0318 04/03/23 0330  VITAMINB12  --  784  RETICCTPCT 2.4  --     Imaging: ECHOCARDIOGRAM COMPLETE  Result Date: 04/02/2023    ECHOCARDIOGRAM REPORT   Patient Name:   LEVAN SOOY Date of Exam: 04/02/2023 Medical Rec #:  [id number]        Height:       71.0 in Accession #:    [id number]       Weight:       198.6 lb Date of Birth:  April 02, 1941       BSA:          2.102 m Patient Age:    81 years         BP:           110/74 mmHg Patient Gender: M                HR:           100 bpm. Exam Location:  Inpatient Procedure: 2D Echo,  Cardiac Doppler and Color Doppler Indications:    CHF I50.21  History:        Patient has prior history of Echocardiogram examinations, most                 recent 09/25/2022. CAD, ETOH abuse, Arrythmias:Atrial                 Fibrillation; Risk Factors:Hypertension, Dyslipidemia and Former                 Smoker.  Sonographer:    Dondra Prader RVT RCS Referring Phys: [phone] WHITNEY D HARRIS IMPRESSIONS  1. Left ventricular ejection fraction, by estimation, is 30 to 35%. The left ventricle has moderately decreased function. The left ventricle demonstrates global hypokinesis. The left ventricular internal cavity size was  mildly dilated. There is mild concentric left ventricular hypertrophy. Left ventricular diastolic parameters are indeterminate.  2. Right ventricular systolic function is mildly reduced. The right ventricular size is mildly enlarged. Tricuspid regurgitation signal is inadequate for assessing PA pressure.  3. Left atrial size was severely dilated.  4. Right atrial size was moderately dilated.  5. The mitral valve is normal in structure. Mild mitral valve regurgitation. Mild mitral stenosis. The mean mitral valve gradient is 6.0 mmHg. Moderate mitral annular calcification.  6. The aortic valve is tricuspid. There is moderate calcification of the aortic valve. Aortic valve regurgitation is not visualized. Mild aortic valve stenosis. Aortic valve mean gradient measures 14.0 mmHg.  7. The inferior vena cava is dilated in size with <50% respiratory variability, suggesting right atrial pressure of 15 mmHg. FINDINGS  Left Ventricle: Left ventricular ejection fraction, by estimation, is 30 to 35%. The left ventricle has moderately decreased function. The left ventricle demonstrates global hypokinesis. The left ventricular internal cavity size was mildly dilated. There is mild concentric left ventricular hypertrophy. Left ventricular diastolic parameters are indeterminate. Right Ventricle: The right ventricular  size is mildly enlarged. No increase in right ventricular wall thickness. Right ventricular systolic function is mildly reduced. Tricuspid regurgitation signal is inadequate for assessing PA pressure. Left Atrium: Left atrial size was severely dilated. Right Atrium: Right atrial size was moderately dilated. Pericardium: There is no evidence of pericardial effusion. Mitral Valve: The mitral valve is normal in structure. There is mild calcification of the mitral valve leaflet(s). Moderate mitral annular calcification. Mild mitral valve regurgitation. Mild mitral valve stenosis. MV peak gradient, 11.6 mmHg. The mean mitral valve gradient is 6.0 mmHg. Tricuspid Valve: The tricuspid valve is normal in structure. Tricuspid valve regurgitation is trivial. Aortic Valve: The aortic valve is tricuspid. There is moderate calcification of the aortic valve. Aortic valve regurgitation is not visualized. Mild aortic stenosis is present. Aortic valve mean gradient measures 14.0 mmHg. Aortic valve peak gradient measures 24.1 mmHg. Aortic valve area, by VTI measures 2.13 cm. Pulmonic Valve: The pulmonic valve was normal in structure. Pulmonic valve regurgitation is not visualized. Aorta: The aortic root is normal in size and structure. Venous: The inferior vena cava is dilated in size with less than 50% respiratory variability, suggesting right atrial pressure of 15 mmHg. IAS/Shunts: No atrial level shunt detected by color flow Doppler.  LEFT VENTRICLE PLAX 2D LVIDd:         6.10 cm LVIDs:         5.20 cm LV PW:         1.40 cm LV IVS:        1.30 cm LVOT diam:     2.20 cm LV SV:         86 LV SV Index:   41 LVOT Area:     3.80 cm  RIGHT VENTRICLE            IVC RV Basal diam:  4.50 cm    IVC diam: 3.20 cm RV Mid diam:    3.00 cm RV S prime:     8.51 cm/s TAPSE (M-mode): 0.8 cm LEFT ATRIUM              Index        RIGHT ATRIUM           Index LA diam:        5.90 cm  2.81 cm/m   RA Area:     27.20 cm LA Vol (A2C):  169.0 ml  80.38 ml/m  RA Volume:   96.85 ml  46.07 ml/m LA Vol (A4C):   140.0 ml 66.59 ml/m LA Biplane Vol: 169.0 ml 80.38 ml/m  AORTIC VALVE                     PULMONIC VALVE AV Area (Vmax):    2.32 cm      PV Vmax:       0.84 m/s AV Area (Vmean):   2.31 cm      PV Peak grad:  2.9 mmHg AV Area (VTI):     2.13 cm AV Vmax:           245.33 cm/s AV Vmean:          168.667 cm/s AV VTI:            0.401 m AV Peak Grad:      24.1 mmHg AV Mean Grad:      14.0 mmHg LVOT Vmax:         149.75 cm/s LVOT Vmean:        102.375 cm/s LVOT VTI:          0.225 m LVOT/AV VTI ratio: 0.56  AORTA Ao Root diam: 3.20 cm Ao Asc diam:  3.50 cm MITRAL VALVE MV Area (PHT): 4.94 cm     SHUNTS MV Area VTI:   3.09 cm     Systemic VTI:  0.22 m MV Peak grad:  11.6 mmHg    Systemic Diam: 2.20 cm MV Mean grad:  6.0 mmHg MV Vmax:       1.70 m/s MV Vmean:      110.0 cm/s MV Decel Time: 154 msec MR Peak grad: 88.4 mmHg MR Mean grad: 61.0 mmHg MR Vmax:      470.00 cm/s MR Vmean:     368.0 cm/s MV E velocity: 152.00 cm/s MV A velocity: 37.70 cm/s MV E/A ratio:  4.03 Dalton McleanMD Electronically signed by Wilfred Lacy Signature Date/Time: 04/02/2023/4:37:17 PM    Final     Cardiac Studies:  ECG: afib   Telemetry:  Afib rates 80's   Echo: EF 30-35% mild MR/AS   Medications:    atorvastatin  80 mg Oral Daily   Chlorhexidine Gluconate Cloth  6 each Topical Daily   feeding supplement  1 Container Oral TID BM   furosemide  40 mg Intravenous BID   metoprolol tartrate  12.5 mg Oral BID   pantoprazole  40 mg Oral BID   polyethylene glycol  17 g Oral Daily   potassium chloride  40 mEq Oral Once      sodium chloride     magnesium sulfate bolus IVPB      Assessment/Plan:   CHF:  EF normal prior to cath in January No chest pain or evidence of acute ischemic event. Continue lasix and lopressor Cr improved this am and now normal  No needing pressors/inotropes at this point  CAD/CABG:  cath 09/27/22 with patent RIMA/LIMA Agree with Dr  Excell Seltzer with negative troponin and no chest pain doubt acute arterial graft failure as cause of new LV dysfunction Not a candidate for cath Anemia:  severe stable Hct 25.5 Tarry stools per GI/primary team  Afib:  chronic rate fine Has had CTA to assess for Watchman can f/u as outpatient for this with Dr Lalla Brothers Not a candidate for anticoagulation with severe anemia and likely GI bleeding EGD 02/15/23 gastritis with duodenal ulcer negative H pylori. Colon 03/13/23 hemorrhoids two  polyps removed  BM biopsy per hematology Started on Miralax to prevent constipation   Charlton Haws 04/04/2023, 8:32 AM

## 2023-04-04 NOTE — Plan of Care (Signed)

## 2023-04-04 NOTE — Progress Notes (Signed)
Heart Failure Stewardship Pharmacist Progress Note   PCP: [name], FNP PCP-Cardiologist: [name], MD    HPI:  82 yo M with PMH of CHF, aflutter, CAD s/p CABG x3 (2003) and subsequent PCI, HTN, HLD, moderate aortic stenosis, permanent AF on apixaban.   Admitted 09/2022 with acute on chronic HFpEF after failed outpatient management with increasing torsemide. BNP 288. CXR with pulmonary edema. EF 55-60% on ECHO. Started on IV lasix and GDMT optimized. R/LHC with 3 vessel CAD and volume overload recommending medical management. He was started on Farxiga and approved for patient assistance from [name]. He was discharged and had follow up with HF TOC clinic. ReDS 38% and some volume overload. Continued on torsemide 80 mg daily and instructed to take additional 20 mg PRN for weight gain.   Has had a few hospitalizations since for acute on chronic HF and GI bleeds / anemia.   Presented back to the ED on 7/28 with generalized weakness, shortness of breath, LE edema, and weeping leg. Transferred from [facility] to [facility] with GI bleed, hgb 6.2, FOBT +. Initially hypotensive requiring norepinephrine. GI and hematology consulted. Transfused with 2 units PRBC and hgb has been stable. GI signed off. Hematology ordering bone marrow biopsy.   ECHO 7/30 showed LVEF 30-35%, global hypokinesis, mild concentric LVH, RV mildly reduced, mild MR, mild AS.   Current HF Medications: Diuretic: furosemide 40 mg IV BID Beta Blocker: metoprolol tartrate 12.5 mg BID  Prior to admission HF Medications: Diuretic: torsemide 40 mg BID Beta blocker: metoprolol tartrate 25 mg BID *was taking Farxiga 10 mg daily  Pertinent Lab Values: Serum creatinine 0.93, BUN 13, Potassium 3.4, Sodium 132, BNP 1121, Magnesium 1.8  Vital Signs: Weight: 194 lbs (admission weight: 189 lbs) Blood pressure: 100-110/70s  Heart rate: 80-90s  I/O: net -0.6L yesterday; net -1.6L since admission  Medication Assistance / Insurance  Benefits Check: Does the patient have prescription insurance?  Yes Type of insurance plan: [name]  Does the patient qualify for medication assistance through manufacturers or grants?   Yes Eligible grants and/or patient assistance programs: Farxiga Medication assistance applications in progress: none  Medication assistance applications approved: Farxiga Approved medication assistance renewals will be completed by: [facility]  Outpatient Pharmacy:  Prior to admission [name] Is the patient willing to use [facility] TOC pharmacy at discharge? Yes Is the patient willing to transition their outpatient pharmacy to utilize a [facility] outpatient pharmacy?  No    Assessment: 1. Acute on chronic systolic CHF (LVEF 30-35%). NYHA class III symptoms. - Continue furosemide 40 mg IV BID. Strict I/Os and daily weights. Keep K>4 and Mg>2. - Continue metoprolol tartrate 12.5 mg BID - BP starting to improve, consider low dose MRA and ARB prior to discharge - Consider restarting Farxiga 10 mg daily. Prescription was last written for 30 days so this med was removed from his list on day 31 by [employer]. He has been approved for patient assistance and receives this in the mail from [name]. Wife confirms he has this supply at home.    Plan: 1) Medication changes recommended at this time: - Restart Farxiga 10 mg daily  2) Patient assistance: - Approved for Farxiga patient assistance - last delivery for 90 day supply sent on 01/25/23  3)  Education  - Patient has been educated on current HF medications and potential additions to HF medication regimen - Patient verbalizes understanding that over the next few months, these medication doses may change and more  medications may be added to optimize HF regimen - Patient has been educated on basic disease state pathophysiology and goals of therapy   [name], PharmD, BCPS Heart Failure Stewardship Pharmacist Phone [phone]

## 2023-04-04 NOTE — Progress Notes (Signed)
Mobility Specialist Progress Note:   04/04/23 1000  Mobility  Activity Ambulated with assistance in hallway  Level of Assistance Contact guard assist, steadying assist  Assistive Device Crutches  Distance Ambulated (ft) 200 ft  Activity Response Tolerated well  Mobility Referral Yes  $Mobility charge 1 Mobility  Mobility Specialist Start Time (ACUTE ONLY) 1036  Mobility Specialist Stop Time (ACUTE ONLY) 1053  Mobility Specialist Time Calculation (min) (ACUTE ONLY) 17 min    Pre Mobility: 88 HR,  89/56 (67) BP During Mobility: 110 HR Post Mobility:  89 HR,  114/61 (74) BP  Pt received in chair, agreeable to mobility. C/o of some lower back pain prior to ambulation. Upon ambulating pt said "it feels better to get up". Asymptomatic throughout. Pt left in chair with call bell and all needs met.  D'Vante Earlene Plater Mobility Specialist Please contact via Special educational needs teacher or Rehab office at [phone]

## 2023-04-04 NOTE — Progress Notes (Addendum)
PROGRESS NOTE    Leonard Cisneros  BMW:[id number] DOB: [birth year] DOA: 03/31/2023 PCP: [name], FNP    Chief Complaint  Patient presents with   Weakness    Brief Narrative:  82 year old man with increasing lethargy and dyspnea at home. Found to have hemoglobin of 6.2 in ED.  Transfusion started.  Hypotensive into the 80s requiring norepinephrine.  Transferred [facility] for ICU admission. Denies frank melena.  No vomiting. History of ischemic cardiomyopathy with triple-vessel disease.  Prior bypass 2003 . Medical management was recommended.  EF is normal.  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 Atrial fibrillation status post Watchman. History of chronic anemia with negative colonoscopy and EGD.  Ferritin has been normal in the past.  B12 and folate normal.     Assessment & Plan:   Principal Problem:   GIB (gastrointestinal bleeding) Active Problems:   History of duodenal ulcer   Anemia   AKI (acute kidney injury) (HCC)   Acute on chronic HFrEF (heart failure with reduced ejection fraction) (HCC)   Microcytic anemia   Demand ischemia   Atrial fibrillation (HCC)  #1 acute decompensated biventricular systolic heart failure -Patient noted to have required pressors of norepinephrine on admission with complaints of significant shortness of breath and noted to have a significant anemia. -Troponins obtained on admission elevated however seem to plateau at 2178>> 2438>>> 2427. -Patient status post transfusion of 2 units PRBC with hemoglobin currently stable at 8.6. -2D echo obtained during this hospitalization with a EF of 30 to 35%, left ventricular global hypokinesis, mild concentric LVH, severely dilated left atrial size, moderately dilated right atrial size, mild MVR, mild MS.  Mild aortic valve stenosis. -EF on recent 2D echo significantly reduced from prior EF of 55 to 60% from January 2024. -BP somewhat soft. -Currently on  IV Lasix with urine output of 855 cc over the past 24 hours. -Patient being followed by cardiology who are recommending continuation of diuresis with Lasix and Lopressor and no need for pressors/ionotropes at this time. -Per cardiology.  2.  CAD/CABG/demand ischemia -Patient noted to have cath in 09/27/2022 with patent RIMA/LIMA.  Patient denies any chest pain and at this point in time cardiology does not feel patient is a candidate for catheterization recommending medical management at this time. -Per cardiology.  3.  Severe symptomatic anemia -Patient presented with severe symptomatic anemia with shortness of breath, noted to have a hemoglobin of 6.2 on presentation. -Status post transfusion 2 units PRBCs. -Hemoglobin currently stable at 8.3. -FOBT noted to be positive. -Patient with a macrocytic anemia. -Patient seen in consultation by GI and patient noted to have a recent EGD 02/15/2023 that showed mild gastritis and a clean-based 3 mm duodenal ulcer with biopsies negative for H. pylori. -Patient noted to have a colonoscopy 03/13/2023 which showed nonbleeding internal hemorrhoids and 2 small tubular adenomas which were removed. -Anemia panel not consistent with iron deficiency anemia, -Patient seen in consultation by hematology and bone marrow biopsy ordered which was done this morning 04/04/2023.  -Follow H&H. -Transfusion threshold hemoglobin < 8. -GI was following but have signed off as of 04/03/2023. -Per hematology/oncology.  4.  Permanent A-fib status post ablation -Currently rate controlled on Lopressor. -Not a anticoagulation candidate due to recurrent anemias and concern for prior history of GI bleed. -Per cardiology.  5.  Hyperlipidemia -Statin.  6.  Demand ischemia -Felt likely secondary to anemia in the setting of history of CAD status post CABG. -Troponins  on admission noted at 2150 seem to have plateaued and peaked at 2438. -Patient denies any angina/chest pain. -Keep  hemoglobin greater than 8. -Continue current cardiac regimen of statin, beta-blocker, diuretics.  Aspirin on hold in the setting of possible GI bleed/anemia. -Could likely resume aspirin in about a week.  7.  Chronic venous stasis dermatitis -Continue current local wound care.  8.  AKI -Likely secondary to prerenal azotemia in the setting of decompensated CHF, presentation with hypotension. -Renal function improved with transfusion of PRBC and diuresis. -Urine output of 855 cc over the past 24 hours. -Renal function improving daily. -Follow.  9.  Hypokalemia -Secondary to diuresis. -Replete.   DVT prophylaxis: SCDs. Code Status: Full Family Communication: Updated patient.  No family at bedside.  Disposition: Likely home when clinically improved and euvolemic, labs stable, cleared by cardiology.  Status is: Inpatient Remains inpatient appropriate because: Severity of illness   Consultants:  IR: Alwyn Ren, NP 04/03/2023 Oncology: Dr. Mosetta Putt 04/02/2023 Cardiology: Dr. Excell Seltzer 04/02/2023 Gastroenterology: Dr. Leonides Schanz 04/01/2023  Procedures:  Transfusion 2 units PRBC 03/31/2023 Chest x-ray 03/31/2023 2D echo 04/02/2023 CT bone marrow biopsy and aspiration per IR: Dr. Fredia Sorrow 04/04/2023  Antimicrobials:  Anti-infectives (From admission, onward)    Start     Dose/Rate Route Frequency Ordered Stop   03/31/23 1000  cefTRIAXone (ROCEPHIN) 2 g in sodium chloride 0.9 % 100 mL IVPB  Status:  Discontinued        2 g 200 mL/hr over 30 Minutes Intravenous Every 24 hours 03/31/23 0949 03/31/23 1530   03/31/23 1000  azithromycin (ZITHROMAX) 500 mg in sodium chloride 0.9 % 250 mL IVPB  Status:  Discontinued        500 mg 250 mL/hr over 60 Minutes Intravenous Every 24 hours 03/31/23 0949 03/31/23 1530         Subjective: Laying flat in bed.  Just returned from bone marrow biopsy.  Denies any chest pain.  States shortness of breath is improving.  Denies any bleeding.  Fatigue/tiredness  improving.  Denies any lightheadedness or dizziness.   Objective: Vitals:   04/04/23 0920 04/04/23 0925 04/04/23 0930 04/04/23 0935  BP: 101/63 97/60 99/60  96/70  Pulse: 84 78 73 90  Resp: (!) 24 (!) 21 18 20   Temp:      TempSrc:      SpO2: 100% 100% 100% 98%  Weight:      Height:        Intake/Output Summary (Last 24 hours) at 04/04/2023 1011 Last data filed at 04/04/2023 0644 Gross per 24 hour  Intake --  Output 605 ml  Net -605 ml   Filed Weights   04/02/23 0705 04/03/23 0500 04/04/23 0320  Weight: 90.1 kg 88.7 kg 88.4 kg    Examination:  General exam: Appears calm and comfortable  Respiratory system: Bibasilar crackles.  Decreased breath sounds in the bases.  Minimal expiratory wheezing.  Cardiovascular system: Irregularly irregular.  Positive JVD.  No murmurs rubs or gallops.  2+ bilateral lower extremity edema up to upper thighs/hips.  Lower extremity with chronic venous stasis changes. Gastrointestinal system: Abdomen is soft, nontender, nondistended, positive bowel sounds.  No rebound.  No guarding.  Central nervous system: Alert and oriented. No focal neurological deficits. Extremities: Right lower extremity in bandage.  2+ bilateral lower extremity edema up to hips, with chronic venous stasis changes noted.  Symmetric 5 x 5 power. Skin: No rashes, lesions or ulcers Psychiatry: Judgement and insight appear normal. Mood & affect appropriate.  Data Reviewed: I have personally reviewed following labs and imaging studies  CBC: Recent Labs  Lab 03/31/23 1004 03/31/23 1416 03/31/23 2133 04/01/23 0207 04/02/23 0318 04/03/23 0330 04/04/23 0308  WBC 11.9* 12.3* 12.9* 11.6* 7.6 7.0 6.9  NEUTROABS 9.2* 10.0*  --   --   --   --  4.4  HGB 6.2* 8.4* 8.4* 8.3* 8.7* 8.6* 8.3*  HCT 19.3* 27.4* 25.2* 24.4* 26.2* 26.5* 25.5*  MCV 116.3* 110.5* 104.6* 104.7* 106.9* 107.7* 108.1*  PLT 191 199 182 180 172 205 180    Basic Metabolic Panel: Recent Labs  Lab  03/31/23 2135 04/01/23 0207 04/02/23 0318 04/03/23 0330 04/04/23 0308  NA 135 136 135 133* 132*  K 3.9 3.7 4.0 3.4* 3.4*  CL 101 101 99 98 98  CO2 23 27 25 24 26   GLUCOSE 108* 103* 99 91 88  BUN 34* 32* 23 17 13   CREATININE 1.74* 1.66* 1.33* 1.03 0.93  CALCIUM 7.7* 7.7* 8.0* 7.9* 7.8*  MG 1.8  --   --  1.7 1.8    GFR: Estimated Creatinine Clearance: 66.3 mL/min (by C-G formula based on SCr of 0.93 mg/dL).  Liver Function Tests: Recent Labs  Lab 03/31/23 1004  AST 67*  ALT 41  ALKPHOS 284*  BILITOT 3.5*  PROT 5.8*  ALBUMIN 2.5*    CBG: Recent Labs  Lab 03/31/23 1613  GLUCAP 94     Recent Results (from the past 240 hour(s))  Blood Culture (routine x 2)     Status: None (Preliminary result)   Collection Time: 03/31/23 10:04 AM   Specimen: Right Antecubital; Blood  Result Value Ref Range Status   Specimen Description   Final    RIGHT ANTECUBITAL BOTTLES DRAWN AEROBIC AND ANAEROBIC   Special Requests Blood Culture adequate volume  Final   Culture   Final    NO GROWTH 4 DAYS Performed at [facility], [address], [place], [place] [address]    Report Status PENDING  Incomplete  Blood Culture (routine x 2)     Status: None (Preliminary result)   Collection Time: 03/31/23 10:04 AM   Specimen: Left Antecubital; Blood  Result Value Ref Range Status   Specimen Description   Final    LEFT ANTECUBITAL BOTTLES DRAWN AEROBIC AND ANAEROBIC   Special Requests   Final    Blood Culture results may not be optimal due to an excessive volume of blood received in culture bottles   Culture   Final    NO GROWTH 4 DAYS Performed at [facility], [address] [place], [place] [address]    Report Status PENDING  Incomplete  Resp panel by RT-PCR (RSV, Flu A&B, Covid) Anterior Nasal Swab     Status: None   Collection Time: 03/31/23 10:35 AM   Specimen: Anterior Nasal Swab  Result Value Ref Range Status   SARS Coronavirus 2 by RT PCR NEGATIVE NEGATIVE Final    Comment:  (NOTE) SARS-CoV-2 target nucleic acids are NOT DETECTED.  The SARS-CoV-2 RNA is generally detectable in upper respiratory specimens during the acute phase of infection. The lowest concentration of SARS-CoV-2 viral copies this assay can detect is 138 copies/mL. A negative result does not preclude SARS-Cov-2 infection and should not be used as the sole basis for treatment or other patient management decisions. A negative result may occur with  improper specimen collection/handling, submission of specimen other than nasopharyngeal swab, presence of viral mutation(s) within the areas targeted by this assay, and inadequate number  of viral copies(<138 copies/mL). A negative result must be combined with clinical observations, patient history, and epidemiological information. The expected result is Negative.  Fact Sheet for Patients:  BloggerCourse.com  Fact Sheet for Healthcare Providers:  SeriousBroker.it  This test is no t yet approved or cleared by the [place] FDA and  has been authorized for detection and/or diagnosis of SARS-CoV-2 by FDA under an Emergency Use Authorization (EUA). This EUA will remain  in effect (meaning this test can be used) for the duration of the COVID-19 declaration under Section 564(b)(1) of the Act, 21 U.S.C.section 360bbb-3(b)(1), unless the authorization is terminated  or revoked sooner.       Influenza A by PCR NEGATIVE NEGATIVE Final   Influenza B by PCR NEGATIVE NEGATIVE Final    Comment: (NOTE) The Xpert Xpress SARS-CoV-2/FLU/RSV plus assay is intended as an aid in the diagnosis of influenza from Nasopharyngeal swab specimens and should not be used as a sole basis for treatment. Nasal washings and aspirates are unacceptable for Xpert Xpress SARS-CoV-2/FLU/RSV testing.  Fact Sheet for Patients: BloggerCourse.com  Fact Sheet for Healthcare  Providers: SeriousBroker.it  This test is not yet approved or cleared by the [place] FDA and has been authorized for detection and/or diagnosis of SARS-CoV-2 by FDA under an Emergency Use Authorization (EUA). This EUA will remain in effect (meaning this test can be used) for the duration of the COVID-19 declaration under Section 564(b)(1) of the Act, 21 U.S.C. section 360bbb-3(b)(1), unless the authorization is terminated or revoked.     Resp Syncytial Virus by PCR NEGATIVE NEGATIVE Final    Comment: (NOTE) Fact Sheet for Patients: BloggerCourse.com  Fact Sheet for Healthcare Providers: SeriousBroker.it  This test is not yet approved or cleared by the [place] FDA and has been authorized for detection and/or diagnosis of SARS-CoV-2 by FDA under an Emergency Use Authorization (EUA). This EUA will remain in effect (meaning this test can be used) for the duration of the COVID-19 declaration under Section 564(b)(1) of the Act, 21 U.S.C. section 360bbb-3(b)(1), unless the authorization is terminated or revoked.  Performed at [facility], [address] [place], [place] [address]   MRSA Next Gen by PCR, Nasal     Status: None   Collection Time: 03/31/23  4:22 PM   Specimen: Nasal Mucosa; Nasal Swab  Result Value Ref Range Status   MRSA by PCR Next Gen NOT DETECTED NOT DETECTED Final    Comment: (NOTE) The GeneXpert MRSA Assay (FDA approved for NASAL specimens only), is one component of a comprehensive MRSA colonization surveillance program. It is not intended to diagnose MRSA infection nor to guide or monitor treatment for MRSA infections. Test performance is not FDA approved in patients less than 49 years old. Performed at [facility], [address] [place], [place] [address]          Radiology Studies: No results found.      Scheduled Meds:  atorvastatin  80 mg Oral  Daily   Chlorhexidine Gluconate Cloth  6 each Topical Daily   feeding supplement  1 Container Oral TID BM   furosemide  40 mg Intravenous BID   metoprolol tartrate  12.5 mg Oral BID   pantoprazole  40 mg Oral BID   polyethylene glycol  17 g Oral Daily   potassium chloride  40 mEq Oral Once   Continuous Infusions:  sodium chloride     magnesium sulfate bolus IVPB       LOS: 4  days    Time spent: 40 minutes    Ramiro Harvest, MD [facility]   To contact the attending provider between 7A-7P or the covering provider during after hours 7P-7A, please log into the web site [email or web address] and access using universal [place] password for that web site. If you do not have the password, please call the hospital operator.  04/04/2023, 10:11 AM

## 2023-04-05 ENCOUNTER — Ambulatory Visit (HOSPITAL_COMMUNITY): Payer: Medicare HMO

## 2023-04-05 DIAGNOSIS — N179 Acute kidney failure, unspecified: Secondary | ICD-10-CM | POA: Diagnosis not present

## 2023-04-05 DIAGNOSIS — D509 Iron deficiency anemia, unspecified: Secondary | ICD-10-CM | POA: Diagnosis not present

## 2023-04-05 DIAGNOSIS — D649 Anemia, unspecified: Secondary | ICD-10-CM | POA: Diagnosis not present

## 2023-04-05 DIAGNOSIS — I5021 Acute systolic (congestive) heart failure: Secondary | ICD-10-CM | POA: Diagnosis not present

## 2023-04-05 DIAGNOSIS — I5023 Acute on chronic systolic (congestive) heart failure: Secondary | ICD-10-CM | POA: Diagnosis not present

## 2023-04-05 DIAGNOSIS — K922 Gastrointestinal hemorrhage, unspecified: Secondary | ICD-10-CM | POA: Diagnosis not present

## 2023-04-05 MED ORDER — SIMETHICONE 80 MG PO CHEW
80.0000 mg | CHEWABLE_TABLET | Freq: Four times a day (QID) | ORAL | Status: DC
  Administered 2023-04-05 – 2023-04-14 (×33): 80 mg via ORAL
  Filled 2023-04-05 (×35): qty 1

## 2023-04-05 MED ORDER — ALBUMIN HUMAN 25 % IV SOLN
25.0000 g | Freq: Two times a day (BID) | INTRAVENOUS | Status: AC
  Administered 2023-04-05 – 2023-04-06 (×4): 25 g via INTRAVENOUS
  Filled 2023-04-05 (×4): qty 100

## 2023-04-05 MED ORDER — POLYETHYLENE GLYCOL 3350 17 G PO PACK
17.0000 g | PACK | Freq: Two times a day (BID) | ORAL | Status: DC
  Administered 2023-04-05 – 2023-04-13 (×14): 17 g via ORAL
  Filled 2023-04-05 (×18): qty 1

## 2023-04-05 MED ORDER — BISACODYL 10 MG RE SUPP
10.0000 mg | Freq: Once | RECTAL | Status: DC
  Filled 2023-04-05: qty 1

## 2023-04-05 NOTE — Progress Notes (Signed)
Rounding Note    Patient Name: Leonard Cisneros Date of Encounter: 04/05/2023  [facility] Cardiologist: [name], MD   Subjective   No acute overnight events. He reports feeling better today. His weakness has improved. He denies any chest pain, shortness of breath, lightheadedness, dizziness. He continues to have significant lower extremity edema and states his abdomen may be a little distended as well.  Inpatient Medications    Scheduled Meds:  atorvastatin  80 mg Oral Daily   Chlorhexidine Gluconate Cloth  6 each Topical Daily   dapagliflozin propanediol  10 mg Oral Daily   feeding supplement  1 Container Oral TID BM   furosemide  40 mg Intravenous BID   metoprolol tartrate  12.5 mg Oral BID   pantoprazole  40 mg Oral BID   polyethylene glycol  17 g Oral Daily   potassium chloride SA  20 mEq Oral Daily   Continuous Infusions:  sodium chloride     PRN Meds: docusate sodium, melatonin, mouth rinse   Vital Signs    Vitals:   04/04/23 1959 04/04/23 2347 04/04/23 2351 04/05/23 0319  BP: (!) 103/57  106/76 98/79  Pulse:  94 81 85  Resp: (!) 21 19 19 18   Temp:   97.6 F (36.4 C) 97.6 F (36.4 C)  TempSrc:   Oral Oral  SpO2:  93% 93% 94%  Weight:    88.1 kg  Height:        Intake/Output Summary (Last 24 hours) at 04/05/2023 0734 Last data filed at 04/05/2023 0300 Gross per 24 hour  Intake 45.06 ml  Output 1000 ml  Net -954.94 ml      04/05/2023    3:19 AM 04/04/2023    3:20 AM 04/03/2023    5:00 AM  Last 3 Weights  Weight (lbs) 194 lb 3.6 oz 194 lb 14.2 oz 195 lb 8.8 oz  Weight (kg) 88.1 kg 88.4 kg 88.7 kg      Telemetry    Atrial fibrillation with baseline rates in the 80s to low 100s. Occasionally in the 120s. - Personally Reviewed  ECG    No new ECG tracing today. - Personally Reviewed  Physical Exam   GEN: Elderly Caucasian male in no acute distress.   Neck: JVD elevated with patient sitting upright in chair. Cardiac: Irregularly  irregular rhythm with normal rate. II/VI murmur noted.   Respiratory: No increased work of breathing. Decreased breath sounds noted in right base. No significant wheezes, rhonchi, or rales appreciated. GI: Soft, mildly distended, and non-tender. MS: 3+ pitting edema of bilaterally lower extremities. Right lower extremity is weeping and is wrapped in gauze. Skin: Warm and dry. Neuro:  No focal deficits. Psych: Normal affect. Responds appropriately.  Labs    High Sensitivity Troponin:   Recent Labs  Lab 03/31/23 1004 03/31/23 1416 03/31/23 2133 03/31/23 2354 04/01/23 0542  TROPONINIHS 2,150* 1,742* 2,178* 2,438* 2,427*     Chemistry Recent Labs  Lab 03/31/23 1004 03/31/23 2135 04/03/23 0330 04/04/23 0308 04/05/23 0153  NA 130*   < > 133* 132* 135  K 4.1   < > 3.4* 3.4* 3.8  CL 98   < > 98 98 102  CO2 25   < > 24 26 26   GLUCOSE 115*   < > 91 88 97  BUN 38*   < > 17 13 10   CREATININE 1.94*   < > 1.03 0.93 1.07  CALCIUM 7.8*   < > 7.9* 7.8* 7.7*  MG  --    < > 1.7 1.8 2.0  PROT 5.8*  --   --   --   --   ALBUMIN 2.5*  --   --   --   --   AST 67*  --   --   --   --   ALT 41  --   --   --   --   ALKPHOS 284*  --   --   --   --   BILITOT 3.5*  --   --   --   --   GFRNONAA 34*   < > >60 >60 >60  ANIONGAP 7   < > 11 8 7    < > = values in this interval not displayed.    Lipids No results for input(s): "CHOL", "TRIG", "HDL", "LABVLDL", "LDLCALC", "CHOLHDL" in the last 168 hours.  Hematology Recent Labs  Lab 04/03/23 0330 04/04/23 0308 04/05/23 0153  WBC 7.0 6.9 8.1  RBC 2.46* 2.36* 2.50*  HGB 8.6* 8.3* 8.8*  HCT 26.5* 25.5* 27.1*  MCV 107.7* 108.1* 108.4*  MCH 35.0* 35.2* 35.2*  MCHC 32.5 32.5 32.5  RDW 24.0* 24.2* 23.7*  PLT 205 180 206   Thyroid No results for input(s): "TSH", "FREET4" in the last 168 hours.  BNP Recent Labs  Lab 03/31/23 1004  BNP 1,121.0*    DDimer No results for input(s): "DDIMER" in the last 168 hours.   Radiology    CT BONE MARROW  BIOPSY & ASPIRATION  Result Date: 04/04/2023 CLINICAL DATA:  Unexplained anemia and need for bone marrow biopsy. EXAM: CT GUIDED BONE MARROW ASPIRATION AND BIOPSY ANESTHESIA/SEDATION: Moderate (conscious) sedation was employed during this procedure. A total of Versed 1.0 mg and Fentanyl 75 mcg was administered intravenously by radiology nursing. Moderate Sedation Time: 13 minutes. The patient's level of consciousness and vital signs were monitored continuously by radiology nursing throughout the procedure under my direct supervision. PROCEDURE: The procedure risks, benefits, and alternatives were explained to the patient. Questions regarding the procedure were encouraged and answered. The patient understands and consents to the procedure. A time out was performed prior to initiating the procedure. The right gluteal region was prepped with chlorhexidine. Sterile gown and sterile gloves were used for the procedure. Local anesthesia was provided with 1% Lidocaine. Under CT guidance, an 11 gauge On Control bone cutting needle was advanced from a posterior approach into the right iliac bone. Needle positioning was confirmed with CT. Initial non heparinized and heparinized aspirate samples were obtained of bone marrow. Core biopsy was performed via the On Control drill needle. COMPLICATIONS: None FINDINGS: Inspection of initial aspirate did reveal visible particles. Intact core biopsy sample was obtained. IMPRESSION: CT guided bone marrow biopsy of right posterior iliac bone with both aspirate and core samples obtained. Electronically Signed   By: Irish Lack M.D.   On: 04/04/2023 10:45    Cardiac Studies   Right/ Left Cardiac Catheterization 09/27/2022:   Ost LAD to Prox LAD lesion is 100% stenosed.   Ramus lesion is 100% stenosed.   Prox RCA to Mid RCA lesion is 60% stenosed.   Dist RCA lesion is 90% stenosed.   Origin to Prox Graft lesion is 100% stenosed.   LIMA graft was visualized by angiography and is  large.   RIMA graft was visualized by non-selective angiography and is normal in caliber.   The graft exhibits no disease.   The graft exhibits no disease.   LV end diastolic  pressure is mildly elevated.   Hemodynamic findings consistent with mild pulmonary hypertension.   There is moderate aortic valve stenosis.   3 vessel occlusive CAD. The native LCx is widely patent LIMA to the LAD is patent RIMA to the PDA is patent SVG to the ramus intermediate is occluded. This appears to be chronic Mild to Moderate aortic stenosis. AV mean gradient 18.5 mm Hg. AVA 2.5 cm squared with index 1.15. Elevated LV filling pressures. LVEDP 20 mm Hg. Mean PCWP 24 mm Hg with large V waves to 42 mm Hg Mild pulmonary HTN. PAP 56/17 with mean 31 mm Hg.    Plan: medical management. Continue diuresis. Would benefit greatly from correction of severe anemia. Will need to monitor Aortic stenosis serially going forward.   Diagnostic Dominance: Right  _______________  Echocardiogram 04/02/2023: Impressions: 1. Left ventricular ejection fraction, by estimation, is 30 to 35%. The  left ventricle has moderately decreased function. The left ventricle  demonstrates global hypokinesis. The left ventricular internal cavity size  was mildly dilated. There is mild  concentric left ventricular hypertrophy. Left ventricular diastolic  parameters are indeterminate.   2. Right ventricular systolic function is mildly reduced. The right  ventricular size is mildly enlarged. Tricuspid regurgitation signal is  inadequate for assessing PA pressure.   3. Left atrial size was severely dilated.   4. Right atrial size was moderately dilated.   5. The mitral valve is normal in structure. Mild mitral valve  regurgitation. Mild mitral stenosis. The mean mitral valve gradient is 6.0  mmHg. Moderate mitral annular calcification.   6. The aortic valve is tricuspid. There is moderate calcification of the  aortic valve. Aortic valve  regurgitation is not visualized. Mild aortic  valve stenosis. Aortic valve mean gradient measures 14.0 mmHg.   7. The inferior vena cava is dilated in size with <50% respiratory  variability, suggesting right atrial pressure of 15 mmHg.    Patient Profile     82 y.o. male with a history of CAD s/p remote CABG x3 in 2003 with subsequent BMS to SVG-RI in 2017 (last cath in 09/2022 showed 2/3 patent grafts with occluded SVG-RI), ischemic cardiomyopathy/ chronic HFpEF with EF of 55-60% on Echo in 09/2022, permanent atrial fibrillation and paroxysmal atrial flutter not on anticoagulation given GI bleeds and anemia, aortic stenosis, hypertension, hyperlipidemia, chronic venous stasis dermatitis, and GI bleeding with recurrent symptomatic anemia who presented on 03/31/2023 with weakness and was noted to be hypotension with a hemoglobin of 6.2. He was admitted for decompensated CHF and recurrent anemia. He was started on pressors and transfused with PRBC. Echo showed a new drop in EF to 30-35%. Cardiology was consulted for assistance.  Assessment & Plan    Acute on Chronic HFrEF Patient has a history of chronic HFpEF with EF of 55-60% on Echo in 09/2022. He presented with generalized and was found to be in decompensated CHF and severely anemic. BNP elevated at 1,121. Chest x-ray showed cardiomegaly with moderate pulmonary vascular congestion and bilateral effusions compatible with CHF. Echo showed LVEF of 30-35% with global hypokinesis and mild LVH, mildly reduced RV, biatrial enlargement (left > right), mild AS (mean gradient 14.0), and mild MR/ MS. Patient was started on IV Lasix. Documented 1 L of urinary output yesterday andnet negative 2.3 L this admission.  - Still looks volume overloaded. - Continue IV Lasix 40mg  twice daily for now. May actually benefit from higher dose but hesitant to do this with his current BP. Will review  with MD. - GDMT limited due to hypotension. Only on low dose Lopressor for rate  control.  - Continue to monitor daily weights, strict I/Os, and renal function. - No evidence of an acute ischemic event to explain drop in EF. He is not a candidate for cardiac catheterization with his severe anemia.  Demand Ischemia CAD s/p CABG S/p remote CABG in 2003 with subsequent PCI with BMS to SVG-RI. Last cath in 09/2022 showed an occluded SVG to RI but patent LIMA to LAD and RIMA to PDA. Continued medical therapy recommended. High-sensitivity troponin peaked at 2,438 but this is felt to be due to demand ischemia from decompensate CHF and severe anemia. Echo showed a drop in EF to 30-35% with global hypokinesis.. - No chest pain. - No Aspirin given severe anemia. - Continue beta-blocker and high-intensity statin. - Given no chest pain and troponin consistent with demand ischemia, drop in EF not felt to be due to acute arterial graft failure. Also not a candidate for cardiac catheterization at this time.  Permanent Atrial Fibrillation Rates mostly in the 80s to low 100s. - Continue Lopressor 12.5mg  twice daily. - CHA2DS2-VASc = 5 (CAD, CHF, HTN, age x2). Not a candidate for anticoagulation with severe anemia and GI bleeding. He has had CTA to assess for Watchman and can continue to follow-up with Dr. Lalla Brothers as an outpatient.  Hypotension He was hypotensive on arrival and initially required Levophed in addition to blood transfusion. BP remains soft but is stable. - Continue low dose Lopressor as above.  Hyperlipidemia - Continue Lipitor 80mg  daily.  Acute on Chronic Anemia Hemoglobin 6.2 on admission. Baseline around 7.5. Hemoccult positive. Recent EGD on 02/15/2023 showed gastritis and a non-bleeding duodenal ulcer with a clean ulcer base. Recent colonoscopy on 03/13/2023 showed non-bleeding internal hemorrhoid and two 3-81mm polyps in the ascending colon and in the cecum which were removed with a cold snare. GI and Hematology consulted and anemia not felt to be due to GI bleeding.  Reticulocyte count has been low indicating a bone marrow production issue. S/p bone marrow biopsy on 8/1.  - Hemoglobin stable at 8.8. - Management per Internal Medicine and Hematology.  AKI on CKD Stage III Creatinine 1.94 on admission. Baseline around 0.9 to 1.2. Likely due to hypotension and decompensated CHF. Improved. - Creatinine stable at 1.07 today.  - Continue to monitor with diuresis.    For questions or updates, please contact [facility] Please consult [email or web address] for contact info under        Signed, Corrin Parker, PA-C  04/05/2023, 7:34 AM

## 2023-04-05 NOTE — Progress Notes (Addendum)
Physical Therapy Treatment Patient Details Name: Leonard Cisneros MRN: [id number] DOB: 06-21-1941 Today's Date: 04/05/2023   History of Present Illness 82 year old man brought to ED on 7/28 with increasing lethargy and dyspnea at home  Found to have hemoglobin of 6.2, transfusion started. Pt hypotensive. PMH: ischemic cardiomyopathy with triple-vessel disease, s/p bypass 2003. CAD, HTN, CAD, CHF, LE sores, R worse than L.    PT Comments  Pt continuing to make steady progress with his functional mobility. Pt was ambulating alone on the unit with his crutches upon PT arrival. He reported that he has been up and walking frequently throughout the day. Pt very eager to (hopefully) return home tomorrow as it is his 56th wedding anniversary with his wife. Pt would continue to benefit from skilled physical therapy services at this time while admitted and after d/c to address the below listed limitations in order to improve overall safety and independence with functional mobility.     If plan is discharge home, recommend the following: A little help with walking and/or transfers;A little help with bathing/dressing/bathroom;Assist for transportation;Help with stairs or ramp for entrance   Can travel by private vehicle        Equipment Recommendations  None recommended by PT    Recommendations for Other Services       Precautions / Restrictions Precautions Precautions: Fall Precaution Comments: R lower leg dressing with noted oozing/discharge Restrictions Weight Bearing Restrictions: No     Mobility  Bed Mobility               General bed mobility comments: pt ambulating in hallway upon PT arrival    Transfers Overall transfer level: Modified independent Equipment used: Crutches                    Ambulation/Gait Ambulation/Gait assistance: Supervision Gait Distance (Feet): 40 Feet Assistive device: Crutches Gait Pattern/deviations: Step-through pattern, Decreased  stride length, Trunk flexed Gait velocity: decreased     General Gait Details: pt with noteable flexed trunk position with personal crutches not adjusted to appropriate height; however, pt stating this is how he has been using them "for 6 months". no instability or LOB, no physical assistance needed   Stairs             Wheelchair Mobility     Tilt Bed    Modified Rankin (Stroke Patients Only)       Balance Overall balance assessment: Needs assistance Sitting-balance support: Feet supported, No upper extremity supported Sitting balance-Leahy Scale: Good     Standing balance support: During functional activity, Bilateral upper extremity supported, Single extremity supported Standing balance-Leahy Scale: Poor                              Cognition Arousal/Alertness: Awake/alert Behavior During Therapy: WFL for tasks assessed/performed Overall Cognitive Status: Within Functional Limits for tasks assessed                                          Exercises      General Comments        Pertinent Vitals/Pain Pain Assessment Pain Assessment: No/denies pain    Home Living                          Prior Function  PT Goals (current goals can now be found in the care plan section) Acute Rehab PT Goals PT Goal Formulation: With patient/family Time For Goal Achievement: 04/16/23 Potential to Achieve Goals: Good Progress towards PT goals: Progressing toward goals    Frequency    Min 1X/week      PT Plan Current plan remains appropriate    Co-evaluation              AM-PAC PT "6 Clicks" Mobility   Outcome Measure  Help needed turning from your back to your side while in a flat bed without using bedrails?: None Help needed moving from lying on your back to sitting on the side of a flat bed without using bedrails?: None Help needed moving to and from a bed to a chair (including a wheelchair)?:  None Help needed standing up from a chair using your arms (e.g., wheelchair or bedside chair)?: None Help needed to walk in hospital room?: A Little Help needed climbing 3-5 steps with a railing? : A Lot 6 Click Score: 21    End of Session   Activity Tolerance: Patient tolerated treatment well Patient left: in chair;with call bell/phone within reach Nurse Communication: Mobility status PT Visit Diagnosis: Unsteadiness on feet (R26.81);Muscle weakness (generalized) (M62.81)     Time: 9147-8295 PT Time Calculation (min) (ACUTE ONLY): 11 min  Charges:    $Gait Training: 8-22 mins PT General Charges $$ ACUTE PT VISIT: 1 Visit                     Arletta Bale, DPT  Acute Rehabilitation Services Office [phone]    Alessandra Bevels  04/05/2023, 10:55 AM

## 2023-04-05 NOTE — Telephone Encounter (Signed)
Received message from Dr. Jena Gauss:  "Rourk, Gerrit Friends, MD  [name], NP; Henrietta Dine, RN; Ahmed, Juanetta Beets, MD; Lanelle Bal, DO; Filbert Schilder, NP Hello all: [place].  At this point, the short answer to anticoagulation is NO.  Will need to hold off the Watchman. As I generate this note, I see patient is in the ED at [facility] with a hemoglobin of 6 and occult blood positive stool.  Recent EGD colonoscopy on file: tiny 3 mm duodenal ulcer and a couple of adenomas removed from his colon.  I cannot tell about his H. pylori status.  Hopefully, no NSAIDs.  Will defer to the inpatient management team.  He needs further GI evaluation to include imaging of small bowel in the near future in all likelihood"   If the patient calls back, will inform him he is not a candidate for Watchman per GI.

## 2023-04-05 NOTE — Progress Notes (Signed)
Heart Failure Stewardship Pharmacist Progress Note   PCP: [name], FNP PCP-Cardiologist: [name], MD    HPI:  82 yo M with PMH of CHF, aflutter, CAD s/p CABG x3 (2003) and subsequent PCI, HTN, HLD, moderate aortic stenosis, permanent AF on apixaban.   Admitted 09/2022 with acute on chronic HFpEF after failed outpatient management with increasing torsemide. BNP 288. CXR with pulmonary edema. EF 55-60% on ECHO. Started on IV lasix and GDMT optimized. R/LHC with 3 vessel CAD and volume overload recommending medical management. He was started on Farxiga and approved for patient assistance from [name]. He was discharged and had follow up with HF TOC clinic. ReDS 38% and some volume overload. Continued on torsemide 80 mg daily and instructed to take additional 20 mg PRN for weight gain.   Has had a few hospitalizations since for acute on chronic HF and GI bleeds / anemia.   Presented back to the ED on 7/28 with generalized weakness, shortness of breath, LE edema, and weeping leg. Transferred from [facility] to [facility] with GI bleed, hgb 6.2, FOBT +. Initially hypotensive requiring norepinephrine. GI and hematology consulted. Transfused with 2 units PRBC and hgb has been stable. GI signed off. Hematology ordering bone marrow biopsy - done 8/1.   ECHO 7/30 showed LVEF 30-35%, global hypokinesis, mild concentric LVH, RV mildly reduced, mild MR, mild AS.   Current HF Medications: Diuretic: furosemide 40 mg IV BID Beta Blocker: metoprolol tartrate 12.5 mg BID SGLT2i: Farxiga 10 mg daily  Prior to admission HF Medications: Diuretic: torsemide 40 mg BID Beta blocker: metoprolol tartrate 25 mg BID *was taking Farxiga 10 mg daily  Pertinent Lab Values: Serum creatinine 1.07, BUN 10, Potassium 3.8, Sodium 135, BNP 1121, Magnesium 2.0  Vital Signs: Weight: 194 lbs (admission weight: 189 lbs) Blood pressure: 100/70s  Heart rate: 80s  I/O: net -1.2L yesterday; net -2.4L since  admission  Medication Assistance / Insurance Benefits Check: Does the patient have prescription insurance?  Yes Type of insurance plan: [name]  Does the patient qualify for medication assistance through manufacturers or grants?   Yes Eligible grants and/or patient assistance programs: Farxiga Medication assistance applications in progress: none  Medication assistance applications approved: Farxiga Approved medication assistance renewals will be completed by: [facility]  Outpatient Pharmacy:  Prior to admission outpatient pharmacy: [name] Is the patient willing to use [facility] pharmacy at discharge? Yes Is the patient willing to transition their outpatient pharmacy to utilize a [facility] outpatient pharmacy?  No    Assessment: 1. Acute on chronic systolic CHF (LVEF 30-35%). NYHA class III symptoms. - Continue furosemide 40 mg IV BID, consider increasing to 80 mg IV BID with persistent symptoms and modest urine output. Strict I/Os and daily weights. Keep K>4 and Mg>2. - Continue metoprolol tartrate 12.5 mg BID - BP low, no MRA or ARB yet to allow for aggressive diuresis - Continue Farxiga 10 mg daily. He has been approved for patient assistance and receives this in the mail from [name]. Wife confirms he has this supply at home. He will not need a new refill of this medication at discharge.   Plan: 1) Medication changes recommended at this time: - Increase IV lasix  2) Patient assistance: - Approved for Farxiga patient assistance - last delivery for 90 day supply sent on 01/25/23  3)  Education  - Patient has been educated on current HF medications and potential additions to HF medication regimen - Patient verbalizes understanding that over the next  few months, these medication doses may change and more medications may be added to optimize HF regimen - Patient has been educated on basic disease state pathophysiology and goals of therapy   [name], PharmD, BCPS Heart Failure  Stewardship Pharmacist Phone [phone]

## 2023-04-05 NOTE — Progress Notes (Signed)
BLAYKE CORDREY   DOB:1940/10/11   WG#:[id number]   VHQ#:[id number]  Hematology follow up   Subjective: Patient underwent bone marrow biopsy by IR 2 days ago.  His anemia has been stable, has not required blood transfusion lately.  He still has significant bilateral lower extremity edema, he is on diuretics.   Objective:  Vitals:   04/05/23 1202 04/05/23 1529  BP: (!) 98/48 (!) 96/56  Pulse: 91 93  Resp: 19 20  Temp: 97.6 F (36.4 C) 97.9 F (36.6 C)  SpO2: 96% 93%    Body mass index is 27.09 kg/m.  Intake/Output Summary (Last 24 hours) at 04/05/2023 1739 Last data filed at 04/05/2023 1600 Gross per 24 hour  Intake 567.71 ml  Output 900 ml  Net -332.29 ml     Sclerae unicteric  Oropharynx clear  No peripheral adenopathy  Lungs clear -- no rales or rhonchi  Heart regular rate and rhythm  Abdomen benign  MSK no focal spinal tenderness, (+) significant bilateral lower extremity edema, right leg is wrapped with gauze  Neuro nonfocal  CBG (last 3)  No results for input(s): "GLUCAP" in the last 72 hours.   Labs:   Urine Studies No results for input(s): "UHGB", "CRYS" in the last 72 hours.  Invalid input(s): "UACOL", "UAPR", "USPG", "UPH", "UTP", "UGL", "UKET", "UBIL", "UNIT", "UROB", "ULEU", "UEPI", "UWBC", "URBC", "UBAC", "CAST", "UCOM", "BILUA"  Basic Metabolic Panel: Recent Labs  Lab 03/31/23 2135 04/01/23 0207 04/02/23 0318 04/03/23 0330 04/04/23 0308 04/05/23 0153  NA 135 136 135 133* 132* 135  K 3.9 3.7 4.0 3.4* 3.4* 3.8  CL 101 101 99 98 98 102  CO2 23 27 25 24 26 26   GLUCOSE 108* 103* 99 91 88 97  BUN 34* 32* 23 17 13 10   CREATININE 1.74* 1.66* 1.33* 1.03 0.93 1.07  CALCIUM 7.7* 7.7* 8.0* 7.9* 7.8* 7.7*  MG 1.8  --   --  1.7 1.8 2.0   GFR Estimated Creatinine Clearance: 57.7 mL/min (by C-G formula based on SCr of 1.07 mg/dL). Liver Function Tests: Recent Labs  Lab 03/31/23 1004  AST 67*  ALT 41  ALKPHOS 284*  BILITOT 3.5*  PROT 5.8*  ALBUMIN  2.5*   No results for input(s): "LIPASE", "AMYLASE" in the last 168 hours. No results for input(s): "AMMONIA" in the last 168 hours. Coagulation profile Recent Labs  Lab 03/31/23 1004  INR 1.3*    CBC: Recent Labs  Lab 03/31/23 1004 03/31/23 1416 03/31/23 2133 04/01/23 0207 04/02/23 0318 04/03/23 0330 04/04/23 0308 04/05/23 0153  WBC 11.9* 12.3*   < > 11.6* 7.6 7.0 6.9 8.1  NEUTROABS 9.2* 10.0*  --   --   --   --  4.4  --   HGB 6.2* 8.4*   < > 8.3* 8.7* 8.6* 8.3* 8.8*  HCT 19.3* 27.4*   < > 24.4* 26.2* 26.5* 25.5* 27.1*  MCV 116.3* 110.5*   < > 104.7* 106.9* 107.7* 108.1* 108.4*  PLT 191 199   < > 180 172 205 180 206   < > = values in this interval not displayed.   Cardiac Enzymes: No results for input(s): "CKTOTAL", "CKMB", "CKMBINDEX", "TROPONINI" in the last 168 hours. BNP: Invalid input(s): "POCBNP" CBG: Recent Labs  Lab 03/31/23 1613  GLUCAP 94   D-Dimer No results for input(s): "DDIMER" in the last 72 hours. Hgb A1c No results for input(s): "HGBA1C" in the last 72 hours. Lipid Profile No results for input(s): "CHOL", "HDL", "LDLCALC", "TRIG", "CHOLHDL", "  LDLDIRECT" in the last 72 hours. Thyroid function studies No results for input(s): "TSH", "T4TOTAL", "T3FREE", "THYROIDAB" in the last 72 hours.  Invalid input(s): "FREET3" Anemia work up Recent Labs    04/03/23 0330  VITAMINB12 784   Microbiology Recent Results (from the past 240 hour(s))  Blood Culture (routine x 2)     Status: None   Collection Time: 03/31/23 10:04 AM   Specimen: Right Antecubital; Blood  Result Value Ref Range Status   Specimen Description   Final    RIGHT ANTECUBITAL BOTTLES DRAWN AEROBIC AND ANAEROBIC   Special Requests Blood Culture adequate volume  Final   Culture   Final    NO GROWTH 5 DAYS Performed at [facility], [address], [place], [place] [address]    Report Status 04/05/2023 FINAL  Final  Blood Culture (routine x 2)     Status: None   Collection Time:  03/31/23 10:04 AM   Specimen: Left Antecubital; Blood  Result Value Ref Range Status   Specimen Description   Final    LEFT ANTECUBITAL BOTTLES DRAWN AEROBIC AND ANAEROBIC   Special Requests   Final    Blood Culture results may not be optimal due to an excessive volume of blood received in culture bottles   Culture   Final    NO GROWTH 5 DAYS Performed at [facility], [address], [place], [place] [address]    Report Status 04/05/2023 FINAL  Final  Resp panel by RT-PCR (RSV, Flu A&B, Covid) Anterior Nasal Swab     Status: None   Collection Time: 03/31/23 10:35 AM   Specimen: Anterior Nasal Swab  Result Value Ref Range Status   SARS Coronavirus 2 by RT PCR NEGATIVE NEGATIVE Final    Comment: (NOTE) SARS-CoV-2 target nucleic acids are NOT DETECTED.  The SARS-CoV-2 RNA is generally detectable in upper respiratory specimens during the acute phase of infection. The lowest concentration of SARS-CoV-2 viral copies this assay can detect is 138 copies/mL. A negative result does not preclude SARS-Cov-2 infection and should not be used as the sole basis for treatment or other patient management decisions. A negative result may occur with  improper specimen collection/handling, submission of specimen other than nasopharyngeal swab, presence of viral mutation(s) within the areas targeted by this assay, and inadequate number of viral copies(<138 copies/mL). A negative result must be combined with clinical observations, patient history, and epidemiological information. The expected result is Negative.  Fact Sheet for Patients:  BloggerCourse.com  Fact Sheet for Healthcare Providers:  SeriousBroker.it  This test is no t yet approved or cleared by the [place] FDA and  has been authorized for detection and/or diagnosis of SARS-CoV-2 by FDA under an Emergency Use Authorization (EUA). This EUA will remain  in effect (meaning this test  can be used) for the duration of the COVID-19 declaration under Section 564(b)(1) of the Act, 21 U.S.C.section 360bbb-3(b)(1), unless the authorization is terminated  or revoked sooner.       Influenza A by PCR NEGATIVE NEGATIVE Final   Influenza B by PCR NEGATIVE NEGATIVE Final    Comment: (NOTE) The Xpert Xpress SARS-CoV-2/FLU/RSV plus assay is intended as an aid in the diagnosis of influenza from Nasopharyngeal swab specimens and should not be used as a sole basis for treatment. Nasal washings and aspirates are unacceptable for Xpert Xpress SARS-CoV-2/FLU/RSV testing.  Fact Sheet for Patients: BloggerCourse.com  Fact Sheet for Healthcare Providers: SeriousBroker.it  This test is not yet approved or cleared by the  [place] [employer] and has been authorized for detection and/or diagnosis of SARS-CoV-2 by FDA under an TEFL teacher (EUA). This EUA will remain in effect (meaning this test can be used) for the duration of the COVID-19 declaration under Section 564(b)(1) of the Act, 21 U.S.C. section 360bbb-3(b)(1), unless the authorization is terminated or revoked.     Resp Syncytial Virus by PCR NEGATIVE NEGATIVE Final    Comment: (NOTE) Fact Sheet for Patients: BloggerCourse.com  Fact Sheet for Healthcare Providers: SeriousBroker.it  This test is not yet approved or cleared by the [place] FDA and has been authorized for detection and/or diagnosis of SARS-CoV-2 by FDA under an Emergency Use Authorization (EUA). This EUA will remain in effect (meaning this test can be used) for the duration of the COVID-19 declaration under Section 564(b)(1) of the Act, 21 U.S.C. section 360bbb-3(b)(1), unless the authorization is terminated or revoked.  Performed at [facility], [address] [place], [place] [address]   MRSA Next Gen by PCR, Nasal     Status: None    Collection Time: 03/31/23  4:22 PM   Specimen: Nasal Mucosa; Nasal Swab  Result Value Ref Range Status   MRSA by PCR Next Gen NOT DETECTED NOT DETECTED Final    Comment: (NOTE) The GeneXpert MRSA Assay (FDA approved for NASAL specimens only), is one component of a comprehensive MRSA colonization surveillance program. It is not intended to diagnose MRSA infection nor to guide or monitor treatment for MRSA infections. Test performance is not FDA approved in patients less than 19 years old. Performed at [facility], [address] [place], [place] [address]       Studies:  CT BONE MARROW BIOPSY & ASPIRATION  Result Date: 04/04/2023 CLINICAL DATA:  Unexplained anemia and need for bone marrow biopsy. EXAM: CT GUIDED BONE MARROW ASPIRATION AND BIOPSY ANESTHESIA/SEDATION: Moderate (conscious) sedation was employed during this procedure. A total of Versed 1.0 mg and Fentanyl 75 mcg was administered intravenously by radiology nursing. Moderate Sedation Time: 13 minutes. The patient's level of consciousness and vital signs were monitored continuously by radiology nursing throughout the procedure under my direct supervision. PROCEDURE: The procedure risks, benefits, and alternatives were explained to the patient. Questions regarding the procedure were encouraged and answered. The patient understands and consents to the procedure. A time out was performed prior to initiating the procedure. The right gluteal region was prepped with chlorhexidine. Sterile gown and sterile gloves were used for the procedure. Local anesthesia was provided with 1% Lidocaine. Under CT guidance, an 11 gauge On Control bone cutting needle was advanced from a posterior approach into the right iliac bone. Needle positioning was confirmed with CT. Initial non heparinized and heparinized aspirate samples were obtained of bone marrow. Core biopsy was performed via the On Control drill needle. COMPLICATIONS: None FINDINGS:  Inspection of initial aspirate did reveal visible particles. Intact core biopsy sample was obtained. IMPRESSION: CT guided bone marrow biopsy of right posterior iliac bone with both aspirate and core samples obtained. Electronically Signed   By: Irish Lack M.D.   On: 04/04/2023 10:45    Assessment: 82 y.o.  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  Plan:  -The final bone marrow biopsy results still pending, I spoke with pathologist Dr. Laureen Ochs today.  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 -Lab results reviewed, his serum light chain level was elevated post, likely nonspecific, SPEP results still pending.  No lab evidence of nutritional anemia or hemolysis. -If he does have MDS, I will recommend ESA injections.  Probably can start next week after the final biopsy result is back. Pt is interested -I plan to see him in office after discharge, and manage his anemia.    Malachy Mood, MD 04/05/2023  5:39 PM

## 2023-04-05 NOTE — Progress Notes (Signed)
PROGRESS NOTE    Leonard Cisneros  FAO:[id number] DOB: [birth year] DOA: 03/31/2023 PCP: [name], FNP    Chief Complaint  Patient presents with   Weakness    Brief Narrative:  82 year old man with increasing lethargy and dyspnea at home. Found to have hemoglobin of 6.2 in ED.  Transfusion started.  Hypotensive into the 80s requiring norepinephrine.  Transferred from [facility] to [facility] for ICU admission. Denies frank melena.  No vomiting. History of ischemic cardiomyopathy with triple-vessel disease.  Prior bypass 2003 . Medical management was recommended.  EF is normal.  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 Atrial fibrillation status post Watchman. History of chronic anemia with negative colonoscopy and EGD.  Ferritin has been normal in the past.  B12 and folate normal.     Assessment & Plan:   Principal Problem:   GIB (gastrointestinal bleeding) Active Problems:   History of duodenal ulcer   Anemia   AKI (acute kidney injury) (HCC)   Acute on chronic HFrEF (heart failure with reduced ejection fraction) (HCC)   Microcytic anemia   Demand ischemia   Atrial fibrillation (HCC)  #1 acute decompensated biventricular systolic heart failure -Patient noted to have required pressors of norepinephrine on admission with complaints of significant shortness of breath and noted to have a significant anemia. -Troponins obtained on admission elevated however seem to plateau at 2178>> 2438>>> 2427. -Patient status post transfusion of 2 units PRBC with hemoglobin currently stable at 8.8. -2D echo obtained during this hospitalization with a EF of 30 to 35%, left ventricular global hypokinesis, mild concentric LVH, severely dilated left atrial size, moderately dilated right atrial size, mild MVR, mild MS.  Mild aortic valve stenosis. -EF on recent 2D echo significantly reduced from prior EF of 55 to 60% from January 2024. -BP somewhat soft. -Currently on  IV Lasix 40 mg every 12 hours, with urine output of 1 L over the past 24 hours.   -BP soft, patient with significant volume overload on examination and as such we will place on IV albumin twice daily x 4 doses in addition to current dose of IV Lasix.  -Patient being followed by cardiology who are recommending continuation of diuresis with Lasix and Lopressor and no need for pressors/ionotropes at this time. -Per cardiology.  2.  CAD/CABG/demand ischemia -Patient noted to have cath in 09/27/2022 with patent RIMA/LIMA.  Patient denies any chest pain and at this point in time cardiology does not feel patient is a candidate for catheterization recommending medical management at this time. -Per cardiology.  3.  Severe symptomatic anemia -Patient presented with severe symptomatic anemia with shortness of breath, noted to have a hemoglobin of 6.2 on presentation. -Status post transfusion 2 units PRBCs. -Hemoglobin currently stable at 8.8. -FOBT noted to be positive. -Patient with a macrocytic anemia. -Patient seen in consultation by GI and patient noted to have a recent EGD 02/15/2023 that showed mild gastritis and a clean-based 3 mm duodenal ulcer with biopsies negative for H. pylori. -Patient noted to have a colonoscopy 03/13/2023 which showed nonbleeding internal hemorrhoids and 2 small tubular adenomas which were removed. -Anemia panel not consistent with iron deficiency anemia, -Patient seen in consultation by hematology and bone marrow biopsy ordered which was done on 04/04/2023.  -Follow H&H. -Transfusion threshold hemoglobin < 8. -GI was following but have signed off as of 04/03/2023. -Per hematology/oncology.  4.  Permanent A-fib status post ablation -Currently rate controlled on Lopressor. -Not a anticoagulation candidate due to  recurrent anemias and concern for prior history of GI bleed. -Per cardiology.  5.  Hyperlipidemia -Continue statin.    6.  Demand ischemia -Felt likely  secondary to anemia in the setting of history of CAD status post CABG. -Troponins on admission noted at 2150 seem to have plateaued and peaked at 2438. -Patient denies any angina/chest pain. -Keep hemoglobin > 8. -Continue current cardiac regimen of statin, beta-blocker, diuretics.  Aspirin on hold in the setting of possible GI bleed/anemia. -Could likely resume aspirin in about a week.  7.  Chronic venous stasis dermatitis -Continue current local wound care.  8.  AKI -Likely secondary to prerenal azotemia in the setting of decompensated CHF, presentation with hypotension. -Renal function improved with transfusion of PRBC and diuresis. -Urine output of 1000 cc over the past 24 hours. -Renal function improving daily. -Monitor renal function with diuresis. -Follow.  9.  Hypokalemia -Secondary to diuresis. -Repleted.   DVT prophylaxis: SCDs. Code Status: Full Family Communication: Updated patient.  No family at bedside.  Disposition: Likely home when clinically improved and euvolemic, labs stable, cleared by cardiology.  Status is: Inpatient Remains inpatient appropriate because: Severity of illness   Consultants:  IR: Alwyn Ren, NP 04/03/2023 Oncology: Dr. Mosetta Putt 04/02/2023 Cardiology: Dr. Excell Seltzer 04/02/2023 Gastroenterology: Dr. Leonides Schanz 04/01/2023  Procedures:  Transfusion 2 units PRBC 03/31/2023 Chest x-ray 03/31/2023 2D echo 04/02/2023 CT bone marrow biopsy and aspiration per IR: Dr. Fredia Sorrow 04/04/2023  Antimicrobials:  Anti-infectives (From admission, onward)    Start     Dose/Rate Route Frequency Ordered Stop   03/31/23 1000  cefTRIAXone (ROCEPHIN) 2 g in sodium chloride 0.9 % 100 mL IVPB  Status:  Discontinued        2 g 200 mL/hr over 30 Minutes Intravenous Every 24 hours 03/31/23 0949 03/31/23 1530   03/31/23 1000  azithromycin (ZITHROMAX) 500 mg in sodium chloride 0.9 % 250 mL IVPB  Status:  Discontinued        500 mg 250 mL/hr over 60 Minutes Intravenous Every 24  hours 03/31/23 0949 03/31/23 1530         Subjective: Sitting up in chair.  Denies any chest pain or shortness of breath.  Fatigue/tiredness improved.  Denies any lightheadedness or dizziness.  No bowel movement in at least a couple of days.  Objective: Vitals:   04/04/23 2347 04/04/23 2351 04/05/23 0319 04/05/23 0746  BP:  106/76 98/79 97/79   Pulse: 94 81 85 87  Resp: 19 19 18 17   Temp:  97.6 F (36.4 C) 97.6 F (36.4 C) 97.6 F (36.4 C)  TempSrc:  Oral Oral Oral  SpO2: 93% 93% 94% 94%  Weight:   88.1 kg   Height:        Intake/Output Summary (Last 24 hours) at 04/05/2023 1106 Last data filed at 04/05/2023 0749 Gross per 24 hour  Intake 285.06 ml  Output 1000 ml  Net -714.94 ml   Filed Weights   04/03/23 0500 04/04/23 0320 04/05/23 0319  Weight: 88.7 kg 88.4 kg 88.1 kg    Examination:  General exam: Appears calm and comfortable  Respiratory system: Decreasing bibasilar crackles.  Minimal expiratory wheezing.  Fair air movement.  Speaking in full sentences.  Cardiovascular system: Irregularly irregular.  Positive JVD.  2-3+ bilateral lower extremity edema up to thighs/hips.  Lower extremity chronic venous stasis changes.   Gastrointestinal system: Abdomen is soft, nontender, mildly distended, positive bowel sounds.  No rebound.  No guarding.  Central nervous system: Alert and oriented. No  focal neurological deficits. Extremities: 2-3+ bilateral lower extremity edema up to hips, with chronic venous stasis changes noted.  Symmetric 5 x 5 power. Skin: No rashes, lesions or ulcers Psychiatry: Judgement and insight appear normal. Mood & affect appropriate.     Data Reviewed: I have personally reviewed following labs and imaging studies  CBC: Recent Labs  Lab 03/31/23 1004 03/31/23 1416 03/31/23 2133 04/01/23 0207 04/02/23 0318 04/03/23 0330 04/04/23 0308 04/05/23 0153  WBC 11.9* 12.3*   < > 11.6* 7.6 7.0 6.9 8.1  NEUTROABS 9.2* 10.0*  --   --   --   --  4.4  --    HGB 6.2* 8.4*   < > 8.3* 8.7* 8.6* 8.3* 8.8*  HCT 19.3* 27.4*   < > 24.4* 26.2* 26.5* 25.5* 27.1*  MCV 116.3* 110.5*   < > 104.7* 106.9* 107.7* 108.1* 108.4*  PLT 191 199   < > 180 172 205 180 206   < > = values in this interval not displayed.    Basic Metabolic Panel: Recent Labs  Lab 03/31/23 2135 04/01/23 0207 04/02/23 0318 04/03/23 0330 04/04/23 0308 04/05/23 0153  NA 135 136 135 133* 132* 135  K 3.9 3.7 4.0 3.4* 3.4* 3.8  CL 101 101 99 98 98 102  CO2 23 27 25 24 26 26   GLUCOSE 108* 103* 99 91 88 97  BUN 34* 32* 23 17 13 10   CREATININE 1.74* 1.66* 1.33* 1.03 0.93 1.07  CALCIUM 7.7* 7.7* 8.0* 7.9* 7.8* 7.7*  MG 1.8  --   --  1.7 1.8 2.0    GFR: Estimated Creatinine Clearance: 57.7 mL/min (by C-G formula based on SCr of 1.07 mg/dL).  Liver Function Tests: Recent Labs  Lab 03/31/23 1004  AST 67*  ALT 41  ALKPHOS 284*  BILITOT 3.5*  PROT 5.8*  ALBUMIN 2.5*    CBG: Recent Labs  Lab 03/31/23 1613  GLUCAP 94     Recent Results (from the past 240 hour(s))  Blood Culture (routine x 2)     Status: None   Collection Time: 03/31/23 10:04 AM   Specimen: Right Antecubital; Blood  Result Value Ref Range Status   Specimen Description   Final    RIGHT ANTECUBITAL BOTTLES DRAWN AEROBIC AND ANAEROBIC   Special Requests Blood Culture adequate volume  Final   Culture   Final    NO GROWTH 5 DAYS Performed at [facility], [address], [place], [place] [address]    Report Status 04/05/2023 FINAL  Final  Blood Culture (routine x 2)     Status: None   Collection Time: 03/31/23 10:04 AM   Specimen: Left Antecubital; Blood  Result Value Ref Range Status   Specimen Description   Final    LEFT ANTECUBITAL BOTTLES DRAWN AEROBIC AND ANAEROBIC   Special Requests   Final    Blood Culture results may not be optimal due to an excessive volume of blood received in culture bottles   Culture   Final    NO GROWTH 5 DAYS Performed at [facility], [address]  [place], [place] [address]    Report Status 04/05/2023 FINAL  Final  Resp panel by RT-PCR (RSV, Flu A&B, Covid) Anterior Nasal Swab     Status: None   Collection Time: 03/31/23 10:35 AM   Specimen: Anterior Nasal Swab  Result Value Ref Range Status   SARS Coronavirus 2 by RT PCR NEGATIVE NEGATIVE Final    Comment: (NOTE) SARS-CoV-2 target nucleic acids  are NOT DETECTED.  The SARS-CoV-2 RNA is generally detectable in upper respiratory specimens during the acute phase of infection. The lowest concentration of SARS-CoV-2 viral copies this assay can detect is 138 copies/mL. A negative result does not preclude SARS-Cov-2 infection and should not be used as the sole basis for treatment or other patient management decisions. A negative result may occur with  improper specimen collection/handling, submission of specimen other than nasopharyngeal swab, presence of viral mutation(s) within the areas targeted by this assay, and inadequate number of viral copies(<138 copies/mL). A negative result must be combined with clinical observations, patient history, and epidemiological information. The expected result is Negative.  Fact Sheet for Patients:  BloggerCourse.com  Fact Sheet for Healthcare Providers:  SeriousBroker.it  This test is no t yet approved or cleared by the [place] FDA and  has been authorized for detection and/or diagnosis of SARS-CoV-2 by FDA under an Emergency Use Authorization (EUA). This EUA will remain  in effect (meaning this test can be used) for the duration of the COVID-19 declaration under Section 564(b)(1) of the Act, 21 U.S.C.section 360bbb-3(b)(1), unless the authorization is terminated  or revoked sooner.       Influenza A by PCR NEGATIVE NEGATIVE Final   Influenza B by PCR NEGATIVE NEGATIVE Final    Comment: (NOTE) The Xpert Xpress SARS-CoV-2/FLU/RSV plus assay is intended as an aid in the diagnosis of  influenza from Nasopharyngeal swab specimens and should not be used as a sole basis for treatment. Nasal washings and aspirates are unacceptable for Xpert Xpress SARS-CoV-2/FLU/RSV testing.  Fact Sheet for Patients: BloggerCourse.com  Fact Sheet for Healthcare Providers: SeriousBroker.it  This test is not yet approved or cleared by the [place] FDA and has been authorized for detection and/or diagnosis of SARS-CoV-2 by FDA under an Emergency Use Authorization (EUA). This EUA will remain in effect (meaning this test can be used) for the duration of the COVID-19 declaration under Section 564(b)(1) of the Act, 21 U.S.C. section 360bbb-3(b)(1), unless the authorization is terminated or revoked.     Resp Syncytial Virus by PCR NEGATIVE NEGATIVE Final    Comment: (NOTE) Fact Sheet for Patients: BloggerCourse.com  Fact Sheet for Healthcare Providers: SeriousBroker.it  This test is not yet approved or cleared by the [place] FDA and has been authorized for detection and/or diagnosis of SARS-CoV-2 by FDA under an Emergency Use Authorization (EUA). This EUA will remain in effect (meaning this test can be used) for the duration of the COVID-19 declaration under Section 564(b)(1) of the Act, 21 U.S.C. section 360bbb-3(b)(1), unless the authorization is terminated or revoked.  Performed at [facility], [address] [place], [place] [address]   MRSA Next Gen by PCR, Nasal     Status: None   Collection Time: 03/31/23  4:22 PM   Specimen: Nasal Mucosa; Nasal Swab  Result Value Ref Range Status   MRSA by PCR Next Gen NOT DETECTED NOT DETECTED Final    Comment: (NOTE) The GeneXpert MRSA Assay (FDA approved for NASAL specimens only), is one component of a comprehensive MRSA colonization surveillance program. It is not intended to diagnose MRSA infection nor to guide or monitor  treatment for MRSA infections. Test performance is not FDA approved in patients less than 35 years old. Performed at [facility], [address], [place], [place] [address]          Radiology Studies: CT BONE MARROW BIOPSY & ASPIRATION  Result Date: 04/04/2023 CLINICAL DATA:  Unexplained  anemia and need for bone marrow biopsy. EXAM: CT GUIDED BONE MARROW ASPIRATION AND BIOPSY ANESTHESIA/SEDATION: Moderate (conscious) sedation was employed during this procedure. A total of Versed 1.0 mg and Fentanyl 75 mcg was administered intravenously by radiology nursing. Moderate Sedation Time: 13 minutes. The patient's level of consciousness and vital signs were monitored continuously by radiology nursing throughout the procedure under my direct supervision. PROCEDURE: The procedure risks, benefits, and alternatives were explained to the patient. Questions regarding the procedure were encouraged and answered. The patient understands and consents to the procedure. A time out was performed prior to initiating the procedure. The right gluteal region was prepped with chlorhexidine. Sterile gown and sterile gloves were used for the procedure. Local anesthesia was provided with 1% Lidocaine. Under CT guidance, an 11 gauge On Control bone cutting needle was advanced from a posterior approach into the right iliac bone. Needle positioning was confirmed with CT. Initial non heparinized and heparinized aspirate samples were obtained of bone marrow. Core biopsy was performed via the On Control drill needle. COMPLICATIONS: None FINDINGS: Inspection of initial aspirate did reveal visible particles. Intact core biopsy sample was obtained. IMPRESSION: CT guided bone marrow biopsy of right posterior iliac bone with both aspirate and core samples obtained. Electronically Signed   By: Irish Lack M.D.   On: 04/04/2023 10:45        Scheduled Meds:  atorvastatin  80 mg Oral Daily   Chlorhexidine Gluconate Cloth  6  each Topical Daily   dapagliflozin propanediol  10 mg Oral Daily   feeding supplement  1 Container Oral TID BM   furosemide  40 mg Intravenous BID   metoprolol tartrate  12.5 mg Oral BID   pantoprazole  40 mg Oral BID   polyethylene glycol  17 g Oral BID   potassium chloride SA  20 mEq Oral Daily   simethicone  80 mg Oral QID   Continuous Infusions:  sodium chloride       LOS: 5 days    Time spent: 40 minutes    Ramiro Harvest, MD [facility]   To contact the attending provider between 7A-7P or the covering provider during after hours 7P-7A, please log into the web site [email or web address] and access using universal [place] password for that web site. If you do not have the password, please call the hospital operator.  04/05/2023, 11:06 AM

## 2023-04-05 NOTE — Progress Notes (Signed)
Mobility Specialist Progress Note:   04/05/23 1000  Mobility  Activity Ambulated with assistance in hallway  Level of Assistance Modified independent, requires aide device or extra time  Assistive Device Crutches  Distance Ambulated (ft) 50 ft  Activity Response Tolerated well  Mobility Referral Yes  $Mobility charge 1 Mobility  Mobility Specialist Start Time (ACUTE ONLY) 1014  Mobility Specialist Stop Time (ACUTE ONLY) 1024  Mobility Specialist Time Calculation (min) (ACUTE ONLY) 10 min    Pt received ambulating in hallway, agreeable to mobility session. Asymptomatic throughout. Pt left ambulating in room. RN aware.  [facility] Earlene Plater Mobility Specialist Please contact via Special educational needs teacher or Rehab office at [phone]

## 2023-04-06 DIAGNOSIS — K922 Gastrointestinal hemorrhage, unspecified: Secondary | ICD-10-CM | POA: Diagnosis not present

## 2023-04-06 DIAGNOSIS — I4811 Longstanding persistent atrial fibrillation: Secondary | ICD-10-CM

## 2023-04-06 DIAGNOSIS — N179 Acute kidney failure, unspecified: Secondary | ICD-10-CM | POA: Diagnosis not present

## 2023-04-06 DIAGNOSIS — I5023 Acute on chronic systolic (congestive) heart failure: Secondary | ICD-10-CM | POA: Diagnosis not present

## 2023-04-06 LAB — HEMOGLOBIN AND HEMATOCRIT, BLOOD
HCT: 23.6 % — ABNORMAL LOW (ref 39.0–52.0)
Hemoglobin: 7.6 g/dL — ABNORMAL LOW (ref 13.0–17.0)

## 2023-04-06 MED ORDER — FUROSEMIDE 10 MG/ML IJ SOLN
60.0000 mg | Freq: Two times a day (BID) | INTRAMUSCULAR | Status: DC
  Administered 2023-04-06 – 2023-04-10 (×9): 60 mg via INTRAVENOUS
  Filled 2023-04-06 (×10): qty 6

## 2023-04-06 NOTE — Progress Notes (Signed)
Rounding Note    Patient Name: Leonard Cisneros Date of Encounter: 04/06/2023  [facility] Cardiologist: [name], MD   Subjective   No acute overnight events. He reports feeling well today and would like to go home. His weakness has improved. He denies any chest pain, shortness of breath, lightheadedness, dizziness. He continues to have significant lower extremity edema and his abdomen may be a little distended as well.  Inpatient Medications    Scheduled Meds:  atorvastatin  80 mg Oral Daily   bisacodyl  10 mg Rectal Once   Chlorhexidine Gluconate Cloth  6 each Topical Daily   dapagliflozin propanediol  10 mg Oral Daily   feeding supplement  1 Container Oral TID BM   furosemide  40 mg Intravenous BID   metoprolol tartrate  12.5 mg Oral BID   pantoprazole  40 mg Oral BID   polyethylene glycol  17 g Oral BID   potassium chloride SA  20 mEq Oral Daily   simethicone  80 mg Oral QID   Continuous Infusions:  sodium chloride     albumin human 25 g (04/05/23 2043)   PRN Meds: docusate sodium, melatonin, mouth rinse   Vital Signs    Vitals:   04/05/23 2316 04/06/23 0313 04/06/23 0500 04/06/23 0730  BP: 107/60 103/84  115/71  Pulse: 77 84 76   Resp: 20 19 20  (!) 21  Temp: 97.6 F (36.4 C) 97.6 F (36.4 C)  97.7 F (36.5 C)  TempSrc: Oral Oral  Oral  SpO2: 96% 97% 97% 97%  Weight:   88.7 kg   Height:        Intake/Output Summary (Last 24 hours) at 04/06/2023 0800 Last data filed at 04/05/2023 1807 Gross per 24 hour  Intake 445.71 ml  Output 200 ml  Net 245.71 ml      04/06/2023    5:00 AM 04/05/2023    3:19 AM 04/04/2023    3:20 AM  Last 3 Weights  Weight (lbs) 195 lb 8.8 oz 194 lb 3.6 oz 194 lb 14.2 oz  Weight (kg) 88.7 kg 88.1 kg 88.4 kg      Telemetry    Atrial fibrillation with baseline rates in the 80s to low 100s. Occasionally in the 120s. - Personally Reviewed  ECG    No new ECG tracing today. - Personally Reviewed  Physical Exam    GEN: Elderly Caucasian male in no acute distress.   Neck: JVD elevated with patient sitting upright in chair. Cardiac: Irregularly irregular rhythm with normal rate. II/VI murmur noted.   Respiratory: No increased work of breathing. Decreased breath sounds noted in right base. No significant wheezes, rhonchi, or rales appreciated. GI: Soft, mildly distended, and non-tender. MS: 3+ pitting edema of bilaterally lower extremities. Right lower extremity is weeping and is wrapped in gauze. Skin: Warm and dry. Neuro:  No focal deficits. Psych: Normal affect. Responds appropriately.  Labs    High Sensitivity Troponin:   Recent Labs  Lab 03/31/23 1004 03/31/23 1416 03/31/23 2133 03/31/23 2354 04/01/23 0542  TROPONINIHS 2,150* 1,742* 2,178* 2,438* 2,427*     Chemistry Recent Labs  Lab 03/31/23 1004 03/31/23 2135 04/04/23 0308 04/05/23 0153 04/06/23 0133  NA 130*   < > 132* 135 133*  K 4.1   < > 3.4* 3.8 3.7  CL 98   < > 98 102 97*  CO2 25   < > 26 26 27   GLUCOSE 115*   < > 88 97  103*  BUN 38*   < > 13 10 12   CREATININE 1.94*   < > 0.93 1.07 1.03  CALCIUM 7.8*   < > 7.8* 7.7* 8.1*  MG  --    < > 1.8 2.0 1.9  PROT 5.8*  --   --   --   --   ALBUMIN 2.5*  --   --   --   --   AST 67*  --   --   --   --   ALT 41  --   --   --   --   ALKPHOS 284*  --   --   --   --   BILITOT 3.5*  --   --   --   --   GFRNONAA 34*   < > >60 >60 >60  ANIONGAP 7   < > 8 7 9    < > = values in this interval not displayed.    Lipids No results for input(s): "CHOL", "TRIG", "HDL", "LABVLDL", "LDLCALC", "CHOLHDL" in the last 168 hours.  Hematology Recent Labs  Lab 04/04/23 0308 04/05/23 0153 04/06/23 0133  WBC 6.9 8.1 6.2  RBC 2.36* 2.50* 2.14*  HGB 8.3* 8.8* 7.6*  HCT 25.5* 27.1* 23.3*  MCV 108.1* 108.4* 108.9*  MCH 35.2* 35.2* 35.5*  MCHC 32.5 32.5 32.6  RDW 24.2* 23.7* 23.3*  PLT 180 206 177   Thyroid No results for input(s): "TSH", "FREET4" in the last 168 hours.  BNP Recent Labs   Lab 03/31/23 1004  BNP 1,121.0*    DDimer No results for input(s): "DDIMER" in the last 168 hours.   Radiology    CT BONE MARROW BIOPSY & ASPIRATION  Result Date: 04/04/2023 CLINICAL DATA:  Unexplained anemia and need for bone marrow biopsy. EXAM: CT GUIDED BONE MARROW ASPIRATION AND BIOPSY ANESTHESIA/SEDATION: Moderate (conscious) sedation was employed during this procedure. A total of Versed 1.0 mg and Fentanyl 75 mcg was administered intravenously by radiology nursing. Moderate Sedation Time: 13 minutes. The patient's level of consciousness and vital signs were monitored continuously by radiology nursing throughout the procedure under my direct supervision. PROCEDURE: The procedure risks, benefits, and alternatives were explained to the patient. Questions regarding the procedure were encouraged and answered. The patient understands and consents to the procedure. A time out was performed prior to initiating the procedure. The right gluteal region was prepped with chlorhexidine. Sterile gown and sterile gloves were used for the procedure. Local anesthesia was provided with 1% Lidocaine. Under CT guidance, an 11 gauge On Control bone cutting needle was advanced from a posterior approach into the right iliac bone. Needle positioning was confirmed with CT. Initial non heparinized and heparinized aspirate samples were obtained of bone marrow. Core biopsy was performed via the On Control drill needle. COMPLICATIONS: None FINDINGS: Inspection of initial aspirate did reveal visible particles. Intact core biopsy sample was obtained. IMPRESSION: CT guided bone marrow biopsy of right posterior iliac bone with both aspirate and core samples obtained. Electronically Signed   By: Irish Lack M.D.   On: 04/04/2023 10:45    Cardiac Studies   Right/ Left Cardiac Catheterization 09/27/2022:   Ost LAD to Prox LAD lesion is 100% stenosed.   Ramus lesion is 100% stenosed.   Prox RCA to Mid RCA lesion is 60%  stenosed.   Dist RCA lesion is 90% stenosed.   Origin to Prox Graft lesion is 100% stenosed.   LIMA graft was visualized by angiography and is large.  RIMA graft was visualized by non-selective angiography and is normal in caliber.   The graft exhibits no disease.   The graft exhibits no disease.   LV end diastolic pressure is mildly elevated.   Hemodynamic findings consistent with mild pulmonary hypertension.   There is moderate aortic valve stenosis.   3 vessel occlusive CAD. The native LCx is widely patent LIMA to the LAD is patent RIMA to the PDA is patent SVG to the ramus intermediate is occluded. This appears to be chronic Mild to Moderate aortic stenosis. AV mean gradient 18.5 mm Hg. AVA 2.5 cm squared with index 1.15. Elevated LV filling pressures. LVEDP 20 mm Hg. Mean PCWP 24 mm Hg with large V waves to 42 mm Hg Mild pulmonary HTN. PAP 56/17 with mean 31 mm Hg.    Plan: medical management. Continue diuresis. Would benefit greatly from correction of severe anemia. Will need to monitor Aortic stenosis serially going forward.   Diagnostic Dominance: Right  _______________  Echocardiogram 04/02/2023: Impressions: 1. Left ventricular ejection fraction, by estimation, is 30 to 35%. The  left ventricle has moderately decreased function. The left ventricle  demonstrates global hypokinesis. The left ventricular internal cavity size  was mildly dilated. There is mild  concentric left ventricular hypertrophy. Left ventricular diastolic  parameters are indeterminate.   2. Right ventricular systolic function is mildly reduced. The right  ventricular size is mildly enlarged. Tricuspid regurgitation signal is  inadequate for assessing PA pressure.   3. Left atrial size was severely dilated.   4. Right atrial size was moderately dilated.   5. The mitral valve is normal in structure. Mild mitral valve  regurgitation. Mild mitral stenosis. The mean mitral valve gradient is 6.0  mmHg.  Moderate mitral annular calcification.   6. The aortic valve is tricuspid. There is moderate calcification of the  aortic valve. Aortic valve regurgitation is not visualized. Mild aortic  valve stenosis. Aortic valve mean gradient measures 14.0 mmHg.   7. The inferior vena cava is dilated in size with <50% respiratory  variability, suggesting right atrial pressure of 15 mmHg.    Patient Profile     82 y.o. male with a history of CAD s/p remote CABG x3 in 2003 with subsequent BMS to SVG-RI in 2017 (last cath in 09/2022 showed 2/3 patent grafts with occluded SVG-RI), ischemic cardiomyopathy/ chronic HFpEF with EF of 55-60% on Echo in 09/2022, permanent atrial fibrillation and paroxysmal atrial flutter not on anticoagulation given GI bleeds and anemia, aortic stenosis, hypertension, hyperlipidemia, chronic venous stasis dermatitis, and GI bleeding with recurrent symptomatic anemia who presented on 03/31/2023 with weakness and was noted to be hypotension with a hemoglobin of 6.2. He was admitted for decompensated CHF and recurrent anemia. He was started on pressors and transfused with PRBC. Echo showed a new drop in EF to 30-35%. Cardiology was consulted for assistance.  Assessment & Plan    Acute on Chronic HFrEF Patient has a history of chronic HFpEF with EF of 55-60% on Echo in 09/2022. He presented with generalized and was found to be in decompensated CHF and severely anemic. BNP elevated at 1,121. Chest x-ray showed cardiomegaly with moderate pulmonary vascular congestion and bilateral effusions compatible with CHF. Echo showed LVEF of 30-35% with global hypokinesis and mild LVH, mildly reduced RV, biatrial enlargement (left > right), mild AS (mean gradient 14.0), and mild MR/ MS. Patient was started on IV Lasix. Documented 200 cc of urinary output yesterday and net negative 2.1 L this admission.  -  Still looks volume overloaded. - Increase IV Lasix to 60mg  twice daily  - GDMT limited due to  hypotension. Only on low dose Lopressor for rate control.  - Continue to monitor daily weights, strict I/Os, and renal function. - No evidence of an acute ischemic event to explain drop in EF. He is not a candidate for cardiac catheterization with his severe anemia.  Demand Ischemia CAD s/p CABG S/p remote CABG in 2003 with subsequent PCI with BMS to SVG-RI. Last cath in 09/2022 showed an occluded SVG to RI but patent LIMA to LAD and RIMA to PDA. Continued medical therapy recommended. High-sensitivity troponin peaked at 2,438 but this is felt to be due to demand ischemia from decompensate CHF and severe anemia. Echo showed a drop in EF to 30-35% with global hypokinesis.. - No chest pain. - No Aspirin given severe anemia. - Continue beta-blocker and high-intensity statin. - Given no chest pain and troponin consistent with demand ischemia, drop in EF not felt to be due to acute arterial graft failure. Also not a candidate for cardiac catheterization at this time.  Permanent Atrial Fibrillation Rates mostly in the 80s to low 100s. - Continue Lopressor 12.5mg  twice daily. - CHA2DS2-VASc = 5 (CAD, CHF, HTN, age x2). Not a candidate for anticoagulation with severe anemia and GI bleeding. He has had CTA to assess for Watchman and can continue to follow-up with Dr. Lalla Brothers as an outpatient.  Hypotension He was hypotensive on arrival and initially required Levophed in addition to blood transfusion. BP remains low-normal but is stable. - Continue low dose Lopressor as above.  Hyperlipidemia - Continue Lipitor 80mg  daily.  Acute on Chronic Anemia Hemoglobin 6.2 on admission. Baseline around 7.5. Hemoccult positive. Recent EGD on 02/15/2023 showed gastritis and a non-bleeding duodenal ulcer with a clean ulcer base. Recent colonoscopy on 03/13/2023 showed non-bleeding internal hemorrhoid and two 3-33mm polyps in the ascending colon and in the cecum which were removed with a cold snare. GI and Hematology  consulted and anemia not felt to be due to GI bleeding. Reticulocyte count has been low indicating a bone marrow production issue. S/p bone marrow biopsy on 8/1.  - Hemoglobin stable at 8.8. - Management per Internal Medicine and Hematology.  AKI on CKD Stage III Creatinine 1.94 on admission. Baseline around 0.9 to 1.2. Likely due to hypotension and decompensated CHF. Improved. - Creatinine stable at 1.07 today.  - Continue to monitor with diuresis.    For questions or updates, please contact [facility] Please consult [email or web address] for contact info under        Signed, Maurice Small, MD  04/06/2023, 8:00 AM

## 2023-04-06 NOTE — Progress Notes (Signed)
PROGRESS NOTE    COLA HIGHFILL  ZOX:[id number] DOB: Nov 12, 1940 DOA: 03/31/2023 PCP: [name], FNP    Chief Complaint  Patient presents with   Weakness    Brief Narrative:  82 year old man with increasing lethargy and dyspnea at home. Found to have hemoglobin of 6.2 in ED.  Transfusion started.  Hypotensive into the 80s requiring norepinephrine.  Transferred from [facility] to [facility] for ICU admission. Denies frank melena.  No vomiting. History of ischemic cardiomyopathy with triple-vessel disease.  Prior bypass 2003 . Medical management was recommended.  EF is normal.  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 Atrial fibrillation status post Watchman. History of chronic anemia with negative colonoscopy and EGD.  Ferritin has been normal in the past.  B12 and folate normal.     Assessment & Plan:   Principal Problem:   GIB (gastrointestinal bleeding) Active Problems:   History of duodenal ulcer   Anemia   AKI (acute kidney injury) (HCC)   Acute on chronic HFrEF (heart failure with reduced ejection fraction) (HCC)   Microcytic anemia   Demand ischemia   Atrial fibrillation (HCC)  #1 acute decompensated biventricular systolic heart failure -Patient noted to have required pressors of norepinephrine on admission with complaints of significant shortness of breath and noted to have a significant anemia. -Troponins obtained on admission elevated however seem to plateau at 2178>> 2438>>> 2427. -Patient status post transfusion of 2 units PRBC with hemoglobin currently stable at 8.8. -2D echo obtained during this hospitalization with a EF of 30 to 35%, left ventricular global hypokinesis, mild concentric LVH, severely dilated left atrial size, moderately dilated right atrial size, mild MVR, mild MS.  Mild aortic valve stenosis. -EF on recent 2D echo significantly reduced from prior EF of 55 to 60% from January 2024. -BP was soft however  improving. -Was on IV Lasix 40 mg every 12 hours, with urine output of 200 cc over the past 24 hours, however doubt accuracy as patient states has been using the bathroom and noted to have had significant urine output yesterday.  -BP soft, patient with significant volume overload on examination and as such received IV albumin twice daily x 4 doses in addition to IV Lasix.   -IV Lasix increased to 60 mg every 12 hours per cardiology today.  -Patient being followed by cardiology who are recommending continuation of diuresis with Lasix and Lopressor and no need for pressors/ionotropes at this time. -Per cardiology.  2.  CAD/CABG/demand ischemia -Patient noted to have cath in 09/27/2022 with patent RIMA/LIMA.  Patient denies any chest pain and at this point in time cardiology does not feel patient is a candidate for catheterization recommending medical management at this time. -Per cardiology.  3.  Severe symptomatic anemia -Patient presented with severe symptomatic anemia with shortness of breath, noted to have a hemoglobin of 6.2 on presentation. -Status post transfusion 2 units PRBCs. -Hemoglobin currently at 7.6.  -Repeat H&H this afternoon. -FOBT noted to be positive. -Patient with a macrocytic anemia. -Patient seen in consultation by GI and patient noted to have a recent EGD 02/15/2023 that showed mild gastritis and a clean-based 3 mm duodenal ulcer with biopsies negative for H. pylori. -Patient noted to have a colonoscopy 03/13/2023 which showed nonbleeding internal hemorrhoids and 2 small tubular adenomas which were removed. -Anemia panel not consistent with iron deficiency anemia, -Patient seen in consultation by hematology and bone marrow biopsy ordered which was done on 04/04/2023.  -Follow H&H. -Transfusion threshold hemoglobin <  8. -GI was following but have signed off as of 04/03/2023. -Per hematology/oncology.  4.  Permanent A-fib status post ablation -Currently rate controlled on  Lopressor. -Not a anticoagulation candidate due to recurrent anemias and concern for prior history of GI bleed. -Per cardiology.  5.  Hyperlipidemia -Statin.  6.  Demand ischemia -Felt likely secondary to anemia in the setting of history of CAD status post CABG. -Troponins on admission noted at 2150 seem to have plateaued and peaked at 2438. -Patient denies any angina/chest pain. -Keep hemoglobin > 8. -Continue current cardiac regimen of statin, beta-blocker, diuretics.  Aspirin on hold in the setting of possible GI bleed/anemia. -Could likely resume aspirin in about a week.  7.  Chronic venous stasis dermatitis -Continue current local wound care.  8.  AKI -Likely secondary to prerenal azotemia in the setting of decompensated CHF, presentation with hypotension. -Renal function improved with transfusion of PRBC and diuresis. -Urine output of 200 cc over the past 24 hours, however doubt accuracy as patient states he has been using the bathroom and peed a lot over the past 24 hours.. -Renal function improving daily. -Monitor renal function with diuresis. -Follow.  9.  Hypokalemia -Secondary to diuresis. -Repleted.   DVT prophylaxis: SCDs. Code Status: Full Family Communication: Updated patient.  No family at bedside.  Disposition: Likely home when clinically improved and euvolemic, labs stable, cleared by cardiology.  Status is: Inpatient Remains inpatient appropriate because: Severity of illness   Consultants:  IR: Alwyn Ren, NP 04/03/2023 Oncology: Dr. Mosetta Putt 04/02/2023 Cardiology: Dr. Excell Seltzer 04/02/2023 Gastroenterology: Dr. Leonides Schanz 04/01/2023  Procedures:  Transfusion 2 units PRBC 03/31/2023 Chest x-ray 03/31/2023 2D echo 04/02/2023 CT bone marrow biopsy and aspiration per IR: Dr. Fredia Sorrow 04/04/2023  Antimicrobials:  Anti-infectives (From admission, onward)    Start     Dose/Rate Route Frequency Ordered Stop   03/31/23 1000  cefTRIAXone (ROCEPHIN) 2 g in sodium  chloride 0.9 % 100 mL IVPB  Status:  Discontinued        2 g 200 mL/hr over 30 Minutes Intravenous Every 24 hours 03/31/23 0949 03/31/23 1530   03/31/23 1000  azithromycin (ZITHROMAX) 500 mg in sodium chloride 0.9 % 250 mL IVPB  Status:  Discontinued        500 mg 250 mL/hr over 60 Minutes Intravenous Every 24 hours 03/31/23 0949 03/31/23 1530         Subjective: Sitting up in chair.  Denies any chest pain or significant shortness of breath.  No bleeding.  Stated had a small bowel movement yesterday with no blood noted in it.  Fatigue tiredness improved.  No lightheadedness or dizziness.   Objective: Vitals:   04/05/23 2316 04/06/23 0313 04/06/23 0500 04/06/23 0730  BP: 107/60 103/84  115/71  Pulse: 77 84 76   Resp: 20 19 20  (!) 21  Temp: 97.6 F (36.4 C) 97.6 F (36.4 C)  97.7 F (36.5 C)  TempSrc: Oral Oral  Oral  SpO2: 96% 97% 97% 97%  Weight:   88.7 kg   Height:        Intake/Output Summary (Last 24 hours) at 04/06/2023 1104 Last data filed at 04/05/2023 1807 Gross per 24 hour  Intake 445.71 ml  Output 200 ml  Net 245.71 ml   Filed Weights   04/04/23 0320 04/05/23 0319 04/06/23 0500  Weight: 88.4 kg 88.1 kg 88.7 kg    Examination:  General exam: Appears calm and comfortable  Respiratory system: Bibasilar crackles.  No wheezing.  Fair air  movement.  Speaking in full sentences.  Cardiovascular system: Irregularly irregular. + JVD.  3+ bilateral lower extremity edema up to hips.  Lower extremities with chronic venous stasis changes.   Gastrointestinal system: Abdomen is soft, nontender, mildly distended, positive bowel sounds.  No rebound.  No guarding.  Central nervous system: Alert and oriented. No focal neurological deficits. Extremities: 3+ bilateral lower extremity edema up to hips, with chronic venous stasis changes noted.  Symmetric 5 x 5 power. Skin: No rashes, lesions or ulcers Psychiatry: Judgement and insight appear normal. Mood & affect appropriate.      Data Reviewed: I have personally reviewed following labs and imaging studies  CBC: Recent Labs  Lab 03/31/23 1004 03/31/23 1416 03/31/23 2133 04/02/23 0318 04/03/23 0330 04/04/23 0308 04/05/23 0153 04/06/23 0133  WBC 11.9* 12.3*   < > 7.6 7.0 6.9 8.1 6.2  NEUTROABS 9.2* 10.0*  --   --   --  4.4  --   --   HGB 6.2* 8.4*   < > 8.7* 8.6* 8.3* 8.8* 7.6*  HCT 19.3* 27.4*   < > 26.2* 26.5* 25.5* 27.1* 23.3*  MCV 116.3* 110.5*   < > 106.9* 107.7* 108.1* 108.4* 108.9*  PLT 191 199   < > 172 205 180 206 177   < > = values in this interval not displayed.    Basic Metabolic Panel: Recent Labs  Lab 03/31/23 2135 04/01/23 0207 04/02/23 0318 04/03/23 0330 04/04/23 0308 04/05/23 0153 04/06/23 0133  NA 135   < > 135 133* 132* 135 133*  K 3.9   < > 4.0 3.4* 3.4* 3.8 3.7  CL 101   < > 99 98 98 102 97*  CO2 23   < > 25 24 26 26 27   GLUCOSE 108*   < > 99 91 88 97 103*  BUN 34*   < > 23 17 13 10 12   CREATININE 1.74*   < > 1.33* 1.03 0.93 1.07 1.03  CALCIUM 7.7*   < > 8.0* 7.9* 7.8* 7.7* 8.1*  MG 1.8  --   --  1.7 1.8 2.0 1.9   < > = values in this interval not displayed.    GFR: Estimated Creatinine Clearance: 59.9 mL/min (by C-G formula based on SCr of 1.03 mg/dL).  Liver Function Tests: Recent Labs  Lab 03/31/23 1004  AST 67*  ALT 41  ALKPHOS 284*  BILITOT 3.5*  PROT 5.8*  ALBUMIN 2.5*    CBG: Recent Labs  Lab 03/31/23 1613  GLUCAP 94     Recent Results (from the past 240 hour(s))  Blood Culture (routine x 2)     Status: None   Collection Time: 03/31/23 10:04 AM   Specimen: Right Antecubital; Blood  Result Value Ref Range Status   Specimen Description   Final    RIGHT ANTECUBITAL BOTTLES DRAWN AEROBIC AND ANAEROBIC   Special Requests Blood Culture adequate volume  Final   Culture   Final    NO GROWTH 5 DAYS Performed at [facility], [address], [place], [place] [address]    Report Status 04/05/2023 FINAL  Final  Blood Culture (routine x 2)      Status: None   Collection Time: 03/31/23 10:04 AM   Specimen: Left Antecubital; Blood  Result Value Ref Range Status   Specimen Description   Final    LEFT ANTECUBITAL BOTTLES DRAWN AEROBIC AND ANAEROBIC   Special Requests   Final    Blood Culture results may not  be optimal due to an excessive volume of blood received in culture bottles   Culture   Final    NO GROWTH 5 DAYS Performed at [facility], [address] [place], [place] [address]    Report Status 04/05/2023 FINAL  Final  Resp panel by RT-PCR (RSV, Flu A&B, Covid) Anterior Nasal Swab     Status: None   Collection Time: 03/31/23 10:35 AM   Specimen: Anterior Nasal Swab  Result Value Ref Range Status   SARS Coronavirus 2 by RT PCR NEGATIVE NEGATIVE Final    Comment: (NOTE) SARS-CoV-2 target nucleic acids are NOT DETECTED.  The SARS-CoV-2 RNA is generally detectable in upper respiratory specimens during the acute phase of infection. The lowest concentration of SARS-CoV-2 viral copies this assay can detect is 138 copies/mL. A negative result does not preclude SARS-Cov-2 infection and should not be used as the sole basis for treatment or other patient management decisions. A negative result may occur with  improper specimen collection/handling, submission of specimen other than nasopharyngeal swab, presence of viral mutation(s) within the areas targeted by this assay, and inadequate number of viral copies(<138 copies/mL). A negative result must be combined with clinical observations, patient history, and epidemiological information. The expected result is Negative.  Fact Sheet for Patients:  BloggerCourse.com  Fact Sheet for Healthcare Providers:  SeriousBroker.it  This test is no t yet approved or cleared by the [place] FDA and  has been authorized for detection and/or diagnosis of SARS-CoV-2 by FDA under an Emergency Use Authorization (EUA). This EUA will remain   in effect (meaning this test can be used) for the duration of the COVID-19 declaration under Section 564(b)(1) of the Act, 21 U.S.C.section 360bbb-3(b)(1), unless the authorization is terminated  or revoked sooner.       Influenza A by PCR NEGATIVE NEGATIVE Final   Influenza B by PCR NEGATIVE NEGATIVE Final    Comment: (NOTE) The Xpert Xpress SARS-CoV-2/FLU/RSV plus assay is intended as an aid in the diagnosis of influenza from Nasopharyngeal swab specimens and should not be used as a sole basis for treatment. Nasal washings and aspirates are unacceptable for Xpert Xpress SARS-CoV-2/FLU/RSV testing.  Fact Sheet for Patients: BloggerCourse.com  Fact Sheet for Healthcare Providers: SeriousBroker.it  This test is not yet approved or cleared by the [place] FDA and has been authorized for detection and/or diagnosis of SARS-CoV-2 by FDA under an Emergency Use Authorization (EUA). This EUA will remain in effect (meaning this test can be used) for the duration of the COVID-19 declaration under Section 564(b)(1) of the Act, 21 U.S.C. section 360bbb-3(b)(1), unless the authorization is terminated or revoked.     Resp Syncytial Virus by PCR NEGATIVE NEGATIVE Final    Comment: (NOTE) Fact Sheet for Patients: BloggerCourse.com  Fact Sheet for Healthcare Providers: SeriousBroker.it  This test is not yet approved or cleared by the [place] FDA and has been authorized for detection and/or diagnosis of SARS-CoV-2 by FDA under an Emergency Use Authorization (EUA). This EUA will remain in effect (meaning this test can be used) for the duration of the COVID-19 declaration under Section 564(b)(1) of the Act, 21 U.S.C. section 360bbb-3(b)(1), unless the authorization is terminated or revoked.  Performed at [facility], [address], [place], [place] [address]   MRSA Next Gen by  PCR, Nasal     Status: None   Collection Time: 03/31/23  4:22 PM   Specimen: Nasal Mucosa; Nasal Swab  Result Value Ref Range Status  MRSA by PCR Next Gen NOT DETECTED NOT DETECTED Final    Comment: (NOTE) The GeneXpert MRSA Assay (FDA approved for NASAL specimens only), is one component of a comprehensive MRSA colonization surveillance program. It is not intended to diagnose MRSA infection nor to guide or monitor treatment for MRSA infections. Test performance is not FDA approved in patients less than 32 years old. Performed at [facility], [address] [place], [place] [address]          Radiology Studies: No results found.      Scheduled Meds:  atorvastatin  80 mg Oral Daily   bisacodyl  10 mg Rectal Once   Chlorhexidine Gluconate Cloth  6 each Topical Daily   dapagliflozin propanediol  10 mg Oral Daily   feeding supplement  1 Container Oral TID BM   furosemide  60 mg Intravenous BID   metoprolol tartrate  12.5 mg Oral BID   pantoprazole  40 mg Oral BID   polyethylene glycol  17 g Oral BID   potassium chloride SA  20 mEq Oral Daily   simethicone  80 mg Oral QID   Continuous Infusions:  sodium chloride     albumin human 25 g (04/06/23 0936)     LOS: 6 days    Time spent: 35 minutes    Ramiro Harvest, MD [facility]   To contact the attending provider between 7A-7P or the covering provider during after hours 7P-7A, please log into the web site [email or web address] and access using universal [place] password for that web site. If you do not have the password, please call the hospital operator.  04/06/2023, 11:04 AM

## 2023-04-06 NOTE — Plan of Care (Signed)

## 2023-04-07 DIAGNOSIS — N179 Acute kidney failure, unspecified: Secondary | ICD-10-CM | POA: Diagnosis not present

## 2023-04-07 DIAGNOSIS — I2489 Other forms of acute ischemic heart disease: Secondary | ICD-10-CM | POA: Diagnosis not present

## 2023-04-07 DIAGNOSIS — K922 Gastrointestinal hemorrhage, unspecified: Secondary | ICD-10-CM | POA: Diagnosis not present

## 2023-04-07 DIAGNOSIS — I4811 Longstanding persistent atrial fibrillation: Secondary | ICD-10-CM | POA: Diagnosis not present

## 2023-04-07 DIAGNOSIS — I5023 Acute on chronic systolic (congestive) heart failure: Secondary | ICD-10-CM | POA: Diagnosis not present

## 2023-04-07 LAB — TYPE AND SCREEN
ABO/RH(D): O POS
Antibody Screen: POSITIVE

## 2023-04-07 LAB — CBC
HCT: 22.7 % — ABNORMAL LOW (ref 39.0–52.0)
Hemoglobin: 7.3 g/dL — ABNORMAL LOW (ref 13.0–17.0)
MCH: 34.6 pg — ABNORMAL HIGH (ref 26.0–34.0)
MCHC: 32.2 g/dL (ref 30.0–36.0)
MCV: 107.6 fL — ABNORMAL HIGH (ref 80.0–100.0)
Platelets: 168 10*3/uL (ref 150–400)
RBC: 2.11 MIL/uL — ABNORMAL LOW (ref 4.22–5.81)
RDW: 23.3 % — ABNORMAL HIGH (ref 11.5–15.5)
WBC: 6.5 10*3/uL (ref 4.0–10.5)
nRBC: 0 % (ref 0.0–0.2)

## 2023-04-07 LAB — MAGNESIUM: Magnesium: 1.8 mg/dL (ref 1.7–2.4)

## 2023-04-07 LAB — BASIC METABOLIC PANEL WITH GFR
Anion gap: 7 (ref 5–15)
BUN: 14 mg/dL (ref 8–23)
CO2: 28 mmol/L (ref 22–32)
Calcium: 8.4 mg/dL — ABNORMAL LOW (ref 8.9–10.3)
Chloride: 100 mmol/L (ref 98–111)
Creatinine, Ser: 1.1 mg/dL (ref 0.61–1.24)
GFR, Estimated: >60 mL/min (ref >60)
Glucose, Bld: 96 mg/dL (ref 70–99)
Potassium: 4 mmol/L (ref 3.5–5.1)
Sodium: 135 mmol/L (ref 135–145)

## 2023-04-07 MED ORDER — FUROSEMIDE 10 MG/ML IJ SOLN: 20.0000 mg | Freq: Once | INTRAMUSCULAR | Status: DC

## 2023-04-07 MED ORDER — DIPHENHYDRAMINE HCL 25 MG PO CAPS
25.0000 mg | ORAL_CAPSULE | Freq: Once | ORAL | Status: AC
  Administered 2023-04-08: 25 mg via ORAL
  Filled 2023-04-07 (×2): qty 1

## 2023-04-07 MED ORDER — MAGNESIUM SULFATE 2 GM/50ML IV SOLN
2.0000 g | Freq: Once | INTRAVENOUS | Status: AC
  Administered 2023-04-07: 2 g via INTRAVENOUS
  Filled 2023-04-07: qty 50

## 2023-04-07 MED ORDER — SODIUM CHLORIDE 0.9% IV SOLUTION: Freq: Once | INTRAVENOUS | Status: DC

## 2023-04-07 MED ORDER — FUROSEMIDE 10 MG/ML IJ SOLN
20.0000 mg | Freq: Once | INTRAMUSCULAR | Status: AC
  Administered 2023-04-07: 20 mg via INTRAVENOUS
  Filled 2023-04-07: qty 2

## 2023-04-07 MED ORDER — ACETAMINOPHEN 325 MG PO TABS
650.0000 mg | ORAL_TABLET | Freq: Once | ORAL | Status: AC
  Administered 2023-04-07: 650 mg via ORAL
  Filled 2023-04-07: qty 2

## 2023-04-07 MED ORDER — BISACODYL 10 MG RE SUPP
10.0000 mg | Freq: Once | RECTAL | Status: AC
  Administered 2023-04-07: 10 mg via RECTAL

## 2023-04-07 NOTE — Progress Notes (Signed)
Order received this morning to transfuse 1U of RBCs. Called blood bank x2, blood is not ready for pt, due to compatibilities issue. Will need to send blood out to a different facility. Blood Bank will keep RN updated when blood is available for administration.

## 2023-04-07 NOTE — Progress Notes (Signed)
PROGRESS NOTE    Leonard Cisneros  ZOX:[id number] DOB: 1941/08/27 DOA: 03/31/2023 PCP: [name], FNP    Chief Complaint  Patient presents with   Weakness    Brief Narrative:  82 year old man with increasing lethargy and dyspnea at home. Found to have hemoglobin of 6.2 in ED.  Transfusion started.  Hypotensive into the 80s requiring norepinephrine.  Transferred from [facility] to [facility] for ICU admission. Denies frank melena.  No vomiting. History of ischemic cardiomyopathy with triple-vessel disease.  Prior bypass 2003 . Medical management was recommended.  EF is normal.  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 Atrial fibrillation status post Watchman. History of chronic anemia with negative colonoscopy and EGD.  Ferritin has been normal in the past.  B12 and folate normal.     Assessment & Plan:   Principal Problem:   GIB (gastrointestinal bleeding) Active Problems:   History of duodenal ulcer   Anemia   AKI (acute kidney injury) (HCC)   Acute on chronic HFrEF (heart failure with reduced ejection fraction) (HCC)   Microcytic anemia   Demand ischemia   Atrial fibrillation (HCC)  #1 acute decompensated biventricular systolic heart failure -Patient noted to have required pressors of norepinephrine on admission with complaints of significant shortness of breath and noted to have a significant anemia. -Troponins obtained on admission elevated however seem to plateau at 2178>> 2438>>> 2427. -Patient status post transfusion of 2 units PRBC with hemoglobin currently stable at 8.8. -2D echo obtained during this hospitalization with a EF of 30 to 35%, left ventricular global hypokinesis, mild concentric LVH, severely dilated left atrial size, moderately dilated right atrial size, mild MVR, mild MS.  Mild aortic valve stenosis. -EF on recent 2D echo significantly reduced from prior EF of 55 to 60% from January 2024. -BP was soft however  improving. -Was on IV Lasix 40 mg every 12 hours, and dose adjusted to 60 mg IV every 12 hours per cardiology as patient still with significant volume overload on examination. -BP soft, patient with significant volume overload on examination and as such received IV albumin twice daily x 4 doses in addition to IV Lasix.   -IV Lasix increased to 60 mg every 12 hours per cardiology.  -Patient being followed by cardiology who are recommending continuation of diuresis with Lasix and Lopressor and no need for pressors/ionotropes at this time. -Per cardiology.  2.  CAD/CABG/demand ischemia -Patient noted to have cath in 09/27/2022 with patent RIMA/LIMA.  Patient denies any chest pain and at this point in time cardiology does not feel patient is a candidate for catheterization recommending medical management at this time. -Transfuse 1 unit PRBCs to keep hemoglobin > 8.  -Per cardiology.  3.  Severe symptomatic anemia -Patient presented with severe symptomatic anemia with shortness of breath, noted to have a hemoglobin of 6.2 on presentation. -Status post transfusion 2 units PRBCs. -Hemoglobin currently at 7.3. -FOBT noted to be positive. -Patient with a macrocytic anemia. -Patient seen in consultation by GI and patient noted to have a recent EGD 02/15/2023 that showed mild gastritis and a clean-based 3 mm duodenal ulcer with biopsies negative for H. pylori. -Patient noted to have a colonoscopy 03/13/2023 which showed nonbleeding internal hemorrhoids and 2 small tubular adenomas which were removed. -Anemia panel not consistent with iron deficiency anemia, -Patient seen in consultation by hematology and bone marrow biopsy ordered which was done on 04/04/2023.  -Transfuse 1 unit PRBCs to keep hemoglobin > 8.  -Follow H&H. -  GI was following but have signed off as of 04/03/2023. -Per hematology/oncology.  4.  Permanent A-fib status post ablation -Currently rate controlled on Lopressor. -Not a  anticoagulation candidate due to recurrent anemias and concern for prior history of GI bleed. -Per cardiology.  5.  Hyperlipidemia -Continue statin.   6.  Demand ischemia -Felt likely secondary to anemia in the setting of history of CAD status post CABG. -Troponins on admission noted at 2150 seem to have plateaued and peaked at 2438. -Patient denies any angina/chest pain. -Keep hemoglobin > 8. -Continue current cardiac regimen of statin, beta-blocker, diuretics.  Aspirin on hold in the setting of possible GI bleed/anemia. -Could likely resume aspirin in about a week if okay with cardiology and hematology.  7.  Chronic venous stasis dermatitis -Continue current local wound care.  8.  AKI -Likely secondary to prerenal azotemia in the setting of decompensated CHF, presentation with hypotension. -Renal function improved with transfusion of PRBC and diuresis. -Urine output of 950 cc over the past 24 hours.  Also unmeasured urine noted.  -Renal function improving daily. -Monitor renal function with diuresis. -Follow.  9.  Hypokalemia -Secondary to diuresis. -Repleted. -Potassium of 4.0.   DVT prophylaxis: SCDs. Code Status: Full Family Communication: Updated patient.  No family at bedside.  Disposition: Likely home when clinically improved and euvolemic, labs stable, cleared by cardiology.  Status is: Inpatient Remains inpatient appropriate because: Severity of illness   Consultants:  IR: Alwyn Ren, NP 04/03/2023 Oncology: Dr. Mosetta Putt 04/02/2023 Cardiology: Dr. Excell Seltzer 04/02/2023 Gastroenterology: Dr. Leonides Schanz 04/01/2023  Procedures:  Transfusion 2 units PRBC 03/31/2023 Chest x-ray 03/31/2023 2D echo 04/02/2023 CT bone marrow biopsy and aspiration per IR: Dr. Fredia Sorrow 04/04/2023 Transfusion 1 unit PRBCs pending 04/07/2023  Antimicrobials:  Anti-infectives (From admission, onward)    Start     Dose/Rate Route Frequency Ordered Stop   03/31/23 1000  cefTRIAXone (ROCEPHIN) 2 g in  sodium chloride 0.9 % 100 mL IVPB  Status:  Discontinued        2 g 200 mL/hr over 30 Minutes Intravenous Every 24 hours 03/31/23 0949 03/31/23 1530   03/31/23 1000  azithromycin (ZITHROMAX) 500 mg in sodium chloride 0.9 % 250 mL IVPB  Status:  Discontinued        500 mg 250 mL/hr over 60 Minutes Intravenous Every 24 hours 03/31/23 0949 03/31/23 1530         Subjective: Sitting up in recliner.  He denies any significant chest pain.  No significant shortness of breath.  Stated had a bowel movement yesterday.  Denies any bleeding.  No lightheadedness or dizziness.    Objective: Vitals:   04/07/23 0315 04/07/23 0500 04/07/23 0742 04/07/23 0757  BP: 116/64  (!) 145/131 (!) 96/56  Pulse: 87     Resp: 20 19 16  (!) 22  Temp: (!) 97.5 F (36.4 C)  (!) 97.4 F (36.3 C) (!) 97.5 F (36.4 C)  TempSrc: Oral  Oral Oral  SpO2: 94%   97%  Weight:  89.2 kg    Height:        Intake/Output Summary (Last 24 hours) at 04/07/2023 1115 Last data filed at 04/07/2023 8295 Gross per 24 hour  Intake 720 ml  Output 1475 ml  Net -755 ml   Filed Weights   04/05/23 0319 04/06/23 0500 04/07/23 0500  Weight: 88.1 kg 88.7 kg 89.2 kg    Examination:  General exam: Appears calm and comfortable  Respiratory system: Bibasilar crackles.  No wheezing.  Fair air  movement.  Speaking in full sentences.  Cardiovascular system: Irregularly irregular.  Positive JVD.  3+ bilateral lower extremity edema up to hips.  Lower extremities with chronic venous stasis changes.  Gastrointestinal system: Abdomen is soft, nontender, mildly distended, positive bowel sounds.  No rebound.  No guarding.  Central nervous system: Alert and oriented. No focal neurological deficits. Extremities: 3+ bilateral lower extremity edema up to hips, with chronic venous stasis changes noted.  Symmetric 5 x 5 power. Skin: No rashes, lesions or ulcers Psychiatry: Judgement and insight appear normal. Mood & affect appropriate.     Data  Reviewed: I have personally reviewed following labs and imaging studies  CBC: Recent Labs  Lab 03/31/23 1416 03/31/23 2133 04/03/23 0330 04/04/23 0308 04/05/23 0153 04/06/23 0133 04/06/23 1547 04/07/23 0210  WBC 12.3*   < > 7.0 6.9 8.1 6.2  --  6.5  NEUTROABS 10.0*  --   --  4.4  --   --   --   --   HGB 8.4*   < > 8.6* 8.3* 8.8* 7.6* 7.6* 7.3*  HCT 27.4*   < > 26.5* 25.5* 27.1* 23.3* 23.6* 22.7*  MCV 110.5*   < > 107.7* 108.1* 108.4* 108.9*  --  107.6*  PLT 199   < > 205 180 206 177  --  168   < > = values in this interval not displayed.    Basic Metabolic Panel: Recent Labs  Lab 04/03/23 0330 04/04/23 0308 04/05/23 0153 04/06/23 0133 04/07/23 0210  NA 133* 132* 135 133* 135  K 3.4* 3.4* 3.8 3.7 4.0  CL 98 98 102 97* 100  CO2 24 26 26 27 28   GLUCOSE 91 88 97 103* 96  BUN 17 13 10 12 14   CREATININE 1.03 0.93 1.07 1.03 1.10  CALCIUM 7.9* 7.8* 7.7* 8.1* 8.4*  MG 1.7 1.8 2.0 1.9 1.8    GFR: Estimated Creatinine Clearance: 56.1 mL/min (by C-G formula based on SCr of 1.1 mg/dL).  Liver Function Tests: No results for input(s): "AST", "ALT", "ALKPHOS", "BILITOT", "PROT", "ALBUMIN" in the last 168 hours.   CBG: Recent Labs  Lab 03/31/23 1613  GLUCAP 94     Recent Results (from the past 240 hour(s))  Blood Culture (routine x 2)     Status: None   Collection Time: 03/31/23 10:04 AM   Specimen: Right Antecubital; Blood  Result Value Ref Range Status   Specimen Description   Final    RIGHT ANTECUBITAL BOTTLES DRAWN AEROBIC AND ANAEROBIC   Special Requests Blood Culture adequate volume  Final   Culture   Final    NO GROWTH 5 DAYS Performed at [facility], [address] [place], [place] [address]    Report Status 04/05/2023 FINAL  Final  Blood Culture (routine x 2)     Status: None   Collection Time: 03/31/23 10:04 AM   Specimen: Left Antecubital; Blood  Result Value Ref Range Status   Specimen Description   Final    LEFT ANTECUBITAL BOTTLES DRAWN AEROBIC  AND ANAEROBIC   Special Requests   Final    Blood Culture results may not be optimal due to an excessive volume of blood received in culture bottles   Culture   Final    NO GROWTH 5 DAYS Performed at [facility], [address] [place], [place] [address]    Report Status 04/05/2023 FINAL  Final  Resp panel by RT-PCR (RSV, Flu A&B, Covid) Anterior Nasal Swab     Status:  None   Collection Time: 03/31/23 10:35 AM   Specimen: Anterior Nasal Swab  Result Value Ref Range Status   SARS Coronavirus 2 by RT PCR NEGATIVE NEGATIVE Final    Comment: (NOTE) SARS-CoV-2 target nucleic acids are NOT DETECTED.  The SARS-CoV-2 RNA is generally detectable in upper respiratory specimens during the acute phase of infection. The lowest concentration of SARS-CoV-2 viral copies this assay can detect is 138 copies/mL. A negative result does not preclude SARS-Cov-2 infection and should not be used as the sole basis for treatment or other patient management decisions. A negative result may occur with  improper specimen collection/handling, submission of specimen other than nasopharyngeal swab, presence of viral mutation(s) within the areas targeted by this assay, and inadequate number of viral copies(<138 copies/mL). A negative result must be combined with clinical observations, patient history, and epidemiological information. The expected result is Negative.  Fact Sheet for Patients:  BloggerCourse.com  Fact Sheet for Healthcare Providers:  SeriousBroker.it  This test is no t yet approved or cleared by the [place] FDA and  has been authorized for detection and/or diagnosis of SARS-CoV-2 by FDA under an Emergency Use Authorization (EUA). This EUA will remain  in effect (meaning this test can be used) for the duration of the COVID-19 declaration under Section 564(b)(1) of the Act, 21 U.S.C.section 360bbb-3(b)(1), unless the authorization is  terminated  or revoked sooner.       Influenza A by PCR NEGATIVE NEGATIVE Final   Influenza B by PCR NEGATIVE NEGATIVE Final    Comment: (NOTE) The Xpert Xpress SARS-CoV-2/FLU/RSV plus assay is intended as an aid in the diagnosis of influenza from Nasopharyngeal swab specimens and should not be used as a sole basis for treatment. Nasal washings and aspirates are unacceptable for Xpert Xpress SARS-CoV-2/FLU/RSV testing.  Fact Sheet for Patients: BloggerCourse.com  Fact Sheet for Healthcare Providers: SeriousBroker.it  This test is not yet approved or cleared by the [place] FDA and has been authorized for detection and/or diagnosis of SARS-CoV-2 by FDA under an Emergency Use Authorization (EUA). This EUA will remain in effect (meaning this test can be used) for the duration of the COVID-19 declaration under Section 564(b)(1) of the Act, 21 U.S.C. section 360bbb-3(b)(1), unless the authorization is terminated or revoked.     Resp Syncytial Virus by PCR NEGATIVE NEGATIVE Final    Comment: (NOTE) Fact Sheet for Patients: BloggerCourse.com  Fact Sheet for Healthcare Providers: SeriousBroker.it  This test is not yet approved or cleared by the [place] FDA and has been authorized for detection and/or diagnosis of SARS-CoV-2 by FDA under an Emergency Use Authorization (EUA). This EUA will remain in effect (meaning this test can be used) for the duration of the COVID-19 declaration under Section 564(b)(1) of the Act, 21 U.S.C. section 360bbb-3(b)(1), unless the authorization is terminated or revoked.  Performed at [facility], [address] [place], [place] [address]   MRSA Next Gen by PCR, Nasal     Status: None   Collection Time: 03/31/23  4:22 PM   Specimen: Nasal Mucosa; Nasal Swab  Result Value Ref Range Status   MRSA by PCR Next Gen NOT DETECTED NOT DETECTED  Final    Comment: (NOTE) The GeneXpert MRSA Assay (FDA approved for NASAL specimens only), is one component of a comprehensive MRSA colonization surveillance program. It is not intended to diagnose MRSA infection nor to guide or monitor treatment for MRSA infections. Test performance is not FDA approved in patients less than 2  years old. Performed at [facility], [address] [place], [place] [address]          Radiology Studies: No results found.      Scheduled Meds:  sodium chloride   Intravenous Once   atorvastatin  80 mg Oral Daily   bisacodyl  10 mg Rectal Once   Chlorhexidine Gluconate Cloth  6 each Topical Daily   dapagliflozin propanediol  10 mg Oral Daily   diphenhydrAMINE  25 mg Oral Once   feeding supplement  1 Container Oral TID BM   furosemide  20 mg Intravenous Once   furosemide  60 mg Intravenous BID   metoprolol tartrate  12.5 mg Oral BID   pantoprazole  40 mg Oral BID   polyethylene glycol  17 g Oral BID   potassium chloride SA  20 mEq Oral Daily   simethicone  80 mg Oral QID   Continuous Infusions:  sodium chloride       LOS: 7 days    Time spent: 35 minutes    Ramiro Harvest, MD [facility]   To contact the attending provider between 7A-7P or the covering provider during after hours 7P-7A, please log into the web site [email or web address] and access using universal [place] password for that web site. If you do not have the password, please call the hospital operator.  04/07/2023, 11:15 AM

## 2023-04-07 NOTE — Progress Notes (Signed)
Rounding Note    Patient Name: Leonard Cisneros Date of Encounter: 04/07/2023  [facility] Cardiologist: [name], MD   Subjective   No acute overnight events. He reports feeling well today and would like to go home. His weakness has improved. He denies any chest pain, shortness of breath, lightheadedness, dizziness. He continues to have significant lower extremity edema, JVD, and his abdomen may be a little distended as well.  Inpatient Medications    Scheduled Meds:  atorvastatin  80 mg Oral Daily   bisacodyl  10 mg Rectal Once   Chlorhexidine Gluconate Cloth  6 each Topical Daily   dapagliflozin propanediol  10 mg Oral Daily   feeding supplement  1 Container Oral TID BM   furosemide  60 mg Intravenous BID   metoprolol tartrate  12.5 mg Oral BID   pantoprazole  40 mg Oral BID   polyethylene glycol  17 g Oral BID   potassium chloride SA  20 mEq Oral Daily   simethicone  80 mg Oral QID   Continuous Infusions:  sodium chloride     PRN Meds: docusate sodium, melatonin, mouth rinse   Vital Signs    Vitals:   04/06/23 2354 04/07/23 0315 04/07/23 0500 04/07/23 0742  BP: 112/65 116/64  (!) 145/131  Pulse: 81 87    Resp: 20 20 19 16   Temp: 98 F (36.7 C) (!) 97.5 F (36.4 C)  (!) 97.4 F (36.3 C)  TempSrc: Oral Oral  Oral  SpO2: 97% 94%    Weight:   89.2 kg   Height:        Intake/Output Summary (Last 24 hours) at 04/07/2023 0747 Last data filed at 04/07/2023 0747 Gross per 24 hour  Intake 600 ml  Output 1625 ml  Net -1025 ml      04/07/2023    5:00 AM 04/06/2023    5:00 AM 04/05/2023    3:19 AM  Last 3 Weights  Weight (lbs) 196 lb 10.4 oz 195 lb 8.8 oz 194 lb 3.6 oz  Weight (kg) 89.2 kg 88.7 kg 88.1 kg      Telemetry    Atrial fibrillation with baseline rates in the 80s to low 100s. Occasionally in the 120s. - Personally Reviewed  ECG    No new ECG tracing today. - Personally Reviewed  Physical Exam   GEN: Elderly Caucasian male in no  acute distress.   Neck: JVD elevated with patient sitting upright in chair. Cardiac: Irregularly irregular rhythm with normal rate. II/VI murmur noted.   Respiratory: No increased work of breathing. Decreased breath sounds noted in right base. No significant wheezes, rhonchi, or rales appreciated. GI: Soft, mildly distended, and non-tender. MS: 3+ pitting edema of bilaterally lower extremities. Right lower extremity is weeping and is wrapped in gauze. Skin: Warm and dry. Neuro:  No focal deficits. Psych: Normal affect. Responds appropriately.  Labs    High Sensitivity Troponin:   Recent Labs  Lab 03/31/23 1004 03/31/23 1416 03/31/23 2133 03/31/23 2354 04/01/23 0542  TROPONINIHS 2,150* 1,742* 2,178* 2,438* 2,427*     Chemistry Recent Labs  Lab 03/31/23 1004 03/31/23 2135 04/05/23 0153 04/06/23 0133 04/07/23 0210  NA 130*   < > 135 133* 135  K 4.1   < > 3.8 3.7 4.0  CL 98   < > 102 97* 100  CO2 25   < > 26 27 28   GLUCOSE 115*   < > 97 103* 96  BUN 38*   < >  10 12 14   CREATININE 1.94*   < > 1.07 1.03 1.10  CALCIUM 7.8*   < > 7.7* 8.1* 8.4*  MG  --    < > 2.0 1.9 1.8  PROT 5.8*  --   --   --   --   ALBUMIN 2.5*  --   --   --   --   AST 67*  --   --   --   --   ALT 41  --   --   --   --   ALKPHOS 284*  --   --   --   --   BILITOT 3.5*  --   --   --   --   GFRNONAA 34*   < > >60 >60 >60  ANIONGAP 7   < > 7 9 7    < > = values in this interval not displayed.    Lipids No results for input(s): "CHOL", "TRIG", "HDL", "LABVLDL", "LDLCALC", "CHOLHDL" in the last 168 hours.  Hematology Recent Labs  Lab 04/05/23 0153 04/06/23 0133 04/06/23 1547 04/07/23 0210  WBC 8.1 6.2  --  6.5  RBC 2.50* 2.14*  --  2.11*  HGB 8.8* 7.6* 7.6* 7.3*  HCT 27.1* 23.3* 23.6* 22.7*  MCV 108.4* 108.9*  --  107.6*  MCH 35.2* 35.5*  --  34.6*  MCHC 32.5 32.6  --  32.2  RDW 23.7* 23.3*  --  23.3*  PLT 206 177  --  168   Thyroid No results for input(s): "TSH", "FREET4" in the last 168  hours.  BNP Recent Labs  Lab 03/31/23 1004  BNP 1,121.0*    DDimer No results for input(s): "DDIMER" in the last 168 hours.   Radiology    No results found.  Cardiac Studies   Right/ Left Cardiac Catheterization 09/27/2022:   Ost LAD to Prox LAD lesion is 100% stenosed.   Ramus lesion is 100% stenosed.   Prox RCA to Mid RCA lesion is 60% stenosed.   Dist RCA lesion is 90% stenosed.   Origin to Prox Graft lesion is 100% stenosed.   LIMA graft was visualized by angiography and is large.   RIMA graft was visualized by non-selective angiography and is normal in caliber.   The graft exhibits no disease.   The graft exhibits no disease.   LV end diastolic pressure is mildly elevated.   Hemodynamic findings consistent with mild pulmonary hypertension.   There is moderate aortic valve stenosis.   3 vessel occlusive CAD. The native LCx is widely patent LIMA to the LAD is patent RIMA to the PDA is patent SVG to the ramus intermediate is occluded. This appears to be chronic Mild to Moderate aortic stenosis. AV mean gradient 18.5 mm Hg. AVA 2.5 cm squared with index 1.15. Elevated LV filling pressures. LVEDP 20 mm Hg. Mean PCWP 24 mm Hg with large V waves to 42 mm Hg Mild pulmonary HTN. PAP 56/17 with mean 31 mm Hg.    Plan: medical management. Continue diuresis. Would benefit greatly from correction of severe anemia. Will need to monitor Aortic stenosis serially going forward.   Diagnostic Dominance: Right  _______________  Echocardiogram 04/02/2023: Impressions: 1. Left ventricular ejection fraction, by estimation, is 30 to 35%. The  left ventricle has moderately decreased function. The left ventricle  demonstrates global hypokinesis. The left ventricular internal cavity size  was mildly dilated. There is mild  concentric left ventricular hypertrophy. Left ventricular diastolic  parameters are indeterminate.  2. Right ventricular systolic function is mildly reduced. The  right  ventricular size is mildly enlarged. Tricuspid regurgitation signal is  inadequate for assessing PA pressure.   3. Left atrial size was severely dilated.   4. Right atrial size was moderately dilated.   5. The mitral valve is normal in structure. Mild mitral valve  regurgitation. Mild mitral stenosis. The mean mitral valve gradient is 6.0  mmHg. Moderate mitral annular calcification.   6. The aortic valve is tricuspid. There is moderate calcification of the  aortic valve. Aortic valve regurgitation is not visualized. Mild aortic  valve stenosis. Aortic valve mean gradient measures 14.0 mmHg.   7. The inferior vena cava is dilated in size with <50% respiratory  variability, suggesting right atrial pressure of 15 mmHg.    Patient Profile     82 y.o. male with a history of CAD s/p remote CABG x3 in 2003 with subsequent BMS to SVG-RI in 2017 (last cath in 09/2022 showed 2/3 patent grafts with occluded SVG-RI), ischemic cardiomyopathy/ chronic HFpEF with EF of 55-60% on Echo in 09/2022, permanent atrial fibrillation and paroxysmal atrial flutter not on anticoagulation given GI bleeds and anemia, aortic stenosis, hypertension, hyperlipidemia, chronic venous stasis dermatitis, and GI bleeding with recurrent symptomatic anemia who presented on 03/31/2023 with weakness and was noted to be hypotension with a hemoglobin of 6.2. He was admitted for decompensated CHF and recurrent anemia. He was started on pressors and transfused with PRBC. Echo showed a new drop in EF to 30-35%. Cardiology was consulted for assistance.  Assessment & Plan    Acute on Chronic HFrEF Patient has a history of chronic HFpEF with EF of 55-60% on Echo in 09/2022. He presented with generalized and was found to be in decompensated CHF and severely anemic. BNP elevated at 1,121. Chest x-ray showed cardiomegaly with moderate pulmonary vascular congestion and bilateral effusions compatible with CHF. Echo showed LVEF of 30-35% with  global hypokinesis and mild LVH, mildly reduced RV, biatrial enlargement (left > right), mild AS (mean gradient 14.0), and mild MR/ MS. Documented 950 cc of urinary output yesterday and net negative 7 L this admission.  - Still looks volume overloaded. + JVD; - Increase IV Lasix to 60mg  twice daily  - GDMT limited due to hypotension. Only on low dose Lopressor for rate control.  - BP this AM is elevated -- using a tiny BP cuff. If BP is improved on recheck, can titrate GDMT. - consider adding SGLT2i now that renal function has improved - Continue to monitor daily weights, strict I/Os, and renal function. - No evidence of an acute ischemic event to explain drop in EF. He is not a candidate for cardiac catheterization with his severe anemia.  Demand Ischemia CAD s/p CABG S/p remote CABG in 2003 with subsequent PCI with BMS to SVG-RI. Last cath in 09/2022 showed an occluded SVG to RI but patent LIMA to LAD and RIMA to PDA. Continued medical therapy recommended. High-sensitivity troponin peaked at 2,438 but this is felt to be due to demand ischemia from decompensate CHF and severe anemia. Echo showed a drop in EF to 30-35% with global hypokinesis.. - No chest pain. - No Aspirin given severe anemia. - Continue beta-blocker and high-intensity statin. - Given no chest pain and troponin consistent with demand ischemia, drop in EF not felt to be due to acute arterial graft failure. Also not a candidate for cardiac catheterization at this time.  Permanent Atrial Fibrillation Rates mostly in the 80s  to low 100s. - Continue Lopressor 12.5mg  twice daily. - CHA2DS2-VASc = 5 (CAD, CHF, HTN, age x2). Not a candidate for anticoagulation with severe anemia and GI bleeding. He has had CTA to assess for Watchman and can continue to follow-up with Dr. Lalla Brothers as an outpatient.  Hypotension He was hypotensive on arrival and initially required Levophed in addition to blood transfusion. BP remains low-normal but is  stable. - Continue low dose Lopressor as above.  Hyperlipidemia - Continue Lipitor 80mg  daily.  Acute on Chronic Anemia Hemoglobin 6.2 on admission. Baseline around 7.5. Hemoccult positive. Recent EGD on 02/15/2023 showed gastritis and a non-bleeding duodenal ulcer with a clean ulcer base. Recent colonoscopy on 03/13/2023 showed non-bleeding internal hemorrhoid and two 3-35mm polyps in the ascending colon and in the cecum which were removed with a cold snare. GI and Hematology consulted and anemia not felt to be due to GI bleeding. Reticulocyte count has been low indicating a bone marrow production issue. S/p bone marrow biopsy on 8/1.  - Hemoglobin trending down, now 7.3 - Management per Internal Medicine and Hematology.  AKI on CKD Stage III Creatinine 1.94 on admission. Baseline around 0.9 to 1.2. Likely due to hypotension and decompensated CHF. Improved. - Creatinine stable at 1.07 today.  - Continue to monitor with diuresis.    For questions or updates, please contact [facility] Please consult [email or web address] for contact info under        Signed, Maurice Small, MD  04/07/2023, 7:47 AM

## 2023-04-08 ENCOUNTER — Ambulatory Visit: Payer: Medicare HMO | Admitting: Cardiology

## 2023-04-08 ENCOUNTER — Encounter: Payer: Self-pay | Admitting: Cardiology

## 2023-04-08 DIAGNOSIS — I4821 Permanent atrial fibrillation: Secondary | ICD-10-CM | POA: Diagnosis not present

## 2023-04-08 DIAGNOSIS — I5023 Acute on chronic systolic (congestive) heart failure: Secondary | ICD-10-CM | POA: Diagnosis not present

## 2023-04-08 DIAGNOSIS — I2489 Other forms of acute ischemic heart disease: Secondary | ICD-10-CM | POA: Diagnosis not present

## 2023-04-08 DIAGNOSIS — D649 Anemia, unspecified: Secondary | ICD-10-CM | POA: Diagnosis not present

## 2023-04-08 LAB — SURGICAL PATHOLOGY

## 2023-04-08 LAB — PREPARE RBC (CROSSMATCH)

## 2023-04-08 MED ORDER — RENA-VITE PO TABS
1.0000 | ORAL_TABLET | Freq: Every day | ORAL | Status: DC
  Administered 2023-04-08 – 2023-04-13 (×6): 1 via ORAL
  Filled 2023-04-08 (×6): qty 1

## 2023-04-08 MED ORDER — SODIUM CHLORIDE 0.9% IV SOLUTION: Freq: Once | INTRAVENOUS | Status: AC

## 2023-04-08 MED ORDER — FUROSEMIDE 10 MG/ML IJ SOLN: 40.0000 mg | Freq: Once | INTRAMUSCULAR | Status: DC

## 2023-04-08 NOTE — Progress Notes (Addendum)
Rounding Note    Patient Name: Leonard Cisneros Date of Encounter: 04/08/2023  [facility] Cardiologist: [name], MD   Subjective   Net -2.1 L yesterday, but also with unmeasured UOP.  -4.6 L on admission.  BP 95/57 this morning.  Creatinine stable at 1.06, sodium 135 > 131.  Hemoglobin stable at 7.3.  Weight is down 6 pounds from yesterday.  Denies any dyspnea or chest pain  Inpatient Medications    Scheduled Meds:  sodium chloride   Intravenous Once   atorvastatin  80 mg Oral Daily   Chlorhexidine Gluconate Cloth  6 each Topical Daily   dapagliflozin propanediol  10 mg Oral Daily   diphenhydrAMINE  25 mg Oral Once   feeding supplement  1 Container Oral TID BM   furosemide  20 mg Intravenous Once   furosemide  60 mg Intravenous BID   metoprolol tartrate  12.5 mg Oral BID   pantoprazole  40 mg Oral BID   polyethylene glycol  17 g Oral BID   potassium chloride SA  20 mEq Oral Daily   simethicone  80 mg Oral QID   Continuous Infusions:  sodium chloride     PRN Meds: docusate sodium, melatonin, mouth rinse   Vital Signs    Vitals:   04/07/23 1917 04/07/23 2320 04/08/23 0523 04/08/23 0721  BP: 130/60 103/62 102/63 (!) 95/57  Pulse:  88    Resp: 20 18 17 18   Temp: 97.8 F (36.6 C) 97.8 F (36.6 C) (!) 97.5 F (36.4 C) 97.7 F (36.5 C)  TempSrc: Oral Oral Oral Oral  SpO2: 98% 92% 98% 98%  Weight:   86.2 kg   Height:        Intake/Output Summary (Last 24 hours) at 04/08/2023 0738 Last data filed at 04/08/2023 0525 Gross per 24 hour  Intake 480 ml  Output 2600 ml  Net -2120 ml      04/08/2023    5:23 AM 04/07/2023    5:00 AM 04/06/2023    5:00 AM  Last 3 Weights  Weight (lbs) 190 lb 0.6 oz 196 lb 10.4 oz 195 lb 8.8 oz  Weight (kg) 86.2 kg 89.2 kg 88.7 kg      Telemetry    Atrial fibrillation with rates 80-90s overnight, up to 110s this morning- Personally Reviewed  ECG    No new ECG tracing today. - Personally Reviewed  Physical Exam    GEN: Elderly Caucasian male in no acute distress.   Neck: JVD elevated with patient sitting upright in chair. Cardiac: Irregularly irregular rhythm with normal rate. II/VI murmur noted.   Respiratory: No increased work of breathing. No significant wheezes, rhonchi, or rales appreciated. GI: Soft,, and non-tender. MS: 2+ pitting edema of bilaterally lower extremities. Right lower extremity is weeping and is wrapped in gauze. Skin: Warm and dry. Neuro:  No focal deficits. Psych: Normal affect. Responds appropriately.  Labs    High Sensitivity Troponin:   Recent Labs  Lab 03/31/23 1004 03/31/23 1416 03/31/23 2133 03/31/23 2354 04/01/23 0542  TROPONINIHS 2,150* 1,742* 2,178* 2,438* 2,427*     Chemistry Recent Labs  Lab 04/06/23 0133 04/07/23 0210 04/08/23 0115  NA 133* 135 131*  K 3.7 4.0 3.8  CL 97* 100 96*  CO2 27 28 26   GLUCOSE 103* 96 93  BUN 12 14 16   CREATININE 1.03 1.10 1.06  CALCIUM 8.1* 8.4* 8.1*  MG 1.9 1.8 2.2  ALBUMIN  --   --  2.9*  GFRNONAA >60 >60 >60  ANIONGAP 9 7 9     Lipids No results for input(s): "CHOL", "TRIG", "HDL", "LABVLDL", "LDLCALC", "CHOLHDL" in the last 168 hours.  Hematology Recent Labs  Lab 04/06/23 0133 04/06/23 1547 04/07/23 0210 04/08/23 0115  WBC 6.2  --  6.5 6.8  RBC 2.14*  --  2.11* 2.19*  HGB 7.6* 7.6* 7.3* 7.3*  HCT 23.3* 23.6* 22.7* 23.3*  MCV 108.9*  --  107.6* 106.4*  MCH 35.5*  --  34.6* 33.3  MCHC 32.6  --  32.2 31.3  RDW 23.3*  --  23.3* 23.5*  PLT 177  --  168 195   Thyroid No results for input(s): "TSH", "FREET4" in the last 168 hours.  BNP No results for input(s): "BNP", "PROBNP" in the last 168 hours.   DDimer No results for input(s): "DDIMER" in the last 168 hours.   Radiology    No results found.  Cardiac Studies   Right/ Left Cardiac Catheterization 09/27/2022:   Ost LAD to Prox LAD lesion is 100% stenosed.   Ramus lesion is 100% stenosed.   Prox RCA to Mid RCA lesion is 60% stenosed.   Dist  RCA lesion is 90% stenosed.   Origin to Prox Graft lesion is 100% stenosed.   LIMA graft was visualized by angiography and is large.   RIMA graft was visualized by non-selective angiography and is normal in caliber.   The graft exhibits no disease.   The graft exhibits no disease.   LV end diastolic pressure is mildly elevated.   Hemodynamic findings consistent with mild pulmonary hypertension.   There is moderate aortic valve stenosis.   3 vessel occlusive CAD. The native LCx is widely patent LIMA to the LAD is patent RIMA to the PDA is patent SVG to the ramus intermediate is occluded. This appears to be chronic Mild to Moderate aortic stenosis. AV mean gradient 18.5 mm Hg. AVA 2.5 cm squared with index 1.15. Elevated LV filling pressures. LVEDP 20 mm Hg. Mean PCWP 24 mm Hg with large V waves to 42 mm Hg Mild pulmonary HTN. PAP 56/17 with mean 31 mm Hg.    Plan: medical management. Continue diuresis. Would benefit greatly from correction of severe anemia. Will need to monitor Aortic stenosis serially going forward.   Diagnostic Dominance: Right  _______________  Echocardiogram 04/02/2023: Impressions: 1. Left ventricular ejection fraction, by estimation, is 30 to 35%. The  left ventricle has moderately decreased function. The left ventricle  demonstrates global hypokinesis. The left ventricular internal cavity size  was mildly dilated. There is mild  concentric left ventricular hypertrophy. Left ventricular diastolic  parameters are indeterminate.   2. Right ventricular systolic function is mildly reduced. The right  ventricular size is mildly enlarged. Tricuspid regurgitation signal is  inadequate for assessing PA pressure.   3. Left atrial size was severely dilated.   4. Right atrial size was moderately dilated.   5. The mitral valve is normal in structure. Mild mitral valve  regurgitation. Mild mitral stenosis. The mean mitral valve gradient is 6.0  mmHg. Moderate mitral  annular calcification.   6. The aortic valve is tricuspid. There is moderate calcification of the  aortic valve. Aortic valve regurgitation is not visualized. Mild aortic  valve stenosis. Aortic valve mean gradient measures 14.0 mmHg.   7. The inferior vena cava is dilated in size with <50% respiratory  variability, suggesting right atrial pressure of 15 mmHg.    Patient Profile     82  y.o. male with a history of CAD s/p remote CABG x3 in 2003 with subsequent BMS to SVG-RI in 2017 (last cath in 09/2022 showed 2/3 patent grafts with occluded SVG-RI), ischemic cardiomyopathy/ chronic HFpEF with EF of 55-60% on Echo in 09/2022, permanent atrial fibrillation and paroxysmal atrial flutter not on anticoagulation given GI bleeds and anemia, aortic stenosis, hypertension, hyperlipidemia, chronic venous stasis dermatitis, and GI bleeding with recurrent symptomatic anemia who presented on 03/31/2023 with weakness and was noted to be hypotension with a hemoglobin of 6.2. He was admitted for decompensated CHF and recurrent anemia. He was started on pressors and transfused with PRBC. Echo showed a new drop in EF to 30-35%. Cardiology was consulted for assistance.  Assessment & Plan    Acute on Chronic HFrEF Patient has a history of chronic HFpEF with EF of 55-60% on Echo in 09/2022. He presented with generalized and was found to be in decompensated CHF and severely anemic. BNP elevated at 1,121. Chest x-ray showed cardiomegaly with moderate pulmonary vascular congestion and bilateral effusions compatible with CHF. Echo showed LVEF of 30-35% with global hypokinesis and mild LVH, mildly reduced RV, biatrial enlargement (left > right), mild AS, and mild MR/ MS.  - Still looks volume overloaded. + JVD - Continue  IV Lasix to 60mg  twice daily  - GDMT limited due to hypotension. On low dose Lopressor for rate control, will consolidate to toprol XL prior to discharge.  Continue farxiga 10 mg daily - Continue to monitor  daily weights, strict I/Os, and renal function. - He is not a candidate for cardiac catheterization with his severe anemia.   Demand Ischemia CAD s/p CABG S/p remote CABG in 2003 with subsequent PCI with BMS to SVG-RI. Last cath in 09/2022 showed an occluded SVG to RI but patent LIMA to LAD and RIMA to PDA. Continued medical therapy recommended. High-sensitivity troponin peaked at 2,438 but could represent demand ischemia from decompensated CHF and severe anemia. Echo showed a drop in EF to 30-35% with global hypokinesis.. - No chest pain. - Has not been on aspirin given severe anemia. - Continue beta-blocker and high-intensity statin. - Not a candidate for cardiac catheterization at this time.   Permanent Atrial Fibrillation Rates mostly in the 80s to low 100s. - Continue Lopressor 12.5mg  twice daily. - CHA2DS2-VASc = 5 (CAD, CHF, HTN, age x2). Not a candidate for anticoagulation with severe anemia and GI bleeding. He has had CTA to assess for Watchman and can continue to follow-up with Dr. Lalla Brothers as an outpatient.   Hypotension He was hypotensive on arrival and initially required Levophed in addition to blood transfusion. BP remains low-normal but is stable. - Continue low dose Lopressor as above.   Hyperlipidemia - Continue Lipitor 80mg  daily.   Acute on Chronic Anemia Hemoglobin 6.2 on admission.  Has been as low as 5.0 12/2022.  Hemoccult positive. Recent EGD on 02/15/2023 showed gastritis and a non-bleeding duodenal ulcer with a clean ulcer base. Recent colonoscopy on 03/13/2023 showed non-bleeding internal hemorrhoid and two 3-22mm polyps in the ascending colon and in the cecum which were removed with a cold snare. GI and Hematology consulted and anemia not felt to be due to GI bleeding. Reticulocyte count has been low indicating a bone marrow production issue. 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  Being transfused 1 unit PRBCs this  morning - Management per Internal Medicine and Hematology.   AKI on CKD Stage III Creatinine  1.94 on admission. Baseline around 0.9 to 1.2. Likely due to hypotension and decompensated CHF. Improved. - Creatinine stable at 1.06 today - Continue to monitor with diuresis.    For questions or updates, please contact [facility] Please consult [email or web address] for contact info under        Signed, Little Ishikawa, MD  04/08/2023, 7:38 AM

## 2023-04-08 NOTE — Progress Notes (Signed)
Heart Failure Stewardship Pharmacist Progress Note   PCP: [name], FNP PCP-Cardiologist: [name], MD    HPI:  82 yo M with PMH of CHF, aflutter, CAD s/p CABG x3 (2003) and subsequent PCI, HTN, HLD, moderate aortic stenosis, permanent AF on apixaban.   Admitted 09/2022 with acute on chronic HFpEF after failed outpatient management with increasing torsemide. BNP 288. CXR with pulmonary edema. EF 55-60% on ECHO. Started on IV lasix and GDMT optimized. R/LHC with 3 vessel CAD and volume overload recommending medical management. He was started on Farxiga and approved for patient assistance from [name]. He was discharged and had follow up with HF TOC clinic. ReDS 38% and some volume overload. Continued on torsemide 80 mg daily and instructed to take additional 20 mg PRN for weight gain.   Has had a few hospitalizations since for acute on chronic HF and GI bleeds / anemia.   Presented back to the ED on 7/28 with generalized weakness, shortness of breath, LE edema, and weeping leg. [facility] bleed, hgb 6.2, FOBT +. Initially hypotensive requiring norepinephrine. GI and hematology consulted. Transfused with 2 units PRBC and hgb has been stable. GI signed off. Hematology ordering bone marrow biopsy - done 8/1.   ECHO 7/30 showed LVEF 30-35%, global hypokinesis, mild concentric LVH, RV mildly reduced, mild MR, mild AS.   Current HF Medications: Diuretic: furosemide 60 mg IV BID Beta Blocker: metoprolol tartrate 12.5 mg BID SGLT2i: Farxiga 10 mg daily  Prior to admission HF Medications: Diuretic: torsemide 40 mg BID Beta blocker: metoprolol tartrate 25 mg BID *was taking Farxiga 10 mg daily  Pertinent Lab Values: Serum creatinine 1.06, BUN 16, Potassium 3.8, Sodium 131, BNP 1121, Magnesium 2.2  Vital Signs: Weight: 190 lbs (admission weight: 189 lbs) Blood pressure: 90/50s  Heart rate: 80s  I/O: net -1.8L yesterday; net -4.7L since  admission  Medication Assistance / Insurance Benefits Check: Does the patient have prescription insurance?  Yes Type of insurance plan: [name]  Does the patient qualify for medication assistance through manufacturers or grants?   Yes Eligible grants and/or patient assistance programs: Farxiga Medication assistance applications in progress: none  Medication assistance applications approved: Farxiga Approved medication assistance renewals will be completed by: [facility]  Outpatient Pharmacy:  Prior to admission [name] Is the patient willing to use [facility] TOC pharmacy at discharge? Yes Is the patient willing to transition their outpatient pharmacy to utilize a [facility] outpatient pharmacy?  No    Assessment: 1. Acute on chronic systolic CHF (LVEF 30-35%). NYHA class III symptoms. - Continue furosemide 60 mg IV BID. Strict I/Os and daily weights. Keep K>4 and Mg>2. - Continue metoprolol tartrate 12.5 mg BID, will need to consolidate to metoprolol XL on discharge - BP low, no MRA or ARB yet to allow for aggressive diuresis - Continue Farxiga 10 mg daily. He has been approved for patient assistance and receives this in the mail from [name]. Wife confirms he has this supply at home. He will not need a new refill of this medication at discharge.   Plan: 1) Medication changes recommended at this time: - Continue IV diuresis  2) Patient assistance: - Approved for Farxiga patient assistance - last delivery for 90 day supply sent on 01/25/23  3)  Education  - Patient has been educated on current HF medications and potential additions to HF medication regimen - Patient verbalizes understanding that over the next few months, these medication doses  may change and more medications may be added to optimize HF regimen - Patient has been educated on basic disease state pathophysiology and goals of therapy   [name], PharmD, BCPS Heart Failure Stewardship Pharmacist Phone  [phone]

## 2023-04-08 NOTE — Progress Notes (Signed)
PROGRESS NOTE    Leonard Cisneros  NUU:[id number] DOB: [birth year] DOA: 03/31/2023 PCP: [name], FNP    Chief Complaint  Patient presents with   Weakness    Brief Narrative:  82 year old man with increasing lethargy and dyspnea at home. Found to have hemoglobin of 6.2 in ED.  Transfusion started.  Hypotensive into the 80s requiring norepinephrine.  Transferred from [facility] for ICU admission. Denies frank melena.  No vomiting. History of ischemic cardiomyopathy with triple-vessel disease.  Prior bypass 2003 . Medical management was recommended.  EF is normal.  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 Atrial fibrillation status post Watchman. History of chronic anemia with negative colonoscopy and EGD.  Ferritin has been normal in the past.  B12 and folate normal.     Assessment & Plan:   Principal Problem:   GIB (gastrointestinal bleeding) Active Problems:   History of duodenal ulcer   Anemia   AKI (acute kidney injury) (HCC)   Acute on chronic HFrEF (heart failure with reduced ejection fraction) (HCC)   Microcytic anemia   Demand ischemia   Atrial fibrillation (HCC)  #1 acute decompensated biventricular systolic heart failure -Patient noted to have required pressors of norepinephrine on admission with complaints of significant shortness of breath and noted to have a significant anemia. -Troponins obtained on admission elevated however seem to plateau at 2178>> 2438>>> 2427. -Patient status post transfusion of 2 units PRBC with hemoglobin currently at 7.3. -2D echo obtained during this hospitalization with a EF of 30 to 35%, left ventricular global hypokinesis, mild concentric LVH, severely dilated left atrial size, moderately dilated right atrial size, mild MVR, mild MS.  Mild aortic valve stenosis. -EF on recent 2D echo significantly reduced from prior EF of 55 to 60% from January 2024. -BP was soft however improving. -Was on  IV Lasix 40 mg every 12 hours, and dose adjusted to 60 mg IV every 12 hours per cardiology as patient still with significant volume overload on examination. -BP soft, patient with significant volume overload on examination and as such received IV albumin twice daily x 4 doses in addition to IV Lasix.   -IV Lasix increased to 60 mg every 12 hours per cardiology.  -Patient with urine output of 2.6 L over the past 24 hours.  Patient is -4.716 L during this hospitalization. -Patient being followed by cardiology who are recommending continuation of diuresis with Lasix and Lopressor and no need for pressors/ionotropes at this time. -Per cardiology.  2.  CAD/CABG/demand ischemia -Patient noted to have cath in 09/27/2022 with patent RIMA/LIMA.  Patient denies any chest pain and at this point in time cardiology does not feel patient is a candidate for catheterization recommending medical management at this time. -Transfuse 1 unit PRBCs to keep hemoglobin > 8.  -Per cardiology.  3.  Severe symptomatic anemia -Patient presented with severe symptomatic anemia with shortness of breath, noted to have a hemoglobin of 6.2 on presentation. -Status post transfusion 2 units PRBCs. -Hemoglobin currently at 7.3. -FOBT noted to be positive. -Patient with a macrocytic anemia. -Patient seen in consultation by GI and patient noted to have a recent EGD 02/15/2023 that showed mild gastritis and a clean-based 3 mm duodenal ulcer with biopsies negative for H. pylori. -Patient noted to have a colonoscopy 03/13/2023 which showed nonbleeding internal hemorrhoids and 2 small tubular adenomas which were removed. -Anemia panel not consistent with iron deficiency anemia, -Patient seen in consultation by hematology and bone marrow biopsy  ordered which was done on 04/04/2023.  -1 unit PRBCs ordered 04/07/2023, awaiting transfusion of PRBCs.  -Goal hemoglobin > 8.  -Follow H&H. -GI was following but have signed off as of 04/03/2023. -Per  hematology/oncology.  4.  Permanent A-fib status post ablation -Currently rate controlled on Lopressor. -Not a anticoagulation candidate due to recurrent anemias and concern for prior history of GI bleed. -Per cardiology.  5.  Hyperlipidemia -Statin.   6.  Demand ischemia -Felt likely secondary to anemia in the setting of history of CAD status post CABG. -Troponins on admission noted at 2150 seem to have plateaued and peaked at 2438. -Patient denies any angina/chest pain. -Keep hemoglobin > 8. -Continue current cardiac regimen of statin, beta-blocker, diuretics.  Aspirin on hold in the setting of possible GI bleed/anemia.  7.  Chronic venous stasis dermatitis -Continue current local wound care.  8.  AKI -Likely secondary to prerenal azotemia in the setting of decompensated CHF, presentation with hypotension. -Renal function improved with transfusion of PRBC and diuresis. -Urine output of 2.6 L over the past 24 hours.  Also unmeasured urine noted x 2.  -Renal function improving daily. -Monitor renal function with diuresis. -Follow.  9.  Hypokalemia -Secondary to diuresis. -Repleted. -Potassium at 3.8.   DVT prophylaxis: SCDs. Code Status: Full Family Communication: Updated patient.  No family at bedside.  Disposition: Likely home when clinically improved and euvolemic, labs stable, cleared by cardiology.  Status is: Inpatient Remains inpatient appropriate because: Severity of illness   Consultants:  IR: Alwyn Ren, NP 04/03/2023 Oncology: Dr. Mosetta Putt 04/02/2023 Cardiology: Dr. Excell Seltzer 04/02/2023 Gastroenterology: Dr. Leonides Schanz 04/01/2023  Procedures:  Transfusion 2 units PRBC 03/31/2023 Chest x-ray 03/31/2023 2D echo 04/02/2023 CT bone marrow biopsy and aspiration per IR: Dr. Fredia Sorrow 04/04/2023 Transfusion 1 unit PRBCs pending 04/08/2023  Antimicrobials:  Anti-infectives (From admission, onward)    Start     Dose/Rate Route Frequency Ordered Stop   03/31/23 1000   cefTRIAXone (ROCEPHIN) 2 g in sodium chloride 0.9 % 100 mL IVPB  Status:  Discontinued        2 g 200 mL/hr over 30 Minutes Intravenous Every 24 hours 03/31/23 0949 03/31/23 1530   03/31/23 1000  azithromycin (ZITHROMAX) 500 mg in sodium chloride 0.9 % 250 mL IVPB  Status:  Discontinued        500 mg 250 mL/hr over 60 Minutes Intravenous Every 24 hours 03/31/23 0949 03/31/23 1530         Subjective: Sitting up in recliner.  No chest pain.  Denies any significant shortness of breath.  Had bowel movement yesterday nonbloody.  No lightheadedness or dizziness.  States he is having good urine output.    Objective: Vitals:   04/07/23 1917 04/07/23 2320 04/08/23 0523 04/08/23 0721  BP: 130/60 103/62 102/63 (!) 95/57  Pulse:  88    Resp: 20 18 17 18   Temp: 97.8 F (36.6 C) 97.8 F (36.6 C) (!) 97.5 F (36.4 C) 97.7 F (36.5 C)  TempSrc: Oral Oral Oral Oral  SpO2: 98% 92% 98% 98%  Weight:   86.2 kg   Height:        Intake/Output Summary (Last 24 hours) at 04/08/2023 1007 Last data filed at 04/08/2023 0818 Gross per 24 hour  Intake 600 ml  Output 1925 ml  Net -1325 ml   Filed Weights   04/06/23 0500 04/07/23 0500 04/08/23 0523  Weight: 88.7 kg 89.2 kg 86.2 kg    Examination:  General exam: Appears calm and comfortable  Respiratory system: Decreasing bibasilar crackles.  No wheezing.  Fair air movement.  Speaking in full sentences.  Cardiovascular system: Irregularly irregular with 2/6 SEM.+ JVD.  3+ bilateral lower extremity edema up to hips.  Chronic venous stasis changes lower extremities. Gastrointestinal system: Abdomen is soft, nontender, mildly distended, positive bowel sounds.  No rebound.  No guarding.  Central nervous system: Alert and oriented. No focal neurological deficits. Extremities: 3+ bilateral lower extremity edema up to hips, with chronic venous stasis changes noted.  Symmetric 5 x 5 power. Skin: No rashes, lesions or ulcers Psychiatry: Judgement and insight  appear normal. Mood & affect appropriate.     Data Reviewed: I have personally reviewed following labs and imaging studies  CBC: Recent Labs  Lab 04/04/23 0308 04/05/23 0153 04/06/23 0133 04/06/23 1547 04/07/23 0210 04/08/23 0115  WBC 6.9 8.1 6.2  --  6.5 6.8  NEUTROABS 4.4  --   --   --   --   --   HGB 8.3* 8.8* 7.6* 7.6* 7.3* 7.3*  HCT 25.5* 27.1* 23.3* 23.6* 22.7* 23.3*  MCV 108.1* 108.4* 108.9*  --  107.6* 106.4*  PLT 180 206 177  --  168 195    Basic Metabolic Panel: Recent Labs  Lab 04/04/23 0308 04/05/23 0153 04/06/23 0133 04/07/23 0210 04/08/23 0115  NA 132* 135 133* 135 131*  K 3.4* 3.8 3.7 4.0 3.8  CL 98 102 97* 100 96*  CO2 26 26 27 28 26   GLUCOSE 88 97 103* 96 93  BUN 13 10 12 14 16   CREATININE 0.93 1.07 1.03 1.10 1.06  CALCIUM 7.8* 7.7* 8.1* 8.4* 8.1*  MG 1.8 2.0 1.9 1.8 2.2  PHOS  --   --   --   --  3.9    GFR: Estimated Creatinine Clearance: 58.2 mL/min (by C-G formula based on SCr of 1.06 mg/dL).  Liver Function Tests: Recent Labs  Lab 04/08/23 0115  ALBUMIN 2.9*     CBG: No results for input(s): "GLUCAP" in the last 168 hours.    Recent Results (from the past 240 hour(s))  Blood Culture (routine x 2)     Status: None   Collection Time: 03/31/23 10:04 AM   Specimen: Right Antecubital; Blood  Result Value Ref Range Status   Specimen Description   Final    RIGHT ANTECUBITAL BOTTLES DRAWN AEROBIC AND ANAEROBIC   Special Requests Blood Culture adequate volume  Final   Culture   Final    NO GROWTH 5 DAYS Performed at [facility], [address], [place], [place] [address]    Report Status 04/05/2023 FINAL  Final  Blood Culture (routine x 2)     Status: None   Collection Time: 03/31/23 10:04 AM   Specimen: Left Antecubital; Blood  Result Value Ref Range Status   Specimen Description   Final    LEFT ANTECUBITAL BOTTLES DRAWN AEROBIC AND ANAEROBIC   Special Requests   Final    Blood Culture results may not be optimal due to an  excessive volume of blood received in culture bottles   Culture   Final    NO GROWTH 5 DAYS Performed at [facility], [address], [place], [place] [address]    Report Status 04/05/2023 FINAL  Final  Resp panel by RT-PCR (RSV, Flu A&B, Covid) Anterior Nasal Swab     Status: None   Collection Time: 03/31/23 10:35 AM   Specimen: Anterior Nasal Swab  Result Value Ref Range Status   SARS  Coronavirus 2 by RT PCR NEGATIVE NEGATIVE Final    Comment: (NOTE) SARS-CoV-2 target nucleic acids are NOT DETECTED.  The SARS-CoV-2 RNA is generally detectable in upper respiratory specimens during the acute phase of infection. The lowest concentration of SARS-CoV-2 viral copies this assay can detect is 138 copies/mL. A negative result does not preclude SARS-Cov-2 infection and should not be used as the sole basis for treatment or other patient management decisions. A negative result may occur with  improper specimen collection/handling, submission of specimen other than nasopharyngeal swab, presence of viral mutation(s) within the areas targeted by this assay, and inadequate number of viral copies(<138 copies/mL). A negative result must be combined with clinical observations, patient history, and epidemiological information. The expected result is Negative.  Fact Sheet for Patients:  BloggerCourse.com  Fact Sheet for Healthcare Providers:  SeriousBroker.it  This test is no t yet approved or cleared by the [place] FDA and  has been authorized for detection and/or diagnosis of SARS-CoV-2 by FDA under an Emergency Use Authorization (EUA). This EUA will remain  in effect (meaning this test can be used) for the duration of the COVID-19 declaration under Section 564(b)(1) of the Act, 21 U.S.C.section 360bbb-3(b)(1), unless the authorization is terminated  or revoked sooner.       Influenza A by PCR NEGATIVE NEGATIVE Final   Influenza B by  PCR NEGATIVE NEGATIVE Final    Comment: (NOTE) The Xpert Xpress SARS-CoV-2/FLU/RSV plus assay is intended as an aid in the diagnosis of influenza from Nasopharyngeal swab specimens and should not be used as a sole basis for treatment. Nasal washings and aspirates are unacceptable for Xpert Xpress SARS-CoV-2/FLU/RSV testing.  Fact Sheet for Patients: BloggerCourse.com  Fact Sheet for Healthcare Providers: SeriousBroker.it  This test is not yet approved or cleared by the [place] FDA and has been authorized for detection and/or diagnosis of SARS-CoV-2 by FDA under an Emergency Use Authorization (EUA). This EUA will remain in effect (meaning this test can be used) for the duration of the COVID-19 declaration under Section 564(b)(1) of the Act, 21 U.S.C. section 360bbb-3(b)(1), unless the authorization is terminated or revoked.     Resp Syncytial Virus by PCR NEGATIVE NEGATIVE Final    Comment: (NOTE) Fact Sheet for Patients: BloggerCourse.com  Fact Sheet for Healthcare Providers: SeriousBroker.it  This test is not yet approved or cleared by the [place] FDA and has been authorized for detection and/or diagnosis of SARS-CoV-2 by FDA under an Emergency Use Authorization (EUA). This EUA will remain in effect (meaning this test can be used) for the duration of the COVID-19 declaration under Section 564(b)(1) of the Act, 21 U.S.C. section 360bbb-3(b)(1), unless the authorization is terminated or revoked.  Performed at [facility], [address] [place], [place] [address]   MRSA Next Gen by PCR, Nasal     Status: None   Collection Time: 03/31/23  4:22 PM   Specimen: Nasal Mucosa; Nasal Swab  Result Value Ref Range Status   MRSA by PCR Next Gen NOT DETECTED NOT DETECTED Final    Comment: (NOTE) The GeneXpert MRSA Assay (FDA approved for NASAL specimens only), is one  component of a comprehensive MRSA colonization surveillance program. It is not intended to diagnose MRSA infection nor to guide or monitor treatment for MRSA infections. Test performance is not FDA approved in patients less than 50 years old. Performed at [facility], [address], [place], [place] [address]  Radiology Studies: No results found.      Scheduled Meds:  sodium chloride   Intravenous Once   sodium chloride   Intravenous Once   atorvastatin  80 mg Oral Daily   Chlorhexidine Gluconate Cloth  6 each Topical Daily   dapagliflozin propanediol  10 mg Oral Daily   diphenhydrAMINE  25 mg Oral Once   feeding supplement  1 Container Oral TID BM   furosemide  20 mg Intravenous Once   furosemide  60 mg Intravenous BID   metoprolol tartrate  12.5 mg Oral BID   pantoprazole  40 mg Oral BID   polyethylene glycol  17 g Oral BID   potassium chloride SA  20 mEq Oral Daily   simethicone  80 mg Oral QID   Continuous Infusions:  sodium chloride       LOS: 8 days    Time spent: 35 minutes    Ramiro Harvest, MD [facility]   To contact the attending provider between 7A-7P or the covering provider during after hours 7P-7A, please log into the web site [email or web address] and access using universal [place] password for that web site. If you do not have the password, please call the hospital operator.  04/08/2023, 10:07 AM

## 2023-04-08 NOTE — Progress Notes (Signed)
Initial Nutrition Assessment  DOCUMENTATION CODES:   Not applicable  INTERVENTION:   - Continue Boost Breeze po TID, each supplement provides 250 kcal and 9 grams of protein  - Liberalize diet to 2 gram sodium  - Renal MVI daily given CKD stage III  NUTRITION DIAGNOSIS:   Increased nutrient needs related to chronic illness as evidenced by estimated needs.  GOAL:   Patient will meet greater than or equal to 90% of their needs  MONITOR:   PO intake, Supplement acceptance, Labs, Weight trends, Skin, I & O's  REASON FOR ASSESSMENT:   Malnutrition Screening Tool    ASSESSMENT:   82 year old male who presented to the ED on 7/28 with generalized weakness. PMH of HFpEF, CAD s/p CABG, pulmonary HTN, HLD, atrial fibrillation, chronic anemia, recurrent GI bleeds, chronic venous stasis ulcers, CKD stage III.  8/01 - s/p CT guided bone marrow aspiration and biopsy  RD working remotely. Unable to reach pt via phone call to room.  Out of last 8 documented meal completions, 6 have been documented as 100%. Pt accepting majority of Boost Breeze supplements that have been ordered. Will liberalize diet to 2 gram sodium and order daily renal MVI.  Unable to obtain diet and weight history at this time. Reviewed weight history in chart. Weight trending down over the last 5 months. Pt with a weight loss of 15 kg since 11/16/22. This is a 14.8% weight loss in less than 5 months which is severe and significant for timeframe. Pt is at risk for malnutrition but unable to confirm at this time without NFPE.  Pt with moderate pitting edema to BLE.  Meal Completion: 50-100%  Medications reviewed and include: farxiga, Boost Breeze TID, IV lasix 60 mg BID, protonix, miralax, klor-con 20 mEq daily, simethicone  Labs reviewed: sodium 131, chloride 96, hemoglobin 7.3  UOP: 2600 ml x 24 hours I/O's: -4.7 L since admit  NUTRITION - FOCUSED PHYSICAL EXAM:  Unable to complete. RD working  remotely.  Diet Order:   Diet Order             Diet 2 gram sodium Room service appropriate? Yes; Fluid consistency: Thin  Diet effective now                   EDUCATION NEEDS:   No education needs have been identified at this time  Skin:  Skin Assessment: Skin Integrity Issues: Other: venous stasis ulcers BLE  Last BM:  04/07/23  Height:   Ht Readings from Last 1 Encounters:  03/31/23 5\' 11"  (1.803 m)    Weight:   Wt Readings from Last 1 Encounters:  04/08/23 86.2 kg    BMI:  Body mass index is 26.5 kg/m.  Estimated Nutritional Needs:   Kcal:  2000-2200  Protein:  100-120 grams  Fluid:  >2.0 L    Mertie Clause, MS, RD, LDN Registered Dietitian II Please see AMiON for contact information.

## 2023-04-08 NOTE — Progress Notes (Signed)
Mobility Specialist Progress Note:   04/08/23 1344  Mobility  Activity Ambulated with assistance in hallway  Level of Assistance Contact guard assist, steadying assist  Assistive Device Crutches  Distance Ambulated (ft) 260 ft  Activity Response Tolerated well  Mobility Referral Yes  $Mobility charge 1 Mobility  Mobility Specialist Start Time (ACUTE ONLY) 1325  Mobility Specialist Stop Time (ACUTE ONLY) 1343  Mobility Specialist Time Calculation (min) (ACUTE ONLY) 18 min    During Mobility: 99-123 HR  Post Mobility: 94 HR  Pt received in chair, agreeable to mobility. C/o slight discomfort in back, otherwise asymptomatic throughout. Pt returned to chair with call bell in reach and all needs met.   Leory Plowman  Mobility Specialist Please contact via [employer] office at [phone]

## 2023-04-08 NOTE — Progress Notes (Signed)
Physical Therapy Treatment Patient Details Name: Leonard Cisneros MRN: [id number] DOB: 19-Dec-1940 Today's Date: 04/08/2023   History of Present Illness 82 year old man brought to ED on 7/28 with increasing lethargy and dyspnea at home  Found to have hemoglobin of 6.2, transfusion started. Pt hypotensive. PMH: ischemic cardiomyopathy with triple-vessel disease, s/p bypass 2003. CAD, HTN, CAD, CHF, LE sores, R worse than L.    PT Comments  Pt received sitting in the recliner and agreeable to session. Pt able to tolerate increased gait distance, however continues to demonstrate a flexed posture despite cues. Attempted a stair trial with pt holding the L rail and B crutches in R hand, but pt unable to elevate LLE to step. Pt initially placing the crutches 2 steps up and was educated on proper technique, however pt becoming agitated and declining further attempts. Pt stating that he has 1 step to enter at the front of the house that he can use instead of the flight at the back of the house, however the one step is taller than the others. Pt continues to benefit from PT services to progress toward functional mobility goals.    If plan is discharge home, recommend the following: A little help with walking and/or transfers;A little help with bathing/dressing/bathroom;Assist for transportation;Help with stairs or ramp for entrance   Can travel by private vehicle        Equipment Recommendations  None recommended by PT    Recommendations for Other Services       Precautions / Restrictions Precautions Precautions: Fall Precaution Comments: R lower leg dressing with noted oozing/discharge Restrictions Weight Bearing Restrictions: No     Mobility  Bed Mobility               General bed mobility comments: Pt beginning and ending session in recliner    Transfers Overall transfer level: Modified independent Equipment used: Crutches Transfers: Sit to/from Stand                   Ambulation/Gait Ambulation/Gait assistance: Supervision Gait Distance (Feet): 260 Feet Assistive device: Crutches Gait Pattern/deviations: Step-through pattern, Decreased stride length, Trunk flexed Gait velocity: decreased     General Gait Details: Pt demonstrating slow step-through pattern with crutches placed wide and pt's trunk flexed with crutches resting on armpits. Cues for upright posture and forward gaze, however pt not improving. Intermittent min guard for safety due to some instability   Stairs Stairs:  (Attempted stairs with pt holding L rail and with crutches in R hand, however pt stating "i cant do it". Pt instructed in proper stair technique, however pt slightly impusive with attempts despite cues and becoming agitated with challenge.)             Balance Overall balance assessment: Needs assistance Sitting-balance support: Feet supported, No upper extremity supported Sitting balance-Leahy Scale: Good Sitting balance - Comments: sitting in recliner   Standing balance support: During functional activity, Bilateral upper extremity supported, Single extremity supported Standing balance-Leahy Scale: Fair Standing balance comment: with crutches support                            Cognition Arousal/Alertness: Awake/alert Behavior During Therapy: WFL for tasks assessed/performed, Agitated Overall Cognitive Status: Within Functional Limits for tasks assessed  Exercises      General Comments General comments (skin integrity, edema, etc.): RLE bandage noted to have drainage      Pertinent Vitals/Pain Pain Assessment Pain Assessment: No/denies pain     PT Goals (current goals can now be found in the care plan section) Acute Rehab PT Goals Patient Stated Goal: home PT Goal Formulation: With patient/family Time For Goal Achievement: 04/16/23 Potential to Achieve Goals: Good Progress towards  PT goals: Progressing toward goals    Frequency    Min 1X/week      PT Plan Current plan remains appropriate       AM-PAC PT "6 Clicks" Mobility   Outcome Measure  Help needed turning from your back to your side while in a flat bed without using bedrails?: None Help needed moving from lying on your back to sitting on the side of a flat bed without using bedrails?: None Help needed moving to and from a bed to a chair (including a wheelchair)?: None Help needed standing up from a chair using your arms (e.g., wheelchair or bedside chair)?: None Help needed to walk in hospital room?: A Little Help needed climbing 3-5 steps with a railing? : A Lot 6 Click Score: 21    End of Session Equipment Utilized During Treatment: Gait belt Activity Tolerance: Patient tolerated treatment well Patient left: in chair;with call bell/phone within reach Nurse Communication: Mobility status PT Visit Diagnosis: Unsteadiness on feet (R26.81);Muscle weakness (generalized) (M62.81)     Time: 2956-2130 PT Time Calculation (min) (ACUTE ONLY): 19 min  Charges:    $Gait Training: 8-22 mins PT General Charges $$ ACUTE PT VISIT: 1 Visit                    Johny Shock, PTA Acute Rehabilitation Services Secure Chat Preferred  [phone]    Johny Shock 04/08/2023, 9:45 AM

## 2023-04-08 NOTE — Plan of Care (Signed)

## 2023-04-09 DIAGNOSIS — K922 Gastrointestinal hemorrhage, unspecified: Secondary | ICD-10-CM | POA: Diagnosis not present

## 2023-04-09 DIAGNOSIS — Z8719 Personal history of other diseases of the digestive system: Secondary | ICD-10-CM

## 2023-04-09 DIAGNOSIS — I4821 Permanent atrial fibrillation: Secondary | ICD-10-CM | POA: Diagnosis not present

## 2023-04-09 DIAGNOSIS — D469 Myelodysplastic syndrome, unspecified: Secondary | ICD-10-CM | POA: Diagnosis not present

## 2023-04-09 DIAGNOSIS — I5023 Acute on chronic systolic (congestive) heart failure: Secondary | ICD-10-CM | POA: Diagnosis not present

## 2023-04-09 DIAGNOSIS — I2489 Other forms of acute ischemic heart disease: Secondary | ICD-10-CM | POA: Diagnosis not present

## 2023-04-09 DIAGNOSIS — I2510 Atherosclerotic heart disease of native coronary artery without angina pectoris: Secondary | ICD-10-CM

## 2023-04-09 DIAGNOSIS — D649 Anemia, unspecified: Secondary | ICD-10-CM | POA: Diagnosis not present

## 2023-04-09 LAB — HEMOGLOBIN AND HEMATOCRIT, BLOOD
HCT: 25.9 % — ABNORMAL LOW (ref 39.0–52.0)
Hemoglobin: 8.2 g/dL — ABNORMAL LOW (ref 13.0–17.0)

## 2023-04-09 LAB — PREPARE RBC (CROSSMATCH)

## 2023-04-09 MED ORDER — SODIUM CHLORIDE 0.9% IV SOLUTION: Freq: Once | INTRAVENOUS | Status: AC

## 2023-04-09 MED ORDER — DIPHENHYDRAMINE HCL 25 MG PO CAPS
25.0000 mg | ORAL_CAPSULE | Freq: Once | ORAL | Status: AC
  Administered 2023-04-09: 25 mg via ORAL
  Filled 2023-04-09: qty 1

## 2023-04-09 MED ORDER — DARBEPOETIN ALFA 150 MCG/0.3ML IJ SOSY
150.0000 ug | PREFILLED_SYRINGE | Freq: Once | INTRAMUSCULAR | Status: AC
  Administered 2023-04-09: 150 ug via SUBCUTANEOUS
  Filled 2023-04-09: qty 0.3

## 2023-04-09 MED ORDER — FUROSEMIDE 10 MG/ML IJ SOLN
20.0000 mg | Freq: Once | INTRAMUSCULAR | Status: AC
  Administered 2023-04-09: 20 mg via INTRAVENOUS
  Filled 2023-04-09: qty 2

## 2023-04-09 MED ORDER — POTASSIUM CHLORIDE CRYS ER 10 MEQ PO TBCR
40.0000 meq | EXTENDED_RELEASE_TABLET | Freq: Once | ORAL | Status: AC
  Administered 2023-04-09: 40 meq via ORAL
  Filled 2023-04-09: qty 4

## 2023-04-09 MED ORDER — ACETAMINOPHEN 325 MG PO TABS
650.0000 mg | ORAL_TABLET | Freq: Once | ORAL | Status: AC
  Administered 2023-04-09: 650 mg via ORAL
  Filled 2023-04-09: qty 2

## 2023-04-09 NOTE — Progress Notes (Signed)
Leonard Cisneros   DOB:[birth year]   XB#:[id number]   NUU#:[id number]  Hematology follow up   Subjective: Patient received 1u blood on 8/4 and today and tolerated well.  He still has significant leg edema, pt is not sure when he can go home.   Objective:  Vitals:   04/09/23 1309 04/09/23 1600  BP: (!) 100/52   Pulse: 81   Resp: 19   Temp: (!) 97.4 F (36.3 C)   SpO2: 95% 95%    Body mass index is 27.4 kg/m.  Intake/Output Summary (Last 24 hours) at 04/09/2023 1711 Last data filed at 04/09/2023 1309 Gross per 24 hour  Intake 1674.33 ml  Output 900 ml  Net 774.33 ml     Sclerae unicteric  Oropharynx clear  No peripheral adenopathy  Lungs clear -- no rales or rhonchi  Heart regular rate and rhythm  Abdomen benign  MSK no focal spinal tenderness, (+) significant bilateral lower extremity edema, right leg is wrapped with gauze  Neuro nonfocal  CBG (last 3)  No results for input(s): "GLUCAP" in the last 72 hours.   Labs:   Urine Studies No results for input(s): "UHGB", "CRYS" in the last 72 hours.  Invalid input(s): "UACOL", "UAPR", "USPG", "UPH", "UTP", "UGL", "UKET", "UBIL", "UNIT", "UROB", "ULEU", "UEPI", "UWBC", "URBC", "UBAC", "CAST", "UCOM", "BILUA"  Basic Metabolic Panel: Recent Labs  Lab 04/05/23 0153 04/06/23 0133 04/07/23 0210 04/08/23 0115 04/09/23 0400  NA 135 133* 135 131* 134*  K 3.8 3.7 4.0 3.8 3.5  CL 102 97* 100 96* 97*  CO2 26 27 28 26 28   GLUCOSE 97 103* 96 93 99  BUN 10 12 14 16 17   CREATININE 1.07 1.03 1.10 1.06 1.16  CALCIUM 7.7* 8.1* 8.4* 8.1* 8.4*  MG 2.0 1.9 1.8 2.2 2.0  PHOS  --   --   --  3.9  --    GFR Estimated Creatinine Clearance: 53.2 mL/min (by C-G formula based on SCr of 1.16 mg/dL). Liver Function Tests: Recent Labs  Lab 04/08/23 0115  ALBUMIN 2.9*   No results for input(s): "LIPASE", "AMYLASE" in the last 168 hours. No results for input(s): "AMMONIA" in the last 168 hours. Coagulation profile No results for input(s):  "INR", "PROTIME" in the last 168 hours.   CBC: Recent Labs  Lab 04/04/23 0308 04/05/23 0153 04/06/23 0133 04/06/23 1547 04/07/23 0210 04/08/23 0115 04/09/23 0400 04/09/23 1601  WBC 6.9 8.1 6.2  --  6.5 6.8 7.1  --   NEUTROABS 4.4  --   --   --   --   --   --   --   HGB 8.3* 8.8* 7.6* 7.6* 7.3* 7.3* 8.0* 8.2*  HCT 25.5* 27.1* 23.3* 23.6* 22.7* 23.3* 24.9* 25.9*  MCV 108.1* 108.4* 108.9*  --  107.6* 106.4* 102.9*  --   PLT 180 206 177  --  168 195 190  --    Cardiac Enzymes: No results for input(s): "CKTOTAL", "CKMB", "CKMBINDEX", "TROPONINI" in the last 168 hours. BNP: Invalid input(s): "POCBNP" CBG: No results for input(s): "GLUCAP" in the last 168 hours.  D-Dimer No results for input(s): "DDIMER" in the last 72 hours. Hgb A1c No results for input(s): "HGBA1C" in the last 72 hours. Lipid Profile No results for input(s): "CHOL", "HDL", "LDLCALC", "TRIG", "CHOLHDL", "LDLDIRECT" in the last 72 hours. Thyroid function studies No results for input(s): "TSH", "T4TOTAL", "T3FREE", "THYROIDAB" in the last 72 hours.  Invalid input(s): "FREET3" Anemia work up No results for input(s): "VITAMINB12", "FOLATE", "  FERRITIN", "TIBC", "IRON", "RETICCTPCT" in the last 72 hours.  Microbiology Recent Results (from the past 240 hour(s))  Blood Culture (routine x 2)     Status: None   Collection Time: 03/31/23 10:04 AM   Specimen: Right Antecubital; Blood  Result Value Ref Range Status   Specimen Description   Final    RIGHT ANTECUBITAL BOTTLES DRAWN AEROBIC AND ANAEROBIC   Special Requests Blood Culture adequate volume  Final   Culture   Final    NO GROWTH 5 DAYS Performed at [facility], [address], [place], [place] [address]    Report Status 04/05/2023 FINAL  Final  Blood Culture (routine x 2)     Status: None   Collection Time: 03/31/23 10:04 AM   Specimen: Left Antecubital; Blood  Result Value Ref Range Status   Specimen Description   Final    LEFT ANTECUBITAL BOTTLES  DRAWN AEROBIC AND ANAEROBIC   Special Requests   Final    Blood Culture results may not be optimal due to an excessive volume of blood received in culture bottles   Culture   Final    NO GROWTH 5 DAYS Performed at [facility], [address] [place], [place] [address]    Report Status 04/05/2023 FINAL  Final  Resp panel by RT-PCR (RSV, Flu A&B, Covid) Anterior Nasal Swab     Status: None   Collection Time: 03/31/23 10:35 AM   Specimen: Anterior Nasal Swab  Result Value Ref Range Status   SARS Coronavirus 2 by RT PCR NEGATIVE NEGATIVE Final    Comment: (NOTE) SARS-CoV-2 target nucleic acids are NOT DETECTED.  The SARS-CoV-2 RNA is generally detectable in upper respiratory specimens during the acute phase of infection. The lowest concentration of SARS-CoV-2 viral copies this assay can detect is 138 copies/mL. A negative result does not preclude SARS-Cov-2 infection and should not be used as the sole basis for treatment or other patient management decisions. A negative result may occur with  improper specimen collection/handling, submission of specimen other than nasopharyngeal swab, presence of viral mutation(s) within the areas targeted by this assay, and inadequate number of viral copies(<138 copies/mL). A negative result must be combined with clinical observations, patient history, and epidemiological information. The expected result is Negative.  Fact Sheet for Patients:  BloggerCourse.com  Fact Sheet for Healthcare Providers:  SeriousBroker.it  This test is no t yet approved or cleared by the [place] FDA and  has been authorized for detection and/or diagnosis of SARS-CoV-2 by FDA under an Emergency Use Authorization (EUA). This EUA will remain  in effect (meaning this test can be used) for the duration of the COVID-19 declaration under Section 564(b)(1) of the Act, 21 U.S.C.section 360bbb-3(b)(1), unless the  authorization is terminated  or revoked sooner.       Influenza A by PCR NEGATIVE NEGATIVE Final   Influenza B by PCR NEGATIVE NEGATIVE Final    Comment: (NOTE) The Xpert Xpress SARS-CoV-2/FLU/RSV plus assay is intended as an aid in the diagnosis of influenza from Nasopharyngeal swab specimens and should not be used as a sole basis for treatment. Nasal washings and aspirates are unacceptable for Xpert Xpress SARS-CoV-2/FLU/RSV testing.  Fact Sheet for Patients: BloggerCourse.com  Fact Sheet for Healthcare Providers: SeriousBroker.it  This test is not yet approved or cleared by the [place] FDA and has been authorized for detection and/or diagnosis of SARS-CoV-2 by FDA under an Emergency Use Authorization (EUA). This EUA will remain in effect (meaning this test can  be used) for the duration of the COVID-19 declaration under Section 564(b)(1) of the Act, 21 U.S.C. section 360bbb-3(b)(1), unless the authorization is terminated or revoked.     Resp Syncytial Virus by PCR NEGATIVE NEGATIVE Final    Comment: (NOTE) Fact Sheet for Patients: BloggerCourse.com  Fact Sheet for Healthcare Providers: SeriousBroker.it  This test is not yet approved or cleared by the [place] FDA and has been authorized for detection and/or diagnosis of SARS-CoV-2 by FDA under an Emergency Use Authorization (EUA). This EUA will remain in effect (meaning this test can be used) for the duration of the COVID-19 declaration under Section 564(b)(1) of the Act, 21 U.S.C. section 360bbb-3(b)(1), unless the authorization is terminated or revoked.  Performed at [facility], [address], [place], [place] [address]   MRSA Next Gen by PCR, Nasal     Status: None   Collection Time: 03/31/23  4:22 PM   Specimen: Nasal Mucosa; Nasal Swab  Result Value Ref Range Status   MRSA by PCR Next Gen NOT  DETECTED NOT DETECTED Final    Comment: (NOTE) The GeneXpert MRSA Assay (FDA approved for NASAL specimens only), is one component of a comprehensive MRSA colonization surveillance program. It is not intended to diagnose MRSA infection nor to guide or monitor treatment for MRSA infections. Test performance is not FDA approved in patients less than 90 years old. Performed at [facility], [address], [place], [place] [address]       Studies:  No results found.  Assessment: 82 y.o.  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  Plan:  -I reviewed his pulmonary biopsy results with patient, hypercellular bone marrow, 50% RT, with mild dyspoiesis and increased ring sideroblasts.  Dyspoietic changes including occasional hyperlobulated abnormally located megakaryocytes and ring sideroblasts 15%, no blast cells was seen in the bone marrow.  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  Flow cytometry was negative.  Further molecular testing is still pending. -The prognosis of his MSDS and risk stratification will depend on the cytogenetics and molecular testing. -I recommend him to start ESA in the hospital, giving the severe anemia which has required frequent blood transfusion.  Aranesp 150 mcg today, and plan to continue every 2 weeks.  I will increase dose as needed -I discussed the benefit and the potential side effects from ESA, especially small risk of thrombosis.  He voiced good understanding and agrees to proceed. -Please continue blood transfusion to keep hemoglobin above 8.5, and back in office in 2 weeks.   Malachy Mood, MD 04/09/2023  5:11 PM

## 2023-04-09 NOTE — Progress Notes (Signed)
Heart Failure Stewardship Pharmacist Progress Note   PCP: [name], FNP PCP-Cardiologist: [name], MD    HPI:  82 yo M with PMH of CHF, aflutter, CAD s/p CABG x3 (2003) and subsequent PCI, HTN, HLD, moderate aortic stenosis, permanent AF on apixaban.   Admitted 09/2022 with acute on chronic HFpEF after failed outpatient management with increasing torsemide. BNP 288. CXR with pulmonary edema. EF 55-60% on ECHO. Started on IV lasix and GDMT optimized. R/LHC with 3 vessel CAD and volume overload recommending medical management. He was started on Farxiga and approved for patient assistance from [name]. He was discharged and had follow up with HF TOC clinic. ReDS 38% and some volume overload. Continued on torsemide 80 mg daily and instructed to take additional 20 mg PRN for weight gain.   Has had a few hospitalizations since for acute on chronic HF and GI bleeds / anemia.   Presented back to the ED on 7/28 with generalized weakness, shortness of breath, LE edema, and weeping leg. Transferred from [facility] to [facility] with GI bleed, hgb 6.2, FOBT +. Initially hypotensive requiring norepinephrine. GI and hematology consulted. Transfused with 2 units PRBC and hgb has been stable. GI signed off. Hematology ordering bone marrow biopsy - done 8/1.   ECHO 7/30 showed LVEF 30-35%, global hypokinesis, mild concentric LVH, RV mildly reduced, mild MR, mild AS.   Current HF Medications: Diuretic: furosemide 60 mg IV BID Beta Blocker: metoprolol tartrate 12.5 mg BID SGLT2i: Farxiga 10 mg daily  Prior to admission HF Medications: Diuretic: torsemide 40 mg BID Beta blocker: metoprolol tartrate 25 mg BID *was taking Farxiga 10 mg daily  Pertinent Lab Values: Serum creatinine 1.16, BUN 17, Potassium 3.5, Sodium 134, BNP 1121, Magnesium 2.0  Vital Signs: Weight: 196 lbs (admission weight: 189 lbs) Blood pressure: 90/50s  Heart rate: 80-90s  I/O: net -0.8L yesterday; net -4.6L since  admission  Medication Assistance / Insurance Benefits Check: Does the patient have prescription insurance?  Yes Type of insurance plan: [name]  Does the patient qualify for medication assistance through manufacturers or grants?   Yes Eligible grants and/or patient assistance programs: Farxiga Medication assistance applications in progress: none  Medication assistance applications approved: Farxiga Approved medication assistance renewals will be completed by: [facility]  Outpatient Pharmacy:  Prior to admission outpatient pharmacy[name] Is the patient willing to use [facility] TOC pharmacy at discharge? Yes Is the patient willing to transition their outpatient pharmacy to utilize a [facility] outpatient pharmacy?  No    Assessment: 1. Acute on chronic systolic CHF (LVEF 30-35%). NYHA class III symptoms. - Continue furosemide 60 mg IV BID. Strict I/Os and daily weights. Keep K>4 and Mg>2. - Continue metoprolol tartrate 12.5 mg BID, will need to consolidate to metoprolol XL on discharge - BP low, no MRA or ARB yet to allow for aggressive diuresis - Continue Farxiga 10 mg daily. He has been approved for patient assistance and receives this in the mail from [name]. Wife confirms he has this supply at home. He will not need a new refill of this medication at discharge.   Plan: 1) Medication changes recommended at this time: - Continue IV diuresis  2) Patient assistance: - Approved for Farxiga patient assistance - last delivery for 90 day supply sent on 01/25/23  3)  Education  - Patient has been educated on current HF medications and potential additions to HF medication regimen - Patient verbalizes understanding that over the next few months, these medication doses  may change and more medications may be added to optimize HF regimen - Patient has been educated on basic disease state pathophysiology and goals of therapy   [name], PharmD, BCPS Heart Failure Stewardship Pharmacist Phone  [phone]

## 2023-04-09 NOTE — Progress Notes (Signed)
Mobility Specialist Progress Note:   04/09/23 1019  Mobility  Activity Ambulated independently in hallway  Level of Assistance Standby assist, set-up cues, supervision of patient - no hands on  Assistive Device Crutches  Distance Ambulated (ft) 260 ft  Activity Response Tolerated well  Mobility Referral Yes  $Mobility charge 1 Mobility  Mobility Specialist Start Time (ACUTE ONLY) 0950  Mobility Specialist Stop Time (ACUTE ONLY) 1000  Mobility Specialist Time Calculation (min) (ACUTE ONLY) 10 min    Post Mobility: 108 HR   Pt received in doorway of room, proceeding to ambulated independently with leads off. Pt denied any feelings of pain or discomfort during ambulation, asymptomatic throughout. Pt left in chair with all needs met. RN present in room.    Leory Plowman  Mobility Specialist Please contact via [employer] office at [phone]

## 2023-04-09 NOTE — Plan of Care (Signed)

## 2023-04-09 NOTE — Progress Notes (Signed)
Rounding Note    Patient Name: Leonard Cisneros Date of Encounter: 04/09/2023  [facility] Cardiologist: [name], MD   Subjective   Recorded as net negative 180cc yesterday but had unmeasured UOP.  Net -4.6 L on admission.  BP 99/55 this morning.  Stable creatinine (1.10 > 1.06 > 1.16).  Hemoglobin improved (7.3 > 8.0) after 1 unit PRBCs.  Denies any chest pain or dyspnea  Inpatient Medications    Scheduled Meds:  sodium chloride   Intravenous Once   sodium chloride   Intravenous Once   acetaminophen  650 mg Oral Once   atorvastatin  80 mg Oral Daily   Chlorhexidine Gluconate Cloth  6 each Topical Daily   dapagliflozin propanediol  10 mg Oral Daily   diphenhydrAMINE  25 mg Oral Once   feeding supplement  1 Container Oral TID BM   furosemide  20 mg Intravenous Once   furosemide  60 mg Intravenous BID   metoprolol tartrate  12.5 mg Oral BID   multivitamin  1 tablet Oral QHS   pantoprazole  40 mg Oral BID   polyethylene glycol  17 g Oral BID   potassium chloride  40 mEq Oral Once   potassium chloride SA  20 mEq Oral Daily   simethicone  80 mg Oral QID   Continuous Infusions:  sodium chloride     PRN Meds: docusate sodium, melatonin, mouth rinse   Vital Signs    Vitals:   04/08/23 1912 04/08/23 2027 04/09/23 0337 04/09/23 0754  BP: 110/69 105/76 97/66 (!) 99/55  Pulse: 99 100 100 (!) 104  Resp: 19 18 18 20   Temp: 98.1 F (36.7 C) 97.8 F (36.6 C) 97.6 F (36.4 C) 97.9 F (36.6 C)  TempSrc: Oral Oral Oral Oral  SpO2: 93% 93% 93% 90%  Weight:   89.1 kg   Height:        Intake/Output Summary (Last 24 hours) at 04/09/2023 0820 Last data filed at 04/09/2023 0753 Gross per 24 hour  Intake 680.33 ml  Output 1100 ml  Net -419.67 ml      04/09/2023    3:37 AM 04/08/2023    5:23 AM 04/07/2023    5:00 AM  Last 3 Weights  Weight (lbs) 196 lb 6.9 oz 190 lb 0.6 oz 196 lb 10.4 oz  Weight (kg) 89.1 kg 86.2 kg 89.2 kg      Telemetry    Atrial  fibrillation with rates 80-100s- Personally Reviewed  ECG    No new ECG tracing today. - Personally Reviewed  Physical Exam   GEN: Elderly Caucasian male in no acute distress.   Neck: JVD elevated with patient sitting upright in chair. Cardiac: Irregularly irregular rhythm with normal rate. II/VI murmur noted.   Respiratory: No increased work of breathing. No significant wheezes, rhonchi, or rales appreciated. GI: Soft,, and non-tender. MS: 2+ pitting edema of bilaterally lower extremities. Right lower extremity is weeping and is wrapped in gauze. Skin: Warm and dry. Neuro:  No focal deficits. Psych: Normal affect. Responds appropriately.  Labs    High Sensitivity Troponin:   Recent Labs  Lab 03/31/23 1004 03/31/23 1416 03/31/23 2133 03/31/23 2354 04/01/23 0542  TROPONINIHS 2,150* 1,742* 2,178* 2,438* 2,427*     Chemistry Recent Labs  Lab 04/07/23 0210 04/08/23 0115 04/09/23 0400  NA 135 131* 134*  K 4.0 3.8 3.5  CL 100 96* 97*  CO2 28 26 28   GLUCOSE 96 93 99  BUN 14 16  17  CREATININE 1.10 1.06 1.16  CALCIUM 8.4* 8.1* 8.4*  MG 1.8 2.2 2.0  ALBUMIN  --  2.9*  --   GFRNONAA >60 >60 >60  ANIONGAP 7 9 9     Lipids No results for input(s): "CHOL", "TRIG", "HDL", "LABVLDL", "LDLCALC", "CHOLHDL" in the last 168 hours.  Hematology Recent Labs  Lab 04/07/23 0210 04/08/23 0115 04/09/23 0400  WBC 6.5 6.8 7.1  RBC 2.11* 2.19* 2.42*  HGB 7.3* 7.3* 8.0*  HCT 22.7* 23.3* 24.9*  MCV 107.6* 106.4* 102.9*  MCH 34.6* 33.3 33.1  MCHC 32.2 31.3 32.1  RDW 23.3* 23.5* 24.5*  PLT 168 195 190   Thyroid No results for input(s): "TSH", "FREET4" in the last 168 hours.  BNP No results for input(s): "BNP", "PROBNP" in the last 168 hours.   DDimer No results for input(s): "DDIMER" in the last 168 hours.   Radiology    No results found.  Cardiac Studies   Right/ Left Cardiac Catheterization 09/27/2022:   Ost LAD to Prox LAD lesion is 100% stenosed.   Ramus lesion is 100%  stenosed.   Prox RCA to Mid RCA lesion is 60% stenosed.   Dist RCA lesion is 90% stenosed.   Origin to Prox Graft lesion is 100% stenosed.   LIMA graft was visualized by angiography and is large.   RIMA graft was visualized by non-selective angiography and is normal in caliber.   The graft exhibits no disease.   The graft exhibits no disease.   LV end diastolic pressure is mildly elevated.   Hemodynamic findings consistent with mild pulmonary hypertension.   There is moderate aortic valve stenosis.   3 vessel occlusive CAD. The native LCx is widely patent LIMA to the LAD is patent RIMA to the PDA is patent SVG to the ramus intermediate is occluded. This appears to be chronic Mild to Moderate aortic stenosis. AV mean gradient 18.5 mm Hg. AVA 2.5 cm squared with index 1.15. Elevated LV filling pressures. LVEDP 20 mm Hg. Mean PCWP 24 mm Hg with large V waves to 42 mm Hg Mild pulmonary HTN. PAP 56/17 with mean 31 mm Hg.    Plan: medical management. Continue diuresis. Would benefit greatly from correction of severe anemia. Will need to monitor Aortic stenosis serially going forward.   Diagnostic Dominance: Right  _______________  Echocardiogram 04/02/2023: Impressions: 1. Left ventricular ejection fraction, by estimation, is 30 to 35%. The  left ventricle has moderately decreased function. The left ventricle  demonstrates global hypokinesis. The left ventricular internal cavity size  was mildly dilated. There is mild  concentric left ventricular hypertrophy. Left ventricular diastolic  parameters are indeterminate.   2. Right ventricular systolic function is mildly reduced. The right  ventricular size is mildly enlarged. Tricuspid regurgitation signal is  inadequate for assessing PA pressure.   3. Left atrial size was severely dilated.   4. Right atrial size was moderately dilated.   5. The mitral valve is normal in structure. Mild mitral valve  regurgitation. Mild mitral  stenosis. The mean mitral valve gradient is 6.0  mmHg. Moderate mitral annular calcification.   6. The aortic valve is tricuspid. There is moderate calcification of the  aortic valve. Aortic valve regurgitation is not visualized. Mild aortic  valve stenosis. Aortic valve mean gradient measures 14.0 mmHg.   7. The inferior vena cava is dilated in size with <50% respiratory  variability, suggesting right atrial pressure of 15 mmHg.    Patient Profile  82 y.o. male with a history of CAD s/p remote CABG x3 in 2003 with subsequent BMS to SVG-RI in 2017 (last cath in 09/2022 showed 2/3 patent grafts with occluded SVG-RI), ischemic cardiomyopathy/ chronic HFpEF with EF of 55-60% on Echo in 09/2022, permanent atrial fibrillation and paroxysmal atrial flutter not on anticoagulation given GI bleeds and anemia, aortic stenosis, hypertension, hyperlipidemia, chronic venous stasis dermatitis, and GI bleeding with recurrent symptomatic anemia who presented on 03/31/2023 with weakness and was noted to be hypotension with a hemoglobin of 6.2. He was admitted for decompensated CHF and recurrent anemia. He was started on pressors and transfused with PRBC. Echo showed a new drop in EF to 30-35%. Cardiology was consulted for assistance.  Assessment & Plan    Acute on Chronic HFrEF Patient has a history of chronic HFpEF with EF of 55-60% on Echo in 09/2022. He presented with generalized and was found to be in decompensated CHF and severely anemic. BNP elevated at 1,121. Chest x-ray showed cardiomegaly with moderate pulmonary vascular congestion and bilateral effusions compatible with CHF. Echo showed LVEF of 30-35% with global hypokinesis and mild LVH, mildly reduced RV, biatrial enlargement (left > right), mild AS, and mild MR/ MS.  - Still looks volume overloaded. + JVD - Continue IV Lasix 60mg  twice daily  - GDMT limited due to hypotension. On low dose Lopressor for rate control, will consolidate to toprol XL prior  to discharge.  Continue farxiga 10 mg daily - Continue to monitor daily weights, strict I/Os, and renal function. - He is not a candidate for cardiac catheterization with his severe anemia.   Demand Ischemia CAD s/p CABG S/p remote CABG in 2003 with subsequent PCI with BMS to SVG-RI. Last cath in 09/2022 showed an occluded SVG to RI but patent LIMA to LAD and RIMA to PDA. Continued medical therapy recommended. High-sensitivity troponin peaked at 2,438 but could represent demand ischemia from decompensated CHF and severe anemia. Echo showed a drop in EF to 30-35% with global hypokinesis.. - No chest pain. - Has not been on aspirin given severe anemia, would restart once Hgb stable - Continue beta-blocker and high-intensity statin. - Not a candidate for cardiac catheterization at this time.   Permanent Atrial Fibrillation Rates mostly in the 80s to low 100s. - Continue Lopressor 12.5mg  twice daily. - CHA2DS2-VASc = 5 (CAD, CHF, HTN, age x2). Not a candidate for anticoagulation with severe anemia and GI bleeding. He has had CTA to assess for Watchman and can continue to follow-up with Dr. Lalla Brothers as an outpatient.   Hypotension He was hypotensive on arrival and initially required Levophed in addition to blood transfusion. BP remains low-normal but is stable. - Continue low dose Lopressor as above.   Hyperlipidemia - Continue Lipitor 80mg  daily.   Acute on Chronic Anemia Hemoglobin 6.2 on admission.  Has been as low as 5.0 12/2022.  Hemoccult positive. Recent EGD on 02/15/2023 showed gastritis and a non-bleeding duodenal ulcer with a clean ulcer base. Recent colonoscopy on 03/13/2023 showed non-bleeding internal hemorrhoid and two 3-55mm polyps in the ascending colon and in the cecum which were removed with a cold snare. GI and Hematology consulted and anemia not felt to be due to GI bleeding. Reticulocyte count has been low indicating a bone marrow production issue. 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   AKI on CKD Stage  III Creatinine 1.94 on admission. Baseline around 0.9 to 1.2. Likely due to hypotension and decompensated CHF. Improved. - Creatinine stable at 1.16 today - Continue to monitor with diuresis.    For questions or updates, please contact [facility] Please consult [email or web address] for contact info under        Signed, Little Ishikawa, MD  04/09/2023, 8:20 AM

## 2023-04-09 NOTE — Progress Notes (Signed)
1 unit of blood completed, tolerated well. Right leg dressing saturated with fluid pale yellow in color. Dressing changed with xeroform applied , abd dressings and wrapped with kerlix.  Left leg foam dressing changed tolerated well.. continue to monitor.

## 2023-04-09 NOTE — Progress Notes (Signed)
PROGRESS NOTE    Leonard Cisneros  AVW:[id number] DOB: [birth year] DOA: 03/31/2023 PCP: [name], FNP    Chief Complaint  Patient presents with   Weakness    Brief Narrative:  82 year old man with increasing lethargy and dyspnea at home. Found to have hemoglobin of 6.2 in ED.  Transfusion started.  Hypotensive into the 80s requiring norepinephrine.  Transferred from [facility] to [facility] for ICU admission. Denies frank melena.  No vomiting. History of ischemic cardiomyopathy with triple-vessel disease.  Prior bypass 2003 . Medical management was recommended.  EF is normal.  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 Atrial fibrillation status post Watchman. History of chronic anemia with negative colonoscopy and EGD.  Ferritin has been normal in the past.  B12 and folate normal.     Assessment & Plan:   Principal Problem:   GIB (gastrointestinal bleeding) Active Problems:   History of duodenal ulcer   Anemia   AKI (acute kidney injury) (HCC)   Acute on chronic HFrEF (heart failure with reduced ejection fraction) (HCC)   Microcytic anemia   Demand ischemia   Atrial fibrillation (HCC)  #1 acute decompensated biventricular systolic heart failure -Patient noted to have required pressors of norepinephrine on admission with complaints of significant shortness of breath and noted to have a significant anemia. -Troponins obtained on admission elevated however seem to plateau at 2178>> 2438>>> 2427. -Patient status post transfusion of 3 units PRBC with hemoglobin currently at 8. -2D echo obtained during this hospitalization with a EF of 30 to 35%, left ventricular global hypokinesis, mild concentric LVH, severely dilated left atrial size, moderately dilated right atrial size, mild MVR, mild MS.  Mild aortic valve stenosis. -EF on recent 2D echo significantly reduced from prior EF of 55 to 60% from January 2024. -BP was soft however improving. -Was on  IV Lasix 40 mg every 12 hours, and dose adjusted to 60 mg IV every 12 hours per cardiology as patient still with significant volume overload on examination. -BP soft, patient with significant volume overload on examination and as such received IV albumin twice daily x 4 doses in addition to IV Lasix.   -Patient with urine output of 1.1 L over the past 24 hours.  Patient is -3.942 L during this hospitalization. -Patient being followed by cardiology who are recommending continuation of diuresis with Lasix and Lopressor and no need for pressors/ionotropes at this time. -Per cardiology would likely change Lopressor to Toprol-XL on discharge. -Per cardiology.  2.  CAD/CABG/demand ischemia -Patient noted to have cath in 09/27/2022 with patent RIMA/LIMA.  Patient denies any chest pain and at this point in time cardiology does not feel patient is a candidate for catheterization recommending medical management at this time. -Hemoglobin currently at 8.0 this morning. -Transfuse 1 unit PRBCs to keep hemoglobin > 8.  -Per cardiology.  3.  Severe symptomatic anemia -Patient presented with severe symptomatic anemia with shortness of breath, noted to have a hemoglobin of 6.2 on presentation. -Status post transfusion 3 units PRBCs. -Hemoglobin currently at 8.0. -FOBT noted to be positive. -Patient with a macrocytic anemia. -Patient seen in consultation by GI and patient noted to have a recent EGD 02/15/2023 that showed mild gastritis and a clean-based 3 mm duodenal ulcer with biopsies negative for H. pylori. -Patient noted to have a colonoscopy 03/13/2023 which showed nonbleeding internal hemorrhoids and 2 small tubular adenomas which were removed. -Anemia panel not consistent with iron deficiency anemia, -Patient seen in consultation by hematology  and bone marrow biopsy which was done on 04/04/2023 which showed a hypercellular bone marrow with mild dyspoiesis and increased ringed sideroblasts..  - hemoglobin  currently at 8.0 this morning.   -Transfuse 1 unit PRBCs.   -Goal hemoglobin > 8.  -Follow H&H. -GI was following but have signed off as of 04/03/2023. -Will need outpatient follow-up with hematology/oncology.  4.  Permanent A-fib status post ablation -Continue Lopressor for rate control.  -Not a anticoagulation candidate due to recurrent anemias and concern for prior history of GI bleed. -Per cardiology.  5.  Hyperlipidemia -Statin.   6.  Demand ischemia -Felt likely secondary to anemia in the setting of history of CAD status post CABG. -Troponins on admission noted at 2150 seem to have plateaued and peaked at 2438. -Patient denies any angina/chest pain. -Keep hemoglobin > 8. -Continue current cardiac regimen of statin, beta-blocker, diuretics.  Aspirin on hold in the setting of possible GI bleed/anemia.  7.  Chronic venous stasis dermatitis -Continue current local wound care.   8.  AKI -Likely secondary to prerenal azotemia in the setting of decompensated CHF, presentation with hypotension. -Renal function improved with transfusion of PRBC and diuresis. -Urine output of 1.1 L over the past 24 hours.  Also unmeasured urine noted x 2.  -Renal function improving daily. -Monitor renal function with diuresis. -Follow.  9.  Hypokalemia -Secondary to diuresis. -Potassium at 3.5.   -Kdur 40 mEq p.o. x 1.   -Continue daily potassium oral supplementation.    DVT prophylaxis: SCDs. Code Status: Full Family Communication: Updated patient.  No family at bedside.  Disposition: Likely home when clinically improved and euvolemic, labs stable, cleared by cardiology.  Status is: Inpatient Remains inpatient appropriate because: Severity of illness   Consultants:  IR: Alwyn Ren, NP 04/03/2023 Oncology: Dr. Mosetta Putt 04/02/2023 Cardiology: Dr. Excell Seltzer 04/02/2023 Gastroenterology: Dr. Leonides Schanz 04/01/2023  Procedures:  Transfusion 2 units PRBC 03/31/2023 Chest x-ray 03/31/2023 2D echo  04/02/2023 CT bone marrow biopsy and aspiration per IR: Dr. Fredia Sorrow 04/04/2023 Transfusion 1 unit PRBCs 04/08/2023 Transfusion 1 unit PRBCs 04/09/2023 pending  Antimicrobials:  Anti-infectives (From admission, onward)    Start     Dose/Rate Route Frequency Ordered Stop   03/31/23 1000  cefTRIAXone (ROCEPHIN) 2 g in sodium chloride 0.9 % 100 mL IVPB  Status:  Discontinued        2 g 200 mL/hr over 30 Minutes Intravenous Every 24 hours 03/31/23 0949 03/31/23 1530   03/31/23 1000  azithromycin (ZITHROMAX) 500 mg in sodium chloride 0.9 % 250 mL IVPB  Status:  Discontinued        500 mg 250 mL/hr over 60 Minutes Intravenous Every 24 hours 03/31/23 0949 03/31/23 1530         Subjective: Sitting up in recliner receiving a unit of PRBCs.  Denies any significant shortness of breath or chest pain.  No bloody BMs.  Denies any lightheadedness or dizziness.  Patient with good urine output per RN.   Objective: Vitals:   04/09/23 0754 04/09/23 0800 04/09/23 1005 04/09/23 1016  BP: (!) 99/55  109/62 109/62  Pulse: (!) 104  97 98  Resp: 20 20    Temp: 97.9 F (36.6 C)   (!) 97.3 F (36.3 C)  TempSrc: Oral   Oral  SpO2: 90% 92%  92%  Weight:      Height:        Intake/Output Summary (Last 24 hours) at 04/09/2023 1055 Last data filed at 04/09/2023 0753 Gross per 24 hour  Intake 680.33 ml  Output 600 ml  Net 80.33 ml   Filed Weights   04/07/23 0500 04/08/23 0523 04/09/23 0337  Weight: 89.2 kg 86.2 kg 89.1 kg    Examination:  General exam: Appears calm and comfortable  Respiratory system: Decreased bibasilar crackles.  No wheezing.  Fair air movement.  Speaking in full sentences.  Cardiovascular system: Irregularly irregular with 2/6 SEM.  Positive JVD.  3+ bilateral lower extremity edema to hips.  Chronic venous stasis changes of lower extremity noted.   Gastrointestinal system: Abdomen soft, mildly distended, positive bowel sounds.  No rebound.  No guarding.  Central nervous system: Alert  and oriented. No focal neurological deficits. Extremities: 3+ bilateral lower extremity edema up to hips, with chronic venous stasis changes noted.  Symmetric 5 x 5 power. Skin: No rashes, lesions or ulcers Psychiatry: Judgement and insight appear normal. Mood & affect appropriate.     Data Reviewed: I have personally reviewed following labs and imaging studies  CBC: Recent Labs  Lab 04/04/23 0308 04/05/23 0153 04/06/23 0133 04/06/23 1547 04/07/23 0210 04/08/23 0115 04/09/23 0400  WBC 6.9 8.1 6.2  --  6.5 6.8 7.1  NEUTROABS 4.4  --   --   --   --   --   --   HGB 8.3* 8.8* 7.6* 7.6* 7.3* 7.3* 8.0*  HCT 25.5* 27.1* 23.3* 23.6* 22.7* 23.3* 24.9*  MCV 108.1* 108.4* 108.9*  --  107.6* 106.4* 102.9*  PLT 180 206 177  --  168 195 190    Basic Metabolic Panel: Recent Labs  Lab 04/05/23 0153 04/06/23 0133 04/07/23 0210 04/08/23 0115 04/09/23 0400  NA 135 133* 135 131* 134*  K 3.8 3.7 4.0 3.8 3.5  CL 102 97* 100 96* 97*  CO2 26 27 28 26 28   GLUCOSE 97 103* 96 93 99  BUN 10 12 14 16 17   CREATININE 1.07 1.03 1.10 1.06 1.16  CALCIUM 7.7* 8.1* 8.4* 8.1* 8.4*  MG 2.0 1.9 1.8 2.2 2.0  PHOS  --   --   --  3.9  --     GFR: Estimated Creatinine Clearance: 53.2 mL/min (by C-G formula based on SCr of 1.16 mg/dL).  Liver Function Tests: Recent Labs  Lab 04/08/23 0115  ALBUMIN 2.9*     CBG: No results for input(s): "GLUCAP" in the last 168 hours.    Recent Results (from the past 240 hour(s))  Blood Culture (routine x 2)     Status: None   Collection Time: 03/31/23 10:04 AM   Specimen: Right Antecubital; Blood  Result Value Ref Range Status   Specimen Description   Final    RIGHT ANTECUBITAL BOTTLES DRAWN AEROBIC AND ANAEROBIC   Special Requests Blood Culture adequate volume  Final   Culture   Final    NO GROWTH 5 DAYS Performed at [facility], [address], [place], [place] [address]    Report Status 04/05/2023 FINAL  Final  Blood Culture (routine x 2)      Status: None   Collection Time: 03/31/23 10:04 AM   Specimen: Left Antecubital; Blood  Result Value Ref Range Status   Specimen Description   Final    LEFT ANTECUBITAL BOTTLES DRAWN AEROBIC AND ANAEROBIC   Special Requests   Final    Blood Culture results may not be optimal due to an excessive volume of blood received in culture bottles   Culture   Final    NO GROWTH 5 DAYS Performed at [facility], [address] [place], [place] [address]    Report Status 04/05/2023 FINAL  Final  Resp panel by RT-PCR (RSV, Flu A&B, Covid) Anterior Nasal Swab     Status: None   Collection Time: 03/31/23 10:35 AM   Specimen: Anterior Nasal Swab  Result Value Ref Range Status   SARS Coronavirus 2 by RT PCR NEGATIVE NEGATIVE Final    Comment: (NOTE) SARS-CoV-2 target nucleic acids are NOT DETECTED.  The SARS-CoV-2 RNA is generally detectable in upper respiratory specimens during the acute phase of infection. The lowest concentration of SARS-CoV-2 viral copies this assay can detect is 138 copies/mL. A negative result does not preclude SARS-Cov-2 infection and should not be used as the sole basis for treatment or other patient management decisions. A negative result may occur with  improper specimen collection/handling, submission of specimen other than nasopharyngeal swab, presence of viral mutation(s) within the areas targeted by this assay, and inadequate number of viral copies(<138 copies/mL). A negative result must be combined with clinical observations, patient history, and epidemiological information. The expected result is Negative.  Fact Sheet for Patients:  BloggerCourse.com  Fact Sheet for Healthcare Providers:  SeriousBroker.it  This test is no t yet approved or cleared by the [place] FDA and  has been authorized for detection and/or diagnosis of SARS-CoV-2 by FDA under an Emergency Use Authorization (EUA). This EUA will remain   in effect (meaning this test can be used) for the duration of the COVID-19 declaration under Section 564(b)(1) of the Act, 21 U.S.C.section 360bbb-3(b)(1), unless the authorization is terminated  or revoked sooner.       Influenza A by PCR NEGATIVE NEGATIVE Final   Influenza B by PCR NEGATIVE NEGATIVE Final    Comment: (NOTE) The Xpert Xpress SARS-CoV-2/FLU/RSV plus assay is intended as an aid in the diagnosis of influenza from Nasopharyngeal swab specimens and should not be used as a sole basis for treatment. Nasal washings and aspirates are unacceptable for Xpert Xpress SARS-CoV-2/FLU/RSV testing.  Fact Sheet for Patients: BloggerCourse.com  Fact Sheet for Healthcare Providers: SeriousBroker.it  This test is not yet approved or cleared by the [place] FDA and has been authorized for detection and/or diagnosis of SARS-CoV-2 by FDA under an Emergency Use Authorization (EUA). This EUA will remain in effect (meaning this test can be used) for the duration of the COVID-19 declaration under Section 564(b)(1) of the Act, 21 U.S.C. section 360bbb-3(b)(1), unless the authorization is terminated or revoked.     Resp Syncytial Virus by PCR NEGATIVE NEGATIVE Final    Comment: (NOTE) Fact Sheet for Patients: BloggerCourse.com  Fact Sheet for Healthcare Providers: SeriousBroker.it  This test is not yet approved or cleared by the [place] FDA and has been authorized for detection and/or diagnosis of SARS-CoV-2 by FDA under an Emergency Use Authorization (EUA). This EUA will remain in effect (meaning this test can be used) for the duration of the COVID-19 declaration under Section 564(b)(1) of the Act, 21 U.S.C. section 360bbb-3(b)(1), unless the authorization is terminated or revoked.  Performed at [facility], [address] [place], [place] [address]   MRSA Next Gen by  PCR, Nasal     Status: None   Collection Time: 03/31/23  4:22 PM   Specimen: Nasal Mucosa; Nasal Swab  Result Value Ref Range Status   MRSA by PCR Next Gen NOT DETECTED NOT DETECTED Final    Comment: (NOTE) The GeneXpert MRSA Assay (FDA approved for NASAL specimens only), is one component of a  comprehensive MRSA colonization surveillance program. It is not intended to diagnose MRSA infection nor to guide or monitor treatment for MRSA infections. Test performance is not FDA approved in patients less than 92 years old. Performed at [facility], [address] [place], [place] [address]          Radiology Studies: No results found.      Scheduled Meds:  sodium chloride   Intravenous Once   atorvastatin  80 mg Oral Daily   Chlorhexidine Gluconate Cloth  6 each Topical Daily   dapagliflozin propanediol  10 mg Oral Daily   feeding supplement  1 Container Oral TID BM   furosemide  20 mg Intravenous Once   furosemide  60 mg Intravenous BID   metoprolol tartrate  12.5 mg Oral BID   multivitamin  1 tablet Oral QHS   pantoprazole  40 mg Oral BID   polyethylene glycol  17 g Oral BID   potassium chloride SA  20 mEq Oral Daily   simethicone  80 mg Oral QID   Continuous Infusions:  sodium chloride       LOS: 9 days    Time spent: 35 minutes    Ramiro Harvest, MD [facility]   To contact the attending provider between 7A-7P or the covering provider during after hours 7P-7A, please log into the web site [email or web address] and access using universal D[place] password for that web site. If you do not have the password, please call the hospital operator.  04/09/2023, 10:55 AM

## 2023-04-10 ENCOUNTER — Ambulatory Visit: Payer: Medicare HMO | Admitting: Gastroenterology

## 2023-04-10 DIAGNOSIS — I2583 Coronary atherosclerosis due to lipid rich plaque: Secondary | ICD-10-CM

## 2023-04-10 DIAGNOSIS — I2489 Other forms of acute ischemic heart disease: Secondary | ICD-10-CM | POA: Diagnosis not present

## 2023-04-10 DIAGNOSIS — I2510 Atherosclerotic heart disease of native coronary artery without angina pectoris: Secondary | ICD-10-CM

## 2023-04-10 DIAGNOSIS — D469 Myelodysplastic syndrome, unspecified: Secondary | ICD-10-CM | POA: Diagnosis not present

## 2023-04-10 DIAGNOSIS — I5023 Acute on chronic systolic (congestive) heart failure: Secondary | ICD-10-CM | POA: Diagnosis not present

## 2023-04-10 DIAGNOSIS — D649 Anemia, unspecified: Secondary | ICD-10-CM | POA: Diagnosis not present

## 2023-04-10 DIAGNOSIS — N179 Acute kidney failure, unspecified: Secondary | ICD-10-CM | POA: Diagnosis not present

## 2023-04-10 DIAGNOSIS — I4821 Permanent atrial fibrillation: Secondary | ICD-10-CM | POA: Diagnosis not present

## 2023-04-10 MED ORDER — POTASSIUM CHLORIDE CRYS ER 20 MEQ PO TBCR
40.0000 meq | EXTENDED_RELEASE_TABLET | Freq: Once | ORAL | Status: AC
  Administered 2023-04-10: 40 meq via ORAL
  Filled 2023-04-10: qty 2

## 2023-04-10 MED ORDER — MAGNESIUM SULFATE 2 GM/50ML IV SOLN
2.0000 g | Freq: Once | INTRAVENOUS | Status: AC
  Administered 2023-04-10: 2 g via INTRAVENOUS
  Filled 2023-04-10: qty 50

## 2023-04-10 NOTE — Progress Notes (Signed)
Physical Therapy Treatment Patient Details Name: ERSKIN FEDDER MRN: [id number] DOB: [birth year] Today's Date: 04/10/2023   History of Present Illness 82 year old man brought to ED on 7/28 with increasing lethargy and dyspnea at home  Found to have hemoglobin of 6.2, transfusion started. Pt hypotensive. PMH: ischemic cardiomyopathy with triple-vessel disease, s/p bypass 2003. CAD, HTN, CAD, CHF, LE sores, R worse than L.    PT Comments  Pt received sitting in the recliner and agreeable to session with wife present. Pt demonstrating slightly improved activity tolerance and is making good progress towards functional mobility goals. Pt able to tolerate gait trial, however is unable to improve upright posture due to chronic back problems per pt report and pt becomes agitated with cues for improved technique. Pt able to stand from recliner x5 for BLE strengthening. Pt encouraged to stand with single UE support, however pt stating "I can't". Pt attempts with max encouragement and is able to perform with min A progressing to CGA. Pt demonstrating increased instability in static standing without AD. Pt continues to benefit from PT services to progress toward functional mobility goals.     If plan is discharge home, recommend the following: A little help with walking and/or transfers;A little help with bathing/dressing/bathroom;Assist for transportation;Help with stairs or ramp for entrance   Can travel by private vehicle        Equipment Recommendations  None recommended by PT    Recommendations for Other Services       Precautions / Restrictions Precautions Precautions: Fall Precaution Comments: R lower leg dressing with noted oozing/discharge Restrictions Weight Bearing Restrictions: No     Mobility  Bed Mobility               General bed mobility comments: Pt beginning and ending session in recliner    Transfers Overall transfer level: Modified independent Equipment used:  None Transfers: Sit to/from Stand Sit to Stand: Contact guard assist, Min assist           General transfer comment: STS from recliner without AD x6. Pt requires CGA with BUE support for power up and min A with single UE support for power up progressing to CGA with single UE support on final trial.    Ambulation/Gait Ambulation/Gait assistance: Supervision Gait Distance (Feet): 260 Feet Assistive device: Crutches Gait Pattern/deviations: Step-through pattern, Decreased stride length, Trunk flexed       General Gait Details: Pt demonstrating slow step-through pattern with crutches placed wide and pt's trunk flexed with crutches resting on armpits. Cues for upright posture and forward gaze, however pt reporting chronic back problems preventing upright posture.       Balance Overall balance assessment: Needs assistance Sitting-balance support: Feet supported, No upper extremity supported Sitting balance-Leahy Scale: Good Sitting balance - Comments: sitting in recliner   Standing balance support: During functional activity, Bilateral upper extremity supported, Single extremity supported Standing balance-Leahy Scale: Fair Standing balance comment: with crutches support. Pt demonstrating instability in static standing without AD                            Cognition Arousal: Alert Behavior During Therapy: WFL for tasks assessed/performed Overall Cognitive Status: Within Functional Limits for tasks assessed  Exercises General Exercises - Lower Extremity Ankle Circles/Pumps: AROM, Seated, Both, 5 reps Long Arc Quad: AROM, Seated, Both, 5 reps    General Comments        Pertinent Vitals/Pain Pain Assessment Pain Assessment: No/denies pain     PT Goals (current goals can now be found in the care plan section) Acute Rehab PT Goals Patient Stated Goal: home PT Goal Formulation: With patient/family Time  For Goal Achievement: 04/16/23 Potential to Achieve Goals: Good Progress towards PT goals: Progressing toward goals    Frequency    Min 1X/week      PT Plan Current plan remains appropriate       AM-PAC PT "6 Clicks" Mobility   Outcome Measure  Help needed turning from your back to your side while in a flat bed without using bedrails?: None Help needed moving from lying on your back to sitting on the side of a flat bed without using bedrails?: None Help needed moving to and from a bed to a chair (including a wheelchair)?: None Help needed standing up from a chair using your arms (e.g., wheelchair or bedside chair)?: None Help needed to walk in hospital room?: A Little Help needed climbing 3-5 steps with a railing? : A Lot 6 Click Score: 21    End of Session Equipment Utilized During Treatment: Gait belt Activity Tolerance: Patient tolerated treatment well Patient left: in chair;with call bell/phone within reach;with family/visitor present Nurse Communication: Mobility status PT Visit Diagnosis: Unsteadiness on feet (R26.81);Muscle weakness (generalized) (M62.81)     Time: 1610-9604 PT Time Calculation (min) (ACUTE ONLY): 21 min  Charges:    $Gait Training: 8-22 mins PT General Charges $$ ACUTE PT VISIT: 1 Visit                    Johny Shock, PTA Acute Rehabilitation Services Secure Chat Preferred  [phone]    Johny Shock 04/10/2023, 1:50 PM

## 2023-04-10 NOTE — Progress Notes (Signed)
Heart Failure Stewardship Pharmacist Progress Note   PCP: [name], FNP PCP-Cardiologist: [name], MD    HPI:  82 yo M with PMH of CHF, aflutter, CAD s/p CABG x3 (2003) and subsequent PCI, HTN, HLD, moderate aortic stenosis, permanent AF on apixaban.   Admitted 09/2022 with acute on chronic HFpEF after failed outpatient management with increasing torsemide. BNP 288. CXR with pulmonary edema. EF 55-60% on ECHO. Started on IV lasix and GDMT optimized. R/LHC with 3 vessel CAD and volume overload recommending medical management. He was started on Farxiga and approved for patient assistance from [name]. He was discharged and had follow up with HF TOC clinic. ReDS 38% and some volume overload. Continued on torsemide 80 mg daily and instructed to take additional 20 mg PRN for weight gain.   Has had a few hospitalizations since for acute on chronic HF and GI bleeds / anemia.   Presented back to the ED on 7/28 with generalized weakness, shortness of breath, LE edema, and weeping leg. [facility] to [name] with GI bleed, hgb 6.2, FOBT +. Initially hypotensive requiring norepinephrine. GI and hematology consulted. Transfused with 2 units PRBC and hgb has been stable. GI signed off. Hematology ordering bone marrow biopsy - done 8/1.   ECHO 7/30 showed LVEF 30-35%, global hypokinesis, mild concentric LVH, RV mildly reduced, mild MR, mild AS.   Current HF Medications: Diuretic: furosemide 60 mg IV BID Beta Blocker: metoprolol tartrate 12.5 mg BID SGLT2i: Farxiga 10 mg daily  Prior to admission HF Medications: Diuretic: torsemide 40 mg BID Beta blocker: metoprolol tartrate 25 mg BID *was taking Farxiga 10 mg daily  Pertinent Lab Values: Serum creatinine 1.00, BUN 22, Potassium 3.6, Sodium 133, BNP 1121, Magnesium 1.9  Vital Signs: Weight: 196 lbs (admission weight: 189 lbs) Blood pressure: 100/60s  Heart rate: 70-80s I/O: net +0.9L yesterday; net -3.8L since  admission  Medication Assistance / Insurance Benefits Check: Does the patient have prescription insurance?  Yes Type of insurance plan: [name]  Does the patient qualify for medication assistance through manufacturers or grants?   Yes Eligible grants and/or patient assistance programs: Farxiga Medication assistance applications in progress: none  Medication assistance applications approved: Farxiga Approved medication assistance renewals will be completed by: [name]  Outpatient Pharmacy:  Prior to admission [name] Is the patient willing to use [facility] TOC pharmacy at discharge? Yes Is the patient willing to transition their outpatient pharmacy to utilize a [name] outpatient pharmacy?  No    Assessment: 1. Acute on chronic systolic CHF (LVEF 30-35%). NYHA class III symptoms. - Continue furosemide 60 mg IV BID. Strict I/Os and daily weights. Keep K>4 and Mg>2. - Continue metoprolol tartrate 12.5 mg BID, will need to consolidate to metoprolol XL on discharge - BP low, no MRA or ARB yet to allow for aggressive diuresis - Continue Farxiga 10 mg daily. He has been approved for patient assistance and receives this in the mail from [name]. Wife confirms he has this supply at home. He will not need a new refill of this medication at discharge.   Plan: 1) Medication changes recommended at this time: - Continue IV diuresis  2) Patient assistance: - Approved for Farxiga patient assistance - last delivery for 90 day supply sent on 01/25/23  3)  Education  - Patient has been educated on current HF medications and potential additions to HF medication regimen - Patient verbalizes understanding that over the next few months, these medication doses may  change and more medications may be added to optimize HF regimen - Patient has been educated on basic disease state pathophysiology and goals of therapy   [name], PharmD, BCPS Heart Failure Stewardship Pharmacist Phone  [phone]

## 2023-04-10 NOTE — Assessment & Plan Note (Addendum)
Hypokalemia. Hyponatremia.  Volume status has improved,. Renal function with serum cr at 1,16 with K at 3,4 and serum bicarbonate at 26 Na 131 Mg 2.1   Continue K correction with Kcl Follow up renal function in am.

## 2023-04-10 NOTE — Assessment & Plan Note (Signed)
Continue reate control with metoprolol. Holding on anticoagulation due to severe anemia.

## 2023-04-10 NOTE — Progress Notes (Signed)
Rounding Note    Patient Name: Leonard Cisneros Date of Encounter: 04/10/2023  [facility] Cardiologist: [name], MD   Subjective   Recorded as net positive 924cc yesterday but had unmeasured UOP.  Net -4.6 L on admission.  BP 114/98 this morning.  Stable creatinine (1.10 > 1.06 > 1.16>1.0).  Hemoglobin improved (7.3 > 8.0 >8.6), received 1 unit PRBCs yesterday.  Denies any chest pain or dyspnea  Inpatient Medications    Scheduled Meds:  sodium chloride   Intravenous Once   atorvastatin  80 mg Oral Daily   Chlorhexidine Gluconate Cloth  6 each Topical Daily   dapagliflozin propanediol  10 mg Oral Daily   feeding supplement  1 Container Oral TID BM   furosemide  60 mg Intravenous BID   metoprolol tartrate  12.5 mg Oral BID   multivitamin  1 tablet Oral QHS   pantoprazole  40 mg Oral BID   polyethylene glycol  17 g Oral BID   potassium chloride SA  20 mEq Oral Daily   simethicone  80 mg Oral QID   Continuous Infusions:  sodium chloride     PRN Meds: docusate sodium, melatonin, mouth rinse   Vital Signs    Vitals:   04/09/23 1923 04/09/23 2307 04/10/23 0433 04/10/23 0728  BP: (!) 101/56 (!) 95/52 100/68 (!) 114/98  Pulse: 95 78 96   Resp: 20 20 16 15   Temp: 98.1 F (36.7 C) 97.6 F (36.4 C) 97.6 F (36.4 C) 97.7 F (36.5 C)  TempSrc: Oral Oral Oral Oral  SpO2: 93% 94% 93% 96%  Weight:   89.1 kg   Height:        Intake/Output Summary (Last 24 hours) at 04/10/2023 0825 Last data filed at 04/10/2023 0435 Gross per 24 hour  Intake 1954 ml  Output 1150 ml  Net 804 ml      04/10/2023    4:33 AM 04/09/2023    3:37 AM 04/08/2023    5:23 AM  Last 3 Weights  Weight (lbs) 196 lb 6.9 oz 196 lb 6.9 oz 190 lb 0.6 oz  Weight (kg) 89.1 kg 89.1 kg 86.2 kg      Telemetry    Atrial fibrillation with rates 70-100s, NSVT x 4 beats- Personally Reviewed  ECG    No new ECG tracing today. - Personally Reviewed  Physical Exam   GEN: Elderly Caucasian male  in no acute distress.   Neck: JVD elevated with patient sitting upright in chair. Cardiac: Irregularly irregular rhythm with normal rate. II/VI murmur noted.   Respiratory: No increased work of breathing. No significant wheezes, rhonchi, or rales appreciated. GI: Soft,, and non-tender. MS: 2+ pitting edema of bilaterally lower extremities. Right lower extremity is weeping and is wrapped in gauze. Skin: Warm and dry. Neuro:  No focal deficits. Psych: Normal affect. Responds appropriately.  Labs    High Sensitivity Troponin:   Recent Labs  Lab 03/31/23 1004 03/31/23 1416 03/31/23 2133 03/31/23 2354 04/01/23 0542  TROPONINIHS 2,150* 1,742* 2,178* 2,438* 2,427*     Chemistry Recent Labs  Lab 04/08/23 0115 04/09/23 0400 04/10/23 0312  NA 131* 134* 133*  K 3.8 3.5 3.6  CL 96* 97* 93*  CO2 26 28 25   GLUCOSE 93 99 89  BUN 16 17 22   CREATININE 1.06 1.16 1.00  CALCIUM 8.1* 8.4* 8.6*  MG 2.2 2.0 1.9  ALBUMIN 2.9*  --   --   GFRNONAA >60 >60 >60  ANIONGAP 9 9  15    Lipids No results for input(s): "CHOL", "TRIG", "HDL", "LABVLDL", "LDLCALC", "CHOLHDL" in the last 168 hours.  Hematology Recent Labs  Lab 04/08/23 0115 04/09/23 0400 04/09/23 1601 04/10/23 0312  WBC 6.8 7.1  --  6.3  RBC 2.19* 2.42*  --  2.58*  HGB 7.3* 8.0* 8.2* 8.6*  HCT 23.3* 24.9* 25.9* 26.6*  MCV 106.4* 102.9*  --  103.1*  MCH 33.3 33.1  --  33.3  MCHC 31.3 32.1  --  32.3  RDW 23.5* 24.5*  --  23.3*  PLT 195 190  --  177   Thyroid No results for input(s): "TSH", "FREET4" in the last 168 hours.  BNP No results for input(s): "BNP", "PROBNP" in the last 168 hours.   DDimer No results for input(s): "DDIMER" in the last 168 hours.   Radiology    No results found.  Cardiac Studies   Right/ Left Cardiac Catheterization 09/27/2022:   Ost LAD to Prox LAD lesion is 100% stenosed.   Ramus lesion is 100% stenosed.   Prox RCA to Mid RCA lesion is 60% stenosed.   Dist RCA lesion is 90% stenosed.    Origin to Prox Graft lesion is 100% stenosed.   LIMA graft was visualized by angiography and is large.   RIMA graft was visualized by non-selective angiography and is normal in caliber.   The graft exhibits no disease.   The graft exhibits no disease.   LV end diastolic pressure is mildly elevated.   Hemodynamic findings consistent with mild pulmonary hypertension.   There is moderate aortic valve stenosis.   3 vessel occlusive CAD. The native LCx is widely patent LIMA to the LAD is patent RIMA to the PDA is patent SVG to the ramus intermediate is occluded. This appears to be chronic Mild to Moderate aortic stenosis. AV mean gradient 18.5 mm Hg. AVA 2.5 cm squared with index 1.15. Elevated LV filling pressures. LVEDP 20 mm Hg. Mean PCWP 24 mm Hg with large V waves to 42 mm Hg Mild pulmonary HTN. PAP 56/17 with mean 31 mm Hg.    Plan: medical management. Continue diuresis. Would benefit greatly from correction of severe anemia. Will need to monitor Aortic stenosis serially going forward.   Diagnostic Dominance: Right  _______________  Echocardiogram 04/02/2023: Impressions: 1. Left ventricular ejection fraction, by estimation, is 30 to 35%. The  left ventricle has moderately decreased function. The left ventricle  demonstrates global hypokinesis. The left ventricular internal cavity size  was mildly dilated. There is mild  concentric left ventricular hypertrophy. Left ventricular diastolic  parameters are indeterminate.   2. Right ventricular systolic function is mildly reduced. The right  ventricular size is mildly enlarged. Tricuspid regurgitation signal is  inadequate for assessing PA pressure.   3. Left atrial size was severely dilated.   4. Right atrial size was moderately dilated.   5. The mitral valve is normal in structure. Mild mitral valve  regurgitation. Mild mitral stenosis. The mean mitral valve gradient is 6.0  mmHg. Moderate mitral annular calcification.   6. The  aortic valve is tricuspid. There is moderate calcification of the  aortic valve. Aortic valve regurgitation is not visualized. Mild aortic  valve stenosis. Aortic valve mean gradient measures 14.0 mmHg.   7. The inferior vena cava is dilated in size with <50% respiratory  variability, suggesting right atrial pressure of 15 mmHg.    Patient Profile     82 y.o. male with a history of CAD s/p  remote CABG x3 in 2003 with subsequent BMS to SVG-RI in 2017 (last cath in 09/2022 showed 2/3 patent grafts with occluded SVG-RI), ischemic cardiomyopathy/ chronic HFpEF with EF of 55-60% on Echo in 09/2022, permanent atrial fibrillation and paroxysmal atrial flutter not on anticoagulation given GI bleeds and anemia, aortic stenosis, hypertension, hyperlipidemia, chronic venous stasis dermatitis, and GI bleeding with recurrent symptomatic anemia who presented on 03/31/2023 with weakness and was noted to be hypotension with a hemoglobin of 6.2. He was admitted for decompensated CHF and recurrent anemia. He was started on pressors and transfused with PRBC. Echo showed a new drop in EF to 30-35%. Cardiology was consulted for assistance.  Assessment & Plan    Acute on Chronic HFrEF Patient has a history of chronic HFpEF with EF of 55-60% on Echo in 09/2022. He presented with generalized and was found to be in decompensated CHF and severely anemic. BNP elevated at 1,121. Chest x-ray showed cardiomegaly with moderate pulmonary vascular congestion and bilateral effusions compatible with CHF. Echo showed LVEF of 30-35% with global hypokinesis and mild LVH, mildly reduced RV, biatrial enlargement (left > right), mild AS, and mild MR/ MS.  - Still looks volume overloaded. + JVD - Continue IV Lasix 60mg  twice daily  - GDMT limited due to hypotension. On low dose Lopressor for rate control, will consolidate to toprol XL prior to discharge.  Continue farxiga 10 mg daily - Continue to monitor daily weights, strict I/Os, and renal  function. - He is not a candidate for cardiac catheterization with his severe anemia.   Demand Ischemia CAD s/p CABG S/p remote CABG in 2003 with subsequent PCI with BMS to SVG-RI. Last cath in 09/2022 showed an occluded SVG to RI but patent LIMA to LAD and RIMA to PDA. Continued medical therapy recommended. High-sensitivity troponin peaked at 2,438 but could represent demand ischemia from decompensated CHF and severe anemia. Echo showed a drop in EF to 30-35% with global hypokinesis.. - No chest pain. - Has not been on aspirin given severe anemia, would restart if OK with hematology once Hgb stable - Continue beta-blocker and high-intensity statin. - Not a candidate for cardiac catheterization at this time.   Permanent Atrial Fibrillation Rates mostly in the 80s to low 100s. - Continue Lopressor 12.5mg  twice daily. - CHA2DS2-VASc = 5 (CAD, CHF, HTN, age x2). Not a candidate for anticoagulation with severe anemia and GI bleeding. He has had CTA to assess for Watchman and can continue to follow-up with Dr. Lalla Brothers as an outpatient.   Hypotension He was hypotensive on arrival and initially required Levophed in addition to blood transfusion. BP remains low-normal but is stable. - Continue low dose Lopressor as above.   Hyperlipidemia - Continue Lipitor 80mg  daily.   Acute on Chronic Anemia Hemoglobin 6.2 on admission.  Has been as low as 5.0 12/2022.  Hemoccult positive. Recent EGD on 02/15/2023 showed gastritis and a non-bleeding duodenal ulcer with a clean ulcer base. Recent colonoscopy on 03/13/2023 showed non-bleeding internal hemorrhoid and two 3-29mm polyps in the ascending colon and in the cecum which were removed with a cold snare. GI and Hematology consulted and anemia not felt to be due to GI bleeding. Reticulocyte count has been low indicating a bone marrow production issue. S/p bone marrow biopsy on 8/1, consistent with MDS per hematology - Hemoglobin improved to 8.6 this morning after 1  unit PRBCs 8/5 and 1 unit PRBCs 8/5.  Starting on ESA per Heme - Management per Internal Medicine and  Hematology.   AKI on CKD Stage III Creatinine 1.94 on admission. Baseline around 0.9 to 1.2. Likely due to hypotension and decompensated CHF. Improved. - Creatinine stable at 1.00 today - Continue to monitor with diuresis.    For questions or updates, please contact [facility] Please consult [email or web address] for contact info under        Signed, Little Ishikawa, MD  04/10/2023, 8:25 AM

## 2023-04-10 NOTE — Progress Notes (Addendum)
Progress Note   Patient: Leonard Cisneros FAO:[id number] DOB: Nov 29, 1940 DOA: 03/31/2023     10 DOS: the patient was seen and examined on 04/10/2023   Brief hospital course: Leonard Cisneros was admitted with the working diagnosis of heart failure exacerbation in the setting of severe symptomatic anemia.   82 year old man with increasing lethargy and dyspnea at home. He was taken to [facility] ED where he was found to have hemoglobin of 6.2 along with hypotension with systolic pressure down into the 80s requiring norepinephrine.  Transferred from [facility] to [facility] for ICU admission. At the time of his transfer his blood pressure was 92/60, HR 78, RR 21 and 02 saturation 100%, lungs with no wheezing or rales, heart with S1 and S2 present and rhythmic, abdomen with no distention and positive lower extremity edema. Venous stasis ulcers to the level of the knee with open superficial ulcers.   Na 130, K 4,1 CL 98, bicarbonate 25 glucose 115 bun 38 cr 1,94  AST 64 ALT 41  BNP 1,121 High sensitive troponin 2,150, 1,742, 2,178, 2,438, 2,427   Lactic acid 3,1  Wbc 11.9 hgb 6,2 plt 191  Fecal occult positive   Chest radiograph with cardiomegaly, bilateral hilar vascular congestion, bilateral pleural effusions, more right than left. Sternotomy wires in place.   EKG 92 bpm, left axis deviation, normal intervals, atrial fibrillation rhythm with no significant ST segment changes, negative T wave lead V2 and V3.   Patient had PRBC transfusions.  Echocardiogram with reduced LV systolic function. 07/29 norepinephrine weaned off Placed on furosemide for diuresis.   07/31 transfer to [facility]   Assessment and Plan: Acute on chronic HFrEF (heart failure with reduced ejection fraction) (HCC) Echocardiogram with reduced LV systolic function with EF 30 to 35%, global hypokinesis, mild LVH, RV systolic function mildly reduced, RV cavity with mild enlargement, LA with severe dilatation, RA with moderate dilatation, no  significant valvular disease.   Urine output is 1,150 ml Systolic blood pressure 97 to 696 mmHg.   Plan to continue diuresis with IV furosemide 60 mg IV bid.  SGLT 2 inh to augment diuresis.  Continue low dose metoprolol.    Anemia Severe symptomatic anemia.  Sp PRBC transfusion x 4  History of mild gastritis and clean bases 3 mm duodenal ulcer. Recent colonoscopy with polyps (removed).  Positive iron deficiency anemia with bone marrow hypercellular (biopsy 07.10.24).   Atrial fibrillation (HCC) Continue reate control with metoprolol. Holding on anticoagulation due to severe anemia.  AKI (acute kidney injury) (HCC) Hypokalemia. Hyponatremia.  Renal function with serum cr at 1.0 with K at 3,6 and serum bicarbonate at 25.  Na 133. Mg 1,9   Add extra oral Kcl 40 meq and IV mag 2 g.  Follow up electrolytes in am.   Coronary artery disease SP CABG.  Troponin elevation due to demand ischemia.  Continue supportive medical therapy and keep Hgb at 8 or greater.   Dyslipidemia.  Continue with statin therapy.         Subjective: Patient with no chest pain or dyspnea, continue to have lower extremity edema   Physical Exam: Vitals:   04/09/23 2307 04/10/23 0433 04/10/23 0728 04/10/23 1118  BP: (!) 95/52 100/68 (!) 114/98 (!) 97/54  Pulse: 78 96    Resp: 20 16 15 18   Temp: 97.6 F (36.4 C) 97.6 F (36.4 C) 97.7 F (36.5 C) 97.8 F (36.6 C)  TempSrc: Oral Oral Oral Oral  SpO2: 94% 93% 96% 97%  Weight:  89.1 kg    Height:       Neurology awake and alert ENT with mild pallor with no icterus Cardiovascular with S1 and S2 present with no gallops or rubs, no murmurs Respiratory with mild rales at bases with no wheezing Abdomen with no distention  Positive lower extremity edema ++ pitting. Dressing in his right lower extremity  Data Reviewed:    Family Communication: no family at the bedside   Disposition: Status is: Inpatient Remains inpatient appropriate  because: diuresis   Planned Discharge Destination: Home      Author: Coralie Keens, MD 04/10/2023 4:21 PM  For on call review [email or web address].

## 2023-04-10 NOTE — Plan of Care (Signed)

## 2023-04-10 NOTE — Assessment & Plan Note (Addendum)
Echocardiogram with reduced LV systolic function with EF 30 to 35%, global hypokinesis, mild LVH, RV systolic function mildly reduced, RV cavity with mild enlargement, LA with severe dilatation, RA with moderate dilatation, no significant valvular disease.   Urine output is 2,300 ml Systolic blood pressure 93 to 829 mmHg.   Furosemide increased to 80 mg IV bid SGLT 2 inh to augment diuresis. 08/08 metolazone 2.5 mg   Continue low dose metoprolol.  Holding RAAS due to risk of hypotension.

## 2023-04-10 NOTE — Hospital Course (Addendum)
Leonard Cisneros was admitted with the working diagnosis of heart failure exacerbation in the setting of severe symptomatic anemia.   82 year old man with increasing lethargy and dyspnea at home. He was taken to [facility] ED where he was found to have hemoglobin of 6.2 along with hypotension with systolic pressure down into the 80s requiring norepinephrine.  Transferred from [facility] to [facility]. At the time of his transfer his blood pressure was 92/60, HR 78, RR 21 and 02 saturation 100%, lungs with no wheezing or rales, heart with S1 and S2 present and rhythmic, abdomen with no distention and positive lower extremity edema. Venous stasis ulcers to the level of the knee with open superficial ulcers.   Na 130, K 4,1 CL 98, bicarbonate 25 glucose 115 bun 38 cr 1,94  AST 64 ALT 41  BNP 1,121 High sensitive troponin 2,150, 1,742, 2,178, 2,438, 2,427   Lactic acid 3,1  Wbc 11.9 hgb 6,2 plt 191  Fecal occult positive   Chest radiograph with cardiomegaly, bilateral hilar vascular congestion, bilateral pleural effusions, more right than left. Sternotomy wires in place.   EKG 92 bpm, left axis deviation, normal intervals, atrial fibrillation rhythm with no significant ST segment changes, negative T wave lead V2 and V3.   Patient had PRBC transfusions.  Echocardiogram with reduced LV systolic function. 07/29 norepinephrine weaned off Placed on furosemide for diuresis.   07/31 transfer to [facility].  Patient continue aggressive diuresis with good toleration. His anemia remained stable after PRBC transfusions with no signs of bleeding.  Bone marrow biopsy hypercellular bone marrow with mild dyspoiesis, will follow up with hematology as outpatient.  08/11 volume status has improved, plan to continue diuresis at home with oral torsemide. Follow up as outpatient with cardiology, hematology and primary care.

## 2023-04-10 NOTE — Assessment & Plan Note (Addendum)
Severe symptomatic anemia.  Sp PRBC transfusion x 4  History of mild gastritis and clean bases 3 mm duodenal ulcer. Recent colonoscopy with polyps (removed).  Positive iron deficiency anemia with bone marrow hypercellular (biopsy 07.10.24).   Plan to follow up with hematology as outpatient.  His discharge hgb is 7,8.

## 2023-04-10 NOTE — Progress Notes (Signed)
   04/10/23 1049  Mobility  Activity Ambulated with assistance in hallway  Level of Assistance Contact guard assist, steadying assist  Assistive Device Crutches  Distance Ambulated (ft) 280 ft  Activity Response Tolerated well  Mobility Referral Yes  $Mobility charge 1 Mobility  Mobility Specialist Start Time (ACUTE ONLY) 1030  Mobility Specialist Stop Time (ACUTE ONLY) 1048  Mobility Specialist Time Calculation (min) (ACUTE ONLY) 18 min   Mobility Specialist: Progress Note  Pre-Mobility: HR 87  During Mobility: HR 103-121  Post-Mobility: HR 84  Pt received standing in room, agreeable to mobility session. Asymptomatic throughout. Pt left in chair with all needs met. Call bell within reach.   Barnie Mort Mobility Specialist Please contact via SecureChat or Rehab office at [phone]

## 2023-04-10 NOTE — Plan of Care (Signed)
  Problem: Clinical Measurements: Goal: Will remain free from infection Outcome: Progressing Goal: Diagnostic test results will improve Outcome: Progressing Goal: Respiratory complications will improve Outcome: Progressing   Problem: Activity: Goal: Risk for activity intolerance will decrease Outcome: Progressing   Problem: Nutrition: Goal: Adequate nutrition will be maintained Outcome: Progressing   Problem: Coping: Goal: Level of anxiety will decrease Outcome: Progressing   Problem: Elimination: Goal: Will not experience complications related to urinary retention Outcome: Progressing   Problem: Safety: Goal: Ability to remain free from injury will improve Outcome: Progressing

## 2023-04-10 NOTE — Assessment & Plan Note (Addendum)
SP CABG.  Troponin elevation due to demand ischemia.  Continue supportive medical therapy. Follow up hgb as outpatient.   Dyslipidemia.  Continue with statin therapy.

## 2023-04-11 ENCOUNTER — Ambulatory Visit: Payer: Medicare HMO | Admitting: Cardiology

## 2023-04-11 DIAGNOSIS — D649 Anemia, unspecified: Secondary | ICD-10-CM | POA: Diagnosis not present

## 2023-04-11 DIAGNOSIS — I4821 Permanent atrial fibrillation: Secondary | ICD-10-CM | POA: Diagnosis not present

## 2023-04-11 DIAGNOSIS — I5023 Acute on chronic systolic (congestive) heart failure: Secondary | ICD-10-CM | POA: Diagnosis not present

## 2023-04-11 DIAGNOSIS — N179 Acute kidney failure, unspecified: Secondary | ICD-10-CM | POA: Diagnosis not present

## 2023-04-11 MED ORDER — FUROSEMIDE 10 MG/ML IJ SOLN
80.0000 mg | Freq: Two times a day (BID) | INTRAMUSCULAR | Status: DC
  Administered 2023-04-11 – 2023-04-12 (×4): 80 mg via INTRAVENOUS
  Filled 2023-04-11 (×4): qty 8

## 2023-04-11 MED ORDER — POTASSIUM CHLORIDE CRYS ER 20 MEQ PO TBCR
20.0000 meq | EXTENDED_RELEASE_TABLET | Freq: Once | ORAL | Status: AC
  Administered 2023-04-11: 20 meq via ORAL
  Filled 2023-04-11: qty 1

## 2023-04-11 MED ORDER — METOPROLOL SUCCINATE ER 25 MG PO TB24
25.0000 mg | ORAL_TABLET | Freq: Every day | ORAL | Status: DC
  Administered 2023-04-12 – 2023-04-14 (×3): 25 mg via ORAL
  Filled 2023-04-11 (×3): qty 1

## 2023-04-11 MED ORDER — METOLAZONE 2.5 MG PO TABS
2.5000 mg | ORAL_TABLET | Freq: Once | ORAL | Status: AC
  Administered 2023-04-11: 2.5 mg via ORAL
  Filled 2023-04-11: qty 1

## 2023-04-11 NOTE — Progress Notes (Signed)
Heart Failure Stewardship Pharmacist Progress Note   PCP: [name], FNP PCP-Cardiologist: [name], MD    HPI:  82 yo M with PMH of CHF, aflutter, CAD s/p CABG x3 (2003) and subsequent PCI, HTN, HLD, moderate aortic stenosis, permanent AF on apixaban.   Admitted 09/2022 with acute on chronic HFpEF after failed outpatient management with increasing torsemide. BNP 288. CXR with pulmonary edema. EF 55-60% on ECHO. Started on IV lasix and GDMT optimized. R/LHC with 3 vessel CAD and volume overload recommending medical management. He was started on Farxiga and approved for patient assistance from [name]. He was discharged and had follow up with HF TOC clinic. ReDS 38% and some volume overload. Continued on torsemide 80 mg daily and instructed to take additional 20 mg PRN for weight gain.   Has had a few hospitalizations since for acute on chronic HF and GI bleeds / anemia.   Presented back to the ED on 7/28 with generalized weakness, shortness of breath, LE edema, and weeping leg. Transferred from [facility] to [facility] with GI bleed, hgb 6.2, FOBT +. Initially hypotensive requiring norepinephrine. GI and hematology consulted. Transfused with 2 units PRBC and hgb has been stable. GI signed off. Hematology ordering bone marrow biopsy - done 8/1.   ECHO 7/30 showed LVEF 30-35%, global hypokinesis, mild concentric LVH, RV mildly reduced, mild MR, mild AS.   Current HF Medications: Diuretic: furosemide 80 mg IV BID Beta Blocker: metoprolol XL 25 mg daily SGLT2i: Farxiga 10 mg daily  Prior to admission HF Medications: Diuretic: torsemide 40 mg BID Beta blocker: metoprolol tartrate 25 mg BID *was taking Farxiga 10 mg daily  Pertinent Lab Values: Serum creatinine 1.00, BUN 21, Potassium 3.7, Sodium 132, BNP 1121, Magnesium 2.3  Vital Signs: Weight: 195 lbs (admission weight: 189 lbs) Blood pressure: 100/60s  Heart rate: 80s I/O: net -0.1L yesterday; net -4.2L since admission  Medication  Assistance / Insurance Benefits Check: Does the patient have prescription insurance?  Yes Type of insurance plan: [name]  Does the patient qualify for medication assistance through manufacturers or grants?   Yes Eligible grants and/or patient assistance programs: Farxiga Medication assistance applications in progress: none  Medication assistance applications approved: Farxiga Approved medication assistance renewals will be completed by: [facility]  Outpatient Pharmacy:  [name] Is the patient willing to use [facility] TOC pharmacy at discharge? Yes Is the patient willing to transition their outpatient pharmacy to utilize a [facility] outpatient pharmacy?  No    Assessment: 1. Acute on chronic systolic CHF (LVEF 30-35%). NYHA class III symptoms. - Agree with increasing to furosemide 80 mg IV BID. Strict I/Os and daily weights. Keep K>4 and Mg>2. - Agree with transitioning to metoprolol XL 25 mg daily - BP low normal, no MRA or ARB yet to allow for aggressive diuresis - Continue Farxiga 10 mg daily. He has been approved for patient assistance and receives this in the mail from [name]. Wife confirms he has this supply at home. He will not need a new refill of this medication at discharge.   Plan: 1) Medication changes recommended at this time: - Agree with changes  2) Patient assistance: - Approved for Farxiga patient assistance - last delivery for 90 day supply sent on 01/25/23  3)  Education  - Patient has been educated on current HF medications and potential additions to HF medication regimen - Patient verbalizes understanding that over the next few months, these medication doses may change and  more medications may be added to optimize HF regimen - Patient has been educated on basic disease state pathophysiology and goals of therapy   [name], PharmD, BCPS Heart Failure Stewardship Pharmacist Phone [phone]

## 2023-04-11 NOTE — Plan of Care (Signed)

## 2023-04-11 NOTE — Plan of Care (Signed)
  Problem: Education: Goal: Knowledge of General Education information will improve Description: Including pain rating scale, medication(s)/side effects and non-pharmacologic comfort measures Outcome: Progressing   Problem: Clinical Measurements: Goal: Respiratory complications will improve Outcome: Progressing   Problem: Activity: Goal: Risk for activity intolerance will decrease Outcome: Progressing   Problem: Nutrition: Goal: Adequate nutrition will be maintained Outcome: Progressing   Problem: Coping: Goal: Level of anxiety will decrease Outcome: Progressing   Problem: Safety: Goal: Ability to remain free from injury will improve Outcome: Progressing

## 2023-04-11 NOTE — Progress Notes (Signed)
Mobility Specialist: Progress Note   04/11/23 1050  Mobility  Activity Ambulated with assistance in hallway  Level of Assistance Contact guard assist, steadying assist  Assistive Device Crutches  Distance Ambulated (ft) 280 ft  Activity Response Tolerated well  Mobility Referral Yes  $Mobility charge 1 Mobility  Mobility Specialist Start Time (ACUTE ONLY) 1030  Mobility Specialist Stop Time (ACUTE ONLY) 1050  Mobility Specialist Time Calculation (min) (ACUTE ONLY) 20 min   During Mobility: HR 93-121 Post-Mobility: HR 86  Pt was agreeable to mobility session - received in chair. Sv for STS, MinG for ambulation. Pt displayed slight L lean during ambulation; no LOB. Denies SOB or any discomfort during session . Left in chair with all needs met, call bell in reach.   Maurene Capes Mobility Specialist Please contact via SecureChat or Rehab office at [phone]

## 2023-04-11 NOTE — Progress Notes (Signed)
Rounding Note    Patient Name: Leonard Cisneros Date of Encounter: 04/11/2023  [facility] Cardiologist: [name], MD   Subjective   Recorded as net -500 cc yesterday but had unmeasured UOP.  BP 111/64 this morning.  Creatinine stable at 1.0.  Denies any chest pain or dyspnea  Inpatient Medications    Scheduled Meds:  sodium chloride   Intravenous Once   atorvastatin  80 mg Oral Daily   Chlorhexidine Gluconate Cloth  6 each Topical Daily   dapagliflozin propanediol  10 mg Oral Daily   feeding supplement  1 Container Oral TID BM   furosemide  60 mg Intravenous BID   metoprolol tartrate  12.5 mg Oral BID   multivitamin  1 tablet Oral QHS   pantoprazole  40 mg Oral BID   polyethylene glycol  17 g Oral BID   potassium chloride SA  20 mEq Oral Daily   simethicone  80 mg Oral QID   Continuous Infusions:  sodium chloride     PRN Meds: docusate sodium, melatonin, mouth rinse   Vital Signs    Vitals:   04/10/23 1922 04/10/23 2000 04/10/23 2324 04/11/23 0629  BP: 101/69  90/66 111/64  Pulse:   94 81  Resp: (!) 23 19  20   Temp: 97.6 F (36.4 C)  97.6 F (36.4 C) 98.1 F (36.7 C)  TempSrc: Oral  Oral Oral  SpO2:   92% 91%  Weight:    88.5 kg  Height:        Intake/Output Summary (Last 24 hours) at 04/11/2023 0829 Last data filed at 04/11/2023 1610 Gross per 24 hour  Intake 285.21 ml  Output 800 ml  Net -514.79 ml      04/11/2023    6:29 AM 04/10/2023    4:33 AM 04/09/2023    3:37 AM  Last 3 Weights  Weight (lbs) 195 lb 1.7 oz 196 lb 6.9 oz 196 lb 6.9 oz  Weight (kg) 88.5 kg 89.1 kg 89.1 kg      Telemetry    Atrial fibrillation with rates 80-90s- Personally Reviewed  ECG    No new ECG tracing today. - Personally Reviewed  Physical Exam   GEN: Elderly Caucasian male in no acute distress.   Neck: JVD elevated with patient sitting upright in chair. Cardiac: Irregularly irregular rhythm with normal rate. II/VI murmur noted.   Respiratory: No  increased work of breathing. No significant wheezes, rhonchi, or rales appreciated. GI: Soft,, and non-tender. MS: 1+ pitting edema of bilaterally lower extremities. Right lower extremity is weeping and is wrapped in gauze. Skin: Warm and dry. Neuro:  No focal deficits. Psych: Normal affect. Responds appropriately.  Labs    High Sensitivity Troponin:   Recent Labs  Lab 03/31/23 1004 03/31/23 1416 03/31/23 2133 03/31/23 2354 04/01/23 0542  TROPONINIHS 2,150* 1,742* 2,178* 2,438* 2,427*     Chemistry Recent Labs  Lab 04/08/23 0115 04/09/23 0400 04/10/23 0312 04/11/23 0313  NA 131* 134* 133* 132*  K 3.8 3.5 3.6 3.7  CL 96* 97* 93* 97*  CO2 26 28 25 27   GLUCOSE 93 99 89 98  BUN 16 17 22 21   CREATININE 1.06 1.16 1.00 1.00  CALCIUM 8.1* 8.4* 8.6* 8.5*  MG 2.2 2.0 1.9 2.3  ALBUMIN 2.9*  --   --   --   GFRNONAA >60 >60 >60 >60  ANIONGAP 9 9 15 8     Lipids No results for input(s): "CHOL", "TRIG", "HDL", "LABVLDL", "LDLCALC", "  CHOLHDL" in the last 168 hours.  Hematology Recent Labs  Lab 04/08/23 0115 04/09/23 0400 04/09/23 1601 04/10/23 0312  WBC 6.8 7.1  --  6.3  RBC 2.19* 2.42*  --  2.58*  HGB 7.3* 8.0* 8.2* 8.6*  HCT 23.3* 24.9* 25.9* 26.6*  MCV 106.4* 102.9*  --  103.1*  MCH 33.3 33.1  --  33.3  MCHC 31.3 32.1  --  32.3  RDW 23.5* 24.5*  --  23.3*  PLT 195 190  --  177   Thyroid No results for input(s): "TSH", "FREET4" in the last 168 hours.  BNP No results for input(s): "BNP", "PROBNP" in the last 168 hours.   DDimer No results for input(s): "DDIMER" in the last 168 hours.   Radiology    No results found.  Cardiac Studies   Right/ Left Cardiac Catheterization 09/27/2022:   Ost LAD to Prox LAD lesion is 100% stenosed.   Ramus lesion is 100% stenosed.   Prox RCA to Mid RCA lesion is 60% stenosed.   Dist RCA lesion is 90% stenosed.   Origin to Prox Graft lesion is 100% stenosed.   LIMA graft was visualized by angiography and is large.   RIMA graft  was visualized by non-selective angiography and is normal in caliber.   The graft exhibits no disease.   The graft exhibits no disease.   LV end diastolic pressure is mildly elevated.   Hemodynamic findings consistent with mild pulmonary hypertension.   There is moderate aortic valve stenosis.   3 vessel occlusive CAD. The native LCx is widely patent LIMA to the LAD is patent RIMA to the PDA is patent SVG to the ramus intermediate is occluded. This appears to be chronic Mild to Moderate aortic stenosis. AV mean gradient 18.5 mm Hg. AVA 2.5 cm squared with index 1.15. Elevated LV filling pressures. LVEDP 20 mm Hg. Mean PCWP 24 mm Hg with large V waves to 42 mm Hg Mild pulmonary HTN. PAP 56/17 with mean 31 mm Hg.    Plan: medical management. Continue diuresis. Would benefit greatly from correction of severe anemia. Will need to monitor Aortic stenosis serially going forward.   Diagnostic Dominance: Right  _______________  Echocardiogram 04/02/2023: Impressions: 1. Left ventricular ejection fraction, by estimation, is 30 to 35%. The  left ventricle has moderately decreased function. The left ventricle  demonstrates global hypokinesis. The left ventricular internal cavity size  was mildly dilated. There is mild  concentric left ventricular hypertrophy. Left ventricular diastolic  parameters are indeterminate.   2. Right ventricular systolic function is mildly reduced. The right  ventricular size is mildly enlarged. Tricuspid regurgitation signal is  inadequate for assessing PA pressure.   3. Left atrial size was severely dilated.   4. Right atrial size was moderately dilated.   5. The mitral valve is normal in structure. Mild mitral valve  regurgitation. Mild mitral stenosis. The mean mitral valve gradient is 6.0  mmHg. Moderate mitral annular calcification.   6. The aortic valve is tricuspid. There is moderate calcification of the  aortic valve. Aortic valve regurgitation is not  visualized. Mild aortic  valve stenosis. Aortic valve mean gradient measures 14.0 mmHg.   7. The inferior vena cava is dilated in size with <50% respiratory  variability, suggesting right atrial pressure of 15 mmHg.    Patient Profile     82 y.o. male with a history of CAD s/p remote CABG x3 in 2003 with subsequent BMS to SVG-RI in 2017 (last cath  in 09/2022 showed 2/3 patent grafts with occluded SVG-RI), ischemic cardiomyopathy/ chronic HFpEF with EF of 55-60% on Echo in 09/2022, permanent atrial fibrillation and paroxysmal atrial flutter not on anticoagulation given GI bleeds and anemia, aortic stenosis, hypertension, hyperlipidemia, chronic venous stasis dermatitis, and GI bleeding with recurrent symptomatic anemia who presented on 03/31/2023 with weakness and was noted to be hypotension with a hemoglobin of 6.2. He was admitted for decompensated CHF and recurrent anemia. He was started on pressors and transfused with PRBC. Echo showed a new drop in EF to 30-35%. Cardiology was consulted for assistance.  Assessment & Plan    Acute on Chronic HFrEF Patient has a history of chronic HFpEF with EF of 55-60% on Echo in 09/2022. He presented with generalized and was found to be in decompensated CHF and severely anemic. BNP elevated at 1,121. Chest x-ray showed cardiomegaly with moderate pulmonary vascular congestion and bilateral effusions compatible with CHF. Echo showed LVEF of 30-35% with global hypokinesis and mild LVH, mildly reduced RV, biatrial enlargement (left > right), mild AS, and mild MR/ MS.  - Still looks volume overloaded. + JVD - Continue IV Lasix, will increase dose to 80 mg twice daily - GDMT limited due to hypotension. On low dose Lopressor for rate control, will consolidate to toprol XL.  Continue farxiga 10 mg daily - Continue to monitor daily weights, strict I/Os, and renal function. - He is not a candidate for cardiac catheterization with his severe anemia.   Demand Ischemia CAD  s/p CABG S/p remote CABG in 2003 with subsequent PCI with BMS to SVG-RI. Last cath in 09/2022 showed an occluded SVG to RI but patent LIMA to LAD and RIMA to PDA. Continued medical therapy recommended. High-sensitivity troponin peaked at 2,438 but could represent demand ischemia from decompensated CHF and severe anemia. Echo showed a drop in EF to 30-35% with global hypokinesis.. - No chest pain. - Has not been on aspirin given severe anemia, would restart if OK with hematology once Hgb stable - Continue beta-blocker and high-intensity statin. - Not a candidate for cardiac catheterization at this time.   Permanent Atrial Fibrillation Rates mostly in the 80s to low 100s. - Continue Lopressor 12.5mg  twice daily. - CHA2DS2-VASc = 5 (CAD, CHF, HTN, age x2). Not a candidate for anticoagulation with severe anemia and GI bleeding. He has had CTA to assess for Watchman and can continue to follow-up with Dr. Lalla Brothers as an outpatient.   Hypotension He was hypotensive on arrival and initially required Levophed in addition to blood transfusion. BP remains low-normal but is stable. - Continue low dose metoprolol as above.   Hyperlipidemia - Continue Lipitor 80mg  daily.   Acute on Chronic Anemia Hemoglobin 6.2 on admission.  Has been as low as 5.0 12/2022.  Hemoccult positive. Recent EGD on 02/15/2023 showed gastritis and a non-bleeding duodenal ulcer with a clean ulcer base. Recent colonoscopy on 03/13/2023 showed non-bleeding internal hemorrhoid and two 3-31mm polyps in the ascending colon and in the cecum which were removed with a cold snare. GI and Hematology consulted and anemia not felt to be due to GI bleeding. Reticulocyte count has been low indicating a bone marrow production issue. S/p bone marrow biopsy on 8/1, consistent with MDS per hematology - Hemoglobin improved to 8.6 after 1 unit PRBCs 8/5 and 1 unit 8/6.  Starting on ESA per Heme - Management per Internal Medicine and Hematology.   AKI on CKD  Stage III Creatinine 1.94 on admission. Baseline around 0.9 to  1.2. Likely due to hypotension and decompensated CHF. Improved. - Creatinine stable at 1.00 today - Continue to monitor with diuresis.    For questions or updates, please contact [facility] Please consult [email or web address] for contact info under        Signed, Little Ishikawa, MD  04/11/2023, 8:29 AM

## 2023-04-11 NOTE — Progress Notes (Signed)
Progress Note   Patient: Leonard Cisneros:[id number] DOB: 04/30/1941 DOA: 03/31/2023     11 DOS: the patient was seen and examined on 04/11/2023   Brief hospital course: Leonard Cisneros was admitted with the working diagnosis of heart failure exacerbation in the setting of severe symptomatic anemia.   82 year old man with increasing lethargy and dyspnea at home. He was taken to [facility] where he was found to have hemoglobin of 6.2 along with hypotension with systolic pressure down into the 80s requiring norepinephrine.  Transferred from [facility] C[facility]neros to [facility] for ICU admission. At the time of his transfer his blood pressure was 92/60, HR 78, RR 21 and 02 saturation 100%, lungs with no wheezing or rales, heart with S1 and S2 present and rhythmic, abdomen with no distention and positive lower extremity edema. Venous stasis ulcers to the level of the knee with open superficial ulcers.   Na 130, K 4,1 CL 98, bicarbonate 25 glucose 115 bun 38 cr 1,94  AST 64 ALT 41  BNP 1,121 High sensitive troponin 2,150, 1,742, 2,178, 2,438, 2,427   Lactic acid 3,1  Wbc 11.9 hgb 6,2 plt 191  Fecal occult positive   Chest radiograph with cardiomegaly, bilateral hilar vascular congestion, bilateral pleural effusions, more right than left. Sternotomy wires in place.   EKG 92 bpm, left axis deviation, normal intervals, atrial fibrillation rhythm with no significant Leonard segment changes, negative T wave lead V2 and V3.   Patient had PRBC transfusions.  Echocardiogram with reduced LV systolic function. 07/29 norepinephrine weaned off Placed on furosemide for diuresis.   07/31 transfer to [facility].   Assessment and Plan: Acute on chronic HFrEF (heart failure with reduced ejection fraction) (HCC) Echocardiogram with reduced LV systolic function with EF 30 to 35%, global hypokinesis, mild LVH, RV systolic function mildly reduced, RV cavity with mild enlargement, LA with severe dilatation, RA with moderate dilatation, no  significant valvular disease.   Urine output is 800 ml Systolic blood pressure 95 to 440 mmHg.   Furosemide increased to 80 mg IV bid SGLT 2 inh to augment diuresis. This morning with no significant urine output, will add one dose of metolazone.   Continue low dose metoprolol.  Holding RAAS due to risk of hypotension.   Anemia Severe symptomatic anemia.  Sp PRBC transfusion x 4  History of mild gastritis and clean bases 3 mm duodenal ulcer. Recent colonoscopy with polyps (removed).  Positive iron deficiency anemia with bone marrow hypercellular (biopsy 07.10.24).   Atrial fibrillation (HCC) Continue reate control with metoprolol. Holding on anticoagulation due to severe anemia.  AKI (acute kidney injury) (HCC) Hypokalemia. Hyponatremia.  Continue volume overloaded.  Renal function with serum cr at 1,0 with K at 3,7 and serum bicarbonate at 27. Na 132.  Mg 2.3   Add 40 meq Kcl x1 Follow up renal function in am.  Avoid hypotension.   Coronary artery disease SP CABG.  Troponin elevation due to demand ischemia.  Continue supportive medical therapy and keep Hgb at 8 or greater.   Dyslipidemia.  Continue with statin therapy.     Subjective: Patient continue to have edema at his lower extremities, through the course of the morning no significant urine output.   Physical Exam: Vitals:   04/10/23 2324 04/11/23 0629 04/11/23 1122 04/11/23 1148  BP: 90/66 111/64 (!) 95/49 (!) 95/49  Pulse: 94 81    Resp:  20 (!) 30 20  Temp: 97.6 F (36.4 C) 98.1 F (36.7 C) (!)  97.5 F (36.4 C)   TempSrc: Oral Oral Oral   SpO2: 92% 91%    Weight:  88.5 kg    Height:       Neurology awake and alert ENT With mild pallor Cardiovascular with S1 and S2 present and regular with no gallops Respiratory with rales at bases with no wheezing Abdomen with no distention  Positive lower extremity edema ++ pitting bilaterally, right lower extremity with dressing in place.  Data  Reviewed:    Family Communication: no family at the bedside   Disposition: Status is: Inpatient Remains inpatient appropriate because: IV diuresis   Planned Discharge Destination: Home     Author: Coralie Keens, MD 04/11/2023 3:32 PM  For on call review [email or web address].

## 2023-04-12 DIAGNOSIS — I2510 Atherosclerotic heart disease of native coronary artery without angina pectoris: Secondary | ICD-10-CM | POA: Diagnosis not present

## 2023-04-12 DIAGNOSIS — I5043 Acute on chronic combined systolic (congestive) and diastolic (congestive) heart failure: Secondary | ICD-10-CM | POA: Diagnosis not present

## 2023-04-12 DIAGNOSIS — D649 Anemia, unspecified: Secondary | ICD-10-CM | POA: Diagnosis not present

## 2023-04-12 DIAGNOSIS — I4821 Permanent atrial fibrillation: Secondary | ICD-10-CM | POA: Diagnosis not present

## 2023-04-12 DIAGNOSIS — N179 Acute kidney failure, unspecified: Secondary | ICD-10-CM | POA: Diagnosis not present

## 2023-04-12 MED ORDER — POTASSIUM CHLORIDE CRYS ER 20 MEQ PO TBCR
40.0000 meq | EXTENDED_RELEASE_TABLET | Freq: Every day | ORAL | Status: DC
  Administered 2023-04-12 – 2023-04-14 (×3): 40 meq via ORAL
  Filled 2023-04-12 (×3): qty 2

## 2023-04-12 NOTE — Progress Notes (Signed)
Heart Failure Stewardship Pharmacist Progress Note   PCP: [name], FNP PCP-Cardiologist: [name], MD    HPI:  82 yo M with PMH of CHF, aflutter, CAD s/p CABG x3 (2003) and subsequent PCI, HTN, HLD, moderate aortic stenosis, permanent AF on apixaban.   Admitted 09/2022 with acute on chronic HFpEF after failed outpatient management with increasing torsemide. BNP 288. CXR with pulmonary edema. EF 55-60% on ECHO. Started on IV lasix and GDMT optimized. R/LHC with 3 vessel CAD and volume overload recommending medical management. He was started on Farxiga and approved for patient assistance from [name]. He was discharged and had follow up with HF TOC clinic. ReDS 38% and some volume overload. Continued on torsemide 80 mg daily and instructed to take additional 20 mg PRN for weight gain.   Has had a few hospitalizations since for acute on chronic HF and GI bleeds / anemia.   Presented back to the ED on 7/28 with generalized weakness, shortness of breath, LE edema, and weeping leg. Transferred [facility] with GI bleed, hgb 6.2, FOBT +. Initially hypotensive requiring norepinephrine. GI and hematology consulted. Transfused with 2 units PRBC and hgb has been stable. GI signed off. Hematology ordering bone marrow biopsy - done 8/1.   ECHO 7/30 showed LVEF 30-35%, global hypokinesis, mild concentric LVH, RV mildly reduced, mild MR, mild AS.   Current HF Medications: Diuretic: furosemide 80 mg IV BID Beta Blocker: metoprolol XL 25 mg daily SGLT2i: Farxiga 10 mg daily  Prior to admission HF Medications: Diuretic: torsemide 40 mg BID Beta blocker: metoprolol tartrate 25 mg BID *was taking Farxiga 10 mg daily  Pertinent Lab Values: Serum creatinine 1.16, BUN 24, Potassium 3.4, Sodium 131, BNP 1121, Magnesium 2.1  Vital Signs: Weight: 192 lbs (admission weight: 189 lbs) Blood pressure: 100/60-150/100s  Heart rate: 70-80s I/O: net -1.3L yesterday; net -6.3L since  admission  Medication Assistance / Insurance Benefits Check: Does the patient have prescription insurance?  Yes Type of insurance plan: [name]  Does the patient qualify for medication assistance through manufacturers or grants?   Yes Eligible grants and/or patient assistance programs: Farxiga Medication assistance applications in progress: none  Medication assistance applications approved: Farxiga Approved medication assistance renewals will be completed by: [facility]  Outpatient Pharmacy:  [name] Is the patient willing to use [facility] TOC pharmacy at discharge? Yes Is the patient willing to transition their outpatient pharmacy to utilize a [facility] outpatient pharmacy?  No    Assessment: 1. Acute on chronic systolic CHF (LVEF 30-35%). NYHA class III symptoms. - Continue furosemide 80 mg IV BID. Good response to addition of metolazone yesterday. Volume status improving. Strict I/Os and daily weights. Keep K>4 and Mg>2. - Continue metoprolol XL 25 mg daily - BP variable - No MRA or ARB yet to allow for aggressive diuresis - Continue Farxiga 10 mg daily. He has been approved for patient assistance and receives this in the mail from [name]. Wife confirms he has this supply at home. He will not need a new refill of this medication at discharge.   Plan: 1) Medication changes recommended at this time: - Agree with changes  2) Patient assistance: - Approved for Farxiga patient assistance - last delivery for 90 day supply sent on 82/24/24  3)  Education  - Patient has been educated on current HF medications and potential additions to HF medication regimen - Patient verbalizes understanding that over the next few months, these medication  doses may change and more medications may be added to optimize HF regimen - Patient has been educated on basic disease state pathophysiology and goals of therapy   [name], PharmD, BCPS Heart Failure Stewardship  Pharmacist Phone [phone]

## 2023-04-12 NOTE — Plan of Care (Signed)

## 2023-04-12 NOTE — Progress Notes (Signed)
Progress Note   Patient: Leonard Cisneros ZOX:[id number] DOB: Jul 20, 1941 DOA: 03/31/2023     12 DOS: the patient was seen and examined on 04/12/2023   Brief hospital course: [name] was admitted with the working diagnosis of heart failure exacerbation in the setting of severe symptomatic anemia.   82 year old man with increasing lethargy and dyspnea at home. He was taken to [facility] ED where he was found to have hemoglobin of 6.2 along with hypotension with systolic pressure down into the 80s requiring norepinephrine.  Transferred from [facility] to [facility] for ICU admission. At the time of his transfer his blood pressure was 92/60, HR 78, RR 21 and 02 saturation 100%, lungs with no wheezing or rales, heart with S1 and S2 present and rhythmic, abdomen with no distention and positive lower extremity edema. Venous stasis ulcers to the level of the knee with open superficial ulcers.   Na 130, K 4,1 CL 98, bicarbonate 25 glucose 115 bun 38 cr 1,94  AST 64 ALT 41  BNP 1,121 High sensitive troponin 2,150, 1,742, 2,178, 2,438, 2,427   Lactic acid 3,1  Wbc 11.9 hgb 6,2 plt 191  Fecal occult positive   Chest radiograph with cardiomegaly, bilateral hilar vascular congestion, bilateral pleural effusions, more right than left. Sternotomy wires in place.   EKG 92 bpm, left axis deviation, normal intervals, atrial fibrillation rhythm with no significant ST segment changes, negative T wave lead V2 and V3.   Patient had PRBC transfusions.  Echocardiogram with reduced LV systolic function. 07/29 norepinephrine weaned off Placed on furosemide for diuresis.   07/31 transfer to [facility].   Assessment and Plan: Acute on chronic combined systolic and diastolic CHF (congestive heart failure) (HCC) Echocardiogram with reduced LV systolic function with EF 30 to 35%, global hypokinesis, mild LVH, RV systolic function mildly reduced, RV cavity with mild enlargement, LA with severe dilatation, RA with moderate  dilatation, no significant valvular disease.   Urine output is 2,300 ml Systolic blood pressure 93 to 811 mmHg.   Furosemide increased to 80 mg IV bid SGLT 2 inh to augment diuresis. 08/08 metolazone 2.5 mg   Continue low dose metoprolol.  Holding RAAS due to risk of hypotension.   Anemia Severe symptomatic anemia.  Sp PRBC transfusion x 4  History of mild gastritis and clean bases 3 mm duodenal ulcer. Recent colonoscopy with polyps (removed).  Positive iron deficiency anemia with bone marrow hypercellular (biopsy 07.10.24).   Atrial fibrillation (HCC) Continue reate control with metoprolol. Holding on anticoagulation due to severe anemia.  AKI (acute kidney injury) (HCC) Hypokalemia. Hyponatremia.  Volume status has improved,. Renal function with serum cr at 1,16 with K at 3,4 and serum bicarbonate at 26 Na 131 Mg 2.1   Continue K correction with Kcl Follow up renal function in am.   Coronary artery disease SP CABG.  Troponin elevation due to demand ischemia.  Continue supportive medical therapy and keep Hgb at 8 or greater.   Dyslipidemia.  Continue with statin therapy.      Subjective: Patient with improvement in edema, no chest pain or dyspnea   Physical Exam: Vitals:   04/12/23 0853 04/12/23 0900 04/12/23 1122 04/12/23 1557  BP: (!) 150/120 (!) 150/120 (!) 93/51 (!) 106/55  Pulse: 79 79    Resp: 16  20 19   Temp: 97.6 F (36.4 C)  (!) 97.4 F (36.3 C) 98 F (36.7 C)  TempSrc: Oral  Oral Oral  SpO2: 96%     Weight:  Height:       Neurology awake and alert ENT with mild pallor Cardiovascular with S1 and S2 present, irregularly irregular with no gallops Respiratory with no rales or wheezing' Abdomen with no distention  Positive lower extremity edema ++  Data Reviewed:    Family Communication: no family at the bedside   Disposition: Status is: Inpatient Remains inpatient appropriate because: heart failure IV diuresis   Planned Discharge  Destination: Home    Author: Coralie Keens, MD 04/12/2023 5:16 PM  For on call review [email or web address].

## 2023-04-12 NOTE — Progress Notes (Signed)
Physical Therapy Treatment Patient Details Name: Leonard Cisneros MRN: [id number] DOB: 04-16-1941 Today's Date: 04/12/2023   History of Present Illness 82 year old man brought to ED on 7/28 with increasing lethargy and dyspnea at home  Found to have hemoglobin of 6.2, transfusion started. Pt hypotensive. PMH: ischemic cardiomyopathy with triple-vessel disease, s/p bypass 2003. CAD, HTN, CAD, CHF, LE sores, R worse than L.    PT Comments  Pt received sitting in the recliner and agreeable to session. Pt able to perform 2 sets of 5 STS from recliner with BUE and single UE support for power up. Pt demonstrating increased difficulty with single UE support requiring mod A. Pt requires increased encouragement to attempt challenging tasks. Pt able to perform seated therex demonstrating reduced LLE hip flexion during seated marching. Pt able to tolerate gait trial in the hallway, however was limited due to fatigue. Pt continues to benefit from PT services to progress toward functional mobility goals.    If plan is discharge home, recommend the following: A little help with walking and/or transfers;A little help with bathing/dressing/bathroom;Assist for transportation;Help with stairs or ramp for entrance   Can travel by private vehicle        Equipment Recommendations  None recommended by PT    Recommendations for Other Services       Precautions / Restrictions Precautions Precautions: Fall Restrictions Weight Bearing Restrictions: No     Mobility  Bed Mobility               General bed mobility comments: Pt beginning and ending session in recliner    Transfers Overall transfer level: Needs assistance Equipment used: None Transfers: Sit to/from Stand Sit to Stand: Contact guard assist, Mod assist           General transfer comment: STS from recliner 2 sets x5 with BUE and single UE support. Pt able to stand with BUE support with CGA, however required mod A to stand with  single UE support for anterior lean and power up    Ambulation/Gait Ambulation/Gait assistance: Supervision Gait Distance (Feet): 200 Feet Assistive device: Crutches Gait Pattern/deviations: Step-through pattern, Decreased stride length, Trunk flexed Gait velocity: decreased     General Gait Details: Pt demonstrating slow step-through pattern with crutches placed wide and pt's trunk flexed. No overt instability       Balance Overall balance assessment: Needs assistance Sitting-balance support: Feet supported, No upper extremity supported Sitting balance-Leahy Scale: Good Sitting balance - Comments: sitting in recliner   Standing balance support: During functional activity, Bilateral upper extremity supported Standing balance-Leahy Scale: Fair Standing balance comment: with crutches support. Pt able to static stand without AD with mild instability                            Cognition Arousal: Alert Behavior During Therapy: WFL for tasks assessed/performed Overall Cognitive Status: Within Functional Limits for tasks assessed                                          Exercises General Exercises - Lower Extremity Long Arc Quad: AROM, Seated, Both, 10 reps Hip Flexion/Marching: AROM, Seated, Both, 10 reps    General Comments        Pertinent Vitals/Pain Pain Assessment Pain Assessment: No/denies pain     PT Goals (current goals can now be found in  the care plan section) Acute Rehab PT Goals Patient Stated Goal: home PT Goal Formulation: With patient/family Time For Goal Achievement: 04/16/23 Potential to Achieve Goals: Good Progress towards PT goals: Progressing toward goals    Frequency    Min 1X/week      PT Plan Current plan remains appropriate       AM-PAC PT "6 Clicks" Mobility   Outcome Measure  Help needed turning from your back to your side while in a flat bed without using bedrails?: None Help needed moving from  lying on your back to sitting on the side of a flat bed without using bedrails?: None Help needed moving to and from a bed to a chair (including a wheelchair)?: A Little Help needed standing up from a chair using your arms (e.g., wheelchair or bedside chair)?: A Little Help needed to walk in hospital room?: A Little Help needed climbing 3-5 steps with a railing? : A Lot 6 Click Score: 19    End of Session Equipment Utilized During Treatment: Gait belt Activity Tolerance: Patient tolerated treatment well Patient left: in chair;with call bell/phone within reach Nurse Communication: Mobility status PT Visit Diagnosis: Unsteadiness on feet (R26.81);Muscle weakness (generalized) (M62.81)     Time: 3086-5784 PT Time Calculation (min) (ACUTE ONLY): 25 min  Charges:    $Gait Training: 8-22 mins $Therapeutic Exercise: 8-22 mins PT General Charges $$ ACUTE PT VISIT: 1 Visit                     Johny Shock, PTA Acute Rehabilitation Services Secure Chat Preferred  [phone]    Johny Shock 04/12/2023, 4:04 PM

## 2023-04-12 NOTE — Plan of Care (Signed)

## 2023-04-12 NOTE — Progress Notes (Signed)
Mobility Specialist Progress Note:   04/12/23 1135  Mobility  Activity Ambulated with assistance in hallway  Level of Assistance Contact guard assist, steadying assist  Assistive Device Crutches  Distance Ambulated (ft) 372 ft  Activity Response Tolerated well  Mobility Referral Yes  $Mobility charge 1 Mobility  Mobility Specialist Start Time (ACUTE ONLY) 1030  Mobility Specialist Stop Time (ACUTE ONLY) 1045  Mobility Specialist Time Calculation (min) (ACUTE ONLY) 15 min   Pre Mobility: 85 HR , 96/53 BP  During Mobility: 96-126 HR Post Mobility: 95 HR   Pt received in chair, agreeable to mobility. Pt denied any feelings of pain or discomfort during ambulation, asymptomatic throughout. Pt returned to chair with call bell in reach and all needs met.   Leonard Cisneros  Mobility Specialist Please contact via [employer] office at [phone]

## 2023-04-12 NOTE — Progress Notes (Signed)
Rounding Note    Patient Name: Leonard Cisneros Date of Encounter: 04/12/2023  [facility] Cardiologist: [name], MD   Subjective   Net -1.9 L yesterday, -6.3 L on admission.  Mild bump in creatinine (1.0 > 1.16).  BP 96/50.  Denies any chest pain or dyspnea.  Inpatient Medications    Scheduled Meds:  sodium chloride   Intravenous Once   atorvastatin  80 mg Oral Daily   Chlorhexidine Gluconate Cloth  6 each Topical Daily   dapagliflozin propanediol  10 mg Oral Daily   feeding supplement  1 Container Oral TID BM   furosemide  80 mg Intravenous BID   metoprolol succinate  25 mg Oral Daily   metoprolol tartrate  12.5 mg Oral BID   multivitamin  1 tablet Oral QHS   pantoprazole  40 mg Oral BID   polyethylene glycol  17 g Oral BID   potassium chloride SA  20 mEq Oral Daily   simethicone  80 mg Oral QID   Continuous Infusions:  sodium chloride     PRN Meds: docusate sodium, melatonin, mouth rinse   Vital Signs    Vitals:   04/12/23 0000 04/12/23 0324 04/12/23 0329 04/12/23 0500  BP: (!) 94/52 (!) 96/50 (!) 96/50   Pulse: 82 80 86   Resp: 18 19 20    Temp: 98 F (36.7 C) 98.1 F (36.7 C)    TempSrc: Oral Oral    SpO2: 93% 93% 93%   Weight:    87.3 kg  Height:        Intake/Output Summary (Last 24 hours) at 04/12/2023 0735 Last data filed at 04/12/2023 0329 Gross per 24 hour  Intake 360 ml  Output 2300 ml  Net -1940 ml      04/12/2023    5:00 AM 04/11/2023    6:29 AM 04/10/2023    4:33 AM  Last 3 Weights  Weight (lbs) 192 lb 7.4 oz 195 lb 1.7 oz 196 lb 6.9 oz  Weight (kg) 87.3 kg 88.5 kg 89.1 kg      Telemetry    Atrial fibrillation with rates 80-90s- Personally Reviewed  ECG    No new ECG tracing today. - Personally Reviewed  Physical Exam   GEN: Elderly Caucasian male in no acute distress.   Neck: JVD elevated with patient sitting upright in chair. Cardiac: Irregularly irregular rhythm with normal rate. II/VI murmur noted.    Respiratory: No increased work of breathing. No significant wheezes, rhonchi, or rales appreciated. GI: Soft,, and non-tender. MS: 1+ pitting edema of bilaterally lower extremities. Right lower extremity is weeping and is wrapped in gauze. Skin: Warm and dry. Neuro:  No focal deficits. Psych: Normal affect. Responds appropriately.  Labs    High Sensitivity Troponin:   Recent Labs  Lab 03/31/23 1004 03/31/23 1416 03/31/23 2133 03/31/23 2354 04/01/23 0542  TROPONINIHS 2,150* 1,742* 2,178* 2,438* 2,427*     Chemistry Recent Labs  Lab 04/08/23 0115 04/09/23 0400 04/10/23 0312 04/11/23 0313 04/12/23 0037  NA 131*   < > 133* 132* 131*  K 3.8   < > 3.6 3.7 3.4*  CL 96*   < > 93* 97* 93*  CO2 26   < > 25 27 26   GLUCOSE 93   < > 89 98 100*  BUN 16   < > 22 21 24*  CREATININE 1.06   < > 1.00 1.00 1.16  CALCIUM 8.1*   < > 8.6* 8.5* 8.5*  MG  2.2   < > 1.9 2.3 2.1  ALBUMIN 2.9*  --   --   --   --   GFRNONAA >60   < > >60 >60 >60  ANIONGAP 9   < > 15 8 12    < > = values in this interval not displayed.    Lipids No results for input(s): "CHOL", "TRIG", "HDL", "LABVLDL", "LDLCALC", "CHOLHDL" in the last 168 hours.  Hematology Recent Labs  Lab 04/08/23 0115 04/09/23 0400 04/09/23 1601 04/10/23 0312  WBC 6.8 7.1  --  6.3  RBC 2.19* 2.42*  --  2.58*  HGB 7.3* 8.0* 8.2* 8.6*  HCT 23.3* 24.9* 25.9* 26.6*  MCV 106.4* 102.9*  --  103.1*  MCH 33.3 33.1  --  33.3  MCHC 31.3 32.1  --  32.3  RDW 23.5* 24.5*  --  23.3*  PLT 195 190  --  177   Thyroid No results for input(s): "TSH", "FREET4" in the last 168 hours.  BNP No results for input(s): "BNP", "PROBNP" in the last 168 hours.   DDimer No results for input(s): "DDIMER" in the last 168 hours.   Radiology    No results found.  Cardiac Studies   Right/ Left Cardiac Catheterization 09/27/2022:   Ost LAD to Prox LAD lesion is 100% stenosed.   Ramus lesion is 100% stenosed.   Prox RCA to Mid RCA lesion is 60% stenosed.    Dist RCA lesion is 90% stenosed.   Origin to Prox Graft lesion is 100% stenosed.   LIMA graft was visualized by angiography and is large.   RIMA graft was visualized by non-selective angiography and is normal in caliber.   The graft exhibits no disease.   The graft exhibits no disease.   LV end diastolic pressure is mildly elevated.   Hemodynamic findings consistent with mild pulmonary hypertension.   There is moderate aortic valve stenosis.   3 vessel occlusive CAD. The native LCx is widely patent LIMA to the LAD is patent RIMA to the PDA is patent SVG to the ramus intermediate is occluded. This appears to be chronic Mild to Moderate aortic stenosis. AV mean gradient 18.5 mm Hg. AVA 2.5 cm squared with index 1.15. Elevated LV filling pressures. LVEDP 20 mm Hg. Mean PCWP 24 mm Hg with large V waves to 42 mm Hg Mild pulmonary HTN. PAP 56/17 with mean 31 mm Hg.    Plan: medical management. Continue diuresis. Would benefit greatly from correction of severe anemia. Will need to monitor Aortic stenosis serially going forward.   Diagnostic Dominance: Right  _______________  Echocardiogram 04/02/2023: Impressions: 1. Left ventricular ejection fraction, by estimation, is 30 to 35%. The  left ventricle has moderately decreased function. The left ventricle  demonstrates global hypokinesis. The left ventricular internal cavity size  was mildly dilated. There is mild  concentric left ventricular hypertrophy. Left ventricular diastolic  parameters are indeterminate.   2. Right ventricular systolic function is mildly reduced. The right  ventricular size is mildly enlarged. Tricuspid regurgitation signal is  inadequate for assessing PA pressure.   3. Left atrial size was severely dilated.   4. Right atrial size was moderately dilated.   5. The mitral valve is normal in structure. Mild mitral valve  regurgitation. Mild mitral stenosis. The mean mitral valve gradient is 6.0  mmHg. Moderate  mitral annular calcification.   6. The aortic valve is tricuspid. There is moderate calcification of the  aortic valve. Aortic valve regurgitation is not visualized.  Mild aortic  valve stenosis. Aortic valve mean gradient measures 14.0 mmHg.   7. The inferior vena cava is dilated in size with <50% respiratory  variability, suggesting right atrial pressure of 15 mmHg.    Patient Profile     82 y.o. male with a history of CAD s/p remote CABG x3 in 2003 with subsequent BMS to SVG-RI in 2017 (last cath in 09/2022 showed 2/3 patent grafts with occluded SVG-RI), ischemic cardiomyopathy/ chronic HFpEF with EF of 55-60% on Echo in 09/2022, permanent atrial fibrillation and paroxysmal atrial flutter not on anticoagulation given GI bleeds and anemia, aortic stenosis, hypertension, hyperlipidemia, chronic venous stasis dermatitis, and GI bleeding with recurrent symptomatic anemia who presented on 03/31/2023 with weakness and was noted to be hypotension with a hemoglobin of 6.2. He was admitted for decompensated CHF and recurrent anemia. He was started on pressors and transfused with PRBC. Echo showed a new drop in EF to 30-35%. Cardiology was consulted for assistance.  Assessment & Plan    Acute on Chronic Combined Heart Failure Patient has a history of chronic HFpEF with EF of 55-60% on Echo in 09/2022. He presented with generalized and was found to be in decompensated CHF and severely anemic. BNP elevated at 1,121. Chest x-ray showed cardiomegaly with moderate pulmonary vascular congestion and bilateral effusions compatible with CHF. Echo showed LVEF of 30-35% with global hypokinesis and mild LVH, mildly reduced RV, biatrial enlargement (left > right), mild AS, and mild MR/ MS.  - Good response to IV Lasix 80mg  BID with metolazone 2.5 mg, net negative 2L yesterday.  Remains volume overloaded on exam with elevated JVD and LE edema, though improved.  Would continue IV lasix 80 mg twice daily today, may be able to  transition to PO torsemide tomorrow - GDMT limited due to hypotension. On low dose Lopressor for rate control, consolidated to toprol XL.  Continue farxiga 10 mg daily - Continue to monitor daily weights, strict I/Os, and renal function. - He is not a candidate for cardiac catheterization with his severe anemia.   Demand Ischemia CAD s/p CABG S/p remote CABG in 2003 with subsequent PCI with BMS to SVG-RI. Last cath in 09/2022 showed an occluded SVG to RI but patent LIMA to LAD and RIMA to PDA. Continued medical therapy recommended. High-sensitivity troponin peaked at 2,438 but could represent demand ischemia from decompensated CHF and severe anemia. Echo showed a drop in EF to 30-35% with global hypokinesis.. - No chest pain. - Has not been on aspirin given severe anemia, would restart once OK with hematology - Continue beta-blocker and high-intensity statin. - Not a candidate for cardiac catheterization at this time.   Permanent Atrial Fibrillation Rates mostly in the 80s to low 100s. - Continue Lopressor 12.5mg  twice daily. - CHA2DS2-VASc = 5 (CAD, CHF, HTN, age x2). Not a candidate for anticoagulation with severe anemia and GI bleeding. He has had CTA to assess for Watchman and can continue to follow-up with Dr. Lalla Brothers as an outpatient.   Hypotension He was hypotensive on arrival and initially required Levophed in addition to blood transfusion. BP remains low-normal but is stable. - Continue low dose metoprolol as above.   Hyperlipidemia - Continue Lipitor 80mg  daily.   Acute on Chronic Anemia Hemoglobin 6.2 on admission.  Has been as low as 5.0 12/2022.  Hemoccult positive. Recent EGD on 02/15/2023 showed gastritis and a non-bleeding duodenal ulcer with a clean ulcer base. Recent colonoscopy on 03/13/2023 showed non-bleeding internal hemorrhoid and two 3-53mm  polyps in the ascending colon and in the cecum which were removed with a cold snare. GI and Hematology consulted and anemia not felt  to be due to GI bleeding. Reticulocyte count has been low indicating a bone marrow production issue. S/p bone marrow biopsy on 8/1, consistent with MDS per hematology - Hemoglobin improved to 8.6 after 1 unit PRBCs 8/5 and 1 unit 8/6.  Starting on ESA per Heme - Management per Internal Medicine and Hematology.   AKI on CKD Stage III Creatinine 1.94 on admission. Baseline around 0.9 to 1.2. Likely due to hypotension and decompensated CHF. Improved. - Creatinine 1.16  today - Continue to monitor with diuresis.    For questions or updates, please contact [facility] Please consult [email or web address] for contact info under        Signed, Little Ishikawa, MD  04/12/2023, 7:35 AM

## 2023-04-13 DIAGNOSIS — I4821 Permanent atrial fibrillation: Secondary | ICD-10-CM | POA: Diagnosis not present

## 2023-04-13 DIAGNOSIS — I5043 Acute on chronic combined systolic (congestive) and diastolic (congestive) heart failure: Secondary | ICD-10-CM | POA: Diagnosis not present

## 2023-04-13 DIAGNOSIS — E7849 Other hyperlipidemia: Secondary | ICD-10-CM | POA: Diagnosis not present

## 2023-04-13 DIAGNOSIS — D649 Anemia, unspecified: Secondary | ICD-10-CM | POA: Diagnosis not present

## 2023-04-13 DIAGNOSIS — I5021 Acute systolic (congestive) heart failure: Secondary | ICD-10-CM | POA: Diagnosis not present

## 2023-04-13 DIAGNOSIS — I2510 Atherosclerotic heart disease of native coronary artery without angina pectoris: Secondary | ICD-10-CM | POA: Diagnosis not present

## 2023-04-13 DIAGNOSIS — D469 Myelodysplastic syndrome, unspecified: Secondary | ICD-10-CM | POA: Diagnosis not present

## 2023-04-13 DIAGNOSIS — N179 Acute kidney failure, unspecified: Secondary | ICD-10-CM | POA: Diagnosis not present

## 2023-04-13 DIAGNOSIS — Z5309 Procedure and treatment not carried out because of other contraindication: Secondary | ICD-10-CM

## 2023-04-13 LAB — CBC
HCT: 23.8 % — ABNORMAL LOW (ref 39.0–52.0)
Hemoglobin: 7.8 g/dL — ABNORMAL LOW (ref 13.0–17.0)
MCH: 33.6 pg (ref 26.0–34.0)
MCHC: 32.8 g/dL (ref 30.0–36.0)
MCV: 102.6 fL — ABNORMAL HIGH (ref 80.0–100.0)
Platelets: 178 10*3/uL (ref 150–400)
RBC: 2.32 MIL/uL — ABNORMAL LOW (ref 4.22–5.81)
RDW: 22.5 % — ABNORMAL HIGH (ref 11.5–15.5)
WBC: 6.3 10*3/uL (ref 4.0–10.5)
nRBC: 0 % (ref 0.0–0.2)

## 2023-04-13 LAB — BASIC METABOLIC PANEL
Anion gap: 11 (ref 5–15)
BUN: 25 mg/dL — ABNORMAL HIGH (ref 8–23)
CO2: 30 mmol/L (ref 22–32)
Calcium: 8.2 mg/dL — ABNORMAL LOW (ref 8.9–10.3)
Chloride: 90 mmol/L — ABNORMAL LOW (ref 98–111)
Creatinine, Ser: 1.16 mg/dL (ref 0.61–1.24)
GFR, Estimated: >60 mL/min (ref >60)
Glucose, Bld: 123 mg/dL — ABNORMAL HIGH (ref 70–99)
Potassium: 3.3 mmol/L — ABNORMAL LOW (ref 3.5–5.1)
Sodium: 131 mmol/L — ABNORMAL LOW (ref 135–145)

## 2023-04-13 MED ORDER — POTASSIUM CHLORIDE CRYS ER 20 MEQ PO TBCR
40.0000 meq | EXTENDED_RELEASE_TABLET | Freq: Once | ORAL | Status: AC
  Administered 2023-04-13: 40 meq via ORAL
  Filled 2023-04-13: qty 2

## 2023-04-13 MED ORDER — TORSEMIDE 20 MG PO TABS
20.0000 mg | ORAL_TABLET | Freq: Two times a day (BID) | ORAL | Status: DC
  Administered 2023-04-13 – 2023-04-14 (×3): 20 mg via ORAL
  Filled 2023-04-13 (×3): qty 1

## 2023-04-13 MED ORDER — POTASSIUM CHLORIDE CRYS ER 20 MEQ PO TBCR
40.0000 meq | EXTENDED_RELEASE_TABLET | Freq: Once | ORAL | Status: AC
  Administered 2023-04-14: 40 meq via ORAL
  Filled 2023-04-13: qty 2

## 2023-04-13 MED ORDER — POTASSIUM CHLORIDE 10 MEQ/100ML IV SOLN
10.0000 meq | INTRAVENOUS | Status: AC
  Administered 2023-04-13 (×3): 10 meq via INTRAVENOUS
  Filled 2023-04-13 (×2): qty 100

## 2023-04-13 NOTE — Progress Notes (Signed)
Patient had three runs of IV potassium today at 10 mEq each. Lab drew again. Potassium still slightly low at 3.3. Dr. Arlean Hopping paged to alert.

## 2023-04-13 NOTE — Progress Notes (Signed)
[facility] night cross cover note:   I was notified by RN that the patient's potassium level is low at 3.3. Most recent mag level was 2.1, which drawn at 0100 on 8/10.   This is relative to most recent prior potassium level of 2.6, which was drawn around 1 AM on 04/13/2023.  In the interval he has now received a total of 110 mEq of potassium supplementation (80 meq PO and 30 meq IV).   In the setting of a less than anticipated response in serum potassium level trend to this degree of interval supplementation, I have ordered Kcl 40 meq po q4 hours x 2 doses, and confirmed existing orders for repeat BMP and serum magnesium level to be checked with morning labs on 04/14/2023.    Leonard Pigg, DO Hospitalist

## 2023-04-13 NOTE — Progress Notes (Addendum)
Progress Note  Patient Name: Leonard Cisneros Date of Encounter: 04/13/2023  Primary Cardiologist: [name], MD  Subjective   No acute events overnight, no symptoms.  No active bleeding.  CBC pending.  Serum potassium significantly low, primary team repeating.  Inpatient Medications    Scheduled Meds:  sodium chloride   Intravenous Once   atorvastatin  80 mg Oral Daily   Chlorhexidine Gluconate Cloth  6 each Topical Daily   dapagliflozin propanediol  10 mg Oral Daily   feeding supplement  1 Container Oral TID BM   furosemide  80 mg Intravenous BID   metoprolol succinate  25 mg Oral Daily   multivitamin  1 tablet Oral QHS   pantoprazole  40 mg Oral BID   polyethylene glycol  17 g Oral BID   potassium chloride SA  40 mEq Oral Daily   simethicone  80 mg Oral QID   Continuous Infusions:  sodium chloride     PRN Meds: docusate sodium, melatonin, mouth rinse   Vital Signs    Vitals:   04/12/23 2017 04/12/23 2337 04/13/23 0309 04/13/23 0803  BP: (!) 95/56 (!) 90/48 99/61 (!) 92/44  Pulse: 95  85   Resp: 20  20 (!) 28  Temp: (!) 97.5 F (36.4 C) 98.2 F (36.8 C) 97.8 F (36.6 C) 97.8 F (36.6 C)  TempSrc: Oral Oral Oral Oral  SpO2: 93% 92% 94% 94%  Weight:      Height:        Intake/Output Summary (Last 24 hours) at 04/13/2023 0905 Last data filed at 04/13/2023 0311 Gross per 24 hour  Intake 560 ml  Output 1525 ml  Net -965 ml   Filed Weights   04/10/23 0433 04/11/23 0629 04/12/23 0500  Weight: 89.1 kg 88.5 kg 87.3 kg    Telemetry     Personally reviewed, in A-fib, PVC.  ECG    Not performed today  Physical Exam   GEN: No acute distress.   Neck: No JVD. Cardiac: RRR, no murmur, rub, or gallop.  Respiratory: Nonlabored. Clear to auscultation bilaterally. GI: Soft, nontender, bowel sounds present. MS: No edema; No deformity. Neuro:  Nonfocal. Psych: Alert and oriented x 3. Normal affect.  Labs    Chemistry Recent Labs  Lab  04/08/23 0115 04/09/23 0400 04/11/23 0313 04/12/23 0037 04/13/23 0109  NA 131*   < > 132* 131* 130*  K 3.8   < > 3.7 3.4* 2.6*  CL 96*   < > 97* 93* 90*  CO2 26   < > 27 26 28   GLUCOSE 93   < > 98 100* 89  BUN 16   < > 21 24* 24*  CREATININE 1.06   < > 1.00 1.16 1.11  CALCIUM 8.1*   < > 8.5* 8.5* 8.3*  ALBUMIN 2.9*  --   --   --   --   GFRNONAA >60   < > >60 >60 >60  ANIONGAP 9   < > 8 12 12    < > = values in this interval not displayed.     Hematology Recent Labs  Lab 04/08/23 0115 04/09/23 0400 04/09/23 1601 04/10/23 0312  WBC 6.8 7.1  --  6.3  RBC 2.19* 2.42*  --  2.58*  HGB 7.3* 8.0* 8.2* 8.6*  HCT 23.3* 24.9* 25.9* 26.6*  MCV 106.4* 102.9*  --  103.1*  MCH 33.3 33.1  --  33.3  MCHC 31.3 32.1  --  32.3  RDW 23.5*  24.5*  --  23.3*  PLT 195 190  --  177    Cardiac Enzymes Recent Labs  Lab 03/31/23 1004 03/31/23 1416 03/31/23 2133 03/31/23 2354 04/01/23 0542  TROPONINIHS 2,150* 1,742* 2,178* 2,438* 2,427*    BNPNo results for input(s): "BNP", "PROBNP" in the last 168 hours.   DDimerNo results for input(s): "DDIMER" in the last 168 hours.    Assessment & Plan    Acute on chronic systolic and diastolic heart failure: LVEF was 55 to 60% in 1/24 but in the current admission LVEF decreased to 30 to 35%, has mild RV systolic dysfunction, biatrial enlargement (L>R), has mild AS and mild MR/MS. on IV Lasix 80 mg twice daily with adequate diuretic response, serum creatinine significantly improved, will switch IV Lasix to p.o. torsemide 20 mg twice daily. Continue Farxiga 10 mg once daily and metoprolol succinate 25 mg once daily.  Further up titration/addition of GDMT is limited due to hypotension/soft BPs. Would not recommend LHC (inpatient/outpatient) due to severe anemia from MDS. Can consider outpatient stress testing but this would not change the management either.  Severe hypokalemia, K2.6: IV K x 3, repeat BMP in the afternoon.  Discontinued IV Lasix and  started p.o. torsemide but torsemide will need to be administered only if serum K is more than 3.5.  Will need to be discharged on potassium chloride supplements.  CAD s/p CABG in 2003 with subsequent PCI with BMS to SVG-RI: Last cath in 1/24 showed an occluded SVG to RI with patent IMA to LAD and RIMA to PDA. Recommended to continue medical therapy.  Troponin peaked at 2438 but this could be demand ischemia from decompensated CHF and severe anemia.  Echo showed a drop in the LVEF to 30 to 35% with global hypokinesis.  No angina. Aspirin discontinued due to severe anemia requiring PRBC transfusion. EGD also showed gastritis and clean-based stomach ulcers. I would hold off on starting aspirin due to MDS and EGD findings. Not a candidate for LHC at this time.  Permanent A-fib (CV score 5): Continue metoprolol succinate 25 mg once daily, not a candidate for systemic AC at this time.  He has had CTA to assess for watchman but I doubt if he will be a candidate for watchman due to severe anemia from MDS.  Prognosis and risk stratification of MDS is based on cytogenetics, he will be following with hematology for this.  AKI on CKD stage III likely secondary to cardiorenal, serum creatinine significantly improved with IV diuresis.  Serum creatinine 1.1 this a.m.  Acute on chronic anemia, hemoglobin 6 point on admission, has been as low as 5 in 4/24.  Hemoccult positive.  EGD on 6/24 showed gastritis and nonbleeding duodenal ulcer with a clean ulcer base.  Colonoscopy in 7/24 showed nonbleeding internal hemorrhoids and polyps which were removed.  Reticulocyte count has been low indicating a bone marrow production issue, status post bone marrow biopsy on 8/1, consistent with MDS per hematology. Starting on ESA per him.  HLD: Continue Lipitor 80 mg at bedtime.   [facility] will sign off.   Medication Recommendations: Metoprolol succinate 25 mg once daily, torsemide 20 mg twice daily, Farxiga 10 mg once daily,  atorvastatin 80 mg nightly, KCl 40 mg twice daily. Other recommendations (labs, testing, etc): BMP in 3 days Follow up as an outpatient: Cardiology follow-up in 2 weeks   Signed,  P , MD  04/13/2023, 9:05 AM

## 2023-04-13 NOTE — Progress Notes (Signed)
Progress Note   Patient: Leonard Cisneros NWG:[id number] DOB: 08-Aug-1941 DOA: 03/31/2023     13 DOS: the patient was seen and examined on 04/13/2023   Brief hospital course: Mr. Leonard Cisneros was admitted with the working diagnosis of heart failure exacerbation in the setting of severe symptomatic anemia.   82 year old man with increasing lethargy and dyspnea at home. He was taken to [facility] ED where he was found to have hemoglobin of 6.2 along with hypotension with systolic pressure down into the 80s requiring norepinephrine.  Transferred from [facility] to [facility] for ICU admission. At the time of his transfer his blood pressure was 92/60, HR 78, RR 21 and 02 saturation 100%, lungs with no wheezing or rales, heart with S1 and S2 present and rhythmic, abdomen with no distention and positive lower extremity edema. Venous stasis ulcers to the level of the knee with open superficial ulcers.   Na 130, K 4,1 CL 98, bicarbonate 25 glucose 115 bun 38 cr 1,94  AST 64 ALT 41  BNP 1,121 High sensitive troponin 2,150, 1,742, 2,178, 2,438, 2,427   Lactic acid 3,1  Wbc 11.9 hgb 6,2 plt 191  Fecal occult positive   Chest radiograph with cardiomegaly, bilateral hilar vascular congestion, bilateral pleural effusions, more right than left. Sternotomy wires in place.   EKG 92 bpm, left axis deviation, normal intervals, atrial fibrillation rhythm with no significant ST segment changes, negative T wave lead V2 and V3.   Patient had PRBC transfusions.  Echocardiogram with reduced LV systolic function. 07/29 norepinephrine weaned off Placed on furosemide for diuresis.   07/31 transfer to Leonard Cisneros.   Assessment and Plan: Acute on chronic combined systolic and diastolic CHF (congestive heart failure) (HCC) Echocardiogram with reduced LV systolic function with EF 30 to 35%, global hypokinesis, mild LVH, RV systolic function mildly reduced, RV cavity with mild enlargement, LA with severe dilatation, RA with moderate  dilatation, no significant valvular disease.   Urine output is 1,525 ml Systolic blood pressure 99 to 92 mmHg.   08/08 metolazone 2.5 mg   Loop diuretic with torsemide 20 mg po bid.  SGLT 2 inh to augment diuresis.  Continue diuresis with  Continue low dose metoprolol.  Holding RAAS due to risk of hypotension.   Anemia Severe symptomatic anemia.  Sp PRBC transfusion x 4  History of mild gastritis and clean bases 3 mm duodenal ulcer. Recent colonoscopy with polyps (removed).  Positive iron deficiency anemia with bone marrow hypercellular (biopsy 07.10.24).   Atrial fibrillation (HCC) Continue reate control with metoprolol. Holding on anticoagulation due to severe anemia.  AKI (acute kidney injury) (HCC) Hypokalemia. Hyponatremia.  Continue to improve volume status, renal function today with serum cr at 1,1 with K at 2,6 and serum bicarbonate at 28. Na 130  Plan to add KCl 40 meq today and check BMP this pm. Goal K 4.  Follow up renal function in am.   Coronary artery disease SP CABG.  Troponin elevation due to demand ischemia.  Continue supportive medical therapy and keep Hgb at 8 or greater.   Dyslipidemia.  Continue with statin therapy.     Subjective: Patient is feeling better, dyspnea has improved along with edema, no chest pain   Physical Exam: Vitals:   04/12/23 2337 04/13/23 0309 04/13/23 0803 04/13/23 1117  BP: (!) 90/48 99/61 (!) 92/44 (!) 90/50  Pulse:  85    Resp:  20 (!) 28 (!) 21  Temp: 98.2 F (36.8 C) 97.8 F (36.6 C)  97.8 F (36.6 C) 97.8 F (36.6 C)  TempSrc: Oral Oral Oral Oral  SpO2: 92% 94% 94% 96%  Weight:      Height:       Neurology awake and alert ENT with mild pallor Cardiovascular with S1 and S2 present, irregularly irregular with systolic murmur at the apex, no gallops No JVD Positive lower extremity edema + Respiratory with no rales or wheezing Abdomen with no distention  Data Reviewed:    Family Communication: I  spoke with patient's wife over the phone at the bedside, we talked in detail about patient's condition, plan of care and prognosis and all questions were addressed.   Disposition: Status is: Inpatient Remains inpatient appropriate because: pending correction of K.   Planned Discharge Destination: Home      Author: Coralie Keens, MD 04/13/2023 1:09 PM  For on call review [email or web address].

## 2023-04-13 NOTE — Progress Notes (Signed)
[facility] night cross cover note:   I was notified by RN that the patient's potassium level is 2.6, down from yesterday's value of 3.4.  Corresponding magnesium level is 2.1.  I subsequently placed order for potassium chloride 40 meq p.o. x 1 dose now.  This is in addition to the existing order for potassium chloride 40 meq p.o. daily, which the patient will receive later this morning.      Newton Pigg, DO Hospitalist

## 2023-04-14 ENCOUNTER — Other Ambulatory Visit: Payer: Self-pay | Admitting: Hematology

## 2023-04-14 DIAGNOSIS — I5043 Acute on chronic combined systolic (congestive) and diastolic (congestive) heart failure: Secondary | ICD-10-CM | POA: Diagnosis not present

## 2023-04-14 DIAGNOSIS — I4821 Permanent atrial fibrillation: Secondary | ICD-10-CM | POA: Diagnosis not present

## 2023-04-14 DIAGNOSIS — N179 Acute kidney failure, unspecified: Secondary | ICD-10-CM | POA: Diagnosis not present

## 2023-04-14 DIAGNOSIS — D649 Anemia, unspecified: Secondary | ICD-10-CM | POA: Diagnosis not present

## 2023-04-14 LAB — BASIC METABOLIC PANEL WITH GFR
Anion gap: 14 (ref 5–15)
BUN: 26 mg/dL — ABNORMAL HIGH (ref 8–23)
CO2: 28 mmol/L (ref 22–32)
Calcium: 8.4 mg/dL — ABNORMAL LOW (ref 8.9–10.3)
Chloride: 90 mmol/L — ABNORMAL LOW (ref 98–111)
Creatinine, Ser: 1.15 mg/dL (ref 0.61–1.24)
GFR, Estimated: >60 mL/min (ref >60)
Glucose, Bld: 63 mg/dL — ABNORMAL LOW (ref 70–99)
Potassium: 3.3 mmol/L — ABNORMAL LOW (ref 3.5–5.1)
Sodium: 132 mmol/L — ABNORMAL LOW (ref 135–145)

## 2023-04-14 LAB — MAGNESIUM: Magnesium: 2 mg/dL (ref 1.7–2.4)

## 2023-04-14 MED ORDER — RENA-VITE PO TABS: 1.0000 | ORAL_TABLET | Freq: Every day | ORAL | 0 refills | Status: AC

## 2023-04-14 MED ORDER — METOPROLOL SUCCINATE ER 25 MG PO TB24: 25.0000 mg | ORAL_TABLET | Freq: Every day | ORAL | 0 refills | Status: AC

## 2023-04-14 MED ORDER — POTASSIUM CHLORIDE CRYS ER 20 MEQ PO TBCR
40.0000 meq | EXTENDED_RELEASE_TABLET | ORAL | Status: DC
  Administered 2023-04-14: 40 meq via ORAL
  Filled 2023-04-14: qty 2

## 2023-04-14 MED ORDER — TORSEMIDE 20 MG PO TABS: 40.0000 mg | ORAL_TABLET | Freq: Every day | ORAL | Status: DC

## 2023-04-14 NOTE — Discharge Summary (Signed)
Physician Discharge Summary   Patient: Leonard Cisneros MRN: [id number] DOB: 10/19/1940  Admit date:     03/31/2023  Discharge date: 04/14/23  Discharge Physician: York Ram    PCP: [name], FNP   Recommendations at discharge:    Metoprolol has been changed to succinate from tartrate, continue with 25 mg. Diuresis with torsemide 40 mg daily, with K supplementation. Follow up renal function and electrolytes within 7 days. Follow up with Hematology (bone marrow results). Follow up with Cardiology as scheduled Follow up with [name] FNP in 7 to 10 days.   Discharge Diagnoses: Active Problems:   Acute on chronic combined systolic and diastolic CHF (congestive heart failure) (HCC)   Anemia   Atrial fibrillation (HCC)   AKI (acute kidney injury) (HCC)   Coronary artery disease  Resolved Problems:   * No resolved hospital problems. [facility] Course: Leonard Cisneros was admitted with the working diagnosis of heart failure exacerbation in the setting of severe symptomatic anemia.   82 year old man with increasing lethargy and dyspnea at home. [facility] was taken to [facility] where he was found to have hemoglobin of 6.2 along with hypotension with systolic pressure down into the 80s requiring norepinephrine.  Transferred from [facility] to [facility]. At the time of his transfer his blood pressure was 92/60, HR 78, RR 21 and 02 saturation 100%, lungs with no wheezing or rales, heart with S1 and S2 present and rhythmic, abdomen with no distention and positive lower extremity edema. Venous stasis ulcers to the level of the knee with open superficial ulcers.   Na 130, K 4,1 CL 98, bicarbonate 25 glucose 115 bun 38 cr 1,94  AST 64 ALT 41  BNP 1,121 High sensitive troponin 2,150, 1,742, 2,178, 2,438, 2,427   Lactic acid 3,1  Wbc 11.9 hgb 6,2 plt 191  Fecal occult positive   Chest radiograph with cardiomegaly, bilateral hilar vascular congestion, bilateral pleural effusions,  more right than left. Sternotomy wires in place.   EKG 92 bpm, left axis deviation, normal intervals, atrial fibrillation rhythm with no significant ST segment changes, negative T wave lead V2 and V3.   Patient had PRBC transfusions.  Echocardiogram with reduced LV systolic function. 07/29 norepinephrine weaned off Placed on furosemide for diuresis.   07/31 transfer to [facility].  Patient continue aggressive diuresis with good toleration. His anemia remained stable after PRBC transfusions with no signs of bleeding.  Bone marrow biopsy hypercellular bone marrow with mild dyspoiesis, will follow up with hematology as outpatient.  08/11 volume status has improved, plan to continue diuresis at home with oral torsemide. Follow up as outpatient with cardiology, hematology and primary care.   Assessment and Plan: Acute on chronic combined systolic and diastolic CHF (congestive heart failure) (HCC) Echocardiogram with reduced LV systolic function with EF 30 to 35%, global hypokinesis, mild LVH, RV systolic function mildly reduced, RV cavity with mild enlargement, LA with severe dilatation, RA with moderate dilatation, no significant valvular disease.   Patient was placed on IV furosemide and received one dose of metolazone for diuresis, negative fluid balance was achieved, - 9,012 ml, with significant improvement in his symptoms.   Plan to continue metoprolol, diuretics with SGLT 2 inh and torsemide.  Holding RAAS due to risk of hypotension.   Follow up with heart failure as outpatient.   Anemia Severe symptomatic anemia.  Sp PRBC transfusion x 4  History of mild gastritis and clean bases 3 mm duodenal ulcer. Recent colonoscopy with  polyps (removed).  Positive iron deficiency anemia with bone marrow hypercellular (biopsy 07.10.24).   Plan to follow up with hematology as outpatient.  His discharge hgb is 7,8.   Atrial fibrillation (HCC) Continue reate control with metoprolol. Holding on  anticoagulation due to severe anemia.  AKI (acute kidney injury) (HCC) Hypokalemia. Hyponatremia.  Volume status has improved, renal function at the time of his discharge with serum cr at 1.15 with K at 3,3 and serum bicarbonate at 28. Na 132. Mg 2.0   Patient has received Kcl prior to his discharge. Plan to continue torsemide with Kcl supplementation. Now that K had more repletion, will continue with 20 meq daily to prevent hypoerkalemia.  Follow up renal function and electrolytes as outpatient in 7 days.   Coronary artery disease SP CABG.  Troponin elevation due to demand ischemia.  Continue supportive medical therapy. Follow up hgb as outpatient.   Dyslipidemia.  Continue with statin therapy.         Consultants: cardiology, hematology, GI, IR, wound care  Procedures performed: bone marrow biopsy   Disposition: Home Diet recommendation:  Discharge Diet Orders (From admission, onward)     Start     Ordered   04/14/23 0000  Diet - low sodium heart healthy        04/14/23 1202           Cardiac diet DISCHARGE MEDICATION: Allergies as of 04/14/2023       Reactions   Augmentin [amoxicillin-pot Clavulanate] Nausea And Vomiting, Other (See Comments)   Has patient had a PCN reaction causing immediate rash, facial/tongue/throat swelling, SOB or lightheadedness with hypotension: Unknown Has patient had a PCN reaction causing severe rash involving mucus membranes or skin necrosis: No Has patient had a PCN reaction that required hospitalization: No Has patient had a PCN reaction occurring within the last 10 years: No If all of the above answers are "NO", then may proceed with Cephalosporin use.        Medication List     STOP taking these medications    metoprolol tartrate 25 MG tablet Commonly known as: LOPRESSOR       TAKE these medications    acetaminophen 325 MG tablet Commonly known as: TYLENOL Take 2 tablets (650 mg total) by mouth every 6 (six)  hours as needed for mild pain (or Fever >/= 101).   atorvastatin 80 MG tablet Commonly known as: LIPITOR TAKE 1 TABLET BY MOUTH EVERY DAY   Farxiga 10 MG Tabs tablet Generic drug: dapagliflozin propanediol Take 10 mg by mouth daily.   Iron (Ferrous Sulfate) 325 (65 Fe) MG Tabs Take 325 mg by mouth daily.   metoprolol succinate 25 MG 24 hr tablet Commonly known as: TOPROL-XL Take 1 tablet (25 mg total) by mouth daily. Start taking on: April 15, 2023   multivitamin Tabs tablet Take 1 tablet by mouth at bedtime.   nitroGLYCERIN 0.4 MG SL tablet Commonly known as: NITROSTAT Place 1 tablet (0.4 mg total) under the tongue every 5 (five) minutes x 3 doses as needed for chest pain (if no relief after 3rd dose, proceed to the ED for an evaluation or call 911).   pantoprazole 40 MG tablet Commonly known as: Protonix Take 1 tablet (40 mg total) by mouth 2 (two) times daily.   Potassium Chloride ER 20 MEQ Tbcr Take 1 tablet (20 mEq total) by mouth daily. 1 tab daily by mouth   silver sulfADIAZINE 1 % cream Commonly known as: SILVADENE Apply 1  Application topically daily.   torsemide 20 MG tablet Commonly known as: DEMADEX Take 2 tablets (40 mg total) by mouth daily. What changed: when to take this               Discharge Care Instructions  (From admission, onward)           Start     Ordered   04/14/23 0000  Discharge wound care:       Comments: 1. Apply Xeroform gauze and ABD pads and kerlex to right leg Q day 2. Foam dressing to left leg, change Q 3 days or PRN soiling   04/14/23 1202            Follow-up Information     [facility]. Go in 12 day(s).   Specialty: Cardiology Why: Hospital follow up 04/17/2023 @ 12 noon PLEASE bring a current medication list to appointment FREE valet parking, Entrance C, off [employer] information: [address] [place]  [address] [phone]        [facility] Follow up.   Specialty: Home Health Services Why: Home heath has been arranged. They will contact you to schedule apt within 48hrs post discharge. Contact information: [address] [place] [address] [phone]         [name], MD Follow up on 04/23/2023.   Specialty: Cardiology Why: Cardiology Hospital Follow-up on 04/23/2023 at 2:00 PM. Contact information: [address] [place] [address] [phone]                Discharge Exam: Filed Weights   04/11/23 0629 04/12/23 0500 04/14/23 0543  Weight: 88.5 kg 87.3 kg 87.3 kg   BP (!) 90/55 (BP Location: Left Arm)   Pulse 67   Temp 98.2 F (36.8 C) (Oral)   Resp 16   Ht 5\' 11"  (1.803 m)   Wt 87.3 kg   SpO2 97%   BMI 26.84 kg/m   Patient with feeling better, no dyspnea or chest pain, his lower extremity edema has improved.  Neurology awake and alert ENT with no pallor Cardiovascular with S1 and S2 present irregularly irregular with no gallops, or rubs, positive systolic murmur at the apex No JVD Lower extremity trace.  Respiratory with no rales or wheezing, no rhonchi Abdomen with no distention  Right leg with dressing in place,   Condition at discharge: stable  The results of significant diagnostics from this hospitalization (including imaging, microbiology, ancillary and laboratory) are listed below for reference.   Imaging Studies: CT BONE MARROW BIOPSY & ASPIRATION  Result Date: 04/04/2023 CLINICAL DATA:  Unexplained anemia and need for bone marrow biopsy. EXAM: CT GUIDED BONE MARROW ASPIRATION AND BIOPSY ANESTHESIA/SEDATION: Moderate (conscious) sedation was employed during this procedure. A total of Versed 1.0 mg and Fentanyl 75 mcg was administered intravenously by radiology nursing. Moderate Sedation Time: 13 minutes. The patient's level of consciousness and vital signs were monitored continuously by radiology nursing  throughout the procedure under my direct supervision. PROCEDURE: The procedure risks, benefits, and alternatives were explained to the patient. Questions regarding the procedure were encouraged and answered. The patient understands and consents to the procedure. A time out was performed prior to initiating the procedure. The right gluteal region was prepped with chlorhexidine. Sterile gown and sterile gloves were used for the procedure. Local anesthesia was provided with 1% Lidocaine. Under CT guidance, an 11 gauge On Control bone  cutting needle was advanced from a posterior approach into the right iliac bone. Needle positioning was confirmed with CT. Initial non heparinized and heparinized aspirate samples were obtained of bone marrow. Core biopsy was performed via the On Control drill needle. COMPLICATIONS: None FINDINGS: Inspection of initial aspirate did reveal visible particles. Intact core biopsy sample was obtained. IMPRESSION: CT guided bone marrow biopsy of right posterior iliac bone with both aspirate and core samples obtained. Electronically Signed   By: Irish Lack M.D.   On: 04/04/2023 10:45   ECHOCARDIOGRAM COMPLETE  Result Date: 04/02/2023    ECHOCARDIOGRAM REPORT   Patient Name:   Leonard Cisneros Date of Exam: 04/02/2023 Medical Rec #:  [id number]        Height:       71.0 in Accession #:    [id number]       Weight:       198.6 lb Date of Birth:  1941/06/05       BSA:          2.102 m Patient Age:    81 years         BP:           110/74 mmHg Patient Gender: M                HR:           100 bpm. Exam Location:  Inpatient Procedure: 2D Echo, Cardiac Doppler and Color Doppler Indications:    CHF I50.21  History:        Patient has prior history of Echocardiogram examinations, most                 recent 09/25/2022. CAD, ETOH abuse, Arrythmias:Atrial                 Fibrillation; Risk Factors:Hypertension, Dyslipidemia and Former                 Smoker.  Sonographer:    Dondra Prader RVT RCS  Referring Phys: [phone] WHITNEY D HARRIS IMPRESSIONS  1. Left ventricular ejection fraction, by estimation, is 30 to 35%. The left ventricle has moderately decreased function. The left ventricle demonstrates global hypokinesis. The left ventricular internal cavity size was mildly dilated. There is mild concentric left ventricular hypertrophy. Left ventricular diastolic parameters are indeterminate.  2. Right ventricular systolic function is mildly reduced. The right ventricular size is mildly enlarged. Tricuspid regurgitation signal is inadequate for assessing PA pressure.  3. Left atrial size was severely dilated.  4. Right atrial size was moderately dilated.  5. The mitral valve is normal in structure. Mild mitral valve regurgitation. Mild mitral stenosis. The mean mitral valve gradient is 6.0 mmHg. Moderate mitral annular calcification.  6. The aortic valve is tricuspid. There is moderate calcification of the aortic valve. Aortic valve regurgitation is not visualized. Mild aortic valve stenosis. Aortic valve mean gradient measures 14.0 mmHg.  7. The inferior vena cava is dilated in size with <50% respiratory variability, suggesting right atrial pressure of 15 mmHg. FINDINGS  Left Ventricle: Left ventricular ejection fraction, by estimation, is 30 to 35%. The left ventricle has moderately decreased function. The left ventricle demonstrates global hypokinesis. The left ventricular internal cavity size was mildly dilated. There is mild concentric left ventricular hypertrophy. Left ventricular diastolic parameters are indeterminate. Right Ventricle: The right ventricular size is mildly enlarged. No increase in right ventricular wall thickness. Right ventricular systolic function is mildly reduced. Tricuspid regurgitation signal is inadequate for  assessing PA pressure. Left Atrium: Left atrial size was severely dilated. Right Atrium: Right atrial size was moderately dilated. Pericardium: There is no evidence of  pericardial effusion. Mitral Valve: The mitral valve is normal in structure. There is mild calcification of the mitral valve leaflet(s). Moderate mitral annular calcification. Mild mitral valve regurgitation. Mild mitral valve stenosis. MV peak gradient, 11.6 mmHg. The mean mitral valve gradient is 6.0 mmHg. Tricuspid Valve: The tricuspid valve is normal in structure. Tricuspid valve regurgitation is trivial. Aortic Valve: The aortic valve is tricuspid. There is moderate calcification of the aortic valve. Aortic valve regurgitation is not visualized. Mild aortic stenosis is present. Aortic valve mean gradient measures 14.0 mmHg. Aortic valve peak gradient measures 24.1 mmHg. Aortic valve area, by VTI measures 2.13 cm. Pulmonic Valve: The pulmonic valve was normal in structure. Pulmonic valve regurgitation is not visualized. Aorta: The aortic root is normal in size and structure. Venous: The inferior vena cava is dilated in size with less than 50% respiratory variability, suggesting right atrial pressure of 15 mmHg. IAS/Shunts: No atrial level shunt detected by color flow Doppler.  LEFT VENTRICLE PLAX 2D LVIDd:         6.10 cm LVIDs:         5.20 cm LV PW:         1.40 cm LV IVS:        1.30 cm LVOT diam:     2.20 cm LV SV:         86 LV SV Index:   41 LVOT Area:     3.80 cm  RIGHT VENTRICLE            IVC RV Basal diam:  4.50 cm    IVC diam: 3.20 cm RV Mid diam:    3.00 cm RV S prime:     8.51 cm/s TAPSE (M-mode): 0.8 cm LEFT ATRIUM              Index        RIGHT ATRIUM           Index LA diam:        5.90 cm  2.81 cm/m   RA Area:     27.20 cm LA Vol (A2C):   169.0 ml 80.38 ml/m  RA Volume:   96.85 ml  46.07 ml/m LA Vol (A4C):   140.0 ml 66.59 ml/m LA Biplane Vol: 169.0 ml 80.38 ml/m  AORTIC VALVE                     PULMONIC VALVE AV Area (Vmax):    2.32 cm      PV Vmax:       0.84 m/s AV Area (Vmean):   2.31 cm      PV Peak grad:  2.9 mmHg AV Area (VTI):     2.13 cm AV Vmax:           245.33 cm/s AV  Vmean:          168.667 cm/s AV VTI:            0.401 m AV Peak Grad:      24.1 mmHg AV Mean Grad:      14.0 mmHg LVOT Vmax:         149.75 cm/s LVOT Vmean:        102.375 cm/s LVOT VTI:          0.225 m LVOT/AV VTI ratio: 0.56  AORTA Ao Root diam:  3.20 cm Ao Asc diam:  3.50 cm MITRAL VALVE MV Area (PHT): 4.94 cm     SHUNTS MV Area VTI:   3.09 cm     Systemic VTI:  0.22 m MV Peak grad:  11.6 mmHg    Systemic Diam: 2.20 cm MV Mean grad:  6.0 mmHg MV Vmax:       1.70 m/s MV Vmean:      110.0 cm/s MV Decel Time: 154 msec MR Peak grad: 88.4 mmHg MR Mean grad: 61.0 mmHg MR Vmax:      470.00 cm/s MR Vmean:     368.0 cm/s MV E velocity: 152.00 cm/s MV A velocity: 37.70 cm/s MV E/A ratio:  4.03 Dalton McleanMD Electronically signed by Wilfred Lacy Signature Date/Time: 04/02/2023/4:37:17 PM    Final    DG Chest Port 1 View  Result Date: 03/31/2023 CLINICAL DATA:  Questionable sepsis.  Evaluate for abnormality. EXAM: PORTABLE CHEST 1 VIEW COMPARISON:  One-view chest 02/14/2023 FINDINGS: Heart is enlarged. Moderate pulmonary vascular congestion is present. Bilateral effusions are similar the prior exam. IMPRESSION: Cardiomegaly with moderate pulmonary vascular congestion and bilateral effusions compatible with congestive heart failure. Electronically Signed   By: Marin Roberts M.D.   On: 03/31/2023 10:32    Microbiology: Results for orders placed or performed during the hospital encounter of 03/31/23  Blood Culture (routine x 2)     Status: None   Collection Time: 03/31/23 10:04 AM   Specimen: Right Antecubital; Blood  Result Value Ref Range Status   Specimen Description   Final    RIGHT ANTECUBITAL BOTTLES DRAWN AEROBIC AND ANAEROBIC   Special Requests Blood Culture adequate volume  Final   Culture   Final    NO GROWTH 5 DAYS Performed at [facility], [address] [place], [place] [address]    Report Status 04/05/2023 FINAL  Final  Blood Culture (routine x 2)     Status: None   Collection  Time: 03/31/23 10:04 AM   Specimen: Left Antecubital; Blood  Result Value Ref Range Status   Specimen Description   Final    LEFT ANTECUBITAL BOTTLES DRAWN AEROBIC AND ANAEROBIC   Special Requests   Final    Blood Culture results may not be optimal due to an excessive volume of blood received in culture bottles   Culture   Final    NO GROWTH 5 DAYS Performed at [facility], [address], [place], [place] [address]    Report Status 04/05/2023 FINAL  Final  Resp panel by RT-PCR (RSV, Flu A&B, Covid) Anterior Nasal Swab     Status: None   Collection Time: 03/31/23 10:35 AM   Specimen: Anterior Nasal Swab  Result Value Ref Range Status   SARS Coronavirus 2 by RT PCR NEGATIVE NEGATIVE Final    Comment: (NOTE) SARS-CoV-2 target nucleic acids are NOT DETECTED.  The SARS-CoV-2 RNA is generally detectable in upper respiratory specimens during the acute phase of infection. The lowest concentration of SARS-CoV-2 viral copies this assay can detect is 138 copies/mL. A negative result does not preclude SARS-Cov-2 infection and should not be used as the sole basis for treatment or other patient management decisions. A negative result may occur with  improper specimen collection/handling, submission of specimen other than nasopharyngeal swab, presence of viral mutation(s) within the areas targeted by this assay, and inadequate number of viral copies(<138 copies/mL). A negative result must be combined with clinical observations, patient history, and epidemiological information. The expected result is Negative.  Fact Sheet for Patients:  BloggerCourse.com  Fact Sheet for Healthcare Providers:  SeriousBroker.it  This test is no t yet approved or cleared by the [place] FDA and  has been authorized for detection and/or diagnosis of SARS-CoV-2 by FDA under an Emergency Use Authorization (EUA). This EUA will remain  in effect (meaning this  test can be used) for the duration of the COVID-19 declaration under Section 564(b)(1) of the Act, 21 U.S.C.section 360bbb-3(b)(1), unless the authorization is terminated  or revoked sooner.       Influenza A by PCR NEGATIVE NEGATIVE Final   Influenza B by PCR NEGATIVE NEGATIVE Final    Comment: (NOTE) The Xpert Xpress SARS-CoV-2/FLU/RSV plus assay is intended as an aid in the diagnosis of influenza from Nasopharyngeal swab specimens and should not be used as a sole basis for treatment. Nasal washings and aspirates are unacceptable for Xpert Xpress SARS-CoV-2/FLU/RSV testing.  Fact Sheet for Patients: BloggerCourse.com  Fact Sheet for Healthcare Providers: SeriousBroker.it  This test is not yet approved or cleared by the [place] FDA and has been authorized for detection and/or diagnosis of SARS-CoV-2 by FDA under an Emergency Use Authorization (EUA). This EUA will remain in effect (meaning this test can be used) for the duration of the COVID-19 declaration under Section 564(b)(1) of the Act, 21 U.S.C. section 360bbb-3(b)(1), unless the authorization is terminated or revoked.     Resp Syncytial Virus by PCR NEGATIVE NEGATIVE Final    Comment: (NOTE) Fact Sheet for Patients: BloggerCourse.com  Fact Sheet for Healthcare Providers: SeriousBroker.it  This test is not yet approved or cleared by the [place] FDA and has been authorized for detection and/or diagnosis of SARS-CoV-2 by FDA under an Emergency Use Authorization (EUA). This EUA will remain in effect (meaning this test can be used) for the duration of the COVID-19 declaration under Section 564(b)(1) of the Act, 21 U.S.C. section 360bbb-3(b)(1), unless the authorization is terminated or revoked.  Performed at [facility], [address] [place], [place] [address]   MRSA Next Gen by PCR, Nasal     Status:  None   Collection Time: 03/31/23  4:22 PM   Specimen: Nasal Mucosa; Nasal Swab  Result Value Ref Range Status   MRSA by PCR Next Gen NOT DETECTED NOT DETECTED Final    Comment: (NOTE) The GeneXpert MRSA Assay (FDA approved for NASAL specimens only), is one component of a comprehensive MRSA colonization surveillance program. It is not intended to diagnose MRSA infection nor to guide or monitor treatment for MRSA infections. Test performance is not FDA approved in patients less than 90 years old. Performed at [facility], [address], [place], [place] [address]     Labs: CBC: Recent Labs  Lab 04/08/23 0115 04/09/23 0400 04/09/23 1601 04/10/23 0312 04/13/23 0959  WBC 6.8 7.1  --  6.3 6.3  HGB 7.3* 8.0* 8.2* 8.6* 7.8*  HCT 23.3* 24.9* 25.9* 26.6* 23.8*  MCV 106.4* 102.9*  --  103.1* 102.6*  PLT 195 190  --  177 178   Basic Metabolic Panel: Recent Labs  Lab 04/08/23 0115 04/09/23 0400 04/10/23 0312 04/11/23 0313 04/12/23 0037 04/13/23 0109 04/13/23 2017 04/14/23 0116  NA 131* 134* 133* 132* 131* 130* 131* 132*  K 3.8 3.5 3.6 3.7 3.4* 2.6* 3.3* 3.3*  CL 96* 97* 93* 97* 93* 90* 90* 90*  CO2 26 28 25 27 26 28 30 28   GLUCOSE 93 99 89 98 100* 89 123* 63*  BUN  16 17 22 21  24* 24* 25* 26*  CREATININE 1.06 1.16 1.00 1.00 1.16 1.11 1.16 1.15  CALCIUM 8.1* 8.4* 8.6* 8.5* 8.5* 8.3* 8.2* 8.4*  MG 2.2 2.0 1.9 2.3 2.1  --   --  2.0  PHOS 3.9  --   --   --   --   --   --   --    Liver Function Tests: Recent Labs  Lab 04/08/23 0115  ALBUMIN 2.9*   CBG: No results for input(s): "GLUCAP" in the last 168 hours.  Discharge time spent: greater than 30 minutes.  Signed: Coralie Keens, MD [facility] 04/14/2023

## 2023-04-15 ENCOUNTER — Telehealth: Payer: Self-pay | Admitting: Hematology

## 2023-04-15 ENCOUNTER — Other Ambulatory Visit: Payer: Self-pay

## 2023-04-15 DIAGNOSIS — D649 Anemia, unspecified: Secondary | ICD-10-CM

## 2023-04-15 DIAGNOSIS — K2971 Gastritis, unspecified, with bleeding: Secondary | ICD-10-CM

## 2023-04-15 NOTE — Progress Notes (Signed)
HEART & VASCULAR TRANSITION OF CARE NOTE    Referring Physician: Dr [place]  Primary Care: Dr Denny Levy Primary Cardiologist: Dr. Diona Browner   HPI: [name] is an 82 y.o. with a history of ETOH abuse, 5 hip replacements, PAD, CAD, CABG 2003, BMS to SVT to SVG to ramus 2017, HFpEF, aortic stenosis, and chronic a fib.   Admitted 09/24/22 with A/C HFpEF. Had cath with 3 vessel CAD patent LCX, LAD, PAD grafts. SVT to ramus occluded, elevated filling pressures, and CO 10 CI 4.7.  Diuresed with IV lasix and transitioned to torsemide 80 mg daily. Discharge weight 209 pounds.   Initial visit in [facility] 2/24, chronically NYHA III. On good GDMT, plan to follow up PRN.  Admitted 3/24 with a/c dHF and GIB. Diuresed with IV lasix, AC held. EGD showed non-bleeding ulcers and gastritis. Given 1 unit PRBCs and started on PPI.   Admitted 4/24 with GIB. Eliquis held. Underwent EGD showing mild Schatzki ring, gastritis and a non-bleeding duodenal ulcer. He required PRBCs and was transiently on NE, requiring ICU care. HsTroponin 216 > 220, cards consulted and felt 2/2 to acute blood loss. Cards recommended stopping Eliquis. He was discharged home.  Initial visit with Dr. Lalla Brothers 6/24, planning Watchman.  Admitted 6/24 with anemia an a/c HF, Hgb down to 6.7 and BNP 647. Diuresed with IV lasix. Received PRBCs and underwent EGD showing mild Schatzki ring, gastritis and a non-bleeding duodenal ulcer. He was discharged home. Outpatient colonoscopy 7/24 showed 2 non-bleeding internal hemorrhoids and 2 polyps removed.  Admitted 7/24 with anemia, hgb 6.2. He was transfused, and required NE for BP support. GI consulted, recommended Heme/Onc due to significant macrocytic anemia, may need VCE. Echo showed newly reduced EF 30-35%. Cards consulted, did not feel he was a good candidate for ischemic eval in setting of comorbid conditions and anemia. Mild to moderate AS not felt to be contributing to symptoms. GDMT titrated.  Underwent bone marrow bx showing with findings suggestive of MDS. He was started on ESA. He was discharged home, weight 192 lbs.  Today he returns to [facility] for post hospital follow up with his wife. Overall feeling fair. He walks around his house with crutches for short distances, has SOB with this. Continues to have LEE w/ weeping, wife is bandaging his legs. He has early satiety. Chronically sleeps in a recliner. Denies palpitations, CP, dizziness, or PND.  No fever or chills. Weight at home 190 pounds. Taking all medications. No tobacco or drug use. Drinks "a few beers and wine" daily.  Cardiac Testing   - Echo (7/24): EF 30-35%, mild concentric LVH, RV mildly reduced, mild MR and MS  - Cath (1/24): 3v CAD, mild to mod AS, AV mean gradient 18.5 mmHg, LVEDP 20 mmHg RA, 14, PA 56/17 (31), PCWP 24 with large V waves to 42, CO/CI (Fick) 10.28/4.66  - Echo (1/24): EF 55-60%, RV normal, mod to severe AS with AoV mean gradient 24 mmHg  Past Medical History:  Diagnosis Date   Aortic stenosis    Atrial flutter (HCC)    a. s/p remote ablation by Dr. Ladona Ridgel.   CAD (coronary artery disease)    a. s/p CABG 2003. b. 01/2016: unstable angina - s/p BMS to SVG-ramus intermediate, patent LIMA-mLAD, patent RIMA-dRCA.    CHF (congestive heart failure) (HCC)    Chronic lower back pain    Essential hypertension    Hyperlipidemia    Ischemic cardiomyopathy    a. Cath 01/2016 -  EF 50-55% with mild distal inferior hypocontractility.   Peripheral neuropathy 04/04/2014   Permanent atrial fibrillation (HCC)    Current Outpatient Medications  Medication Sig Dispense Refill   atorvastatin (LIPITOR) 80 MG tablet TAKE 1 TABLET BY MOUTH EVERY DAY (Patient taking differently: Take 80 mg by mouth daily.) 90 tablet 1   dapagliflozin propanediol (FARXIGA) 10 MG TABS tablet Take 10 mg by mouth daily.     Iron, Ferrous Sulfate, 325 (65 Fe) MG TABS Take 325 mg by mouth daily. 30 tablet 2   metoprolol succinate (TOPROL-XL)  25 MG 24 hr tablet Take 1 tablet (25 mg total) by mouth daily. 30 tablet 0   multivitamin (RENA-VIT) TABS tablet Take 1 tablet by mouth at bedtime. 30 tablet 0   nitroGLYCERIN (NITROSTAT) 0.4 MG SL tablet Place 1 tablet (0.4 mg total) under the tongue every 5 (five) minutes x 3 doses as needed for chest pain (if no relief after 3rd dose, proceed to the ED for an evaluation or call 911). 25 tablet 3   pantoprazole (PROTONIX) 40 MG tablet Take 1 tablet (40 mg total) by mouth 2 (two) times daily. 180 tablet 0   Potassium Chloride ER 20 MEQ TBCR Take 1 tablet (20 mEq total) by mouth daily. 1 tab daily by mouth 30 tablet 1   silver sulfADIAZINE (SILVADENE) 1 % cream Apply 1 Application topically daily. 50 g 0   torsemide (DEMADEX) 20 MG tablet Take 2 tablets (40 mg total) by mouth daily.     acetaminophen (TYLENOL) 325 MG tablet Take 2 tablets (650 mg total) by mouth every 6 (six) hours as needed for mild pain (or Fever >/= 101). 100 tablet 0   No current facility-administered medications for this encounter.   Allergies  Allergen Reactions   Augmentin [Amoxicillin-Pot Clavulanate] Nausea And Vomiting and Other (See Comments)    Has patient had a PCN reaction causing immediate rash, facial/tongue/throat swelling, SOB or lightheadedness with hypotension: Unknown Has patient had a PCN reaction causing severe rash involving mucus membranes or skin necrosis: No Has patient had a PCN reaction that required hospitalization: No Has patient had a PCN reaction occurring within the last 10 years: No If all of the above answers are "NO", then may proceed with Cephalosporin use.    Social History   Socioeconomic History   Marital status: Married    Spouse name: Leonard Cisneros   Number of children: 2   Years of education: Not on file   Highest education level: Bachelor's degree (e.g., BA, AB, BS)  Occupational History   Occupation: PT Holiday representative: RETIRED  Tobacco Use   Smoking status:  Former    Current packs/day: 0.00    Average packs/day: 0.5 packs/day for 2.0 years (1.0 ttl pk-yrs)    Types: Cigarettes    Start date: 09/03/1964    Quit date: 09/03/1966    Years since quitting: 56.6   Smokeless tobacco: Never  Vaping Use   Vaping status: Never Used  Substance and Sexual Activity   Alcohol use: Yes    Alcohol/week: 11.0 standard drinks of alcohol    Types: 7 Glasses of wine, 4 Cans of beer per week    Comment: a glass of wine with dinner/ beer on the weekend   Drug use: No   Sexual activity: Not on file  Other Topics Concern   Not on file  Social History Narrative   Lives with his wife.   Social Determinants of Health  Financial Resource Strain: Low Risk  (04/03/2023)   Overall Financial Resource Strain (CARDIA)    Difficulty of Paying Living Expenses: Not very hard  Food Insecurity: No Food Insecurity (04/03/2023)   Hunger Vital Sign    Worried About Running Out of Food in the Last Year: Never true    Ran Out of Food in the Last Year: Never true  Transportation Needs: No Transportation Needs (04/03/2023)   PRAPARE - Administrator, Civil Service (Medical): No    Lack of Transportation (Non-Medical): No  Physical Activity: Unknown (11/25/2022)   Exercise Vital Sign    Days of Exercise per Week: 0 days    Minutes of Exercise per Session: Not on file  Stress: No Stress Concern Present (01/04/2023)   [employer] of Occupational Health - Occupational Stress Questionnaire    Feeling of Stress : Not at all  Social Connections: Moderately Integrated (11/25/2022)   Social Connection and Isolation Panel [NHANES]    Frequency of Communication with Friends and Family: Once a week    Frequency of Social Gatherings with Friends and Family: Once a week    Attends Religious Services: More than 4 times per year    Active Member of [employer] or Organizations: Yes    Attends Engineer, structural: More than 4 times per year    Marital Status: Married   Catering manager Violence: Not At Risk (04/05/2023)   Humiliation, Afraid, Rape, and Kick questionnaire    Fear of Current or Ex-Partner: No    Emotionally Abused: No    Physically Abused: No    Sexually Abused: No   Family History  Problem Relation Age of Onset   Heart attack Brother    Colon cancer Neg Hx    BP 114/60   Pulse 91   Wt 86.3 kg (190 lb 3.2 oz)   SpO2 94%   BMI 26.53 kg/m   Wt Readings from Last 3 Encounters:  04/17/23 86.3 kg (190 lb 3.2 oz)  04/14/23 87.3 kg (192 lb 7.4 oz)  03/14/23 86.2 kg (190 lb)   PHYSICAL EXAM: General:  NAD. No resp difficulty, arrived in [facility], chronically-ill appearing. HEENT: Normal Neck: Supple. JVP to ear. Carotids 2+ bilat; no bruits. No lymphadenopathy or thryomegaly appreciated. Cor: PMI nondisplaced. Irregular rate & rhythm. No rubs, gallops or murmurs. Lungs: Clear, diminished in bases Abdomen: Soft, nontender, + distended. No hepatosplenomegaly. No bruits or masses. Good bowel sounds. Extremities: No cyanosis, clubbing, rash, 3+ BLE edema to thighs; RLE wrapped and weeping Neuro: Alert & oriented x 3, cranial nerves grossly intact. Moves all 4 extremities w/o difficulty. Affect pleasant.  ECG (personally reviewed): AF 92 bpm  ReDs: 42%  ASSESSMENT & PLAN: 1. Acute on Chronic Diastolic Heart Failure:  - Echo (2/24): EF 55-60% RV normal.  - Echo (7/24) with newly reduced EF 30-35%, mild concentric LVH, RV mildly reduced, mild MR and MS.   - Would not pursue LHC due to severe anemia. Could consider stress testing but will not change treatment plan. - With LVH, atrial fibrillation, low BP and valvular disease, could consider PYP scan to evaluate for cardiac amyloidosis, defer to General Cardiology. - NYHA IIIb, chronically. Volume up. ReDs 42%. GDMT limited by soft BP. - Increase torsemide to 40 mg bid, increase KCL to 40 daily. - Add metolazone 2.5 mg/extra 40 KCL daily x 2 days. - Continue Farxiga 10 mg daily. No GU  symptoms. - Continue Toprol XL 25 mg daily. - BP  too low for MRA or ARB/ARNi - He is not a candidate for advanced therapies.  - Consider unna boots for chronic venous stasis. - Labs today.  2. CAD:  - 3v CAD, patent LCX, LAD, PAD grafts. SVG to ramus occluded, - No chest pain.  - Off ASA w/ GIB. - Continue atorvastatin.  3. Aortic Stenosis:  - Mild to Moderate aortic stenosis. AV mean gradient 18.5 mm Hg. AVA 2.5 cm squared with index 1.15. - Followed every 6 months with Dr Diona Browner   4. Chronic A fib:  - Rate controlled with Toprol - No AC with GIB - He has seen Dr. Lalla Brothers, considering Watchman. ? Candidacy with deconditioning and ability to tolerate even short course of AC  5. 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 Prognosis, cytogenetics and molecular testing pending - On iron supp and Aranesp - Hgb goal > 8.5  7. Frailty/physical deconditioning - Functional decline, now with new diagnosis of MDS - EF recently reduced, need to consider GOC conversations. He is not a candidate for any advanced HF therapies. - Will try to aggressively diuresis him outpatient, he does not want to be re-admitted. Discussed with Dr. Gala Romney - He has follow up with Heme/Onc soon  Referred to HFSW (PCP, Medications, Transportation, ETOH Abuse, Drug Abuse, Insurance, Financial ):  No Refer to Pharmacy:  No Refer to Home Health: No Refer to Advanced Heart Failure Clinic:  No  Refer to General Cardiology: No, followed closely by Dr Diona Browner.   Follow up as needed.   Anderson Malta  FNP-BC  11:59 AM

## 2023-04-17 ENCOUNTER — Ambulatory Visit (HOSPITAL_COMMUNITY)
Admit: 2023-04-17 | Discharge: 2023-04-17 | Disposition: A | Discharge status: Home / Self Care | Payer: Medicare HMO | Source: Ambulatory Visit | Attending: Family Medicine | Admitting: Family Medicine

## 2023-04-17 ENCOUNTER — Encounter (HOSPITAL_COMMUNITY): Payer: Self-pay

## 2023-04-17 VITALS — BP 114/60 | HR 91 | Wt 190.2 lb

## 2023-04-17 DIAGNOSIS — I482 Chronic atrial fibrillation, unspecified: Secondary | ICD-10-CM | POA: Insufficient documentation

## 2023-04-17 DIAGNOSIS — D469 Myelodysplastic syndrome, unspecified: Secondary | ICD-10-CM | POA: Diagnosis not present

## 2023-04-17 DIAGNOSIS — R54 Age-related physical debility: Secondary | ICD-10-CM | POA: Diagnosis not present

## 2023-04-17 DIAGNOSIS — R5381 Other malaise: Secondary | ICD-10-CM | POA: Diagnosis not present

## 2023-04-17 DIAGNOSIS — I872 Venous insufficiency (chronic) (peripheral): Secondary | ICD-10-CM | POA: Diagnosis not present

## 2023-04-17 DIAGNOSIS — I2510 Atherosclerotic heart disease of native coronary artery without angina pectoris: Secondary | ICD-10-CM

## 2023-04-17 DIAGNOSIS — I350 Nonrheumatic aortic (valve) stenosis: Secondary | ICD-10-CM

## 2023-04-17 DIAGNOSIS — I4821 Permanent atrial fibrillation: Secondary | ICD-10-CM | POA: Diagnosis not present

## 2023-04-17 DIAGNOSIS — I5022 Chronic systolic (congestive) heart failure: Secondary | ICD-10-CM | POA: Diagnosis not present

## 2023-04-17 DIAGNOSIS — F1010 Alcohol abuse, uncomplicated: Secondary | ICD-10-CM

## 2023-04-17 DIAGNOSIS — I5033 Acute on chronic diastolic (congestive) heart failure: Secondary | ICD-10-CM | POA: Diagnosis not present

## 2023-04-17 LAB — BASIC METABOLIC PANEL
Anion gap: 11 (ref 5–15)
BUN: 29 mg/dL — ABNORMAL HIGH (ref 8–23)
CO2: 27 mmol/L (ref 22–32)
Calcium: 8.5 mg/dL — ABNORMAL LOW (ref 8.9–10.3)
Chloride: 99 mmol/L (ref 98–111)
Creatinine, Ser: 1.33 mg/dL — ABNORMAL HIGH (ref 0.61–1.24)
GFR, Estimated: 54 mL/min — ABNORMAL LOW (ref >60)
Glucose, Bld: 101 mg/dL — ABNORMAL HIGH (ref 70–99)
Potassium: 3.5 mmol/L (ref 3.5–5.1)
Sodium: 137 mmol/L (ref 135–145)

## 2023-04-17 LAB — BRAIN NATRIURETIC PEPTIDE: B Natriuretic Peptide: 852.5 pg/mL — ABNORMAL HIGH (ref 0.0–100.0)

## 2023-04-17 MED ORDER — METOLAZONE 2.5 MG PO TABS: 2.5000 mg | ORAL_TABLET | Freq: Every day | ORAL | 0 refills | Status: DC

## 2023-04-17 MED ORDER — POTASSIUM CHLORIDE ER 20 MEQ PO TBCR: 40.0000 meq | EXTENDED_RELEASE_TABLET | Freq: Every day | ORAL | 2 refills | Status: DC

## 2023-04-17 MED ORDER — TORSEMIDE 20 MG PO TABS: 40.0000 mg | ORAL_TABLET | Freq: Two times a day (BID) | ORAL | 2 refills | Status: DC

## 2023-04-17 NOTE — Patient Instructions (Addendum)
EKG done today.  RedsClip done today.   Labs done today. We will contact you only if your labs are abnormal.  INCREASE Torsemide to 40mg  (2 tablets) by mouth 2 times daily.  START Metolazone 2.5mg  (1 tablet) by mouth daily for 2 days. Take an extra 38meq(2 tablets) of Potassium with this  INCREASE Potassium to (2 tablets) by mouth daily.   No other medication changes were made. Please continue all current medications as prescribed.  Thank you for allowing [place] to provide your heart failure care after your recent hospitalization. Please follow-up with General Cardiology.

## 2023-04-17 NOTE — Progress Notes (Signed)
ReDS Vest / Clip - 04/17/23 1100       ReDS Vest / Clip   Station Marker C    Ruler Value 29    ReDS Value Range High volume overload    ReDS Actual Value 42

## 2023-04-19 DIAGNOSIS — Z556 Problems related to health literacy: Secondary | ICD-10-CM | POA: Diagnosis not present

## 2023-04-19 DIAGNOSIS — M6289 Other specified disorders of muscle: Secondary | ICD-10-CM | POA: Diagnosis not present

## 2023-04-19 DIAGNOSIS — I2510 Atherosclerotic heart disease of native coronary artery without angina pectoris: Secondary | ICD-10-CM | POA: Diagnosis not present

## 2023-04-19 DIAGNOSIS — D500 Iron deficiency anemia secondary to blood loss (chronic): Secondary | ICD-10-CM | POA: Diagnosis not present

## 2023-04-19 DIAGNOSIS — D539 Nutritional anemia, unspecified: Secondary | ICD-10-CM | POA: Diagnosis not present

## 2023-04-19 DIAGNOSIS — I872 Venous insufficiency (chronic) (peripheral): Secondary | ICD-10-CM | POA: Diagnosis not present

## 2023-04-19 DIAGNOSIS — R32 Unspecified urinary incontinence: Secondary | ICD-10-CM | POA: Diagnosis not present

## 2023-04-19 DIAGNOSIS — D469 Myelodysplastic syndrome, unspecified: Secondary | ICD-10-CM | POA: Diagnosis not present

## 2023-04-19 DIAGNOSIS — N189 Chronic kidney disease, unspecified: Secondary | ICD-10-CM | POA: Diagnosis not present

## 2023-04-19 DIAGNOSIS — G629 Polyneuropathy, unspecified: Secondary | ICD-10-CM | POA: Diagnosis not present

## 2023-04-19 DIAGNOSIS — I4821 Permanent atrial fibrillation: Secondary | ICD-10-CM | POA: Diagnosis not present

## 2023-04-19 DIAGNOSIS — I5032 Chronic diastolic (congestive) heart failure: Secondary | ICD-10-CM | POA: Diagnosis not present

## 2023-04-19 DIAGNOSIS — I350 Nonrheumatic aortic (valve) stenosis: Secondary | ICD-10-CM | POA: Diagnosis not present

## 2023-04-19 DIAGNOSIS — L97811 Non-pressure chronic ulcer of other part of right lower leg limited to breakdown of skin: Secondary | ICD-10-CM | POA: Diagnosis not present

## 2023-04-19 DIAGNOSIS — Z6824 Body mass index (BMI) 24.0-24.9, adult: Secondary | ICD-10-CM | POA: Diagnosis not present

## 2023-04-19 DIAGNOSIS — I255 Ischemic cardiomyopathy: Secondary | ICD-10-CM | POA: Diagnosis not present

## 2023-04-19 DIAGNOSIS — I4892 Unspecified atrial flutter: Secondary | ICD-10-CM | POA: Diagnosis not present

## 2023-04-19 DIAGNOSIS — I130 Hypertensive heart and chronic kidney disease with heart failure and stage 1 through stage 4 chronic kidney disease, or unspecified chronic kidney disease: Secondary | ICD-10-CM | POA: Diagnosis not present

## 2023-04-19 DIAGNOSIS — I272 Pulmonary hypertension, unspecified: Secondary | ICD-10-CM | POA: Diagnosis not present

## 2023-04-19 DIAGNOSIS — I739 Peripheral vascular disease, unspecified: Secondary | ICD-10-CM | POA: Diagnosis not present

## 2023-04-19 DIAGNOSIS — G8929 Other chronic pain: Secondary | ICD-10-CM | POA: Diagnosis not present

## 2023-04-19 DIAGNOSIS — M545 Low back pain, unspecified: Secondary | ICD-10-CM | POA: Diagnosis not present

## 2023-04-19 DIAGNOSIS — D6869 Other thrombophilia: Secondary | ICD-10-CM | POA: Diagnosis not present

## 2023-04-19 DIAGNOSIS — E785 Hyperlipidemia, unspecified: Secondary | ICD-10-CM | POA: Diagnosis not present

## 2023-04-19 DIAGNOSIS — E669 Obesity, unspecified: Secondary | ICD-10-CM | POA: Diagnosis not present

## 2023-04-21 NOTE — Progress Notes (Unsigned)
[facility]   [phone] Fax:(336) [phone]   Clinic New consult Note   Patient Care Team: [name], FNP as PCP - General (Family Medicine) [name], MD as PCP - Cardiology (Cardiology) Lanier Prude, MD as PCP - Electrophysiology (Cardiology) Jena Gauss Gerrit Friends, MD (Gastroenterology) Clinton Gallant, RN as [facility] Care Management Carlean Jews, NP as Nurse Practitioner (Hematology and Oncology) 04/22/2023  CHIEF COMPLAINTS/PURPOSE OF CONSULTATION:  Anemia and Myelodysplasia Syndrome  ASSESSMENT & PLAN:  Leonard Cisneros is a 82 y.o. male with a history of acute on chronic heart failure, atrial fibrillation, peripheral neuropathy, and lumbar disc disease. Presents today after being hospitalized for acute on chronic CHF and severe anemia. On presentation to the ED, his Hgb was 6.2. throughout his recent hospital stay he received 4 units of blood. Was started on Aranesp injections. Today, his Hgb has climbed to 8.8. The goal of therapy for him is to have Hgb between 9 and 11.   1.  Anemia Due to new diagnosis of Myelodysplastic Syndrome. Bone marrow biopsy during his hospital stay showed hypercellular bone marrow (50%) with mild dysphoretic changes,  including occasional hypolobated neutrophils,  abnormally lobated megakaryocytes (<10%) and ring sideroblasts (15%). He was transfused 4 units of blood during his stay and started on Aranesp.  No blood transfusion needed at visit 04/22/2023. Dose Aranesp increased to 200 mg. This will be given every 2 weeks. He will have labs checked prior to administration.    PLAN: 04/22/2023 - Labs reviewed  -CBC showing WBC 8.3; Hgb 8.8; Hct 27.0; Plt 240; Anc 6.4, ferritin 347 Dose of Aranesp increased to 200 mg and injection given during today's visit.  Recheck labs in 2 weeks with another injection given.  In 4 weeks, he should have lab check, follow up with Dr. Mosetta Putt, and injection.   He should continue to follow up with heart failure clinic. Currently undergoing aggressive diuresis with furosemide twice daily and oral potassium supplement.  He should follow up with his regular cardiologist and primary care provider as scheduled.  He and his wife were advised to contact providers at [facility] for any new and worsening symptoms. They voiced understanding and agreement.       HISTORY OF PRESENTING ILLNESS:  Leonard Cisneros 82 y.o. male is here because of macrocytic anemia.  He was found to have abnormal CBC from ED visit on 03/27/2023 He currently denies recent chest pain on exertion, shortness of breath on minimal exertion, pre-syncopal episodes, or palpitations. Just prior to his ED visit, he was having SOB with minimal exertion and moderate to severe fatigue.  He had not noticed any recent bleeding such as epistaxis, hematuria or hematochezia The patient denies over the counter NSAID ingestion. He is not on antiplatelets agents. His last colonoscopy was 03/13/2023 which showed non-bleeding internal hemorrhoids and two small, sessile polyps.  He had no prior history or diagnosis of cancer. His age appropriate screening programs are up-to-date. He denies any pica and eats a variety of diet. He received multiple blood transfusions during his recent hospitalization. The patient was prescribed oral iron supplements and he takes it daily. He was also started on Aranesp injections.    REVIEW OF SYSTEMS:   Constitutional: Denies fevers, chills or abnormal night sweats Eyes: Denies blurriness of vision, double vision or watery eyes Ears, nose, mouth, throat, and face: Denies mucositis or sore throat Respiratory: Denies cough, dyspnea or wheezes Cardiovascular: Denies palpitation or  chest discomfort. Continues to have bilateral lower extremity edema which has improved.  Gastrointestinal:  Denies nausea, heartburn or change in bowel habits Skin: Denies abnormal skin  rashes Lymphatics: Denies new lymphadenopathy or easy bruising Neurological:Denies numbness, tingling or new weaknesses Behavioral/Psych: Mood is stable, no new changes   All other systems were reviewed with the patient and are negative.   MEDICAL HISTORY:  Past Medical History:  Diagnosis Date   Aortic stenosis    Atrial flutter (HCC)    a. s/p remote ablation by Dr. Ladona Ridgel.   CAD (coronary artery disease)    a. s/p CABG 2003. b. 01/2016: unstable angina - s/p BMS to SVG-ramus intermediate, patent LIMA-mLAD, patent RIMA-dRCA.    CHF (congestive heart failure) (HCC)    Chronic lower back pain    Essential hypertension    Hyperlipidemia    Ischemic cardiomyopathy    a. Cath 01/2016 -  EF 50-55% with mild distal inferior hypocontractility.   Peripheral neuropathy 04/04/2014   Permanent atrial fibrillation (HCC)     SURGICAL HISTORY: Past Surgical History:  Procedure Laterality Date   BACK SURGERY     BIOPSY  11/21/2022   Procedure: BIOPSY;  Surgeon: Napoleon Form, MD;  Location: [facility] ENDOSCOPY;  Service: Gastroenterology;;   BIOPSY  02/15/2023   Procedure: BIOPSY;  Surgeon: Lanelle Bal, DO;  Location: [facility] ENDO SUITE;  Service: Endoscopy;;   CARDIAC CATHETERIZATION  ~ 2000; 2003   CARDIAC CATHETERIZATION N/A 02/01/2016   Procedure: Left Heart Cath and Cors/Grafts Angiography;  Surgeon: Lennette Bihari, MD;  Location: [facility] INVASIVE CV LAB;  Service: Cardiovascular;  Laterality: N/A;   CARDIAC CATHETERIZATION N/A 02/01/2016   Procedure: Coronary Stent Intervention;  Surgeon: Lennette Bihari, MD;  Location: [facility] INVASIVE CV LAB;  Service: Cardiovascular;  Laterality: N/A;   CATARACT EXTRACTION W/PHACO Left 02/09/2019   Procedure: CATARACT EXTRACTION PHACO AND INTRAOCULAR LENS PLACEMENT (IOC);  Surgeon: Fabio Pierce, MD;  Location: [facility] ORS;  Service: Ophthalmology;  Laterality: Left;  CDE: 27.70   CATARACT EXTRACTION W/PHACO Right 02/23/2019   Procedure: CATARACT EXTRACTION PHACO AND  INTRAOCULAR LENS PLACEMENT RIGHT EYE;  Surgeon: Fabio Pierce, MD;  Location: [facility] ORS;  Service: Ophthalmology;  Laterality: Right;  CDE: 10.15   COLONOSCOPY  06/04/2002   Normal colon and rectum   COLONOSCOPY  07/08/2012   Procedure: COLONOSCOPY;  Surgeon: Corbin Ade, MD;  Location: [facility] ENDO SUITE;  Service: Endoscopy;  Laterality: N/A;  11:30   COLONOSCOPY N/A 10/02/2017   Procedure: COLONOSCOPY;  Surgeon: Corbin Ade, MD;  Location: [facility] ENDO SUITE;  Service: Endoscopy;  Laterality: N/A;  10:30am   COLONOSCOPY WITH PROPOFOL N/A 03/13/2023   Procedure: COLONOSCOPY WITH PROPOFOL;  Surgeon: Lanelle Bal, DO;  Location: [facility] ENDO SUITE;  Service: Endoscopy;  Laterality: N/A;  1215pm, asa 3   CORONARY ANGIOPLASTY WITH STENT PLACEMENT  02/01/2016   CORONARY ARTERY BYPASS GRAFT  2003   LIMA to LAD,LIMA to distal RCA,SVG to ramus intermediate vessel.   ESOPHAGOGASTRODUODENOSCOPY (EGD) WITH PROPOFOL N/A 11/21/2022   Procedure: ESOPHAGOGASTRODUODENOSCOPY (EGD) WITH PROPOFOL;  Surgeon: Napoleon Form, MD;  Location: [facility] ENDOSCOPY;  Service: Gastroenterology;  Laterality: N/A;   ESOPHAGOGASTRODUODENOSCOPY (EGD) WITH PROPOFOL N/A 12/27/2022   Procedure: ESOPHAGOGASTRODUODENOSCOPY (EGD) WITH PROPOFOL;  Surgeon: Lanelle Bal, DO;  Location: [facility] END[facility] SUITE;  Service: Endoscopy;  Laterality: N/A;   ESOPHAGOGASTRODUODENOSCOPY (EGD) WITH PROPOFOL N/A 02/15/2023   Procedure: ESOPHAGOGASTRODUODENOSCOPY (EGD) WITH PROPOFOL;  Surgeon: Lanelle Bal, DO;  Location:  [facility] END[facility];  Service: Endoscopy;  Laterality: N/A;   FEMORAL REVISION Right 03/30/2019   Procedure: Femoral revision right total hip arthroplasty;  Surgeon: Ollen Gross, MD;  Location: [facility] ORS;  Service: Orthopedics;  Laterality: Right;    FOREIGN BODY REMOVAL Right 04/02/2014   Procedure: FOREIGN BODY REMOVAL RIGHT FOOT;  Surgeon: Dalia Heading, MD;  Location: [facility] ORS;  Service: General;  Laterality: Right;   JOINT REPLACEMENT      JOINT REPLACEMENT     LUMBAR DISC SURGERY     POLYPECTOMY  03/13/2023   Procedure: POLYPECTOMY;  Surgeon: Lanelle Bal, DO;  Location: [facility] END[facility];  Service: Endoscopy;;   REVISION TOTAL HIP ARTHROPLASTY Bilateral 2005-2012   right-left   RIGHT/LEFT HEART CATH AND CORONARY/GRAFT ANGIOGRAPHY N/A 09/27/2022   Procedure: RIGHT/LEFT HEART CATH AND CORONARY/GRAFT ANGIOGRAPHY;  Surgeon: Swazil[place], Peter M, MD;  Location: [facility] INVASIVE CV LAB;  Service: Cardiovascular;  Laterality: N/A;   SHOULDER OPEN ROTATOR CUFF REPAIR Right    TOTAL HIP ARTHROPLASTY Right 1999   TOTAL HIP ARTHROPLASTY Left 2008   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    SOCIAL HISTORY: Social History   Socioeconomic History   Marital status: Married    Spouse name: Larita Fife   Number of children: 2   Years of education: Not on file   Highest education level: Bachelor's degree (e.g., BA, AB, BS)  Occupational History   Occupation: PT Holiday representative: RETIRED  Tobacco Use   Smoking status: Former    Current packs/day: 0.00    Average packs/day: 0.5 packs/day for 2.0 years (1.0 ttl pk-yrs)    Types: Cigarettes    Start date: 09/03/1964    Quit date: 09/03/1966    Years since quitting: 56.6   Smokeless tobacco: Never  Vaping Use   Vaping status: Never Used  Substance and Sexual Activity   Alcohol use: Yes    Alcohol/week: 11.0 standard drinks of alcohol    Types: 7 Glasses of wine, 4 Cans of beer per week    Comment: a glass of wine with dinner/ beer on the weekend   Drug use: No   Sexual activity: Not on file  Other Topics Concern   Not on file  Social History Narrative   Lives with his wife.   Social Determinants of Health   Financial Resource Strain: Low Risk  (04/03/2023)   Overall Financial Resource  Strain (CARDIA)    Difficulty of Paying Living Expenses: Not very hard  Food Insecurity: No Food Insecurity (04/03/2023)   Hunger Vital Sign    Worried About Running Out of Food in the Last Year: Never true    Ran Out of Food in the Last Year: Never true  Transportation Needs: No Transportation Needs (04/03/2023)   PRAPARE - Administrator, Civil Service (Medical): No    Lack of Transportation (Non-Medical): No  Physical Activity: Unknown (11/25/2022)   Exercise Vital Sign    Days of Exercise per Week: 0 days    Minutes of Exercise per Session: Not on file  Stress: No Stress Concern Present (01/04/2023)   [employer] of Occupational Health - Occupational Stress Questionnaire    Feeling of Stress : Not at all  Social Connections: Moderately Integrated (11/25/2022)   Social Connection and Isolation Panel [  NHANES]    Frequency of Communication with Friends and Family: Once a week    Frequency of Social Gatherings with Friends and Family: Once a week    Attends Religious Services: More than 4 times per year    Active Member of [employer] or Organizations: Yes    Attends Engineer, structural: More than 4 times per year    Marital Status: Married  Catering manager Violence: Not At Risk (04/05/2023)   Humiliation, Afraid, Rape, and Kick questionnaire    Fear of Current or Ex-Partner: No    Emotionally Abused: No    Physically Abused: No    Sexually Abused: No    FAMILY HISTORY: Family History  Problem Relation Age of Onset   Heart attack Brother    Colon cancer Neg Hx     ALLERGIES:  is allergic to augmentin [amoxicillin-pot clavulanate].  MEDICATIONS:  Current Outpatient Medications  Medication Sig Dispense Refill   acetaminophen (TYLENOL) 325 MG tablet Take 2 tablets (650 mg total) by mouth every 6 (six) hours as needed for mild pain (or Fever >/= 101). 100 tablet 0   atorvastatin (LIPITOR) 80 MG tablet TAKE 1 TABLET BY MOUTH EVERY DAY (Patient taking  differently: Take 80 mg by mouth daily.) 90 tablet 1   dapagliflozin propanediol (FARXIGA) 10 MG TABS tablet Take 10 mg by mouth daily.     Iron, Ferrous Sulfate, 325 (65 Fe) MG TABS Take 325 mg by mouth daily. 30 tablet 2   metoprolol succinate (TOPROL-XL) 25 MG 24 hr tablet Take 1 tablet (25 mg total) by mouth daily. 30 tablet 0   multivitamin (RENA-VIT) TABS tablet Take 1 tablet by mouth at bedtime. 30 tablet 0   pantoprazole (PROTONIX) 40 MG tablet Take 1 tablet (40 mg total) by mouth 2 (two) times daily. 180 tablet 0   Potassium Chloride ER 20 MEQ TBCR Take 2 tablets (40 mEq total) by mouth daily. 1 tab daily by mouth 60 tablet 2   silver sulfADIAZINE (SILVADENE) 1 % cream Apply 1 Application topically daily. 50 g 0   torsemide (DEMADEX) 20 MG tablet Take 2 tablets (40 mg total) by mouth 2 (two) times daily. 120 tablet 2   metolazone (ZAROXOLYN) 2.5 MG tablet Take 1 tablet (2.5 mg total) by mouth daily for 2 days. 2 tablet 0   nitroGLYCERIN (NITROSTAT) 0.4 MG SL tablet Place 1 tablet (0.4 mg total) under the tongue every 5 (five) minutes x 3 doses as needed for chest pain (if no relief after 3rd dose, proceed to the ED for an evaluation or call 911). (Patient not taking: Reported on 04/22/2023) 25 tablet 3   No current facility-administered medications for this visit.    PHYSICAL EXAMINATION: ECOG PERFORMANCE STATUS: 2 - Symptomatic, <50% confined to bed  Vitals:   04/22/23 1041  BP: (!) 107/50  Pulse: (!) 103  Resp: 18  Temp: 97.9 F (36.6 C)  SpO2: 93%   Filed Weights   04/22/23 1041  Weight: 175 lb 8 oz (79.6 kg)    GENERAL:alert, no distress and comfortable SKIN: skin color, texture, turgor are normal. Right lower extremity has 1+ pitting edema with mild redness. This is wrapped in clean guaze. Left lower extremity has non pitting edema which is slightly red. Single spot which is covered by clean and dry guaze dressing.  EYES: normal, conjunctiva are pink and non-injected,  sclera clear OROPHARYNX:no exudate, no erythema and lips, buccal mucosa, and tongue normal  NECK: supple, thyroid normal  size, non-tender, without nodularity LYMPH:  no palpable lymphadenopathy in the cervical, axillary or inguinal LUNGS: clear to auscultation and percussion with normal breathing effort HEART: regular rate & rhythm and soft, blowing murmur. He has bilateral lower extremity edema. ABDOMEN:abdomen soft, non-tender and normal bowel sounds Musculoskeletal:no cyanosis of digits and no clubbing  PSYCH: alert & oriented x 3 with fluent speech NEURO: no focal motor/sensory deficits  LABORATORY DATA:  I have reviewed the data as listed    Latest Ref Rng & Units 04/22/2023   10:14 AM 04/13/2023    9:59 AM 04/10/2023    3:12 AM  CBC  WBC 4.0 - 10.5 K/uL 8.3  6.3  6.3   Hemoglobin 13.0 - 17.0 g/dL 8.8  7.8  8.6   Hematocrit 39.0 - 52.0 % 27.0  23.8  26.6   Platelets 150 - 400 K/uL 240  178  177        Latest Ref Rng & Units 04/17/2023   12:48 PM 04/14/2023    1:16 AM 04/13/2023    8:17 PM  CMP  Glucose 70 - 99 mg/dL 063  63  016   BUN 8 - 23 mg/dL 29  26  25    Creatinine 0.61 - 1.24 mg/dL 0.10  9.32  3.55   Sodium 135 - 145 mmol/L 137  132  131   Potassium 3.5 - 5.1 mmol/L 3.5  3.3  3.3   Chloride 98 - 111 mmol/L 99  90  90   CO2 22 - 32 mmol/L 27  28  30    Calcium 8.9 - 10.3 mg/dL 8.5  8.4  8.2      RADIOGRAPHIC STUDIES: I have personally reviewed the radiological images as listed and agreed with the findings in the report. CT BONE MARROW BIOPSY & ASPIRATION  Result Date: 04/04/2023 CLINICAL DATA:  Unexplained anemia and need for bone marrow biopsy. EXAM: CT GUIDED BONE MARROW ASPIRATION AND BIOPSY ANESTHESIA/SEDATION: Moderate (conscious) sedation was employed during this procedure. A total of Versed 1.0 mg and Fentanyl 75 mcg was administered intravenously by radiology nursing. Moderate Sedation Time: 13 minutes. The patient's level of consciousness and vital signs were  monitored continuously by radiology nursing throughout the procedure under my direct supervision. PROCEDURE: The procedure risks, benefits, and alternatives were explained to the patient. Questions regarding the procedure were encouraged and answered. The patient understands and consents to the procedure. A time out was performed prior to initiating the procedure. The right gluteal region was prepped with chlorhexidine. Sterile gown and sterile gloves were used for the procedure. Local anesthesia was provided with 1% Lidocaine. Under CT guidance, an 11 gauge On Control bone cutting needle was advanced from a posterior approach into the right iliac bone. Needle positioning was confirmed with CT. Initial non heparinized and heparinized aspirate samples were obtained of bone marrow. Core biopsy was performed via the On Control drill needle. COMPLICATIONS: None FINDINGS: Inspection of initial aspirate did reveal visible particles. Intact core biopsy sample was obtained. IMPRESSION: CT guided bone marrow biopsy of right posterior iliac bone with both aspirate and core samples obtained. Electronically Signed   By: Irish Lack M.D.   On: 04/04/2023 10:45   ECHOCARDIOGRAM COMPLETE  Result Date: 04/02/2023    ECHOCARDIOGRAM REPORT   Patient Name:   Leonard Cisneros Date of Exam: 04/02/2023 Medical Rec #:  [id number]        Height:       71.0 in Accession #:    [id number]  Weight:       198.6 lb Date of Birth:  1941/05/15       BSA:          2.102 m Patient Age:    81 years         BP:           110/74 mmHg Patient Gender: M                HR:           100 bpm. Exam Location:  Inpatient Procedure: 2D Echo, Cardiac Doppler and Color Doppler Indications:    CHF I50.21  History:        Patient has prior history of Echocardiogram examinations, most                 recent 09/25/2022. CAD, ETOH abuse, Arrythmias:Atrial                 Fibrillation; Risk Factors:Hypertension, Dyslipidemia and Former                  Smoker.  Sonographer:    Dondra Prader RVT RCS Referring Phys: [phone] WHITNEY D HARRIS IMPRESSIONS  1. Left ventricular ejection fraction, by estimation, is 30 to 35%. The left ventricle has moderately decreased function. The left ventricle demonstrates global hypokinesis. The left ventricular internal cavity size was mildly dilated. There is mild concentric left ventricular hypertrophy. Left ventricular diastolic parameters are indeterminate.  2. Right ventricular systolic function is mildly reduced. The right ventricular size is mildly enlarged. Tricuspid regurgitation signal is inadequate for assessing PA pressure.  3. Left atrial size was severely dilated.  4. Right atrial size was moderately dilated.  5. The mitral valve is normal in structure. Mild mitral valve regurgitation. Mild mitral stenosis. The mean mitral valve gradient is 6.0 mmHg. Moderate mitral annular calcification.  6. The aortic valve is tricuspid. There is moderate calcification of the aortic valve. Aortic valve regurgitation is not visualized. Mild aortic valve stenosis. Aortic valve mean gradient measures 14.0 mmHg.  7. The inferior vena cava is dilated in size with <50% respiratory variability, suggesting right atrial pressure of 15 mmHg. FINDINGS  Left Ventricle: Left ventricular ejection fraction, by estimation, is 30 to 35%. The left ventricle has moderately decreased function. The left ventricle demonstrates global hypokinesis. The left ventricular internal cavity size was mildly dilated. There is mild concentric left ventricular hypertrophy. Left ventricular diastolic parameters are indeterminate. Right Ventricle: The right ventricular size is mildly enlarged. No increase in right ventricular wall thickness. Right ventricular systolic function is mildly reduced. Tricuspid regurgitation signal is inadequate for assessing PA pressure. Left Atrium: Left atrial size was severely dilated. Right Atrium: Right atrial size was moderately dilated.  Pericardium: There is no evidence of pericardial effusion. Mitral Valve: The mitral valve is normal in structure. There is mild calcification of the mitral valve leaflet(s). Moderate mitral annular calcification. Mild mitral valve regurgitation. Mild mitral valve stenosis. MV peak gradient, 11.6 mmHg. The mean mitral valve gradient is 6.0 mmHg. Tricuspid Valve: The tricuspid valve is normal in structure. Tricuspid valve regurgitation is trivial. Aortic Valve: The aortic valve is tricuspid. There is moderate calcification of the aortic valve. Aortic valve regurgitation is not visualized. Mild aortic stenosis is present. Aortic valve mean gradient measures 14.0 mmHg. Aortic valve peak gradient measures 24.1 mmHg. Aortic valve area, by VTI measures 2.13 cm. Pulmonic Valve: The pulmonic valve was normal in structure. Pulmonic valve regurgitation is not visualized. Aorta:  The aortic root is normal in size and structure. Venous: The inferior vena cava is dilated in size with less than 50% respiratory variability, suggesting right atrial pressure of 15 mmHg. IAS/Shunts: No atrial level shunt detected by color flow Doppler.  LEFT VENTRICLE PLAX 2D LVIDd:         6.10 cm LVIDs:         5.20 cm LV PW:         1.40 cm LV IVS:        1.30 cm LVOT diam:     2.20 cm LV SV:         86 LV SV Index:   41 LVOT Area:     3.80 cm  RIGHT VENTRICLE            IVC RV Basal diam:  4.50 cm    IVC diam: 3.20 cm RV Mid diam:    3.00 cm RV S prime:     8.51 cm/s TAPSE (M-mode): 0.8 cm LEFT ATRIUM              Index        RIGHT ATRIUM           Index LA diam:        5.90 cm  2.81 cm/m   RA Area:     27.20 cm LA Vol (A2C):   169.0 ml 80.38 ml/m  RA Volume:   96.85 ml  46.07 ml/m LA Vol (A4C):   140.0 ml 66.59 ml/m LA Biplane Vol: 169.0 ml 80.38 ml/m  AORTIC VALVE                     PULMONIC VALVE AV Area (Vmax):    2.32 cm      PV Vmax:       0.84 m/s AV Area (Vmean):   2.31 cm      PV Peak grad:  2.9 mmHg AV Area (VTI):     2.13 cm  AV Vmax:           245.33 cm/s AV Vmean:          168.667 cm/s AV VTI:            0.401 m AV Peak Grad:      24.1 mmHg AV Mean Grad:      14.0 mmHg LVOT Vmax:         149.75 cm/s LVOT Vmean:        102.375 cm/s LVOT VTI:          0.225 m LVOT/AV VTI ratio: 0.56  AORTA Ao Root diam: 3.20 cm Ao Asc diam:  3.50 cm MITRAL VALVE MV Area (PHT): 4.94 cm     SHUNTS MV Area VTI:   3.09 cm     Systemic VTI:  0.22 m MV Peak grad:  11.6 mmHg    Systemic Diam: 2.20 cm MV Mean grad:  6.0 mmHg MV Vmax:       1.70 m/s MV Vmean:      110.0 cm/s MV Decel Time: 154 msec MR Peak grad: 88.4 mmHg MR Mean grad: 61.0 mmHg MR Vmax:      470.00 cm/s MR Vmean:     368.0 cm/s MV E velocity: 152.00 cm/s MV A velocity: 37.70 cm/s MV E/A ratio:  4.03 Dalton McleanMD Electronically signed by Wilfred Lacy Signature Date/Time: 04/02/2023/4:37:17 PM    Final    DG Chest Port 1 View  Result Date: 03/31/2023 CLINICAL  DATA:  Questionable sepsis.  Evaluate for abnormality. EXAM: PORTABLE CHEST 1 VIEW COMPARISON:  One-view chest 02/14/2023 FINDINGS: Heart is enlarged. Moderate pulmonary vascular congestion is present. Bilateral effusions are similar the prior exam. IMPRESSION: Cardiomegaly with moderate pulmonary vascular congestion and bilateral effusions compatible with congestive heart failure. Electronically Signed   By: Marin Roberts M.D.   On: 03/31/2023 10:32    No orders of the defined types were placed in this encounter.   All questions were answered. The patient knows to call the clinic with any problems, questions or concerns. The total time spent in the appointment was 40 minutes.     Carlean Jews, NP 04/22/2023 3:35 PM  Addendum I have seen the patient, examined him. I agree with the assessment and and plan and have edited the notes.   Leonard Cisneros came in today for follow-up and the second dose Aranesp injection.  I again reviewed his bone marrow biopsy results, cytogenetics and MDS FISH panel still pending.  lab reviewed, anemia overall stable, hemoglobin still less than 9, will increase Aranesp dose to 200 mcg every 2 weeks.  Goal hemoglobin is 9-11.  I again reviewed the benefit and side effect with pt and his wife, he voice good understanding.  I spent a total of 20 minutes for his visit today, more than 50% on face to face counseling.   Malachy Mood MD 04/22/2023

## 2023-04-22 ENCOUNTER — Telehealth: Payer: Self-pay | Admitting: Family Medicine

## 2023-04-22 ENCOUNTER — Inpatient Hospital Stay: Payer: Medicare HMO | Attending: Nurse Practitioner | Admitting: Nurse Practitioner

## 2023-04-22 ENCOUNTER — Encounter: Payer: Self-pay | Admitting: Nurse Practitioner

## 2023-04-22 ENCOUNTER — Inpatient Hospital Stay: Payer: Medicare HMO

## 2023-04-22 ENCOUNTER — Inpatient Hospital Stay (HOSPITAL_BASED_OUTPATIENT_CLINIC_OR_DEPARTMENT_OTHER): Payer: Medicare HMO | Admitting: Nurse Practitioner

## 2023-04-22 VITALS — BP 107/50 | HR 103 | Temp 97.9°F | Resp 18 | Wt 175.5 lb

## 2023-04-22 DIAGNOSIS — G8929 Other chronic pain: Secondary | ICD-10-CM | POA: Diagnosis not present

## 2023-04-22 DIAGNOSIS — D6869 Other thrombophilia: Secondary | ICD-10-CM | POA: Diagnosis not present

## 2023-04-22 DIAGNOSIS — I130 Hypertensive heart and chronic kidney disease with heart failure and stage 1 through stage 4 chronic kidney disease, or unspecified chronic kidney disease: Secondary | ICD-10-CM | POA: Diagnosis not present

## 2023-04-22 DIAGNOSIS — D469 Myelodysplastic syndrome, unspecified: Secondary | ICD-10-CM

## 2023-04-22 DIAGNOSIS — I5032 Chronic diastolic (congestive) heart failure: Secondary | ICD-10-CM | POA: Diagnosis not present

## 2023-04-22 DIAGNOSIS — K2971 Gastritis, unspecified, with bleeding: Secondary | ICD-10-CM

## 2023-04-22 DIAGNOSIS — M545 Low back pain, unspecified: Secondary | ICD-10-CM | POA: Diagnosis not present

## 2023-04-22 DIAGNOSIS — Z6824 Body mass index (BMI) 24.0-24.9, adult: Secondary | ICD-10-CM | POA: Diagnosis not present

## 2023-04-22 DIAGNOSIS — E669 Obesity, unspecified: Secondary | ICD-10-CM | POA: Diagnosis not present

## 2023-04-22 DIAGNOSIS — D461 Refractory anemia with ring sideroblasts: Secondary | ICD-10-CM | POA: Diagnosis not present

## 2023-04-22 DIAGNOSIS — Z556 Problems related to health literacy: Secondary | ICD-10-CM | POA: Diagnosis not present

## 2023-04-22 DIAGNOSIS — I2510 Atherosclerotic heart disease of native coronary artery without angina pectoris: Secondary | ICD-10-CM | POA: Diagnosis not present

## 2023-04-22 DIAGNOSIS — D539 Nutritional anemia, unspecified: Secondary | ICD-10-CM | POA: Diagnosis not present

## 2023-04-22 DIAGNOSIS — E785 Hyperlipidemia, unspecified: Secondary | ICD-10-CM | POA: Diagnosis not present

## 2023-04-22 DIAGNOSIS — I4821 Permanent atrial fibrillation: Secondary | ICD-10-CM | POA: Diagnosis not present

## 2023-04-22 DIAGNOSIS — R32 Unspecified urinary incontinence: Secondary | ICD-10-CM | POA: Diagnosis not present

## 2023-04-22 DIAGNOSIS — N189 Chronic kidney disease, unspecified: Secondary | ICD-10-CM | POA: Diagnosis not present

## 2023-04-22 DIAGNOSIS — I872 Venous insufficiency (chronic) (peripheral): Secondary | ICD-10-CM | POA: Diagnosis not present

## 2023-04-22 DIAGNOSIS — D649 Anemia, unspecified: Secondary | ICD-10-CM

## 2023-04-22 DIAGNOSIS — I350 Nonrheumatic aortic (valve) stenosis: Secondary | ICD-10-CM | POA: Diagnosis not present

## 2023-04-22 DIAGNOSIS — I4892 Unspecified atrial flutter: Secondary | ICD-10-CM | POA: Diagnosis not present

## 2023-04-22 DIAGNOSIS — L97811 Non-pressure chronic ulcer of other part of right lower leg limited to breakdown of skin: Secondary | ICD-10-CM | POA: Diagnosis not present

## 2023-04-22 DIAGNOSIS — D500 Iron deficiency anemia secondary to blood loss (chronic): Secondary | ICD-10-CM | POA: Diagnosis not present

## 2023-04-22 DIAGNOSIS — I272 Pulmonary hypertension, unspecified: Secondary | ICD-10-CM | POA: Diagnosis not present

## 2023-04-22 DIAGNOSIS — I739 Peripheral vascular disease, unspecified: Secondary | ICD-10-CM | POA: Diagnosis not present

## 2023-04-22 DIAGNOSIS — G629 Polyneuropathy, unspecified: Secondary | ICD-10-CM | POA: Diagnosis not present

## 2023-04-22 DIAGNOSIS — I255 Ischemic cardiomyopathy: Secondary | ICD-10-CM | POA: Diagnosis not present

## 2023-04-22 LAB — CBC WITH DIFFERENTIAL (CANCER CENTER ONLY)
Abs Immature Granulocytes: 0.04 10*3/uL (ref 0.00–0.07)
Basophils Absolute: 0 10*3/uL (ref 0.0–0.1)
Basophils Relative: 0 %
Eosinophils Absolute: 0.3 10*3/uL (ref 0.0–0.5)
Eosinophils Relative: 4 %
HCT: 27 % — ABNORMAL LOW (ref 39.0–52.0)
Hemoglobin: 8.8 g/dL — ABNORMAL LOW (ref 13.0–17.0)
Immature Granulocytes: 1 %
Lymphocytes Relative: 9 %
Lymphs Abs: 0.7 10*3/uL (ref 0.7–4.0)
MCH: 33.6 pg (ref 26.0–34.0)
MCHC: 32.6 g/dL (ref 30.0–36.0)
MCV: 103.1 fL — ABNORMAL HIGH (ref 80.0–100.0)
Monocytes Absolute: 0.8 10*3/uL (ref 0.1–1.0)
Monocytes Relative: 9 %
Neutro Abs: 6.4 10*3/uL (ref 1.7–7.7)
Neutrophils Relative %: 77 %
Platelet Count: 240 10*3/uL (ref 150–400)
RBC: 2.62 MIL/uL — ABNORMAL LOW (ref 4.22–5.81)
RDW: 23 % — ABNORMAL HIGH (ref 11.5–15.5)
WBC Count: 8.3 10*3/uL (ref 4.0–10.5)
nRBC: 0 % (ref 0.0–0.2)

## 2023-04-22 LAB — SAMPLE TO BLOOD BANK

## 2023-04-22 LAB — FERRITIN: Ferritin: 347 ng/mL — ABNORMAL HIGH (ref 24–336)

## 2023-04-22 MED ORDER — DARBEPOETIN ALFA 200 MCG/0.4ML IJ SOSY
200.0000 ug | PREFILLED_SYRINGE | Freq: Once | INTRAMUSCULAR | Status: AC
  Administered 2023-04-22: 200 ug via SUBCUTANEOUS
  Filled 2023-04-22: qty 0.4

## 2023-04-22 NOTE — Progress Notes (Signed)
Improved. No blood needed today. Dose aranesp increased to 200 mg. Patient will be scheduled for labs every 2 weeks with injection for Aranesp. Follow up appointment in 4 weeks.

## 2023-04-22 NOTE — Telephone Encounter (Signed)
Spoke to pam to approve orders.

## 2023-04-22 NOTE — Assessment & Plan Note (Addendum)
Patient presented to [facility] ED on 03/27/2023 with increasing lethargy and dyspnea at home. He was found to have hemoglobin of 6.2 along with hypotension with systolic pressure down into the 80s requiring norepinephrine.  Transferred from [facility] to [facility]. At the time of his transfer his blood pressure was 92/60, HR 78, RR 21 and 02 saturation 100%, lungs with no wheezing or rales, heart with S1 and S2 present and rhythmic, abdomen with no distention and positive lower extremity edema. Venous stasis ulcers to the level of the knee with open superficial ulcers. He was aggressively diuresed during his hospital stay and during his first few days at home.  04/22/2023 - Labs reviewed  -CBC showing WBC 8.3; Hgb 8.8; Hct 27.0; Plt 240; Anc 6.4, ferritin 347 Dose of Aranesp increased to 200 mg and injection given during today's visit.  Recheck labs in 2 weeks with another injection given.  In 4 weeks, he should have lab check, follow up with Dr. Mosetta Putt, and injection.  He should continue to follow up with heart failure clinic. Currently undergoing aggressive diuresis with furosemide twice daily and oral potassium supplement.  He should follow up with his regular cardiologist and primary care provider as scheduled.  He and his wife were advised to contact providers at [facility] for any new and worsening symptoms. They voiced understanding and agreement.

## 2023-04-22 NOTE — Telephone Encounter (Signed)
Pam , RN with [facility]  home health called in on patient behalf  Verbal orders for skilled nurse POC 1 week 4 2 month 1 2 PRN  Call back info Pam [phone]

## 2023-04-23 ENCOUNTER — Encounter: Payer: Self-pay | Admitting: Nurse Practitioner

## 2023-04-23 ENCOUNTER — Ambulatory Visit: Payer: Medicare HMO | Attending: Cardiology | Admitting: Cardiology

## 2023-04-23 ENCOUNTER — Encounter: Payer: Self-pay | Admitting: Cardiology

## 2023-04-23 ENCOUNTER — Telehealth: Payer: Self-pay | Admitting: Family Medicine

## 2023-04-23 VITALS — BP 104/58 | HR 98 | Ht 71.0 in | Wt 173.0 lb

## 2023-04-23 DIAGNOSIS — I5020 Unspecified systolic (congestive) heart failure: Secondary | ICD-10-CM

## 2023-04-23 DIAGNOSIS — I350 Nonrheumatic aortic (valve) stenosis: Secondary | ICD-10-CM

## 2023-04-23 DIAGNOSIS — I4819 Other persistent atrial fibrillation: Secondary | ICD-10-CM | POA: Diagnosis not present

## 2023-04-23 DIAGNOSIS — I25119 Atherosclerotic heart disease of native coronary artery with unspecified angina pectoris: Secondary | ICD-10-CM

## 2023-04-23 NOTE — Telephone Encounter (Signed)
Blanchie Serve called from [facility] need verbal order for physical therapy 2 week for 3 weeks / 1 week for 3 weeks and every other week for 2 weeks. Call back # [phone] and can leave a detailed message if need to.

## 2023-04-23 NOTE — Progress Notes (Signed)
Cardiology Office Note  Date: 04/23/2023   ID: Leonard Cisneros, DOB 1941/01/04, MRN [id number]  History of Present Illness: Leonard Cisneros is an 82 y.o. male presenting for a posthospital visit.  I reviewed the recent discharge summary.  He was admitted with severe symptomatic anemia with hemoglobin down to 6.2 requiring blood pressure support and PRBC transfusions.  He has not had an active defined bleeding source with recent EGD and colonoscopy showing mild gastritis and small duodenal ulcer with clean base as well as nonbleeding colonic polyps respectively.  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  Also had component of acute HFrEF, LVEF 30 to 35% and treated with diuresis with adjustments in GDMT.  He is here today with his wife for follow-up.  Does not report any angina, no orthostatic dizziness, NYHA class II dyspnea with low-level activity.  He remains weak in his legs, using crutches today but does have a walker at home.  He does not report any palpitations or syncope.  I reviewed his current medications, cardiac regimen includes Demadex 40 mg twice daily with potassium supplement, Toprol-XL 25 mg daily, Farxiga, Lipitor, and as needed nitroglycerin.  He is neither on antiplatelet therapy nor anticoagulants.  He states that his leg swelling/weeping has improved significantly although not entirely resolved.  Right lower leg is dressed today.  His weight is down about 15 pounds.  I reviewed his most recent lab work.  He states that he is weighing daily at home.  ECG today shows atrial fibrillation with rightward axis and nonspecific ST-T changes diffusely.  Physical Exam: VS:  BP (!) 104/58   Pulse 98   Ht 5\' 11"  (1.803 m)   Wt 173 lb (78.5 kg)   SpO2 94%   BMI 24.13 kg/m , BMI Body mass index is 24.13 kg/m.  Wt Readings from Last 3 Encounters:  04/23/23 173 lb (78.5 kg)  04/22/23 175 lb 8 oz (79.6 kg)  04/17/23 190 lb 3.2 oz (86.3  kg)    General: Patient appears comfortable at rest. HEENT: Conjunctiva and lids normal. Neck: Supple, no elevated JVP or carotid bruits. Lungs: Clear to auscultation, nonlabored breathing at rest. Cardiac: Irregularly irregular, 2/6 systolic murmur, no gallop. Abdomen: Soft, bowel sounds present. Extremities: Significantly improved lower leg edema, right worse than left, right lower leg dressed..  ECG:  An ECG dated 04/17/2023 was personally reviewed today and demonstrated:  Fibrillation with rightward axis and nonspecific ST-T changes.  Labwork: 02/12/2023: TSH 2.980 03/31/2023: ALT 41; AST 67 04/14/2023: Magnesium 2.0 04/17/2023: B Natriuretic Peptide 852.5; BUN 29; Creatinine, Ser 1.33; Potassium 3.5; Sodium 137 04/22/2023: Hemoglobin 8.8; Platelet Count 240     Component Value Date/Time   CHOL 110 11/12/2021 0042   CHOL 140 10/04/2021 0843   TRIG 23 11/12/2021 0042   HDL 74 11/12/2021 0042   HDL 93 10/04/2021 0843   CHOLHDL 1.5 11/12/2021 0042   VLDL 5 11/12/2021 0042   LDLCALC 31 11/12/2021 0042   LDLCALC 35 10/04/2021 0843   Other Studies Reviewed Today:  Echocardiogram 04/02/2023:  1. Left ventricular ejection fraction, by estimation, is 30 to 35%. The  left ventricle has moderately decreased function. The left ventricle  demonstrates global hypokinesis. The left ventricular internal cavity size  was mildly dilated. There is mild  concentric left ventricular hypertrophy. Left ventricular diastolic  parameters are indeterminate.   2. Right ventricular systolic function is mildly reduced. The right  ventricular size is  mildly enlarged. Tricuspid regurgitation signal is  inadequate for assessing PA pressure.   3. Left atrial size was severely dilated.   4. Right atrial size was moderately dilated.   5. The mitral valve is normal in structure. Mild mitral valve  regurgitation. Mild mitral stenosis. The mean mitral valve gradient is 6.0  mmHg. Moderate mitral annular  calcification.   6. The aortic valve is tricuspid. There is moderate calcification of the  aortic valve. Aortic valve regurgitation is not visualized. Mild aortic  valve stenosis. Aortic valve mean gradient measures 14.0 mmHg.   7. The inferior vena cava is dilated in size with <50% respiratory  variability, suggesting right atrial pressure of 15 mmHg.   Assessment and Plan:  1.  Permanent atrial fibrillation with CHA2DS2-VASc score of 5.  Continue Toprol-XL for heart rate control.  He is not able to be anticoagulated at this point as per prior discussion with recurring severe anemia, likely mixed picture.  He was evaluated for Watchman device implantation, but not felt to be a candidate even for short-term anticoagulation and therefore this will not be pursued.  We discussed this today.  2.  Multivessel CAD status post CABG in 2003 with LIMA to LAD, RIMA to RCA, and SVG to ramus intermedius.  Also status post BMS to SVG to ramus intermedius in 2017, but ultimately found to have occlusion of the SVG to ramus intermedius in January of this year managed medically.  He does not report any active angina at this time.  Not on aspirin at this point.  Continue Toprol-XL, Lipitor, and as needed nitroglycerin.  3.  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  Continue Toprol-XL, Farxiga, Demadex, and potassium supplement.  Low normal blood pressure limits further adjustments in GDMT at this time.  Will check BMET in 10 days and adjust diuretics from there.  Might potentially be able to reduce Demadex and add low-dose Aldactone.  Would hold off on ARB or Entresto at this point.   4.  Degenerative calcific aortic stenosis, mean AV gradient 14 mmHg and dimensionless index 0.56 by most recent echocardiogram consistent with mild stenosis overall.   5.  Essential hypertension.  Currently with low normal blood pressure.  Continue to track  this and reevaluate, low-dose midodrine would always be a consideration but he does not report any orthostasis at this point.   6.  Mixed hyperlipidemia.  Continue Lipitor.  Disposition:  Follow up  1 month.  Signed, [name], M.D., F.A.C.C. [facility] at [facility]

## 2023-04-23 NOTE — Patient Instructions (Signed)
Medication Instructions:   Your physician recommends that you continue on your current medications as directed. Please refer to the Current Medication list given to you today.   Labwork: BMET in 10 days (8/30)  Testing/Procedures: None today  Follow-Up: 1 month  Any Other Special Instructions Will Be Listed Below (If Applicable).  If you need a refill on your cardiac medications before your next appointment, please call your pharmacy.

## 2023-04-23 NOTE — Telephone Encounter (Signed)
Spoke with Stacie to give verbal orders.

## 2023-04-23 NOTE — Progress Notes (Signed)
ekg 

## 2023-04-24 ENCOUNTER — Telehealth: Payer: Self-pay | Admitting: Hematology

## 2023-04-24 DIAGNOSIS — D539 Nutritional anemia, unspecified: Secondary | ICD-10-CM | POA: Diagnosis not present

## 2023-04-24 DIAGNOSIS — N189 Chronic kidney disease, unspecified: Secondary | ICD-10-CM | POA: Diagnosis not present

## 2023-04-24 DIAGNOSIS — I130 Hypertensive heart and chronic kidney disease with heart failure and stage 1 through stage 4 chronic kidney disease, or unspecified chronic kidney disease: Secondary | ICD-10-CM | POA: Diagnosis not present

## 2023-04-24 DIAGNOSIS — G8929 Other chronic pain: Secondary | ICD-10-CM | POA: Diagnosis not present

## 2023-04-24 DIAGNOSIS — I4892 Unspecified atrial flutter: Secondary | ICD-10-CM | POA: Diagnosis not present

## 2023-04-24 DIAGNOSIS — D469 Myelodysplastic syndrome, unspecified: Secondary | ICD-10-CM | POA: Diagnosis not present

## 2023-04-24 DIAGNOSIS — I350 Nonrheumatic aortic (valve) stenosis: Secondary | ICD-10-CM | POA: Diagnosis not present

## 2023-04-24 DIAGNOSIS — I255 Ischemic cardiomyopathy: Secondary | ICD-10-CM | POA: Diagnosis not present

## 2023-04-24 DIAGNOSIS — L97811 Non-pressure chronic ulcer of other part of right lower leg limited to breakdown of skin: Secondary | ICD-10-CM | POA: Diagnosis not present

## 2023-04-24 DIAGNOSIS — I4821 Permanent atrial fibrillation: Secondary | ICD-10-CM | POA: Diagnosis not present

## 2023-04-24 DIAGNOSIS — D500 Iron deficiency anemia secondary to blood loss (chronic): Secondary | ICD-10-CM | POA: Diagnosis not present

## 2023-04-24 DIAGNOSIS — R32 Unspecified urinary incontinence: Secondary | ICD-10-CM | POA: Diagnosis not present

## 2023-04-24 DIAGNOSIS — I2510 Atherosclerotic heart disease of native coronary artery without angina pectoris: Secondary | ICD-10-CM | POA: Diagnosis not present

## 2023-04-24 DIAGNOSIS — Z6824 Body mass index (BMI) 24.0-24.9, adult: Secondary | ICD-10-CM | POA: Diagnosis not present

## 2023-04-24 DIAGNOSIS — I272 Pulmonary hypertension, unspecified: Secondary | ICD-10-CM | POA: Diagnosis not present

## 2023-04-24 DIAGNOSIS — Z556 Problems related to health literacy: Secondary | ICD-10-CM | POA: Diagnosis not present

## 2023-04-24 DIAGNOSIS — M545 Low back pain, unspecified: Secondary | ICD-10-CM | POA: Diagnosis not present

## 2023-04-24 DIAGNOSIS — E669 Obesity, unspecified: Secondary | ICD-10-CM | POA: Diagnosis not present

## 2023-04-24 DIAGNOSIS — E785 Hyperlipidemia, unspecified: Secondary | ICD-10-CM | POA: Diagnosis not present

## 2023-04-24 DIAGNOSIS — I739 Peripheral vascular disease, unspecified: Secondary | ICD-10-CM | POA: Diagnosis not present

## 2023-04-24 DIAGNOSIS — D6869 Other thrombophilia: Secondary | ICD-10-CM | POA: Diagnosis not present

## 2023-04-24 DIAGNOSIS — I872 Venous insufficiency (chronic) (peripheral): Secondary | ICD-10-CM | POA: Diagnosis not present

## 2023-04-24 DIAGNOSIS — G629 Polyneuropathy, unspecified: Secondary | ICD-10-CM | POA: Diagnosis not present

## 2023-04-24 DIAGNOSIS — I5032 Chronic diastolic (congestive) heart failure: Secondary | ICD-10-CM | POA: Diagnosis not present

## 2023-04-25 DIAGNOSIS — R32 Unspecified urinary incontinence: Secondary | ICD-10-CM | POA: Diagnosis not present

## 2023-04-25 DIAGNOSIS — D539 Nutritional anemia, unspecified: Secondary | ICD-10-CM | POA: Diagnosis not present

## 2023-04-25 DIAGNOSIS — I130 Hypertensive heart and chronic kidney disease with heart failure and stage 1 through stage 4 chronic kidney disease, or unspecified chronic kidney disease: Secondary | ICD-10-CM | POA: Diagnosis not present

## 2023-04-25 DIAGNOSIS — E669 Obesity, unspecified: Secondary | ICD-10-CM | POA: Diagnosis not present

## 2023-04-25 DIAGNOSIS — Z556 Problems related to health literacy: Secondary | ICD-10-CM | POA: Diagnosis not present

## 2023-04-25 DIAGNOSIS — I255 Ischemic cardiomyopathy: Secondary | ICD-10-CM | POA: Diagnosis not present

## 2023-04-25 DIAGNOSIS — G629 Polyneuropathy, unspecified: Secondary | ICD-10-CM | POA: Diagnosis not present

## 2023-04-25 DIAGNOSIS — D6869 Other thrombophilia: Secondary | ICD-10-CM | POA: Diagnosis not present

## 2023-04-25 DIAGNOSIS — D469 Myelodysplastic syndrome, unspecified: Secondary | ICD-10-CM | POA: Diagnosis not present

## 2023-04-25 DIAGNOSIS — L97811 Non-pressure chronic ulcer of other part of right lower leg limited to breakdown of skin: Secondary | ICD-10-CM | POA: Diagnosis not present

## 2023-04-25 DIAGNOSIS — I4821 Permanent atrial fibrillation: Secondary | ICD-10-CM | POA: Diagnosis not present

## 2023-04-25 DIAGNOSIS — I2510 Atherosclerotic heart disease of native coronary artery without angina pectoris: Secondary | ICD-10-CM | POA: Diagnosis not present

## 2023-04-25 DIAGNOSIS — G8929 Other chronic pain: Secondary | ICD-10-CM | POA: Diagnosis not present

## 2023-04-25 DIAGNOSIS — E785 Hyperlipidemia, unspecified: Secondary | ICD-10-CM | POA: Diagnosis not present

## 2023-04-25 DIAGNOSIS — I5032 Chronic diastolic (congestive) heart failure: Secondary | ICD-10-CM | POA: Diagnosis not present

## 2023-04-25 DIAGNOSIS — M545 Low back pain, unspecified: Secondary | ICD-10-CM | POA: Diagnosis not present

## 2023-04-25 DIAGNOSIS — I872 Venous insufficiency (chronic) (peripheral): Secondary | ICD-10-CM | POA: Diagnosis not present

## 2023-04-25 DIAGNOSIS — N189 Chronic kidney disease, unspecified: Secondary | ICD-10-CM | POA: Diagnosis not present

## 2023-04-25 DIAGNOSIS — I272 Pulmonary hypertension, unspecified: Secondary | ICD-10-CM | POA: Diagnosis not present

## 2023-04-25 DIAGNOSIS — I739 Peripheral vascular disease, unspecified: Secondary | ICD-10-CM | POA: Diagnosis not present

## 2023-04-25 DIAGNOSIS — Z6824 Body mass index (BMI) 24.0-24.9, adult: Secondary | ICD-10-CM | POA: Diagnosis not present

## 2023-04-25 DIAGNOSIS — D500 Iron deficiency anemia secondary to blood loss (chronic): Secondary | ICD-10-CM | POA: Diagnosis not present

## 2023-04-25 DIAGNOSIS — I350 Nonrheumatic aortic (valve) stenosis: Secondary | ICD-10-CM | POA: Diagnosis not present

## 2023-04-25 DIAGNOSIS — I4892 Unspecified atrial flutter: Secondary | ICD-10-CM | POA: Diagnosis not present

## 2023-04-26 ENCOUNTER — Telehealth: Payer: Self-pay | Admitting: Family Medicine

## 2023-04-26 NOTE — Telephone Encounter (Signed)
Wyatt Mage, Nurse w. [facility] called in on patient behalf   Verbal orders to change wound care orders   R leg is draining a lot   Orders for Silver Alginate  Una boots w compression for R leg   Change 2x weekly   Call back [phone]

## 2023-04-29 ENCOUNTER — Encounter: Payer: Self-pay | Admitting: Hematology

## 2023-04-29 DIAGNOSIS — D500 Iron deficiency anemia secondary to blood loss (chronic): Secondary | ICD-10-CM | POA: Diagnosis not present

## 2023-04-29 DIAGNOSIS — N189 Chronic kidney disease, unspecified: Secondary | ICD-10-CM | POA: Diagnosis not present

## 2023-04-29 DIAGNOSIS — G629 Polyneuropathy, unspecified: Secondary | ICD-10-CM | POA: Diagnosis not present

## 2023-04-29 DIAGNOSIS — Z556 Problems related to health literacy: Secondary | ICD-10-CM | POA: Diagnosis not present

## 2023-04-29 DIAGNOSIS — M545 Low back pain, unspecified: Secondary | ICD-10-CM | POA: Diagnosis not present

## 2023-04-29 DIAGNOSIS — I130 Hypertensive heart and chronic kidney disease with heart failure and stage 1 through stage 4 chronic kidney disease, or unspecified chronic kidney disease: Secondary | ICD-10-CM | POA: Diagnosis not present

## 2023-04-29 DIAGNOSIS — I5032 Chronic diastolic (congestive) heart failure: Secondary | ICD-10-CM | POA: Diagnosis not present

## 2023-04-29 DIAGNOSIS — I255 Ischemic cardiomyopathy: Secondary | ICD-10-CM | POA: Diagnosis not present

## 2023-04-29 DIAGNOSIS — D6869 Other thrombophilia: Secondary | ICD-10-CM | POA: Diagnosis not present

## 2023-04-29 DIAGNOSIS — I4821 Permanent atrial fibrillation: Secondary | ICD-10-CM | POA: Diagnosis not present

## 2023-04-29 DIAGNOSIS — I272 Pulmonary hypertension, unspecified: Secondary | ICD-10-CM | POA: Diagnosis not present

## 2023-04-29 DIAGNOSIS — D469 Myelodysplastic syndrome, unspecified: Secondary | ICD-10-CM | POA: Diagnosis not present

## 2023-04-29 DIAGNOSIS — I350 Nonrheumatic aortic (valve) stenosis: Secondary | ICD-10-CM | POA: Diagnosis not present

## 2023-04-29 DIAGNOSIS — R32 Unspecified urinary incontinence: Secondary | ICD-10-CM | POA: Diagnosis not present

## 2023-04-29 DIAGNOSIS — G8929 Other chronic pain: Secondary | ICD-10-CM | POA: Diagnosis not present

## 2023-04-29 DIAGNOSIS — I872 Venous insufficiency (chronic) (peripheral): Secondary | ICD-10-CM | POA: Diagnosis not present

## 2023-04-29 DIAGNOSIS — I739 Peripheral vascular disease, unspecified: Secondary | ICD-10-CM | POA: Diagnosis not present

## 2023-04-29 DIAGNOSIS — I4892 Unspecified atrial flutter: Secondary | ICD-10-CM | POA: Diagnosis not present

## 2023-04-29 DIAGNOSIS — D539 Nutritional anemia, unspecified: Secondary | ICD-10-CM | POA: Diagnosis not present

## 2023-04-29 DIAGNOSIS — Z6824 Body mass index (BMI) 24.0-24.9, adult: Secondary | ICD-10-CM | POA: Diagnosis not present

## 2023-04-29 DIAGNOSIS — L97811 Non-pressure chronic ulcer of other part of right lower leg limited to breakdown of skin: Secondary | ICD-10-CM | POA: Diagnosis not present

## 2023-04-29 DIAGNOSIS — I2510 Atherosclerotic heart disease of native coronary artery without angina pectoris: Secondary | ICD-10-CM | POA: Diagnosis not present

## 2023-04-29 DIAGNOSIS — E785 Hyperlipidemia, unspecified: Secondary | ICD-10-CM | POA: Diagnosis not present

## 2023-04-29 DIAGNOSIS — E669 Obesity, unspecified: Secondary | ICD-10-CM | POA: Diagnosis not present

## 2023-04-29 NOTE — Telephone Encounter (Signed)
Gave verbal orders. 

## 2023-04-30 ENCOUNTER — Telehealth: Payer: Self-pay | Admitting: Cardiology

## 2023-04-30 DIAGNOSIS — I130 Hypertensive heart and chronic kidney disease with heart failure and stage 1 through stage 4 chronic kidney disease, or unspecified chronic kidney disease: Secondary | ICD-10-CM | POA: Diagnosis not present

## 2023-04-30 DIAGNOSIS — R32 Unspecified urinary incontinence: Secondary | ICD-10-CM | POA: Diagnosis not present

## 2023-04-30 DIAGNOSIS — Z556 Problems related to health literacy: Secondary | ICD-10-CM | POA: Diagnosis not present

## 2023-04-30 DIAGNOSIS — I255 Ischemic cardiomyopathy: Secondary | ICD-10-CM | POA: Diagnosis not present

## 2023-04-30 DIAGNOSIS — I2510 Atherosclerotic heart disease of native coronary artery without angina pectoris: Secondary | ICD-10-CM | POA: Diagnosis not present

## 2023-04-30 DIAGNOSIS — D6869 Other thrombophilia: Secondary | ICD-10-CM | POA: Diagnosis not present

## 2023-04-30 DIAGNOSIS — D539 Nutritional anemia, unspecified: Secondary | ICD-10-CM | POA: Diagnosis not present

## 2023-04-30 DIAGNOSIS — I739 Peripheral vascular disease, unspecified: Secondary | ICD-10-CM | POA: Diagnosis not present

## 2023-04-30 DIAGNOSIS — Z6824 Body mass index (BMI) 24.0-24.9, adult: Secondary | ICD-10-CM | POA: Diagnosis not present

## 2023-04-30 DIAGNOSIS — E785 Hyperlipidemia, unspecified: Secondary | ICD-10-CM | POA: Diagnosis not present

## 2023-04-30 DIAGNOSIS — I272 Pulmonary hypertension, unspecified: Secondary | ICD-10-CM | POA: Diagnosis not present

## 2023-04-30 DIAGNOSIS — D500 Iron deficiency anemia secondary to blood loss (chronic): Secondary | ICD-10-CM | POA: Diagnosis not present

## 2023-04-30 DIAGNOSIS — I350 Nonrheumatic aortic (valve) stenosis: Secondary | ICD-10-CM | POA: Diagnosis not present

## 2023-04-30 DIAGNOSIS — E669 Obesity, unspecified: Secondary | ICD-10-CM | POA: Diagnosis not present

## 2023-04-30 DIAGNOSIS — G8929 Other chronic pain: Secondary | ICD-10-CM | POA: Diagnosis not present

## 2023-04-30 DIAGNOSIS — I872 Venous insufficiency (chronic) (peripheral): Secondary | ICD-10-CM | POA: Diagnosis not present

## 2023-04-30 DIAGNOSIS — N189 Chronic kidney disease, unspecified: Secondary | ICD-10-CM | POA: Diagnosis not present

## 2023-04-30 DIAGNOSIS — M545 Low back pain, unspecified: Secondary | ICD-10-CM | POA: Diagnosis not present

## 2023-04-30 DIAGNOSIS — I4892 Unspecified atrial flutter: Secondary | ICD-10-CM | POA: Diagnosis not present

## 2023-04-30 DIAGNOSIS — I4821 Permanent atrial fibrillation: Secondary | ICD-10-CM | POA: Diagnosis not present

## 2023-04-30 DIAGNOSIS — G629 Polyneuropathy, unspecified: Secondary | ICD-10-CM | POA: Diagnosis not present

## 2023-04-30 DIAGNOSIS — L97811 Non-pressure chronic ulcer of other part of right lower leg limited to breakdown of skin: Secondary | ICD-10-CM | POA: Diagnosis not present

## 2023-04-30 DIAGNOSIS — D469 Myelodysplastic syndrome, unspecified: Secondary | ICD-10-CM | POA: Diagnosis not present

## 2023-04-30 DIAGNOSIS — I5032 Chronic diastolic (congestive) heart failure: Secondary | ICD-10-CM | POA: Diagnosis not present

## 2023-04-30 NOTE — Telephone Encounter (Signed)
Returned call to pt. No answer. Voicemail not set up.

## 2023-04-30 NOTE — Telephone Encounter (Signed)
  STAT if HR is under 50 or over 120 (normal HR is 60-100 beats per minute)  What is your heart rate? Resting HR 116-125  Do you have a log of your heart rate readings (document readings)?   Do you have any other symptoms? Malori from [facility] called after visiting the patient today. She reported that his resting HR ranged between 116-125. They did a brief activity, during which the patient became short of breath but recovered immediately after 2 minutes of rest. She requested that to contact the patient for any recommendations.

## 2023-04-30 NOTE — Telephone Encounter (Signed)
Spoke with pt who states that " I'm fine now". Pt denies being SOB or having chest pain at this time. Pt states that he does not have a way to check his blood pressure at home. Please advise.

## 2023-05-01 ENCOUNTER — Encounter: Payer: Self-pay | Admitting: Cardiology

## 2023-05-01 DIAGNOSIS — E669 Obesity, unspecified: Secondary | ICD-10-CM | POA: Diagnosis not present

## 2023-05-01 DIAGNOSIS — G629 Polyneuropathy, unspecified: Secondary | ICD-10-CM | POA: Diagnosis not present

## 2023-05-01 DIAGNOSIS — I5032 Chronic diastolic (congestive) heart failure: Secondary | ICD-10-CM | POA: Diagnosis not present

## 2023-05-01 DIAGNOSIS — D6869 Other thrombophilia: Secondary | ICD-10-CM | POA: Diagnosis not present

## 2023-05-01 DIAGNOSIS — I739 Peripheral vascular disease, unspecified: Secondary | ICD-10-CM | POA: Diagnosis not present

## 2023-05-01 DIAGNOSIS — E785 Hyperlipidemia, unspecified: Secondary | ICD-10-CM | POA: Diagnosis not present

## 2023-05-01 DIAGNOSIS — L97811 Non-pressure chronic ulcer of other part of right lower leg limited to breakdown of skin: Secondary | ICD-10-CM | POA: Diagnosis not present

## 2023-05-01 DIAGNOSIS — I272 Pulmonary hypertension, unspecified: Secondary | ICD-10-CM | POA: Diagnosis not present

## 2023-05-01 DIAGNOSIS — I4821 Permanent atrial fibrillation: Secondary | ICD-10-CM | POA: Diagnosis not present

## 2023-05-01 DIAGNOSIS — M545 Low back pain, unspecified: Secondary | ICD-10-CM | POA: Diagnosis not present

## 2023-05-01 DIAGNOSIS — Z6824 Body mass index (BMI) 24.0-24.9, adult: Secondary | ICD-10-CM | POA: Diagnosis not present

## 2023-05-01 DIAGNOSIS — G8929 Other chronic pain: Secondary | ICD-10-CM | POA: Diagnosis not present

## 2023-05-01 DIAGNOSIS — D539 Nutritional anemia, unspecified: Secondary | ICD-10-CM | POA: Diagnosis not present

## 2023-05-01 DIAGNOSIS — I130 Hypertensive heart and chronic kidney disease with heart failure and stage 1 through stage 4 chronic kidney disease, or unspecified chronic kidney disease: Secondary | ICD-10-CM | POA: Diagnosis not present

## 2023-05-01 DIAGNOSIS — I255 Ischemic cardiomyopathy: Secondary | ICD-10-CM | POA: Diagnosis not present

## 2023-05-01 DIAGNOSIS — I2510 Atherosclerotic heart disease of native coronary artery without angina pectoris: Secondary | ICD-10-CM | POA: Diagnosis not present

## 2023-05-01 DIAGNOSIS — R32 Unspecified urinary incontinence: Secondary | ICD-10-CM | POA: Diagnosis not present

## 2023-05-01 DIAGNOSIS — D469 Myelodysplastic syndrome, unspecified: Secondary | ICD-10-CM | POA: Diagnosis not present

## 2023-05-01 DIAGNOSIS — N189 Chronic kidney disease, unspecified: Secondary | ICD-10-CM | POA: Diagnosis not present

## 2023-05-01 DIAGNOSIS — Z556 Problems related to health literacy: Secondary | ICD-10-CM | POA: Diagnosis not present

## 2023-05-01 DIAGNOSIS — I4892 Unspecified atrial flutter: Secondary | ICD-10-CM | POA: Diagnosis not present

## 2023-05-01 DIAGNOSIS — D500 Iron deficiency anemia secondary to blood loss (chronic): Secondary | ICD-10-CM | POA: Diagnosis not present

## 2023-05-01 DIAGNOSIS — I350 Nonrheumatic aortic (valve) stenosis: Secondary | ICD-10-CM | POA: Diagnosis not present

## 2023-05-01 DIAGNOSIS — I872 Venous insufficiency (chronic) (peripheral): Secondary | ICD-10-CM | POA: Diagnosis not present

## 2023-05-01 NOTE — Telephone Encounter (Signed)
Pt notified of Dr. Ival Bible response.

## 2023-05-03 ENCOUNTER — Other Ambulatory Visit (HOSPITAL_COMMUNITY)
Admission: RE | Admit: 2023-05-03 | Discharge: 2023-05-03 | Disposition: A | Discharge status: Home / Self Care | Payer: Medicare HMO | Source: Ambulatory Visit | Attending: Cardiology | Admitting: Cardiology

## 2023-05-03 DIAGNOSIS — I4819 Other persistent atrial fibrillation: Secondary | ICD-10-CM

## 2023-05-03 LAB — BASIC METABOLIC PANEL
Anion gap: 10 (ref 5–15)
BUN: 23 mg/dL (ref 8–23)
CO2: 26 mmol/L (ref 22–32)
Calcium: 8.1 mg/dL — ABNORMAL LOW (ref 8.9–10.3)
Chloride: 98 mmol/L (ref 98–111)
Creatinine, Ser: 1.13 mg/dL (ref 0.61–1.24)
GFR, Estimated: >60 mL/min (ref >60)
Glucose, Bld: 138 mg/dL — ABNORMAL HIGH (ref 70–99)
Potassium: 4.2 mmol/L (ref 3.5–5.1)
Sodium: 134 mmol/L — ABNORMAL LOW (ref 135–145)

## 2023-05-07 ENCOUNTER — Telehealth: Payer: Self-pay

## 2023-05-07 ENCOUNTER — Other Ambulatory Visit: Payer: Self-pay

## 2023-05-07 ENCOUNTER — Inpatient Hospital Stay: Payer: Medicare HMO

## 2023-05-07 ENCOUNTER — Inpatient Hospital Stay: Payer: Medicare HMO | Attending: Nurse Practitioner | Admitting: Nurse Practitioner

## 2023-05-07 VITALS — BP 103/58 | HR 92 | Temp 97.6°F | Resp 18

## 2023-05-07 DIAGNOSIS — L97811 Non-pressure chronic ulcer of other part of right lower leg limited to breakdown of skin: Secondary | ICD-10-CM | POA: Diagnosis not present

## 2023-05-07 DIAGNOSIS — G8929 Other chronic pain: Secondary | ICD-10-CM | POA: Insufficient documentation

## 2023-05-07 DIAGNOSIS — R32 Unspecified urinary incontinence: Secondary | ICD-10-CM | POA: Diagnosis not present

## 2023-05-07 DIAGNOSIS — I255 Ischemic cardiomyopathy: Secondary | ICD-10-CM | POA: Insufficient documentation

## 2023-05-07 DIAGNOSIS — Z79899 Other long term (current) drug therapy: Secondary | ICD-10-CM | POA: Diagnosis not present

## 2023-05-07 DIAGNOSIS — I509 Heart failure, unspecified: Secondary | ICD-10-CM | POA: Insufficient documentation

## 2023-05-07 DIAGNOSIS — I110 Hypertensive heart disease with heart failure: Secondary | ICD-10-CM | POA: Diagnosis not present

## 2023-05-07 DIAGNOSIS — K2971 Gastritis, unspecified, with bleeding: Secondary | ICD-10-CM

## 2023-05-07 DIAGNOSIS — D469 Myelodysplastic syndrome, unspecified: Secondary | ICD-10-CM

## 2023-05-07 DIAGNOSIS — D500 Iron deficiency anemia secondary to blood loss (chronic): Secondary | ICD-10-CM | POA: Diagnosis not present

## 2023-05-07 DIAGNOSIS — G629 Polyneuropathy, unspecified: Secondary | ICD-10-CM | POA: Insufficient documentation

## 2023-05-07 DIAGNOSIS — D649 Anemia, unspecified: Secondary | ICD-10-CM

## 2023-05-07 DIAGNOSIS — I739 Peripheral vascular disease, unspecified: Secondary | ICD-10-CM | POA: Diagnosis not present

## 2023-05-07 DIAGNOSIS — I272 Pulmonary hypertension, unspecified: Secondary | ICD-10-CM | POA: Diagnosis not present

## 2023-05-07 DIAGNOSIS — E669 Obesity, unspecified: Secondary | ICD-10-CM | POA: Diagnosis not present

## 2023-05-07 DIAGNOSIS — I350 Nonrheumatic aortic (valve) stenosis: Secondary | ICD-10-CM | POA: Diagnosis not present

## 2023-05-07 DIAGNOSIS — I4821 Permanent atrial fibrillation: Secondary | ICD-10-CM | POA: Insufficient documentation

## 2023-05-07 DIAGNOSIS — E785 Hyperlipidemia, unspecified: Secondary | ICD-10-CM | POA: Insufficient documentation

## 2023-05-07 DIAGNOSIS — I5032 Chronic diastolic (congestive) heart failure: Secondary | ICD-10-CM | POA: Diagnosis not present

## 2023-05-07 DIAGNOSIS — I130 Hypertensive heart and chronic kidney disease with heart failure and stage 1 through stage 4 chronic kidney disease, or unspecified chronic kidney disease: Secondary | ICD-10-CM | POA: Diagnosis not present

## 2023-05-07 DIAGNOSIS — I4892 Unspecified atrial flutter: Secondary | ICD-10-CM | POA: Diagnosis not present

## 2023-05-07 DIAGNOSIS — D539 Nutritional anemia, unspecified: Secondary | ICD-10-CM | POA: Diagnosis not present

## 2023-05-07 DIAGNOSIS — I872 Venous insufficiency (chronic) (peripheral): Secondary | ICD-10-CM | POA: Diagnosis not present

## 2023-05-07 DIAGNOSIS — I2510 Atherosclerotic heart disease of native coronary artery without angina pectoris: Secondary | ICD-10-CM | POA: Diagnosis not present

## 2023-05-07 DIAGNOSIS — Z556 Problems related to health literacy: Secondary | ICD-10-CM | POA: Diagnosis not present

## 2023-05-07 DIAGNOSIS — M545 Low back pain, unspecified: Secondary | ICD-10-CM | POA: Diagnosis not present

## 2023-05-07 DIAGNOSIS — D461 Refractory anemia with ring sideroblasts: Secondary | ICD-10-CM | POA: Diagnosis present

## 2023-05-07 DIAGNOSIS — Z6824 Body mass index (BMI) 24.0-24.9, adult: Secondary | ICD-10-CM | POA: Diagnosis not present

## 2023-05-07 DIAGNOSIS — D6869 Other thrombophilia: Secondary | ICD-10-CM | POA: Diagnosis not present

## 2023-05-07 DIAGNOSIS — N189 Chronic kidney disease, unspecified: Secondary | ICD-10-CM | POA: Diagnosis not present

## 2023-05-07 LAB — CBC WITH DIFFERENTIAL (CANCER CENTER ONLY)
Abs Immature Granulocytes: 0.12 10*3/uL — ABNORMAL HIGH (ref 0.00–0.07)
Basophils Absolute: 0 10*3/uL (ref 0.0–0.1)
Basophils Relative: 1 %
Eosinophils Absolute: 0.2 10*3/uL (ref 0.0–0.5)
Eosinophils Relative: 3 %
HCT: 21.9 % — ABNORMAL LOW (ref 39.0–52.0)
Hemoglobin: 6.9 g/dL — CL (ref 13.0–17.0)
Immature Granulocytes: 2 %
Lymphocytes Relative: 12 %
Lymphs Abs: 0.7 10*3/uL (ref 0.7–4.0)
MCH: 34 pg (ref 26.0–34.0)
MCHC: 31.5 g/dL (ref 30.0–36.0)
MCV: 107.9 fL — ABNORMAL HIGH (ref 80.0–100.0)
Monocytes Absolute: 0.6 10*3/uL (ref 0.1–1.0)
Monocytes Relative: 10 %
Neutro Abs: 4.3 10*3/uL (ref 1.7–7.7)
Neutrophils Relative %: 72 %
Platelet Count: 249 10*3/uL (ref 150–400)
RBC: 2.03 MIL/uL — ABNORMAL LOW (ref 4.22–5.81)
RDW: 22.6 % — ABNORMAL HIGH (ref 11.5–15.5)
WBC Count: 6 10*3/uL (ref 4.0–10.5)
nRBC: 0 % (ref 0.0–0.2)

## 2023-05-07 LAB — PREPARE RBC (CROSSMATCH)

## 2023-05-07 LAB — SAMPLE TO BLOOD BANK

## 2023-05-07 LAB — FERRITIN: Ferritin: 237 ng/mL (ref 24–336)

## 2023-05-07 MED ORDER — DARBEPOETIN ALFA 200 MCG/0.4ML IJ SOSY
200.0000 ug | PREFILLED_SYRINGE | Freq: Once | INTRAMUSCULAR | Status: AC
  Administered 2023-05-07: 200 ug via SUBCUTANEOUS
  Filled 2023-05-07: qty 0.4

## 2023-05-07 NOTE — Progress Notes (Signed)
Patient scheduled 05/08/2023 for blood transfusion

## 2023-05-07 NOTE — Progress Notes (Signed)
Verbal order given to administer 2 units of PRBC from D.r Mosetta Putt, spoke with Tresa Endo in blood bank type and screen order received, patient will have 2 units of PRBC transfused on 05/08/2023

## 2023-05-07 NOTE — Telephone Encounter (Signed)
CRITICAL VALUE STICKER  CRITICAL VALUE: Hgb. 6.9  RECEIVER (on-site recipient of call): Alwyn Cordner CMA  DATE & TIME NOTIFIED: 05/07/2023 0944  MESSENGER (representative from lab): Pam in Lab  MD NOTIFIED: Vincent Gros NP  TIME OF NOTIFICATION: [phone]  RESPONSE: Made physician

## 2023-05-08 ENCOUNTER — Other Ambulatory Visit: Payer: Self-pay

## 2023-05-08 ENCOUNTER — Inpatient Hospital Stay: Payer: Medicare HMO

## 2023-05-08 ENCOUNTER — Encounter: Payer: Self-pay | Admitting: Hematology

## 2023-05-08 DIAGNOSIS — D461 Refractory anemia with ring sideroblasts: Secondary | ICD-10-CM | POA: Diagnosis not present

## 2023-05-08 DIAGNOSIS — D649 Anemia, unspecified: Secondary | ICD-10-CM

## 2023-05-08 DIAGNOSIS — D469 Myelodysplastic syndrome, unspecified: Secondary | ICD-10-CM

## 2023-05-08 DIAGNOSIS — K2971 Gastritis, unspecified, with bleeding: Secondary | ICD-10-CM

## 2023-05-08 LAB — BPAM RBC
Blood Product Expiration Date: 202409302359
Blood Product Expiration Date: 202410012359
ISSUE DATE / TIME: 202409040942
ISSUE DATE / TIME: 202409040942
Unit Type and Rh: 5100
Unit Type and Rh: 5100

## 2023-05-08 LAB — TYPE AND SCREEN
ABO/RH(D): O POS
Antibody Screen: POSITIVE
Unit division: 0
Unit division: 0

## 2023-05-08 LAB — PREPARE RBC (CROSSMATCH)

## 2023-05-08 MED ORDER — SODIUM CHLORIDE 0.9% IV SOLUTION
250.0000 mL | Freq: Once | INTRAVENOUS | Status: AC
  Administered 2023-05-08: 250 mL via INTRAVENOUS

## 2023-05-09 ENCOUNTER — Other Ambulatory Visit: Payer: Self-pay | Admitting: Hematology

## 2023-05-09 ENCOUNTER — Other Ambulatory Visit: Payer: Self-pay | Admitting: *Deleted

## 2023-05-09 ENCOUNTER — Telehealth: Payer: Self-pay | Admitting: *Deleted

## 2023-05-09 ENCOUNTER — Inpatient Hospital Stay: Payer: Medicare HMO

## 2023-05-09 DIAGNOSIS — D649 Anemia, unspecified: Secondary | ICD-10-CM

## 2023-05-09 DIAGNOSIS — I872 Venous insufficiency (chronic) (peripheral): Secondary | ICD-10-CM

## 2023-05-09 DIAGNOSIS — D469 Myelodysplastic syndrome, unspecified: Secondary | ICD-10-CM

## 2023-05-09 DIAGNOSIS — Z79899 Other long term (current) drug therapy: Secondary | ICD-10-CM

## 2023-05-09 DIAGNOSIS — I5020 Unspecified systolic (congestive) heart failure: Secondary | ICD-10-CM

## 2023-05-09 DIAGNOSIS — D461 Refractory anemia with ring sideroblasts: Secondary | ICD-10-CM | POA: Diagnosis not present

## 2023-05-09 MED ORDER — SPIRONOLACTONE 25 MG PO TABS
12.5000 mg | ORAL_TABLET | Freq: Every day | ORAL | 6 refills | Status: DC
Start: 2023-05-09 — End: 2023-06-30

## 2023-05-09 MED ORDER — SODIUM CHLORIDE 0.9% IV SOLUTION
250.0000 mL | Freq: Once | INTRAVENOUS | Status: AC
  Administered 2023-05-09: 250 mL via INTRAVENOUS

## 2023-05-09 MED ORDER — DIPHENHYDRAMINE HCL 25 MG PO CAPS
25.0000 mg | ORAL_CAPSULE | Freq: Once | ORAL | Status: AC
  Administered 2023-05-09: 25 mg via ORAL
  Filled 2023-05-09: qty 1

## 2023-05-09 MED ORDER — POTASSIUM CHLORIDE ER 20 MEQ PO TBCR: 20.0000 meq | EXTENDED_RELEASE_TABLET | Freq: Every day | ORAL | 6 refills | Status: DC

## 2023-05-09 MED ORDER — ACETAMINOPHEN 325 MG PO TABS
650.0000 mg | ORAL_TABLET | Freq: Once | ORAL | Status: AC
  Administered 2023-05-09: 650 mg via ORAL
  Filled 2023-05-09: qty 2

## 2023-05-09 MED ORDER — TORSEMIDE 20 MG PO TABS: 40.0000 mg | ORAL_TABLET | Freq: Every day | ORAL | 6 refills | Status: DC

## 2023-05-09 NOTE — Patient Instructions (Signed)

## 2023-05-09 NOTE — Telephone Encounter (Signed)
-----   Message from [name] sent at 05/03/2023 12:01 PM EDT ----- Results reviewed.  Potassium 4.2 and creatinine improved to 1.13 with BUN 23.  If he has been stable in terms of continued improvement in leg edema, suggest cutting Demadex back to 40 mg once daily, KCl 20 mEq once daily, and start Aldactone 12.5 mg daily.  Recheck BMET in 10 days.

## 2023-05-09 NOTE — Telephone Encounter (Signed)
Patient informed and says leg edema has improved. Verbalized understanding of plan. Copy sent to PCP Lab order arranged for [employer]

## 2023-05-10 DIAGNOSIS — I4821 Permanent atrial fibrillation: Secondary | ICD-10-CM | POA: Diagnosis not present

## 2023-05-10 DIAGNOSIS — D500 Iron deficiency anemia secondary to blood loss (chronic): Secondary | ICD-10-CM | POA: Diagnosis not present

## 2023-05-10 DIAGNOSIS — E785 Hyperlipidemia, unspecified: Secondary | ICD-10-CM | POA: Diagnosis not present

## 2023-05-10 DIAGNOSIS — I5032 Chronic diastolic (congestive) heart failure: Secondary | ICD-10-CM | POA: Diagnosis not present

## 2023-05-10 DIAGNOSIS — D6869 Other thrombophilia: Secondary | ICD-10-CM | POA: Diagnosis not present

## 2023-05-10 DIAGNOSIS — I739 Peripheral vascular disease, unspecified: Secondary | ICD-10-CM | POA: Diagnosis not present

## 2023-05-10 DIAGNOSIS — I2510 Atherosclerotic heart disease of native coronary artery without angina pectoris: Secondary | ICD-10-CM | POA: Diagnosis not present

## 2023-05-10 DIAGNOSIS — I350 Nonrheumatic aortic (valve) stenosis: Secondary | ICD-10-CM | POA: Diagnosis not present

## 2023-05-10 DIAGNOSIS — M545 Low back pain, unspecified: Secondary | ICD-10-CM | POA: Diagnosis not present

## 2023-05-10 DIAGNOSIS — Z556 Problems related to health literacy: Secondary | ICD-10-CM | POA: Diagnosis not present

## 2023-05-10 DIAGNOSIS — I4892 Unspecified atrial flutter: Secondary | ICD-10-CM | POA: Diagnosis not present

## 2023-05-10 DIAGNOSIS — D469 Myelodysplastic syndrome, unspecified: Secondary | ICD-10-CM | POA: Diagnosis not present

## 2023-05-10 DIAGNOSIS — E669 Obesity, unspecified: Secondary | ICD-10-CM | POA: Diagnosis not present

## 2023-05-10 DIAGNOSIS — G629 Polyneuropathy, unspecified: Secondary | ICD-10-CM | POA: Diagnosis not present

## 2023-05-10 DIAGNOSIS — N189 Chronic kidney disease, unspecified: Secondary | ICD-10-CM | POA: Diagnosis not present

## 2023-05-10 DIAGNOSIS — G8929 Other chronic pain: Secondary | ICD-10-CM | POA: Diagnosis not present

## 2023-05-10 DIAGNOSIS — L97811 Non-pressure chronic ulcer of other part of right lower leg limited to breakdown of skin: Secondary | ICD-10-CM | POA: Diagnosis not present

## 2023-05-10 DIAGNOSIS — I272 Pulmonary hypertension, unspecified: Secondary | ICD-10-CM | POA: Diagnosis not present

## 2023-05-10 DIAGNOSIS — I130 Hypertensive heart and chronic kidney disease with heart failure and stage 1 through stage 4 chronic kidney disease, or unspecified chronic kidney disease: Secondary | ICD-10-CM | POA: Diagnosis not present

## 2023-05-10 DIAGNOSIS — R32 Unspecified urinary incontinence: Secondary | ICD-10-CM | POA: Diagnosis not present

## 2023-05-10 DIAGNOSIS — I872 Venous insufficiency (chronic) (peripheral): Secondary | ICD-10-CM | POA: Diagnosis not present

## 2023-05-10 DIAGNOSIS — Z6824 Body mass index (BMI) 24.0-24.9, adult: Secondary | ICD-10-CM | POA: Diagnosis not present

## 2023-05-10 DIAGNOSIS — I255 Ischemic cardiomyopathy: Secondary | ICD-10-CM | POA: Diagnosis not present

## 2023-05-10 DIAGNOSIS — D539 Nutritional anemia, unspecified: Secondary | ICD-10-CM | POA: Diagnosis not present

## 2023-05-10 LAB — TYPE AND SCREEN
ABO/RH(D): O POS
Antibody Screen: POSITIVE
Unit division: 0
Unit division: 0

## 2023-05-10 LAB — BPAM RBC
Blood Product Expiration Date: 202409302359
Blood Product Expiration Date: 202410012359
ISSUE DATE / TIME: 202409050725
ISSUE DATE / TIME: 202409050725
Unit Type and Rh: 5100
Unit Type and Rh: 5100

## 2023-05-13 DIAGNOSIS — G629 Polyneuropathy, unspecified: Secondary | ICD-10-CM | POA: Diagnosis not present

## 2023-05-13 DIAGNOSIS — I350 Nonrheumatic aortic (valve) stenosis: Secondary | ICD-10-CM | POA: Diagnosis not present

## 2023-05-13 DIAGNOSIS — I2510 Atherosclerotic heart disease of native coronary artery without angina pectoris: Secondary | ICD-10-CM | POA: Diagnosis not present

## 2023-05-13 DIAGNOSIS — D539 Nutritional anemia, unspecified: Secondary | ICD-10-CM | POA: Diagnosis not present

## 2023-05-13 DIAGNOSIS — I130 Hypertensive heart and chronic kidney disease with heart failure and stage 1 through stage 4 chronic kidney disease, or unspecified chronic kidney disease: Secondary | ICD-10-CM | POA: Diagnosis not present

## 2023-05-13 DIAGNOSIS — Z556 Problems related to health literacy: Secondary | ICD-10-CM | POA: Diagnosis not present

## 2023-05-13 DIAGNOSIS — D6869 Other thrombophilia: Secondary | ICD-10-CM | POA: Diagnosis not present

## 2023-05-13 DIAGNOSIS — Z6824 Body mass index (BMI) 24.0-24.9, adult: Secondary | ICD-10-CM | POA: Diagnosis not present

## 2023-05-13 DIAGNOSIS — I739 Peripheral vascular disease, unspecified: Secondary | ICD-10-CM | POA: Diagnosis not present

## 2023-05-13 DIAGNOSIS — I5032 Chronic diastolic (congestive) heart failure: Secondary | ICD-10-CM | POA: Diagnosis not present

## 2023-05-13 DIAGNOSIS — L97811 Non-pressure chronic ulcer of other part of right lower leg limited to breakdown of skin: Secondary | ICD-10-CM | POA: Diagnosis not present

## 2023-05-13 DIAGNOSIS — E669 Obesity, unspecified: Secondary | ICD-10-CM | POA: Diagnosis not present

## 2023-05-13 DIAGNOSIS — I4892 Unspecified atrial flutter: Secondary | ICD-10-CM | POA: Diagnosis not present

## 2023-05-13 DIAGNOSIS — I872 Venous insufficiency (chronic) (peripheral): Secondary | ICD-10-CM | POA: Diagnosis not present

## 2023-05-13 DIAGNOSIS — E785 Hyperlipidemia, unspecified: Secondary | ICD-10-CM | POA: Diagnosis not present

## 2023-05-13 DIAGNOSIS — R32 Unspecified urinary incontinence: Secondary | ICD-10-CM | POA: Diagnosis not present

## 2023-05-13 DIAGNOSIS — I255 Ischemic cardiomyopathy: Secondary | ICD-10-CM | POA: Diagnosis not present

## 2023-05-13 DIAGNOSIS — I4821 Permanent atrial fibrillation: Secondary | ICD-10-CM | POA: Diagnosis not present

## 2023-05-13 DIAGNOSIS — D469 Myelodysplastic syndrome, unspecified: Secondary | ICD-10-CM | POA: Diagnosis not present

## 2023-05-13 DIAGNOSIS — G8929 Other chronic pain: Secondary | ICD-10-CM | POA: Diagnosis not present

## 2023-05-13 DIAGNOSIS — M545 Low back pain, unspecified: Secondary | ICD-10-CM | POA: Diagnosis not present

## 2023-05-13 DIAGNOSIS — I272 Pulmonary hypertension, unspecified: Secondary | ICD-10-CM | POA: Diagnosis not present

## 2023-05-13 DIAGNOSIS — N189 Chronic kidney disease, unspecified: Secondary | ICD-10-CM | POA: Diagnosis not present

## 2023-05-13 DIAGNOSIS — D500 Iron deficiency anemia secondary to blood loss (chronic): Secondary | ICD-10-CM | POA: Diagnosis not present

## 2023-05-14 ENCOUNTER — Encounter: Payer: Self-pay | Admitting: Cardiology

## 2023-05-14 DIAGNOSIS — I272 Pulmonary hypertension, unspecified: Secondary | ICD-10-CM | POA: Diagnosis not present

## 2023-05-14 DIAGNOSIS — G8929 Other chronic pain: Secondary | ICD-10-CM | POA: Diagnosis not present

## 2023-05-14 DIAGNOSIS — D539 Nutritional anemia, unspecified: Secondary | ICD-10-CM | POA: Diagnosis not present

## 2023-05-14 DIAGNOSIS — Z6824 Body mass index (BMI) 24.0-24.9, adult: Secondary | ICD-10-CM | POA: Diagnosis not present

## 2023-05-14 DIAGNOSIS — I350 Nonrheumatic aortic (valve) stenosis: Secondary | ICD-10-CM | POA: Diagnosis not present

## 2023-05-14 DIAGNOSIS — R32 Unspecified urinary incontinence: Secondary | ICD-10-CM | POA: Diagnosis not present

## 2023-05-14 DIAGNOSIS — I739 Peripheral vascular disease, unspecified: Secondary | ICD-10-CM | POA: Diagnosis not present

## 2023-05-14 DIAGNOSIS — E785 Hyperlipidemia, unspecified: Secondary | ICD-10-CM | POA: Diagnosis not present

## 2023-05-14 DIAGNOSIS — I4892 Unspecified atrial flutter: Secondary | ICD-10-CM | POA: Diagnosis not present

## 2023-05-14 DIAGNOSIS — I5032 Chronic diastolic (congestive) heart failure: Secondary | ICD-10-CM | POA: Diagnosis not present

## 2023-05-14 DIAGNOSIS — E669 Obesity, unspecified: Secondary | ICD-10-CM | POA: Diagnosis not present

## 2023-05-14 DIAGNOSIS — I255 Ischemic cardiomyopathy: Secondary | ICD-10-CM | POA: Diagnosis not present

## 2023-05-14 DIAGNOSIS — Z556 Problems related to health literacy: Secondary | ICD-10-CM | POA: Diagnosis not present

## 2023-05-14 DIAGNOSIS — D469 Myelodysplastic syndrome, unspecified: Secondary | ICD-10-CM | POA: Diagnosis not present

## 2023-05-14 DIAGNOSIS — D500 Iron deficiency anemia secondary to blood loss (chronic): Secondary | ICD-10-CM | POA: Diagnosis not present

## 2023-05-14 DIAGNOSIS — I872 Venous insufficiency (chronic) (peripheral): Secondary | ICD-10-CM | POA: Diagnosis not present

## 2023-05-14 DIAGNOSIS — G629 Polyneuropathy, unspecified: Secondary | ICD-10-CM | POA: Diagnosis not present

## 2023-05-14 DIAGNOSIS — L97811 Non-pressure chronic ulcer of other part of right lower leg limited to breakdown of skin: Secondary | ICD-10-CM | POA: Diagnosis not present

## 2023-05-14 DIAGNOSIS — M545 Low back pain, unspecified: Secondary | ICD-10-CM | POA: Diagnosis not present

## 2023-05-14 DIAGNOSIS — I130 Hypertensive heart and chronic kidney disease with heart failure and stage 1 through stage 4 chronic kidney disease, or unspecified chronic kidney disease: Secondary | ICD-10-CM | POA: Diagnosis not present

## 2023-05-14 DIAGNOSIS — N189 Chronic kidney disease, unspecified: Secondary | ICD-10-CM | POA: Diagnosis not present

## 2023-05-14 DIAGNOSIS — I2510 Atherosclerotic heart disease of native coronary artery without angina pectoris: Secondary | ICD-10-CM | POA: Diagnosis not present

## 2023-05-14 DIAGNOSIS — D6869 Other thrombophilia: Secondary | ICD-10-CM | POA: Diagnosis not present

## 2023-05-14 DIAGNOSIS — I4821 Permanent atrial fibrillation: Secondary | ICD-10-CM | POA: Diagnosis not present

## 2023-05-15 DIAGNOSIS — I5020 Unspecified systolic (congestive) heart failure: Secondary | ICD-10-CM | POA: Diagnosis not present

## 2023-05-15 DIAGNOSIS — Z79899 Other long term (current) drug therapy: Secondary | ICD-10-CM | POA: Diagnosis not present

## 2023-05-15 DIAGNOSIS — I872 Venous insufficiency (chronic) (peripheral): Secondary | ICD-10-CM | POA: Diagnosis not present

## 2023-05-16 DIAGNOSIS — M545 Low back pain, unspecified: Secondary | ICD-10-CM | POA: Diagnosis not present

## 2023-05-16 DIAGNOSIS — R32 Unspecified urinary incontinence: Secondary | ICD-10-CM | POA: Diagnosis not present

## 2023-05-16 DIAGNOSIS — Z556 Problems related to health literacy: Secondary | ICD-10-CM | POA: Diagnosis not present

## 2023-05-16 DIAGNOSIS — D469 Myelodysplastic syndrome, unspecified: Secondary | ICD-10-CM | POA: Diagnosis not present

## 2023-05-16 DIAGNOSIS — I130 Hypertensive heart and chronic kidney disease with heart failure and stage 1 through stage 4 chronic kidney disease, or unspecified chronic kidney disease: Secondary | ICD-10-CM | POA: Diagnosis not present

## 2023-05-16 DIAGNOSIS — G629 Polyneuropathy, unspecified: Secondary | ICD-10-CM | POA: Diagnosis not present

## 2023-05-16 DIAGNOSIS — Z6824 Body mass index (BMI) 24.0-24.9, adult: Secondary | ICD-10-CM | POA: Diagnosis not present

## 2023-05-16 DIAGNOSIS — I5032 Chronic diastolic (congestive) heart failure: Secondary | ICD-10-CM | POA: Diagnosis not present

## 2023-05-16 DIAGNOSIS — I350 Nonrheumatic aortic (valve) stenosis: Secondary | ICD-10-CM | POA: Diagnosis not present

## 2023-05-16 DIAGNOSIS — D539 Nutritional anemia, unspecified: Secondary | ICD-10-CM | POA: Diagnosis not present

## 2023-05-16 DIAGNOSIS — I255 Ischemic cardiomyopathy: Secondary | ICD-10-CM | POA: Diagnosis not present

## 2023-05-16 DIAGNOSIS — G8929 Other chronic pain: Secondary | ICD-10-CM | POA: Diagnosis not present

## 2023-05-16 DIAGNOSIS — I4892 Unspecified atrial flutter: Secondary | ICD-10-CM | POA: Diagnosis not present

## 2023-05-16 DIAGNOSIS — D500 Iron deficiency anemia secondary to blood loss (chronic): Secondary | ICD-10-CM | POA: Diagnosis not present

## 2023-05-16 DIAGNOSIS — L97811 Non-pressure chronic ulcer of other part of right lower leg limited to breakdown of skin: Secondary | ICD-10-CM | POA: Diagnosis not present

## 2023-05-16 DIAGNOSIS — I272 Pulmonary hypertension, unspecified: Secondary | ICD-10-CM | POA: Diagnosis not present

## 2023-05-16 DIAGNOSIS — I739 Peripheral vascular disease, unspecified: Secondary | ICD-10-CM | POA: Diagnosis not present

## 2023-05-16 DIAGNOSIS — E785 Hyperlipidemia, unspecified: Secondary | ICD-10-CM | POA: Diagnosis not present

## 2023-05-16 DIAGNOSIS — N189 Chronic kidney disease, unspecified: Secondary | ICD-10-CM | POA: Diagnosis not present

## 2023-05-16 DIAGNOSIS — D6869 Other thrombophilia: Secondary | ICD-10-CM | POA: Diagnosis not present

## 2023-05-16 DIAGNOSIS — I4821 Permanent atrial fibrillation: Secondary | ICD-10-CM | POA: Diagnosis not present

## 2023-05-16 DIAGNOSIS — E669 Obesity, unspecified: Secondary | ICD-10-CM | POA: Diagnosis not present

## 2023-05-16 DIAGNOSIS — I2510 Atherosclerotic heart disease of native coronary artery without angina pectoris: Secondary | ICD-10-CM | POA: Diagnosis not present

## 2023-05-16 DIAGNOSIS — I872 Venous insufficiency (chronic) (peripheral): Secondary | ICD-10-CM | POA: Diagnosis not present

## 2023-05-16 LAB — BASIC METABOLIC PANEL
BUN/Creatinine Ratio: 16 (ref 10–24)
BUN: 17 mg/dL (ref 8–27)
CO2: 25 mmol/L (ref 20–29)
Calcium: 8.2 mg/dL — ABNORMAL LOW (ref 8.6–10.2)
Chloride: 97 mmol/L (ref 96–106)
Creatinine, Ser: 1.06 mg/dL (ref 0.76–1.27)
Glucose: 111 mg/dL — ABNORMAL HIGH (ref 70–99)
Potassium: 3.6 mmol/L (ref 3.5–5.2)
Sodium: 137 mmol/L (ref 134–144)
eGFR: 71 mL/min/{1.73_m2} (ref >59)

## 2023-05-19 NOTE — Progress Notes (Unsigned)
Patient Care Team: [name], FNP as PCP - General (Family Medicine) [name], MD as PCP - Cardiology (Cardiology) Lanier Prude, MD as PCP - Electrophysiology (Cardiology) Jena Gauss Gerrit Friends, MD (Gastroenterology) Clinton Gallant, RN as [facility] Care Management Hermenia Fiscal, [name], NP as Nurse Practitioner (Hematology and Oncology)   CHIEF COMPLAINT: Follow up anemia/MDS  CURRENT THERAPY:  Blood transfusion as needed, Aranesp 200 mg q2 weeks; goal Hgb 9-11  INTERVAL HISTORY Mr. Leonard Cisneros returns for follow up as scheduled. Last seen by Herbert Seta NP and Dr. Mosetta Putt 04/22/23 and started Aranesp. Hgb on 9/3 down to 6.8; he was transfused and continued Aranesp.   ROS   Past Medical History:  Diagnosis Date   Aortic stenosis    Atrial flutter (HCC)    a. s/p remote ablation by Dr. Ladona Ridgel.   CAD (coronary artery disease)    a. s/p CABG 2003. b. 01/2016: unstable angina - s/p BMS to SVG-ramus intermediate, patent LIMA-mLAD, patent RIMA-dRCA.    CHF (congestive heart failure) (HCC)    Chronic lower back pain    Essential hypertension    Hyperlipidemia    Ischemic cardiomyopathy    a. Cath 01/2016 -  EF 50-55% with mild distal inferior hypocontractility.   Peripheral neuropathy 04/04/2014   Permanent atrial fibrillation [facility])      Past Surgical History:  Procedure Laterality Date   BACK SURGERY     BIOPSY  11/21/2022   Procedure: BIOPSY;  Surgeon: Napoleon Form, MD;  Location: [facility] ENDOSCOPY;  Service: Gastroenterology;;   BIOPSY  02/15/2023   Procedure: BIOPSY;  Surgeon: Lanelle Bal, DO;  Location: [facility] ENDO SUITE;  Service: Endoscopy;;   CARDIAC CATHETERIZATION  ~ 2000; 2003   CARDIAC CATHETERIZATION N/A 02/01/2016   Procedure: Left Heart Cath and Cors/Grafts Angiography;  Surgeon: Lennette Bihari, MD;  Location: [facility] INVASIVE CV LAB;  Service: Cardiovascular;  Laterality: N/A;   CARDIAC CATHETERIZATION N/A 02/01/2016   Procedure: Coronary Stent  Intervention;  Surgeon: Lennette Bihari, MD;  Location: [facility] INVASIVE CV LAB;  Service: Cardiovascular;  Laterality: N/A;   CATARACT EXTRACTION W/PHACO Left 02/09/2019   Procedure: CATARACT EXTRACTION PHACO AND INTRAOCULAR LENS PLACEMENT (IOC);  Surgeon: Fabio Pierce, MD;  Location: [facility] ORS;  Service: Ophthalmology;  Laterality: Left;  CDE: 27.70   CATARACT EXTRACTION W/PHACO Right 02/23/2019   Procedure: CATARACT EXTRACTION PHACO AND INTRAOCULAR LENS PLACEMENT RIGHT EYE;  Surgeon: Fabio Pierce, MD;  Location: [facility] ORS;  Service: Ophthalmology;  Laterality: Right;  CDE: 10.15   COLONOSCOPY  06/04/2002   Normal colon and rectum   COLONOSCOPY  07/08/2012   Procedure: COLONOSCOPY;  Surgeon: Corbin Ade, MD;  Location: [facility] ENDO SUITE;  Service: Endoscopy;  Laterality: N/A;  11:30   COLONOSCOPY N/A 10/02/2017   Procedure: COLONOSCOPY;  Surgeon: Corbin Ade, MD;  Location: [facility] ENDO SUITE;  Service: Endoscopy;  Laterality: N/A;  10:30am   COLONOSCOPY WITH PROPOFOL N/A 03/13/2023   Procedure: COLONOSCOPY WITH PROPOFOL;  Surgeon: Lanelle Bal, DO;  Location: [facility] ENDO SUITE;  Service: Endoscopy;  Laterality: N/A;  1215pm, asa 3   CORONARY ANGIOPLASTY WITH STENT PLACEMENT  02/01/2016   CORONARY ARTERY BYPASS GRAFT  2003   LIMA to LAD,LIMA to distal RCA,SVG to ramus intermediate vessel.   ESOPHAGOGASTRODUODENOSCOPY (EGD) WITH PROPOFOL N/A 11/21/2022   Procedure: ESOPHAGOGASTRODUODENOSCOPY (EGD) WITH PROPOFOL;  Surgeon: Napoleon Form, MD;  Location: [facility] ENDOSCOPY;  Service: Gastroenterology;  Laterality: N/A;   ESOPHAGOGASTRODUODENOSCOPY (  EGD) WITH PROPOFOL N/A 12/27/2022   Procedure: ESOPHAGOGASTRODUODENOSCOPY (EGD) WITH PROPOFOL;  Surgeon: Lanelle Bal, DO;  Location: [facility] ENDO SUITE;  Service: Endoscopy;  Laterality: N/A;   ESOPHAGOGASTRODUODENOSCOPY (EGD) WITH PROPOFOL N/A 02/15/2023   Procedure: ESOPHAGOGASTRODUODENOSCOPY (EGD) WITH PROPOFOL;  Surgeon: Lanelle Bal, DO;  Location: [facility] ENDO  SUITE;  Service: Endoscopy;  Laterality: N/A;   FEMORAL REVISION Right 03/30/2019   Procedure: Femoral revision right total hip arthroplasty;  Surgeon: Ollen Gross, MD;  Location: [facility] ORS;  Service: Orthopedics;  Laterality: Right;    FOREIGN BODY REMOVAL Right 04/02/2014   Procedure: FOREIGN BODY REMOVAL RIGHT FOOT;  Surgeon: Dalia Heading, MD;  Location: [facility] ORS;  Service: General;  Laterality: Right;   JOINT REPLACEMENT     JOINT REPLACEMENT     LUMBAR DISC SURGERY     POLYPECTOMY  03/13/2023   Procedure: POLYPECTOMY;  Surgeon: Lanelle Bal, DO;  Location: [facility] ENDO [facility]ITE;  Service: Endoscopy;;   REVISION TOTAL HIP ARTHROPLASTY Bilateral 2005-2012   right-left   RIGHT/LEFT HEART CATH AND CORONARY/GRAFT ANGIOGRAPHY N/A 09/27/2022   Procedure: RIGHT/LEFT HEART CATH AND CORONARY/GRAFT ANGIOGRAPHY;  Surgeon: [place]azil[place], Peter M, MD;  Location: [facility] INVASIVE CV LAB;  Service: Cardiovascular;  Laterality: N/A;   SHOULDER OPEN ROTATOR CUFF REPAIR Right    TOTAL HIP ARTHROPLASTY Right 1999   TOTAL HIP ARTHROPLASTY Left 2008   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     Outpatient Encounter Medications as of 05/20/2023  Medication Sig   acetaminophen (TYLENOL) 325 MG tablet Take 2 tablets (650 mg total) by mouth every 6 (six) hours as needed for mild pain (or Fever >/= 101).   atorvastatin (LIPITOR) 80 MG tablet TAKE 1 TABLET BY MOUTH EVERY DAY (Patient taking differently: Take 80 mg by mouth daily.)   dapagliflozin propanediol (FARXIGA) 10 MG TABS tablet Take 10 mg by mouth daily.   Iron, Ferrous Sulfate, 325 (65 Fe) MG TABS Take 325 mg by mouth daily.   metoprolol succinate (TOPROL-XL) 25 MG 24 hr tablet Take 1 tablet (25 mg total) by mouth daily.   nitroGLYCERIN (NITROSTAT) 0.4 MG SL tablet Place 1 tablet  (0.4 mg total) under the tongue every 5 (five) minutes x 3 doses as needed for chest pain (if no relief after 3rd dose, proceed to the ED for an evaluation or call 911).   pantoprazole (PROTONIX) 40 MG tablet Take 1 tablet (40 mg total) by mouth 2 (two) times daily.   Potassium Chloride ER 20 MEQ TBCR Take 1 tablet (20 mEq total) by mouth daily.   silver sulfADIAZINE (SILVADENE) 1 % cream Apply 1 Application topically daily.   spironolactone (ALDACTONE) 25 MG tablet Take 0.5 tablets (12.5 mg total) by mouth daily.   torsemide (DEMADEX) 20 MG tablet Take 2 tablets (40 mg total) by mouth daily.   No facility-administered encounter medications on file as of 05/20/2023.     There were no vitals filed for this visit. There is no height or weight on file to calculate BMI.   PHYSICAL EXAM GENERAL:alert, no distress and comfortable SKIN: no rash  EYES: sclera clear NECK: without mass LYMPH:  no palpable cervical or supraclavicular lymphadenopathy  LUNGS: clear with normal breathing effort HEART: regular rate & rhythm, no lower extremity edema ABDOMEN: abdomen soft, non-tender and normal bowel sounds NEURO:  alert & oriented x 3 with fluent speech, no focal motor/sensory deficits Breast exam:  PAC without erythema    CBC    Component Value Date/Time   WBC 6.0 05/07/2023 0917   WBC 6.3 04/13/2023 0959   RBC 2.03 (L) 05/07/2023 0917   HGB 6.9 (LL) 05/07/2023 0917   HGB 7.5 (L) 02/25/2023 1005   HCT 21.9 (L) 05/07/2023 0917   HCT 22.9 (L) 02/25/2023 1005   PLT 249 05/07/2023 0917   PLT 200 02/25/2023 1005   MCV 107.9 (H) 05/07/2023 0917   MCV 104 (H) 02/25/2023 1005   MCH 34.0 05/07/2023 0917   MCHC 31.5 05/07/2023 0917   RDW 22.6 (H) 05/07/2023 0917   RDW 20.4 (H) 02/25/2023 1005   LYMPHSABS 0.7 05/07/2023 0917   LYMPHSABS 1.0 02/19/2023 1005   MONOABS 0.6 05/07/2023 0917   EOSABS 0.2 05/07/2023 0917   EOSABS 0.3 02/19/2023 1005   BASOSABS 0.0 05/07/2023 0917   BASOSABS 0.0  02/19/2023 1005     CMP     Component Value Date/Time   NA 137 05/15/2023 0931   K 3.6 05/15/2023 0931   CL 97 05/15/2023 0931   CO2 25 05/15/2023 0931   GLUCOSE 111 (H) 05/15/2023 0931   GLUCOSE 138 (H) 05/03/2023 0951   BUN 17 05/15/2023 0931   CREATININE 1.06 05/15/2023 0931   CREATININE 1.01 01/10/2023 0958   CALCIUM 8.2 (L) 05/15/2023 0931   PROT 5.8 (L) 03/31/2023 1004   PROT 6.3 11/29/2022 1406   ALBUMIN 2.9 (L) 04/08/2023 0115   ALBUMIN 3.7 11/29/2022 1406   AST 67 (H) 03/31/2023 1004   ALT 41 03/31/2023 1004   ALKPHOS 284 (H) 03/31/2023 1004   BILITOT 3.5 (H) 03/31/2023 1004   BILITOT 2.2 (H) 11/29/2022 1406   GFRNONAA >60 05/03/2023 0951   GFRAA 97 07/19/2020 1036     ASSESSMENT & PLAN:81 y.o. male with a history of acute on chronic heart failure, atrial fibrillation, peripheral neuropathy, and lumbar disc disease.   1.  Anemia, MDS -Bone marrow biopsy: hypercellular bone marrow (50%) with dysphoretic changes, occasional hypolobated neutrophils, abnormally lobated megakaryocytes (<10%) and ring sideroblasts (15%).  -S/p 4 units pRBCs, started Aranesp 200 mg q14 days in 04/2023 -Hgb down to 6.9 on 9/3, transfused again, continues Aranesp with Hgb goal 9-11     PLAN:  No orders of the defined types were placed in this encounter.     All questions were answered. The patient knows to call the clinic with any problems, questions or concerns. No barriers to learning were detected. I spent *** counseling the patient face to face. The total time spent in the appointment was *** and more than 50% was on counseling, review of test results, and coordination of care.   Santiago Glad, NP-C @DATE @

## 2023-05-20 ENCOUNTER — Inpatient Hospital Stay: Payer: Medicare HMO

## 2023-05-20 ENCOUNTER — Encounter: Payer: Self-pay | Admitting: Nurse Practitioner

## 2023-05-20 ENCOUNTER — Inpatient Hospital Stay (HOSPITAL_BASED_OUTPATIENT_CLINIC_OR_DEPARTMENT_OTHER): Payer: Medicare HMO | Admitting: Nurse Practitioner

## 2023-05-20 VITALS — BP 92/62 | HR 93 | Temp 98.2°F | Resp 16 | Wt 191.8 lb

## 2023-05-20 DIAGNOSIS — D649 Anemia, unspecified: Secondary | ICD-10-CM

## 2023-05-20 DIAGNOSIS — I4892 Unspecified atrial flutter: Secondary | ICD-10-CM | POA: Diagnosis not present

## 2023-05-20 DIAGNOSIS — I272 Pulmonary hypertension, unspecified: Secondary | ICD-10-CM | POA: Diagnosis not present

## 2023-05-20 DIAGNOSIS — I5032 Chronic diastolic (congestive) heart failure: Secondary | ICD-10-CM | POA: Diagnosis not present

## 2023-05-20 DIAGNOSIS — D500 Iron deficiency anemia secondary to blood loss (chronic): Secondary | ICD-10-CM | POA: Diagnosis not present

## 2023-05-20 DIAGNOSIS — Z556 Problems related to health literacy: Secondary | ICD-10-CM | POA: Diagnosis not present

## 2023-05-20 DIAGNOSIS — I4821 Permanent atrial fibrillation: Secondary | ICD-10-CM | POA: Diagnosis not present

## 2023-05-20 DIAGNOSIS — D461 Refractory anemia with ring sideroblasts: Secondary | ICD-10-CM | POA: Diagnosis not present

## 2023-05-20 DIAGNOSIS — E785 Hyperlipidemia, unspecified: Secondary | ICD-10-CM | POA: Diagnosis not present

## 2023-05-20 DIAGNOSIS — D469 Myelodysplastic syndrome, unspecified: Secondary | ICD-10-CM

## 2023-05-20 DIAGNOSIS — D539 Nutritional anemia, unspecified: Secondary | ICD-10-CM | POA: Diagnosis not present

## 2023-05-20 DIAGNOSIS — I255 Ischemic cardiomyopathy: Secondary | ICD-10-CM | POA: Diagnosis not present

## 2023-05-20 DIAGNOSIS — G629 Polyneuropathy, unspecified: Secondary | ICD-10-CM | POA: Diagnosis not present

## 2023-05-20 DIAGNOSIS — G8929 Other chronic pain: Secondary | ICD-10-CM | POA: Diagnosis not present

## 2023-05-20 DIAGNOSIS — K2971 Gastritis, unspecified, with bleeding: Secondary | ICD-10-CM

## 2023-05-20 DIAGNOSIS — I872 Venous insufficiency (chronic) (peripheral): Secondary | ICD-10-CM | POA: Diagnosis not present

## 2023-05-20 DIAGNOSIS — I130 Hypertensive heart and chronic kidney disease with heart failure and stage 1 through stage 4 chronic kidney disease, or unspecified chronic kidney disease: Secondary | ICD-10-CM | POA: Diagnosis not present

## 2023-05-20 DIAGNOSIS — L97811 Non-pressure chronic ulcer of other part of right lower leg limited to breakdown of skin: Secondary | ICD-10-CM | POA: Diagnosis not present

## 2023-05-20 DIAGNOSIS — E669 Obesity, unspecified: Secondary | ICD-10-CM | POA: Diagnosis not present

## 2023-05-20 DIAGNOSIS — R32 Unspecified urinary incontinence: Secondary | ICD-10-CM | POA: Diagnosis not present

## 2023-05-20 DIAGNOSIS — D6869 Other thrombophilia: Secondary | ICD-10-CM | POA: Diagnosis not present

## 2023-05-20 DIAGNOSIS — I739 Peripheral vascular disease, unspecified: Secondary | ICD-10-CM | POA: Diagnosis not present

## 2023-05-20 DIAGNOSIS — I2510 Atherosclerotic heart disease of native coronary artery without angina pectoris: Secondary | ICD-10-CM | POA: Diagnosis not present

## 2023-05-20 DIAGNOSIS — I350 Nonrheumatic aortic (valve) stenosis: Secondary | ICD-10-CM | POA: Diagnosis not present

## 2023-05-20 DIAGNOSIS — N189 Chronic kidney disease, unspecified: Secondary | ICD-10-CM | POA: Diagnosis not present

## 2023-05-20 DIAGNOSIS — M545 Low back pain, unspecified: Secondary | ICD-10-CM | POA: Diagnosis not present

## 2023-05-20 DIAGNOSIS — Z6824 Body mass index (BMI) 24.0-24.9, adult: Secondary | ICD-10-CM | POA: Diagnosis not present

## 2023-05-20 LAB — FERRITIN: Ferritin: 154 ng/mL (ref 24–336)

## 2023-05-20 LAB — CBC WITH DIFFERENTIAL (CANCER CENTER ONLY)
Abs Immature Granulocytes: 0.07 10*3/uL (ref 0.00–0.07)
Basophils Absolute: 0 10*3/uL (ref 0.0–0.1)
Basophils Relative: 1 %
Eosinophils Absolute: 0.3 10*3/uL (ref 0.0–0.5)
Eosinophils Relative: 5 %
HCT: 22.7 % — ABNORMAL LOW (ref 39.0–52.0)
Hemoglobin: 7.2 g/dL — ABNORMAL LOW (ref 13.0–17.0)
Immature Granulocytes: 1 %
Lymphocytes Relative: 13 %
Lymphs Abs: 0.8 10*3/uL (ref 0.7–4.0)
MCH: 34.1 pg — ABNORMAL HIGH (ref 26.0–34.0)
MCHC: 31.7 g/dL (ref 30.0–36.0)
MCV: 107.6 fL — ABNORMAL HIGH (ref 80.0–100.0)
Monocytes Absolute: 0.8 10*3/uL (ref 0.1–1.0)
Monocytes Relative: 13 %
Neutro Abs: 4.3 10*3/uL (ref 1.7–7.7)
Neutrophils Relative %: 67 %
Platelet Count: 187 10*3/uL (ref 150–400)
RBC: 2.11 MIL/uL — ABNORMAL LOW (ref 4.22–5.81)
RDW: 27.3 % — ABNORMAL HIGH (ref 11.5–15.5)
WBC Count: 6.3 10*3/uL (ref 4.0–10.5)
nRBC: 0 % (ref 0.0–0.2)

## 2023-05-20 LAB — SAMPLE TO BLOOD BANK

## 2023-05-20 LAB — PREPARE RBC (CROSSMATCH)

## 2023-05-20 MED ORDER — DARBEPOETIN ALFA 300 MCG/0.6ML IJ SOSY
300.0000 ug | PREFILLED_SYRINGE | Freq: Once | INTRAMUSCULAR | Status: AC
  Administered 2023-05-20: 300 ug via SUBCUTANEOUS
  Filled 2023-05-20: qty 0.6

## 2023-05-20 NOTE — Patient Instructions (Signed)

## 2023-05-21 ENCOUNTER — Other Ambulatory Visit: Payer: Self-pay

## 2023-05-21 ENCOUNTER — Inpatient Hospital Stay: Payer: Medicare HMO

## 2023-05-21 ENCOUNTER — Telehealth: Payer: Self-pay | Admitting: Hematology

## 2023-05-21 DIAGNOSIS — D461 Refractory anemia with ring sideroblasts: Secondary | ICD-10-CM | POA: Diagnosis not present

## 2023-05-21 DIAGNOSIS — D469 Myelodysplastic syndrome, unspecified: Secondary | ICD-10-CM

## 2023-05-21 MED ORDER — ACETAMINOPHEN 325 MG PO TABS
650.0000 mg | ORAL_TABLET | Freq: Once | ORAL | Status: AC
  Administered 2023-05-21: 650 mg via ORAL
  Filled 2023-05-21: qty 2

## 2023-05-21 MED ORDER — SODIUM CHLORIDE 0.9% IV SOLUTION
250.0000 mL | Freq: Once | INTRAVENOUS | Status: AC
  Administered 2023-05-21: 250 mL via INTRAVENOUS

## 2023-05-21 MED ORDER — DIPHENHYDRAMINE HCL 25 MG PO CAPS
25.0000 mg | ORAL_CAPSULE | Freq: Once | ORAL | Status: AC
  Administered 2023-05-21: 25 mg via ORAL
  Filled 2023-05-21: qty 1

## 2023-05-21 NOTE — Patient Instructions (Signed)
Blood Transfusion, Adult A blood transfusion is a procedure in which you receive blood or a type of blood cell (blood component) through an IV. You may need a blood transfusion when you have a low blood count, which is a low number of any blood cell. This may result from a bleeding disorder, illness, injury, or surgery. The blood may come from a donor, or you may be able to have your own blood collected and stored (autologous blood donation) before a planned surgery. The blood given in a transfusion may be made up of different blood components. You may receive: Red blood cells. These carry oxygen to the cells in the body. Platelets. These help your blood to clot. Plasma. This is the liquid part of your blood. It carries proteins and other substances throughout the body. White blood cells. These help you fight infections. If you have hemophilia or another clotting disorder, you may also receive other types of blood products. Depending on the type of blood product, this procedure may take 1-4 hours to complete. Tell a health care provider about: Any bleeding problems you have. Any previous reactions you have had during a blood transfusion. Any allergies you have. All medicines you are taking, including vitamins, herbs, eye drops, creams, and over-the-counter medicines. Any surgeries you have had. Any medical conditions you have. Whether you are pregnant or may be pregnant. What are the risks? Talk with your health care provider about risks. The most common problems include: A mild allergic reaction, such as red, swollen areas of skin (hives) and itching. Fever or chills. This may be the body's response to new blood cells received. This may occur during or up to 4 hours after the transfusion. More serious problems may include: A serious allergic reaction that causes difficulty breathing or swelling around the face and lips. Transfusion-associated circulatory overload (TACO), or too much fluid in  the lungs. This may cause breathing problems. Transfusion-related acute lung injury (TRALI), which causes breathing difficulty and low oxygen in the blood. This can occur within hours of the transfusion or several days later. Iron overload. This can happen after receiving many blood transfusions over a period of time. Infection or virus being transmitted. This is rare because donated blood is carefully tested before it is given. Hemolytic transfusion reaction. This is rare. It happens when the body's defense system (immune system)tries to attack the new blood cells. Symptoms may include fever, chills, nausea, low blood pressure, and low back or chest pain. Transfusion-associated graft-versus-host disease (TAGVHD). This is rare. It happens when donated cells attack the body's healthy tissues. What happens before the procedure? You will have a blood test to check your blood type. This test is done to know what kind of blood your body will accept and to match it to the donor blood. If you are going to have a planned surgery, you may be able to do an autologous blood donation. This may be done in case you need to have a transfusion. You will have your temperature, blood pressure, and pulse checked before the transfusion. If you have had an allergic reaction to a transfusion in the past, you may be given medicine to help prevent a reaction. This medicine may be given to you by mouth (orally) or through an IV. What happens during the procedure?  An IV will be inserted into one of your veins. The bag of blood will be attached to your IV. The blood will then enter through your vein. Your temperature, blood pressure,  and pulse will be monitored during the transfusion. This monitoring is done to detect early signs of a transfusion reaction. Tell your nurse right away if you have any of these symptoms during the transfusion: Shortness of breath or trouble breathing. Chest or back pain. Fever or  chills. Itching or hives. If you have any signs or symptoms of a reaction, your transfusion will be stopped and you may be given medicine. When the transfusion is complete, your IV will be removed. Pressure may be applied to the IV site for a few minutes. A bandage (dressing)will be applied. The procedure may vary among health care providers and hospitals. What happens after the procedure? Your temperature, blood pressure, pulse, breathing rate, and blood oxygen level will be monitored until you leave the hospital or clinic. Your blood may be tested to see how you have responded to the transfusion. You may be warmed with fluids or blankets to maintain a normal body temperature. If you receive your blood transfusion in an outpatient setting, you will be told whom to contact to report any reactions. Where to find more information Visit the [employer]: redcross.org Summary A blood transfusion is a procedure in which you receive blood or a type of blood cell (blood component) through an IV. The blood given in a transfusion may be made up of different blood components. You may receive red blood cells, platelets, plasma, or white blood cells depending on the condition treated. Your temperature, blood pressure, and pulse will be monitored before, during, and after the transfusion. After the transfusion, your blood may be tested to see how your body has responded. This information is not intended to replace advice given to you by your health care provider. Make sure you discuss any questions you have with your health care provider. Document Revised: 11/17/2021 Document Reviewed: 11/17/2021 Elsevier Patient Education  2024 [employer].

## 2023-05-22 DIAGNOSIS — I739 Peripheral vascular disease, unspecified: Secondary | ICD-10-CM | POA: Diagnosis not present

## 2023-05-22 DIAGNOSIS — I872 Venous insufficiency (chronic) (peripheral): Secondary | ICD-10-CM | POA: Diagnosis not present

## 2023-05-22 DIAGNOSIS — I350 Nonrheumatic aortic (valve) stenosis: Secondary | ICD-10-CM | POA: Diagnosis not present

## 2023-05-22 DIAGNOSIS — D500 Iron deficiency anemia secondary to blood loss (chronic): Secondary | ICD-10-CM | POA: Diagnosis not present

## 2023-05-22 DIAGNOSIS — Z6824 Body mass index (BMI) 24.0-24.9, adult: Secondary | ICD-10-CM | POA: Diagnosis not present

## 2023-05-22 DIAGNOSIS — D539 Nutritional anemia, unspecified: Secondary | ICD-10-CM | POA: Diagnosis not present

## 2023-05-22 DIAGNOSIS — N189 Chronic kidney disease, unspecified: Secondary | ICD-10-CM | POA: Diagnosis not present

## 2023-05-22 DIAGNOSIS — M545 Low back pain, unspecified: Secondary | ICD-10-CM | POA: Diagnosis not present

## 2023-05-22 DIAGNOSIS — R32 Unspecified urinary incontinence: Secondary | ICD-10-CM | POA: Diagnosis not present

## 2023-05-22 DIAGNOSIS — I255 Ischemic cardiomyopathy: Secondary | ICD-10-CM | POA: Diagnosis not present

## 2023-05-22 DIAGNOSIS — I2510 Atherosclerotic heart disease of native coronary artery without angina pectoris: Secondary | ICD-10-CM | POA: Diagnosis not present

## 2023-05-22 DIAGNOSIS — I4821 Permanent atrial fibrillation: Secondary | ICD-10-CM | POA: Diagnosis not present

## 2023-05-22 DIAGNOSIS — I272 Pulmonary hypertension, unspecified: Secondary | ICD-10-CM | POA: Diagnosis not present

## 2023-05-22 DIAGNOSIS — G629 Polyneuropathy, unspecified: Secondary | ICD-10-CM | POA: Diagnosis not present

## 2023-05-22 DIAGNOSIS — G8929 Other chronic pain: Secondary | ICD-10-CM | POA: Diagnosis not present

## 2023-05-22 DIAGNOSIS — Z556 Problems related to health literacy: Secondary | ICD-10-CM | POA: Diagnosis not present

## 2023-05-22 DIAGNOSIS — E785 Hyperlipidemia, unspecified: Secondary | ICD-10-CM | POA: Diagnosis not present

## 2023-05-22 DIAGNOSIS — I4892 Unspecified atrial flutter: Secondary | ICD-10-CM | POA: Diagnosis not present

## 2023-05-22 DIAGNOSIS — L97811 Non-pressure chronic ulcer of other part of right lower leg limited to breakdown of skin: Secondary | ICD-10-CM | POA: Diagnosis not present

## 2023-05-22 DIAGNOSIS — E669 Obesity, unspecified: Secondary | ICD-10-CM | POA: Diagnosis not present

## 2023-05-22 DIAGNOSIS — D6869 Other thrombophilia: Secondary | ICD-10-CM | POA: Diagnosis not present

## 2023-05-22 DIAGNOSIS — I130 Hypertensive heart and chronic kidney disease with heart failure and stage 1 through stage 4 chronic kidney disease, or unspecified chronic kidney disease: Secondary | ICD-10-CM | POA: Diagnosis not present

## 2023-05-22 DIAGNOSIS — I5032 Chronic diastolic (congestive) heart failure: Secondary | ICD-10-CM | POA: Diagnosis not present

## 2023-05-22 DIAGNOSIS — D469 Myelodysplastic syndrome, unspecified: Secondary | ICD-10-CM | POA: Diagnosis not present

## 2023-05-23 ENCOUNTER — Encounter: Payer: Self-pay | Admitting: Gastroenterology

## 2023-05-23 DIAGNOSIS — L97811 Non-pressure chronic ulcer of other part of right lower leg limited to breakdown of skin: Secondary | ICD-10-CM | POA: Diagnosis not present

## 2023-05-23 DIAGNOSIS — I130 Hypertensive heart and chronic kidney disease with heart failure and stage 1 through stage 4 chronic kidney disease, or unspecified chronic kidney disease: Secondary | ICD-10-CM | POA: Diagnosis not present

## 2023-05-23 DIAGNOSIS — M545 Low back pain, unspecified: Secondary | ICD-10-CM | POA: Diagnosis not present

## 2023-05-23 DIAGNOSIS — I350 Nonrheumatic aortic (valve) stenosis: Secondary | ICD-10-CM | POA: Diagnosis not present

## 2023-05-23 DIAGNOSIS — I4821 Permanent atrial fibrillation: Secondary | ICD-10-CM | POA: Diagnosis not present

## 2023-05-23 DIAGNOSIS — E785 Hyperlipidemia, unspecified: Secondary | ICD-10-CM | POA: Diagnosis not present

## 2023-05-23 DIAGNOSIS — G629 Polyneuropathy, unspecified: Secondary | ICD-10-CM | POA: Diagnosis not present

## 2023-05-23 DIAGNOSIS — I739 Peripheral vascular disease, unspecified: Secondary | ICD-10-CM | POA: Diagnosis not present

## 2023-05-23 DIAGNOSIS — I2510 Atherosclerotic heart disease of native coronary artery without angina pectoris: Secondary | ICD-10-CM | POA: Diagnosis not present

## 2023-05-23 DIAGNOSIS — G8929 Other chronic pain: Secondary | ICD-10-CM | POA: Diagnosis not present

## 2023-05-23 DIAGNOSIS — E669 Obesity, unspecified: Secondary | ICD-10-CM | POA: Diagnosis not present

## 2023-05-23 DIAGNOSIS — I872 Venous insufficiency (chronic) (peripheral): Secondary | ICD-10-CM | POA: Diagnosis not present

## 2023-05-23 DIAGNOSIS — I255 Ischemic cardiomyopathy: Secondary | ICD-10-CM | POA: Diagnosis not present

## 2023-05-23 DIAGNOSIS — D539 Nutritional anemia, unspecified: Secondary | ICD-10-CM | POA: Diagnosis not present

## 2023-05-23 DIAGNOSIS — I4892 Unspecified atrial flutter: Secondary | ICD-10-CM | POA: Diagnosis not present

## 2023-05-23 DIAGNOSIS — D6869 Other thrombophilia: Secondary | ICD-10-CM | POA: Diagnosis not present

## 2023-05-23 DIAGNOSIS — Z556 Problems related to health literacy: Secondary | ICD-10-CM | POA: Diagnosis not present

## 2023-05-23 DIAGNOSIS — I272 Pulmonary hypertension, unspecified: Secondary | ICD-10-CM | POA: Diagnosis not present

## 2023-05-23 DIAGNOSIS — R32 Unspecified urinary incontinence: Secondary | ICD-10-CM | POA: Diagnosis not present

## 2023-05-23 DIAGNOSIS — Z6824 Body mass index (BMI) 24.0-24.9, adult: Secondary | ICD-10-CM | POA: Diagnosis not present

## 2023-05-23 DIAGNOSIS — I5032 Chronic diastolic (congestive) heart failure: Secondary | ICD-10-CM | POA: Diagnosis not present

## 2023-05-23 DIAGNOSIS — D500 Iron deficiency anemia secondary to blood loss (chronic): Secondary | ICD-10-CM | POA: Diagnosis not present

## 2023-05-23 DIAGNOSIS — D469 Myelodysplastic syndrome, unspecified: Secondary | ICD-10-CM | POA: Diagnosis not present

## 2023-05-23 DIAGNOSIS — N189 Chronic kidney disease, unspecified: Secondary | ICD-10-CM | POA: Diagnosis not present

## 2023-05-24 ENCOUNTER — Encounter: Payer: Self-pay | Admitting: Cardiology

## 2023-05-24 ENCOUNTER — Encounter: Payer: Self-pay | Admitting: Student

## 2023-05-24 ENCOUNTER — Ambulatory Visit: Payer: Medicare HMO | Attending: Student | Admitting: Student

## 2023-05-24 VITALS — BP 108/56 | HR 96 | Ht 70.0 in | Wt 195.0 lb

## 2023-05-24 DIAGNOSIS — Z79899 Other long term (current) drug therapy: Secondary | ICD-10-CM

## 2023-05-24 DIAGNOSIS — I2510 Atherosclerotic heart disease of native coronary artery without angina pectoris: Secondary | ICD-10-CM | POA: Diagnosis not present

## 2023-05-24 DIAGNOSIS — I4821 Permanent atrial fibrillation: Secondary | ICD-10-CM | POA: Diagnosis not present

## 2023-05-24 DIAGNOSIS — D469 Myelodysplastic syndrome, unspecified: Secondary | ICD-10-CM | POA: Diagnosis not present

## 2023-05-24 DIAGNOSIS — I350 Nonrheumatic aortic (valve) stenosis: Secondary | ICD-10-CM | POA: Diagnosis not present

## 2023-05-24 DIAGNOSIS — I5023 Acute on chronic systolic (congestive) heart failure: Secondary | ICD-10-CM

## 2023-05-24 LAB — BPAM RBC
Blood Product Expiration Date: 202410142359
Blood Product Expiration Date: 202410142359
ISSUE DATE / TIME: 202409161246
ISSUE DATE / TIME: 202409170802
Unit Type and Rh: 5100
Unit Type and Rh: 5100

## 2023-05-24 LAB — TYPE AND SCREEN
ABO/RH(D): O POS
Antibody Screen: POSITIVE
Unit division: 0
Unit division: 0

## 2023-05-24 MED ORDER — TORSEMIDE 20 MG PO TABS: 60.0000 mg | ORAL_TABLET | Freq: Every day | ORAL | 3 refills | Status: DC

## 2023-05-24 NOTE — Patient Instructions (Signed)
Medication Instructions:  Your physician has recommended you make the following change in your medication:   Increase Torsemide to 60 mg Daily   *If you need a refill on your cardiac medications before your next appointment, please call your pharmacy*   Lab Work: Your physician recommends that you return for lab work at next lab draw.   If you have labs (blood work) drawn today and your tests are completely normal, you will receive your results only by: MyChart Message (if you have MyChart) OR A paper copy in the mail If you have any lab test that is abnormal or we need to change your treatment, we will call you to review the results.   Testing/Procedures: NONE    Follow-Up: At [facility], you and your health needs are our priority.  As part of our continuing mission to provide you with exceptional heart care, we have created designated Provider Care Teams.  These Care Teams include your primary Cardiologist (physician) and Advanced Practice Providers (APPs -  Physician Assistants and Nurse Practitioners) who all work together to provide you with the care you need, when you need it.  We recommend signing up for the patient portal called "MyChart".  Sign up information is provided on this After Visit Summary.  MyChart is used to connect with patients for Virtual Visits (Telemedicine).  Patients are able to view lab/test results, encounter notes, upcoming appointments, etc.  Non-urgent messages can be sent to your provider as well.   To learn more about what you can do with MyChart, go to [email or web address].    Your next appointment:   3 month(s)  Provider:   You may see [name], MD or one of the following Advanced Practice Providers on your designated Care Team:   Randall An, PA-C  Jacolyn Reedy, PA-C     Other Instructions Thank you for choosing [facility]!

## 2023-05-24 NOTE — Progress Notes (Unsigned)
Cardiology Office Note    Date:  05/24/2023  ID:  Leonard Cisneros, DOB [birth year], MRN [id number] Cardiologist: [name], MD    History of Present Illness:    Leonard Cisneros is a 82 y.o. 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  He experienced a prolonged hospitalization from 7/28 - 04/14/2023 for an acute CHF exacerbation and repeat echocardiogram showed that his EF was reduced at 30 to 35% with global hypokinesis, mild MS and mild MR/MS.  He responded well to IV Lasix and metolazone during admission.  Troponin values did peak at 2438 but he did not undergo repeat cardiac catheterization given his anemia.  His hemoglobin had been as low as 6.2 on admission with Hemoccult positive and he had recently undergone EGD which showed gastritis and nonbleeding ulcer and colonoscopy had shown nonbleeding hemorrhoids.  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  He did follow-up with the heart failure TOC clinic on 04/17/2023 and volume status was elevated with Reds vest at 42 and torsemide was increased to 40 mg twice daily with him being continued on Farxiga and Toprol-XL.  Was not felt to be a candidate for advanced therapies.  He did follow-up with Dr. Diona Cisneros on 04/23/2023 and reported NYHA class II dyspnea and did have weakness along his lower extremity and was using crutches and a walker as needed.  He had previously undergone workup for  Watchman device placement but was felt to not be a candidate for short-term anticoagulation at that time.  BP limited further adjustments of GDMT and no changes are made at that time.  Based on follow-up labs, torsemide was reduced to 40 mg once daily and he was started on Spironolactone 12.5 mg daily with plans for follow-up labs within 2 weeks.  Repeat labs on 05/15/2023 showed his creatinine was improved to 1.06 and K+ was at 3.6.  ROS: ***  Studies Reviewed:   EKG: EKG is not ordered today.   Echocardiogram: 03/2023 IMPRESSIONS     1. Left ventricular ejection fraction, by estimation, is 30 to 35%. The  left ventricle has moderately decreased function. The left ventricle  demonstrates global hypokinesis. The left ventricular internal cavity size  was mildly dilated. There is mild  concentric left ventricular hypertrophy. Left ventricular diastolic  parameters are indeterminate.   2. Right ventricular systolic function is mildly reduced. The right  ventricular size is mildly enlarged. Tricuspid regurgitation signal is  inadequate for assessing PA pressure.   3. Left atrial size was severely dilated.   4. Right atrial size was moderately dilated.   5. The mitral valve is normal in structure. Mild mitral valve  regurgitation. Mild mitral stenosis. The mean mitral valve gradient is 6.0  mmHg. Moderate mitral annular calcification.   6. The aortic valve is tricuspid. There is moderate calcification of the  aortic valve. Aortic valve regurgitation is not visualized. Mild aortic  valve stenosis. Aortic valve mean gradient measures 14.0 mmHg.  7. The inferior vena cava is dilated in size with <50% respiratory  variability, suggesting right atrial pressure of 15 mmHg.   Risk Assessment/Calculations:   {Does this patient have ATRIAL FIBRILLATION?:[id number]}           Physical Exam:   VS:  BP (!) 108/56   Pulse 96   Ht 5\' 10"  (1.778 m)   Wt 195 lb (88.5 kg)   SpO2 95%   BMI  27.98 kg/m    Wt Readings from Last 3 Encounters:  05/24/23 195 lb (88.5 kg)  05/20/23 191 lb 12.8 oz (87 kg)  04/23/23 173 lb (78.5 kg)     GEN: Well nourished, well developed in no acute distress NECK: No JVD; No carotid bruits CARDIAC: RRR, no murmurs, rubs, gallops RESPIRATORY:  Clear to auscultation without rales, wheezing or rhonchi  ABDOMEN: Appears non-distended. No obvious abdominal masses. EXTREMITIES: No clubbing or cyanosis. No *** edema.  Distal pedal pulses are 2+ bilaterally.   Assessment and Plan:      {Are you ordering a CV Procedure (e.g. stress test, cath, DCCV, TEE, etc)?   Press F2        :[id number]}   Signed, [name], PA-C

## 2023-05-25 ENCOUNTER — Encounter: Payer: Self-pay | Admitting: Student

## 2023-05-25 ENCOUNTER — Other Ambulatory Visit: Payer: Self-pay | Admitting: Gastroenterology

## 2023-05-25 ENCOUNTER — Encounter: Payer: Self-pay | Admitting: Cardiology

## 2023-05-28 ENCOUNTER — Ambulatory Visit: Payer: Medicare HMO | Admitting: Family Medicine

## 2023-05-30 DIAGNOSIS — M545 Low back pain, unspecified: Secondary | ICD-10-CM | POA: Diagnosis not present

## 2023-05-30 DIAGNOSIS — D539 Nutritional anemia, unspecified: Secondary | ICD-10-CM | POA: Diagnosis not present

## 2023-05-30 DIAGNOSIS — I4821 Permanent atrial fibrillation: Secondary | ICD-10-CM | POA: Diagnosis not present

## 2023-05-30 DIAGNOSIS — D469 Myelodysplastic syndrome, unspecified: Secondary | ICD-10-CM | POA: Diagnosis not present

## 2023-05-30 DIAGNOSIS — I739 Peripheral vascular disease, unspecified: Secondary | ICD-10-CM | POA: Diagnosis not present

## 2023-05-30 DIAGNOSIS — I255 Ischemic cardiomyopathy: Secondary | ICD-10-CM | POA: Diagnosis not present

## 2023-05-30 DIAGNOSIS — D6869 Other thrombophilia: Secondary | ICD-10-CM | POA: Diagnosis not present

## 2023-05-30 DIAGNOSIS — G8929 Other chronic pain: Secondary | ICD-10-CM | POA: Diagnosis not present

## 2023-05-30 DIAGNOSIS — N189 Chronic kidney disease, unspecified: Secondary | ICD-10-CM | POA: Diagnosis not present

## 2023-05-30 DIAGNOSIS — I272 Pulmonary hypertension, unspecified: Secondary | ICD-10-CM | POA: Diagnosis not present

## 2023-05-30 DIAGNOSIS — G629 Polyneuropathy, unspecified: Secondary | ICD-10-CM | POA: Diagnosis not present

## 2023-05-30 DIAGNOSIS — I4892 Unspecified atrial flutter: Secondary | ICD-10-CM | POA: Diagnosis not present

## 2023-05-30 DIAGNOSIS — I350 Nonrheumatic aortic (valve) stenosis: Secondary | ICD-10-CM | POA: Diagnosis not present

## 2023-05-30 DIAGNOSIS — L97811 Non-pressure chronic ulcer of other part of right lower leg limited to breakdown of skin: Secondary | ICD-10-CM | POA: Diagnosis not present

## 2023-05-30 DIAGNOSIS — I5032 Chronic diastolic (congestive) heart failure: Secondary | ICD-10-CM | POA: Diagnosis not present

## 2023-05-30 DIAGNOSIS — I130 Hypertensive heart and chronic kidney disease with heart failure and stage 1 through stage 4 chronic kidney disease, or unspecified chronic kidney disease: Secondary | ICD-10-CM | POA: Diagnosis not present

## 2023-05-30 DIAGNOSIS — E669 Obesity, unspecified: Secondary | ICD-10-CM | POA: Diagnosis not present

## 2023-05-30 DIAGNOSIS — Z556 Problems related to health literacy: Secondary | ICD-10-CM | POA: Diagnosis not present

## 2023-05-30 DIAGNOSIS — E785 Hyperlipidemia, unspecified: Secondary | ICD-10-CM | POA: Diagnosis not present

## 2023-05-30 DIAGNOSIS — D500 Iron deficiency anemia secondary to blood loss (chronic): Secondary | ICD-10-CM | POA: Diagnosis not present

## 2023-05-30 DIAGNOSIS — Z6824 Body mass index (BMI) 24.0-24.9, adult: Secondary | ICD-10-CM | POA: Diagnosis not present

## 2023-05-30 DIAGNOSIS — R32 Unspecified urinary incontinence: Secondary | ICD-10-CM | POA: Diagnosis not present

## 2023-05-30 DIAGNOSIS — I2510 Atherosclerotic heart disease of native coronary artery without angina pectoris: Secondary | ICD-10-CM | POA: Diagnosis not present

## 2023-05-30 DIAGNOSIS — I872 Venous insufficiency (chronic) (peripheral): Secondary | ICD-10-CM | POA: Diagnosis not present

## 2023-05-31 ENCOUNTER — Ambulatory Visit: Payer: Self-pay | Admitting: *Deleted

## 2023-05-31 NOTE — Patient Instructions (Signed)
Visit Information  Thank you for taking time to visit with me today. Please don't hesitate to contact me if I can be of assistance to you.   Following are the goals we discussed today:   Goals Addressed             This Visit's Progress    COMPLETED: manage congestive heart failure, atrial fibrillation- care coordination services   On track    Interventions Today    Flowsheet Row Most Recent Value  Chronic Disease   Chronic disease during today's visit Other, Atrial Fibrillation (AFib), Congestive Heart Failure (CHF)  [hypotension]  General Interventions   General Interventions Discussed/Reviewed General Interventions Reviewed, Doctor Visits  Doctor Visits Discussed/Reviewed Doctor Visits Reviewed, PCP  PCP/Specialist Visits Compliance with follow-up visit  Exercise Interventions   Exercise Discussed/Reviewed Exercise Reviewed, Weight Managment  Weight Management Weight maintenance  Education Interventions   Education Provided Provided Education, Provided Web-based Education  [hypotension]  Provided Verbal Education On Medication, Other, When to see the doctor  [reviewed an action plan list of symptoms that Mr Godwin will be aware of to outreach to medical services]  Pharmacy Interventions   Pharmacy Dicussed/Reviewed Pharmacy Topics Reviewed, Medications and their functions, Affording Medications              Our next appointment is  n/a  on n/a at n/a  Please call the care guide team at [phone] if you need to cancel or reschedule your appointment.   If you are experiencing a Mental Health or Behavioral Health Crisis or need someone to talk to, please call the Suicide and Crisis Lifeline: 988 call the [place] National Suicide Prevention Lifeline: 2488244360 or TTY: 928-244-5952 TTY 587-083-0028) to talk to a trained counselor call 1-800-273-TALK (toll free, 24 hour hotline) call the [facility]: 248-055-0898 call 911   Patient verbalizes  understanding of instructions and care plan provided today and agrees to view in MyChart. Active MyChart status and patient understanding of how to access instructions and care plan via MyChart confirmed with patient.     The patient has been provided with contact information for the care management team and has been advised to call with any health related questions or concerns.    Julian Askin L. Noelle Penner, RN, BSN, CCM, Care Management Coordinator [phone]

## 2023-05-31 NOTE — Patient Outreach (Signed)
Care Coordination   Follow Up & case closure  Visit Note   05/31/2023 Name: Leonard Cisneros MRN: [id number] DOB: 19-Apr-1941  Leonard Cisneros is a 82 y.o. year old male who sees [name], FNP for primary care. I spoke with  Leonard Cisneros by phone today.  What matters to the patients health and wellness today?  Denies any medical concern when assessed for worsening symptoms Reviewed hypotension  related to the noted recent low values of 107/50, 104/58, 92/62, 108/56 Leonard Cisneros reports "it was over exaggerated" related to his low blood pressure values  He states he has not had any dizziness, blurred vision, syncope, confusion, nausea, sleepiness nor weakness He and RN CM discussed further outreaches and he agrees to case closure with outreach as needed     Goals Addressed             This Visit's Progress    manage congestive heart failure, atrial fibrillation- care coordination services   On track    Interventions Today    Flowsheet Row Most Recent Value  Chronic Disease   Chronic disease during today's visit Other, Atrial Fibrillation (AFib), Congestive Heart Failure (CHF)  [hypotension]  General Interventions   General Interventions Discussed/Reviewed General Interventions Reviewed, Doctor Visits  Doctor Visits Discussed/Reviewed Doctor Visits Reviewed, PCP  PCP/Specialist Visits Compliance with follow-up visit  Exercise Interventions   Exercise Discussed/Reviewed Exercise Reviewed, Weight Managment  Weight Management Weight maintenance  Education Interventions   Education Provided Provided Education, Provided Web-based Education  [hypotension]  Provided Verbal Education On Medication, Other, When to see the doctor  [reviewed an action plan list of symptoms that Leonard Canlas will be aware of to outreach to medical services]  Pharmacy Interventions   Pharmacy Dicussed/Reviewed Pharmacy Topics Reviewed, Medications and their functions, Affording Medications               SDOH assessments and interventions completed:  No     Care Coordination Interventions:  Yes, provided   Follow up plan: No further intervention required.   Encounter Outcome:  Patient Visit Completed   Cala Bradford L. Noelle Penner, RN, BSN, CCM, Care Management Coordinator [phone]

## 2023-06-01 ENCOUNTER — Other Ambulatory Visit: Payer: Self-pay | Admitting: Cardiology

## 2023-06-01 DIAGNOSIS — Z79899 Other long term (current) drug therapy: Secondary | ICD-10-CM

## 2023-06-01 DIAGNOSIS — I872 Venous insufficiency (chronic) (peripheral): Secondary | ICD-10-CM

## 2023-06-01 DIAGNOSIS — I5020 Unspecified systolic (congestive) heart failure: Secondary | ICD-10-CM

## 2023-06-03 ENCOUNTER — Inpatient Hospital Stay: Payer: Medicare HMO

## 2023-06-03 VITALS — BP 97/56 | HR 87 | Temp 98.5°F | Resp 20

## 2023-06-03 DIAGNOSIS — G629 Polyneuropathy, unspecified: Secondary | ICD-10-CM | POA: Diagnosis not present

## 2023-06-03 DIAGNOSIS — D469 Myelodysplastic syndrome, unspecified: Secondary | ICD-10-CM | POA: Diagnosis not present

## 2023-06-03 DIAGNOSIS — I2510 Atherosclerotic heart disease of native coronary artery without angina pectoris: Secondary | ICD-10-CM | POA: Diagnosis not present

## 2023-06-03 DIAGNOSIS — D6869 Other thrombophilia: Secondary | ICD-10-CM | POA: Diagnosis not present

## 2023-06-03 DIAGNOSIS — Z6824 Body mass index (BMI) 24.0-24.9, adult: Secondary | ICD-10-CM | POA: Diagnosis not present

## 2023-06-03 DIAGNOSIS — Z556 Problems related to health literacy: Secondary | ICD-10-CM | POA: Diagnosis not present

## 2023-06-03 DIAGNOSIS — G8929 Other chronic pain: Secondary | ICD-10-CM | POA: Diagnosis not present

## 2023-06-03 DIAGNOSIS — I272 Pulmonary hypertension, unspecified: Secondary | ICD-10-CM | POA: Diagnosis not present

## 2023-06-03 DIAGNOSIS — D539 Nutritional anemia, unspecified: Secondary | ICD-10-CM | POA: Diagnosis not present

## 2023-06-03 DIAGNOSIS — I4892 Unspecified atrial flutter: Secondary | ICD-10-CM | POA: Diagnosis not present

## 2023-06-03 DIAGNOSIS — I5032 Chronic diastolic (congestive) heart failure: Secondary | ICD-10-CM | POA: Diagnosis not present

## 2023-06-03 DIAGNOSIS — I350 Nonrheumatic aortic (valve) stenosis: Secondary | ICD-10-CM | POA: Diagnosis not present

## 2023-06-03 DIAGNOSIS — M545 Low back pain, unspecified: Secondary | ICD-10-CM | POA: Diagnosis not present

## 2023-06-03 DIAGNOSIS — N189 Chronic kidney disease, unspecified: Secondary | ICD-10-CM | POA: Diagnosis not present

## 2023-06-03 DIAGNOSIS — I255 Ischemic cardiomyopathy: Secondary | ICD-10-CM | POA: Diagnosis not present

## 2023-06-03 DIAGNOSIS — K2971 Gastritis, unspecified, with bleeding: Secondary | ICD-10-CM

## 2023-06-03 DIAGNOSIS — D461 Refractory anemia with ring sideroblasts: Secondary | ICD-10-CM | POA: Diagnosis not present

## 2023-06-03 DIAGNOSIS — I4821 Permanent atrial fibrillation: Secondary | ICD-10-CM | POA: Diagnosis not present

## 2023-06-03 DIAGNOSIS — I872 Venous insufficiency (chronic) (peripheral): Secondary | ICD-10-CM | POA: Diagnosis not present

## 2023-06-03 DIAGNOSIS — E669 Obesity, unspecified: Secondary | ICD-10-CM | POA: Diagnosis not present

## 2023-06-03 DIAGNOSIS — D649 Anemia, unspecified: Secondary | ICD-10-CM

## 2023-06-03 DIAGNOSIS — I130 Hypertensive heart and chronic kidney disease with heart failure and stage 1 through stage 4 chronic kidney disease, or unspecified chronic kidney disease: Secondary | ICD-10-CM | POA: Diagnosis not present

## 2023-06-03 DIAGNOSIS — L97811 Non-pressure chronic ulcer of other part of right lower leg limited to breakdown of skin: Secondary | ICD-10-CM | POA: Diagnosis not present

## 2023-06-03 DIAGNOSIS — D500 Iron deficiency anemia secondary to blood loss (chronic): Secondary | ICD-10-CM | POA: Diagnosis not present

## 2023-06-03 DIAGNOSIS — I739 Peripheral vascular disease, unspecified: Secondary | ICD-10-CM | POA: Diagnosis not present

## 2023-06-03 DIAGNOSIS — R32 Unspecified urinary incontinence: Secondary | ICD-10-CM | POA: Diagnosis not present

## 2023-06-03 DIAGNOSIS — E785 Hyperlipidemia, unspecified: Secondary | ICD-10-CM | POA: Diagnosis not present

## 2023-06-03 LAB — CBC WITH DIFFERENTIAL (CANCER CENTER ONLY)
Abs Immature Granulocytes: 0.03 10*3/uL (ref 0.00–0.07)
Basophils Absolute: 0 10*3/uL (ref 0.0–0.1)
Basophils Relative: 1 %
Eosinophils Absolute: 0.2 10*3/uL (ref 0.0–0.5)
Eosinophils Relative: 4 %
HCT: 23.3 % — ABNORMAL LOW (ref 39.0–52.0)
Hemoglobin: 7.2 g/dL — ABNORMAL LOW (ref 13.0–17.0)
Immature Granulocytes: 1 %
Lymphocytes Relative: 15 %
Lymphs Abs: 0.7 10*3/uL (ref 0.7–4.0)
MCH: 33.5 pg (ref 26.0–34.0)
MCHC: 30.9 g/dL (ref 30.0–36.0)
MCV: 108.4 fL — ABNORMAL HIGH (ref 80.0–100.0)
Monocytes Absolute: 0.6 10*3/uL (ref 0.1–1.0)
Monocytes Relative: 13 %
Neutro Abs: 3.4 10*3/uL (ref 1.7–7.7)
Neutrophils Relative %: 66 %
Platelet Count: 183 10*3/uL (ref 150–400)
RBC: 2.15 MIL/uL — ABNORMAL LOW (ref 4.22–5.81)
RDW: 26.6 % — ABNORMAL HIGH (ref 11.5–15.5)
WBC Count: 5.1 10*3/uL (ref 4.0–10.5)
nRBC: 0 % (ref 0.0–0.2)

## 2023-06-03 LAB — FERRITIN: Ferritin: 102 ng/mL (ref 24–336)

## 2023-06-03 LAB — SAMPLE TO BLOOD BANK

## 2023-06-03 MED ORDER — DARBEPOETIN ALFA 300 MCG/0.6ML IJ SOSY
300.0000 ug | PREFILLED_SYRINGE | Freq: Once | INTRAMUSCULAR | Status: AC
  Administered 2023-06-03: 300 ug via SUBCUTANEOUS
  Filled 2023-06-03: qty 0.6

## 2023-06-07 ENCOUNTER — Telehealth: Payer: Self-pay | Admitting: Cardiology

## 2023-06-07 DIAGNOSIS — M545 Low back pain, unspecified: Secondary | ICD-10-CM | POA: Diagnosis not present

## 2023-06-07 DIAGNOSIS — I739 Peripheral vascular disease, unspecified: Secondary | ICD-10-CM | POA: Diagnosis not present

## 2023-06-07 DIAGNOSIS — I350 Nonrheumatic aortic (valve) stenosis: Secondary | ICD-10-CM | POA: Diagnosis not present

## 2023-06-07 DIAGNOSIS — I4821 Permanent atrial fibrillation: Secondary | ICD-10-CM | POA: Diagnosis not present

## 2023-06-07 DIAGNOSIS — I130 Hypertensive heart and chronic kidney disease with heart failure and stage 1 through stage 4 chronic kidney disease, or unspecified chronic kidney disease: Secondary | ICD-10-CM | POA: Diagnosis not present

## 2023-06-07 DIAGNOSIS — D469 Myelodysplastic syndrome, unspecified: Secondary | ICD-10-CM | POA: Diagnosis not present

## 2023-06-07 DIAGNOSIS — I2510 Atherosclerotic heart disease of native coronary artery without angina pectoris: Secondary | ICD-10-CM | POA: Diagnosis not present

## 2023-06-07 DIAGNOSIS — G8929 Other chronic pain: Secondary | ICD-10-CM | POA: Diagnosis not present

## 2023-06-07 DIAGNOSIS — R32 Unspecified urinary incontinence: Secondary | ICD-10-CM | POA: Diagnosis not present

## 2023-06-07 DIAGNOSIS — G629 Polyneuropathy, unspecified: Secondary | ICD-10-CM | POA: Diagnosis not present

## 2023-06-07 DIAGNOSIS — E785 Hyperlipidemia, unspecified: Secondary | ICD-10-CM | POA: Diagnosis not present

## 2023-06-07 DIAGNOSIS — D500 Iron deficiency anemia secondary to blood loss (chronic): Secondary | ICD-10-CM | POA: Diagnosis not present

## 2023-06-07 DIAGNOSIS — L97811 Non-pressure chronic ulcer of other part of right lower leg limited to breakdown of skin: Secondary | ICD-10-CM | POA: Diagnosis not present

## 2023-06-07 DIAGNOSIS — Z6824 Body mass index (BMI) 24.0-24.9, adult: Secondary | ICD-10-CM | POA: Diagnosis not present

## 2023-06-07 DIAGNOSIS — Z556 Problems related to health literacy: Secondary | ICD-10-CM | POA: Diagnosis not present

## 2023-06-07 DIAGNOSIS — Z79899 Other long term (current) drug therapy: Secondary | ICD-10-CM

## 2023-06-07 DIAGNOSIS — N189 Chronic kidney disease, unspecified: Secondary | ICD-10-CM | POA: Diagnosis not present

## 2023-06-07 DIAGNOSIS — I4892 Unspecified atrial flutter: Secondary | ICD-10-CM | POA: Diagnosis not present

## 2023-06-07 DIAGNOSIS — I255 Ischemic cardiomyopathy: Secondary | ICD-10-CM | POA: Diagnosis not present

## 2023-06-07 DIAGNOSIS — D539 Nutritional anemia, unspecified: Secondary | ICD-10-CM | POA: Diagnosis not present

## 2023-06-07 DIAGNOSIS — E669 Obesity, unspecified: Secondary | ICD-10-CM | POA: Diagnosis not present

## 2023-06-07 DIAGNOSIS — I5032 Chronic diastolic (congestive) heart failure: Secondary | ICD-10-CM | POA: Diagnosis not present

## 2023-06-07 DIAGNOSIS — D6869 Other thrombophilia: Secondary | ICD-10-CM | POA: Diagnosis not present

## 2023-06-07 DIAGNOSIS — I272 Pulmonary hypertension, unspecified: Secondary | ICD-10-CM | POA: Diagnosis not present

## 2023-06-07 DIAGNOSIS — I872 Venous insufficiency (chronic) (peripheral): Secondary | ICD-10-CM | POA: Diagnosis not present

## 2023-06-07 NOTE — Telephone Encounter (Signed)
Spoke with [facility] Tabitha. She state that pt has gained 5 lbs in the last week. She states that pt denies SOB and has clear lung sounds. He is currently taking Torsemide 40 mg in the AM and 20 mg in the evening. [facility] Tabitha reports that weight today was 196 lbs, 9/30 194 lbs, and 9/26 191 lbs. Current BP is 100/54 Hr 90. Pt has swelling to Abdomen, thighs and legs. Please advise.

## 2023-06-07 NOTE — Telephone Encounter (Signed)
Pt c/o swelling/edema: STAT if pt has developed SOB within 24 hours  If swelling, where is the swelling located?   Legs, thighs, abdomen  How much weight have you gained and in what time span?   5 lbs since last Friday  Have you gained 2 pounds in a day or 5 pounds in a week?   Yes  Do you have a log of your daily weights (if so, list)?   Yes  Are you currently taking a fluid pill?   Yes  Are you currently SOB?   Caller stated no more than normal and his lung sounds were clear  Have you traveled recently in a car or plane for an extended period of time?   Caller Wyatt Mage) stated patient's fluid pill was increased at his last visit.  Caller wants call back to patient's wife [phone]) regarding know next steps.

## 2023-06-07 NOTE — Telephone Encounter (Signed)
   Please have him increase Torsemide to 40mg  BID over the weekend. Call with weights next Monday or Tuesday. Would recommend a BMET and BNP next week to reassess kidney function and electrolytes along with fluid level as it does not appear this was obtained at [facility].   Thanks,  [name], PA-C 06/07/2023, 4:13 PM

## 2023-06-07 NOTE — Telephone Encounter (Signed)
[facility] Tabitha notified of medication changes and the need for a BNP and BMET. Tabitha voiced understanding.

## 2023-06-10 ENCOUNTER — Telehealth: Payer: Self-pay | Admitting: Cardiology

## 2023-06-10 DIAGNOSIS — I272 Pulmonary hypertension, unspecified: Secondary | ICD-10-CM | POA: Diagnosis not present

## 2023-06-10 DIAGNOSIS — I739 Peripheral vascular disease, unspecified: Secondary | ICD-10-CM | POA: Diagnosis not present

## 2023-06-10 DIAGNOSIS — Z556 Problems related to health literacy: Secondary | ICD-10-CM | POA: Diagnosis not present

## 2023-06-10 DIAGNOSIS — L97811 Non-pressure chronic ulcer of other part of right lower leg limited to breakdown of skin: Secondary | ICD-10-CM | POA: Diagnosis not present

## 2023-06-10 DIAGNOSIS — G629 Polyneuropathy, unspecified: Secondary | ICD-10-CM | POA: Diagnosis not present

## 2023-06-10 DIAGNOSIS — I2510 Atherosclerotic heart disease of native coronary artery without angina pectoris: Secondary | ICD-10-CM | POA: Diagnosis not present

## 2023-06-10 DIAGNOSIS — I130 Hypertensive heart and chronic kidney disease with heart failure and stage 1 through stage 4 chronic kidney disease, or unspecified chronic kidney disease: Secondary | ICD-10-CM | POA: Diagnosis not present

## 2023-06-10 DIAGNOSIS — I872 Venous insufficiency (chronic) (peripheral): Secondary | ICD-10-CM | POA: Diagnosis not present

## 2023-06-10 DIAGNOSIS — E785 Hyperlipidemia, unspecified: Secondary | ICD-10-CM | POA: Diagnosis not present

## 2023-06-10 DIAGNOSIS — D500 Iron deficiency anemia secondary to blood loss (chronic): Secondary | ICD-10-CM | POA: Diagnosis not present

## 2023-06-10 DIAGNOSIS — M545 Low back pain, unspecified: Secondary | ICD-10-CM | POA: Diagnosis not present

## 2023-06-10 DIAGNOSIS — G8929 Other chronic pain: Secondary | ICD-10-CM | POA: Diagnosis not present

## 2023-06-10 DIAGNOSIS — D469 Myelodysplastic syndrome, unspecified: Secondary | ICD-10-CM | POA: Diagnosis not present

## 2023-06-10 DIAGNOSIS — Z6824 Body mass index (BMI) 24.0-24.9, adult: Secondary | ICD-10-CM | POA: Diagnosis not present

## 2023-06-10 DIAGNOSIS — I4892 Unspecified atrial flutter: Secondary | ICD-10-CM | POA: Diagnosis not present

## 2023-06-10 DIAGNOSIS — I4821 Permanent atrial fibrillation: Secondary | ICD-10-CM | POA: Diagnosis not present

## 2023-06-10 DIAGNOSIS — I5032 Chronic diastolic (congestive) heart failure: Secondary | ICD-10-CM | POA: Diagnosis not present

## 2023-06-10 DIAGNOSIS — R32 Unspecified urinary incontinence: Secondary | ICD-10-CM | POA: Diagnosis not present

## 2023-06-10 DIAGNOSIS — D539 Nutritional anemia, unspecified: Secondary | ICD-10-CM | POA: Diagnosis not present

## 2023-06-10 DIAGNOSIS — N189 Chronic kidney disease, unspecified: Secondary | ICD-10-CM | POA: Diagnosis not present

## 2023-06-10 DIAGNOSIS — D6869 Other thrombophilia: Secondary | ICD-10-CM | POA: Diagnosis not present

## 2023-06-10 DIAGNOSIS — I350 Nonrheumatic aortic (valve) stenosis: Secondary | ICD-10-CM | POA: Diagnosis not present

## 2023-06-10 DIAGNOSIS — E669 Obesity, unspecified: Secondary | ICD-10-CM | POA: Diagnosis not present

## 2023-06-10 DIAGNOSIS — I255 Ischemic cardiomyopathy: Secondary | ICD-10-CM | POA: Diagnosis not present

## 2023-06-10 NOTE — Telephone Encounter (Signed)
Called to report his weight being 196, over the weekend it did go up to 200 but it came back down 196lbs. Please advise

## 2023-06-11 NOTE — Telephone Encounter (Signed)
Spoke with [facility] Tabitha and informed of Leonard Cisneros's note. [facility] Tabitha voiced understanding and states that she plans to obtain labs on her next visit on Friday.

## 2023-06-13 ENCOUNTER — Encounter: Payer: Self-pay | Admitting: Family Medicine

## 2023-06-13 ENCOUNTER — Ambulatory Visit (INDEPENDENT_AMBULATORY_CARE_PROVIDER_SITE_OTHER): Payer: Medicare HMO | Admitting: Family Medicine

## 2023-06-13 ENCOUNTER — Ambulatory Visit (HOSPITAL_COMMUNITY)
Admission: RE | Admit: 2023-06-13 | Discharge: 2023-06-13 | Disposition: A | Discharge status: Home / Self Care | Payer: Medicare HMO | Source: Ambulatory Visit | Attending: Family Medicine | Admitting: Family Medicine

## 2023-06-13 ENCOUNTER — Encounter: Payer: Self-pay | Admitting: Cardiology

## 2023-06-13 VITALS — BP 106/67 | HR 100 | Ht 71.0 in | Wt 206.1 lb

## 2023-06-13 DIAGNOSIS — I4821 Permanent atrial fibrillation: Secondary | ICD-10-CM | POA: Diagnosis not present

## 2023-06-13 DIAGNOSIS — D469 Myelodysplastic syndrome, unspecified: Secondary | ICD-10-CM | POA: Diagnosis not present

## 2023-06-13 DIAGNOSIS — G629 Polyneuropathy, unspecified: Secondary | ICD-10-CM | POA: Diagnosis not present

## 2023-06-13 DIAGNOSIS — D500 Iron deficiency anemia secondary to blood loss (chronic): Secondary | ICD-10-CM | POA: Diagnosis not present

## 2023-06-13 DIAGNOSIS — R32 Unspecified urinary incontinence: Secondary | ICD-10-CM | POA: Diagnosis not present

## 2023-06-13 DIAGNOSIS — L97811 Non-pressure chronic ulcer of other part of right lower leg limited to breakdown of skin: Secondary | ICD-10-CM | POA: Diagnosis not present

## 2023-06-13 DIAGNOSIS — M545 Low back pain, unspecified: Secondary | ICD-10-CM | POA: Diagnosis not present

## 2023-06-13 DIAGNOSIS — Z556 Problems related to health literacy: Secondary | ICD-10-CM | POA: Diagnosis not present

## 2023-06-13 DIAGNOSIS — I2510 Atherosclerotic heart disease of native coronary artery without angina pectoris: Secondary | ICD-10-CM | POA: Diagnosis not present

## 2023-06-13 DIAGNOSIS — R609 Edema, unspecified: Secondary | ICD-10-CM | POA: Diagnosis not present

## 2023-06-13 DIAGNOSIS — I5033 Acute on chronic diastolic (congestive) heart failure: Secondary | ICD-10-CM | POA: Diagnosis not present

## 2023-06-13 DIAGNOSIS — E669 Obesity, unspecified: Secondary | ICD-10-CM | POA: Diagnosis not present

## 2023-06-13 DIAGNOSIS — I5021 Acute systolic (congestive) heart failure: Secondary | ICD-10-CM | POA: Diagnosis not present

## 2023-06-13 DIAGNOSIS — N433 Hydrocele, unspecified: Secondary | ICD-10-CM | POA: Insufficient documentation

## 2023-06-13 DIAGNOSIS — N189 Chronic kidney disease, unspecified: Secondary | ICD-10-CM | POA: Diagnosis not present

## 2023-06-13 DIAGNOSIS — I350 Nonrheumatic aortic (valve) stenosis: Secondary | ICD-10-CM | POA: Diagnosis not present

## 2023-06-13 DIAGNOSIS — I739 Peripheral vascular disease, unspecified: Secondary | ICD-10-CM | POA: Diagnosis not present

## 2023-06-13 DIAGNOSIS — I5032 Chronic diastolic (congestive) heart failure: Secondary | ICD-10-CM | POA: Diagnosis not present

## 2023-06-13 DIAGNOSIS — Z6824 Body mass index (BMI) 24.0-24.9, adult: Secondary | ICD-10-CM | POA: Diagnosis not present

## 2023-06-13 DIAGNOSIS — E785 Hyperlipidemia, unspecified: Secondary | ICD-10-CM | POA: Diagnosis not present

## 2023-06-13 DIAGNOSIS — D539 Nutritional anemia, unspecified: Secondary | ICD-10-CM | POA: Diagnosis not present

## 2023-06-13 DIAGNOSIS — I872 Venous insufficiency (chronic) (peripheral): Secondary | ICD-10-CM | POA: Diagnosis not present

## 2023-06-13 DIAGNOSIS — I130 Hypertensive heart and chronic kidney disease with heart failure and stage 1 through stage 4 chronic kidney disease, or unspecified chronic kidney disease: Secondary | ICD-10-CM | POA: Diagnosis not present

## 2023-06-13 DIAGNOSIS — I4892 Unspecified atrial flutter: Secondary | ICD-10-CM | POA: Diagnosis not present

## 2023-06-13 DIAGNOSIS — I255 Ischemic cardiomyopathy: Secondary | ICD-10-CM | POA: Diagnosis not present

## 2023-06-13 DIAGNOSIS — I272 Pulmonary hypertension, unspecified: Secondary | ICD-10-CM | POA: Diagnosis not present

## 2023-06-13 DIAGNOSIS — G8929 Other chronic pain: Secondary | ICD-10-CM | POA: Diagnosis not present

## 2023-06-13 DIAGNOSIS — D6869 Other thrombophilia: Secondary | ICD-10-CM | POA: Diagnosis not present

## 2023-06-13 NOTE — Assessment & Plan Note (Addendum)
A stat Doppler ultrasound of the scrotum will be obtained for further evaluation of the swelling. A referral has been placed to urology for collaborative care.

## 2023-06-13 NOTE — Patient Instructions (Addendum)
I appreciate the opportunity to provide care to you today!    Follow up:  2 weeks  Labs: please stop by the lab today to get your blood drawn BMP,BNP, and CBC)  Please schedule Medicare Annual Wellness   Hydrocele: An ultrasound of the scrotum has been scheduled for further evaluation of the swelling in the scrotum.  Please stop by [facility] to get a chest X-ray. I recommend taking torsemide 40 mg twice daily to help decrease fluid retention in the body. Additionally, please be mindful of your sodium intake.  It's important to follow up with cardiology as soon as possible, informing them that you have gained a total of 11 pounds since 05/15/2023     Please continue to a heart-healthy diet and increase your physical activities. Try to exercise for at least five days a week.    It was a pleasure to see you and I look forward to continuing to work together on your health and well-being. Please do not hesitate to call the office if you need care or have questions about your care.  In case of emergency, please visit the Emergency Department for urgent care, or contact our clinic at [phone] to schedule an appointment. We're here to help you!   Have a wonderful day and week. With Gratitude, [name] MSN, FNP-BC

## 2023-06-13 NOTE — Assessment & Plan Note (Signed)
A chest X-ray will be obtained to assess for cardiac abnormalities, given the patient's increased weight gain. It is recommended that the patient take torsemide 40 mg twice daily to help reduce fluid retention. The patient was advised to be mindful of sodium intake.  The patient is encouraged to follow up with cardiology as soon as possible, given the weight gain of 11 pounds since 05/15/2023.   Wt Readings from Last 3 Encounters:  06/13/23 206 lb 1.3 oz (93.5 kg)  05/24/23 195 lb (88.5 kg)  05/20/23 191 lb 12.8 oz (87 kg)

## 2023-06-13 NOTE — Progress Notes (Signed)
Established Patient Office Visit  Subjective:  Patient ID: Leonard Cisneros, male    DOB: 1941/03/21  Age: 82 y.o. MRN: [id number]  CC:  Chief Complaint  Patient presents with   Care Management    Follow up, pt reports difficulty walking.     HPI Leonard Cisneros is a 82 y.o. male with presents with c/o difficulty walking.  Hydrocele (Bilateral Testes): The patient reports pain with swelling of the testicles that started 2 weeks ago, which has been impairing his ability to walk. He reports normal urination. No fever, chills, nausea, vomiting, redness, or warmth noted.  Acute on Chronic Diastolic Congestive Heart Failure: The patient has gained 10 pounds since 05/24/2023. I reviewed the message sent to cardiology, and he was instructed to start taking torsemide 40 mg daily with scheduled labs for BMP and BNP. The patient has not yet completed the labs. He is unsure if he has been taking torsemide 40 mg twice daily as his wife manages his medication regimen. He denies chest pain, palpitations, increased shortness of breath, orthopnea, and fatigue.     Past Medical History:  Diagnosis Date   Aortic stenosis    Atrial flutter (HCC)    a. s/p remote ablation by Dr. Ladona Ridgel.   CAD (coronary artery disease)    a. s/p CABG 2003. b. 01/2016: unstable angina - s/p BMS to SVG-ramus intermediate, patent LIMA-mLAD, patent RIMA-dRCA.    CHF (congestive heart failure) (HCC)    Chronic lower back pain    Essential hypertension    Hyperlipidemia    Ischemic cardiomyopathy    a. Cath 01/2016 -  EF 50-55% with mild distal inferior hypocontractility.   Peripheral neuropathy 04/04/2014   Permanent atrial fibrillation [facility])     Past Surgical History:  Procedure Laterality Date   BACK SURGERY     BIOPSY  11/21/2022   Procedure: BIOPSY;  Surgeon: Napoleon Form, MD;  Location: [facility] ENDOSCOPY;  Service: Gastroenterology;;   BIOPSY  02/15/2023   Procedure: BIOPSY;  Surgeon: Lanelle Bal, DO;   Location: [facility] ENDO SUITE;  Service: Endoscopy;;   CARDIAC CATHETERIZATION  ~ 2000; 2003   CARDIAC CATHETERIZATION N/A 02/01/2016   Procedure: Left Heart Cath and Cors/Grafts Angiography;  Surgeon: Lennette Bihari, MD;  Location: [facility] INVASIVE CV LAB;  Service: Cardiovascular;  Laterality: N/A;   CARDIAC CATHETERIZATION N/A 02/01/2016   Procedure: Coronary Stent Intervention;  Surgeon: Lennette Bihari, MD;  Location: [facility] INVASIVE CV LAB;  Service: Cardiovascular;  Laterality: N/A;   CATARACT EXTRACTION W/PHACO Left 02/09/2019   Procedure: CATARACT EXTRACTION PHACO AND INTRAOCULAR LENS PLACEMENT (IOC);  Surgeon: Fabio Pierce, MD;  Location: [facility] ORS;  Service: Ophthalmology;  Laterality: Left;  CDE: 27.70   CATARACT EXTRACTION W/PHACO Right 02/23/2019   Procedure: CATARACT EXTRACTION PHACO AND INTRAOCULAR LENS PLACEMENT RIGHT EYE;  Surgeon: Fabio Pierce, MD;  Location: [facility] ORS;  Service: Ophthalmology;  Laterality: Right;  CDE: 10.15   COLONOSCOPY  06/04/2002   Normal colon and rectum   COLONOSCOPY  07/08/2012   Procedure: COLONOSCOPY;  Surgeon: Corbin Ade, MD;  Location: [facility] ENDO SUITE;  Service: Endoscopy;  Laterality: N/A;  11:30   COLONOSCOPY N/A 10/02/2017   Procedure: COLONOSCOPY;  Surgeon: Corbin Ade, MD;  Location: [facility] ENDO SUITE;  Service: Endoscopy;  Laterality: N/A;  10:30am   COLONOSCOPY WITH PROPOFOL N/A 03/13/2023   Procedure: COLONOSCOPY WITH PROPOFOL;  Surgeon: Lanelle Bal, DO;  Location: [facility] ENDO SUITE;  Service: Endoscopy;  Laterality: N/A;  1215pm, asa 3   CORONARY ANGIOPLASTY WITH STENT PLACEMENT  02/01/2016   CORONARY ARTERY BYPASS GRAFT  2003   LIMA to LAD,LIMA to distal RCA,SVG to ramus intermediate vessel.   ESOPHAGOGASTRODUODENOSCOPY (EGD) WITH PROPOFOL N/A 11/21/2022   Procedure: ESOPHAGOGASTRODUODENOSCOPY (EGD) WITH PROPOFOL;  Surgeon: Napoleon Form, MD;  Location: [facility] ENDOSCOPY;  Service: Gastroenterology;  Laterality: N/A;   ESOPHAGOGASTRODUODENOSCOPY (EGD) WITH  PROPOFOL N/A 12/27/2022   Procedure: ESOPHAGOGASTRODUODENOSCOPY (EGD) WITH PROPOFOL;  Surgeon: Lanelle Bal, DO;  Location: [facility] ENDO SUITE;  Service: Endoscopy;  Laterality: N/A;   ESOPHAGOGASTRODUODENOSCOPY (EGD) WITH PROPOFOL N/A 02/15/2023   Procedure: ESOPHAGOGASTRODUODENOSCOPY (EGD) WITH PROPOFOL;  Surgeon: Lanelle Bal, DO;  Location: [facility] ENDO SUITE;  Service: Endoscopy;  Laterality: N/A;   FEMORAL REVISION Right 03/30/2019   Procedure: Femoral revision right total hip arthroplasty;  Surgeon: Ollen Gross, MD;  Location: [facility] ORS;  Service: Orthopedics;  Laterality: Right;    FOREIGN BODY REMOVAL Right 04/02/2014   Procedure: FOREIGN BODY REMOVAL RIGHT FOOT;  Surgeon: Dalia Heading, MD;  Location: [facility] ORS;  Service: General;  Laterality: Right;   JOINT REPLACEMENT     JOINT REPLACEMENT     LUMBAR DISC SURGERY     POLYPECTOMY  03/13/2023   Procedure: POLYPECTOMY;  Surgeon: Lanelle Bal, DO;  Location: [facility] ENDO SUITE;  Service: Endoscopy;;   REVISION TOTAL HIP ARTHROPLASTY Bilateral 2005-2012   right-left   RIGHT/LEFT HEART CATH AND CORONARY/GRAFT ANGIOGRAPHY N/A 09/27/2022   Procedure: RIGHT/LEFT HEART CATH AND CORONARY/GRAFT ANGIOGRAPHY;  Surgeon: [place], Peter M, MD;  Location: [facility] INVASIVE CV LAB;  Service: Cardiovascular;  Laterality: N/A;   SHOULDER OPEN ROTATOR CUFF REPAIR Right    TOTAL HIP ARTHROPLASTY Right 1999   TOTAL HIP ARTHROPLASTY Left 2008   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    Family History  Problem Relation Age of Onset   Heart attack Brother    Colon cancer Neg Hx     Social History   Socioeconomic History   Marital status: Married    Spouse name: Larita Fife   Number of children: 2   Years of education: Not on file   Highest education level: Bachelor's  degree (e.g., BA, AB, BS)  Occupational History   Occupation: PT Holiday representative: RETIRED  Tobacco Use   Smoking status: Former    Current packs/day: 0.00    Average packs/day: 0.5 packs/day for 2.0 years (1.0 ttl pk-yrs)    Types: Cigarettes    Start date: 09/03/1964    Quit date: 09/03/1966    Years since quitting: 56.8   Smokeless tobacco: Never  Vaping Use   Vaping status: Never Used  Substance and Sexual Activity   Alcohol use: Yes    Alcohol/week: 11.0 standard drinks of alcohol    Types: 7 Glasses of wine, 4 Cans of beer per week    Comment: a glass of wine with dinner/ beer on the weekend   Drug use: No   Sexual activity: Not on file  Other Topics Concern   Not on file  Social History Narrative   Lives with his wife.   Social Determinants of Health   Financial Resource Strain: Low Risk  (04/03/2023)   Overall Financial Resource Strain (CARDIA)    Difficulty of Paying Living Expenses:  Not very hard  Food Insecurity: No Food Insecurity (04/03/2023)   Hunger Vital Sign    Worried About Running Out of Food in the Last Year: Never true    Ran Out of Food in the Last Year: Never true  Transportation Needs: No Transportation Needs (04/03/2023)   PRAPARE - Administrator, Civil Service (Medical): No    Lack of Transportation (Non-Medical): No  Physical Activity: Unknown (11/25/2022)   Exercise Vital Sign    Days of Exercise per Week: 0 days    Minutes of Exercise per Session: Not on file  Stress: No Stress Concern Present (01/04/2023)   [employer] of Occupational Health - Occupational Stress Questionnaire    Feeling of Stress : Not at all  Social Connections: Moderately Integrated (11/25/2022)   Social Connection and Isolation Panel [NHANES]    Frequency of Communication with Friends and Family: Once a week    Frequency of Social Gatherings with Friends and Family: Once a week    Attends Religious Services: More than 4 times per year     Active Member of [employer] or Organizations: Yes    Attends Engineer, structural: More than 4 times per year    Marital Status: Married  Catering manager Violence: Not At Risk (04/05/2023)   Humiliation, Afraid, Rape, and Kick questionnaire    Fear of Current or Ex-Partner: No    Emotionally Abused: No    Physically Abused: No    Sexually Abused: No    Outpatient Medications Prior to Visit  Medication Sig Dispense Refill   acetaminophen (TYLENOL) 325 MG tablet Take 2 tablets (650 mg total) by mouth every 6 (six) hours as needed for mild pain (or Fever >/= 101). 100 tablet 0   atorvastatin (LIPITOR) 80 MG tablet TAKE 1 TABLET BY MOUTH EVERY DAY (Patient taking differently: Take 80 mg by mouth daily.) 90 tablet 1   dapagliflozin propanediol (FARXIGA) 10 MG TABS tablet Take 10 mg by mouth daily.     Iron, Ferrous Sulfate, 325 (65 Fe) MG TABS Take 325 mg by mouth daily. 30 tablet 2   nitroGLYCERIN (NITROSTAT) 0.4 MG SL tablet Place 1 tablet (0.4 mg total) under the tongue every 5 (five) minutes x 3 doses as needed for chest pain (if no relief after 3rd dose, proceed to the ED for an evaluation or call 911). 25 tablet 3   pantoprazole (PROTONIX) 40 MG tablet TAKE 1 TABLET BY MOUTH TWICE A DAY 180 tablet 0   Potassium Chloride ER 20 MEQ TBCR TAKE 1 TABLET BY MOUTH EVERY DAY 90 tablet 3   silver sulfADIAZINE (SILVADENE) 1 % cream Apply 1 Application topically daily. 50 g 0   spironolactone (ALDACTONE) 25 MG tablet Take 0.5 tablets (12.5 mg total) by mouth daily. 15 tablet 6   torsemide (DEMADEX) 20 MG tablet Take 3 tablets (60 mg total) by mouth daily. 270 tablet 3   metoprolol succinate (TOPROL-XL) 25 MG 24 hr tablet Take 1 tablet (25 mg total) by mouth daily. 30 tablet 0   No facility-administered medications prior to visit.    Allergies  Allergen Reactions   Augmentin [Amoxicillin-Pot Clavulanate] Nausea And Vomiting and Other (See Comments)    Has patient had a PCN reaction causing  immediate rash, facial/tongue/throat swelling, SOB or lightheadedness with hypotension: Unknown Has patient had a PCN reaction causing severe rash involving mucus membranes or skin necrosis: No Has patient had a PCN reaction that required hospitalization: No Has patient had  a PCN reaction occurring within the last 10 years: No If all of the above answers are "NO", then may proceed with Cephalosporin use.     ROS Review of Systems  Constitutional:  Negative for fatigue and fever.  Eyes:  Negative for visual disturbance.  Respiratory:  Negative for chest tightness and shortness of breath.   Cardiovascular:  Negative for chest pain and palpitations.  Genitourinary:  Positive for scrotal swelling. Negative for penile pain and testicular pain.  Neurological:  Negative for dizziness and headaches.      Objective:    Physical Exam HENT:     Head: Normocephalic.     Right Ear: External ear normal.     Left Ear: External ear normal.     Nose: No congestion or rhinorrhea.     Mouth/Throat:     Mouth: Mucous membranes are moist.  Cardiovascular:     Rate and Rhythm: Regular rhythm.     Heart sounds: No murmur heard. Pulmonary:     Effort: No respiratory distress.     Breath sounds: Normal breath sounds.  Genitourinary:    Testes:        Right: Swelling and testicular hydrocele present. Mass, tenderness or varicocele not present.        Left: Swelling and testicular hydrocele present. Mass, tenderness or varicocele not present.  Neurological:     Mental Status: He is alert.     BP 106/67   Pulse 100   Ht 5\' 11"  (1.803 m)   Wt 206 lb 1.3 oz (93.5 kg)   SpO2 91%   BMI 28.74 kg/m  Wt Readings from Last 3 Encounters:  06/13/23 206 lb 1.3 oz (93.5 kg)  05/24/23 195 lb (88.5 kg)  05/20/23 191 lb 12.8 oz (87 kg)    Lab Results  Component Value Date   TSH 2.980 02/12/2023   Lab Results  Component Value Date   WBC 5.1 06/03/2023   HGB 7.2 (L) 06/03/2023   HCT 23.3 (L)  06/03/2023   MCV 108.4 (H) 06/03/2023   PLT 183 06/03/2023   Lab Results  Component Value Date   NA 137 05/15/2023   K 3.6 05/15/2023   CO2 25 05/15/2023   GLUCOSE 111 (H) 05/15/2023   BUN 17 05/15/2023   CREATININE 1.06 05/15/2023   BILITOT 3.5 (H) 03/31/2023   ALKPHOS 284 (H) 03/31/2023   AST 67 (H) 03/31/2023   ALT 41 03/31/2023   PROT 5.8 (L) 03/31/2023   ALBUMIN 2.9 (L) 04/08/2023   CALCIUM 8.2 (L) 05/15/2023   ANIONGAP 10 05/03/2023   EGFR 71 05/15/2023   Lab Results  Component Value Date   CHOL 110 11/12/2021   Lab Results  Component Value Date   HDL 74 11/12/2021   Lab Results  Component Value Date   LDLCALC 31 11/12/2021   Lab Results  Component Value Date   TRIG 23 11/12/2021   Lab Results  Component Value Date   CHOLHDL 1.5 11/12/2021   Lab Results  Component Value Date   HGBA1C 4.6 (L) 11/18/2022      Assessment & Plan:  Hydrocele of testis Assessment & Plan: A stat Doppler ultrasound of the scrotum will be obtained for further evaluation of the swelling. A referral has been placed to urology for collaborative care.      Orders: -     US SCROTUM W/DOPPLER -     Ambulatory referral to Urology  Acute on chronic diastolic congestive heart failure (HCC) Assessment &  Plan: A chest X-ray will be obtained to assess for cardiac abnormalities, given the patient's increased weight gain. It is recommended that the patient take torsemide 40 mg twice daily to help reduce fluid retention. The patient was advised to be mindful of sodium intake.  The patient is encouraged to follow up with cardiology as soon as possible, given the weight gain of 11 pounds since 05/15/2023.   Wt Readings from Last 3 Encounters:  06/13/23 206 lb 1.3 oz (93.5 kg)  05/24/23 195 lb (88.5 kg)  05/20/23 191 lb 12.8 oz (87 kg)     Orders: -     DG Chest 2 View -     BMP8+EGFR -     Brain natriuretic peptide -     CBC with Differential/Platelet  Note: This chart has  been completed using Engineer, civil (consulting) software, and while attempts have been made to ensure accuracy, certain words and phrases may not be transcribed as intended.    Follow-up: Return in about 2 weeks (around 06/27/2023).   [name], FNP

## 2023-06-14 ENCOUNTER — Emergency Department (HOSPITAL_COMMUNITY)
Admission: EM | Admit: 2023-06-14 | Discharge: 2023-06-15 | Disposition: A | Discharge status: Home / Self Care | Payer: Medicare HMO | Attending: Emergency Medicine | Admitting: Emergency Medicine

## 2023-06-14 ENCOUNTER — Other Ambulatory Visit: Payer: Self-pay

## 2023-06-14 ENCOUNTER — Telehealth: Payer: Self-pay | Admitting: Family Medicine

## 2023-06-14 ENCOUNTER — Other Ambulatory Visit: Payer: Self-pay | Admitting: Cardiology

## 2023-06-14 ENCOUNTER — Encounter (HOSPITAL_COMMUNITY): Payer: Self-pay | Admitting: *Deleted

## 2023-06-14 DIAGNOSIS — D649 Anemia, unspecified: Secondary | ICD-10-CM | POA: Insufficient documentation

## 2023-06-14 DIAGNOSIS — R32 Unspecified urinary incontinence: Secondary | ICD-10-CM | POA: Diagnosis not present

## 2023-06-14 DIAGNOSIS — I130 Hypertensive heart and chronic kidney disease with heart failure and stage 1 through stage 4 chronic kidney disease, or unspecified chronic kidney disease: Secondary | ICD-10-CM | POA: Diagnosis not present

## 2023-06-14 DIAGNOSIS — G8929 Other chronic pain: Secondary | ICD-10-CM | POA: Diagnosis not present

## 2023-06-14 DIAGNOSIS — I5032 Chronic diastolic (congestive) heart failure: Secondary | ICD-10-CM | POA: Diagnosis not present

## 2023-06-14 DIAGNOSIS — Z79899 Other long term (current) drug therapy: Secondary | ICD-10-CM | POA: Insufficient documentation

## 2023-06-14 DIAGNOSIS — Z556 Problems related to health literacy: Secondary | ICD-10-CM | POA: Diagnosis not present

## 2023-06-14 DIAGNOSIS — G629 Polyneuropathy, unspecified: Secondary | ICD-10-CM | POA: Diagnosis not present

## 2023-06-14 DIAGNOSIS — M545 Low back pain, unspecified: Secondary | ICD-10-CM | POA: Diagnosis not present

## 2023-06-14 DIAGNOSIS — I739 Peripheral vascular disease, unspecified: Secondary | ICD-10-CM | POA: Diagnosis not present

## 2023-06-14 DIAGNOSIS — I4892 Unspecified atrial flutter: Secondary | ICD-10-CM | POA: Diagnosis not present

## 2023-06-14 DIAGNOSIS — D469 Myelodysplastic syndrome, unspecified: Secondary | ICD-10-CM | POA: Diagnosis not present

## 2023-06-14 DIAGNOSIS — D6869 Other thrombophilia: Secondary | ICD-10-CM | POA: Diagnosis not present

## 2023-06-14 DIAGNOSIS — Z87891 Personal history of nicotine dependence: Secondary | ICD-10-CM | POA: Diagnosis not present

## 2023-06-14 DIAGNOSIS — D500 Iron deficiency anemia secondary to blood loss (chronic): Secondary | ICD-10-CM | POA: Diagnosis not present

## 2023-06-14 DIAGNOSIS — I110 Hypertensive heart disease with heart failure: Secondary | ICD-10-CM | POA: Insufficient documentation

## 2023-06-14 DIAGNOSIS — N189 Chronic kidney disease, unspecified: Secondary | ICD-10-CM | POA: Diagnosis not present

## 2023-06-14 DIAGNOSIS — Z6824 Body mass index (BMI) 24.0-24.9, adult: Secondary | ICD-10-CM | POA: Diagnosis not present

## 2023-06-14 DIAGNOSIS — D539 Nutritional anemia, unspecified: Secondary | ICD-10-CM | POA: Diagnosis not present

## 2023-06-14 DIAGNOSIS — Z96643 Presence of artificial hip joint, bilateral: Secondary | ICD-10-CM | POA: Diagnosis not present

## 2023-06-14 DIAGNOSIS — I255 Ischemic cardiomyopathy: Secondary | ICD-10-CM | POA: Diagnosis not present

## 2023-06-14 DIAGNOSIS — E669 Obesity, unspecified: Secondary | ICD-10-CM | POA: Diagnosis not present

## 2023-06-14 DIAGNOSIS — I872 Venous insufficiency (chronic) (peripheral): Secondary | ICD-10-CM | POA: Diagnosis not present

## 2023-06-14 DIAGNOSIS — I350 Nonrheumatic aortic (valve) stenosis: Secondary | ICD-10-CM | POA: Diagnosis not present

## 2023-06-14 DIAGNOSIS — I2510 Atherosclerotic heart disease of native coronary artery without angina pectoris: Secondary | ICD-10-CM | POA: Diagnosis not present

## 2023-06-14 DIAGNOSIS — I509 Heart failure, unspecified: Secondary | ICD-10-CM | POA: Diagnosis not present

## 2023-06-14 DIAGNOSIS — L97811 Non-pressure chronic ulcer of other part of right lower leg limited to breakdown of skin: Secondary | ICD-10-CM | POA: Diagnosis not present

## 2023-06-14 DIAGNOSIS — I272 Pulmonary hypertension, unspecified: Secondary | ICD-10-CM | POA: Diagnosis not present

## 2023-06-14 DIAGNOSIS — E785 Hyperlipidemia, unspecified: Secondary | ICD-10-CM | POA: Diagnosis not present

## 2023-06-14 DIAGNOSIS — D509 Iron deficiency anemia, unspecified: Secondary | ICD-10-CM | POA: Diagnosis not present

## 2023-06-14 DIAGNOSIS — I4821 Permanent atrial fibrillation: Secondary | ICD-10-CM | POA: Diagnosis not present

## 2023-06-14 LAB — CBC WITH DIFFERENTIAL/PLATELET
Abs Immature Granulocytes: 0.05 10*3/uL (ref 0.00–0.07)
Basophils Absolute: 0 10*3/uL (ref 0.0–0.1)
Basophils Relative: 1 %
Eosinophils Absolute: 0.2 10*3/uL (ref 0.0–0.5)
Eosinophils Relative: 3 %
HCT: 22.6 % — ABNORMAL LOW (ref 39.0–52.0)
Hemoglobin: 6.8 g/dL — CL (ref 13.0–17.0)
Immature Granulocytes: 1 %
Lymphocytes Relative: 11 %
Lymphs Abs: 0.6 10*3/uL — ABNORMAL LOW (ref 0.7–4.0)
MCH: 33.5 pg (ref 26.0–34.0)
MCHC: 30.1 g/dL (ref 30.0–36.0)
MCV: 111.3 fL — ABNORMAL HIGH (ref 80.0–100.0)
Monocytes Absolute: 0.8 10*3/uL (ref 0.1–1.0)
Monocytes Relative: 14 %
Neutro Abs: 3.9 10*3/uL (ref 1.7–7.7)
Neutrophils Relative %: 70 %
Platelets: 207 10*3/uL (ref 150–400)
RBC: 2.03 MIL/uL — ABNORMAL LOW (ref 4.22–5.81)
RDW: 25.9 % — ABNORMAL HIGH (ref 11.5–15.5)
WBC: 5.4 10*3/uL (ref 4.0–10.5)
nRBC: 0 % (ref 0.0–0.2)

## 2023-06-14 LAB — COMPREHENSIVE METABOLIC PANEL
ALT: 28 U/L (ref 0–44)
AST: 35 U/L (ref 15–41)
Albumin: 2.8 g/dL — ABNORMAL LOW (ref 3.5–5.0)
Alkaline Phosphatase: 361 U/L — ABNORMAL HIGH (ref 38–126)
Anion gap: 9 (ref 5–15)
BUN: 22 mg/dL (ref 8–23)
CO2: 27 mmol/L (ref 22–32)
Calcium: 7.9 mg/dL — ABNORMAL LOW (ref 8.9–10.3)
Chloride: 95 mmol/L — ABNORMAL LOW (ref 98–111)
Creatinine, Ser: 1.26 mg/dL — ABNORMAL HIGH (ref 0.61–1.24)
GFR, Estimated: 57 mL/min — ABNORMAL LOW (ref >60)
Glucose, Bld: 112 mg/dL — ABNORMAL HIGH (ref 70–99)
Potassium: 3.8 mmol/L (ref 3.5–5.1)
Sodium: 131 mmol/L — ABNORMAL LOW (ref 135–145)
Total Bilirubin: 2 mg/dL — ABNORMAL HIGH (ref 0.3–1.2)
Total Protein: 6.1 g/dL — ABNORMAL LOW (ref 6.5–8.1)

## 2023-06-14 LAB — PROTIME-INR
INR: 1.2 (ref 0.8–1.2)
Prothrombin Time: 15.6 s — ABNORMAL HIGH (ref 11.4–15.2)

## 2023-06-14 LAB — PREPARE RBC (CROSSMATCH)

## 2023-06-14 MED ORDER — SODIUM CHLORIDE 0.9% IV SOLUTION: Freq: Once | INTRAVENOUS | Status: DC

## 2023-06-14 NOTE — ED Notes (Signed)
Lab

## 2023-06-14 NOTE — ED Notes (Signed)
Pt given water per request.

## 2023-06-14 NOTE — ED Notes (Signed)
Spoke with Asher Muir in lab to check on obtaining blood for administration, Asher Muir says it will be a while as they are trying to find blood as pt has a lot of antibodies that were not present on last type and screen.

## 2023-06-14 NOTE — ED Notes (Signed)
Pt requesting medication for insomnia, EDP aware

## 2023-06-14 NOTE — Telephone Encounter (Signed)
I called and spoke with the patient's wife to inform her of her husband's lab results, which showed a hemoglobin level of 6.7 and an RBC count of 1.99. I advised her to take him to the emergency department for a blood transfusion if possible. It is worth noting that the patient was asymptomatic during his office visit on 06/13/2023.  I reviewed the scrotal ultrasound results with the patient's wife, and she has been informed that a referral has been placed to urology for further evaluation.

## 2023-06-14 NOTE — ED Provider Notes (Signed)
[place] EMERGENCY DEPARTMENT AT [facility] Provider Note  CSN: [id number] Arrival date & time: 06/14/23 0941  Chief Complaint(s) Fatigue  HPI Leonard Cisneros is a 82 y.o. male with past medical history as below, significant for CHF, CAD, HLD, HTN, MDS, anemia who presents to the ED with complaint of low hgb.   Patient has generalized fatigue, somewhat worsened from baseline.  No dyspnea.  He had labs drawn at PCP and was advised to come to the ER secondary to low hemoglobin.  Has had multiple blood transfusions in the past without reaction that he is aware of.  He has appointment on Monday at [facility] w/ oncology.  Denies bleeding that he is aware of, no BRBPR or melena, no hematemesis, no increased bruising.  Does have chronic lower extremity swelling, scrotal swelling with presumed hydrocele pending follow-up with urology.  No dyspnea or syncope.  Leg swelling at baseline  Past Medical History Past Medical History:  Diagnosis Date   Aortic stenosis    Atrial flutter (HCC)    a. s/p remote ablation by Dr. Ladona Ridgel.   CAD (coronary artery disease)    a. s/p CABG 2003. b. 01/2016: unstable angina - s/p BMS to SVG-ramus intermediate, patent LIMA-mLAD, patent RIMA-dRCA.    CHF (congestive heart failure) (HCC)    Chronic lower back pain    Essential hypertension    Hyperlipidemia    Ischemic cardiomyopathy    a. Cath 01/2016 -  EF 50-55% with mild distal inferior hypocontractility.   Peripheral neuropathy 04/04/2014   Permanent atrial fibrillation [facility])    Patient Active Problem List   Diagnosis Date Noted   Hydrocele of testis 06/13/2023   Coronary artery disease 04/10/2023   MDS (myelodysplastic syndrome) (HCC) 04/09/2023   Demand ischemia (HCC) 04/03/2023   Atrial fibrillation (HCC) 04/03/2023   AKI (acute kidney injury) (HCC) 04/02/2023   Acute on chronic combined systolic and diastolic CHF (congestive heart failure) (HCC) 04/02/2023   Microcytic anemia  04/02/2023   History of duodenal ulcer 04/01/2023   Anemia 04/01/2023   GIB (gastrointestinal bleeding) 03/31/2023   Acute exacerbation of CHF (congestive heart failure) (HCC) 02/14/2023   Elevated alkaline phosphatase level 02/14/2023   Symptomatic anemia 02/14/2023   Acute on chronic heart failure with preserved ejection fraction (HFpEF) (HCC) 02/14/2023   Pressure injury of skin 02/14/2023   Decubitus ulcer of buttock, right, unstageable (HCC) 02/12/2023   Fatigue 02/12/2023   Stasis dermatitis of both legs 01/11/2023   Permanent atrial fibrillation (HCC) 12/27/2022   Contraindication to anticoagulation therapy 12/27/2022   Hemorrhagic shock (HCC) 12/26/2022   ABLA (acute blood loss anemia) 12/26/2022   Gastrointestinal hemorrhage 12/26/2022   Lactic acidosis 12/26/2022   Coronary artery disease involving native coronary artery of native heart without angina pectoris 12/26/2022   Hypovolemic shock (HCC) 12/26/2022   Duodenal ulcer 11/21/2022   Gastritis and gastroduodenitis 11/21/2022   Heme positive stool 11/20/2022   Melena 11/20/2022   Anemia due to chronic blood loss 11/20/2022   History of CAD (coronary artery disease) 11/19/2022   Elevated troponin 11/19/2022   SOB (shortness of breath) 11/18/2022   Acute respiratory failure with hypoxia (HCC) 09/27/2022   Nonrheumatic aortic valve stenosis 09/27/2022   Unstable angina (HCC) 09/27/2022   Acute heart failure (HCC) 09/24/2022   Hypokalemia 09/24/2022   Hyponatremia 09/24/2022   Abnormal liver enzymes 09/24/2022   Hyperbilirubinemia 09/24/2022   Volume overload 09/24/2022   Acquired thrombophilia (HCC) 09/24/2022   Severe anemia 09/24/2022  Cellulitis 11/12/2021   ETOH abuse 11/12/2021   Ambulatory dysfunction 11/12/2021   Severe pulmonary hypertension (HCC) 11/12/2021   Moderate aortic stenosis 11/12/2021   Systolic congestive heart failure (HCC) 11/12/2021   Lumbar post-laminectomy syndrome 06/15/2021   Chronic  pain syndrome 06/15/2021   Lumbar pain 08/31/2020   Pain of left hip joint 08/03/2020   History of revision of total replacement of right hip joint 06/10/2019   Persistent atrial fibrillation (HCC) 04/09/2019   Atrial fibrillation with RVR (HCC) 04/08/2019   Acute on chronic diastolic congestive heart failure (HCC)    Macrocytic anemia    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   Chest pain 10/22/2018   Physical deconditioning 04/26/2018   Unsteady gait 04/26/2018   Closed fracture of femur, greater trochanter (HCC) 04/25/2018   Pain in lower limb 04/24/2018   Fall 04/21/2018   Peripheral vascular disease (HCC) 04/21/2018   History of adenomatous polyp of colon 08/20/2017   Ischemic myocardial dysfunction 02/02/2016   Recurrent coronary arteriosclerosis after percutaneous transluminal coronary angioplasty 02/01/2016   Exercise-induced angina (HCC) 01/28/2016   Hyperlipidemia 01/28/2016   Neuropathic pain 06/13/2015   Mild obesity 04/20/2015   Essential hypertension 04/20/2015   Disorder of peripheral nervous system 04/04/2014   Foreign body (FB) in soft tissue 03/31/2014   Disorder of glucose metabolism (HCC) 03/31/2014   Obesity with body mass index 30 or greater 03/31/2014   History of coronary artery bypass graft 01/04/2014   Tear of left rotator cuff 10/22/2013   Rotator cuff syndrome of left shoulder 10/22/2013   Long term (current) use of anticoagulants 06/18/2013   Encounter for general adult medical examination without abnormal findings 06/18/2012   High risk medication use 06/18/2012   Home Medication(s) Prior to Admission medications   Medication Sig Start Date End Date Taking? Authorizing Provider  acetaminophen (TYLENOL) 325 MG tablet Take 2 tablets (650 mg total) by mouth every 6 (six) hours as needed for mild pain (or Fever >/= 101). 02/16/23  Yes Emokpae, Courage, MD  atorvastatin (LIPITOR) 80 MG tablet TAKE 1 TABLET BY MOUTH EVERY DAY 06/14/23  Yes  [name], MD  dapagliflozin propanediol (FARXIGA) 10 MG TABS tablet Take 10 mg by mouth daily.   Yes [provider]  Iron, Ferrous Sulfate, 325 (65 Fe) MG TABS Take 325 mg by mouth daily. 02/16/23  Yes Emokpae, Courage, MD  metoprolol succinate (TOPROL-XL) 25 MG 24 hr tablet Take 1 tablet (25 mg total) by mouth daily. 04/15/23 06/14/23 Yes Arrien, York Ram, MD  pantoprazole (PROTONIX) 40 MG tablet TAKE 1 TABLET BY MOUTH TWICE A DAY 05/27/23  Yes [name], NP  Potassium Chloride ER 20 MEQ TBCR TAKE 1 TABLET BY MOUTH EVERY DAY 06/03/23  Yes [name], MD  spironolactone (ALDACTONE) 25 MG tablet Take 0.5 tablets (12.5 mg total) by mouth daily. 05/09/23  Yes [name], MD  torsemide (DEMADEX) 20 MG tablet Take 3 tablets (60 mg total) by mouth daily. Patient taking differently: Take 40 mg by mouth 2 (two) times daily. 05/24/23 08/22/23 Yes Strader, [place] M, PA-C  KLOR-CON M10 10 MEQ tablet Take 30 mEq by mouth daily. Patient not taking: Reported on 06/14/2023 05/22/23   [provider]  nitroGLYCERIN (NITROSTAT) 0.4 MG SL tablet Place 1 tablet (0.4 mg total) under the tongue every 5 (five) minutes x 3 doses as needed for chest pain (if no relief after 3rd dose, proceed to the ED for an evaluation or call 911).  Patient not taking: Reported on 06/14/2023 09/05/22   [name], MD                                                                                                                                    Past Surgical History Past Surgical History:  Procedure Laterality Date   BACK SURGERY     BIOPSY  11/21/2022   Procedure: BIOPSY;  Surgeon: Napoleon Form, MD;  Location: [facility] ENDOSCOPY;  Service: Gastroenterology;;   BIOPSY  02/15/2023   Procedure: BIOPSY;  Surgeon: Lanelle Bal, DO;  Location: [facility] ENDO SUITE;  Service: Endoscopy;;   CARDIAC CATHETERIZATION  ~ 2000; 2003   CARDIAC CATHETERIZATION N/A 02/01/2016   Procedure:  Left Heart Cath and Cors/Grafts Angiography;  Surgeon: Lennette Bihari, MD;  Location: [facility] INVASIVE CV LAB;  Service: Cardiovascular;  Laterality: N/A;   CARDIAC CATHETERIZATION N/A 02/01/2016   Procedure: Coronary Stent Intervention;  Surgeon: Lennette Bihari, MD;  Location: [facility] INVASIVE CV LAB;  Service: Cardiovascular;  Laterality: N/A;   CATARACT EXTRACTION W/PHACO Left 02/09/2019   Procedure: CATARACT EXTRACTION PHACO AND INTRAOCULAR LENS PLACEMENT (IOC);  Surgeon: Fabio Pierce, MD;  Location: [facility] ORS;  Service: Ophthalmology;  Laterality: Left;  CDE: 27.70   CATARACT EXTRACTION W/PHACO Right 02/23/2019   Procedure: CATARACT EXTRACTION PHACO AND INTRAOCULAR LENS PLACEMENT RIGHT EYE;  Surgeon: Fabio Pierce, MD;  Location: [facility] ORS;  Service: Ophthalmology;  Laterality: Right;  CDE: 10.15   COLONOSCOPY  06/04/2002   Normal colon and rectum   COLONOSCOPY  07/08/2012   Procedure: COLONOSCOPY;  Surgeon: Corbin Ade, MD;  Location: [facility] ENDO SUITE;  Service: Endoscopy;  Laterality: N/A;  11:30   COLONOSCOPY N/A 10/02/2017   Procedure: COLONOSCOPY;  Surgeon: Corbin Ade, MD;  Location: [facility] END[facility];  Service: Endoscopy;  Laterality: N/A;  10:30am   COLONOSCOPY WITH PROPOFOL N/A 03/13/2023   Procedure: COLONOSCOPY WITH PROPOFOL;  Surgeon: Lanelle Bal, DO;  Location: [facility] ENDO SUITE;  Service: Endoscopy;  Laterality: N/A;  1215pm, asa 3   CORONARY ANGIOPLASTY WITH STENT PLACEMENT  02/01/2016   CORONARY ARTERY BYPASS GRAFT  2003   LIMA to LAD,LIMA to distal RCA,SVG to ramus intermediate vessel.   ESOPHAGOGASTRODUODENOSCOPY (EGD) WITH PROPOFOL N/A 11/21/2022   Procedure: ESOPHAGOGASTRODUODENOSCOPY (EGD) WITH PROPOFOL;  Surgeon: Napoleon Form, MD;  Location: [facility] ENDOSCOPY;  Service: Gastroenterology;  Laterality: N/A;   ESOPHAGOGASTRODUODENOSCOPY (EGD) WITH PROPOFOL N/A 12/27/2022   Procedure: ESOPHAGOGASTRODUODENOSCOPY (EGD) WITH PROPOFOL;  Surgeon: Lanelle Bal, DO;  Location: [facility];   Service: Endoscopy;  Laterality: N/A;   ESOPHAGOGASTRODUODENOSCOPY (EGD) WITH PROPOFOL N/A 02/15/2023   Procedure: ESOPHAGOGASTRODUODENOSCOPY (EGD) WITH PROPOFOL;  Surgeon: Lanelle Bal, DO;  Location: [facility];  Service: Endoscopy;  Laterality: N/A;   FEMORAL REVISION Right 03/30/2019   Procedure: Femoral revision right total hip arthroplasty;  Surgeon: Ollen Gross, MD;  Location: [facility] ORS;  Service: Orthopedics;  Laterality: Right;    FOREIGN BODY REMOVAL Right 04/02/2014   Procedure: FOREIGN BODY REMOVAL RIGHT FOOT;  Surgeon: Dalia Heading, MD;  Location: [facility] ORS;  Service: General;  Laterality: Right;   JOINT REPLACEMENT     JOINT REPLACEMENT     LUMBAR DISC SURGERY     POLYPECTOMY  03/13/2023   Procedure: POLYPECTOMY;  Surgeon: Lanelle Bal, DO;  Location: [facility];  Service: Endoscopy;;   REVISION TOTAL HIP ARTHROPLASTY Bilateral 2005-2012   right-left   RIGHT/LEFT HEART CATH AND CORONARY/GRAFT ANGIOGRAPHY N/A 09/27/2022   Procedure: RIGHT/LEFT HEART CATH AND CORONARY/GRAFT ANGIOGRAPHY;  Surgeon: [place]azil[place], Peter M, MD;  Location: [facility] INVASIVE CV LAB;  Service: Cardiovascular;  Laterality: N/A;   SHOULDER OPEN ROTATOR CUFF REPAIR Right    TOTAL HIP ARTHROPLASTY Right 1999   TOTAL HIP ARTHROPLASTY Left 2008   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   Family History Family History  Problem Relation Age of Onset   Heart attack Brother    Colon cancer Neg Hx     Social History Social History   Tobacco Use   Smoking status: Former    Current packs/day: 0.00    Average packs/day: 0.5 packs/day for 2.0 years (1.0 ttl pk-yrs)    Types: Cigarettes    Start date: 09/03/1964    Quit date: 09/03/1966    Years since quitting: 56.8   Smokeless tobacco: Never  Vaping Use   Vaping  status: Never Used  Substance Use Topics   Alcohol use: Yes    Alcohol/week: 11.0 standard drinks of alcohol    Types: 7 Glasses of wine, 4 Cans of beer per week    Comment: a glass of wine with dinner/ beer on the weekend   Drug use: No   Allergies Augmentin [amoxicillin-pot clavulanate]  Review of Systems Review of Systems  Constitutional:  Positive for fatigue.  HENT:  Negative for nosebleeds.   Respiratory:  Negative for chest tightness and shortness of breath.   Cardiovascular:  Positive for leg swelling.  Gastrointestinal:  Negative for anal bleeding and blood in stool.  Genitourinary:  Negative for hematuria.  Skin:  Negative for pallor.  All other systems reviewed and are negative.   Physical Exam Vital Signs  I have reviewed the triage vital signs BP 104/61 (BP Location: Left Arm)   Pulse 91   Temp 97.9 F (36.6 C) (Oral)   Resp 18   Ht 5\' 11"  (1.803 m)   Wt 93.4 kg   SpO2 92%   BMI 28.72 kg/m  Physical Exam Vitals and nursing note reviewed.  Constitutional:      General: He is not in acute distress.    Appearance: Normal appearance. He is well-developed. He is not ill-appearing.  HENT:     Head: Normocephalic and atraumatic.     Right Ear: External ear normal.     Left Ear: External ear normal.     Nose: Nose normal.     Mouth/Throat:     Mouth: Mucous membranes are moist.  Eyes:     General: No scleral icterus.       Right eye: No discharge.        Left eye: No discharge.  Cardiovascular:     Rate and Rhythm: Normal rate and regular rhythm.  Pulses: Normal pulses.  Pulmonary:     Effort: Pulmonary effort is normal. No respiratory distress.     Breath sounds: No stridor.  Abdominal:     General: Abdomen is flat. There is no distension.     Tenderness: There is no guarding.  Musculoskeletal:        General: No deformity.     Cervical back: No rigidity.     Right lower leg: Edema present.     Left lower leg: Edema present.  Skin:     General: Skin is warm and dry.     Coloration: Skin is not cyanotic, jaundiced or pale.  Neurological:     Mental Status: He is alert and oriented to person, place, and time.     GCS: GCS eye subscore is 4. GCS verbal subscore is 5. GCS motor subscore is 6.  Psychiatric:        Speech: Speech normal.        Behavior: Behavior normal. Behavior is cooperative.     ED Results and Treatments Labs (all labs ordered are listed, but only abnormal results are displayed) Labs Reviewed  CBC WITH DIFFERENTIAL/PLATELET - Abnormal; Notable for the following components:      Result Value   RBC 2.03 (*)    Hemoglobin 6.8 (*)    HCT 22.6 (*)    MCV 111.3 (*)    RDW 25.9 (*)    Lymphs Abs 0.6 (*)    All other components within normal limits  COMPREHENSIVE METABOLIC PANEL - Abnormal; Notable for the following components:   Sodium 131 (*)    Chloride 95 (*)    Glucose, Bld 112 (*)    Creatinine, Ser 1.26 (*)    Calcium 7.9 (*)    Total Protein 6.1 (*)    Albumin 2.8 (*)    Alkaline Phosphatase 361 (*)    Total Bilirubin 2.0 (*)    GFR, Estimated 57 (*)    All other components within normal limits  PROTIME-INR - Abnormal; Notable for the following components:   Prothrombin Time 15.6 (*)    All other components within normal limits  TYPE AND SCREEN  PREPARE RBC (CROSSMATCH)                                                                                                                          Radiology No results found.  Pertinent labs & imaging results that were available during my care of the patient were reviewed by me and considered in my medical decision making (see MDM for details).  Medications Ordered in ED Medications  0.9 %  sodium chloride infusion (Manually program via Guardrails IV Fluids) (has no administration in time range)  Procedures .Critical Care  Performed by: Sloan Leiter, DO Authorized by: Sloan Leiter, DO   Critical care provider statement:    Critical care time (minutes):  30   Critical care was necessary to treat or prevent imminent or life-threatening deterioration of the following conditions:  Circulatory failure   Critical care was time spent personally by me on the following activities:  Development of treatment plan with patient or surrogate, discussions with consultants, evaluation of patient's response to treatment, examination of patient, ordering and review of laboratory studies, ordering and review of radiographic studies, ordering and performing treatments and interventions, pulse oximetry, re-evaluation of patient's condition, review of old charts and obtaining history from patient or surrogate   (including critical care time)  Medical Decision Making / ED Course    Medical Decision Making:    Leonard Cisneros is a 82 y.o. male with past medical history as below, significant for CHF, CAD, HLD, HTN, MDS, anemia who presents to the ED with complaint of low hgb. . The complaint involves an extensive differential diagnosis and also carries with it a high risk of complications and morbidity.  Serious etiology was considered. Ddx includes but is not limited to: Anemia of chronic disease, MDS, GI bleed, electrolyte derangement, vitamin deficiency, iron deficiency, etc.  Complete initial physical exam performed, notably the patient  was distress, HDS.    Reviewed and confirmed nursing documentation for past medical history, family history, social history.  Vital signs reviewed.    Clinical Course as of 06/14/23 2049  Fri Jun 14, 2023  1105 Creatinine(!): 1.26 Elevated from prior, baseline around 1.1-1.2 [SG]  1106 BUN: 22 Not elev [SG]    Clinical Course User Index [SG] Tanda Rockers A, DO     His baseline hemoglobin is around 8; hemoglobin today is 6.8.  Patient with likely  symptomatic anemia, likely to be secondary to his underlying MDS.  Multiple blood transfusion in the past.  He is agreeable to blood transfusion today.  Signed out to incoming EDP pending PRBC's, can likely be discharged home following this if tolerates well, he has appt on Monday at [facility].                  Additional history obtained: -Additional history obtained from family -External records from outside source obtained and reviewed including: Chart review including previous notes, labs, imaging, consultation notes including  Prior labs Oncology documentation Primary care documentation   Lab Tests: -I ordered, reviewed, and interpreted labs.   The pertinent results include:   Labs Reviewed  CBC WITH DIFFERENTIAL/PLATELET - Abnormal; Notable for the following components:      Result Value   RBC 2.03 (*)    Hemoglobin 6.8 (*)    HCT 22.6 (*)    MCV 111.3 (*)    RDW 25.9 (*)    Lymphs Abs 0.6 (*)    All other components within normal limits  COMPREHENSIVE METABOLIC PANEL - Abnormal; Notable for the following components:   Sodium 131 (*)    Chloride 95 (*)    Glucose, Bld 112 (*)    Creatinine, Ser 1.26 (*)    Calcium 7.9 (*)    Total Protein 6.1 (*)    Albumin 2.8 (*)    Alkaline Phosphatase 361 (*)    Total Bilirubin 2.0 (*)    GFR, Estimated 57 (*)    All other components within normal limits  PROTIME-INR - Abnormal; Notable for the following components:   Prothrombin  Time 15.6 (*)    All other components within normal limits  TYPE AND SCREEN  PREPARE RBC (CROSSMATCH)    Notable for as above hgb low  EKG   EKG Interpretation Date/Time:    Ventricular Rate:    PR Interval:    QRS Duration:    QT Interval:    QTC Calculation:   R Axis:      Text Interpretation:           Imaging Studies ordered: na   Medicines ordered and prescription drug management: Meds ordered this encounter  Medications   0.9 %  sodium chloride infusion (Manually  program via Guardrails IV Fluids)    -I have reviewed the patients home medicines and have made adjustments as needed   Consultations Obtained: na   Cardiac Monitoring: Continuous pulse oximetry interpreted by myself, 99% on RA.    Social Determinants of Health:  Diagnosis or treatment significantly limited by social determinants of health: former smoker   Reevaluation: After the interventions noted above, I reevaluated the patient and found that they have improved  Co morbidities that complicate the patient evaluation  Past Medical History:  Diagnosis Date   Aortic stenosis    Atrial flutter (HCC)    a. s/p remote ablation by Dr. Ladona Ridgel.   CAD (coronary artery disease)    a. s/p CABG 2003. b. 01/2016: unstable angina - s/p BMS to SVG-ramus intermediate, patent LIMA-mLAD, patent RIMA-dRCA.    CHF (congestive heart failure) (HCC)    Chronic lower back pain    Essential hypertension    Hyperlipidemia    Ischemic cardiomyopathy    a. Cath 01/2016 -  EF 50-55% with mild distal inferior hypocontractility.   Peripheral neuropathy 04/04/2014   Permanent atrial fibrillation (HCC)       Dispostion: Disposition decision including need for hospitalization was considered, and patient disposition pending at time of sign out.    Final Clinical Impression(s) / ED Diagnoses Final diagnoses:  Symptomatic anemia        Sloan Leiter, DO 06/14/23 2049

## 2023-06-14 NOTE — ED Triage Notes (Signed)
Pt was seen at his PCP yesterday for a checkup and they did blood work; today they called patient and told him to come to ED due to low hemoglobin. They stated hemoglobin was 6.2  Pt states he has been feeling more lethargic for the last couple of weeks  Pt has bilateral lower leg swelling to both legs  The home health nurse used Una boot to right lower leg due to weeping of skin

## 2023-06-14 NOTE — ED Notes (Signed)
Lab reports that patient results are positive for antibodies, but they are unable to determine which antibodies. Blood will need to be sent out for further testing and until there are results, they will not be able to determine which type of blood patient can safely receive. Patient and MD informed of delay.

## 2023-06-14 NOTE — Discharge Instructions (Addendum)
Please follow up on Monday at [facility] for your scheduled appointment

## 2023-06-14 NOTE — ED Notes (Signed)
F/u with Dr Deretha Emory regarding pt request for sleeping medication, no new orders received.

## 2023-06-15 LAB — BMP8+EGFR
BUN/Creatinine Ratio: 18 (ref 10–24)
BUN: 21 mg/dL (ref 8–27)
CO2: 23 mmol/L (ref 20–29)
Calcium: 8 mg/dL — ABNORMAL LOW (ref 8.6–10.2)
Chloride: 97 mmol/L (ref 96–106)
Creatinine, Ser: 1.16 mg/dL (ref 0.76–1.27)
Glucose: 98 mg/dL (ref 70–99)
Potassium: 4.3 mmol/L (ref 3.5–5.2)
Sodium: 135 mmol/L (ref 134–144)
eGFR: 63 mL/min/{1.73_m2} (ref >59)

## 2023-06-15 LAB — CBC WITH DIFFERENTIAL/PLATELET
Basophils Absolute: 0 10*3/uL (ref 0.0–0.2)
Basos: 1 %
EOS (ABSOLUTE): 0.2 10*3/uL (ref 0.0–0.4)
Eos: 3 %
Hematocrit: 21 % — ABNORMAL LOW (ref 37.5–51.0)
Hemoglobin: 6.7 g/dL — CL (ref 13.0–17.7)
Immature Grans (Abs): 0.1 10*3/uL (ref 0.0–0.1)
Immature Granulocytes: 1 %
Lymphocytes Absolute: 0.6 10*3/uL — ABNORMAL LOW (ref 0.7–3.1)
Lymphs: 11 %
MCH: 33.7 pg — ABNORMAL HIGH (ref 26.6–33.0)
MCHC: 31.9 g/dL (ref 31.5–35.7)
MCV: 106 fL — ABNORMAL HIGH (ref 79–97)
Monocytes Absolute: 0.8 10*3/uL (ref 0.1–0.9)
Monocytes: 14 %
Neutrophils Absolute: 4.2 10*3/uL (ref 1.4–7.0)
Neutrophils: 70 %
Platelets: 206 10*3/uL (ref 150–450)
RBC: 1.99 x10E6/uL — CL (ref 4.14–5.80)
RDW: 21.1 % — ABNORMAL HIGH (ref 11.6–15.4)
WBC: 5.9 10*3/uL (ref 3.4–10.8)

## 2023-06-15 LAB — BRAIN NATRIURETIC PEPTIDE: BNP: 557.9 pg/mL — ABNORMAL HIGH (ref 0.0–100.0)

## 2023-06-15 NOTE — ED Notes (Signed)
Patient given bag lunch and drink at this time

## 2023-06-15 NOTE — ED Notes (Signed)
Called lab to check on blood product update- spoke with Asher Muir, reports no updates regarding obtaining matched blood, says blood bank in [place] has not contacted her at this time.

## 2023-06-15 NOTE — ED Notes (Addendum)
This RN called lab pertaining to blood product, I spoke with Edson Snowball and she stated that [facility] called [facility] approximately 2 hours ago and informed them that they found blood and will be sending it over to us--in  which our lab will then have to cross match to ensure blood is compatible--charge RN and MD made aware of the delay of blood product administration

## 2023-06-15 NOTE — Progress Notes (Signed)
Hello Dr. Diona Browner I assessed Leonard Cisneros's labs during my office visit, and his BNP is elevated. I would appreciate it if you could see him soon, if possible. Thank you for your consideration and the excellent care provided.

## 2023-06-15 NOTE — ED Notes (Signed)
Pt tolerating blood transfusion well, denies any blood transfusion reaction symptoms, titrated from 120 to 150 / hr

## 2023-06-15 NOTE — ED Notes (Addendum)
Pt tolerated blood transfusion well. Denies any blood transfusion reactions

## 2023-06-16 NOTE — ED Provider Notes (Addendum)
    Physical Exam  BP 127/66   Pulse 97   Temp 97.8 F (36.6 C) (Oral)   Resp (!) 23   Ht 5\' 11"  (1.803 m)   Wt 93.4 kg   SpO2 92%   BMI 28.72 kg/m   Physical Exam Constitutional:      General: He is not in acute distress.    Appearance: Normal appearance.  HENT:     Head: Normocephalic and atraumatic.     Nose: No congestion or rhinorrhea.  Eyes:     General:        Right eye: No discharge.        Left eye: No discharge.     Extraocular Movements: Extraocular movements intact.     Pupils: Pupils are equal, round, and reactive to light.  Cardiovascular:     Rate and Rhythm: Normal rate and regular rhythm.     Heart sounds: No murmur heard. Pulmonary:     Effort: No respiratory distress.     Breath sounds: No wheezing or rales.  Abdominal:     General: There is no distension.     Tenderness: There is no abdominal tenderness.  Musculoskeletal:        General: Normal range of motion.     Cervical back: Normal range of motion.  Skin:    General: Skin is warm and dry.  Neurological:     General: No focal deficit present.     Mental Status: He is alert.     Procedures  .Critical Care  Performed by: Glendora Score, MD Authorized by: Glendora Score, MD   Critical care provider statement:    Critical care time (minutes):  30   Critical care was necessary to treat or prevent imminent or life-threatening deterioration of the following conditions: Critical anemia requiring blood transfusion.   Critical care was time spent personally by me on the following activities:  Development of treatment plan with patient or surrogate, discussions with consultants, evaluation of patient's response to treatment, examination of patient, ordering and review of laboratory studies, ordering and review of radiographic studies, ordering and performing treatments and interventions, pulse oximetry, re-evaluation of patient's condition and review of old charts   ED Course / MDM   Clinical  Course as of 06/16/23 0707  Fri Jun 14, 2023  1105 Creatinine(!): 1.26 Elevated from prior, baseline around 1.1-1.2 [SG]  1106 BUN: 22 Not elev [SG]    Clinical Course User Index [SG] Sloan Leiter, DO   Medical Decision Making Amount and/or Complexity of Data Reviewed Labs: ordered. Decision-making details documented in ED Course.   Patient received in handoff.  Anemia requiring blood transfusion.  Blood with specific antibodies and will take a significant amount of time to get from [place].  Plan for discharge after blood transfusion.  Patient received blood transfusion and was discharged       Glendora Score, MD 06/16/23 8469    Glendora Score, MD 06/16/23 [phone]

## 2023-06-17 ENCOUNTER — Encounter: Payer: Self-pay | Admitting: Hematology

## 2023-06-17 ENCOUNTER — Other Ambulatory Visit: Payer: Self-pay

## 2023-06-17 ENCOUNTER — Inpatient Hospital Stay: Payer: Medicare HMO | Admitting: Nurse Practitioner

## 2023-06-17 ENCOUNTER — Inpatient Hospital Stay: Payer: Medicare HMO | Attending: Hematology | Admitting: Hematology

## 2023-06-17 ENCOUNTER — Inpatient Hospital Stay: Payer: Medicare HMO

## 2023-06-17 ENCOUNTER — Encounter: Payer: Self-pay | Admitting: Cardiology

## 2023-06-17 VITALS — HR 88

## 2023-06-17 VITALS — BP 99/65 | HR 104 | Temp 97.4°F | Resp 18 | Ht 71.0 in | Wt 211.0 lb

## 2023-06-17 DIAGNOSIS — D469 Myelodysplastic syndrome, unspecified: Secondary | ICD-10-CM

## 2023-06-17 DIAGNOSIS — K2971 Gastritis, unspecified, with bleeding: Secondary | ICD-10-CM

## 2023-06-17 DIAGNOSIS — E785 Hyperlipidemia, unspecified: Secondary | ICD-10-CM | POA: Diagnosis not present

## 2023-06-17 DIAGNOSIS — D500 Iron deficiency anemia secondary to blood loss (chronic): Secondary | ICD-10-CM | POA: Diagnosis not present

## 2023-06-17 DIAGNOSIS — M545 Low back pain, unspecified: Secondary | ICD-10-CM | POA: Diagnosis not present

## 2023-06-17 DIAGNOSIS — N189 Chronic kidney disease, unspecified: Secondary | ICD-10-CM | POA: Diagnosis not present

## 2023-06-17 DIAGNOSIS — D649 Anemia, unspecified: Secondary | ICD-10-CM

## 2023-06-17 DIAGNOSIS — I5032 Chronic diastolic (congestive) heart failure: Secondary | ICD-10-CM | POA: Diagnosis not present

## 2023-06-17 DIAGNOSIS — D461 Refractory anemia with ring sideroblasts: Secondary | ICD-10-CM | POA: Diagnosis present

## 2023-06-17 DIAGNOSIS — I350 Nonrheumatic aortic (valve) stenosis: Secondary | ICD-10-CM | POA: Insufficient documentation

## 2023-06-17 DIAGNOSIS — Z7984 Long term (current) use of oral hypoglycemic drugs: Secondary | ICD-10-CM | POA: Insufficient documentation

## 2023-06-17 DIAGNOSIS — I509 Heart failure, unspecified: Secondary | ICD-10-CM | POA: Insufficient documentation

## 2023-06-17 DIAGNOSIS — I4821 Permanent atrial fibrillation: Secondary | ICD-10-CM | POA: Diagnosis not present

## 2023-06-17 DIAGNOSIS — G8929 Other chronic pain: Secondary | ICD-10-CM | POA: Diagnosis not present

## 2023-06-17 DIAGNOSIS — Z79899 Other long term (current) drug therapy: Secondary | ICD-10-CM | POA: Diagnosis not present

## 2023-06-17 DIAGNOSIS — I272 Pulmonary hypertension, unspecified: Secondary | ICD-10-CM | POA: Diagnosis not present

## 2023-06-17 DIAGNOSIS — I110 Hypertensive heart disease with heart failure: Secondary | ICD-10-CM | POA: Diagnosis not present

## 2023-06-17 DIAGNOSIS — Z556 Problems related to health literacy: Secondary | ICD-10-CM | POA: Diagnosis not present

## 2023-06-17 DIAGNOSIS — L97811 Non-pressure chronic ulcer of other part of right lower leg limited to breakdown of skin: Secondary | ICD-10-CM | POA: Diagnosis not present

## 2023-06-17 DIAGNOSIS — D472 Monoclonal gammopathy: Secondary | ICD-10-CM | POA: Insufficient documentation

## 2023-06-17 DIAGNOSIS — I739 Peripheral vascular disease, unspecified: Secondary | ICD-10-CM | POA: Diagnosis not present

## 2023-06-17 DIAGNOSIS — I2510 Atherosclerotic heart disease of native coronary artery without angina pectoris: Secondary | ICD-10-CM | POA: Insufficient documentation

## 2023-06-17 DIAGNOSIS — I130 Hypertensive heart and chronic kidney disease with heart failure and stage 1 through stage 4 chronic kidney disease, or unspecified chronic kidney disease: Secondary | ICD-10-CM | POA: Diagnosis not present

## 2023-06-17 DIAGNOSIS — I255 Ischemic cardiomyopathy: Secondary | ICD-10-CM | POA: Insufficient documentation

## 2023-06-17 DIAGNOSIS — D6869 Other thrombophilia: Secondary | ICD-10-CM | POA: Diagnosis not present

## 2023-06-17 DIAGNOSIS — I4892 Unspecified atrial flutter: Secondary | ICD-10-CM | POA: Diagnosis not present

## 2023-06-17 DIAGNOSIS — G629 Polyneuropathy, unspecified: Secondary | ICD-10-CM | POA: Diagnosis not present

## 2023-06-17 DIAGNOSIS — Z6824 Body mass index (BMI) 24.0-24.9, adult: Secondary | ICD-10-CM | POA: Diagnosis not present

## 2023-06-17 DIAGNOSIS — I872 Venous insufficiency (chronic) (peripheral): Secondary | ICD-10-CM | POA: Diagnosis not present

## 2023-06-17 DIAGNOSIS — R32 Unspecified urinary incontinence: Secondary | ICD-10-CM | POA: Diagnosis not present

## 2023-06-17 DIAGNOSIS — E669 Obesity, unspecified: Secondary | ICD-10-CM | POA: Diagnosis not present

## 2023-06-17 DIAGNOSIS — D539 Nutritional anemia, unspecified: Secondary | ICD-10-CM | POA: Diagnosis not present

## 2023-06-17 LAB — BPAM RBC
Blood Product Expiration Date: 202411142359
ISSUE DATE / TIME: 202410121203
Unit Type and Rh: 5100

## 2023-06-17 LAB — CBC WITH DIFFERENTIAL (CANCER CENTER ONLY)
Abs Immature Granulocytes: 0.04 10*3/uL (ref 0.00–0.07)
Basophils Absolute: 0 10*3/uL (ref 0.0–0.1)
Basophils Relative: 1 %
Eosinophils Absolute: 0.2 10*3/uL (ref 0.0–0.5)
Eosinophils Relative: 4 %
HCT: 22.7 % — ABNORMAL LOW (ref 39.0–52.0)
Hemoglobin: 7.1 g/dL — ABNORMAL LOW (ref 13.0–17.0)
Immature Granulocytes: 1 %
Lymphocytes Relative: 14 %
Lymphs Abs: 0.8 10*3/uL (ref 0.7–4.0)
MCH: 33.8 pg (ref 26.0–34.0)
MCHC: 31.3 g/dL (ref 30.0–36.0)
MCV: 108.1 fL — ABNORMAL HIGH (ref 80.0–100.0)
Monocytes Absolute: 0.8 10*3/uL (ref 0.1–1.0)
Monocytes Relative: 15 %
Neutro Abs: 3.8 10*3/uL (ref 1.7–7.7)
Neutrophils Relative %: 65 %
Platelet Count: 205 10*3/uL (ref 150–400)
RBC: 2.1 MIL/uL — ABNORMAL LOW (ref 4.22–5.81)
RDW: 25.5 % — ABNORMAL HIGH (ref 11.5–15.5)
WBC Count: 5.7 10*3/uL (ref 4.0–10.5)
nRBC: 0 % (ref 0.0–0.2)

## 2023-06-17 LAB — FERRITIN: Ferritin: 75 ng/mL (ref 24–336)

## 2023-06-17 LAB — TYPE AND SCREEN
ABO/RH(D): O POS
Antibody Screen: POSITIVE
Unit division: 0

## 2023-06-17 LAB — SAMPLE TO BLOOD BANK

## 2023-06-17 LAB — PREPARE RBC (CROSSMATCH)

## 2023-06-17 MED ORDER — DARBEPOETIN ALFA 500 MCG/ML IJ SOSY
500.0000 ug | PREFILLED_SYRINGE | Freq: Once | INTRAMUSCULAR | Status: AC
  Administered 2023-06-17: 500 ug via SUBCUTANEOUS
  Filled 2023-06-17: qty 1

## 2023-06-17 NOTE — Progress Notes (Signed)
[facility]   Telephone:([phone] Fax:(336) [phone]   Clinic Follow up Note   Patient Care Team: [name], FNP as PCP - General (Family Medicine) [name], MD as PCP - Cardiology (Cardiology) Lanier Prude, MD as PCP - Electrophysiology (Cardiology) Jena Gauss Gerrit Friends, MD (Gastroenterology) Carlean Jews, NP as Nurse Practitioner (Hematology and Oncology) Malachy Mood, MD as Consulting Physician (Hematology and Oncology)  Date of Service:  06/17/2023  CHIEF COMPLAINT: f/u of anemia and MDS  CURRENT THERAPY:  Aranesp every 2 weeks  Oncology History   MDS (myelodysplastic syndrome) (HCC) -Diagnosed in 04/2023 -Bone marrow biopsy: hypercellular bone marrow (50%) with dysphoretic changes, occasional hypolobated neutrophils, abnormally lobated megakaryocytes (<10%) and ring sideroblasts (15%). C/w MDS -S/p 4 units pRBCs, started Aranesp 150 mcg q14 days in 04/2023; adjusting dose as needed -Bone marrow MDS FISH panel is still pending, will check with pathology lab.  This is likely low risk disease    Assessment and Plan    Myelodysplastic Syndrome (MDS) with Anemia Hemoglobin of 7.1 despite erythropoiesis-stimulating agent (ESA) therapy for two months. Multiple blood transfusions in the past have led to the development of antibodies, complicating transfusion process. -Increase dose of ESA. -Schedule blood transfusion every two weeks. -Consider switching to a different type of injection if no response to increased ESA dose in the next 1-2 months.  Heart Failure Patient experiencing increased fluid retention despite taking four furosemide a day. Cardiology follow-up scheduled for next week. -Continue current management and follow-up with cardiology.  General Health Maintenance: -Handicap permit renewal Permanent disability confirmed, will sign the necessary paperwork.      Plan -Will increase Aranesp to 500 mcg every 2 weeks -1 unit of  blood transfusion in next few days -Will schedule lab and injection every 2 weeks, with 3-hour blood transfusion today after lab -I sent a message to pathology lab and requested MDS FISH panel on his previous bone marrow biopsy -Follow-up in 4 weeks   Discussed the use of AI scribe software for clinical note transcription with the patient, who gave verbal consent to proceed.  History of Present Illness   The patient, an 82 year old Cisneros with a history of anemia and Myelodysplastic Syndrome (MDS), presents for a follow-up visit. He has been receiving injections for his condition for almost two months, but his anemia remains poorly controlled. He recently required a blood transfusion due to low hemoglobin levels. The patient reports no issues with the blood transfusion. Despite the ongoing treatment, his hemoglobin level today is 7.1, indicating persistent anemia.  In addition to his hematological issues, the patient also has heart failure and is experiencing fluid retention. He is currently taking four furosemide a day for this condition. Despite his health issues, the patient is able to perform basic activities at home, such as going to the bathroom and dining. However, his mobility is limited due to fluid retention, and he requires assistance from a home health nurse.         All other systems were reviewed with the patient and are negative.  MEDICAL HISTORY:  Past Medical History:  Diagnosis Date   Aortic stenosis    Atrial flutter (HCC)    a. s/p remote ablation by Dr. Ladona Ridgel.   CAD (coronary artery disease)    a. s/p CABG 2003. b. 01/2016: unstable angina - s/p BMS to SVG-ramus intermediate, patent LIMA-mLAD, patent RIMA-dRCA.    CHF (congestive heart failure) (HCC)    Chronic lower back pain  Essential hypertension    Hyperlipidemia    Ischemic cardiomyopathy    a. Cath 01/2016 -  EF 50-55% with mild distal inferior hypocontractility.   Peripheral neuropathy 04/04/2014    Permanent atrial fibrillation (HCC)     SURGICAL HISTORY: Past Surgical History:  Procedure Laterality Date   BACK SURGERY     BIOPSY  11/21/2022   Procedure: BIOPSY;  Surgeon: Napoleon Form, MD;  Location: [facility] ENDOSCOPY;  Service: Gastroenterology;;   BIOPSY  02/15/2023   Procedure: BIOPSY;  Surgeon: Lanelle Bal, DO;  Location: [facility] ENDO SUITE;  Service: Endoscopy;;   CARDIAC CATHETERIZATION  ~ 2000; 2003   CARDIAC CATHETERIZATION N/A 02/01/2016   Procedure: Left Heart Cath and Cors/Grafts Angiography;  Surgeon: Lennette Bihari, MD;  Location: [facility] INVASIVE CV LAB;  Service: Cardiovascular;  Laterality: N/A;   CARDIAC CATHETERIZATION N/A 02/01/2016   Procedure: Coronary Stent Intervention;  Surgeon: Lennette Bihari, MD;  Location: [facility] INVASIVE CV LAB;  Service: Cardiovascular;  Laterality: N/A;   CATARACT EXTRACTION W/PHACO Left 02/09/2019   Procedure: CATARACT EXTRACTION PHACO AND INTRAOCULAR LENS PLACEMENT (IOC);  Surgeon: Fabio Pierce, MD;  Location: [facility] ORS;  Service: Ophthalmology;  Laterality: Left;  CDE: 27.70   CATARACT EXTRACTION W/PHACO Right 02/23/2019   Procedure: CATARACT EXTRACTION PHACO AND INTRAOCULAR LENS PLACEMENT RIGHT EYE;  Surgeon: Fabio Pierce, MD;  Location: [facility] ORS;  Service: Ophthalmology;  Laterality: Right;  CDE: 10.15   COLONOSCOPY  06/04/2002   Normal colon and rectum   COLONOSCOPY  07/08/2012   Procedure: COLONOSCOPY;  Surgeon: Corbin Ade, MD;  Location: [facility] END[facility] SUITE;  Service: Endoscopy;  Laterality: N/A;  11:30   COLONOSCOPY N/A 10/02/2017   Procedure: COLONOSCOPY;  Surgeon: Corbin Ade, MD;  Location: [facility] END[facility];  Service: Endoscopy;  Laterality: N/A;  10:30am   COLONOSCOPY WITH PROPOFOL N/A 03/13/2023   Procedure: COLONOSCOPY WITH PROPOFOL;  Surgeon: Lanelle Bal, DO;  Location: [facility] ENDO SUITE;  Service: Endoscopy;  Laterality: N/A;  1215pm, asa 3   CORONARY ANGIOPLASTY WITH STENT PLACEMENT  02/01/2016   CORONARY ARTERY BYPASS GRAFT  2003    LIMA to LAD,LIMA to distal RCA,SVG to ramus intermediate vessel.   ESOPHAGOGASTRODUODENOSCOPY (EGD) WITH PROPOFOL N/A 11/21/2022   Procedure: ESOPHAGOGASTRODUODENOSCOPY (EGD) WITH PROPOFOL;  Surgeon: Napoleon Form, MD;  Location: [facility] ENDOSCOPY;  Service: Gastroenterology;  Laterality: N/A;   ESOPHAGOGASTRODUODENOSCOPY (EGD) WITH PROPOFOL N/A 12/27/2022   Procedure: ESOPHAGOGASTRODUODENOSCOPY (EGD) WITH PROPOFOL;  Surgeon: Lanelle Bal, DO;  Location: [facility];  Service: Endoscopy;  Laterality: N/A;   ESOPHAGOGASTRODUODENOSCOPY (EGD) WITH PROPOFOL N/A 02/15/2023   Procedure: ESOPHAGOGASTRODUODENOSCOPY (EGD) WITH PROPOFOL;  Surgeon: Lanelle Bal, DO;  Location: [facility];  Service: Endoscopy;  Laterality: N/A;   FEMORAL REVISION Right 03/30/2019   Procedure: Femoral revision right total hip arthroplasty;  Surgeon: Ollen Gross, MD;  Location: [facility] ORS;  Service: Orthopedics;  Laterality: Right;    FOREIGN BODY REMOVAL Right 04/02/2014   Procedure: FOREIGN BODY REMOVAL RIGHT FOOT;  Surgeon: Dalia Heading, MD;  Location: [facility] ORS;  Service: General;  Laterality: Right;   JOINT REPLACEMENT     JOINT REPLACEMENT     LUMBAR DISC SURGERY     POLYPECTOMY  03/13/2023   Procedure: POLYPECTOMY;  Surgeon: Lanelle Bal, DO;  Location: [facility];  Service: Endoscopy;;   REVISION TOTAL HIP ARTHROPLASTY Bilateral 2005-2012   right-left   RIGHT/LEFT HEART CATH AND CORONARY/GRAFT ANGIOGRAPHY N/A 09/27/2022  Procedure: RIGHT/LEFT HEART CATH AND CORONARY/GRAFT ANGIOGRAPHY;  Surgeon: [place], Peter M, MD;  Location[facility] INVASIVE CV LAB;  Service: Cardiovascular;  Laterality: N/A;   SHOULDER OPEN ROTATOR CUFF REPAIR Right    TOTAL HIP ARTHROPLASTY Right 1999   TOTAL HIP ARTHROPLASTY Left 2008   US ECHOCARDIOGRAPHY  11/13/2011   LV mildly dilated,mild concentric LVH,LA mod - severely dilated,RA mildly dilated,mild to mod. mitral annular ca+,mild to mod MR,aortic root ca+ w/mild  dilatation,bicuspid AOV cannot be excluded.    I have reviewed the social history and family history with the patient and they are unchanged from previous note.  ALLERGIES:  is allergic to augmentin [amoxicillin-pot clavulanate].  MEDICATIONS:  Current Outpatient Medications  Medication Sig Dispense Refill   acetaminophen (TYLENOL) 325 MG tablet Take 2 tablets (650 mg total) by mouth every 6 (six) hours as needed for mild pain (or Fever >/= 101). 100 tablet 0   atorvastatin (LIPITOR) 80 MG tablet TAKE 1 TABLET BY MOUTH EVERY DAY 90 tablet 2   dapagliflozin propanediol (FARXIGA) 10 MG TABS tablet Take 10 mg by mouth daily.     Iron, Ferrous Sulfate, 325 (65 Fe) MG TABS Take 325 mg by mouth daily. 30 tablet 2   KLOR-CON M10 10 MEQ tablet Take 30 mEq by mouth daily. (Patient not taking: Reported on 06/14/2023)     metoprolol succinate (TOPROL-XL) 25 MG 24 hr tablet Take 1 tablet (25 mg total) by mouth daily. 30 tablet 0   nitroGLYCERIN (NITROSTAT) 0.4 MG SL tablet Place 1 tablet (0.4 mg total) under the tongue every 5 (five) minutes x 3 doses as needed for chest pain (if no relief after 3rd dose, proceed to the ED for an evaluation or call 911). (Patient not taking: Reported on 06/14/2023) 25 tablet 3   pantoprazole (PROTONIX) 40 MG tablet TAKE 1 TABLET BY MOUTH TWICE A DAY 180 tablet 0   Potassium Chloride ER 20 MEQ TBCR TAKE 1 TABLET BY MOUTH EVERY DAY 90 tablet 3   spironolactone (ALDACTONE) 25 MG tablet Take 0.5 tablets (12.5 mg total) by mouth daily. 15 tablet 6   torsemide (DEMADEX) 20 MG tablet Take 3 tablets (60 mg total) by mouth daily. (Patient taking differently: Take 40 mg by mouth 2 (two) times daily.) 270 tablet 3   No current facility-administered medications for this visit.    PHYSICAL EXAMINATION: ECOG PERFORMANCE STATUS: 2 - Symptomatic, <50% confined to bed  Vitals:   06/17/23 0853  BP: 99/65  Pulse: (!) 104  Resp: 18  Temp: (!) 97.4 F (36.3 C)  SpO2: 97%   Wt  Readings from Last 3 Encounters:  06/17/23 211 lb (95.7 kg)  06/14/23 205 lb 14.6 oz (93.4 kg)  06/13/23 206 lb 1.3 oz (93.5 kg)     GENERAL:alert, no distress and comfortable SKIN: skin color, texture, turgor are normal, no rashes or significant lesions EYES: normal, Conjunctiva are pink and non-injected, sclera clear Musculoskeletal:no cyanosis of digits and no clubbing, (+) bilateral lower extremity edema and skin hyperpigmentation NEURO: alert & oriented x 3 with fluent speech, no focal motor/sensory deficits   LABORATORY DATA:  I have reviewed the data as listed    Latest Ref Rng & Units 06/17/2023    8:42 AM 06/14/2023   10:33 AM 06/13/2023   10:30 AM  CBC  WBC 4.0 - 10.5 K/uL 5.7  5.4  5.9   Hemoglobin 13.0 - 17.0 g/dL 7.1  6.8  6.7   Hematocrit 39.0 - 52.0 %  22.7  22.6  21.0   Platelets 150 - 400 K/uL 205  207  206         Latest Ref Rng & Units 06/14/2023   10:33 AM 06/13/2023   10:30 AM 05/15/2023    9:31 AM  CMP  Glucose 70 - 99 mg/dL 213  98  086   BUN 8 - 23 mg/dL 22  21  17    Creatinine 0.61 - 1.24 mg/dL 5.78  4.69  6.29   Sodium 135 - 145 mmol/L 131  135  137   Potassium 3.5 - 5.1 mmol/L 3.8  4.3  3.6   Chloride 98 - 111 mmol/L 95  97  97   CO2 22 - 32 mmol/L 27  23  25    Calcium 8.9 - 10.3 mg/dL 7.9  8.0  8.2   Total Protein 6.5 - 8.1 g/dL 6.1     Total Bilirubin 0.3 - 1.2 mg/dL 2.0     Alkaline Phos 38 - 126 U/L 361     AST 15 - 41 U/L 35     ALT 0 - 44 U/L 28         RADIOGRAPHIC STUDIES: I have personally reviewed the radiological images as listed and agreed with the findings in the report. No results found.    No orders of the defined types were placed in this encounter.  All questions were answered. The patient knows to call the clinic with any problems, questions or concerns. No barriers to learning was detected. The total time spent in the appointment was 25 minutes.     Malachy Mood, MD 06/17/2023

## 2023-06-17 NOTE — Assessment & Plan Note (Signed)
-  Diagnosed in 04/2023 -Bone marrow biopsy: hypercellular bone marrow (50%) with dysphoretic changes, occasional hypolobated neutrophils, abnormally lobated megakaryocytes (<10%) and ring sideroblasts (15%). C/w MDS -S/p 4 units pRBCs, started Aranesp 150 mcg q14 days in 04/2023; adjusting dose as needed -Hgb down to 6.9 on 9/3, transfused again, continues Aranesp with Hgb goal 9-11

## 2023-06-18 ENCOUNTER — Inpatient Hospital Stay: Payer: Medicare HMO

## 2023-06-18 DIAGNOSIS — D461 Refractory anemia with ring sideroblasts: Secondary | ICD-10-CM | POA: Diagnosis not present

## 2023-06-18 DIAGNOSIS — D649 Anemia, unspecified: Secondary | ICD-10-CM

## 2023-06-18 DIAGNOSIS — D469 Myelodysplastic syndrome, unspecified: Secondary | ICD-10-CM

## 2023-06-18 DIAGNOSIS — K2971 Gastritis, unspecified, with bleeding: Secondary | ICD-10-CM

## 2023-06-18 MED ORDER — SODIUM CHLORIDE 0.9% IV SOLUTION
250.0000 mL | Freq: Once | INTRAVENOUS | Status: AC
  Administered 2023-06-18: 100 mL via INTRAVENOUS

## 2023-06-18 NOTE — Progress Notes (Signed)
Pt was administered 1 unit prbcs and tolerated it well. Pt and wife was given education on reactions and advised to f/u as scheduled. Pt and wife verbalized understanding. Pt transported out of facility in w/c stable.

## 2023-06-18 NOTE — Progress Notes (Deleted)
Cardiology Office Note:  .   Date:  06/18/2023  ID:  Leonard Cisneros, DOB 07/11/1941, MRN [id number] PCP: [name], FNP  [facility] Providers Cardiologist:  [name], MD Electrophysiologist:  Lanier Prude, MD { Click to update primary MD,subspecialty MD or APP then REFRESH:1}   History of Present Illness: .   Leonard Cisneros is a 82 y.o. male  with past medical history of CAD (s/p CABG in 2003 with cath in 2017 showing patent LIMA-LAD, patent RIMA-dRCA, and 99% stenosis of SVG-RI treated with BMS, low-risk NST in 10/2018 and 11/2021, cath in 09/2022 showing patent LIMA-LAD and RIMA-PDA with occluded SVG-RI which appeared to be chronic and med management recommended), permanent atrial fibrillation, chronic HFrEF (EF 30-35% by echo in 03/2023), aortic stenosis (mild to moderate by cath in 09/2022), HTN and HLD.  He had acute CHF 04/2023 with EF 30-35% mild AS and mild MR/MS.Troponin values did peak at 2438 but he did not undergo repeat cardiac catheterization given his anemia  ROS: ***  Studies Reviewed: [name]         Prior CV Studies: {Select studies to display:26339}   Echocardiogram: 03/2023 IMPRESSIONS     1. Left ventricular ejection fraction, by estimation, is 30 to 35%. The  left ventricle has moderately decreased function. The left ventricle  demonstrates global hypokinesis. The left ventricular internal cavity size  was mildly dilated. There is mild  concentric left ventricular hypertrophy. Left ventricular diastolic  parameters are indeterminate.   2. Right ventricular systolic function is mildly reduced. The right  ventricular size is mildly enlarged. Tricuspid regurgitation signal is  inadequate for assessing PA pressure.   3. Left atrial size was severely dilated.   4. Right atrial size was moderately dilated.   5. The mitral valve is normal in structure. Mild mitral valve  regurgitation. Mild mitral stenosis. The mean mitral valve gradient  is 6.0  mmHg. Moderate mitral annular calcification.   6. The aortic valve is tricuspid. There is moderate calcification of the  aortic valve. Aortic valve regurgitation is not visualized. Mild aortic  valve stenosis. Aortic valve mean gradient measures 14.0 mmHg.   7. The inferior vena cava is dilated in size with <50% respiratory  variability, suggesting right atrial pressure of 15 mmHg.   Risk Assessment/Calculations:   {Does this patient have ATRIAL FIBRILLATION?:[phone]}         Physical Exam:   VS:  There were no vitals taken for this visit.   Wt Readings from Last 3 Encounters:  06/17/23 211 lb (95.7 kg)  06/14/23 205 lb 14.6 oz (93.4 kg)  06/13/23 206 lb 1.3 oz (93.5 kg)    GEN: Well nourished, well developed in no acute distress NECK: No JVD; No carotid bruits CARDIAC: ***RRR, no murmurs, rubs, gallops RESPIRATORY:  Clear to auscultation without rales, wheezing or rhonchi  ABDOMEN: Soft, non-tender, non-distended EXTREMITIES:  No edema; No deformity   ASSESSMENT AND PLAN: .    Acute on Chronic HFrEF - His ejection fraction was at 30 to 35% by most recent imaging and ischemic evaluation has not been pursued given severe anemia in the setting of MDS. Recorded weights in our system have been variable as there was almost a 20 pound discrepancy within 6 days reported last month. His weight is at 195 lbs today and he reports that is close to his baseline. He does have pitting edema and I did recommend that we titrate Torsemide from 40 mg daily to 60  mg daily. Continue Farxiga 10 mg daily, Toprol-XL 25 mg daily and Spironolactone 12.5 mg daily. BP does not allow for ARB or ARNI at this time. Can consider a repeat echocardiogram later this year for reassessment of his EF but unsure that it would change his current management strategy given his severe anemia.   2. CAD - He is s/p CABG in 2003 with cath in 2017 showing patent LIMA-LAD, patent RIMA-dRCA, and 99% stenosis of SVG-RI  treated with BMS. Most recent cath in 09/2022 showed patent LIMA-LAD and RIMA-PDA with occluded SVG-RI which appeared to be chronic and med management was recommended.  - Further ischemic testing has not been pursued given his anemia. Thankfully, he denies any recent anginal symptoms. Continue Atorvastatin 80 mg daily and Toprol-XL 25 mg daily. He is no longer on ASA.   3. Permanent Atrial Fibrillation - He denies any recent palpitations and heart rate is in the 80's to 90's during today's visit. Continue Toprol-XL 25 mg daily for rate control.   - He has been off anticoagulation given his anemia and was previously undergoing workup for Watchman device placement but this has been on hold as well given his inability to tolerate even short-term anticoagulation.     4. Aortic Stenosis  - This was mild to moderate by catheterization 09/2022 and read as mild by echocardiogram in 03/2023. Continue to follow.     5. MDS/Anemia - Followed by Hematology and currently receiving infusions every 2 weeks. Hgb was at 7.2 on 05/20/2023.       {Are you ordering a CV Procedure (e.g. stress test, cath, DCCV, TEE, etc)?   Press F2        :[id number]}  Dispo: ***  Signed, Jacolyn Reedy, PA-C

## 2023-06-18 NOTE — Progress Notes (Signed)
Appointment in [facility] today

## 2023-06-18 NOTE — Telephone Encounter (Signed)
Error in charting.

## 2023-06-18 NOTE — Patient Instructions (Signed)

## 2023-06-19 ENCOUNTER — Encounter (HOSPITAL_COMMUNITY): Payer: Self-pay | Admitting: Hematology

## 2023-06-19 LAB — TYPE AND SCREEN
ABO/RH(D): O POS
Antibody Screen: POSITIVE
Unit division: 0

## 2023-06-19 LAB — BPAM RBC
Blood Product Expiration Date: 202411092359
ISSUE DATE / TIME: 202410151000
Unit Type and Rh: 5100

## 2023-06-20 ENCOUNTER — Telehealth: Payer: Self-pay | Admitting: Family Medicine

## 2023-06-20 NOTE — Telephone Encounter (Signed)
Leonard Cisneros called from [facility] home health called for plan of care need verbal orders. Calcium alginate zinc oxide for unna. 1 x week for 8 weeks. Right lower extremity Leonard Cisneros call back # [phone] for clarification on orders. Patient demanding to be seen.

## 2023-06-23 ENCOUNTER — Encounter: Payer: Self-pay | Admitting: Cardiology

## 2023-06-24 ENCOUNTER — Other Ambulatory Visit: Payer: Self-pay

## 2023-06-24 ENCOUNTER — Other Ambulatory Visit: Payer: Self-pay | Admitting: Nurse Practitioner

## 2023-06-24 ENCOUNTER — Encounter: Payer: Self-pay | Admitting: Cardiology

## 2023-06-24 ENCOUNTER — Encounter (HOSPITAL_COMMUNITY): Payer: Self-pay

## 2023-06-24 ENCOUNTER — Emergency Department (HOSPITAL_COMMUNITY): Payer: Medicare HMO

## 2023-06-24 ENCOUNTER — Inpatient Hospital Stay (HOSPITAL_COMMUNITY)
Admission: EM | Admit: 2023-06-24 | Discharge: 2023-07-05 | DRG: 291 | Disposition: E | Payer: Medicare HMO | Attending: Internal Medicine | Admitting: Internal Medicine

## 2023-06-24 ENCOUNTER — Encounter: Payer: Self-pay | Admitting: Hematology

## 2023-06-24 DIAGNOSIS — D638 Anemia in other chronic diseases classified elsewhere: Secondary | ICD-10-CM | POA: Diagnosis not present

## 2023-06-24 DIAGNOSIS — N1831 Chronic kidney disease, stage 3a: Secondary | ICD-10-CM | POA: Diagnosis not present

## 2023-06-24 DIAGNOSIS — Z96643 Presence of artificial hip joint, bilateral: Secondary | ICD-10-CM | POA: Diagnosis present

## 2023-06-24 DIAGNOSIS — D62 Acute posthemorrhagic anemia: Secondary | ICD-10-CM | POA: Diagnosis not present

## 2023-06-24 DIAGNOSIS — I5020 Unspecified systolic (congestive) heart failure: Principal | ICD-10-CM

## 2023-06-24 DIAGNOSIS — I2510 Atherosclerotic heart disease of native coronary artery without angina pectoris: Secondary | ICD-10-CM | POA: Diagnosis present

## 2023-06-24 DIAGNOSIS — K269 Duodenal ulcer, unspecified as acute or chronic, without hemorrhage or perforation: Secondary | ICD-10-CM | POA: Diagnosis present

## 2023-06-24 DIAGNOSIS — I340 Nonrheumatic mitral (valve) insufficiency: Secondary | ICD-10-CM | POA: Diagnosis present

## 2023-06-24 DIAGNOSIS — I509 Heart failure, unspecified: Secondary | ICD-10-CM | POA: Diagnosis not present

## 2023-06-24 DIAGNOSIS — N179 Acute kidney failure, unspecified: Secondary | ICD-10-CM | POA: Diagnosis present

## 2023-06-24 DIAGNOSIS — Z8249 Family history of ischemic heart disease and other diseases of the circulatory system: Secondary | ICD-10-CM | POA: Diagnosis not present

## 2023-06-24 DIAGNOSIS — R64 Cachexia: Secondary | ICD-10-CM | POA: Diagnosis present

## 2023-06-24 DIAGNOSIS — E871 Hypo-osmolality and hyponatremia: Secondary | ICD-10-CM | POA: Diagnosis present

## 2023-06-24 DIAGNOSIS — J9811 Atelectasis: Secondary | ICD-10-CM | POA: Diagnosis not present

## 2023-06-24 DIAGNOSIS — I255 Ischemic cardiomyopathy: Secondary | ICD-10-CM | POA: Diagnosis present

## 2023-06-24 DIAGNOSIS — J90 Pleural effusion, not elsewhere classified: Secondary | ICD-10-CM | POA: Diagnosis not present

## 2023-06-24 DIAGNOSIS — Z79899 Other long term (current) drug therapy: Secondary | ICD-10-CM | POA: Diagnosis not present

## 2023-06-24 DIAGNOSIS — R0902 Hypoxemia: Secondary | ICD-10-CM | POA: Diagnosis not present

## 2023-06-24 DIAGNOSIS — I130 Hypertensive heart and chronic kidney disease with heart failure and stage 1 through stage 4 chronic kidney disease, or unspecified chronic kidney disease: Principal | ICD-10-CM | POA: Diagnosis present

## 2023-06-24 DIAGNOSIS — D619 Aplastic anemia, unspecified: Secondary | ICD-10-CM | POA: Diagnosis not present

## 2023-06-24 DIAGNOSIS — R5381 Other malaise: Secondary | ICD-10-CM | POA: Diagnosis present

## 2023-06-24 DIAGNOSIS — D539 Nutritional anemia, unspecified: Secondary | ICD-10-CM | POA: Diagnosis present

## 2023-06-24 DIAGNOSIS — Z8719 Personal history of other diseases of the digestive system: Secondary | ICD-10-CM

## 2023-06-24 DIAGNOSIS — I10 Essential (primary) hypertension: Secondary | ICD-10-CM | POA: Diagnosis present

## 2023-06-24 DIAGNOSIS — Z87891 Personal history of nicotine dependence: Secondary | ICD-10-CM | POA: Diagnosis not present

## 2023-06-24 DIAGNOSIS — Z66 Do not resuscitate: Secondary | ICD-10-CM | POA: Diagnosis present

## 2023-06-24 DIAGNOSIS — D469 Myelodysplastic syndrome, unspecified: Secondary | ICD-10-CM | POA: Diagnosis present

## 2023-06-24 DIAGNOSIS — E785 Hyperlipidemia, unspecified: Secondary | ICD-10-CM | POA: Diagnosis not present

## 2023-06-24 DIAGNOSIS — I110 Hypertensive heart disease with heart failure: Secondary | ICD-10-CM | POA: Diagnosis not present

## 2023-06-24 DIAGNOSIS — I2721 Secondary pulmonary arterial hypertension: Secondary | ICD-10-CM | POA: Diagnosis present

## 2023-06-24 DIAGNOSIS — I5023 Acute on chronic systolic (congestive) heart failure: Secondary | ICD-10-CM | POA: Diagnosis present

## 2023-06-24 DIAGNOSIS — L309 Dermatitis, unspecified: Secondary | ICD-10-CM | POA: Diagnosis present

## 2023-06-24 DIAGNOSIS — J811 Chronic pulmonary edema: Secondary | ICD-10-CM | POA: Diagnosis not present

## 2023-06-24 DIAGNOSIS — R918 Other nonspecific abnormal finding of lung field: Secondary | ICD-10-CM | POA: Diagnosis not present

## 2023-06-24 DIAGNOSIS — R54 Age-related physical debility: Secondary | ICD-10-CM | POA: Diagnosis present

## 2023-06-24 DIAGNOSIS — K279 Peptic ulcer, site unspecified, unspecified as acute or chronic, without hemorrhage or perforation: Secondary | ICD-10-CM | POA: Diagnosis not present

## 2023-06-24 DIAGNOSIS — R570 Cardiogenic shock: Secondary | ICD-10-CM | POA: Diagnosis not present

## 2023-06-24 DIAGNOSIS — I872 Venous insufficiency (chronic) (peripheral): Secondary | ICD-10-CM | POA: Diagnosis not present

## 2023-06-24 DIAGNOSIS — Z8711 Personal history of peptic ulcer disease: Secondary | ICD-10-CM

## 2023-06-24 DIAGNOSIS — I5021 Acute systolic (congestive) heart failure: Secondary | ICD-10-CM | POA: Diagnosis not present

## 2023-06-24 DIAGNOSIS — I4821 Permanent atrial fibrillation: Secondary | ICD-10-CM | POA: Diagnosis not present

## 2023-06-24 DIAGNOSIS — N5089 Other specified disorders of the male genital organs: Secondary | ICD-10-CM | POA: Diagnosis present

## 2023-06-24 DIAGNOSIS — I25810 Atherosclerosis of coronary artery bypass graft(s) without angina pectoris: Secondary | ICD-10-CM | POA: Diagnosis present

## 2023-06-24 DIAGNOSIS — Z515 Encounter for palliative care: Secondary | ICD-10-CM | POA: Diagnosis not present

## 2023-06-24 DIAGNOSIS — Z7189 Other specified counseling: Secondary | ICD-10-CM | POA: Diagnosis not present

## 2023-06-24 DIAGNOSIS — D649 Anemia, unspecified: Secondary | ICD-10-CM | POA: Diagnosis not present

## 2023-06-24 DIAGNOSIS — Z955 Presence of coronary angioplasty implant and graft: Secondary | ICD-10-CM | POA: Diagnosis not present

## 2023-06-24 DIAGNOSIS — I517 Cardiomegaly: Secondary | ICD-10-CM | POA: Diagnosis not present

## 2023-06-24 DIAGNOSIS — I350 Nonrheumatic aortic (valve) stenosis: Secondary | ICD-10-CM | POA: Diagnosis present

## 2023-06-24 DIAGNOSIS — Z6829 Body mass index (BMI) 29.0-29.9, adult: Secondary | ICD-10-CM

## 2023-06-24 DIAGNOSIS — I4891 Unspecified atrial fibrillation: Secondary | ICD-10-CM | POA: Diagnosis present

## 2023-06-24 DIAGNOSIS — I482 Chronic atrial fibrillation, unspecified: Secondary | ICD-10-CM | POA: Diagnosis not present

## 2023-06-24 DIAGNOSIS — Z452 Encounter for adjustment and management of vascular access device: Secondary | ICD-10-CM | POA: Diagnosis not present

## 2023-06-24 DIAGNOSIS — R0989 Other specified symptoms and signs involving the circulatory and respiratory systems: Secondary | ICD-10-CM | POA: Diagnosis not present

## 2023-06-24 DIAGNOSIS — R0600 Dyspnea, unspecified: Secondary | ICD-10-CM | POA: Diagnosis not present

## 2023-06-24 DIAGNOSIS — I959 Hypotension, unspecified: Secondary | ICD-10-CM | POA: Diagnosis not present

## 2023-06-24 LAB — CBC
HCT: 24.2 % — ABNORMAL LOW (ref 39.0–52.0)
Hemoglobin: 7.2 g/dL — ABNORMAL LOW (ref 13.0–17.0)
MCH: 32.9 pg (ref 26.0–34.0)
MCHC: 29.8 g/dL — ABNORMAL LOW (ref 30.0–36.0)
MCV: 110.5 fL — ABNORMAL HIGH (ref 80.0–100.0)
Platelets: 212 10*3/uL (ref 150–400)
RBC: 2.19 MIL/uL — ABNORMAL LOW (ref 4.22–5.81)
RDW: 26.2 % — ABNORMAL HIGH (ref 11.5–15.5)
WBC: 12.4 10*3/uL — ABNORMAL HIGH (ref 4.0–10.5)
nRBC: 0.5 % — ABNORMAL HIGH (ref 0.0–0.2)

## 2023-06-24 LAB — BASIC METABOLIC PANEL
Anion gap: 7 (ref 5–15)
BUN: 34 mg/dL — ABNORMAL HIGH (ref 8–23)
CO2: 27 mmol/L (ref 22–32)
Calcium: 7.9 mg/dL — ABNORMAL LOW (ref 8.9–10.3)
Chloride: 98 mmol/L (ref 98–111)
Creatinine, Ser: 1.52 mg/dL — ABNORMAL HIGH (ref 0.61–1.24)
GFR, Estimated: 46 mL/min — ABNORMAL LOW (ref >60)
Glucose, Bld: 91 mg/dL (ref 70–99)
Potassium: 4.2 mmol/L (ref 3.5–5.1)
Sodium: 132 mmol/L — ABNORMAL LOW (ref 135–145)

## 2023-06-24 LAB — BRAIN NATRIURETIC PEPTIDE: B Natriuretic Peptide: 847 pg/mL — ABNORMAL HIGH (ref 0.0–100.0)

## 2023-06-24 LAB — TROPONIN I (HIGH SENSITIVITY)
Troponin I (High Sensitivity): 14 ng/L (ref <18)
Troponin I (High Sensitivity): 14 ng/L (ref <18)

## 2023-06-24 MED ORDER — ONDANSETRON HCL 4 MG/2ML IJ SOLN: 4.0000 mg | Freq: Four times a day (QID) | INTRAMUSCULAR | Status: DC | PRN

## 2023-06-24 MED ORDER — FUROSEMIDE 10 MG/ML IJ SOLN
80.0000 mg | Freq: Once | INTRAMUSCULAR | Status: AC
  Administered 2023-06-24: 80 mg via INTRAVENOUS
  Filled 2023-06-24: qty 8

## 2023-06-24 MED ORDER — ACETAMINOPHEN 650 MG RE SUPP: 650.0000 mg | Freq: Four times a day (QID) | RECTAL | Status: DC | PRN

## 2023-06-24 MED ORDER — ACETAMINOPHEN 500 MG PO TABS
500.0000 mg | ORAL_TABLET | Freq: Four times a day (QID) | ORAL | Status: DC | PRN
  Administered 2023-06-28 (×2): 500 mg via ORAL
  Filled 2023-06-24 (×2): qty 1

## 2023-06-24 MED ORDER — FUROSEMIDE 10 MG/ML IJ SOLN
60.0000 mg | Freq: Two times a day (BID) | INTRAMUSCULAR | Status: DC
Start: 2023-06-25 — End: 2023-06-26
  Administered 2023-06-25 – 2023-06-26 (×3): 60 mg via INTRAVENOUS
  Filled 2023-06-24 (×3): qty 6

## 2023-06-24 MED ORDER — SODIUM CHLORIDE 0.9% IV SOLUTION: Freq: Once | INTRAVENOUS | Status: DC

## 2023-06-24 MED ORDER — ONDANSETRON HCL 4 MG PO TABS: 4.0000 mg | ORAL_TABLET | Freq: Four times a day (QID) | ORAL | Status: DC | PRN

## 2023-06-24 MED ORDER — HEPARIN SODIUM (PORCINE) 5000 UNIT/ML IJ SOLN: 5000.0000 [IU] | Freq: Three times a day (TID) | INTRAMUSCULAR | Status: DC

## 2023-06-24 MED ORDER — SORBITOL 70 % SOLN: 30.0000 mL | Freq: Every day | Status: DC | PRN

## 2023-06-24 MED ORDER — ACETAMINOPHEN 325 MG PO TABS
650.0000 mg | ORAL_TABLET | Freq: Once | ORAL | Status: AC
  Administered 2023-06-25: 650 mg via ORAL
  Filled 2023-06-24: qty 2

## 2023-06-24 MED ORDER — IRON (FERROUS SULFATE) 325 (65 FE) MG PO TABS: 325.0000 mg | ORAL_TABLET | Freq: Every day | ORAL | 1 refills | Status: DC

## 2023-06-24 NOTE — ED Provider Notes (Signed)
Leonard Cisneros Provider Note   CSN: [id number] Arrival date & time: 06/24/23  1301     History {Add pertinent medical, surgical, social history, OB history to HPI:1} Chief Complaint  Patient presents with   Weakness    Leonard Cisneros is a 82 y.o. male.  Patient complains of worsening swelling in his legs.  Patient has severe congestive heart failure.  Patient gained 10 pounds in a week   Weakness      Home Medications Prior to Admission medications   Medication Sig Start Date End Date Taking? Authorizing Provider  acetaminophen (TYLENOL) 325 MG tablet Take 2 tablets (650 mg total) by mouth every 6 (six) hours as needed for mild pain (or Fever >/= 101). 02/16/23   [name], MD  atorvastatin (LIPITOR) 80 MG tablet TAKE 1 TABLET BY MOUTH EVERY DAY 06/14/23   [name], MD  dapagliflozin propanediol (FARXIGA) 10 MG TABS tablet Take 10 mg by mouth daily.    [provider]  Iron, Ferrous Sulfate, 325 (65 Fe) MG TABS Take 325 mg by mouth daily. 06/24/23   [name], [name], NP  KLOR-CON M10 10 MEQ tablet Take 30 mEq by mouth daily. Patient not taking: Reported on 06/14/2023 05/22/23   [provider]  metoprolol succinate (TOPROL-XL) 25 MG 24 hr tablet Take 1 tablet (25 mg total) by mouth daily. 04/15/23 06/14/23  Arrien, York Ram, MD  nitroGLYCERIN (NITROSTAT) 0.4 MG SL tablet Place 1 tablet (0.4 mg total) under the tongue every 5 (five) minutes x 3 doses as needed for chest pain (if no relief after 3rd dose, proceed to the ED for an evaluation or call 911). Patient not taking: Reported on 06/14/2023 09/05/22   [name], MD  pantoprazole (PROTONIX) 40 MG tablet TAKE 1 TABLET BY MOUTH TWICE A DAY 05/27/23   [name], NP  Potassium Chloride ER 20 MEQ TBCR TAKE 1 TABLET BY MOUTH EVERY DAY 06/03/23   [name], MD  spironolactone (ALDACTONE) 25 MG tablet Take 0.5 tablets (12.5 mg  total) by mouth daily. 05/09/23   [name], MD  torsemide (DEMADEX) 20 MG tablet Take 3 tablets (60 mg total) by mouth daily. Patient taking differently: Take 40 mg by mouth 2 (two) times daily. 05/24/23 08/22/23  [name], PA-C      Allergies    Augmentin [amoxicillin-pot clavulanate]    Review of Systems   Review of Systems  Neurological:  Positive for weakness.    Physical Exam Updated Vital Signs BP (!) 95/53   Pulse (!) 109   Temp 99.2 F (37.3 C) (Oral)   Resp 16   Wt 94.8 kg   SpO2 100%   BMI 29.15 kg/m  Physical Exam  ED Results / Procedures / Treatments   Labs (all labs ordered are listed, but only abnormal results are displayed) Labs Reviewed  BASIC METABOLIC PANEL - Abnormal; Notable for the following components:      Result Value   Sodium 132 (*)    BUN 34 (*)    Creatinine, Ser 1.52 (*)    Calcium 7.9 (*)    GFR, Estimated 46 (*)    All other components within normal limits  CBC - Abnormal; Notable for the following components:   WBC 12.4 (*)    RBC 2.19 (*)    Hemoglobin 7.2 (*)    HCT 24.2 (*)    MCV 110.5 (*)  MCHC 29.8 (*)    RDW 26.2 (*)    nRBC 0.5 (*)    All other components within normal limits  BRAIN NATRIURETIC PEPTIDE - Abnormal; Notable for the following components:   B Natriuretic Peptide 847.0 (*)    All other components within normal limits  TROPONIN I (HIGH SENSITIVITY)  TROPONIN I (HIGH SENSITIVITY)    EKG None  Radiology DG Chest 2 View  Result Date: 06/24/2023 CLINICAL DATA:  Shortness of breath. EXAM: CHEST - 2 VIEW COMPARISON:  03/31/2023. FINDINGS: There are bilateral small-to-moderate pleural effusions with associated compressive atelectatic changes, worsened since the prior study. There is moderate pulmonary vascular congestion/edema. Evaluation of cardiomediastinal silhouette is nondiagnostic due to bilateral lower hemithorax opacification. No acute osseous abnormalities. Sternotomy wires noted.  The soft tissues are within normal limits. IMPRESSION: 1. Moderate congestive heart failure/pulmonary edema. 2. Small to moderate bilateral pleural effusions with associated compressive atelectatic changes, worsened since the prior study. Electronically Signed   By: Jules Schick M.D.   On: 06/24/2023 16:05    Procedures Procedures  {Document cardiac monitor, telemetry assessment procedure when appropriate:1}  Medications Ordered in ED Medications  furosemide (LASIX) injection 80 mg (has no administration in time range)    ED Course/ Medical Decision Making/ A&P   {   Click here for ABCD2, HEART and other calculatorsREFRESH Note before signing :1}                              Medical Decision Making Amount and/or Complexity of Data Reviewed Labs: ordered. Radiology: ordered.  Risk Prescription drug management. Decision regarding hospitalization.   Patient with worsening congestive heart failure he will be admitted to medicine for diuresis  {Document critical care time when appropriate:1} {Document review of labs and clinical decision tools ie heart score, Chads2Vasc2 etc:1}  {Document your independent review of radiology images, and any outside records:1} {Document your discussion with family members, caretakers, and with consultants:1} {Document social determinants of health affecting pt's care:1} {Document your decision making why or why not admission, treatments were needed:1} Final Clinical Impression(s) / ED Diagnoses Final diagnoses:  Systolic congestive heart failure, unspecified HF chronicity (HCC)    Rx / DC Orders ED Discharge Orders     None

## 2023-06-24 NOTE — H&P (Addendum)
History and Physical    Patient: Leonard Cisneros ZOX:[id number] DOB: [birth year] DOA: 06/29/2023 DOS: the patient was seen and examined on 06/29/2023 PCP: [name], FNP  Patient coming from: Home  Chief Complaint:  Chief Complaint  Patient presents with   Weakness   HPI: Leonard Cisneros is a 82 y.o. male with medical history significant of EF=30% months ago, CAD, h/o CABG, afib, mild AS and mild Mitral stenosis.  His last cath was 09/2022. 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  He was admitted for GI Bleed in April 2024.  The patient is not able to provide any details of his symptoms in the last couple days or how he presented to the emergency room today. He is ED evaluation revealed the patient who has significantly decompensated congestive heart failure.  He also had mild hypotension which I see he had last hospitalization as well.  He will be admitted here and diuresed.  Despite his hypotension he had no difficulty with IV Lasix.  As matter-of-fact his blood pressure improved slightly after receiving Lasix. The patient could not remember any details about his condition over the last couple of days and when I asked him if he was short of breath he says he does it does happen sometimes.  While he had difficulty remembering how he felt or was breathing earlier today, he was oriented x 3 and able to name the president and talk about politics.  Review of Systems: As mentioned in the history of present illness. All other systems reviewed and are negative. Past Medical History:  Diagnosis Date   Aortic stenosis    Atrial flutter (HCC)    a. s/p remote ablation by Dr. Ladona Ridgel.   CAD (coronary artery disease)    a. s/p CABG 2003. b. 01/2016: unstable angina - s/p BMS to SVG-ramus intermediate, patent LIMA-mLAD, patent RIMA-dRCA.    CHF (congestive heart  failure) (HCC)    Chronic lower back pain    Essential hypertension    Hyperlipidemia    Ischemic cardiomyopathy    a. Cath 01/2016 -  EF 50-55% with mild distal inferior hypocontractility.   Peripheral neuropathy 04/04/2014   Permanent atrial fibrillation [facility])    Past Surgical History:  Procedure Laterality Date   BACK SURGERY     BIOPSY  11/21/2022   Procedure: BIOPSY;  Surgeon: Napoleon Form, MD;  Location: [facility] ENDOSCOPY;  Service: Gastroenterology;;   BIOPSY  02/15/2023   Procedure: BIOPSY;  Surgeon: Lanelle Bal, DO;  Location: [facility] ENDO SUITE;  Service: Endoscopy;;   CARDIAC CATHETERIZATION  ~ 2000; 2003   CARDIAC CATHETERIZATION N/A 02/01/2016   Procedure: Left Heart Cath and Cors/Grafts Angiography;  Surgeon: Lennette Bihari, MD;  Location: [facility] INVASIVE CV LAB;  Service: Cardiovascular;  Laterality: N/A;   CARDIAC CATHETERIZATION N/A 02/01/2016   Procedure: Coronary Stent Intervention;  Surgeon: Lennette Bihari, MD;  Location: [facility] INVASIVE CV LAB;  Service: Cardiovascular;  Laterality: N/A;   CATARACT EXTRACTION W/PHACO Left 02/09/2019   Procedure: CATARACT EXTRACTION PHACO AND INTRAOCULAR LENS PLACEMENT (IOC);  Surgeon: Fabio Pierce, MD;  Location: [facility] ORS;  Service: Ophthalmology;  Laterality: Left;  CDE: 27.70   CATARACT EXTRACTION W/PHACO Right 02/23/2019   Procedure: CATARACT EXTRACTION PHACO AND INTRAOCULAR LENS PLACEMENT RIGHT EYE;  Surgeon: Fabio Pierce, MD;  Location: [facility] ORS;  Service: Ophthalmology;  Laterality: Right;  CDE: 10.15   COLONOSCOPY  06/04/2002   Normal colon and rectum   COLONOSCOPY  07/08/2012   Procedure: COLONOSCOPY;  Surgeon: Corbin Ade, MD;  Location: [facility] ENDO SUITE;  Service: Endoscopy;  Laterality: N/A;  11:30   COLONOSCOPY N/A 10/02/2017   Procedure: COLONOSCOPY;  Surgeon: Corbin Ade, MD;  Location: [facility] ENDO SUITE;  Service: Endoscopy;  Laterality: N/A;  10:30am   COLONOSCOPY WITH PROPOFOL N/A 03/13/2023   Procedure: COLONOSCOPY WITH  PROPOFOL;  Surgeon: Lanelle Bal, DO;  Location: [facility] ENDO SUITE;  Service: Endoscopy;  Laterality: N/A;  1215pm, asa 3   CORONARY ANGIOPLASTY WITH STENT PLACEMENT  02/01/2016   CORONARY ARTERY BYPASS GRAFT  2003   LIMA to LAD,LIMA to distal RCA,SVG to ramus intermediate vessel.   ESOPHAGOGASTRODUODENOSCOPY (EGD) WITH PROPOFOL N/A 11/21/2022   Procedure: ESOPHAGOGASTRODUODENOSCOPY (EGD) WITH PROPOFOL;  Surgeon: Napoleon Form, MD;  Location: [facility] ENDOSCOPY;  Service: Gastroenterology;  Laterality: N/A;   ESOPHAGOGASTRODUODENOSCOPY (EGD) WITH PROPOFOL N/A 12/27/2022   Procedure: ESOPHAGOGASTRODUODENOSCOPY (EGD) WITH PROPOFOL;  Surgeon: Lanelle Bal, DO;  Location: [facility] END[facility]ITE;  Service: Endoscopy;  Laterality: N/A;   ESOPHAGOGASTRODUODENOSCOPY (EGD) WITH PROPOFOL N/A 02/15/2023   Procedure: ESOPHAGOGASTRODUODENOSCOPY (EGD) WITH PROPOFOL;  Surgeon: Lanelle Bal, DO;  Location: [facility] END[facility];  Service: Endoscopy;  Laterality: N/A;   FEMORAL REVISION Right 03/30/2019   Procedure: Femoral revision right total hip arthroplasty;  Surgeon: Ollen Gross, MD;  Location: [facility] ORS;  Service: Orthopedics;  Laterality: Right;    FOREIGN BODY REMOVAL Right 04/02/2014   Procedure: FOREIGN BODY REMOVAL RIGHT FOOT;  Surgeon: Dalia Heading, MD;  Location: [facility] ORS;  Service: General;  Laterality: Right;   JOINT REPLACEMENT     JOINT REPLACEMENT     LUMBAR DISC SURGERY     POLYPECTOMY  03/13/2023   Procedure: POLYPECTOMY;  Surgeon: Lanelle Bal, DO;  Location: [facility] ENDO [facility];  Service: Endoscopy;;   REVISION TOTAL HIP ARTHROPLASTY Bilateral 2005-2012   right-left   RIGHT/LEFT HEART CATH AND CORONARY/GRAFT ANGIOGRAPHY N/A 09/27/2022   Procedure: RIGHT/LEFT HEART CATH AND CORONARY/GRAFT ANGIOGRAPHY;  Surgeon: [place], Peter M, MD;  Location: [facility] INVASIVE CV LAB;  Service: Cardiovascular;  Laterality: N/A;   SHOULDER OPEN ROTATOR CUFF REPAIR Right    TOTAL HIP ARTHROPLASTY Right 1999   TOTAL HIP  ARTHROPLASTY Left 2008   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   Social History:  reports that he quit smoking about 56 years ago. His smoking use included cigarettes. He started smoking about 58 years ago. He has a 1 pack-year smoking history. He has never used smokeless tobacco. He reports current alcohol use of about 11.0 standard drinks of alcohol per week. He reports that he does not use drugs.  Allergies  Allergen Reactions   Augmentin [Amoxicillin-Pot Clavulanate] Nausea And Vomiting    Family History  Problem Relation Age of Onset   Heart attack Brother    Colon cancer Neg Hx     Prior to Admission medications   Medication Sig Start Date End Date Taking? Authorizing Provider  acetaminophen (TYLENOL) 325 MG tablet Take 2 tablets (650 mg total) by mouth every 6 (six) hours as needed for mild pain (or Fever >/= 101). 02/16/23   [name], MD  atorvastatin (LIPITOR) 80 MG tablet TAKE 1 TABLET BY MOUTH  EVERY DAY 06/14/23   [name], MD  dapagliflozin propanediol (FARXIGA) 10 MG TABS tablet Take 10 mg by mouth daily.    [provider]  Iron, Ferrous Sulfate, 325 (65 Fe) MG TABS Take 325 mg by mouth daily. 07/03/2023   [name], [name], NP  KLOR-CON M10 10 MEQ tablet Take 30 mEq by mouth daily. Patient not taking: Reported on 06/14/2023 05/22/23   [provider]  metoprolol succinate (TOPROL-XL) 25 MG 24 hr tablet Take 1 tablet (25 mg total) by mouth daily. 04/15/23 06/14/23  Arrien, York Ram, MD  nitroGLYCERIN (NITROSTAT) 0.4 MG SL tablet Place 1 tablet (0.4 mg total) under the tongue every 5 (five) minutes x 3 doses as needed for chest pain (if no relief after 3rd dose, proceed to the ED for an evaluation or call 911). Patient not taking: Reported on  06/14/2023 09/05/22   [name], MD  pantoprazole (PROTONIX) 40 MG tablet TAKE 1 TABLET BY MOUTH TWICE A DAY 05/27/23   [name], NP  Potassium Chloride ER 20 MEQ TBCR TAKE 1 TABLET BY MOUTH EVERY DAY 06/03/23   [name], MD  spironolactone (ALDACTONE) 25 MG tablet Take 0.5 tablets (12.5 mg total) by mouth daily. 05/09/23   [name], MD  torsemide (DEMADEX) 20 MG tablet Take 3 tablets (60 mg total) by mouth daily. Patient taking differently: Take 40 mg by mouth 2 (two) times daily. 05/24/23 08/22/23  [name], PA-C    Physical Exam: Vitals:   06/29/2023 2045 06/12/2023 2100 07/01/2023 2115 06/13/2023 2130  BP: 91/60 96/61 100/66 101/63  Pulse: 99 95 (!) 101 (!) 104  Resp: 20 20  (!) 23  Temp:      TempSrc:      SpO2: 100% 92% 91% 100%  Weight:        Physical Exam:  General: frail, chronically ill appearing, cachectic HEENT: Normocephalic, atraumatic, PERRL Cardiovascular: Normal rate and rhythm. Distal pulses intact. Pulmonary: Normal pulmonary effort, normal breath sounds Gastrointestinal: distended abdomen, soft, non-tender, normoactive bowel sounds Musculoskeletal:Normal ROM, 4+ massive edema/ anasarca Skin: hyper pigmented Neuro: No focal deficits noted, AAOx3. HOH PSYCH: Attentive and cooperative  Data Reviewed:  Results for orders placed or performed during the hospital encounter of 06/13/2023 (from the past 24 hour(s))  Basic metabolic panel     Status: Abnormal   Collection Time: 06/06/2023  2:35 PM  Result Value Ref Range   Sodium 132 (L) 135 - 145 mmol/L   Potassium 4.2 3.5 - 5.1 mmol/L   Chloride 98 98 - 111 mmol/L   CO2 27 22 - 32 mmol/L   Glucose, Bld 91 70 - 99 mg/dL   BUN 34 (H) 8 - 23 mg/dL   Creatinine, Ser 4.69 (H) 0.61 - 1.24 mg/dL   Calcium 7.9 (L) 8.9 - 10.3 mg/dL   GFR, Estimated 46 (L) >60 mL/min   Anion gap 7 5 - 15  CBC     Status: Abnormal   Collection Time: 06/04/2023  2:35 PM  Result Value Ref Range   WBC 12.4  (H) 4.0 - 10.5 K/uL   RBC 2.19 (L) 4.22 - 5.81 MIL/uL   Hemoglobin 7.2 (L) 13.0 - 17.0 g/dL   HCT 62.9 (L) 52.8 - 41.3 %   MCV 110.5 (H) 80.0 - 100.0 fL   MCH 32.9 26.0 - 34.0 pg   MCHC 29.8 (L) 30.0 - 36.0 g/dL   RDW 24.4 (H) 01.0 - 27.2 %  Platelets 212 150 - 400 K/uL   nRBC 0.5 (H) 0.0 - 0.2 %  Troponin I (High Sensitivity)     Status: None   Collection Time: 2023-06-26  2:35 PM  Result Value Ref Range   Troponin I (High Sensitivity) 14 <18 ng/L  Brain natriuretic peptide     Status: Abnormal   Collection Time: 06/26/23  2:35 PM  Result Value Ref Range   B Natriuretic Peptide 847.0 (H) 0.0 - 100.0 pg/mL  Troponin I (High Sensitivity)     Status: None   Collection Time: 2023/06/26  5:03 PM  Result Value Ref Range   Troponin I (High Sensitivity) 14 <18 ng/L  Type and screen     Status: None (Preliminary result)   Collection Time: 2023-06-26  8:17 PM  Result Value Ref Range   ABO/RH(D) O POS    Antibody Screen PENDING    Sample Expiration      06/27/2023,2359 Performed at [facility], [address], [place], [place] [address]      Assessment and Plan: CHF exacerbation - Diuresis .  Consider cardiology consult.  Work-up reveals elevated BNP and evidence of acute pulmonary edema on chest imaging Patient has been placed on intravenous diuretics. Strict input and output monitoring Supplemental oxygen for bouts of hypoxia Monitoring renal function and electrolytes with serial chemistires Daily weights Monitoring patient on telemetry Last Echocardiogram done 3 months ago.    2. Anemia -patient has antibodies so his transfer any transfusions will have to come from [place].  Hgb have not improved with Aranesp per oncology note.   - Guaiac stool.  Will hold DVT prophylaxis until GI Bleed ruled out.  - Plan for transfusion to keep hemoglobin greater then 8 in this patient with known coronary artery disease and decompensated CHF.   Advance Care Planning:   Code Status: Full Code  the patient names his wife as his surrogate decision maker and wants to be full code.  Consults: none  Family Communication: none  Severity of Illness: The appropriate patient status for this patient is INPATIENT. Inpatient status is judged to be reasonable and necessary in order to provide the required intensity of service to ensure the patient's safety. The patient's presenting symptoms, physical exam findings, and initial radiographic and laboratory data in the context of their chronic comorbidities is felt to place them at high risk for further clinical deterioration. Furthermore, it is not anticipated that the patient will be medically stable for discharge from the hospital within 2 midnights of admission.   * I certify that at the point of admission it is my clinical judgment that the patient will require inpatient hospital care spanning beyond 2 midnights from the point of admission due to high intensity of service, high risk for further deterioration and high frequency of surveillance required.*  Author: Buena Irish, MD 06/26/2023 10:38 PM  For on call review [email or web address].

## 2023-06-24 NOTE — ED Triage Notes (Signed)
C/o weight gain 10lbs in 1 week, weakness, shob and swelling to scrotum, abd, lower legs, and hands.  Denies cp Wife reports weeping to lower extremities.  Hx chf.  Recent blood transfusion on 10/14

## 2023-06-25 ENCOUNTER — Ambulatory Visit: Payer: Medicare HMO | Admitting: Physician Assistant

## 2023-06-25 ENCOUNTER — Ambulatory Visit: Payer: Medicare HMO | Admitting: Urology

## 2023-06-25 ENCOUNTER — Encounter: Payer: Self-pay | Admitting: Cardiology

## 2023-06-25 ENCOUNTER — Inpatient Hospital Stay: Payer: Self-pay

## 2023-06-25 ENCOUNTER — Inpatient Hospital Stay (HOSPITAL_COMMUNITY): Payer: Medicare HMO

## 2023-06-25 DIAGNOSIS — I5021 Acute systolic (congestive) heart failure: Secondary | ICD-10-CM | POA: Diagnosis not present

## 2023-06-25 DIAGNOSIS — I5023 Acute on chronic systolic (congestive) heart failure: Secondary | ICD-10-CM

## 2023-06-25 DIAGNOSIS — D619 Aplastic anemia, unspecified: Secondary | ICD-10-CM

## 2023-06-25 DIAGNOSIS — I482 Chronic atrial fibrillation, unspecified: Secondary | ICD-10-CM

## 2023-06-25 DIAGNOSIS — I959 Hypotension, unspecified: Secondary | ICD-10-CM

## 2023-06-25 DIAGNOSIS — K279 Peptic ulcer, site unspecified, unspecified as acute or chronic, without hemorrhage or perforation: Secondary | ICD-10-CM

## 2023-06-25 DIAGNOSIS — N179 Acute kidney failure, unspecified: Secondary | ICD-10-CM

## 2023-06-25 LAB — BASIC METABOLIC PANEL
Anion gap: 8 (ref 5–15)
BUN: 33 mg/dL — ABNORMAL HIGH (ref 8–23)
CO2: 26 mmol/L (ref 22–32)
Calcium: 7.7 mg/dL — ABNORMAL LOW (ref 8.9–10.3)
Chloride: 99 mmol/L (ref 98–111)
Creatinine, Ser: 1.5 mg/dL — ABNORMAL HIGH (ref 0.61–1.24)
GFR, Estimated: 46 mL/min — ABNORMAL LOW (ref >60)
Glucose, Bld: 90 mg/dL (ref 70–99)
Potassium: 3.8 mmol/L (ref 3.5–5.1)
Sodium: 133 mmol/L — ABNORMAL LOW (ref 135–145)

## 2023-06-25 LAB — HEPATIC FUNCTION PANEL
ALT: 25 U/L (ref 0–44)
AST: 28 U/L (ref 15–41)
Albumin: 2.4 g/dL — ABNORMAL LOW (ref 3.5–5.0)
Alkaline Phosphatase: 338 U/L — ABNORMAL HIGH (ref 38–126)
Bilirubin, Direct: 1.4 mg/dL — ABNORMAL HIGH (ref 0.0–0.2)
Indirect Bilirubin: 1.6 mg/dL — ABNORMAL HIGH (ref 0.3–0.9)
Total Bilirubin: 3 mg/dL — ABNORMAL HIGH (ref 0.3–1.2)
Total Protein: 5.5 g/dL — ABNORMAL LOW (ref 6.5–8.1)

## 2023-06-25 LAB — ECHOCARDIOGRAM LIMITED
AR max vel: 1.21 cm2
AV Area VTI: 1.06 cm2
AV Area mean vel: 1.15 cm2
AV Mean grad: 23.5 mm[Hg]
AV Peak grad: 42.3 mm[Hg]
Ao pk vel: 3.25 m/s
Height: 71 in
MV M vel: 4.54 m/s
MV Peak grad: 82.4 mm[Hg]
Radius: 0.4 cm
S' Lateral: 4.9 cm
Weight: 3319.25 [oz_av]

## 2023-06-25 LAB — VITAMIN B12: Vitamin B-12: 839 pg/mL (ref 180–914)

## 2023-06-25 LAB — CBC
HCT: 21.3 % — ABNORMAL LOW (ref 39.0–52.0)
Hemoglobin: 6.5 g/dL — CL (ref 13.0–17.0)
MCH: 33.5 pg (ref 26.0–34.0)
MCHC: 30.5 g/dL (ref 30.0–36.0)
MCV: 109.8 fL — ABNORMAL HIGH (ref 80.0–100.0)
Platelets: 167 10*3/uL (ref 150–400)
RBC: 1.94 MIL/uL — ABNORMAL LOW (ref 4.22–5.81)
RDW: 26.4 % — ABNORMAL HIGH (ref 11.5–15.5)
WBC: 12.4 10*3/uL — ABNORMAL HIGH (ref 4.0–10.5)
nRBC: 0.2 % (ref 0.0–0.2)

## 2023-06-25 LAB — FOLATE: Folate: 12.2 ng/mL (ref >5.9)

## 2023-06-25 LAB — PROCALCITONIN: Procalcitonin: 0.21 ng/mL

## 2023-06-25 LAB — MRSA NEXT GEN BY PCR, NASAL: MRSA by PCR Next Gen: NOT DETECTED

## 2023-06-25 LAB — PREPARE RBC (CROSSMATCH)

## 2023-06-25 LAB — IRON AND TIBC
Iron: 35 ug/dL — ABNORMAL LOW (ref 45–182)
Saturation Ratios: 15 % — ABNORMAL LOW (ref 17.9–39.5)
TIBC: 242 ug/dL — ABNORMAL LOW (ref 250–450)
UIBC: 207 ug/dL

## 2023-06-25 LAB — COOXEMETRY PANEL
Carboxyhemoglobin: 2.6 % — ABNORMAL HIGH (ref 0.5–1.5)
Methemoglobin: <0.7 % (ref 0.0–1.5)
O2 Saturation: 66.9 %
Total hemoglobin: <6.9 g/dL — CL (ref 12.0–16.0)

## 2023-06-25 LAB — LACTIC ACID, PLASMA: Lactic Acid, Venous: 1.7 mmol/L (ref 0.5–1.9)

## 2023-06-25 MED ORDER — SODIUM CHLORIDE 0.9 % IV BOLUS
250.0000 mL | Freq: Once | INTRAVENOUS | Status: AC
  Administered 2023-06-25: 250 mL via INTRAVENOUS

## 2023-06-25 MED ORDER — ATORVASTATIN CALCIUM 40 MG PO TABS
80.0000 mg | ORAL_TABLET | Freq: Every day | ORAL | Status: DC
  Administered 2023-06-25 – 2023-06-29 (×5): 80 mg via ORAL
  Filled 2023-06-25 (×5): qty 2

## 2023-06-25 MED ORDER — SODIUM CHLORIDE 0.9 % IV SOLN
250.0000 mL | INTRAVENOUS | Status: AC
Start: 2023-06-25 — End: 2023-06-26

## 2023-06-25 MED ORDER — SODIUM CHLORIDE 0.9% FLUSH
10.0000 mL | Freq: Two times a day (BID) | INTRAVENOUS | Status: DC
  Administered 2023-06-25 – 2023-06-28 (×6): 10 mL
  Administered 2023-06-28: 20 mL
  Administered 2023-06-29 (×2): 10 mL

## 2023-06-25 MED ORDER — PANTOPRAZOLE SODIUM 40 MG PO TBEC
40.0000 mg | DELAYED_RELEASE_TABLET | Freq: Two times a day (BID) | ORAL | Status: DC
  Administered 2023-06-25 – 2023-06-29 (×10): 40 mg via ORAL
  Filled 2023-06-25 (×10): qty 1

## 2023-06-25 MED ORDER — NOREPINEPHRINE 4 MG/250ML-% IV SOLN
2.0000 ug/min | INTRAVENOUS | Status: DC
  Administered 2023-06-25: 2 ug/min via INTRAVENOUS
  Filled 2023-06-25: qty 250

## 2023-06-25 MED ORDER — SODIUM CHLORIDE 0.9% FLUSH
10.0000 mL | INTRAVENOUS | Status: DC | PRN
Start: 2023-06-25 — End: 2023-06-30

## 2023-06-25 MED ORDER — CHLORHEXIDINE GLUCONATE CLOTH 2 % EX PADS
6.0000 | MEDICATED_PAD | Freq: Every day | CUTANEOUS | Status: DC
Start: 2023-06-25 — End: 2023-06-30
  Administered 2023-06-25 – 2023-06-29 (×5): 6 via TOPICAL

## 2023-06-25 NOTE — ED Notes (Signed)
ED TO INPATIENT HANDOFF REPORT  ED Nurse Name and Phone #: Jacques Earthly Name/Age/Gender Leonard Cisneros 82 y.o. male Room/Bed: APA18/APA18  Code Status   Code Status: Full Code  Home/SNF/Other Home Patient oriented to: self, place, time, and situation Is this baseline? Yes   Triage Complete: Triage complete  Chief Complaint CHF (congestive heart failure) (HCC) [I50.9]  Triage Note C/o weight gain 10lbs in 1 week, weakness, shob and swelling to scrotum, abd, lower legs, and hands.  Denies cp Wife reports weeping to lower extremities.  Hx chf.  Recent blood transfusion on 10/14   Allergies Allergies  Allergen Reactions   Augmentin [Amoxicillin-Pot Clavulanate] Nausea And Vomiting    Level of Care/Admitting Diagnosis ED Disposition     ED Disposition  Admit   Condition  --   Comment  Hospital Area: [facility] [[id number]]  Level of Care: Telemetry [5]  Covid Evaluation: Confirmed COVID Negative  Diagnosis: CHF (congestive heart failure) [facility]) [[id number]]  Admitting Physician: Buena Irish [[id number]]  Attending Physician: Buena Irish [id number]  Certification:: I certify this patient will need inpatient services for at least 2 midnights  Expected Medical Readiness: 06/27/2023          B Medical/Surgery History Past Medical History:  Diagnosis Date   Aortic stenosis    Atrial flutter (HCC)    a. s/p remote ablation by Dr. Ladona Ridgel.   CAD (coronary artery disease)    a. s/p CABG 2003. b. 01/2016: unstable angina - s/p BMS to SVG-ramus intermediate, patent LIMA-mLAD, patent RIMA-dRCA.    CHF (congestive heart failure) (HCC)    Chronic lower back pain    Essential hypertension    Hyperlipidemia    Ischemic cardiomyopathy    a. Cath 01/2016 -  EF 50-55% with mild distal inferior hypocontractility.   Peripheral neuropathy 04/04/2014   Permanent atrial fibrillation [facility])    Past Surgical History:  Procedure Laterality Date   BACK SURGERY      BIOPSY  11/21/2022   Procedure: BIOPSY;  Surgeon: Napoleon Form, MD;  Location: [facility] ENDOSCOPY;  Service: Gastroenterology;;   BIOPSY  02/15/2023   Procedure: BIOPSY;  Surgeon: Lanelle Bal, DO;  Location: [facility] ENDO SUITE;  Service: Endoscopy;;   CARDIAC CATHETERIZATION  ~ 2000; 2003   CARDIAC CATHETERIZATION N/A 02/01/2016   Procedure: Left Heart Cath and Cors/Grafts Angiography;  Surgeon: Lennette Bihari, MD;  Location: [facility] INVASIVE CV LAB;  Service: Cardiovascular;  Laterality: N/A;   CARDIAC CATHETERIZATION N/A 02/01/2016   Procedure: Coronary Stent Intervention;  Surgeon: Lennette Bihari, MD;  Location: [facility] INVASIVE CV LAB;  Service: Cardiovascular;  Laterality: N/A;   CATARACT EXTRACTION W/PHACO Left 02/09/2019   Procedure: CATARACT EXTRACTION PHACO AND INTRAOCULAR LENS PLACEMENT (IOC);  Surgeon: Fabio Pierce, MD;  Location: [facility] ORS;  Service: Ophthalmology;  Laterality: Left;  CDE: 27.70   CATARACT EXTRACTION W/PHACO Right 02/23/2019   Procedure: CATARACT EXTRACTION PHACO AND INTRAOCULAR LENS PLACEMENT RIGHT EYE;  Surgeon: Fabio Pierce, MD;  Location: [facility] ORS;  Service: Ophthalmology;  Laterality: Right;  CDE: 10.15   COLONOSCOPY  06/04/2002   Normal colon and rectum   COLONOSCOPY  07/08/2012   Procedure: COLONOSCOPY;  Surgeon: Corbin Ade, MD;  Location: [facility] ENDO SUITE;  Service: Endoscopy;  Laterality: N/A;  11:30   COLONOSCOPY N/A 10/02/2017   Procedure: COLONOSCOPY;  Surgeon: Corbin Ade, MD;  Location: [facility] ENDO SUITE;  Service: Endoscopy;  Laterality: N/A;  10:30am   COLONOSCOPY WITH PROPOFOL N/A  03/13/2023   Procedure: COLONOSCOPY WITH PROPOFOL;  Surgeon: Lanelle Bal, DO;  Location: [facility] ENDO [facility]ITE;  Service: Endoscopy;  Laterality: N/A;  1215pm, asa 3   CORONARY ANGIOPLASTY WITH STENT PLACEMENT  02/01/2016   CORONARY ARTERY BYPASS GRAFT  2003   LIMA to LAD,LIMA to distal RCA,SVG to ramus intermediate vessel.   ESOPHAGOGASTRODUODENOSCOPY (EGD) WITH PROPOFOL N/A 11/21/2022    Procedure: ESOPHAGOGASTRODUODENOSCOPY (EGD) WITH PROPOFOL;  Surgeon: Napoleon Form, MD;  Location: [facility] ENDOSCOPY;  Service: Gastroenterology;  Laterality: N/A;   ESOPHAGOGASTRODUODENOSCOPY (EGD) WITH PROPOFOL N/A 12/27/2022   Procedure: ESOPHAGOGASTRODUODENOSCOPY (EGD) WITH PROPOFOL;  Surgeon: Lanelle Bal, DO;  Location: [facility];  Service: Endoscopy;  Laterality: N/A;   ESOPHAGOGASTRODUODENOSCOPY (EGD) WITH PROPOFOL N/A 02/15/2023   Procedure: ESOPHAGOGASTRODUODENOSCOPY (EGD) WITH PROPOFOL;  Surgeon: Lanelle Bal, DO;  Location: [facility];  Service: Endoscopy;  Laterality: N/A;   FEMORAL REVISION Right 03/30/2019   Procedure: Femoral revision right total hip arthroplasty;  Surgeon: Ollen Gross, MD;  Location: [facility] ORS;  Service: Orthopedics;  Laterality: Right;    FOREIGN BODY REMOVAL Right 04/02/2014   Procedure: FOREIGN BODY REMOVAL RIGHT FOOT;  Surgeon: Dalia Heading, MD;  Location: [facility] ORS;  Service: General;  Laterality: Right;   JOINT REPLACEMENT     JOINT REPLACEMENT     LUMBAR DISC SURGERY     POLYPECTOMY  03/13/2023   Procedure: POLYPECTOMY;  Surgeon: Lanelle Bal, DO;  Location: [facility];  Service: Endoscopy;;   REVISION TOTAL HIP ARTHROPLASTY Bilateral 2005-2012   right-left   RIGHT/LEFT HEART CATH AND CORONARY/GRAFT ANGIOGRAPHY N/A 09/27/2022   Procedure: RIGHT/LEFT HEART CATH AND CORONARY/GRAFT ANGIOGRAPHY;  Surgeon: [place], Peter M, MD;  Location: [facility] INVASIVE CV LAB;  Service: Cardiovascular;  Laterality: N/A;   SHOULDER OPEN ROTATOR CUFF REPAIR Right    TOTAL HIP ARTHROPLASTY Right 1999   TOTAL HIP ARTHROPLASTY Left 2008   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     A IV Location/Drains/Wounds Patient Lines/Drains/Airways Status     Active  Line/Drains/Airways     Name Placement date Placement time Site Days   Peripheral IV 06/24/23 20 G Right Antecubital 06/24/23  1900  Antecubital  1   Pressure Injury 02/14/23 Sacrum Mid Stage 2 -  Partial thickness loss of dermis presenting as a shallow open injury with a red, pink wound bed without slough. Pink, raw, painful 02/14/23  1532  -- 131   Wound / Incision (Open or Dehisced) 11/19/22 Non-pressure wound Pretibial Left;Proximal;Medial 11/19/22  0800  Pretibial  218   Wound / Incision (Open or Dehisced) 02/14/23 Non-pressure wound Pretibial Right weeping; open 02/14/23  1900  Pretibial  131   Wound / Incision (Open or Dehisced) 02/27/23 Non-pressure wound Pretibial Right the second and 3 rd anterior wound inferior to the first are the same size witn no granulation t 02/27/23  1305  Pretibial  118   Wound / Incision (Open or Dehisced) 02/27/23 Non-pressure wound Leg Right posterior wound 02/27/23  1330  Leg  118   Wound / Incision (Open or Dehisced) 03/31/23 Venous stasis ulcer Leg Right;Left 03/31/23  1600  Leg  86   Wound / Incision (Open or Dehisced) 04/04/23 Puncture Buttocks Right 04/04/23  0938  Buttocks  82  Intake/Output Last 24 hours  Intake/Output Summary (Last 24 hours) at 06/25/2023 0012 Last data filed at 06/24/2023 1959 Gross per 24 hour  Intake --  Output 450 ml  Net -450 ml    Labs/Imaging Results for orders placed or performed during the hospital encounter of 06/24/23 (from the past 48 hour(s))  Basic metabolic panel     Status: Abnormal   Collection Time: 06/24/23  2:35 PM  Result Value Ref Range   Sodium 132 (L) 135 - 145 mmol/L   Potassium 4.2 3.5 - 5.1 mmol/L   Chloride 98 98 - 111 mmol/L   CO2 27 22 - 32 mmol/L   Glucose, Bld 91 70 - 99 mg/dL    Comment: Glucose reference range applies only to samples taken after fasting for at least 8 hours.   BUN 34 (H) 8 - 23 mg/dL   Creatinine, Ser 4.33 (H) 0.61 - 1.24 mg/dL   Calcium 7.9 (L) 8.9 -  10.3 mg/dL   GFR, Estimated 46 (L) >60 mL/min    Comment: (NOTE) Calculated using the CKD-EPI Creatinine Equation (2021)    Anion gap 7 5 - 15    Comment: Performed at [facility], [address], [place], [place] [address]  CBC     Status: Abnormal   Collection Time: 06/24/23  2:35 PM  Result Value Ref Range   WBC 12.4 (H) 4.0 - 10.5 K/uL   RBC 2.19 (L) 4.22 - 5.81 MIL/uL   Hemoglobin 7.2 (L) 13.0 - 17.0 g/dL   HCT 84.1 (L) 66.0 - 63.0 %   MCV 110.5 (H) 80.0 - 100.0 fL   MCH 32.9 26.0 - 34.0 pg   MCHC 29.8 (L) 30.0 - 36.0 g/dL   RDW 16.0 (H) 10.9 - 32.3 %   Platelets 212 150 - 400 K/uL   nRBC 0.5 (H) 0.0 - 0.2 %    Comment: Performed at [facility], [address], [place], [place] [address]  Troponin I (High Sensitivity)     Status: None   Collection Time: 06/24/23  2:35 PM  Result Value Ref Range   Troponin I (High Sensitivity) 14 <18 ng/L    Comment: (NOTE) Elevated high sensitivity troponin I (hsTnI) values and significant  changes across serial measurements may suggest ACS but many other  chronic and acute conditions are known to elevate hsTnI results.  Refer to the "Links" section for chest pain algorithms and additional  guidance. Performed at [facility], [address], [place], [place] [address]   Brain natriuretic peptide     Status: Abnormal   Collection Time: 06/24/23  2:35 PM  Result Value Ref Range   B Natriuretic Peptide 847.0 (H) 0.0 - 100.0 pg/mL    Comment: Performed at [facility], [address] [place], [place] [address]  Troponin I (High Sensitivity)     Status: None   Collection Time: 06/24/23  5:03 PM  Result Value Ref Range   Troponin I (High Sensitivity) 14 <18 ng/L    Comment: (NOTE) Elevated high sensitivity troponin I (hsTnI) values and significant  changes across serial measurements may suggest ACS but many other  chronic and acute conditions are known to elevate hsTnI results.  Refer to the "Links" section for chest pain algorithms and  additional  guidance. Performed at [facility], [address], [place], [place] [address]   Type and screen     Status: None (Preliminary result)   Collection Time: 06/24/23  8:17 PM  Result Value Ref  Range   ABO/RH(D) O POS    Antibody Screen PENDING    Sample Expiration      06/27/2023,2359 Performed at [facility], [address], [place], [place] [address]    DG Chest 2 View  Result Date: 06/24/2023 CLINICAL DATA:  Shortness of breath. EXAM: CHEST - 2 VIEW COMPARISON:  03/31/2023. FINDINGS: There are bilateral small-to-moderate pleural effusions with associated compressive atelectatic changes, worsened since the prior study. There is moderate pulmonary vascular congestion/edema. Evaluation of cardiomediastinal silhouette is nondiagnostic due to bilateral lower hemithorax opacification. No acute osseous abnormalities. Sternotomy wires noted. The soft tissues are within normal limits. IMPRESSION: 1. Moderate congestive heart failure/pulmonary edema. 2. Small to moderate bilateral pleural effusions with associated compressive atelectatic changes, worsened since the prior study. Electronically Signed   By: Jules Schick M.D.   On: 06/24/2023 16:05    Pending Labs Unresulted Labs (From admission, onward)     Start     Ordered   06/25/23 0500  Basic metabolic panel  Tomorrow morning,   R        06/24/23 2233   06/25/23 0500  CBC  Tomorrow morning,   R        06/24/23 2233   06/24/23 2309  Prepare RBC (crossmatch)  (Blood Administration Adult)  Once,   R       Question Answer Comment  # of Units 1 unit   Transfusion Indications Hemoglobin 8 gm/dL or less and orthopedic or cardiac surgery or pre-existing cardiac condition   Number of Units to Keep Ahead NO units ahead   If emergent release call blood bank Not emergent release      06/24/23 2312   06/24/23 2236  Occult blood card to lab, stool RN will collect  ONCE - STAT,   STAT       Question:  Specimen to be collected by:  Answer:   RN will collect   06/24/23 2236            Vitals/Pain Today's Vitals   06/24/23 2315 06/24/23 2330 06/24/23 2345 06/25/23 0000  BP: (!) 103/48 (!) 85/57 (!) 88/55 (!) 88/56  Pulse: 98 100 85 99  Resp: 20 (!) 21 (!) 22 18  Temp:      TempSrc:      SpO2: 100% 100% 100% 94%  Weight:      PainSc:        Isolation Precautions No active isolations  Medications Medications  acetaminophen (TYLENOL) tablet 500 mg (has no administration in time range)    Or  acetaminophen (TYLENOL) suppository 650 mg (has no administration in time range)  ondansetron (ZOFRAN) tablet 4 mg (has no administration in time range)    Or  ondansetron (ZOFRAN) injection 4 mg (has no administration in time range)  sorbitol 70 % solution 30 mL (has no administration in time range)  furosemide (LASIX) injection 60 mg (has no administration in time range)  0.9 %  sodium chloride infusion (Manually program via Guardrails IV Fluids) (has no administration in time range)  acetaminophen (TYLENOL) tablet 650 mg (has no administration in time range)  furosemide (LASIX) injection 80 mg (80 mg Intravenous Given 06/24/23 2004)    Mobility      Focused Assessments    R Recommendations: See Admitting Provider Note  Report given to:   Additional Notes: A&O; Continent with urinal; 20G RAC; Blood products have to come from [facility] due to antibodies

## 2023-06-25 NOTE — Consult Note (Addendum)
Cardiology Consultation   Patient ID: Leonard Cisneros MRN: [id number]; DOB: 1940/12/02  Admit date: 06/06/2023 Date of Consult: 06/25/2023  PCP:  [name], FNP   [facility] Providers Cardiologist:  [name], MD  Electrophysiologist:  Lanier Prude, MD       Patient Profile:   Leonard Cisneros is a 82 y.o. male with a hx of   with past medical history of CAD (s/p CABG in 2003 with cath in 2017 showing patent LIMA-LAD, patent RIMA-dRCA, and 99% stenosis of SVG-RI treated with BMS, low-risk NST in 10/2018 and 11/2021, cath in 09/2022 showing patent LIMA-LAD and RIMA-PDA with occluded SVG-RI which appeared to be chronic and med management recommended), permanent atrial fibrillation, chronic HFrEF (EF 30-35% by echo in 03/2023), aortic stenosis (mild to moderate by cath in 09/2022), HTN and HLDwho is being seen 06/25/2023 for the evaluation of Acute CHF at the request of Dr. Blake Divine.  History of Present Illness:   Leonard Cisneros with above PMHx He experienced a prolonged hospitalization from 7/28 - 04/14/2023 for an acute CHF exacerbation and repeat echocardiogram showed that his EF was reduced at 30 to 35% with global hypokinesis, mild AS and mild MR/MS. He responded well to IV Lasix and Metolazone during admission. Troponin values did peak at 2438 but he did not undergo repeat cardiac catheterization given his anemia. His hemoglobin had been as low as 6.2 on admission with Hemoccult positive and he had recently undergone EGD which showed gastritis and nonbleeding ulcer and colonoscopy had shown nonbleeding hemorrhoids. Bone marrow biopsy -Myelodysplastic syndrome. F/u with fluid overload and torsemide increased.   Patient was on my schedule today for f/u but came in yesterday with worsening CHF and mild hypotension. He says he was just very weak and fatigued and knew something was wrong. Poor historian but says only a little SOB. Denies chest pain. Chronic  edema.   Past Medical History:  Diagnosis Date   Aortic stenosis    Atrial flutter (HCC)    a. s/p remote ablation by Dr. Ladona Ridgel.   CAD (coronary artery disease)    a. s/p CABG 2003. b. 01/2016: unstable angina - s/p BMS to SVG-ramus intermediate, patent LIMA-mLAD, patent RIMA-dRCA.    CHF (congestive heart failure) (HCC)    Chronic lower back pain    Essential hypertension    Hyperlipidemia    Ischemic cardiomyopathy    a. Cath 01/2016 -  EF 50-55% with mild distal inferior hypocontractility.   Peripheral neuropathy 04/04/2014   Permanent atrial fibrillation [facility])     Past Surgical History:  Procedure Laterality Date   BACK SURGERY     BIOPSY  11/21/2022   Procedure: BIOPSY;  Surgeon: Napoleon Form, MD;  Location: [facility] ENDOSCOPY;  Service: Gastroenterology;;   BIOPSY  02/15/2023   Procedure: BIOPSY;  Surgeon: Lanelle Bal, DO;  Location: [facility] ENDO SUITE;  Service: Endoscopy;;   CARDIAC CATHETERIZATION  ~ 2000; 2003   CARDIAC CATHETERIZATION N/A 02/01/2016   Procedure: Left Heart Cath and Cors/Grafts Angiography;  Surgeon: Lennette Bihari, MD;  Location: [facility] INVASIVE CV LAB;  Service: Cardiovascular;  Laterality: N/A;   CARDIAC CATHETERIZATION N/A 02/01/2016   Procedure: Coronary Stent Intervention;  Surgeon: Lennette Bihari, MD;  Location: [facility] INVASIVE CV LAB;  Service: Cardiovascular;  Laterality: N/A;   CATARACT EXTRACTION W/PHACO Left 02/09/2019   Procedure: CATARACT EXTRACTION PHACO AND INTRAOCULAR LENS PLACEMENT (IOC);  Surgeon: Fabio Pierce, MD;  Location: [facility] ORS;  Service: Ophthalmology;  Laterality: Left;  CDE: 27.70   CATARACT EXTRACTION W/PHACO Right 02/23/2019   Procedure: CATARACT EXTRACTION PHACO AND INTRAOCULAR LENS PLACEMENT RIGHT EYE;  Surgeon: Fabio Pierce, MD;  Location: [facility] ORS;  Service: Ophthalmology;  Laterality: Right;  CDE: 10.15   COLONOSCOPY  06/04/2002   Normal colon and rectum   COLONOSCOPY  07/08/2012   Procedure: COLONOSCOPY;  Surgeon: Corbin Ade,  MD;  Location: [facility] ENDO SUITE;  Service: Endoscopy;  Laterality: N/A;  11:30   COLONOSCOPY N/A 10/02/2017   Procedure: COLONOSCOPY;  Surgeon: Corbin Ade, MD;  Location: [facility] ENDO SUITE;  Service: Endoscopy;  Laterality: N/A;  10:30am   COLONOSCOPY WITH PROPOFOL N/A 03/13/2023   Procedure: COLONOSCOPY WITH PROPOFOL;  Surgeon: Lanelle Bal, DO;  Location: [facility] ENDO SUITE;  Service: Endoscopy;  Laterality: N/A;  1215pm, asa 3   CORONARY ANGIOPLASTY WITH STENT PLACEMENT  02/01/2016   CORONARY ARTERY BYPASS GRAFT  2003   LIMA to LAD,LIMA to distal RCA,SVG to ramus intermediate vessel.   ESOPHAGOGASTRODUODENOSCOPY (EGD) WITH PROPOFOL N/A 11/21/2022   Procedure: ESOPHAGOGASTRODUODENOSCOPY (EGD) WITH PROPOFOL;  Surgeon: Napoleon Form, MD;  Location: [facility] ENDOSCOPY;  Service: Gastroenterology;  Laterality: N/A;   ESOPHAGOGASTRODUODENOSCOPY (EGD) WITH PROPOFOL N/A 12/27/2022   Procedure: ESOPHAGOGASTRODUODENOSCOPY (EGD) WITH PROPOFOL;  Surgeon: Lanelle Bal, DO;  Location: [facility] ENDO SUITE;  Service: Endoscopy;  Laterality: N/A;   ESOPHAGOGASTRODUODENOSCOPY (EGD) WITH PROPOFOL N/A 02/15/2023   Procedure: ESOPHAGOGASTRODUODENOSCOPY (EGD) WITH PROPOFOL;  Surgeon: Lanelle Bal, DO;  Location: [facility] END[facility]ITE;  Service: Endoscopy;  Laterality: N/A;   FEMORAL REVISION Right 03/30/2019   Procedure: Femoral revision right total hip arthroplasty;  Surgeon: Ollen Gross, MD;  Location: [facility] ORS;  Service: Orthopedics;  Laterality: Right;    FOREIGN BODY REMOVAL Right 04/02/2014   Procedure: FOREIGN BODY REMOVAL RIGHT FOOT;  Surgeon: Dalia Heading, MD;  Location: [facility] ORS;  Service: General;  Laterality: Right;   JOINT REPLACEMENT     JOINT REPLACEMENT     LUMBAR DISC SURGERY     POLYPECTOMY  03/13/2023   Procedure: POLYPECTOMY;  Surgeon: Lanelle Bal, DO;  Location: [facility] ENDO SUITE;  Service: Endoscopy;;   REVISION TOTAL HIP ARTHROPLASTY Bilateral 2005-2012   right-left   RIGHT/LEFT HEART CATH  AND CORONARY/GRAFT ANGIOGRAPHY N/A 09/27/2022   Procedure: RIGHT/LEFT HEART CATH AND CORONARY/GRAFT ANGIOGRAPHY;  Surgeon: [place], Peter M, MD;  Location: [facility] INVASIVE CV LAB;  Service: Cardiovascular;  Laterality: N/A;   SHOULDER OPEN ROTATOR CUFF REPAIR Right    TOTAL HIP ARTHROPLASTY Right 1999   TOTAL HIP ARTHROPLASTY Left 2008   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     Home Medications:  Prior to Admission medications   Medication Sig Start Date End Date Taking? Authorizing Provider  acetaminophen (TYLENOL) 325 MG tablet Take 2 tablets (650 mg total) by mouth every 6 (six) hours as needed for mild pain (or Fever >/= 101). 02/16/23  Yes Emokpae, Courage, MD  atorvastatin (LIPITOR) 80 MG tablet TAKE 1 TABLET BY MOUTH EVERY DAY 06/14/23   [name], MD  dapagliflozin propanediol (FARXIGA) 10 MG TABS tablet Take 10 mg by mouth daily.    [provider]  Iron, Ferrous Sulfate, 325 (65 Fe) MG TABS Take 325 mg by mouth daily. 06/19/2023   Carlean Jews, NP  KLOR-CON M10  10 MEQ tablet Take 30 mEq by mouth daily. Patient not taking: Reported on 06/14/2023 05/22/23   [provider]  metoprolol succinate (TOPROL-XL) 25 MG 24 hr tablet Take 1 tablet (25 mg total) by mouth daily. 04/15/23 06/14/23  Arrien, York Ram, MD  nitroGLYCERIN (NITROSTAT) 0.4 MG SL tablet Place 1 tablet (0.4 mg total) under the tongue every 5 (five) minutes x 3 doses as needed for chest pain (if no relief after 3rd dose, proceed to the ED for an evaluation or call 911). Patient not taking: Reported on 06/14/2023 09/05/22   [name], MD  pantoprazole (PROTONIX) 40 MG tablet TAKE 1 TABLET BY MOUTH TWICE A DAY 05/27/23   [name], NP  Potassium Chloride ER 20 MEQ TBCR TAKE 1 TABLET BY MOUTH EVERY  DAY 06/03/23   [name], MD  spironolactone (ALDACTONE) 25 MG tablet Take 0.5 tablets (12.5 mg total) by mouth daily. 05/09/23   [name], MD  torsemide (DEMADEX) 20 MG tablet Take 3 tablets (60 mg total) by mouth daily. Patient taking differently: Take 40 mg by mouth 2 (two) times daily. 05/24/23 08/22/23  [name], PA-C    Inpatient Medications: Scheduled Meds:  sodium chloride   Intravenous Once   acetaminophen  650 mg Oral Once   Chlorhexidine Gluconate Cloth  6 each Topical Daily   furosemide  60 mg Intravenous BID   Continuous Infusions:  PRN Meds: acetaminophen **OR** acetaminophen, ondansetron **OR** ondansetron (ZOFRAN) IV, sorbitol  Allergies:    Allergies  Allergen Reactions   Augmentin [Amoxicillin-Pot Clavulanate] Nausea And Vomiting    Social History:   Social History   Socioeconomic History   Marital status: Married    Spouse name: Larita Fife   Number of children: 2   Years of education: Not on file   Highest education level: Bachelor's degree (e.g., BA, AB, BS)  Occupational History   Occupation: PT Holiday representative: RETIRED  Tobacco Use   Smoking status: Former    Current packs/day: 0.00    Average packs/day: 0.5 packs/day for 2.0 years (1.0 ttl pk-yrs)    Types: Cigarettes    Start date: 09/03/1964    Quit date: 09/03/1966    Years since quitting: 56.8   Smokeless tobacco: Never  Vaping Use   Vaping status: Never Used  Substance and Sexual Activity   Alcohol use: Yes    Alcohol/week: 11.0 standard drinks of alcohol    Types: 7 Glasses of wine, 4 Cans of beer per week    Comment: a glass of wine with dinner/ beer on the weekend   Drug use: No   Sexual activity: Not on file  Other Topics Concern   Not on file  Social History Narrative   Lives with his wife.   Social Determinants of Health   Financial Resource Strain: Low Risk  (04/03/2023)   Overall Financial Resource Strain (CARDIA)    Difficulty of  Paying Living Expenses: Not very hard  Food Insecurity: No Food Insecurity (04/03/2023)   Hunger Vital Sign    Worried About Running Out of Food in the Last Year: Never true    Ran Out of Food in the Last Year: Never true  Transportation Needs: No Transportation Needs (04/03/2023)   PRAPARE - Administrator, Civil Service (Medical): No    Lack of Transportation (Non-Medical): No  Physical Activity: Unknown (11/25/2022)   Exercise Vital Sign    Days of Exercise  per Week: 0 days    Minutes of Exercise per Session: Not on file  Stress: No Stress Concern Present (01/04/2023)   [employer] of Occupational Health - Occupational Stress Questionnaire    Feeling of Stress : Not at all  Social Connections: Moderately Integrated (11/25/2022)   Social Connection and Isolation Panel [NHANES]    Frequency of Communication with Friends and Family: Once a week    Frequency of Social Gatherings with Friends and Family: Once a week    Attends Religious Services: More than 4 times per year    Active Member of [employer] or Organizations: Yes    Attends Engineer, structural: More than 4 times per year    Marital Status: Married  Catering manager Violence: Not At Risk (04/05/2023)   Humiliation, Afraid, Rape, and Kick questionnaire    Fear of Current or Ex-Partner: No    Emotionally Abused: No    Physically Abused: No    Sexually Abused: No    Family History:     Family History  Problem Relation Age of Onset   Heart attack Brother    Colon cancer Neg Hx      ROS:  Please see the history of present illness.  ROS  All other ROS reviewed and negative.     Physical Exam/Data:   Vitals:   06/25/23 0930 06/25/23 0945 06/25/23 1000 06/25/23 1015  BP: (!) 89/57 (!) 84/61 (!) 88/56 (!) 80/54  Pulse: 100 (!) 111 (!) 111 (!) 109  Resp: 20 18 19  (!) 23  Temp:      TempSrc:      SpO2: 100% 100% 99% 100%  Weight:        Intake/Output Summary (Last 24 hours) at 06/25/2023 1103 Last  data filed at 06/25/2023 1031 Gross per 24 hour  Intake 970 ml  Output 1525 ml  Net -555 ml      06/27/2023    1:22 PM 06/17/2023    8:53 AM 06/14/2023   10:17 AM  Last 3 Weights  Weight (lbs) 209 lb 211 lb 205 lb 14.6 oz  Weight (kg) 94.802 kg 95.709 kg 93.4 kg     Body mass index is 29.15 kg/m.  General:  Well nourished, well developed, in no acute distress  HEENT: normal Neck: no JVD Vascular: No carotid bruits; Distal pulses 2+ bilaterally Cardiac:  normal S1, S2; irreg irreg 2-3/6 systolic murmur LSB Lungs:  decreased breath sounds but no rales Abd: anasarca and scrotal edema Ext: plus 4  edema, erythema, weeping legs, right leg wrapped Musculoskeletal:  No deformities, BUE and BLE strength normal and equal Skin: warm and dry LE weeping Neuro:  CNs 2-12 intact, no focal abnormalities noted Psych:  Normal affect   EKG:  The EKG was personally reviewed and demonstrates:  Afib 107/m lateral TWI no acute change Telemetry:  Telemetry was personally reviewed and demonstrates:  Afib 90-low 100's  Relevant CV Studies:   Echocardiogram: 03/2023 IMPRESSIONS     1. Left ventricular ejection fraction, by estimation, is 30 to 35%. The  left ventricle has moderately decreased function. The left ventricle  demonstrates global hypokinesis. The left ventricular internal cavity size  was mildly dilated. There is mild  concentric left ventricular hypertrophy. Left ventricular diastolic  parameters are indeterminate.   2. Right ventricular systolic function is mildly reduced. The right  ventricular size is mildly enlarged. Tricuspid regurgitation signal is  inadequate for assessing PA pressure.   3. Left atrial size was  severely dilated.   4. Right atrial size was moderately dilated.   5. The mitral valve is normal in structure. Mild mitral valve  regurgitation. Mild mitral stenosis. The mean mitral valve gradient is 6.0  mmHg. Moderate mitral annular calcification.   6. The  aortic valve is tricuspid. There is moderate calcification of the  aortic valve. Aortic valve regurgitation is not visualized. Mild aortic  valve stenosis. Aortic valve mean gradient measures 14.0 mmHg.   7. The inferior vena cava is dilated in size with <50% respiratory  variability, suggesting right atrial pressure of 15 mmHg.  Laboratory Data:  High Sensitivity Troponin:   Recent Labs  Lab 06/21/2023 1435 07/02/2023 1703  TROPONINIHS 14 14     Chemistry Recent Labs  Lab 06/17/2023 1435 06/25/23 0305  NA 132* 133*  K 4.2 3.8  CL 98 99  CO2 27 26  GLUCOSE 91 90  BUN 34* 33*  CREATININE 1.52* 1.50*  CALCIUM 7.9* 7.7*  GFRNONAA 46* 46*  ANIONGAP 7 8    No results for input(s): "PROT", "ALBUMIN", "AST", "ALT", "ALKPHOS", "BILITOT" in the last 168 hours. Lipids No results for input(s): "CHOL", "TRIG", "HDL", "LABVLDL", "LDLCALC", "CHOLHDL" in the last 168 hours.  Hematology Recent Labs  Lab 07/04/2023 1435 06/25/23 0305  WBC 12.4* 12.4*  RBC 2.19* 1.94*  HGB 7.2* 6.5*  HCT 24.2* 21.3*  MCV 110.5* 109.8*  MCH 32.9 33.5  MCHC 29.8* 30.5  RDW 26.2* 26.4*  PLT 212 167   Thyroid No results for input(s): "TSH", "FREET4" in the last 168 hours.  BNP Recent Labs  Lab 06/17/2023 1435  BNP 847.0*    DDimer No results for input(s): "DDIMER" in the last 168 hours.   Radiology/Studies:  DG Chest 2 View  Result Date: 07/02/2023 CLINICAL DATA:  Shortness of breath. EXAM: CHEST - 2 VIEW COMPARISON:  03/31/2023. FINDINGS: There are bilateral small-to-moderate pleural effusions with associated compressive atelectatic changes, worsened since the prior study. There is moderate pulmonary vascular congestion/edema. Evaluation of cardiomediastinal silhouette is nondiagnostic due to bilateral lower hemithorax opacification. No acute osseous abnormalities. Sternotomy wires noted. The soft tissues are within normal limits. IMPRESSION: 1. Moderate congestive heart failure/pulmonary edema. 2.  Small to moderate bilateral pleural effusions with associated compressive atelectatic changes, worsened since the prior study. Electronically Signed   By: Jules Schick M.D.   On: 06/17/2023 16:05     Assessment and Plan:   Acute on Chronic HFrEF with anasarca/scrotal edema/LE edema -  ejection fraction was at 30 to 35% by most recent imaging and ischemic evaluation has not been pursued given severe anemia in the setting of MDS.  I/O's negative 1L after Lasix 80 mg x1 yest and now on 60 mg IV bid  -BP 87/58 currently and Toprol-XL 25 mg daily and Spironolactone 12.5 mg daily,   Farxiga 10 mg dail all on hold. -consider transfer to [facility] given advanced CHF and MDS.     CAD - s/p CABG in 2003 with cath in 2017 showing patent LIMA-LAD, patent RIMA-dRCA, and 99% stenosis of SVG-RI treated with BMS. Most recent cath in 09/2022 showed patent LIMA-LAD and RIMA-PDA with occluded SVG-RI which appeared to be chronic and med management was recommended.  - Further ischemic testing has not been pursued given his anemia.  - resume Atorvastatin 80 mg daily and continue to hold Toprol-XL 25 mg daily given hypotension. Hopefully BP will recover with transfusion. He is no longer on ASA.     Permanent Atrial Fibrillation -  He denies any recent palpitations and heart rate is in the 80's to 90's during today's visit. -resume Toprol-XL 25 mg daily for rate control when BP allows - He has been off anticoagulation given his anemia and was previously undergoing workup for Watchman device placement but this has been on hold as well given his inability to tolerate even short-term anticoagulation.      . Aortic Stenosis  -  was mild to moderate by catheterization 09/2022 and read as mild by echocardiogram in 03/2023. Continue to follow.      . MDS/Anemia - Followed by Hematology and currently receiving infusions every 2 weeks. Hgb was at 6.5 today . Has antibodies and has to receive blood from [place].      Risk Assessment/Risk Scores:        New York Heart Association (NYHA) Functional Class NYHA Class IV  CHA2DS2-VASc Score = 5   This indicates a 7.2% annual risk of stroke. The patient's score is based upon: CHF History: 1 HTN History: 1 Diabetes History: 0 Stroke History: 0 Vascular Disease History: 1 Age Score: 2 Gender Score: 0         For questions or updates, please contact [facility] Please consult [email or web address] for contact info under    Signed, Dina Rich, MD  06/25/2023 11:03 AM  Attending note  Patient seen and discussed with PA Geni Bers, I agree with her documentation. 82 yo male history of CAD with prior CABG in 2003, BMS to SVG-RI in 2017, cath Jan 2024 occluded SVG-RI managed medically. Permanent afib, chronic HFrEF LVEF 30-35% by 03/2023 echo, HTN, HLD, aortic stenosis, chronic anemia with recent eGD gastritis and nonbleeding ulcer, MDS, admitted with generalized weakness. Patient is a poor historian. In ER found to have recurrent anemia, admitted to primary team. Evidence of severe volume overload as well, cardiology consulted to help manage acute on chronic HFrEF.    K 4.2 Cr 1.52 BUN 34 WBC 12.4 Hgb 7.2 Plt 212 BNP 847  Trop 14-->14 EKG afib rate 107    1.Acute on chronic HFrEF - Jan 2024 echo: LVEF 55-60% - 03/2023 echo: LVEF 30-35%, mild RV dysfunction, severe LAE, mild MR, mild MS mean grad 6, mild AS mean grad 14, AVA VTI 2.13 DI 0.56 - ischemic testing not pursued at time of LVEF drop due to chronic anemia  - BNP 847, moderate pulm edema, small to moderate bilateral pleural effusions. 9/20 clinic weight 195 lbs,  admit weight documented 209 lbs. Significantly volume overloaded on exam.  - received IV lasix 80mg  last night, due for 60mg  bid today. Limited I/Os thus far, 1.2 L uop overnight. Continue IV diuresis at currnt dose, likely titrate pending bp's.  - medical therapy limited due to hypotension. Home toprol, aldactone on  hold. Hold farxiga as inpatient in case needs to be npo for procedure.    MAPs labile and low at times, currently in 70s - would recommend placing PICC line. He will require massive diuresis, may run into issues with recurrent hypotension. Could also monitor coox and CVPs as well.     2.Hypotension - labile bp's, SBPs have been in the 80s but not consistently., On my current eval SBP low 100s and MAP in 70s - WBC 12.4, Hgb 7.2-->6.5 though volume overloaded from HF - check procalcitonin, lactic acid, limited echo - would recommend placing picc line in case vasopressors are needed, will need massive diuresis and good chance may run into some progressive issues with hypotension.  3.CAD - prior CABG in 2003, BMS to SVG-RI in 2017, cath Jan 2024 occluded SVG-RI managed medically - admit 03/2023 with LVEF drop, trops up to 2438. No cath due to severe anemia, MDS during that admission.  - no acute issues this admission    4.Afib -not on anticoag due to chronic severe anemia - from prior evaluation not a watchman candidate, not able tolerate short term anticoag   5.MDS/Chronic anemia - Hgb down to 6.5 this admission - primary team planning for transfusion, agree keep Hgb 8 or higher  6. AKI - in setting of HF, perhaps venous congestion - some hypotension, risk for ischemic injury - follow with diuresis.    Dina Rich MD

## 2023-06-25 NOTE — ED Notes (Signed)
Spoke with Leonard Cisneros in regards to patient's BP. Per Leonard Cisneros, "a MAP of 60 is okay for this patient"

## 2023-06-25 NOTE — Progress Notes (Signed)
Echocardiogram 2D Echocardiogram has been performed.  Leonard Cisneros 06/25/2023, 5:10 PM

## 2023-06-25 NOTE — Plan of Care (Signed)

## 2023-06-25 NOTE — Plan of Care (Signed)

## 2023-06-25 NOTE — TOC Initial Note (Signed)
Transition of Care [facility]) - Initial/Assessment Note    Patient Details  Name: Leonard Cisneros MRN: [id number] Date of Birth: May 17, 1941  Transition of Care [facility]) CM/SW Contact:    Villa Herb, LCSWA Phone Number: 06/25/2023, 2:11 PM  Clinical Narrative:                 Pt is high risk for readmission. CSW met with pt at bedside to compelte assessment. Pt states that he lives with his wife. He is mostly independent but states he and his wife work together when needed. Pt normally drives but states his wife also can if he cannot. Pt has a cane and crutches to use when needed. Pt has not had HH in the past. TOC to follow.   Expected Discharge Plan: Home/Self Care Barriers to Discharge: Continued Medical Work up   Patient Goals and CMS Choice Patient states their goals for this hospitalization and ongoing recovery are:: return home CMS Medicare.gov Compare Post Acute Care list provided to:: Patient Choice offered to / list presented to : Patient      Expected Discharge Plan and Services In-house Referral: Clinical Social Work Discharge Planning Services: CM Consult   Living arrangements for the past 2 months: Single Family Home                                      Prior Living Arrangements/Services Living arrangements for the past 2 months: Single Family Home Lives with:: Spouse Patient language and need for interpreter reviewed:: Yes Do you feel safe going back to the place where you live?: Yes      Need for Family Participation in Patient Care: Yes (Comment) Care giver support system in place?: Yes (comment) Current home services: DME (crutches and cane) Criminal Activity/Legal Involvement Pertinent to Current Situation/Hospitalization: No - Comment as needed  Activities of Daily Living   ADL Screening (condition at time of admission) Independently performs ADLs?: Yes (appropriate for developmental age) Does the patient have a NEW difficulty with  bathing/dressing/toileting/self-feeding that is expected to last >3 days?: No Does the patient have a NEW difficulty with getting in/out of bed, walking, or climbing stairs that is expected to last >3 days?: No Does the patient have a NEW difficulty with communication that is expected to last >3 days?: No Is the patient deaf or have difficulty hearing?: No Does the patient have difficulty seeing, even when wearing glasses/contacts?: No Does the patient have difficulty concentrating, remembering, or making decisions?: No  Permission Sought/Granted                  Emotional Assessment Appearance:: Appears stated age Attitude/Demeanor/Rapport: Engaged Affect (typically observed): Accepting Orientation: : Oriented to Self, Oriented to Place, Oriented to  Time, Oriented to Situation Alcohol / Substance Use: Not Applicable Psych Involvement: No (comment)  Admission diagnosis:  CHF (congestive heart failure) (HCC) [I50.9] Systolic congestive heart failure, unspecified HF chronicity (HCC) [I50.20] Patient Active Problem List   Diagnosis Date Noted   CHF (congestive heart failure) (HCC) 06/16/2023   Hydrocele of testis 06/13/2023   Coronary artery disease 04/10/2023   MDS (myelodysplastic syndrome) (HCC) 04/09/2023   Demand ischemia (HCC) 04/03/2023   Atrial fibrillation (HCC) 04/03/2023   AKI (acute kidney injury) (HCC) 04/02/2023   Acute on chronic combined systolic and diastolic CHF (congestive heart failure) (HCC) 04/02/2023   Microcytic anemia 04/02/2023   History of duodenal  ulcer 04/01/2023   Anemia 04/01/2023   GIB (gastrointestinal bleeding) 03/31/2023   Acute exacerbation of CHF (congestive heart failure) (HCC) 02/14/2023   Elevated alkaline phosphatase level 02/14/2023   Symptomatic anemia 02/14/2023   Acute on chronic heart failure with preserved ejection fraction (HFpEF) (HCC) 02/14/2023   Pressure injury of skin 02/14/2023   Decubitus ulcer of buttock, right,  unstageable (HCC) 02/12/2023   Fatigue 02/12/2023   Stasis dermatitis of both legs 01/11/2023   Permanent atrial fibrillation (HCC) 12/27/2022   Contraindication to anticoagulation therapy 12/27/2022   Hemorrhagic shock (HCC) 12/26/2022   ABLA (acute blood loss anemia) 12/26/2022   Gastrointestinal hemorrhage 12/26/2022   Lactic acidosis 12/26/2022   Coronary artery disease involving native coronary artery of native heart without angina pectoris 12/26/2022   Hypovolemic shock (HCC) 12/26/2022   Duodenal ulcer 11/21/2022   Gastritis and gastroduodenitis 11/21/2022   Heme positive stool 11/20/2022   Melena 11/20/2022   Anemia due to chronic blood loss 11/20/2022   History of CAD (coronary artery disease) 11/19/2022   Elevated troponin 11/19/2022   SOB (shortness of breath) 11/18/2022   Acute respiratory failure with hypoxia (HCC) 09/27/2022   Nonrheumatic aortic valve stenosis 09/27/2022   Unstable angina (HCC) 09/27/2022   Acute heart failure (HCC) 09/24/2022   Hypokalemia 09/24/2022   Hyponatremia 09/24/2022   Abnormal liver enzymes 09/24/2022   Hyperbilirubinemia 09/24/2022   Volume overload 09/24/2022   Acquired thrombophilia (HCC) 09/24/2022   Severe anemia 09/24/2022   Cellulitis 11/12/2021   ETOH abuse 11/12/2021   Ambulatory dysfunction 11/12/2021   Severe pulmonary hypertension (HCC) 11/12/2021   Moderate aortic stenosis 11/12/2021   Systolic congestive heart failure (HCC) 11/12/2021   Lumbar post-laminectomy syndrome 06/15/2021   Chronic pain syndrome 06/15/2021   Lumbar pain 08/31/2020   Pain of left hip joint 08/03/2020   History of revision of total replacement of right hip joint 06/10/2019   Persistent atrial fibrillation (HCC) 04/09/2019   Atrial fibrillation with RVR (HCC) 04/08/2019   Acute on chronic diastolic congestive heart failure (HCC)    Macrocytic anemia    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   Chest pain 10/22/2018   Physical  deconditioning 04/26/2018   Unsteady gait 04/26/2018   Closed fracture of femur, greater trochanter (HCC) 04/25/2018   Pain in lower limb 04/24/2018   Fall 04/21/2018   Peripheral vascular disease (HCC) 04/21/2018   History of adenomatous polyp of colon 08/20/2017   Ischemic myocardial dysfunction 02/02/2016   Recurrent coronary arteriosclerosis after percutaneous transluminal coronary angioplasty 02/01/2016   Exercise-induced angina (HCC) 01/28/2016   Hyperlipidemia 01/28/2016   Neuropathic pain 06/13/2015   Mild obesity 04/20/2015   Essential hypertension 04/20/2015   Disorder of peripheral nervous system 04/04/2014   Foreign body (FB) in soft tissue 03/31/2014   Disorder of glucose metabolism (HCC) 03/31/2014   Obesity with body mass index 30 or greater 03/31/2014   History of coronary artery bypass graft 01/04/2014   Tear of left rotator cuff 10/22/2013   Rotator cuff syndrome of left shoulder 10/22/2013   Long term (current) use of anticoagulants 06/18/2013   Encounter for general adult medical examination without abnormal findings 06/18/2012   High risk medication use 06/18/2012   PCP:  [name], FNP Pharmacy:   [name] [phone] - [place], [place] - [address] AT [facility] [address] [place] [address] Phone: [phone] Fax: [phone]     Social Determinants of Health (SDOH) Social History: SDOH Screenings   Food Insecurity:  No Food Insecurity (06/25/2023)  Housing: Low Risk  (06/25/2023)  Transportation Needs: No Transportation Needs (06/25/2023)  Utilities: Not At Risk (06/25/2023)  Alcohol Screen: Low Risk  (04/03/2023)  Depression (PHQ2-9): Low Risk  (02/12/2023)  Financial Resource Strain: Low Risk  (04/03/2023)  Physical Activity: Unknown (11/25/2022)  Social Connections: Moderately Integrated (11/25/2022)  Stress: No Stress Concern Present (01/04/2023)  Tobacco Use: Medium Risk (06/25/2023)   SDOH Interventions:      Readmission Risk Interventions    06/25/2023    2:09 PM 12/26/2022   10:54 AM 09/25/2022   12:42 PM  Readmission Risk Prevention Plan  Transportation Screening Complete Complete Complete  PCP or Specialist Appt within 5-7 Days   Not Complete  Home Care Screening   Complete  Medication Review (RN CM)   Complete  HRI or Home Care Consult  Complete   Social Work Consult for Recovery Care Planning/Counseling  Complete   Palliative Care Screening  Not Applicable   Medication Review Oceanographer) Complete Complete   HRI or Home Care Consult Complete    SW Recovery Care/Counseling Consult Complete    Palliative Care Screening Not Applicable    Skilled Nursing Facility Not Applicable

## 2023-06-25 NOTE — ED Notes (Signed)
Lab notified this RN that there will be a delay in his blood products being ready for transfusion due to antibodies and the blood needing to be sent to [facility]. MD notified

## 2023-06-25 NOTE — Progress Notes (Signed)
Peripherally Inserted Central Catheter Placement  The IV Nurse has discussed with the patient and/or persons authorized to consent for the patient, the purpose of this procedure and the potential benefits and risks involved with this procedure.  The benefits include less needle sticks, lab draws from the catheter, and the patient may be discharged home with the catheter. Risks include, but not limited to, infection, bleeding, blood clot (thrombus formation), and puncture of an artery; nerve damage and irregular heartbeat and possibility to perform a PICC exchange if needed/ordered by physician.  Alternatives to this procedure were also discussed.  Bard Power PICC patient education guide, fact sheet on infection prevention and patient information card has been provided to patient /or left at bedside.    PICC Placement Documentation  PICC Double Lumen 06/25/23 Right Basilic 40 cm 0 cm (Active)  Indication for Insertion or Continuance of Line Vasoactive infusions 06/25/23 1819  Exposed Catheter (cm) 0 cm 06/25/23 1819  Site Assessment Clean, Dry, Intact 06/25/23 1819  Lumen #1 Status Flushed;Saline locked;Blood return noted 06/25/23 1819  Lumen #2 Status Flushed;Saline locked;Blood return noted 06/25/23 1819  Dressing Type Transparent;Securing device 06/25/23 1819  Dressing Status Antimicrobial disc in place 06/25/23 1819  Line Care Connections checked and tightened 06/25/23 1819  Line Adjustment (NICU/IV Team Only) No 06/25/23 1819  Dressing Intervention New dressing 06/25/23 1819  Dressing Change Due 07/02/23 06/25/23 1819       Vernona Rieger  Ernan Runkles 06/25/2023, 6:19 PM

## 2023-06-25 NOTE — Consult Note (Addendum)
WOC Nurse Consult Note: patient states he has home health that comes out and places wraps on legs weekly  Reason for Consult: weeping legs  Wound type: full thickness r/t venous insufficiency  Pressure Injury POA: NA  Measurement: patient with scattered ulcerations bilateral lower legs; largest 1 cm x 1 cm R medial lower leg; 1 cm x 1 cm L lateral lower leg, 2 noted around L lateral ankle 0.5 cm x 0.5 cm that are weeping  Wound bed: 50% yellow 50% pink and moist; some ulcers are 100% yellow dry  Drainage (amount, consistency, odor) minimal serous fluid noted at this visit  Periwound: edematous, patient with chronic changes consistent with venous insufficiency  Dressing procedure/placement/frequency: Clean bilateral legs with soap and water, apply silver hydrofiber (Aquacel [place] [phone]) cut to fit ulcer wound beds, cover with ABD pads and Kerlix roll gauze starting just above toes and ending right below knees. Secure above dressing with Ace bandage wrapped in same fashion as Kerlix for light compression.   POC discussed with patient. Patient should resume previous compression wraps with home health at discharge.   WOC team will not follow. Re-consult if further needs arise.   Thanks,  [employer] MSN, RN-BC, [employer] [phone]

## 2023-06-25 NOTE — Progress Notes (Signed)
[facility]                                                                               Leonard Cisneros, is a 82 y.o. male, DOB - 1941/04/16, ONG:[id number] Admit date - 06/07/2023    Outpatient Primary MD for the patient is [name], FNP  LOS - 1  days    Brief summary   Leonard Cisneros is a 82 y.o. male with medical history significant of EF=30% months ago, CAD, h/o CABG, afib, mild AS and mild Mitral stenosis.  His last cath was 09/2022., h/o myelodysplastic syndrome after a bone marrow biopsy in July 2024  and on Aranesp 500 mcg every 2 weeks, but also has history of duodenal ulcer,  internal hemorrhoids, presents with generalized weakness, sob.    CXR showing Moderate congestive heart failure/pulmonary edema. Small to moderate bilateral pleural effusions with associated compressive atelectatic changes, worsened since the prior study.      Assessment & Plan    Assessment and Plan:    Acute Severe systolic CHF on chronic HFrEF with anasarca and lower extremity edema.  Lst echo shows LVEF of 30 to 35%.  Started on IV lasix 40 mg BID.  BP parameters are borderline and MAP's ARE around 60's.  Pt on toprol and spironolactone.  Cardiology consulted and recommendations given.  Troponins negative. EKG shows     CAD S/P CABG in 2003.  Cath in 09/2022 showed patent LIMA-LAD and RIMA-PDA with occluded SVG-RI which appeared to be chronic and med management was recommended.  Further ischemic testing not done due to severe anemia.  Resume statin.    Permanent Atrial fibrillation  Not on anti coagulation due to h/o GI bleed and MDS and symptomatic anemia.  Restart pt on toprol 25 mg once BP improves.   Hyponatremia: fluid overload.   MILD AKI Baseline creatinine around 1, current at 1.5 probably from CHF. / cardio renal syndrome.     Leukocytosis:  Unclear etiology.     MDS explaining his anemia:  Hemoglobin around 6.5 this morning.  1 unit  of prbc transfusion ordered.  Check iron levels, folic acid levels.   H/o PUD; On protonix, continue the same.     Estimated body mass index is 28.93 kg/m as calculated from the following:   Height as of this encounter: 5\' 11"  (1.803 m).   Weight as of this encounter: 94.1 kg.  Code Status: FULL CODE. DVT Prophylaxis:  SCD'S   Level of Care: Level of care: Stepdown Family Communication: none at bedside.   Disposition Plan:     Remains inpatient appropriate:  IV lasix.   Procedures:  None.   Consultants:   Cardiology.  Palliative care.   Antimicrobials:   Anti-infectives (From admission, onward)    None        Medications  Scheduled Meds:  sodium chloride   Intravenous Once   acetaminophen  650 mg Oral Once   Chlorhexidine Gluconate Cloth  6 each Topical Daily   furosemide  60 mg Intravenous BID   Continuous Infusions: PRN Meds:.acetaminophen **OR** acetaminophen, ondansetron **OR** ondansetron (ZOFRAN) IV, sorbitol    Subjective:   Leonard Cisneros was seen and examined today.  confused, no new complaints.   Objective:   Vitals:   06/25/23 1200 06/25/23 1305 06/25/23 1330 06/25/23 1400  BP: (!) 101/58 (!) 94/54 (!) 84/39 (!) 85/50  Pulse: (!) 106 99 (!) 55 97  Resp: (!) 29 (!) 26 20 20   Temp:      TempSrc:      SpO2: 100% 100% 100% 100%  Weight:      Height:        Intake/Output Summary (Last 24 hours) at 06/25/2023 1406 Last data filed at 06/25/2023 1031 Gross per 24 hour  Intake 970 ml  Output 1525 ml  Net -555 ml   Filed Weights   07-08-23 1322 06/25/23 1112  Weight: 94.8 kg 94.1 kg     Exam General exam: ill appearing elderly gentleman, not in distress  Respiratory system: diminished at bases, on [place] oxygen at 2 lit/min.  Cardiovascular system: S1 & S2 heard, RRR.  Gastrointestinal system: Abdomen is nondistended, soft and nontender.  Central nervous system: Alert and oriented to person only, not able to provide much  information.  Extremities: bilateral leg edema 3+ , right lower extremity in cast/ bandaged, weeping lower extremity.  Skin: No rashes Psychiatry: mood is okay.    Data Reviewed:  I have personally reviewed following labs and imaging studies   CBC Lab Results  Component Value Date   WBC 12.4 (H) 06/25/2023   RBC 1.94 (L) 06/25/2023   HGB 6.5 (LL) 06/25/2023   HCT 21.3 (L) 06/25/2023   MCV 109.8 (H) 06/25/2023   MCH 33.5 06/25/2023   PLT 167 06/25/2023   MCHC 30.5 06/25/2023   RDW 26.4 (H) 06/25/2023   LYMPHSABS 0.8 06/17/2023   MONOABS 0.8 06/17/2023   EOSABS 0.2 06/17/2023   BASOSABS 0.0 06/17/2023     Last metabolic panel Lab Results  Component Value Date   NA 133 (L) 06/25/2023   K 3.8 06/25/2023   CL 99 06/25/2023   CO2 26 06/25/2023   BUN 33 (H) 06/25/2023   CREATININE 1.50 (H) 06/25/2023   GLUCOSE 90 06/25/2023   GFRNONAA 46 (L) 06/25/2023   GFRAA 97 07/19/2020   CALCIUM 7.7 (L) 06/25/2023   PHOS 3.9 04/08/2023   PROT 5.5 (L) 06/25/2023   ALBUMIN 2.4 (L) 06/25/2023   LABGLOB 2.6 04/03/2023   AGRATIO 1.4 11/29/2022   BILITOT 3.0 (H) 06/25/2023   ALKPHOS 338 (H) 06/25/2023   AST 28 06/25/2023   ALT 25 06/25/2023   ANIONGAP 8 06/25/2023    CBG (last 3)  No results for input(s): "GLUCAP" in the last 72 hours.    Coagulation Profile: No results for input(s): "INR", "PROTIME" in the last 168 hours.   Radiology Studies: [place] EKG SITE RITE  Result Date: 06/25/2023 If Site Rite image not attached, placement could not be confirmed due to current cardiac rhythm.  DG Chest 2 View  Result Date: 2023-07-08 CLINICAL DATA:  Shortness of breath. EXAM: CHEST - 2 VIEW COMPARISON:  03/31/2023. FINDINGS: There are bilateral small-to-moderate pleural effusions with associated compressive atelectatic changes, worsened since the prior study. There is moderate pulmonary vascular congestion/edema. Evaluation of cardiomediastinal silhouette is nondiagnostic due to  bilateral lower hemithorax opacification. No acute osseous abnormalities. Sternotomy wires noted. The soft tissues are within normal limits. IMPRESSION: 1. Moderate congestive heart failure/pulmonary edema. 2. Small to moderate bilateral pleural effusions with associated compressive atelectatic changes, worsened since the prior study. Electronically Signed   By: Timoteo Expose.D.  On: 06/22/2023 16:05       Kathlen Mody M.D. [facility] 06/25/2023, 2:06 PM  Available via Epic secure chat 7am-7pm After 7 pm, please refer to night coverage provider listed on amion.

## 2023-06-26 ENCOUNTER — Encounter: Payer: Self-pay | Admitting: Hematology

## 2023-06-26 DIAGNOSIS — Z7189 Other specified counseling: Secondary | ICD-10-CM

## 2023-06-26 DIAGNOSIS — D649 Anemia, unspecified: Secondary | ICD-10-CM

## 2023-06-26 DIAGNOSIS — Z66 Do not resuscitate: Secondary | ICD-10-CM

## 2023-06-26 DIAGNOSIS — I5023 Acute on chronic systolic (congestive) heart failure: Secondary | ICD-10-CM | POA: Diagnosis not present

## 2023-06-26 DIAGNOSIS — I5021 Acute systolic (congestive) heart failure: Secondary | ICD-10-CM | POA: Diagnosis not present

## 2023-06-26 DIAGNOSIS — I872 Venous insufficiency (chronic) (peripheral): Secondary | ICD-10-CM

## 2023-06-26 DIAGNOSIS — Z515 Encounter for palliative care: Secondary | ICD-10-CM

## 2023-06-26 DIAGNOSIS — I5020 Unspecified systolic (congestive) heart failure: Secondary | ICD-10-CM

## 2023-06-26 DIAGNOSIS — I10 Essential (primary) hypertension: Secondary | ICD-10-CM

## 2023-06-26 DIAGNOSIS — N179 Acute kidney failure, unspecified: Secondary | ICD-10-CM | POA: Diagnosis not present

## 2023-06-26 LAB — COMPREHENSIVE METABOLIC PANEL
ALT: 22 U/L (ref 0–44)
AST: 21 U/L (ref 15–41)
Albumin: 2.3 g/dL — ABNORMAL LOW (ref 3.5–5.0)
Alkaline Phosphatase: 316 U/L — ABNORMAL HIGH (ref 38–126)
Anion gap: 7 (ref 5–15)
BUN: 36 mg/dL — ABNORMAL HIGH (ref 8–23)
CO2: 27 mmol/L (ref 22–32)
Calcium: 7.5 mg/dL — ABNORMAL LOW (ref 8.9–10.3)
Chloride: 100 mmol/L (ref 98–111)
Creatinine, Ser: 1.52 mg/dL — ABNORMAL HIGH (ref 0.61–1.24)
GFR, Estimated: 46 mL/min — ABNORMAL LOW (ref >60)
Glucose, Bld: 129 mg/dL — ABNORMAL HIGH (ref 70–99)
Potassium: 3.4 mmol/L — ABNORMAL LOW (ref 3.5–5.1)
Sodium: 134 mmol/L — ABNORMAL LOW (ref 135–145)
Total Bilirubin: 2.9 mg/dL — ABNORMAL HIGH (ref 0.3–1.2)
Total Protein: 5.3 g/dL — ABNORMAL LOW (ref 6.5–8.1)

## 2023-06-26 LAB — COOXEMETRY PANEL
Carboxyhemoglobin: 3.1 % — ABNORMAL HIGH (ref 0.5–1.5)
Methemoglobin: <0.7 % (ref 0.0–1.5)
O2 Saturation: 78.1 %
Total hemoglobin: 7.7 g/dL — ABNORMAL LOW (ref 12.0–16.0)

## 2023-06-26 LAB — CBC
HCT: 23.8 % — ABNORMAL LOW (ref 39.0–52.0)
Hemoglobin: 7.2 g/dL — ABNORMAL LOW (ref 13.0–17.0)
MCH: 32.7 pg (ref 26.0–34.0)
MCHC: 30.3 g/dL (ref 30.0–36.0)
MCV: 108.2 fL — ABNORMAL HIGH (ref 80.0–100.0)
Platelets: 187 10*3/uL (ref 150–400)
RBC: 2.2 MIL/uL — ABNORMAL LOW (ref 4.22–5.81)
RDW: 25.7 % — ABNORMAL HIGH (ref 11.5–15.5)
WBC: 14.1 10*3/uL — ABNORMAL HIGH (ref 4.0–10.5)
nRBC: 0.2 % (ref 0.0–0.2)

## 2023-06-26 LAB — GLUCOSE, CAPILLARY: Glucose-Capillary: 147 mg/dL — ABNORMAL HIGH (ref 70–99)

## 2023-06-26 LAB — PROCALCITONIN: Procalcitonin: 0.22 ng/mL

## 2023-06-26 LAB — MAGNESIUM: Magnesium: 2.2 mg/dL (ref 1.7–2.4)

## 2023-06-26 LAB — LACTIC ACID, PLASMA: Lactic Acid, Venous: 0.7 mmol/L (ref 0.5–1.9)

## 2023-06-26 MED ORDER — MELATONIN 3 MG PO TABS
6.0000 mg | ORAL_TABLET | Freq: Every day | ORAL | Status: DC
Start: 2023-06-26 — End: 2023-06-30
  Administered 2023-06-26 – 2023-06-28 (×3): 6 mg via ORAL
  Filled 2023-06-26 (×3): qty 2

## 2023-06-26 MED ORDER — FUROSEMIDE 10 MG/ML IJ SOLN
15.0000 mg/h | INTRAVENOUS | Status: DC
  Administered 2023-06-26 – 2023-06-27 (×2): 5 mg/h via INTRAVENOUS
  Administered 2023-06-29: 8 mg/h via INTRAVENOUS
  Administered 2023-06-29: 15 mg/h via INTRAVENOUS
  Filled 2023-06-26 (×7): qty 20

## 2023-06-26 MED ORDER — MIDODRINE HCL 5 MG PO TABS
5.0000 mg | ORAL_TABLET | Freq: Two times a day (BID) | ORAL | Status: DC
  Administered 2023-06-26 – 2023-06-27 (×2): 5 mg via ORAL
  Filled 2023-06-26 (×2): qty 1

## 2023-06-26 MED ORDER — NOREPINEPHRINE 4 MG/250ML-% IV SOLN
0.0000 ug/min | INTRAVENOUS | Status: DC
  Administered 2023-06-26: 8 ug/min via INTRAVENOUS
  Administered 2023-06-26: 10 ug/min via INTRAVENOUS
  Administered 2023-06-26 – 2023-06-27 (×2): 6 ug/min via INTRAVENOUS
  Administered 2023-06-27: 4 ug/min via INTRAVENOUS
  Administered 2023-06-28: 8 ug/min via INTRAVENOUS
  Administered 2023-06-28: 9 ug/min via INTRAVENOUS
  Administered 2023-06-29: 8 ug/min via INTRAVENOUS
  Administered 2023-06-29: 22 ug/min via INTRAVENOUS
  Administered 2023-06-29: 12 ug/min via INTRAVENOUS
  Administered 2023-06-29: 19 ug/min via INTRAVENOUS
  Administered 2023-06-29: 11 ug/min via INTRAVENOUS
  Filled 2023-06-26 (×12): qty 250

## 2023-06-26 MED ORDER — MELATONIN 3 MG PO TABS
6.0000 mg | ORAL_TABLET | Freq: Once | ORAL | Status: AC
  Administered 2023-06-26: 6 mg via ORAL
  Filled 2023-06-26: qty 2

## 2023-06-26 NOTE — Plan of Care (Signed)
  Problem: Acute Rehab PT Goals(only PT should resolve) Goal: Pt Will Go Supine/Side To Sit Outcome: Progressing Flowsheets (Taken 06/26/2023 1549) Pt will go Supine/Side to Sit:  with minimal assist  with moderate assist Goal: Patient Will Transfer Sit To/From Stand Outcome: Progressing Flowsheets (Taken 06/26/2023 1549) Patient will transfer sit to/from stand:  with minimal assist  with moderate assist Goal: Pt Will Transfer Bed To Chair/Chair To Bed Outcome: Progressing Flowsheets (Taken 06/26/2023 1549) Pt will Transfer Bed to Chair/Chair to Bed:  with min assist  with mod assist Goal: Pt Will Ambulate Outcome: Progressing Flowsheets (Taken 06/26/2023 1549) Pt will Ambulate:  25 feet  with minimal assist  with moderate assist  with rolling walker   3:50 PM, 06/26/23 Ocie Bob, MPT Physical Therapist with [facility] [phone] office [phone] mobile phone

## 2023-06-26 NOTE — [place] FL2 (Signed)
[place] MEDICAID FL2 LEVEL OF CARE FORM     IDENTIFICATION  Patient Name: Leonard Cisneros Birthdate: 26-Apr-1941 Sex: male Admission Date (Current Location): 07/04/2023  [facility] and [place] Number:  [employer] and Address:  [facility],  [address], [place] [address]      Provider Number: [id number]  Attending Physician Name and Address:  Vassie Loll, MD  Relative Name and Phone Number:       Current Level of Care: Hospital Recommended Level of Care: Skilled Nursing Facility Prior Approval Number:    Date Approved/Denied:   PASRR Number: [id number]  Discharge Plan: SNF    Current Diagnoses: Patient Active Problem List   Diagnosis Date Noted   CHF (congestive heart failure) (HCC) 06/08/2023   Hydrocele of testis 06/13/2023   Coronary artery disease 04/10/2023   MDS (myelodysplastic syndrome) (HCC) 04/09/2023   Demand ischemia (HCC) 04/03/2023   Atrial fibrillation (HCC) 04/03/2023   AKI (acute kidney injury) (HCC) 04/02/2023   Acute on chronic combined systolic and diastolic CHF (congestive heart failure) (HCC) 04/02/2023   Microcytic anemia 04/02/2023   History of duodenal ulcer 04/01/2023   Anemia 04/01/2023   GIB (gastrointestinal bleeding) 03/31/2023   Acute exacerbation of CHF (congestive heart failure) (HCC) 02/14/2023   Elevated alkaline phosphatase level 02/14/2023   Symptomatic anemia 02/14/2023   Acute on chronic heart failure with preserved ejection fraction (HFpEF) (HCC) 02/14/2023   Pressure injury of skin 02/14/2023   Decubitus ulcer of buttock, right, unstageable (HCC) 02/12/2023   Fatigue 02/12/2023   Stasis dermatitis of both legs 01/11/2023   Permanent atrial fibrillation (HCC) 12/27/2022   Contraindication to anticoagulation therapy 12/27/2022   Hemorrhagic shock (HCC) 12/26/2022   ABLA (acute blood loss anemia) 12/26/2022   Gastrointestinal hemorrhage 12/26/2022   Lactic acidosis 12/26/2022   Coronary  artery disease involving native coronary artery of native heart without angina pectoris 12/26/2022   Hypovolemic shock (HCC) 12/26/2022   Duodenal ulcer 11/21/2022   Gastritis and gastroduodenitis 11/21/2022   Heme positive stool 11/20/2022   Melena 11/20/2022   Anemia due to chronic blood loss 11/20/2022   History of CAD (coronary artery disease) 11/19/2022   Elevated troponin 11/19/2022   SOB (shortness of breath) 11/18/2022   Acute respiratory failure with hypoxia (HCC) 09/27/2022   Nonrheumatic aortic valve stenosis 09/27/2022   Unstable angina (HCC) 09/27/2022   Acute heart failure (HCC) 09/24/2022   Hypokalemia 09/24/2022   Hyponatremia 09/24/2022   Abnormal liver enzymes 09/24/2022   Hyperbilirubinemia 09/24/2022   Volume overload 09/24/2022   Acquired thrombophilia (HCC) 09/24/2022   Severe anemia 09/24/2022   Cellulitis 11/12/2021   ETOH abuse 11/12/2021   Ambulatory dysfunction 11/12/2021   Severe pulmonary hypertension (HCC) 11/12/2021   Moderate aortic stenosis 11/12/2021   Systolic congestive heart failure (HCC) 11/12/2021   Lumbar post-laminectomy syndrome 06/15/2021   Chronic pain syndrome 06/15/2021   Lumbar pain 08/31/2020   Pain of left hip joint 08/03/2020   History of revision of total replacement of right hip joint 06/10/2019   Persistent atrial fibrillation (HCC) 04/09/2019   Atrial fibrillation with RVR (HCC) 04/08/2019   Acute on chronic diastolic congestive heart failure (HCC)    Macrocytic anemia    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   Chest pain 10/22/2018   Physical deconditioning 04/26/2018   Unsteady gait 04/26/2018   Closed fracture of femur, greater trochanter (HCC) 04/25/2018   Pain in lower limb 04/24/2018   Fall 04/21/2018   Peripheral  vascular disease (HCC) 04/21/2018   History of adenomatous polyp of colon 08/20/2017   Ischemic myocardial dysfunction 02/02/2016   Recurrent coronary arteriosclerosis after percutaneous  transluminal coronary angioplasty 02/01/2016   Exercise-induced angina (HCC) 01/28/2016   Hyperlipidemia 01/28/2016   Neuropathic pain 06/13/2015   Mild obesity 04/20/2015   Essential hypertension 04/20/2015   Disorder of peripheral nervous system 04/04/2014   Foreign body (FB) in soft tissue 03/31/2014   Disorder of glucose metabolism (HCC) 03/31/2014   Obesity with body mass index 30 or greater 03/31/2014   History of coronary artery bypass graft 01/04/2014   Tear of left rotator cuff 10/22/2013   Rotator cuff syndrome of left shoulder 10/22/2013   Long term (current) use of anticoagulants 06/18/2013   Encounter for general adult medical examination without abnormal findings 06/18/2012   High risk medication use 06/18/2012    Orientation RESPIRATION BLADDER Height & Weight     Self, Time, Situation, Place  O2 (2L) Continent Weight: 207 lb 7.3 oz (94.1 kg) Height:  5\' 11"  (180.3 cm)  BEHAVIORAL SYMPTOMS/MOOD NEUROLOGICAL BOWEL NUTRITION STATUS      Continent Diet (See d/c summary)  AMBULATORY STATUS COMMUNICATION OF NEEDS Skin   Extensive Assist Verbally Other (Comment) (Redness to bilateral legs. Stage II to sacrum with foam dressing. Non-pressure wound pretibial bilateral)                       Personal Care Assistance Level of Assistance  Bathing, Feeding, Dressing Bathing Assistance: Maximum assistance Feeding assistance: Limited assistance Dressing Assistance: Maximum assistance     Functional Limitations Info  Sight, Hearing, Speech Sight Info: Adequate Hearing Info: Adequate Speech Info: Adequate    SPECIAL CARE FACTORS FREQUENCY  PT (By licensed PT)     PT Frequency: 5x weekly              Contractures      Additional Factors Info  Code Status, Allergies Code Status Info: DNR- Limited Allergies Info: Augmentin (Amoxicillin-pot Clavulanate)           Current Medications (06/26/2023):  This is the current hospital active medication  list Current Facility-Administered Medications  Medication Dose Route Frequency Provider Last Rate Last Admin   0.9 %  sodium chloride infusion (Manually program via Guardrails IV Fluids)   Intravenous Once Kathlen Mody, MD       acetaminophen (TYLENOL) tablet 500 mg  500 mg Oral Q6H PRN Kathlen Mody, MD       Or   acetaminophen (TYLENOL) suppository 650 mg  650 mg Rectal Q6H PRN Kathlen Mody, MD       atorvastatin (LIPITOR) tablet 80 mg  80 mg Oral Daily Kathlen Mody, MD   80 mg at 06/26/23 0830   Chlorhexidine Gluconate Cloth 2 % PADS 6 each  6 each Topical Daily Kathlen Mody, MD   6 each at 06/26/23 1131   furosemide (LASIX) 200 mg in dextrose 5 % 100 mL (2 mg/mL) infusion  5 mg/hr Intravenous Continuous [name], MD 2.5 mL/hr at 06/26/23 1511 5 mg/hr at 06/26/23 1511   midodrine (PROAMATINE) tablet 5 mg  5 mg Oral BID WC [name], MD       norepinephrine (LEVOPHED) 4mg  in (0.016 mg/mL) premix infusion  0-40 mcg/min Intravenous Titrated Adefeso, Oladapo, DO 30 mL/hr at 06/26/23 1511 8 mcg/min at 06/26/23 1511   ondansetron (ZOFRAN) tablet 4 mg  4 mg Oral Q6H PRN Kathlen Mody, MD  Or   ondansetron (ZOFRAN) injection 4 mg  4 mg Intravenous Q6H PRN Kathlen Mody, MD       pantoprazole (PROTONIX) EC tablet 40 mg  40 mg Oral BID Kathlen Mody, MD   40 mg at 06/26/23 0830   sodium chloride flush (NS) 0.9 % injection 10-40 mL  10-40 mL Intracatheter Q12H Kathlen Mody, MD   10 mL at 06/26/23 1914   sodium chloride flush (NS) 0.9 % injection 10-40 mL  10-40 mL Intracatheter PRN Kathlen Mody, MD       sorbitol 70 % solution 30 mL  30 mL Oral Daily PRN Kathlen Mody, MD         Discharge Medications: Please see discharge summary for a list of discharge medications.  Relevant Imaging Results:  Relevant Lab Results:   Additional Information SSN: [id number]  Karn Cassis, LCSW

## 2023-06-26 NOTE — Consult Note (Signed)
Consultation Note Date: 06/26/2023   Patient Name: Leonard Cisneros  DOB: Nov 21, 1940  MRN: [id number]  Age / Sex: 82 y.o., male  PCP: [name], FNP Referring Physician: Vassie Loll, MD  Reason for Consultation: Establishing goals of care  HPI/Patient Profile: 82 y.o. male  with past medical history of HFrEF EF 30%, CAD, s/p CABG, atrial fibrillation s/p ablation, mild AS, mild mitral stenosis, HTN, HLD, peripheral neuropathy, chronic low back, myelodysplastic syndrome, anemia, duodenal ulcer, GIB pain admitted on 06/25/2023 with recurrent heart failure exacerbation and anemia. Hospitalization complicated by AKI and hypotension requiring vasopressor support.   Clinical Assessment and Goals of Care: Consult received and chart review completed. Discussed with Dr. Gwenlyn Perking as well as RN. I met today with Mr. Loughner and have unfortunately just missed his wife at bedside. Mr. Lindau is sitting up in recliner. He denies any pain or discomfort. He has oxygen and denies shortness of breath but does appear to have poor reserve noted during conversation.   Mr. Brenan and I discussed his underlying heart failure and how this is complicated by his anemia and low blood pressure. Mr. Mitrovic understands that these issues are not going away and knows they will only become more difficult to manage and control with time. He tells me that he is realistic and "we all have to go sometime." He tells me he has no worries or fears. He reassures me that he has had a good and a full life. He is prepared whenever his time comes.   We discuss goals of care and his wishes. We discussed code status and he does easily tell me that he would not want chest compressions or to be on life support - he agrees with DNR. He tells me that his wife knows him and would agree. They have been married 50+ years. They have 2 children and 6 grandchildren  - he is very proud of his family. He wishes to maintain his disease processes with medications the best we are able. Hopeful for more time but aware of the limitations of medicine. He is not interested in being connected to machines or aggressive interventions to prolong life outside of medications. I reassure him that we have many ways to support and care for him even when the medications don't work as well as we hope. Mr. Weigman gives permission for me to call and discuss with his wife as well.   I called and discussed with Mrs. Roudebush. Mrs. Pellitteri speaks very similarly to Mr. Plaza and is very realistic about overall poor prognosis and limitations of medicine. She agrees with DNR status and is not surprised this is his wish. She agrees to time for outcomes with hopes of improvement and taking one day at a time.   All questions/concerns addressed. Emotional support provided.   Primary Decision Maker PATIENT    SUMMARY OF RECOMMENDATIONS   - DNR decided - Will see how he progresses over the coming days - Ongoing palliative support and conversations  Code Status/Advance Care Planning:  DNR   Symptom Management:  Per attending, cardiology  Prognosis:  Overall poor prognosis but endorses good QOL at current time.   Discharge Planning: To Be Determined      Primary Diagnoses: Present on Admission:  Symptomatic anemia  Essential hypertension  Atrial fibrillation with RVR (HCC)  Anemia  AKI (acute kidney injury) (HCC)  Macrocytic anemia  Physical deconditioning  Duodenal ulcer  Stasis dermatitis of both legs  MDS (myelodysplastic syndrome) (HCC)   I have reviewed the medical record, interviewed the patient and family, and examined the patient. The following aspects are pertinent.  Past Medical History:  Diagnosis Date   Aortic stenosis    Atrial flutter (HCC)    a. s/p remote ablation by Dr. Ladona Ridgel.   CAD (coronary artery disease)    a. s/p CABG 2003. b. 01/2016: unstable  angina - s/p BMS to SVG-ramus intermediate, patent LIMA-mLAD, patent RIMA-dRCA.    CHF (congestive heart failure) (HCC)    Chronic lower back pain    Essential hypertension    Hyperlipidemia    Ischemic cardiomyopathy    a. Cath 01/2016 -  EF 50-55% with mild distal inferior hypocontractility.   Peripheral neuropathy 04/04/2014   Permanent atrial fibrillation (HCC)    Social History   Socioeconomic History   Marital status: Married    Spouse name: Larita Fife   Number of children: 2   Years of education: Not on file   Highest education level: Bachelor's degree (e.g., BA, AB, BS)  Occupational History   Occupation: PT Holiday representative: RETIRED  Tobacco Use   Smoking status: Former    Current packs/day: 0.00    Average packs/day: 0.5 packs/day for 2.0 years (1.0 ttl pk-yrs)    Types: Cigarettes    Start date: 09/03/1964    Quit date: 09/03/1966    Years since quitting: 56.8   Smokeless tobacco: Never  Vaping Use   Vaping status: Never Used  Substance and Sexual Activity   Alcohol use: Yes    Alcohol/week: 11.0 standard drinks of alcohol    Types: 7 Glasses of wine, 4 Cans of beer per week    Comment: a glass of wine with dinner/ beer on the weekend   Drug use: No   Sexual activity: Not on file  Other Topics Concern   Not on file  Social History Narrative   Lives with his wife.   Social Determinants of Health   Financial Resource Strain: Low Risk  (04/03/2023)   Overall Financial Resource Strain (CARDIA)    Difficulty of Paying Living Expenses: Not very hard  Food Insecurity: No Food Insecurity (06/25/2023)   Hunger Vital Sign    Worried About Running Out of Food in the Last Year: Never true    Ran Out of Food in the Last Year: Never true  Transportation Needs: No Transportation Needs (06/25/2023)   PRAPARE - Administrator, Civil Service (Medical): No    Lack of Transportation (Non-Medical): No  Physical Activity: Unknown (11/25/2022)   Exercise  Vital Sign    Days of Exercise per Week: 0 days    Minutes of Exercise per Session: Not on file  Stress: No Stress Concern Present (01/04/2023)   [employer] of Occupational Health - Occupational Stress Questionnaire    Feeling of Stress : Not at all  Social Connections: Moderately Integrated (11/25/2022)   Social Connection and Isolation Panel [NHANES]    Frequency of Communication with Friends and Family: Once  a week    Frequency of Social Gatherings with Friends and Family: Once a week    Attends Religious Services: More than 4 times per year    Active Member of [employer] or Organizations: Yes    Attends Engineer, structural: More than 4 times per year    Marital Status: Married   Family History  Problem Relation Age of Onset   Heart attack Brother    Colon cancer Neg Hx    Scheduled Meds:  sodium chloride   Intravenous Once   atorvastatin  80 mg Oral Daily   Chlorhexidine Gluconate Cloth  6 each Topical Daily   midodrine  5 mg Oral BID WC   pantoprazole  40 mg Oral BID   sodium chloride flush  10-40 mL Intracatheter Q12H   Continuous Infusions:  sodium chloride     furosemide (LASIX) 200 mg in dextrose 5 % 100 mL (2 mg/mL) infusion     norepinephrine (LEVOPHED) Adult infusion 10 mcg/min (06/26/23 0335)   PRN Meds:.acetaminophen **OR** acetaminophen, ondansetron **OR** ondansetron (ZOFRAN) IV, sodium chloride flush, sorbitol Allergies  Allergen Reactions   Augmentin [Amoxicillin-Pot Clavulanate] Nausea And Vomiting   Review of Systems  Constitutional:  Positive for activity change, appetite change and fatigue.  Respiratory:  Negative for shortness of breath.   Cardiovascular:  Positive for leg swelling.  Skin:        BLE red and have been weeping  Neurological:  Positive for weakness.  Psychiatric/Behavioral:  Negative for sleep disturbance. The patient is not nervous/anxious.     Physical Exam Vitals and nursing note reviewed.  Constitutional:       General: He is not in acute distress.    Appearance: He is ill-appearing.  Cardiovascular:     Rate and Rhythm: Normal rate.  Pulmonary:     Effort: No tachypnea, accessory muscle usage or respiratory distress.     Comments: Self limiting to catch breathe during conversation Skin:    Comments: BLE edema and erythema  Neurological:     Mental Status: He is alert and oriented to person, place, and time.     Vital Signs: BP 117/66   Pulse 89   Temp 98.3 F (36.8 C) (Oral)   Resp (!) 21   Ht 5\' 11"  (1.803 m)   Wt 94.1 kg   SpO2 97%   BMI 28.93 kg/m  Pain Scale: 0-10   Pain Score: 0-No pain   SpO2: SpO2: 97 % O2 Device:SpO2: 97 % O2 Flow Rate: .O2 Flow Rate (L/min): 2 L/min  IO: Intake/output summary:  Intake/Output Summary (Last 24 hours) at 06/26/2023 1006 Last data filed at 06/26/2023 4401 Gross per 24 hour  Intake 1771.88 ml  Output 950 ml  Net 821.88 ml    LBM: Last BM Date : 06/25/23 Baseline Weight: Weight: 94.8 kg Most recent weight: Weight: 94.1 kg     Palliative Assessment/Data:    Time Total: 80  min  Greater than 50%  of this time was spent counseling and coordinating care related to the above assessment and plan.  Signed by: Yong Channel, NP Palliative Medicine Team Pager # [phone] (M-F 8a-5p) Team Phone # [phone] (Nights/Weekends)

## 2023-06-26 NOTE — Progress Notes (Signed)
Date and time results received: 06/25/23 @ 2155  Test: Total hemoglobin  Critical Value: <6.9 Test: Met hemoglobin = <0.7  Name of Provider Notified: Dr. Thomes Dinning   Orders Received? Or Actions Taken?: no new orders

## 2023-06-26 NOTE — Progress Notes (Signed)
[facility]                                                                               [name], is a 82 y.o. male, DOB - 1940-09-10, XLK:[id number] Admit date - 06/24/2023    Outpatient Primary MD for the patient is Leonard Laroche, FNP  LOS - 2  days    Brief summary   [name] is a 82 y.o. male with medical history significant of EF=30% months ago, CAD, h/o CABG, afib, mild AS and mild Mitral stenosis.  His last cath was 09/2022., h/o myelodysplastic syndrome after a bone marrow biopsy in July 2024  and on Aranesp 500 mcg every 2 weeks, but also has history of duodenal ulcer,  internal hemorrhoids, presents with generalized weakness, sob.    CXR showing Moderate congestive heart failure/pulmonary edema. Small to moderate bilateral pleural effusions with associated compressive atelectatic changes, worsened since the prior study.      Assessment & Plan    Assessment and Plan: Acute Severe systolic CHF on chronic HFrEF with anasarca and lower extremity edema.  Lst echo shows LVEF of 30 to 35%.  -Continue IV diuresis -Appreciate assistance and recommendation by cardiology service -GDMT currently limited secondary to low blood pressure and renal function. -[name] has been started on Levophed and midodrine -Follow clinical response -Lactic acid within normal limits -COx 78  CAD S/P CABG in 2003.  Cath in 09/2022 showed patent LIMA-LAD and RIMA-PDA with occluded SVG-RI which appeared to be chronic and med management was recommended.  Further ischemic testing not done due to severe anemia.  -Continue statin -Continue to follow cardiology service recommendations.  Permanent Atrial fibrillation  --Not on anti coagulation due to h/o GI bleed and MDS and symptomatic anemia.  Planning to restart pt on toprol 25 mg once BP improves.   Hyponatremia: Secondary to fluid overload.  -Continue to follow electrolytes with diuresis.    MILD  AKI -Baseline creatinine around 1, current at 1.5 probably from CHF. / cardio renal syndrome attention -Continue the use of pressor supports, midodrine Lasix -Follow renal function trend.  Leukocytosis:  -Unclear etiology.  -Most likely stress demargination.  MDS explaining his anemia:  Hemoglobin around 6.5 on 06/25/2023 -1 unit PRBCs transfused -Hemoglobin up to 7.2  H/o PUD; On protonix, continue the same.  -No overt bleeding appreciated.   Code Status: DNR. DVT Prophylaxis:  SCD'S   Level of Care: Level of care: ICU  Family Communication: Wife at bedside.   Disposition Plan:     Remains inpatient appropriate:  IV lasix, pressor support.   Procedures:  See below for x-ray reports.  Consultants:   Cardiology.  Palliative care.   Antimicrobials:   Anti-infectives (From admission, onward)    None        Medications  Scheduled Meds:  sodium chloride   Intravenous Once   atorvastatin  80 mg Oral Daily   Chlorhexidine Gluconate Cloth  6 each Topical Daily   midodrine  5 mg Oral BID WC   pantoprazole  40 mg Oral BID   sodium chloride flush  10-40 mL Intracatheter Q12H   Continuous Infusions:  furosemide (LASIX)  region. No pneumothorax. 2. Cardiac enlargement with basilar infiltration and pleural effusions, similar to prior study. Electronically Signed   By: Burman Nieves M.D.   On: 06/25/2023 21:12   ECHOCARDIOGRAM LIMITED  Result Date: 06/25/2023    ECHOCARDIOGRAM LIMITED REPORT   Patient Name:   Leonard Cisneros Date of Exam: 06/25/2023 Medical Rec #:  [id number]        Height:       71.0 in Accession #:    [id number]       Weight:       207.5 lb Date of Birth:  1941/05/17       BSA:          2.142 m Patient Age:    81 years         BP:           97/66 mmHg Patient Gender: M                HR:           88 bpm. [facility] Procedure: Limited Echo and Limited Color Doppler Indications:    CHF I50.90  History:        Patient has prior history of Echocardiogram examinations, most                 recent 04/02/2023. CHF and Cardiomyopathy, CAD and Angina,                 Arrythmias:Atrial Fibrillation, Signs/Symptoms:Chest Pain and                 Shortness of Breath; Risk Factors:Hypertension and Dyslipidemia.  Sonographer:    Lucendia Herrlich RCS Referring Phys: [id number] Dorothe Pea BRANCH IMPRESSIONS  1. Left ventricular ejection fraction, by estimation, is 30 to 35%. The left ventricle has moderately decreased function. Left ventricular diastolic parameters are indeterminate.  2. RV not well visualized. Grossly appears mildly enlarged with mildly decreased function. . Right ventricular systolic function was not well visualized. The  right ventricular size is not well visualized. There is mildly elevated pulmonary artery systolic pressure.  3. Moderate mitral valve regurgitation. Mild to moderate mitral stenosis. The mean mitral valve gradient is 6.0 mmHg. Moderate mitral annular calcification.  4. The aortic valve has an indeterminant number of cusps. There is severe calcifcation of the aortic valve. There is severe thickening of the aortic valve. Aortic valve regurgitation is not visualized. Moderate aortic valve stenosis. Aortic valve mean gradient measures 23.5 mmHg. Aortic valve peak gradient measures 42.2 mmHg. Aortic valve area, by VTI measures 1.06 cm.   5. The inferior vena cava is dilated in size with <50% respiratory variability, suggesting right atrial pressure of 15 mmHg. FINDINGS  Left Ventricle: Left ventricular ejection fraction, by estimation, is 30 to 35%. The left ventricle has moderately decreased function. Left ventricular diastolic parameters are indeterminate. Right Ventricle: RV not well visualized. Grossly appears mildly enlarged with mildly decreased function. The right ventricular size is not well visualized. Right vetricular wall thickness was not well visualized. Right ventricular systolic function was not well visualized. There is mildly elevated pulmonary artery systolic pressure. The tricuspid regurgitant velocity is 2.56 m/s, and with an assumed right atrial pressure of 15 mmHg, the estimated right ventricular systolic pressure is 41.2 mmHg. Mitral Valve: There is moderate thickening of the mitral valve leaflet(s). There is moderate calcification of the mitral valve leaflet(s). Moderate mitral annular calcification. Moderate mitral valve regurgitation. Mild to moderate mitral valve  region. No pneumothorax. 2. Cardiac enlargement with basilar infiltration and pleural effusions, similar to prior study. Electronically Signed   By: Burman Nieves M.D.   On: 06/25/2023 21:12   ECHOCARDIOGRAM LIMITED  Result Date: 06/25/2023    ECHOCARDIOGRAM LIMITED REPORT   Patient Name:   Leonard Cisneros Date of Exam: 06/25/2023 Medical Rec #:  [id number]        Height:       71.0 in Accession #:    [id number]       Weight:       207.5 lb Date of Birth:  1941/05/17       BSA:          2.142 m Patient Age:    81 years         BP:           97/66 mmHg Patient Gender: M                HR:           88 bpm. [facility] Procedure: Limited Echo and Limited Color Doppler Indications:    CHF I50.90  History:        Patient has prior history of Echocardiogram examinations, most                 recent 04/02/2023. CHF and Cardiomyopathy, CAD and Angina,                 Arrythmias:Atrial Fibrillation, Signs/Symptoms:Chest Pain and                 Shortness of Breath; Risk Factors:Hypertension and Dyslipidemia.  Sonographer:    Lucendia Herrlich RCS Referring Phys: [id number] Dorothe Pea BRANCH IMPRESSIONS  1. Left ventricular ejection fraction, by estimation, is 30 to 35%. The left ventricle has moderately decreased function. Left ventricular diastolic parameters are indeterminate.  2. RV not well visualized. Grossly appears mildly enlarged with mildly decreased function. . Right ventricular systolic function was not well visualized. The  right ventricular size is not well visualized. There is mildly elevated pulmonary artery systolic pressure.  3. Moderate mitral valve regurgitation. Mild to moderate mitral stenosis. The mean mitral valve gradient is 6.0 mmHg. Moderate mitral annular calcification.  4. The aortic valve has an indeterminant number of cusps. There is severe calcifcation of the aortic valve. There is severe thickening of the aortic valve. Aortic valve regurgitation is not visualized. Moderate aortic valve stenosis. Aortic valve mean gradient measures 23.5 mmHg. Aortic valve peak gradient measures 42.2 mmHg. Aortic valve area, by VTI measures 1.06 cm.   5. The inferior vena cava is dilated in size with <50% respiratory variability, suggesting right atrial pressure of 15 mmHg. FINDINGS  Left Ventricle: Left ventricular ejection fraction, by estimation, is 30 to 35%. The left ventricle has moderately decreased function. Left ventricular diastolic parameters are indeterminate. Right Ventricle: RV not well visualized. Grossly appears mildly enlarged with mildly decreased function. The right ventricular size is not well visualized. Right vetricular wall thickness was not well visualized. Right ventricular systolic function was not well visualized. There is mildly elevated pulmonary artery systolic pressure. The tricuspid regurgitant velocity is 2.56 m/s, and with an assumed right atrial pressure of 15 mmHg, the estimated right ventricular systolic pressure is 41.2 mmHg. Mitral Valve: There is moderate thickening of the mitral valve leaflet(s). There is moderate calcification of the mitral valve leaflet(s). Moderate mitral annular calcification. Moderate mitral valve regurgitation. Mild to moderate mitral valve  region. No pneumothorax. 2. Cardiac enlargement with basilar infiltration and pleural effusions, similar to prior study. Electronically Signed   By: Burman Nieves M.D.   On: 06/25/2023 21:12   ECHOCARDIOGRAM LIMITED  Result Date: 06/25/2023    ECHOCARDIOGRAM LIMITED REPORT   Patient Name:   Leonard Cisneros Date of Exam: 06/25/2023 Medical Rec #:  [id number]        Height:       71.0 in Accession #:    [id number]       Weight:       207.5 lb Date of Birth:  1941/05/17       BSA:          2.142 m Patient Age:    81 years         BP:           97/66 mmHg Patient Gender: M                HR:           88 bpm. [facility] Procedure: Limited Echo and Limited Color Doppler Indications:    CHF I50.90  History:        Patient has prior history of Echocardiogram examinations, most                 recent 04/02/2023. CHF and Cardiomyopathy, CAD and Angina,                 Arrythmias:Atrial Fibrillation, Signs/Symptoms:Chest Pain and                 Shortness of Breath; Risk Factors:Hypertension and Dyslipidemia.  Sonographer:    Lucendia Herrlich RCS Referring Phys: [id number] Dorothe Pea BRANCH IMPRESSIONS  1. Left ventricular ejection fraction, by estimation, is 30 to 35%. The left ventricle has moderately decreased function. Left ventricular diastolic parameters are indeterminate.  2. RV not well visualized. Grossly appears mildly enlarged with mildly decreased function. . Right ventricular systolic function was not well visualized. The  right ventricular size is not well visualized. There is mildly elevated pulmonary artery systolic pressure.  3. Moderate mitral valve regurgitation. Mild to moderate mitral stenosis. The mean mitral valve gradient is 6.0 mmHg. Moderate mitral annular calcification.  4. The aortic valve has an indeterminant number of cusps. There is severe calcifcation of the aortic valve. There is severe thickening of the aortic valve. Aortic valve regurgitation is not visualized. Moderate aortic valve stenosis. Aortic valve mean gradient measures 23.5 mmHg. Aortic valve peak gradient measures 42.2 mmHg. Aortic valve area, by VTI measures 1.06 cm.   5. The inferior vena cava is dilated in size with <50% respiratory variability, suggesting right atrial pressure of 15 mmHg. FINDINGS  Left Ventricle: Left ventricular ejection fraction, by estimation, is 30 to 35%. The left ventricle has moderately decreased function. Left ventricular diastolic parameters are indeterminate. Right Ventricle: RV not well visualized. Grossly appears mildly enlarged with mildly decreased function. The right ventricular size is not well visualized. Right vetricular wall thickness was not well visualized. Right ventricular systolic function was not well visualized. There is mildly elevated pulmonary artery systolic pressure. The tricuspid regurgitant velocity is 2.56 m/s, and with an assumed right atrial pressure of 15 mmHg, the estimated right ventricular systolic pressure is 41.2 mmHg. Mitral Valve: There is moderate thickening of the mitral valve leaflet(s). There is moderate calcification of the mitral valve leaflet(s). Moderate mitral annular calcification. Moderate mitral valve regurgitation. Mild to moderate mitral valve  [facility]                                                                               [name], is a 82 y.o. male, DOB - 1940-09-10, XLK:[id number] Admit date - 06/24/2023    Outpatient Primary MD for the patient is Leonard Laroche, FNP  LOS - 2  days    Brief summary   [name] is a 82 y.o. male with medical history significant of EF=30% months ago, CAD, h/o CABG, afib, mild AS and mild Mitral stenosis.  His last cath was 09/2022., h/o myelodysplastic syndrome after a bone marrow biopsy in July 2024  and on Aranesp 500 mcg every 2 weeks, but also has history of duodenal ulcer,  internal hemorrhoids, presents with generalized weakness, sob.    CXR showing Moderate congestive heart failure/pulmonary edema. Small to moderate bilateral pleural effusions with associated compressive atelectatic changes, worsened since the prior study.      Assessment & Plan    Assessment and Plan: Acute Severe systolic CHF on chronic HFrEF with anasarca and lower extremity edema.  Lst echo shows LVEF of 30 to 35%.  -Continue IV diuresis -Appreciate assistance and recommendation by cardiology service -GDMT currently limited secondary to low blood pressure and renal function. -[name] has been started on Levophed and midodrine -Follow clinical response -Lactic acid within normal limits -COx 78  CAD S/P CABG in 2003.  Cath in 09/2022 showed patent LIMA-LAD and RIMA-PDA with occluded SVG-RI which appeared to be chronic and med management was recommended.  Further ischemic testing not done due to severe anemia.  -Continue statin -Continue to follow cardiology service recommendations.  Permanent Atrial fibrillation  --Not on anti coagulation due to h/o GI bleed and MDS and symptomatic anemia.  Planning to restart pt on toprol 25 mg once BP improves.   Hyponatremia: Secondary to fluid overload.  -Continue to follow electrolytes with diuresis.    MILD  AKI -Baseline creatinine around 1, current at 1.5 probably from CHF. / cardio renal syndrome attention -Continue the use of pressor supports, midodrine Lasix -Follow renal function trend.  Leukocytosis:  -Unclear etiology.  -Most likely stress demargination.  MDS explaining his anemia:  Hemoglobin around 6.5 on 06/25/2023 -1 unit PRBCs transfused -Hemoglobin up to 7.2  H/o PUD; On protonix, continue the same.  -No overt bleeding appreciated.   Code Status: DNR. DVT Prophylaxis:  SCD'S   Level of Care: Level of care: ICU  Family Communication: Wife at bedside.   Disposition Plan:     Remains inpatient appropriate:  IV lasix, pressor support.   Procedures:  See below for x-ray reports.  Consultants:   Cardiology.  Palliative care.   Antimicrobials:   Anti-infectives (From admission, onward)    None        Medications  Scheduled Meds:  sodium chloride   Intravenous Once   atorvastatin  80 mg Oral Daily   Chlorhexidine Gluconate Cloth  6 each Topical Daily   midodrine  5 mg Oral BID WC   pantoprazole  40 mg Oral BID   sodium chloride flush  10-40 mL Intracatheter Q12H   Continuous Infusions:  furosemide (LASIX)

## 2023-06-26 NOTE — Evaluation (Signed)
Physical Therapy Evaluation Patient Details Name: Leonard Cisneros MRN: [id number] DOB: September 21, 1940 Today's Date: 06/26/2023  History of Present Illness  Leonard Cisneros is a 82 y.o. male with medical history significant of EF=30% months ago, CAD, h/o CABG, afib, mild AS and mild Mitral stenosis.  His last cath was 09/2022.  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  He was admitted for GI Bleed in April 2024.  The patient is not able to provide any details of his symptoms in the last couple days or how he presented to the emergency room today.  He is ED evaluation revealed the patient who has significantly decompensated congestive heart failure.  He also had mild hypotension which I see he had last hospitalization as well.  He will be admitted here and diuresed.  Despite his hypotension he had no difficulty with IV Lasix.  As matter-of-fact his blood pressure improved slightly after receiving Lasix.  The patient could not remember any details about his condition over the last couple of days and when I asked him if he was short of breath he says he does it does happen sometimes.  While he had difficulty remembering how he felt or was breathing earlier today, he was oriented x 3 and able to name the president and talk about politics.   Clinical Impression  Patient presents up in chair (assisted by nursing staff) and agreeable for therapy.    Patient required multiple attempts before able to complete sit to stand due to weakness, very unsteady on feet and limited to a few side steps before having to sit due to c/o fatigue, weakness and SOB.  Patient on room air with SpO2 dropping from 95% to 86% with exertion and put back on 2 LPM O2.  Patient put back to bed after therapy due to fatigue and c/o SOB.  Patient will benefit from continued skilled physical therapy in hospital and  recommended venue below to increase strength, balance, endurance for safe ADLs and gait.       If plan is discharge home, recommend the following: A lot of help with bathing/dressing/bathroom;A lot of help with walking and/or transfers;Help with stairs or ramp for entrance;Assistance with cooking/housework   Can travel by private vehicle   No    Equipment Recommendations None recommended by PT  Recommendations for Other Services       Functional Status Assessment Patient has had a recent decline in their functional status and demonstrates the ability to make significant improvements in function in a reasonable and predictable amount of time.     Precautions / Restrictions Precautions Precautions: Fall Restrictions Weight Bearing Restrictions: No      Mobility  Bed Mobility Overal bed mobility: Needs Assistance Bed Mobility: Sit to Supine       Sit to supine: Mod assist   General bed mobility comments: has diffiuclty moving legs due to weakness.    Transfers Overall transfer level: Needs assistance Equipment used: Rolling walker (2 wheels) Transfers: Sit to/from Stand, Bed to chair/wheelchair/BSC Sit to Stand: Mod assist   Step pivot transfers: Mod assist       General transfer comment: unsteady labored movement    Ambulation/Gait Ambulation/Gait assistance: Mod assist, Max assist Gait Distance (Feet): 4 Feet Assistive device: Rolling walker (2 wheels) Gait Pattern/deviations: Decreased step length - right, Decreased step length - left, Decreased stride length Gait  velocity: slow     General Gait Details: limited to a few slow labored unsteady side steps before having to sit due to c/o fatigue, weakness  Stairs            Wheelchair Mobility     Tilt Bed    Modified Rankin (Stroke Patients Only)       Balance Overall balance assessment: Needs assistance Sitting-balance support: Feet supported, No upper extremity supported Sitting  balance-Leahy Scale: Fair Sitting balance - Comments: fair/good seated EOB   Standing balance support: Reliant on assistive device for balance, During functional activity, Bilateral upper extremity supported Standing balance-Leahy Scale: Poor Standing balance comment: fair/poor using RW                             Pertinent Vitals/Pain Pain Assessment Pain Assessment: Faces Faces Pain Scale: Hurts little more Pain Location: BLE wiht pressure Pain Descriptors / Indicators: Sore, Grimacing Pain Intervention(s): Limited activity within patient's tolerance, Monitored during session, Repositioned    Home Living Family/patient expects to be discharged to:: Private residence Living Arrangements: Spouse/significant other Available Help at Discharge: Family;Available 24 hours/day Type of Home: House Home Access: Stairs to enter Entrance Stairs-Rails: Right Entrance Stairs-Number of Steps: 14   Home Layout: One level Home Equipment: Cane - single Librarian, academic (2 wheels);Crutches      Prior Function Prior Level of Function : Independent/Modified Independent;Driving             Mobility Comments: pt uses bilat axillary crutches at baseline, was driving ADLs Comments: Independent     Extremity/Trunk Assessment   Upper Extremity Assessment Upper Extremity Assessment: Generalized weakness    Lower Extremity Assessment Lower Extremity Assessment: Generalized weakness    Cervical / Trunk Assessment Cervical / Trunk Assessment: Kyphotic  Communication   Communication Communication: Hearing impairment;No apparent difficulties  Cognition Arousal: Alert Behavior During Therapy: WFL for tasks assessed/performed Overall Cognitive Status: Within Functional Limits for tasks assessed                                          General Comments      Exercises     Assessment/Plan    PT Assessment Patient needs continued PT services  PT  Problem List Decreased strength;Decreased activity tolerance;Decreased balance;Decreased mobility       PT Treatment Interventions DME instruction;Gait training;Stair training;Functional mobility training;Therapeutic activities;Therapeutic exercise;Balance training;Patient/family education    PT Goals (Current goals can be found in the Care Plan section)  Acute Rehab PT Goals Patient Stated Goal: return home after rehab PT Goal Formulation: With patient Time For Goal Achievement: 07/10/23 Potential to Achieve Goals: Good    Frequency Min 3X/week     Co-evaluation               AM-PAC PT "6 Clicks" Mobility  Outcome Measure Help needed turning from your back to your side while in a flat bed without using bedrails?: A Lot Help needed moving from lying on your back to sitting on the side of a flat bed without using bedrails?: A Lot Help needed moving to and from a bed to a chair (including a wheelchair)?: A Lot Help needed standing up from a chair using your arms (e.g., wheelchair or bedside chair)?: A Lot Help needed to walk in hospital room?: A Lot Help needed climbing 3-5  steps with a railing? : Total 6 Click Score: 11    End of Session Equipment Utilized During Treatment: Oxygen Activity Tolerance: Patient tolerated treatment well;Patient limited by fatigue Patient left: in bed;with call bell/phone within reach Nurse Communication: Mobility status PT Visit Diagnosis: Unsteadiness on feet (R26.81);Other abnormalities of gait and mobility (R26.89);Muscle weakness (generalized) (M62.81)    Time: 1610-9604 PT Time Calculation (min) (ACUTE ONLY): 30 min   Charges:   PT Evaluation $PT Eval Moderate Complexity: 1 Mod PT Treatments $Therapeutic Activity: 23-37 mins PT General Charges $$ ACUTE PT VISIT: 1 Visit         3:48 PM, 06/26/23 Ocie Bob, MPT Physical Therapist with [facility] [phone] office [phone] mobile phone

## 2023-06-26 NOTE — Progress Notes (Signed)
Progress Note  Patient Name: Leonard Cisneros Date of Encounter: 06/26/2023  Primary Cardiologist: [name], MD  Interval Summary   Chart reviewed including cardiology consultation from yesterday.  Patient reports no chest pain or palpitations.  PICC line now in place.  Started on low-dose Levophed with recent systolics 100-120.  Co-ox was 67 yesterday.  Lactate and procalcitonin normal.  Diuresing some on divided dose IV Lasix, but still fluid overloaded.  Vital Signs    Vitals:   06/26/23 0700 06/26/23 0730 06/26/23 0800 06/26/23 0830  BP: (!) 109/46 (!) 105/45 (!) 106/51 (!) 116/50  Pulse: 97 75 (!) 101 (!) 104  Resp: (!) 22 19 (!) 22 (!) 22  Temp:      TempSrc:      SpO2: 98% 98% 99% 98%  Weight:      Height:        Intake/Output Summary (Last 24 hours) at 06/26/2023 0856 Last data filed at 06/26/2023 0839 Gross per 24 hour  Intake 2011.88 ml  Output 950 ml  Net 1061.88 ml   Filed Weights   06/24/23 1322 06/25/23 1112  Weight: 94.8 kg 94.1 kg    Physical Exam   GEN: No acute distress.   Neck: CVP 16. Cardiac: Irregularly irregular, no gallop.  Respiratory: Nonlabored.  Decreased breath sounds at the bases bilaterally. GI: Soft, nontender, bowel sounds present. MS: Bilateral leg edema, wrapped. Neuro:  Nonfocal. Psych: Alert and oriented x 3. Normal affect.  ECG/Telemetry    Telemetry reviewed showing atrial fibrillation.  Labs    Chemistry Recent Labs  Lab 06/24/23 1435 06/25/23 0305 06/25/23 1131 06/26/23 0514  NA 132* 133*  --  134*  K 4.2 3.8  --  3.4*  CL 98 99  --  100  CO2 27 26  --  27  GLUCOSE 91 90  --  129*  BUN 34* 33*  --  36*  CREATININE 1.52* 1.50*  --  1.52*  CALCIUM 7.9* 7.7*  --  7.5*  PROT  --   --  5.5* 5.3*  ALBUMIN  --   --  2.4* 2.3*  AST  --   --  28 21  ALT  --   --  25 22  ALKPHOS  --   --  338* 316*  BILITOT  --   --  3.0* 2.9*  GFRNONAA 46* 46*  --  46*  ANIONGAP 7 8  --  7    Hematology Recent  Labs  Lab 06/24/23 1435 06/25/23 0305 06/26/23 0514  WBC 12.4* 12.4* 14.1*  RBC 2.19* 1.94* 2.20*  HGB 7.2* 6.5* 7.2*  HCT 24.2* 21.3* 23.8*  MCV 110.5* 109.8* 108.2*  MCH 32.9 33.5 32.7  MCHC 29.8* 30.5 30.3  RDW 26.2* 26.4* 25.7*  PLT 212 167 187   Cardiac Enzymes Recent Labs  Lab 06/24/23 1435 06/24/23 1703  TROPONINIHS 14 14    Cardiac Studies   Echocardiogram 06/25/2023:  1. Left ventricular ejection fraction, by estimation, is 30 to 35%. The  left ventricle has moderately decreased function. Left ventricular  diastolic parameters are indeterminate.   2. RV not well visualized. Grossly appears mildly enlarged with mildly  decreased function. . Right ventricular systolic function was not well  visualized. The right ventricular size is not well visualized. There is  mildly elevated pulmonary artery  systolic pressure.   3. Moderate mitral valve regurgitation. Mild to moderate mitral stenosis.  The mean mitral valve gradient is 6.0 mmHg. Moderate mitral  annular  calcification.   4. The aortic valve has an indeterminant number of cusps. There is severe  calcifcation of the aortic valve. There is severe thickening of the aortic  valve. Aortic valve regurgitation is not visualized. Moderate aortic valve  stenosis. Aortic valve mean  gradient measures 23.5 mmHg. Aortic valve peak gradient measures 42.2  mmHg. Aortic valve area, by VTI measures 1.06 cm.     5. The inferior vena cava is dilated in size with <50% respiratory  variability, suggesting right atrial pressure of 15 mmHg.   Assessment & Plan   1.  Acute on chronic HFrEF with fluid overload.  Weight up 10 to 15 pounds over prior baseline.  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  GDMT limited by relative hypotension.  2.  Permanent atrial fibrillation with CHA2DS2-VASc score 5. Not candidate for anticoagulation as discussed in prior cariology notes.  3.  Multivessel  CAD status post CABG in 2003 with LIMA to LAD, RIMA to RCA, and SVG to ramus intermedius. Also status post BMS to SVG to ramus intermedius in 2017, but ultimately found to have occlusion of the SVG to ramus intermedius in January of this year managed medically.  4.  Moderate, degenerative calcific aortic stenosis.  Mean AV gradient approximately 24 mmHg by follow-up echocardiogram.  5.  Moderate mitral regurgitation with mild to moderate mitral stenosis in the setting of annular calcification.  6.  Acute kidney injury, creatinine 1.5 continue.  Possibly ATN in the setting of hypotension, also with active IV diuresis.  7.  Severe anemia with MDS.  Hemoglobin 7.2.  Discussed with patient and nursing.  Follow-up co-ox this morning is 78.  Lactate and procalcitonin are normal.  Plan to start midodrine 5 mg twice daily, would wean off norepinephrine.  Switch from divided dose IV Lasix to Lasix infusion and continue diuresis as tolerated. Will adjust GDMT as able.  For questions or updates, please contact [facility] Please consult [email or web address] for contact info under   Signed, [name], MD  06/26/2023, 8:56 AM

## 2023-06-26 NOTE — TOC Progression Note (Addendum)
Transition of Care [facility]) - Progression Note    Patient Details  Name: Leonard Cisneros MRN: [id number] Date of Birth: 1941-07-18  [facility] CM/SW Contact  Leitha Bleak, RN Phone Number: 06/26/2023, 3:57 PM  Clinical Narrative:   PT is recommending SNF. CM at the bedside with patient and his wife. They are agreeable and requested [facility]. They gave permission to send out FL2.  Addendum: [facility] made a bed offer, TOC at the beside, wife and patient is accepted. Answered all their questions. TOC following to start INS AUTH.  DC planning for 3 days.     Expected Discharge Plan: Skilled Nursing Facility Barriers to Discharge: English as a second language teacher, Continued Medical Work up  Expected Discharge Plan and Services In-house Referral: Clinical Social Work Discharge Planning Services: CM Consult   Living arrangements for the past 2 months: Single Family Home                    Social Determinants of Health (SDOH) Interventions SDOH Screenings   Food Insecurity: No Food Insecurity (06/25/2023)  Housing: Low Risk  (06/25/2023)  Transportation Needs: No Transportation Needs (06/25/2023)  Utilities: Not At Risk (06/25/2023)  Alcohol Screen: Low Risk  (04/03/2023)  Depression (PHQ2-9): Low Risk  (02/12/2023)  Financial Resource Strain: Low Risk  (04/03/2023)  Physical Activity: Unknown (11/25/2022)  Social Connections: Moderately Integrated (11/25/2022)  Stress: No Stress Concern Present (01/04/2023)  Tobacco Use: Medium Risk (06/24/2023)    Readmission Risk Interventions    06/25/2023    2:09 PM 12/26/2022   10:54 AM 09/25/2022   12:42 PM  Readmission Risk Prevention Plan  Transportation Screening Complete Complete Complete  PCP or Specialist Appt within 5-7 Days   Not Complete  Home Care Screening   Complete  Medication Review (RN CM)   Complete  HRI or Home Care Consult  Complete   Social Work Consult for Recovery Care Planning/Counseling  Complete    Palliative Care Screening  Not Applicable   Medication Review Oceanographer) Complete Complete   HRI or Home Care Consult Complete    SW Recovery Care/Counseling Consult Complete    Palliative Care Screening Not Applicable    Skilled Nursing Facility Not Applicable

## 2023-06-27 ENCOUNTER — Ambulatory Visit: Payer: Medicare HMO | Admitting: Family Medicine

## 2023-06-27 DIAGNOSIS — R5381 Other malaise: Secondary | ICD-10-CM

## 2023-06-27 DIAGNOSIS — I5021 Acute systolic (congestive) heart failure: Secondary | ICD-10-CM | POA: Diagnosis not present

## 2023-06-27 DIAGNOSIS — I5020 Unspecified systolic (congestive) heart failure: Secondary | ICD-10-CM | POA: Diagnosis not present

## 2023-06-27 DIAGNOSIS — I10 Essential (primary) hypertension: Secondary | ICD-10-CM | POA: Diagnosis not present

## 2023-06-27 DIAGNOSIS — D469 Myelodysplastic syndrome, unspecified: Secondary | ICD-10-CM | POA: Diagnosis not present

## 2023-06-27 LAB — COMPREHENSIVE METABOLIC PANEL
ALT: 23 U/L (ref 0–44)
AST: 26 U/L (ref 15–41)
Albumin: 2.3 g/dL — ABNORMAL LOW (ref 3.5–5.0)
Alkaline Phosphatase: 345 U/L — ABNORMAL HIGH (ref 38–126)
Anion gap: 6 (ref 5–15)
BUN: 30 mg/dL — ABNORMAL HIGH (ref 8–23)
CO2: 28 mmol/L (ref 22–32)
Calcium: 7.3 mg/dL — ABNORMAL LOW (ref 8.9–10.3)
Chloride: 97 mmol/L — ABNORMAL LOW (ref 98–111)
Creatinine, Ser: 1.39 mg/dL — ABNORMAL HIGH (ref 0.61–1.24)
GFR, Estimated: 51 mL/min — ABNORMAL LOW (ref >60)
Glucose, Bld: 115 mg/dL — ABNORMAL HIGH (ref 70–99)
Potassium: 3.2 mmol/L — ABNORMAL LOW (ref 3.5–5.1)
Sodium: 131 mmol/L — ABNORMAL LOW (ref 135–145)
Total Bilirubin: 2.7 mg/dL — ABNORMAL HIGH (ref 0.3–1.2)
Total Protein: 5.6 g/dL — ABNORMAL LOW (ref 6.5–8.1)

## 2023-06-27 LAB — CBC
HCT: 24.5 % — ABNORMAL LOW (ref 39.0–52.0)
Hemoglobin: 7.1 g/dL — ABNORMAL LOW (ref 13.0–17.0)
MCH: 32 pg (ref 26.0–34.0)
MCHC: 29 g/dL — ABNORMAL LOW (ref 30.0–36.0)
MCV: 110.4 fL — ABNORMAL HIGH (ref 80.0–100.0)
Platelets: 174 10*3/uL (ref 150–400)
RBC: 2.22 MIL/uL — ABNORMAL LOW (ref 4.22–5.81)
RDW: 25.5 % — ABNORMAL HIGH (ref 11.5–15.5)
WBC: 13 10*3/uL — ABNORMAL HIGH (ref 4.0–10.5)
nRBC: 0.2 % (ref 0.0–0.2)

## 2023-06-27 MED ORDER — GUAIFENESIN 100 MG/5ML PO LIQD
5.0000 mL | ORAL | Status: DC | PRN
  Administered 2023-06-27 – 2023-06-29 (×4): 5 mL via ORAL
  Filled 2023-06-27 (×4): qty 5

## 2023-06-27 MED ORDER — MIDODRINE HCL 5 MG PO TABS
5.0000 mg | ORAL_TABLET | Freq: Three times a day (TID) | ORAL | Status: DC
  Administered 2023-06-27 – 2023-06-28 (×3): 5 mg via ORAL
  Filled 2023-06-27 (×3): qty 1

## 2023-06-27 NOTE — Progress Notes (Signed)
Progress Note  Patient Name: Leonard Cisneros Date of Encounter: 06/27/2023  Primary Cardiologist: [name], MD  Interval Summary   No shortness of breath or palpitations at rest.  Ate breakfast this morning.  States that he got better sleep last night.  No acute events per chart review.  He did meet with palliative care and also PT.  Still on norepinephrine along with midodrine which was initiated yesterday.  Diuresing on IV Lasix infusion.  Vital Signs    Vitals:   06/27/23 0615 06/27/23 0630 06/27/23 0645 06/27/23 0815  BP: (!) 95/52 (!) 91/48 117/62 (!) 96/54  Pulse: 97 (!) 104 (!) 122 (!) 34  Resp: 20  (!) 43 (!) 23  Temp:      TempSrc:      SpO2: (!) 88% 98% 100% 97%  Weight:      Height:        Intake/Output Summary (Last 24 hours) at 06/27/2023 0837 Last data filed at 06/27/2023 0657 Gross per 24 hour  Intake 1242.07 ml  Output 2550 ml  Net -1307.93 ml   Filed Weights   06/24/23 1322 06/25/23 1112  Weight: 94.8 kg 94.1 kg    Physical Exam   GEN: No acute distress.   Neck: CVP 18. Cardiac: Irregularly irregular, no gallop.  Respiratory: Nonlabored.  Decreased basilar breath sounds. GI: Soft, nontender, bowel sounds present. MS: Legs wrapped as before. Neuro:  Nonfocal. Psych: Alert and oriented x 3. Normal affect.  ECG/Telemetry    Telemetry reviewed showing atrial fibrillation.  Labs    Chemistry Recent Labs  Lab 06/25/23 0305 06/25/23 1131 06/26/23 0514 06/27/23 0445  NA 133*  --  134* 131*  K 3.8  --  3.4* 3.2*  CL 99  --  100 97*  CO2 26  --  27 28  GLUCOSE 90  --  129* 115*  BUN 33*  --  36* 30*  CREATININE 1.50*  --  1.52* 1.39*  CALCIUM 7.7*  --  7.5* 7.3*  PROT  --  5.5* 5.3* 5.6*  ALBUMIN  --  2.4* 2.3* 2.3*  AST  --  28 21 26   ALT  --  25 22 23   ALKPHOS  --  338* 316* 345*  BILITOT  --  3.0* 2.9* 2.7*  GFRNONAA 46*  --  46* 51*  ANIONGAP 8  --  7 6    Hematology Recent Labs  Lab 06/25/23 0305 06/26/23 0514  06/27/23 0445  WBC 12.4* 14.1* 13.0*  RBC 1.94* 2.20* 2.22*  HGB 6.5* 7.2* 7.1*  HCT 21.3* 23.8* 24.5*  MCV 109.8* 108.2* 110.4*  MCH 33.5 32.7 32.0  MCHC 30.5 30.3 29.0*  RDW 26.4* 25.7* 25.5*  PLT 167 187 174   Cardiac Enzymes Recent Labs  Lab 06/24/23 1435 06/24/23 1703  TROPONINIHS 14 14    Cardiac Studies   Echocardiogram 06/25/2023:  1. Left ventricular ejection fraction, by estimation, is 30 to 35%. The  left ventricle has moderately decreased function. Left ventricular  diastolic parameters are indeterminate.   2. RV not well visualized. Grossly appears mildly enlarged with mildly  decreased function. . Right ventricular systolic function was not well  visualized. The right ventricular size is not well visualized. There is  mildly elevated pulmonary artery  systolic pressure.   3. Moderate mitral valve regurgitation. Mild to moderate mitral stenosis.  The mean mitral valve gradient is 6.0 mmHg. Moderate mitral annular  calcification.   4. The aortic valve has an  indeterminant number of cusps. There is severe  calcifcation of the aortic valve. There is severe thickening of the aortic  valve. Aortic valve regurgitation is not visualized. Moderate aortic valve  stenosis. Aortic valve mean  gradient measures 23.5 mmHg. Aortic valve peak gradient measures 42.2  mmHg. Aortic valve area, by VTI measures 1.06 cm.     5. The inferior vena cava is dilated in size with <50% respiratory  variability, suggesting right atrial pressure of 15 mmHg.   Assessment & Plan   1.  Acute on chronic HFrEF with fluid overload.  Presenting weight up 10 to 15 pounds over prior baseline.  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  GDMT limited by relative hypotension.  Tolerating Lasix infusion with net urine output of approximately 1100 cc last 24 hours.  Midodrine started yesterday to hopefully allow weaning of norepinephrine.  Co-ox has been in  normal range.  2.  Permanent atrial fibrillation with CHA2DS2-VASc score 5. Not candidate for anticoagulation as discussed in prior cardiology notes.  3.  Multivessel CAD status post CABG in 2003 with LIMA to LAD, RIMA to RCA, and SVG to ramus intermedius. Also status post BMS to SVG to ramus intermedius in 2017, but ultimately found to have occlusion of the SVG to ramus intermedius in January of this year managed medically.  4.  Moderate, degenerative calcific aortic stenosis.  Mean AV gradient approximately 24 mmHg by follow-up echocardiogram.  5.  Moderate mitral regurgitation with mild to moderate mitral stenosis in the setting of annular calcification.  6.  Acute kidney injury, creatinine down to 1.39.  Possibly ATN in the setting of hypotension.  7.  Severe anemia with MDS.  Hemoglobin 7.1.  Uptitrate midodrine to 5 mg three times daily and attempt to wean norepinephrine.  Maintain Lasix infusion and start potassium supplement.  Optimize fluid status as much as possible and then refocus on GDMT as able.  For questions or updates, please contact [facility] Please consult [email or web address] for contact info under   Signed, [name], MD  06/27/2023, 8:37 AM

## 2023-06-27 NOTE — Progress Notes (Signed)
[facility]                                                                               Camryn Eisenreich, is a 82 y.o. male, DOB - [birth year], UUV:[id number] Admit date - 06-26-23    Outpatient Primary MD for the patient is [name], FNP  LOS - 3  days    Brief summary   GUISEPPE LUMMIS is a 82 y.o. male with medical history significant of EF=30% months ago, CAD, h/o CABG, afib, mild AS and mild Mitral stenosis.  His last cath was 09/2022., h/o myelodysplastic syndrome after a bone marrow biopsy in July 2024  and on Aranesp 500 mcg every 2 weeks, but also has history of duodenal ulcer,  internal hemorrhoids, presents with generalized weakness, sob.    CXR showing Moderate congestive heart failure/pulmonary edema. Small to moderate bilateral pleural effusions with associated compressive atelectatic changes, worsened since the prior study.      Assessment & Plan    Assessment and Plan: Acute Severe systolic CHF on chronic HFrEF with anasarca and lower extremity edema.  Lst echo shows LVEF of 30 to 35%.  -Continue IV diuresis with Lasix infusion. -GDMT currently limited secondary to low blood pressure and renal function. -Continue to wean off Levophed as tolerated; continue the use of midodrine. -Follow clinical response -Lactic acid within normal limits -Appreciate assistance and recommendation by cardiology service.  CAD S/P CABG in 2003.  Cath in 09/2022 showed patent LIMA-LAD and RIMA-PDA with occluded SVG-RI which appeared to be chronic and med management was recommended.  Further ischemic testing not done due to severe anemia.  -Continue statin -Continue to follow cardiology service recommendations.  Permanent Atrial fibrillation  --Not on anti coagulation due to h/o GI bleed and MDS and symptomatic anemia.  -Planning to restart pt on toprol 25 mg once BP improves.  -For the most part rate is controlled -Continue to follow cardiology service  recommendation.  Hyponatremia: Secondary to fluid overload.  -Continue to follow electrolytes with diuresis.    MILD AKI -Baseline creatinine around 1, creatinine at time of admission 1.5 probably from CHF. / cardio renal syndrome attention -Continue the use of pressor supports, midodrine Lasix -Continue to follow renal function trend. -Continue diuresis; 1.39 SS creatinine today.  Leukocytosis:  -Unclear etiology.  -Most likely stress demargination. -No acute findings to suggest infection -Continue following WBCs trend.  MDS explaining his anemia:  Hemoglobin around 6.5 on 06/25/2023 -1 unit PRBCs transfused -Hemoglobin up to 7.2 -Continue to follow hemoglobin trend.  H/o PUD; -Continue PPI. -No overt bleeding appreciated.   Code Status: DNR. DVT Prophylaxis:  SCD'S   Level of Care: Level of care: ICU  Family Communication: No family at bedside.   Disposition Plan:     Remains inpatient appropriate:  IV lasix, pressor support.   Procedures:  See below for x-ray reports.  Consultants:   Cardiology.  Palliative care.   Antimicrobials:   Anti-infectives (From admission, onward)    None        Medications  Scheduled Meds:  sodium chloride   Intravenous Once   atorvastatin  80 mg Oral Daily   Chlorhexidine  Gluconate Cloth  6 each Topical Daily   melatonin  6 mg Oral QHS   midodrine  5 mg Oral TID WC   pantoprazole  40 mg Oral BID   sodium chloride flush  10-40 mL Intracatheter Q12H   Continuous Infusions:  furosemide (LASIX) 200 mg in dextrose 5 % 100 mL (2 mg/mL) infusion 5 mg/hr (06/27/23 1759)   norepinephrine (LEVOPHED) Adult infusion 5 mcg/min (06/27/23 1759)   PRN Meds:.acetaminophen **OR** acetaminophen, guaiFENesin, ondansetron **OR** ondansetron (ZOFRAN) IV, sodium chloride flush, sorbitol   Subjective:   Kathrynn Ducking no chest pain, no nausea, no vomiting.  Patient continued to be short of breath and demonstrating signs of fluid  overload.  Still requiring the use of Levophed to maintain adequate pressure readings.  Objective:   Vitals:   06/27/23 1745 06/27/23 1800 06/27/23 1815 06/27/23 1830  BP:   (!) 106/53 (!) 100/56  Pulse: 100 94  99  Resp: 13 (!) 24 20 (!) 23  Temp:      TempSrc:      SpO2: 94% 100% 99% 100%  Weight:      Height:        Intake/Output Summary (Last 24 hours) at 06/27/2023 1838 Last data filed at 06/27/2023 1759 Gross per 24 hour  Intake 1200.66 ml  Output 2000 ml  Net -799.34 ml   Filed Weights   07/03/2023 1322 06/25/23 1112  Weight: 94.8 kg 94.1 kg     Exam General exam: Alert, awake, oriented x 3; still short winded with minimal activity and demonstrating signs of fluid overload.  Reports no chest pain, no nausea, no vomiting. Respiratory system: Positive crackles and congestion sounds on exam.  2 L nasal cannula in place. Cardiovascular system: Irregular rhythm, positive soft murmur, no rubs, no gallops Gastrointestinal system: Abdomen is soft and nontender; positive distention and increased abdominal girth appreciated From fluid overload.  Positive bowel sounds. Central nervous system: Moving 4 limbs spontaneously.  No focal neurological deficits. Extremities: No cyanosis or clubbing; 3+ edema appreciated bilaterally.  Positive SSA dermatitis appreciated bilaterally. Skin: No petechiae. Psychiatry: Judgement and insight appear normal. Mood & affect appropriate.   Data Reviewed:  I have personally reviewed following labs and imaging studies   CBC Lab Results  Component Value Date   WBC 13.0 (H) 06/27/2023   RBC 2.22 (L) 06/27/2023   HGB 7.1 (L) 06/27/2023   HCT 24.5 (L) 06/27/2023   MCV 110.4 (H) 06/27/2023   MCH 32.0 06/27/2023   PLT 174 06/27/2023   MCHC 29.0 (L) 06/27/2023   RDW 25.5 (H) 06/27/2023   LYMPHSABS 0.8 06/17/2023   MONOABS 0.8 06/17/2023   EOSABS 0.2 06/17/2023   BASOSABS 0.0 06/17/2023     Last metabolic panel Lab Results  Component  Value Date   NA 131 (L) 06/27/2023   K 3.2 (L) 06/27/2023   CL 97 (L) 06/27/2023   CO2 28 06/27/2023   BUN 30 (H) 06/27/2023   CREATININE 1.39 (H) 06/27/2023   GLUCOSE 115 (H) 06/27/2023   GFRNONAA 51 (L) 06/27/2023   GFRAA 97 07/19/2020   CALCIUM 7.3 (L) 06/27/2023   PHOS 3.9 04/08/2023   PROT 5.6 (L) 06/27/2023   ALBUMIN 2.3 (L) 06/27/2023   LABGLOB 2.6 04/03/2023   AGRATIO 1.4 11/29/2022   BILITOT 2.7 (H) 06/27/2023   ALKPHOS 345 (H) 06/27/2023   AST 26 06/27/2023   ALT 23 06/27/2023   ANIONGAP 6 06/27/2023    CBG (last 3)  Recent Labs  06/26/23 1130  GLUCAP 147*      Coagulation Profile: No results for input(s): "INR", "PROTIME" in the last 168 hours.   Radiology Studies: No results found.  CRITICAL CARE Performed by: Vassie Loll   Total critical care time: 55 minutes  Critical care time was exclusive of separately billable procedures and treating other patients.  Critical care was necessary to treat or prevent imminent or life-threatening deterioration.  Critical care was time spent personally by me on the following activities: development of treatment plan with patient and/or surrogate as well as nursing, discussions with consultants, evaluation of patient's response to treatment, examination of patient, obtaining history from patient or surrogate, ordering and performing treatments and interventions, ordering and review of laboratory studies, ordering and review of radiographic studies, pulse oximetry and re-evaluation of patient's condition.   Vassie Loll M.D. [facility] 06/27/2023, 6:38 PM  Available via Epic secure chat 7am-7pm After 7 pm, please refer to night coverage provider listed on amion.

## 2023-06-27 NOTE — Plan of Care (Signed)

## 2023-06-28 ENCOUNTER — Encounter: Payer: Self-pay | Admitting: Hematology

## 2023-06-28 DIAGNOSIS — Z515 Encounter for palliative care: Secondary | ICD-10-CM | POA: Diagnosis not present

## 2023-06-28 DIAGNOSIS — I5023 Acute on chronic systolic (congestive) heart failure: Secondary | ICD-10-CM | POA: Diagnosis not present

## 2023-06-28 DIAGNOSIS — N179 Acute kidney failure, unspecified: Secondary | ICD-10-CM | POA: Diagnosis not present

## 2023-06-28 DIAGNOSIS — I872 Venous insufficiency (chronic) (peripheral): Secondary | ICD-10-CM | POA: Diagnosis not present

## 2023-06-28 DIAGNOSIS — I4891 Unspecified atrial fibrillation: Secondary | ICD-10-CM | POA: Diagnosis not present

## 2023-06-28 DIAGNOSIS — Z7189 Other specified counseling: Secondary | ICD-10-CM | POA: Diagnosis not present

## 2023-06-28 DIAGNOSIS — I5021 Acute systolic (congestive) heart failure: Secondary | ICD-10-CM | POA: Diagnosis not present

## 2023-06-28 DIAGNOSIS — I5020 Unspecified systolic (congestive) heart failure: Secondary | ICD-10-CM | POA: Diagnosis not present

## 2023-06-28 LAB — TYPE AND SCREEN
ABO/RH(D): O POS
Antibody Screen: POSITIVE
DAT, IgG: NEGATIVE
Unit division: 0
Unit division: 0

## 2023-06-28 LAB — BPAM RBC
Blood Product Expiration Date: 202411202359
Blood Product Expiration Date: 202411202359
ISSUE DATE / TIME: 202410222223
ISSUE DATE / TIME: 202410222223
Unit Type and Rh: 5100
Unit Type and Rh: 5100

## 2023-06-28 LAB — COOXEMETRY PANEL
Carboxyhemoglobin: 3.6 % — ABNORMAL HIGH (ref 0.5–1.5)
Methemoglobin: <0.7 % (ref 0.0–1.5)
O2 Saturation: 86.5 %
Total hemoglobin: 7.2 g/dL — ABNORMAL LOW (ref 12.0–16.0)

## 2023-06-28 LAB — CBC
HCT: 24.2 % — ABNORMAL LOW (ref 39.0–52.0)
Hemoglobin: 7.3 g/dL — ABNORMAL LOW (ref 13.0–17.0)
MCH: 33.3 pg (ref 26.0–34.0)
MCHC: 30.2 g/dL (ref 30.0–36.0)
MCV: 110.5 fL — ABNORMAL HIGH (ref 80.0–100.0)
Platelets: 177 10*3/uL (ref 150–400)
RBC: 2.19 MIL/uL — ABNORMAL LOW (ref 4.22–5.81)
RDW: 25.1 % — ABNORMAL HIGH (ref 11.5–15.5)
WBC: 10.7 10*3/uL — ABNORMAL HIGH (ref 4.0–10.5)
nRBC: 0.2 % (ref 0.0–0.2)

## 2023-06-28 LAB — COMPREHENSIVE METABOLIC PANEL
ALT: 24 U/L (ref 0–44)
AST: 27 U/L (ref 15–41)
Albumin: 2.3 g/dL — ABNORMAL LOW (ref 3.5–5.0)
Alkaline Phosphatase: 360 U/L — ABNORMAL HIGH (ref 38–126)
Anion gap: 8 (ref 5–15)
BUN: 32 mg/dL — ABNORMAL HIGH (ref 8–23)
CO2: 29 mmol/L (ref 22–32)
Calcium: 7.8 mg/dL — ABNORMAL LOW (ref 8.9–10.3)
Chloride: 95 mmol/L — ABNORMAL LOW (ref 98–111)
Creatinine, Ser: 1.41 mg/dL — ABNORMAL HIGH (ref 0.61–1.24)
GFR, Estimated: 50 mL/min — ABNORMAL LOW (ref >60)
Glucose, Bld: 127 mg/dL — ABNORMAL HIGH (ref 70–99)
Potassium: 3.6 mmol/L (ref 3.5–5.1)
Sodium: 132 mmol/L — ABNORMAL LOW (ref 135–145)
Total Bilirubin: 2.6 mg/dL — ABNORMAL HIGH (ref 0.3–1.2)
Total Protein: 5.7 g/dL — ABNORMAL LOW (ref 6.5–8.1)

## 2023-06-28 MED ORDER — POTASSIUM CHLORIDE 20 MEQ PO PACK
20.0000 meq | PACK | Freq: Every day | ORAL | Status: DC
  Administered 2023-06-28 – 2023-06-29 (×2): 20 meq via ORAL
  Filled 2023-06-28 (×2): qty 1

## 2023-06-28 MED ORDER — MIDODRINE HCL 5 MG PO TABS
10.0000 mg | ORAL_TABLET | Freq: Three times a day (TID) | ORAL | Status: DC
  Administered 2023-06-28 – 2023-06-29 (×3): 10 mg via ORAL
  Filled 2023-06-28 (×3): qty 2

## 2023-06-28 NOTE — Progress Notes (Signed)
Palliative:  HPI: 82 y.o. male  with past medical history of HFrEF EF 30%, CAD, s/p CABG, atrial fibrillation s/p ablation, mild AS, mild mitral stenosis, HTN, HLD, peripheral neuropathy, chronic low back, myelodysplastic syndrome, anemia, duodenal ulcer, GIB pain admitted on 06/24/2023 with recurrent heart failure exacerbation and anemia. Hospitalization complicated by AKI and hypotension requiring vasopressor support.   I met today at Leonard Cisneros's bedside along with wife and sister. Leonard Cisneros is lethargic today and not able to contribute to conversation. He is uncomfortable after having foley catheter placed. He continues on vasopressor support. Urine output is not as good but kidney function is stable. Wife understands that he is very ill and we will continue to try and diurese but I am worried about his status today. Continued hope for some level of improvement but wife does understand how ill he is. I provided her with MOST form to review. She reports that she does know her husband and what he would want. They are both very practical people and understand the limitations of medicine. Continued time for outcomes.   All questions/concerns addressed. Emotional support provided.   Exam: More lethargic. Mumbles. Breathing - no distress but poor reserve. Increased effort even at rest. Abd soft. Warm to touch. BLE edema.   Plan: - DNR - Continue current measures - Overall poor prognosis  40 min  Yong Channel, NP Palliative Medicine Team Pager [phone] (Please see [email or web address] for schedule) Team Phone [phone]    Greater than 50%  of this time was spent counseling and coordinating care related to the above assessment and plan

## 2023-06-28 NOTE — Plan of Care (Signed)
  Problem: Education: Goal: Knowledge of General Education information will improve Description: Including pain rating scale, medication(s)/side effects and non-pharmacologic comfort measures Outcome: Progressing   Problem: Health Behavior/Discharge Planning: Goal: Ability to manage health-related needs will improve Outcome: Progressing   Problem: Clinical Measurements: Goal: Will remain free from infection Outcome: Progressing Goal: Diagnostic test results will improve Outcome: Progressing Goal: Respiratory complications will improve Outcome: Progressing Goal: Cardiovascular complication will be avoided Outcome: Progressing   Problem: Nutrition: Goal: Adequate nutrition will be maintained Outcome: Progressing   Problem: Coping: Goal: Level of anxiety will decrease Outcome: Progressing   Problem: Elimination: Goal: Will not experience complications related to bowel motility Outcome: Progressing Goal: Will not experience complications related to urinary retention Outcome: Progressing   Problem: Pain Managment: Goal: General experience of comfort will improve Outcome: Progressing   Problem: Safety: Goal: Ability to remain free from injury will improve Outcome: Progressing

## 2023-06-28 NOTE — Progress Notes (Signed)
Progress Note  Patient Name: Leonard Cisneros Date of Encounter: 06/28/2023  Primary Cardiologist: [name], MD  Interval Summary   Chart reviewed, patient has had recurring cough since last night, intermittently productive.  Also edematous with some skin weeping.  Urine output incomplete but less than the prior 24 hours.  Vital Signs    Vitals:   06/28/23 0745 06/28/23 0800 06/28/23 0815 06/28/23 0830  BP: (!) 104/43 (!) 95/44 (!) 96/52   Pulse: (!) 107 90  (!) 106  Resp: 19 (!) 23 17 (!) 26  Temp:      TempSrc:      SpO2: 99% 99% 99% 98%  Weight:      Height:        Intake/Output Summary (Last 24 hours) at 06/28/2023 0845 Last data filed at 06/28/2023 0640 Gross per 24 hour  Intake 805.57 ml  Output 750 ml  Net 55.57 ml   Filed Weights   06/24/23 1322 06/25/23 1112  Weight: 94.8 kg 94.1 kg    Physical Exam   GEN: No acute distress.   Neck: CVP 20. Cardiac: Irregularly irregular, no gallop.  Respiratory: Nonlabored.  Decreased basilar breath sounds. GI: Soft, nontender, bowel sounds present. MS: Legs wrapped as before.  Hands also edematous. Neuro:  Nonfocal. Psych: Alert and oriented x 3. Normal affect.  ECG/Telemetry    Telemetry reviewed showing atrial fibrillation.  Labs    Chemistry Recent Labs  Lab 06/26/23 0514 06/27/23 0445 06/28/23 0452  NA 134* 131* 132*  K 3.4* 3.2* 3.6  CL 100 97* 95*  CO2 27 28 29   GLUCOSE 129* 115* 127*  BUN 36* 30* 32*  CREATININE 1.52* 1.39* 1.41*  CALCIUM 7.5* 7.3* 7.8*  PROT 5.3* 5.6* 5.7*  ALBUMIN 2.3* 2.3* 2.3*  AST 21 26 27   ALT 22 23 24   ALKPHOS 316* 345* 360*  BILITOT 2.9* 2.7* 2.6*  GFRNONAA 46* 51* 50*  ANIONGAP 7 6 8     Hematology Recent Labs  Lab 06/26/23 0514 06/27/23 0445 06/28/23 0452  WBC 14.1* 13.0* 10.7*  RBC 2.20* 2.22* 2.19*  HGB 7.2* 7.1* 7.3*  HCT 23.8* 24.5* 24.2*  MCV 108.2* 110.4* 110.5*  MCH 32.7 32.0 33.3  MCHC 30.3 29.0* 30.2  RDW 25.7* 25.5* 25.1*  PLT  187 174 177   Cardiac Enzymes Recent Labs  Lab 06/24/23 1435 06/24/23 1703  TROPONINIHS 14 14    Cardiac Studies   Echocardiogram 06/25/2023:  1. Left ventricular ejection fraction, by estimation, is 30 to 35%. The  left ventricle has moderately decreased function. Left ventricular  diastolic parameters are indeterminate.   2. RV not well visualized. Grossly appears mildly enlarged with mildly  decreased function. . Right ventricular systolic function was not well  visualized. The right ventricular size is not well visualized. There is  mildly elevated pulmonary artery  systolic pressure.   3. Moderate mitral valve regurgitation. Mild to moderate mitral stenosis.  The mean mitral valve gradient is 6.0 mmHg. Moderate mitral annular  calcification.   4. The aortic valve has an indeterminant number of cusps. There is severe  calcifcation of the aortic valve. There is severe thickening of the aortic  valve. Aortic valve regurgitation is not visualized. Moderate aortic valve  stenosis. Aortic valve mean  gradient measures 23.5 mmHg. Aortic valve peak gradient measures 42.2  mmHg. Aortic valve area, by VTI measures 1.06 cm.     5. The inferior vena cava is dilated in size with <50% respiratory  variability, suggesting right atrial pressure of 15 mmHg.   Assessment & Plan   1.  Acute on chronic HFrEF with fluid overload.  Presenting weight up 10 to 15 pounds over prior baseline.  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  GDMT limited by relative hypotension.  Recent urine output incomplete, follow-up bed weight pending.  2.  Permanent atrial fibrillation with CHA2DS2-VASc score 5. Not candidate for anticoagulation as discussed in prior cardiology notes.  3.  Multivessel CAD status post CABG in 2003 with LIMA to LAD, RIMA to RCA, and SVG to ramus intermedius. Also status post BMS to SVG to ramus intermedius in 2017, but ultimately found to have  occlusion of the SVG to ramus intermedius in January of this year managed medically.  4.  Moderate, degenerative calcific aortic stenosis.  Mean AV gradient approximately 24 mmHg by follow-up echocardiogram.  5.  Moderate mitral regurgitation with mild to moderate mitral stenosis in the setting of annular calcification.  6.  Acute kidney injury, creatinine relatively stable at 1.41.  Possibly ATN in the setting of hypotension.  7.  Severe anemia with MDS.  Hemoglobin 7.3.  Plan to increase midodrine to hopefully allow further wean of norepinephrine.  Also uptitrate Lasix infusion in place Foley catheter.  Repeat co-ox per PICC (last few measurements were normal).  Not responding optimally to medical therapy and prognosis is guarded.  He did meet with palliative care and is currently DNR.  Continue supportive measures.  For questions or updates, please contact [facility] Please consult [email or web address] for contact info under   Signed, [name], MD  06/28/2023, 8:45 AM

## 2023-06-28 NOTE — Plan of Care (Signed)
  Problem: Education: Goal: Knowledge of General Education information will improve Description: Including pain rating scale, medication(s)/side effects and non-pharmacologic comfort measures Outcome: Progressing   Problem: Elimination: Goal: Will not experience complications related to urinary retention Outcome: Progressing   

## 2023-06-28 NOTE — Care Management Important Message (Signed)
Important Message  Patient Details  Name: Leonard Cisneros MRN: [id number] Date of Birth: 06/03/1941   Important Message Given:  Yes - Medicare IM     Corey Harold 06/28/2023, 1:38 PM

## 2023-06-28 NOTE — Progress Notes (Signed)
   06/28/23 1050  Spiritual Encounters  Type of Visit Initial  Care provided to: Pt not available  Reason for visit Routine spiritual support  OnCall Visit No   Chaplain attempted to visit with patient, Borna during rounding on the unit. Patient was receiving care.   Valerie Roys [facility]  [phone]

## 2023-06-28 NOTE — Telephone Encounter (Signed)
Do you know how to send those orders to hospital?

## 2023-06-28 NOTE — Progress Notes (Signed)
Palliative:  HPI: 82 y.o. male  with past medical history of HFrEF EF 30%, CAD, s/p CABG, atrial fibrillation s/p ablation, mild AS, mild mitral stenosis, HTN, HLD, peripheral neuropathy, chronic low back, myelodysplastic syndrome, anemia, duodenal ulcer, GIB pain admitted on 06/23/2023 with recurrent heart failure exacerbation and anemia. Hospitalization complicated by AKI and hypotension requiring vasopressor support.   I met today with Leonard Cisneros. He is sitting up in recliner again today. He continues in overall good spirits and without complaints. He appears fatigued and weak.  He does have evidence of more congestion and fluid overload. Wet cough evident. Orlene Erm is limited by his coughing today. He expresses his desire for ongoing care with current interventions with hopes of improvement. He understands the severity of his condition and overall poor prognosis. Time for outcomes.   All questions/concerns addressed. Emotional support provided.   Exam: Alert, oriented. + wet cough, congestion.  Tachycardic - afib. Breathing regular, unlabored although poor reserve. Abd flat. BLE edema.   Plan: - DNR - Continue current interventions - Time for outcomes  25 min  Leonard Channel, NP Palliative Medicine Team Pager [phone] (Please see [email or web address] for schedule) Team Phone [phone]    Greater than 50%  of this time was spent counseling and coordinating care related to the above assessment and plan

## 2023-06-28 NOTE — Progress Notes (Signed)
PT Cancellation Note  Patient Details Name: Leonard Cisneros MRN: [id number] DOB: 22-Feb-1941   Cancelled Treatment:    Reason Eval/Treat Not Completed: Fatigue/lethargy limiting ability to participate.  Patient lethargic per nursing staff and unable to participate with therapy today.   3:34 PM, 06/28/23 Ocie Bob, MPT Physical Therapist with [facility] [phone] office [phone] mobile phone

## 2023-06-28 NOTE — Progress Notes (Signed)
[facility]                                                                               Leonard Cisneros, is a 82 y.o. male, DOB - Apr 13, 1941, ZOX:[id number] Admit date - 07/11/23    Outpatient Primary MD for the patient is [name], FNP  LOS - 4  days    Brief summary   Leonard Cisneros is a 82 y.o. male with medical history significant of EF=30% months ago, CAD, h/o CABG, afib, mild AS and mild Mitral stenosis.  His last cath was 09/2022., h/o myelodysplastic syndrome after a bone marrow biopsy in July 2024  and on Aranesp 500 mcg every 2 weeks, but also has history of duodenal ulcer,  internal hemorrhoids, presents with generalized weakness, sob.    CXR showing Moderate congestive heart failure/pulmonary edema. Small to moderate bilateral pleural effusions with associated compressive atelectatic changes, worsened since the prior study.      Assessment & Plan    Assessment and Plan: Acute Severe systolic CHF on chronic HFrEF with anasarca and lower extremity edema.  Lst echo shows LVEF of 30 to 35%.  -Continue IV diuresis with Lasix infusion. -GDMT currently limited secondary to low blood pressure and renal function. -Continue to wean off Levophed as tolerated; continue the use of midodrine. -Follow clinical response -Lactic acid within normal limits -Appreciate assistance and recommendation by cardiology service. -Overall prognosis guarded.  CAD S/P CABG in 2003.  Cath in 09/2022 showed patent LIMA-LAD and RIMA-PDA with occluded SVG-RI which appeared to be chronic and med management was recommended.  Further ischemic testing not done due to severe anemia.  -Continue statin -Continue to follow cardiology service recommendations.  Permanent Atrial fibrillation  --Not on anti coagulation due to h/o GI bleed and MDS and symptomatic anemia.  -Planning to restart pt on toprol 25 mg once BP improves.  -For the most part rate is controlled -Continue  to follow cardiology service recommendation.  Hyponatremia: Secondary to fluid overload.  -Continue to follow electrolytes with diuresis.    MILD AKI -Baseline creatinine around 1, creatinine at time of admission 1.5 probably from CHF. / cardio renal syndrome attention -Continue the use of pressor supports, midodrine Lasix -Continue to follow renal function trend. -Continue diuresis; 1.39 SS creatinine today.  Leukocytosis:  -Unclear etiology.  -Most likely stress demargination. -No acute findings to suggest infection -Continue following WBCs trend.  MDS explaining his anemia:  Hemoglobin around 6.5 on 06/25/2023 -1 unit PRBCs transfused -Hemoglobin up to 7.2 -Continue to follow hemoglobin trend.  H/o PUD; -Continue PPI. -No overt bleeding appreciated.  Goals of care discussion/advance care planning -Appreciate assistance and recommendation by palliative care -Patient DNR/DNI.   Code Status: DNR. DVT Prophylaxis:  SCD'S   Level of Care: Level of care: ICU  Family Communication: No family at bedside on today's examination..   Disposition Plan:     Remains inpatient appropriate:  IV lasix, pressor support.   Procedures:  See below for x-ray reports.  Consultants:   Cardiology.  Palliative care.   Antimicrobials:   Anti-infectives (From admission, onward)    None  Medications  Scheduled Meds:  sodium chloride   Intravenous Once   atorvastatin  80 mg Oral Daily   Chlorhexidine Gluconate Cloth  6 each Topical Daily   melatonin  6 mg Oral QHS   midodrine  10 mg Oral TID WC   pantoprazole  40 mg Oral BID   potassium chloride  20 mEq Oral Daily   sodium chloride flush  10-40 mL Intracatheter Q12H   Continuous Infusions:  furosemide (LASIX) 200 mg in dextrose 5 % 100 mL (2 mg/mL) infusion 8 mg/hr (06/28/23 1549)   norepinephrine (LEVOPHED) Adult infusion 8 mcg/min (06/28/23 1612)   PRN Meds:.acetaminophen **OR** acetaminophen, guaiFENesin,  ondansetron **OR** ondansetron (ZOFRAN) IV, sodium chloride flush, sorbitol   Subjective:   Leonard Cisneros no chest pain, no nausea, no vomiting.  Chronically ill in appearance.  Short winded with minimal exertion and demonstrating tachypnea.  Still requiring pressors.  Objective:   Vitals:   06/28/23 1530 06/28/23 1545 06/28/23 1558 06/28/23 1600  BP: (!) 98/45 (!) 101/40  (!) 104/55  Pulse: (!) 112 99  (!) 110  Resp: (!) 21 (!) 22  (!) 21  Temp:   98.2 F (36.8 C)   TempSrc:   Oral   SpO2: 98% 99%  98%  Weight:      Height:        Intake/Output Summary (Last 24 hours) at 06/28/2023 1658 Last data filed at 06/28/2023 1605 Gross per 24 hour  Intake 1361.27 ml  Output 950 ml  Net 411.27 ml   Filed Weights   06/07/2023 1322 06/25/23 1112 06/28/23 1245  Weight: 94.8 kg 94.1 kg 97.6 kg     Exam General exam: Alert, awake, oriented x 3; chronically ill in appearance.  Short winded with minimal activity and is still requiring pressor support. Respiratory system: Positive rhonchi, fine crackles at the bases and with fair air movement.  Intermittent tachypnea with minimal effort.  2 L nasal cannula in place. Cardiovascular system: Irregular, rate in the 105-110 range; no rubs, no gallops, positive murmur. Gastrointestinal system: Abdomen is nontender to palpation, positive bowel sounds, distended and demonstrating increased abdominal girth from anasarca. Central nervous system: Moving 4 limbs spontaneously.  Generally weak.  No focal neurological deficits. Extremities: No cyanosis or clubbing; 1-2+ edema bilaterally appreciated. Skin: No petechiae.  Positive bilateral stasis dermatitis changes. Psychiatry: Judgement and insight appear normal. Mood & affect appropriate.    CBC Lab Results  Component Value Date   WBC 10.7 (H) 06/28/2023   RBC 2.19 (L) 06/28/2023   HGB 7.3 (L) 06/28/2023   HCT 24.2 (L) 06/28/2023   MCV 110.5 (H) 06/28/2023   MCH 33.3 06/28/2023   PLT 177  06/28/2023   MCHC 30.2 06/28/2023   RDW 25.1 (H) 06/28/2023   LYMPHSABS 0.8 06/17/2023   MONOABS 0.8 06/17/2023   EOSABS 0.2 06/17/2023   BASOSABS 0.0 06/17/2023     Last metabolic panel Lab Results  Component Value Date   NA 132 (L) 06/28/2023   K 3.6 06/28/2023   CL 95 (L) 06/28/2023   CO2 29 06/28/2023   BUN 32 (H) 06/28/2023   CREATININE 1.41 (H) 06/28/2023   GLUCOSE 127 (H) 06/28/2023   GFRNONAA 50 (L) 06/28/2023   GFRAA 97 07/19/2020   CALCIUM 7.8 (L) 06/28/2023   PHOS 3.9 04/08/2023   PROT 5.7 (L) 06/28/2023   ALBUMIN 2.3 (L) 06/28/2023   LABGLOB 2.6 04/03/2023   AGRATIO 1.4 11/29/2022   BILITOT 2.6 (H) 06/28/2023   ALKPHOS 360 (  H) 06/28/2023   AST 27 06/28/2023   ALT 24 06/28/2023   ANIONGAP 8 06/28/2023    CBG (last 3)  Recent Labs    06/26/23 1130  GLUCAP 147*      Coagulation Profile: No results for input(s): "INR", "PROTIME" in the last 168 hours.   Radiology Studies: No results found.  CRITICAL CARE Performed by: Vassie Loll   Total critical care time: 50 minutes  Critical care time was exclusive of separately billable procedures and treating other patients.  Critical care was necessary to treat or prevent imminent or life-threatening deterioration.  Critical care was time spent personally by me on the following activities: development of treatment plan with patient and/or surrogate as well as nursing, discussions with consultants, evaluation of patient's response to treatment, examination of patient, obtaining history from patient or surrogate, ordering and performing treatments and interventions, ordering and review of laboratory studies, ordering and review of radiographic studies, pulse oximetry and re-evaluation of patient's condition.    Vassie Loll M.D. [facility] 06/28/2023, 4:58 PM  Available via Epic secure chat 7am-7pm After 7 pm, please refer to night coverage provider listed on amion.

## 2023-06-29 ENCOUNTER — Inpatient Hospital Stay (HOSPITAL_COMMUNITY): Payer: Medicare HMO

## 2023-06-29 DIAGNOSIS — K269 Duodenal ulcer, unspecified as acute or chronic, without hemorrhage or perforation: Secondary | ICD-10-CM

## 2023-06-29 DIAGNOSIS — I5023 Acute on chronic systolic (congestive) heart failure: Secondary | ICD-10-CM | POA: Diagnosis not present

## 2023-06-29 DIAGNOSIS — I4891 Unspecified atrial fibrillation: Secondary | ICD-10-CM | POA: Diagnosis not present

## 2023-06-29 DIAGNOSIS — I10 Essential (primary) hypertension: Secondary | ICD-10-CM | POA: Diagnosis not present

## 2023-06-29 DIAGNOSIS — N179 Acute kidney failure, unspecified: Secondary | ICD-10-CM | POA: Diagnosis not present

## 2023-06-29 LAB — COMPREHENSIVE METABOLIC PANEL
ALT: 25 U/L (ref 0–44)
AST: 28 U/L (ref 15–41)
Albumin: 2.4 g/dL — ABNORMAL LOW (ref 3.5–5.0)
Alkaline Phosphatase: 386 U/L — ABNORMAL HIGH (ref 38–126)
Anion gap: 7 (ref 5–15)
BUN: 33 mg/dL — ABNORMAL HIGH (ref 8–23)
CO2: 28 mmol/L (ref 22–32)
Calcium: 7.9 mg/dL — ABNORMAL LOW (ref 8.9–10.3)
Chloride: 95 mmol/L — ABNORMAL LOW (ref 98–111)
Creatinine, Ser: 1.5 mg/dL — ABNORMAL HIGH (ref 0.61–1.24)
GFR, Estimated: 46 mL/min — ABNORMAL LOW (ref >60)
Glucose, Bld: 122 mg/dL — ABNORMAL HIGH (ref 70–99)
Potassium: 3.9 mmol/L (ref 3.5–5.1)
Sodium: 130 mmol/L — ABNORMAL LOW (ref 135–145)
Total Bilirubin: 2.6 mg/dL — ABNORMAL HIGH (ref 0.3–1.2)
Total Protein: 5.7 g/dL — ABNORMAL LOW (ref 6.5–8.1)

## 2023-06-29 LAB — COOXEMETRY PANEL
Carboxyhemoglobin: 3.4 % — ABNORMAL HIGH (ref 0.5–1.5)
Methemoglobin: 0.8 % (ref 0.0–1.5)
O2 Saturation: 68.4 %
Total hemoglobin: 9.1 g/dL — ABNORMAL LOW (ref 12.0–16.0)

## 2023-06-29 LAB — GLUCOSE, CAPILLARY: Glucose-Capillary: 158 mg/dL — ABNORMAL HIGH (ref 70–99)

## 2023-06-29 LAB — CBC
HCT: 24.8 % — ABNORMAL LOW (ref 39.0–52.0)
Hemoglobin: 7.3 g/dL — ABNORMAL LOW (ref 13.0–17.0)
MCH: 32.3 pg (ref 26.0–34.0)
MCHC: 29.4 g/dL — ABNORMAL LOW (ref 30.0–36.0)
MCV: 109.7 fL — ABNORMAL HIGH (ref 80.0–100.0)
Platelets: 220 10*3/uL (ref 150–400)
RBC: 2.26 MIL/uL — ABNORMAL LOW (ref 4.22–5.81)
RDW: 24.7 % — ABNORMAL HIGH (ref 11.5–15.5)
WBC: 11.2 10*3/uL — ABNORMAL HIGH (ref 4.0–10.5)
nRBC: 0.6 % — ABNORMAL HIGH (ref 0.0–0.2)

## 2023-06-29 LAB — BASIC METABOLIC PANEL
Anion gap: 8 (ref 5–15)
BUN: 32 mg/dL — ABNORMAL HIGH (ref 8–23)
CO2: 26 mmol/L (ref 22–32)
Calcium: 7.4 mg/dL — ABNORMAL LOW (ref 8.9–10.3)
Chloride: 93 mmol/L — ABNORMAL LOW (ref 98–111)
Creatinine, Ser: 1.56 mg/dL — ABNORMAL HIGH (ref 0.61–1.24)
GFR, Estimated: 44 mL/min — ABNORMAL LOW (ref >60)
Glucose, Bld: 303 mg/dL — ABNORMAL HIGH (ref 70–99)
Potassium: 3.8 mmol/L (ref 3.5–5.1)
Sodium: 127 mmol/L — ABNORMAL LOW (ref 135–145)

## 2023-06-29 LAB — LACTIC ACID, PLASMA
Lactic Acid, Venous: 0.9 mmol/L (ref 0.5–1.9)
Lactic Acid, Venous: 0.9 mmol/L (ref 0.5–1.9)

## 2023-06-29 LAB — MAGNESIUM: Magnesium: 2 mg/dL (ref 1.7–2.4)

## 2023-06-29 MED ORDER — TEMAZEPAM 7.5 MG PO CAPS
7.5000 mg | ORAL_CAPSULE | Freq: Once | ORAL | Status: AC
  Administered 2023-06-29: 7.5 mg via ORAL
  Filled 2023-06-29: qty 1

## 2023-06-29 MED ORDER — MIDODRINE HCL 5 MG PO TABS
5.0000 mg | ORAL_TABLET | Freq: Three times a day (TID) | ORAL | Status: DC
  Administered 2023-06-29: 5 mg via ORAL
  Filled 2023-06-29: qty 1

## 2023-06-29 MED ORDER — HYDROCORTISONE SOD SUC (PF) 100 MG IJ SOLR
100.0000 mg | Freq: Three times a day (TID) | INTRAMUSCULAR | Status: DC
  Administered 2023-06-29: 100 mg via INTRAVENOUS
  Filled 2023-06-29: qty 2

## 2023-06-29 MED ORDER — ALTEPLASE 2 MG IJ SOLR
2.0000 mg | Freq: Once | INTRAMUSCULAR | Status: AC | PRN
  Administered 2023-06-29: 2 mg
  Filled 2023-06-29: qty 2

## 2023-06-29 MED ORDER — ALTEPLASE 2 MG IJ SOLR
2.0000 mg | Freq: Once | INTRAMUSCULAR | Status: AC
  Administered 2023-06-30: 2 mg
  Filled 2023-06-29 (×2): qty 2

## 2023-06-29 MED ORDER — MILRINONE LACTATE IN DEXTROSE 20-5 MG/100ML-% IV SOLN
0.1250 ug/kg/min | INTRAVENOUS | Status: DC
  Administered 2023-06-29: 0.125 ug/kg/min via INTRAVENOUS
  Filled 2023-06-29: qty 100

## 2023-06-29 MED ORDER — STERILE WATER FOR INJECTION IJ SOLN
INTRAMUSCULAR | Status: AC
  Filled 2023-06-29: qty 10

## 2023-06-29 MED ORDER — STERILE WATER FOR INJECTION IJ SOLN
10.0000 mL | Freq: Once | INTRAMUSCULAR | Status: AC
  Administered 2023-06-30: 10 mL via INTRAMUSCULAR
  Filled 2023-06-29: qty 10

## 2023-06-29 NOTE — Assessment & Plan Note (Addendum)
Echocardiogram with reduced LV systolic function EF 30 to 35%, RV mild enlargement, positive pulmonary artery hypertension, RVSP 42.1 mmHg, moderate mitral valve regurgitation, mild to moderate  mitral stenosis, moderate aortic valve stenosis.   Urine output is 850 ml Systolic blood pressure 100 to 96 mmHg.  CVP 15   Plan to increase furosemide drip to 15 mg/hr Continue norepinephrine for vasopressor therapy. Add milrinone.  Check SV02 and lactate.  If no improvement patient will need to be transferred to [facility] for advance heart failure.

## 2023-06-29 NOTE — Progress Notes (Signed)
[name] confusion with mild improvement from this morning.  BP (!) 102/53   Pulse (!) 42   Temp (!) 97.4 F (36.3 C) (Axillary)   Resp (!) 30   Ht 5\' 11"  (1.803 m)   Wt 96.5 kg   SpO2 95%   BMI 29.67 kg/m   SV02 68.4, Lactic acid 0.9  No significant urine output.  Plan to continue wean off norepinephrine and continue low dose milrinone.  Continue furosemide drip.  Follow up chest radiograph tomorrow.     I spoke with his wife about patient on milrinone and norepinephrine, along with furosemide drip.  This afternoon he continues to be congested with lowe urine output than expected.  Poor prognosis.   She would like to keep patient locally in [place] for conservative medical therapy. His health has been progressively declining, with multiple hospitalizations, for heart failure and anemia. Poor quality of life.  If he continues to deteriorate with medical therapy, we discussed the plan for transitioning to comfort care. She is in agreement.

## 2023-06-29 NOTE — Progress Notes (Signed)
Patient having increased WOB, tachypneic, and increased HR and was continuing to desat.  Slight wheeze audible and patient diminished especially in bases.  Placed on Bipap.  Patient appears more relaxed, and not be struggling as before; less agitated.

## 2023-06-29 NOTE — Assessment & Plan Note (Signed)
Chronic anemia.   Follow up Hgb is 7,3 with reactive leukocytosis at 11.2  If persistent anemia may need PRBC transfusion.   Outpatient records personally reviewed, patient having outpatient PRBC and getting ESA.

## 2023-06-29 NOTE — Assessment & Plan Note (Signed)
Continue norepinephrine for blood pressure support.

## 2023-06-29 NOTE — Assessment & Plan Note (Signed)
CKD stage 3a. Hyponatremia,   Follow up renal function with serum cr at 1,50 with K at 3,9 and serum bicarbonate at 28. Na 130.   Plan to continue hemodynamic support and diuresis.

## 2023-06-29 NOTE — Assessment & Plan Note (Deleted)
Follow up Hgb is 7,3 with reactive leukocytosis at 11.2  If persistent anemia may need PRBC transfusion.

## 2023-06-29 NOTE — Assessment & Plan Note (Signed)
Atrial fibrillation with rate control.

## 2023-06-29 NOTE — Assessment & Plan Note (Signed)
No signs of acute bleeding.  Continue PPI

## 2023-06-29 NOTE — Hospital Course (Signed)
Leonard Cisneros was admitted to the hospital with the working diagnosis of heart failure exacerbation.   82 y.o. male with medical history significant of EF=30% months ago, CAD, h/o CABG, afib, mild AS and mild Mitral stenosis.  His last cath was 09/2022., h/o myelodysplastic syndrome after a bone marrow biopsy in July 2024  and on Aranesp 500 mcg every 2 weeks, but also has history of duodenal ulcer,  internal hemorrhoids, presents with generalized weakness, sob.   Patient developed worsening heart failure with signs of low output and shock.   Patient was placed on IV furosemide for diuresis, required norepinephrine for hypotension.   Patient continue to deteriorate despite aggressive medical therapy. Palliative care was consulted and code status was addresses. DNR His wife was called and informed about critical condition.  10/27 patient had PEA cardiac arrest.

## 2023-06-29 NOTE — Progress Notes (Addendum)
Progress Note   Patient: Leonard Cisneros:[id number] DOB: 12-28-1940 DOA: 06/25/2023     5 DOS: the patient was seen and examined on 06/29/2023   Brief hospital course: Leonard Cisneros was admitted to the hospital with the working diagnosis of heart failure exacerbation.   82 y.o. male with medical history significant of EF=30% months ago, CAD, h/o CABG, afib, mild AS and mild Mitral stenosis.  His last cath was 09/2022., h/o myelodysplastic syndrome after a bone marrow biopsy in July 2024  and on Aranesp 500 mcg every 2 weeks, but also has history of duodenal ulcer,  internal hemorrhoids, presents with generalized weakness, sob.    Patient was placed on IV furosemide for diuresis, required norepinephrine for hypotension.   Assessment and Plan: * Acute on chronic systolic CHF (congestive heart failure) (HCC) Echocardiogram with reduced LV systolic function EF 30 to 35%, RV mild enlargement, positive pulmonary artery hypertension, RVSP 42.1 mmHg, moderate mitral valve regurgitation, mild to moderate  mitral stenosis, moderate aortic valve stenosis.   Urine output is 850 ml Systolic blood pressure 100 to 96 mmHg.  CVP 15   Plan to increase furosemide drip to 15 mg/hr Continue norepinephrine for vasopressor therapy. Add milrinone.  Check SV02 and lactate.  If no improvement patient will need to be transferred to [facility] for advance heart failure.   Essential hypertension Continue norepinephrine for blood pressure support.   Atrial fibrillation with RVR (HCC) Atrial fibrillation with rate control.   AKI (acute kidney injury) (HCC) CKD stage 3a. Hyponatremia,   Follow up renal function with serum cr at 1,50 with K at 3,9 and serum bicarbonate at 28. Na 130.   Plan to continue hemodynamic support and diuresis.   Duodenal ulcer No signs of acute bleeding.  Continue PPI  MDS (myelodysplastic syndrome) (HCC) Chronic anemia.   Follow up Hgb is 7,3 with reactive leukocytosis at  11.2  If persistent anemia may need PRBC transfusion.   Outpatient records personally reviewed, patient having outpatient PRBC and getting ESA.          Subjective: Patient has been confused today, he thinks it [birth year] his birthday, denies any chest pain or dyspnea   Physical Exam: Vitals:   06/29/23 0618 06/29/23 0755 06/29/23 0800 06/29/23 0834  BP:   (!) 102/53   Pulse: (!) 105  (!) 42   Resp: (!) 22  (!) 30   Temp:  98.6 F (37 C)    TempSrc:  Oral    SpO2: 95%  97% 95%  Weight:      Height:       Neurology awake and alert, disorientated in time but not in person. No agitation,  ENT with mild pallor with no icterus Cardiovascular with S1 and S2 present, irregularly irregular with no gallops, or rubs, positive systolic murmur at the apex,  Moderate JVD Respiratory with rales at bases with no wheezing or rhonchi Abdomen with no distention  Lower extremities with pitting edema, unna boots in place.  Data Reviewed:    Family Communication: no family at the bedside  Disposition: Status is: Inpatient Remains inpatient appropriate because: heart failure   Planned Discharge Destination: Home    Critical care time 74 minutes. Patient critically ill on vasopressors.  Author: Coralie Keens, MD 06/29/2023 10:03 AM  For on call review [email or web address].

## 2023-06-30 ENCOUNTER — Inpatient Hospital Stay (HOSPITAL_COMMUNITY): Payer: Medicare HMO

## 2023-07-01 ENCOUNTER — Other Ambulatory Visit: Payer: Medicare HMO

## 2023-07-01 ENCOUNTER — Ambulatory Visit: Payer: Medicare HMO

## 2023-07-05 NOTE — Progress Notes (Signed)
Patient BP dropping and MAP been below 50, not able to maintain systolic above 90s despite high pressors support, on call cardio made aware, no intervention ordered, Midodrine ordered by night hospitalist, solu-cortef given, no improvements noted, still titrating the levophed up, no significant changes, no measurable urine output in the foley, will continue to monitor.

## 2023-07-05 NOTE — Progress Notes (Signed)
Patient started desatting and had a shallow gasping breathing, and this RN was not able to get his oral or axillary temperature, so asked for a help with other RN to take his rectal temperature, when laying him down on bed to take his rectal temperature, he started desatting more to 60s and less,   BP dropped down even more to 70s systolic, put him on NRBs, put his HOB elevated back on, MD made aware, wife called and updated, and she said she will be  here shortl and verbalised aggreement to comfort care, wife already signed a form for comfort care on 06/29/23, no information for active comfort care neither orders for comfort care present in the chart,  patient declined very rapidly afterwards, went asystole with infrequent PEAs, and death pronounced at 0301 with RN Florentina Addison and RN Lawanna Kobus and the primary RN herself, MD and [facility]  made aware, [facility] called, eligible for tissue donation, patient placement called, wife waiting for other family members and wanting to wait until 6 in the morning till her daughter arrive, will continue to monitor.

## 2023-07-05 NOTE — Progress Notes (Signed)
All indwelling devices removed and Identification tags placed. Removed all linen and pads. Placed in body bag and called Security for transport. NT went with Security to log patient in morgue.

## 2023-07-05 NOTE — Death Summary Note (Signed)
effusion. Vascular congestion and edema in the visualized portions of the aerated right lung. Electronically  Signed   By: Jasmine Pang M.D.   On: 06/20/2023 01:50   DG CHEST PORT 1 VIEW  Result Date: 06/29/2023 CLINICAL DATA:  Dyspnea EXAM: PORTABLE CHEST 1 VIEW COMPARISON:  06/25/2023, 06/24/2023 FINDINGS: Post sternotomy changes. Cardiomegaly with vascular congestion and pulmonary edema. Moderate bilateral effusions appear mildly grossly stable. Persistent bibasilar consolidations. Right upper extremity central venous catheter tip at the cavoatrial region. IMPRESSION: Cardiomegaly with vascular congestion and pulmonary edema. Moderate bilateral effusions are probably grossly unchanged. Electronically Signed   By: Jasmine Pang M.D.   On: 06/29/2023 03:02   DG CHEST PORT 1 VIEW  Result Date: 06/25/2023 CLINICAL DATA:  PICC line placement EXAM: PORTABLE CHEST 1 VIEW COMPARISON:  06/24/2023 FINDINGS: Postoperative changes in the mediastinum. Cardiac enlargement with mild vascular congestion. Bilateral pleural effusions. Basilar infiltration or atelectasis, possibly edema. A right PICC line was placed. Tip projects over the cavoatrial junction region. No pneumothorax. Calcification of the aorta. IMPRESSION: 1. Right PICC line placed with tip projecting over the cavoatrial junction region. No pneumothorax. 2. Cardiac enlargement with basilar infiltration and pleural effusions, similar to prior study. Electronically Signed   By: Burman Nieves M.D.   On: 06/25/2023 21:12   ECHOCARDIOGRAM LIMITED  Result Date: 06/25/2023    ECHOCARDIOGRAM LIMITED REPORT   Patient Name:   Leonard Cisneros Date of Exam: 06/25/2023 Medical Rec #:  [id number]        Height:       71.0 in Accession #:    [id number]       Weight:       207.5 lb Date of Birth:  August 21, 1941       BSA:          2.142 m Patient Age:    81 years         BP:           97/66 mmHg Patient Gender: M                HR:           88 bpm. [facility] Procedure: Limited Echo and Limited Color Doppler Indications:    CHF I50.90  History:         Patient has prior history of Echocardiogram examinations, most                 recent 04/02/2023. CHF and Cardiomyopathy, CAD and Angina,                 Arrythmias:Atrial Fibrillation, Signs/Symptoms:Chest Pain and                 Shortness of Breath; Risk Factors:Hypertension and Dyslipidemia.  Sonographer:    Lucendia Herrlich RCS Referring Phys: [id number] Dorothe Pea BRANCH IMPRESSIONS  1. Left ventricular ejection fraction, by estimation, is 30 to 35%. The left ventricle has moderately decreased function. Left ventricular diastolic parameters are indeterminate.  2. RV not well visualized. Grossly appears mildly enlarged with mildly decreased function. . Right ventricular systolic function was not well visualized. The right ventricular size is not well visualized. There is mildly elevated pulmonary artery systolic pressure.  3. Moderate mitral valve regurgitation. Mild to moderate mitral stenosis. The mean mitral valve gradient is 6.0 mmHg. Moderate mitral annular calcification.  4. The aortic valve has an indeterminant number of cusps. There is severe calcifcation of the aortic valve. There is  effusion. Vascular congestion and edema in the visualized portions of the aerated right lung. Electronically  Signed   By: Jasmine Pang M.D.   On: 07/02/2023 01:50   DG CHEST PORT 1 VIEW  Result Date: 06/29/2023 CLINICAL DATA:  Dyspnea EXAM: PORTABLE CHEST 1 VIEW COMPARISON:  06/25/2023, 06/24/2023 FINDINGS: Post sternotomy changes. Cardiomegaly with vascular congestion and pulmonary edema. Moderate bilateral effusions appear mildly grossly stable. Persistent bibasilar consolidations. Right upper extremity central venous catheter tip at the cavoatrial region. IMPRESSION: Cardiomegaly with vascular congestion and pulmonary edema. Moderate bilateral effusions are probably grossly unchanged. Electronically Signed   By: Jasmine Pang M.D.   On: 06/29/2023 03:02   DG CHEST PORT 1 VIEW  Result Date: 06/25/2023 CLINICAL DATA:  PICC line placement EXAM: PORTABLE CHEST 1 VIEW COMPARISON:  06/24/2023 FINDINGS: Postoperative changes in the mediastinum. Cardiac enlargement with mild vascular congestion. Bilateral pleural effusions. Basilar infiltration or atelectasis, possibly edema. A right PICC line was placed. Tip projects over the cavoatrial junction region. No pneumothorax. Calcification of the aorta. IMPRESSION: 1. Right PICC line placed with tip projecting over the cavoatrial junction region. No pneumothorax. 2. Cardiac enlargement with basilar infiltration and pleural effusions, similar to prior study. Electronically Signed   By: Burman Nieves M.D.   On: 06/25/2023 21:12   ECHOCARDIOGRAM LIMITED  Result Date: 06/25/2023    ECHOCARDIOGRAM LIMITED REPORT   Patient Name:   Leonard Cisneros Date of Exam: 06/25/2023 Medical Rec #:  [id number]        Height:       71.0 in Accession #:    [id number]       Weight:       207.5 lb Date of Birth:  August 21, 1941       BSA:          2.142 m Patient Age:    81 years         BP:           97/66 mmHg Patient Gender: M                HR:           88 bpm. [facility] Procedure: Limited Echo and Limited Color Doppler Indications:    CHF I50.90  History:         Patient has prior history of Echocardiogram examinations, most                 recent 04/02/2023. CHF and Cardiomyopathy, CAD and Angina,                 Arrythmias:Atrial Fibrillation, Signs/Symptoms:Chest Pain and                 Shortness of Breath; Risk Factors:Hypertension and Dyslipidemia.  Sonographer:    Lucendia Herrlich RCS Referring Phys: [id number] Dorothe Pea BRANCH IMPRESSIONS  1. Left ventricular ejection fraction, by estimation, is 30 to 35%. The left ventricle has moderately decreased function. Left ventricular diastolic parameters are indeterminate.  2. RV not well visualized. Grossly appears mildly enlarged with mildly decreased function. . Right ventricular systolic function was not well visualized. The right ventricular size is not well visualized. There is mildly elevated pulmonary artery systolic pressure.  3. Moderate mitral valve regurgitation. Mild to moderate mitral stenosis. The mean mitral valve gradient is 6.0 mmHg. Moderate mitral annular calcification.  4. The aortic valve has an indeterminant number of cusps. There is severe calcifcation of the aortic valve. There is  effusion. Vascular congestion and edema in the visualized portions of the aerated right lung. Electronically  Signed   By: Jasmine Pang M.D.   On: 06/08/2023 01:50   DG CHEST PORT 1 VIEW  Result Date: 06/29/2023 CLINICAL DATA:  Dyspnea EXAM: PORTABLE CHEST 1 VIEW COMPARISON:  06/25/2023, 06/24/2023 FINDINGS: Post sternotomy changes. Cardiomegaly with vascular congestion and pulmonary edema. Moderate bilateral effusions appear mildly grossly stable. Persistent bibasilar consolidations. Right upper extremity central venous catheter tip at the cavoatrial region. IMPRESSION: Cardiomegaly with vascular congestion and pulmonary edema. Moderate bilateral effusions are probably grossly unchanged. Electronically Signed   By: Jasmine Pang M.D.   On: 06/29/2023 03:02   DG CHEST PORT 1 VIEW  Result Date: 06/25/2023 CLINICAL DATA:  PICC line placement EXAM: PORTABLE CHEST 1 VIEW COMPARISON:  06/24/2023 FINDINGS: Postoperative changes in the mediastinum. Cardiac enlargement with mild vascular congestion. Bilateral pleural effusions. Basilar infiltration or atelectasis, possibly edema. A right PICC line was placed. Tip projects over the cavoatrial junction region. No pneumothorax. Calcification of the aorta. IMPRESSION: 1. Right PICC line placed with tip projecting over the cavoatrial junction region. No pneumothorax. 2. Cardiac enlargement with basilar infiltration and pleural effusions, similar to prior study. Electronically Signed   By: Burman Nieves M.D.   On: 06/25/2023 21:12   ECHOCARDIOGRAM LIMITED  Result Date: 06/25/2023    ECHOCARDIOGRAM LIMITED REPORT   Patient Name:   Leonard Cisneros Date of Exam: 06/25/2023 Medical Rec #:  [id number]        Height:       71.0 in Accession #:    [id number]       Weight:       207.5 lb Date of Birth:  August 21, 1941       BSA:          2.142 m Patient Age:    81 years         BP:           97/66 mmHg Patient Gender: M                HR:           88 bpm. [facility] Procedure: Limited Echo and Limited Color Doppler Indications:    CHF I50.90  History:         Patient has prior history of Echocardiogram examinations, most                 recent 04/02/2023. CHF and Cardiomyopathy, CAD and Angina,                 Arrythmias:Atrial Fibrillation, Signs/Symptoms:Chest Pain and                 Shortness of Breath; Risk Factors:Hypertension and Dyslipidemia.  Sonographer:    Lucendia Herrlich RCS Referring Phys: [id number] Dorothe Pea BRANCH IMPRESSIONS  1. Left ventricular ejection fraction, by estimation, is 30 to 35%. The left ventricle has moderately decreased function. Left ventricular diastolic parameters are indeterminate.  2. RV not well visualized. Grossly appears mildly enlarged with mildly decreased function. . Right ventricular systolic function was not well visualized. The right ventricular size is not well visualized. There is mildly elevated pulmonary artery systolic pressure.  3. Moderate mitral valve regurgitation. Mild to moderate mitral stenosis. The mean mitral valve gradient is 6.0 mmHg. Moderate mitral annular calcification.  4. The aortic valve has an indeterminant number of cusps. There is severe calcifcation of the aortic valve. There is  DEATH SUMMARY   Patient Details  Name: Leonard Cisneros MRN: [id number] DOB: 05/16/1941 WUJ:[id number], Malachi Bonds, FNP Admission/Discharge Information   Admit Date:  11-Jul-2023  Date of Death: Date of Death: 07/17/2023  Time of Death: Time of Death: 0301  Length of Stay: 6   Principle Cause of death: heart failure with cardiogenic shock.   Hospital Diagnoses: Principal Problem:   Acute on chronic systolic CHF (congestive heart failure) (HCC) Active Problems:   Essential hypertension   Atrial fibrillation with RVR (HCC)   AKI (acute kidney injury) (HCC)   Duodenal ulcer   MDS (myelodysplastic syndrome) [facility])   Hospital Course: Leonard Cisneros was admitted to the hospital with the working diagnosis of heart failure exacerbation.   81 y.o. male with medical history significant of EF=30% months ago, CAD, h/o CABG, afib, mild AS and mild Mitral stenosis.  His last cath was 09/2022., h/o myelodysplastic syndrome after a bone marrow biopsy in July 2024  and on Aranesp 500 mcg every 2 weeks, but also has history of duodenal ulcer,  internal hemorrhoids, presents with generalized weakness, sob.   Patient developed worsening heart failure with signs of low output and shock.   Patient was placed on IV furosemide for diuresis, required norepinephrine for hypotension.   Patient continue to deteriorate despite aggressive medical therapy. Palliative care was consulted and code status was addresses. DNR His wife was called and informed about critical condition.  July 17, 2023 patient had PEA cardiac arrest.      Assessment and Plan: * Acute on chronic systolic CHF (congestive heart failure) (HCC) Echocardiogram with reduced LV systolic function EF 30 to 35%, RV mild enlargement, positive pulmonary artery hypertension, RVSP 42.1 mmHg, moderate mitral valve regurgitation, mild to moderate  mitral stenosis, moderate aortic valve stenosis.   Urine output is 850 ml Systolic blood pressure 100 to 96  mmHg.  CVP 15   Plan to increase furosemide drip to 15 mg/hr Continue norepinephrine for vasopressor therapy. Add milrinone.  Check SV02 and lactate.   Patient did not respond to the aggressive medical interventions.   Essential hypertension Continue norepinephrine for blood pressure support.   Atrial fibrillation with RVR (HCC) Atrial fibrillation with rate control.   AKI (acute kidney injury) (HCC) CKD stage 3a. Hyponatremia,   Follow up renal function with serum cr at 1,50 with K at 3,9 and serum bicarbonate at 28. Na 130.   Plan to continue hemodynamic support and diuresis.   Duodenal ulcer No signs of acute bleeding.  Continue PPI  MDS (myelodysplastic syndrome) (HCC) Chronic anemia.   Follow up Hgb is 7,3 with reactive leukocytosis at 11.2  If persistent anemia may need PRBC transfusion.   Outpatient records personally reviewed, patient having outpatient PRBC and getting ESA.      The results of significant diagnostics from this hospitalization (including imaging, microbiology, ancillary and laboratory) are listed below for reference.   Significant Diagnostic Studies: DG Chest 1 View  Result Date: 07-17-2023 CLINICAL DATA:  Follow-up exam EXAM: CHEST  1 VIEW COMPARISON:  06/29/2023 FINDINGS: Right upper extremity central venous catheter tip poorly visible tip probably in the cavoatrial region. Interval complete opacification of left thorax probably due to combination of large effusion and underlying airspace disease. Increased moderate right effusion. Obscured cardiomediastinal silhouette. Vascular congestion and edema in the right aerated lung. Basilar consolidations. Post sternotomy changes IMPRESSION: 1. Interval complete opacification of left thorax probably due to combination of large effusion and underlying airspace disease. 2. Increased moderate right  effusion. Vascular congestion and edema in the visualized portions of the aerated right lung. Electronically  Signed   By: Jasmine Pang M.D.   On: 06/06/2023 01:50   DG CHEST PORT 1 VIEW  Result Date: 06/29/2023 CLINICAL DATA:  Dyspnea EXAM: PORTABLE CHEST 1 VIEW COMPARISON:  06/25/2023, 06/24/2023 FINDINGS: Post sternotomy changes. Cardiomegaly with vascular congestion and pulmonary edema. Moderate bilateral effusions appear mildly grossly stable. Persistent bibasilar consolidations. Right upper extremity central venous catheter tip at the cavoatrial region. IMPRESSION: Cardiomegaly with vascular congestion and pulmonary edema. Moderate bilateral effusions are probably grossly unchanged. Electronically Signed   By: Jasmine Pang M.D.   On: 06/29/2023 03:02   DG CHEST PORT 1 VIEW  Result Date: 06/25/2023 CLINICAL DATA:  PICC line placement EXAM: PORTABLE CHEST 1 VIEW COMPARISON:  06/24/2023 FINDINGS: Postoperative changes in the mediastinum. Cardiac enlargement with mild vascular congestion. Bilateral pleural effusions. Basilar infiltration or atelectasis, possibly edema. A right PICC line was placed. Tip projects over the cavoatrial junction region. No pneumothorax. Calcification of the aorta. IMPRESSION: 1. Right PICC line placed with tip projecting over the cavoatrial junction region. No pneumothorax. 2. Cardiac enlargement with basilar infiltration and pleural effusions, similar to prior study. Electronically Signed   By: Burman Nieves M.D.   On: 06/25/2023 21:12   ECHOCARDIOGRAM LIMITED  Result Date: 06/25/2023    ECHOCARDIOGRAM LIMITED REPORT   Patient Name:   Leonard Cisneros Date of Exam: 06/25/2023 Medical Rec #:  [id number]        Height:       71.0 in Accession #:    [id number]       Weight:       207.5 lb Date of Birth:  August 21, 1941       BSA:          2.142 m Patient Age:    81 years         BP:           97/66 mmHg Patient Gender: M                HR:           88 bpm. [facility] Procedure: Limited Echo and Limited Color Doppler Indications:    CHF I50.90  History:         Patient has prior history of Echocardiogram examinations, most                 recent 04/02/2023. CHF and Cardiomyopathy, CAD and Angina,                 Arrythmias:Atrial Fibrillation, Signs/Symptoms:Chest Pain and                 Shortness of Breath; Risk Factors:Hypertension and Dyslipidemia.  Sonographer:    Lucendia Herrlich RCS Referring Phys: [id number] Dorothe Pea BRANCH IMPRESSIONS  1. Left ventricular ejection fraction, by estimation, is 30 to 35%. The left ventricle has moderately decreased function. Left ventricular diastolic parameters are indeterminate.  2. RV not well visualized. Grossly appears mildly enlarged with mildly decreased function. . Right ventricular systolic function was not well visualized. The right ventricular size is not well visualized. There is mildly elevated pulmonary artery systolic pressure.  3. Moderate mitral valve regurgitation. Mild to moderate mitral stenosis. The mean mitral valve gradient is 6.0 mmHg. Moderate mitral annular calcification.  4. The aortic valve has an indeterminant number of cusps. There is severe calcifcation of the aortic valve. There is

## 2023-07-05 DEATH — deceased

## 2023-07-15 ENCOUNTER — Ambulatory Visit: Payer: Medicare HMO

## 2023-07-15 ENCOUNTER — Ambulatory Visit: Payer: Medicare HMO | Admitting: Hematology

## 2023-07-15 ENCOUNTER — Other Ambulatory Visit: Payer: Medicare HMO

## 2023-07-29 ENCOUNTER — Other Ambulatory Visit: Payer: Medicare HMO

## 2023-08-30 ENCOUNTER — Ambulatory Visit: Payer: Medicare HMO | Admitting: Student
